# Patient Record
Sex: Male | Born: 1955
Health system: Southern US, Community
[De-identification: ages and names within clinical notes are randomized; demographics above are authoritative.]

## PROBLEM LIST (undated history)

## (undated) ENCOUNTER — Emergency Department (HOSPITAL_COMMUNITY): Admission: EM | Payer: No Typology Code available for payment source

## (undated) DIAGNOSIS — I509 Heart failure, unspecified: Secondary | ICD-10-CM

## (undated) DIAGNOSIS — E785 Hyperlipidemia, unspecified: Secondary | ICD-10-CM

## (undated) DIAGNOSIS — N183 Chronic kidney disease, stage 3 unspecified: Secondary | ICD-10-CM

## (undated) DIAGNOSIS — F1410 Cocaine abuse, uncomplicated: Secondary | ICD-10-CM

## (undated) DIAGNOSIS — Z89519 Acquired absence of unspecified leg below knee: Secondary | ICD-10-CM

## (undated) DIAGNOSIS — Z5900 Homelessness unspecified: Secondary | ICD-10-CM

## (undated) DIAGNOSIS — Z973 Presence of spectacles and contact lenses: Secondary | ICD-10-CM

## (undated) DIAGNOSIS — I639 Cerebral infarction, unspecified: Secondary | ICD-10-CM

## (undated) DIAGNOSIS — Z720 Tobacco use: Secondary | ICD-10-CM

## (undated) DIAGNOSIS — T8781 Dehiscence of amputation stump: Secondary | ICD-10-CM

## (undated) DIAGNOSIS — E119 Type 2 diabetes mellitus without complications: Secondary | ICD-10-CM

## (undated) DIAGNOSIS — H409 Unspecified glaucoma: Secondary | ICD-10-CM

## (undated) DIAGNOSIS — S78119A Complete traumatic amputation at level between unspecified hip and knee, initial encounter: Secondary | ICD-10-CM

## (undated) DIAGNOSIS — F1210 Cannabis abuse, uncomplicated: Secondary | ICD-10-CM

## (undated) DIAGNOSIS — I10 Essential (primary) hypertension: Secondary | ICD-10-CM

## (undated) DIAGNOSIS — I5030 Unspecified diastolic (congestive) heart failure: Secondary | ICD-10-CM

## (undated) DIAGNOSIS — M869 Osteomyelitis, unspecified: Secondary | ICD-10-CM

## (undated) HISTORY — DX: Hyperlipidemia, unspecified: E78.5

## (undated) HISTORY — DX: Unspecified glaucoma: H40.9

## (undated) HISTORY — PX: NO PAST SURGERIES: SHX2092

---

## 1997-11-05 DIAGNOSIS — E119 Type 2 diabetes mellitus without complications: Secondary | ICD-10-CM

## 1997-11-05 HISTORY — DX: Type 2 diabetes mellitus without complications: E11.9

## 2003-11-06 DIAGNOSIS — I10 Essential (primary) hypertension: Secondary | ICD-10-CM

## 2003-11-06 HISTORY — DX: Essential (primary) hypertension: I10

## 2013-11-05 DIAGNOSIS — H409 Unspecified glaucoma: Secondary | ICD-10-CM

## 2013-11-05 HISTORY — DX: Unspecified glaucoma: H40.9

## 2018-09-12 ENCOUNTER — Encounter (HOSPITAL_COMMUNITY): Payer: Self-pay | Admitting: Emergency Medicine

## 2018-09-12 ENCOUNTER — Ambulatory Visit (HOSPITAL_COMMUNITY)
Admission: EM | Admit: 2018-09-12 | Discharge: 2018-09-12 | Disposition: A | Discharge status: Home / Self Care | Payer: Self-pay | Attending: Family Medicine | Admitting: Family Medicine

## 2018-09-12 DIAGNOSIS — I10 Essential (primary) hypertension: Secondary | ICD-10-CM

## 2018-09-12 DIAGNOSIS — E1165 Type 2 diabetes mellitus with hyperglycemia: Secondary | ICD-10-CM

## 2018-09-12 DIAGNOSIS — L03119 Cellulitis of unspecified part of limb: Secondary | ICD-10-CM

## 2018-09-12 DIAGNOSIS — L02419 Cutaneous abscess of limb, unspecified: Secondary | ICD-10-CM

## 2018-09-12 HISTORY — DX: Type 2 diabetes mellitus without complications: E11.9

## 2018-09-12 HISTORY — DX: Essential (primary) hypertension: I10

## 2018-09-12 MED ORDER — ENALAPRIL MALEATE 10 MG PO TABS: 10.0000 mg | ORAL_TABLET | Freq: Every day | ORAL | 1 refills | Status: DC

## 2018-09-12 MED ORDER — DOXYCYCLINE HYCLATE 100 MG PO TABS: 100.0000 mg | ORAL_TABLET | Freq: Two times a day (BID) | ORAL | 0 refills | Status: DC

## 2018-09-12 MED ORDER — DOXYCYCLINE HYCLATE 100 MG PO TABS
ORAL_TABLET | ORAL | Status: AC
  Filled 2018-09-12: qty 1

## 2018-09-12 MED ORDER — METFORMIN HCL 500 MG PO TABS: 500.0000 mg | ORAL_TABLET | Freq: Every day | ORAL | 1 refills | Status: DC

## 2018-09-12 MED ORDER — DOXYCYCLINE HYCLATE 100 MG PO TABS
100.0000 mg | ORAL_TABLET | Freq: Once | ORAL | Status: AC
  Administered 2018-09-12: 100 mg via ORAL

## 2018-09-12 MED ORDER — GLIPIZIDE 5 MG PO TABS: 5.0000 mg | ORAL_TABLET | Freq: Every day | ORAL | 1 refills | Status: DC

## 2018-09-12 NOTE — Discharge Instructions (Addendum)
Wash the leg with soap and water twice a day.  [employer] is agency to help find a job  [employer] on [address] [place] helps people [employer])

## 2018-09-12 NOTE — ED Triage Notes (Signed)
Pt sts insect bite to right leg with increased swelling and pain; pt sts out of DM and htn meds and no PCP

## 2018-09-12 NOTE — ED Provider Notes (Signed)
[facility]    CSN: [id number] Arrival date & time: 09/12/18  1620     History   Chief Complaint Chief Complaint  Patient presents with  . Insect Bite    HPI Scott Greer is a 62 y.o. male.   Initial visit for this 62 yo man.  Pt sts insect bite to right leg with increased swelling and pain; pt sts out of DM and htn meds and no PCP  This 62 year old man was incarcerated and recently discharged from prison.  He only had $45 when he left the prison.  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  He needs medicine for his diabetes and hypertension.  About a week ago he developed an abscess on the lateral aspect of his right thigh and its gotten so that he cannot work because the pain is so great.     Past Medical History:  Diagnosis Date  . Diabetes mellitus without complication (HCC)   . Hypertension     There are no active problems to display for this patient.   History reviewed. No pertinent surgical history.     Home Medications    Prior to Admission medications   Not on File    Family History History reviewed. No pertinent family history.  Social History Social History   Tobacco Use  . Smoking status: Current Every Day Smoker    Types: Cigars  . Smokeless tobacco: Never Used  Substance Use Topics  . Alcohol use: Yes  . Drug use: Not Currently     Allergies   Bee venom   Review of Systems Review of Systems   Physical Exam Triage Vital Signs ED Triage Vitals [09/12/18 1636]  Enc Vitals Group     BP (!) 201/103     Pulse Rate 95     Resp 18     Temp 98.1 F (36.7 C)     Temp Source Oral     SpO2 95 %     Weight      Height      Head Circumference      Peak Flow      Pain Score 6     Pain Loc      Pain Edu?      Excl. in [place]?    No data found.  Updated Vital Signs BP (!) 201/103 (BP Location: Right Arm)   Pulse 95   Temp 98.1 F (36.7 C) (Oral)   Resp 18   SpO2  95%    Physical Exam  Constitutional: He is oriented to person, place, and time. He appears well-developed and well-nourished.  Eyes:  Injected conjunctiva  Neck: Normal range of motion. Neck supple.  Pulmonary/Chest: Effort normal.  Musculoskeletal: Normal range of motion.  Neurological: He is alert and oriented to person, place, and time.  Skin:  2 cm right lateral thigh tender and fluctuant mass  Nursing note and vitals reviewed.    UC Treatments / Results  Labs (all labs ordered are listed, but only abnormal results are displayed) Labs Reviewed - No data to display  EKG None  Radiology No results found.  Procedures Incision and Drainage Date/Time: 09/12/2018 5:34 PM Performed by: Elvina Sidle, MD Authorized by: Elvina Sidle, MD   Consent:    Consent obtained:  Verbal   Consent given by:  Patient   Risks discussed:  Infection   Alternatives discussed:  Delayed treatment Location:  Type:  Abscess   Location:  Lower extremity   Lower extremity location:  Leg   Leg location:  R upper leg Pre-procedure details:    Skin preparation:  Betadine Anesthesia (see MAR for exact dosages):    Anesthesia method:  Local infiltration   Local anesthetic:  Lidocaine 2% w/o epi Procedure type:    Complexity:  Simple Procedure details:    Needle aspiration: no     Incision types:  Stab incision   Incision depth:  Subcutaneous   Scalpel blade:  11   Wound management:  Probed and deloculated   Drainage:  Purulent   Drainage amount:  Moderate   Wound treatment:  Wound left open   Packing materials:  None Post-procedure details:    Patient tolerance of procedure:  Tolerated well, no immediate complications   (including critical care time)  Medications Ordered in UC Medications - No data to display  Initial Impression / Assessment and Plan / UC Course  I have reviewed the triage vital signs and the nursing notes.  Pertinent labs & imaging results that were  available during my care of the patient were reviewed by me and considered in my medical decision making (see chart for details).    Final Clinical Impressions(s) / UC Diagnoses   Final diagnoses:  None   Discharge Instructions   None    ED Prescriptions    None     Controlled Substance Prescriptions [place] Controlled Substance Registry consulted? Not Applicable   Elvina Sidle, MD 09/12/18 1736

## 2018-09-22 ENCOUNTER — Ambulatory Visit: Payer: Self-pay | Admitting: Internal Medicine

## 2018-09-23 ENCOUNTER — Ambulatory Visit: Payer: Self-pay | Admitting: Internal Medicine

## 2018-09-23 ENCOUNTER — Encounter: Payer: Self-pay | Admitting: Internal Medicine

## 2018-09-23 VITALS — BP 180/98 | HR 88 | Resp 12 | Ht 72.0 in | Wt 234.0 lb

## 2018-09-23 DIAGNOSIS — E785 Hyperlipidemia, unspecified: Secondary | ICD-10-CM | POA: Insufficient documentation

## 2018-09-23 DIAGNOSIS — Z9189 Other specified personal risk factors, not elsewhere classified: Secondary | ICD-10-CM

## 2018-09-23 DIAGNOSIS — I10 Essential (primary) hypertension: Secondary | ICD-10-CM

## 2018-09-23 DIAGNOSIS — E1169 Type 2 diabetes mellitus with other specified complication: Secondary | ICD-10-CM

## 2018-09-23 DIAGNOSIS — Z23 Encounter for immunization: Secondary | ICD-10-CM

## 2018-09-23 DIAGNOSIS — H409 Unspecified glaucoma: Secondary | ICD-10-CM | POA: Insufficient documentation

## 2018-09-23 DIAGNOSIS — E782 Mixed hyperlipidemia: Secondary | ICD-10-CM

## 2018-09-23 DIAGNOSIS — E119 Type 2 diabetes mellitus without complications: Secondary | ICD-10-CM

## 2018-09-23 LAB — GLUCOSE, POCT (MANUAL RESULT ENTRY): POC Glucose: 261 mg/dl — AB (ref 70–99)

## 2018-09-23 MED ORDER — GLUCOSE BLOOD VI STRP: ORAL_STRIP | 12 refills | Status: DC

## 2018-09-23 MED ORDER — AGAMATRIX ULTRA-THIN LANCETS MISC: 11 refills | Status: DC

## 2018-09-23 MED ORDER — AGAMATRIX PRESTO W/DEVICE KIT: PACK | 0 refills | Status: DC

## 2018-09-23 MED ORDER — GLIPIZIDE 5 MG PO TABS: ORAL_TABLET | ORAL | 11 refills | Status: DC

## 2018-09-23 MED ORDER — LISINOPRIL 10 MG PO TABS: 10.0000 mg | ORAL_TABLET | Freq: Every day | ORAL | 11 refills | Status: DC

## 2018-09-23 MED ORDER — METFORMIN HCL 500 MG PO TABS: ORAL_TABLET | ORAL | 11 refills | Status: DC

## 2018-09-23 NOTE — Patient Instructions (Addendum)
Drink a glass of water before every meal Drink 6-8 glasses of water daily Eat three meals daily Eat a protein and healthy fat with every meal (eggs,fish, chicken, [place] and limit red meats) Eat 5 servings of vegetables daily, mix the colors Eat 2 servings of fruit daily with skin, if skin is edible Use smaller plates Put food/utensils down as you chew and swallow each bite Eat at a table with friends/family at least once daily, no TV Do not eat in front of the TV  Recent studies show that people who consume all of their calories in a 12 hour period lose weight more efficiently.  For example, if you eat your first meal at 7:00 a.m., your last meal of the day should be completed by 7:00 p.m.  Call in Glaucoma medication tomorrow or Thursday

## 2018-09-23 NOTE — Progress Notes (Signed)
Subjective:    Patient ID: Scott Greer, male    DOB: 03-05-1956, 62 y.o.   MRN: [id number]  HPI   Here to establish  1.  Abscess on posterior right thigh I & Ded in Urgent Care on 09/12/2018. He was taking Doxycycline until about 2 days ago.  Thinks he was bit by a spider and became secondarily infected.  States he is healing well.    2. DM:  Diagnosed in 1999.  Took Metformin and Glipizide in prison.  He is not certain what his sugar control was when in prison.   Denies any complication from the diabetes, though may have developed peripheral neuropathy.  3.  Hypertension:  Diagnosed a few years after diagnosed with DM.  Was taking Enalapril 10 mg daily and feels his bp is under control.    4.  Hypercholesterolemia:  Was on medication in the past.  Thinks he was on Lipitor.  States they took him off as it caused him to urinate too frequently.  Has been off all meds for 2 months.   5.  ED:  Has been out of prison for 2 months.  He states he has had difficulty with erection.  Unable to get him to clarify how affected his has been.  He has been able to ejaculate..  Current Meds  Medication Sig  . glipiZIDE (GLUCOTROL) 5 MG tablet 1/2 tab by mouth twice daily with meals  . metFORMIN (GLUCOPHAGE) 500 MG tablet 1 tab by mouth twice daily with meals  . [DISCONTINUED] doxycycline (VIBRA-TABS) 100 MG tablet Take 1 tablet (100 mg total) by mouth 2 (two) times daily.  . [DISCONTINUED] glipiZIDE (GLUCOTROL) 5 MG tablet Take 1 tablet (5 mg total) by mouth daily before breakfast.  . [DISCONTINUED] metFORMIN (GLUCOPHAGE) 500 MG tablet Take 1 tablet (500 mg total) by mouth at bedtime for 14 days.   Allergies  Allergen Reactions  . Bee Venom     Past Medical History:  Diagnosis Date  . Diabetes mellitus without complication (HCC) 1999  . Glaucoma 2015  . Hyperlipidemia   . Hypertension 2005    History reviewed. No pertinent surgical history.   Family History  Problem Relation Age of  Onset  . Stroke Mother   . Diabetes Mother        Toward end of life    Social History   Socioeconomic History  . Marital status: Married    Spouse name: [name]  . Number of children: 2  . Years of education: Not on file  . Highest education level: Associate degree: occupational, Scientist, product/process development, or vocational program  Occupational History  . Not on file  Social Needs  . Financial resource strain: Not on file  . Food insecurity:    Worry: Not on file    Inability: Not on file  . Transportation needs:    Medical: Not on file    Non-medical: Not on file  Tobacco Use  . Smoking status: Current Every Day Smoker    Types: Cigars  . Smokeless tobacco: Current User    Types: Chew  . Tobacco comment: 2 Black and mild daily.  Quit cigarettes in 2007  Substance and Sexual Activity  . Alcohol use: Yes    Alcohol/week: 6.0 standard drinks    Types: 6 Cans of beer per week  . Drug use: Not Currently    Types: Marijuana  . Sexual activity: Yes  Lifestyle  . Physical activity:    Days per week: Not  on file    Minutes per session: Not on file  . Stress: Not on file  Relationships  . Social connections:    Talks on phone: Not on file    Gets together: Not on file    Attends religious service: Not on file    Active member of club or organization: Not on file    Attends meetings of clubs or organizations: Not on file    Relationship status: Not on file  . Intimate partner violence:    Fear of current or ex partner: Not on file    Emotionally abused: Not on file    Physically abused: Not on file    Forced sexual activity: Not on file  Other Topics Concern  . Not on file  Social History Narrative   Out of prison for 2 months.   Lives at home with his wife.    Review of Systems     Objective:   Physical Exam NAD HEENT:  PERRL, EOMI, Pingueculae bilaterally both nasal and temporal Discs difficult to see.  TMs pearly gray, throat without injection.  No swelling of gingivae  about teeth.   Neck:  Supple, No adenopathy, no thyromegaly. Chest:  CTA CV:  RRR with normal S1 and S2, No S3, S4 or murmur.  Radial and DP pulses normal and equal Abd:  S, NT, No HSM or mass, + BS. LE:  No edema.       Assessment & Plan:  1.  DM:  Rx for glucometer and monitoring equipment. Check sugars twice daily.   Metformin 500 mg and Glipizide 5 mg both twice daily with meals. Influenza vaccine today. Eye exam referral  2.  Need for Dental care:  Dental referral  3.  Glaucoma of left eye:  Eye referral as above.  4.  Hypertension:  Lisinopril 10 mg daily.  5.  Hyperlipidemia:  Fasting labs in 2 days:  FLP, A1C, CMP, CBC.    6.  ?ED:  Hold on treatment of possible ED until stable with DM

## 2018-09-24 ENCOUNTER — Ambulatory Visit (INDEPENDENT_AMBULATORY_CARE_PROVIDER_SITE_OTHER): Payer: Self-pay | Admitting: Physician Assistant

## 2018-09-24 ENCOUNTER — Ambulatory Visit: Payer: Self-pay | Admitting: Internal Medicine

## 2018-09-25 ENCOUNTER — Other Ambulatory Visit: Payer: Self-pay

## 2018-10-13 ENCOUNTER — Other Ambulatory Visit: Payer: Self-pay

## 2018-12-19 ENCOUNTER — Other Ambulatory Visit: Payer: Self-pay

## 2018-12-23 ENCOUNTER — Ambulatory Visit: Payer: Self-pay | Admitting: Internal Medicine

## 2019-01-11 ENCOUNTER — Encounter (HOSPITAL_COMMUNITY): Payer: Self-pay | Admitting: *Deleted

## 2019-01-11 ENCOUNTER — Telehealth (HOSPITAL_COMMUNITY): Payer: Self-pay

## 2019-01-11 ENCOUNTER — Ambulatory Visit (HOSPITAL_COMMUNITY)
Admission: EM | Admit: 2019-01-11 | Discharge: 2019-01-11 | Disposition: A | Discharge status: Home / Self Care | Payer: Self-pay | Attending: Family Medicine | Admitting: Family Medicine

## 2019-01-11 DIAGNOSIS — L02412 Cutaneous abscess of left axilla: Secondary | ICD-10-CM

## 2019-01-11 MED ORDER — LISINOPRIL 10 MG PO TABS: 10.0000 mg | ORAL_TABLET | Freq: Every day | ORAL | 11 refills | Status: DC

## 2019-01-11 MED ORDER — METFORMIN HCL 500 MG PO TABS: ORAL_TABLET | ORAL | 11 refills | Status: DC

## 2019-01-11 MED ORDER — DOXYCYCLINE HYCLATE 100 MG PO TABS: 100.0000 mg | ORAL_TABLET | Freq: Two times a day (BID) | ORAL | 0 refills | Status: DC

## 2019-01-11 MED ORDER — HYDROCODONE-ACETAMINOPHEN 5-325 MG PO TABS: 1.0000 | ORAL_TABLET | Freq: Four times a day (QID) | ORAL | 0 refills | Status: DC | PRN

## 2019-01-11 MED ORDER — GLIPIZIDE 5 MG PO TABS: ORAL_TABLET | ORAL | 11 refills | Status: DC

## 2019-01-11 NOTE — ED Provider Notes (Signed)
[facility]    CSN: [id number] Arrival date & time: 01/11/19  1236     History   Chief Complaint Chief Complaint  Patient presents with  . Abscess    HPI Scott Greer is a 63 y.o. male.   C/O abscess to bilat axilla x approx 2 wks after using a new deoderant that caused irritation.  Denies fevers.     Past Medical History:  Diagnosis Date  . Diabetes mellitus without complication ([place]) 8250  . Glaucoma 2015  . Hyperlipidemia   . Hypertension 2005    Patient Active Problem List   Diagnosis Date Noted  . Glaucoma   . Hyperlipidemia   . Hypertension 11/06/2003  . Diabetes mellitus without complication ([place]) 53/97/6734    History reviewed. No pertinent surgical history.     Home Medications    Prior to Admission medications   Medication Sig Start Date End Date Taking? Authorizing Provider  AGAMATRIX ULTRA-THIN LANCETS MISC Check blood glucose twice daily before meals. 09/23/18   Mack Hook, MD  Blood Glucose Monitoring Suppl (AGAMATRIX PRESTO) w/Device KIT Check blood glucose twice daily before meals. 09/23/18   Mack Hook, MD  doxycycline (VIBRA-TABS) 100 MG tablet Take 1 tablet (100 mg total) by mouth 2 (two) times daily. 01/11/19   Robyn Haber, MD  glipiZIDE (GLUCOTROL) 5 MG tablet 1/2 tab by mouth twice daily with meals 01/11/19   Robyn Haber, MD  glucose blood (AGAMATRIX PRESTO TEST) test strip Check blood glucose twice daily before meals 09/23/18   Mack Hook, MD  HYDROcodone-acetaminophen (NORCO) 5-325 MG tablet Take 1 tablet by mouth every 6 (six) hours as needed for moderate pain. 01/11/19   Robyn Haber, MD  lisinopril (PRINIVIL,ZESTRIL) 10 MG tablet Take 1 tablet (10 mg total) by mouth daily. 01/11/19   Robyn Haber, MD  metFORMIN (GLUCOPHAGE) 500 MG tablet 1 tab by mouth twice daily with meals 01/11/19   Robyn Haber, MD    Family History Family History  Problem Relation Age of Onset  . Stroke  Mother   . Diabetes Mother        Toward end of life    Social History Social History   Tobacco Use  . Smoking status: Former Smoker    Types: Cigars  . Smokeless tobacco: Current User    Types: Chew  . Tobacco comment: 2 Black and mild daily.  Quit cigarettes in 2007  Substance Use Topics  . Alcohol use: Yes    Comment: pint weekly  . Drug use: Not Currently    Types: Marijuana     Allergies   Bee venom   Review of Systems Review of Systems  Skin:       abscess     Physical Exam Triage Vital Signs ED Triage Vitals  Enc Vitals Group     BP 01/11/19 1355 (!) 152/87     Pulse Rate 01/11/19 1355 76     Resp 01/11/19 1355 16     Temp 01/11/19 1355 98.9 F (37.2 C)     Temp Source 01/11/19 1355 Oral     SpO2 01/11/19 1355 99 %     Weight --      Height --      Head Circumference --      Peak Flow --      Pain Score 01/11/19 1356 5     Pain Loc --      Pain Edu? --      Excl. in [place]? --  No data found.  Updated Vital Signs BP (!) 152/87   Pulse 76   Temp 98.9 F (37.2 C) (Oral)   Resp 16   SpO2 99%    Physical Exam Vitals signs and nursing note reviewed.  Constitutional:      Appearance: Normal appearance. He is not ill-appearing.  Neck:     Musculoskeletal: Neck supple.  Cardiovascular:     Rate and Rhythm: Normal rate and regular rhythm.  Pulmonary:     Effort: Pulmonary effort is normal.  Skin:    Findings: Abscess present.     Comments: Left axilla  Neurological:     Mental Status: He is alert and oriented to person, place, and time.      UC Treatments / Results  Labs (all labs ordered are listed, but only abnormal results are displayed) Labs Reviewed - No data to display  EKG None  Radiology No results found.  Procedures Incision and Drainage Date/Time: 01/11/2019 2:29 PM Performed by: Robyn Haber, MD Authorized by: Robyn Haber, MD   Consent:    Consent obtained:  Verbal   Consent given by:  Patient   Risks  discussed:  Bleeding, incomplete drainage and pain   Alternatives discussed:  No treatment Location:    Type:  Abscess   Location: left axilla. Pre-procedure details:    Skin preparation:  Antiseptic wash Anesthesia (see MAR for exact dosages):    Anesthesia method:  Local infiltration   Local anesthetic:  Lidocaine 2% WITH epi Procedure type:    Complexity:  Complex Procedure details:    Needle aspiration: no     Incision types:  Stab incision   Incision depth:  Submucosal   Scalpel blade:  11   Wound management:  Probed and deloculated   Drainage:  Bloody and purulent   Drainage amount:  Scant   Wound treatment:  Wound left open   Packing materials:  1/4 in gauze   Amount 1/4":  6 cm Post-procedure details:    Patient tolerance of procedure:  Tolerated well, no immediate complications   (including critical care time)  Medications Ordered in UC Medications - No data to display  Initial Impression / Assessment and Plan / UC Course  I have reviewed the triage vital signs and the nursing notes.  Pertinent labs & imaging results that were available during my care of the patient were reviewed by me and considered in my medical decision making (see chart for details).    Final Clinical Impressions(s) / UC Diagnoses   Final diagnoses:  Abscess of left axilla     Discharge Instructions     Return in 2 days to recheck the wound      ED Prescriptions    Medication Sig Dispense Auth. Provider   glipiZIDE (GLUCOTROL) 5 MG tablet 1/2 tab by mouth twice daily with meals 30 tablet Robyn Haber, MD   lisinopril (PRINIVIL,ZESTRIL) 10 MG tablet Take 1 tablet (10 mg total) by mouth daily. 30 tablet Robyn Haber, MD   metFORMIN (GLUCOPHAGE) 500 MG tablet 1 tab by mouth twice daily with meals 60 tablet Robyn Haber, MD   HYDROcodone-acetaminophen (NORCO) 5-325 MG tablet Take 1 tablet by mouth every 6 (six) hours as needed for moderate pain. 12 tablet Robyn Haber,  MD   doxycycline (VIBRA-TABS) 100 MG tablet Take 1 tablet (100 mg total) by mouth 2 (two) times daily. 20 tablet Robyn Haber, MD     Controlled Substance Prescriptions [place] Controlled Substance Registry consulted? Not Applicable  Robyn Haber, MD 01/11/19 1436

## 2019-01-11 NOTE — ED Triage Notes (Signed)
C/O abscess to bilat axilla x approx 2 wks after using a new deoderant that caused irritation.  Denies fevers.

## 2019-01-11 NOTE — Discharge Instructions (Addendum)
Return in 2 days to recheck the wound

## 2019-01-13 ENCOUNTER — Encounter (HOSPITAL_COMMUNITY): Payer: Self-pay | Admitting: Emergency Medicine

## 2019-01-13 ENCOUNTER — Ambulatory Visit (HOSPITAL_COMMUNITY)
Admission: EM | Admit: 2019-01-13 | Discharge: 2019-01-13 | Disposition: A | Discharge status: Home / Self Care | Payer: Self-pay | Attending: Family Medicine | Admitting: Family Medicine

## 2019-01-13 DIAGNOSIS — Z5189 Encounter for other specified aftercare: Secondary | ICD-10-CM

## 2019-01-13 DIAGNOSIS — L02412 Cutaneous abscess of left axilla: Secondary | ICD-10-CM

## 2019-01-13 NOTE — ED Triage Notes (Signed)
Pt states he had a cyst I/Ded two days ago and was told to come back for wound check and possible packing removal.

## 2019-01-13 NOTE — ED Provider Notes (Signed)
  [facility]   [id number] 01/13/19 Arrival Time: 1755  ASSESSMENT & PLAN:  1. Abscess re-check    Packing removed. Wound looks good. Expect healing without complication. Continue current wound care. May f/u as needed.  Reviewed expectations re: course of current medical issues. Questions answered. Outlined signs and symptoms indicating need for more acute intervention. Patient verbalized understanding. After Visit Summary given.   SUBJECTIVE:  Scott Greer is a 63 y.o. male who presents for a recheck s/p I&D of L axilla abscess. No significant pain. Afebrile.  ROS: As per HPI.  OBJECTIVE:  Vitals:   01/13/19 1817 01/13/19 1818  BP:  (!) 185/104  Pulse: 78   Resp: 16   Temp: 98.7 F (37.1 C)   SpO2: 99%      General appearance: alert; no distress Skin: wound of L axilla healing; no active drainage or bleeding Psychological: alert and cooperative; normal mood and affect  Allergies  Allergen Reactions  . Bee Venom     Past Medical History:  Diagnosis Date  . Diabetes mellitus without complication (HCC) 1999  . Glaucoma 2015  . Hyperlipidemia   . Hypertension 2005   Social History   Socioeconomic History  . Marital status: Married    Spouse name: [name]  . Number of children: 2  . Years of education: Not on file  . Highest education level: Associate degree: occupational, Scientist, product/process development, or vocational program  Occupational History  . Not on file  Social Needs  . Financial resource strain: Not on file  . Food insecurity:    Worry: Not on file    Inability: Not on file  . Transportation needs:    Medical: Not on file    Non-medical: Not on file  Tobacco Use  . Smoking status: Former Smoker    Types: Cigars  . Smokeless tobacco: Current User    Types: Chew  . Tobacco comment: 2 Black and mild daily.  Quit cigarettes in 2007  Substance and Sexual Activity  . Alcohol use: Yes    Comment: pint weekly  . Drug use: Not Currently    Types:  Marijuana  . Sexual activity: Not on file  Lifestyle  . Physical activity:    Days per week: Not on file    Minutes per session: Not on file  . Stress: Not on file  Relationships  . Social connections:    Talks on phone: Not on file    Gets together: Not on file    Attends religious service: Not on file    Active member of club or organization: Not on file    Attends meetings of clubs or organizations: Not on file    Relationship status: Not on file  Other Topics Concern  . Not on file  Social History Narrative   Out of prison for 2 months.   Lives at home with his wife.   Family History  Problem Relation Age of Onset  . Stroke Mother   . Diabetes Mother        Toward end of life   History reviewed. No pertinent surgical history.         Mardella Layman, MD 01/13/19 [id number]

## 2019-01-22 ENCOUNTER — Ambulatory Visit: Payer: Self-pay | Admitting: Internal Medicine

## 2019-03-06 ENCOUNTER — Ambulatory Visit: Payer: Self-pay | Admitting: Internal Medicine

## 2019-03-09 ENCOUNTER — Telehealth: Payer: Self-pay | Admitting: Internal Medicine

## 2019-03-09 ENCOUNTER — Ambulatory Visit: Payer: Self-pay | Admitting: Internal Medicine

## 2019-03-09 ENCOUNTER — Other Ambulatory Visit: Payer: Self-pay

## 2019-03-09 NOTE — Telephone Encounter (Signed)
Patient scheduled to be seen today and showed up without the co-pay was supposed to bring for this ov. Patient got angry when was told needed  to pay at least half of the co-pay and has previous balance and multiples NO SHOW appointments.  Patient declined to rescheduled this appointment a stated will not be coming to this facility again.

## 2019-05-14 ENCOUNTER — Other Ambulatory Visit: Payer: Self-pay

## 2019-05-14 ENCOUNTER — Encounter (HOSPITAL_COMMUNITY): Payer: Self-pay

## 2019-05-14 ENCOUNTER — Emergency Department (HOSPITAL_COMMUNITY): Payer: Self-pay

## 2019-05-14 ENCOUNTER — Ambulatory Visit (HOSPITAL_COMMUNITY)
Admission: EM | Admit: 2019-05-14 | Discharge: 2019-05-14 | Disposition: A | Discharge status: Home / Self Care | Payer: Self-pay

## 2019-05-14 ENCOUNTER — Encounter (HOSPITAL_COMMUNITY): Payer: Self-pay | Admitting: Emergency Medicine

## 2019-05-14 ENCOUNTER — Emergency Department (HOSPITAL_COMMUNITY)
Admission: EM | Admit: 2019-05-14 | Discharge: 2019-05-15 | Discharge status: Against Medical Advice | Payer: Self-pay | Attending: Emergency Medicine | Admitting: Emergency Medicine

## 2019-05-14 DIAGNOSIS — Y9289 Other specified places as the place of occurrence of the external cause: Secondary | ICD-10-CM | POA: Insufficient documentation

## 2019-05-14 DIAGNOSIS — E119 Type 2 diabetes mellitus without complications: Secondary | ICD-10-CM | POA: Insufficient documentation

## 2019-05-14 DIAGNOSIS — I10 Essential (primary) hypertension: Secondary | ICD-10-CM | POA: Insufficient documentation

## 2019-05-14 DIAGNOSIS — Z87891 Personal history of nicotine dependence: Secondary | ICD-10-CM | POA: Insufficient documentation

## 2019-05-14 DIAGNOSIS — Z5329 Procedure and treatment not carried out because of patient's decision for other reasons: Secondary | ICD-10-CM | POA: Insufficient documentation

## 2019-05-14 DIAGNOSIS — L97529 Non-pressure chronic ulcer of other part of left foot with unspecified severity: Secondary | ICD-10-CM | POA: Insufficient documentation

## 2019-05-14 DIAGNOSIS — Z7984 Long term (current) use of oral hypoglycemic drugs: Secondary | ICD-10-CM | POA: Insufficient documentation

## 2019-05-14 DIAGNOSIS — Y999 Unspecified external cause status: Secondary | ICD-10-CM | POA: Insufficient documentation

## 2019-05-14 DIAGNOSIS — S91302A Unspecified open wound, left foot, initial encounter: Secondary | ICD-10-CM

## 2019-05-14 DIAGNOSIS — Y9389 Activity, other specified: Secondary | ICD-10-CM | POA: Insufficient documentation

## 2019-05-14 DIAGNOSIS — Z79899 Other long term (current) drug therapy: Secondary | ICD-10-CM | POA: Insufficient documentation

## 2019-05-14 DIAGNOSIS — X58XXXA Exposure to other specified factors, initial encounter: Secondary | ICD-10-CM | POA: Insufficient documentation

## 2019-05-14 LAB — CBC WITH DIFFERENTIAL/PLATELET
Abs Immature Granulocytes: 0.01 10*3/uL (ref 0.00–0.07)
Basophils Absolute: 0 10*3/uL (ref 0.0–0.1)
Basophils Relative: 0 %
Eosinophils Absolute: 0.1 10*3/uL (ref 0.0–0.5)
Eosinophils Relative: 1 %
HCT: 39.3 % (ref 39.0–52.0)
Hemoglobin: 12.7 g/dL — ABNORMAL LOW (ref 13.0–17.0)
Immature Granulocytes: 0 %
Lymphocytes Relative: 19 %
Lymphs Abs: 1.1 10*3/uL (ref 0.7–4.0)
MCH: 29.3 pg (ref 26.0–34.0)
MCHC: 32.3 g/dL (ref 30.0–36.0)
MCV: 90.8 fL (ref 80.0–100.0)
Monocytes Absolute: 0.6 10*3/uL (ref 0.1–1.0)
Monocytes Relative: 10 %
Neutro Abs: 4.2 10*3/uL (ref 1.7–7.7)
Neutrophils Relative %: 70 %
Platelets: 236 10*3/uL (ref 150–400)
RBC: 4.33 MIL/uL (ref 4.22–5.81)
RDW: 14.1 % (ref 11.5–15.5)
WBC: 6 10*3/uL (ref 4.0–10.5)
nRBC: 0 % (ref 0.0–0.2)

## 2019-05-14 LAB — LACTIC ACID, PLASMA: Lactic Acid, Venous: 0.7 mmol/L (ref 0.5–1.9)

## 2019-05-14 LAB — COMPREHENSIVE METABOLIC PANEL
ALT: 16 U/L (ref 0–44)
AST: 13 U/L — ABNORMAL LOW (ref 15–41)
Albumin: 3.6 g/dL (ref 3.5–5.0)
Alkaline Phosphatase: 58 U/L (ref 38–126)
Anion gap: 10 (ref 5–15)
BUN: 15 mg/dL (ref 6–20)
CO2: 25 mmol/L (ref 22–32)
Calcium: 9.3 mg/dL (ref 8.9–10.3)
Chloride: 102 mmol/L (ref 98–111)
Creatinine, Ser: 1.21 mg/dL (ref 0.61–1.24)
GFR calc Af Amer: >60 mL/min (ref >60)
GFR calc non Af Amer: >60 mL/min (ref >60)
Glucose, Bld: 265 mg/dL — ABNORMAL HIGH (ref 70–99)
Potassium: 3.7 mmol/L (ref 3.5–5.1)
Sodium: 137 mmol/L (ref 135–145)
Total Bilirubin: 0.8 mg/dL (ref 0.3–1.2)
Total Protein: 6.6 g/dL (ref 6.5–8.1)

## 2019-05-14 MED ORDER — SODIUM CHLORIDE 0.9% FLUSH: 3.0000 mL | Freq: Once | INTRAVENOUS | Status: DC

## 2019-05-14 NOTE — ED Triage Notes (Signed)
Pt states he had a blister on his toe and pulled off the skin and he states that the skin is peeling off a lot. Pt has diabetes. Large ulcer noted to L big toe.

## 2019-05-14 NOTE — ED Notes (Signed)
Pt refusing assessment and vitals by this RN. States that he "just wants to leave because I've been waiting for 5 hours." Informed pt MD will be in see him shortly. Pt deciding to wait for now but does still not want any interventions done

## 2019-05-14 NOTE — Discharge Instructions (Signed)
63 year old male with history of diabetes presenting for left great toe wound.  Due to decreased sensation, extensiveness of wound, risk for comorbidities, and concern for lack of compliance for outpatient regimen, patient was referred to ER for further eval/management.

## 2019-05-14 NOTE — ED Provider Notes (Signed)
[place]    CSN: [id number] Arrival date & time: 05/14/19  1847     History   Chief Complaint Chief Complaint  Patient presents with  . Toe Pain    HPI Scott Greer is a 63 y.o. male with history of diabetes presenting for left great toe wound.  Patient states that he noticed on Saturday.  Has been using peroxide daily, and "picking at it ".  Patient states it started as a hangnail which he used some home device, seemingly pliers.  Patient denies bleeding, states "sometimes feels out of it"; denies malodor.  Patient denies pain, tingling.  Endorses difficulty ambulating.  Of note, patient's blood pressure is elevated: Denies chest pain, shortness of breath, change in vision abdominal pain.   Past Medical History:  Diagnosis Date  . Diabetes mellitus without complication ([place]) 2951  . Glaucoma 2015  . Hyperlipidemia   . Hypertension 2005    Patient Active Problem List   Diagnosis Date Noted  . Glaucoma   . Hyperlipidemia   . Hypertension 11/06/2003  . Diabetes mellitus without complication ([place]) 88/41/6606    History reviewed. No pertinent surgical history.     Home Medications    Prior to Admission medications   Medication Sig Start Date End Date Taking? Authorizing Provider  AGAMATRIX ULTRA-THIN LANCETS MISC Check blood glucose twice daily before meals. 09/23/18   Mack Hook, MD  Blood Glucose Monitoring Suppl (AGAMATRIX PRESTO) w/Device KIT Check blood glucose twice daily before meals. 09/23/18   Mack Hook, MD  glipiZIDE (GLUCOTROL) 5 MG tablet 1/2 tab by mouth twice daily with meals 01/11/19   Robyn Haber, MD  glucose blood (AGAMATRIX PRESTO TEST) test strip Check blood glucose twice daily before meals 09/23/18   Mack Hook, MD  lisinopril (PRINIVIL,ZESTRIL) 10 MG tablet Take 1 tablet (10 mg total) by mouth daily. 01/11/19   Robyn Haber, MD  metFORMIN (GLUCOPHAGE) 500 MG tablet 1 tab by mouth twice daily with meals  01/11/19   Robyn Haber, MD    Family History Family History  Problem Relation Age of Onset  . Stroke Mother   . Diabetes Mother        Toward end of life    Social History Social History   Tobacco Use  . Smoking status: Former Smoker    Types: Cigars  . Smokeless tobacco: Current User    Types: Chew  . Tobacco comment: 2 Black and mild daily.  Quit cigarettes in 2007  Substance Use Topics  . Alcohol use: Yes    Comment: pint weekly  . Drug use: Not Currently    Types: Marijuana     Allergies   Bee venom   Review of Systems As per HPI   Physical Exam Triage Vital Signs ED Triage Vitals  Enc Vitals Group     BP 05/14/19 1920 (!) 170/101     Pulse Rate 05/14/19 1920 75     Resp 05/14/19 1920 16     Temp 05/14/19 1920 99.1 F (37.3 C)     Temp src --      SpO2 05/14/19 1920 98 %     Weight --      Height --      Head Circumference --      Peak Flow --      Pain Score 05/14/19 1922 0     Pain Loc --      Pain Edu? --      Excl. in [place]? --  No data found.  Updated Vital Signs BP (!) 170/101   Pulse 75   Temp 99.1 F (37.3 C)   Resp 16   SpO2 98%   Visual Acuity Right Eye Distance:   Left Eye Distance:   Bilateral Distance:    Right Eye Near:   Left Eye Near:    Bilateral Near:     Physical Exam Constitutional:      General: He is not in acute distress. HENT:     Head: Normocephalic and atraumatic.  Eyes:     General: No scleral icterus.    Pupils: Pupils are equal, round, and reactive to light.  Cardiovascular:     Rate and Rhythm: Normal rate.  Pulmonary:     Effort: Pulmonary effort is normal.  Skin:    Coloration: Skin is not jaundiced or pale.  Neurological:     Mental Status: He is alert and oriented to person, place, and time.      UC Treatments / Results  Labs (all labs ordered are listed, but only abnormal results are displayed) Labs Reviewed - No data to display  EKG   Radiology No results found.   Procedures Procedures (including critical care time)  Medications Ordered in UC Medications - No data to display  Initial Impression / Assessment and Plan / UC Course  I have reviewed the triage vital signs and the nursing notes.  Pertinent labs & imaging results that were available during my care of the patient were reviewed by me and considered in my medical decision making (see chart for details).     63 year old male with history of diabetes presenting for left great toe wound.  Concerning for complicated diabetic foot ulcer.  Due to elevated temperature, hypertension, extent of wound, comorbidities patient referred to ER for further evaluation and management.  Patient is agreeable to plan. Final Clinical Impressions(s) / UC Diagnoses   Final diagnoses:  Open wound of left foot with complication, initial encounter     Discharge Instructions     63 year old male with history of diabetes presenting for left great toe wound.  Due to decreased sensation, extensiveness of wound, risk for comorbidities, and concern for lack of compliance for outpatient regimen, patient was referred to ER for further eval/management.    ED Prescriptions    None     Controlled Substance Prescriptions [place] Controlled Substance Registry consulted? Not Applicable   Quincy Sheehan, [place] 05/14/19 2023

## 2019-05-14 NOTE — ED Triage Notes (Signed)
Patient reports he had a hangnail on his right great toe and he pulled it. Large amount of skin missing on the toe. No purulent drainage noted. Hx of diabetes. Pt states he has been using bleach to clean the toe.

## 2019-05-15 LAB — LACTIC ACID, PLASMA: Lactic Acid, Venous: 0.6 mmol/L (ref 0.5–1.9)

## 2019-05-15 MED ORDER — SULFAMETHOXAZOLE-TRIMETHOPRIM 800-160 MG PO TABS
1.0000 | ORAL_TABLET | Freq: Once | ORAL | Status: AC
  Administered 2019-05-15: 1 via ORAL
  Filled 2019-05-15: qty 1

## 2019-05-15 MED ORDER — SULFAMETHOXAZOLE-TRIMETHOPRIM 800-160 MG PO TABS: 1.0000 | ORAL_TABLET | Freq: Two times a day (BID) | ORAL | 0 refills | Status: AC

## 2019-05-15 MED ORDER — AMOXICILLIN-POT CLAVULANATE 875-125 MG PO TABS: 1.0000 | ORAL_TABLET | Freq: Two times a day (BID) | ORAL | 0 refills | Status: DC

## 2019-05-15 MED ORDER — AMOXICILLIN-POT CLAVULANATE 875-125 MG PO TABS
1.0000 | ORAL_TABLET | Freq: Once | ORAL | Status: AC
  Administered 2019-05-15: 1 via ORAL
  Filled 2019-05-15: qty 1

## 2019-05-15 NOTE — ED Notes (Signed)
Pt leaving AMA, still refusing vitals and to sign AMA form. Informed MD and gave pt discharge paperwork.

## 2019-05-15 NOTE — ED Notes (Signed)
Pt refusing temp and BP vitals. Educated and requested vitals but pt stated he just wanted to go home. Accepted PO pills

## 2019-05-15 NOTE — ED Provider Notes (Signed)
[facility] Provider Note   CSN: [id number] Arrival date & time: 05/14/19  1950     History   Chief Complaint Chief Complaint  Patient presents with  . Toe Injury  . Wound Infection    HPI Scott Greer is a 63 y.o. male.     Patient with history of diabetes and hypertension presenting with left great toe infection and injury.  States he developed a "blister" to his left great toe 4 days ago and pulled this off himself along with a "hangnail".  Since then he has developed increasing pain, redness and swelling to his left great toe.  He has been using peroxide as well as Epsom salts at home and picking at it.  He denies any bleeding or drainage.  He does have some pain with ambulation.  Denies any fevers, chills, nausea or vomiting.  No chest pain or shortness of breath.  He was sent by urgent care for further evaluation.  The history is provided by the patient.    Past Medical History:  Diagnosis Date  . Diabetes mellitus without complication ([place]) 3299  . Glaucoma 2015  . Hyperlipidemia   . Hypertension 2005    Patient Active Problem List   Diagnosis Date Noted  . Glaucoma   . Hyperlipidemia   . Hypertension 11/06/2003  . Diabetes mellitus without complication ([place]) 24/26/8341    History reviewed. No pertinent surgical history.      Home Medications    Prior to Admission medications   Medication Sig Start Date End Date Taking? Authorizing Provider  AGAMATRIX ULTRA-THIN LANCETS MISC Check blood glucose twice daily before meals. 09/23/18   Mack Hook, MD  Blood Glucose Monitoring Suppl (AGAMATRIX PRESTO) w/Device KIT Check blood glucose twice daily before meals. 09/23/18   Mack Hook, MD  glipiZIDE (GLUCOTROL) 5 MG tablet 1/2 tab by mouth twice daily with meals 01/11/19   Robyn Haber, MD  glucose blood (AGAMATRIX PRESTO TEST) test strip Check blood glucose twice daily before meals 09/23/18   Mack Hook, MD  lisinopril (PRINIVIL,ZESTRIL) 10 MG tablet Take 1 tablet (10 mg total) by mouth daily. 01/11/19   Robyn Haber, MD  metFORMIN (GLUCOPHAGE) 500 MG tablet 1 tab by mouth twice daily with meals 01/11/19   Robyn Haber, MD    Family History Family History  Problem Relation Age of Onset  . Stroke Mother   . Diabetes Mother        Toward end of life    Social History Social History   Tobacco Use  . Smoking status: Former Smoker    Types: Cigars  . Smokeless tobacco: Current User    Types: Chew  . Tobacco comment: 2 Black and mild daily.  Quit cigarettes in 2007  Substance Use Topics  . Alcohol use: Yes    Comment: pint weekly  . Drug use: Not Currently    Types: Marijuana     Allergies   Bee venom   Review of Systems Review of Systems  Constitutional: Negative for activity change, appetite change and fever.  HENT: Negative for congestion and rhinorrhea.   Respiratory: Negative for cough, chest tightness and shortness of breath.   Cardiovascular: Negative for chest pain.  Gastrointestinal: Negative for abdominal pain, nausea and vomiting.  Genitourinary: Negative for dysuria, hematuria and testicular pain.  Musculoskeletal: Positive for arthralgias and myalgias.  Skin: Positive for wound.  Neurological: Negative for dizziness, weakness and headaches.   all other systems are negative  except as noted in the HPI and PMH.     Physical Exam Updated Vital Signs BP (!) 201/111 (BP Location: Right Arm)   Pulse 78   Temp 98.2 F (36.8 C) (Oral)   Resp 16   SpO2 100%   Physical Exam Vitals signs and nursing note reviewed.  Constitutional:      General: He is not in acute distress.    Appearance: He is well-developed.  HENT:     Head: Normocephalic and atraumatic.     Mouth/Throat:     Pharynx: No oropharyngeal exudate.  Eyes:     Conjunctiva/sclera: Conjunctivae normal.     Pupils: Pupils are equal, round, and reactive to light.  Neck:      Musculoskeletal: Normal range of motion and neck supple.     Comments: No meningismus. Cardiovascular:     Rate and Rhythm: Normal rate and regular rhythm.     Heart sounds: Normal heart sounds. No murmur.  Pulmonary:     Effort: Pulmonary effort is normal. No respiratory distress.     Breath sounds: Normal breath sounds.  Abdominal:     Palpations: Abdomen is soft.     Tenderness: There is no abdominal tenderness. There is no guarding or rebound.  Musculoskeletal: Normal range of motion.        General: Swelling, tenderness and signs of injury present.     Comments: Left great toe as depicted with soft tissue loss over the plantar surface.  There is no purulence or drainage.  There is mild erythema.  There is no visible bone.  Intact DP and PT pulse with palpation.  Skin:    General: Skin is warm.     Capillary Refill: Capillary refill takes less than 2 seconds.  Neurological:     General: No focal deficit present.     Mental Status: He is alert and oriented to person, place, and time. Mental status is at baseline.     Cranial Nerves: No cranial nerve deficit.     Motor: No abnormal muscle tone.     Coordination: Coordination normal.     Comments: No ataxia on finger to nose bilaterally. No pronator drift. 5/5 strength throughout. CN 2-12 intact.Equal grip strength. Sensation intact.   Psychiatric:        Behavior: Behavior normal.          ED Treatments / Results  Labs (all labs ordered are listed, but only abnormal results are displayed) Labs Reviewed  COMPREHENSIVE METABOLIC PANEL - Abnormal; Notable for the following components:      Result Value   Glucose, Bld 265 (*)    AST 13 (*)    All other components within normal limits  CBC WITH DIFFERENTIAL/PLATELET - Abnormal; Notable for the following components:   Hemoglobin 12.7 (*)    All other components within normal limits  LACTIC ACID, PLASMA  LACTIC ACID, PLASMA    EKG None  Radiology Dg Foot Complete  Left  Result Date: 05/14/2019 CLINICAL DATA:  Wound on first toe following removing hangnail, initial encounter EXAM: LEFT FOOT - COMPLETE 3+ VIEW COMPARISON:  None. FINDINGS: Soft tissue wound is noted consistent with the given clinical history. The underlying bony structures appear within normal limits without bony erosive change. Degenerative changes of first MTP joint and tarsal bones are noted. No soft tissue abnormality is seen. IMPRESSION: Soft tissue wound without definitive osteomyelitis. No radiopaque foreign body is noted. Electronically Signed   By: Linus Mako.D.  On: 05/14/2019 21:56    Procedures Procedures (including critical care time)  Medications Ordered in ED Medications  sodium chloride flush (NS) 0.9 % injection 3 mL (has no administration in time range)     Initial Impression / Assessment and Plan / ED Course  I have reviewed the triage vital signs and the nursing notes.  Pertinent labs & imaging results that were available during my care of the patient were reviewed by me and considered in my medical decision making (see chart for details).       Diabetic with foot wound.  He has intact pulses.  X-rays negative for obvious osteomyelitis.  Patient hypertensive.  Refusing recheck of blood pressure.  He is anxious and pacing the room.  States he does not have a doctor.  Recommend admission for IV antibiotics and further evaluation to assess for osteomyelitis in his foot.  He declines this and insists he is going home.  He understands that he could be at risk for having gangrene and osteomyelitis and worsening infection which could lead to loss of his toe and foot.  He appears to have capacity to make medical decisions and will be leaving against medical advice.   Patient was agreeable to receiving prescription for p.o. antibiotics but understands that admission is recommended given the extent of his foot infection and risk for possible worsening including gangrene,  loss of toe, loss of foot, sepsis, and death.  He appears to have capacity to make medical decisions and will leave [facility].  He refuses to have his blood pressure rechecked.  He understands he is free to return at any time.  Final Clinical Impressions(s) / ED Diagnoses   Final diagnoses:  Ulcer of left foot, unspecified ulcer stage [facility])    ED Discharge Orders    None       Ezequiel Essex, MD 05/15/19 [phone]

## 2019-05-15 NOTE — Discharge Instructions (Signed)
You are leaving [facility].  It was recommended that you be admitted to the hospital for your uncontrolled blood pressure as well as your wound on your foot.  As we discussed the wound on your foot could progress to involve severe infection including gangrene and the bone.  Return to the ED if you wish to be reevaluated.

## 2019-06-03 ENCOUNTER — Encounter (HOSPITAL_COMMUNITY): Payer: Self-pay

## 2019-06-03 ENCOUNTER — Ambulatory Visit (HOSPITAL_COMMUNITY)
Admission: EM | Admit: 2019-06-03 | Discharge: 2019-06-03 | Disposition: A | Discharge status: Home / Self Care | Payer: Self-pay

## 2019-06-03 ENCOUNTER — Encounter (HOSPITAL_COMMUNITY): Payer: Self-pay | Admitting: Urgent Care

## 2019-06-03 ENCOUNTER — Other Ambulatory Visit: Payer: Self-pay

## 2019-06-03 ENCOUNTER — Emergency Department (HOSPITAL_COMMUNITY): Payer: Self-pay

## 2019-06-03 ENCOUNTER — Inpatient Hospital Stay (HOSPITAL_COMMUNITY)
Admission: EM | Admit: 2019-06-03 | Discharge: 2019-06-08 | DRG: 854 | Disposition: A | Discharge status: Home Health | Payer: Self-pay | Attending: Family Medicine | Admitting: Family Medicine

## 2019-06-03 DIAGNOSIS — E1169 Type 2 diabetes mellitus with other specified complication: Secondary | ICD-10-CM | POA: Diagnosis present

## 2019-06-03 DIAGNOSIS — T148XXA Other injury of unspecified body region, initial encounter: Secondary | ICD-10-CM

## 2019-06-03 DIAGNOSIS — Z79899 Other long term (current) drug therapy: Secondary | ICD-10-CM

## 2019-06-03 DIAGNOSIS — Z20828 Contact with and (suspected) exposure to other viral communicable diseases: Secondary | ICD-10-CM | POA: Diagnosis present

## 2019-06-03 DIAGNOSIS — E11628 Type 2 diabetes mellitus with other skin complications: Secondary | ICD-10-CM | POA: Diagnosis present

## 2019-06-03 DIAGNOSIS — Z823 Family history of stroke: Secondary | ICD-10-CM

## 2019-06-03 DIAGNOSIS — E119 Type 2 diabetes mellitus without complications: Secondary | ICD-10-CM

## 2019-06-03 DIAGNOSIS — R509 Fever, unspecified: Secondary | ICD-10-CM

## 2019-06-03 DIAGNOSIS — Z7984 Long term (current) use of oral hypoglycemic drugs: Secondary | ICD-10-CM

## 2019-06-03 DIAGNOSIS — Z833 Family history of diabetes mellitus: Secondary | ICD-10-CM

## 2019-06-03 DIAGNOSIS — L02612 Cutaneous abscess of left foot: Secondary | ICD-10-CM | POA: Diagnosis present

## 2019-06-03 DIAGNOSIS — M869 Osteomyelitis, unspecified: Secondary | ICD-10-CM | POA: Diagnosis present

## 2019-06-03 DIAGNOSIS — I10 Essential (primary) hypertension: Secondary | ICD-10-CM | POA: Diagnosis present

## 2019-06-03 DIAGNOSIS — I160 Hypertensive urgency: Secondary | ICD-10-CM

## 2019-06-03 DIAGNOSIS — M86172 Other acute osteomyelitis, left ankle and foot: Secondary | ICD-10-CM | POA: Diagnosis present

## 2019-06-03 DIAGNOSIS — Z91030 Bee allergy status: Secondary | ICD-10-CM

## 2019-06-03 DIAGNOSIS — L03115 Cellulitis of right lower limb: Secondary | ICD-10-CM

## 2019-06-03 DIAGNOSIS — A419 Sepsis, unspecified organism: Principal | ICD-10-CM | POA: Diagnosis present

## 2019-06-03 DIAGNOSIS — Z9114 Patient's other noncompliance with medication regimen: Secondary | ICD-10-CM

## 2019-06-03 DIAGNOSIS — F17290 Nicotine dependence, other tobacco product, uncomplicated: Secondary | ICD-10-CM | POA: Diagnosis present

## 2019-06-03 DIAGNOSIS — E1165 Type 2 diabetes mellitus with hyperglycemia: Secondary | ICD-10-CM

## 2019-06-03 DIAGNOSIS — E785 Hyperlipidemia, unspecified: Secondary | ICD-10-CM | POA: Diagnosis present

## 2019-06-03 DIAGNOSIS — L089 Local infection of the skin and subcutaneous tissue, unspecified: Secondary | ICD-10-CM

## 2019-06-03 DIAGNOSIS — L97529 Non-pressure chronic ulcer of other part of left foot with unspecified severity: Secondary | ICD-10-CM | POA: Diagnosis present

## 2019-06-03 DIAGNOSIS — E11621 Type 2 diabetes mellitus with foot ulcer: Secondary | ICD-10-CM | POA: Diagnosis present

## 2019-06-03 DIAGNOSIS — E1140 Type 2 diabetes mellitus with diabetic neuropathy, unspecified: Secondary | ICD-10-CM | POA: Diagnosis present

## 2019-06-03 LAB — CBC WITH DIFFERENTIAL/PLATELET
Abs Immature Granulocytes: 0.02 10*3/uL (ref 0.00–0.07)
Basophils Absolute: 0 10*3/uL (ref 0.0–0.1)
Basophils Relative: 0 %
Eosinophils Absolute: 0.1 10*3/uL (ref 0.0–0.5)
Eosinophils Relative: 2 %
HCT: 39 % (ref 39.0–52.0)
Hemoglobin: 12.7 g/dL — ABNORMAL LOW (ref 13.0–17.0)
Immature Granulocytes: 0 %
Lymphocytes Relative: 15 %
Lymphs Abs: 1.2 10*3/uL (ref 0.7–4.0)
MCH: 28.7 pg (ref 26.0–34.0)
MCHC: 32.6 g/dL (ref 30.0–36.0)
MCV: 88.2 fL (ref 80.0–100.0)
Monocytes Absolute: 0.9 10*3/uL (ref 0.1–1.0)
Monocytes Relative: 11 %
Neutro Abs: 5.8 10*3/uL (ref 1.7–7.7)
Neutrophils Relative %: 72 %
Platelets: 379 10*3/uL (ref 150–400)
RBC: 4.42 MIL/uL (ref 4.22–5.81)
RDW: 13.2 % (ref 11.5–15.5)
WBC: 8 10*3/uL (ref 4.0–10.5)
nRBC: 0 % (ref 0.0–0.2)

## 2019-06-03 LAB — URINALYSIS, ROUTINE W REFLEX MICROSCOPIC
Bacteria, UA: NONE SEEN
Bilirubin Urine: NEGATIVE
Glucose, UA: 50 mg/dL — AB
Ketones, ur: NEGATIVE mg/dL
Leukocytes,Ua: NEGATIVE
Nitrite: NEGATIVE
Protein, ur: >=300 mg/dL — AB
Specific Gravity, Urine: 1.019 (ref 1.005–1.030)
pH: 5 (ref 5.0–8.0)

## 2019-06-03 LAB — COMPREHENSIVE METABOLIC PANEL
ALT: 15 U/L (ref 0–44)
AST: 15 U/L (ref 15–41)
Albumin: 3.4 g/dL — ABNORMAL LOW (ref 3.5–5.0)
Alkaline Phosphatase: 65 U/L (ref 38–126)
Anion gap: 10 (ref 5–15)
BUN: 14 mg/dL (ref 6–20)
CO2: 28 mmol/L (ref 22–32)
Calcium: 9.6 mg/dL (ref 8.9–10.3)
Chloride: 98 mmol/L (ref 98–111)
Creatinine, Ser: 1.22 mg/dL (ref 0.61–1.24)
GFR calc Af Amer: >60 mL/min (ref >60)
GFR calc non Af Amer: >60 mL/min (ref >60)
Glucose, Bld: 196 mg/dL — ABNORMAL HIGH (ref 70–99)
Potassium: 3.8 mmol/L (ref 3.5–5.1)
Sodium: 136 mmol/L (ref 135–145)
Total Bilirubin: 0.4 mg/dL (ref 0.3–1.2)
Total Protein: 7.7 g/dL (ref 6.5–8.1)

## 2019-06-03 MED ORDER — SODIUM CHLORIDE 0.9% FLUSH: 3.0000 mL | Freq: Once | INTRAVENOUS | Status: DC

## 2019-06-03 MED ORDER — OXYCODONE-ACETAMINOPHEN 5-325 MG PO TABS
1.0000 | ORAL_TABLET | ORAL | Status: DC | PRN
  Administered 2019-06-03: 1 via ORAL
  Filled 2019-06-03: qty 1

## 2019-06-03 NOTE — ED Triage Notes (Signed)
Patient has a wound on left great toe that has progressively gotten worse over the past 3 weeks. Per patient, toe has black discoloration. Entire left foot is swollen as well. Rates pain a 9.  Patient is also diabetic and does not take his medication regularly. has difficulty getting his medications since he has no insurance.

## 2019-06-03 NOTE — ED Triage Notes (Signed)
PT has a wound on right great toe. PT was seen for same in UC and ED 05/14/2019. PT reports wound has gotten worse and worse. PT is diabetic and has not been taking his medicine.

## 2019-06-03 NOTE — ED Provider Notes (Signed)
MRN: [id number] DOB: 1956/07/31  Subjective:   Scott Greer is a 63 y.o. male presenting for 1 month history of worsening right foot infection.  Patient was previously seen earlier in July and referred to the emergency room where it was recommended that he be admitted but patient refused.  Since then his foot infection has worsened and now has severe swelling of his right foot with pain which the patient states is new.  He admits medical noncompliance with respect to his diabetes and blood pressure.  No current facility-administered medications for this encounter.   Current Outpatient Medications:  .  AGAMATRIX ULTRA-THIN LANCETS MISC, Check blood glucose twice daily before meals., Disp: 100 each, Rfl: 11 .  amoxicillin-clavulanate (AUGMENTIN) 875-125 MG tablet, Take 1 tablet by mouth every 12 (twelve) hours., Disp: 20 tablet, Rfl: 0 .  Blood Glucose Monitoring Suppl (AGAMATRIX PRESTO) w/Device KIT, Check blood glucose twice daily before meals., Disp: 1 kit, Rfl: 0 .  glipiZIDE (GLUCOTROL) 5 MG tablet, 1/2 tab by mouth twice daily with meals (Patient not taking: Reported on 05/15/2019), Disp: 30 tablet, Rfl: 11 .  glucose blood (AGAMATRIX PRESTO TEST) test strip, Check blood glucose twice daily before meals, Disp: 100 each, Rfl: 12 .  lisinopril (PRINIVIL,ZESTRIL) 10 MG tablet, Take 1 tablet (10 mg total) by mouth daily. (Patient not taking: Reported on 05/15/2019), Disp: 30 tablet, Rfl: 11 .  metFORMIN (GLUCOPHAGE) 500 MG tablet, 1 tab by mouth twice daily with meals (Patient not taking: Reported on 05/15/2019), Disp: 60 tablet, Rfl: 11   Allergies  Allergen Reactions  . Bee Venom     Past Medical History:  Diagnosis Date  . Diabetes mellitus without complication ([place]) 9024  . Glaucoma 2015  . Hyperlipidemia   . Hypertension 2005     History reviewed. No pertinent surgical history.  ROS  Objective:   Vitals: BP (!) 197/119 (BP Location: Right Arm)   Pulse 80   Temp 98.5 F  (36.9 C) (Temporal)   Resp 16   SpO2 97%   Physical Exam Constitutional:      General: He is not in acute distress.    Appearance: Normal appearance. He is well-developed and normal weight. He is ill-appearing (chronically). He is not toxic-appearing or diaphoretic.  HENT:     Head: Normocephalic and atraumatic.     Right Ear: External ear normal.     Left Ear: External ear normal.     Nose: Nose normal.     Mouth/Throat:     Mouth: Mucous membranes are moist.     Pharynx: Oropharynx is clear.  Eyes:     General: No scleral icterus.    Extraocular Movements: Extraocular movements intact.     Pupils: Pupils are equal, round, and reactive to light.  Cardiovascular:     Rate and Rhythm: Normal rate and regular rhythm.     Heart sounds: Normal heart sounds. No murmur. No friction rub. No gallop.   Pulmonary:     Effort: Pulmonary effort is normal. No respiratory distress.     Breath sounds: Normal breath sounds. No stridor. No wheezing, rhonchi or rales.  Musculoskeletal:     Right foot: Decreased range of motion and decreased capillary refill. Tenderness and swelling present.       Feet:     Comments: Erythema, warmth and 2+ edema of right foot extending up to distal lower right extremity.  Neurological:     General: No focal deficit present.     Mental  Status: He is alert and oriented to person, place, and time.     Cranial Nerves: No cranial nerve deficit.  Psychiatric:        Mood and Affect: Mood normal.        Behavior: Behavior normal.        Thought Content: Thought content normal.             Assessment and Plan :   1. Wound infection   2. Cellulitis of right foot   3. Uncontrolled type 2 diabetes mellitus with hyperglycemia ([place])   4. Hypertensive urgency     Patient has severely infected wound.  Counseled that he will at the very least need IV antibiotics but my hope is that he will consider the admission that was suggested last time.  I also spoke  with his wife to make sure that she understood severity of his infection and our plan to refer him to the emergency room.   Jaynee Eagles, [place] 06/03/19 2049

## 2019-06-03 NOTE — Discharge Instructions (Signed)
Please report to the ER immediately. I suspect you will be admitted to the hospital for your severe infection or at the very least get IV antibiotics. Please do not leave the ER before being seen.

## 2019-06-04 ENCOUNTER — Encounter (HOSPITAL_COMMUNITY): Payer: Self-pay

## 2019-06-04 ENCOUNTER — Encounter (HOSPITAL_COMMUNITY): Payer: Self-pay | Admitting: Internal Medicine

## 2019-06-04 DIAGNOSIS — E1169 Type 2 diabetes mellitus with other specified complication: Secondary | ICD-10-CM

## 2019-06-04 DIAGNOSIS — L089 Local infection of the skin and subcutaneous tissue, unspecified: Secondary | ICD-10-CM

## 2019-06-04 DIAGNOSIS — I10 Essential (primary) hypertension: Secondary | ICD-10-CM

## 2019-06-04 DIAGNOSIS — M869 Osteomyelitis, unspecified: Secondary | ICD-10-CM | POA: Diagnosis present

## 2019-06-04 DIAGNOSIS — Z9119 Patient's noncompliance with other medical treatment and regimen: Secondary | ICD-10-CM | POA: Insufficient documentation

## 2019-06-04 DIAGNOSIS — Z91199 Patient's noncompliance with other medical treatment and regimen due to unspecified reason: Secondary | ICD-10-CM | POA: Insufficient documentation

## 2019-06-04 DIAGNOSIS — E11628 Type 2 diabetes mellitus with other skin complications: Secondary | ICD-10-CM

## 2019-06-04 DIAGNOSIS — Z9114 Patient's other noncompliance with medication regimen: Secondary | ICD-10-CM

## 2019-06-04 LAB — LACTIC ACID, PLASMA: Lactic Acid, Venous: 0.9 mmol/L (ref 0.5–1.9)

## 2019-06-04 LAB — PREALBUMIN: Prealbumin: 13.8 mg/dL — ABNORMAL LOW (ref 18–38)

## 2019-06-04 LAB — SEDIMENTATION RATE: Sed Rate: 78 mm/hr — ABNORMAL HIGH (ref 0–16)

## 2019-06-04 LAB — HEMOGLOBIN A1C
Hgb A1c MFr Bld: 10.3 % — ABNORMAL HIGH (ref 4.8–5.6)
Mean Plasma Glucose: 248.91 mg/dL

## 2019-06-04 LAB — GLUCOSE, CAPILLARY
Glucose-Capillary: 227 mg/dL — ABNORMAL HIGH (ref 70–99)
Glucose-Capillary: 236 mg/dL — ABNORMAL HIGH (ref 70–99)
Glucose-Capillary: 147 mg/dL — ABNORMAL HIGH (ref 70–99)

## 2019-06-04 LAB — C-REACTIVE PROTEIN: CRP: 4.5 mg/dL — ABNORMAL HIGH (ref <1.0)

## 2019-06-04 LAB — SARS CORONAVIRUS 2 BY RT PCR (HOSPITAL ORDER, PERFORMED IN ~~LOC~~ HOSPITAL LAB): SARS Coronavirus 2: NEGATIVE

## 2019-06-04 LAB — HIV ANTIBODY (ROUTINE TESTING W REFLEX): HIV Screen 4th Generation wRfx: NONREACTIVE

## 2019-06-04 MED ORDER — INSULIN ASPART 100 UNIT/ML ~~LOC~~ SOLN
0.0000 [IU] | Freq: Three times a day (TID) | SUBCUTANEOUS | Status: DC
  Administered 2019-06-04 (×2): 3 [IU] via SUBCUTANEOUS
  Administered 2019-06-05: 1 [IU] via SUBCUTANEOUS
  Administered 2019-06-06 (×2): 2 [IU] via SUBCUTANEOUS
  Administered 2019-06-06 – 2019-06-07 (×3): 3 [IU] via SUBCUTANEOUS
  Administered 2019-06-07 – 2019-06-08 (×3): 2 [IU] via SUBCUTANEOUS

## 2019-06-04 MED ORDER — PRO-STAT SUGAR FREE PO LIQD
30.0000 mL | Freq: Two times a day (BID) | ORAL | Status: DC
  Administered 2019-06-05 – 2019-06-08 (×6): 30 mL via ORAL
  Filled 2019-06-04 (×6): qty 30

## 2019-06-04 MED ORDER — SODIUM CHLORIDE 0.9 % IV SOLN: 2.0000 g | INTRAVENOUS | Status: DC

## 2019-06-04 MED ORDER — INSULIN ASPART 100 UNIT/ML ~~LOC~~ SOLN
0.0000 [IU] | Freq: Every day | SUBCUTANEOUS | Status: DC
  Administered 2019-06-05: 3 [IU] via SUBCUTANEOUS
  Administered 2019-06-07: 2 [IU] via SUBCUTANEOUS

## 2019-06-04 MED ORDER — LISINOPRIL 10 MG PO TABS
10.0000 mg | ORAL_TABLET | Freq: Every day | ORAL | Status: DC
  Administered 2019-06-04 – 2019-06-07 (×4): 10 mg via ORAL
  Filled 2019-06-04 (×4): qty 1

## 2019-06-04 MED ORDER — SODIUM CHLORIDE 0.9 % IV SOLN
2.0000 g | INTRAVENOUS | Status: DC
  Filled 2019-06-04: qty 20

## 2019-06-04 MED ORDER — JUVEN PO PACK
1.0000 | PACK | Freq: Two times a day (BID) | ORAL | Status: DC
  Administered 2019-06-06 – 2019-06-08 (×5): 1 via ORAL
  Filled 2019-06-04 (×9): qty 1

## 2019-06-04 MED ORDER — ACETAMINOPHEN 325 MG PO TABS
650.0000 mg | ORAL_TABLET | Freq: Four times a day (QID) | ORAL | Status: DC | PRN
  Administered 2019-06-04 – 2019-06-07 (×6): 650 mg via ORAL
  Filled 2019-06-04 (×5): qty 2

## 2019-06-04 MED ORDER — METRONIDAZOLE 500 MG PO TABS
500.0000 mg | ORAL_TABLET | Freq: Three times a day (TID) | ORAL | Status: DC
  Administered 2019-06-04 – 2019-06-08 (×12): 500 mg via ORAL
  Filled 2019-06-04 (×11): qty 1

## 2019-06-04 MED ORDER — OXYCODONE HCL 5 MG PO TABS
5.0000 mg | ORAL_TABLET | ORAL | Status: DC | PRN
  Administered 2019-06-04 – 2019-06-05 (×3): 5 mg via ORAL
  Filled 2019-06-04 (×2): qty 1

## 2019-06-04 MED ORDER — METRONIDAZOLE 500 MG PO TABS
500.0000 mg | ORAL_TABLET | Freq: Three times a day (TID) | ORAL | Status: DC
  Filled 2019-06-04: qty 1

## 2019-06-04 MED ORDER — HEPARIN SODIUM (PORCINE) 5000 UNIT/ML IJ SOLN
5000.0000 [IU] | Freq: Three times a day (TID) | INTRAMUSCULAR | Status: DC
  Administered 2019-06-04 – 2019-06-08 (×9): 5000 [IU] via SUBCUTANEOUS
  Filled 2019-06-04 (×8): qty 1

## 2019-06-04 MED ORDER — HEPARIN SODIUM (PORCINE) 5000 UNIT/ML IJ SOLN
5000.0000 [IU] | Freq: Three times a day (TID) | INTRAMUSCULAR | Status: DC
  Filled 2019-06-04: qty 1

## 2019-06-04 MED ORDER — SODIUM CHLORIDE 0.9 % IV SOLN
2.0000 g | INTRAVENOUS | Status: DC
  Administered 2019-06-04 – 2019-06-08 (×4): 2 g via INTRAVENOUS
  Filled 2019-06-04 (×5): qty 20

## 2019-06-04 MED ORDER — METRONIDAZOLE 500 MG PO TABS: 500.0000 mg | ORAL_TABLET | Freq: Three times a day (TID) | ORAL | Status: DC

## 2019-06-04 MED ORDER — ACETAMINOPHEN 650 MG RE SUPP: 650.0000 mg | Freq: Four times a day (QID) | RECTAL | Status: DC | PRN

## 2019-06-04 MED ORDER — SODIUM CHLORIDE 0.9 % IV SOLN
INTRAVENOUS | Status: DC
  Administered 2019-06-04 – 2019-06-06 (×5): via INTRAVENOUS

## 2019-06-04 NOTE — Progress Notes (Signed)
Initial Nutrition Assessment  DOCUMENTATION CODES:   Not applicable  INTERVENTION:   - 1 packet Juven BID, each packet provides 95 calories, 2.5 grams of protein, and 9.8 grams of carbohydrate; also contains L-arginine and L-glutamine,vitamin C, vitamin E, vitamin B-12, zinc, calcium, and calcium Beta-hydroxy-Beta-methylbutyrate to support wound healing  - Pro-stat 30 ml BID, each supplement provides 100 kcal and 15 grams of protein  NUTRITION DIAGNOSIS:   Increased nutrient needs related to wound healing as evidenced by estimated needs.  GOAL:   Patient will meet greater than or equal to 90% of their needs  MONITOR:   PO intake, Supplement acceptance, Labs, I & O's, Weight trends, Skin  REASON FOR ASSESSMENT:   Consult Wound healing  ASSESSMENT:   63 year old male who presented to the ED on 7/29 with a progressively worsening left great toe wound. PMH of DM, HLD, HTN. X-ray with findings of osteomyelitis.   Orthopedics recommending amputation but pt is unsure about that at this time.  Per chart review, pt has been without his DM medications for 3 months due to inability to pick them up from the clinic.  Spoke with pt at bedside. Pt reports that he has a good appetite currently. Pt states that he has noticed his appetite decreasing as he gets older. Pt reports that at one point, he was eating 3 meals daily and that now he just eats lunch and dinner. A meal may include a "snack" or takeout. Pt states that he does not cook much for himself anymore.  Pt shares that he drinks water and Gatorade. Pt drinks Gatorade when he is working outdoors. RD encouraged pt to drink G2 or Gatorade Zero to minimize added sugar intake from beverages.  Pt confirms that he has been able to get his medications for about 3 months PTA due to cost as well as difficulty with the clinic that prescribes them. Pt states he had to choose between antibiotics and his "sugar pills" so he chose to go without  his DM medications.  Pt shares that he has lost some weight and reports his UBW as 238-240 lbs. Pt reports that now he weighs "in the 220's." Weight on admission of 225 lbs appears to be stated rather than measured. Pt is unable to put a timeframe on his weight loss but believes it was related to him not eating as much as he used to. Pt reports that his MD would like him to lose even more weight.  RD discussed the link between poorly controlled blood sugars and difficulties with wound healing. Also discussed the importance of adequate kcal and protein intake in wound healing. Pt states that he may have to have an amputation and is worried that he will just run into the same issue with a different toe once this one is amputated. RD discussed the interplay of taking his DM medications, following a diabetic friendly diet, and consuming adequate nutrition for overall wound healing.  Pt is willing to try Pro-stat and Juven while admitted to aid in wound healing. RD to order. RD will monitor for PO intake and can provide an oral nutrition supplement if PO intake is poor. No meal completions recorded in pt's chart at this time.  Medications reviewed and include: SSI, IV abx IVF: NS @ 125 ml/hr  Labs reviewed: hemoglobin A1C 10.3 CBG's: 227   NUTRITION - FOCUSED PHYSICAL EXAM:    Most Recent Value  Orbital Region  No depletion  Upper Arm Region  No depletion  Thoracic and Lumbar Region  No depletion  Buccal Region  No depletion  Temple Region  No depletion  Clavicle Bone Region  No depletion  Clavicle and Acromion Bone Region  No depletion  Scapular Bone Region  No depletion  Dorsal Hand  No depletion  Patellar Region  No depletion  Anterior Thigh Region  No depletion  Edema (RD Assessment)  None  Hair  Reviewed  Eyes  Reviewed  Mouth  Reviewed  Skin  Reviewed  Nails  Reviewed       Diet Order:   Diet Order            Diet Carb Modified Fluid consistency: Thin; Room service  appropriate? Yes  Diet effective now              EDUCATION NEEDS:   Education needs have been addressed  Skin:  Skin Assessment: Skin Integrity Issues: Diabetic Ulcer: left great toe  Last BM:  no documented BM  Height:   Ht Readings from Last 1 Encounters:  06/03/19 6\' 3"  (1.905 m)    Weight:   Wt Readings from Last 1 Encounters:  06/03/19 102.1 kg    Ideal Body Weight:  89.1 kg  BMI:  Body mass index is 28.12 kg/m.  Estimated Nutritional Needs:   Kcal:  2300-2500  Protein:  110-125 grams  Fluid:  >/= 2.0 L    Earma Reading, MS, RD, LDN Inpatient Clinical Dietitian Pager: [phone] Weekend/After Hours: [phone]

## 2019-06-04 NOTE — Consult Note (Signed)
WOC Nurse has reviewed record and this patient has a positive xray or MRI for osteomyelitis, this is considered outside of the scope of practice for the WOC nurse, for that reason WOC Nurse will not consult.  ? ?Re-consult if only topical wound care needed after orthopedic or surgical evaluation ?Markia Kyer MSN, RN, CWOCN, CNS, CWON-AP ?[phone]   ?

## 2019-06-04 NOTE — Social Work (Signed)
CSW acknowledging consult for access to medications at discharge.  Will f/u with pt when stable for discharge.  Westley Hummer, MSW, [place] Work [phone]

## 2019-06-04 NOTE — H&P (View-Only) (Signed)
ORTHOPAEDIC CONSULTATION  REQUESTING PHYSICIAN: Little Ishikawa, MD  Chief Complaint: Ulceration purulent drainage pain and osteomyelitis left great toe.  HPI: Scott Greer is a 63 y.o. male who presents with osteomyelitis with purulent drainage ulceration and pain of the left great toe.  Patient is diabetic he states he has not used his diabetic medicines in a while since he could not afford them.  He states he did pick up a prescription [facility] and has not had any improvement after taking oral antibiotics.  Patient states [facility] has [facility] stating [facility] wanted a [facility].  Ulcer is been present for over a month.  Patient is also been treating the ulcer with alcohol irrigation.  Past Medical History:  Diagnosis Date  . Diabetes mellitus without complication ([place]) 6160  . Glaucoma 2015  . Hyperlipidemia   . Hypertension 2005   Past Surgical History:  Procedure Laterality Date  . NO PAST SURGERIES     Social History   Socioeconomic History  . Marital status: Legally Separated    Spouse name: Nira Conn  . Number of children: 2  . Years of education: Not on file  . Highest education level: Associate degree: occupational, Hotel manager, or vocational program  Occupational History  . Not on file  Social Needs  . Financial resource strain: Not on file  . Food insecurity    Worry: Not on file    Inability: Not on file  . Transportation needs    Medical: Not on file    Non-medical: Not on file  Tobacco Use  . Smoking status: Current Every Day Smoker    Types: Cigars  . Smokeless tobacco: Current User    Types: Chew  . Tobacco comment: 4 Black and mild daily.  Quit cigarettes in 2007  Substance and Sexual Activity  . Alcohol use: Yes    Comment: pint weekly  . Drug use: Not Currently    Types: Marijuana  . Sexual activity: Not on file  Lifestyle  . Physical activity    Days per week: Not on file    Minutes per  session: Not on file  . Stress: Not on file  Relationships  . Social Herbalist on phone: Not on file    Gets together: Not on file    Attends religious service: Not on file    Active member of club or organization: Not on file    Attends meetings of clubs or organizations: Not on file    Relationship status: Not on file  Other Topics Concern  . Not on file  Social History Narrative   Out of prison for 2 months.   Lives at home with his wife.   Family History  Problem Relation Age of Onset  . Stroke Mother   . Diabetes Mother        Toward end of life   - negative except otherwise stated in the family history section Allergies  Allergen Reactions  . Bee Venom Hives, Itching and Swelling   Prior to Admission medications   Medication Sig Start Date End Date Taking? Authorizing Provider  omega-3 acid ethyl esters (LOVAZA) 1 g capsule Take 2 g by mouth daily.   Yes [provider]   Dg Foot Complete Left  Result Date: 06/03/2019 CLINICAL DATA:  Wound on great toe, left foot swelling, diabetic EXAM: LEFT FOOT - COMPLETE 3+ VIEW COMPARISON:  05/14/2019 FINDINGS: Fragmentation/destruction  of the 1st distal phalanx, new from the recent prior, with overlying soft tissue wound/ulceration. This appearance is compatible with acute osteomyelitis. Mild degenerative changes of the 1st MTP joint. Mild soft tissue swelling. IMPRESSION: Destructive changes involving the 1st distal phalanx, new from the recent prior, reflecting acute osteomyelitis. Electronically Signed   By: Charline Bills M.D.   On: 06/03/2019 22:00   - pertinent xrays, CT, MRI studies were reviewed and independently interpreted  Positive ROS: All other systems have been reviewed and were otherwise negative with the exception of those mentioned in the HPI and as above.  Physical Exam: General: Alert, no acute distress Psychiatric: Patient is competent for consent with normal mood and affect Lymphatic:  No axillary or cervical lymphadenopathy Cardiovascular: No pedal edema Respiratory: No cyanosis, no use of accessory musculature GI: No organomegaly, abdomen is soft and non-tender    Images:  @ENCIMAGES @  Labs:  Lab Results  Component Value Date   HGBA1C 10.3 (H) 06/04/2019   ESRSEDRATE 78 (H) 06/04/2019   CRP 4.5 (H) 06/04/2019    Lab Results  Component Value Date   ALBUMIN 3.4 (L) 06/03/2019   ALBUMIN 3.6 05/14/2019   PREALBUMIN 13.8 (L) 06/04/2019    Neurologic: Patient does not have protective sensation bilateral lower extremities.   MUSCULOSKELETAL:   Skin: Examination patient has necrotic ulcer of the left great toe there is no ascending cellulitis he does have swelling in the left lower extremity.  Patient has a palpable dorsalis pedis and posterior tibial pulse tenderness to palpation over the tuft of the great toe.  Review of the radiographs shows destruction of the tuft of the great toe consistent with chronic osteomyelitis.  Patient has a hemoglobin A1c of 10.3 with a pre-albumin of 13.8 consistent with uncontrolled diabetes and protein caloric malnutrition.  Assessment: Assessment: Diabetic insensate neuropathy with osteomyelitis ulceration and abscess of the left great toe.  Plan: Plan: We will plan for an amputation of the left great toe through the MTP joint tomorrow Friday.  Risks and benefits were discussed including risk of the wound not healing.  Patient states he understands wished to proceed at this time patient states he will have to return to work as soon as possible to be able to pay his bills.  Discussed that we would need to keep his weight off the foot for at least a week or 2.  Told patient to be n.p.o. after midnight.  Thank you for the consult and the opportunity to see Mr. Scott Altier, MD [facility] [phone] 4:27 PM

## 2019-06-04 NOTE — TOC Initial Note (Addendum)
Transition of Care [facility]) - Initial/Assessment Note    Patient Details  Name: Scott Greer MRN: [id number] Date of Birth: 22-Aug-1956  Transition of Care [facility]) CM/SW Contact:    Marilu Favre, RN Phone Number: 06/04/2019, 1:11 PM  Clinical Narrative:                  Patient from home with girl friend, confirmed face sheet information with patient.   Patient had told diabetics coordinator that he could not get his medications from [facility] due to Covid. Called [facility] . Patient has had multiple no show appointments and was asked at his last appointment to bring co pay , he did not. See note May 2020.  Patient states he would like to go to [place] at discharge for follow up. Will call to schedule appointment and assist patient with medications at discharge.   Follow up appointment at [facility] is on June 26, 2019 at 1:30 pm .  Await plan of care.    Barriers to Discharge: Continued Medical Work up   Patient Goals and CMS Choice Patient states their goals for this hospitalization and ongoing recovery are:: to go home CMS Medicare.gov Compare Post Acute Care list provided to:: Patient    Expected Discharge Plan and Services   In-house Referral: Financial Counselor Discharge Planning Services: CM Consult, [facility], [place], Medication Assistance   Living arrangements for the past 2 months: [place]                                      Prior Living Arrangements/Services Living arrangements for the past 2 months: Single Family Home Lives with:: Significant Other Patient language and need for interpreter reviewed:: Yes Do you feel safe going back to the place where you live?: Yes      Need for Family Participation in Patient Care: Yes (Comment) Care giver support system in place?: Yes (comment)   Criminal Activity/Legal Involvement Pertinent to Current  Situation/Hospitalization: No - Comment as needed  Activities of Daily Living      Permission Sought/Granted   Permission granted to share information with : Yes, Verbal Permission Granted     Permission granted to share info w AGENCY: [facility]        Emotional Assessment Appearance:: Appears stated age Attitude/Demeanor/Rapport: Engaged Affect (typically observed): Accepting Orientation: : Oriented to Self, Oriented to Place, Oriented to  Time, Oriented to Situation Alcohol / Substance Use: Not Applicable Psych Involvement: No (comment)  Admission diagnosis:  Noncompliance with medication regimen [Z91.14] Diabetic foot infection ([place]) [N36.144, L08.9] Essential hypertension [I10] Type 2 diabetes mellitus with other specified complication, without long-term current use of insulin ([place]) [E11.69] Osteomyelitis of left foot, unspecified type [facility]) [M86.9] Patient Active Problem List   Diagnosis Date Noted  . Osteomyelitis ([place]) 06/04/2019  . Diabetic foot infection ([place])   . Noncompliance with medication regimen   . Glaucoma   . Hyperlipidemia   . Hypertension 11/06/2003  . Diabetes mellitus ([place]) 11/05/1997   PCP:  Patient, No Pcp Per Pharmacy:   [id number] [place] [place] - [address] [place] [address] Phone: [phone] Fax: [phone]     Social Determinants of Health (SDOH) Interventions    Readmission Risk Interventions No flowsheet data found.

## 2019-06-04 NOTE — Progress Notes (Signed)
Inpatient Diabetes Program Recommendations  AACE/ADA: New Consensus Statement on Inpatient Glycemic Control (2015)  Target Ranges:  Prepandial:   less than 140 mg/dL      Peak postprandial:   less than 180 mg/dL (1-2 hours)      Critically ill patients:  140 - 180 mg/dL   Lab Results  Component Value Date   HGBA1C 10.3 (H) 06/04/2019    Review of Glycemic Control  Diabetes history: DM2 Outpatient Diabetes medications: Metformin 500 mg bid + Glipizide 5 mg bid Current orders for Inpatient glycemic control: Novolog sensitive tid + hs 0-5 units  Inpatient Diabetes Program Recommendations:   Noted patient has been off of diabetes medications x 3 months (note states unable to pick up from [facility] due to [place] . Will speak with patient after admission regarding diabetes management. -Start Levemir 10 units bid -Novolog 3 units tid meal coverage if eats 50%   Thank you, Bethena Roys E. Bret Vanessen, RN, MSN, CDE  Diabetes Coordinator Inpatient Glycemic Control Team Team Pager [phone] (8am-5pm) 06/04/2019 10:26 AM

## 2019-06-04 NOTE — Progress Notes (Signed)
  PROGRESS NOTE    Scott Greer  CWC:[id number] DOB: 11-Mar-1956 DOA: 06/03/2019 PCP: Patient, No Pcp Per   Brief Narrative:  Please see H&P from earlier today for complete HPI and history.  Patient admitted for likely osteo-myelitis -previously seen here earlier in the month recommended for inpatient stay on IV antibiotics but deferred to outpatient treatment with p.o. antibiotics with worsening left great toe infection.  Orthopedics has been consulted for following for possible debridement versus amputation, continue IV antibiotics per H&P.   Assessment & Plan:   Active Problems:   Diabetes mellitus ([place])   Hypertension   Osteomyelitis ([place])    LOS: 0 days    Time spent: 22min    Little Ishikawa, DO [facility]  If 7PM-7AM, please contact night-coverage [email or web address] Password [email or web address] 06/04/2019, 3:42 PM

## 2019-06-04 NOTE — ED Provider Notes (Signed)
[facility] EMERGENCY DEPARTMENT Provider Note   CSN: [id number] Arrival date & time: 06/03/19  2057    History   Chief Complaint Chief Complaint  Patient presents with   Wound Infection    left great toe wound    HPI Scott Greer is a 63 y.o. male with a hx of NIDDM presents to the Emergency Department complaining of gradual, persistent, progressively worsening left great toe wound onset approx 1 mo ago.  Pt reports he had a blister that he cut open at that time. Since then, things have worsened.  He reports cleaning the wound with alcohol and washing/bandaging it regularly.  Pt reports 2 days ago the pain worsened and his foot/ankle/lower leg began to swell.  Pt reports this prompted his visit to [place] and he was subsequently referred here.  Pt reports he has not been taking his medications due to financial hardship and difficulty getting into the [facility] where he receives his care.  He reports blood sugars in the 150-190 range, but has not checked his sugar in more than a month.  Pt denies fever, chills, headache, neck pain, chest pain, abd pain, N/V/D, weakness, dizziness, syncope.  Pt reports associated color change of the left great toe.      The history is provided by the patient and medical records. No language interpreter was used.    Past Medical History:  Diagnosis Date   Diabetes mellitus without complication ([place]) 8891   Glaucoma 2015   Hyperlipidemia    Hypertension 2005    Patient Active Problem List   Diagnosis Date Noted   Glaucoma    Hyperlipidemia    Hypertension 11/06/2003   Diabetes mellitus without complication ([place]) 69/45/0388    History reviewed. No pertinent surgical history.      Home Medications    Prior to Admission medications   Medication Sig Start Date End Date Taking? Authorizing Provider  AGAMATRIX ULTRA-THIN LANCETS MISC Check blood glucose twice daily before meals. 09/23/18   Mack Hook, MD  amoxicillin-clavulanate (AUGMENTIN) 875-125 MG tablet Take 1 tablet by mouth every 12 (twelve) hours. 05/15/19   Rancour, Annie Main, MD  Blood Glucose Monitoring Suppl (AGAMATRIX PRESTO) w/Device KIT Check blood glucose twice daily before meals. 09/23/18   Mack Hook, MD  glipiZIDE (GLUCOTROL) 5 MG tablet 1/2 tab by mouth twice daily with meals Patient not taking: Reported on 05/15/2019 01/11/19   Robyn Haber, MD  glucose blood (AGAMATRIX PRESTO TEST) test strip Check blood glucose twice daily before meals 09/23/18   Mack Hook, MD  lisinopril (PRINIVIL,ZESTRIL) 10 MG tablet Take 1 tablet (10 mg total) by mouth daily. Patient not taking: Reported on 05/15/2019 01/11/19   Robyn Haber, MD  metFORMIN (GLUCOPHAGE) 500 MG tablet 1 tab by mouth twice daily with meals Patient not taking: Reported on 05/15/2019 01/11/19   Robyn Haber, MD    Family History Family History  Problem Relation Age of Onset   Stroke Mother    Diabetes Mother        Toward end of life    Social History Social History   Tobacco Use   Smoking status: Current Every Day Smoker    Types: Cigars   Smokeless tobacco: Current User    Types: Chew   Tobacco comment: 2 Black and mild daily.  Quit cigarettes in 2007  Substance Use Topics   Alcohol use: Yes    Comment: pint weekly   Drug use: Not Currently  Types: Marijuana     Allergies   Bee venom   Review of Systems Review of Systems  Constitutional: Negative for appetite change, diaphoresis, fatigue, fever and unexpected weight change.  HENT: Negative for mouth sores.   Eyes: Negative for visual disturbance.  Respiratory: Negative for cough, chest tightness, shortness of breath and wheezing.   Cardiovascular: Positive for leg swelling ( left). Negative for chest pain.  Gastrointestinal: Negative for abdominal pain, constipation, diarrhea, nausea and vomiting.  Endocrine: Positive for polydipsia and polyuria.  Negative for polyphagia.  Genitourinary: Negative for dysuria, frequency, hematuria and urgency.  Musculoskeletal: Negative for back pain and neck stiffness.  Skin: Positive for color change and wound. Negative for rash.  Allergic/Immunologic: Negative for immunocompromised state.  Neurological: Negative for syncope, light-headedness and headaches.  Hematological: Does not bruise/bleed easily.  Psychiatric/Behavioral: Negative for sleep disturbance. The patient is not nervous/anxious.      Physical Exam Updated Vital Signs BP (!) 160/105 (BP Location: Right Arm)    Pulse 86    Temp 98.7 F (37.1 C) (Oral)    Resp 18    Ht 6' 3"  (1.905 m)    Wt 102.1 kg    SpO2 99%    BMI 28.12 kg/m   Physical Exam Vitals signs and nursing note reviewed.  Constitutional:      General: He is not in acute distress.    Appearance: He is not diaphoretic.  HENT:     Head: Normocephalic.  Eyes:     General: No scleral icterus.    Conjunctiva/sclera: Conjunctivae normal.  Neck:     Musculoskeletal: Normal range of motion.  Cardiovascular:     Rate and Rhythm: Normal rate and regular rhythm.     Pulses: Normal pulses.          Radial pulses are 2+ on the right side and 2+ on the left side.       Dorsalis pedis pulses are 2+ on the right side and 2+ on the left side.  Pulmonary:     Effort: No tachypnea, accessory muscle usage, prolonged expiration, respiratory distress or retractions.     Breath sounds: No stridor.     Comments: Equal chest rise. No increased work of breathing. Abdominal:     General: There is no distension.     Palpations: Abdomen is soft.     Tenderness: There is no abdominal tenderness. There is no guarding or rebound.  Musculoskeletal:     Right lower leg: No edema.     Left lower leg: 2+ Edema present.     Comments: Moves all extremities equally and without difficulty. Left great toe with large, ulcerated wound and color changes.  Left foot, ankle and lower leg are swollen,  erythematous and warm.  TTP throughout the lower portion of the LLE.    Skin:    General: Skin is warm and dry.     Capillary Refill: Capillary refill takes less than 2 seconds.  Neurological:     Mental Status: He is alert.     GCS: GCS eye subscore is 4. GCS verbal subscore is 5. GCS motor subscore is 6.     Comments: Speech is clear and goal oriented.  Psychiatric:        Mood and Affect: Mood normal.           ED Treatments / Results  Labs (all labs ordered are listed, but only abnormal results are displayed) Labs Reviewed  COMPREHENSIVE METABOLIC PANEL - Abnormal;  Notable for the following components:      Result Value   Glucose, Bld 196 (*)    Albumin 3.4 (*)    All other components within normal limits  CBC WITH DIFFERENTIAL/PLATELET - Abnormal; Notable for the following components:   Hemoglobin 12.7 (*)    All other components within normal limits  URINALYSIS, ROUTINE W REFLEX MICROSCOPIC - Abnormal; Notable for the following components:   Glucose, UA 50 (*)    Hgb urine dipstick SMALL (*)    Protein, ur >=300 (*)    All other components within normal limits  CULTURE, BLOOD (ROUTINE X 2)  CULTURE, BLOOD (ROUTINE X 2)  SARS CORONAVIRUS 2 (HOSPITAL ORDER, [facility])  LACTIC ACID, PLASMA  HIV ANTIBODY (ROUTINE TESTING W REFLEX)  CBG MONITORING, ED    Radiology Dg Foot Complete Left  Result Date: 06/03/2019 CLINICAL DATA:  Wound on great toe, left foot swelling, diabetic EXAM: LEFT FOOT - COMPLETE 3+ VIEW COMPARISON:  05/14/2019 FINDINGS: Fragmentation/destruction of the 1st distal phalanx, new from the recent prior, with overlying soft tissue wound/ulceration. This appearance is compatible with acute osteomyelitis. Mild degenerative changes of the 1st MTP joint. Mild soft tissue swelling. IMPRESSION: Destructive changes involving the 1st distal phalanx, new from the recent prior, reflecting acute osteomyelitis. Electronically Signed   By:  Julian Hy M.D.   On: 06/03/2019 22:00    Procedures Procedures (including critical care time)  Medications Ordered in ED Medications  sodium chloride flush (NS) 0.9 % injection 3 mL (has no administration in time range)  oxyCODONE-acetaminophen (PERCOCET/ROXICET) 5-325 MG per tablet 1 tablet (1 tablet Oral Given 06/03/19 2213)  0.9 %  sodium chloride infusion (has no administration in time range)     Initial Impression / Assessment and Plan / ED Course  I have reviewed the triage vital signs and the nursing notes.  Pertinent labs & imaging results that were available during my care of the patient were reviewed by me and considered in my medical decision making (see chart for details).  Clinical Course as of Jun 04 519  Thu Jun 04, 2019  0456 afebrile  Temp: 98.7 F (37.1 C) [HM]  0457 No tachycardia  Pulse Rate: 86 [HM]  0457 WNL  WBC: 8.0 [HM]  5638 Hx of same; noncompliant with meds due to financial strain.   BP(!): 160/105 [HM]  7564 Discussed with Dr. Peyton Najjar who will admit   [HM]    Clinical Course User Index [HM] Rondel Episcopo, Jarrett Soho, PA-C       Pt presents with a diabetic food wound that appears acutely infected.  Pt has been noncompliant with his medications.  No evidence of DKA today.  X-ray with findings of osteomyelitis.  No evidence of sepsis at this time. Will hold abx.  Blood cultures pending. Pt will need hospitalization for IV antibiotics and ortho consult.     Final Clinical Impressions(s) / ED Diagnoses   Final diagnoses:  Diabetic foot infection ([place])  Osteomyelitis of left foot, unspecified type ([place])  Essential hypertension  Type 2 diabetes mellitus with other specified complication, without long-term current use of insulin ([place])  Noncompliance with medication regimen    ED Discharge Orders    None       Agapito Games 06/04/19 0523    Mesner, Corene Cornea, MD 06/05/19 [phone]

## 2019-06-04 NOTE — ED Notes (Signed)
Diet tray ordered for pt 

## 2019-06-04 NOTE — H&P (Signed)
History and Physical    Scott Greer RUE:[id number] DOB: 1956/01/11 DOA: 06/03/2019  PCP: Scott Greer  Patient coming from: Home  Chief Complaint: toe pain  HPI: Scott Greer is a 63 y.o. male with medical history significant of DM2, HTN who presents with a foot wound and leg swelling.  Scott Greer reports that about 1 month ago, he developed a blister on the lateral aspect of his left great toe due to rubbing from his work boots.  He made a slice into the blister and then peeled the skin off.  Since that time it has slowly become more infected.  He presented to the ED on 7/9 and was advised to be admitted for IV antibiotics, but he preferred to go home with PO antibiotics.  He did pick up that prescription and completed the antibiotics; however, the wound has gotten worse.  Over the last week he has had increased pain in the left leg and foot, increased swelling of the left ankle and lower leg and warmth of that leg.  He has had no fever, chills, chest pain, cough, sputum production.  He has not had any medications for DM or HTN in the last 3 months.  He was going to the [employer] [employer], but due to [place], he was not able to go in for appointments.  He also does not have insurance or ways to regularly afford his medications, as he does not get a regular paycheck.  He has not had surgery before and would prefer not to have any surgery for this issue.    ED Course: In the ED, he had an xray which showed likely osteomyelitis.  He was not started on antibiotics initially.  UA showed proteinuria and small glucose.  COVID testing was pending at the time of this note.   Review of Systems: As Greer HPI otherwise 10 point review of systems negative.   Past Medical History:  Diagnosis Date  . Diabetes mellitus without complication ([place]) 1478  . Glaucoma 2015  . Hyperlipidemia   . Hypertension 2005    Past Surgical History:  Procedure Laterality Date  . NO PAST SURGERIES       reports  that he has been smoking cigars. His smokeless tobacco use includes chew. He reports current alcohol use. He reports previous drug use. Drug: Marijuana.  Allergies  Allergen Reactions  . Bee Venom Hives, Itching and Swelling    Family History  Problem Relation Age of Onset  . Stroke Mother   . Diabetes Mother        Toward end of life     Prior to Admission medications   Medication Sig Start Date End Date Taking? Authorizing Provider  He is not taking any medications.    Physical Exam: Constitutional: NAD, calm, comfortable Vitals:   06/03/19 2123 06/03/19 2350 06/04/19 0200 06/04/19 0402  BP:  (!) 159/89 (!) 158/133 (!) 160/105  Pulse:  81 82 86  Resp:  20 18 18   Temp:  98.3 F (36.8 C) 97.9 F (36.6 C) 98.7 F (37.1 C)  TempSrc:  Oral Oral Oral  SpO2:  100% 100% 99%  Weight: 102.1 kg     Height: 6' 3"  (1.905 m)      Eyes: lids normal, he has darkened sclerae which are chronic.  He has a small fluid filled cyst at the lower right eyelid ENMT: Mucous membranes are moist.  Neck is supple Respiratory: CTAB, no wheezing or rales Cardiovascular: RR,  NR, no murmur, he has edema which is non pitting of the left lower extremity. Pulse palpable in the DP on the right and radial bilaterally.  Due to swelling, pulse not palpable on the DP on the left, however, foot was warm Abdomen: + BS, NT, ND Musculoskeletal: no clubbing / cyanosis. Normal muscle tone.  He has a wound on the left great toe Skin: no rashes on exposed skin.  He has a quarter sized wound on the medial left great toe associated with warmth, swelling and tenderness.  He does have dusky discoloration to the left great toe Neurologic: Sensation intact to touch on the feet.  He is able to move easily in bed.  Psychiatric: Normal judgment and insight. Alert and oriented x 3. Normal mood.    Labs on Admission: I have personally reviewed following labs and imaging studies  CBC: Recent Labs  Lab 06/03/19 2158  WBC  8.0  NEUTROABS 5.8  HGB 12.7*  HCT 39.0  MCV 88.2  PLT 882   Basic Metabolic Panel: Recent Labs  Lab 06/03/19 2158  NA 136  K 3.8  CL 98  CO2 28  GLUCOSE 196*  BUN 14  CREATININE 1.22  CALCIUM 9.6   GFR: Estimated Creatinine Clearance: 83.3 mL/min (by C-G formula based on SCr of 1.22 mg/dL). Liver Function Tests: Recent Labs  Lab 06/03/19 2158  AST 15  ALT 15  ALKPHOS 65  BILITOT 0.4  PROT 7.7  ALBUMIN 3.4*   No results for input(s): LIPASE, AMYLASE in the last 168 hours. No results for input(s): AMMONIA in the last 168 hours. Coagulation Profile: No results for input(s): INR, PROTIME in the last 168 hours. Cardiac Enzymes: No results for input(s): CKTOTAL, CKMB, CKMBINDEX, TROPONINI in the last 168 hours. BNP (last 3 results) No results for input(s): PROBNP in the last 8760 hours. HbA1C: No results for input(s): HGBA1C in the last 72 hours. CBG: No results for input(s): GLUCAP in the last 168 hours. Lipid Profile: No results for input(s): CHOL, HDL, LDLCALC, TRIG, CHOLHDL, LDLDIRECT in the last 72 hours. Thyroid Function Tests: No results for input(s): TSH, T4TOTAL, FREET4, T3FREE, THYROIDAB in the last 72 hours. Anemia Panel: No results for input(s): VITAMINB12, FOLATE, FERRITIN, TIBC, IRON, RETICCTPCT in the last 72 hours. Urine analysis:    Component Value Date/Time   COLORURINE YELLOW 06/03/2019 2156   APPEARANCEUR CLEAR 06/03/2019 2156   LABSPEC 1.019 06/03/2019 2156   PHURINE 5.0 06/03/2019 2156   GLUCOSEU 50 (A) 06/03/2019 2156   HGBUR SMALL (A) 06/03/2019 2156   BILIRUBINUR NEGATIVE 06/03/2019 2156   KETONESUR NEGATIVE 06/03/2019 2156   PROTEINUR >=300 (A) 06/03/2019 2156   NITRITE NEGATIVE 06/03/2019 2156   LEUKOCYTESUR NEGATIVE 06/03/2019 2156    Radiological Exams on Admission: Dg Foot Complete Left  Result Date: 06/03/2019 CLINICAL DATA:  Wound on great toe, left foot swelling, diabetic EXAM: LEFT FOOT - COMPLETE 3+ VIEW COMPARISON:   05/14/2019 FINDINGS: Fragmentation/destruction of the 1st distal phalanx, new from the recent prior, with overlying soft tissue wound/ulceration. This appearance is compatible with acute osteomyelitis. Mild degenerative changes of the 1st MTP joint. Mild soft tissue swelling. IMPRESSION: Destructive changes involving the 1st distal phalanx, new from the recent prior, reflecting acute osteomyelitis. Electronically Signed   By: Julian Hy M.D.   On: 06/03/2019 22:00    Assessment/Plan Osteomyelitis Diabetic foot wound - Started on rocephin and flagyl Greer IDSA recommendations, unknown MRSA colonization - Check MRSA swab - Consult Orthopedics in the  AM to discuss possible surgical debridement - wound care - Consult for ABIs - Tylenol and oxycodone for pain - BC X 2 pending - Trend WBC - Consult to case management, social work for assistance with medications - Check ESR  Diabetes mellitus without complication  - Started SSI - Check A1C - Carb modified diet  Hypertension - Restarted lisinopril at 53m daily given elevated BP and proteinuria  Would counsel about tobacco cessation prior to discharge.  He is not a daily ETOH user, never had withdrawal.       DVT prophylaxis: Heparin SQ in case of surgery  Code Status: Full  Disposition Plan: Admit for IV Abx  Consults called: Will need orthopedics vs. Vascular in the AM  Admission status: INP, med surg    EGilles ChiquitoMD [facility]   If 7PM-7AM, please contact night-coverage [email or web address] Password [email or web address]  06/04/2019, 6:22 AM

## 2019-06-04 NOTE — Consult Note (Addendum)
ORTHOPAEDIC CONSULTATION  REQUESTING PHYSICIAN: Little Ishikawa, MD  Chief Complaint: Ulceration purulent drainage pain and osteomyelitis left great toe.  HPI: Scott Greer is a 63 y.o. male who presents with osteomyelitis with purulent drainage ulceration and pain of the left great toe.  Patient is diabetic he states he has not used his diabetic medicines in a while since he could not afford them.  He states he did pick up a prescription for antibiotics at [facility] and has not had any improvement after taking oral antibiotics.  Patient states [facility] has [facility] stating [facility] wanted a [facility].  Ulcer is been present for over a month.  Patient is also been treating the ulcer with alcohol irrigation.  Past Medical History:  Diagnosis Date  . Diabetes mellitus without complication ([place]) 6160  . Glaucoma 2015  . Hyperlipidemia   . Hypertension 2005   Past Surgical History:  Procedure Laterality Date  . NO PAST SURGERIES     Social History   Socioeconomic History  . Marital status: Legally Separated    Spouse name: Nira Conn  . Number of children: 2  . Years of education: Not on file  . Highest education level: Associate degree: occupational, Hotel manager, or vocational program  Occupational History  . Not on file  Social Needs  . Financial resource strain: Not on file  . Food insecurity    Worry: Not on file    Inability: Not on file  . Transportation needs    Medical: Not on file    Non-medical: Not on file  Tobacco Use  . Smoking status: Current Every Day Smoker    Types: Cigars  . Smokeless tobacco: Current User    Types: Chew  . Tobacco comment: 4 Black and mild daily.  Quit cigarettes in 2007  Substance and Sexual Activity  . Alcohol use: Yes    Comment: pint weekly  . Drug use: Not Currently    Types: Marijuana  . Sexual activity: Not on file  Lifestyle  . Physical activity    Days per week: Not on file    Minutes per  session: Not on file  . Stress: Not on file  Relationships  . Social Herbalist on phone: Not on file    Gets together: Not on file    Attends religious service: Not on file    Active member of club or organization: Not on file    Attends meetings of clubs or organizations: Not on file    Relationship status: Not on file  Other Topics Concern  . Not on file  Social History Narrative   Out of prison for 2 months.   Lives at home with his wife.   Family History  Problem Relation Age of Onset  . Stroke Mother   . Diabetes Mother        Toward end of life   - negative except otherwise stated in the family history section Allergies  Allergen Reactions  . Bee Venom Hives, Itching and Swelling   Prior to Admission medications   Medication Sig Start Date End Date Taking? Authorizing Provider  omega-3 acid ethyl esters (LOVAZA) 1 g capsule Take 2 g by mouth daily.   Yes [provider]   Dg Foot Complete Left  Result Date: 06/03/2019 CLINICAL DATA:  Wound on great toe, left foot swelling, diabetic EXAM: LEFT FOOT - COMPLETE 3+ VIEW COMPARISON:  05/14/2019 FINDINGS: Fragmentation/destruction  of the 1st distal phalanx, new from the recent prior, with overlying soft tissue wound/ulceration. This appearance is compatible with acute osteomyelitis. Mild degenerative changes of the 1st MTP joint. Mild soft tissue swelling. IMPRESSION: Destructive changes involving the 1st distal phalanx, new from the recent prior, reflecting acute osteomyelitis. Electronically Signed   By: Sriyesh  Krishnan M.D.   On: 06/03/2019 22:00   - pertinent xrays, CT, MRI studies were reviewed and independently interpreted  Positive ROS: All other systems have been reviewed and were otherwise negative with the exception of those mentioned in the HPI and as above.  Physical Exam: General: Alert, no acute distress Psychiatric: Patient is competent for consent with normal mood and affect Lymphatic:  No axillary or cervical lymphadenopathy Cardiovascular: No pedal edema Respiratory: No cyanosis, no use of accessory musculature GI: No organomegaly, abdomen is soft and non-tender    Images:  @ENCIMAGES@  Labs:  Lab Results  Component Value Date   HGBA1C 10.3 (H) 06/04/2019   ESRSEDRATE 78 (H) 06/04/2019   CRP 4.5 (H) 06/04/2019    Lab Results  Component Value Date   ALBUMIN 3.4 (L) 06/03/2019   ALBUMIN 3.6 05/14/2019   PREALBUMIN 13.8 (L) 06/04/2019    Neurologic: Patient does not have protective sensation bilateral lower extremities.   MUSCULOSKELETAL:   Skin: Examination patient has necrotic ulcer of the left great toe there is no ascending cellulitis he does have swelling in the left lower extremity.  Patient has a palpable dorsalis pedis and posterior tibial pulse tenderness to palpation over the tuft of the great toe.  Review of the radiographs shows destruction of the tuft of the great toe consistent with chronic osteomyelitis.  Patient has a hemoglobin A1c of 10.3 with a pre-albumin of 13.8 consistent with uncontrolled diabetes and protein caloric malnutrition.  Assessment: Assessment: Diabetic insensate neuropathy with osteomyelitis ulceration and abscess of the left great toe.  Plan: Plan: We will plan for an amputation of the left great toe through the MTP joint tomorrow Friday.  Risks and benefits were discussed including risk of the wound not healing.  Patient states he understands wished to proceed at this time patient states he will have to return to work as soon as possible to be able to pay his bills.  Discussed that we would need to keep his weight off the foot for at least a week or 2.  Told patient to be n.p.o. after midnight.  Thank you for the consult and the opportunity to see Scott Greer  Nil Xiong, MD [facility] [phone] 4:27 PM     

## 2019-06-04 NOTE — ED Notes (Signed)
Carb Modified Diet was ordered for Lunch. 

## 2019-06-04 NOTE — ED Notes (Signed)
Pt making her own calls to family  Alert oriented.. swollen lt foot for weeks  Injured approx 5 weeks ago  Getting worse  Swollen redness  Good pedal pulse dopplered

## 2019-06-04 NOTE — Consult Note (Signed)
Reason for Consult:Left great toe ulcer Referring Physician: W Solon Pal[place]aster  [place] L Greer is an 63 y.o. male.  HPI: Scott CatchingsCraig comes in with a left great toe ulcer that's been there for a little over a month. He thinks it started from his toe rubbing in his boot. He doctored it at home but it never seemed to get any better. He was seen 7/10 and was recommended admission for IV abx but refused and left AMA, albeit with a rx for oral abx. He has had increased pain and swelling in that foot and returned for evaluation. X-rays showed osteo in the distal phalanx and orthopedic surgery was consulted. He has not been treating his DM for the past 3 months due to cost issues.  Past Medical History:  Diagnosis Date  . Diabetes mellitus without complication ([facility]) 1999  . Glaucoma 2015  . Hyperlipidemia   . Hypertension 2005    Past Surgical History:  Procedure Laterality Date  . NO PAST SURGERIES      Family History  Problem Relation Age of Onset  . Stroke Mother   . Diabetes Mother        Toward end of life    Social History:  reports that he has been smoking cigars. His smokeless tobacco use includes chew. He reports current alcohol use. He reports previous drug use. Drug: Marijuana.  Allergies:  Allergies  Allergen Reactions  . Bee Venom Hives, Itching and Swelling    Medications: I have reviewed the patient's current medications.  Results for orders placed or performed during the hospital encounter of 06/03/19 (from the past 48 hour(s))  Urinalysis, Routine w reflex microscopic     Status: Abnormal   Collection Time: 06/03/19  9:56 PM  Result Value Ref Range   Color, Urine YELLOW YELLOW   APPearance CLEAR CLEAR   Specific Gravity, Urine 1.019 1.005 - 1.030   pH 5.0 5.0 - 8.0   Glucose, UA 50 (A) NEGATIVE mg/dL   Hgb urine dipstick SMALL (A) NEGATIVE   Bilirubin Urine NEGATIVE NEGATIVE   Ketones, ur NEGATIVE NEGATIVE mg/dL   Protein, ur >=161>=300 (A) NEGATIVE mg/dL   Nitrite NEGATIVE  NEGATIVE   Leukocytes,Ua NEGATIVE NEGATIVE   RBC / HPF 0-5 0 - 5 RBC/hpf   WBC, UA 0-5 0 - 5 WBC/hpf   Bacteria, UA NONE SEEN NONE SEEN   Squamous Epithelial / LPF 0-5 0 - 5   Mucus PRESENT    Hyaline Casts, UA PRESENT     Comment: Performed at [facility], [address] [place], [place] [address]  Comprehensive metabolic panel     Status: Abnormal   Collection Time: 06/03/19  9:58 PM  Result Value Ref Range   Sodium 136 135 - 145 mmol/L   Potassium 3.8 3.5 - 5.1 mmol/L   Chloride 98 98 - 111 mmol/L   CO2 28 22 - 32 mmol/L   Glucose, Bld 196 (H) 70 - 99 mg/dL   BUN 14 6 - 20 mg/dL   Creatinine, Ser 5.401.22 0.61 - 1.24 mg/dL   Calcium 9.6 8.9 - 98.110.3 mg/dL   Total Protein 7.7 6.5 - 8.1 g/dL   Albumin 3.4 (L) 3.5 - 5.0 g/dL   AST 15 15 - 41 U/L   ALT 15 0 - 44 U/L   Alkaline Phosphatase 65 38 - 126 U/L   Total Bilirubin 0.4 0.3 - 1.2 mg/dL   GFR calc non Af Amer >60 >60 mL/min   GFR calc  Af Amer >60 >60 mL/min   Anion gap 10 5 - 15    Comment: Performed at [facility], [address] [place], [place] [address]  CBC with Differential     Status: Abnormal   Collection Time: 06/03/19  9:58 PM  Result Value Ref Range   WBC 8.0 4.0 - 10.5 K/uL   RBC 4.42 4.22 - 5.81 MIL/uL   Hemoglobin 12.7 (L) 13.0 - 17.0 g/dL   HCT 19.139.0 47.839.0 - 29.552.0 %   MCV 88.2 80.0 - 100.0 fL   MCH 28.7 26.0 - 34.0 pg   MCHC 32.6 30.0 - 36.0 g/dL   RDW 62.113.2 30.811.5 - 65.715.5 %   Platelets 379 150 - 400 K/uL   nRBC 0.0 0.0 - 0.2 %   Neutrophils Relative % 72 %   Neutro Abs 5.8 1.7 - 7.7 K/uL   Lymphocytes Relative 15 %   Lymphs Abs 1.2 0.7 - 4.0 K/uL   Monocytes Relative 11 %   Monocytes Absolute 0.9 0.1 - 1.0 K/uL   Eosinophils Relative 2 %   Eosinophils Absolute 0.1 0.0 - 0.5 K/uL   Basophils Relative 0 %   Basophils Absolute 0.0 0.0 - 0.1 K/uL   Immature Granulocytes 0 %   Abs Immature Granulocytes 0.02 0.00 - 0.07 K/uL    Comment: Performed at [facility], [address]  [place], [place] [address]  Lactic acid     Status: None   Collection Time: 06/04/19  5:15 AM  Result Value Ref Range   Lactic Acid, Venous 0.9 0.5 - 1.9 mmol/L    Comment: Performed at [facility], [address] [place], [place] [address]  SARS Coronavirus 2 (CEPHEID - Performed in [facility] hospital lab), Hosp Order     Status: None   Collection Time: 06/04/19  5:46 AM   Specimen: Nasopharyngeal Swab  Result Value Ref Range   SARS Coronavirus 2 NEGATIVE NEGATIVE    Comment: (NOTE) If result is NEGATIVE SARS-CoV-2 target nucleic acids are NOT DETECTED. The SARS-CoV-2 RNA is generally detectable in upper and lower  respiratory specimens during the acute phase of infection. The lowest  concentration of SARS-CoV-2 viral copies this assay can detect is 250  copies / mL. A negative result does not preclude SARS-CoV-2 infection  and should not be used as the sole basis for treatment or other  patient management decisions.  A negative result may occur with  improper specimen collection / handling, submission of specimen other  than nasopharyngeal swab, presence of viral mutation(s) within the  areas targeted by this assay, and inadequate number of viral copies  (<250 copies / mL). A negative result must be combined with clinical  observations, patient history, and epidemiological information. If result is POSITIVE SARS-CoV-2 target nucleic acids are DETECTED. The SARS-CoV-2 RNA is generally detectable in upper and lower  respiratory specimens dur ing the acute phase of infection.  Positive  results are indicative of active infection with SARS-CoV-2.  Clinical  correlation with patient history and other diagnostic information is  necessary to determine patient infection status.  Positive results do  not rule out bacterial infection or co-infection with other viruses. If result is PRESUMPTIVE POSTIVE SARS-CoV-2 nucleic acids MAY BE PRESENT.   A presumptive positive result was  obtained on the submitted specimen  and confirmed on repeat testing.  While 2019 novel coronavirus  (SARS-CoV-2) nucleic acids may be present in the submitted sample  additional confirmatory  testing may be necessary for epidemiological  and / or clinical management purposes  to differentiate between  SARS-CoV-2 and other Sarbecovirus currently known to infect humans.  If clinically indicated additional testing with an alternate test  methodology [phone]) is advised. The SARS-CoV-2 RNA is generally  detectable in upper and lower respiratory sp ecimens during the acute  phase of infection. The expected result is Negative. Fact Sheet for Patients:  StrictlyIdeas.no Fact Sheet for Healthcare Providers: BankingDealers.co.za This test is not yet approved or cleared by the [place] FDA and has been authorized for detection and/or diagnosis of SARS-CoV-2 by FDA under an Emergency Use Authorization (EUA).  This EUA will remain in effect (meaning this test can be used) for the duration of the COVID-19 declaration under Section 564(b)(1) of the Act, 21 U.S.C. section 360bbb-3(b)(1), unless the authorization is terminated or revoked sooner. Performed at [facility], [place] [address] [place], [place] [address]   Hemoglobin A1c     Status: Abnormal   Collection Time: 06/04/19  8:24 AM  Result Value Ref Range   Hgb A1c MFr Bld 10.3 (H) 4.8 - 5.6 %    Comment: (NOTE) Pre diabetes:          5.7%-6.4% Diabetes:              >6.4% Glycemic control for   <7.0% adults with diabetes    Mean Plasma Glucose 248.91 mg/dL    Comment: Performed at [address], [place], [place] [address]    Dg Foot Complete Left  Result Date: 06/03/2019 CLINICAL DATA:  Wound on great toe, left foot swelling, diabetic EXAM: LEFT FOOT - COMPLETE 3+ VIEW COMPARISON:  05/14/2019 FINDINGS: Fragmentation/destruction of the 1st distal phalanx, new  from the recent prior, with overlying soft tissue wound/ulceration. This appearance is compatible with acute osteomyelitis. Mild degenerative changes of the 1st MTP joint. Mild soft tissue swelling. IMPRESSION: Destructive changes involving the 1st distal phalanx, new from the recent prior, reflecting acute osteomyelitis. Electronically Signed   By: Julian Hy M.D.   On: 06/03/2019 22:00    Review of Systems  Constitutional: Negative for chills, fever and weight loss.  HENT: Negative for ear discharge, ear pain, hearing loss and tinnitus.   Eyes: Negative for blurred vision, double vision, photophobia and pain.  Respiratory: Negative for cough, sputum production and shortness of breath.   Cardiovascular: Negative for chest pain.  Gastrointestinal: Negative for abdominal pain, nausea and vomiting.  Genitourinary: Negative for dysuria, flank pain, frequency and urgency.  Musculoskeletal: Positive for joint pain (Left great toe). Negative for back pain, falls, myalgias and neck pain.  Neurological: Negative for dizziness, tingling, sensory change, focal weakness, loss of consciousness and headaches.  Endo/Heme/Allergies: Does not bruise/bleed easily.  Psychiatric/Behavioral: Negative for depression, memory loss and substance abuse. The patient is not nervous/anxious.    Blood pressure (!) 141/103, pulse 87, temperature 97.7 F (36.5 C), temperature source Oral, resp. rate 16, height 6\' 3"  (1.905 m), weight 102.1 kg, SpO2 100 %. Physical Exam  Constitutional: He appears well-developed and well-nourished. No distress.  HENT:  Head: Normocephalic and atraumatic.  Eyes: Conjunctivae are normal. Right eye exhibits no discharge. Left eye exhibits no discharge. No scleral icterus.  Neck: Normal range of motion.  Cardiovascular: Normal rate and regular rhythm.  Respiratory: Effort normal. No respiratory distress.  Musculoskeletal:     Comments: LLE No traumatic wounds, ecchymosis, or  rash  Ulceration tip of great toe,  mild foul odor  No knee or ankle effusion  Knee stable to varus/ valgus and anterior/posterior stress  Sens DPN, SPN, TN intact  Motor EHL, ext, flex, evers 5/5  DP 0, PT 0 (dopplerable), 2+ pitting edema  Neurological: He is alert.  Skin: Skin is warm and dry. He is not diaphoretic.  Psychiatric: He has a normal mood and affect. His behavior is normal.    Assessment/Plan: Left great toe ulcer with osteo -- Have recommended amputation but pt is not sure about that. Discussed risks/benefits. Will check ABI given pulse deficit. Dr. Lajoyce Corners to evaluate either later today or in AM. Untreated DM, HTN -- per primary service    Freeman Caldron, PA-C Orthopedic Surgery [phone] 06/04/2019, 10:08 AM

## 2019-06-05 ENCOUNTER — Encounter (HOSPITAL_COMMUNITY): Payer: Self-pay | Admitting: *Deleted

## 2019-06-05 ENCOUNTER — Encounter (HOSPITAL_COMMUNITY)
Admission: EM | Disposition: A | Discharge status: Home Health | Payer: Self-pay | Source: Home / Self Care | Attending: Family Medicine

## 2019-06-05 ENCOUNTER — Inpatient Hospital Stay (HOSPITAL_COMMUNITY): Payer: Self-pay | Admitting: Anesthesiology

## 2019-06-05 DIAGNOSIS — M86272 Subacute osteomyelitis, left ankle and foot: Secondary | ICD-10-CM

## 2019-06-05 HISTORY — PX: AMPUTATION: SHX166

## 2019-06-05 LAB — CBC
HCT: 33.7 % — ABNORMAL LOW (ref 39.0–52.0)
Hemoglobin: 11.3 g/dL — ABNORMAL LOW (ref 13.0–17.0)
MCH: 29.4 pg (ref 26.0–34.0)
MCHC: 33.5 g/dL (ref 30.0–36.0)
MCV: 87.5 fL (ref 80.0–100.0)
Platelets: 277 10*3/uL (ref 150–400)
RBC: 3.85 MIL/uL — ABNORMAL LOW (ref 4.22–5.81)
RDW: 13.1 % (ref 11.5–15.5)
WBC: 11.6 10*3/uL — ABNORMAL HIGH (ref 4.0–10.5)
nRBC: 0 % (ref 0.0–0.2)

## 2019-06-05 LAB — BASIC METABOLIC PANEL
Anion gap: 9 (ref 5–15)
BUN: 12 mg/dL (ref 6–20)
CO2: 24 mmol/L (ref 22–32)
Calcium: 8.9 mg/dL (ref 8.9–10.3)
Chloride: 100 mmol/L (ref 98–111)
Creatinine, Ser: 1.36 mg/dL — ABNORMAL HIGH (ref 0.61–1.24)
GFR calc Af Amer: >60 mL/min (ref >60)
GFR calc non Af Amer: 56 mL/min — ABNORMAL LOW (ref >60)
Glucose, Bld: 156 mg/dL — ABNORMAL HIGH (ref 70–99)
Potassium: 4.2 mmol/L (ref 3.5–5.1)
Sodium: 133 mmol/L — ABNORMAL LOW (ref 135–145)

## 2019-06-05 LAB — GLUCOSE, CAPILLARY
Glucose-Capillary: 148 mg/dL — ABNORMAL HIGH (ref 70–99)
Glucose-Capillary: 128 mg/dL — ABNORMAL HIGH (ref 70–99)
Glucose-Capillary: 146 mg/dL — ABNORMAL HIGH (ref 70–99)
Glucose-Capillary: 291 mg/dL — ABNORMAL HIGH (ref 70–99)

## 2019-06-05 LAB — HIV ANTIBODY (ROUTINE TESTING W REFLEX): HIV Screen 4th Generation wRfx: NONREACTIVE

## 2019-06-05 SURGERY — AMPUTATION DIGIT
Anesthesia: Regional | Laterality: Left

## 2019-06-05 MED ORDER — LACTATED RINGERS IV SOLN: INTRAVENOUS | Status: DC

## 2019-06-05 MED ORDER — OXYCODONE HCL 5 MG PO TABS
5.0000 mg | ORAL_TABLET | ORAL | Status: DC | PRN
  Administered 2019-06-05: 5 mg via ORAL
  Administered 2019-06-07 – 2019-06-08 (×2): 10 mg via ORAL
  Filled 2019-06-05: qty 2
  Filled 2019-06-05: qty 1
  Filled 2019-06-05: qty 2

## 2019-06-05 MED ORDER — ACETAMINOPHEN 325 MG PO TABS: 325.0000 mg | ORAL_TABLET | Freq: Four times a day (QID) | ORAL | Status: DC | PRN

## 2019-06-05 MED ORDER — BISACODYL 10 MG RE SUPP: 10.0000 mg | Freq: Every day | RECTAL | Status: DC | PRN

## 2019-06-05 MED ORDER — MIDAZOLAM HCL 2 MG/2ML IJ SOLN
2.0000 mg | Freq: Once | INTRAMUSCULAR | Status: AC
  Administered 2019-06-05: 2 mg via INTRAVENOUS

## 2019-06-05 MED ORDER — SODIUM CHLORIDE 0.9% FLUSH: 10.0000 mL | INTRAVENOUS | Status: DC | PRN

## 2019-06-05 MED ORDER — POVIDONE-IODINE 10 % EX SWAB: 2.0000 "application " | Freq: Once | CUTANEOUS | Status: DC

## 2019-06-05 MED ORDER — ENSURE PRE-SURGERY PO LIQD: 296.0000 mL | Freq: Once | ORAL | Status: DC

## 2019-06-05 MED ORDER — OXYCODONE HCL 5 MG PO TABS
10.0000 mg | ORAL_TABLET | ORAL | Status: DC | PRN
  Administered 2019-06-07: 15 mg via ORAL
  Filled 2019-06-05: qty 3

## 2019-06-05 MED ORDER — METHOCARBAMOL 500 MG PO TABS
ORAL_TABLET | ORAL | Status: AC
  Filled 2019-06-05: qty 1

## 2019-06-05 MED ORDER — MIDAZOLAM HCL 2 MG/2ML IJ SOLN
INTRAMUSCULAR | Status: AC
  Filled 2019-06-05: qty 2

## 2019-06-05 MED ORDER — ONDANSETRON HCL 4 MG/2ML IJ SOLN: 4.0000 mg | Freq: Once | INTRAMUSCULAR | Status: DC | PRN

## 2019-06-05 MED ORDER — LACTATED RINGERS IV SOLN
INTRAVENOUS | Status: DC | PRN
  Administered 2019-06-05: 12:00:00 via INTRAVENOUS

## 2019-06-05 MED ORDER — 0.9 % SODIUM CHLORIDE (POUR BTL) OPTIME
TOPICAL | Status: DC | PRN
  Administered 2019-06-05: 1000 mL

## 2019-06-05 MED ORDER — CHLORHEXIDINE GLUCONATE 4 % EX LIQD: 60.0000 mL | Freq: Once | CUTANEOUS | Status: DC

## 2019-06-05 MED ORDER — CEFAZOLIN SODIUM-DEXTROSE 2-4 GM/100ML-% IV SOLN
INTRAVENOUS | Status: AC
  Filled 2019-06-05: qty 100

## 2019-06-05 MED ORDER — ROPIVACAINE HCL 5 MG/ML IJ SOLN
INTRAMUSCULAR | Status: DC | PRN
  Administered 2019-06-05: 10 mL via PERINEURAL
  Administered 2019-06-05: 30 mL via PERINEURAL

## 2019-06-05 MED ORDER — STERILE WATER FOR IRRIGATION IR SOLN
Status: DC | PRN
  Administered 2019-06-05: 1000 mL

## 2019-06-05 MED ORDER — SODIUM CHLORIDE 0.9% FLUSH
10.0000 mL | Freq: Two times a day (BID) | INTRAVENOUS | Status: DC
  Administered 2019-06-05 – 2019-06-08 (×4): 10 mL

## 2019-06-05 MED ORDER — FENTANYL CITRATE (PF) 250 MCG/5ML IJ SOLN
INTRAMUSCULAR | Status: AC
  Filled 2019-06-05: qty 5

## 2019-06-05 MED ORDER — ONDANSETRON HCL 4 MG/2ML IJ SOLN
4.0000 mg | Freq: Four times a day (QID) | INTRAMUSCULAR | Status: DC | PRN
  Administered 2019-06-08: 4 mg via INTRAVENOUS
  Filled 2019-06-05: qty 2

## 2019-06-05 MED ORDER — CEFAZOLIN SODIUM-DEXTROSE 2-4 GM/100ML-% IV SOLN
2.0000 g | INTRAVENOUS | Status: AC
  Administered 2019-06-05: 12:00:00 2 g via INTRAVENOUS

## 2019-06-05 MED ORDER — ONDANSETRON HCL 4 MG/2ML IJ SOLN
INTRAMUSCULAR | Status: DC | PRN
  Administered 2019-06-05: 4 mg via INTRAVENOUS

## 2019-06-05 MED ORDER — ACETAMINOPHEN 325 MG PO TABS
ORAL_TABLET | ORAL | Status: AC
  Filled 2019-06-05: qty 2

## 2019-06-05 MED ORDER — POLYETHYLENE GLYCOL 3350 17 G PO PACK: 17.0000 g | PACK | Freq: Every day | ORAL | Status: DC | PRN

## 2019-06-05 MED ORDER — LABETALOL HCL 5 MG/ML IV SOLN
10.0000 mg | Freq: Once | INTRAVENOUS | Status: AC
  Administered 2019-06-05: 10 mg via INTRAVENOUS

## 2019-06-05 MED ORDER — OXYCODONE HCL 5 MG PO TABS
ORAL_TABLET | ORAL | Status: AC
  Filled 2019-06-05: qty 1

## 2019-06-05 MED ORDER — LABETALOL HCL 5 MG/ML IV SOLN
INTRAVENOUS | Status: AC
  Administered 2019-06-05: 10 mg via INTRAVENOUS
  Filled 2019-06-05: qty 4

## 2019-06-05 MED ORDER — ONDANSETRON HCL 4 MG PO TABS: 4.0000 mg | ORAL_TABLET | Freq: Four times a day (QID) | ORAL | Status: DC | PRN

## 2019-06-05 MED ORDER — DOCUSATE SODIUM 100 MG PO CAPS
100.0000 mg | ORAL_CAPSULE | Freq: Two times a day (BID) | ORAL | Status: DC
  Administered 2019-06-05 – 2019-06-08 (×5): 100 mg via ORAL
  Filled 2019-06-05 (×6): qty 1

## 2019-06-05 MED ORDER — MAGNESIUM CITRATE PO SOLN: 1.0000 | Freq: Once | ORAL | Status: DC | PRN

## 2019-06-05 MED ORDER — METHOCARBAMOL 500 MG PO TABS
500.0000 mg | ORAL_TABLET | Freq: Four times a day (QID) | ORAL | Status: DC | PRN
  Administered 2019-06-05 – 2019-06-08 (×4): 500 mg via ORAL
  Filled 2019-06-05 (×3): qty 1

## 2019-06-05 MED ORDER — MIDAZOLAM HCL 2 MG/2ML IJ SOLN
INTRAMUSCULAR | Status: AC
  Administered 2019-06-05: 2 mg via INTRAVENOUS
  Filled 2019-06-05: qty 2

## 2019-06-05 MED ORDER — SODIUM CHLORIDE 0.9 % IV SOLN: INTRAVENOUS | Status: DC

## 2019-06-05 MED ORDER — METHOCARBAMOL 1000 MG/10ML IJ SOLN
500.0000 mg | Freq: Four times a day (QID) | INTRAVENOUS | Status: DC | PRN
  Filled 2019-06-05: qty 5

## 2019-06-05 MED ORDER — FENTANYL CITRATE (PF) 100 MCG/2ML IJ SOLN
INTRAMUSCULAR | Status: AC
  Administered 2019-06-05: 50 ug via INTRAVENOUS
  Filled 2019-06-05: qty 2

## 2019-06-05 MED ORDER — METOCLOPRAMIDE HCL 5 MG/ML IJ SOLN: 5.0000 mg | Freq: Three times a day (TID) | INTRAMUSCULAR | Status: DC | PRN

## 2019-06-05 MED ORDER — FENTANYL CITRATE (PF) 100 MCG/2ML IJ SOLN: 25.0000 ug | INTRAMUSCULAR | Status: DC | PRN

## 2019-06-05 MED ORDER — FENTANYL CITRATE (PF) 100 MCG/2ML IJ SOLN
50.0000 ug | Freq: Once | INTRAMUSCULAR | Status: AC
  Administered 2019-06-05: 11:00:00 50 ug via INTRAVENOUS

## 2019-06-05 MED ORDER — PROPOFOL 500 MG/50ML IV EMUL
INTRAVENOUS | Status: DC | PRN
  Administered 2019-06-05: 60 ug/kg/min via INTRAVENOUS

## 2019-06-05 MED ORDER — METOCLOPRAMIDE HCL 5 MG PO TABS: 5.0000 mg | ORAL_TABLET | Freq: Three times a day (TID) | ORAL | Status: DC | PRN

## 2019-06-05 MED ORDER — HYDROMORPHONE HCL 1 MG/ML IJ SOLN: 0.5000 mg | INTRAMUSCULAR | Status: DC | PRN

## 2019-06-05 SURGICAL SUPPLY — 31 items
BLADE SURG 21 STRL SS (BLADE) ×3 IMPLANT
BNDG CMPR 9X4 STRL LF SNTH (GAUZE/BANDAGES/DRESSINGS)
BNDG COHESIVE 4X5 TAN STRL (GAUZE/BANDAGES/DRESSINGS) ×3 IMPLANT
BNDG ESMARK 4X9 LF (GAUZE/BANDAGES/DRESSINGS) IMPLANT
BNDG GAUZE ELAST 4 BULKY (GAUZE/BANDAGES/DRESSINGS) ×3 IMPLANT
COVER SURGICAL LIGHT HANDLE (MISCELLANEOUS) ×4 IMPLANT
COVER WAND RF STERILE (DRAPES) ×1 IMPLANT
DRAPE U-SHAPE 47X51 STRL (DRAPES) ×3 IMPLANT
DRSG ADAPTIC 3X8 NADH LF (GAUZE/BANDAGES/DRESSINGS) ×2 IMPLANT
DRSG PAD ABDOMINAL 8X10 ST (GAUZE/BANDAGES/DRESSINGS) ×3 IMPLANT
DURAPREP 26ML APPLICATOR (WOUND CARE) ×3 IMPLANT
ELECT REM PT RETURN 9FT ADLT (ELECTROSURGICAL) ×3
ELECTRODE REM PT RTRN 9FT ADLT (ELECTROSURGICAL) ×1 IMPLANT
GAUZE SPONGE 4X4 12PLY STRL (GAUZE/BANDAGES/DRESSINGS) IMPLANT
GAUZE SPONGE 4X4 12PLY STRL LF (GAUZE/BANDAGES/DRESSINGS) ×2 IMPLANT
GLOVE BIOGEL PI IND STRL 9 (GLOVE) ×1 IMPLANT
GLOVE BIOGEL PI INDICATOR 9 (GLOVE) ×2
GLOVE SURG ORTHO 9.0 STRL STRW (GLOVE) ×3 IMPLANT
GOWN STRL REUS W/ TWL XL LVL3 (GOWN DISPOSABLE) ×2 IMPLANT
GOWN STRL REUS W/TWL XL LVL3 (GOWN DISPOSABLE) ×6
KIT BASIN OR (CUSTOM PROCEDURE TRAY) ×3 IMPLANT
KIT TURNOVER KIT B (KITS) ×3 IMPLANT
MANIFOLD NEPTUNE II (INSTRUMENTS) ×1 IMPLANT
NEEDLE 22X1 1/2 (OR ONLY) (NEEDLE) IMPLANT
NS IRRIG 1000ML POUR BTL (IV SOLUTION) ×3 IMPLANT
PACK ORTHO EXTREMITY (CUSTOM PROCEDURE TRAY) ×3 IMPLANT
PAD ABD 8X10 STRL (GAUZE/BANDAGES/DRESSINGS) ×2 IMPLANT
PAD ARMBOARD 7.5X6 YLW CONV (MISCELLANEOUS) ×2 IMPLANT
SUT ETHILON 2 0 PSLX (SUTURE) ×3 IMPLANT
SYR CONTROL 10ML LL (SYRINGE) IMPLANT
TOWEL GREEN STERILE (TOWEL DISPOSABLE) ×3 IMPLANT

## 2019-06-05 NOTE — Anesthesia Postprocedure Evaluation (Signed)
Anesthesia Post Note  Patient: Scott Greer  Procedure(s) Performed: LEFT GREAT TOE AMPUTATION (Left )     Patient location during evaluation: PACU Anesthesia Type: Regional Level of consciousness: awake and alert Pain management: pain level controlled Vital Signs Assessment: post-procedure vital signs reviewed and stable Respiratory status: spontaneous breathing, nonlabored ventilation and respiratory function stable Cardiovascular status: stable and blood pressure returned to baseline Postop Assessment: no apparent nausea or vomiting Anesthetic complications: no    Last Vitals:  Vitals:   06/05/19 1330 06/05/19 1335  BP:  (!) 154/93  Pulse: 88 90  Resp: 18 15  Temp:    SpO2: 99% 100%    Last Pain:  Vitals:   06/05/19 1225  TempSrc:   PainSc: 0-No pain                 Catalina Gravel

## 2019-06-05 NOTE — Op Note (Signed)
06/03/2019 - 06/05/2019  12:10 PM  PATIENT:  Scott Greer    PRE-OPERATIVE DIAGNOSIS:  Osteomyelitis Left Great Toe  POST-OPERATIVE DIAGNOSIS:  Same  PROCEDURE:  LEFT GREAT TOE AMPUTATION  SURGEON:  Newt Minion, MD  PHYSICIAN ASSISTANT:None ANESTHESIA:   General  PREOPERATIVE INDICATIONS:  Scott Greer is a  63 y.o. male with a diagnosis of Osteomyelitis Left Great Toe who failed conservative measures and elected for surgical management.    The risks benefits and alternatives were discussed with the patient preoperatively including but not limited to the risks of infection, bleeding, nerve injury, cardiopulmonary complications, the need for revision surgery, among others, and the patient was willing to proceed.  OPERATIVE IMPLANTS: None  @ENCIMAGES @  OPERATIVE FINDINGS: no abscess at the MTP joint  OPERATIVE PROCEDURE: Patient was brought to the operating room after undergoing a regional block.  After adequate levels anesthesia were obtained patient's left lower extremity was prepped using DuraPrep draped into a sterile field a timeout was called.  A fishmouth incision was made just distal to the MTP joint.  The left great toe was amputated through the MTP joint electrocautery was used for hemostasis the wound was irrigated with normal saline there is no abscess or signs of infection at the amputation site.  The incision was closed using 2-0 nylon a sterile dressing was applied patient was taken the PACU in stable condition.   DISCHARGE PLANNING:  Antibiotic duration:23 hours post op  Weightbearing: TDWB left  Pain medication: opoid pathway  Dressing care/ Wound ZOX:WRUE dressing dry  Ambulatory devices:walker  Discharge to: home when safe with PT  Follow-up: In the office 1 week post operative.

## 2019-06-05 NOTE — Transfer of Care (Signed)
Immediate Anesthesia Transfer of Care Note  Patient: MAVRYK PINO  Procedure(s) Performed: LEFT GREAT TOE AMPUTATION (Left )  Patient Location: PACU  Anesthesia Type:MAC  Level of Consciousness: awake, alert , oriented and patient cooperative  Airway & Oxygen Therapy: Patient Spontanous Breathing and Patient connected to nasal cannula oxygen  Post-op Assessment: Report given to RN  Post vital signs: Reviewed and stable  Last Vitals:  Vitals Value Taken Time  BP 173/105 06/05/19 1221  Temp    Pulse 89 06/05/19 1223  Resp 16 06/05/19 1223  SpO2 100 % 06/05/19 1223  Vitals shown include unvalidated device data.  Last Pain:  Vitals:   06/05/19 0809  TempSrc:   PainSc: 10-Worst pain ever      Patients Stated Pain Goal: 2 (01/60/10 9323)  Complications: No apparent anesthesia complications

## 2019-06-05 NOTE — Progress Notes (Signed)
PROGRESS NOTE    DETRICH RAKESTRAW  DGU:[id number] DOB: [birth year] DOA: 06/03/2019 PCP: Patient, No Pcp Per   Brief Narrative:  Scott Greer is a 63 y.o. male with medical history significant of DM2, HTN who presents with a foot wound and leg swelling.  Scott Greer reports that about 1 month ago, he developed a blister on the lateral aspect of his left great toe due to rubbing from his work boots.  He made a slice into the blister and then peeled the skin off.  Since that time it has slowly become more infected.  He presented to the ED on 7/9 and was advised to be admitted for IV antibiotics, but he preferred to go home with PO antibiotics.  He did pick up that prescription and completed the antibiotics; however, the wound has gotten worse.  Over the last week he has had increased pain in the left leg and foot, increased swelling of the left ankle and lower leg and warmth of that leg.  He has had no fever, chills, chest pain, cough, sputum production.  He has not had any medications for DM or HTN in the last 3 months.  He was going to the [employer], but due to [place], he was not able to go in for appointments.  He also does not have insurance or ways to regularly afford his medications, as he does not get a regular paycheck.  He has not had surgery before and would prefer not to have any surgery for this issue. In the ED, he had an xray which showed likely osteomyelitis.  Assessment & Plan:   Active Problems:   Diabetes mellitus (HCC)   Hypertension   Osteomyelitis (HCC)  Sepsis 2/2 Osteomyelitis of L great toe, POA, improving Diabetic foot wound - Leukocytosis, tachycardic and L great toe infection meet criteria for sepsis - Started on rocephin and flagyl per IDSA recommendations, unknown MRSA colonization - Ortho following - possible amputation later today - wound care ongoing - Consult for ABIs - Tylenol and oxycodone for pain - BC X 2 pending - Trend WBC - Consult to case  management, social work for assistance with medications - Check ESR  Diabetes mellitus without complication  - Continue SSI, hypoglycemic protocol - A1C 10.3 - Carb modified diet  Hypertension - Continue lisinopril at 45m daily  DVT prophylaxis: Heparin SQ in case of surgery  Code Status: Full  Disposition Plan: Admit for IV Abx  Consults called: Will need orthopedics vs. Vascular in the AM  Admission status: INP, med surg  Consultants:   Orthopedics: Scott Greer Procedures:   L toe amputation (pending surgical clearance)  Antimicrobials:  Flagyl, Rocephin - 7/30 - ongoing  Subjective: No acute issues or events overnight, pain well controlled, denies fever, cough, shortness of breath, nausea, vomiting, diarrhea, constipation, headache. Somewhat anxious for surgery but otherwise without complaint.  Objective: Vitals:   06/04/19 1238 06/04/19 2015 06/05/19 0534 06/05/19 0546  BP: (!) 174/98 (!) 145/77 (!) 180/103 (!) 184/98  Pulse: 79 99 (!) 103 99  Resp:  18 17 17   Temp: 97.8 F (36.6 C) 98.7 F (37.1 C) (!) 102.6 F (39.2 C)   TempSrc: Oral  Oral   SpO2: 100% 97% 99% 100%  Weight:      Height:        Intake/Output Summary (Last 24 hours) at 06/05/2019 0848 Last data filed at 06/05/2019 0400 Gross per 24 hour  Intake 3040 ml  Output 400 ml  Net 2640 ml   Filed Weights   06/03/19 2123  Weight: 102.1 kg    Examination:  General:  Pleasantly resting in bed, No acute distress. HEENT:  Normocephalic atraumatic.  Sclerae nonicteric, noninjected.  Extraocular movements intact bilaterally. Neck:  Without mass or deformity.  Trachea is midline. Lungs:  Clear to auscultate bilaterally without rhonchi, wheeze, or rales. Heart:  Regular rate and rhythm.  Without murmurs, rubs, or gallops. Abdomen:  Soft, nontender, nondistended.  Without guarding or rebound. Extremities: Left great toe ulceration; mild LLE pitting edema to mid calf; otherwise without cyanosis,  clubbing, edema, or obvious deformity. Vascular:  Dorsalis pedis and posterior tibial pulses palpable bilaterally.   Data Reviewed: I have personally reviewed following labs and imaging studies  CBC: Recent Labs  Lab 06/03/19 2158 06/05/19 0737  WBC 8.0 11.6*  NEUTROABS 5.8  --   HGB 12.7* 11.3*  HCT 39.0 33.7*  MCV 88.2 87.5  PLT 379 893   Basic Metabolic Panel: Recent Labs  Lab 06/03/19 2158  NA 136  K 3.8  CL 98  CO2 28  GLUCOSE 196*  BUN 14  CREATININE 1.22  CALCIUM 9.6   GFR: Estimated Creatinine Clearance: 83.3 mL/min (by C-G formula based on SCr of 1.22 mg/dL). Liver Function Tests: Recent Labs  Lab 06/03/19 2158  AST 15  ALT 15  ALKPHOS 65  BILITOT 0.4  PROT 7.7  ALBUMIN 3.4*   No results for input(s): LIPASE, AMYLASE in the last 168 hours. No results for input(s): AMMONIA in the last 168 hours. Coagulation Profile: No results for input(s): INR, PROTIME in the last 168 hours. Cardiac Enzymes: No results for input(s): CKTOTAL, CKMB, CKMBINDEX, TROPONINI in the last 168 hours. BNP (last 3 results) No results for input(s): PROBNP in the last 8760 hours. HbA1C: Recent Labs    06/04/19 0824  HGBA1C 10.3*   CBG: Recent Labs  Lab 06/04/19 1244 06/04/19 1707 06/04/19 2058 06/05/19 0744  GLUCAP 227* 236* 147* 148*   Lipid Profile: No results for input(s): CHOL, HDL, LDLCALC, TRIG, CHOLHDL, LDLDIRECT in the last 72 hours. Thyroid Function Tests: No results for input(s): TSH, T4TOTAL, FREET4, T3FREE, THYROIDAB in the last 72 hours. Anemia Panel: No results for input(s): VITAMINB12, FOLATE, FERRITIN, TIBC, IRON, RETICCTPCT in the last 72 hours. Sepsis Labs: Recent Labs  Lab 06/04/19 0515  LATICACIDVEN 0.9    Recent Results (from the past 240 hour(s))  SARS Coronavirus 2 (CEPHEID - Performed in [facility] hospital lab), Hosp Order     Status: None   Collection Time: 06/04/19  5:46 AM   Specimen: Nasopharyngeal Swab  Result Value Ref  Range Status   SARS Coronavirus 2 NEGATIVE NEGATIVE Final    Comment: (NOTE) If result is NEGATIVE SARS-CoV-2 target nucleic acids are NOT DETECTED. The SARS-CoV-2 RNA is generally detectable in upper and lower  respiratory specimens during the acute phase of infection. The lowest  concentration of SARS-CoV-2 viral copies this assay can detect is 250  copies / mL. A negative result does not preclude SARS-CoV-2 infection  and should not be used as the sole basis for treatment or other  patient management decisions.  A negative result may occur with  improper specimen collection / handling, submission of specimen other  than nasopharyngeal swab, presence of viral mutation(s) within the  areas targeted by this assay, and inadequate number of viral copies  (<250 copies / mL). A negative result must be combined with clinical  observations, patient history, and epidemiological  information. If result is POSITIVE SARS-CoV-2 target nucleic acids are DETECTED. The SARS-CoV-2 RNA is generally detectable in upper and lower  respiratory specimens dur ing the acute phase of infection.  Positive  results are indicative of active infection with SARS-CoV-2.  Clinical  correlation with patient history and other diagnostic information is  necessary to determine patient infection status.  Positive results do  not rule out bacterial infection or co-infection with other viruses. If result is PRESUMPTIVE POSTIVE SARS-CoV-2 nucleic acids MAY BE PRESENT.   A presumptive positive result was obtained on the submitted specimen  and confirmed on repeat testing.  While 2019 novel coronavirus  (SARS-CoV-2) nucleic acids may be present in the submitted sample  additional confirmatory testing may be necessary for epidemiological  and / or clinical management purposes  to differentiate between  SARS-CoV-2 and other Sarbecovirus currently known to infect humans.  If clinically indicated additional testing with an  alternate test  methodology [phone]) is advised. The SARS-CoV-2 RNA is generally  detectable in upper and lower respiratory sp ecimens during the acute  phase of infection. The expected result is Negative. Fact Sheet for Patients:  StrictlyIdeas.no Fact Sheet for Healthcare Providers: BankingDealers.co.za This test is not yet approved or cleared by the [place] FDA and has been authorized for detection and/or diagnosis of SARS-CoV-2 by FDA under an Emergency Use Authorization (EUA).  This EUA will remain in effect (meaning this test can be used) for the duration of the COVID-19 declaration under Section 564(b)(1) of the Act, 21 U.S.C. section 360bbb-3(b)(1), unless the authorization is terminated or revoked sooner. Performed at [facility], [place] [address], [place], [place] [address]     Radiology Studies: Dg Foot Complete Left  Result Date: 06/03/2019 CLINICAL DATA:  Wound on great toe, left foot swelling, diabetic EXAM: LEFT FOOT - COMPLETE 3+ VIEW COMPARISON:  05/14/2019 FINDINGS: Fragmentation/destruction of the 1st distal phalanx, new from the recent prior, with overlying soft tissue wound/ulceration. This appearance is compatible with acute osteomyelitis. Mild degenerative changes of the 1st MTP joint. Mild soft tissue swelling. IMPRESSION: Destructive changes involving the 1st distal phalanx, new from the recent prior, reflecting acute osteomyelitis. Electronically Signed   By: Julian Hy M.D.   On: 06/03/2019 22:00   Scheduled Meds: . feeding supplement (PRO-STAT SUGAR FREE 64)  30 mL Oral BID  . heparin  5,000 Units Subcutaneous Q8H  . insulin aspart  0-5 Units Subcutaneous QHS  . insulin aspart  0-9 Units Subcutaneous TID WC  . lisinopril  10 mg Oral Daily  . metroNIDAZOLE  500 mg Oral Q8H  . nutrition supplement (JUVEN)  1 packet Oral BID BM   Continuous Infusions: . sodium chloride 125 mL/hr at 06/04/19  2340  . cefTRIAXone (ROCEPHIN)  IV 200 mL/hr at 06/04/19 1700    LOS: 1 day   Time spent: 32mn   Asyah Candler C Myrian Botello, DO [facility]  If 7PM-7AM, please contact night-coverage [email or web address] Password [email or web address]7/31/2020, 8:48 AM

## 2019-06-05 NOTE — Anesthesia Preprocedure Evaluation (Addendum)
Anesthesia Evaluation  Patient identified by MRN, date of birth, ID band Patient awake    Reviewed: Allergy & Precautions, NPO status , Patient's Chart, lab work & pertinent test results  Airway Mallampati: II  TM Distance: >3 FB Neck ROM: Full    Dental  (+) Teeth Intact, Dental Advisory Given   Pulmonary Current Smoker,    Pulmonary exam normal breath sounds clear to auscultation       Cardiovascular hypertension, Pt. on medications Normal cardiovascular exam Rhythm:Regular Rate:Normal     Neuro/Psych negative neurological ROS  negative psych ROS   GI/Hepatic negative GI ROS, Neg liver ROS,   Endo/Other  diabetes, Type 2, Oral Hypoglycemic Agents  Renal/GU negative Renal ROS     Musculoskeletal  Osteomyelitis Left Great Toe   Abdominal   Peds  Hematology  (+) Blood dyscrasia, anemia ,   Anesthesia Other Findings Day of surgery medications reviewed with the patient.  Reproductive/Obstetrics                            Anesthesia Physical Anesthesia Plan  ASA: III  Anesthesia Plan: Regional   Post-op Pain Management:    Induction: Intravenous  PONV Risk Score and Plan: 1 and Propofol infusion and Treatment may vary due to age or medical condition  Airway Management Planned: Nasal Cannula and Natural Airway  Additional Equipment:   Intra-op Plan:   Post-operative Plan:   Informed Consent: I have reviewed the patients History and Physical, chart, labs and discussed the procedure including the risks, benefits and alternatives for the proposed anesthesia with the patient or authorized representative who has indicated his/her understanding and acceptance.     Dental advisory given  Plan Discussed with: CRNA  Anesthesia Plan Comments:         Anesthesia Quick Evaluation

## 2019-06-05 NOTE — Progress Notes (Signed)
Paged Kennon Holter, NP about pt. B/P of 180/103 did not receive a new order.

## 2019-06-05 NOTE — Progress Notes (Signed)
Orthopedic Tech Progress Note Patient Details:  Scott Greer Feb 11, 1956 [id number]  Ortho Devices Type of Ortho Device: Postop shoe/boot Ortho Device/Splint Location: left       [name] 06/05/2019, 6:54 PM

## 2019-06-05 NOTE — Anesthesia Procedure Notes (Signed)
Anesthesia Regional Block: Adductor canal block   Pre-Anesthetic Checklist: ,, timeout performed, Correct Patient, Correct Site, Correct Laterality, Correct Procedure, Correct Position, site marked, Risks and benefits discussed,  Surgical consent,  Pre-op evaluation,  At surgeon's request and post-op pain management  Laterality: Left  Prep: chloraprep       Needles:  Injection technique: Single-shot  Needle Type: Echogenic Needle     Needle Length: 9cm  Needle Gauge: 21     Additional Needles:   Procedures:,,,, ultrasound used (permanent image in chart),,,,  Narrative:  Start time: 06/05/2019 10:35 AM End time: 06/05/2019 10:40 AM Injection made incrementally with aspirations every 5 mL.  Performed by: Personally  Anesthesiologist: Catalina Gravel, MD  Additional Notes: No pain on injection. No increased resistance to injection. Injection made in 5cc increments.  Good needle visualization.  Patient tolerated procedure well.

## 2019-06-05 NOTE — Plan of Care (Signed)
  Problem: Education: Goal: Knowledge of General Education information will improve Description: Including pain rating scale, medication(s)/side effects and non-pharmacologic comfort measures Outcome: Progressing   Problem: Health Behavior/Discharge Planning: Goal: Ability to manage health-related needs will improve Outcome: Progressing   Problem: Clinical Measurements: Goal: Ability to maintain clinical measurements within normal limits will improve Outcome: Progressing Goal: Respiratory complications will improve Outcome: Progressing Goal: Cardiovascular complication will be avoided Outcome: Progressing   Problem: Activity: Goal: Risk for activity intolerance will decrease Outcome: Progressing   Problem: Coping: Goal: Level of anxiety will decrease Outcome: Progressing   Problem: Elimination: Goal: Will not experience complications related to bowel motility Outcome: Progressing Goal: Will not experience complications related to urinary retention Outcome: Progressing   Problem: Pain Managment: Goal: General experience of comfort will improve Outcome: Progressing   Problem: Safety: Goal: Ability to remain free from injury will improve Outcome: Progressing   Problem: Skin Integrity: Goal: Risk for impaired skin integrity will decrease Outcome: Progressing

## 2019-06-05 NOTE — Progress Notes (Signed)
0935; Midline placed in the left upper arm with 18g x 8 cm catheter without difficulty.

## 2019-06-05 NOTE — Anesthesia Procedure Notes (Signed)
Anesthesia Regional Block: Popliteal block   Pre-Anesthetic Checklist: ,, timeout performed, Correct Patient, Correct Site, Correct Laterality, Correct Procedure, Correct Position, site marked, Risks and benefits discussed,  Surgical consent,  Pre-op evaluation,  At surgeon's request and post-op pain management  Laterality: Left  Prep: chloraprep       Needles:  Injection technique: Single-shot  Needle Type: Echogenic Needle     Needle Length: 9cm  Needle Gauge: 21     Additional Needles:   Procedures:,,,, ultrasound used (permanent image in chart),,,,  Narrative:  Start time: 06/05/2019 10:25 AM End time: 06/05/2019 10:35 AM Injection made incrementally with aspirations every 5 mL.  Performed by: Personally  Anesthesiologist: Catalina Gravel, MD  Additional Notes: No pain on injection. No increased resistance to injection. Injection made in 5cc increments.  Good needle visualization.  Patient tolerated procedure well.

## 2019-06-05 NOTE — Interval H&P Note (Signed)
History and Physical Interval Note:  06/05/2019 7:31 AM  Scott Greer  has presented today for surgery, with the diagnosis of Osteomyelitis Left Great Toe.  The various methods of treatment have been discussed with the patient and family. After consideration of risks, benefits and other options for treatment, the patient has consented to  Procedure(s): LEFT GREAT TOE AMPUTATION (Left) as a surgical intervention.  The patient's history has been reviewed, patient examined, no change in status, stable for surgery.  I have reviewed the patient's chart and labs.  Questions were answered to the patient's satisfaction.     Ulysees Barns [facility] [phone]

## 2019-06-06 ENCOUNTER — Inpatient Hospital Stay (HOSPITAL_COMMUNITY): Payer: Self-pay

## 2019-06-06 ENCOUNTER — Encounter (HOSPITAL_COMMUNITY): Payer: Self-pay | Admitting: Orthopedic Surgery

## 2019-06-06 DIAGNOSIS — Z794 Long term (current) use of insulin: Secondary | ICD-10-CM

## 2019-06-06 LAB — CBC
HCT: 31.6 % — ABNORMAL LOW (ref 39.0–52.0)
Hemoglobin: 10.6 g/dL — ABNORMAL LOW (ref 13.0–17.0)
MCH: 29 pg (ref 26.0–34.0)
MCHC: 33.5 g/dL (ref 30.0–36.0)
MCV: 86.3 fL (ref 80.0–100.0)
Platelets: 244 10*3/uL (ref 150–400)
RBC: 3.66 MIL/uL — ABNORMAL LOW (ref 4.22–5.81)
RDW: 13 % (ref 11.5–15.5)
WBC: 8.2 10*3/uL (ref 4.0–10.5)
nRBC: 0 % (ref 0.0–0.2)

## 2019-06-06 LAB — BASIC METABOLIC PANEL
Anion gap: 10 (ref 5–15)
BUN: 15 mg/dL (ref 6–20)
CO2: 23 mmol/L (ref 22–32)
Calcium: 8.3 mg/dL — ABNORMAL LOW (ref 8.9–10.3)
Chloride: 98 mmol/L (ref 98–111)
Creatinine, Ser: 1.45 mg/dL — ABNORMAL HIGH (ref 0.61–1.24)
GFR calc Af Amer: >60 mL/min (ref >60)
GFR calc non Af Amer: 52 mL/min — ABNORMAL LOW (ref >60)
Glucose, Bld: 199 mg/dL — ABNORMAL HIGH (ref 70–99)
Potassium: 4.1 mmol/L (ref 3.5–5.1)
Sodium: 131 mmol/L — ABNORMAL LOW (ref 135–145)

## 2019-06-06 LAB — GLUCOSE, CAPILLARY
Glucose-Capillary: 201 mg/dL — ABNORMAL HIGH (ref 70–99)
Glucose-Capillary: 164 mg/dL — ABNORMAL HIGH (ref 70–99)
Glucose-Capillary: 176 mg/dL — ABNORMAL HIGH (ref 70–99)
Glucose-Capillary: 197 mg/dL — ABNORMAL HIGH (ref 70–99)

## 2019-06-06 LAB — URINALYSIS, ROUTINE W REFLEX MICROSCOPIC
Bacteria, UA: NONE SEEN
Bilirubin Urine: NEGATIVE
Glucose, UA: 50 mg/dL — AB
Ketones, ur: NEGATIVE mg/dL
Leukocytes,Ua: NEGATIVE
Nitrite: NEGATIVE
Protein, ur: 100 mg/dL — AB
Specific Gravity, Urine: 1.011 (ref 1.005–1.030)
pH: 6 (ref 5.0–8.0)

## 2019-06-06 LAB — LACTIC ACID, PLASMA
Lactic Acid, Venous: 0.9 mmol/L (ref 0.5–1.9)
Lactic Acid, Venous: 0.9 mmol/L (ref 0.5–1.9)

## 2019-06-06 MED ORDER — SODIUM CHLORIDE 0.9 % IV BOLUS: 1000.0000 mL | Freq: Once | INTRAVENOUS | Status: DC

## 2019-06-06 MED ORDER — SODIUM CHLORIDE 0.9 % IV BOLUS
500.0000 mL | Freq: Once | INTRAVENOUS | Status: AC
  Administered 2019-06-06: 500 mL via INTRAVENOUS

## 2019-06-06 NOTE — Progress Notes (Signed)
Notified [facility] about pt temp of 102.0. Tylenol 650mg  PO given. Will recheck pt temp in an hour.

## 2019-06-06 NOTE — Progress Notes (Signed)
Patient ID: Scott Greer, male   DOB: December 27, 1955, 63 y.o.   MRN: [id number] Postoperative day 1 amputation left great toe.  Patient has no complaints this morning the dressing is clean and dry, anticipate patient can discharge to home when he is stable with therapy.  Minimal weightbearing on the left heel.  Postoperative shoe to protect his foot.

## 2019-06-06 NOTE — Progress Notes (Signed)
Notified [facility] that pt temp went down to 99.5.

## 2019-06-06 NOTE — Evaluation (Signed)
Physical Therapy Evaluation Patient Details Name: Scott Greer MRN: [id number] DOB: 03-04-1956 Today's Date: 06/06/2019   History of Present Illness  Pt is a 63 y.o. M with significant PMH of DM2, HTN who presents with sepsis secondary osteomyelitis of left great toe. 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  Clinical Impression  Pt admitted with above diagnosis. Pt currently with functional limitations due to the deficits listed below (see PT Problem List). Pt ambulating 25 feet with walker at a supervision level. SpO2 90% on RA, HR 115 bpm. Education provided re: weightbearing status/precautions, tobacco cessation, use of walker for all mobility, wound care.  Pt will benefit from skilled PT to increase their independence and safety with mobility to allow discharge to the venue listed below.   Needs social work consult as pt has unclear home discharge. He had been living in a motel prior to admission but states he cannot afford to continue to pay the daily rate.      Follow Up Recommendations No PT follow up    Equipment Recommendations  Rolling walker with 5" wheels    Recommendations for Other Services       Precautions / Restrictions Precautions Precautions: None Required Braces or Orthoses: Other Brace Other Brace: left postop shoe Restrictions Weight Bearing Restrictions: Yes LLE Weight Bearing: Partial weight bearing(minimal weightbearing through heel)      Mobility  Bed Mobility Overal bed mobility: Independent                Transfers Overall transfer level: Needs assistance Equipment used: Rolling walker (2 wheeled) Transfers: Sit to/from Stand Sit to Stand: Supervision         General transfer comment: Cues for hand and foot placement. Decreased eccentric control  Ambulation/Gait Ambulation/Gait assistance: Supervision Gait Distance (Feet): 25 Feet Assistive device: Rolling walker (2 wheeled) Gait Pattern/deviations: Step-through  pattern;Decreased stride length;Decreased weight shift to left;Trunk flexed     General Gait Details: Cues for sequencing, weightbearing status  Stairs            Wheelchair Mobility    Modified Rankin (Stroke Patients Only)       Balance Overall balance assessment: No apparent balance deficits (not formally assessed)                                           Pertinent Vitals/Pain Pain Assessment: Faces Faces Pain Scale: Hurts little more Pain Location: surgical site Pain Descriptors / Indicators: Operative site guarding Pain Intervention(s): Monitored during session    Home Living Family/patient expects to be discharged to:: Unsure                 Additional Comments: Pt recently split from his wife, has been living in a motel but states it was getting too much to pay daily    Prior Function Level of Independence: Independent         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[employer], counselling        Extremity/Trunk Assessment   Upper Extremity Assessment Upper Extremity Assessment: Overall WFL for tasks assessed    Lower Extremity Assessment Lower Extremity Assessment: LLE deficits/detail LLE Deficits / Details: s/p first toe amputation, able to flex extend other toes    Cervical / Trunk Assessment Cervical / Trunk Assessment: Normal  Communication   Communication: No difficulties  Cognition Arousal/Alertness:  Awake/alert Behavior During Therapy: WFL for tasks assessed/performed Overall Cognitive Status: Within Functional Limits for tasks assessed                                        General Comments      Exercises     Assessment/Plan    PT Assessment Patient needs continued PT services  PT Problem List Decreased activity tolerance;Decreased mobility;Pain       PT Treatment Interventions DME instruction;Gait training;Functional mobility training;Stair training;Therapeutic  activities;Therapeutic exercise;Balance training;Patient/family education    PT Goals (Current goals can be found in the Care Plan section)  Acute Rehab PT Goals Patient Stated Goal: "get back to work." PT Goal Formulation: With patient Time For Goal Achievement: 06/20/19 Potential to Achieve Goals: Good    Frequency Min 3X/week   Barriers to discharge        Co-evaluation               AM-PAC PT "6 Clicks" Mobility  Outcome Measure Help needed turning from your back to your side while in a flat bed without using bedrails?: None Help needed moving from lying on your back to sitting on the side of a flat bed without using bedrails?: None Help needed moving to and from a bed to a chair (including a wheelchair)?: None Help needed standing up from a chair using your arms (e.g., wheelchair or bedside chair)?: None Help needed to walk in hospital room?: None Help needed climbing 3-5 steps with a railing? : A Little 6 Click Score: 23    End of Session Equipment Utilized During Treatment: Other (comment)(postop shoe) Activity Tolerance: Patient tolerated treatment well Patient left: in bed;with call bell/phone within reach Nurse Communication: Mobility status PT Visit Diagnosis: Pain;Difficulty in walking, not elsewhere classified (R26.2) Pain - Right/Left: Left Pain - part of body: Ankle and joints of foot    Time: 1551-1615 PT Time Calculation (min) (ACUTE ONLY): 24 min   Charges:   PT Evaluation $PT Eval Moderate Complexity: 1 Mod PT Treatments $Gait Training: 8-22 mins        Ellamae Sia, PT, DPT Acute Rehabilitation Services Pager [phone] Office [phone]   Willy Eddy 06/06/2019, 5:23 PM

## 2019-06-06 NOTE — Progress Notes (Signed)
PROGRESS NOTE    Scott Greer  OKH:[id number] DOB: [birth year] DOA: 06/03/2019 PCP: Patient, No Pcp Per   Brief Narrative:  Scott Greer is a 63 y.o. male with medical history significant of DM2, HTN who presents with a foot wound and leg swelling.  Scott Greer reports that about 1 month ago, he developed a blister on the lateral aspect of his left great toe due to rubbing from his work boots.  He made a slice into the blister and then peeled the skin off.  Since that time it has slowly become more infected.  He presented to the ED on 7/9 and was advised to be admitted for IV antibiotics, but he preferred to go home with PO antibiotics.  He did pick up that prescription and completed the antibiotics; however, the wound has gotten worse.  Over the last week he has had increased pain in the left leg and foot, increased swelling of the left ankle and lower leg and warmth of that leg.  He has had no fever, chills, chest pain, cough, sputum production.  He has not had any medications for DM or HTN in the last 3 months.  He was going to the [facility] , but due to COVID, he was not able to go in for appointments.  He also does not have insurance or ways to regularly afford his medications, as he does not get a regular paycheck.  He has not had surgery before and would prefer not to have any surgery for this issue. In the ED, he had an xray which showed likely osteomyelitis.   Subjective:  Postop day #1 Status post left great toe amputation on 06/05/2019 Overnight patient was febrile with T-max of 102 this morning 98.3,  cultures were obtained, antibiotics were continued  Patient stable at this time, denies of have any fever chills nausea vomiting.      Assessment & Plan:   Principal Problem:   Osteomyelitis (HCC) Active Problems:   Diabetes mellitus (HCC)   Hypertension  Sepsis 2/2 Osteomyelitis of L great toe, POA, improving Diabetic foot wound -Postop day #1 -06/05/2019 status  post left great toe amputation  -T-max 102.0, currently 98.3 -Blood cultures obtained, wound cultures have been obtained >> will follow accordingly -We will continue current antibiotics: Rocephin/Flagyl per IDSA recommendation, unknown MRSA colonization  - Leukocytosis, tachycardic and L great toe infection meet criteria for sepsis - Ortho following  - wound care ongoing  - Tylenol and oxycodone for pain - Consult to case management, social work for assistance with medications   Diabetes mellitus without complication  -Stable continue diabetic diet - Continue SSI, hypoglycemic protocol - A1C 10.3 - Carb modified diet  Hypertension - Continue lisinopril at 10mg  daily  DVT prophylaxis: Heparin SQ in case of surgery  Code Status: Full  Disposition Plan: Admit for IV Abx  Rocephin/Flagyl 09/04/2019 >>  Consults called: Will need orthopedics vs. Vascular in the AM  Admission status: INP, med surg  Consultants:   Orthopedics: Scott Greer  Procedures:   L toe amputation (06/05/2019)  Antimicrobials:  Flagyl, Rocephin - 7/30 - ongoing -------------------------------------------------------------------------------------------------------------------------------------------------------   Objective:  BP (!) 163/95   Pulse 80   Temp 98.3 F (36.8 C) (Oral)   Resp 19   Ht 6\' 3"  (1.905 m)   Wt 102.1 kg   SpO2 97%   BMI 28.12 kg/m    Physical Exam  Constitution:  Alert, cooperative, no distress,  Psychiatric: Normal and stable mood and affect,  cognition intact,   HEENT: Normocephalic, PERRL, otherwise with in Normal limits  Chest:Chest symmetric Cardio vascular:  S1/S2, RRR, No murmure, No Rubs or Gallops  pulmonary: Clear to auscultation bilaterally, respirations unlabored, negative wheezes / crackles Abdomen: Soft, non-tender, non-distended, bowel sounds,no masses, no organomegaly Muscular skeletal: Limited exam - in bed, able to move all 4 extremities, Normal strength,   Neuro: CNII-XII intact. , normal motor and sensation, reflexes intact  Extremities: No pitting edema lower extremities, +2 strong pulses bilateral posterior. Skin: Dry, warm to touch, negative for any Rashes, surgical wound, amputated left great toe Wounds: per nursing documentation, surgical wound   Data Reviewed: I have personally reviewed following labs and imaging studies  CBC: Recent Labs  Lab 06/03/19 2158 06/05/19 0737 06/06/19 0424  WBC 8.0 11.6* 8.2  NEUTROABS 5.8  --   --   HGB 12.7* 11.3* 10.6*  HCT 39.0 33.7* 31.6*  MCV 88.2 87.5 86.3  PLT 379 277 509   Basic Metabolic Panel: Recent Labs  Lab 06/03/19 2158 06/05/19 0737 06/06/19 0424  NA 136 133* 131*  K 3.8 4.2 4.1  CL 98 100 98  CO2 28 24 23   GLUCOSE 196* 156* 199*  BUN 14 12 15   CREATININE 1.22 1.36* 1.45*  CALCIUM 9.6 8.9 8.3*   GFR: Estimated Creatinine Clearance: 70.1 mL/min (A) (by C-G formula based on SCr of 1.45 mg/dL (H)). Liver Function Tests: Recent Labs  Lab 06/03/19 2158  AST 15  ALT 15  ALKPHOS 65  BILITOT 0.4  PROT 7.7  ALBUMIN 3.4*   HbA1C: Recent Labs    06/04/19 0824  HGBA1C 10.3*   CBG: Recent Labs  Lab 06/05/19 1222 06/05/19 1726 06/05/19 2102 06/06/19 0758 06/06/19 1204  GLUCAP 128* 146* 291* 201* 164*    Recent Labs  Lab 06/04/19 0515 06/06/19 0914 06/06/19 1222  LATICACIDVEN 0.9 0.9 0.9      Radiology Studies: Dg Chest Bilateral Decubitus  Result Date: 06/06/2019 CLINICAL DATA:  Postop fever 102 EXAM: CHEST - BILATERAL DECUBITUS VIEW COMPARISON:  None. FINDINGS: LEFT and RIGHT lateral decubitus views demonstrates no pleural fluid. No atelectasis. No pneumothorax. IMPRESSION: No abnormality identified on lateral decubitus views. Electronically Signed   By: Suzy Bouchard M.D.   On: 06/06/2019 10:15   Scheduled Meds: . docusate sodium  100 mg Oral BID  . feeding supplement (PRO-STAT SUGAR FREE 64)  30 mL Oral BID  . heparin  5,000 Units Subcutaneous  Q8H  . insulin aspart  0-5 Units Subcutaneous QHS  . insulin aspart  0-9 Units Subcutaneous TID WC  . lisinopril  10 mg Oral Daily  . metroNIDAZOLE  500 mg Oral Q8H  . nutrition supplement (JUVEN)  1 packet Oral BID BM  . sodium chloride flush  10-40 mL Intracatheter Q12H   Continuous Infusions: . sodium chloride 125 mL/hr at 06/06/19 1249  . sodium chloride    . cefTRIAXone (ROCEPHIN)  IV 2 g (06/06/19 0842)  . lactated ringers    . methocarbamol (ROBAXIN) IV      LOS: 2 days   Time spent: 34min   Deatra James, MD [facility]  If 7PM-7AM, please contact night-coverage [email or web address] Password [email or web address] 06/06/2019, 1:59 PM

## 2019-06-07 ENCOUNTER — Encounter (HOSPITAL_COMMUNITY): Payer: Self-pay | Admitting: *Deleted

## 2019-06-07 LAB — BASIC METABOLIC PANEL
Anion gap: 12 (ref 5–15)
BUN: 13 mg/dL (ref 6–20)
CO2: 22 mmol/L (ref 22–32)
Calcium: 8.6 mg/dL — ABNORMAL LOW (ref 8.9–10.3)
Chloride: 98 mmol/L (ref 98–111)
Creatinine, Ser: 1.24 mg/dL (ref 0.61–1.24)
GFR calc Af Amer: >60 mL/min (ref >60)
GFR calc non Af Amer: >60 mL/min (ref >60)
Glucose, Bld: 147 mg/dL — ABNORMAL HIGH (ref 70–99)
Potassium: 3.7 mmol/L (ref 3.5–5.1)
Sodium: 132 mmol/L — ABNORMAL LOW (ref 135–145)

## 2019-06-07 LAB — CBC
HCT: 32.9 % — ABNORMAL LOW (ref 39.0–52.0)
Hemoglobin: 11.1 g/dL — ABNORMAL LOW (ref 13.0–17.0)
MCH: 28.8 pg (ref 26.0–34.0)
MCHC: 33.7 g/dL (ref 30.0–36.0)
MCV: 85.5 fL (ref 80.0–100.0)
Platelets: 261 10*3/uL (ref 150–400)
RBC: 3.85 MIL/uL — ABNORMAL LOW (ref 4.22–5.81)
RDW: 12.8 % (ref 11.5–15.5)
WBC: 8.7 10*3/uL (ref 4.0–10.5)
nRBC: 0 % (ref 0.0–0.2)

## 2019-06-07 LAB — GLUCOSE, CAPILLARY
Glucose-Capillary: 209 mg/dL — ABNORMAL HIGH (ref 70–99)
Glucose-Capillary: 223 mg/dL — ABNORMAL HIGH (ref 70–99)
Glucose-Capillary: 174 mg/dL — ABNORMAL HIGH (ref 70–99)
Glucose-Capillary: 247 mg/dL — ABNORMAL HIGH (ref 70–99)

## 2019-06-07 MED ORDER — LISINOPRIL 20 MG PO TABS
20.0000 mg | ORAL_TABLET | Freq: Every day | ORAL | Status: DC
  Administered 2019-06-08: 20 mg via ORAL
  Filled 2019-06-07: qty 1

## 2019-06-07 NOTE — Progress Notes (Signed)
PROGRESS NOTE    Cline CrockCraig L Vandemark  WUJ:[id number] DOB: 12-Feb-1956 DOA: 06/03/2019 PCP: Patient, No Pcp Per   Brief Narrative:  Cline CrockCraig L Demarest is a [age] y.o. male with medical history significant of DM2, HTN who presents with a foot wound and leg swelling.  [name] reports that about 1 month ago, he developed a blister on the lateral aspect of his left great toe due to rubbing from his work boots.  He made a slice into the blister and then peeled the skin off.  Since that time it has slowly become more infected.  He presented to the ED on 7/9 and was advised to be admitted for IV antibiotics, but he preferred to go home with PO antibiotics.  He did pick up that prescription and completed the antibiotics; however, the wound has gotten worse.  Over the last week he has had increased pain in the left leg and foot, increased swelling of the left ankle and lower leg and warmth of that leg.  He has had no fever, chills, chest pain, cough, sputum production.  He has not had any medications for DM or HTN in the last 3 months.  He was going to the [facility], but due to COVID, he was not able to go in for appointments.  He also does not have insurance or ways to regularly afford his medications, as he does not get a regular paycheck.  He has not had surgery before and would prefer not to have any surgery for this issue. In the ED, he had an xray which showed likely osteomyelitis.   Subjective:  Postop day #2 Status post left great toe amputation on 06/05/2019 The patient was seen and examined this morning, stable afebrile normotensive.  No complaints this a.m.  No issues overnight.  Patient was updated the culture still pending, and his antibiotics will be adjusted accordingly once we have the cultures.     Assessment & Plan:   Principal Problem:   Osteomyelitis (HCC) Active Problems:   Diabetes mellitus (HCC)   Hypertension  Sepsis 2/2 Osteomyelitis of L great toe, POA, improving  Diabetic foot wound -Postop day #2 -06/05/2019 status post left great toe amputation  -T-max 102.0,  >>> temp this morning 99.1, mildly elevated blood pressure 172/96. -Blood cultures / wound cultures have been obtained >> will follow accordingly -We will continue current antibiotics: Rocephin/Flagyl per IDSA recommendation, unknown MRSA colonization  - Leukocytosis, tachycardic and L great toe infection meet criteria for sepsis - Ortho following  - wound care ongoing  - Tylenol and oxycodone for pain - Consult to case management, social work for assistance with medications   Diabetes mellitus without complication  -Stable continue diabetic diet - Continue SSI, hypoglycemic protocol - A1C 10.3 - Carb modified diet  Hypertension - Continue lisinopril at 10mg  daily  DVT prophylaxis: Heparin SQ in case of surgery  Code Status: Full  Disposition Plan: Admit for IV Abx  Rocephin/Flagyl 09/04/2019 >>  Consults called: Will need orthopedics vs. Vascular in the AM  Admission status: INP, med surg --patient will remain as an inpatient for continued IV antibiotics post left great toe amputation for osteomyelitis. Pending cultures to tailor antibiotics accordingly.  [facility]  Consultants:   Orthopedics: Lajoyce Cornersuda  Procedures:   L toe amputation (06/05/2019)  Antimicrobials:  Flagyl, Rocephin - 7/30 - ongoing -------------------------------------------------------------------------------------------------------------------   Objective: BP (!) 172/96 (BP Location: Left Arm)   Pulse 98   Temp  99.1 F (37.3 C) (Oral)   Resp 18   Ht 6\' 3"  (1.905 m)   Wt 102.1 kg   SpO2 95%   BMI 28.12 kg/m    Physical Exam  Constitution:  Alert, cooperative, no distress,  Psychiatric: Normal and stable mood and affect, cognition intact,   HEENT: Normocephalic, PERRL, otherwise with in Normal limits  Chest:Chest symmetric Cardio vascular:  S1/S2,  RRR, No murmure, No Rubs or Gallops  pulmonary: Clear to auscultation bilaterally, respirations unlabored, negative wheezes / crackles Abdomen: Soft, non-tender, non-distended, bowel sounds,no masses, no organomegaly Muscular skeletal: Limited exam - in bed, able to move all 4 extremities, Normal strength,  Neuro: CNII-XII intact. , normal motor and sensation, reflexes intact  Extremities: No pitting edema lower extremities, +2 pulses  Skin: Dry, warm to touch, negative for any Rashes, left foot great to amputated, dressing in place, Wounds: per nursing documentation, dressing in place on surgical wound left foot     Data Reviewed: I have personally reviewed following labs and imaging studies  CBC: Recent Labs  Lab 06/03/19 2158 06/05/19 0737 06/06/19 0424 06/07/19 0507  WBC 8.0 11.6* 8.2 8.7  NEUTROABS 5.8  --   --   --   HGB 12.7* 11.3* 10.6* 11.1*  HCT 39.0 33.7* 31.6* 32.9*  MCV 88.2 87.5 86.3 85.5  PLT 379 277 244 532   Basic Metabolic Panel: Recent Labs  Lab 06/03/19 2158 06/05/19 0737 06/06/19 0424 06/07/19 0507  NA 136 133* 131* 132*  K 3.8 4.2 4.1 3.7  CL 98 100 98 98  CO2 28 24 23 22   GLUCOSE 196* 156* 199* 147*  BUN 14 12 15 13   CREATININE 1.22 1.36* 1.45* 1.24  CALCIUM 9.6 8.9 8.3* 8.6*   GFR: Estimated Creatinine Clearance: 82 mL/min (by C-G formula based on SCr of 1.24 mg/dL). Liver Function Tests: Recent Labs  Lab 06/03/19 2158  AST 15  ALT 15  ALKPHOS 65  BILITOT 0.4  PROT 7.7  ALBUMIN 3.4*   HbA1C: No results for input(s): HGBA1C in the last 72 hours. CBG: Recent Labs  Lab 06/06/19 1204 06/06/19 1624 06/06/19 2025 06/07/19 0813 06/07/19 1131  GLUCAP 164* 176* 197* 209* 223*    Recent Labs  Lab 06/04/19 0515 06/06/19 0914 06/06/19 1222  LATICACIDVEN 0.9 0.9 0.9      Radiology Studies: Dg Chest Bilateral Decubitus  Result Date: 06/06/2019 CLINICAL DATA:  Postop fever 102 EXAM: CHEST - BILATERAL DECUBITUS VIEW COMPARISON:   None. FINDINGS: LEFT and RIGHT lateral decubitus views demonstrates no pleural fluid. No atelectasis. No pneumothorax. IMPRESSION: No abnormality identified on lateral decubitus views. Electronically Signed   By: Suzy Bouchard M.D.   On: 06/06/2019 10:15   Scheduled Meds: . docusate sodium  100 mg Oral BID  . feeding supplement (PRO-STAT SUGAR FREE 64)  30 mL Oral BID  . heparin  5,000 Units Subcutaneous Q8H  . insulin aspart  0-5 Units Subcutaneous QHS  . insulin aspart  0-9 Units Subcutaneous TID WC  . lisinopril  10 mg Oral Daily  . metroNIDAZOLE  500 mg Oral Q8H  . nutrition supplement (JUVEN)  1 packet Oral BID BM  . sodium chloride flush  10-40 mL Intracatheter Q12H   Continuous Infusions: . sodium chloride 125 mL/hr at 06/06/19 1249  . sodium chloride    . cefTRIAXone (ROCEPHIN)  IV 2 g (06/07/19 1007)  . lactated ringers    . methocarbamol (ROBAXIN) IV      LOS: 3 days  Time spent:   Kendell Bane, MD [facility]  If 7PM-7AM, please contact night-coverage [email or web address] Password [email or web address] 06/07/2019, 11:55 AM

## 2019-06-07 NOTE — Progress Notes (Signed)
Received report from Shanon Brow, RN @0145 .

## 2019-06-08 ENCOUNTER — Inpatient Hospital Stay (HOSPITAL_COMMUNITY): Payer: Self-pay

## 2019-06-08 ENCOUNTER — Encounter (HOSPITAL_COMMUNITY): Payer: Self-pay | Admitting: Orthopedic Surgery

## 2019-06-08 DIAGNOSIS — Z9889 Other specified postprocedural states: Secondary | ICD-10-CM

## 2019-06-08 LAB — GLUCOSE, CAPILLARY
Glucose-Capillary: 152 mg/dL — ABNORMAL HIGH (ref 70–99)
Glucose-Capillary: 171 mg/dL — ABNORMAL HIGH (ref 70–99)

## 2019-06-08 LAB — CBC
HCT: 30.1 % — ABNORMAL LOW (ref 39.0–52.0)
Hemoglobin: 10.1 g/dL — ABNORMAL LOW (ref 13.0–17.0)
MCH: 28.9 pg (ref 26.0–34.0)
MCHC: 33.6 g/dL (ref 30.0–36.0)
MCV: 86.2 fL (ref 80.0–100.0)
Platelets: 242 10*3/uL (ref 150–400)
RBC: 3.49 MIL/uL — ABNORMAL LOW (ref 4.22–5.81)
RDW: 12.9 % (ref 11.5–15.5)
WBC: 6.1 10*3/uL (ref 4.0–10.5)
nRBC: 0 % (ref 0.0–0.2)

## 2019-06-08 LAB — BASIC METABOLIC PANEL
Anion gap: 9 (ref 5–15)
BUN: 19 mg/dL (ref 6–20)
CO2: 27 mmol/L (ref 22–32)
Calcium: 8.5 mg/dL — ABNORMAL LOW (ref 8.9–10.3)
Chloride: 97 mmol/L — ABNORMAL LOW (ref 98–111)
Creatinine, Ser: 1.13 mg/dL (ref 0.61–1.24)
GFR calc Af Amer: >60 mL/min (ref >60)
GFR calc non Af Amer: >60 mL/min (ref >60)
Glucose, Bld: 166 mg/dL — ABNORMAL HIGH (ref 70–99)
Potassium: 4 mmol/L (ref 3.5–5.1)
Sodium: 133 mmol/L — ABNORMAL LOW (ref 135–145)

## 2019-06-08 MED ORDER — CLINDAMYCIN HCL 300 MG PO CAPS: 300.0000 mg | ORAL_CAPSULE | Freq: Three times a day (TID) | ORAL | 0 refills | Status: AC

## 2019-06-08 MED ORDER — METFORMIN HCL 1000 MG PO TABS: 1000.0000 mg | ORAL_TABLET | Freq: Two times a day (BID) | ORAL | 3 refills | Status: DC

## 2019-06-08 MED ORDER — ACETAMINOPHEN 325 MG PO TABS: 650.0000 mg | ORAL_TABLET | Freq: Four times a day (QID) | ORAL | 0 refills | Status: DC | PRN

## 2019-06-08 MED ORDER — LACTINEX PO CHEW: 1.0000 | CHEWABLE_TABLET | Freq: Three times a day (TID) | ORAL | 0 refills | Status: AC

## 2019-06-08 MED ORDER — AMLODIPINE BESYLATE 10 MG PO TABS: 10.0000 mg | ORAL_TABLET | Freq: Every day | ORAL | 1 refills | Status: DC

## 2019-06-08 MED ORDER — INSULIN ASPART 100 UNIT/ML ~~LOC~~ SOLN: 0.0000 [IU] | Freq: Three times a day (TID) | SUBCUTANEOUS | 11 refills | Status: DC

## 2019-06-08 MED ORDER — LISINOPRIL 20 MG PO TABS: 20.0000 mg | ORAL_TABLET | Freq: Every day | ORAL | 1 refills | Status: DC

## 2019-06-08 MED ORDER — OXYCODONE HCL 5 MG PO TABS: 5.0000 mg | ORAL_TABLET | Freq: Four times a day (QID) | ORAL | 0 refills | Status: AC | PRN

## 2019-06-08 MED ORDER — AMLODIPINE BESYLATE 10 MG PO TABS
10.0000 mg | ORAL_TABLET | Freq: Every day | ORAL | Status: DC
  Administered 2019-06-08: 10 mg via ORAL
  Filled 2019-06-08: qty 1

## 2019-06-08 MED FILL — oxyCODONE HCL 5 MG TABS: 5 | 5 days supply | Qty: 20 | Fill #0

## 2019-06-08 MED FILL — CLINDAMYCIN HCL 300 MG CAP: 300 | 10 days supply | Qty: 30 | Fill #0

## 2019-06-08 MED FILL — LISINOPRIL 20 MG TABLET: 20 | 30 days supply | Qty: 30 | Fill #0

## 2019-06-08 MED FILL — ACETAMINOPHEN 325 MG TABS: 325 | 4 days supply | Qty: 30 | Fill #0

## 2019-06-08 MED FILL — AMLODIPINE BESYLATE 10 MG T: 10 | 30 days supply | Qty: 30 | Fill #0

## 2019-06-08 MED FILL — LACTINEX CHEWABLE TABLET: 16 days supply | Qty: 50 | Fill #0

## 2019-06-08 MED FILL — metFORMIN HCL 1000 MG TABS: 1000 | 30 days supply | Qty: 60 | Fill #0

## 2019-06-08 NOTE — Discharge Summary (Signed)
Physician Discharge Summary [facility]    Patient: Scott Greer                   Admit date: 06/03/2019   DOB: 1956-02-07             Discharge date:06/08/2019/10:22 AM ZOX:[id number]                          PCP: Patient, No Pcp Per  Disposition: Home with Home health   Recommendations for Outpatient Follow-up:    Follow up: in 1 week  Discharge Condition: Stable   Code Status:   Code Status: Full Code  Diet recommendation: Diabetic diet   Discharge Diagnoses:    Principal Problem:   Osteomyelitis (HCC) Active Problems:   Diabetes mellitus (HCC)   Hypertension   History of Present Illness/ Hospital Course Charline Bills/Brief Summary:    Brief Narrative:  Scott Greer Scott Oliveris a 62 y.o.malewith medical history significant ofDM2, HTN who presents with a foot wound and leg swelling. [name] reports that about 1 month ago, he developed a blister on the lateral aspect of his left great toe due to rubbing from his work boots. He made a slice into the blister and then peeled the skin off. Since that time it has slowly become more infected. He presented to the ED on 7/9 and was advised to be admitted for IV antibiotics, but he preferred to go home with PO antibiotics. He did pick up that prescription and completed the antibiotics; however, the wound has gotten worse. Over the last week he has had increased pain in the left leg and foot, increased swelling of the left ankle and lower leg and warmth of that leg. He has had no fever, chills, chest pain, cough, sputum production. He has not had any medications for DM or HTN in the last 3 months. He was going to the [facility], but due to COVID, he was not able to go in for appointments. He also does not have insurance or ways to regularly afford his medications, as he does not get a regular paycheck. He has not had surgery before and would prefer not to have any surgery for this issue. In the ED, he had an xray which  showed likely osteomyelitis.   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 Persistently hypertensive. No issues overnight.  Plan to discharge, including oral antibiotics, diabetic medication, diabetic diet, pressure medications were discussed with the patient in detail. Wound care per orthopedic team.   Discharge summary /  assessment & Plan:   Principal Problem:   Osteomyelitis (HCC) Active Problems:   Diabetes mellitus (HCC)   Hypertension  Sepsis 2/2 Osteomyelitis of Scott great toe, POA, improving Diabetic foot wound -Postop day #3 -06/05/2019 status post left great toe amputation  -On admission T-max 102.0,  >>> temp this morning 98.4,elevated blood pressure  -Blood cultures have been negative >>  follow accordingly -Was on  antibiotics: Rocephin/Flagyl per IDSA recommendation, unknown MRSA colonization -Switch to p.o. clindamycin for 10 more days - Leukocytosis, tachycardic and Scott great toe infection meet criteria for sepsis - Ortho team to follow as out patient   - wound care ongoing  - Tylenol and oxycodone for pain     Diabetes mellitus without complication -Stable continue diabetic diet - Continue SSI,  -Initiated metformin thousand milligrams p.o.  twice daily - A1C 10.3 - Carb modified diet  Hypertension - Continue changed lisinopril from 10 to 20 mg p.o. daily, started Norvasc at 10 mg p.o. daily   Code Status:Full Disposition Plan:Admit for IV Abx Rocephin/Flagyl 09/04/2019 >>06/08/19 Changed to PO ABx Clindamycin   Consults called: orthopedics Admission status:INP, med surg--patient will remained as an inpatient for continued IV antibiotics post left great toe amputation for osteomyelitis. Cultures - Blood cx no Growth to date   Discharge date 06/08/2019.. home with [facility]   Consultants:   Orthopedics: [place] [place]  Procedures:   Scott toe  amputation (06/05/2019)  Antimicrobials:  Flagyl, Rocephin - 7/30 - 06/08/2019 Clindamycin 300 po TID for 9 days- started 06/09/2019      Discharge Instructions:   Discharge Instructions    Activity as tolerated - No restrictions   Complete by: As directed    Call MD for:  redness, tenderness, or signs of infection (pain, swelling, redness, odor or green/yellow discharge around incision site)   Complete by: As directed    Call MD for:  temperature >100.4   Complete by: As directed    Diet - low sodium heart healthy   Complete by: As directed    Discharge instructions   Complete by: As directed    Please follow-up with your PCP in 1 week.  Please follow-up with orthopedic physician and a specific wound care instructions.  Continue current and recommended antibiotics.  You orthopedic physician will follow up with final wound cultures and may tailor antibiotics accordingly.   Elevate operative extremity   Complete by: As directed    Increase activity slowly   Complete by: As directed    Touch down weight bearing   Complete by: As directed    Laterality: left   Extremity: Lower       Medication List    TAKE these medications   acetaminophen 325 MG tablet Commonly known as: TYLENOL Take 2 tablets (650 mg total) by mouth every 6 (six) hours as needed for mild pain (or Fever >/= 101).   amLODipine 10 MG tablet Commonly known as: NORVASC Take 1 tablet (10 mg total) by mouth daily. Start taking on: June 09, 2019   clindamycin 300 MG capsule Commonly known as: CLEOCIN Take 1 capsule (300 mg total) by mouth 3 (three) times daily for 10 days.   insulin aspart 100 UNIT/ML injection Commonly known as: novoLOG Inject 0-9 Units into the skin 3 (three) times daily with meals.   lactobacillus acidophilus & bulgar chewable tablet Chew 1 tablet by mouth 3 (three) times daily with meals for 10 days.   lisinopril 20 MG tablet Commonly known as: ZESTRIL Take 1 tablet (20 mg total)  by mouth daily. Start taking on: June 09, 2019 What changed:   medication strength  how much to take   metFORMIN 1000 MG tablet Commonly known as: Glucophage Take 1 tablet (1,000 mg total) by mouth 2 (two) times daily with a meal.   omega-3 acid ethyl esters 1 g capsule Commonly known as: LOVAZA Take 2 g by mouth daily.   oxyCODONE 5 MG immediate release tablet Commonly known as: Oxy IR/ROXICODONE Take 1 tablet (5 mg total) by mouth every 6 (six) hours as needed for up to 5 days for moderate pain or severe pain.      Follow-up Information    [facility]. Go to.   Why: follow up appointment June 26, 2019 at 1:30 pm  Contact information: [address]  [place] [address] [phone]       Newt Minion, MD Follow up in 1 week(s).   Specialty: Orthopedic Surgery Contact information: [place] [address] [phone]          Allergies  Allergen Reactions   Bee Venom Hives, Itching and Swelling     Procedures /Studies:   Dg Chest Bilateral Decubitus  Result Date: 06/06/2019 CLINICAL DATA:  Postop fever 102 EXAM: CHEST - BILATERAL DECUBITUS VIEW COMPARISON:  None. FINDINGS: LEFT and RIGHT lateral decubitus views demonstrates no pleural fluid. No atelectasis. No pneumothorax. IMPRESSION: No abnormality identified on lateral decubitus views. Electronically Signed   By: Suzy Bouchard M.D.   On: 06/06/2019 10:15   Dg Foot Complete Left  Result Date: 06/03/2019 CLINICAL DATA:  Wound on great toe, left foot swelling, diabetic EXAM: LEFT FOOT - COMPLETE 3+ VIEW COMPARISON:  05/14/2019 FINDINGS: Fragmentation/destruction of the 1st distal phalanx, new from the recent prior, with overlying soft tissue wound/ulceration. This appearance is compatible with acute osteomyelitis. Mild degenerative changes of the 1st MTP joint. Mild soft tissue swelling. IMPRESSION: Destructive changes involving  the 1st distal phalanx, new from the recent prior, reflecting acute osteomyelitis. Electronically Signed   By: Julian Hy M.D.   On: 06/03/2019 22:00   Dg Foot Complete Left  Result Date: 05/14/2019 CLINICAL DATA:  Wound on first toe following removing hangnail, initial encounter EXAM: LEFT FOOT - COMPLETE 3+ VIEW COMPARISON:  None. FINDINGS: Soft tissue wound is noted consistent with the given clinical history. The underlying bony structures appear within normal limits without bony erosive change. Degenerative changes of first MTP joint and tarsal bones are noted. No soft tissue abnormality is seen. IMPRESSION: Soft tissue wound without definitive osteomyelitis. No radiopaque foreign body is noted. Electronically Signed   By: Inez Catalina M.D.   On: 05/14/2019 21:56     Subjective:   Patient was seen and examined 06/08/2019, 10:22 AM Patient stable today. No acute distress.  No issues overnight Stable for discharge.  Discharge Exam:    Vitals:   06/07/19 1940 06/08/19 0400 06/08/19 0831 06/08/19 0916  BP: (!) 166/117 (!) 161/102 (!) 153/88 127/67  Pulse: 87 81  72  Resp: 18 18  18   Temp: 99.3 F (37.4 C) 98.6 F (37 C)  98.4 F (36.9 C)  TempSrc: Oral Oral  Oral  SpO2: 95% 97%  95%  Weight:      Height:        General: Pt lying comfortably in bed & appears in no obvious distress. Cardiovascular: S1 & S2 heard, RRR, S1/S2 +. No murmurs, rubs, gallops or clicks. No JVD or pedal edema. Respiratory: Clear to auscultation without wheezing, rhonchi or crackles. No increased work of breathing. Abdominal:  Non-distended, non-tender & soft. No organomegaly or masses appreciated. Normal bowel sounds heard. CNS: Alert and oriented. No focal deficits. Extremities: no edema, no cyanosis    The results of significant diagnostics from this hospitalization (including imaging, microbiology, ancillary and laboratory) are listed below for reference.      Microbiology:   Recent  Results (from the past 240 hour(s))  Blood Cultures x 2 sites     Status: None (Preliminary result)   Collection Time: 06/04/19  5:00 AM   Specimen: BLOOD RIGHT ARM  Result Value Ref Range Status   Specimen Description BLOOD RIGHT ARM  Final   Special Requests   Final    BOTTLES DRAWN AEROBIC AND  ANAEROBIC Blood Culture adequate volume   Culture   Final    NO GROWTH 4 DAYS Performed at [facility], [address] [place], [place] [address]    Report Status PENDING  Incomplete  Blood Cultures x 2 sites     Status: None (Preliminary result)   Collection Time: 06/04/19  5:15 AM   Specimen: BLOOD RIGHT HAND  Result Value Ref Range Status   Specimen Description BLOOD RIGHT HAND  Final   Special Requests   Final    BOTTLES DRAWN AEROBIC ONLY Blood Culture adequate volume   Culture   Final    NO GROWTH 4 DAYS Performed at [facility], [address] [place], [place] [address]    Report Status PENDING  Incomplete  SARS Coronavirus 2 (CEPHEID - Performed in [facility] hospital lab), Hosp Order     Status: None   Collection Time: 06/04/19  5:46 AM   Specimen: Nasopharyngeal Swab  Result Value Ref Range Status   SARS Coronavirus 2 NEGATIVE NEGATIVE Final    Comment: (NOTE) If result is NEGATIVE SARS-CoV-2 target nucleic acids are NOT DETECTED. The SARS-CoV-2 RNA is generally detectable in upper and lower  respiratory specimens during the acute phase of infection. The lowest  concentration of SARS-CoV-2 viral copies this assay can detect is 250  copies / mL. A negative result does not preclude SARS-CoV-2 infection  and should not be used as the sole basis for treatment or other  patient management decisions.  A negative result may occur with  improper specimen collection / handling, submission of specimen other  than nasopharyngeal swab, presence of viral mutation(s) within the  areas targeted by this assay, and inadequate number of viral copies  (<250 copies / mL). A  negative result must be combined with clinical  observations, patient history, and epidemiological information. If result is POSITIVE SARS-CoV-2 target nucleic acids are DETECTED. The SARS-CoV-2 RNA is generally detectable in upper and lower  respiratory specimens dur ing the acute phase of infection.  Positive  results are indicative of active infection with SARS-CoV-2.  Clinical  correlation with patient history and other diagnostic information is  necessary to determine patient infection status.  Positive results do  not rule out bacterial infection or co-infection with other viruses. If result is PRESUMPTIVE POSTIVE SARS-CoV-2 nucleic acids MAY BE PRESENT.   A presumptive positive result was obtained on the submitted specimen  and confirmed on repeat testing.  While 2019 novel coronavirus  (SARS-CoV-2) nucleic acids may be present in the submitted sample  additional confirmatory testing may be necessary for epidemiological  and / or clinical management purposes  to differentiate between  SARS-CoV-2 and other Sarbecovirus currently known to infect humans.  If clinically indicated additional testing with an alternate test  methodology [phone]([id number]) is advised. The SARS-CoV-2 RNA is generally  detectable in upper and lower respiratory sp ecimens during the acute  phase of infection. The expected result is Negative. Fact Sheet for Patients:  BoilerBrush.com.cyhttps://www.fda.gov/media/136312/download Fact Sheet for Healthcare Providers: https://pope.com/https://www.fda.gov/media/136313/download This test is not yet approved or cleared by the [place] FDA and has been authorized for detection and/or diagnosis of SARS-CoV-2 by FDA under an Emergency Use Authorization (EUA).  This EUA will remain in effect (meaning this test can be used) for the duration of the COVID-19 declaration under Section 564(b)(1) of the Act, 21 U.S.C. section 360bbb-3(b)(1), unless the authorization is terminated or revoked sooner. Performed  at [facility], [address] [place], [place] [address]   Blood Cultures x 2 sites     Status: None (Preliminary result)   Collection Time: 06/05/19  7:40 AM   Specimen: BLOOD  Result Value Ref Range Status   Specimen Description BLOOD RIGHT ANTECUBITAL  Final   Special Requests   Final    BOTTLES DRAWN AEROBIC ONLY Blood Culture adequate volume   Culture   Final    NO GROWTH 3 DAYS Performed at [facility], [address], [place], [place] [address]    Report Status PENDING  Incomplete  Blood Cultures x 2 sites     Status: None (Preliminary result)   Collection Time: 06/05/19  7:44 AM   Specimen: BLOOD LEFT HAND  Result Value Ref Range Status   Specimen Description BLOOD LEFT HAND  Final   Special Requests   Final    BOTTLES DRAWN AEROBIC ONLY Blood Culture results may not be optimal due to an inadequate volume of blood received in culture bottles   Culture   Final    NO GROWTH 3 DAYS Performed at [facility], [address], [place], [place] [address]    Report Status PENDING  Incomplete     Labs:   CBC: Recent Labs  Lab 06/03/19 2158 06/05/19 0737 06/06/19 0424 06/07/19 0507 06/08/19 0522  WBC 8.0 11.6* 8.2 8.7 6.1  NEUTROABS 5.8  --   --   --   --   HGB 12.7* 11.3* 10.6* 11.1* 10.1*  HCT 39.0 33.7* 31.6* 32.9* 30.1*  MCV 88.2 87.5 86.3 85.5 86.2  PLT 379 277 244 261 242   Basic Metabolic Panel: Recent Labs  Lab 06/03/19 2158 06/05/19 0737 06/06/19 0424 06/07/19 0507 06/08/19 0522  NA 136 133* 131* 132* 133*  K 3.8 4.2 4.1 3.7 4.0  CL 98 100 98 98 97*  CO2 GLUCOSE 196* 156* 199* 147* 166*  BUN CREATININE 1.22 1.36* 1.45* 1.24 1.13  CALCIUM 9.6 8.9 8.3* 8.6* 8.5*   Liver Function Tests: Recent Labs  Lab 06/03/19 2158  AST 15  ALT 15  ALKPHOS 65  BILITOT 0.4  PROT 7.7  ALBUMIN 3.4*   BNP (last 3 results) No results for input(s): BNP in the last 8760 hours. Cardiac Enzymes: No results  for input(s): CKTOTAL, CKMB, CKMBINDEX, TROPONINI in the last 168 hours. CBG: Recent Labs  Lab 06/07/19 0813 06/07/19 1131 06/07/19 1705 06/07/19 2056 06/08/19 0733  GLUCAP 209* 223* 174* 247* 152*   Hgb Urinalysis    Component Value Date/Time   COLORURINE YELLOW 06/06/2019 1251   APPEARANCEUR CLEAR 06/06/2019 1251   LABSPEC 1.011 06/06/2019 1251   PHURINE 6.0 06/06/2019 1251   GLUCOSEU 50 (A) 06/06/2019 1251   HGBUR SMALL (A) 06/06/2019 1251   BILIRUBINUR NEGATIVE 06/06/2019 1251   KETONESUR NEGATIVE 06/06/2019 1251   PROTEINUR 100 (A) 06/06/2019 1251   NITRITE NEGATIVE 06/06/2019 1251   LEUKOCYTESUR NEGATIVE 06/06/2019 1251    Time coordinating discharge: Over 45 minutes  SIGNED: Kendell Bane, MD, FACP, FHM. [facility],  Pager [phone]  If 7PM-7AM, please contact night-coverage [email or web address], Password [facility] 06/08/2019, 10:22 AM

## 2019-06-08 NOTE — Progress Notes (Addendum)
Spoke with patient on the phone about his diabetes. States that he was diagnosed about 20 years ago. Has been on Metformin in the past. Was incarcerated for 20 years, but has been out for 10 months and is unemployed.   Asked patient if he would be able to afford insulin or be willing to take insulin 4 times per day. Patient states that he really would rather not take insulin and would have trouble being able to buy insulin and supplies. States that he does have a glucose meter, strips, and lancets at home. Will be following up at [facility] on August 21 per case manager note.   Recommend patient being discharged on Metformin and perhaps Amaryl until he can get to the clinic for followup. Patient states that he has been on Amaryl before, as well as the Metformin. Encouraged him to check his blood sugars X2 per day and record. Take the recordings to the clinic for his appointment.   ,Harvel Ricks RN BSN CDE Diabetes Coordinator Pager: [phone]  8am-5pm

## 2019-06-08 NOTE — TOC Transition Note (Addendum)
Transition of Care [facility]: [facility] Rhonda W) - CM/SW Discharge Note Marvetta Gibbons RN,BSN Transitions of Care Unit 5N - RN Case Manager [phone]   Patient Details  Name: Scott Greer MRN: [id number] Date of Birth: 06/19/1956  Transition of Care [facility]) CM/SW Contact:  Dawayne Patricia, RN Phone Number: 06/08/2019, 12:58 PM   Clinical Narrative:    Pt stable for transition home, referral for [facility] and DME needs- CM spoke with pt at bedside- pt reports that he has been staying at [facility] [address] ([place]) pt plans to return there to stay stating that he does not really have anywhere else to go. Pt reports he has a glucometer.  Info provided to pt on DSS so pt can f/u on resources. FC has spoken with pt. Orders placed for HHRN/SW- pt does not have insurance- will refer out to charity agency [facility])- pt agreeable to this. CM spoke with Dorian Pod with [facility] for [facility]- she will contact pt to see if he meets eligibility for services. Also discussed DME- recommendation for crutches- MD has placed order- call made to ortho tech- who will deliver crutches to bedside prior to discharge. Pt will also be assisted with medications via [place] with override for copays- TOC pharmacy to fill and deliver to bedside prior to discharge. Pt has f/u appointment with [facility]- Aug 21 at 1:30 with Cammie Fulp (per pt he used to go to [employer] [address] [facility] [facility] and had orange card prior to Covid and then was unable to afford visits)   Final next level of care: [place] Barriers to Discharge: No Barriers Identified   Patient Goals and CMS Choice Patient states their goals for this hospitalization and ongoing recovery are:: to go home CMS Medicare.gov Compare Post Acute Care list provided to:: Patient Choice offered to / list presented to : Patient  Discharge Placement          Home with [facility]             Discharge Plan and Services In-house Referral: Financial  Counselor Discharge Planning Services: Medication Assistance, [place] Program Post Acute Care Choice: Home Health, Durable Medical Equipment          DME Arranged: Crutches DME Agency: [facility] Date DME Agency Contacted: 06/08/19 Time DME Agency Contacted: 23 Representative spoke with at DME Agency: [place]: RN, Social Work [place]: Well [place] Date [place]: 06/08/19 Time [place]: 1257 Representative spoke with at [place]: [place] ([place]) Interventions     Readmission Risk Interventions Readmission Risk Prevention Plan 06/08/2019  Transportation Screening Complete  PCP or Specialist Appt within 5-7 Days Complete  Home Care Screening Complete  Medication Review (RN CM) Complete  Some recent data might be hidden

## 2019-06-08 NOTE — Progress Notes (Signed)
Orthopedic Tech Progress Note Patient Details:  Scott Greer September 02, 1956 [id number]  Ortho Devices Type of Ortho Device: Crutches Ortho Device/Splint Location: left Ortho Device/Splint Interventions: Adjustment   Post Interventions Patient Tolerated: Well Instructions Provided: Care of device   [name] 06/08/2019, 4:20 PM

## 2019-06-08 NOTE — Progress Notes (Signed)
ABI has been completed.   Preliminary results in CV Proc.   Abram Sander 06/08/2019 10:53 AM

## 2019-06-08 NOTE — Progress Notes (Signed)
Physical Therapy Treatment Patient Details Name: Scott Greer MRN: [id number] DOB: [birth year] Today's Date: 06/08/2019    History of Present Illness Pt is a 63 y.o. M with significant PMH of DM2, HTN who presents with sepsis secondary osteomyelitis of left great toe. 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    PT Comments    Patient received in bed, agrees to try using crutches. Patient educated on proper fit of crutches and how to use. Patient demonstrates safe mobility and ambulation of 25 feet with crutches, supervision. Cues needed for weight bearing status on L LE. Post op shoe donned for ambulation. Patient will benefit form continued acute PT to improve mobility prior to discharge.       Follow Up Recommendations  No PT follow up     Equipment Recommendations  Crutches(tall, patient is 6'3")    Recommendations for Other Services       Precautions / Restrictions Precautions Precautions: None Restrictions Weight Bearing Restrictions: Yes LLE Weight Bearing: Touchdown weight bearing Other Position/Activity Restrictions: heel weight bearing, post op shoe    Mobility  Bed Mobility Overal bed mobility: Independent                Transfers Overall transfer level: Independent Equipment used: Crutches Transfers: Sit to/from [employer] to Stand: Supervision            Ambulation/Gait Ambulation/Gait assistance: Scientist, forensic (Feet): 25 Feet Assistive device: Crutches Gait Pattern/deviations: Step-to pattern;Trunk flexed     General Gait Details: cues for weight bearing status, Sequencing, safety, use of DME   Stairs             Wheelchair Mobility    Modified Rankin (Stroke Patients Only)       Balance Overall balance assessment: Modified Independent                                          Cognition Arousal/Alertness: Awake/alert Behavior During Therapy: WFL for tasks assessed/performed Overall  Cognitive Status: Within Functional Limits for tasks assessed                                        Exercises      General Comments        Pertinent Vitals/Pain Pain Assessment: No/denies pain    Home Living                      Prior Function            PT Goals (current goals can now be found in the care plan section) Acute Rehab PT Goals Patient Stated Goal: "get back to work." PT Goal Formulation: With patient Time For Goal Achievement: 06/20/19 Potential to Achieve Goals: Good Progress towards PT goals: Progressing toward goals    Frequency    Min 3X/week      PT Plan Current plan remains appropriate    Co-evaluation              AM-PAC PT "6 Clicks" Mobility   Outcome Measure  Help needed turning from your back to your side while in a flat bed without using bedrails?: None Help needed moving from lying on your back to sitting on the side of a flat bed without using bedrails?:  None Help needed moving to and from a bed to a chair (including a wheelchair)?: None Help needed standing up from a chair using your arms (e.g., wheelchair or bedside chair)?: None Help needed to walk in hospital room?: None Help needed climbing 3-5 steps with a railing? : None 6 Click Score: 24    End of Session Equipment Utilized During Treatment: Gait belt Activity Tolerance: Patient tolerated treatment well Patient left: in bed;with call bell/phone within reach Nurse Communication: Mobility status PT Visit Diagnosis: Difficulty in walking, not elsewhere classified (R26.2)     Time: 1005-1025 PT Time Calculation (min) (ACUTE ONLY): 20 min  Charges:  $Gait Training: 8-22 mins                     Randi College, PT, GCS 06/08/19,11:52 AM

## 2019-06-08 NOTE — Plan of Care (Signed)

## 2019-06-08 NOTE — Progress Notes (Signed)
Patient discharging home. Discharge instructions explained to patient including f/u appointments, and he verbalized understanding. Dressing to Lt lower leg CDI. Took all personal belongings. No further questions or concerns voiced.

## 2019-06-09 ENCOUNTER — Encounter: Payer: Self-pay | Admitting: Family

## 2019-06-09 ENCOUNTER — Ambulatory Visit (INDEPENDENT_AMBULATORY_CARE_PROVIDER_SITE_OTHER): Payer: Self-pay | Admitting: Family

## 2019-06-09 DIAGNOSIS — S98112A Complete traumatic amputation of left great toe, initial encounter: Secondary | ICD-10-CM

## 2019-06-09 DIAGNOSIS — Z89412 Acquired absence of left great toe: Secondary | ICD-10-CM

## 2019-06-09 LAB — CULTURE, BLOOD (ROUTINE X 2)
Culture: NO GROWTH
Culture: NO GROWTH
Special Requests: ADEQUATE
Special Requests: ADEQUATE

## 2019-06-09 NOTE — Progress Notes (Signed)
   Post-Op Visit Note   Patient: Scott Greer           Date of Birth: [birth year]           MRN: [id number] Visit Date: 06/09/2019 PCP: Patient, No Pcp Per  Chief Complaint: No chief complaint on file.   HPI:  HPI The patient is a 63 year old gentleman seen today 5 days status post great toe amputation on the left.  He is weightbearing with 1 crutch in a postop shoe.  Complains of surgical dressing was tight.  Ortho Exam On examination incision is well approximated sutures there is slight maceration to the lateral aspect of the incision there is no drainage no erythema no sign of infection  Visit Diagnoses: No diagnosis found.  Plan: Begin daily Dial soap cleansing dry dressing changes.  Elevate for swelling.  He will continue minimizing his weightbearing and follow-up in 1 more week.  Complete your Cleocin course.  Follow-Up Instructions: Return in about 1 week (around 06/16/2019).   Imaging: No results found.  Orders:  No orders of the defined types were placed in this encounter.  No orders of the defined types were placed in this encounter.    PMFS History: Patient Active Problem List   Diagnosis Date Noted  . Osteomyelitis ([place]) 06/04/2019  . Diabetic foot infection ([place])   . Noncompliance with medication regimen   . Glaucoma   . Hyperlipidemia   . Hypertension 11/06/2003  . Diabetes mellitus ([place]) 11/05/1997   Past Medical History:  Diagnosis Date  . Diabetes mellitus without complication ([place]) 0017  . Glaucoma 2015  . Hyperlipidemia   . Hypertension 2005    Family History  Problem Relation Age of Onset  . Stroke Mother   . Diabetes Mother        Toward end of life    Past Surgical History:  Procedure Laterality Date  . AMPUTATION Left 06/05/2019   Procedure: LEFT GREAT TOE AMPUTATION;  Surgeon: Newt Minion, MD;  Location: [facility];  Service: Orthopedics;  Laterality: Left;  . NO PAST SURGERIES     Social History   Occupational History  . Not on  file  Tobacco Use  . Smoking status: Current Every Day Smoker    Types: Cigars  . Smokeless tobacco: Current User    Types: Chew  . Tobacco comment: 4 Black and mild daily.  Quit cigarettes in 2007  Substance and Sexual Activity  . Alcohol use: Yes    Comment: pint weekly  . Drug use: Not Currently    Types: Marijuana  . Sexual activity: Not on file

## 2019-06-10 LAB — CULTURE, BLOOD (ROUTINE X 2)
Culture: NO GROWTH
Culture: NO GROWTH
Special Requests: ADEQUATE

## 2019-06-18 ENCOUNTER — Ambulatory Visit (INDEPENDENT_AMBULATORY_CARE_PROVIDER_SITE_OTHER): Payer: Self-pay | Admitting: Orthopedic Surgery

## 2019-06-18 ENCOUNTER — Encounter: Payer: Self-pay | Admitting: Orthopedic Surgery

## 2019-06-18 VITALS — Ht 75.0 in | Wt 225.0 lb

## 2019-06-18 DIAGNOSIS — S98112A Complete traumatic amputation of left great toe, initial encounter: Secondary | ICD-10-CM

## 2019-06-18 DIAGNOSIS — Z89412 Acquired absence of left great toe: Secondary | ICD-10-CM

## 2019-06-22 ENCOUNTER — Telehealth: Payer: Self-pay | Admitting: Radiology

## 2019-06-22 NOTE — Telephone Encounter (Signed)
Patient came in to office requesting note that he has not been released to return to work. He is recovering post op and the [employer] is going to help pay his utility bill but needs doctor's note that he is not working. Spoke with Autumn and entered note for patient, recovering from surgery and not released to work. Out of work for at least four weeks. Note given to patient.

## 2019-06-25 ENCOUNTER — Encounter: Payer: Self-pay | Admitting: Orthopedic Surgery

## 2019-06-25 ENCOUNTER — Ambulatory Visit (INDEPENDENT_AMBULATORY_CARE_PROVIDER_SITE_OTHER): Payer: Self-pay | Admitting: Orthopedic Surgery

## 2019-06-25 VITALS — Ht 75.0 in | Wt 225.0 lb

## 2019-06-25 DIAGNOSIS — S98112A Complete traumatic amputation of left great toe, initial encounter: Secondary | ICD-10-CM

## 2019-06-25 DIAGNOSIS — Z89411 Acquired absence of right great toe: Secondary | ICD-10-CM

## 2019-06-26 ENCOUNTER — Inpatient Hospital Stay: Payer: Self-pay | Admitting: Family Medicine

## 2019-06-28 ENCOUNTER — Encounter: Payer: Self-pay | Admitting: Orthopedic Surgery

## 2019-06-28 NOTE — Progress Notes (Signed)
Office Visit Note   Patient: Scott Greer           Date of Birth: Sep 27, 1956           MRN: [id number] Visit Date: 06/25/2019              Requested by: No referring provider defined for this encounter. PCP: Patient, No Pcp Per  Chief Complaint  Patient presents with  . Left Foot - Routine Post Op    06/05/2019 left GT amp      HPI: Patient is a 63 year old gentleman who presents in follow-up for amputation left great toe he is on 1 crutch postoperative shoe dry dressing patient has progressive wound dehiscence and maceration secondary to weightbearing.  Assessment & Plan: Visit Diagnoses:  1. Amputation of left great toe (HCC)     Plan: Recommended minimal weightbearing on the right foot to prevent further wound dehiscence.  Discussed that with the patient's diabetic neuropathy and toe amputation he is at risk for returning to work on his feet.  Patient was given a note that he will be disabled for at least 6 months.  Follow-Up Instructions: Return in about 2 weeks (around 07/09/2019).   Ortho Exam  Patient is alert, oriented, no adenopathy, well-dressed, normal affect, normal respiratory effort. Examination there is dehiscence of the wound which is 2 x 1 cm.  The wound has 50% granulation tissue with no exposed bone or tendon.  And Iodosorb dressing was applied.  Imaging: No results found. No images are attached to the encounter.  Labs: Lab Results  Component Value Date   HGBA1C 10.3 (H) 06/04/2019   ESRSEDRATE 78 (H) 06/04/2019   CRP 4.5 (H) 06/04/2019   REPTSTATUS 06/10/2019 FINAL 06/05/2019   CULT  06/05/2019    NO GROWTH 5 DAYS Performed at [facility], [place] [address] [place], [place] [address]      Lab Results  Component Value Date   ALBUMIN 3.4 (L) 06/03/2019   ALBUMIN 3.6 05/14/2019   PREALBUMIN 13.8 (L) 06/04/2019    No results found for: MG No results found for: VD25OH  Lab Results  Component Value Date   PREALBUMIN 13.8 (L)  06/04/2019   CBC EXTENDED Latest Ref Rng & Units 06/08/2019 06/07/2019 06/06/2019  WBC 4.0 - 10.5 K/uL 6.1 8.7 8.2  RBC 4.22 - 5.81 MIL/uL 3.49(L) 3.85(L) 3.66(L)  HGB 13.0 - 17.0 g/dL 10.1(L) 11.1(L) 10.6(L)  HCT 39.0 - 52.0 % 30.1(L) 32.9(L) 31.6(L)  PLT 150 - 400 K/uL 242 261 244  NEUTROABS 1.7 - 7.7 K/uL - - -  LYMPHSABS 0.7 - 4.0 K/uL - - -     Body mass index is 28.12 kg/m.  Orders:  No orders of the defined types were placed in this encounter.  No orders of the defined types were placed in this encounter.    Procedures: No procedures performed  Clinical Data: No additional findings.  ROS:  All other systems negative, except as noted in the HPI. Review of Systems  Objective: Vital Signs: Ht 6\' 3"  (1.905 m)   Wt 225 lb (102.1 kg)   BMI 28.12 kg/m   Specialty Comments:  No specialty comments available.  PMFS History: Patient Active Problem List   Diagnosis Date Noted  . Osteomyelitis ([place]) 06/04/2019  . Diabetic foot infection ([place])   . Noncompliance with medication regimen   . Glaucoma   . Hyperlipidemia   . Hypertension 11/06/2003  . Diabetes mellitus ([place]) 11/05/1997  Past Medical History:  Diagnosis Date  . Diabetes mellitus without complication (HCC) 1999  . Glaucoma 2015  . Hyperlipidemia   . Hypertension 2005    Family History  Problem Relation Age of Onset  . Stroke Mother   . Diabetes Mother        Toward end of life    Past Surgical History:  Procedure Laterality Date  . AMPUTATION Left 06/05/2019   Procedure: LEFT GREAT TOE AMPUTATION;  Surgeon: [name], MD;  Location: [facility] OR;  Service: Orthopedics;  Laterality: Left;  . NO PAST SURGERIES     Social History   Occupational History  . Not on file  Tobacco Use  . Smoking status: Current Every Day Smoker    Types: Cigars  . Smokeless tobacco: Current User    Types: Chew  . Tobacco comment: 4 Black and mild daily.  Quit cigarettes in 2007  Substance and Sexual Activity  .  Alcohol use: Yes    Comment: pint weekly  . Drug use: Not Currently    Types: Marijuana  . Sexual activity: Not on file

## 2019-07-04 ENCOUNTER — Encounter: Payer: Self-pay | Admitting: Orthopedic Surgery

## 2019-07-04 NOTE — Progress Notes (Signed)
Office Visit Note   Patient: Scott Greer           Date of Birth: 08/18/1956           MRN: [id number] Visit Date: 06/18/2019              Requested by: No referring provider defined for this encounter. PCP: Patient, No Pcp Per  Chief Complaint  Patient presents with  . Left Foot - Routine Post Op    06/05/19 left GT amp      HPI: Patient is a 63 year old gentleman who presents 2 weeks status post left great toe amputation.  Patient has been full weightbearing using one crutch.  Patient states that he has been driving around.  Assessment & Plan: Visit Diagnoses:  1. Amputation of left great toe (HCC)     Plan: Recommended soap and water cleansing daily dry dressing change daily patient was instructed to stop using hot water.  The importance of minimizing weightbearing and elevation was discussed.  Follow-Up Instructions: Return in about 1 week (around 06/25/2019).   Ortho Exam  Patient is alert, oriented, no adenopathy, well-dressed, normal affect, normal respiratory effort. Examination patient has slight maceration small amount of clear drainage.  The skin is peeling from swelling.  Imaging: No results found. No images are attached to the encounter.  Labs: Lab Results  Component Value Date   HGBA1C 10.3 (H) 06/04/2019   ESRSEDRATE 78 (H) 06/04/2019   CRP 4.5 (H) 06/04/2019   REPTSTATUS 06/10/2019 FINAL 06/05/2019   CULT  06/05/2019    NO GROWTH 5 DAYS Performed at [facility], [place] [address] [place], [place] [address]      Lab Results  Component Value Date   ALBUMIN 3.4 (L) 06/03/2019   ALBUMIN 3.6 05/14/2019   PREALBUMIN 13.8 (L) 06/04/2019    No results found for: MG No results found for: VD25OH  Lab Results  Component Value Date   PREALBUMIN 13.8 (L) 06/04/2019   CBC EXTENDED Latest Ref Rng & Units 06/08/2019 06/07/2019 06/06/2019  WBC 4.0 - 10.5 K/uL 6.1 8.7 8.2  RBC 4.22 - 5.81 MIL/uL 3.49(L) 3.85(L) 3.66(L)  HGB 13.0 - 17.0 g/dL  10.1(L) 11.1(L) 10.6(L)  HCT 39.0 - 52.0 % 30.1(L) 32.9(L) 31.6(L)  PLT 150 - 400 K/uL 242 261 244  NEUTROABS 1.7 - 7.7 K/uL - - -  LYMPHSABS 0.7 - 4.0 K/uL - - -     Body mass index is 28.12 kg/m.  Orders:  No orders of the defined types were placed in this encounter.  No orders of the defined types were placed in this encounter.    Procedures: No procedures performed  Clinical Data: No additional findings.  ROS:  All other systems negative, except as noted in the HPI. Review of Systems  Objective: Vital Signs: Ht 6\' 3"  (1.905 m)   Wt 225 lb (102.1 kg)   BMI 28.12 kg/m   Specialty Comments:  No specialty comments available.  PMFS History: Patient Active Problem List   Diagnosis Date Noted  . Osteomyelitis ([place]) 06/04/2019  . Diabetic foot infection ([place])   . Noncompliance with medication regimen   . Glaucoma   . Hyperlipidemia   . Hypertension 11/06/2003  . Diabetes mellitus ([place]) 11/05/1997   Past Medical History:  Diagnosis Date  . Diabetes mellitus without complication ([place]) 2202  . Glaucoma 2015  . Hyperlipidemia   . Hypertension 2005    Family History  Problem Relation Age of Onset  .  Stroke Mother   . Diabetes Mother        Toward end of life    Past Surgical History:  Procedure Laterality Date  . AMPUTATION Left 06/05/2019   Procedure: LEFT GREAT TOE AMPUTATION;  Surgeon: [name], MD;  Location: [facility] OR;  Service: Orthopedics;  Laterality: Left;  . NO PAST SURGERIES     Social History   Occupational History  . Not on file  Tobacco Use  . Smoking status: Current Every Day Smoker    Types: Cigars  . Smokeless tobacco: Current User    Types: Chew  . Tobacco comment: 4 Black and mild daily.  Quit cigarettes in 2007  Substance and Sexual Activity  . Alcohol use: Yes    Comment: pint weekly  . Drug use: Not Currently    Types: Marijuana  . Sexual activity: Not on file

## 2019-07-09 ENCOUNTER — Inpatient Hospital Stay: Payer: Self-pay | Admitting: Family Medicine

## 2019-07-09 ENCOUNTER — Encounter (HOSPITAL_COMMUNITY): Payer: Self-pay | Admitting: *Deleted

## 2019-07-09 ENCOUNTER — Other Ambulatory Visit: Payer: Self-pay

## 2019-07-09 ENCOUNTER — Encounter: Payer: Self-pay | Admitting: Orthopedic Surgery

## 2019-07-09 ENCOUNTER — Ambulatory Visit (INDEPENDENT_AMBULATORY_CARE_PROVIDER_SITE_OTHER): Payer: Self-pay | Admitting: Orthopedic Surgery

## 2019-07-09 VITALS — Ht 75.0 in | Wt 225.0 lb

## 2019-07-09 DIAGNOSIS — Z89412 Acquired absence of left great toe: Secondary | ICD-10-CM

## 2019-07-09 DIAGNOSIS — S98112A Complete traumatic amputation of left great toe, initial encounter: Secondary | ICD-10-CM

## 2019-07-09 DIAGNOSIS — T8781 Dehiscence of amputation stump: Secondary | ICD-10-CM

## 2019-07-09 NOTE — Progress Notes (Signed)
Pt denies SOB, chest pain, and being under the care of a cardiologist. Pt denies having a stress test echo and cardiac cath. Pt denies recent labs. Pt made aware to stop taking vitamins, fish oil (Lovaza) and herbal medications. Do not take any NSAIDs ie: Ibuprofen, Advil, Naproxen (Aleve), Motrin, BC and Goody Powder. Pt made aware to not take Metformin (Glucophage) the DOS. Pt made aware to check CBG every 2 hours prior to arrival to hospital on DOS. Pt made aware to treat a CBG < 70 with 4 ounces of apple or cranberry juice, wait 15 minutes after intervention to recheck BG, if BG remains < 70, call Short Stay unit to speak with a nurse. Pt verbalized understanding of all pre-op instructions.

## 2019-07-09 NOTE — Progress Notes (Signed)
Office Visit Note   Patient: Scott Greer           Date of Birth: [birth year]           MRN: [id number] Visit Date: 07/09/2019              Requested by: No referring provider defined for this encounter. PCP: Patient, No Pcp Per  Chief Complaint  Patient presents with  . Left Foot - Routine Post Op    06/05/19 left great toe amputation       HPI: Patient is a 63 year old gentleman who presents 4 weeks status post left great toe amputation he has been doing dressing changes is partial weightbearing with crutches and a postoperative shoe.  Past medical history positive for diabetes and hypertension.  Assessment & Plan: Visit Diagnoses:  1. Amputation of left great toe (HCC)   2. Dehiscence of amputation stump (HCC)     Plan: With wound dehiscence patient will need to proceed with a first ray amputation.  Discussed that with patient's significant microcirculatory problems he is at increased risk once this incision heals of further wound breakdown with weightbearing with standing on his feet for work.  Patient is given a note that states that he would be permanently disabled from working on his feet once we complete the first ray amputation.  Follow-Up Instructions: Return in about 1 week (around 07/16/2019).   Ortho Exam  Patient is alert, oriented, no adenopathy, well-dressed, normal affect, normal respiratory effort. Examination patient has a palpable dorsalis pedis and posterior tibial pulse he has good macro circulation to the ankle.  Patient does have over 30-year history of tobacco use and is still smoking cigars.  Wound shows complete dehiscence of the residual stump with exposed metatarsal head.  Hemoglobin A1c 10.3 with poor diabetic control.  Imaging: No results found. No images are attached to the encounter.  Labs: Lab Results  Component Value Date   HGBA1C 10.3 (H) 06/04/2019   ESRSEDRATE 78 (H) 06/04/2019   CRP 4.5 (H) 06/04/2019   REPTSTATUS 06/10/2019  FINAL 06/05/2019   CULT  06/05/2019    NO GROWTH 5 DAYS Performed at [facility], [address] [place], [place] [address]      Lab Results  Component Value Date   ALBUMIN 3.4 (L) 06/03/2019   ALBUMIN 3.6 05/14/2019   PREALBUMIN 13.8 (L) 06/04/2019    No results found for: MG No results found for: VD25OH  Lab Results  Component Value Date   PREALBUMIN 13.8 (L) 06/04/2019   CBC EXTENDED Latest Ref Rng & Units 06/08/2019 06/07/2019 06/06/2019  WBC 4.0 - 10.5 K/uL 6.1 8.7 8.2  RBC 4.22 - 5.81 MIL/uL 3.49(L) 3.85(L) 3.66(L)  HGB 13.0 - 17.0 g/dL 10.1(L) 11.1(L) 10.6(L)  HCT 39.0 - 52.0 % 30.1(L) 32.9(L) 31.6(L)  PLT 150 - 400 K/uL 242 261 244  NEUTROABS 1.7 - 7.7 K/uL - - -  LYMPHSABS 0.7 - 4.0 K/uL - - -     Body mass index is 28.12 kg/m.  Orders:  No orders of the defined types were placed in this encounter.  No orders of the defined types were placed in this encounter.    Procedures: No procedures performed  Clinical Data: No additional findings.  ROS:  All other systems negative, except as noted in the HPI. Review of Systems  Objective: Vital Signs: Ht 6\' 3"  (1.905 m)   Wt 225 lb (102.1 kg)   BMI 28.12 kg/m  Specialty Comments:  No specialty comments available.  PMFS History: Patient Active Problem List   Diagnosis Date Noted  . Osteomyelitis ([place]) 06/04/2019  . Diabetic foot infection ([place])   . Noncompliance with medication regimen   . Glaucoma   . Hyperlipidemia   . Hypertension 11/06/2003  . Diabetes mellitus ([place]) 11/05/1997   Past Medical History:  Diagnosis Date  . Diabetes mellitus without complication ([place]) 5498  . Glaucoma 2015  . Hyperlipidemia   . Hypertension 2005    Family History  Problem Relation Age of Onset  . Stroke Mother   . Diabetes Mother        Toward end of life    Past Surgical History:  Procedure Laterality Date  . AMPUTATION Left 06/05/2019   Procedure: LEFT GREAT TOE AMPUTATION;  Surgeon: Newt Minion, MD;  Location: [facility];  Service: Orthopedics;  Laterality: Left;  . NO PAST SURGERIES     Social History   Occupational History  . Not on file  Tobacco Use  . Smoking status: Current Every Day Smoker    Types: Cigars  . Smokeless tobacco: Current User    Types: Chew  . Tobacco comment: 4 Black and mild daily.  Quit cigarettes in 2007  Substance and Sexual Activity  . Alcohol use: Yes    Comment: pint weekly  . Drug use: Not Currently    Types: Marijuana  . Sexual activity: Not on file

## 2019-07-10 ENCOUNTER — Ambulatory Visit (HOSPITAL_COMMUNITY): Payer: Self-pay | Admitting: Registered Nurse

## 2019-07-10 ENCOUNTER — Encounter (HOSPITAL_COMMUNITY): Payer: Self-pay | Admitting: Registered Nurse

## 2019-07-10 ENCOUNTER — Ambulatory Visit (HOSPITAL_COMMUNITY)
Admission: RE | Admit: 2019-07-10 | Discharge: 2019-07-10 | Disposition: A | Discharge status: Home / Self Care | Payer: Self-pay | Attending: Orthopedic Surgery | Admitting: Orthopedic Surgery

## 2019-07-10 ENCOUNTER — Encounter (HOSPITAL_COMMUNITY)
Admission: RE | Disposition: A | Discharge status: Home / Self Care | Payer: Self-pay | Source: Home / Self Care | Attending: Orthopedic Surgery

## 2019-07-10 DIAGNOSIS — Z823 Family history of stroke: Secondary | ICD-10-CM | POA: Insufficient documentation

## 2019-07-10 DIAGNOSIS — I10 Essential (primary) hypertension: Secondary | ICD-10-CM | POA: Insufficient documentation

## 2019-07-10 DIAGNOSIS — E785 Hyperlipidemia, unspecified: Secondary | ICD-10-CM | POA: Insufficient documentation

## 2019-07-10 DIAGNOSIS — E11628 Type 2 diabetes mellitus with other skin complications: Secondary | ICD-10-CM

## 2019-07-10 DIAGNOSIS — E119 Type 2 diabetes mellitus without complications: Secondary | ICD-10-CM | POA: Insufficient documentation

## 2019-07-10 DIAGNOSIS — T8781 Dehiscence of amputation stump: Secondary | ICD-10-CM | POA: Insufficient documentation

## 2019-07-10 DIAGNOSIS — F17290 Nicotine dependence, other tobacco product, uncomplicated: Secondary | ICD-10-CM | POA: Insufficient documentation

## 2019-07-10 DIAGNOSIS — X58XXXA Exposure to other specified factors, initial encounter: Secondary | ICD-10-CM | POA: Insufficient documentation

## 2019-07-10 DIAGNOSIS — L089 Local infection of the skin and subcutaneous tissue, unspecified: Secondary | ICD-10-CM

## 2019-07-10 DIAGNOSIS — Z833 Family history of diabetes mellitus: Secondary | ICD-10-CM | POA: Insufficient documentation

## 2019-07-10 DIAGNOSIS — Z91030 Bee allergy status: Secondary | ICD-10-CM | POA: Insufficient documentation

## 2019-07-10 HISTORY — PX: AMPUTATION: SHX166

## 2019-07-10 HISTORY — DX: Presence of spectacles and contact lenses: Z97.3

## 2019-07-10 HISTORY — DX: Dehiscence of amputation stump: T87.81

## 2019-07-10 LAB — GLUCOSE, CAPILLARY
Glucose-Capillary: 92 mg/dL (ref 70–99)
Glucose-Capillary: 73 mg/dL (ref 70–99)
Glucose-Capillary: 79 mg/dL (ref 70–99)

## 2019-07-10 LAB — CBC
HCT: 34.7 % — ABNORMAL LOW (ref 39.0–52.0)
Hemoglobin: 11.2 g/dL — ABNORMAL LOW (ref 13.0–17.0)
MCH: 28.7 pg (ref 26.0–34.0)
MCHC: 32.3 g/dL (ref 30.0–36.0)
MCV: 89 fL (ref 80.0–100.0)
Platelets: 273 10*3/uL (ref 150–400)
RBC: 3.9 MIL/uL — ABNORMAL LOW (ref 4.22–5.81)
RDW: 15.4 % (ref 11.5–15.5)
WBC: 6.8 10*3/uL (ref 4.0–10.5)
nRBC: 0 % (ref 0.0–0.2)

## 2019-07-10 LAB — BASIC METABOLIC PANEL
Anion gap: 10 (ref 5–15)
BUN: 13 mg/dL (ref 6–20)
CO2: 23 mmol/L (ref 22–32)
Calcium: 9.2 mg/dL (ref 8.9–10.3)
Chloride: 104 mmol/L (ref 98–111)
Creatinine, Ser: 1.28 mg/dL — ABNORMAL HIGH (ref 0.61–1.24)
GFR calc Af Amer: >60 mL/min (ref >60)
GFR calc non Af Amer: >60 mL/min (ref >60)
Glucose, Bld: 88 mg/dL (ref 70–99)
Potassium: 4.4 mmol/L (ref 3.5–5.1)
Sodium: 137 mmol/L (ref 135–145)

## 2019-07-10 LAB — SARS CORONAVIRUS 2 BY RT PCR (HOSPITAL ORDER, PERFORMED IN ~~LOC~~ HOSPITAL LAB): SARS Coronavirus 2: NEGATIVE

## 2019-07-10 SURGERY — AMPUTATION, FOOT, RAY
Anesthesia: General | Laterality: Left

## 2019-07-10 MED ORDER — MIDAZOLAM HCL 5 MG/5ML IJ SOLN
INTRAMUSCULAR | Status: DC | PRN
  Administered 2019-07-10: 2 mg via INTRAVENOUS

## 2019-07-10 MED ORDER — LIDOCAINE 2% (20 MG/ML) 5 ML SYRINGE
INTRAMUSCULAR | Status: DC | PRN
  Administered 2019-07-10: 100 mg via INTRAVENOUS

## 2019-07-10 MED ORDER — FENTANYL CITRATE (PF) 250 MCG/5ML IJ SOLN
INTRAMUSCULAR | Status: DC | PRN
  Administered 2019-07-10 (×2): 50 ug via INTRAVENOUS

## 2019-07-10 MED ORDER — HYDROMORPHONE HCL 1 MG/ML IJ SOLN
INTRAMUSCULAR | Status: AC
  Filled 2019-07-10: qty 1

## 2019-07-10 MED ORDER — FENTANYL CITRATE (PF) 100 MCG/2ML IJ SOLN
25.0000 ug | INTRAMUSCULAR | Status: DC | PRN
  Administered 2019-07-10 (×2): 50 ug via INTRAVENOUS

## 2019-07-10 MED ORDER — MIDAZOLAM HCL 2 MG/2ML IJ SOLN
INTRAMUSCULAR | Status: AC
  Filled 2019-07-10: qty 2

## 2019-07-10 MED ORDER — ACETAMINOPHEN 10 MG/ML IV SOLN
INTRAVENOUS | Status: AC
  Filled 2019-07-10: qty 100

## 2019-07-10 MED ORDER — DEXAMETHASONE SODIUM PHOSPHATE 10 MG/ML IJ SOLN
INTRAMUSCULAR | Status: DC | PRN
  Administered 2019-07-10: 10 mg via INTRAVENOUS

## 2019-07-10 MED ORDER — FENTANYL CITRATE (PF) 250 MCG/5ML IJ SOLN
INTRAMUSCULAR | Status: AC
  Filled 2019-07-10: qty 5

## 2019-07-10 MED ORDER — CEFAZOLIN SODIUM-DEXTROSE 2-4 GM/100ML-% IV SOLN
2.0000 g | INTRAVENOUS | Status: AC
  Administered 2019-07-10: 2 g via INTRAVENOUS

## 2019-07-10 MED ORDER — LACTATED RINGERS IV SOLN
INTRAVENOUS | Status: DC
  Administered 2019-07-10 (×2): via INTRAVENOUS

## 2019-07-10 MED ORDER — OXYCODONE HCL 5 MG PO TABS
ORAL_TABLET | ORAL | Status: AC
  Filled 2019-07-10: qty 1

## 2019-07-10 MED ORDER — ACETAMINOPHEN 10 MG/ML IV SOLN
1000.0000 mg | Freq: Four times a day (QID) | INTRAVENOUS | Status: DC
  Administered 2019-07-10: 1000 mg via INTRAVENOUS

## 2019-07-10 MED ORDER — OXYCODONE-ACETAMINOPHEN 5-325 MG PO TABS: 1.0000 | ORAL_TABLET | ORAL | 0 refills | Status: DC | PRN

## 2019-07-10 MED ORDER — PHENYLEPHRINE 40 MCG/ML (10ML) SYRINGE FOR IV PUSH (FOR BLOOD PRESSURE SUPPORT)
PREFILLED_SYRINGE | INTRAVENOUS | Status: DC | PRN
  Administered 2019-07-10 (×2): 80 ug via INTRAVENOUS
  Administered 2019-07-10: 120 ug via INTRAVENOUS

## 2019-07-10 MED ORDER — CHLORHEXIDINE GLUCONATE 4 % EX LIQD: 60.0000 mL | Freq: Once | CUTANEOUS | Status: DC

## 2019-07-10 MED ORDER — LIDOCAINE 2% (20 MG/ML) 5 ML SYRINGE
INTRAMUSCULAR | Status: AC
  Filled 2019-07-10: qty 5

## 2019-07-10 MED ORDER — 0.9 % SODIUM CHLORIDE (POUR BTL) OPTIME
TOPICAL | Status: DC | PRN
  Administered 2019-07-10: 1000 mL

## 2019-07-10 MED ORDER — PROPOFOL 10 MG/ML IV BOLUS
INTRAVENOUS | Status: DC | PRN
  Administered 2019-07-10: 150 mg via INTRAVENOUS

## 2019-07-10 MED ORDER — ONDANSETRON HCL 4 MG/2ML IJ SOLN
INTRAMUSCULAR | Status: DC | PRN
  Administered 2019-07-10: 4 mg via INTRAVENOUS

## 2019-07-10 MED ORDER — HYDROMORPHONE HCL 1 MG/ML IJ SOLN
0.2500 mg | INTRAMUSCULAR | Status: AC | PRN
  Administered 2019-07-10 (×4): 0.5 mg via INTRAVENOUS

## 2019-07-10 MED ORDER — ACETAMINOPHEN 500 MG PO TABS: 1000.0000 mg | ORAL_TABLET | Freq: Once | ORAL | Status: DC

## 2019-07-10 MED ORDER — OXYCODONE HCL 5 MG PO TABS
5.0000 mg | ORAL_TABLET | Freq: Once | ORAL | Status: AC
  Administered 2019-07-10: 5 mg via ORAL

## 2019-07-10 MED ORDER — CEFAZOLIN SODIUM-DEXTROSE 2-4 GM/100ML-% IV SOLN
INTRAVENOUS | Status: AC
  Filled 2019-07-10: qty 100

## 2019-07-10 MED ORDER — FENTANYL CITRATE (PF) 100 MCG/2ML IJ SOLN
INTRAMUSCULAR | Status: AC
  Filled 2019-07-10: qty 2

## 2019-07-10 SURGICAL SUPPLY — 30 items
BLADE SAW SGTL MED 73X18.5 STR (BLADE) IMPLANT
BLADE SURG 21 STRL SS (BLADE) ×2 IMPLANT
BNDG COHESIVE 4X5 TAN STRL (GAUZE/BANDAGES/DRESSINGS) ×2 IMPLANT
BNDG GAUZE ELAST 4 BULKY (GAUZE/BANDAGES/DRESSINGS) ×2 IMPLANT
COVER SURGICAL LIGHT HANDLE (MISCELLANEOUS) ×4 IMPLANT
COVER WAND RF STERILE (DRAPES) ×1 IMPLANT
DRAPE U-SHAPE 47X51 STRL (DRAPES) ×4 IMPLANT
DRSG ADAPTIC 3X8 NADH LF (GAUZE/BANDAGES/DRESSINGS) ×2 IMPLANT
DRSG PAD ABDOMINAL 8X10 ST (GAUZE/BANDAGES/DRESSINGS) ×4 IMPLANT
DURAPREP 26ML APPLICATOR (WOUND CARE) ×2 IMPLANT
ELECT REM PT RETURN 9FT ADLT (ELECTROSURGICAL) ×2
ELECTRODE REM PT RTRN 9FT ADLT (ELECTROSURGICAL) ×1 IMPLANT
GAUZE SPONGE 4X4 12PLY STRL (GAUZE/BANDAGES/DRESSINGS) ×2 IMPLANT
GLOVE BIOGEL PI IND STRL 9 (GLOVE) ×1 IMPLANT
GLOVE BIOGEL PI INDICATOR 9 (GLOVE) ×1
GLOVE SURG ORTHO 9.0 STRL STRW (GLOVE) ×2 IMPLANT
GOWN STRL REUS W/ TWL XL LVL3 (GOWN DISPOSABLE) ×2 IMPLANT
GOWN STRL REUS W/TWL XL LVL3 (GOWN DISPOSABLE) ×8
KIT BASIN OR (CUSTOM PROCEDURE TRAY) ×2 IMPLANT
KIT DRSG PREVENA PLUS 7DAY 125 (MISCELLANEOUS) ×1 IMPLANT
KIT PREVENA INCISION MGT 13 (CANNISTER) ×1 IMPLANT
KIT TURNOVER KIT B (KITS) ×2 IMPLANT
NS IRRIG 1000ML POUR BTL (IV SOLUTION) ×2 IMPLANT
PACK ORTHO EXTREMITY (CUSTOM PROCEDURE TRAY) ×2 IMPLANT
PAD ARMBOARD 7.5X6 YLW CONV (MISCELLANEOUS) ×2 IMPLANT
STOCKINETTE IMPERVIOUS LG (DRAPES) IMPLANT
SUT ETHILON 2 0 PSLX (SUTURE) ×3 IMPLANT
TOWEL GREEN STERILE (TOWEL DISPOSABLE) ×2 IMPLANT
TUBE CONNECTING 12X1/4 (SUCTIONS) ×2 IMPLANT
YANKAUER SUCT BULB TIP NO VENT (SUCTIONS) ×2 IMPLANT

## 2019-07-10 NOTE — Anesthesia Preprocedure Evaluation (Addendum)
Anesthesia Evaluation  Patient identified by MRN, date of birth, ID band Patient awake    Reviewed: Allergy & Precautions, NPO status , Patient's Chart, lab work & pertinent test results  Airway Mallampati: III  TM Distance: >3 FB Neck ROM: Full    Dental no notable dental hx. (+) Teeth Intact, Dental Advisory Given   Pulmonary neg pulmonary ROS, Current Smoker,    Pulmonary exam normal breath sounds clear to auscultation       Cardiovascular hypertension, negative cardio ROS Normal cardiovascular exam Rhythm:Regular Rate:Normal     Neuro/Psych negative neurological ROS  negative psych ROS   GI/Hepatic negative GI ROS, (+)     substance abuse  marijuana use,   Endo/Other  negative endocrine ROSdiabetes  Renal/GU negative Renal ROS  negative genitourinary   Musculoskeletal negative musculoskeletal ROS (+)   Abdominal   Peds  Hematology negative hematology ROS (+)   Anesthesia Other Findings   Reproductive/Obstetrics                            Anesthesia Physical Anesthesia Plan  ASA: III  Anesthesia Plan: General   Post-op Pain Management:    Induction: Intravenous  PONV Risk Score and Plan: Ondansetron, Dexamethasone and Midazolam  Airway Management Planned: LMA  Additional Equipment:   Intra-op Plan:   Post-operative Plan: Extubation in OR  Informed Consent: I have reviewed the patients History and Physical, chart, labs and discussed the procedure including the risks, benefits and alternatives for the proposed anesthesia with the patient or authorized representative who has indicated his/her understanding and acceptance.     Dental advisory given  Plan Discussed with: CRNA  Anesthesia Plan Comments:         Anesthesia Quick Evaluation

## 2019-07-10 NOTE — Anesthesia Procedure Notes (Signed)
Procedure Name: LMA Insertion Date/Time: 07/10/2019 12:58 PM Performed by: Jearld Pies, CRNA Pre-anesthesia Checklist: Patient identified, Emergency Drugs available, Suction available and Patient being monitored Patient Re-evaluated:Patient Re-evaluated prior to induction Oxygen Delivery Method: Circle System Utilized Preoxygenation: Pre-oxygenation with 100% oxygen Induction Type: IV induction Ventilation: Mask ventilation without difficulty LMA: LMA inserted LMA Size: 4.0 Number of attempts: 1 Airway Equipment and Method: Bite block Placement Confirmation: positive ETCO2 Tube secured with: Tape Dental Injury: Teeth and Oropharynx as per pre-operative assessment

## 2019-07-10 NOTE — Transfer of Care (Signed)
Immediate Anesthesia Transfer of Care Note  Patient: NATALIO SALOIS  Procedure(s) Performed: LEFT FOOT 1ST RAY AMPUTATION (Left )  Patient Location: PACU  Anesthesia Type:General  Level of Consciousness: drowsy  Airway & Oxygen Therapy: Patient Spontanous Breathing and Patient connected to face mask oxygen  Post-op Assessment: Report given to RN and Post -op Vital signs reviewed and stable  Post vital signs: Reviewed and stable  Last Vitals:  Vitals Value Taken Time  BP 120/77   Temp    Pulse 72   Resp 16   SpO2 100     Last Pain:  Vitals:   07/10/19 1044  TempSrc: Oral         Complications: No apparent anesthesia complications

## 2019-07-10 NOTE — Op Note (Signed)
07/10/2019  2:25 PM  PATIENT:  Scott Greer    PRE-OPERATIVE DIAGNOSIS:  Dehiscence Left Great Toe Amputation  POST-OPERATIVE DIAGNOSIS:  Same  PROCEDURE:  LEFT FOOT 1ST RAY AMPUTATION Application of 13 cm Praveena wound VAC.  SURGEON:  Newt Minion, MD  PHYSICIAN ASSISTANT:None ANESTHESIA:   General  PREOPERATIVE INDICATIONS:  Scott Greer is a  63 y.o. male with a diagnosis of Dehiscence Left Great Toe Amputation who failed conservative measures and elected for surgical management.    The risks benefits and alternatives were discussed with the patient preoperatively including but not limited to the risks of infection, bleeding, nerve injury, cardiopulmonary complications, the need for revision surgery, among others, and the patient was willing to proceed.  OPERATIVE IMPLANTS: Praveena wound VAC 13 cm  @ENCIMAGES @  OPERATIVE FINDINGS: Good bleeding tissue at the wound margins without necrosis.  No abscess.  OPERATIVE PROCEDURE: Patient was brought the operating room and underwent a regional anesthetic.  After adequate levels anesthesia were obtained patient's left lower extremity was prepped using DuraPrep draped into a sterile field a timeout was called.  A racquet incision was made around the ulcerative tissue and the first metatarsal.  The first metatarsal and the ulcerative tissue were amputated in one block of tissue.  Electrocautery was used for hemostasis the wound was irrigated with normal saline.  The incision was closed using 2-0 nylon.  A Praveena wound VAC was applied this had a good suction fit patient was taken the PACU in stable condition.   DISCHARGE PLANNING:  Antibiotic duration: Preoperative antibiotics continue oral antibiotics at home  Weightbearing: Touchdown weightbearing on the left  Pain medication: Prescription for Percocet  Dressing care/ Wound VAC: Continue wound VAC for 1 week  Ambulatory devices: Walker or crutches  Discharge to:  Home.  Follow-up: In the office 1 week post operative.

## 2019-07-10 NOTE — H&P (Signed)
Scott Greer is an 63 y.o. male.   Chief Complaint: Dehiscence left great toe amputation site HPI: The patient is a 63 year old gentleman who presents 4 weeks status post a left great toe amputation.  He has developed wound dehiscence and presents today for left first ray amputation.  His last hemoglobin A1c was 10.3.  History of tobacco abuse and continues to smoke cigars. Past Medical History:  Diagnosis Date  . Dehiscence of amputation stump (HCC)    left great toe  . Diabetes mellitus without complication ([place]) 5009  . Glaucoma 2015  . Hyperlipidemia   . Hypertension 2005  . Wears glasses     Past Surgical History:  Procedure Laterality Date  . AMPUTATION Left 06/05/2019   Procedure: LEFT GREAT TOE AMPUTATION;  Surgeon: Newt Minion, MD;  Location: [place];  Service: Orthopedics;  Laterality: Left;  . NO PAST SURGERIES      Family History  Problem Relation Age of Onset  . Stroke Mother   . Diabetes Mother        Toward end of life   Social History:  reports that he has been smoking cigars. He has quit using smokeless tobacco.  His smokeless tobacco use included chew. He reports current alcohol use. He reports current drug use. Drug: Marijuana.  Allergies:  Allergies  Allergen Reactions  . Bee Venom Hives, Itching and Swelling    No medications prior to admission.    No results found for this or any previous visit (from the past 48 hour(s)). No results found.  Review of Systems  All other systems reviewed and are negative.   There were no vitals taken for this visit. Physical Exam  Constitutional: He is oriented to person, place, and time. He appears well-developed and well-nourished. No distress.  HENT:  Head: Normocephalic and atraumatic.  Neck: No tracheal deviation present. No thyromegaly present.  Cardiovascular: Normal rate.  Respiratory: Effort normal. No stridor. No respiratory distress.  GI: Soft. He exhibits no distension.  Musculoskeletal:   Comments: Examination patient has a palpable dorsalis pedis and posterior tibial pulse he has good macro circulation to the ankle.  Patient does have over 30-year history of tobacco use and is still smoking cigars.  Wound shows complete dehiscence of the residual stump with exposed metatarsal head.   Neurological: He is alert and oriented to person, place, and time. No cranial nerve deficit. Coordination normal.  Skin: Skin is warm.  Psychiatric: He has a normal mood and affect. His behavior is normal. Judgment and thought content normal.     Assessment/Plan Left great toe amputation site dehiscence-plan left first ray amputation-the procedure and benefits and risk including the risk of bleeding, infection, neurovascular injury, and possible need for further surgery were discussed at length with the patient and his questions answered.  The patient wishes to proceed with surgery at this time.  Erlinda Hong, PA-C 07/10/2019, 7:52 AM [facility] [phone]

## 2019-07-10 NOTE — Anesthesia Postprocedure Evaluation (Signed)
Anesthesia Post Note  Patient: JEREME LOREN  Procedure(s) Performed: LEFT FOOT 1ST RAY AMPUTATION (Left )     Patient location during evaluation: PACU Anesthesia Type: General Level of consciousness: awake and alert Pain management: pain level controlled Vital Signs Assessment: post-procedure vital signs reviewed and stable Respiratory status: spontaneous breathing, nonlabored ventilation, respiratory function stable and patient connected to nasal cannula oxygen Cardiovascular status: blood pressure returned to baseline and stable Postop Assessment: no apparent nausea or vomiting Anesthetic complications: no    Last Vitals:  Vitals:   07/10/19 1345 07/10/19 1400  BP: (!) 157/88 (!) 154/90  Pulse: 75 78  Resp: 13 12  Temp:    SpO2: 100% 100%    Last Pain:  Vitals:   07/10/19 1400  TempSrc:   PainSc: Asleep                 Maelani Yarbro L Lavern Maslow

## 2019-07-11 ENCOUNTER — Encounter (HOSPITAL_COMMUNITY): Payer: Self-pay | Admitting: Orthopedic Surgery

## 2019-07-12 ENCOUNTER — Emergency Department (HOSPITAL_COMMUNITY)
Admission: EM | Admit: 2019-07-12 | Discharge: 2019-07-12 | Disposition: A | Discharge status: Home / Self Care | Payer: Self-pay | Attending: Emergency Medicine | Admitting: Emergency Medicine

## 2019-07-12 DIAGNOSIS — Z4801 Encounter for change or removal of surgical wound dressing: Secondary | ICD-10-CM | POA: Insufficient documentation

## 2019-07-12 DIAGNOSIS — Z5321 Procedure and treatment not carried out due to patient leaving prior to being seen by health care provider: Secondary | ICD-10-CM | POA: Insufficient documentation

## 2019-07-12 NOTE — ED Notes (Signed)
Pt figured out how to charge his wound drain and no longer wants to be seen. Pt not triaged.

## 2019-07-14 ENCOUNTER — Telehealth: Payer: Self-pay | Admitting: Orthopedic Surgery

## 2019-07-14 NOTE — Telephone Encounter (Signed)
Patient called advised he is suppose to have an earlier appointment instead of 07/27/2019. Patient advised he was told he should have an earlier due to the wound vac he is wearing. The number to contact patient is [phone]

## 2019-07-14 NOTE — Telephone Encounter (Signed)
You are calling this pt for appt tomorrow.

## 2019-07-20 ENCOUNTER — Ambulatory Visit: Payer: Self-pay | Admitting: Orthopedic Surgery

## 2019-07-20 ENCOUNTER — Ambulatory Visit: Payer: Self-pay

## 2019-07-21 ENCOUNTER — Ambulatory Visit (INDEPENDENT_AMBULATORY_CARE_PROVIDER_SITE_OTHER): Payer: Self-pay | Admitting: Orthopedic Surgery

## 2019-07-21 ENCOUNTER — Encounter: Payer: Self-pay | Admitting: Orthopedic Surgery

## 2019-07-21 VITALS — Ht 75.0 in | Wt 220.0 lb

## 2019-07-21 DIAGNOSIS — Z89412 Acquired absence of left great toe: Secondary | ICD-10-CM

## 2019-07-21 DIAGNOSIS — S98112A Complete traumatic amputation of left great toe, initial encounter: Secondary | ICD-10-CM

## 2019-07-21 MED ORDER — OXYCODONE-ACETAMINOPHEN 5-325 MG PO TABS: 1.0000 | ORAL_TABLET | ORAL | 0 refills | Status: DC | PRN

## 2019-07-21 NOTE — Progress Notes (Signed)
Office Visit Note   Patient: Scott Greer           Date of Birth: 1955/11/27           MRN: [id number] Visit Date: 07/21/2019              Requested by: No referring provider defined for this encounter. PCP: Patient, No Pcp Per  Chief Complaint  Patient presents with  . Left Foot - Routine Post Op    07/10/19 left foot 1st ray amputation       HPI: Patient is a 63 year old gentleman who presents 11 days status post left first ray amputation. Is weight bearing with crutches. Wound vac stopped draining and disconnected the house 5 days ago.  Has been having some drainage of the wound VAC dressing as well as a foul odor.    Assessment & Plan: Visit Diagnoses:  No diagnosis found.  Plan: Have provided a refill of his pain medication he will follow-up in the office in 1 week for reevaluation.  Begin daily Dial soap cleansing and dry dressing changes.  Given a new postop shoe today.  Follow-Up Instructions: Return in about 1 week (around 07/28/2019).   Ortho Exam  Patient is alert, oriented, no adenopathy, well-dressed, normal affect, normal respiratory effort. Examination patient has a palpable dorsalis pedis.  On examination of the first ray amputation on the left he has significant maceration to the incision and plantar aspect of his foot.  There is some moderate swelling to the foot and ankle.  There is no erythema no purulence no sign of infection Imaging: No results found. No images are attached to the encounter.  Labs: Lab Results  Component Value Date   HGBA1C 10.3 (H) 06/04/2019   ESRSEDRATE 78 (H) 06/04/2019   CRP 4.5 (H) 06/04/2019   REPTSTATUS 06/10/2019 FINAL 06/05/2019   CULT  06/05/2019    NO GROWTH 5 DAYS Performed at [facility], [place] [address] [place], [place] [address]      Lab Results  Component Value Date   ALBUMIN 3.4 (L) 06/03/2019   ALBUMIN 3.6 05/14/2019   PREALBUMIN 13.8 (L) 06/04/2019    No results found for: MG No results  found for: VD25OH  Lab Results  Component Value Date   PREALBUMIN 13.8 (L) 06/04/2019   CBC EXTENDED Latest Ref Rng & Units 07/10/2019 06/08/2019 06/07/2019  WBC 4.0 - 10.5 K/uL 6.8 6.1 8.7  RBC 4.22 - 5.81 MIL/uL 3.90(L) 3.49(L) 3.85(L)  HGB 13.0 - 17.0 g/dL 11.2(L) 10.1(L) 11.1(L)  HCT 39.0 - 52.0 % 34.7(L) 30.1(L) 32.9(L)  PLT 150 - 400 K/uL 273 242 261  NEUTROABS 1.7 - 7.7 K/uL - - -  LYMPHSABS 0.7 - 4.0 K/uL - - -     Body mass index is 27.5 kg/m.  Orders:  No orders of the defined types were placed in this encounter.  No orders of the defined types were placed in this encounter.    Procedures: No procedures performed  Clinical Data: No additional findings.  ROS:  All other systems negative, except as noted in the HPI. Review of Systems  Constitutional: Negative for chills and fever.  Cardiovascular: Positive for leg swelling.    Objective: Vital Signs: Ht 6\' 3"  (1.905 m)   Wt 220 lb (99.8 kg)   BMI 27.50 kg/m   Specialty Comments:  No specialty comments available.  PMFS History: Patient Active Problem List   Diagnosis Date Noted  . Osteomyelitis ([place]) 06/04/2019  .  Diabetic foot infection (HCC)   . Noncompliance with medication regimen   . Glaucoma   . Hyperlipidemia   . Hypertension 11/06/2003  . Diabetes mellitus (HCC) 11/05/1997   Past Medical History:  Diagnosis Date  . Dehiscence of amputation stump (HCC)    left great toe  . Diabetes mellitus without complication (HCC) 1999  . Glaucoma 2015  . Hyperlipidemia   . Hypertension 2005  . Wears glasses     Family History  Problem Relation Age of Onset  . Stroke Mother   . Diabetes Mother        Toward end of life    Past Surgical History:  Procedure Laterality Date  . AMPUTATION Left 06/05/2019   Procedure: LEFT GREAT TOE AMPUTATION;  Surgeon: [name], MD;  Location: [facility] OR;  Service: Orthopedics;  Laterality: Left;  . AMPUTATION Left 07/10/2019   Procedure: LEFT FOOT 1ST RAY  AMPUTATION;  Surgeon: [name], MD;  Location: [facility] OR;  Service: Orthopedics;  Laterality: Left;  . NO PAST SURGERIES     Social History   Occupational History  . Not on file  Tobacco Use  . Smoking status: Current Every Day Smoker    Types: Cigars  . Smokeless tobacco: Former Neurosurgeon    Types: Chew  . Tobacco comment: 2 daily  Substance and Sexual Activity  . Alcohol use: Yes    Comment: occasional  . Drug use: Yes    Types: Marijuana  . Sexual activity: Not on file

## 2019-07-27 ENCOUNTER — Ambulatory Visit: Payer: Self-pay | Admitting: Orthopedic Surgery

## 2019-07-30 ENCOUNTER — Encounter: Payer: Self-pay | Admitting: Orthopedic Surgery

## 2019-07-30 ENCOUNTER — Ambulatory Visit (INDEPENDENT_AMBULATORY_CARE_PROVIDER_SITE_OTHER): Payer: Self-pay | Admitting: Orthopedic Surgery

## 2019-07-30 VITALS — Ht 75.0 in | Wt 220.0 lb

## 2019-07-30 DIAGNOSIS — S98112A Complete traumatic amputation of left great toe, initial encounter: Secondary | ICD-10-CM

## 2019-08-04 ENCOUNTER — Encounter: Payer: Self-pay | Admitting: Orthopedic Surgery

## 2019-08-04 NOTE — Progress Notes (Signed)
Office Visit Note   Patient: Scott Greer           Date of Birth: 07-Nov-1955           MRN: [id number] Visit Date: 07/30/2019              Requested by: No referring provider defined for this encounter. PCP: Patient, No Pcp Per  Chief Complaint  Patient presents with  . Left Foot - Routine Post Op    07/10/2019 left foot 1st 06/05/2019 left foot GT      HPI: Patient is a 63 year old gentleman who is status post revision left great toe amputation with left first ray amputation.  Patient states that he could not afford pain pills.  He states his been out of work for 2-1/2 months now.  Patient is currently weightbearing in the postoperative shoe and crutches.  Assessment & Plan: Visit Diagnoses:  1. Amputation of left great toe (HCC)     Plan: Sutures harvested dry dressing applied reevaluate in 3 weeks.  Follow-Up Instructions: Return in about 2 weeks (around 08/13/2019).   Ortho Exam  Patient is alert, oriented, no adenopathy, well-dressed, normal affect, normal respiratory effort. Examination patient has slight wound dehiscence there is no redness no cellulitis no drainage no signs of infection.  Imaging: No results found. No images are attached to the encounter.  Labs: Lab Results  Component Value Date   HGBA1C 10.3 (H) 06/04/2019   ESRSEDRATE 78 (H) 06/04/2019   CRP 4.5 (H) 06/04/2019   REPTSTATUS 06/10/2019 FINAL 06/05/2019   CULT  06/05/2019    NO GROWTH 5 DAYS Performed at [facility], [place] [address], [place], [place] [address]      Lab Results  Component Value Date   ALBUMIN 3.4 (L) 06/03/2019   ALBUMIN 3.6 05/14/2019   PREALBUMIN 13.8 (L) 06/04/2019    No results found for: MG No results found for: VD25OH  Lab Results  Component Value Date   PREALBUMIN 13.8 (L) 06/04/2019   CBC EXTENDED Latest Ref Rng & Units 07/10/2019 06/08/2019 06/07/2019  WBC 4.0 - 10.5 K/uL 6.8 6.1 8.7  RBC 4.22 - 5.81 MIL/uL 3.90(L) 3.49(L) 3.85(L)  HGB 13.0 -  17.0 g/dL 11.2(L) 10.1(L) 11.1(L)  HCT 39.0 - 52.0 % 34.7(L) 30.1(L) 32.9(L)  PLT 150 - 400 K/uL 273 242 261  NEUTROABS 1.7 - 7.7 K/uL - - -  LYMPHSABS 0.7 - 4.0 K/uL - - -     Body mass index is 27.5 kg/m.  Orders:  No orders of the defined types were placed in this encounter.  No orders of the defined types were placed in this encounter.    Procedures: No procedures performed  Clinical Data: No additional findings.  ROS:  All other systems negative, except as noted in the HPI. Review of Systems  Objective: Vital Signs: Ht 6\' 3"  (1.905 m)   Wt 220 lb (99.8 kg)   BMI 27.50 kg/m   Specialty Comments:  No specialty comments available.  PMFS History: Patient Active Problem List   Diagnosis Date Noted  . Osteomyelitis ([place]) 06/04/2019  . Diabetic foot infection ([place])   . Noncompliance with medication regimen   . Glaucoma   . Hyperlipidemia   . Hypertension 11/06/2003  . Diabetes mellitus ([place]) 11/05/1997   Past Medical History:  Diagnosis Date  . Dehiscence of amputation stump (HCC)    left great toe  . Diabetes mellitus without complication ([place]) 8242  . Glaucoma 2015  .  Hyperlipidemia   . Hypertension 2005  . Wears glasses     Family History  Problem Relation Age of Onset  . Stroke Mother   . Diabetes Mother        Toward end of life    Past Surgical History:  Procedure Laterality Date  . AMPUTATION Left 06/05/2019   Procedure: LEFT GREAT TOE AMPUTATION;  Surgeon: [name], MD;  Location: [facility] OR;  Service: Orthopedics;  Laterality: Left;  . AMPUTATION Left 07/10/2019   Procedure: LEFT FOOT 1ST RAY AMPUTATION;  Surgeon: [name], MD;  Location: [facility] OR;  Service: Orthopedics;  Laterality: Left;  . NO PAST SURGERIES     Social History   Occupational History  . Not on file  Tobacco Use  . Smoking status: Current Every Day Smoker    Types: Cigars  . Smokeless tobacco: Former Neurosurgeon    Types: Chew  . Tobacco comment: 2 daily  Substance  and Sexual Activity  . Alcohol use: Yes    Comment: occasional  . Drug use: Yes    Types: Marijuana  . Sexual activity: Not on file

## 2019-08-05 ENCOUNTER — Inpatient Hospital Stay: Payer: Self-pay | Admitting: Family Medicine

## 2019-08-13 ENCOUNTER — Encounter: Payer: Self-pay | Admitting: Orthopedic Surgery

## 2019-08-13 ENCOUNTER — Ambulatory Visit (INDEPENDENT_AMBULATORY_CARE_PROVIDER_SITE_OTHER): Payer: Self-pay | Admitting: Orthopedic Surgery

## 2019-08-13 VITALS — Ht 75.0 in | Wt 220.0 lb

## 2019-08-13 DIAGNOSIS — Z89412 Acquired absence of left great toe: Secondary | ICD-10-CM

## 2019-08-13 DIAGNOSIS — S98112A Complete traumatic amputation of left great toe, initial encounter: Secondary | ICD-10-CM

## 2019-08-13 MED ORDER — OXYCODONE-ACETAMINOPHEN 5-325 MG PO TABS: 1.0000 | ORAL_TABLET | ORAL | 0 refills | Status: DC | PRN

## 2019-08-14 ENCOUNTER — Encounter: Payer: Self-pay | Admitting: Orthopedic Surgery

## 2019-08-14 NOTE — Progress Notes (Signed)
Office Visit Note   Patient: Scott Greer           Date of Birth: 12/24/1955           MRN: [id number] Visit Date: 08/13/2019              Requested by: No referring provider defined for this encounter. PCP: Patient, No Pcp Per  Chief Complaint  Patient presents with  . Left Foot - Routine Post Op    07/10/2019 LEFT FOOT 1ST RAY      HPI: Patient is a 63 year old gentleman who is 4 weeks status post left foot first ray amputation.  Patient states he cannot afford dressing supplies or pain medicine for antibiotics.  He is currently using crutches wearing a postoperative shoe patient feels like he still has drainage.  Assessment & Plan: Visit Diagnoses:  1. Amputation of left great toe ([place])     Plan: Recommend that he put weight only on the heel recommended elevation he does have pain with his leg dependent patient was given some medical supplies a prescription for pain medicine was called into the [facility] [place].  Follow-Up Instructions: No follow-ups on file.   Ortho Exam  Patient is alert, oriented, no adenopathy, well-dressed, normal affect, normal respiratory effort. Examination patient does have an open wound from the left ray amputation.  There is no exposed bone or tendon no cellulitis there is improved granulation tissue Iodosorb dressing was applied  Imaging: No results found. No images are attached to the encounter.  Labs: Lab Results  Component Value Date   HGBA1C 10.3 (H) 06/04/2019   ESRSEDRATE 78 (H) 06/04/2019   CRP 4.5 (H) 06/04/2019   REPTSTATUS 06/10/2019 FINAL 06/05/2019   CULT  06/05/2019    NO GROWTH 5 DAYS Performed at [facility], [place] [address], [place], [place] [address]      Lab Results  Component Value Date   ALBUMIN 3.4 (L) 06/03/2019   ALBUMIN 3.6 05/14/2019   PREALBUMIN 13.8 (L) 06/04/2019    No results found for: MG No results found for: VD25OH  Lab Results  Component Value Date   PREALBUMIN 13.8 (L)  06/04/2019   CBC EXTENDED Latest Ref Rng & Units 07/10/2019 06/08/2019 06/07/2019  WBC 4.0 - 10.5 K/uL 6.8 6.1 8.7  RBC 4.22 - 5.81 MIL/uL 3.90(L) 3.49(L) 3.85(L)  HGB 13.0 - 17.0 g/dL 11.2(L) 10.1(L) 11.1(L)  HCT 39.0 - 52.0 % 34.7(L) 30.1(L) 32.9(L)  PLT 150 - 400 K/uL 273 242 261  NEUTROABS 1.7 - 7.7 K/uL - - -  LYMPHSABS 0.7 - 4.0 K/uL - - -     Body mass index is 27.5 kg/m.  Orders:  No orders of the defined types were placed in this encounter.  Meds ordered this encounter  Medications  . oxyCODONE-acetaminophen (PERCOCET/ROXICET) 5-325 MG tablet    Sig: Take 1 tablet by mouth every 4 (four) hours as needed for severe pain.    Dispense:  20 tablet    Refill:  0     Procedures: No procedures performed  Clinical Data: No additional findings.  ROS:  All other systems negative, except as noted in the HPI. Review of Systems  Objective: Vital Signs: Ht 6\' 3"  (1.905 m)   Wt 220 lb (99.8 kg)   BMI 27.50 kg/m   Specialty Comments:  No specialty comments available.  PMFS History: Patient Active Problem List   Diagnosis Date Noted  . Osteomyelitis ([place]) 06/04/2019  . Diabetic  foot infection (HCC)   . Noncompliance with medication regimen   . Glaucoma   . Hyperlipidemia   . Hypertension 11/06/2003  . Diabetes mellitus (HCC) 11/05/1997   Past Medical History:  Diagnosis Date  . Dehiscence of amputation stump (HCC)    left great toe  . Diabetes mellitus without complication (HCC) 1999  . Glaucoma 2015  . Hyperlipidemia   . Hypertension 2005  . Wears glasses     Family History  Problem Relation Age of Onset  . Stroke Mother   . Diabetes Mother        Toward end of life    Past Surgical History:  Procedure Laterality Date  . AMPUTATION Left 06/05/2019   Procedure: LEFT GREAT TOE AMPUTATION;  Surgeon: [name], MD;  Location: [facility] OR;  Service: Orthopedics;  Laterality: Left;  . AMPUTATION Left 07/10/2019   Procedure: LEFT FOOT 1ST RAY AMPUTATION;   Surgeon: [name], MD;  Location: [facility] OR;  Service: Orthopedics;  Laterality: Left;  . NO PAST SURGERIES     Social History   Occupational History  . Not on file  Tobacco Use  . Smoking status: Current Every Day Smoker    Types: Cigars  . Smokeless tobacco: Former Neurosurgeon    Types: Chew  . Tobacco comment: 2 daily  Substance and Sexual Activity  . Alcohol use: Yes    Comment: occasional  . Drug use: Yes    Types: Marijuana  . Sexual activity: Not on file

## 2019-08-20 ENCOUNTER — Other Ambulatory Visit: Payer: Self-pay

## 2019-08-20 ENCOUNTER — Ambulatory Visit (INDEPENDENT_AMBULATORY_CARE_PROVIDER_SITE_OTHER): Payer: Self-pay | Admitting: Orthopedic Surgery

## 2019-08-20 DIAGNOSIS — Z89412 Acquired absence of left great toe: Secondary | ICD-10-CM

## 2019-08-20 DIAGNOSIS — S98112A Complete traumatic amputation of left great toe, initial encounter: Secondary | ICD-10-CM

## 2019-08-20 DIAGNOSIS — T8781 Dehiscence of amputation stump: Secondary | ICD-10-CM

## 2019-08-20 MED ORDER — OXYCODONE-ACETAMINOPHEN 5-325 MG PO TABS: 1.0000 | ORAL_TABLET | Freq: Four times a day (QID) | ORAL | 0 refills | Status: DC | PRN

## 2019-08-21 ENCOUNTER — Encounter: Payer: Self-pay | Admitting: Orthopedic Surgery

## 2019-08-21 NOTE — Progress Notes (Signed)
Office Visit Note   Patient: Scott Greer           Date of Birth: [birth year]           MRN: [id number] Visit Date: 08/20/2019              Requested by: No referring provider defined for this encounter. PCP: Patient, No Pcp Per  Chief Complaint  Patient presents with  . Left Foot - Routine Post Op      HPI: Patient is a 63 year old gentleman who presents status post left foot first ray amputation.  He is about 5 weeks out from surgery he is currently wearing a postoperative shoe using one crutch essentially full weightbearing.  He states that he is washing this daily using pain medicine.  Assessment & Plan: Visit Diagnoses:  1. Dehiscence of amputation stump ([place])   2. Amputation of left great toe [facility])     Plan: Discussed with the patient the importance again of smoking cessation.  Discussed that without wound healing he would require a transtibial amputation.  Recommended the importance of nonweightbearing.  Patient states he does not want to consider further surgery wants to continue with dressing changes.  We gave him some dressing supplies.  Follow-Up Instructions: Return in about 2 weeks (around 09/03/2019).   Ortho Exam  Patient is alert, oriented, no adenopathy, well-dressed, normal affect, normal respiratory effort. Examination there is dehiscence of the wound he has good dorsalis pedis and posterior tibial pulse which are biphasic by Doppler.  The wound shows some more ischemic changes most likely due to, poorly controlled diabetes, weightbearing, tobacco, and poor microcirculation.  The wound measures 1 cm in width 7 cm in length there is no cellulitis no exposed bone or tendon.  Imaging: No results found. No images are attached to the encounter.  Labs: Lab Results  Component Value Date   HGBA1C 10.3 (H) 06/04/2019   ESRSEDRATE 78 (H) 06/04/2019   CRP 4.5 (H) 06/04/2019   REPTSTATUS 06/10/2019 FINAL 06/05/2019   CULT  06/05/2019    NO GROWTH 5 DAYS  Performed at [facility], [place] [address] [place], [place] [address]      Lab Results  Component Value Date   ALBUMIN 3.4 (L) 06/03/2019   ALBUMIN 3.6 05/14/2019   PREALBUMIN 13.8 (L) 06/04/2019    No results found for: MG No results found for: VD25OH  Lab Results  Component Value Date   PREALBUMIN 13.8 (L) 06/04/2019   CBC EXTENDED Latest Ref Rng & Units 07/10/2019 06/08/2019 06/07/2019  WBC 4.0 - 10.5 K/uL 6.8 6.1 8.7  RBC 4.22 - 5.81 MIL/uL 3.90(L) 3.49(L) 3.85(L)  HGB 13.0 - 17.0 g/dL 11.2(L) 10.1(L) 11.1(L)  HCT 39.0 - 52.0 % 34.7(L) 30.1(L) 32.9(L)  PLT 150 - 400 K/uL 273 242 261  NEUTROABS 1.7 - 7.7 K/uL - - -  LYMPHSABS 0.7 - 4.0 K/uL - - -     There is no height or weight on file to calculate BMI.  Orders:  No orders of the defined types were placed in this encounter.  Meds ordered this encounter  Medications  . oxyCODONE-acetaminophen (PERCOCET) 5-325 MG tablet    Sig: Take 1 tablet by mouth every 6 (six) hours as needed for moderate pain or severe pain.    Dispense:  28 tablet    Refill:  0     Procedures: No procedures performed  Clinical Data: No additional findings.  ROS:  All other systems negative,  except as noted in the HPI. Review of Systems  Objective: Vital Signs: There were no vitals taken for this visit.  Specialty Comments:  No specialty comments available.  PMFS History: Patient Active Problem List   Diagnosis Date Noted  . Osteomyelitis (HCC) 06/04/2019  . Diabetic foot infection (HCC)   . Noncompliance with medication regimen   . Glaucoma   . Hyperlipidemia   . Hypertension 11/06/2003  . Diabetes mellitus (HCC) 11/05/1997   Past Medical History:  Diagnosis Date  . Dehiscence of amputation stump (HCC)    left great toe  . Diabetes mellitus without complication (HCC) 1999  . Glaucoma 2015  . Hyperlipidemia   . Hypertension 2005  . Wears glasses     Family History  Problem Relation Age of Onset  . Stroke  Mother   . Diabetes Mother        Toward end of life    Past Surgical History:  Procedure Laterality Date  . AMPUTATION Left 06/05/2019   Procedure: LEFT GREAT TOE AMPUTATION;  Surgeon: [name], MD;  Location: [facility] OR;  Service: Orthopedics;  Laterality: Left;  . AMPUTATION Left 07/10/2019   Procedure: LEFT FOOT 1ST RAY AMPUTATION;  Surgeon: [name], MD;  Location: [facility] OR;  Service: Orthopedics;  Laterality: Left;  . NO PAST SURGERIES     Social History   Occupational History  . Not on file  Tobacco Use  . Smoking status: Current Every Day Smoker    Types: Cigars  . Smokeless tobacco: Former Neurosurgeon    Types: Chew  . Tobacco comment: 2 daily  Substance and Sexual Activity  . Alcohol use: Yes    Comment: occasional  . Drug use: Yes    Types: Marijuana  . Sexual activity: Not on file

## 2019-09-04 ENCOUNTER — Telehealth: Payer: Self-pay | Admitting: Orthopedic Surgery

## 2019-09-04 MED ORDER — OXYCODONE-ACETAMINOPHEN 5-325 MG PO TABS: 1.0000 | ORAL_TABLET | Freq: Three times a day (TID) | ORAL | 0 refills | Status: DC | PRN

## 2019-09-04 NOTE — Telephone Encounter (Signed)
Patient called needing Rx refilled (Oxycodone) Patient uses the [place] [address]. Ph# number to [facility] [phone]   Patient said he is out of his medication. The number to contact patient is [phone]

## 2019-09-04 NOTE — Telephone Encounter (Signed)
Pt is s/p a 1st ray amputation on 07/08/19 he is asking for refill on pain medication please advise.

## 2019-09-07 ENCOUNTER — Ambulatory Visit: Payer: Self-pay | Admitting: Orthopedic Surgery

## 2019-09-17 ENCOUNTER — Other Ambulatory Visit: Payer: Self-pay

## 2019-09-17 ENCOUNTER — Encounter: Payer: Self-pay | Admitting: Orthopedic Surgery

## 2019-09-17 ENCOUNTER — Ambulatory Visit (INDEPENDENT_AMBULATORY_CARE_PROVIDER_SITE_OTHER): Payer: Self-pay | Admitting: Orthopedic Surgery

## 2019-09-17 VITALS — Ht 75.0 in | Wt 220.0 lb

## 2019-09-17 DIAGNOSIS — T8781 Dehiscence of amputation stump: Secondary | ICD-10-CM

## 2019-09-17 DIAGNOSIS — S98112A Complete traumatic amputation of left great toe, initial encounter: Secondary | ICD-10-CM

## 2019-09-17 MED ORDER — OXYCODONE-ACETAMINOPHEN 5-325 MG PO TABS: 1.0000 | ORAL_TABLET | Freq: Three times a day (TID) | ORAL | 0 refills | Status: DC | PRN

## 2019-09-17 MED FILL — metFORMIN HCL 1000 MG TABS: 1000 | 30 days supply | Qty: 60 | Fill #0

## 2019-09-17 NOTE — Progress Notes (Signed)
Office Visit Note   Patient: Scott Greer           Date of Birth: [birth year]           MRN: [id number] Visit Date: 09/17/2019              Requested by: No referring provider defined for this encounter. PCP: Patient, No Pcp Per  Chief Complaint  Patient presents with  . Left Foot - Routine Post Op    07/10/19 left foot 1st ray amputation       HPI: Patient is a 63 year old gentleman who was seen in follow-up for his left foot status post first ray amputation.  Patient has had interval healing he is still full weightbearing patient states he does not have funds for diabetic medicine and has not been taking any diabetic medicine.  Assessment & Plan: Visit Diagnoses:  1. Dehiscence of amputation stump (HCC)   2. Amputation of left great toe [facility])     Plan: Recommended patient go at this time to community health and wellness to establish care for treatment of his diabetes hypertension and general medical conditions.  Recommend continue antibiotic ointment dressing changes daily follow-up in 2 weeks.  He was given supplies for dressing changes.  Follow-Up Instructions: Return in about 2 weeks (around 10/01/2019).   Ortho Exam  Patient is alert, oriented, no adenopathy, well-dressed, normal affect, normal respiratory effort. Examination patient has had interval healing he has epiboly around the wound edges with rolled up soft tissue.  After informed consent a 10 blade knife was used to debride the skin and soft tissue back to bleeding viable granulation tissue there is no exposed bone or tendon.  The ulcer is 2 cm in diameter and 7 mm deep.  There is good petechial bleeding no signs of infection.  Imaging: No results found. No images are attached to the encounter.  Labs: Lab Results  Component Value Date   HGBA1C 10.3 (H) 06/04/2019   ESRSEDRATE 78 (H) 06/04/2019   CRP 4.5 (H) 06/04/2019   REPTSTATUS 06/10/2019 FINAL 06/05/2019   CULT  06/05/2019    NO GROWTH 5 DAYS  Performed at [facility], [address], [place], [place] [address]      Lab Results  Component Value Date   ALBUMIN 3.4 (L) 06/03/2019   ALBUMIN 3.6 05/14/2019   PREALBUMIN 13.8 (L) 06/04/2019    No results found for: MG No results found for: VD25OH  Lab Results  Component Value Date   PREALBUMIN 13.8 (L) 06/04/2019   CBC EXTENDED Latest Ref Rng & Units 07/10/2019 06/08/2019 06/07/2019  WBC 4.0 - 10.5 K/uL 6.8 6.1 8.7  RBC 4.22 - 5.81 MIL/uL 3.90(L) 3.49(L) 3.85(L)  HGB 13.0 - 17.0 g/dL 11.2(L) 10.1(L) 11.1(L)  HCT 39.0 - 52.0 % 34.7(L) 30.1(L) 32.9(L)  PLT 150 - 400 K/uL 273 242 261  NEUTROABS 1.7 - 7.7 K/uL - - -  LYMPHSABS 0.7 - 4.0 K/uL - - -     Body mass index is 27.5 kg/m.  Orders:  No orders of the defined types were placed in this encounter.  No orders of the defined types were placed in this encounter.    Procedures: No procedures performed  Clinical Data: No additional findings.  ROS:  All other systems negative, except as noted in the HPI. Review of Systems  Objective: Vital Signs: Ht 6\' 3"  (1.905 m)   Wt 220 lb (99.8 kg)   BMI 27.50 kg/m  Specialty Comments:  No specialty comments available.  PMFS History: Patient Active Problem List   Diagnosis Date Noted  . Osteomyelitis ([place]) 06/04/2019  . Diabetic foot infection ([place])   . Noncompliance with medication regimen   . Glaucoma   . Hyperlipidemia   . Hypertension 11/06/2003  . Diabetes mellitus ([place]) 11/05/1997   Past Medical History:  Diagnosis Date  . Dehiscence of amputation stump (HCC)    left great toe  . Diabetes mellitus without complication ([place]) 6063  . Glaucoma 2015  . Hyperlipidemia   . Hypertension 2005  . Wears glasses     Family History  Problem Relation Age of Onset  . Stroke Mother   . Diabetes Mother        Toward end of life    Past Surgical History:  Procedure Laterality Date  . AMPUTATION Left 06/05/2019   Procedure: LEFT GREAT TOE AMPUTATION;   Surgeon: Newt Minion, MD;  Location: [place];  Service: Orthopedics;  Laterality: Left;  . AMPUTATION Left 07/10/2019   Procedure: LEFT FOOT 1ST RAY AMPUTATION;  Surgeon: Newt Minion, MD;  Location: [place];  Service: Orthopedics;  Laterality: Left;  . NO PAST SURGERIES     Social History   Occupational History  . Not on file  Tobacco Use  . Smoking status: Current Every Day Smoker    Types: Cigars  . Smokeless tobacco: Former Systems developer    Types: Chew  . Tobacco comment: 2 daily  Substance and Sexual Activity  . Alcohol use: Yes    Comment: occasional  . Drug use: Yes    Types: Marijuana  . Sexual activity: Not on file

## 2019-10-05 ENCOUNTER — Ambulatory Visit: Payer: Self-pay | Admitting: Orthopedic Surgery

## 2019-10-08 ENCOUNTER — Ambulatory Visit (INDEPENDENT_AMBULATORY_CARE_PROVIDER_SITE_OTHER): Payer: Self-pay | Admitting: Orthopedic Surgery

## 2019-10-08 ENCOUNTER — Encounter: Payer: Self-pay | Admitting: Orthopedic Surgery

## 2019-10-08 ENCOUNTER — Other Ambulatory Visit: Payer: Self-pay

## 2019-10-08 ENCOUNTER — Telehealth: Payer: Self-pay | Admitting: Physician Assistant

## 2019-10-08 VITALS — Ht 75.0 in | Wt 220.0 lb

## 2019-10-08 DIAGNOSIS — T8781 Dehiscence of amputation stump: Secondary | ICD-10-CM

## 2019-10-08 MED ORDER — OXYCODONE-ACETAMINOPHEN 5-325 MG PO TABS: 1.0000 | ORAL_TABLET | Freq: Three times a day (TID) | ORAL | 0 refills | Status: DC | PRN

## 2019-10-08 NOTE — Telephone Encounter (Signed)
Patient called asked if the antibiotic was called into the pharmacy yet? The number to contact patient is [phone]

## 2019-10-08 NOTE — Telephone Encounter (Signed)
Mary Anne please advise, thank you. 

## 2019-10-08 NOTE — Addendum Note (Signed)
Addended by: Georgette Dover on: 10/08/2019 09:49 AM   Modules accepted: Orders

## 2019-10-08 NOTE — Progress Notes (Signed)
Office Visit Note   Patient: Scott Greer           Date of Birth: [birth year]           MRN: [id number] Visit Date: 10/08/2019              Requested by: No referring provider defined for this encounter. PCP: Patient, No Pcp Per  Chief Complaint  Patient presents with  . Left Foot - Follow-up    07/10/2019 left foot 1st ray      HPI: The patient presents today for follow-up on his left foot first ray amputation.  He is now 3 months since surgery.  He has had some difficulty with wound dehiscence especially distally.  Apparently he had a very small dehiscence approximately but he states that he wrapped his Ace wrap too tight which caused some swelling it was painful a few days ago with a small amount of drainage but it has resolved he is taking metformin.  He does not know what his blood sugars are he is hopeful to be able to see a primary care provider in the next month or so he denies any fever or chills  Assessment & Plan: Visit Diagnoses: No diagnosis found.  Plan: He will continue to do daily dressing changes.  We will follow up with him in 2 weeks or sooner if anything changes I will give him a refill for his pain medication but he understands that we need to wean down on this  Follow-Up Instructions: No follow-ups on file.   Ortho Exam  Patient is alert, oriented, no adenopathy, well-dressed, normal affect, normal respiratory effort. Left foot:  pulses by Doppler.  Distal wound is 1/2 cm in diameter and 7 cm deep but does not probe to tendon or bone no foul odor.  After informed consent a 10 blade was used to debride the fibrous callus tissue around the distal wound to bleeding edges.  Approximately 1 very small superficial wound dehiscence no surrounding erythema no drainage no foul odor  Imaging: No results found. No images are attached to the encounter.  Labs: Lab Results  Component Value Date   HGBA1C 10.3 (H) 06/04/2019   ESRSEDRATE 78 (H) 06/04/2019   CRP 4.5  (H) 06/04/2019   REPTSTATUS 06/10/2019 FINAL 06/05/2019   CULT  06/05/2019    NO GROWTH 5 DAYS Performed at [facility], [place] [address], [place], [place] [address]      Lab Results  Component Value Date   ALBUMIN 3.4 (L) 06/03/2019   ALBUMIN 3.6 05/14/2019   PREALBUMIN 13.8 (L) 06/04/2019    No results found for: MG No results found for: VD25OH  Lab Results  Component Value Date   PREALBUMIN 13.8 (L) 06/04/2019   CBC EXTENDED Latest Ref Rng & Units 07/10/2019 06/08/2019 06/07/2019  WBC 4.0 - 10.5 K/uL 6.8 6.1 8.7  RBC 4.22 - 5.81 MIL/uL 3.90(L) 3.49(L) 3.85(L)  HGB 13.0 - 17.0 g/dL 11.2(L) 10.1(L) 11.1(L)  HCT 39.0 - 52.0 % 34.7(L) 30.1(L) 32.9(L)  PLT 150 - 400 K/uL 273 242 261  NEUTROABS 1.7 - 7.7 K/uL - - -  LYMPHSABS 0.7 - 4.0 K/uL - - -     Body mass index is 27.5 kg/m.  Orders:  No orders of the defined types were placed in this encounter.  No orders of the defined types were placed in this encounter.    Procedures: No procedures performed  Clinical Data: No additional findings.  ROS:  All other systems negative, except as noted in the HPI. Review of Systems  Objective: Vital Signs: Ht 6\' 3"  (1.905 m)   Wt 220 lb (99.8 kg)   BMI 27.50 kg/m   Specialty Comments:  No specialty comments available.  PMFS History: Patient Active Problem List   Diagnosis Date Noted  . Osteomyelitis (HCC) 06/04/2019  . Diabetic foot infection (HCC)   . Noncompliance with medication regimen   . Glaucoma   . Hyperlipidemia   . Hypertension 11/06/2003  . Diabetes mellitus (HCC) 11/05/1997   Past Medical History:  Diagnosis Date  . Dehiscence of amputation stump (HCC)    left great toe  . Diabetes mellitus without complication (HCC) 1999  . Glaucoma 2015  . Hyperlipidemia   . Hypertension 2005  . Wears glasses     Family History  Problem Relation Age of Onset  . Stroke Mother   . Diabetes Mother        Toward end of life    Past Surgical History:   Procedure Laterality Date  . AMPUTATION Left 06/05/2019   Procedure: LEFT GREAT TOE AMPUTATION;  Surgeon: 06/07/2019, MD;  Location: [facility] OR;  Service: Orthopedics;  Laterality: Left;  . AMPUTATION Left 07/10/2019   Procedure: LEFT FOOT 1ST RAY AMPUTATION;  Surgeon: 09/09/2019, MD;  Location: [facility] OR;  Service: Orthopedics;  Laterality: Left;  . NO PAST SURGERIES     Social History   Occupational History  . Not on file  Tobacco Use  . Smoking status: Current Every Day Smoker    Types: Cigars  . Smokeless tobacco: Former [employer]    Types: Chew  . Tobacco comment: 2 daily  Substance and Sexual Activity  . Alcohol use: Yes    Comment: occasional  . Drug use: Yes    Types: Marijuana  . Sexual activity: Not on file

## 2019-10-09 NOTE — Telephone Encounter (Signed)
Only thing he needed was pain medication and it was called in

## 2019-10-22 ENCOUNTER — Other Ambulatory Visit: Payer: Self-pay

## 2019-10-22 ENCOUNTER — Encounter: Payer: Self-pay | Admitting: Orthopedic Surgery

## 2019-10-22 ENCOUNTER — Ambulatory Visit (INDEPENDENT_AMBULATORY_CARE_PROVIDER_SITE_OTHER): Payer: Self-pay | Admitting: Orthopedic Surgery

## 2019-10-22 VITALS — Ht 75.0 in | Wt 220.0 lb

## 2019-10-22 DIAGNOSIS — T8781 Dehiscence of amputation stump: Secondary | ICD-10-CM

## 2019-10-22 DIAGNOSIS — S98112A Complete traumatic amputation of left great toe, initial encounter: Secondary | ICD-10-CM

## 2019-10-22 DIAGNOSIS — Z89412 Acquired absence of left great toe: Secondary | ICD-10-CM

## 2019-10-22 MED ORDER — OXYCODONE HCL 5 MG PO TABS: 5.0000 mg | ORAL_TABLET | Freq: Four times a day (QID) | ORAL | 0 refills | Status: DC | PRN

## 2019-10-22 NOTE — Progress Notes (Signed)
Office Visit Note   Patient: Scott Greer           Date of Birth: [birth year]           MRN: [id number] Visit Date: 10/22/2019              Requested by: No referring provider defined for this encounter. PCP: Patient, No Pcp Per  Chief Complaint  Patient presents with  . Left Foot - Follow-up    07/10/2019 left foot 1st ray      HPI: Patient is a 63 year old gentleman who presents in follow-up status post left foot first ray amputation.  Patient is 2 months out from surgery.  Patient states he has a small open wound distally.  Patient states he wrapped an Ace wrap on his leg too tight and is been having pain from this.  Patient states he is unable to return to work due to his inability to weight-bear or stand on his foot.  Assessment & Plan: Visit Diagnoses:  1. Amputation of left great toe (HCC)   2. Dehiscence of amputation stump (HCC)     Plan: Patient will be permanently disabled from working due to his inability to stand on the left foot for work.  Prescription for Percocet called in today.  Ulcer debrided recommended discontinuing using the Ace wrap and use a Band-Aid.  Follow-Up Instructions: Return in about 4 weeks (around 11/19/2019).   Ortho Exam  Patient is alert, oriented, no adenopathy, well-dressed, normal affect, normal respiratory effort. Examination patient's foot is plantigrade he has no redness no cellulitis no odor no drainage no signs of infection he does have hypertrophic callus around the ulcer distally.  After informed consent a 10 blade knife was used to debride the skin and soft tissue back to healthy viable bleeding granulation tissue this was touched with silver nitrate.  The ulcer is 15 mm diameter 5 mm deep with good granulation tissue there is no exposed bone or tendon no signs of infection.  Imaging: No results found. No images are attached to the encounter.  Labs: Lab Results  Component Value Date   HGBA1C 10.3 (H) 06/04/2019   ESRSEDRATE  78 (H) 06/04/2019   CRP 4.5 (H) 06/04/2019   REPTSTATUS 06/10/2019 FINAL 06/05/2019   CULT  06/05/2019    NO GROWTH 5 DAYS Performed at [facility], [address], [place], [place] [address]      Lab Results  Component Value Date   ALBUMIN 3.4 (L) 06/03/2019   ALBUMIN 3.6 05/14/2019   PREALBUMIN 13.8 (L) 06/04/2019    No results found for: MG No results found for: VD25OH  Lab Results  Component Value Date   PREALBUMIN 13.8 (L) 06/04/2019   CBC EXTENDED Latest Ref Rng & Units 07/10/2019 06/08/2019 06/07/2019  WBC 4.0 - 10.5 K/uL 6.8 6.1 8.7  RBC 4.22 - 5.81 MIL/uL 3.90(L) 3.49(L) 3.85(L)  HGB 13.0 - 17.0 g/dL 11.2(L) 10.1(L) 11.1(L)  HCT 39.0 - 52.0 % 34.7(L) 30.1(L) 32.9(L)  PLT 150 - 400 K/uL 273 242 261  NEUTROABS 1.7 - 7.7 K/uL - - -  LYMPHSABS 0.7 - 4.0 K/uL - - -     Body mass index is 27.5 kg/m.  Orders:  No orders of the defined types were placed in this encounter.  No orders of the defined types were placed in this encounter.    Procedures: No procedures performed  Clinical Data: No additional findings.  ROS:  All other systems negative, except  as noted in the HPI. Review of Systems  Objective: Vital Signs: Ht 6\' 3"  (1.905 m)   Wt 220 lb (99.8 kg)   BMI 27.50 kg/m   Specialty Comments:  No specialty comments available.  PMFS History: Patient Active Problem List   Diagnosis Date Noted  . Osteomyelitis ([place]) 06/04/2019  . Diabetic foot infection ([place])   . Noncompliance with medication regimen   . Glaucoma   . Hyperlipidemia   . Hypertension 11/06/2003  . Diabetes mellitus ([place]) 11/05/1997   Past Medical History:  Diagnosis Date  . Dehiscence of amputation stump (HCC)    left great toe  . Diabetes mellitus without complication ([place]) 4132  . Glaucoma 2015  . Hyperlipidemia   . Hypertension 2005  . Wears glasses     Family History  Problem Relation Age of Onset  . Stroke Mother   . Diabetes Mother        Toward end of life      Past Surgical History:  Procedure Laterality Date  . AMPUTATION Left 06/05/2019   Procedure: LEFT GREAT TOE AMPUTATION;  Surgeon: Newt Minion, MD;  Location: [place];  Service: Orthopedics;  Laterality: Left;  . AMPUTATION Left 07/10/2019   Procedure: LEFT FOOT 1ST RAY AMPUTATION;  Surgeon: Newt Minion, MD;  Location: [place];  Service: Orthopedics;  Laterality: Left;  . NO PAST SURGERIES     Social History   Occupational History  . Not on file  Tobacco Use  . Smoking status: Current Every Day Smoker    Types: Cigars  . Smokeless tobacco: Former Systems developer    Types: Chew  . Tobacco comment: 2 daily  Substance and Sexual Activity  . Alcohol use: Yes    Comment: occasional  . Drug use: Yes    Types: Marijuana  . Sexual activity: Not on file

## 2019-10-23 ENCOUNTER — Encounter: Payer: Self-pay | Admitting: Internal Medicine

## 2019-10-23 ENCOUNTER — Ambulatory Visit (HOSPITAL_BASED_OUTPATIENT_CLINIC_OR_DEPARTMENT_OTHER): Payer: Self-pay | Admitting: Pharmacist

## 2019-10-23 ENCOUNTER — Ambulatory Visit: Payer: Self-pay | Attending: Internal Medicine | Admitting: Internal Medicine

## 2019-10-23 ENCOUNTER — Telehealth: Payer: Self-pay | Admitting: Orthopedic Surgery

## 2019-10-23 VITALS — BP 162/98 | HR 84 | Resp 16 | Ht 75.0 in | Wt 211.8 lb

## 2019-10-23 DIAGNOSIS — Z89412 Acquired absence of left great toe: Secondary | ICD-10-CM

## 2019-10-23 DIAGNOSIS — Z23 Encounter for immunization: Secondary | ICD-10-CM

## 2019-10-23 DIAGNOSIS — E119 Type 2 diabetes mellitus without complications: Secondary | ICD-10-CM

## 2019-10-23 DIAGNOSIS — I10 Essential (primary) hypertension: Secondary | ICD-10-CM

## 2019-10-23 DIAGNOSIS — S98112A Complete traumatic amputation of left great toe, initial encounter: Secondary | ICD-10-CM

## 2019-10-23 DIAGNOSIS — E1165 Type 2 diabetes mellitus with hyperglycemia: Secondary | ICD-10-CM

## 2019-10-23 DIAGNOSIS — E782 Mixed hyperlipidemia: Secondary | ICD-10-CM

## 2019-10-23 DIAGNOSIS — F17200 Nicotine dependence, unspecified, uncomplicated: Secondary | ICD-10-CM | POA: Insufficient documentation

## 2019-10-23 LAB — POCT GLYCOSYLATED HEMOGLOBIN (HGB A1C): HbA1c, POC (controlled diabetic range): 8.9 % — AB (ref 0.0–7.0)

## 2019-10-23 LAB — GLUCOSE, POCT (MANUAL RESULT ENTRY): POC Glucose: 277 mg/dl — AB (ref 70–99)

## 2019-10-23 MED ORDER — TRUE METRIX METER W/DEVICE KIT: PACK | 0 refills | Status: DC

## 2019-10-23 MED ORDER — ATORVASTATIN CALCIUM 10 MG PO TABS: 10.0000 mg | ORAL_TABLET | Freq: Every day | ORAL | 6 refills | Status: DC

## 2019-10-23 MED ORDER — TRUE METRIX BLOOD GLUCOSE TEST VI STRP: ORAL_STRIP | 12 refills | Status: DC

## 2019-10-23 MED ORDER — METFORMIN HCL 1000 MG PO TABS: 1000.0000 mg | ORAL_TABLET | Freq: Two times a day (BID) | ORAL | 6 refills | Status: DC

## 2019-10-23 MED ORDER — LISINOPRIL 10 MG PO TABS: 10.0000 mg | ORAL_TABLET | Freq: Every day | ORAL | 5 refills | Status: DC

## 2019-10-23 MED ORDER — LISINOPRIL 5 MG PO TABS: 5.0000 mg | ORAL_TABLET | Freq: Every day | ORAL | 4 refills | Status: DC

## 2019-10-23 MED ORDER — TRUEPLUS LANCETS 28G MISC: 4 refills | Status: DC

## 2019-10-23 MED ORDER — AMLODIPINE BESYLATE 5 MG PO TABS: 5.0000 mg | ORAL_TABLET | Freq: Every day | ORAL | 3 refills | Status: DC

## 2019-10-23 MED FILL — TRUEplus LANCETS 28G MISC: 25 days supply | Qty: 100 | Fill #0

## 2019-10-23 MED FILL — ATORVASTATIN 10 MG TABLET: 10 | 30 days supply | Qty: 30 | Fill #0

## 2019-10-23 MED FILL — LISINOPRIL 10 MG TABS: 10 | 30 days supply | Qty: 30 | Fill #0

## 2019-10-23 MED FILL — metFORMIN HCL 1000 MG TABS: 1000 | 30 days supply | Qty: 60 | Fill #0

## 2019-10-23 MED FILL — TRUE METRIX GLUCOSE TEST ST: 25 days supply | Qty: 100 | Fill #0

## 2019-10-23 MED FILL — !TRUE METRIX BLOOD GLUCOSE: 1 days supply | Qty: 1 | Fill #0

## 2019-10-23 NOTE — Patient Instructions (Addendum)
You should be taking Metformin 1000 mg twice a day.  Your blood pressure is not controlled.  We have added the medication lisinopril 10 mg daily.  Try to limit salt in the foods.  For your history of high cholesterol we have started you on a medication called atorvastatin 10 mg daily.  I have sent prescriptions to the pharmacy for diabetic testing supplies.  Pneumococcal Polysaccharide Vaccine (PPSV23): What You Need to Know 1. Why get vaccinated? Pneumococcal polysaccharide vaccine (PPSV23) can prevent pneumococcal disease. Pneumococcal disease refers to any illness caused by pneumococcal bacteria. These bacteria can cause many types of illnesses, including pneumonia, which is an infection of the lungs. Pneumococcal bacteria are one of the most common causes of pneumonia. Besides pneumonia, pneumococcal bacteria can also cause:  Ear infections  Sinus infections  Meningitis (infection of the tissue covering the brain and spinal cord)  Bacteremia (bloodstream infection) Anyone can get pneumococcal disease, but children under 31 years of age, people with certain medical conditions, adults 65 years or older, and cigarette smokers are at the highest risk. Most pneumococcal infections are mild. However, some can result in long-term problems, such as brain damage or hearing loss. Meningitis, bacteremia, and pneumonia caused by pneumococcal disease can be fatal. 2. PPSV23 PPSV23 protects against 23 types of bacteria that cause pneumococcal disease. PPSV23 is recommended for:  All adults 65 years or older,  Anyone 2 years or older with certain medical conditions that can lead to an increased risk for pneumococcal disease. Most people need only one dose of PPSV23. A second dose of PPSV23, and another type of pneumococcal vaccine called PCV13, are recommended for certain high-risk groups. Your health care provider can give you more information. People 65 years or older should get a dose of PPSV23  even if they have already gotten one or more doses of the vaccine before they turned 5. 3. Talk with your health care provider Tell your vaccine provider if the person getting the vaccine:  Has had an allergic reaction after a previous dose of PPSV23, or has any severe, life-threatening allergies. In some cases, your health care provider may decide to postpone PPSV23 vaccination to a future visit. People with minor illnesses, such as a cold, may be vaccinated. People who are moderately or severely ill should usually wait until they recover before getting PPSV23. Your health care provider can give you more information. 4. Risks of a vaccine reaction  Redness or pain where the shot is given, feeling tired, fever, or muscle aches can happen after PPSV23. People sometimes faint after medical procedures, including vaccination. Tell your provider if you feel dizzy or have vision changes or ringing in the ears. As with any medicine, there is a very remote chance of a vaccine causing a severe allergic reaction, other serious injury, or death. 5. What if there is a serious problem? An allergic reaction could occur after the vaccinated person leaves the clinic. If you see signs of a severe allergic reaction (hives, swelling of the face and throat, difficulty breathing, a fast heartbeat, dizziness, or weakness), call 9-1-1 and get the person to the nearest hospital. For other signs that concern you, call your health care provider. Adverse reactions should be reported to the Vaccine Adverse Event Reporting System (VAERS). Your health care provider will usually file this report, or you can do it yourself. Visit the VAERS website at www.vaers.LAgents.no or call 938 423 7268. VAERS is only for reporting reactions, and VAERS staff do not give medical advice.  6. How can I learn more?  Ask your health care provider.  Call your local or state health department.  Contact the Centers for Disease Control and  Prevention (CDC): ? Call 520-092-7741 (1-800-CDC-INFO) or ? Visit CDC's website at http://hunter.com/ CDC Vaccine Information Statement PPSV23 Vaccine (09/03/2018) This information is not intended to replace advice given to you by your health care provider. Make sure you discuss any questions you have with your health care provider. Document Released: 08/19/2006 Document Revised: 02/10/2019 Document Reviewed: 06/03/2018 Elsevier Patient Education  [place].   Influenza Virus Vaccine injection (Fluarix) What is this medicine? INFLUENZA VIRUS VACCINE (in floo EN zuh VAHY ruhs vak SEEN) helps to reduce the risk of getting influenza also known as the flu. This medicine may be used for other purposes; ask your health care provider or pharmacist if you have questions. COMMON BRAND NAME(S): Fluarix, Fluzone What should I tell my health care provider before I take this medicine? They need to know if you have any of these conditions:  bleeding disorder like hemophilia  fever or infection  Guillain-Barre syndrome or other neurological problems  immune system problems  infection with the human immunodeficiency virus (HIV) or AIDS  low blood platelet counts  multiple sclerosis  an unusual or allergic reaction to influenza virus vaccine, eggs, chicken proteins, latex, gentamicin, other medicines, foods, dyes or preservatives  pregnant or trying to get pregnant  breast-feeding How should I use this medicine? This vaccine is for injection into a muscle. It is given by a health care professional. A copy of Vaccine Information Statements will be given before each vaccination. Read this sheet carefully each time. The sheet may change frequently. Talk to your pediatrician regarding the use of this medicine in children. Special care may be needed. Overdosage: If you think you have taken too much of this medicine contact a poison control center or emergency room at once. NOTE: This  medicine is only for you. Do not share this medicine with others. What if I miss a dose? This does not apply. What may interact with this medicine?  chemotherapy or radiation therapy  medicines that lower your immune system like etanercept, anakinra, infliximab, and adalimumab  medicines that treat or prevent blood clots like warfarin  phenytoin  steroid medicines like prednisone or cortisone  theophylline  vaccines This list may not describe all possible interactions. Give your health care provider a list of all the medicines, herbs, non-prescription drugs, or dietary supplements you use. Also tell them if you smoke, drink alcohol, or use illegal drugs. Some items may interact with your medicine. What should I watch for while using this medicine? Report any side effects that do not go away within 3 days to your doctor or health care professional. Call your health care provider if any unusual symptoms occur within 6 weeks of receiving this vaccine. You may still catch the flu, but the illness is not usually as bad. You cannot get the flu from the vaccine. The vaccine will not protect against colds or other illnesses that may cause fever. The vaccine is needed every year. What side effects may I notice from receiving this medicine? Side effects that you should report to your doctor or health care professional as soon as possible:  allergic reactions like skin rash, itching or hives, swelling of the face, lips, or tongue Side effects that usually do not require medical attention (report to your doctor or health care professional if they continue or are bothersome):  fever  headache  muscle aches and pains  pain, tenderness, redness, or swelling at site where injected  weak or tired This list may not describe all possible side effects. Call your doctor for medical advice about side effects. You may report side effects to FDA at 1-800-FDA-1088. Where should I keep my medicine? This  vaccine is only given in a clinic, pharmacy, doctor's office, or other health care setting and will not be stored at home. NOTE: This sheet is a summary. It may not cover all possible information. If you have questions about this medicine, talk to your doctor, pharmacist, or health care provider.  2020 Elsevier/Gold Standard (2008-05-19 09:30:40)

## 2019-10-23 NOTE — Progress Notes (Signed)
[id number]

## 2019-10-23 NOTE — Telephone Encounter (Signed)
Patient came to [facility] stating Dr. Sharol Given would get patient's medical records for disability. Patient asking for Dr. Sharol Given to give him a call. Abigail Butts explained to patient that [employer] company sends off for medical records and a fee is asked of $25.00. Patient stated again that Dr. Sharol Given stated to give him medical record. Patient requesting call back from Dr. Sharol Given. Patient phone number is [phone].

## 2019-10-23 NOTE — Progress Notes (Signed)
Patient presents for vaccination against influenza and strep pneumo per orders of Dr. Johnson. Consent given. Counseling provided. No contraindications exists. Vaccine administered without incident.   

## 2019-10-23 NOTE — Progress Notes (Signed)
Patient ID: Scott Greer, male    DOB: 1956/01/23  MRN: [id number]  CC: New Patient (Initial Visit)   Subjective: Scott Greer is a 63 y.o. male who presents for new pt visit.   His concerns today include:    Pt with hx of HTN, DM type 2, HL, glaucoma LT eye, tob dep, amp LT big toe  Previous PCP was at [facility].  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  He apparently had some dehiscence and needed additional surgery.  He had follow-up with the orthopedics Dr. Sharol Given yesterday.  States he was told things are healing well and that he no longer has to do dressing changes just to put Band-Aid over the open area.   Out of work for 4 mths due to this.  He is to drive dump trucks and work Architect.  He is worried about pain his rent as he does not get unemployment   DM:  Not checking BS lately Just got Metformin 2 wks ago.  Prior to that the was out of it a yr. Taking Metformin once a day instead of BID as prescribed Eat about once a day.  He does not cook.  Some days he has denied [employer].  He does get food stamps.   No numbness in feet No blurred vision.  Wears reading glasses.  Last eye exam 2 yrs ago.  Has glaucoma LT eye.  He has signed up for insurance through the exchange.  He hopes to have insurance come the first of next year.    HTN: Tries to limit salt but he has to eat what he can get.  Lisinopril and amlodipine are on his med list but patient states he has been out of those for months. No CP/SOB/LE  HL: history of HL.  Thinks he was on Liptor  Tob dep:  Smokes 4 cigar a day. Use to smoke cigarettes 1.5 pk/day for 30+ yr, quit 2007. Started smoking cigars 1 yr ago.  Not ready to quit  Past medical, social, family history and surgical history is reviewed Patient Active Problem List   Diagnosis Date Noted  . Amputation of left great toe ([place]) 10/23/2019  . Tobacco dependence 10/23/2019  . Osteomyelitis  ([place]) 06/04/2019  . Diabetic foot infection ([place])   . Noncompliance with medication regimen   . Glaucoma   . Hyperlipidemia   . Hypertension 11/06/2003  . Diabetes mellitus ([place]) 11/05/1997     Current Outpatient Medications on File Prior to Visit  Medication Sig Dispense Refill  . acetaminophen (TYLENOL) 325 MG tablet Take 2 tablets (650 mg total) by mouth every 6 (six) hours as needed for mild pain (or Fever >/= 101). 30 tablet 0  . omega-3 acid ethyl esters (LOVAZA) 1 g capsule Take 2 g by mouth daily.    Marland Kitchen oxyCODONE (OXY IR/ROXICODONE) 5 MG immediate release tablet Take 1 tablet (5 mg total) by mouth every 6 (six) hours as needed for severe pain. 20 tablet 0  . oxyCODONE-acetaminophen (PERCOCET) 5-325 MG tablet Take 1 tablet by mouth every 8 (eight) hours as needed for moderate pain or severe pain. 21 tablet 0   No current facility-administered medications on file prior to visit.    Allergies  Allergen Reactions  . Bee Venom Hives, Itching and Swelling    Social History   Socioeconomic History  . Marital status: Legally Separated    Spouse  name: Nira Conn  . Number of children: 2  . Years of education: Not on file  . Highest education level: Associate degree: occupational, Hotel manager, or vocational program  Occupational History  . Not on file  Tobacco Use  . Smoking status: Current Every Day Smoker    Types: Cigars  . Smokeless tobacco: Former Systems developer    Types: Chew  . Tobacco comment: 2 daily  Substance and Sexual Activity  . Alcohol use: Yes    Comment: occasional  . Drug use: Yes    Types: Marijuana  . Sexual activity: Not on file  Other Topics Concern  . Not on file  Social History Narrative   Out of prison for 2 months.   Lives at home with his wife.   Social Determinants of Health   Financial Resource Strain:   . Difficulty of Paying Living Expenses: Not on file  Food Insecurity:   . Worried About Charity fundraiser in the Last Year: Not on file  . Ran  Out of Food in the Last Year: Not on file  Transportation Needs:   . Lack of Transportation (Medical): Not on file  . Lack of Transportation (Non-Medical): Not on file  Physical Activity:   . Days of Exercise per Week: Not on file  . Minutes of Exercise per Session: Not on file  Stress:   . Feeling of Stress : Not on file  Social Connections:   . Frequency of Communication with Friends and Family: Not on file  . Frequency of Social Gatherings with Friends and Family: Not on file  . Attends Religious Services: Not on file  . Active Member of Clubs or Organizations: Not on file  . Attends Archivist Meetings: Not on file  . Marital Status: Not on file  Intimate Partner Violence:   . Fear of Current or Ex-Partner: Not on file  . Emotionally Abused: Not on file  . Physically Abused: Not on file  . Sexually Abused: Not on file    Family History  Problem Relation Age of Onset  . Stroke Mother   . Diabetes Mother        Toward end of life    Past Surgical History:  Procedure Laterality Date  . AMPUTATION Left 06/05/2019   Procedure: LEFT GREAT TOE AMPUTATION;  Surgeon: Newt Minion, MD;  Location: [place];  Service: Orthopedics;  Laterality: Left;  . AMPUTATION Left 07/10/2019   Procedure: LEFT FOOT 1ST RAY AMPUTATION;  Surgeon: Newt Minion, MD;  Location: [place];  Service: Orthopedics;  Laterality: Left;  . NO PAST SURGERIES      ROS: Review of Systems Negative except as stated above  PHYSICAL EXAM: BP (!) 162/98   Pulse 84   Resp 16   Ht 6' 3"  (1.905 m)   Wt 211 lb 12.8 oz (96.1 kg)   SpO2 99%   BMI 26.47 kg/m   Wt Readings from Last 3 Encounters:  10/23/19 211 lb 12.8 oz (96.1 kg)  10/22/19 220 lb (99.8 kg)  10/08/19 220 lb (99.8 kg)   Physical Exam  General appearance - alert, well appearing, older African-American male and in no distress Mental status - normal mood, behavior, speech, dress, motor activity, and thought processes Eyes -muddy sclera.   Pink conjunctiva Nose -moderate enlargement of nasal turbinates on the left Mouth - mucous membranes moist, pharynx normal without lesions Neck - supple, no significant adenopathy Chest - clear to auscultation, no wheezes, rales or rhonchi,  symmetric air entry Heart - normal rate, regular rhythm, normal S1, S2, no murmurs, rubs, clicks or gallops Extremities -no lower extremity edema Diabetic Foot Exam - Simple   Simple Foot Form Visual Inspection See comments: Yes Sensation Testing Intact to touch and monofilament testing bilaterally: Yes Pulse Check See comments: Yes Comments Dorsalis pedis pulses diminished on the left.  He has had amputation of the left big toe and still has an open area to the surgical wound.  Please see picture below.     Results for orders placed or performed in visit on 10/23/19  Glucose (CBG)  Result Value Ref Range   POC Glucose 277 (A) 70 - 99 mg/dl  HgB A1c  Result Value Ref Range   Hemoglobin A1C     HbA1c POC (<> result, manual entry)     HbA1c, POC (prediabetic range)     HbA1c, POC (controlled diabetic range) 8.9 (A) 0.0 - 7.0 %     CMP Latest Ref Rng & Units 07/10/2019 06/08/2019 06/07/2019  Glucose 70 - 99 mg/dL 88 166(H) 147(H)  BUN 6 - 20 mg/dL 13 19 13   Creatinine 0.61 - 1.24 mg/dL 1.28(H) 1.13 1.24  Sodium 135 - 145 mmol/L 137 133(L) 132(L)  Potassium 3.5 - 5.1 mmol/L 4.4 4.0 3.7  Chloride 98 - 111 mmol/L 104 97(L) 98  CO2 22 - 32 mmol/L 23 27 22   Calcium 8.9 - 10.3 mg/dL 9.2 8.5(L) 8.6(L)  Total Protein 6.5 - 8.1 g/dL - - -  Total Bilirubin 0.3 - 1.2 mg/dL - - -  Alkaline Phos 38 - 126 U/L - - -  AST 15 - 41 U/L - - -  ALT 0 - 44 U/L - - -   Lipid Panel  No results found for: CHOL, TRIG, HDL, CHOLHDL, VLDL, LDLCALC, LDLDIRECT  CBC    Component Value Date/Time   WBC 6.8 07/10/2019 1121   RBC 3.90 (L) 07/10/2019 1121   HGB 11.2 (L) 07/10/2019 1121   HCT 34.7 (L) 07/10/2019 1121   PLT 273 07/10/2019 1121   MCV 89.0 07/10/2019  1121   MCH 28.7 07/10/2019 1121   MCHC 32.3 07/10/2019 1121   RDW 15.4 07/10/2019 1121   LYMPHSABS 1.2 06/03/2019 2158   MONOABS 0.9 06/03/2019 2158   EOSABS 0.1 06/03/2019 2158   BASOSABS 0.0 06/03/2019 2158    ASSESSMENT AND PLAN: 1. Type 2 diabetes mellitus without complication, without long-term current use of insulin (HCC) Not at goal.  Discussed the importance of healthy eating habits, regular exercise and compliance with medication and helping to control diabetes and prevent complications.  His eating habits are not what he would like them to been due to limited finances. Advised him to take the Metformin twice a day as prescribed instead of once a day.  He denies any side effects from the medication.  Prescription given for diabetic testing supplies - Glucose (CBG) - HgB A1c - Microalbumin/Creatinine Ratio, Urine - metFORMIN (GLUCOPHAGE) 1000 MG tablet; Take 1 tablet (1,000 mg total) by mouth 2 (two) times daily with a meal.  Dispense: 60 tablet; Refill: 6 - glucose blood (TRUE METRIX BLOOD GLUCOSE TEST) test strip; Use as instructed  Dispense: 100 each; Refill: 12 - Blood Glucose Monitoring Suppl (TRUE METRIX METER) w/Device KIT; Use as directed  Dispense: 1 kit; Refill: 0 - TRUEplus Lancets 28G MISC; Use as directed  Dispense: 100 each; Refill: 4  2. Essential hypertension Not at goal.  I will start him on lisinopril 10  mg only.  Advised to try to limit salt in the foods as much as possible - lisinopril (ZESTRIL) 10 MG tablet; Take 1 tablet (10 mg total) by mouth daily.  Dispense: 30 tablet; Refill: 5 - Comprehensive metabolic panel  3. Tobacco dependence Advised to quit.  Discussed health risks associated with smoking.  Patient not ready to give a trial of quitting.  4. Mixed hyperlipidemia - atorvastatin (LIPITOR) 10 MG tablet; Take 1 tablet (10 mg total) by mouth daily.  Dispense: 30 tablet; Refill: 6 - Lipid panel  5. Need for influenza vaccination Given  6. Need  for vaccination against Streptococcus pneumoniae Given  7.  Amputation of left big toe Patient given some 4 x 4 gauze and tape.  Advised him to do dressing changes at least once a day.  Continue to follow-up with Ortho  Patient was given the opportunity to ask questions.  Patient verbalized understanding of the plan and was able to repeat key elements of the plan.   Orders Placed This Encounter  Procedures  . Microalbumin/Creatinine Ratio, Urine  . Lipid panel  . Comprehensive metabolic panel  . Glucose (CBG)  . HgB A1c     Requested Prescriptions   Signed Prescriptions Disp Refills  . metFORMIN (GLUCOPHAGE) 1000 MG tablet 60 tablet 6    Sig: Take 1 tablet (1,000 mg total) by mouth 2 (two) times daily with a meal.  . lisinopril (ZESTRIL) 10 MG tablet 30 tablet 5    Sig: Take 1 tablet (10 mg total) by mouth daily.  Marland Kitchen atorvastatin (LIPITOR) 10 MG tablet 30 tablet 6    Sig: Take 1 tablet (10 mg total) by mouth daily.  Marland Kitchen glucose blood (TRUE METRIX BLOOD GLUCOSE TEST) test strip 100 each 12    Sig: Use as instructed  . Blood Glucose Monitoring Suppl (TRUE METRIX METER) w/Device KIT 1 kit 0    Sig: Use as directed  . TRUEplus Lancets 28G MISC 100 each 4    Sig: Use as directed    Return in about 7 years (around 10/22/2026).  Karle Plumber, MD, FACP

## 2019-10-24 ENCOUNTER — Encounter: Payer: Self-pay | Admitting: Internal Medicine

## 2019-10-24 DIAGNOSIS — R809 Proteinuria, unspecified: Secondary | ICD-10-CM | POA: Insufficient documentation

## 2019-10-24 LAB — LIPID PANEL
Chol/HDL Ratio: 2.7 ratio (ref 0.0–5.0)
Cholesterol, Total: 177 mg/dL (ref 100–199)
HDL: 65 mg/dL (ref >39)
LDL Chol Calc (NIH): 85 mg/dL (ref 0–99)
Triglycerides: 157 mg/dL — ABNORMAL HIGH (ref 0–149)
VLDL Cholesterol Cal: 27 mg/dL (ref 5–40)

## 2019-10-24 LAB — COMPREHENSIVE METABOLIC PANEL
ALT: 10 IU/L (ref 0–44)
AST: 8 IU/L (ref 0–40)
Albumin/Globulin Ratio: 1.5 (ref 1.2–2.2)
Albumin: 3.8 g/dL (ref 3.8–4.8)
Alkaline Phosphatase: 77 IU/L (ref 39–117)
BUN/Creatinine Ratio: 17 (ref 10–24)
BUN: 21 mg/dL (ref 8–27)
Bilirubin Total: 0.3 mg/dL (ref 0.0–1.2)
CO2: 24 mmol/L (ref 20–29)
Calcium: 9.9 mg/dL (ref 8.6–10.2)
Chloride: 98 mmol/L (ref 96–106)
Creatinine, Ser: 1.26 mg/dL (ref 0.76–1.27)
GFR calc Af Amer: 71 mL/min/{1.73_m2} (ref >59)
GFR calc non Af Amer: 61 mL/min/{1.73_m2} (ref >59)
Globulin, Total: 2.5 g/dL (ref 1.5–4.5)
Glucose: 298 mg/dL — ABNORMAL HIGH (ref 65–99)
Potassium: 4.9 mmol/L (ref 3.5–5.2)
Sodium: 137 mmol/L (ref 134–144)
Total Protein: 6.3 g/dL (ref 6.0–8.5)

## 2019-10-24 LAB — MICROALBUMIN / CREATININE URINE RATIO
Creatinine, Urine: 145.2 mg/dL
Microalb/Creat Ratio: 2997 mg/g creat — ABNORMAL HIGH (ref 0–29)
Microalbumin, Urine: 4351.9 ug/mL

## 2019-10-24 NOTE — Progress Notes (Signed)
Let patient know that his kidney function is not 100% but improved from 3 months ago.  He has a large amount of protein in the urine which likely indicates that diabetes is affecting the kidneys.  Good blood pressure and diabetes control is important.  Also the blood pressure medication that I prescribed called Lisinopril will help to protect the kidneys from the effects of diabetes.  His LDL cholesterol is 85 with the goal being less than 70.  Please take the cholesterol lowering medication called Atorvastatin as prescribed.

## 2019-10-26 ENCOUNTER — Telehealth: Payer: Self-pay

## 2019-10-26 NOTE — Telephone Encounter (Signed)
I called and spoke to patient and I am not understanding what he is needing. Will hold message for further clarification with Dr Sharol Given.

## 2019-10-26 NOTE — Telephone Encounter (Signed)
Contacted pt to go over lab results pt didn't answer and was unable to lvm due to vm being full  

## 2019-10-26 NOTE — Telephone Encounter (Signed)
I have stated in patient's last office visit note that he is disabled.  Please give patient a copy of his office note that he can use for his disability.

## 2019-10-26 NOTE — Telephone Encounter (Signed)
Dr Sharol Given, please advise. I spoke to patient but I am confused on what you advised him. He says you are going to get his paperwork for disability and medical records. I told him that he is responsible for getting all his paperwork and we will fill out and fax in. Can you please clarify for me? Thanks.

## 2019-10-28 NOTE — Telephone Encounter (Signed)
Called pt to advise that copy of last office visit is at the front desk for pick up. LM ON VM to call with any questions.

## 2019-11-19 ENCOUNTER — Other Ambulatory Visit: Payer: Self-pay

## 2019-11-19 ENCOUNTER — Ambulatory Visit (INDEPENDENT_AMBULATORY_CARE_PROVIDER_SITE_OTHER): Payer: Self-pay | Admitting: Orthopedic Surgery

## 2019-11-19 ENCOUNTER — Encounter: Payer: Self-pay | Admitting: Orthopedic Surgery

## 2019-11-19 VITALS — Ht 75.0 in | Wt 211.8 lb

## 2019-11-19 DIAGNOSIS — Z89412 Acquired absence of left great toe: Secondary | ICD-10-CM

## 2019-11-19 DIAGNOSIS — S98112A Complete traumatic amputation of left great toe, initial encounter: Secondary | ICD-10-CM

## 2019-11-19 NOTE — Progress Notes (Signed)
Office Visit Note   Patient: Scott Greer           Date of Birth: 1956-05-24           MRN: [id number] Visit Date: 11/19/2019              Requested by: No referring provider defined for this encounter. PCP: [name], MD  Chief Complaint  Patient presents with  . Left Foot - Follow-up      HPI: This is a pleasant gentleman who is status post first ray amputation 4 months ago he is doing better.  He is not really having any drainage he is wearing a regular shoe today  Assessment & Plan: Visit Diagnoses: No diagnosis found.  Plan: He will continue to try to keep his diabetes under good control.  He is out of pain medication and has not taken it but is asking for more I discussed with him that I would prefer that he use Tylenol or ibuprofen at this point he will follow-up in 1 month  Follow-Up Instructions: No follow-ups on file.   Ortho Exam  Patient is alert, oriented, no adenopathy, well-dressed, normal affect, normal respiratory effort. Focused examination the wound is healed he does have at the proximal and distal end of the wound some thickened eschar scab.  There is no surrounding erythema swelling or foul odor.  Imaging: No results found. No images are attached to the encounter.  Labs: Lab Results  Component Value Date   HGBA1C 8.9 (A) 10/23/2019   HGBA1C 10.3 (H) 06/04/2019   ESRSEDRATE 78 (H) 06/04/2019   CRP 4.5 (H) 06/04/2019   REPTSTATUS 06/10/2019 FINAL 06/05/2019   CULT  06/05/2019    NO GROWTH 5 DAYS Performed at [facility], [place] [name], [place], [place] [address]      Lab Results  Component Value Date   ALBUMIN 3.8 10/23/2019   ALBUMIN 3.4 (L) 06/03/2019   ALBUMIN 3.6 05/14/2019   PREALBUMIN 13.8 (L) 06/04/2019    No results found for: MG No results found for: VD25OH  Lab Results  Component Value Date   PREALBUMIN 13.8 (L) 06/04/2019   CBC EXTENDED Latest Ref Rng & Units 07/10/2019 06/08/2019 06/07/2019  WBC 4.0 -  10.5 K/uL 6.8 6.1 8.7  RBC 4.22 - 5.81 MIL/uL 3.90(L) 3.49(L) 3.85(L)  HGB 13.0 - 17.0 g/dL 11.2(L) 10.1(L) 11.1(L)  HCT 39.0 - 52.0 % 34.7(L) 30.1(L) 32.9(L)  PLT 150 - 400 K/uL 273 242 261  NEUTROABS 1.7 - 7.7 K/uL - - -  LYMPHSABS 0.7 - 4.0 K/uL - - -     Body mass index is 26.47 kg/m.  Orders:  No orders of the defined types were placed in this encounter.  No orders of the defined types were placed in this encounter.    Procedures: No procedures performed  Clinical Data: No additional findings.  ROS:  All other systems negative, except as noted in the HPI. Review of Systems  Objective: Vital Signs: Ht 6\' 3"  (1.905 m)   Wt 211 lb 12.8 oz (96.1 kg)   BMI 26.47 kg/m   Specialty Comments:  No specialty comments available.  PMFS History: Patient Active Problem List   Diagnosis Date Noted  . Positive for macroalbuminuria 10/24/2019  . Amputation of left great toe ([place]) 10/23/2019  . Tobacco dependence 10/23/2019  . Osteomyelitis ([place]) 06/04/2019  . Diabetic foot infection ([place])   . Noncompliance with medication regimen   . Glaucoma   .  Hyperlipidemia   . Hypertension 11/06/2003  . Diabetes mellitus (HCC) 11/05/1997   Past Medical History:  Diagnosis Date  . Dehiscence of amputation stump (HCC)    left great toe  . Diabetes mellitus without complication (HCC) 1999  . Glaucoma 2015  . Hyperlipidemia   . Hypertension 2005  . Wears glasses     Family History  Problem Relation Age of Onset  . Stroke Mother   . Diabetes Mother        Toward end of life    Past Surgical History:  Procedure Laterality Date  . AMPUTATION Left 06/05/2019   Procedure: LEFT GREAT TOE AMPUTATION;  Surgeon: [name], MD;  [facility];  Service: Orthopedics;  Laterality: Left;  . AMPUTATION Left 07/10/2019   Procedure: LEFT FOOT 1ST RAY AMPUTATION;  Surgeon: [name], MD;  Location: [facility] OR;  Service: Orthopedics;  Laterality: Left;  . NO PAST SURGERIES      Social History   Occupational History  . Not on file  Tobacco Use  . Smoking status: Current Every Day Smoker    Types: Cigars  . Smokeless tobacco: Former Neurosurgeon    Types: Chew  . Tobacco comment: 2 daily  Substance and Sexual Activity  . Alcohol use: Yes    Comment: occasional  . Drug use: Yes    Types: Marijuana  . Sexual activity: Not on file

## 2019-12-17 ENCOUNTER — Ambulatory Visit (INDEPENDENT_AMBULATORY_CARE_PROVIDER_SITE_OTHER): Payer: Self-pay | Admitting: Physician Assistant

## 2019-12-17 ENCOUNTER — Encounter: Payer: Self-pay | Admitting: Physician Assistant

## 2019-12-17 VITALS — Ht 75.0 in | Wt 211.0 lb

## 2019-12-17 DIAGNOSIS — M86272 Subacute osteomyelitis, left ankle and foot: Secondary | ICD-10-CM

## 2019-12-17 NOTE — Progress Notes (Signed)
Office Visit Note   Patient: Scott Greer           Date of Birth: 11/17/1955           MRN: [id number] Visit Date: 12/17/2019              Requested by: [name], MD [address] [place],  [place] [address] PCP: [name], MD  Chief Complaint  Patient presents with  . Left Foot - Follow-up    07/2019 left foot 1st ray amputation       HPI: Patient is 5 months s/p Left foot Ray Amputation. He is doing well.  He does have some difficulty with balance secondary to the amputation.  He is trying to adjust to this.  He does not feel he can afford a custom orthotic right now  Assessment & Plan: Visit Diagnoses: No diagnosis found.  Plan: He may follow-up as needed.  I stressed the importance of wearing appropriate footwear and certainly he would do better with an orthotic.  He is to make sure that his skin taste in good condition and that he tries to control his diabetes  Follow-Up Instructions: No follow-ups on file.   Ortho Exam  Patient is alert, oriented, no adenopathy, well-dressed, normal affect, normal respiratory effort. Focused examination of the left foot demonstrates well-healed surgical incision.  No swelling no surrounding cellulitis no drainage.  No fluctuance no callusing or ulcers  Imaging: No results found. No images are attached to the encounter.  Labs: Lab Results  Component Value Date   HGBA1C 8.9 (A) 10/23/2019   HGBA1C 10.3 (H) 06/04/2019   ESRSEDRATE 78 (H) 06/04/2019   CRP 4.5 (H) 06/04/2019   REPTSTATUS 06/10/2019 FINAL 06/05/2019   CULT  06/05/2019    NO GROWTH 5 DAYS Performed at [facility], [address], [place], [place] [address]      Lab Results  Component Value Date   ALBUMIN 3.8 10/23/2019   ALBUMIN 3.4 (L) 06/03/2019   ALBUMIN 3.6 05/14/2019   PREALBUMIN 13.8 (L) 06/04/2019    No results found for: MG No results found for: VD25OH  Lab Results  Component Value Date   PREALBUMIN 13.8 (L)  06/04/2019   CBC EXTENDED Latest Ref Rng & Units 07/10/2019 06/08/2019 06/07/2019  WBC 4.0 - 10.5 K/uL 6.8 6.1 8.7  RBC 4.22 - 5.81 MIL/uL 3.90(L) 3.49(L) 3.85(L)  HGB 13.0 - 17.0 g/dL 11.2(L) 10.1(L) 11.1(L)  HCT 39.0 - 52.0 % 34.7(L) 30.1(L) 32.9(L)  PLT 150 - 400 K/uL 273 242 261  NEUTROABS 1.7 - 7.7 K/uL - - -  LYMPHSABS 0.7 - 4.0 K/uL - - -     Body mass index is 26.37 kg/m.  Orders:  No orders of the defined types were placed in this encounter.  No orders of the defined types were placed in this encounter.    Procedures: No procedures performed  Clinical Data: No additional findings.  ROS:  All other systems negative, except as noted in the HPI. Review of Systems  Objective: Vital Signs: Ht 6\' 3"  (1.905 m)   Wt 211 lb (95.7 kg)   BMI 26.37 kg/m   Specialty Comments:  No specialty comments available.  PMFS History: Patient Active Problem List   Diagnosis Date Noted  . Positive for macroalbuminuria 10/24/2019  . Amputation of left great toe (HCC) 10/23/2019  . Tobacco dependence 10/23/2019  . Osteomyelitis (HCC) 06/04/2019  . Diabetic foot infection (HCC)   .  Noncompliance with medication regimen   . Glaucoma   . Hyperlipidemia   . Hypertension 11/06/2003  . Diabetes mellitus ([place]) 11/05/1997   Past Medical History:  Diagnosis Date  . Dehiscence of amputation stump (HCC)    left great toe  . Diabetes mellitus without complication ([place]) 8416  . Glaucoma 2015  . Hyperlipidemia   . Hypertension 2005  . Wears glasses     Family History  Problem Relation Age of Onset  . Stroke Mother   . Diabetes Mother        Toward end of life    Past Surgical History:  Procedure Laterality Date  . AMPUTATION Left 06/05/2019   Procedure: LEFT GREAT TOE AMPUTATION;  Surgeon: Newt Minion, MD;  Location: [place];  Service: Orthopedics;  Laterality: Left;  . AMPUTATION Left 07/10/2019   Procedure: LEFT FOOT 1ST RAY AMPUTATION;  Surgeon: Newt Minion, MD;  Location:  [place];  Service: Orthopedics;  Laterality: Left;  . NO PAST SURGERIES     Social History   Occupational History  . Not on file  Tobacco Use  . Smoking status: Current Every Day Smoker    Types: Cigars  . Smokeless tobacco: Former Systems developer    Types: Chew  . Tobacco comment: 2 daily  Substance and Sexual Activity  . Alcohol use: Yes    Comment: occasional  . Drug use: Yes    Types: Marijuana  . Sexual activity: Not on file

## 2020-05-02 ENCOUNTER — Telehealth: Payer: Self-pay | Admitting: Orthopedic Surgery

## 2020-05-02 NOTE — Telephone Encounter (Signed)
Patient called.   Requesting a call back to discuss some of the aftermath of his surgery and establish what is and is not normal.  Call back: [phone]

## 2020-05-03 ENCOUNTER — Telehealth: Payer: Self-pay | Admitting: Orthopedic Surgery

## 2020-05-03 NOTE — Telephone Encounter (Signed)
Can you please call and make an appt? Surgery was 07/2019 and last office visit was 12/17/19 would need to come in for eval if having problems. Thanks!

## 2020-05-03 NOTE — Telephone Encounter (Signed)
Per Dr. Lajoyce Corners can not write a note to excuse pt out of work for all this time but can write a note that states that pt should not work standing on his feet all day. Will notify patient.

## 2020-05-03 NOTE — Telephone Encounter (Signed)
Pt needs a note for work stating that Dr.Duda took him out of work after his surgery on 06/05/19 due to Covid and never sent him back.  [phone]

## 2020-05-03 NOTE — Telephone Encounter (Signed)
Called patient left message to return call to schedule an appointment to be seen in the office.

## 2020-05-04 ENCOUNTER — Telehealth: Payer: Self-pay | Admitting: Orthopedic Surgery

## 2020-05-04 NOTE — Telephone Encounter (Signed)
Scott Greer sw pt to advise of below and if he is having any other problems he can make an appt to discuss with Dr. Lajoyce Corners.

## 2020-05-04 NOTE — Telephone Encounter (Signed)
Patient called concerning needing note from Dr Lajoyce Corners. I read message to patient from Dr Lajoyce Corners. Patient is scheduled for an appointment 05/05/2020 at 2:00pm. The number to contact patient if needed is [phone]

## 2020-05-05 ENCOUNTER — Ambulatory Visit: Payer: Self-pay | Admitting: Physician Assistant

## 2020-05-05 ENCOUNTER — Telehealth: Payer: Self-pay | Admitting: Orthopedic Surgery

## 2020-05-05 NOTE — Telephone Encounter (Signed)
Patient called asking for doctor's note to say under Dr. Lajoyce Corners instructions for him to stay out of work due to [employer]. Patient states he has appt today at 2pm but his car broke down. Patient states letter is for agency to help him with his rent and why he is unable to work due to his feet problems. Please give patient a call back concerning this matter. Patient phone number is [phone].

## 2020-05-05 NOTE — Telephone Encounter (Signed)
I called pt and tried to explain multiple times that we can write a note stating that he was out of work from the time of surgery until his last office visit 12/17/19 but that we can not say that he has not been able to work from 12/17/19 to date. Pt states that it was because of covid and that he was not allowed to be around people because of that. I advised he can try his PCP but from an orthopedic stand point we can not say that we advised him to remain out of work. We can say that he would be limited weight bearing due to orthopedic issues but not that he was unable to work. Pt not satisfied with this answer. I advised that we are always happy to see him in the office if he is having any problems at all. He does not want to come in the office.

## 2020-05-20 ENCOUNTER — Other Ambulatory Visit: Payer: Self-pay | Admitting: Orthopedic Surgery

## 2020-05-20 ENCOUNTER — Telehealth: Payer: Self-pay | Admitting: Orthopedic Surgery

## 2020-05-20 MED ORDER — DOXYCYCLINE HYCLATE 100 MG PO TABS: 100.0000 mg | ORAL_TABLET | Freq: Two times a day (BID) | ORAL | 0 refills | Status: DC

## 2020-05-20 MED FILL — ?DOXYCYCLINE HYCLATE 100MG: 100 | 10 days supply | Qty: 20 | Fill #0

## 2020-05-20 NOTE — Telephone Encounter (Signed)
Pls advise. Thanks.  

## 2020-05-20 NOTE — Telephone Encounter (Signed)
Patient called stating that he has an infection in his right foot. Patient asked if he can get a antibiotic sent to [facility]. The number to contact patient is [phone]

## 2020-05-20 NOTE — Telephone Encounter (Signed)
Rx sent, he needs for [place] to see him next week

## 2020-05-20 NOTE — Telephone Encounter (Signed)
Scheduled for Monday at 10am

## 2020-05-23 ENCOUNTER — Other Ambulatory Visit: Payer: Self-pay

## 2020-05-23 ENCOUNTER — Other Ambulatory Visit (HOSPITAL_COMMUNITY): Payer: Self-pay

## 2020-05-23 ENCOUNTER — Ambulatory Visit (INDEPENDENT_AMBULATORY_CARE_PROVIDER_SITE_OTHER): Payer: 59

## 2020-05-23 ENCOUNTER — Other Ambulatory Visit: Payer: Self-pay | Admitting: Physician Assistant

## 2020-05-23 ENCOUNTER — Ambulatory Visit: Payer: Self-pay

## 2020-05-23 ENCOUNTER — Ambulatory Visit (INDEPENDENT_AMBULATORY_CARE_PROVIDER_SITE_OTHER): Payer: 59 | Admitting: Orthopedic Surgery

## 2020-05-23 ENCOUNTER — Encounter: Payer: Self-pay | Admitting: Orthopedic Surgery

## 2020-05-23 VITALS — Ht 75.0 in

## 2020-05-23 DIAGNOSIS — M79671 Pain in right foot: Secondary | ICD-10-CM

## 2020-05-23 DIAGNOSIS — M869 Osteomyelitis, unspecified: Secondary | ICD-10-CM | POA: Diagnosis not present

## 2020-05-23 DIAGNOSIS — M79672 Pain in left foot: Secondary | ICD-10-CM | POA: Diagnosis not present

## 2020-05-23 MED ORDER — OXYCODONE-ACETAMINOPHEN 5-325 MG PO TABS: 1.0000 | ORAL_TABLET | ORAL | 0 refills | Status: DC | PRN

## 2020-05-23 NOTE — Progress Notes (Signed)
Office Visit Note   Patient: Scott Greer           Date of Birth: 09/04/1956           MRN: [id number] Visit Date: 05/23/2020              Requested by: [name], MD [address] [place],  [place] [address] PCP: [name], MD  Chief Complaint  Patient presents with  . Right Foot - Pain  . Left Foot - Pain    1st ray amputation 07/10/2019      HPI: Patient is a 64 year old gentleman who presents with bilateral foot pain.  He is status post a left first ray amputation in September 2020.  Patient states his car recently has broken down and he has had to walk a lot for the past 2 weeks.  Patient states he has had progressive ulceration of pain of the right great toe.  Assessment & Plan: Visit Diagnoses:  1. Bilateral foot pain   2. Osteomyelitis of great toe of right foot (HCC)     Plan: With the necrotic ulcer of the right great toe exposed bone with destructive bony changes discussed that at a minimum we will need to proceed with an amputation of the right great toe at the MTP joint.  Risks and benefits were discussed including risk of the wound not healing need for higher level amputation.  Patient states he understands wished to proceed.  Prescription for Percocet is called and he will continue with his oral antibiotics.  Follow-Up Instructions: Return in about 1 week (around 05/30/2020).   Ortho Exam  Patient is alert, oriented, no adenopathy, well-dressed, normal affect, normal respiratory effort. Examination of the left foot there is no swelling no cellulitis the first ray amputation is well-healed.  Examination of the right foot he has a full-thickness gangrenous ulcer of the tip of the right great toe there is exposed bone of the tuft of the toe with destructive bony changes consistent with osteomyelitis.  A Doppler was used and he has a biphasic dorsalis pedis and posterior tibial pulse on the right.  Imaging: XR Foot Complete Left  Result Date:  05/23/2020 Three-view radiographs of the left foot shows previous first ray amputation with calcification of the arteries out to the toes.  No destructive bony changes.  XR Foot Complete Right  Result Date: 05/23/2020 Three-view radiographs of the right foot shows destructive bony changes of the tuft of the great toe with significant peripheral vascular disease with calcification of the arteries in the foot out to the toes.  No images are attached to the encounter.  Labs: Lab Results  Component Value Date   HGBA1C 8.9 (A) 10/23/2019   HGBA1C 10.3 (H) 06/04/2019   ESRSEDRATE 78 (H) 06/04/2019   CRP 4.5 (H) 06/04/2019   REPTSTATUS 06/10/2019 FINAL 06/05/2019   CULT  06/05/2019    NO GROWTH 5 DAYS Performed at [facility], [address] [place], [place] [address]      Lab Results  Component Value Date   ALBUMIN 3.8 10/23/2019   ALBUMIN 3.4 (L) 06/03/2019   ALBUMIN 3.6 05/14/2019   PREALBUMIN 13.8 (L) 06/04/2019    No results found for: MG No results found for: VD25OH  Lab Results  Component Value Date   PREALBUMIN 13.8 (L) 06/04/2019   CBC EXTENDED Latest Ref Rng & Units 07/10/2019 06/08/2019 06/07/2019  WBC 4.0 - 10.5 K/uL 6.8 6.1 8.7  RBC  4.22 - 5.81 MIL/uL 3.90(L) 3.49(L) 3.85(L)  HGB 13.0 - 17.0 g/dL 11.2(L) 10.1(L) 11.1(L)  HCT 39 - 52 % 34.7(L) 30.1(L) 32.9(L)  PLT 150 - 400 K/uL 273 242 261  NEUTROABS 1.7 - 7.7 K/uL - - -  LYMPHSABS 0.7 - 4.0 K/uL - - -     Body mass index is 26.37 kg/m.  Orders:  Orders Placed This Encounter  Procedures  . XR Foot Complete Left  . XR Foot Complete Right   Meds ordered this encounter  Medications  . oxyCODONE-acetaminophen (PERCOCET/ROXICET) 5-325 MG tablet    Sig: Take 1 tablet by mouth every 4 (four) hours as needed for severe pain.    Dispense:  30 tablet    Refill:  0     Procedures: No procedures performed  Clinical Data: No additional findings.  ROS:  All other systems negative, except as noted in  the HPI. Review of Systems  Objective: Vital Signs: Ht 6\' 3"  (1.905 m)   BMI 26.37 kg/m   Specialty Comments:  No specialty comments available.  PMFS History: Patient Active Problem List   Diagnosis Date Noted  . Positive for macroalbuminuria 10/24/2019  . Amputation of left great toe (HCC) 10/23/2019  . Tobacco dependence 10/23/2019  . Osteomyelitis (HCC) 06/04/2019  . Diabetic foot infection (HCC)   . Noncompliance with medication regimen   . Glaucoma   . Hyperlipidemia   . Hypertension 11/06/2003  . Diabetes mellitus (HCC) 11/05/1997   Past Medical History:  Diagnosis Date  . Dehiscence of amputation stump (HCC)    left great toe  . Diabetes mellitus without complication (HCC) 1999  . Glaucoma 2015  . Hyperlipidemia   . Hypertension 2005  . Wears glasses     Family History  Problem Relation Age of Onset  . Stroke Mother   . Diabetes Mother        Toward end of life    Past Surgical History:  Procedure Laterality Date  . AMPUTATION Left 06/05/2019   Procedure: LEFT GREAT TOE AMPUTATION;  Surgeon: 06/07/2019, MD;  Location: [facility] OR;  Service: Orthopedics;  Laterality: Left;  . AMPUTATION Left 07/10/2019   Procedure: LEFT FOOT 1ST RAY AMPUTATION;  Surgeon: 09/09/2019, MD;  Location: [facility] OR;  Service: Orthopedics;  Laterality: Left;  . NO PAST SURGERIES     Social History   Occupational History  . Not on file  Tobacco Use  . Smoking status: Current Every Day Smoker    Types: Cigars  . Smokeless tobacco: Former [facility]    Types: Chew  . Tobacco comment: 2 daily  Vaping Use  . Vaping Use: Never used  Substance and Sexual Activity  . Alcohol use: Yes    Comment: occasional  . Drug use: Yes    Types: Marijuana  . Sexual activity: Not on file

## 2020-05-24 ENCOUNTER — Other Ambulatory Visit: Payer: Self-pay

## 2020-05-24 ENCOUNTER — Encounter (HOSPITAL_COMMUNITY): Payer: Self-pay | Admitting: Orthopedic Surgery

## 2020-05-24 NOTE — Progress Notes (Signed)
Anesthesia Chart Review: SAME DAY WORK-UP   Case: [id number] Date/Time: 05/25/20 1458   Procedure: RIGHT GREAT TOE AMPUTATION (Right )   Anesthesia type: Choice   Pre-op diagnosis: Gangrene, Osteomyelitis Right Great Toe   Location: [facility] OR ROOM 03 / [place] OR   Surgeons: Newt Minion, MD      DISCUSSION: Patient is a 64 year old male scheduled for the above procedure. Patient seen by Dr. Sharol Given on 05/23/20 for bilateral foot pain. His car had broken down, so he has had to walk quite a bit and developed a painful ulcer on his right great toe. By notes, he has osteomyelitis with a necrotic ulcer and exposed bone, and above surgery recommended. The left foot ray amputation site was well-healed.  History includes smoking, HTN, HLD, DM2, glaucoma, osteomyelitis (s/p left great toe amputation 06/05/19, s/p left foot 1st ray amputation 07/10/19).    He denied chest pain or shortness of breath per PAT RN phone interview.  He has been unemployed since August 2020 and just started getting unemployment 7 months ago.  He is not refilled his diabetes or hypertension medications, reportedly due to finances.  His home CBG was 170 on 05/24/2020.  He was encouraged to follow-up with primary care and get medications refilled.  Reportedly says he plans to get reestablished with [facility].  It appears that he did not go to his COVID-19 testing appointment on 05/23/20. He was instructed to arrive 3 hours early for day of surgery testing. He is a same day work-up, so he will get vitals and labs on arrival. Anesthesia team to evaluate and determine definitive anesthesia plan.    VS: Ht _0  (1.905 m)   Wt 97.5 kg   BMI 26.87 kg/m   BP Readings from Last 3 Encounters:  10/23/19 (!) 162/98  07/12/19 135/79  07/10/19 (!) 160/95   Pulse Readings from Last 3 Encounters:  10/23/19 84  07/12/19 94  07/10/19 74    PROVIDERS: [name], MD is listed as PCP   LABS: He is for  labs on the day of surgery. On 10/23/19 A1c was 8.9%, Cr 1.26. H/H 11.2/34.7 on 07/10/19.    IMAGES: XR Foot Complete Left Result Date: 05/23/2020 Three-view radiographs of the left foot shows previous first ray amputation with calcification of the arteries out to the toes.  No destructive bony changes.  XR Foot Complete Right Result Date: 05/23/2020 Three-view radiographs of the right foot shows destructive bony changes of the tuft of the great toe with significant peripheral vascular disease with calcification of the arteries in the foot out to the toes.   EKG: 07/10/19: Normal sinus rhythm consider Septal infarct , age undetermined Abnormal ECG No previous tracing Confirmed by Camnitz, Will [phone]) on 07/10/2019 2:38:31 PM   CV: N/A   Past Medical History:  Diagnosis Date  . Dehiscence of amputation stump (HCC)    left great toe  . Diabetes mellitus without complication ([place]) 4174   Type II  . Glaucoma 2015  . Hyperlipidemia   . Hypertension 2005  . Osteomyelitis ([place])   . Wears glasses     Past Surgical History:  Procedure Laterality Date  . AMPUTATION Left 06/05/2019   Procedure: LEFT GREAT TOE AMPUTATION;  Surgeon: Newt Minion, MD;  Location: [place];  Service: Orthopedics;  Laterality: Left;  . AMPUTATION Left 07/10/2019   Procedure: LEFT FOOT 1ST RAY AMPUTATION;  Surgeon: Newt Minion, MD;  Location: [facility]  OR;  Service: Orthopedics;  Laterality: Left;  . NO PAST SURGERIES      MEDICATIONS: No current facility-administered medications for this encounter.   . doxycycline (VIBRA-TABS) 100 MG tablet  . atorvastatin (LIPITOR) 10 MG tablet  . Blood Glucose Monitoring Suppl (TRUE METRIX METER) w/Device KIT  . glucose blood (TRUE METRIX BLOOD GLUCOSE TEST) test strip  . lisinopril (ZESTRIL) 10 MG tablet  . metFORMIN (GLUCOPHAGE) 1000 MG tablet  . oxyCODONE-acetaminophen (PERCOCET/ROXICET) 5-325 MG tablet  . TRUEplus Lancets 28G MISC   He is not currently taking  Metformin, lisinopril, atorvastatin.   Myra Gianotti, PA-C Surgical Short Stay/Anesthesiology [facility] Phone [phone] [facility] Phone [phone] 05/24/2020 1:15 PM

## 2020-05-24 NOTE — Anesthesia Preprocedure Evaluation (Addendum)
Anesthesia Evaluation  Patient identified by MRN, date of birth, ID band Patient awake    Reviewed: Allergy & Precautions, NPO status , Patient's Chart, lab work & pertinent test results  Airway Mallampati: II  TM Distance: >3 FB Neck ROM: Full    Dental no notable dental hx. (+) Teeth Intact, Dental Advisory Given   Pulmonary Current Smoker and Patient abstained from smoking.,    Pulmonary exam normal breath sounds clear to auscultation       Cardiovascular Exercise Tolerance: Good hypertension, Pt. on medications Normal cardiovascular exam Rhythm:Regular Rate:Normal     Neuro/Psych negative neurological ROS  negative psych ROS   GI/Hepatic negative GI ROS, Neg liver ROS,   Endo/Other  diabetes, Type 2, Oral Hypoglycemic Agents  Renal/GU negative Renal ROS     Musculoskeletal negative musculoskeletal ROS (+)   Abdominal   Peds  Hematology negative hematology ROS (+)   Anesthesia Other Findings   Reproductive/Obstetrics                           Anesthesia Physical Anesthesia Plan  ASA: III  Anesthesia Plan: Regional   Post-op Pain Management:    Induction:   PONV Risk Score and Plan: 3 and Treatment may vary due to age or medical condition, Midazolam and Ondansetron  Airway Management Planned: Natural Airway and Nasal Cannula  Additional Equipment: None  Intra-op Plan:   Post-operative Plan:   Informed Consent:     Dental advisory given  Plan Discussed with: CRNA and Surgeon  Anesthesia Plan Comments: (PAT note written 05/24/2020 by Shonna Chock, PA-C. Has right great toe osteomyelitis with ulcer/exposed bone. He is not currently taken DM, HTN medications.   R popliteal block )       Anesthesia Quick Evaluation

## 2020-05-24 NOTE — Progress Notes (Addendum)
[name] denies chest pain or shortness of breath. [name] has been out of work since 06/2019 , he just started getting unemployment in January, "I haven't got the back pay yet." [name] has DM and HTN, he doesn't have medications for either. [name] saw a Dr. At [facility] 10/2019, he went back without an appointment and was not seen, he has not been out yet.  [name] had Percocet prescription filled today using an Insurance card that he did not know would work. I asked patient if he thinks if the card would pay for DM and Blood Pressure medications, patient responded "maybe"  [name] will be seeing a Dr. at [facility] , but he does not have an appointment at his time.  I asked [name] to check his CBG, he had to find the machine. CBG was 170. I instructed patient to check CBG after awaking and every 2 hours until arrival  to the hospital.  I Instructed patient if CBG is less than 70 to drink 1/2 cup of a clear juice. Recheck CBG in 15 minutes then call pre- op desk at [phone] for further instructions.  [name] does not have anyone to stay with him tomorrow night after surgery. I left a voice message for Elnita Maxwell at Dr. Audrie Lia office with this information.

## 2020-05-25 ENCOUNTER — Encounter (HOSPITAL_COMMUNITY): Payer: Self-pay | Admitting: Orthopedic Surgery

## 2020-05-25 ENCOUNTER — Ambulatory Visit (HOSPITAL_COMMUNITY): Payer: 59 | Admitting: Vascular Surgery

## 2020-05-25 ENCOUNTER — Encounter (HOSPITAL_COMMUNITY)
Admission: RE | Disposition: A | Discharge status: Home / Self Care | Payer: Self-pay | Source: Ambulatory Visit | Attending: Orthopedic Surgery

## 2020-05-25 ENCOUNTER — Other Ambulatory Visit: Payer: Self-pay | Admitting: Physician Assistant

## 2020-05-25 ENCOUNTER — Other Ambulatory Visit: Payer: Self-pay

## 2020-05-25 ENCOUNTER — Ambulatory Visit (HOSPITAL_COMMUNITY)
Admission: RE | Admit: 2020-05-25 | Discharge: 2020-05-25 | Disposition: A | Discharge status: Home / Self Care | Payer: 59 | Source: Ambulatory Visit | Attending: Orthopedic Surgery | Admitting: Orthopedic Surgery

## 2020-05-25 DIAGNOSIS — F17290 Nicotine dependence, other tobacco product, uncomplicated: Secondary | ICD-10-CM | POA: Insufficient documentation

## 2020-05-25 DIAGNOSIS — M869 Osteomyelitis, unspecified: Secondary | ICD-10-CM | POA: Diagnosis not present

## 2020-05-25 DIAGNOSIS — S98112A Complete traumatic amputation of left great toe, initial encounter: Secondary | ICD-10-CM

## 2020-05-25 DIAGNOSIS — Z20822 Contact with and (suspected) exposure to covid-19: Secondary | ICD-10-CM | POA: Diagnosis not present

## 2020-05-25 DIAGNOSIS — E1169 Type 2 diabetes mellitus with other specified complication: Secondary | ICD-10-CM | POA: Diagnosis not present

## 2020-05-25 DIAGNOSIS — E1152 Type 2 diabetes mellitus with diabetic peripheral angiopathy with gangrene: Secondary | ICD-10-CM | POA: Insufficient documentation

## 2020-05-25 DIAGNOSIS — I96 Gangrene, not elsewhere classified: Secondary | ICD-10-CM | POA: Insufficient documentation

## 2020-05-25 HISTORY — DX: Osteomyelitis, unspecified: M86.9

## 2020-05-25 HISTORY — PX: AMPUTATION: SHX166

## 2020-05-25 LAB — CBC
HCT: 34.5 % — ABNORMAL LOW (ref 39.0–52.0)
Hemoglobin: 11.4 g/dL — ABNORMAL LOW (ref 13.0–17.0)
MCH: 30.6 pg (ref 26.0–34.0)
MCHC: 33 g/dL (ref 30.0–36.0)
MCV: 92.5 fL (ref 80.0–100.0)
Platelets: 366 10*3/uL (ref 150–400)
RBC: 3.73 MIL/uL — ABNORMAL LOW (ref 4.22–5.81)
RDW: 12.8 % (ref 11.5–15.5)
WBC: 10.1 10*3/uL (ref 4.0–10.5)
nRBC: 0 % (ref 0.0–0.2)

## 2020-05-25 LAB — BASIC METABOLIC PANEL
Anion gap: 9 (ref 5–15)
BUN: 14 mg/dL (ref 8–23)
CO2: 28 mmol/L (ref 22–32)
Calcium: 9.4 mg/dL (ref 8.9–10.3)
Chloride: 97 mmol/L — ABNORMAL LOW (ref 98–111)
Creatinine, Ser: 1.28 mg/dL — ABNORMAL HIGH (ref 0.61–1.24)
GFR calc Af Amer: >60 mL/min (ref >60)
GFR calc non Af Amer: >60 mL/min (ref >60)
Glucose, Bld: 146 mg/dL — ABNORMAL HIGH (ref 70–99)
Potassium: 3.9 mmol/L (ref 3.5–5.1)
Sodium: 134 mmol/L — ABNORMAL LOW (ref 135–145)

## 2020-05-25 LAB — GLUCOSE, CAPILLARY
Glucose-Capillary: 156 mg/dL — ABNORMAL HIGH (ref 70–99)
Glucose-Capillary: 157 mg/dL — ABNORMAL HIGH (ref 70–99)

## 2020-05-25 LAB — SARS CORONAVIRUS 2 BY RT PCR (HOSPITAL ORDER, PERFORMED IN ~~LOC~~ HOSPITAL LAB): SARS Coronavirus 2: NEGATIVE

## 2020-05-25 SURGERY — AMPUTATION DIGIT
Anesthesia: Regional | Site: Toe | Laterality: Right

## 2020-05-25 MED ORDER — FENTANYL CITRATE (PF) 100 MCG/2ML IJ SOLN: 50.0000 ug | Freq: Once | INTRAMUSCULAR | Status: AC

## 2020-05-25 MED ORDER — FENTANYL CITRATE (PF) 100 MCG/2ML IJ SOLN: 25.0000 ug | INTRAMUSCULAR | Status: DC | PRN

## 2020-05-25 MED ORDER — PROPOFOL 500 MG/50ML IV EMUL
INTRAVENOUS | Status: DC | PRN
  Administered 2020-05-25: 100 ug/kg/min via INTRAVENOUS

## 2020-05-25 MED ORDER — CEFAZOLIN SODIUM-DEXTROSE 2-4 GM/100ML-% IV SOLN
2.0000 g | INTRAVENOUS | Status: AC
  Administered 2020-05-25: 2 g via INTRAVENOUS

## 2020-05-25 MED ORDER — CHLORHEXIDINE GLUCONATE 0.12 % MT SOLN: 15.0000 mL | Freq: Once | OROMUCOSAL | Status: AC

## 2020-05-25 MED ORDER — MIDAZOLAM HCL 2 MG/2ML IJ SOLN
INTRAMUSCULAR | Status: AC
  Administered 2020-05-25: 2 mg via INTRAVENOUS
  Filled 2020-05-25: qty 2

## 2020-05-25 MED ORDER — ONDANSETRON HCL 4 MG/2ML IJ SOLN: 4.0000 mg | Freq: Once | INTRAMUSCULAR | Status: DC | PRN

## 2020-05-25 MED ORDER — MIDAZOLAM HCL 2 MG/2ML IJ SOLN: 2.0000 mg | Freq: Once | INTRAMUSCULAR | Status: AC

## 2020-05-25 MED ORDER — CEFAZOLIN SODIUM-DEXTROSE 2-4 GM/100ML-% IV SOLN
INTRAVENOUS | Status: AC
  Filled 2020-05-25: qty 100

## 2020-05-25 MED ORDER — ROPIVACAINE HCL 7.5 MG/ML IJ SOLN
INTRAMUSCULAR | Status: DC | PRN
  Administered 2020-05-25: 20 mL via PERINEURAL

## 2020-05-25 MED ORDER — CLONIDINE HCL (ANALGESIA) 100 MCG/ML EP SOLN
EPIDURAL | Status: DC | PRN
  Administered 2020-05-25: 100 ug

## 2020-05-25 MED ORDER — FENTANYL CITRATE (PF) 100 MCG/2ML IJ SOLN
INTRAMUSCULAR | Status: AC
  Administered 2020-05-25: 50 ug via INTRAVENOUS
  Filled 2020-05-25: qty 2

## 2020-05-25 MED ORDER — CHLORHEXIDINE GLUCONATE 0.12 % MT SOLN
OROMUCOSAL | Status: AC
  Administered 2020-05-25: 15 mL via OROMUCOSAL
  Filled 2020-05-25: qty 15

## 2020-05-25 MED ORDER — ACETAMINOPHEN 10 MG/ML IV SOLN: 1000.0000 mg | Freq: Once | INTRAVENOUS | Status: DC | PRN

## 2020-05-25 MED ORDER — ORAL CARE MOUTH RINSE: 15.0000 mL | Freq: Once | OROMUCOSAL | Status: AC

## 2020-05-25 MED ORDER — LACTATED RINGERS IV SOLN: INTRAVENOUS | Status: DC

## 2020-05-25 SURGICAL SUPPLY — 30 items
BLADE SURG 21 STRL SS (BLADE) ×2 IMPLANT
BNDG CMPR 9X4 STRL LF SNTH (GAUZE/BANDAGES/DRESSINGS)
BNDG COHESIVE 4X5 TAN STRL (GAUZE/BANDAGES/DRESSINGS) ×2 IMPLANT
BNDG ESMARK 4X9 LF (GAUZE/BANDAGES/DRESSINGS) IMPLANT
BNDG GAUZE ELAST 4 BULKY (GAUZE/BANDAGES/DRESSINGS) ×2 IMPLANT
COVER SURGICAL LIGHT HANDLE (MISCELLANEOUS) ×4 IMPLANT
COVER WAND RF STERILE (DRAPES) ×2 IMPLANT
DRAPE U-SHAPE 47X51 STRL (DRAPES) ×2 IMPLANT
DRSG ADAPTIC 3X8 NADH LF (GAUZE/BANDAGES/DRESSINGS) IMPLANT
DRSG EMULSION OIL 3X3 NADH (GAUZE/BANDAGES/DRESSINGS) ×2 IMPLANT
DRSG PAD ABDOMINAL 8X10 ST (GAUZE/BANDAGES/DRESSINGS) ×2 IMPLANT
DURAPREP 26ML APPLICATOR (WOUND CARE) ×2 IMPLANT
ELECT REM PT RETURN 9FT ADLT (ELECTROSURGICAL) ×2
ELECTRODE REM PT RTRN 9FT ADLT (ELECTROSURGICAL) ×1 IMPLANT
GAUZE SPONGE 4X4 12PLY STRL (GAUZE/BANDAGES/DRESSINGS) ×2 IMPLANT
GLOVE BIOGEL PI IND STRL 9 (GLOVE) ×1 IMPLANT
GLOVE BIOGEL PI INDICATOR 9 (GLOVE) ×1
GLOVE SURG ORTHO 9.0 STRL STRW (GLOVE) ×2 IMPLANT
GOWN STRL REUS W/ TWL XL LVL3 (GOWN DISPOSABLE) ×2 IMPLANT
GOWN STRL REUS W/TWL XL LVL3 (GOWN DISPOSABLE) ×4
KIT BASIN OR (CUSTOM PROCEDURE TRAY) ×2 IMPLANT
KIT TURNOVER KIT B (KITS) ×2 IMPLANT
MANIFOLD NEPTUNE II (INSTRUMENTS) IMPLANT
NEEDLE 22X1 1/2 (OR ONLY) (NEEDLE) IMPLANT
NS IRRIG 1000ML POUR BTL (IV SOLUTION) ×2 IMPLANT
PACK ORTHO EXTREMITY (CUSTOM PROCEDURE TRAY) ×2 IMPLANT
PAD ARMBOARD 7.5X6 YLW CONV (MISCELLANEOUS) ×4 IMPLANT
SUT ETHILON 2 0 PSLX (SUTURE) ×2 IMPLANT
SYR CONTROL 10ML LL (SYRINGE) IMPLANT
TOWEL GREEN STERILE (TOWEL DISPOSABLE) ×2 IMPLANT

## 2020-05-25 NOTE — Progress Notes (Signed)
Orthopedic Tech Progress Note Patient Details:  Scott Greer 01-25-1956 [id number]  Ortho Devices Type of Ortho Device: Postop shoe/boot Ortho Device/Splint Location: RLE Ortho Device/Splint Interventions: Ordered, Application   Post Interventions Patient Tolerated: Well Instructions Provided: Care of device   [name] A Nicholaus Steinke 05/25/2020, 6:32 PM

## 2020-05-25 NOTE — Anesthesia Procedure Notes (Signed)
Procedure Name: MAC Date/Time: 05/25/2020 3:05 PM Performed by: Oletta Lamas, CRNA Pre-anesthesia Checklist: Patient identified, Emergency Drugs available, Suction available and Patient being monitored Patient Re-evaluated:Patient Re-evaluated prior to induction Oxygen Delivery Method: Simple face mask

## 2020-05-25 NOTE — Anesthesia Procedure Notes (Signed)
Anesthesia Regional Block: Popliteal block   Pre-Anesthetic Checklist: ,, timeout performed, Correct Patient, Correct Site, Correct Laterality, Correct Procedure, Correct Position, site marked, Risks and benefits discussed, pre-op evaluation,  At surgeon's request and post-op pain management  Laterality: Right  Prep: Maximum Sterile Barrier Precautions used, chloraprep       Needles:  Injection technique: Single-shot  Needle Type: Echogenic Needle     Needle Length: 9cm  Needle Gauge: 21     Additional Needles:   Procedures:,,,, ultrasound used (permanent image in chart),,,,  Narrative:  Start time: 05/25/2020 1:55 PM End time: 05/25/2020 2:05 PM Injection made incrementally with aspirations every 5 mL.  Performed by: Personally  Anesthesiologist: Trevor Iha, MD  Additional Notes: Block assessed. Patient tolerated procedure well.

## 2020-05-25 NOTE — Discharge Instructions (Signed)
Traumatic Toe Amputation A traumatic toe amputation is when a person loses part or all of a toe because of an accident or injury. This condition is a medical emergency. It needs to be treated right away to prevent more damage to the toe and to save the lost part of the toe, if possible. What are the causes? This condition usually results from an accident that involves:  A car.  Power tools.  Factory work.  Farm or Risk manager. What increases the risk? The following factors may make you more likely to develop this condition:  Not wearing protective footwear while using power tools or lawn equipment.  Using power tools or lawn equipment while impaired from drugs or alcohol.  Using power tools or lawn equipment without proper instructions or training. What are the signs or symptoms? Symptoms of this condition include:  Bleeding.  Pain.  Damage to surrounding tissues, such as bones, muscles, tendons, and skin. How is this diagnosed? This condition is diagnosed with a physical exam. During the exam, your health care provider will determine how severe the injury is and the best way to treat it. X-rays may be done to check for damage to the surrounding bones and tissues. How is this treated? This condition is treated by cleaning the wound thoroughly and using medicines to treat pain. Additional treatment depends on the type of injury that you have and how severe it is.  If just the tip of your toe was removed, it may be treated by applying a protective bandage (dressing) to the toe and doing regular cleaning.  If the injury is more severe, a portion of skin may need to be taken from another part of the body (graft) and attached to the wound to help it heal.  If a large portion of the toe was removed, treatment may involve surgery to reattach the toe (replantation). Follow these instructions at home: If you have a postoperative shoe:   Wear the shoe as told by your health care  provider. Remove it only as told by your health care provider.  Loosen the shoe if your toes tingle, become numb, or turn cold and blue.  Keep the shoe clean and dry. Medicines  Take over-the-counter and prescription medicines only as told by your health care provider.  If you were prescribed an antibiotic medicine, use it as told by your health care provider. Do not stop using the antibiotic even if you start to feel better. Wound care   Follow instructions from your health care provider about how to take care of your wound. Make sure you: ? Wash your hands with soap and water before and after you change your dressing. If soap and water are not available, use hand sanitizer. ? Change your dressing as told by your health care provider. ? Leave stitches (sutures), skin glue, or adhesive strips in place. These skin closures may need to stay in place for 2 weeks or longer. If adhesive strip edges start to loosen and curl up, you may trim the loose edges. Do not remove adhesive strips completely unless your health care provider tells you to do that.  Check your wound every day for signs of infection. Check for: ? Redness, swelling, or pain. ? Fluid or blood. ? Warmth. ? Pus or a bad smell.  Do not take baths, swim, or use a hot tub until your health care provider approves. Ask your health care provider if you may take showers. You may only be allowed to  take sponge baths. General instructions  Do exercises to strengthen your toe and foot as told by your health care provider.  Do not use any products that contain nicotine or tobacco, such as cigarettes, e-cigarettes, and chewing tobacco. These can delay bone healing after injury. If you need help quitting, ask your health care provider.  Return to your normal activities as told by your health care provider. Ask your health care provider what activities are safe for you.  Keep your affected foot raised (elevated) above the level of your  heart while resting.  Keep all follow-up visits as told by your health care provider. This is important. Contact a health care provider if:  Your wound does not seem to be healing well.  You have redness, swelling, or pain around your wound.  You have fluid or blood coming from your wound.  Your wound feels warm to the touch.  You have pus or a bad smell coming from your wound.  You have a fever. Get help right away if:  You have redness extending away from your wound. Summary  A traumatic toe amputation is when a person loses part or all of a toe because of an accident or injury.  This condition needs to be treated right away in order to prevent more damage to the toe and, if possible, to save the lost part of the toe.  Follow instructions from your health care provider about how to take care of your wound.  Contact a health care provider if your wound is not healing well, or you have redness, swelling, pain, fluid, or warmth around your wound.  Get help right away if you have redness extending away from your wound. This information is not intended to replace advice given to you by your health care provider. Make sure you discuss any questions you have with your health care provider. Document Revised: 04/29/2018 Document Reviewed: 04/29/2018 Elsevier Patient Education  2020 [employer]   Traumatic Toe Amputation A traumatic toe amputation is when a person loses part or all of a toe because of an accident or injury. This condition is a medical emergency. It needs to be treated right away to prevent more damage to the toe and to save the lost part of the toe, if possible. What are the causes? This condition usually results from an accident that involves:  A car.  Power tools.  Factory work.  Farm or Risk manager. What increases the risk? The following factors may make you more likely to develop this condition:  Not wearing protective footwear while using power  tools or lawn equipment.  Using power tools or lawn equipment while impaired from drugs or alcohol.  Using power tools or lawn equipment without proper instructions or training. What are the signs or symptoms? Symptoms of this condition include:  Bleeding.  Pain.  Damage to surrounding tissues, such as bones, muscles, tendons, and skin. How is this diagnosed? This condition is diagnosed with a physical exam. During the exam, your health care provider will determine how severe the injury is and the best way to treat it. X-rays may be done to check for damage to the surrounding bones and tissues. How is this treated? This condition is treated by cleaning the wound thoroughly and using medicines to treat pain. Additional treatment depends on the type of injury that you have and how severe it is.  If just the tip of your toe was removed, it may be treated by applying a protective  bandage (dressing) to the toe and doing regular cleaning.  If the injury is more severe, a portion of skin may need to be taken from another part of the body (graft) and attached to the wound to help it heal.  If a large portion of the toe was removed, treatment may involve surgery to reattach the toe (replantation). Follow these instructions at home: If you have a postoperative shoe:   Wear the shoe as told by your health care provider. Remove it only as told by your health care provider.  Loosen the shoe if your toes tingle, become numb, or turn cold and blue.  Keep the shoe clean and dry. Medicines  Take over-the-counter and prescription medicines only as told by your health care provider.  If you were prescribed an antibiotic medicine, use it as told by your health care provider. Do not stop using the antibiotic even if you start to feel better. Wound care   Follow instructions from your health care provider about how to take care of your wound. Make sure you: ? Wash your hands with soap and water  before and after you change your dressing. If soap and water are not available, use hand sanitizer. ? Change your dressing as told by your health care provider. ? Leave stitches (sutures), skin glue, or adhesive strips in place. These skin closures may need to stay in place for 2 weeks or longer. If adhesive strip edges start to loosen and curl up, you may trim the loose edges. Do not remove adhesive strips completely unless your health care provider tells you to do that.  Check your wound every day for signs of infection. Check for: ? Redness, swelling, or pain. ? Fluid or blood. ? Warmth. ? Pus or a bad smell.  Do not take baths, swim, or use a hot tub until your health care provider approves. Ask your health care provider if you may take showers. You may only be allowed to take sponge baths. General instructions  Do exercises to strengthen your toe and foot as told by your health care provider.  Do not use any products that contain nicotine or tobacco, such as cigarettes, e-cigarettes, and chewing tobacco. These can delay bone healing after injury. If you need help quitting, ask your health care provider.  Return to your normal activities as told by your health care provider. Ask your health care provider what activities are safe for you.  Keep your affected foot raised (elevated) above the level of your heart while resting.  Keep all follow-up visits as told by your health care provider. This is important. Contact a health care provider if:  Your wound does not seem to be healing well.  You have redness, swelling, or pain around your wound.  You have fluid or blood coming from your wound.  Your wound feels warm to the touch.  You have pus or a bad smell coming from your wound.  You have a fever. Get help right away if:  You have redness extending away from your wound. Summary  A traumatic toe amputation is when a person loses part or all of a toe because of an accident or  injury.  This condition needs to be treated right away in order to prevent more damage to the toe and, if possible, to save the lost part of the toe.  Follow instructions from your health care provider about how to take care of your wound.  Contact a health care provider if your  wound is not healing well, or you have redness, swelling, pain, fluid, or warmth around your wound.  Get help right away if you have redness extending away from your wound. This information is not intended to replace advice given to you by your health care provider. Make sure you discuss any questions you have with your health care provider. Document Revised: 04/29/2018 Document Reviewed: 04/29/2018 Elsevier Patient Education  2020 [employer]   Traumatic Toe Amputation A traumatic toe amputation is when a person loses part or all of a toe because of an accident or injury. This condition is a medical emergency. It needs to be treated right away to prevent more damage to the toe and to save the lost part of the toe, if possible. What are the causes? This condition usually results from an accident that involves:  A car.  Power tools.  Factory work.  Farm or Risk manager. What increases the risk? The following factors may make you more likely to develop this condition:  Not wearing protective footwear while using power tools or lawn equipment.  Using power tools or lawn equipment while impaired from drugs or alcohol.  Using power tools or lawn equipment without proper instructions or training. What are the signs or symptoms? Symptoms of this condition include:  Bleeding.  Pain.  Damage to surrounding tissues, such as bones, muscles, tendons, and skin. How is this diagnosed? This condition is diagnosed with a physical exam. During the exam, your health care provider will determine how severe the injury is and the best way to treat it. X-rays may be done to check for damage to the surrounding bones  and tissues. How is this treated? This condition is treated by cleaning the wound thoroughly and using medicines to treat pain. Additional treatment depends on the type of injury that you have and how severe it is.  If just the tip of your toe was removed, it may be treated by applying a protective bandage (dressing) to the toe and doing regular cleaning.  If the injury is more severe, a portion of skin may need to be taken from another part of the body (graft) and attached to the wound to help it heal.  If a large portion of the toe was removed, treatment may involve surgery to reattach the toe (replantation). Follow these instructions at home: If you have a postoperative shoe:   Wear the shoe as told by your health care provider. Remove it only as told by your health care provider.  Loosen the shoe if your toes tingle, become numb, or turn cold and blue.  Keep the shoe clean and dry. Medicines  Take over-the-counter and prescription medicines only as told by your health care provider.  If you were prescribed an antibiotic medicine, use it as told by your health care provider. Do not stop using the antibiotic even if you start to feel better. Wound care   Follow instructions from your health care provider about how to take care of your wound. Make sure you: ? Wash your hands with soap and water before and after you change your dressing. If soap and water are not available, use hand sanitizer. ? Change your dressing as told by your health care provider. ? Leave stitches (sutures), skin glue, or adhesive strips in place. These skin closures may need to stay in place for 2 weeks or longer. If adhesive strip edges start to loosen and curl up, you may trim the loose edges. Do not remove  adhesive strips completely unless your health care provider tells you to do that.  Check your wound every day for signs of infection. Check for: ? Redness, swelling, or pain. ? Fluid or  blood. ? Warmth. ? Pus or a bad smell.  Do not take baths, swim, or use a hot tub until your health care provider approves. Ask your health care provider if you may take showers. You may only be allowed to take sponge baths. General instructions  Do exercises to strengthen your toe and foot as told by your health care provider.  Do not use any products that contain nicotine or tobacco, such as cigarettes, e-cigarettes, and chewing tobacco. These can delay bone healing after injury. If you need help quitting, ask your health care provider.  Return to your normal activities as told by your health care provider. Ask your health care provider what activities are safe for you.  Keep your affected foot raised (elevated) above the level of your heart while resting.  Keep all follow-up visits as told by your health care provider. This is important. Contact a health care provider if:  Your wound does not seem to be healing well.  You have redness, swelling, or pain around your wound.  You have fluid or blood coming from your wound.  Your wound feels warm to the touch.  You have pus or a bad smell coming from your wound.  You have a fever. Get help right away if:  You have redness extending away from your wound. Summary  A traumatic toe amputation is when a person loses part or all of a toe because of an accident or injury.  This condition needs to be treated right away in order to prevent more damage to the toe and, if possible, to save the lost part of the toe.  Follow instructions from your health care provider about how to take care of your wound.  Contact a health care provider if your wound is not healing well, or you have redness, swelling, pain, fluid, or warmth around your wound.  Get help right away if you have redness extending away from your wound. This information is not intended to replace advice given to you by your health care provider. Make sure you discuss any  questions you have with your health care provider. Document Revised: 04/29/2018 Document Reviewed: 04/29/2018 Elsevier Patient Education  2020 [employer]   Traumatic Toe Amputation A traumatic toe amputation is when a person loses part or all of a toe because of an accident or injury. This condition is a medical emergency. It needs to be treated right away to prevent more damage to the toe and to save the lost part of the toe, if possible. What are the causes? This condition usually results from an accident that involves:  A car.  Power tools.  Factory work.  Farm or Risk manager. What increases the risk? The following factors may make you more likely to develop this condition:  Not wearing protective footwear while using power tools or lawn equipment.  Using power tools or lawn equipment while impaired from drugs or alcohol.  Using power tools or lawn equipment without proper instructions or training. What are the signs or symptoms? Symptoms of this condition include:  Bleeding.  Pain.  Damage to surrounding tissues, such as bones, muscles, tendons, and skin. How is this diagnosed? This condition is diagnosed with a physical exam. During the exam, your health care provider will determine how severe the injury  is and the best way to treat it. X-rays may be done to check for damage to the surrounding bones and tissues. How is this treated? This condition is treated by cleaning the wound thoroughly and using medicines to treat pain. Additional treatment depends on the type of injury that you have and how severe it is.  If just the tip of your toe was removed, it may be treated by applying a protective bandage (dressing) to the toe and doing regular cleaning.  If the injury is more severe, a portion of skin may need to be taken from another part of the body (graft) and attached to the wound to help it heal.  If a large portion of the toe was removed, treatment may involve  surgery to reattach the toe (replantation). Follow these instructions at home: If you have a postoperative shoe:   Wear the shoe as told by your health care provider. Remove it only as told by your health care provider.  Loosen the shoe if your toes tingle, become numb, or turn cold and blue.  Keep the shoe clean and dry. Medicines  Take over-the-counter and prescription medicines only as told by your health care provider.  If you were prescribed an antibiotic medicine, use it as told by your health care provider. Do not stop using the antibiotic even if you start to feel better. Wound care   Follow instructions from your health care provider about how to take care of your wound. Make sure you: ? Wash your hands with soap and water before and after you change your dressing. If soap and water are not available, use hand sanitizer. ? Change your dressing as told by your health care provider. ? Leave stitches (sutures), skin glue, or adhesive strips in place. These skin closures may need to stay in place for 2 weeks or longer. If adhesive strip edges start to loosen and curl up, you may trim the loose edges. Do not remove adhesive strips completely unless your health care provider tells you to do that.  Check your wound every day for signs of infection. Check for: ? Redness, swelling, or pain. ? Fluid or blood. ? Warmth. ? Pus or a bad smell.  Do not take baths, swim, or use a hot tub until your health care provider approves. Ask your health care provider if you may take showers. You may only be allowed to take sponge baths. General instructions  Do exercises to strengthen your toe and foot as told by your health care provider.  Do not use any products that contain nicotine or tobacco, such as cigarettes, e-cigarettes, and chewing tobacco. These can delay bone healing after injury. If you need help quitting, ask your health care provider.  Return to your normal activities as told by  your health care provider. Ask your health care provider what activities are safe for you.  Keep your affected foot raised (elevated) above the level of your heart while resting.  Keep all follow-up visits as told by your health care provider. This is important. Contact a health care provider if:  Your wound does not seem to be healing well.  You have redness, swelling, or pain around your wound.  You have fluid or blood coming from your wound.  Your wound feels warm to the touch.  You have pus or a bad smell coming from your wound.  You have a fever. Get help right away if:  You have redness extending away from your wound. Summary  A traumatic toe amputation is when a person loses part or all of a toe because of an accident or injury.  This condition needs to be treated right away in order to prevent more damage to the toe and, if possible, to save the lost part of the toe.  Follow instructions from your health care provider about how to take care of your wound.  Contact a health care provider if your wound is not healing well, or you have redness, swelling, pain, fluid, or warmth around your wound.  Get help right away if you have redness extending away from your wound. This information is not intended to replace advice given to you by your health care provider. Make sure you discuss any questions you have with your health care provider. Document Revised: 04/29/2018 Document Reviewed: 04/29/2018 Elsevier Patient Education  2020 [employer]   Traumatic Toe Amputation A traumatic toe amputation is when a person loses part or all of a toe because of an accident or injury. This condition is a medical emergency. It needs to be treated right away to prevent more damage to the toe and to save the lost part of the toe, if possible. What are the causes? This condition usually results from an accident that involves:  A car.  Power tools.  Factory work.  Farm or Personnel officer. What increases the risk? The following factors may make you more likely to develop this condition:  Not wearing protective footwear while using power tools or lawn equipment.  Using power tools or lawn equipment while impaired from drugs or alcohol.  Using power tools or lawn equipment without proper instructions or training. What are the signs or symptoms? Symptoms of this condition include:  Bleeding.  Pain.  Damage to surrounding tissues, such as bones, muscles, tendons, and skin. How is this diagnosed? This condition is diagnosed with a physical exam. During the exam, your health care provider will determine how severe the injury is and the best way to treat it. X-rays may be done to check for damage to the surrounding bones and tissues. How is this treated? This condition is treated by cleaning the wound thoroughly and using medicines to treat pain. Additional treatment depends on the type of injury that you have and how severe it is.  If just the tip of your toe was removed, it may be treated by applying a protective bandage (dressing) to the toe and doing regular cleaning.  If the injury is more severe, a portion of skin may need to be taken from another part of the body (graft) and attached to the wound to help it heal.  If a large portion of the toe was removed, treatment may involve surgery to reattach the toe (replantation). Follow these instructions at home: If you have a postoperative shoe:   Wear the shoe as told by your health care provider. Remove it only as told by your health care provider.  Loosen the shoe if your toes tingle, become numb, or turn cold and blue.  Keep the shoe clean and dry. Medicines  Take over-the-counter and prescription medicines only as told by your health care provider.  If you were prescribed an antibiotic medicine, use it as told by your health care provider. Do not stop using the antibiotic even if you start to feel  better. Wound care   Follow instructions from your health care provider about how to take care of your wound. Make sure you: ? Wash your hands with  soap and water before and after you change your dressing. If soap and water are not available, use hand sanitizer. ? Change your dressing as told by your health care provider. ? Leave stitches (sutures), skin glue, or adhesive strips in place. These skin closures may need to stay in place for 2 weeks or longer. If adhesive strip edges start to loosen and curl up, you may trim the loose edges. Do not remove adhesive strips completely unless your health care provider tells you to do that.  Check your wound every day for signs of infection. Check for: ? Redness, swelling, or pain. ? Fluid or blood. ? Warmth. ? Pus or a bad smell.  Do not take baths, swim, or use a hot tub until your health care provider approves. Ask your health care provider if you may take showers. You may only be allowed to take sponge baths. General instructions  Do exercises to strengthen your toe and foot as told by your health care provider.  Do not use any products that contain nicotine or tobacco, such as cigarettes, e-cigarettes, and chewing tobacco. These can delay bone healing after injury. If you need help quitting, ask your health care provider.  Return to your normal activities as told by your health care provider. Ask your health care provider what activities are safe for you.  Keep your affected foot raised (elevated) above the level of your heart while resting.  Keep all follow-up visits as told by your health care provider. This is important. Contact a health care provider if:  Your wound does not seem to be healing well.  You have redness, swelling, or pain around your wound.  You have fluid or blood coming from your wound.  Your wound feels warm to the touch.  You have pus or a bad smell coming from your wound.  You have a fever. Get help right  away if:  You have redness extending away from your wound. Summary  A traumatic toe amputation is when a person loses part or all of a toe because of an accident or injury.  This condition needs to be treated right away in order to prevent more damage to the toe and, if possible, to save the lost part of the toe.  Follow instructions from your health care provider about how to take care of your wound.  Contact a health care provider if your wound is not healing well, or you have redness, swelling, pain, fluid, or warmth around your wound.  Get help right away if you have redness extending away from your wound. This information is not intended to replace advice given to you by your health care provider. Make sure you discuss any questions you have with your health care provider. Document Revised: 04/29/2018 Document Reviewed: 04/29/2018 Elsevier Patient Education  2020 [employer]   Traumatic Toe Amputation A traumatic toe amputation is when a person loses part or all of a toe because of an accident or injury. This condition is a medical emergency. It needs to be treated right away to prevent more damage to the toe and to save the lost part of the toe, if possible. What are the causes? This condition usually results from an accident that involves:  A car.  Power tools.  Factory work.  Farm or Risk manager. What increases the risk? The following factors may make you more likely to develop this condition:  Not wearing protective footwear while using power tools or lawn equipment.  Using power  tools or lawn equipment while impaired from drugs or alcohol.  Using power tools or lawn equipment without proper instructions or training. What are the signs or symptoms? Symptoms of this condition include:  Bleeding.  Pain.  Damage to surrounding tissues, such as bones, muscles, tendons, and skin. How is this diagnosed? This condition is diagnosed with a physical exam. During  the exam, your health care provider will determine how severe the injury is and the best way to treat it. X-rays may be done to check for damage to the surrounding bones and tissues. How is this treated? This condition is treated by cleaning the wound thoroughly and using medicines to treat pain. Additional treatment depends on the type of injury that you have and how severe it is.  If just the tip of your toe was removed, it may be treated by applying a protective bandage (dressing) to the toe and doing regular cleaning.  If the injury is more severe, a portion of skin may need to be taken from another part of the body (graft) and attached to the wound to help it heal.  If a large portion of the toe was removed, treatment may involve surgery to reattach the toe (replantation). Follow these instructions at home: If you have a postoperative shoe:   Wear the shoe as told by your health care provider. Remove it only as told by your health care provider.  Loosen the shoe if your toes tingle, become numb, or turn cold and blue.  Keep the shoe clean and dry. Medicines  Take over-the-counter and prescription medicines only as told by your health care provider.  If you were prescribed an antibiotic medicine, use it as told by your health care provider. Do not stop using the antibiotic even if you start to feel better. Wound care   Follow instructions from your health care provider about how to take care of your wound. Make sure you: ? Wash your hands with soap and water before and after you change your dressing. If soap and water are not available, use hand sanitizer. ? Change your dressing as told by your health care provider. ? Leave stitches (sutures), skin glue, or adhesive strips in place. These skin closures may need to stay in place for 2 weeks or longer. If adhesive strip edges start to loosen and curl up, you may trim the loose edges. Do not remove adhesive strips completely unless your  health care provider tells you to do that.  Check your wound every day for signs of infection. Check for: ? Redness, swelling, or pain. ? Fluid or blood. ? Warmth. ? Pus or a bad smell.  Do not take baths, swim, or use a hot tub until your health care provider approves. Ask your health care provider if you may take showers. You may only be allowed to take sponge baths. General instructions  Do exercises to strengthen your toe and foot as told by your health care provider.  Do not use any products that contain nicotine or tobacco, such as cigarettes, e-cigarettes, and chewing tobacco. These can delay bone healing after injury. If you need help quitting, ask your health care provider.  Return to your normal activities as told by your health care provider. Ask your health care provider what activities are safe for you.  Keep your affected foot raised (elevated) above the level of your heart while resting.  Keep all follow-up visits as told by your health care provider. This is important. Contact a  health care provider if:  Your wound does not seem to be healing well.  You have redness, swelling, or pain around your wound.  You have fluid or blood coming from your wound.  Your wound feels warm to the touch.  You have pus or a bad smell coming from your wound.  You have a fever. Get help right away if:  You have redness extending away from your wound. Summary  A traumatic toe amputation is when a person loses part or all of a toe because of an accident or injury.  This condition needs to be treated right away in order to prevent more damage to the toe and, if possible, to save the lost part of the toe.  Follow instructions from your health care provider about how to take care of your wound.  Contact a health care provider if your wound is not healing well, or you have redness, swelling, pain, fluid, or warmth around your wound.  Get help right away if you have redness  extending away from your wound. This information is not intended to replace advice given to you by your health care provider. Make sure you discuss any questions you have with your health care provider. Document Revised: 04/29/2018 Document Reviewed: 04/29/2018 Elsevier Patient Education  2020 [employer].

## 2020-05-25 NOTE — Anesthesia Postprocedure Evaluation (Signed)
Anesthesia Post Note  Patient: Scott Greer  Procedure(s) Performed: RIGHT GREAT TOE AMPUTATION (Right Toe)     Patient location during evaluation: PACU Anesthesia Type: Regional Level of consciousness: awake and alert Pain management: pain level controlled Vital Signs Assessment: post-procedure vital signs reviewed and stable Respiratory status: spontaneous breathing, nonlabored ventilation, respiratory function stable and patient connected to nasal cannula oxygen Cardiovascular status: stable and blood pressure returned to baseline Postop Assessment: no apparent nausea or vomiting Anesthetic complications: no   No complications documented.  Last Vitals:  Vitals:   05/25/20 1545 05/25/20 1600  BP: (!) 161/89   Pulse: 80 84  Resp: 13 18  Temp:    SpO2: 100% 100%    Last Pain:  Vitals:   05/25/20 1600  TempSrc:   PainSc: (P) 0-No pain                 Trevor Iha

## 2020-05-25 NOTE — Op Note (Signed)
05/25/2020  3:19 PM  PATIENT:  Scott Greer    PRE-OPERATIVE DIAGNOSIS:  Gangrene, Osteomyelitis Right Great Toe  POST-OPERATIVE DIAGNOSIS:  Same  PROCEDURE:  RIGHT GREAT TOE AMPUTATION  SURGEON:  [name], MD  PHYSICIAN ASSISTANT:None ANESTHESIA:   General  PREOPERATIVE INDICATIONS:  Scott Greer is a  64 y.o. male with a diagnosis of Gangrene, Osteomyelitis Right Great Toe who failed conservative measures and elected for surgical management.    The risks benefits and alternatives were discussed with the patient preoperatively including but not limited to the risks of infection, bleeding, nerve injury, cardiopulmonary complications, the need for revision surgery, among others, and the patient was willing to proceed.  OPERATIVE IMPLANTS: None  @ENCIMAGES @  OPERATIVE FINDINGS: Petechial bleeding at the amputation site  OPERATIVE PROCEDURE: Patient was brought the operating room after undergoing a regional anesthetic the right lower extremity was prepped using DuraPrep draped into a sterile field a timeout was called.  A fishmouth incision was made just distal to the MTP joint.  The great toe was amputated through the MTP joint.  There is no signs of infection no abscess at the amputation site.  Electrocautery was used hemostasis wound was irrigated with normal saline incision was closed using 2-0 nylon a sterile dressing was applied patient was taken the PACU in stable condition.   DISCHARGE PLANNING:  Antibiotic duration: Preoperative antibiotics patient was unable to afford oral antibiotics  Weightbearing: Touchdown weightbearing on the right  Pain medication: Prescription for Percocet  Dressing care/ Wound VAC: Follow-up in the office in 1 week to change the dressing  Ambulatory devices:crutches  Discharge to: home  Follow-up: In the office 1 week post operative.

## 2020-05-25 NOTE — H&P (Signed)
Scott Greer is an 64 y.o. male.   Chief Complaint: Right great Toe Osteomyelitis HPI:  Patient is a 64 year old gentleman who presents with bilateral foot pain.  He is status post a left first ray amputation in September 2020.  Patient states his car recently has broken down and he has had to walk a lot for the past 2 weeks.  Patient states he has had progressive ulceration of pain of the right great toe Past Medical History:  Diagnosis Date  . Dehiscence of amputation stump (HCC)    left great toe  . Diabetes mellitus without complication (HCC) 1999   Type II  . Glaucoma 2015  . Hyperlipidemia   . Hypertension 2005  . Osteomyelitis (HCC)   . Wears glasses     Past Surgical History:  Procedure Laterality Date  . AMPUTATION Left 06/05/2019   Procedure: LEFT GREAT TOE AMPUTATION;  Surgeon: [name], MD;  Location: [facility] OR;  Service: Orthopedics;  Laterality: Left;  . AMPUTATION Left 07/10/2019   Procedure: LEFT FOOT 1ST RAY AMPUTATION;  Surgeon: [name], MD;  Location: [facility] OR;  Service: Orthopedics;  Laterality: Left;  . NO PAST SURGERIES      Family History  Problem Relation Age of Onset  . Stroke Mother   . Diabetes Mother        Toward end of life   Social History:  reports that he has been smoking cigars. He has quit using smokeless tobacco.  His smokeless tobacco use included chew. He reports current alcohol use. He reports current drug use. Drug: Marijuana.  Allergies:  Allergies  Allergen Reactions  . Bee Venom Hives, Itching and Swelling    No medications prior to admission.    No results found for this or any previous visit (from the past 48 hour(s)). XR Foot Complete Left  Result Date: 05/23/2020 Three-view radiographs of the left foot shows previous first ray amputation with calcification of the arteries out to the toes.  No destructive bony changes.  XR Foot Complete Right  Result Date: 05/23/2020 Three-view radiographs of the right foot shows  destructive bony changes of the tuft of the great toe with significant peripheral vascular disease with calcification of the arteries in the foot out to the toes.   Review of Systems  All other systems reviewed and are negative.   Height 6\' 3"  (1.905 m), weight 97.5 kg. Physical Exam  Patient is alert, oriented, no adenopathy, well-dressed, normal affect, normal respiratory effort. Examination of the left foot there is no swelling no cellulitis the first ray amputation is well-healed.  Examination of the right foot he has a full-thickness gangrenous ulcer of the tip of the right great toe there is exposed bone of the tuft of the toe with destructive bony changes consistent with osteomyelitis.  A Doppler was used and he has a biphasic dorsalis pedis and posterior tibial pulse on the right.Heart RRR Lungs clr Assessment/Plan 1. Bilateral foot pain   2. Osteomyelitis of great toe of right foot (HCC)     Plan: With the necrotic ulcer of the right great toe exposed bone with destructive bony changes discussed that at a minimum we will need to proceed with an amputation of the right great toe at the MTP joint.  Risks and benefits were discussed including risk of the wound not healing need for higher level amputation.  Patient states he understands wished to proceed.  Prescription for Percocet is called and he will continue with  his oral antibiotics.   Scott Bali Scott Kjos, PA 05/25/2020, 6:52 AM

## 2020-05-25 NOTE — Transfer of Care (Signed)
Immediate Anesthesia Transfer of Care Note  Patient: Scott Greer  Procedure(s) Performed: RIGHT GREAT TOE AMPUTATION (Right Toe)  Patient Location: PACU  Anesthesia Type:MAC combined with regional for post-op pain  Level of Consciousness: drowsy and patient cooperative  Airway & Oxygen Therapy: Patient Spontanous Breathing  Post-op Assessment: Report given to RN and Post -op Vital signs reviewed and stable  Post vital signs: Reviewed and stable  Last Vitals:  Vitals Value Taken Time  BP 175/95 05/25/20 1543  Temp 36.9 C 05/25/20 1528  Pulse 79 05/25/20 1545  Resp 14 05/25/20 1545  SpO2 100 % 05/25/20 1545  Vitals shown include unvalidated device data.  Last Pain:  Vitals:   05/25/20 1528  TempSrc:   PainSc: Asleep      Patients Stated Pain Goal: 3 (74/71/59 5396)  Complications: No complications documented.

## 2020-05-26 ENCOUNTER — Encounter (HOSPITAL_COMMUNITY): Payer: Self-pay | Admitting: Orthopedic Surgery

## 2020-05-27 ENCOUNTER — Inpatient Hospital Stay: Payer: 59 | Admitting: Physician Assistant

## 2020-05-30 ENCOUNTER — Ambulatory Visit (INDEPENDENT_AMBULATORY_CARE_PROVIDER_SITE_OTHER): Payer: 59 | Admitting: Physician Assistant

## 2020-05-30 ENCOUNTER — Encounter: Payer: Self-pay | Admitting: Orthopedic Surgery

## 2020-05-30 VITALS — Ht 75.0 in | Wt 218.0 lb

## 2020-05-30 DIAGNOSIS — M869 Osteomyelitis, unspecified: Secondary | ICD-10-CM

## 2020-05-30 MED FILL — metFORMIN HCL 1000 MG TABS: 1000 | 30 days supply | Qty: 60 | Fill #0

## 2020-05-30 MED FILL — LISINOPRIL 10 MG TABS: 10 | 30 days supply | Qty: 30 | Fill #0

## 2020-05-30 MED FILL — ATORVASTATIN 10 MG TABLET: 10 | 30 days supply | Qty: 30 | Fill #0

## 2020-05-30 NOTE — Progress Notes (Signed)
Office Visit Note   Patient: Scott Greer           Date of Birth: 21-May-1956           MRN: [id number] Visit Date: 05/30/2020              Requested by: [name], MD [address] [place],  [place] [address] PCP: [name], MD  Chief Complaint  Patient presents with   Right Foot - Routine Post Op    05/25/20 right GT amputation       HPI: Is a gentleman who is 1 week status post right great toe amputation.  He is is also status post left great toe amputation.  He is having difficulty with balance because of his lack of medial support.  He is wearing a tennis shoe on the left and is not very supportive.  I also discussed with the patient that he needs to follow-up on glycemic control.  [facility] diabetic [facility] as he is not taking them currently I told him this is should be a priority and he is going to do this today  Assessment & Plan: Visit Diagnoses: No diagnosis found.  Plan: He needs to continue to off weight on the right side while its healing.  We did discuss that he will need bilateral carbon fiber inserts to give him support and balance as well as stiffer shoes.  Should continue to use crutches or something for balance such as a walker.  We will follow up in 1 week  Follow-Up Instructions: No follow-ups on file.   Ortho Exam  Patient is alert, oriented, no adenopathy, well-dressed, normal affect, normal respiratory effort. Focused examination of his right great toe amputation well apposed wound edges no necrosis no cellulitis mild to moderate soft tissue swelling mild foul odor no ascending cellulitis  Imaging: No results found. No images are attached to the encounter.  Labs: Lab Results  Component Value Date   HGBA1C 8.9 (A) 10/23/2019   HGBA1C 10.3 (H) 06/04/2019   ESRSEDRATE 78 (H) 06/04/2019   CRP 4.5 (H) 06/04/2019   REPTSTATUS 06/10/2019 FINAL 06/05/2019   CULT  06/05/2019      NO GROWTH 5 DAYS Performed at [facility], [address] [place], [place] [address]      Lab Results  Component Value Date   ALBUMIN 3.8 10/23/2019   ALBUMIN 3.4 (L) 06/03/2019   ALBUMIN 3.6 05/14/2019   PREALBUMIN 13.8 (L) 06/04/2019    No results found for: MG No results found for: VD25OH  Lab Results  Component Value Date   PREALBUMIN 13.8 (L) 06/04/2019   CBC EXTENDED Latest Ref Rng & Units 05/25/2020 07/10/2019 06/08/2019  WBC 4.0 - 10.5 K/uL 10.1 6.8 6.1  RBC 4.22 - 5.81 MIL/uL 3.73(L) 3.90(L) 3.49(L)  HGB 13.0 - 17.0 g/dL 11.4(L) 11.2(L) 10.1(L)  HCT 39 - 52 % 34.5(L) 34.7(L) 30.1(L)  PLT 150 - 400 K/uL 366 273 242  NEUTROABS 1.7 - 7.7 K/uL - - -  LYMPHSABS 0.7 - 4.0 K/uL - - -     Body mass index is 27.25 kg/m.  Orders:  No orders of the defined types were placed in this encounter.  No orders of the defined types were placed in this encounter.    Procedures: No procedures performed  Clinical Data: No additional findings.  ROS:  All other systems negative, except as noted in  the HPI. Review of Systems  Objective: Vital Signs: Ht 6\' 3"  (1.905 m)    Wt (!) 218 lb (98.9 kg)    BMI 27.25 kg/m   Specialty Comments:  No specialty comments available.  PMFS History: Patient Active Problem List   Diagnosis Date Noted   Positive for macroalbuminuria 10/24/2019   Amputation of left great toe (HCC) 10/23/2019   Tobacco dependence 10/23/2019   Osteomyelitis of great toe of right foot (HCC) 06/04/2019   Diabetic foot infection (HCC)    Noncompliance with medication regimen    Glaucoma    Hyperlipidemia    Hypertension 11/06/2003   Diabetes mellitus (HCC) 11/05/1997   Past Medical History:  Diagnosis Date   Dehiscence of amputation stump (HCC)    left great toe   Diabetes mellitus without complication (HCC) 1999   Type II   Glaucoma 2015   Hyperlipidemia    Hypertension 2005   Osteomyelitis (HCC)    Wears glasses      Family History  Problem Relation Age of Onset   Stroke Mother    Diabetes Mother        Toward end of life    Past Surgical History:  Procedure Laterality Date   AMPUTATION Left 06/05/2019   Procedure: LEFT GREAT TOE AMPUTATION;  Surgeon: 06/07/2019, MD;  Location: [facility] OR;  Service: Orthopedics;  Laterality: Left;   AMPUTATION Left 07/10/2019   Procedure: LEFT FOOT 1ST RAY AMPUTATION;  Surgeon: 09/09/2019, MD;  Location: [facility] OR;  Service: Orthopedics;  Laterality: Left;   AMPUTATION Right 05/25/2020   Procedure: RIGHT GREAT TOE AMPUTATION;  Surgeon: 05/27/2020, MD;  Location: [facility] OR;  Service: Orthopedics;  Laterality: Right;   NO PAST SURGERIES     Social History   Occupational History   Not on file  Tobacco Use   Smoking status: Current Every Day Smoker    Types: Cigars   Smokeless tobacco: Former [facility]    Types: Chew   Tobacco comment: 6 daily  Vaping Use   Vaping Use: Never used  Substance and Sexual Activity   Alcohol use: Yes    Comment: occasional   Drug use: Yes    Types: Marijuana    Comment: ocassional - last time 05/08/2020   Sexual activity: Not on file

## 2020-06-02 ENCOUNTER — Telehealth: Payer: Self-pay | Admitting: Orthopedic Surgery

## 2020-06-02 ENCOUNTER — Other Ambulatory Visit: Payer: Self-pay | Admitting: Physician Assistant

## 2020-06-02 MED ORDER — OXYCODONE-ACETAMINOPHEN 5-325 MG PO TABS: 1.0000 | ORAL_TABLET | ORAL | 0 refills | Status: DC | PRN

## 2020-06-02 NOTE — Telephone Encounter (Signed)
Patient called.   He is requesting a refill on his oxycodone  Call back: [phone]

## 2020-06-02 NOTE — Telephone Encounter (Signed)
Please advise [name]. Patient is s/p right foot great toe amp on 05/25/20. Thank you.

## 2020-06-02 NOTE — Telephone Encounter (Signed)
Medication refilled

## 2020-06-06 ENCOUNTER — Encounter: Payer: Self-pay | Admitting: Physician Assistant

## 2020-06-06 ENCOUNTER — Ambulatory Visit (INDEPENDENT_AMBULATORY_CARE_PROVIDER_SITE_OTHER): Payer: 59 | Admitting: Physician Assistant

## 2020-06-06 ENCOUNTER — Ambulatory Visit: Payer: 59 | Admitting: Orthopedic Surgery

## 2020-06-06 DIAGNOSIS — M869 Osteomyelitis, unspecified: Secondary | ICD-10-CM

## 2020-06-06 MED ORDER — OXYCODONE-ACETAMINOPHEN 5-325 MG PO TABS: 1.0000 | ORAL_TABLET | Freq: Four times a day (QID) | ORAL | 0 refills | Status: DC | PRN

## 2020-06-06 NOTE — Progress Notes (Signed)
Office Visit Note   Patient: Scott Greer           Date of Birth: [birth year]           MRN: [id number] Visit Date: 06/06/2020              Requested by: [name], MD [address] [place],  [place] [address] PCP: [name], MD  Chief Complaint  Patient presents with   Right Foot - Pain, Follow-up      HPI: Patient presents today for follow-up on his right foot.  He is 2 weeks status post right great toe amputation.  He is also status post left first ray amputation.  He feels off balance.  He is wearing regular shoes.  He denies any fever chills  Assessment & Plan: Visit Diagnoses: No diagnosis found.  Plan: Continue daily dressing changes.  When healed we will furnish a prescription for orthotics with a carbon fiber plate.  We will continue with dry dressing changes  Follow-Up Instructions: No follow-ups on file.   Ortho Exam  Patient is alert, oriented, no adenopathy, well-dressed, normal affect, normal respiratory effort. Very small amount of wound dehiscence with small amount of serous drainage no foul odor no fluctuance swelling is overall well controlled no cellulitis no evidence of any acute infection  Imaging: No results found. No images are attached to the encounter.  Labs: Lab Results  Component Value Date   HGBA1C 8.9 (A) 10/23/2019   HGBA1C 10.3 (H) 06/04/2019   ESRSEDRATE 78 (H) 06/04/2019   CRP 4.5 (H) 06/04/2019   REPTSTATUS 06/10/2019 FINAL 06/05/2019   CULT  06/05/2019    NO GROWTH 5 DAYS Performed at [facility], [address] [place], [place] [address]      Lab Results  Component Value Date   ALBUMIN 3.8 10/23/2019   ALBUMIN 3.4 (L) 06/03/2019   ALBUMIN 3.6 05/14/2019   PREALBUMIN 13.8 (L) 06/04/2019    No results found for: MG No results found for: VD25OH  Lab Results  Component Value Date   PREALBUMIN 13.8 (L) 06/04/2019   CBC EXTENDED Latest Ref Rng & Units 05/25/2020 07/10/2019 06/08/2019  WBC 4.0  - 10.5 K/uL 10.1 6.8 6.1  RBC 4.22 - 5.81 MIL/uL 3.73(L) 3.90(L) 3.49(L)  HGB 13.0 - 17.0 g/dL 11.4(L) 11.2(L) 10.1(L)  HCT 39 - 52 % 34.5(L) 34.7(L) 30.1(L)  PLT 150 - 400 K/uL 366 273 242  NEUTROABS 1.7 - 7.7 K/uL - - -  LYMPHSABS 0.7 - 4.0 K/uL - - -     There is no height or weight on file to calculate BMI.  Orders:  No orders of the defined types were placed in this encounter.  No orders of the defined types were placed in this encounter.    Procedures: No procedures performed  Clinical Data: No additional findings.  ROS:  All other systems negative, except as noted in the HPI. Review of Systems  Objective: Vital Signs: There were no vitals taken for this visit.  Specialty Comments:  No specialty comments available.  PMFS History: Patient Active Problem List   Diagnosis Date Noted   Positive for macroalbuminuria 10/24/2019   Amputation of left great toe (HCC) 10/23/2019   Tobacco dependence 10/23/2019   Osteomyelitis of great toe of right foot (HCC) 06/04/2019   Diabetic foot infection (HCC)    Noncompliance with medication regimen    Glaucoma    Hyperlipidemia    Hypertension 11/06/2003  Diabetes mellitus (HCC) 11/05/1997   Past Medical History:  Diagnosis Date   Dehiscence of amputation stump (HCC)    left great toe   Diabetes mellitus without complication (HCC) 1999   Type II   Glaucoma 2015   Hyperlipidemia    Hypertension 2005   Osteomyelitis (HCC)    Wears glasses     Family History  Problem Relation Age of Onset   Stroke Mother    Diabetes Mother        Toward end of life    Past Surgical History:  Procedure Laterality Date   AMPUTATION Left 06/05/2019   Procedure: LEFT GREAT TOE AMPUTATION;  Surgeon: [name], MD;  Location: [facility] OR;  Service: Orthopedics;  Laterality: Left;   AMPUTATION Left 07/10/2019   Procedure: LEFT FOOT 1ST RAY AMPUTATION;  Surgeon: [name], MD;  Location: [facility] OR;  Service:  Orthopedics;  Laterality: Left;   AMPUTATION Right 05/25/2020   Procedure: RIGHT GREAT TOE AMPUTATION;  Surgeon: [name], MD;  Location: [facility] OR;  Service: Orthopedics;  Laterality: Right;   NO PAST SURGERIES     Social History   Occupational History   Not on file  Tobacco Use   Smoking status: Current Every Day Smoker    Types: Cigars   Smokeless tobacco: Former Neurosurgeon    Types: Chew   Tobacco comment: 6 daily  Vaping Use   Vaping Use: Never used  Substance and Sexual Activity   Alcohol use: Yes    Comment: occasional   Drug use: Yes    Types: Marijuana    Comment: ocassional - last time 05/08/2020   Sexual activity: Not on file

## 2020-06-13 ENCOUNTER — Ambulatory Visit (INDEPENDENT_AMBULATORY_CARE_PROVIDER_SITE_OTHER): Payer: 59 | Admitting: Physician Assistant

## 2020-06-13 ENCOUNTER — Encounter: Payer: Self-pay | Admitting: Physician Assistant

## 2020-06-13 VITALS — Ht 75.0 in | Wt 218.0 lb

## 2020-06-13 DIAGNOSIS — M869 Osteomyelitis, unspecified: Secondary | ICD-10-CM

## 2020-06-13 MED ORDER — OXYCODONE-ACETAMINOPHEN 5-325 MG PO TABS: 1.0000 | ORAL_TABLET | Freq: Three times a day (TID) | ORAL | 0 refills | Status: DC | PRN

## 2020-06-13 NOTE — Progress Notes (Signed)
Office Visit Note   Patient: Scott Greer           Date of Birth: 08-Jun-1956           MRN: [id number] Visit Date: 06/13/2020              Requested by: [name], MD [address] [place],  [place] [address] PCP: [name], MD  Chief Complaint  Patient presents with  . Right Foot - Follow-up      HPI: This is a pleasant gentleman who is status post great toe amputation approximately 3 weeks ago.  He is also status post ray amputation on the other foot.  His biggest complaint is of difficulty with balance and walking.  Assessment & Plan: Visit Diagnoses: No diagnosis found.  Plan: We did remove to the surgical sutures today.  Follow-up in 1 week hopefully remove the final suture.  We did give him a prescription for extra-depth shoes with fillers and carbon fiber plate.  Follow-up in 1 week  Follow-Up Instructions: No follow-ups on file.   Ortho Exam  Patient is alert, oriented, no adenopathy, well-dressed, normal affect, normal respiratory effort. Focused examination demonstrates overall well-healed incision.  On the lateral side of incision just a small amount of drainage and dehiscence.  No surrounding cellulitis pain or foul odor no evidence of infection  Imaging: No results found. No images are attached to the encounter.  Labs: Lab Results  Component Value Date   HGBA1C 8.9 (A) 10/23/2019   HGBA1C 10.3 (H) 06/04/2019   ESRSEDRATE 78 (H) 06/04/2019   CRP 4.5 (H) 06/04/2019   REPTSTATUS 06/10/2019 FINAL 06/05/2019   CULT  06/05/2019    NO GROWTH 5 DAYS Performed at [facility], [address] [place], [place] [address]      Lab Results  Component Value Date   ALBUMIN 3.8 10/23/2019   ALBUMIN 3.4 (L) 06/03/2019   ALBUMIN 3.6 05/14/2019   PREALBUMIN 13.8 (L) 06/04/2019    No results found for: MG No results found for: VD25OH  Lab Results  Component Value Date   PREALBUMIN 13.8 (L) 06/04/2019   CBC EXTENDED Latest Ref  Rng & Units 05/25/2020 07/10/2019 06/08/2019  WBC 4.0 - 10.5 K/uL 10.1 6.8 6.1  RBC 4.22 - 5.81 MIL/uL 3.73(L) 3.90(L) 3.49(L)  HGB 13.0 - 17.0 g/dL 11.4(L) 11.2(L) 10.1(L)  HCT 39 - 52 % 34.5(L) 34.7(L) 30.1(L)  PLT 150 - 400 K/uL 366 273 242  NEUTROABS 1.7 - 7.7 K/uL - - -  LYMPHSABS 0.7 - 4.0 K/uL - - -     Body mass index is 27.25 kg/m.  Orders:  No orders of the defined types were placed in this encounter.  Meds ordered this encounter  Medications  . oxyCODONE-acetaminophen (PERCOCET/ROXICET) 5-325 MG tablet    Sig: Take 1 tablet by mouth every 8 (eight) hours as needed for severe pain.    Dispense:  30 tablet    Refill:  0     Procedures: No procedures performed  Clinical Data: No additional findings.  ROS:  All other systems negative, except as noted in the HPI. Review of Systems  Objective: Vital Signs: Ht 6\' 3"  (1.905 m)   Wt 218 lb (98.9 kg)   BMI 27.25 kg/m   Specialty Comments:  No specialty comments available.  PMFS History: Patient Active Problem List   Diagnosis Date Noted  . Positive for macroalbuminuria 10/24/2019  . Amputation of left great toe (  HCC) 10/23/2019  . Tobacco dependence 10/23/2019  . Osteomyelitis of great toe of right foot (HCC) 06/04/2019  . Diabetic foot infection (HCC)   . Noncompliance with medication regimen   . Glaucoma   . Hyperlipidemia   . Hypertension 11/06/2003  . Diabetes mellitus (HCC) 11/05/1997   Past Medical History:  Diagnosis Date  . Dehiscence of amputation stump (HCC)    left great toe  . Diabetes mellitus without complication (HCC) 1999   Type II  . Glaucoma 2015  . Hyperlipidemia   . Hypertension 2005  . Osteomyelitis (HCC)   . Wears glasses     Family History  Problem Relation Age of Onset  . Stroke Mother   . Diabetes Mother        Toward end of life    Past Surgical History:  Procedure Laterality Date  . AMPUTATION Left 06/05/2019   Procedure: LEFT GREAT TOE AMPUTATION;  Surgeon: [name], MD;  Location: [facility] OR;  Service: Orthopedics;  Laterality: Left;  . AMPUTATION Left 07/10/2019   Procedure: LEFT FOOT 1ST RAY AMPUTATION;  Surgeon: [name], MD;  Location: [facility] OR;  Service: Orthopedics;  Laterality: Left;  . AMPUTATION Right 05/25/2020   Procedure: RIGHT GREAT TOE AMPUTATION;  Surgeon: [name], MD;  Location: [facility] OR;  Service: Orthopedics;  Laterality: Right;  . NO PAST SURGERIES     Social History   Occupational History  . Not on file  Tobacco Use  . Smoking status: Current Every Day Smoker    Types: Cigars  . Smokeless tobacco: Former Neurosurgeon    Types: Chew  . Tobacco comment: 6 daily  Vaping Use  . Vaping Use: Never used  Substance and Sexual Activity  . Alcohol use: Yes    Comment: occasional  . Drug use: Yes    Types: Marijuana    Comment: ocassional - last time 05/08/2020  . Sexual activity: Not on file

## 2020-06-17 ENCOUNTER — Telehealth: Payer: Self-pay | Admitting: Orthopedic Surgery

## 2020-06-17 NOTE — Telephone Encounter (Signed)
Patient called.   He was given a rx for the [facility] and lost it. He is requesting a call back to see what he needs to do.   Call back: [phone]

## 2020-06-17 NOTE — Telephone Encounter (Signed)
I called and advised pt that we can write another one at his appt on Monday. Voiced understanding will call with any other questions.

## 2020-06-20 ENCOUNTER — Encounter: Payer: Self-pay | Admitting: Internal Medicine

## 2020-06-20 ENCOUNTER — Other Ambulatory Visit: Payer: Self-pay

## 2020-06-20 ENCOUNTER — Ambulatory Visit: Payer: 59 | Attending: Internal Medicine | Admitting: Internal Medicine

## 2020-06-20 ENCOUNTER — Encounter: Payer: Self-pay | Admitting: Physician Assistant

## 2020-06-20 ENCOUNTER — Ambulatory Visit (INDEPENDENT_AMBULATORY_CARE_PROVIDER_SITE_OTHER): Payer: 59 | Admitting: Physician Assistant

## 2020-06-20 VITALS — Ht 75.0 in | Wt 193.0 lb

## 2020-06-20 VITALS — BP 171/92 | HR 91 | Temp 98.3°F | Resp 16 | Wt 193.8 lb

## 2020-06-20 DIAGNOSIS — E782 Mixed hyperlipidemia: Secondary | ICD-10-CM

## 2020-06-20 DIAGNOSIS — M869 Osteomyelitis, unspecified: Secondary | ICD-10-CM

## 2020-06-20 DIAGNOSIS — I10 Essential (primary) hypertension: Secondary | ICD-10-CM

## 2020-06-20 DIAGNOSIS — E1169 Type 2 diabetes mellitus with other specified complication: Secondary | ICD-10-CM

## 2020-06-20 DIAGNOSIS — N521 Erectile dysfunction due to diseases classified elsewhere: Secondary | ICD-10-CM

## 2020-06-20 DIAGNOSIS — E119 Type 2 diabetes mellitus without complications: Secondary | ICD-10-CM

## 2020-06-20 DIAGNOSIS — E1121 Type 2 diabetes mellitus with diabetic nephropathy: Secondary | ICD-10-CM | POA: Diagnosis not present

## 2020-06-20 DIAGNOSIS — D649 Anemia, unspecified: Secondary | ICD-10-CM

## 2020-06-20 DIAGNOSIS — Z89411 Acquired absence of right great toe: Secondary | ICD-10-CM | POA: Insufficient documentation

## 2020-06-20 LAB — POCT GLYCOSYLATED HEMOGLOBIN (HGB A1C): HbA1c, POC (controlled diabetic range): 7.1 % — AB (ref 0.0–7.0)

## 2020-06-20 LAB — GLUCOSE, POCT (MANUAL RESULT ENTRY): POC Glucose: 237 mg/dl — AB (ref 70–99)

## 2020-06-20 MED ORDER — LISINOPRIL 10 MG PO TABS: 10.0000 mg | ORAL_TABLET | Freq: Every day | ORAL | 5 refills | Status: DC

## 2020-06-20 MED ORDER — ATORVASTATIN CALCIUM 10 MG PO TABS: 10.0000 mg | ORAL_TABLET | Freq: Every day | ORAL | 6 refills | Status: DC

## 2020-06-20 MED ORDER — METFORMIN HCL 1000 MG PO TABS: ORAL_TABLET | ORAL | 6 refills | Status: DC

## 2020-06-20 MED ORDER — TADALAFIL 5 MG PO TABS: ORAL_TABLET | ORAL | 3 refills | Status: DC

## 2020-06-20 MED ORDER — OXYCODONE-ACETAMINOPHEN 5-325 MG PO TABS: 1.0000 | ORAL_TABLET | Freq: Three times a day (TID) | ORAL | 0 refills | Status: DC | PRN

## 2020-06-20 NOTE — Progress Notes (Signed)
  Patient ID: Scott Greer, male    DOB: 08/01/1956  MRN: [id number]  CC: Diabetes and Hypertension   Subjective: Scott Greer is a 63 y.o. male who presents for chronic ds management.  Last seen 10/2019. His concerns today include:   Pt with hx of HTN, DM type 2, HL, glaucoma LT eye, tob dep, amp LT big toe  Had amputation RT big toe 3 wks ago due to osteomyelitis.  Patient states that it became infected after he accidentally bumped his toe against concrete several weeks prior. Recently had all except 2 stiches removed because he was still having a little drainage.  Has follow-up appointment with orthopedics this afternoon at 4 PM. On crutches today.   DIABETES TYPE 2 Last A1C:   Results for orders placed or performed in visit on 06/20/20  POCT glucose (manual entry)  Result Value Ref Range   POC Glucose 237 (A) 70 - 99 mg/dl  POCT glycosylated hemoglobin (Hb A1C)  Result Value Ref Range   Hemoglobin A1C     HbA1c POC (<> result, manual entry)     HbA1c, POC (prediabetic range)     HbA1c, POC (controlled diabetic range) 7.1 (A) 0.0 - 7.0 %    Med Adherence:  [x] Yes   But only taking Metformin once a day instead of twice daily as prescribed. Medication side effects:  [] Yes    [x] No Home Monitoring?  [x] Yes  -but not often Home glucose results range: Diet Adherence: [x] Yes     Exercise: [] Yes    [x] No due to the fact that he is partially nonweightbearing on the right foot due to recent amputation of the big toe. Hypoglycemic episodes?: [] Yes    [x] No Numbness of the feet? [] Yes    [] No Retinopathy hx? [] Yes    [x] No Last eye exam: Overdue for eye exam.  He now has insurance and requests referral. Comments:  HTN:  Started oin Lisinopril last visit.  However today he tells me that he has been out of the medication for a while.  He had almost 3 g of protein in the urine when checked 10/2019.    Complains of problems getting and maintaining erection.  He  attributes it to his medications.  Tells me that he had seen a doctor in the past and was placed on Viagra but did not find that helpful.  HM:  Had 1st dose of COVID vaccine 06/02/20 Patient Active Problem List   Diagnosis Date Noted  . Positive for macroalbuminuria 10/24/2019  . Amputation of left great toe (HCC) 10/23/2019  . Tobacco dependence 10/23/2019  . Osteomyelitis of great toe of right foot (HCC) 06/04/2019  . Diabetic foot infection (HCC)   . Noncompliance with medication regimen   . Glaucoma   . Hyperlipidemia   . Hypertension 11/06/2003  . Diabetes mellitus (HCC) 11/05/1997     Current Outpatient Medications on File Prior to Visit  Medication Sig Dispense Refill  . Blood Glucose Monitoring Suppl (TRUE METRIX METER) w/Device KIT Use as directed 1 kit 0  . doxycycline (VIBRA-TABS) 100 MG tablet Take 1 tablet (100 mg total) by mouth 2 (two) times daily. 20 tablet 0  . glucose blood (TRUE METRIX BLOOD GLUCOSE TEST) test strip Use as instructed 100 each 12  . TRUEplus Lancets 28G MISC Use as directed 100 each 4   No current facility-administered medications on file prior to visit.      Allergies  Allergen Reactions  . Bee Venom Hives, Itching and Swelling    Social History   Socioeconomic History  . Marital status: Legally Separated    Spouse name: Tawana  . Number of children: 2  . Years of education: Not on file  . Highest education level: Associate degree: occupational, technical, or vocational program  Occupational History  . Not on file  Tobacco Use  . Smoking status: Current Every Day Smoker    Types: Cigars  . Smokeless tobacco: Former User    Types: Chew  . Tobacco comment: 6 daily  Vaping Use  . Vaping Use: Never used  Substance and Sexual Activity  . Alcohol use: Yes    Comment: occasional  . Drug use: Yes    Types: Marijuana    Comment: ocassional - last time 05/08/2020  . Sexual activity: Not on file  Other Topics Concern  . Not on file    Social History Narrative   Out of prison for 2 months.   Lives at home with his wife.   Social Determinants of Health   Financial Resource Strain:   . Difficulty of Paying Living Expenses:   Food Insecurity:   . Worried About Running Out of Food in the Last Year:   . Ran Out of Food in the Last Year:   Transportation Needs:   . Lack of Transportation (Medical):   . Lack of Transportation (Non-Medical):   Physical Activity:   . Days of Exercise per Week:   . Minutes of Exercise per Session:   Stress:   . Feeling of Stress :   Social Connections:   . Frequency of Communication with Friends and Family:   . Frequency of Social Gatherings with Friends and Family:   . Attends Religious Services:   . Active Member of Clubs or Organizations:   . Attends Club or Organization Meetings:   . Marital Status:   Intimate Partner Violence:   . Fear of Current or Ex-Partner:   . Emotionally Abused:   . Physically Abused:   . Sexually Abused:     Family History  Problem Relation Age of Onset  . Stroke Mother   . Diabetes Mother        Toward end of Greer    Past Surgical History:  Procedure Laterality Date  . AMPUTATION Left 06/05/2019   Procedure: LEFT GREAT TOE AMPUTATION;  Surgeon: Duda, Marcus V, MD;  Location: [facility] OR;  Service: Orthopedics;  Laterality: Left;  . AMPUTATION Left 07/10/2019   Procedure: LEFT FOOT 1ST RAY AMPUTATION;  Surgeon: Duda, Marcus V, MD;  Location: [facility] OR;  Service: Orthopedics;  Laterality: Left;  . AMPUTATION Right 05/25/2020   Procedure: RIGHT GREAT TOE AMPUTATION;  Surgeon: Duda, Marcus V, MD;  Location: [facility] OR;  Service: Orthopedics;  Laterality: Right;  . NO PAST SURGERIES      ROS: Review of Systems Negative except as stated above  PHYSICAL EXAM: BP (!) 171/92   Pulse 91   Temp 98.3 F (36.8 C)   Resp 16   Wt 193 lb 12.8 oz (87.9 kg)   SpO2 99%   BMI 24.22 kg/m   Physical Exam  General appearance - alert, well appearing, and in no  distress Mental status - normal mood, behavior, speech, dress, motor activity, and thought processes Neck - supple, no significant adenopathy Chest - clear to auscultation, no wheezes, rales or rhonchi, symmetric air entry Heart - normal rate, regular rhythm, normal S1, S2, no   murmurs, rubs, clicks or gallops Extremities -no lower extremity edema Diabetic Foot Exam - Simple   Simple Foot Form Visual Inspection See comments: Yes Sensation Testing See comments: Yes Pulse Check See comments: Yes Comments Right foot: He has had amputation of the big toe.  He has one visible suture noted but otherwise the wound appears to be healed.  He has decreased dorsalis pedis pulses bilaterally.  He has decreased sensation on palpation on the soles of both feet.  He has amputation of the left big toe.     CMP Latest Ref Rng & Units 05/25/2020 10/23/2019 07/10/2019  Glucose 70 - 99 mg/dL 146(H) 298(H) 88  BUN 8 - 23 mg/dL _0 Creatinine 0.61 - 1.24 mg/dL 1.28(H) 1.26 1.28(H)  Sodium 135 - 145 mmol/L 134(L) 137 137  Potassium 3.5 - 5.1 mmol/L 3.9 4.9 4.4  Chloride 98 - 111 mmol/L 97(L) 98 104  CO2 22 - 32 mmol/L _1 Calcium 8.9 - 10.3 mg/dL 9.4 9.9 9.2  Total Protein 6.0 - 8.5 g/dL - 6.3 -  Total Bilirubin 0.0 - 1.2 mg/dL - 0.3 -  Alkaline Phos 39 - 117 IU/L - 77 -  AST 0 - 40 IU/L - 8 -  ALT 0 - 44 IU/L - 10 -   Lipid Panel     Component Value Date/Time   CHOL 177 10/23/2019 1526   TRIG 157 (H) 10/23/2019 1526   HDL 65 10/23/2019 1526   CHOLHDL 2.7 10/23/2019 1526   LDLCALC 85 10/23/2019 1526    CBC    Component Value Date/Time   WBC 10.1 05/25/2020 1336   RBC 3.73 (L) 05/25/2020 1336   HGB 11.4 (L) 05/25/2020 1336   HCT 34.5 (L) 05/25/2020 1336   PLT 366 05/25/2020 1336   MCV 92.5 05/25/2020 1336   MCH 30.6 05/25/2020 1336   MCHC 33.0 05/25/2020 1336   RDW 12.8 05/25/2020 1336   LYMPHSABS 1.2 06/03/2019 2158   MONOABS 0.9 06/03/2019 2158   EOSABS 0.1 06/03/2019 2158    BASOSABS 0.0 06/03/2019 2158    ASSESSMENT AND PLAN: 1. Type 2 diabetes mellitus with diabetic nephropathy, without long-term current use of insulin ([employer]) Close to goal.  He is supposed to be on Metformin 1 g twice a day but he has only been taking it once a day.  Advised him to take 1 g in the morning and half a tablet in the evening.  Healthy eating habits discussed and encouraged. -Encouraged him to take lisinopril every day.  Informed him of the significant amount of protein in the urine which is an indication of diabetes affecting the kidneys. - POCT glucose (manual entry) - POCT glycosylated hemoglobin (Hb A1C) - Microalbumin / creatinine urine ratio - Ambulatory referral to Ophthalmology - metFORMIN (GLUCOPHAGE) 1000 MG tablet; 1 tab PO Q a.m and 1/2 tab PO Q p.m  Dispense: 45 tablet; Refill: 6  2. Essential hypertension Not at goal.  Restart lisinopril. - lisinopril (ZESTRIL) 10 MG tablet; Take 1 tablet (10 mg total) by mouth daily.  Dispense: 30 tablet; Refill: 5  3. Mixed hyperlipidemia - atorvastatin (LIPITOR) 10 MG tablet; Take 1 tablet (10 mg total) by mouth daily.  Dispense: 30 tablet; Refill: 6  4. Erectile dysfunction associated with type 2 diabetes mellitus ([place]) He is willing to try Cialis.  I went over how the medication work.  Advised patient to be seen in the emergency room if he has an erection lasting longer  than 2 hours or has sudden vision or hearing changes. - tadalafil (CIALIS) 5 MG tablet; 2 tabs 1/2 hr before sex as needed.  Limit use to 2 tabs/24 hrs  Dispense: 20 tablet; Refill: 3  5. Normocytic anemia - CBC - Iron, TIBC and Ferritin Panel  6. Amputee, great toe, right (HCC) Followed by orthopedics.     Patient was given the opportunity to ask questions.  Patient verbalized understanding of the plan and was able to repeat key elements of the plan.   Orders Placed This Encounter  Procedures  . Microalbumin / creatinine urine ratio  . CBC  .  Iron, TIBC and Ferritin Panel  . Ambulatory referral to Ophthalmology  . POCT glucose (manual entry)  . POCT glycosylated hemoglobin (Hb A1C)     Requested Prescriptions   Signed Prescriptions Disp Refills  . metFORMIN (GLUCOPHAGE) 1000 MG tablet 45 tablet 6    Sig: 1 tab PO Q a.m and 1/2 tab PO Q p.m  . atorvastatin (LIPITOR) 10 MG tablet 30 tablet 6    Sig: Take 1 tablet (10 mg total) by mouth daily.  . lisinopril (ZESTRIL) 10 MG tablet 30 tablet 5    Sig: Take 1 tablet (10 mg total) by mouth daily.  . tadalafil (CIALIS) 5 MG tablet 20 tablet 3    Sig: 2 tabs 1/2 hr before sex as needed.  Limit use to 2 tabs/24 hrs    Return in about 3 months (around 09/20/2020).  Deborah Johnson, MD, FACP 

## 2020-06-20 NOTE — Progress Notes (Signed)
Office Visit Note   Patient: Scott Greer           Date of Birth: [birth year]           MRN: [id number] Visit Date: 06/20/2020              Requested by: [name], MD [address] [place],  [place] [address] PCP: [name], MD  Chief Complaint  Patient presents with  . Right Foot - Routine Post Op    05/25/20 right GT amputation       HPI: This is a pleasant gentleman who is 1 month status post right great toe amputation.  He is also status post ray amputation on the left foot.  He is doing okay.  He is requesting some more pain medication.  He complains of pain in his left foot because of off weighting to the lateral side of his foot from his ray amputation.  He did receive a prescription for orthotics for [facility] but unfortunately has lost this  Assessment & Plan: Visit Diagnoses: No diagnosis found.  Plan: New prescription for extra-depth shoes with fillers and carbon fiber plates were written today.  Patient will follow up in 2 weeks.  Remaining suture was removed  Follow-Up Instructions: No follow-ups on file.   Ortho Exam  Patient is alert, oriented, no adenopathy, well-dressed, normal affect, normal respiratory effort. Focused examination demonstrates well-healed great toe amputation.  No cellulitis no swelling no fluctuance.  No evident dense of any current infection.  Imaging: No results found. No images are attached to the encounter.  Labs: Lab Results  Component Value Date   HGBA1C 7.1 (A) 06/20/2020   HGBA1C 8.9 (A) 10/23/2019   HGBA1C 10.3 (H) 06/04/2019   ESRSEDRATE 78 (H) 06/04/2019   CRP 4.5 (H) 06/04/2019   REPTSTATUS 06/10/2019 FINAL 06/05/2019   CULT  06/05/2019    NO GROWTH 5 DAYS Performed at [facility], [address] [place], [place] [address]      Lab Results  Component Value Date   ALBUMIN 3.8 10/23/2019   ALBUMIN 3.4 (L) 06/03/2019   ALBUMIN 3.6 05/14/2019   PREALBUMIN 13.8 (L) 06/04/2019    No  results found for: MG No results found for: VD25OH  Lab Results  Component Value Date   PREALBUMIN 13.8 (L) 06/04/2019   CBC EXTENDED Latest Ref Rng & Units 05/25/2020 07/10/2019 06/08/2019  WBC 4.0 - 10.5 K/uL 10.1 6.8 6.1  RBC 4.22 - 5.81 MIL/uL 3.73(L) 3.90(L) 3.49(L)  HGB 13.0 - 17.0 g/dL 11.4(L) 11.2(L) 10.1(L)  HCT 39 - 52 % 34.5(L) 34.7(L) 30.1(L)  PLT 150 - 400 K/uL 366 273 242  NEUTROABS 1.7 - 7.7 K/uL - - -  LYMPHSABS 0.7 - 4.0 K/uL - - -     Body mass index is 24.12 kg/m.  Orders:  No orders of the defined types were placed in this encounter.  No orders of the defined types were placed in this encounter.    Procedures: No procedures performed  Clinical Data: No additional findings.  ROS:  All other systems negative, except as noted in the HPI. Review of Systems  Objective: Vital Signs: Ht 6\' 3"  (1.905 m)   Wt 193 lb (87.5 kg)   BMI 24.12 kg/m   Specialty Comments:  No specialty comments available.  PMFS History: Patient Active Problem List   Diagnosis Date Noted  . Positive for macroalbuminuria 10/24/2019  . Amputation of left great toe (HCC)  10/23/2019  . Tobacco dependence 10/23/2019  . Osteomyelitis of great toe of right foot (HCC) 06/04/2019  . Diabetic foot infection (HCC)   . Noncompliance with medication regimen   . Glaucoma   . Hyperlipidemia   . Hypertension 11/06/2003  . Diabetes mellitus (HCC) 11/05/1997   Past Medical History:  Diagnosis Date  . Dehiscence of amputation stump (HCC)    left great toe  . Diabetes mellitus without complication (HCC) 1999   Type II  . Glaucoma 2015  . Hyperlipidemia   . Hypertension 2005  . Osteomyelitis (HCC)   . Wears glasses     Family History  Problem Relation Age of Onset  . Stroke Mother   . Diabetes Mother        Toward end of life    Past Surgical History:  Procedure Laterality Date  . AMPUTATION Left 06/05/2019   Procedure: LEFT GREAT TOE AMPUTATION;  Surgeon: [name], MD;   Location: [facility] OR;  Service: Orthopedics;  Laterality: Left;  . AMPUTATION Left 07/10/2019   Procedure: LEFT FOOT 1ST RAY AMPUTATION;  Surgeon: [name], MD;  Location: [facility] OR;  Service: Orthopedics;  Laterality: Left;  . AMPUTATION Right 05/25/2020   Procedure: RIGHT GREAT TOE AMPUTATION;  Surgeon: [name], MD;  Location: [facility] OR;  Service: Orthopedics;  Laterality: Right;  . NO PAST SURGERIES     Social History   Occupational History  . Not on file  Tobacco Use  . Smoking status: Current Every Day Smoker    Types: Cigars  . Smokeless tobacco: Former Neurosurgeon    Types: Chew  . Tobacco comment: 6 daily  Vaping Use  . Vaping Use: Never used  Substance and Sexual Activity  . Alcohol use: Yes    Comment: occasional  . Drug use: Yes    Types: Marijuana    Comment: ocassional - last time 05/08/2020  . Sexual activity: Not on file

## 2020-06-21 ENCOUNTER — Telehealth: Payer: Self-pay | Admitting: Orthopedic Surgery

## 2020-06-21 LAB — CBC
Hematocrit: 35.2 % — ABNORMAL LOW (ref 37.5–51.0)
Hemoglobin: 11.4 g/dL — ABNORMAL LOW (ref 13.0–17.7)
MCH: 29.1 pg (ref 26.6–33.0)
MCHC: 32.4 g/dL (ref 31.5–35.7)
MCV: 90 fL (ref 79–97)
Platelets: 327 10*3/uL (ref 150–450)
RBC: 3.92 x10E6/uL — ABNORMAL LOW (ref 4.14–5.80)
RDW: 13.8 % (ref 11.6–15.4)
WBC: 7.6 10*3/uL (ref 3.4–10.8)

## 2020-06-21 LAB — IRON,TIBC AND FERRITIN PANEL
Ferritin: 160 ng/mL (ref 30–400)
Iron Saturation: 25 % (ref 15–55)
Iron: 65 ug/dL (ref 38–169)
Total Iron Binding Capacity: 262 ug/dL (ref 250–450)
UIBC: 197 ug/dL (ref 111–343)

## 2020-06-21 MED FILL — TADALAFIL 5 MG TABS: 5 | 30 days supply | Qty: 30 | Fill #0

## 2020-06-21 NOTE — Telephone Encounter (Signed)
Received vm from [place] w/ [facility] checking status of a request for records. IC,lmvm advised we have not received a request. Left our fax number and address. Ph [phone]

## 2020-06-22 ENCOUNTER — Telehealth: Payer: Self-pay

## 2020-06-22 ENCOUNTER — Encounter: Payer: Self-pay | Admitting: Internal Medicine

## 2020-06-22 DIAGNOSIS — D638 Anemia in other chronic diseases classified elsewhere: Secondary | ICD-10-CM | POA: Insufficient documentation

## 2020-06-22 NOTE — Progress Notes (Signed)
Let patient know that he has a mild but stable anemia most likely due to his underlying chronic medical conditions.

## 2020-06-22 NOTE — Telephone Encounter (Signed)
Contacted pt to go over lab results pt didn't answer and was unable to lvm due to vm being full  

## 2020-06-23 ENCOUNTER — Telehealth: Payer: Self-pay | Admitting: Physician Assistant

## 2020-06-23 NOTE — Telephone Encounter (Signed)
Patient called advised he was told by the pharmacy that he's Rx couldn't be refilled until today. Patient said he want to be sure that he can pick up his Rx today. Patient asked if the pharmacy could be called before he go all the way down there to pick up the Rx? The number to contact patient is [phone]

## 2020-06-23 NOTE — Telephone Encounter (Signed)
It was too early to pick up pain medication. Can pick up today.

## 2020-06-27 ENCOUNTER — Telehealth: Payer: Self-pay | Admitting: *Deleted

## 2020-06-27 MED FILL — ATORVASTATIN 10 MG TABLET: 10 | 30 days supply | Qty: 30 | Fill #1

## 2020-06-27 MED FILL — metFORMIN HCL 1000 MG TABS: 1000 | 30 days supply | Qty: 60 | Fill #1

## 2020-06-27 MED FILL — LISINOPRIL 10 MG TABS: 10 | 30 days supply | Qty: 30 | Fill #1

## 2020-06-27 NOTE — Telephone Encounter (Signed)
Patient dropped off [facility] forms to be completed. I placed form in Dr Henriette Combs box

## 2020-06-29 NOTE — Telephone Encounter (Signed)
Forms has been placed in pcp faxed folder

## 2020-07-04 ENCOUNTER — Encounter: Payer: Self-pay | Admitting: Physician Assistant

## 2020-07-04 ENCOUNTER — Telehealth: Payer: Self-pay | Admitting: Internal Medicine

## 2020-07-04 ENCOUNTER — Ambulatory Visit (INDEPENDENT_AMBULATORY_CARE_PROVIDER_SITE_OTHER): Payer: 59 | Admitting: Physician Assistant

## 2020-07-04 DIAGNOSIS — S98112A Complete traumatic amputation of left great toe, initial encounter: Secondary | ICD-10-CM

## 2020-07-04 DIAGNOSIS — Z89412 Acquired absence of left great toe: Secondary | ICD-10-CM

## 2020-07-04 MED ORDER — OXYCODONE-ACETAMINOPHEN 5-325 MG PO TABS: 1.0000 | ORAL_TABLET | Freq: Three times a day (TID) | ORAL | 0 refills | Status: DC | PRN

## 2020-07-04 NOTE — Telephone Encounter (Signed)
Patient requested forms to be faxed to [phone].

## 2020-07-04 NOTE — Telephone Encounter (Signed)
Patient called and was read lab result note by dr Laural Benes written 06/22/20. He verbalized understanding. He has additional request. Needs to know if his paperwork has gone to [employer]. Will rout request to office to inform patient.

## 2020-07-04 NOTE — Telephone Encounter (Signed)
Provider is still working on paperwork. Once completed it will be faxed

## 2020-07-04 NOTE — Progress Notes (Signed)
Office Visit Note   Patient: Scott Greer           Date of Birth: 04/26/1956           MRN: [id number] Visit Date: 07/04/2020              Requested by: [name], MD [address] [place],  [place] [address] PCP: [name], MD  Chief Complaint  Patient presents with  . Right Foot - Pain  . Left Foot - Pain      HPI: This is a pleasant gentleman who is on the right status post great toe amputation on the left first ray amputation.  He continues to wait for his shoes.  These have to be prescribed to [facility] by his primary care provider.  He thinks the order has been faxed to [facility].  He states is very difficult for him to walk especially on the left side because he feels his foot is not supported.  It is causing his lesser toes to be more painful  Assessment & Plan: Visit Diagnoses: No diagnosis found.  Plan: Patient will follow up in 2 weeks.  It is medically necessary imperative that he get his shoes.  He is going to call [facility] today to be sure that they have received the order.  Follow-Up Instructions: No follow-ups on file.   Ortho Exam  Patient is alert, oriented, no adenopathy, well-dressed, normal affect, normal respiratory effort. Right foot overall well healed surgical incision nontender to palpation no dehiscence no foul odor no cellulitis there is some callusing over the incision on the left side left first ray amputation is completely healed without any swelling pain to palpation he does have clawing of the lesser toes.  No necrotic necrosis noted of the lesser toes no tenderness with manipulation no foul odor Imaging: No results found. No images are attached to the encounter.  Labs: Lab Results  Component Value Date   HGBA1C 7.1 (A) 06/20/2020   HGBA1C 8.9 (A) 10/23/2019   HGBA1C 10.3 (H) 06/04/2019   ESRSEDRATE 78 (H) 06/04/2019   CRP 4.5 (H) 06/04/2019   REPTSTATUS 06/10/2019 FINAL 06/05/2019   CULT  06/05/2019    NO GROWTH 5  DAYS Performed at [facility], [address], [place], [place] [address]      Lab Results  Component Value Date   ALBUMIN 3.8 10/23/2019   ALBUMIN 3.4 (L) 06/03/2019   ALBUMIN 3.6 05/14/2019   PREALBUMIN 13.8 (L) 06/04/2019    No results found for: MG No results found for: VD25OH  Lab Results  Component Value Date   PREALBUMIN 13.8 (L) 06/04/2019   CBC EXTENDED Latest Ref Rng & Units 06/20/2020 05/25/2020 07/10/2019  WBC 3.4 - 10.8 x10E3/uL 7.6 10.1 6.8  RBC 4.14 - 5.80 x10E6/uL 3.92(L) 3.73(L) 3.90(L)  HGB 13.0 - 17.7 g/dL 11.4(L) 11.4(L) 11.2(L)  HCT 37.5 - 51.0 % 35.2(L) 34.5(L) 34.7(L)  PLT 150 - 450 x10E3/uL 327 366 273  NEUTROABS 1.7 - 7.7 K/uL - - -  LYMPHSABS 0.7 - 4.0 K/uL - - -     There is no height or weight on file to calculate BMI.  Orders:  No orders of the defined types were placed in this encounter.  Meds ordered this encounter  Medications  . oxyCODONE-acetaminophen (PERCOCET/ROXICET) 5-325 MG tablet    Sig: Take 1 tablet by mouth every 8 (eight) hours as needed for severe pain.    Dispense:  30  tablet    Refill:  0     Procedures: No procedures performed  Clinical Data: No additional findings.  ROS:  All other systems negative, except as noted in the HPI. Review of Systems  Objective: Vital Signs: There were no vitals taken for this visit.  Specialty Comments:  No specialty comments available.  PMFS History: Patient Active Problem List   Diagnosis Date Noted  . Anemia, chronic disease 06/22/2020  . Amputee, great toe, right (HCC) 06/20/2020  . Normocytic anemia 06/20/2020  . Erectile dysfunction associated with type 2 diabetes mellitus (HCC) 06/20/2020  . Positive for macroalbuminuria 10/24/2019  . Amputation of left great toe (HCC) 10/23/2019  . Tobacco dependence 10/23/2019  . Osteomyelitis of great toe of right foot (HCC) 06/04/2019  . Diabetic foot infection (HCC)   . Noncompliance with medication regimen   .  Glaucoma   . Hyperlipidemia   . Hypertension 11/06/2003  . Diabetes mellitus (HCC) 11/05/1997   Past Medical History:  Diagnosis Date  . Dehiscence of amputation stump (HCC)    left great toe  . Diabetes mellitus without complication (HCC) 1999   Type II  . Glaucoma 2015  . Hyperlipidemia   . Hypertension 2005  . Osteomyelitis (HCC)   . Wears glasses     Family History  Problem Relation Age of Onset  . Stroke Mother   . Diabetes Mother        Toward end of life    Past Surgical History:  Procedure Laterality Date  . AMPUTATION Left 06/05/2019   Procedure: LEFT GREAT TOE AMPUTATION;  Surgeon: [name], MD;  Location: [facility] OR;  Service: Orthopedics;  Laterality: Left;  . AMPUTATION Left 07/10/2019   Procedure: LEFT FOOT 1ST RAY AMPUTATION;  Surgeon: [name], MD;  Location: [facility] OR;  Service: Orthopedics;  Laterality: Left;  . AMPUTATION Right 05/25/2020   Procedure: RIGHT GREAT TOE AMPUTATION;  Surgeon: [name], MD;  Location: [facility] OR;  Service: Orthopedics;  Laterality: Right;  . NO PAST SURGERIES     Social History   Occupational History  . Not on file  Tobacco Use  . Smoking status: Current Every Day Smoker    Types: Cigars  . Smokeless tobacco: Former Neurosurgeon    Types: Chew  . Tobacco comment: 6 daily  Vaping Use  . Vaping Use: Never used  Substance and Sexual Activity  . Alcohol use: Yes    Comment: occasional  . Drug use: Yes    Types: Marijuana    Comment: ocassional - last time 05/08/2020  . Sexual activity: Not on file

## 2020-07-07 NOTE — Telephone Encounter (Signed)
Form has been faxed.

## 2020-07-08 NOTE — Telephone Encounter (Addendum)
Pt calling again to follow up on paperwork.  Pt states he needs his shoes.  Pt has had 2 toes amputated and he really needs to be fitted for the shoes. Please let pt know if this has been done.  [phone]

## 2020-07-12 NOTE — Telephone Encounter (Signed)
Contacted pt and left detailed vm informing pt that forms were faxed September 2.

## 2020-07-18 ENCOUNTER — Ambulatory Visit (INDEPENDENT_AMBULATORY_CARE_PROVIDER_SITE_OTHER): Payer: 59 | Admitting: Physician Assistant

## 2020-07-18 ENCOUNTER — Encounter: Payer: Self-pay | Admitting: Physician Assistant

## 2020-07-18 VITALS — Ht 75.0 in | Wt 193.0 lb

## 2020-07-18 DIAGNOSIS — S98112A Complete traumatic amputation of left great toe, initial encounter: Secondary | ICD-10-CM

## 2020-07-18 DIAGNOSIS — Z89412 Acquired absence of left great toe: Secondary | ICD-10-CM

## 2020-07-18 MED ORDER — OXYCODONE-ACETAMINOPHEN 5-325 MG PO TABS: 1.0000 | ORAL_TABLET | Freq: Three times a day (TID) | ORAL | 0 refills | Status: DC | PRN

## 2020-07-18 NOTE — Progress Notes (Signed)
Office Visit Note   Patient: Scott Greer           Date of Birth: Apr 07, 1956           MRN: [id number] Visit Date: 07/18/2020              Requested by: [name], MD [address] [place],  [place] [address] PCP: [name], MD  Chief Complaint  Patient presents with  . Right Foot - Routine Post Op    05/25/20 right GT amputation       HPI: This is a pleasant gentleman who is 7 weeks status post right great toe amputation.  He is also status post left first ray amputation.  He is gotten his shoes ordered and measured.  He overall is doing well.  He states he has more pain over the left side where the ray amputation was done earlier this year because he feels he does not have good support  Assessment & Plan: Visit Diagnoses: No diagnosis found.  Plan: Follow-up in 1 month sooner if he needs to.  Follow-Up Instructions: No follow-ups on file.   Ortho Exam  Patient is alert, oriented, no adenopathy, well-dressed, normal affect, normal respiratory effort. Right foot no swelling no foul odor no drainage incision is callused but healed well.  No dehiscence.  No cellulitis.  Left foot well-healed first ray amputation.  No cellulitis no drainage no necrosis  Imaging: No results found. No images are attached to the encounter.  Labs: Lab Results  Component Value Date   HGBA1C 7.1 (A) 06/20/2020   HGBA1C 8.9 (A) 10/23/2019   HGBA1C 10.3 (H) 06/04/2019   ESRSEDRATE 78 (H) 06/04/2019   CRP 4.5 (H) 06/04/2019   REPTSTATUS 06/10/2019 FINAL 06/05/2019   CULT  06/05/2019    NO GROWTH 5 DAYS Performed at [facility], [address], [place], [place] [address]      Lab Results  Component Value Date   ALBUMIN 3.8 10/23/2019   ALBUMIN 3.4 (L) 06/03/2019   ALBUMIN 3.6 05/14/2019   PREALBUMIN 13.8 (L) 06/04/2019    No results found for: MG No results found for: VD25OH  Lab Results  Component Value Date   PREALBUMIN 13.8 (L) 06/04/2019    CBC EXTENDED Latest Ref Rng & Units 06/20/2020 05/25/2020 07/10/2019  WBC 3.4 - 10.8 x10E3/uL 7.6 10.1 6.8  RBC 4.14 - 5.80 x10E6/uL 3.92(L) 3.73(L) 3.90(L)  HGB 13.0 - 17.7 g/dL 11.4(L) 11.4(L) 11.2(L)  HCT 37.5 - 51.0 % 35.2(L) 34.5(L) 34.7(L)  PLT 150 - 450 x10E3/uL 327 366 273  NEUTROABS 1.7 - 7.7 K/uL - - -  LYMPHSABS 0.7 - 4.0 K/uL - - -     Body mass index is 24.12 kg/m.  Orders:  No orders of the defined types were placed in this encounter.  Meds ordered this encounter  Medications  . oxyCODONE-acetaminophen (PERCOCET/ROXICET) 5-325 MG tablet    Sig: Take 1 tablet by mouth every 8 (eight) hours as needed for severe pain.    Dispense:  30 tablet    Refill:  0     Procedures: No procedures performed  Clinical Data: No additional findings.  ROS:  All other systems negative, except as noted in the HPI. Review of Systems  Objective: Vital Signs: Ht 6\' 3"  (1.905 m)   Wt 193 lb (87.5 kg)   BMI 24.12 kg/m   Specialty Comments:  No specialty comments available.  PMFS History: Patient Active Problem List  Diagnosis Date Noted  . Anemia, chronic disease 06/22/2020  . Amputee, great toe, right (HCC) 06/20/2020  . Normocytic anemia 06/20/2020  . Erectile dysfunction associated with type 2 diabetes mellitus (HCC) 06/20/2020  . Positive for macroalbuminuria 10/24/2019  . Amputation of left great toe (HCC) 10/23/2019  . Tobacco dependence 10/23/2019  . Osteomyelitis of great toe of right foot (HCC) 06/04/2019  . Diabetic foot infection (HCC)   . Noncompliance with medication regimen   . Glaucoma   . Hyperlipidemia   . Hypertension 11/06/2003  . Diabetes mellitus (HCC) 11/05/1997   Past Medical History:  Diagnosis Date  . Dehiscence of amputation stump (HCC)    left great toe  . Diabetes mellitus without complication (HCC) 1999   Type II  . Glaucoma 2015  . Hyperlipidemia   . Hypertension 2005  . Osteomyelitis (HCC)   . Wears glasses     Family  History  Problem Relation Age of Onset  . Stroke Mother   . Diabetes Mother        Toward end of life    Past Surgical History:  Procedure Laterality Date  . AMPUTATION Left 06/05/2019   Procedure: LEFT GREAT TOE AMPUTATION;  Surgeon: [name], MD;  Location: [facility] OR;  Service: Orthopedics;  Laterality: Left;  . AMPUTATION Left 07/10/2019   Procedure: LEFT FOOT 1ST RAY AMPUTATION;  Surgeon: [name], MD;  Location: [facility] OR;  Service: Orthopedics;  Laterality: Left;  . AMPUTATION Right 05/25/2020   Procedure: RIGHT GREAT TOE AMPUTATION;  Surgeon: [name], MD;  Location: [facility] OR;  Service: Orthopedics;  Laterality: Right;  . NO PAST SURGERIES     Social History   Occupational History  . Not on file  Tobacco Use  . Smoking status: Current Every Day Smoker    Types: Cigars  . Smokeless tobacco: Former Neurosurgeon    Types: Chew  . Tobacco comment: 6 daily  Vaping Use  . Vaping Use: Never used  Substance and Sexual Activity  . Alcohol use: Yes    Comment: occasional  . Drug use: Yes    Types: Marijuana    Comment: ocassional - last time 05/08/2020  . Sexual activity: Not on file

## 2020-08-15 ENCOUNTER — Ambulatory Visit: Payer: 59 | Admitting: Physician Assistant

## 2020-09-05 DIAGNOSIS — Z419 Encounter for procedure for purposes other than remedying health state, unspecified: Secondary | ICD-10-CM | POA: Diagnosis not present

## 2020-09-20 ENCOUNTER — Other Ambulatory Visit: Payer: Self-pay

## 2020-09-20 ENCOUNTER — Ambulatory Visit: Payer: 59 | Attending: Internal Medicine | Admitting: Internal Medicine

## 2020-10-05 DIAGNOSIS — Z419 Encounter for procedure for purposes other than remedying health state, unspecified: Secondary | ICD-10-CM | POA: Diagnosis not present

## 2020-11-05 DIAGNOSIS — Z419 Encounter for procedure for purposes other than remedying health state, unspecified: Secondary | ICD-10-CM | POA: Diagnosis not present

## 2020-12-06 DIAGNOSIS — Z419 Encounter for procedure for purposes other than remedying health state, unspecified: Secondary | ICD-10-CM | POA: Diagnosis not present

## 2020-12-13 ENCOUNTER — Ambulatory Visit (INDEPENDENT_AMBULATORY_CARE_PROVIDER_SITE_OTHER): Payer: Self-pay | Admitting: Physician Assistant

## 2020-12-13 ENCOUNTER — Encounter: Payer: Self-pay | Admitting: Physician Assistant

## 2020-12-13 DIAGNOSIS — M79671 Pain in right foot: Secondary | ICD-10-CM

## 2020-12-13 DIAGNOSIS — M79672 Pain in left foot: Secondary | ICD-10-CM

## 2020-12-13 NOTE — Progress Notes (Signed)
Office Visit Note   Patient: Scott Greer           Date of Birth: 07-23-1956           MRN: [id number] Visit Date: 12/13/2020              Requested by: [name], MD [address] [place],  [place] [address] PCP: [name], MD  Chief Complaint  Patient presents with  . Right Foot - Pain  . Left Foot - Pain      HPI: Patient presents today with a chief complaint of bilateral lower extremity swelling.  He does have a history of on the left foot first ray amputation and on the right foot a great toe amputation.  He is here today because he said over the last couple weeks he has developed swelling from his toes all the way up into the thigh.  He also has some swelling in his abdomen.  He admits he has not been his primary provider in several months.  He is also stopped taking his blood pressure medication.  He denies any headache any chest pain he occasionally has exertional shortness of breath.  This is not changed in quality in the last 2 weeks.  Assessment & Plan: Visit Diagnoses: No diagnosis found.  Plan: I had a long conversation with patient.  I think it is imperative that he see a physician in the next 24 hours.  We have called and arranged for him to see a mobile clinic practitioner in person at 930 tomorrow morning.  He understands that he is at high risk for complications if he does not keep this appointment.  If he had any increasing shortness of breath headache or increased swelling he is to go to his nearest emergency room or call 911  Follow-Up Instructions: No follow-ups on file.   Ortho Exam  Patient is alert, oriented, no adenopathy, well-dressed, normal affect, normal respiratory effort. Massive swelling bilateral lower extremities.  His surgical incisions are well closed he does not have any skin breakdown in his calf or his feet.  No gangrenous changes.  No swelling going up into his thighs and in his abdomen.  He does have an elevated blood  pressure to 157/104 is appropriate to conversation and answers questions appropriately appears comfortable  Imaging: No results found. No images are attached to the encounter.  Labs: Lab Results  Component Value Date   HGBA1C 7.1 (A) 06/20/2020   HGBA1C 8.9 (A) 10/23/2019   HGBA1C 10.3 (H) 06/04/2019   ESRSEDRATE 78 (H) 06/04/2019   CRP 4.5 (H) 06/04/2019   REPTSTATUS 06/10/2019 FINAL 06/05/2019   CULT  06/05/2019    NO GROWTH 5 DAYS Performed at [facility], [address], [place], [place] [address]      Lab Results  Component Value Date   ALBUMIN 3.8 10/23/2019   ALBUMIN 3.4 (L) 06/03/2019   ALBUMIN 3.6 05/14/2019   PREALBUMIN 13.8 (L) 06/04/2019    No results found for: MG No results found for: VD25OH  Lab Results  Component Value Date   PREALBUMIN 13.8 (L) 06/04/2019   CBC EXTENDED Latest Ref Rng & Units 06/20/2020 05/25/2020 07/10/2019  WBC 3.4 - 10.8 x10E3/uL 7.6 10.1 6.8  RBC 4.14 - 5.80 x10E6/uL 3.92(L) 3.73(L) 3.90(L)  HGB 13.0 - 17.7 g/dL 11.4(L) 11.4(L) 11.2(L)  HCT 37.5 - 51.0 % 35.2(L) 34.5(L) 34.7(L)  PLT 150 - 450 x10E3/uL 327 366 273  NEUTROABS 1.7 -  7.7 K/uL - - -  LYMPHSABS 0.7 - 4.0 K/uL - - -     There is no height or weight on file to calculate BMI.  Orders:  No orders of the defined types were placed in this encounter.  No orders of the defined types were placed in this encounter.    Procedures: No procedures performed  Clinical Data: No additional findings.  ROS:  All other systems negative, except as noted in the HPI. Review of Systems  Objective: Vital Signs: There were no vitals taken for this visit.  Specialty Comments:  No specialty comments available.  PMFS History: Patient Active Problem List   Diagnosis Date Noted  . Anemia, chronic disease 06/22/2020  . Amputee, great toe, right (HCC) 06/20/2020  . Normocytic anemia 06/20/2020  . Erectile dysfunction associated with type 2 diabetes mellitus (HCC)  06/20/2020  . Positive for macroalbuminuria 10/24/2019  . Amputation of left great toe (HCC) 10/23/2019  . Tobacco dependence 10/23/2019  . Osteomyelitis of great toe of right foot (HCC) 06/04/2019  . Diabetic foot infection (HCC)   . Noncompliance with medication regimen   . Glaucoma   . Hyperlipidemia   . Hypertension 11/06/2003  . Diabetes mellitus (HCC) 11/05/1997   Past Medical History:  Diagnosis Date  . Dehiscence of amputation stump (HCC)    left great toe  . Diabetes mellitus without complication (HCC) 1999   Type II  . Glaucoma 2015  . Hyperlipidemia   . Hypertension 2005  . Osteomyelitis (HCC)   . Wears glasses     Family History  Problem Relation Age of Onset  . Stroke Mother   . Diabetes Mother        Toward end of life    Past Surgical History:  Procedure Laterality Date  . AMPUTATION Left 06/05/2019   Procedure: LEFT GREAT TOE AMPUTATION;  Surgeon: [name], MD;  Location: [facility] OR;  Service: Orthopedics;  Laterality: Left;  . AMPUTATION Left 07/10/2019   Procedure: LEFT FOOT 1ST RAY AMPUTATION;  Surgeon: [name], MD;  Location: [facility] OR;  Service: Orthopedics;  Laterality: Left;  . AMPUTATION Right 05/25/2020   Procedure: RIGHT GREAT TOE AMPUTATION;  Surgeon: [name], MD;  Location: [facility] OR;  Service: Orthopedics;  Laterality: Right;  . NO PAST SURGERIES     Social History   Occupational History  . Not on file  Tobacco Use  . Smoking status: Current Every Day Smoker    Types: Cigars  . Smokeless tobacco: Former Neurosurgeon    Types: Chew  . Tobacco comment: 6 daily  Vaping Use  . Vaping Use: Never used  Substance and Sexual Activity  . Alcohol use: Yes    Comment: occasional  . Drug use: Yes    Types: Marijuana    Comment: ocassional - last time 05/08/2020  . Sexual activity: Not on file

## 2020-12-14 ENCOUNTER — Encounter: Payer: Self-pay | Admitting: Physician Assistant

## 2020-12-14 ENCOUNTER — Other Ambulatory Visit: Payer: Self-pay

## 2020-12-14 ENCOUNTER — Ambulatory Visit (INDEPENDENT_AMBULATORY_CARE_PROVIDER_SITE_OTHER): Payer: Medicaid Other | Admitting: Physician Assistant

## 2020-12-14 VITALS — BP 146/99 | HR 100 | Temp 97.4°F | Resp 18 | Ht 75.0 in | Wt 251.0 lb

## 2020-12-14 DIAGNOSIS — Z9114 Patient's other noncompliance with medication regimen: Secondary | ICD-10-CM

## 2020-12-14 DIAGNOSIS — Z91148 Patient's other noncompliance with medication regimen for other reason: Secondary | ICD-10-CM

## 2020-12-14 DIAGNOSIS — Z1159 Encounter for screening for other viral diseases: Secondary | ICD-10-CM | POA: Diagnosis not present

## 2020-12-14 DIAGNOSIS — D638 Anemia in other chronic diseases classified elsewhere: Secondary | ICD-10-CM

## 2020-12-14 DIAGNOSIS — E1121 Type 2 diabetes mellitus with diabetic nephropathy: Secondary | ICD-10-CM

## 2020-12-14 DIAGNOSIS — S98112A Complete traumatic amputation of left great toe, initial encounter: Secondary | ICD-10-CM | POA: Diagnosis not present

## 2020-12-14 DIAGNOSIS — Z125 Encounter for screening for malignant neoplasm of prostate: Secondary | ICD-10-CM | POA: Diagnosis not present

## 2020-12-14 DIAGNOSIS — E559 Vitamin D deficiency, unspecified: Secondary | ICD-10-CM | POA: Diagnosis not present

## 2020-12-14 DIAGNOSIS — I10 Essential (primary) hypertension: Secondary | ICD-10-CM

## 2020-12-14 DIAGNOSIS — E782 Mixed hyperlipidemia: Secondary | ICD-10-CM | POA: Diagnosis not present

## 2020-12-14 LAB — POCT GLYCOSYLATED HEMOGLOBIN (HGB A1C): Hemoglobin A1C: 8.4 % — AB (ref 4.0–5.6)

## 2020-12-14 MED ORDER — METFORMIN HCL 500 MG PO TABS: 500.0000 mg | ORAL_TABLET | Freq: Two times a day (BID) | ORAL | 1 refills | Status: DC

## 2020-12-14 MED ORDER — LISINOPRIL 10 MG PO TABS: 10.0000 mg | ORAL_TABLET | Freq: Every day | ORAL | 1 refills | Status: DC

## 2020-12-14 MED ORDER — ATORVASTATIN CALCIUM 10 MG PO TABS: 10.0000 mg | ORAL_TABLET | Freq: Every day | ORAL | 1 refills | Status: DC

## 2020-12-14 MED FILL — ATORVASTATIN 10 MG TABLET: 10 | 30 days supply | Qty: 30 | Fill #0

## 2020-12-14 MED FILL — LISINOPRIL 10 MG TABS: 10 | 30 days supply | Qty: 30 | Fill #0

## 2020-12-14 MED FILL — METFORMIN HCL 500 MG TABS: 500 | 30 days supply | Qty: 60 | Fill #0

## 2020-12-14 NOTE — Progress Notes (Signed)
Patient has not taken medication in months. Patient has only had milk today. Patient complains of bilateral leg swelling being present everyday for weeks.  Patient reports no HA's, dizziness, or nausea associated with elevated BP.

## 2020-12-14 NOTE — Patient Instructions (Signed)
I reduced your dosing of Metformin to 500 twice a day.  I encourage you to start by taking 1 in the morning for 1 week, then increase to twice a day.  I encourage you to check your blood glucose levels on a daily basis, keep a written log and bring that with you to your next office visit for further review.  I also want you to restart your cholesterol medication and your blood pressure medication.  We will call you with your lab results  You will follow back up with me in 2 weeks for further review and we are making you an appointment with Dr. Laural Benes.  Please let me know if there is anything else we can do for you  Roney Jaffe, PA-C Physician Assistant [facility] [email or web address]   Blood Glucose Monitoring, Adult Monitoring your blood sugar (glucose) is an important part of managing your diabetes. Blood glucose monitoring involves checking your blood glucose as often as directed and keeping a log or record of your results over time. Checking your blood glucose regularly and keeping a blood glucose log can:  Help you and your health care provider adjust your diabetes management plan as needed, including your medicines or insulin.  Help you understand how food, exercise, illnesses, and medicines affect your blood glucose.  Let you know what your blood glucose is at any time. You can quickly find out if you have low blood glucose (hypoglycemia) or high blood glucose (hyperglycemia). Your health care provider will set individualized treatment goals for you. Your goals will be based on your age, other medical conditions you have, and how you respond to diabetes treatment. Generally, the goal of treatment is to maintain the following blood glucose levels:  Before meals (preprandial): 80-130 mg/dL (6.6-2.9 mmol/L).  After meals (postprandial): below 180 mg/dL (10 mmol/L).  A1C level: less than 7%. Supplies needed:  Blood  glucose meter.  Test strips for your meter. Each meter has its own strips. You must use the strips that came with your meter.  A needle to prick your finger (lancet). Do not use a lancet more than one time.  A device that holds the lancet (lancing device).  A journal or log book to write down your results. How to check your blood glucose Checking your blood glucose 1. Wash your hands for at least 20 seconds with soap and water. 2. Prick the side of your finger (not the tip) with the lancet. Do not use the same finger consecutively. 3. Gently rub the finger until a small drop of blood appears. 4. Follow instructions that come with your meter for inserting the test strip, applying blood to the strip, and using your blood glucose meter. 5. Write down your result and any notes in your log.   Using alternative sites Some meters allow you to use areas of your body other than your finger (alternative sites) to test your blood. The most common alternative sites are the forearm, the thigh, and the palm of your hand. Alternative sites may not be as accurate as the fingers because blood flow is slower in those areas. This means that the result you get may be delayed, and it may be different from the result that you would get from your finger. Use the finger only, and do not use alternative sites, if:  You think you have hypoglycemia.  You sometimes do not know that your blood glucose is getting low (hypoglycemia unawareness). General tips and recommendations Blood glucose  log  Every time you check your blood glucose, write down your result. Also write down any notes about things that may be affecting your blood glucose, such as your diet and exercise for the day. This information can help you and your health care provider: ? Look for patterns in your blood glucose over time. ? Adjust your diabetes management plan as needed.  Check if your meter allows you to download your records to a computer or  if there is an app for the meter. Most glucose meters store a record of glucose readings in the meter.   If you have type 1 diabetes:  Check your blood glucose 4 or more times a day if you are on intensive insulin therapy with multiple daily injections (MDI) or if you are using an insulin pump. Check your blood glucose: ? Before every meal and snack. ? Before bedtime.  Also check your blood glucose: ? If you have symptoms of hypoglycemia. ? After treating low blood glucose. ? Before doing activities that create a risk for injury, like driving or using machinery. ? Before and after exercise. ? Two hours after a meal. ? Occasionally between 2:00 a.m. and 3:00 a.m., as directed.  You may need to check your blood glucose more often, 6-10 times per day, if: ? You have diabetes that is not well controlled. ? You are ill. ? You have a history of severe hypoglycemia. ? You have hypoglycemia unawareness. If you have type 2 diabetes:  Check your blood glucose 2 or more times a day if you take insulin or other diabetes medicines.  Check your blood glucose 4 or more times a day if you are on intensive insulin therapy. Occasionally, you may also need to check your glucose between 2:00 a.m. and 3:00 a.m., as directed.  Also check your blood glucose: ? Before and after exercise. ? Before doing activities that create a risk for injury, like driving or using machinery.  You may need to check your blood glucose more often if: ? Your medicine is being adjusted. ? Your diabetes is not well controlled. ? You are ill. General tips  Make sure you always have your supplies with you.  After you use a few boxes of test strips, adjust (calibrate) your blood glucose meter by following instructions that came with your meter.  If you have questions or need help, all blood glucose meters have a 24-hour hotline phone number available that you can call. Also contact your health care provider with questions or  concerns you may have. Where to find more information  The American Diabetes Association: www.diabetes.org  The Association of Diabetes Care & Education Specialists: www.diabeteseducator.org Contact a health care provider if:  Your blood glucose is at or above 240 mg/dL (56.3 mmol/L) for 2 days in a row.  You have been sick or have had a fever for 2 days or longer, and you are not getting better.  You have any of the following problems for more than 6 hours: ? You cannot eat or drink. ? You have nausea or vomiting. ? You have diarrhea. Get help right away if:  Your blood glucose is lower than 54 mg/dL (3 mmol/L).  You become confused, or you have trouble thinking clearly.  You have difficulty breathing.  You have moderate or large ketone levels in your urine. These symptoms may represent a serious problem that is an emergency. Do not wait to see if the symptoms will go away. Get medical help right  away. Call your local emergency services (911 in the [place] Do not drive yourself to the hospital. Summary  Monitoring your blood glucose is an important part of managing your diabetes.  Blood glucose monitoring involves checking your blood glucose as often as directed and keeping a log or record of your results over time.  Your health care provider will set individualized treatment goals for you. Your goals will be based on your age, other medical conditions you have, and how you respond to diabetes treatment.  Every time you check your blood glucose, write down your result. Also, write down any notes about things that may be affecting your blood glucose, such as your diet and exercise for the day. This information is not intended to replace advice given to you by your health care provider. Make sure you discuss any questions you have with your health care provider. Document Revised: 07/20/2020 Document Reviewed: 07/20/2020 Elsevier Patient Education  2021 [employer].

## 2020-12-14 NOTE — Progress Notes (Signed)
Established Patient Office Visit  Subjective:  Patient ID: Scott Greer, male    DOB: [birth year]  Age: 65 y.o. MRN: [id number]  CC:  Chief Complaint  Patient presents with  . Hypertension  . Diabetes    HPI Scott Greer reports that he was seen at orthopedics yesterday with a chief complaint of bilateral lower extremity swelling.  He does have a history of left foot first ray amputation and on the right foot a great toe amputation.  While at orthopedics, his blood pressure was elevated, and he did admit that he had not been taking his medications as prescribed.  Reports today that he has been frustrated, does not feel that he should have had his toes amputated.  Does state today that he does want to be more compliant to his medications.  Reports that he did restart his Metformin a few weeks ago, states that the 1000 mg dosing "rips his stomach up"   Has not been checking blood glucose levels at home, states he does not like to do that.  Does not have access to a blood pressure cuff.  Denies any chest pain, shortness of breath   Past Medical History:  Diagnosis Date  . Dehiscence of amputation stump (HCC)    left great toe  . Diabetes mellitus without complication (HCC) 1999   Type II  . Glaucoma 2015  . Hyperlipidemia   . Hypertension 2005  . Osteomyelitis (HCC)   . Wears glasses     Past Surgical History:  Procedure Laterality Date  . AMPUTATION Left 06/05/2019   Procedure: LEFT GREAT TOE AMPUTATION;  Surgeon: [name], MD;  Location: [facility] OR;  Service: Orthopedics;  Laterality: Left;  . AMPUTATION Left 07/10/2019   Procedure: LEFT FOOT 1ST RAY AMPUTATION;  Surgeon: [name], MD;  Location: [facility] OR;  Service: Orthopedics;  Laterality: Left;  . AMPUTATION Right 05/25/2020   Procedure: RIGHT GREAT TOE AMPUTATION;  Surgeon: [name], MD;  Location: [facility] OR;  Service: Orthopedics;  Laterality: Right;  . NO PAST SURGERIES      Family History  Problem Relation  Age of Onset  . Stroke Mother   . Diabetes Mother        Toward end of life    Social History   Socioeconomic History  . Marital status: Legally Separated    Spouse name: [name]  . Number of children: 2  . Years of education: Not on file  . Highest education level: Associate degree: occupational, Scientist, product/process development, or vocational program  Occupational History  . Not on file  Tobacco Use  . Smoking status: Current Every Day Smoker    Types: Cigars  . Smokeless tobacco: Former Neurosurgeon    Types: Chew  . Tobacco comment: 6 daily  Vaping Use  . Vaping Use: Never used  Substance and Sexual Activity  . Alcohol use: Yes    Comment: occasional  . Drug use: Yes    Types: Marijuana    Comment: ocassional - last time 05/08/2020  . Sexual activity: Not Currently  Other Topics Concern  . Not on file  Social History Narrative   Out of prison for 2 months.   Lives at home with his wife.   Social Determinants of Health   Financial Resource Strain: Not on file  Food Insecurity: Not on file  Transportation Needs: Not on file  Physical Activity: Not on file  Stress: Not on file  Social Connections: Not on file  Intimate Partner Violence: Not on file    Outpatient Medications Prior to Visit  Medication Sig Dispense Refill  . Blood Glucose Monitoring Suppl (TRUE METRIX METER) w/Device KIT Use as directed 1 kit 0  . glucose blood (TRUE METRIX BLOOD GLUCOSE TEST) test strip Use as instructed 100 each 12  . TRUEplus Lancets 28G MISC Use as directed 100 each 4  . atorvastatin (LIPITOR) 10 MG tablet Take 1 tablet (10 mg total) by mouth daily. 30 tablet 6  . lisinopril (ZESTRIL) 10 MG tablet Take 1 tablet (10 mg total) by mouth daily. 30 tablet 5  . metFORMIN (GLUCOPHAGE) 1000 MG tablet 1 tab PO Q a.m and 1/2 tab PO Q p.m 45 tablet 6  . doxycycline (VIBRA-TABS) 100 MG tablet Take 1 tablet (100 mg total) by mouth 2 (two) times daily. 20 tablet 0  . tadalafil (CIALIS) 5 MG tablet 2 tabs 1/2 hr before  sex as needed.  Limit use to 2 tabs/24 hrs (Patient not taking: Reported on 12/14/2020) 20 tablet 3  . oxyCODONE-acetaminophen (PERCOCET/ROXICET) 5-325 MG tablet Take 1 tablet by mouth every 8 (eight) hours as needed for severe pain. 30 tablet 0   No facility-administered medications prior to visit.    Allergies  Allergen Reactions  . Bee Venom Hives, Itching and Swelling    ROS Review of Systems  Constitutional: Negative.   HENT: Negative.   Eyes: Negative.   Respiratory: Negative for shortness of breath and wheezing.   Cardiovascular: Positive for leg swelling. Negative for chest pain.  Gastrointestinal: Negative.   Endocrine: Negative.   Genitourinary: Negative.   Musculoskeletal: Positive for gait problem.  Skin: Negative.   Allergic/Immunologic: Negative.   Neurological: Negative for dizziness, weakness and headaches.  Hematological: Negative.   Psychiatric/Behavioral: Negative.       Objective:    Physical Exam Vitals and nursing note reviewed.  Constitutional:      General: He is not in acute distress.    Appearance: Normal appearance. He is not ill-appearing.  HENT:     Head: Normocephalic and atraumatic.     Right Ear: External ear normal.     Left Ear: External ear normal.     Nose: Nose normal.     Mouth/Throat:     Mouth: Mucous membranes are moist.     Pharynx: Oropharynx is clear.  Eyes:     Extraocular Movements: Extraocular movements intact.     Conjunctiva/sclera: Conjunctivae normal.     Pupils: Pupils are equal, round, and reactive to light.  Cardiovascular:     Rate and Rhythm: Normal rate and regular rhythm.     Pulses: Normal pulses.     Heart sounds: Normal heart sounds.  Pulmonary:     Effort: Pulmonary effort is normal.     Breath sounds: Normal breath sounds.  Musculoskeletal:     Cervical back: Normal range of motion and neck supple.     Right lower leg: 1+ Edema present.     Left lower leg: 1+ Edema present.  Skin:    General:  Skin is warm and dry.  Neurological:     General: No focal deficit present.     Mental Status: He is alert and oriented to person, place, and time.  Psychiatric:        Mood and Affect: Mood normal.        Thought Content: Thought content normal.        Judgment: Judgment normal.     Comments: Agitated  affect      BP (!) 146/99 (BP Location: Left Arm, Patient Position: Sitting, Cuff Size: Normal)   Pulse 100   Temp (!) 97.4 F (36.3 C) (Oral)   Resp 18   Ht 6\' 3"  (1.905 m)   Wt 251 lb (113.9 kg)   SpO2 97%   BMI 31.37 kg/m  Wt Readings from Last 3 Encounters:  12/14/20 251 lb (113.9 kg)  07/18/20 193 lb (87.5 kg)  06/20/20 193 lb (87.5 kg)     Health Maintenance Due  Topic Date Due  . FOOT EXAM  Never done  . OPHTHALMOLOGY EXAM  Never done  . COLONOSCOPY (Pts 45-41yrs Insurance coverage will need to be confirmed)  Never done  . INFLUENZA VACCINE  06/05/2020    There are no preventive care reminders to display for this patient.  No results found for: TSH Lab Results  Component Value Date   WBC 5.3 12/14/2020   HGB 13.2 12/14/2020   HCT 41.7 12/14/2020   MCV 87 12/14/2020   PLT 278 12/14/2020   Lab Results  Component Value Date   NA 138 12/14/2020   K 5.1 12/14/2020   CO2 28 05/25/2020   GLUCOSE 190 (H) 12/14/2020   BUN 32 (H) 12/14/2020   CREATININE 1.50 (H) 12/14/2020   BILITOT 1.2 12/14/2020   ALKPHOS 257 (H) 12/14/2020   AST 29 12/14/2020   ALT 10 10/23/2019   PROT 6.0 12/14/2020   ALBUMIN 3.7 (L) 12/14/2020   CALCIUM 9.2 12/14/2020   ANIONGAP 9 05/25/2020   Lab Results  Component Value Date   CHOL 125 12/14/2020   Lab Results  Component Value Date   HDL 61 12/14/2020   Lab Results  Component Value Date   LDLCALC 47 12/14/2020   Lab Results  Component Value Date   TRIG 90 12/14/2020   Lab Results  Component Value Date   CHOLHDL 2.0 12/14/2020   Lab Results  Component Value Date   HGBA1C 8.4 (A) 12/14/2020      Assessment &  Plan:   Problem List Items Addressed This Visit      Cardiovascular and Mediastinum   Hypertension   Relevant Medications   atorvastatin (LIPITOR) 10 MG tablet   lisinopril (ZESTRIL) 10 MG tablet     Endocrine   Diabetes mellitus (HCC) - Primary   Relevant Medications   atorvastatin (LIPITOR) 10 MG tablet   lisinopril (ZESTRIL) 10 MG tablet   metFORMIN (GLUCOPHAGE) 500 MG tablet   Other Relevant Orders   HgB A1c (Completed)   Comp. Metabolic Panel (12) (Completed)   CBC with Differential/Platelet (Completed)   Microalbumin / creatinine urine ratio (Completed)   Vitamin D, 25-hydroxy (Completed)     Other   Hyperlipidemia   Relevant Medications   atorvastatin (LIPITOR) 10 MG tablet   lisinopril (ZESTRIL) 10 MG tablet   Other Relevant Orders   Lipid panel (Completed)   Noncompliance with medication regimen   Amputation of left great toe (HCC)   Anemia, chronic disease    Other Visit Diagnoses    Screening PSA (prostate specific antigen)       Relevant Orders   PSA (Completed)   Encounter for HCV screening test for low risk patient       Relevant Orders   HCV Ab w/Rflx to Verification (Completed)   Essential hypertension       Relevant Medications   atorvastatin (LIPITOR) 10 MG tablet   lisinopril (ZESTRIL) 10 MG tablet  1. Type 2 diabetes mellitus with diabetic nephropathy, without long-term current use of insulin (HCC) Patient has been noncompliant to his medications for the past few months, A1c 8.4.  To encourage compliance, will restart Metformin at 500 mg instead of 1000 mg to make it more tolerable.  Patient to follow-up with mobile medicine unit in 2 weeks, appointment also created with Dr. Laural Benes. - HgB A1c - Comp. Metabolic Panel (12) - CBC with Differential/Platelet - Microalbumin / creatinine urine ratio - Vitamin D, 25-hydroxy - metFORMIN (GLUCOPHAGE) 500 MG tablet; Take 1 tablet (500 mg total) by mouth 2 (two) times daily with a meal.  Dispense: 60  tablet; Refill: 1  2. Primary hypertension Resume - lisinopril (ZESTRIL) 10 MG tablet; Take 1 tablet (10 mg total) by mouth daily.  Dispense: 30 tablet; Refill: 1  3. Mixed hyperlipidemia Resume - Lipid panel - atorvastatin (LIPITOR) 10 MG tablet; Take 1 tablet (10 mg total) by mouth daily.  Dispense: 30 tablet; Refill: 1  4. Anemia, chronic disease   5. Noncompliance with medication regimen   6. Amputation of left great toe (HCC)   7. Screening PSA (prostate specific antigen)  - PSA  8. Encounter for HCV screening test for low risk patient  - HCV Ab w/Rflx to Verification   I have reviewed the patient's medical history (PMH, PSH, Social History, Family History, Medications, and allergies) , and have been updated if relevant. I spent 30 minutes reviewing chart and  face to face time with patient.     Meds ordered this encounter  Medications  . atorvastatin (LIPITOR) 10 MG tablet    Sig: Take 1 tablet (10 mg total) by mouth daily.    Dispense:  30 tablet    Refill:  1    Order Specific Question:   Supervising Provider    Answer:   Delford Field, PATRICK E [[id number]]  . lisinopril (ZESTRIL) 10 MG tablet    Sig: Take 1 tablet (10 mg total) by mouth daily.    Dispense:  30 tablet    Refill:  1    Order Specific Question:   Supervising Provider    Answer:   Delford Field, PATRICK E [[id number]]  . metFORMIN (GLUCOPHAGE) 500 MG tablet    Sig: Take 1 tablet (500 mg total) by mouth 2 (two) times daily with a meal.    Dispense:  60 tablet    Refill:  1    Dosing change    Order Specific Question:   Supervising Provider    Answer:   Storm Frisk [[id number]]    Follow-up: Return in about 2 weeks (around 12/28/2020).    Kasandra Knudsen [name], PA-C

## 2020-12-15 LAB — CBC WITH DIFFERENTIAL/PLATELET
Basophils Absolute: 0 10*3/uL (ref 0.0–0.2)
Basos: 0 %
EOS (ABSOLUTE): 0 10*3/uL (ref 0.0–0.4)
Eos: 0 %
Hematocrit: 41.7 % (ref 37.5–51.0)
Hemoglobin: 13.2 g/dL (ref 13.0–17.7)
Immature Grans (Abs): 0 10*3/uL (ref 0.0–0.1)
Immature Granulocytes: 0 %
Lymphocytes Absolute: 0.7 10*3/uL (ref 0.7–3.1)
Lymphs: 13 %
MCH: 27.5 pg (ref 26.6–33.0)
MCHC: 31.7 g/dL (ref 31.5–35.7)
MCV: 87 fL (ref 79–97)
Monocytes Absolute: 0.6 10*3/uL (ref 0.1–0.9)
Monocytes: 11 %
Neutrophils Absolute: 4 10*3/uL (ref 1.4–7.0)
Neutrophils: 76 %
Platelets: 278 10*3/uL (ref 150–450)
RBC: 4.8 x10E6/uL (ref 4.14–5.80)
RDW: 14.1 % (ref 11.6–15.4)
WBC: 5.3 10*3/uL (ref 3.4–10.8)

## 2020-12-15 LAB — COMP. METABOLIC PANEL (12)
AST: 29 IU/L (ref 0–40)
Albumin/Globulin Ratio: 1.6 (ref 1.2–2.2)
Albumin: 3.7 g/dL — ABNORMAL LOW (ref 3.8–4.8)
Alkaline Phosphatase: 257 IU/L — ABNORMAL HIGH (ref 44–121)
BUN/Creatinine Ratio: 21 (ref 10–24)
BUN: 32 mg/dL — ABNORMAL HIGH (ref 8–27)
Bilirubin Total: 1.2 mg/dL (ref 0.0–1.2)
Calcium: 9.2 mg/dL (ref 8.6–10.2)
Chloride: 102 mmol/L (ref 96–106)
Creatinine, Ser: 1.5 mg/dL — ABNORMAL HIGH (ref 0.76–1.27)
GFR calc Af Amer: 57 mL/min/{1.73_m2} — ABNORMAL LOW (ref >59)
GFR calc non Af Amer: 49 mL/min/{1.73_m2} — ABNORMAL LOW (ref >59)
Globulin, Total: 2.3 g/dL (ref 1.5–4.5)
Glucose: 190 mg/dL — ABNORMAL HIGH (ref 65–99)
Potassium: 5.1 mmol/L (ref 3.5–5.2)
Sodium: 138 mmol/L (ref 134–144)
Total Protein: 6 g/dL (ref 6.0–8.5)

## 2020-12-15 LAB — HCV AB W/RFLX TO VERIFICATION: HCV Ab: <0.1 s/co ratio (ref 0.0–0.9)

## 2020-12-15 LAB — LIPID PANEL
Chol/HDL Ratio: 2 ratio (ref 0.0–5.0)
Cholesterol, Total: 125 mg/dL (ref 100–199)
HDL: 61 mg/dL (ref >39)
LDL Chol Calc (NIH): 47 mg/dL (ref 0–99)
Triglycerides: 90 mg/dL (ref 0–149)
VLDL Cholesterol Cal: 17 mg/dL (ref 5–40)

## 2020-12-15 LAB — MICROALBUMIN / CREATININE URINE RATIO
Creatinine, Urine: 192.2 mg/dL
Microalb/Creat Ratio: 2877 mg/g creat — ABNORMAL HIGH (ref 0–29)
Microalbumin, Urine: 5529.6 ug/mL

## 2020-12-15 LAB — HCV INTERPRETATION

## 2020-12-15 LAB — PSA: Prostate Specific Ag, Serum: 1.7 ng/mL (ref 0.0–4.0)

## 2020-12-15 LAB — VITAMIN D 25 HYDROXY (VIT D DEFICIENCY, FRACTURES): Vit D, 25-Hydroxy: 9.2 ng/mL — ABNORMAL LOW (ref 30.0–100.0)

## 2020-12-16 MED ORDER — VITAMIN D (ERGOCALCIFEROL) 1.25 MG (50000 UNIT) PO CAPS: 50000.0000 [IU] | ORAL_CAPSULE | ORAL | 2 refills | Status: DC

## 2020-12-16 MED FILL — VIT D2 1.25 MG (50,000 UNIT: 1.25 MG | 28 days supply | Qty: 4 | Fill #0

## 2020-12-16 NOTE — Addendum Note (Signed)
Addended by: Roney Jaffe on: 12/16/2020 08:17 AM   Modules accepted: Orders

## 2020-12-21 ENCOUNTER — Telehealth: Payer: Self-pay | Admitting: *Deleted

## 2020-12-21 NOTE — Telephone Encounter (Signed)
-----   Message from Roney Jaffe, [place] sent at 12/16/2020  8:17 AM EST ----- Please call patient and let him know that he continues to have elevated microalbumin it is imperative that he began compliance to his medications including the lisinopril . his kidney function has worsened, hopefully this should improve with better blood glucose control.  He does have elevated liver enzyme, will repeat this lab at his office visit in 2 weeks, he does not need to be fasting.  His anemia of chronic disease is stable.  His screenings for hepatitis C and prostate cancer are negative.  His vitamin D is very low, he needs to take 50,000 units once a week for at least the next 12 weeks.  Prescription sent to his pharmacy

## 2020-12-21 NOTE — Telephone Encounter (Signed)
Medical Assistant left message on patient's home and cell voicemail. Voicemail states to give a call back to Olliver Boyadjian with [facility] at [phone]. 

## 2020-12-22 ENCOUNTER — Telehealth: Payer: Self-pay

## 2020-12-22 NOTE — Telephone Encounter (Signed)
Contacted patient by phone to provide updated location and date of appt. LVM for patient to contact [facility] for questions

## 2020-12-25 ENCOUNTER — Inpatient Hospital Stay (HOSPITAL_COMMUNITY)
Admission: EM | Admit: 2020-12-25 | Discharge: 2021-01-05 | DRG: 286 | Disposition: A | Discharge status: Home / Self Care | Payer: Medicaid Other | Attending: Internal Medicine | Admitting: Internal Medicine

## 2020-12-25 ENCOUNTER — Emergency Department (HOSPITAL_COMMUNITY): Payer: Medicaid Other

## 2020-12-25 ENCOUNTER — Encounter (HOSPITAL_COMMUNITY): Payer: Self-pay | Admitting: Emergency Medicine

## 2020-12-25 ENCOUNTER — Other Ambulatory Visit: Payer: Self-pay

## 2020-12-25 DIAGNOSIS — E1169 Type 2 diabetes mellitus with other specified complication: Secondary | ICD-10-CM | POA: Diagnosis present

## 2020-12-25 DIAGNOSIS — R609 Edema, unspecified: Secondary | ICD-10-CM

## 2020-12-25 DIAGNOSIS — Z9119 Patient's noncompliance with other medical treatment and regimen: Secondary | ICD-10-CM

## 2020-12-25 DIAGNOSIS — Z91199 Patient's noncompliance with other medical treatment and regimen due to unspecified reason: Secondary | ICD-10-CM

## 2020-12-25 DIAGNOSIS — E1121 Type 2 diabetes mellitus with diabetic nephropathy: Secondary | ICD-10-CM

## 2020-12-25 DIAGNOSIS — F17290 Nicotine dependence, other tobacco product, uncomplicated: Secondary | ICD-10-CM | POA: Diagnosis present

## 2020-12-25 DIAGNOSIS — I5082 Biventricular heart failure: Secondary | ICD-10-CM | POA: Diagnosis present

## 2020-12-25 DIAGNOSIS — N049 Nephrotic syndrome with unspecified morphologic changes: Secondary | ICD-10-CM | POA: Diagnosis present

## 2020-12-25 DIAGNOSIS — I509 Heart failure, unspecified: Secondary | ICD-10-CM

## 2020-12-25 DIAGNOSIS — Z9114 Patient's other noncompliance with medication regimen: Secondary | ICD-10-CM

## 2020-12-25 DIAGNOSIS — H409 Unspecified glaucoma: Secondary | ICD-10-CM | POA: Diagnosis present

## 2020-12-25 DIAGNOSIS — I110 Hypertensive heart disease with heart failure: Secondary | ICD-10-CM | POA: Diagnosis not present

## 2020-12-25 DIAGNOSIS — I2510 Atherosclerotic heart disease of native coronary artery without angina pectoris: Secondary | ICD-10-CM | POA: Diagnosis present

## 2020-12-25 DIAGNOSIS — I10 Essential (primary) hypertension: Secondary | ICD-10-CM | POA: Diagnosis present

## 2020-12-25 DIAGNOSIS — E1151 Type 2 diabetes mellitus with diabetic peripheral angiopathy without gangrene: Secondary | ICD-10-CM | POA: Diagnosis present

## 2020-12-25 DIAGNOSIS — R1909 Other intra-abdominal and pelvic swelling, mass and lump: Secondary | ICD-10-CM | POA: Diagnosis present

## 2020-12-25 DIAGNOSIS — I130 Hypertensive heart and chronic kidney disease with heart failure and stage 1 through stage 4 chronic kidney disease, or unspecified chronic kidney disease: Principal | ICD-10-CM | POA: Diagnosis present

## 2020-12-25 DIAGNOSIS — I214 Non-ST elevation (NSTEMI) myocardial infarction: Secondary | ICD-10-CM | POA: Diagnosis not present

## 2020-12-25 DIAGNOSIS — R0602 Shortness of breath: Secondary | ICD-10-CM | POA: Diagnosis not present

## 2020-12-25 DIAGNOSIS — I428 Other cardiomyopathies: Secondary | ICD-10-CM | POA: Diagnosis present

## 2020-12-25 DIAGNOSIS — Z89412 Acquired absence of left great toe: Secondary | ICD-10-CM

## 2020-12-25 DIAGNOSIS — Z833 Family history of diabetes mellitus: Secondary | ICD-10-CM

## 2020-12-25 DIAGNOSIS — J9811 Atelectasis: Secondary | ICD-10-CM | POA: Diagnosis not present

## 2020-12-25 DIAGNOSIS — Z638 Other specified problems related to primary support group: Secondary | ICD-10-CM

## 2020-12-25 DIAGNOSIS — I493 Ventricular premature depolarization: Secondary | ICD-10-CM | POA: Diagnosis not present

## 2020-12-25 DIAGNOSIS — N183 Chronic kidney disease, stage 3 unspecified: Secondary | ICD-10-CM | POA: Diagnosis present

## 2020-12-25 DIAGNOSIS — E1122 Type 2 diabetes mellitus with diabetic chronic kidney disease: Secondary | ICD-10-CM | POA: Diagnosis present

## 2020-12-25 DIAGNOSIS — I160 Hypertensive urgency: Secondary | ICD-10-CM | POA: Diagnosis present

## 2020-12-25 DIAGNOSIS — R809 Proteinuria, unspecified: Secondary | ICD-10-CM | POA: Diagnosis present

## 2020-12-25 DIAGNOSIS — I5043 Acute on chronic combined systolic (congestive) and diastolic (congestive) heart failure: Secondary | ICD-10-CM | POA: Diagnosis present

## 2020-12-25 DIAGNOSIS — M7989 Other specified soft tissue disorders: Secondary | ICD-10-CM | POA: Diagnosis present

## 2020-12-25 DIAGNOSIS — N5089 Other specified disorders of the male genital organs: Secondary | ICD-10-CM | POA: Diagnosis present

## 2020-12-25 DIAGNOSIS — E785 Hyperlipidemia, unspecified: Secondary | ICD-10-CM | POA: Diagnosis present

## 2020-12-25 DIAGNOSIS — Z89411 Acquired absence of right great toe: Secondary | ICD-10-CM

## 2020-12-25 DIAGNOSIS — I5021 Acute systolic (congestive) heart failure: Secondary | ICD-10-CM | POA: Diagnosis present

## 2020-12-25 DIAGNOSIS — Z91030 Bee allergy status: Secondary | ICD-10-CM

## 2020-12-25 DIAGNOSIS — N1831 Chronic kidney disease, stage 3a: Secondary | ICD-10-CM | POA: Diagnosis present

## 2020-12-25 DIAGNOSIS — R570 Cardiogenic shock: Secondary | ICD-10-CM | POA: Diagnosis not present

## 2020-12-25 DIAGNOSIS — Z7984 Long term (current) use of oral hypoglycemic drugs: Secondary | ICD-10-CM

## 2020-12-25 DIAGNOSIS — Z20822 Contact with and (suspected) exposure to covid-19: Secondary | ICD-10-CM | POA: Diagnosis present

## 2020-12-25 DIAGNOSIS — E119 Type 2 diabetes mellitus without complications: Secondary | ICD-10-CM

## 2020-12-25 DIAGNOSIS — N179 Acute kidney failure, unspecified: Secondary | ICD-10-CM | POA: Diagnosis not present

## 2020-12-25 DIAGNOSIS — Z79899 Other long term (current) drug therapy: Secondary | ICD-10-CM

## 2020-12-25 DIAGNOSIS — E1165 Type 2 diabetes mellitus with hyperglycemia: Secondary | ICD-10-CM | POA: Diagnosis present

## 2020-12-25 LAB — BASIC METABOLIC PANEL
Anion gap: 8 (ref 5–15)
BUN: 27 mg/dL — ABNORMAL HIGH (ref 8–23)
CO2: 25 mmol/L (ref 22–32)
Calcium: 8.8 mg/dL — ABNORMAL LOW (ref 8.9–10.3)
Chloride: 102 mmol/L (ref 98–111)
Creatinine, Ser: 1.71 mg/dL — ABNORMAL HIGH (ref 0.61–1.24)
GFR, Estimated: 45 mL/min — ABNORMAL LOW (ref >60)
Glucose, Bld: 238 mg/dL — ABNORMAL HIGH (ref 70–99)
Potassium: 4.8 mmol/L (ref 3.5–5.1)
Sodium: 135 mmol/L (ref 135–145)

## 2020-12-25 LAB — TROPONIN I (HIGH SENSITIVITY)
Troponin I (High Sensitivity): 25 ng/L — ABNORMAL HIGH (ref <18)
Troponin I (High Sensitivity): 28 ng/L — ABNORMAL HIGH (ref <18)

## 2020-12-25 LAB — CBC
HCT: 38.3 % — ABNORMAL LOW (ref 39.0–52.0)
Hemoglobin: 12 g/dL — ABNORMAL LOW (ref 13.0–17.0)
MCH: 26.7 pg (ref 26.0–34.0)
MCHC: 31.3 g/dL (ref 30.0–36.0)
MCV: 85.3 fL (ref 80.0–100.0)
Platelets: 302 10*3/uL (ref 150–400)
RBC: 4.49 MIL/uL (ref 4.22–5.81)
RDW: 16.1 % — ABNORMAL HIGH (ref 11.5–15.5)
WBC: 5.5 10*3/uL (ref 4.0–10.5)
nRBC: 0 % (ref 0.0–0.2)

## 2020-12-25 NOTE — ED Provider Notes (Signed)
[facility] EMERGENCY DEPARTMENT Provider Note   CSN: [id number] Arrival date & time: 12/25/20  1821     History Chief Complaint  Patient presents with  . Edema  . Groin Swelling    Scott Greer is a 65 y.o. male.  65 year old male with history of hypertension, dyslipidemia, diabetes presents to the emergency department for evaluation of lower extremity swelling.  States that he has had a degree of mild lower extremity edema at baseline, but symptoms have been worse over the past 2 to 3 weeks.  Swelling has extended up his legs into his scrotum as well as his lower abdomen.  Triage note references complaints of shortness of breath over the past 3 days.  He notices this mostly when he is active, like when he tries to bend over to put on his pants or shoes.  He alludes to the fact that this puts increased pressure on his abdomen where he feels like he is retaining additional fluid.  He has no increased shortness of breath when supine.  Denies chest pain, fevers, nausea, vomiting.  He continues to void without difficulty.  Reports noncompliance with his prescribed medications for at least the past 8 months.  He was seen by primary care 1.5 weeks ago and received a refill of his prescriptions, but never picked these up.  Has difficulty affording his prescriptions due to a fixed income.        Past Medical History:  Diagnosis Date  . Dehiscence of amputation stump (HCC)    left great toe  . Diabetes mellitus without complication ([place]) 4742   Type II  . Glaucoma 2015  . Hyperlipidemia   . Hypertension 2005  . Osteomyelitis ([place])   . Wears glasses     Patient Active Problem List   Diagnosis Date Noted  . Anemia, chronic disease 06/22/2020  . Amputee, great toe, right ([place]) 06/20/2020  . Normocytic anemia 06/20/2020  . Erectile dysfunction associated with type 2 diabetes mellitus ([place]) 06/20/2020  . Positive for macroalbuminuria 10/24/2019  . Amputation of left  great toe ([place]) 10/23/2019  . Tobacco dependence 10/23/2019  . Osteomyelitis of great toe of right foot ([place]) 06/04/2019  . Diabetic foot infection ([place])   . Noncompliance with medication regimen   . Glaucoma   . Hyperlipidemia   . Essential hypertension 11/06/2003  . Diabetes mellitus ([place]) 11/05/1997    Past Surgical History:  Procedure Laterality Date  . AMPUTATION Left 06/05/2019   Procedure: LEFT GREAT TOE AMPUTATION;  Surgeon: Newt Minion, MD;  Location: [facility];  Service: Orthopedics;  Laterality: Left;  . AMPUTATION Left 07/10/2019   Procedure: LEFT FOOT 1ST RAY AMPUTATION;  Surgeon: Newt Minion, MD;  Location: [facility];  Service: Orthopedics;  Laterality: Left;  . AMPUTATION Right 05/25/2020   Procedure: RIGHT GREAT TOE AMPUTATION;  Surgeon: Newt Minion, MD;  Location: [facility];  Service: Orthopedics;  Laterality: Right;  . NO PAST SURGERIES         Family History  Problem Relation Age of Onset  . Stroke Mother   . Diabetes Mother        Toward end of life    Social History   Tobacco Use  . Smoking status: Current Every Day Smoker    Types: Cigars  . Smokeless tobacco: Former Systems developer    Types: Chew  . Tobacco comment: 6 daily  Vaping Use  . Vaping Use: Never used  Substance Use Topics  .  Alcohol use: Yes    Comment: occasional  . Drug use: Yes    Types: Marijuana    Comment: ocassional - last time 05/08/2020    Home Medications Prior to Admission medications   Medication Sig Start Date End Date Taking? Authorizing Provider  atorvastatin (LIPITOR) 10 MG tablet Take 1 tablet (10 mg total) by mouth daily. 12/14/20  Yes [name], Cari S, PA-C  Blood Glucose Monitoring Suppl (TRUE METRIX METER) w/Device KIT Use as directed 10/23/19  Yes [name], MD  glucose blood (TRUE METRIX BLOOD GLUCOSE TEST) test strip Use as instructed 10/23/19  Yes [name], MD  lisinopril (ZESTRIL) 10 MG tablet Take 1 tablet (10 mg total) by mouth daily. 12/14/20  Yes [name],  Cari S, PA-C  metFORMIN (GLUCOPHAGE) 500 MG tablet Take 1 tablet (500 mg total) by mouth 2 (two) times daily with a meal. 12/14/20  Yes [name], Cari S, PA-C  TRUEplus Lancets 28G MISC Use as directed 10/23/19  Yes [name], MD  Vitamin D, Ergocalciferol, (DRISDOL) 1.25 MG (50000 UNIT) CAPS capsule Take 1 capsule (50,000 Units total) by mouth every 7 (seven) days. 12/16/20  Yes [name], Cari S, PA-C  tadalafil (CIALIS) 5 MG tablet 2 tabs 1/2 hr before sex as needed.  Limit use to 2 tabs/24 hrs Patient not taking: No sig reported 06/20/20   [name], MD    Allergies    Bee venom  Review of Systems   Review of Systems  Ten systems reviewed and are negative for acute change, except as noted in the HPI.    Physical Exam Updated Vital Signs BP (!) 143/111 (BP Location: Right Arm)   Pulse 94   Temp 98.4 F (36.9 C) (Oral)   Resp (!) 21   SpO2 96%   Physical Exam Vitals and nursing note reviewed.  Constitutional:      General: He is not in acute distress.    Appearance: He is well-developed and well-nourished. He is not diaphoretic.     Comments: Nontoxic appearing, pleasant AA male.  HENT:     Head: Normocephalic and atraumatic.  Eyes:     General: No scleral icterus.    Extraocular Movements: EOM normal.     Conjunctiva/sclera: Conjunctivae normal.  Cardiovascular:     Rate and Rhythm: Regular rhythm.     Pulses: Normal pulses.     Comments: Borderline tachycardia.  Pulmonary:     Effort: Pulmonary effort is normal. No respiratory distress.     Breath sounds: No stridor. No wheezing or rales.     Comments: Lungs grossly CTAB. Abdominal:     Comments: Soft, nontender abdomen.  Musculoskeletal:        General: Normal range of motion.     Cervical back: Normal range of motion.     Comments: 2+ pitting edema BLE with anasarca up to the lower abdominal wall, including scrotal sac.  Skin:    General: Skin is warm and dry.     Coloration: Skin is not pale.      Findings: No erythema or rash.  Neurological:     Mental Status: He is alert and oriented to person, place, and time.     Coordination: Coordination normal.  Psychiatric:        Mood and Affect: Mood and affect normal.        Behavior: Behavior normal.     ED Results / Procedures / Treatments   Labs (all labs ordered are listed,  but only abnormal results are displayed) Labs Reviewed  BASIC METABOLIC PANEL - Abnormal; Notable for the following components:      Result Value   Glucose, Bld 238 (*)    BUN 27 (*)    Creatinine, Ser 1.71 (*)    Calcium 8.8 (*)    GFR, Estimated 45 (*)    All other components within normal limits  CBC - Abnormal; Notable for the following components:   Hemoglobin 12.0 (*)    HCT 38.3 (*)    RDW 16.1 (*)    All other components within normal limits  HEPATIC FUNCTION PANEL - Abnormal; Notable for the following components:   Total Protein 6.2 (*)    Albumin 3.1 (*)    Alkaline Phosphatase 179 (*)    Bilirubin, Direct 0.4 (*)    All other components within normal limits  BRAIN NATRIURETIC PEPTIDE - Abnormal; Notable for the following components:   B Natriuretic Peptide 2,579.0 (*)    All other components within normal limits  TROPONIN I (HIGH SENSITIVITY) - Abnormal; Notable for the following components:   Troponin I (High Sensitivity) 25 (*)    All other components within normal limits  TROPONIN I (HIGH SENSITIVITY) - Abnormal; Notable for the following components:   Troponin I (High Sensitivity) 28 (*)    All other components within normal limits  SARS CORONAVIRUS 2 (TAT 6-24 HRS)  URINALYSIS, ROUTINE W REFLEX MICROSCOPIC    EKG EKG Interpretation  Date/Time:  Sunday December 25 2020 18:53:05 EST Ventricular Rate:  104 PR Interval:  120 QRS Duration: 96 QT Interval:  366 QTC Calculation: 481 R Axis:   -14 Text Interpretation: Sinus tachycardia with frequent Premature ventricular complexes Nonspecific T wave abnormality Abnormal ECG No  STEMI Confirmed by Octaviano Glow [id number]) on 12/26/2020 12:44:37 AM   Radiology DG Chest 2 View  Result Date: 12/25/2020 CLINICAL DATA:  Shortness of breath. EXAM: CHEST - 2 VIEW COMPARISON:  June 06, 2019 FINDINGS: Decreased lung volumes are seen which is likely, in part, secondary to the degree of patient inspiration. Mild atelectasis is seen within the medial aspect of the right lung base. There is no evidence of a pleural effusion or pneumothorax. Stable elevation of the right hemidiaphragm is seen. The cardiac silhouette is mildly enlarged. The visualized skeletal structures are unremarkable. IMPRESSION: Mild right basilar atelectasis. Electronically Signed   By: Virgina Norfolk M.D.   On: 12/25/2020 19:39    Procedures Procedures   Medications Ordered in ED Medications  furosemide (LASIX) injection 40 mg (40 mg Intravenous Given 12/26/20 0218)    ED Course  I have reviewed the triage vital signs and the nursing notes.  Pertinent labs & imaging results that were available during my care of the patient were reviewed by me and considered in my medical decision making (see chart for details).  Clinical Course as of 12/26/20 0258  Nancy Fetter Dec 25, 2020  2346 Patient with findings of anasarca ascending to his lower abdomen.  Given his history, there is concern for nephrotic syndrome, possibly precipitated by his untreated diabetes.  His creatinine has been worsening over the past several months, likely due to medication noncompliance, but is fairly stable compared to 10 days ago when he was assessed at primary care.  CMP, UA added to assess albumin and proteinuria.   His chest x-ray is not suggestive of vascular congestion or other acute cardiopulmonary abnormality, though he does have a mild troponin elevation of 25.  Delta pending.  This is favored to reflect demand rather than acute ischemia.  BNP added for evaluation of heart failure. [KH]    Clinical Course User Index [KH] Antonietta Breach, PA-C   MDM Rules/Calculators/A&P                          65 year old male to be admitted for management of anasarca up to his lower abdomen.  This is suspected to be secondary to a degree of nephrotic syndrome from untreated diabetes.  Noncompliant with his prescribed medications for at least the last 8 months.  Started on IV Lasix in the emergency department.  Does also have findings suggestive of new onset CHF.  BNP 2579.  He has mild troponin elevation which is favored to reflect demand.  Denies chest pain, fevers, syncope.  Case discussed with Dr. Alcario Drought who will assess in the ED for admission.   Final Clinical Impression(s) / ED Diagnoses Final diagnoses:  Peripheral edema  NSTEMI (non-ST elevated myocardial infarction) [facility])  New onset of congestive heart failure [facility])    Rx / DC Orders ED Discharge Orders    None       Antonietta Breach, PA-C 12/26/20 0239    Wyvonnia Dusky, MD 12/26/20 [phone]

## 2020-12-25 NOTE — ED Triage Notes (Signed)
Pt reports bilateral lower extremity edema, abd swelling, testicle swelling and SOB x 3 days.  States he doesn't have his medications because he is on a fixed income.

## 2020-12-26 ENCOUNTER — Observation Stay (HOSPITAL_BASED_OUTPATIENT_CLINIC_OR_DEPARTMENT_OTHER): Payer: Medicaid Other

## 2020-12-26 DIAGNOSIS — E1121 Type 2 diabetes mellitus with diabetic nephropathy: Secondary | ICD-10-CM | POA: Diagnosis not present

## 2020-12-26 DIAGNOSIS — N183 Chronic kidney disease, stage 3 unspecified: Secondary | ICD-10-CM | POA: Diagnosis present

## 2020-12-26 DIAGNOSIS — I110 Hypertensive heart disease with heart failure: Secondary | ICD-10-CM | POA: Diagnosis not present

## 2020-12-26 DIAGNOSIS — J9811 Atelectasis: Secondary | ICD-10-CM | POA: Diagnosis not present

## 2020-12-26 DIAGNOSIS — Z9114 Patient's other noncompliance with medication regimen: Secondary | ICD-10-CM

## 2020-12-26 DIAGNOSIS — I493 Ventricular premature depolarization: Secondary | ICD-10-CM | POA: Diagnosis not present

## 2020-12-26 DIAGNOSIS — I5043 Acute on chronic combined systolic (congestive) and diastolic (congestive) heart failure: Secondary | ICD-10-CM | POA: Diagnosis not present

## 2020-12-26 DIAGNOSIS — N1831 Chronic kidney disease, stage 3a: Secondary | ICD-10-CM

## 2020-12-26 DIAGNOSIS — Z515 Encounter for palliative care: Secondary | ICD-10-CM | POA: Diagnosis not present

## 2020-12-26 DIAGNOSIS — I214 Non-ST elevation (NSTEMI) myocardial infarction: Secondary | ICD-10-CM

## 2020-12-26 DIAGNOSIS — Z419 Encounter for procedure for purposes other than remedying health state, unspecified: Secondary | ICD-10-CM | POA: Diagnosis not present

## 2020-12-26 DIAGNOSIS — H409 Unspecified glaucoma: Secondary | ICD-10-CM | POA: Diagnosis not present

## 2020-12-26 DIAGNOSIS — R609 Edema, unspecified: Secondary | ICD-10-CM | POA: Diagnosis not present

## 2020-12-26 DIAGNOSIS — I130 Hypertensive heart and chronic kidney disease with heart failure and stage 1 through stage 4 chronic kidney disease, or unspecified chronic kidney disease: Secondary | ICD-10-CM | POA: Diagnosis not present

## 2020-12-26 DIAGNOSIS — Z89412 Acquired absence of left great toe: Secondary | ICD-10-CM | POA: Diagnosis not present

## 2020-12-26 DIAGNOSIS — N049 Nephrotic syndrome with unspecified morphologic changes: Secondary | ICD-10-CM | POA: Diagnosis not present

## 2020-12-26 DIAGNOSIS — R0602 Shortness of breath: Secondary | ICD-10-CM | POA: Diagnosis not present

## 2020-12-26 DIAGNOSIS — E1169 Type 2 diabetes mellitus with other specified complication: Secondary | ICD-10-CM | POA: Diagnosis not present

## 2020-12-26 DIAGNOSIS — I10 Essential (primary) hypertension: Secondary | ICD-10-CM | POA: Diagnosis not present

## 2020-12-26 DIAGNOSIS — F17290 Nicotine dependence, other tobacco product, uncomplicated: Secondary | ICD-10-CM | POA: Diagnosis present

## 2020-12-26 DIAGNOSIS — E1122 Type 2 diabetes mellitus with diabetic chronic kidney disease: Secondary | ICD-10-CM | POA: Diagnosis not present

## 2020-12-26 DIAGNOSIS — E785 Hyperlipidemia, unspecified: Secondary | ICD-10-CM | POA: Diagnosis not present

## 2020-12-26 DIAGNOSIS — E1151 Type 2 diabetes mellitus with diabetic peripheral angiopathy without gangrene: Secondary | ICD-10-CM | POA: Diagnosis not present

## 2020-12-26 DIAGNOSIS — R1909 Other intra-abdominal and pelvic swelling, mass and lump: Secondary | ICD-10-CM | POA: Diagnosis not present

## 2020-12-26 DIAGNOSIS — I509 Heart failure, unspecified: Secondary | ICD-10-CM

## 2020-12-26 DIAGNOSIS — I2510 Atherosclerotic heart disease of native coronary artery without angina pectoris: Secondary | ICD-10-CM | POA: Diagnosis present

## 2020-12-26 DIAGNOSIS — Z7189 Other specified counseling: Secondary | ICD-10-CM | POA: Diagnosis not present

## 2020-12-26 DIAGNOSIS — I5031 Acute diastolic (congestive) heart failure: Secondary | ICD-10-CM | POA: Diagnosis not present

## 2020-12-26 DIAGNOSIS — I5021 Acute systolic (congestive) heart failure: Secondary | ICD-10-CM | POA: Diagnosis not present

## 2020-12-26 DIAGNOSIS — R570 Cardiogenic shock: Secondary | ICD-10-CM | POA: Diagnosis not present

## 2020-12-26 DIAGNOSIS — R809 Proteinuria, unspecified: Secondary | ICD-10-CM

## 2020-12-26 DIAGNOSIS — Z20822 Contact with and (suspected) exposure to covid-19: Secondary | ICD-10-CM | POA: Diagnosis not present

## 2020-12-26 DIAGNOSIS — N179 Acute kidney failure, unspecified: Secondary | ICD-10-CM | POA: Diagnosis not present

## 2020-12-26 DIAGNOSIS — I5082 Biventricular heart failure: Secondary | ICD-10-CM | POA: Diagnosis present

## 2020-12-26 DIAGNOSIS — I160 Hypertensive urgency: Secondary | ICD-10-CM | POA: Diagnosis present

## 2020-12-26 DIAGNOSIS — E1165 Type 2 diabetes mellitus with hyperglycemia: Secondary | ICD-10-CM | POA: Diagnosis present

## 2020-12-26 DIAGNOSIS — N5089 Other specified disorders of the male genital organs: Secondary | ICD-10-CM | POA: Diagnosis present

## 2020-12-26 DIAGNOSIS — M7989 Other specified soft tissue disorders: Secondary | ICD-10-CM | POA: Diagnosis not present

## 2020-12-26 DIAGNOSIS — I428 Other cardiomyopathies: Secondary | ICD-10-CM | POA: Diagnosis present

## 2020-12-26 LAB — URINALYSIS, ROUTINE W REFLEX MICROSCOPIC
Bacteria, UA: NONE SEEN
Bilirubin Urine: NEGATIVE
Glucose, UA: NEGATIVE mg/dL
Hgb urine dipstick: NEGATIVE
Ketones, ur: NEGATIVE mg/dL
Leukocytes,Ua: NEGATIVE
Nitrite: NEGATIVE
Protein, ur: 30 mg/dL — AB
Specific Gravity, Urine: 1.005 (ref 1.005–1.030)
pH: 5 (ref 5.0–8.0)

## 2020-12-26 LAB — BASIC METABOLIC PANEL
Anion gap: 11 (ref 5–15)
BUN: 26 mg/dL — ABNORMAL HIGH (ref 8–23)
CO2: 24 mmol/L (ref 22–32)
Calcium: 9.2 mg/dL (ref 8.9–10.3)
Chloride: 104 mmol/L (ref 98–111)
Creatinine, Ser: 1.62 mg/dL — ABNORMAL HIGH (ref 0.61–1.24)
GFR, Estimated: 48 mL/min — ABNORMAL LOW (ref >60)
Glucose, Bld: 147 mg/dL — ABNORMAL HIGH (ref 70–99)
Potassium: 4.5 mmol/L (ref 3.5–5.1)
Sodium: 139 mmol/L (ref 135–145)

## 2020-12-26 LAB — CBG MONITORING, ED
Glucose-Capillary: 126 mg/dL — ABNORMAL HIGH (ref 70–99)
Glucose-Capillary: 173 mg/dL — ABNORMAL HIGH (ref 70–99)
Glucose-Capillary: 114 mg/dL — ABNORMAL HIGH (ref 70–99)
Glucose-Capillary: 163 mg/dL — ABNORMAL HIGH (ref 70–99)

## 2020-12-26 LAB — ECHOCARDIOGRAM COMPLETE
Area-P 1/2: 3.08 cm2
S' Lateral: 4.7 cm
Single Plane A4C EF: 39.5 %

## 2020-12-26 LAB — HEPATIC FUNCTION PANEL
ALT: 30 U/L (ref 0–44)
AST: 25 U/L (ref 15–41)
Albumin: 3.1 g/dL — ABNORMAL LOW (ref 3.5–5.0)
Alkaline Phosphatase: 179 U/L — ABNORMAL HIGH (ref 38–126)
Bilirubin, Direct: 0.4 mg/dL — ABNORMAL HIGH (ref 0.0–0.2)
Indirect Bilirubin: 0.7 mg/dL (ref 0.3–0.9)
Total Bilirubin: 1.1 mg/dL (ref 0.3–1.2)
Total Protein: 6.2 g/dL — ABNORMAL LOW (ref 6.5–8.1)

## 2020-12-26 LAB — HIV ANTIBODY (ROUTINE TESTING W REFLEX): HIV Screen 4th Generation wRfx: NONREACTIVE

## 2020-12-26 LAB — SARS CORONAVIRUS 2 (TAT 6-24 HRS): SARS Coronavirus 2: NEGATIVE

## 2020-12-26 LAB — BRAIN NATRIURETIC PEPTIDE: B Natriuretic Peptide: 2579 pg/mL — ABNORMAL HIGH (ref 0.0–100.0)

## 2020-12-26 MED ORDER — ONDANSETRON HCL 4 MG/2ML IJ SOLN: 4.0000 mg | Freq: Four times a day (QID) | INTRAMUSCULAR | Status: DC | PRN

## 2020-12-26 MED ORDER — FUROSEMIDE 10 MG/ML IJ SOLN
40.0000 mg | INTRAMUSCULAR | Status: AC
  Administered 2020-12-26: 40 mg via INTRAVENOUS
  Filled 2020-12-26: qty 4

## 2020-12-26 MED ORDER — INSULIN ASPART 100 UNIT/ML ~~LOC~~ SOLN
0.0000 [IU] | Freq: Every day | SUBCUTANEOUS | Status: DC
  Administered 2020-12-27 – 2021-01-01 (×3): 2 [IU] via SUBCUTANEOUS

## 2020-12-26 MED ORDER — FUROSEMIDE 10 MG/ML IJ SOLN
40.0000 mg | Freq: Once | INTRAMUSCULAR | Status: AC
  Administered 2020-12-26: 40 mg via INTRAVENOUS
  Filled 2020-12-26: qty 4

## 2020-12-26 MED ORDER — INSULIN ASPART 100 UNIT/ML ~~LOC~~ SOLN
0.0000 [IU] | Freq: Three times a day (TID) | SUBCUTANEOUS | Status: DC
  Administered 2020-12-26: 3 [IU] via SUBCUTANEOUS
  Administered 2020-12-26: 2 [IU] via SUBCUTANEOUS
  Administered 2020-12-27 (×3): 3 [IU] via SUBCUTANEOUS
  Administered 2020-12-28: 2 [IU] via SUBCUTANEOUS
  Administered 2020-12-28: 5 [IU] via SUBCUTANEOUS
  Administered 2020-12-29 – 2020-12-30 (×4): 3 [IU] via SUBCUTANEOUS
  Administered 2020-12-30: 2 [IU] via SUBCUTANEOUS
  Administered 2020-12-30 – 2020-12-31 (×2): 3 [IU] via SUBCUTANEOUS
  Administered 2020-12-31: 2 [IU] via SUBCUTANEOUS
  Administered 2020-12-31 – 2021-01-01 (×2): 3 [IU] via SUBCUTANEOUS
  Administered 2021-01-01: 5 [IU] via SUBCUTANEOUS
  Administered 2021-01-01: 2 [IU] via SUBCUTANEOUS
  Administered 2021-01-02: 3 [IU] via SUBCUTANEOUS
  Administered 2021-01-02: 2 [IU] via SUBCUTANEOUS
  Administered 2021-01-03: 5 [IU] via SUBCUTANEOUS
  Administered 2021-01-03: 2 [IU] via SUBCUTANEOUS
  Administered 2021-01-03: 5 [IU] via SUBCUTANEOUS
  Administered 2021-01-04 – 2021-01-05 (×3): 3 [IU] via SUBCUTANEOUS

## 2020-12-26 MED ORDER — ACETAMINOPHEN 325 MG PO TABS
650.0000 mg | ORAL_TABLET | ORAL | Status: DC | PRN
  Administered 2021-01-01: 650 mg via ORAL
  Filled 2020-12-26: qty 2

## 2020-12-26 MED ORDER — SODIUM CHLORIDE 0.9 % IV SOLN: 250.0000 mL | INTRAVENOUS | Status: DC | PRN

## 2020-12-26 MED ORDER — LISINOPRIL 10 MG PO TABS
10.0000 mg | ORAL_TABLET | Freq: Every day | ORAL | Status: DC
Start: 2020-12-26 — End: 2020-12-26
  Administered 2020-12-26: 10 mg via ORAL
  Filled 2020-12-26: qty 1

## 2020-12-26 MED ORDER — ENOXAPARIN SODIUM 40 MG/0.4ML ~~LOC~~ SOLN
40.0000 mg | SUBCUTANEOUS | Status: DC
  Administered 2020-12-26 – 2020-12-31 (×6): 40 mg via SUBCUTANEOUS
  Filled 2020-12-26 (×6): qty 0.4

## 2020-12-26 MED ORDER — SODIUM CHLORIDE 0.9% FLUSH: 3.0000 mL | INTRAVENOUS | Status: DC | PRN

## 2020-12-26 MED ORDER — IRBESARTAN 150 MG PO TABS
150.0000 mg | ORAL_TABLET | Freq: Every day | ORAL | Status: AC
  Administered 2020-12-26 – 2020-12-27 (×2): 150 mg via ORAL
  Filled 2020-12-26 (×3): qty 1

## 2020-12-26 MED ORDER — ATORVASTATIN CALCIUM 10 MG PO TABS
10.0000 mg | ORAL_TABLET | Freq: Every day | ORAL | Status: DC
  Administered 2020-12-26 – 2021-01-05 (×11): 10 mg via ORAL
  Filled 2020-12-26 (×11): qty 1

## 2020-12-26 MED ORDER — FUROSEMIDE 10 MG/ML IJ SOLN
40.0000 mg | Freq: Two times a day (BID) | INTRAMUSCULAR | Status: DC
  Administered 2020-12-26 – 2020-12-31 (×10): 40 mg via INTRAVENOUS
  Filled 2020-12-26 (×10): qty 4

## 2020-12-26 MED ORDER — SODIUM CHLORIDE 0.9% FLUSH
3.0000 mL | Freq: Two times a day (BID) | INTRAVENOUS | Status: DC
  Administered 2020-12-26 – 2021-01-03 (×13): 3 mL via INTRAVENOUS

## 2020-12-26 NOTE — ED Notes (Signed)
Pt given a urinal for a 24-hour urinal collected, to be collected 2/21-2/22. Pt educated. Urinal marked.

## 2020-12-26 NOTE — Progress Notes (Signed)
  Echocardiogram 2D Echocardiogram has been performed.  12/26/2020, 10:31 AM

## 2020-12-26 NOTE — Progress Notes (Addendum)
Progress Note    Scott Greer  ZOX:[id number] DOB: 01-07-1956  DOA: 12/25/2020 PCP: [name], MD    Brief Narrative:   Chief complaint: Edema  Medical records reviewed and are as summarized below:  Scott Greer is an 65 y.o. male with a PMH of DM2 (non-adherent to medical therapy), HTN, left great toe amputation due to osteomyelitis who presented to the ED for evaluation of BLE swelling/edema to scrotum associated with DOE. Has not taken any medications x 8 months. BNP 2579 and CXR showed mild right basilar atelectasis.  Assessment/Plan:   Principal Problem:   Acute systolic congestive heart failure (HCC), POA Aggressively diuresed with lasix (total of 80 mg given in the ED) and started on ACE-I. I/O balance -1.7 L this a.m. Monitor on tele.  R/O nephrotic syndrome given proteinuria. 2 D echo showed low EF (formal read pending). Will ask CHF team to consult. Continue Lasix 40 mg IV BID. Remains volume overloaded with 3+ pitting edema, JVD, conversational dyspnea.  Active Problems:   Diabetes mellitus, uncontrolled, with renal complications (HCC), POA Has not been taking medications to control. Holding Metformin. Continue moderate scale SSI. Hgb A1c 8.4% on 12/14/20.     Essential hypertension, chronic, POA Non-adherent with medications. Lisinopril started. BP still elevated.  Will need titration of meds.    Noncompliance with medication regimen  Counseled.   Positive for macroalbuminuria 24 hour urine collection in progress.    CKD (chronic kidney disease) stage 3, GFR 30-59 ml/min (HCC) Monitor closely with diuresis and initiation of Lisinopril.  Nutritional status        There is no height or weight on file to calculate BMI.   Family Communication/Anticipated D/C date and plan/Code Status   DVT prophylaxis: enoxaparin (LOVENOX) injection 40 mg Start: 12/26/20 1000   Current Level of Care:: Level of care: Telemetry Cardiac Code Status: Full Code.   Family Communication: No family at bedside. Disposition Plan:  Observation  The patient will require care spanning > 2 midnights and should be moved to inpatient because: Volume overloaded, needs further IV diuresis in the setting of newly diagnosed acute systolic CHF  Dispo: The patient is from: Home              Anticipated d/c is to: Home              Anticipated d/c date is: 2 days              Patient currently is not medically stable to d/c.   Difficult to place patient No         Medical Consultants:    Cardiology   Anti-Infectives:    None  Subjective:   Still short of breath, unable to speak in complete sentences.  Denies chest pain.  No nausea or vomiting.   Objective:    Vitals:   12/26/20 0500 12/26/20 0600 12/26/20 0700 12/26/20 1100  BP: (!) 141/123 (!) 159/110 131/85 (!) 131/99  Pulse:   95 95  Resp: (!) 26 18 13 18   Temp:      TempSrc:      SpO2:   98% 98%    Intake/Output Summary (Last 24 hours) at 12/26/2020 1349 Last data filed at 12/26/2020 0615 Gross per 24 hour  Intake -  Output 1725 ml  Net -1725 ml   There were no vitals filed for this visit.  Exam: General: No acute distress. Obese. Cardiovascular: Heart sounds show a regular  rate, and rhythm. No gallops or rubs. No murmurs. Mild JVD. Lungs: Tachypneic, unable to speak in complete sentences. Decreased breath sounds. Abdomen: Soft, nontender, nondistended with normal active bowel sounds. No masses. No hepatosplenomegaly. Neurological: Alert and oriented 3. Moves all extremities 4 with equal strength. Cranial nerves II through XII grossly intact. Skin: Warm and dry. No rashes or lesions. Extremities: No clubbing or cyanosis. 3+ pitting edema. Pedal pulses 2+. Psychiatric: Mood and affect are flat. Insight and judgment are impaired.    Data Reviewed:   I have personally reviewed following labs and imaging studies:  Labs: Labs show the following:   Basic Metabolic  Panel: Recent Labs  Lab 12/25/20 1906 12/26/20 0730  NA 135 139  K 4.8 4.5  CL 102 104  CO2 25 24  GLUCOSE 238* 147*  BUN 27* 26*  CREATININE 1.71* 1.62*  CALCIUM 8.8* 9.2   GFR Estimated Creatinine Clearance: 64.4 mL/min (A) (by C-G formula based on SCr of 1.62 mg/dL (H)). Liver Function Tests: Recent Labs  Lab 12/25/20 0018  AST 25  ALT 30  ALKPHOS 179*  BILITOT 1.1  PROT 6.2*  ALBUMIN 3.1*   CBC: Recent Labs  Lab 12/25/20 1906  WBC 5.5  HGB 12.0*  HCT 38.3*  MCV 85.3  PLT 302   CBG: Recent Labs  Lab 12/26/20 0751 12/26/20 1306  GLUCAP 126* 173*    Microbiology Recent Results (from the past 240 hour(s))  SARS CORONAVIRUS 2 (TAT 6-24 HRS) Nasopharyngeal Nasopharyngeal Swab     Status: None   Collection Time: 12/26/20  2:09 AM   Specimen: Nasopharyngeal Swab  Result Value Ref Range Status   SARS Coronavirus 2 NEGATIVE NEGATIVE Final    Comment: (NOTE) SARS-CoV-2 target nucleic acids are NOT DETECTED.  The SARS-CoV-2 RNA is generally detectable in upper and lower respiratory specimens during the acute phase of infection. Negative results do not preclude SARS-CoV-2 infection, do not rule out co-infections with other pathogens, and should not be used as the sole basis for treatment or other patient management decisions. Negative results must be combined with clinical observations, patient history, and epidemiological information. The expected result is Negative.  Fact Sheet for Patients: HairSlick.no  Fact Sheet for Healthcare Providers: quierodirigir.com  This test is not yet approved or cleared by the [place] FDA and  has been authorized for detection and/or diagnosis of SARS-CoV-2 by FDA under an Emergency Use Authorization (EUA). This EUA will remain  in effect (meaning this test can be used) for the duration of the COVID-19 declaration under Se ction 564(b)(1) of the Act, 21  U.S.C. section 360bbb-3(b)(1), unless the authorization is terminated or revoked sooner.  Performed at [facility], [address] [place], [place] [address]     Procedures and diagnostic studies:  DG Chest 2 View  Result Date: 12/25/2020 CLINICAL DATA:  Shortness of breath. EXAM: CHEST - 2 VIEW COMPARISON:  June 06, 2019 FINDINGS: Decreased lung volumes are seen which is likely, in part, secondary to the degree of patient inspiration. Mild atelectasis is seen within the medial aspect of the right lung base. There is no evidence of a pleural effusion or pneumothorax. Stable elevation of the right hemidiaphragm is seen. The cardiac silhouette is mildly enlarged. The visualized skeletal structures are unremarkable. IMPRESSION: Mild right basilar atelectasis. Electronically Signed   By: Aram Candela M.D.   On: 12/25/2020 19:39    Medications:   . atorvastatin  10 mg Oral Daily  . enoxaparin (  LOVENOX) injection  40 mg Subcutaneous Q24H  . insulin aspart  0-15 Units Subcutaneous TID WC  . insulin aspart  0-5 Units Subcutaneous QHS  . lisinopril  10 mg Oral Daily  . sodium chloride flush  3 mL Intravenous Q12H   Continuous Infusions: . sodium chloride       LOS: 0 days   Hillery Aldo, MD  [facility]   [facility] How to contact the [facility] Attending or Consulting provider 7A - 7P or covering provider during after hours 7P -7A, for this patient?  1. Check the care team in [facility] and look for a) attending/consulting TR[facility] provider listed and b) the [facility] team listed 2. Log into [email or web address] and use [place]'s universal password to access. If you do not have the password, please contact the hospital operator. 3. Locate the [facility] provider you are looking for under [facility] and page to a number that you can be directly reached. 4. If you still have difficulty reaching the provider, please page the [facility] (Director on Call) for the Hospitalists listed on amion  for assistance.  12/26/2020, 1:49 PM

## 2020-12-26 NOTE — ED Notes (Signed)
Dr. Julian Reil notified patient with multiple cardiac alarms for PVCs. No new orders at this time.

## 2020-12-26 NOTE — H&P (Signed)
History and Physical    Scott Greer BJY:[id number] DOB: 07/20/1956 DOA: 12/25/2020  PCP: [name], MD  Patient coming from: Home  I have personally briefly reviewed patient's old medical records in [place]  Chief Complaint: Edema  HPI: Scott Greer is a 65 y.o. male with medical history significant of DM2, HTN, L great toe amputation due to osteomyelitis.  Pt not able to afford / complaint with DM or HTN meds for many months.  Recently seen in clinic earlier this month, noted to have Creat of 1.5, also noted to have macro albuminuria.  Pt presents to the ED with c/o BLE and scrotum swelling.  These symptoms onset and worsened over the past 2-3 weeks.  Now severe.  Also has DOE over the past 3 days.  Symptoms constant.  He went to PCP 1.5 weeks ago, got refills on meds but never ended up picking these up, so he has been off of his meds for the past 8 months or so.  No CP.  No abd pain.  ED Course: Creat 1.7 up from 1.5 earlier this month.  CXR neg  BNP 2579.  Given 24m IV lasix.   Review of Systems: As per HPI, otherwise all review of systems negative.  Past Medical History:  Diagnosis Date  . Dehiscence of amputation stump (HCC)    left great toe  . Diabetes mellitus without complication ([place] [address]  Type II  . Glaucoma 2015  . Hyperlipidemia   . Hypertension 2005  . [place]omyelitis ([place]   . Wears glasses     Past Surgical History:  Procedure Laterality Date  . AMPUTATION Left 06/05/2019   Procedure: LEFT GREAT TOE AMPUTATION;  Surgeon: DNewt Minion MD;  [place] [facility]  Service: Orthopedics;  Laterality: Left;  . AMPUTATION Left 07/10/2019   Procedure: LEFT FOOT 1ST RAY AMPUTATION;  Surgeon: DNewt Minion MD;  [facility] [facility]  Service: Orthopedics;  Laterality: Left;  . AMPUTATION Right 05/25/2020   Procedure: RIGHT GREAT TOE AMPUTATION;  Surgeon: DNewt Minion MD;  [place] [facility]  Service: Orthopedics;  Laterality: Right;  .  NO PAST SURGERIES       reports that he has been smoking cigars. He has quit using smokeless tobacco.  His smokeless tobacco use included chew. He reports current alcohol use. He reports current drug use. Drug: Marijuana.  Allergies  Allergen Reactions  . Bee Venom Hives, Itching and Swelling    Family History  Problem Relation Age of Onset  . Stroke Mother   . Diabetes Mother        Toward end of life     Prior to Admission medications   Medication Sig Start Date End Date Taking? Authorizing Provider  atorvastatin (LIPITOR) 10 MG tablet Take 1 tablet (10 mg total) by mouth daily. 12/14/20  Yes [name], Cari S, PA-C  Blood Glucose Monitoring Suppl (TRUE METRIX METER) w/Device KIT Use as directed 10/23/19  Yes JLadell Pier MD  glucose blood (TRUE METRIX BLOOD GLUCOSE TEST) test strip Use as instructed 10/23/19  Yes JLadell Pier MD  lisinopril (ZESTRIL) 10 MG tablet Take 1 tablet (10 mg total) by mouth daily. 12/14/20  Yes [name], Cari S, PA-C  metFORMIN (GLUCOPHAGE) 500 MG tablet Take 1 tablet (500 mg total) by mouth 2 (two) times daily with a meal. 12/14/20  Yes [name], Cari S, PA-C  TRUEplus Lancets 28G MISC Use as directed 10/23/19  Yes JKarle Plumber  B, MD  Vitamin D, Ergocalciferol, (DRISDOL) 1.25 MG (50000 UNIT) CAPS capsule Take 1 capsule (50,000 Units total) by mouth every 7 (seven) days. 12/16/20  Yes [name], [name], PA-C    Physical Exam: Vitals:   12/25/20 1834 12/25/20 2030 12/25/20 2300 12/26/20 0229  BP:  (!) 134/99 117/90 (!) 143/111  Pulse:  99 100 94  Resp:  18 (!) 23 (!) 21  Temp: 97.7 F (36.5 C) 98.4 F (36.9 C)    TempSrc: Oral Oral    SpO2:  100% 98% 96%    Constitutional: NAD, calm, comfortable Eyes: PERRL, lids and conjunctivae normal ENMT: Mucous membranes are moist. Posterior pharynx clear of any exudate or lesions.Normal dentition.  Neck: normal, supple, no masses, no thyromegaly Respiratory: clear to auscultation bilaterally, no  wheezing, no crackles. Normal respiratory effort. No accessory muscle use.  Cardiovascular: Regular rate and rhythm, no murmurs / rubs / gallops. Anasarca with 4+ pitting edema. 2+ pedal pulses. No carotid bruits.  Abdomen: no tenderness, no masses palpated. No hepatosplenomegaly. Bowel sounds positive.  Musculoskeletal: no clubbing / cyanosis. No joint deformity upper and lower extremities. Good ROM, no contractures. Normal muscle tone.  Skin: no rashes, lesions, ulcers. No induration Neurologic: CN 2-12 grossly intact. Sensation intact, DTR normal. Strength 5/5 in all 4.  Psychiatric: Normal judgment and insight. Alert and oriented x 3. Normal mood.    Labs on Admission: I have personally reviewed following labs and imaging studies  CBC: Recent Labs  Lab 12/25/20 1906  WBC 5.5  HGB 12.0*  HCT 38.3*  MCV 85.3  PLT 856   Basic Metabolic Panel: Recent Labs  Lab 12/25/20 1906  NA 135  K 4.8  CL 102  CO2 25  GLUCOSE 238*  BUN 27*  CREATININE 1.71*  CALCIUM 8.8*   GFR: Estimated Creatinine Clearance: 61 mL/min (A) (by C-G formula based on SCr of 1.71 mg/dL (H)). Liver Function Tests: Recent Labs  Lab 12/25/20 0018  AST 25  ALT 30  ALKPHOS 179*  BILITOT 1.1  PROT 6.2*  ALBUMIN 3.1*   No results for input(s): LIPASE, AMYLASE in the last 168 hours. No results for input(s): AMMONIA in the last 168 hours. Coagulation Profile: No results for input(s): INR, PROTIME in the last 168 hours. Cardiac Enzymes: No results for input(s): CKTOTAL, CKMB, CKMBINDEX, TROPONINI in the last 168 hours. BNP (last 3 results) No results for input(s): PROBNP in the last 8760 hours. HbA1C: No results for input(s): HGBA1C in the last 72 hours. CBG: No results for input(s): GLUCAP in the last 168 hours. Lipid Profile: No results for input(s): CHOL, HDL, LDLCALC, TRIG, CHOLHDL, LDLDIRECT in the last 72 hours. Thyroid Function Tests: No results for input(s): TSH, T4TOTAL, FREET4, T3FREE,  THYROIDAB in the last 72 hours. Anemia Panel: No results for input(s): VITAMINB12, FOLATE, FERRITIN, TIBC, IRON, RETICCTPCT in the last 72 hours. Urine analysis:    Component Value Date/Time   COLORURINE YELLOW 06/06/2019 1251   APPEARANCEUR CLEAR 06/06/2019 1251   LABSPEC 1.011 06/06/2019 1251   PHURINE 6.0 06/06/2019 1251   GLUCOSEU 50 (A) 06/06/2019 1251   HGBUR SMALL (A) 06/06/2019 1251   BILIRUBINUR NEGATIVE 06/06/2019 1251   KETONESUR NEGATIVE 06/06/2019 1251   PROTEINUR 100 (A) 06/06/2019 1251   NITRITE NEGATIVE 06/06/2019 1251   LEUKOCYTESUR NEGATIVE 06/06/2019 1251    Radiological Exams on Admission: DG Chest 2 View  Result Date: 12/25/2020 CLINICAL DATA:  Shortness of breath. EXAM: CHEST - 2 VIEW COMPARISON:  June 06, 2019 FINDINGS: Decreased lung volumes are seen which is likely, in part, secondary to the degree of patient inspiration. Mild atelectasis is seen within the medial aspect of the right lung base. There is no evidence of a pleural effusion or pneumothorax. Stable elevation of the right hemidiaphragm is seen. The cardiac silhouette is mildly enlarged. The visualized skeletal structures are unremarkable. IMPRESSION: Mild right basilar atelectasis. Electronically Signed   By: Virgina Norfolk M.D.   On: 12/25/2020 19:39    EKG: Independently reviewed.  Assessment/Plan Principal Problem:   New onset of congestive heart failure (HCC) Active Problems:   Diabetes mellitus ([place])   Essential hypertension   Noncompliance with medication regimen   Positive for macroalbuminuria   CKD (chronic kidney disease) stage 3, GFR 30-59 ml/min (HCC)    1. Edema - 1. Suspect a combination of new onset CHF as well as possibly the nephrotic syndrome. 2. BNP elevation of 2500 specifically is worrisome for new onset CHF 3. CHF pathway 4. Only 600cc initial UOP after Lasix 17m so gave another 441m5. Strict intake and output 6. Repeat BMP in AM 7. Re-evaluate lasix dosing in  AM depending on UOP and BMP 8. 2d echo 9. Tele monitor 2. Proteinuria - 1. ? Nephrotic syndrome 2. Will start 24h urine collection for protein to confirm 3. Start lisinopril 4. Daily BMP while on lasix 5. May need nephrology consult vs follow up, though most likely suspect that his renal disease is probably due to HTN / DM being poorly controlled 3. CKD - 1. Creat earlier this month was 1.5, now 1.7 today 2. Monitor with daily BMPs 3. Starting lisinopril in this patient due to concern for proteinuria.  But needs close monitoring of creatinine when starting this med. 4. DM2 - 1. Hold home metformin 2. SSI mod scale AC/HS 5. HTN - 1. Start lisinopril  DVT prophylaxis: Lovenox Code Status: Full Family Communication: No family in room Disposition Plan: Home after CHF work up and diuresis Consults called: None Admission status: Place in obs, if new onset CHF confirmed on Echo, should meet IP criteria for new onset of CHF.  GARDNER, JARED M.Jerilynn MagesO [facility]  How to contact the TRSt. [facility] Hospital - Eurekattending or Consulting provider 7AParksr covering provider during after hours 7PFentonfor this patient?  1. Check the care team in [facility]d look for a) attending/consulting TRH provider listed and b) the TRManatee Surgical Center LLCeam listed 2. Log into [email or web address]  Amion Physician Scheduling and messaging for groups and whole hospitals  On call and physician scheduling software for group practices, residents, hospitalists and other medical providers for call, clinic, rotation and shift schedules. OnCall Enterprise is a hospital-wide system for scheduling doctors and paging doctors on call. EasyPlot is for scientific plotting and data analysis.  [email or web address]  and use [facility]'s universal password to access. If you do not have the password, please contact the hospital operator.  3. Locate the TRBaptist Health Medical Center Van Burenrovider you are looking for under [facility] and page to a number that you can be directly reached. 4. If you  still have difficulty reaching the provider, please page the DOOur Lady Of The Lake Regional Medical CenterDirector on Call) for the Hospitalists listed on amion for assistance.  12/26/2020, 3:32 AM

## 2020-12-26 NOTE — ED Notes (Signed)
Breakfast Orders Placed °

## 2020-12-26 NOTE — Consult Note (Signed)
Cardiology Consultation:   Patient ID: Scott Greer MRN: [id number]; DOB: December 01, 1955  Admit date: 12/25/2020 Date of Consult: 12/26/2020  PCP:  [name], MD   [place]  Cardiologist:  New to [place]  Advanced Practice Provider:  No care team member to display Electrophysiologist:  None  Advanced Heart Failure Clinic:  None        Patient Profile:   Scott Greer is a 65 y.o. male with a hx of type 2 diabetes mellitus, hypertension, osteomyelitis who is being seen today for the evaluation of congestive heart failure at the request of Dr. Rockne Menghini.  History of Present Illness:   Scott Greer is a 65 year old gentleman with a history of hypertension and diabetes mellitus.  He has a history of medical noncompliance.  Echocardiogram performed today reveals severely reduced left ventricular systolic function with an ejection fraction of 25 to 30%.  We were unable to determine diastolic parameters.  There is mild left ventricular hypertrophy.  The right ventricular systolic function is also severely reduced.  There is mildly elevated pulmonary artery pressures. Estimated PA pressure of 42 mmHg.  He has a history of heavy alcohol use at times but states that he does not drink every night.  He smokes "all kinds of things".  He does not get any regular exercise.  He is not worked in 2 years.  He has had poorly controlled diabetes.  He has a toe amputation secondary to osteomyelitis.  His biggest complaint today is of scrotal swelling.   Past Medical History:  Diagnosis Date  . Dehiscence of amputation stump (HCC)    left great toe  . Diabetes mellitus without complication ([place]) 6270   Type II  . Glaucoma 2015  . Hyperlipidemia   . Hypertension 2005  . Osteomyelitis ([place])   . Wears glasses     Past Surgical History:  Procedure Laterality Date  . AMPUTATION Left 06/05/2019   Procedure: LEFT GREAT TOE AMPUTATION;  Surgeon: Newt Minion, MD;   Location: [place];  Service: Orthopedics;  Laterality: Left;  . AMPUTATION Left 07/10/2019   Procedure: LEFT FOOT 1ST RAY AMPUTATION;  Surgeon: Newt Minion, MD;  Location: [place];  Service: Orthopedics;  Laterality: Left;  . AMPUTATION Right 05/25/2020   Procedure: RIGHT GREAT TOE AMPUTATION;  Surgeon: Newt Minion, MD;  Location: [facility];  Service: Orthopedics;  Laterality: Right;  . NO PAST SURGERIES       Home Medications:  Prior to Admission medications   Medication Sig Start Date End Date Taking? Authorizing Provider  atorvastatin (LIPITOR) 10 MG tablet Take 1 tablet (10 mg total) by mouth daily. 12/14/20  Yes [name], Cari S, PA-C  Blood Glucose Monitoring Suppl (TRUE METRIX METER) w/Device KIT Use as directed 10/23/19  Yes [name], MD  glucose blood (TRUE METRIX BLOOD GLUCOSE TEST) test strip Use as instructed 10/23/19  Yes [name], MD  lisinopril (ZESTRIL) 10 MG tablet Take 1 tablet (10 mg total) by mouth daily. 12/14/20  Yes [name], Cari S, PA-C  metFORMIN (GLUCOPHAGE) 500 MG tablet Take 1 tablet (500 mg total) by mouth 2 (two) times daily with a meal. 12/14/20  Yes [name], Cari S, PA-C  TRUEplus Lancets 28G MISC Use as directed 10/23/19  Yes [name], MD  Vitamin D, Ergocalciferol, (DRISDOL) 1.25 MG (50000 UNIT) CAPS capsule Take 1 capsule (50,000 Units total) by mouth every 7 (seven) days. 12/16/20  Yes [name], [name],  PA-C    Inpatient Medications: Scheduled Meds: . atorvastatin  10 mg Oral Daily  . enoxaparin (LOVENOX) injection  40 mg Subcutaneous Q24H  . furosemide  40 mg Intravenous BID  . insulin aspart  0-15 Units Subcutaneous TID WC  . insulin aspart  0-5 Units Subcutaneous QHS  . lisinopril  10 mg Oral Daily  . sodium chloride flush  3 mL Intravenous Q12H   Continuous Infusions: . sodium chloride     PRN Meds: sodium chloride, acetaminophen, ondansetron (ZOFRAN) IV, sodium chloride flush  Allergies:    Allergies  Allergen Reactions  .  Bee Venom Hives, Itching and Swelling    Social History:   Social History   Socioeconomic History  . Marital status: Legally Separated    Spouse name: Nira Conn  . Number of children: 2  . Years of education: Not on file  . Highest education level: Associate degree: occupational, Hotel manager, or vocational program  Occupational History  . Not on file  Tobacco Use  . Smoking status: Current Every Day Smoker    Types: Cigars  . Smokeless tobacco: Former Systems developer    Types: Chew  . Tobacco comment: 6 daily  Vaping Use  . Vaping Use: Never used  Substance and Sexual Activity  . Alcohol use: Yes    Comment: occasional  . Drug use: Yes    Types: Marijuana    Comment: ocassional - last time 05/08/2020  . Sexual activity: Not Currently  Other Topics Concern  . Not on file  Social History Narrative   Out of prison for 2 months.   Lives at home with his wife.   Social Determinants of Health   Financial Resource Strain: Not on file  Food Insecurity: Not on file  Transportation Needs: Not on file  Physical Activity: Not on file  Stress: Not on file  Social Connections: Not on file  Intimate Partner Violence: Not on file    Family History:    Family History  Problem Relation Age of Onset  . Stroke Mother   . Diabetes Mother        Toward end of life     ROS:  Please see the history of present illness.   All other ROS reviewed and negative.     Physical Exam/Data:   Vitals:   12/26/20 0600 12/26/20 0700 12/26/20 1100 12/26/20 1358  BP: (!) 159/110 131/85 (!) 131/99   Pulse:  95 95   Resp: 18 13 18    Temp:      TempSrc:      SpO2:  98% 98%   Weight:    114.3 kg  Height:    6' 3"  (1.905 m)    Intake/Output Summary (Last 24 hours) at 12/26/2020 1613 Last data filed at 12/26/2020 0615 Gross per 24 hour  Intake -  Output 1725 ml  Net -1725 ml   Last 3 Weights 12/26/2020 12/14/2020 07/18/2020  Weight (lbs) 252 lb 251 lb 193 lb  Weight (kg) 114.306 kg 113.853 kg 87.544 kg      Body mass index is 31.5 kg/m.  General:  Middle age man,  Appears older than stated age  65: normal Lymph: no adenopathy Neck: no JVD Endocrine:  No thryomegaly Vascular: No carotid bruits; FA pulses 2+ bilaterally without bruits  Cardiac:  normal S1, S2; soft systolic murmur  Lungs:  clear to auscultation bilaterally, no wheezing, rhonchi or rales  Abd: soft, nontender, no hepatomegaly  Ext: 3+ tense edema in legs , abdomen  ,  Edema in scrotum  Musculoskeletal:  No deformities, BUE and BLE strength normal and equal Skin: warm and dry  Neuro:  CNs 2-12 intact, no focal abnormalities noted Psych:  Normal affect   EKG:  The EKG was personally reviewed and demonstrates:    NSR ,  No ST or T wave changes.  Occasional PVCs   Telemetry:  Telemetry was personally reviewed and demonstrates:    Relevant CV Studies:   Laboratory Data:  High Sensitivity Troponin:   Recent Labs  Lab 12/25/20 1906 12/25/20 2251  TROPONINIHS 25* 28*     Chemistry Recent Labs  Lab 12/25/20 1906 12/26/20 0730  NA 135 139  K 4.8 4.5  CL 102 104  CO2 25 24  GLUCOSE 238* 147*  BUN 27* 26*  CREATININE 1.71* 1.62*  CALCIUM 8.8* 9.2  GFRNONAA 45* 48*  ANIONGAP 8 11    Recent Labs  Lab 12/25/20 0018  PROT 6.2*  ALBUMIN 3.1*  AST 25  ALT 30  ALKPHOS 179*  BILITOT 1.1   Hematology Recent Labs  Lab 12/25/20 1906  WBC 5.5  RBC 4.49  HGB 12.0*  HCT 38.3*  MCV 85.3  MCH 26.7  MCHC 31.3  RDW 16.1*  PLT 302   BNP Recent Labs  Lab 12/25/20 0019  BNP 2,579.0*    DDimer No results for input(s): DDIMER in the last 168 hours.   Radiology/Studies:  DG Chest 2 View  Result Date: 12/25/2020 CLINICAL DATA:  Shortness of breath. EXAM: CHEST - 2 VIEW COMPARISON:  June 06, 2019 FINDINGS: Decreased lung volumes are seen which is likely, in part, secondary to the degree of patient inspiration. Mild atelectasis is seen within the medial aspect of the right lung base. There is no  evidence of a pleural effusion or pneumothorax. Stable elevation of the right hemidiaphragm is seen. The cardiac silhouette is mildly enlarged. The visualized skeletal structures are unremarkable. IMPRESSION: Mild right basilar atelectasis. Electronically Signed   By: Virgina Norfolk M.D.   On: 12/25/2020 19:39   ECHOCARDIOGRAM COMPLETE  Result Date: 12/26/2020    ECHOCARDIOGRAM REPORT   Patient Name:   Scott Greer Date of Exam: 12/26/2020 Medical Rec #:  [id number]      Height:       75.0 in Accession #:    [id number]     Weight:       251.0 lb Date of Birth:  09-Oct-1956     BSA:          2.417 m Patient Age:    42 years       BP:           131/85 mmHg Patient Gender: M              HR:           95 bpm. Exam Location:  Inpatient Procedure: 2D Echo, Cardiac Doppler and Color Doppler Indications:    CHF-Acute Diastolic I96.78  History:        Patient has no prior history of Echocardiogram examinations.                 Risk Factors:Current Smoker, Hypertension, Diabetes and                 Dyslipidemia.  Sonographer:    Dustin Flock Referring Phys: [place]  1. Left ventricular ejection fraction, by estimation, is 25 to 30%. The left ventricle has severely decreased function. The left ventricle demonstrates  global hypokinesis. The left ventricular internal cavity size was mildly dilated. There is mild left ventricular hypertrophy. Left ventricular diastolic parameters are indeterminate.  2. Right ventricular systolic function is severely reduced. The right ventricular size is mildly enlarged. There is mildly elevated pulmonary artery systolic pressure.  3. Right atrial size was mildly dilated.  4. The mitral valve is abnormal. Mild mitral valve regurgitation.  5. The aortic valve is grossly normal. Aortic valve regurgitation is not visualized. Mild to moderate aortic valve sclerosis/calcification is present, without any evidence of aortic stenosis.  6. The inferior vena cava is  dilated in size with <50% respiratory variability, suggesting right atrial pressure of 15 mmHg. FINDINGS  Left Ventricle: Left ventricular ejection fraction, by estimation, is 25 to 30%. The left ventricle has severely decreased function. The left ventricle demonstrates global hypokinesis. The left ventricular internal cavity size was mildly dilated. There is mild left ventricular hypertrophy. Left ventricular diastolic parameters are indeterminate. Right Ventricle: The right ventricular size is mildly enlarged. Right vetricular wall thickness was not assessed. Right ventricular systolic function is severely reduced. There is mildly elevated pulmonary artery systolic pressure. The tricuspid regurgitant velocity is 2.59 m/s, and with an assumed right atrial pressure of 15 mmHg, the estimated right ventricular systolic pressure is 78.6 mmHg. Left Atrium: Left atrial size was normal in size. Right Atrium: Right atrial size was mildly dilated. Pericardium: There is no evidence of pericardial effusion. Mitral Valve: The mitral valve is abnormal. There is mild thickening of the mitral valve leaflet(s). Mild mitral valve regurgitation. Tricuspid Valve: The tricuspid valve is normal in structure. Tricuspid valve regurgitation is trivial. Aortic Valve: The aortic valve is grossly normal. Aortic valve regurgitation is not visualized. Mild to moderate aortic valve sclerosis/calcification is present, without any evidence of aortic stenosis. Pulmonic Valve: The pulmonic valve was not well visualized. Pulmonic valve regurgitation is not visualized. Aorta: The aortic root is normal in size and structure. Venous: The inferior vena cava is dilated in size with less than 50% respiratory variability, suggesting right atrial pressure of 15 mmHg. IAS/Shunts: The interatrial septum was not assessed.  LEFT VENTRICLE PLAX 2D LVIDd:         5.40 cm      Diastology LVIDs:         4.70 cm      LV e' medial:    3.81 cm/s LV PW:         1.40 cm       LV E/e' medial:  18.3 LV IVS:        1.30 cm      LV e' lateral:   7.40 cm/s LVOT diam:     2.50 cm      LV E/e' lateral: 9.4 LV SV:         56 LV SV Index:   23 LVOT Area:     4.91 cm  LV Volumes (MOD) LV vol d, MOD A4C: 190.0 ml LV vol s, MOD A4C: 115.0 ml LV SV MOD A4C:     190.0 ml RIGHT VENTRICLE RV Basal diam:  4.50 cm RV S prime:     5.66 cm/s TAPSE (M-mode): 2.2 cm LEFT ATRIUM             Index       RIGHT ATRIUM           Index LA diam:        4.60 cm 1.90 cm/m  RA Area:     19.80  cm LA Vol (A2C):   49.0 ml 20.27 ml/m RA Volume:   68.00 ml  28.14 ml/m LA Vol (A4C):   41.0 ml 16.96 ml/m LA Biplane Vol: 44.5 ml 18.41 ml/m  AORTIC VALVE LVOT Vmax:   58.10 cm/s LVOT Vmean:  38.600 cm/s LVOT VTI:    0.115 m  AORTA Ao Root diam: 3.00 cm MITRAL VALVE               TRICUSPID VALVE MV Area (PHT): 3.08 cm    TR Peak grad:   26.8 mmHg MV Decel Time: 246 msec    TR Vmax:        259.00 cm/s MV E velocity: 69.80 cm/s MV A velocity: 30.00 cm/s  SHUNTS MV E/A ratio:  2.33        Systemic VTI:  0.12 m                            Systemic Diam: 2.50 cm Dorris Carnes MD Electronically signed by Dorris Carnes MD Signature Date/Time: 12/26/2020/3:14:39 PM    Final      Assessment and Plan:   1. Acute on chronic combined systolic and diastolic congestive heart failure: Scott Greer presents to the emergency room with progressive weakness and swelling.  He has had hypertension for quite some time but he has not been taking medications for over 2 years.  In addition he eats an unrestricted diet and drinks a fair amount of alcohol.  In addition he continues to smoke.  I have reviewed the echocardiogram and his ejection fraction appears to be around 20%.  We will continue Lasix.  He has at least moderate chronic kidney disease with a presenting creatinine of 1.71.  He also has poorly controlled diabetes.  We will continue to watch his renal function closely. His creatinine remains fairly stable we will add Entresto He  received a dose of Lisinopril this am .  Will need to wait 36 hour washout prior to starting   Consider aldactone , coreg ( after diuresis) .    Risk Assessment/Risk Scores:        New York Heart Association (NYHA) Functional Class NYHA Class IV        For questions or updates, please contact [facility] Please consult [email or web address] for contact info under    Signed, Mertie Moores, MD  12/26/2020 4:13 PM

## 2020-12-26 NOTE — ED Notes (Signed)
Pt switched to a hospital bed. 

## 2020-12-27 DIAGNOSIS — N1831 Chronic kidney disease, stage 3a: Secondary | ICD-10-CM | POA: Diagnosis not present

## 2020-12-27 DIAGNOSIS — N179 Acute kidney failure, unspecified: Secondary | ICD-10-CM | POA: Diagnosis not present

## 2020-12-27 DIAGNOSIS — I5021 Acute systolic (congestive) heart failure: Secondary | ICD-10-CM | POA: Diagnosis not present

## 2020-12-27 DIAGNOSIS — I5043 Acute on chronic combined systolic (congestive) and diastolic (congestive) heart failure: Secondary | ICD-10-CM | POA: Diagnosis not present

## 2020-12-27 DIAGNOSIS — I10 Essential (primary) hypertension: Secondary | ICD-10-CM | POA: Diagnosis not present

## 2020-12-27 DIAGNOSIS — I509 Heart failure, unspecified: Secondary | ICD-10-CM | POA: Diagnosis not present

## 2020-12-27 DIAGNOSIS — R809 Proteinuria, unspecified: Secondary | ICD-10-CM | POA: Diagnosis not present

## 2020-12-27 DIAGNOSIS — Z9114 Patient's other noncompliance with medication regimen: Secondary | ICD-10-CM | POA: Diagnosis not present

## 2020-12-27 DIAGNOSIS — E1121 Type 2 diabetes mellitus with diabetic nephropathy: Secondary | ICD-10-CM | POA: Diagnosis not present

## 2020-12-27 LAB — BASIC METABOLIC PANEL
Anion gap: 8 (ref 5–15)
BUN: 25 mg/dL — ABNORMAL HIGH (ref 8–23)
CO2: 19 mmol/L — ABNORMAL LOW (ref 22–32)
Calcium: 7.4 mg/dL — ABNORMAL LOW (ref 8.9–10.3)
Chloride: 111 mmol/L (ref 98–111)
Creatinine, Ser: 1.22 mg/dL (ref 0.61–1.24)
GFR, Estimated: >60 mL/min (ref >60)
Glucose, Bld: 137 mg/dL — ABNORMAL HIGH (ref 70–99)
Potassium: 4.1 mmol/L (ref 3.5–5.1)
Sodium: 138 mmol/L (ref 135–145)

## 2020-12-27 LAB — GLUCOSE, CAPILLARY
Glucose-Capillary: 172 mg/dL — ABNORMAL HIGH (ref 70–99)
Glucose-Capillary: 208 mg/dL — ABNORMAL HIGH (ref 70–99)

## 2020-12-27 LAB — CBG MONITORING, ED
Glucose-Capillary: 152 mg/dL — ABNORMAL HIGH (ref 70–99)
Glucose-Capillary: 171 mg/dL — ABNORMAL HIGH (ref 70–99)

## 2020-12-27 MED ORDER — SACUBITRIL-VALSARTAN 49-51 MG PO TABS
1.0000 | ORAL_TABLET | Freq: Two times a day (BID) | ORAL | Status: DC
  Administered 2020-12-27 – 2021-01-02 (×12): 1 via ORAL
  Filled 2020-12-27 (×14): qty 1

## 2020-12-27 NOTE — ED Notes (Signed)
Lunch Tray Ordered @ 1006.  

## 2020-12-27 NOTE — Progress Notes (Addendum)
Off going RN endorsed to incoming RN that 24 hour urine will conclude at 2130.

## 2020-12-27 NOTE — Progress Notes (Signed)
Patient ID: Scott Greer, male   DOB: [birth year], 65 y.o.   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  On presentation, BNP was 2579 with chest x-ray showing mild right basilar atelectasis.  He was started on IV Lasix.  Cardiology was consulted  Assessment & Plan:   Acute systolic heart failure -Echo showed EF of 25 to 30% with left ventricular global hypokinesis and severely decreased function -Strict input and output.  Daily weights.  Fluid restriction. -Cardiology following and managing diuretics.  Currently on IV Lasix and Sherryll Burger is being started. -Still has significant edema and require inpatient IV diuresis  Diabetes mellitus type 2 uncontrolled with hyperglycemia -A1c 8.4 on 12/14/2020.  Continue CBGs with SSI.  Metformin on hold  Essential hypertension Noncompliance -Nonadherent to medications.  Monitor blood pressure.  Intermittently on the higher side.  Continue Lasix and Entresto  Positive for microalbuminuria -24-hour urine collection will be started this morning as per nursing staff   DVT prophylaxis: Lovenox Code Status: Full Family Communication: None Disposition Plan: Status is: Inpatient  Remains inpatient appropriate because:Inpatient level of care appropriate due to severity of illness   Dispo: The patient is from: Home              Anticipated d/c is to: Home              Anticipated d/c date is: > 3 days              Patient currently is not medically stable to d/c.   Difficult to place patient No  Consultants: Cardiology  Procedures: Echo  Antimicrobials: None   Subjective: Patient  seen and examined at bedside.  Still complains of shortness of breath with exertion.  No overnight fever, vomiting or chest pain reported.  Objective: Vitals:   12/27/20 0200 12/27/20 0642 12/27/20 0726 12/27/20 0956  BP: (!) 168/117 (!) 149/133  127/88  Pulse: (!) 105 (!) 102  95  Resp: (!) 27 (!) 22  (!) 23  Temp:      TempSrc:      SpO2: 99% 100%  100%  Weight:   116.1 kg   Height:   6\' 3"  (1.905 m)    No intake or output data in the 24 hours ending 12/27/20 1058 Filed Weights   12/26/20 1358 12/27/20 0726  Weight: 114.3 kg 116.1 kg    Examination:  General exam: Appears calm and comfortable.  Looks chronically ill. Respiratory system: Bilateral decreased breath sounds at bases with basilar crackles and tachypnea Cardiovascular system: S1 & S2 heard, intermittently tachycardic Gastrointestinal system: Abdomen is nondistended, soft and nontender. Normal bowel sounds heard. Extremities: No cyanosis, clubbing; 2-3+ pitting edema present Central nervous system: Alert and oriented. No focal neurological deficits. Moving extremities Skin: No rashes, lesions or ulcers Psychiatry: Flat affect    Data Reviewed: I have personally reviewed following labs and imaging studies  CBC: Recent Labs  Lab 12/25/20 1906  WBC 5.5  HGB 12.0*  HCT 38.3*  MCV 85.3  PLT 302   Basic Metabolic Panel: Recent Labs  Lab 12/25/20 1906 12/26/20 0730 12/27/20 0500  NA 135 139 138  K 4.8 4.5  4.1  CL 102 104 111  CO2 25 24 19*  GLUCOSE 238* 147* 137*  BUN 27* 26* 25*  CREATININE 1.71* 1.62* 1.22  CALCIUM 8.8* 9.2 7.4*   GFR: Estimated Creatinine Clearance: 86.2 mL/min (by C-G formula based on SCr of 1.22 mg/dL). Liver Function Tests: Recent Labs  Lab 12/25/20 0018  AST 25  ALT 30  ALKPHOS 179*  BILITOT 1.1  PROT 6.2*  ALBUMIN 3.1*   No results for input(s): LIPASE, AMYLASE in the last 168 hours. No results for input(s): AMMONIA in the last 168 hours. Coagulation Profile: No  results for input(s): INR, PROTIME in the last 168 hours. Cardiac Enzymes: No results for input(s): CKTOTAL, CKMB, CKMBINDEX, TROPONINI in the last 168 hours. BNP (last 3 results) No results for input(s): PROBNP in the last 8760 hours. HbA1C: No results for input(s): HGBA1C in the last 72 hours. CBG: Recent Labs  Lab 12/26/20 0751 12/26/20 1306 12/26/20 1749 12/26/20 2129 12/27/20 0730  GLUCAP 126* 173* 114* 163* 152*   Lipid Profile: No results for input(s): CHOL, HDL, LDLCALC, TRIG, CHOLHDL, LDLDIRECT in the last 72 hours. Thyroid Function Tests: No results for input(s): TSH, T4TOTAL, FREET4, T3FREE, THYROIDAB in the last 72 hours. Anemia Panel: No results for input(s): VITAMINB12, FOLATE, FERRITIN, TIBC, IRON, RETICCTPCT in the last 72 hours. Sepsis Labs: No results for input(s): PROCALCITON, LATICACIDVEN in the last 168 hours.  Recent Results (from the past 240 hour(s))  SARS CORONAVIRUS 2 (TAT 6-24 HRS) Nasopharyngeal Nasopharyngeal Swab     Status: None   Collection Time: 12/26/20  2:09 AM   Specimen: Nasopharyngeal Swab  Result Value Ref Range Status   SARS Coronavirus 2 NEGATIVE NEGATIVE Final    Comment: (NOTE) SARS-CoV-2 target nucleic acids are NOT DETECTED.  The SARS-CoV-2 RNA is generally detectable in upper and lower respiratory specimens during the acute phase of infection. Negative results do not preclude SARS-CoV-2 infection, do not rule out co-infections with other pathogens, and should not be used as the sole basis for treatment or other patient management decisions. Negative results must be combined with clinical observations, patient history, and epidemiological information. The expected result is Negative.  Fact Sheet for Patients: HairSlick.no  Fact Sheet for Healthcare Providers: quierodirigir.com  This test is not yet approved or cleared by the [place] FDA and  has been authorized  for detection and/or diagnosis of SARS-CoV-2 by FDA under an Emergency Use Authorization (EUA). This EUA will remain  in effect (meaning this test can be used) for the duration of the COVID-19 declaration under Se ction 564(b)(1) of the Act, 21 U.S.C. section 360bbb-3(b)(1), unless the authorization is terminated or revoked sooner.  Performed at [facility], [address] [place], [place] [address]          Radiology Studies: DG Chest 2 View  Result Date: 12/25/2020 CLINICAL DATA:  Shortness of breath. EXAM: CHEST - 2 VIEW COMPARISON:  June 06, 2019 FINDINGS: Decreased lung volumes are seen which is likely, in part, secondary to the degree of patient inspiration. Mild atelectasis is seen within the medial aspect of the right lung base. There is no evidence of a pleural effusion or pneumothorax. Stable elevation of the right hemidiaphragm is seen. The cardiac silhouette is mildly enlarged. The visualized skeletal structures are unremarkable. IMPRESSION: Mild right basilar atelectasis. Electronically Signed   By: Aram Candela M.D.   On: 12/25/2020 19:39   ECHOCARDIOGRAM COMPLETE  Result Date: 12/26/2020    ECHOCARDIOGRAM REPORT  Patient Name:   KENO CARAWAY Date of Exam: 12/26/2020 Medical Rec #:  [id number]      Height:       75.0 in Accession #:    [id number]     Weight:       251.0 lb Date of Birth:  08/14/1956     BSA:          2.417 m Patient Age:    64 years       BP:           131/85 mmHg Patient Gender: M              HR:           95 bpm. Exam Location:  Inpatient Procedure: 2D Echo, Cardiac Doppler and Color Doppler Indications:    CHF-Acute Diastolic I50.31  History:        Patient has no prior history of Echocardiogram examinations.                 Risk Factors:Current Smoker, Hypertension, Diabetes and                 Dyslipidemia.  Sonographer:    Lavenia Atlas Referring Phys: [phone] JARED M GARDNER IMPRESSIONS  1. Left ventricular ejection fraction, by  estimation, is 25 to 30%. The left ventricle has severely decreased function. The left ventricle demonstrates global hypokinesis. The left ventricular internal cavity size was mildly dilated. There is mild left ventricular hypertrophy. Left ventricular diastolic parameters are indeterminate.  2. Right ventricular systolic function is severely reduced. The right ventricular size is mildly enlarged. There is mildly elevated pulmonary artery systolic pressure.  3. Right atrial size was mildly dilated.  4. The mitral valve is abnormal. Mild mitral valve regurgitation.  5. The aortic valve is grossly normal. Aortic valve regurgitation is not visualized. Mild to moderate aortic valve sclerosis/calcification is present, without any evidence of aortic stenosis.  6. The inferior vena cava is dilated in size with <50% respiratory variability, suggesting right atrial pressure of 15 mmHg. FINDINGS  Left Ventricle: Left ventricular ejection fraction, by estimation, is 25 to 30%. The left ventricle has severely decreased function. The left ventricle demonstrates global hypokinesis. The left ventricular internal cavity size was mildly dilated. There is mild left ventricular hypertrophy. Left ventricular diastolic parameters are indeterminate. Right Ventricle: The right ventricular size is mildly enlarged. Right vetricular wall thickness was not assessed. Right ventricular systolic function is severely reduced. There is mildly elevated pulmonary artery systolic pressure. The tricuspid regurgitant velocity is 2.59 m/s, and with an assumed right atrial pressure of 15 mmHg, the estimated right ventricular systolic pressure is 41.8 mmHg. Left Atrium: Left atrial size was normal in size. Right Atrium: Right atrial size was mildly dilated. Pericardium: There is no evidence of pericardial effusion. Mitral Valve: The mitral valve is abnormal. There is mild thickening of the mitral valve leaflet(s). Mild mitral valve regurgitation.  Tricuspid Valve: The tricuspid valve is normal in structure. Tricuspid valve regurgitation is trivial. Aortic Valve: The aortic valve is grossly normal. Aortic valve regurgitation is not visualized. Mild to moderate aortic valve sclerosis/calcification is present, without any evidence of aortic stenosis. Pulmonic Valve: The pulmonic valve was not well visualized. Pulmonic valve regurgitation is not visualized. Aorta: The aortic root is normal in size and structure. Venous: The inferior vena cava is dilated in size with less than 50% respiratory variability, suggesting right atrial pressure of 15 mmHg. IAS/Shunts: The interatrial septum was not assessed.  LEFT VENTRICLE PLAX 2D LVIDd:         5.40 cm      Diastology LVIDs:         4.70 cm      LV e' medial:    3.81 cm/s LV PW:         1.40 cm      LV E/e' medial:  18.3 LV IVS:        1.30 cm      LV e' lateral:   7.40 cm/s LVOT diam:     2.50 cm      LV E/e' lateral: 9.4 LV SV:         56 LV SV Index:   23 LVOT Area:     4.91 cm  LV Volumes (MOD) LV vol d, MOD A4C: 190.0 ml LV vol s, MOD A4C: 115.0 ml LV SV MOD A4C:     190.0 ml RIGHT VENTRICLE RV Basal diam:  4.50 cm RV S prime:     5.66 cm/s TAPSE (M-mode): 2.2 cm LEFT ATRIUM             Index       RIGHT ATRIUM           Index LA diam:        4.60 cm 1.90 cm/m  RA Area:     19.80 cm LA Vol (A2C):   49.0 ml 20.27 ml/m RA Volume:   68.00 ml  28.14 ml/m LA Vol (A4C):   41.0 ml 16.96 ml/m LA Biplane Vol: 44.5 ml 18.41 ml/m  AORTIC VALVE LVOT Vmax:   58.10 cm/s LVOT Vmean:  38.600 cm/s LVOT VTI:    0.115 m  AORTA Ao Root diam: 3.00 cm MITRAL VALVE               TRICUSPID VALVE MV Area (PHT): 3.08 cm    TR Peak grad:   26.8 mmHg MV Decel Time: 246 msec    TR Vmax:        259.00 cm/s MV E velocity: 69.80 cm/s MV A velocity: 30.00 cm/s  SHUNTS MV E/A ratio:  2.33        Systemic VTI:  0.12 m                            Systemic Diam: 2.50 cm Dietrich Pates MD Electronically signed by Dietrich Pates MD Signature Date/Time:  12/26/2020/3:14:39 PM    Final         Scheduled Meds: . atorvastatin  10 mg Oral Daily  . enoxaparin (LOVENOX) injection  40 mg Subcutaneous Q24H  . furosemide  40 mg Intravenous BID  . insulin aspart  0-15 Units Subcutaneous TID WC  . insulin aspart  0-5 Units Subcutaneous QHS  . sacubitril-valsartan  1 tablet Oral BID  . sodium chloride flush  3 mL Intravenous Q12H   Continuous Infusions: . sodium chloride            Glade Lloyd, MD [facility] 12/27/2020, 10:58 AM

## 2020-12-27 NOTE — Progress Notes (Signed)
Progress Note  Patient Name: ZAIDYN CLAIRE Date of Encounter: 12/27/2020  [facility] Cardiologist: Kristeen Miss, MD   Subjective   Pt was admitted with CHF Has diuresed 1.7 liters so far BP is slightly better  He received a dose of Lisinopril yesterday ,  The plan is to start entresto tonight ( after 36 hour washout)   He is breathing better.  Still has lots of abdomina edema , scrotal edema and 2-3+ edema up to his thighs.    Inpatient Medications    Scheduled Meds: . atorvastatin  10 mg Oral Daily  . enoxaparin (LOVENOX) injection  40 mg Subcutaneous Q24H  . furosemide  40 mg Intravenous BID  . insulin aspart  0-15 Units Subcutaneous TID WC  . insulin aspart  0-5 Units Subcutaneous QHS  . irbesartan  150 mg Oral Daily  . sodium chloride flush  3 mL Intravenous Q12H   Continuous Infusions: . sodium chloride     PRN Meds: sodium chloride, acetaminophen, ondansetron (ZOFRAN) IV, sodium chloride flush   Vital Signs    Vitals:   12/27/20 0100 12/27/20 0200 12/27/20 0642 12/27/20 0726  BP: (!) 153/101 (!) 168/117 (!) 149/133   Pulse: 98 (!) 105 (!) 102   Resp: (!) 28 (!) 27 (!) 22   Temp:      TempSrc:      SpO2: 100% 99% 100%   Weight:    116.1 kg  Height:    6\' 3"  (1.905 m)   No intake or output data in the 24 hours ending 12/27/20 0838 Last 3 Weights 12/27/2020 12/26/2020 12/14/2020  Weight (lbs) 256 lb 252 lb 251 lb  Weight (kg) 116.121 kg 114.306 kg 113.853 kg      Telemetry     NSR  - Personally Reviewed  ECG     - Personally Reviewed  Physical Exam   GEN:  middle age male,  nad Neck: No JVD Cardiac: RRR, soft systolic murmur  Respiratory: Clear to auscultation bilaterally. GI: Soft, nontender, non-distended  MS: No edema; No deformity. Neuro:  Nonfocal  Psych: Normal affect   Labs    High Sensitivity Troponin:   Recent Labs  Lab 12/25/20 1906 12/25/20 2251  TROPONINIHS 25* 28*      Chemistry Recent Labs  Lab 12/25/20 0018  12/25/20 1906 12/26/20 0730  NA  --  135 139  K  --  4.8 4.5  CL  --  102 104  CO2  --  25 24  GLUCOSE  --  238* 147*  BUN  --  27* 26*  CREATININE  --  1.71* 1.62*  CALCIUM  --  8.8* 9.2  PROT 6.2*  --   --   ALBUMIN 3.1*  --   --   AST 25  --   --   ALT 30  --   --   ALKPHOS 179*  --   --   BILITOT 1.1  --   --   GFRNONAA  --  45* 48*  ANIONGAP  --  8 11     Hematology Recent Labs  Lab 12/25/20 1906  WBC 5.5  RBC 4.49  HGB 12.0*  HCT 38.3*  MCV 85.3  MCH 26.7  MCHC 31.3  RDW 16.1*  PLT 302    BNP Recent Labs  Lab 12/25/20 0019  BNP 2,579.0*     DDimer No results for input(s): DDIMER in the last 168 hours.   Radiology    DG Chest  2 View  Result Date: 12/25/2020 CLINICAL DATA:  Shortness of breath. EXAM: CHEST - 2 VIEW COMPARISON:  June 06, 2019 FINDINGS: Decreased lung volumes are seen which is likely, in part, secondary to the degree of patient inspiration. Mild atelectasis is seen within the medial aspect of the right lung base. There is no evidence of a pleural effusion or pneumothorax. Stable elevation of the right hemidiaphragm is seen. The cardiac silhouette is mildly enlarged. The visualized skeletal structures are unremarkable. IMPRESSION: Mild right basilar atelectasis. Electronically Signed   By: Aram Candela M.D.   On: 12/25/2020 19:39   ECHOCARDIOGRAM COMPLETE  Result Date: 12/26/2020    ECHOCARDIOGRAM REPORT   Patient Name:   DEZMIN KITTELSON Date of Exam: 12/26/2020 Medical Rec #:  [id number]      Height:       75.0 in Accession #:    [id number]     Weight:       251.0 lb Date of Birth:  04/18/1956     BSA:          2.417 m Patient Age:    64 years       BP:           131/85 mmHg Patient Gender: M              HR:           95 bpm. Exam Location:  Inpatient Procedure: 2D Echo, Cardiac Doppler and Color Doppler Indications:    CHF-Acute Diastolic I50.31  History:        Patient has no prior history of Echocardiogram examinations.                  Risk Factors:Current Smoker, Hypertension, Diabetes and                 Dyslipidemia.  Sonographer:    Lavenia Atlas Referring Phys: [phone] JARED M GARDNER IMPRESSIONS  1. Left ventricular ejection fraction, by estimation, is 25 to 30%. The left ventricle has severely decreased function. The left ventricle demonstrates global hypokinesis. The left ventricular internal cavity size was mildly dilated. There is mild left ventricular hypertrophy. Left ventricular diastolic parameters are indeterminate.  2. Right ventricular systolic function is severely reduced. The right ventricular size is mildly enlarged. There is mildly elevated pulmonary artery systolic pressure.  3. Right atrial size was mildly dilated.  4. The mitral valve is abnormal. Mild mitral valve regurgitation.  5. The aortic valve is grossly normal. Aortic valve regurgitation is not visualized. Mild to moderate aortic valve sclerosis/calcification is present, without any evidence of aortic stenosis.  6. The inferior vena cava is dilated in size with <50% respiratory variability, suggesting right atrial pressure of 15 mmHg. FINDINGS  Left Ventricle: Left ventricular ejection fraction, by estimation, is 25 to 30%. The left ventricle has severely decreased function. The left ventricle demonstrates global hypokinesis. The left ventricular internal cavity size was mildly dilated. There is mild left ventricular hypertrophy. Left ventricular diastolic parameters are indeterminate. Right Ventricle: The right ventricular size is mildly enlarged. Right vetricular wall thickness was not assessed. Right ventricular systolic function is severely reduced. There is mildly elevated pulmonary artery systolic pressure. The tricuspid regurgitant velocity is 2.59 m/s, and with an assumed right atrial pressure of 15 mmHg, the estimated right ventricular systolic pressure is 41.8 mmHg. Left Atrium: Left atrial size was normal in size. Right Atrium: Right atrial size was  mildly dilated. Pericardium: There is no evidence of pericardial  effusion. Mitral Valve: The mitral valve is abnormal. There is mild thickening of the mitral valve leaflet(s). Mild mitral valve regurgitation. Tricuspid Valve: The tricuspid valve is normal in structure. Tricuspid valve regurgitation is trivial. Aortic Valve: The aortic valve is grossly normal. Aortic valve regurgitation is not visualized. Mild to moderate aortic valve sclerosis/calcification is present, without any evidence of aortic stenosis. Pulmonic Valve: The pulmonic valve was not well visualized. Pulmonic valve regurgitation is not visualized. Aorta: The aortic root is normal in size and structure. Venous: The inferior vena cava is dilated in size with less than 50% respiratory variability, suggesting right atrial pressure of 15 mmHg. IAS/Shunts: The interatrial septum was not assessed.  LEFT VENTRICLE PLAX 2D LVIDd:         5.40 cm      Diastology LVIDs:         4.70 cm      LV e' medial:    3.81 cm/s LV PW:         1.40 cm      LV E/e' medial:  18.3 LV IVS:        1.30 cm      LV e' lateral:   7.40 cm/s LVOT diam:     2.50 cm      LV E/e' lateral: 9.4 LV SV:         56 LV SV Index:   23 LVOT Area:     4.91 cm  LV Volumes (MOD) LV vol d, MOD A4C: 190.0 ml LV vol s, MOD A4C: 115.0 ml LV SV MOD A4C:     190.0 ml RIGHT VENTRICLE RV Basal diam:  4.50 cm RV S prime:     5.66 cm/s TAPSE (M-mode): 2.2 cm LEFT ATRIUM             Index       RIGHT ATRIUM           Index LA diam:        4.60 cm 1.90 cm/m  RA Area:     19.80 cm LA Vol (A2C):   49.0 ml 20.27 ml/m RA Volume:   68.00 ml  28.14 ml/m LA Vol (A4C):   41.0 ml 16.96 ml/m LA Biplane Vol: 44.5 ml 18.41 ml/m  AORTIC VALVE LVOT Vmax:   58.10 cm/s LVOT Vmean:  38.600 cm/s LVOT VTI:    0.115 m  AORTA Ao Root diam: 3.00 cm MITRAL VALVE               TRICUSPID VALVE MV Area (PHT): 3.08 cm    TR Peak grad:   26.8 mmHg MV Decel Time: 246 msec    TR Vmax:        259.00 cm/s MV E velocity: 69.80  cm/s MV A velocity: 30.00 cm/s  SHUNTS MV E/A ratio:  2.33        Systemic VTI:  0.12 m                            Systemic Diam: 2.50 cm Dietrich Pates MD Electronically signed by Dietrich Pates MD Signature Date/Time: 12/26/2020/3:14:39 PM    Final     Cardiac Studies     Patient Profile     65 y.o. male  With hx of poorly controled HTN, DM, with new dx of CHF    Assessment & Plan    1.  Acute on chronic CHF:    He received 1  dose of lisinopril yesterday morning.  I would like to start [employer].  His renal function remained stable. Anticipate starting carvedilol once he is better diuresed.  He still has significant anasarca including abdominal edema, scrotal edema, thigh and lower leg edema.  He will need several days of IV Lasix. .  2.  Diabetes mellitus: His glucose levels seem to be improving.  3.  Hypertension: His blood pressure slowly improving.      For questions or updates, please contact [facility] Please consult [email or web address] for contact info under        Signed, Kristeen Miss, MD  12/27/2020, 8:38 AM

## 2020-12-27 NOTE — Plan of Care (Signed)

## 2020-12-28 ENCOUNTER — Ambulatory Visit: Payer: Medicaid Other

## 2020-12-28 DIAGNOSIS — I10 Essential (primary) hypertension: Secondary | ICD-10-CM | POA: Diagnosis not present

## 2020-12-28 DIAGNOSIS — E1121 Type 2 diabetes mellitus with diabetic nephropathy: Secondary | ICD-10-CM | POA: Diagnosis not present

## 2020-12-28 DIAGNOSIS — N1831 Chronic kidney disease, stage 3a: Secondary | ICD-10-CM | POA: Diagnosis not present

## 2020-12-28 DIAGNOSIS — R809 Proteinuria, unspecified: Secondary | ICD-10-CM | POA: Diagnosis not present

## 2020-12-28 DIAGNOSIS — Z9114 Patient's other noncompliance with medication regimen: Secondary | ICD-10-CM | POA: Diagnosis not present

## 2020-12-28 DIAGNOSIS — I5043 Acute on chronic combined systolic (congestive) and diastolic (congestive) heart failure: Secondary | ICD-10-CM | POA: Diagnosis not present

## 2020-12-28 DIAGNOSIS — N179 Acute kidney failure, unspecified: Secondary | ICD-10-CM | POA: Diagnosis not present

## 2020-12-28 DIAGNOSIS — I509 Heart failure, unspecified: Secondary | ICD-10-CM | POA: Diagnosis not present

## 2020-12-28 DIAGNOSIS — I5021 Acute systolic (congestive) heart failure: Secondary | ICD-10-CM | POA: Diagnosis not present

## 2020-12-28 LAB — GLUCOSE, CAPILLARY
Glucose-Capillary: 115 mg/dL — ABNORMAL HIGH (ref 70–99)
Glucose-Capillary: 127 mg/dL — ABNORMAL HIGH (ref 70–99)
Glucose-Capillary: 207 mg/dL — ABNORMAL HIGH (ref 70–99)
Glucose-Capillary: 102 mg/dL — ABNORMAL HIGH (ref 70–99)

## 2020-12-28 LAB — BASIC METABOLIC PANEL
Anion gap: 11 (ref 5–15)
BUN: 30 mg/dL — ABNORMAL HIGH (ref 8–23)
CO2: 27 mmol/L (ref 22–32)
Calcium: 9 mg/dL (ref 8.9–10.3)
Chloride: 103 mmol/L (ref 98–111)
Creatinine, Ser: 1.48 mg/dL — ABNORMAL HIGH (ref 0.61–1.24)
GFR, Estimated: 53 mL/min — ABNORMAL LOW (ref >60)
Glucose, Bld: 101 mg/dL — ABNORMAL HIGH (ref 70–99)
Potassium: 4.1 mmol/L (ref 3.5–5.1)
Sodium: 141 mmol/L (ref 135–145)

## 2020-12-28 LAB — CBC WITH DIFFERENTIAL/PLATELET
Abs Immature Granulocytes: 0.02 10*3/uL (ref 0.00–0.07)
Basophils Absolute: 0 10*3/uL (ref 0.0–0.1)
Basophils Relative: 0 %
Eosinophils Absolute: 0.1 10*3/uL (ref 0.0–0.5)
Eosinophils Relative: 1 %
HCT: 39 % (ref 39.0–52.0)
Hemoglobin: 12.7 g/dL — ABNORMAL LOW (ref 13.0–17.0)
Immature Granulocytes: 0 %
Lymphocytes Relative: 17 %
Lymphs Abs: 1 10*3/uL (ref 0.7–4.0)
MCH: 27 pg (ref 26.0–34.0)
MCHC: 32.6 g/dL (ref 30.0–36.0)
MCV: 82.8 fL (ref 80.0–100.0)
Monocytes Absolute: 0.7 10*3/uL (ref 0.1–1.0)
Monocytes Relative: 12 %
Neutro Abs: 4 10*3/uL (ref 1.7–7.7)
Neutrophils Relative %: 70 %
Platelets: 266 10*3/uL (ref 150–400)
RBC: 4.71 MIL/uL (ref 4.22–5.81)
RDW: 16.3 % — ABNORMAL HIGH (ref 11.5–15.5)
WBC: 5.7 10*3/uL (ref 4.0–10.5)
nRBC: 0 % (ref 0.0–0.2)

## 2020-12-28 LAB — MAGNESIUM: Magnesium: 1.7 mg/dL (ref 1.7–2.4)

## 2020-12-28 NOTE — Progress Notes (Signed)
24 hour urine collection started.

## 2020-12-28 NOTE — Progress Notes (Signed)
Patient ID: Scott Greer, male   DOB: 1955-12-12, 65 y.o.   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  On presentation, BNP was 2579 with chest x-ray showing mild right basilar atelectasis.  He was started on IV Lasix.  Cardiology was consulted  Assessment & Plan:   Acute systolic heart failure -Echo showed EF of 25 to 30% with left ventricular global hypokinesis and severely decreased function -Strict input and output.  Daily weights.  Fluid restriction.  Negative balance of 2902 cc since admission. -Cardiology following and managing diuretics.  Currently on IV Lasix and Entresto.  Monitor creatinine -Still has significant edema and will require inpatient IV diuresis  Diabetes mellitus type 2 uncontrolled with hyperglycemia -A1c 8.4 on 12/14/2020.  Continue CBGs with SSI.  Metformin on hold  Essential hypertension Noncompliance -Nonadherent to medications.  Monitor blood pressure.  Intermittently on the higher side.  Continue Lasix and Entresto  Positive for microalbuminuria -24-hour urine collection ongoing  DVT prophylaxis: Lovenox Code Status: Full Family Communication: None Disposition Plan: Status is: Inpatient  Remains inpatient appropriate because:Inpatient level of care appropriate due to severity of illness   Dispo: The patient is from: Home              Anticipated d/c is to: Home              Anticipated d/c date is: > 3 days              Patient currently is not medically stable to d/c.   Difficult to place patient No  Consultants: Cardiology  Procedures: Echo  Antimicrobials:  None   Subjective: Patient seen and examined at bedside.  Denies any chest pain, fever or vomiting.  Still short of breath with exertion.  Objective: Vitals:   12/27/20 2018 12/27/20 2056 12/28/20 0236 12/28/20 0337  BP: (!) 152/106 (!) 142/106  (!) 133/110  Pulse: 100 98  93  Resp:  18  16  Temp: 98.4 F (36.9 C)   98 F (36.7 C)  TempSrc: Oral   Oral  SpO2: 100% 100%  100%  Weight:   115.3 kg   Height:        Intake/Output Summary (Last 24 hours) at 12/28/2020 0724 Last data filed at 12/28/2020 8546 Gross per 24 hour  Intake 123 ml  Output 1300 ml  Net -1177 ml   Filed Weights   12/26/20 1358 12/27/20 0726 12/28/20 0236  Weight: 114.3 kg 116.1 kg 115.3 kg    Examination:  General exam: No acute distress.  Currently on room air.  Looks chronically ill. Respiratory system: Decreased breath sounds at bases bilaterally with bibasilar crackles cardiovascular system: Currently rate controlled, S1-S2 heard  gastrointestinal system: Abdomen is nondistended, soft and nontender.  Bowel sounds are heard  extremities: Bilateral lower extremity 2-3+ pitting edema present; no clubbing Central nervous system: Awake and alert.  No focal neurological deficits.  Moves extremities Skin: No obvious ecchymosis/lesions Psychiatry: Affect is flat    Data Reviewed: I have personally reviewed following labs and imaging studies  CBC: Recent Labs  Lab 12/25/20 1906 12/28/20 0349  WBC 5.5 5.7  NEUTROABS  --  4.0  HGB 12.0* 12.7*  HCT 38.3* 39.0  MCV 85.3 82.8  PLT 302 266   Basic Metabolic Panel: Recent Labs  Lab 12/25/20 1906 12/26/20 0730 12/27/20 0500 12/28/20 0349  NA 135 139 138 141  K 4.8 4.5 4.1 4.1  CL 102 104 111 103  CO2 25 24 19* 27  GLUCOSE 238* 147* 137* 101*  BUN 27* 26* 25* 30*  CREATININE 1.71* 1.62* 1.22 1.48*  CALCIUM 8.8* 9.2 7.4* 9.0  MG  --   --   --  1.7   GFR: Estimated Creatinine Clearance: 70.9 mL/min (A) (by C-G formula based on SCr of 1.48  mg/dL (H)). Liver Function Tests: Recent Labs  Lab 12/25/20 0018  AST 25  ALT 30  ALKPHOS 179*  BILITOT 1.1  PROT 6.2*  ALBUMIN 3.1*   No results for input(s): LIPASE, AMYLASE in the last 168 hours. No results for input(s): AMMONIA in the last 168 hours. Coagulation Profile: No results for input(s): INR, PROTIME in the last 168 hours. Cardiac Enzymes: No results for input(s): CKTOTAL, CKMB, CKMBINDEX, TROPONINI in the last 168 hours. BNP (last 3 results) No results for input(s): PROBNP in the last 8760 hours. HbA1C: No results for input(s): HGBA1C in the last 72 hours. CBG: Recent Labs  Lab 12/27/20 0730 12/27/20 1205 12/27/20 1714 12/27/20 2119 12/28/20 0608  GLUCAP 152* 171* 172* 208* 115*   Lipid Profile: No results for input(s): CHOL, HDL, LDLCALC, TRIG, CHOLHDL, LDLDIRECT in the last 72 hours. Thyroid Function Tests: No results for input(s): TSH, T4TOTAL, FREET4, T3FREE, THYROIDAB in the last 72 hours. Anemia Panel: No results for input(s): VITAMINB12, FOLATE, FERRITIN, TIBC, IRON, RETICCTPCT in the last 72 hours. Sepsis Labs: No results for input(s): PROCALCITON, LATICACIDVEN in the last 168 hours.  Recent Results (from the past 240 hour(s))  SARS CORONAVIRUS 2 (TAT 6-24 HRS) Nasopharyngeal Nasopharyngeal Swab     Status: None   Collection Time: 12/26/20  2:09 AM   Specimen: Nasopharyngeal Swab  Result Value Ref Range Status   SARS Coronavirus 2 NEGATIVE NEGATIVE Final    Comment: (NOTE) SARS-CoV-2 target nucleic acids are NOT DETECTED.  The SARS-CoV-2 RNA is generally detectable in upper and lower respiratory specimens during the acute phase of infection. Negative results do not preclude SARS-CoV-2 infection, do not rule out co-infections with other pathogens, and should not be used as the sole basis for treatment or other patient management decisions. Negative results must be combined with clinical observations, patient history, and epidemiological  information. The expected result is Negative.  Fact Sheet for Patients: HairSlick.no  Fact Sheet for Healthcare Providers: quierodirigir.com  This test is not yet approved or cleared by the [place] FDA and  has been authorized for detection and/or diagnosis of SARS-CoV-2 by FDA under an Emergency Use Authorization (EUA). This EUA will remain  in effect (meaning this test can be used) for the duration of the COVID-19 declaration under Se ction 564(b)(1) of the Act, 21 U.S.C. section 360bbb-3(b)(1), unless the authorization is terminated or revoked sooner.  Performed at [facility], [address] [place], [place] [address]          Radiology Studies: ECHOCARDIOGRAM COMPLETE  Result Date: 12/26/2020    ECHOCARDIOGRAM REPORT   Patient Name:   KEADEN WITTER Date of Exam: 12/26/2020 Medical Rec #:  [id number]      Height:       75.0 in Accession #:    [id number]  Weight:       251.0 lb Date of Birth:  11-29-1955     BSA:          2.417 m Patient Age:    64 years       BP:           131/85 mmHg Patient Gender: M              HR:           95 bpm. Exam Location:  Inpatient Procedure: 2D Echo, Cardiac Doppler and Color Doppler Indications:    CHF-Acute Diastolic I50.31  History:        Patient has no prior history of Echocardiogram examinations.                 Risk Factors:Current Smoker, Hypertension, Diabetes and                 Dyslipidemia.  Sonographer:    Lavenia Atlas Referring Phys: [id number] JARED M GARDNER IMPRESSIONS  1. Left ventricular ejection fraction, by estimation, is 25 to 30%. The left ventricle has severely decreased function. The left ventricle demonstrates global hypokinesis. The left ventricular internal cavity size was mildly dilated. There is mild left ventricular hypertrophy. Left ventricular diastolic parameters are indeterminate.  2. Right ventricular systolic function is severely reduced. The right  ventricular size is mildly enlarged. There is mildly elevated pulmonary artery systolic pressure.  3. Right atrial size was mildly dilated.  4. The mitral valve is abnormal. Mild mitral valve regurgitation.  5. The aortic valve is grossly normal. Aortic valve regurgitation is not visualized. Mild to moderate aortic valve sclerosis/calcification is present, without any evidence of aortic stenosis.  6. The inferior vena cava is dilated in size with <50% respiratory variability, suggesting right atrial pressure of 15 mmHg. FINDINGS  Left Ventricle: Left ventricular ejection fraction, by estimation, is 25 to 30%. The left ventricle has severely decreased function. The left ventricle demonstrates global hypokinesis. The left ventricular internal cavity size was mildly dilated. There is mild left ventricular hypertrophy. Left ventricular diastolic parameters are indeterminate. Right Ventricle: The right ventricular size is mildly enlarged. Right vetricular wall thickness was not assessed. Right ventricular systolic function is severely reduced. There is mildly elevated pulmonary artery systolic pressure. The tricuspid regurgitant velocity is 2.59 m/s, and with an assumed right atrial pressure of 15 mmHg, the estimated right ventricular systolic pressure is 41.8 mmHg. Left Atrium: Left atrial size was normal in size. Right Atrium: Right atrial size was mildly dilated. Pericardium: There is no evidence of pericardial effusion. Mitral Valve: The mitral valve is abnormal. There is mild thickening of the mitral valve leaflet(s). Mild mitral valve regurgitation. Tricuspid Valve: The tricuspid valve is normal in structure. Tricuspid valve regurgitation is trivial. Aortic Valve: The aortic valve is grossly normal. Aortic valve regurgitation is not visualized. Mild to moderate aortic valve sclerosis/calcification is present, without any evidence of aortic stenosis. Pulmonic Valve: The pulmonic valve was not well visualized.  Pulmonic valve regurgitation is not visualized. Aorta: The aortic root is normal in size and structure. Venous: The inferior vena cava is dilated in size with less than 50% respiratory variability, suggesting right atrial pressure of 15 mmHg. IAS/Shunts: The interatrial septum was not assessed.  LEFT VENTRICLE PLAX 2D LVIDd:         5.40 cm      Diastology LVIDs:         4.70 cm      LV e'  medial:    3.81 cm/s LV PW:         1.40 cm      LV E/e' medial:  18.3 LV IVS:        1.30 cm      LV e' lateral:   7.40 cm/s LVOT diam:     2.50 cm      LV E/e' lateral: 9.4 LV SV:         56 LV SV Index:   23 LVOT Area:     4.91 cm  LV Volumes (MOD) LV vol d, MOD A4C: 190.0 ml LV vol s, MOD A4C: 115.0 ml LV SV MOD A4C:     190.0 ml RIGHT VENTRICLE RV Basal diam:  4.50 cm RV S prime:     5.66 cm/s TAPSE (M-mode): 2.2 cm LEFT ATRIUM             Index       RIGHT ATRIUM           Index LA diam:        4.60 cm 1.90 cm/m  RA Area:     19.80 cm LA Vol (A2C):   49.0 ml 20.27 ml/m RA Volume:   68.00 ml  28.14 ml/m LA Vol (A4C):   41.0 ml 16.96 ml/m LA Biplane Vol: 44.5 ml 18.41 ml/m  AORTIC VALVE LVOT Vmax:   58.10 cm/s LVOT Vmean:  38.600 cm/s LVOT VTI:    0.115 m  AORTA Ao Root diam: 3.00 cm MITRAL VALVE               TRICUSPID VALVE MV Area (PHT): 3.08 cm    TR Peak grad:   26.8 mmHg MV Decel Time: 246 msec    TR Vmax:        259.00 cm/s MV E velocity: 69.80 cm/s MV A velocity: 30.00 cm/s  SHUNTS MV E/A ratio:  2.33        Systemic VTI:  0.12 m                            Systemic Diam: 2.50 cm Dietrich Pates MD Electronically signed by Dietrich Pates MD Signature Date/Time: 12/26/2020/3:14:39 PM    Final         Scheduled Meds: . atorvastatin  10 mg Oral Daily  . enoxaparin (LOVENOX) injection  40 mg Subcutaneous Q24H  . furosemide  40 mg Intravenous BID  . insulin aspart  0-15 Units Subcutaneous TID WC  . insulin aspart  0-5 Units Subcutaneous QHS  . sacubitril-valsartan  1 tablet Oral BID  . sodium chloride flush  3 mL  Intravenous Q12H   Continuous Infusions: . sodium chloride            Glade Lloyd, MD [facility] 12/28/2020, 7:24 AM

## 2020-12-28 NOTE — Progress Notes (Signed)
Upon investigation, urine collected for longer than 24 hours, per notes the urine collection was started 2/21 at 1124 am.  Discussed with Dr. Toniann Fail. New order received for 24 urine collection. Will restart the urine collection.

## 2020-12-28 NOTE — Progress Notes (Addendum)
Progress Note  Patient Name: Scott Greer Date of Encounter: 12/28/2020  [facility] Cardiologist: Kristeen Miss, MD   Subjective   Breathing improving. No chest pain.   Inpatient Medications    Scheduled Meds: . atorvastatin  10 mg Oral Daily  . enoxaparin (LOVENOX) injection  40 mg Subcutaneous Q24H  . furosemide  40 mg Intravenous BID  . insulin aspart  0-15 Units Subcutaneous TID WC  . insulin aspart  0-5 Units Subcutaneous QHS  . sacubitril-valsartan  1 tablet Oral BID  . sodium chloride flush  3 mL Intravenous Q12H   Continuous Infusions: . sodium chloride     PRN Meds: sodium chloride, acetaminophen, ondansetron (ZOFRAN) IV, sodium chloride flush   Vital Signs    Vitals:   12/27/20 2056 12/28/20 0236 12/28/20 0337 12/28/20 0734  BP: (!) 142/106  (!) 133/110 (!) 138/98  Pulse: 98  93 90  Resp: 18  16 19   Temp:   98 F (36.7 C) 97.7 F (36.5 C)  TempSrc:   Oral Oral  SpO2: 100%  100% 100%  Weight:  115.3 kg    Height:        Intake/Output Summary (Last 24 hours) at 12/28/2020 0956 Last data filed at 12/28/2020 0905 Gross per 24 hour  Intake 361 ml  Output 1620 ml  Net -1259 ml   Last 3 Weights 12/28/2020 12/27/2020 12/26/2020  Weight (lbs) 254 lb 3.2 oz 256 lb 252 lb  Weight (kg) 115.304 kg 116.121 kg 114.306 kg      Telemetry    SR, PVCS rare- Personally Reviewed  ECG    N/A  Physical Exam   GEN: No acute distress.   Neck: No JVD Cardiac: RRR, no murmurs, rubs, or gallops.  Respiratory: Clear to auscultation bilaterally. GI: Soft, nontender, + distended  MS: 2+ edema; No deformity. , edema is improving  Neuro:  Nonfocal  Psych: Normal affect   Labs    High Sensitivity Troponin:   Recent Labs  Lab 12/25/20 1906 12/25/20 2251  TROPONINIHS 25* 28*      Chemistry Recent Labs  Lab 12/25/20 0018 12/25/20 1906 12/26/20 0730 12/27/20 0500 12/28/20 0349  NA  --    < > 139 138 141  K  --    < > 4.5 4.1 4.1  CL  --    < >  104 111 103  CO2  --    < > 24 19* 27  GLUCOSE  --    < > 147* 137* 101*  BUN  --    < > 26* 25* 30*  CREATININE  --    < > 1.62* 1.22 1.48*  CALCIUM  --    < > 9.2 7.4* 9.0  PROT 6.2*  --   --   --   --   ALBUMIN 3.1*  --   --   --   --   AST 25  --   --   --   --   ALT 30  --   --   --   --   ALKPHOS 179*  --   --   --   --   BILITOT 1.1  --   --   --   --   GFRNONAA  --    < > 48* >60 53*  ANIONGAP  --    < > 11 8 11    < > = values in this interval not displayed.     Hematology  Recent Labs  Lab 12/25/20 1906 12/28/20 0349  WBC 5.5 5.7  RBC 4.49 4.71  HGB 12.0* 12.7*  HCT 38.3* 39.0  MCV 85.3 82.8  MCH 26.7 27.0  MCHC 31.3 32.6  RDW 16.1* 16.3*  PLT 302 266    BNP Recent Labs  Lab 12/25/20 0019  BNP 2,579.0*     DDimer No results for input(s): DDIMER in the last 168 hours.   Radiology    ECHOCARDIOGRAM COMPLETE  Result Date: 12/26/2020    ECHOCARDIOGRAM REPORT   Patient Name:   [name] Date of Exam: 12/26/2020 Medical Rec #:  [id number]      Height:       75.0 in Accession #:    [id number]     Weight:       251.0 lb Date of Birth:  December 17, 1955     BSA:          2.417 m Patient Age:    64 years       BP:           131/85 mmHg Patient Gender: M              HR:           95 bpm. Exam Location:  Inpatient Procedure: 2D Echo, Cardiac Doppler and Color Doppler Indications:    CHF-Acute Diastolic I50.31  History:        Patient has no prior history of Echocardiogram examinations.                 Risk Factors:Current Smoker, Hypertension, Diabetes and                 Dyslipidemia.  Sonographer:    Lavenia Atlas Referring Phys: [phone] JARED M GARDNER IMPRESSIONS  1. Left ventricular ejection fraction, by estimation, is 25 to 30%. The left ventricle has severely decreased function. The left ventricle demonstrates global hypokinesis. The left ventricular internal cavity size was mildly dilated. There is mild left ventricular hypertrophy. Left ventricular diastolic  parameters are indeterminate.  2. Right ventricular systolic function is severely reduced. The right ventricular size is mildly enlarged. There is mildly elevated pulmonary artery systolic pressure.  3. Right atrial size was mildly dilated.  4. The mitral valve is abnormal. Mild mitral valve regurgitation.  5. The aortic valve is grossly normal. Aortic valve regurgitation is not visualized. Mild to moderate aortic valve sclerosis/calcification is present, without any evidence of aortic stenosis.  6. The inferior vena cava is dilated in size with <50% respiratory variability, suggesting right atrial pressure of 15 mmHg. FINDINGS  Left Ventricle: Left ventricular ejection fraction, by estimation, is 25 to 30%. The left ventricle has severely decreased function. The left ventricle demonstrates global hypokinesis. The left ventricular internal cavity size was mildly dilated. There is mild left ventricular hypertrophy. Left ventricular diastolic parameters are indeterminate. Right Ventricle: The right ventricular size is mildly enlarged. Right vetricular wall thickness was not assessed. Right ventricular systolic function is severely reduced. There is mildly elevated pulmonary artery systolic pressure. The tricuspid regurgitant velocity is 2.59 m/s, and with an assumed right atrial pressure of 15 mmHg, the estimated right ventricular systolic pressure is 41.8 mmHg. Left Atrium: Left atrial size was normal in size. Right Atrium: Right atrial size was mildly dilated. Pericardium: There is no evidence of pericardial effusion. Mitral Valve: The mitral valve is abnormal. There is mild thickening of the mitral valve leaflet(s). Mild mitral valve regurgitation. Tricuspid Valve: The tricuspid valve is  normal in structure. Tricuspid valve regurgitation is trivial. Aortic Valve: The aortic valve is grossly normal. Aortic valve regurgitation is not visualized. Mild to moderate aortic valve sclerosis/calcification is present,  without any evidence of aortic stenosis. Pulmonic Valve: The pulmonic valve was not well visualized. Pulmonic valve regurgitation is not visualized. Aorta: The aortic root is normal in size and structure. Venous: The inferior vena cava is dilated in size with less than 50% respiratory variability, suggesting right atrial pressure of 15 mmHg. IAS/Shunts: The interatrial septum was not assessed.  LEFT VENTRICLE PLAX 2D LVIDd:         5.40 cm      Diastology LVIDs:         4.70 cm      LV e' medial:    3.81 cm/s LV PW:         1.40 cm      LV E/e' medial:  18.3 LV IVS:        1.30 cm      LV e' lateral:   7.40 cm/s LVOT diam:     2.50 cm      LV E/e' lateral: 9.4 LV SV:         56 LV SV Index:   23 LVOT Area:     4.91 cm  LV Volumes (MOD) LV vol d, MOD A4C: 190.0 ml LV vol s, MOD A4C: 115.0 ml LV SV MOD A4C:     190.0 ml RIGHT VENTRICLE RV Basal diam:  4.50 cm RV S prime:     5.66 cm/s TAPSE (M-mode): 2.2 cm LEFT ATRIUM             Index       RIGHT ATRIUM           Index LA diam:        4.60 cm 1.90 cm/m  RA Area:     19.80 cm LA Vol (A2C):   49.0 ml 20.27 ml/m RA Volume:   68.00 ml  28.14 ml/m LA Vol (A4C):   41.0 ml 16.96 ml/m LA Biplane Vol: 44.5 ml 18.41 ml/m  AORTIC VALVE LVOT Vmax:   58.10 cm/s LVOT Vmean:  38.600 cm/s LVOT VTI:    0.115 m  AORTA Ao Root diam: 3.00 cm MITRAL VALVE               TRICUSPID VALVE MV Area (PHT): 3.08 cm    TR Peak grad:   26.8 mmHg MV Decel Time: 246 msec    TR Vmax:        259.00 cm/s MV E velocity: 69.80 cm/s MV A velocity: 30.00 cm/s  SHUNTS MV E/A ratio:  2.33        Systemic VTI:  0.12 m                            Systemic Diam: 2.50 cm Dietrich Pates MD Electronically signed by Dietrich Pates MD Signature Date/Time: 12/26/2020/3:14:39 PM    Final     Cardiac Studies   Echo 12/26/20 1. Left ventricular ejection fraction, by estimation, is 25 to 30%. The  left ventricle has severely decreased function. The left ventricle  demonstrates global hypokinesis. The left  ventricular internal cavity size  was mildly dilated. There is mild left  ventricular hypertrophy. Left ventricular diastolic parameters are  indeterminate.  2. Right ventricular systolic function is severely reduced. The right  ventricular size is mildly enlarged. There is  mildly elevated pulmonary  artery systolic pressure.  3. Right atrial size was mildly dilated.  4. The mitral valve is abnormal. Mild mitral valve regurgitation.  5. The aortic valve is grossly normal. Aortic valve regurgitation is not  visualized. Mild to moderate aortic valve sclerosis/calcification is  present, without any evidence of aortic stenosis.  6. The inferior vena cava is dilated in size with <50% respiratory  variability, suggesting right atrial pressure of 15 mmHg.   Patient Profile     65 y.o. male with hx of with a hx of type 2 diabetes mellitus, hypertension, osteomyelitis and tobacco abuse seen for CHF in setting of non compliance.   Assessment & Plan    1. Acute and chronic combined CHF - Echocardiogram with LVEF of 25 to 30%.  Unable to determine diastolic parameters.  There is mild left ventricular hypertrophy.  The right ventricular systolic function is also severely reduced.  There is mildly elevated pulmonary artery pressures. Estimated PA pressure of 42 mmHg. -  Likely NICM due to untreated HTN - Started Entresto last night after lisinopril washout. - Significant volume overload on exam - Diuresed 2.9L. weight down 256>>254lb.  - Continue Lasix 40mg  BID and Entresto 49/51mg  BID - Add BB once better volume status  2. Uncontrolled HTN - BP improving on current medications  3. DM - Per primary team   4. Tobacco abuse - recommended cessation    5. AKI - Follow closely with diuresis  For questions or updates, please contact [facility] Please consult [email or web address] for contact info under        Signed , PA  12/28/2020, 9:56 AM    Attending Note:   The  patient was seen and examined.  Agree with assessment and plan as noted above.  Changes made to the above note as needed.  Patient seen and independently examined with  12/30/2020, PA .   We discussed all aspects of the encounter. I agree with the assessment and plan as stated above.  1.   Acute on chronic combined CHF:    Has diuresed 3.0 liters so far this admission. Was started on entresto last night   2.  Hypertensive urgency:   BP is improving      I have spent a total of 40 minutes with patient reviewing hospital  notes , telemetry, EKGs, labs and examining patient as well as establishing an assessment and plan that was discussed with the patient. > 50% of time was spent in direct patient care.    Chelsea Aus, Vesta Mixer., MD, [facility] 12/28/2020, 11:30 AM [address],  [address] Office [phone] Pager [phone]

## 2020-12-28 NOTE — Progress Notes (Signed)
Heart Failure Stewardship Pharmacist Progress Note   PCP: [name], MD PCP-Cardiologist: Kristeen Miss, MD    HPI:  65 yo M with PMH of T2DM, HTN, osteomyelitis, and tobacco abuse. He presented to the ED on 12/25/20 with peripheral edema and DOE. He was admitted for new onset CHF. An ECHO was done on 12/26/20 and LVEF was 25-30% and RV was severely reduced.    Current HF Medications: Furosemide 40 mg IV IBD Entresto 49/51 mg BID  Prior to admission HF Medications: None - medication noncompliance  Pertinent Lab Values:  . Serum creatinine 1.48, BUN 30, Potassium 4.1, Sodium 141, BNP 2579.0, Magnesium 1.7   Vital Signs: . Weight: 254 lbs (admission weight: 252 lbs) . Blood pressure: 130/90s  . Heart rate: 90s   Medication Assistance / Insurance Benefits Check: Does the patient have prescription insurance?  Yes Type of insurance plan: [place] Managed Medicaid  Outpatient Pharmacy:  [facility] Is the patient willing to use [facility] TOC pharmacy at discharge? Yes Is the patient willing to transition their outpatient pharmacy to utilize a [facility] outpatient pharmacy?   Pending    Assessment: 1. Acute on chronic systolic CHF (EF 32-99%), likely NICM due to medication noncompliance. NYHA class II symptoms. - Continue furosemide 40 mg IV BID - Start beta blocker once euvolemic  - Continue Entresto 49/51 mg BID - Consider starting spironolactone and Farxiga following IV diuresis   Plan: 1) Medication changes recommended at this time: - Continue IV diuresis - GDMT optimization once more euvolemic  2) Patient assistance application(s): - None pending - Patient has Medicaid  3)  Education  - To be completed prior to discharge  Sharen Hones, PharmD, BCPS Heart Failure Engineer, building services Phone [phone]

## 2020-12-28 NOTE — Consult Note (Signed)
   [facility] Inpatient Consult   12/28/2020  Scott Greer 03-26-1956 [id number]   Health Plan: Managed Medicaid  Patient was discussed in via conference call for unit progression meeting. Inpatient TOC team made aware patient is Managed Medicaid. Chart reviewed.  Plan:  Will follow and make appropriate referrals for disposition needs with the Managed Medicaid team as appropriate.  For questions, please contact:  Charlesetta Shanks, RN BSN CCM [facility]  [phone] business mobile phone Toll free office [phone]  Fax number: [phone] [email or web address]

## 2020-12-28 NOTE — Progress Notes (Signed)
24 hour urine transported to the lab.

## 2020-12-28 NOTE — TOC Benefit Eligibility Note (Signed)
Transition of Care [facility]) Benefit Eligibility Note    Patient Details  Name: Scott Greer MRN: [id number] Date of Birth: [birth year]   Medication/Dose: Sherryll Burger 49-mg. - 51mg  big 30 day supply  Covered?: Yes  Tier:  (NoTier)  Prescription Coverage [facility]  Spoke with Person/Company/Phone Number:: Josherice B. W/[facility] [employer] PH# [phone]  Co-Pay: Zero Co-pay  Prior Approval: Yes (PH# [phone])  Deductible:  (no deductible)       [phone] Phone Number: 12/28/2020, 3:38 PM

## 2020-12-29 ENCOUNTER — Encounter (HOSPITAL_COMMUNITY): Payer: Self-pay | Admitting: Internal Medicine

## 2020-12-29 DIAGNOSIS — I5021 Acute systolic (congestive) heart failure: Secondary | ICD-10-CM

## 2020-12-29 DIAGNOSIS — E1121 Type 2 diabetes mellitus with diabetic nephropathy: Secondary | ICD-10-CM | POA: Diagnosis not present

## 2020-12-29 DIAGNOSIS — Z9114 Patient's other noncompliance with medication regimen: Secondary | ICD-10-CM | POA: Diagnosis not present

## 2020-12-29 DIAGNOSIS — I5043 Acute on chronic combined systolic (congestive) and diastolic (congestive) heart failure: Secondary | ICD-10-CM | POA: Diagnosis not present

## 2020-12-29 DIAGNOSIS — I10 Essential (primary) hypertension: Secondary | ICD-10-CM | POA: Diagnosis not present

## 2020-12-29 DIAGNOSIS — I509 Heart failure, unspecified: Secondary | ICD-10-CM | POA: Diagnosis not present

## 2020-12-29 DIAGNOSIS — R809 Proteinuria, unspecified: Secondary | ICD-10-CM | POA: Diagnosis not present

## 2020-12-29 DIAGNOSIS — N1831 Chronic kidney disease, stage 3a: Secondary | ICD-10-CM | POA: Diagnosis not present

## 2020-12-29 DIAGNOSIS — N179 Acute kidney failure, unspecified: Secondary | ICD-10-CM | POA: Diagnosis not present

## 2020-12-29 LAB — PROTEIN, URINE, 24 HOUR
Collection Interval-UPROT: 24 hours
Protein, 24H Urine: 2747 mg/d — ABNORMAL HIGH (ref 50–100)
Protein, Urine: 67 mg/dL
Urine Total Volume-UPROT: 4100 mL

## 2020-12-29 LAB — BASIC METABOLIC PANEL
Anion gap: 13 (ref 5–15)
BUN: 33 mg/dL — ABNORMAL HIGH (ref 8–23)
CO2: 24 mmol/L (ref 22–32)
Calcium: 8.5 mg/dL — ABNORMAL LOW (ref 8.9–10.3)
Chloride: 103 mmol/L (ref 98–111)
Creatinine, Ser: 1.5 mg/dL — ABNORMAL HIGH (ref 0.61–1.24)
GFR, Estimated: 52 mL/min — ABNORMAL LOW (ref >60)
Glucose, Bld: 215 mg/dL — ABNORMAL HIGH (ref 70–99)
Potassium: 4.3 mmol/L (ref 3.5–5.1)
Sodium: 140 mmol/L (ref 135–145)

## 2020-12-29 LAB — GLUCOSE, CAPILLARY
Glucose-Capillary: 188 mg/dL — ABNORMAL HIGH (ref 70–99)
Glucose-Capillary: 190 mg/dL — ABNORMAL HIGH (ref 70–99)
Glucose-Capillary: 164 mg/dL — ABNORMAL HIGH (ref 70–99)
Glucose-Capillary: 140 mg/dL — ABNORMAL HIGH (ref 70–99)

## 2020-12-29 LAB — MAGNESIUM: Magnesium: 1.7 mg/dL (ref 1.7–2.4)

## 2020-12-29 MED ORDER — MAGNESIUM SULFATE 2 GM/50ML IV SOLN
2.0000 g | Freq: Once | INTRAVENOUS | Status: AC
  Administered 2020-12-29: 2 g via INTRAVENOUS
  Filled 2020-12-29: qty 50

## 2020-12-29 NOTE — Progress Notes (Signed)
Heart Failure Stewardship Pharmacist Progress Note   PCP: [name], MD PCP-Cardiologist: Kristeen Miss, MD    HPI:  65 yo M with PMH of T2DM, HTN, osteomyelitis, and tobacco abuse. He presented to the ED on 12/25/20 with peripheral edema and DOE. He was admitted for new onset CHF. An ECHO was done on 12/26/20 and LVEF was 25-30% and RV was severely reduced.    Current HF Medications: Furosemide 40 mg IV IBD Entresto 49/51 mg BID  Prior to admission HF Medications: None - medication noncompliance  Pertinent Lab Values:  . Serum creatinine 1.50, BUN 33, Potassium 4.3, Sodium 140, BNP 2579.0, Magnesium 1.7  Vital Signs: . Weight: 248 lbs (admission weight: 252 lbs) . Blood pressure: 120/90s  . Heart rate: 90-100s   Medication Assistance / Insurance Benefits Check: Does the patient have prescription insurance?  Yes Type of insurance plan:  Managed Medicaid  Outpatient Pharmacy:  [facility] Is the patient willing to use [facility] TOC pharmacy at discharge? Yes Is the patient willing to transition their outpatient pharmacy to utilize a [facility] outpatient pharmacy?   Pending    Assessment: 1. Acute on chronic systolic CHF (EF 40-97%), likely NICM due to medication noncompliance. NYHA class II symptoms. Still with 2-3+ pitting edema. No JVD. - Continue furosemide 40 mg IV BID - Start beta blocker once euvolemic  - Continue Entresto 49/51 mg BID - Consider starting spironolactone and Farxiga following IV diuresis   Plan: 1) Medication changes recommended at this time: - Continue IV diuresis - GDMT optimization once more euvolemic  2) Patient assistance application(s): - None pending - Patient has Medicaid  3)  Education  - To be completed prior to discharge  Sharen Hones, PharmD, BCPS Heart Failure Engineer, building services Phone [phone]

## 2020-12-29 NOTE — Progress Notes (Signed)
  Mobility Specialist Criteria Algorithm Info.   Mobility Team HOB elevated: Activity: Ambulated in hall; Ambulated to bathroom; Dangled on edge of bed Range of motion: Active; All extremities Level of assistance: Independent Assistive device:  Minutes sitting in chair:  Minutes stood: 5 minutes Minutes ambulated: 5 minutes Distance ambulated (ft): 200 ft Mobility response: Tolerated well Bed Position: Semi-fowlers  Patient agreed to participate in mobility this morning. He's independent but reported some instability due to bilateral amputations on feet. Ambulated to bathroom and then in hallway 180 feet independently with steady gait and WBOS. C/o SOB but was saturating 99% on RA. Tolerated ambulation well without incident and is now sitting EOB with all needs met.   12/29/2020 4:22 PM

## 2020-12-29 NOTE — Progress Notes (Addendum)
Progress Note  Patient Name: Scott Greer Date of Encounter: 12/29/2020  [facility]Care Cardiologist: Kristeen Miss, MD   Subjective   Did notice some DOE when ambulating today. Notes swelling is improving and he has been urinating a lot. No complaints of chest pain or palpitations.  Inpatient Medications    Scheduled Meds: . atorvastatin  10 mg Oral Daily  . enoxaparin (LOVENOX) injection  40 mg Subcutaneous Q24H  . furosemide  40 mg Intravenous BID  . insulin aspart  0-15 Units Subcutaneous TID WC  . insulin aspart  0-5 Units Subcutaneous QHS  . sacubitril-valsartan  1 tablet Oral BID  . sodium chloride flush  3 mL Intravenous Q12H   Continuous Infusions: . sodium chloride     PRN Meds: sodium chloride, acetaminophen, ondansetron (ZOFRAN) IV, sodium chloride flush   Vital Signs    Vitals:   12/28/20 2054 12/29/20 0106 12/29/20 0500 12/29/20 1010  BP: (!) 130/99  103/71 121/87  Pulse: (!) 105  91 90  Resp: 16  18   Temp: (!) 97.4 F (36.3 C)  97.9 F (36.6 C) 98 F (36.7 C)  TempSrc: Oral  Oral Oral  SpO2: 100%  99% 100%  Weight:  112.6 kg    Height:        Intake/Output Summary (Last 24 hours) at 12/29/2020 1102 Last data filed at 12/29/2020 3220 Gross per 24 hour  Intake 1012 ml  Output 3690 ml  Net -2678 ml   Last 3 Weights 12/29/2020 12/28/2020 12/27/2020  Weight (lbs) 248 lb 3.2 oz 254 lb 3.2 oz 256 lb  Weight (kg) 112.583 kg 115.304 kg 116.121 kg      Telemetry    Sinus rhythm, rare PVC's - Personally Reviewed  ECG    No new tracings - Personally Reviewed  Physical Exam   GEN: No acute distress.   Neck: No JVD Cardiac: RRR, no murmurs, rubs, or gallops.  Respiratory: Clear to auscultation bilaterally. GI: Soft, nontender, mildly distended  MS: 2-3+ LE edema; No deformity. Neuro:  Nonfocal  Psych: Normal affect   Labs    High Sensitivity Troponin:   Recent Labs  Lab 12/25/20 1906 12/25/20 2251  TROPONINIHS 25* 28*       Chemistry Recent Labs  Lab 12/25/20 0018 12/25/20 1906 12/27/20 0500 12/28/20 0349 12/29/20 0315  NA  --    < > 138 141 140  K  --    < > 4.1 4.1 4.3  CL  --    < > 111 103 103  CO2  --    < > 19* 27 24  GLUCOSE  --    < > 137* 101* 215*  BUN  --    < > 25* 30* 33*  CREATININE  --    < > 1.22 1.48* 1.50*  CALCIUM  --    < > 7.4* 9.0 8.5*  PROT 6.2*  --   --   --   --   ALBUMIN 3.1*  --   --   --   --   AST 25  --   --   --   --   ALT 30  --   --   --   --   ALKPHOS 179*  --   --   --   --   BILITOT 1.1  --   --   --   --   GFRNONAA  --    < > >60 53* 52*  ANIONGAP  --    < > 8 11 13    < > = values in this interval not displayed.     Hematology Recent Labs  Lab 12/25/20 1906 12/28/20 0349  WBC 5.5 5.7  RBC 4.49 4.71  HGB 12.0* 12.7*  HCT 38.3* 39.0  MCV 85.3 82.8  MCH 26.7 27.0  MCHC 31.3 32.6  RDW 16.1* 16.3*  PLT 302 266    BNP Recent Labs  Lab 12/25/20 0019  BNP 2,579.0*     DDimer No results for input(s): DDIMER in the last 168 hours.   Radiology    No results found.  Cardiac Studies   Echo 12/26/20 1. Left ventricular ejection fraction, by estimation, is 25 to 30%. The  left ventricle has severely decreased function. The left ventricle  demonstrates global hypokinesis. The left ventricular internal cavity size  was mildly dilated. There is mild left  ventricular hypertrophy. Left ventricular diastolic parameters are  indeterminate.  2. Right ventricular systolic function is severely reduced. The right  ventricular size is mildly enlarged. There is mildly elevated pulmonary  artery systolic pressure.  3. Right atrial size was mildly dilated.  4. The mitral valve is abnormal. Mild mitral valve regurgitation.  5. The aortic valve is grossly normal. Aortic valve regurgitation is not  visualized. Mild to moderate aortic valve sclerosis/calcification is  present, without any evidence of aortic stenosis.  6. The inferior vena cava is  dilated in size with <50% respiratory  variability, suggesting right atrial pressure of 15 mmHg.   Patient Profile     65 y.o. male with a PMH of HTN, HLD, DM type 2, osteomyelitis s/p left great toe amputation 12/25/20, and tobacco abuse, who presented with LE edema/scrotal swelling, found to have acute systolic CHF for which cardiology is following  Assessment & Plan    1. Acute combined CHF/biventricular failure: patient presented with LE edema and scrotal swelling following recent amputation of left great toe. BNP elevated to 2579. CXR with basilar atelectasis. Echo showed EF 25-30%, indeterminate LV diastolic function, global hypokinesis, mild LVH, severely reduced RV function, mildly elevated PA pressures, mild RAE, and mild MR. He has been diuresing with IV lasix 40mg  BID with UOP net -3L in the past 24 hours and -5.7L this admission. Weight down to 248lbs today from peak 256lbs this admission. His lisinopril was discontinued and he was transitioned to entresto. Suspicions high for HTN mediated cardiomyopathy given issues with ,medication non-compliance.  - Continue IV lasix - anticipate another 2-3 days of IV diuresis. - Continue entresto - Anticipate addition of BBlocker in the coming days as volume status improves  - Continue to monitor strict I&Os and daily weights - Continue to monitor electrolytes closely and replete as needed to maintain K >4, Mg >2 - Anticipate repeat echo in 3 months - if LV function not improved despite management of his HTN, would consider an ischemic evaluation at that time.   2. HTN urgency: BP significantly improved with starting entresto. Anticipate starting BBlocker in the next 24-48 hours - Managed in the context of #1  3. DM type 2: poorly controlled with A1C 8.4 12/14/20 - Continue management per primary team - Would consider addition of SGLT2-inhibitor if Cr remains stable  4. HLD: LDL 47 12/14/20 - Continue atorvastatin  5. Tobacco abuse:  -  Continue to encourage cessation      For questions or updates, please contact [facility] Please consult [email or web address] for contact info under  Signed, Beatriz Stallion, PA-C  12/29/2020, 11:02 AM     Attending Note:   The patient was seen and examined.  Agree with assessment and plan as noted above.  Changes made to the above note as needed.  Patient seen and independently examined with  Judy Pimple, PA .   We discussed all aspects of the encounter. I agree with the assessment and plan as stated above.  1.  CHF   :  Cont meds.   Tolerating the entresto well On lasix, entresto. Anticipate starting coreg once he is better diuresed.  2.  HTN:  Improving     I have spent a total of 40 minutes with patient reviewing hospital  notes , telemetry, EKGs, labs and examining patient as well as establishing an assessment and plan that was discussed with the patient. > 50% of time was spent in direct patient care.    Vesta Mixer, Montez Hageman., MD, [facility] 12/29/2020, 12:08 PM [address],  [address] Office [phone] Pager [phone]

## 2020-12-29 NOTE — Progress Notes (Addendum)
Patient ID: Scott Greer, male   DOB: 1956/03/14, 65 y.o.   MRN: [id number]  PROGRESS NOTE    Scott Greer  JJO:[id number] DOB: [birth year] DOA: 12/25/2020 PCP: [name], MD   Brief Narrative:  65 year old male with history of diabetes mellitus type 2, nonadherent to medical therapy, hypertension, left great toe amputation due to osteomyelitis presented on 12/25/2020 with bilateral lower extremity swelling/edema to scrotum along with dyspnea on exertion.  On presentation, BNP was 2579 with chest x-ray showing mild right basilar atelectasis.  He was started on IV Lasix.  Cardiology was consulted  Assessment & Plan:   Acute on chronic combined systolic and diastolic heart failure -Echo showed EF of 25 to 30% with left ventricular global hypokinesis and severely decreased function -Strict input and output.  Daily weights.  Fluid restriction.  Negative balance of 5702 cc since admission. -Cardiology following and managing diuretics.  Currently on IV Lasix and Entresto.  Monitor creatinine -Still has significant edema and will require inpatient IV diuresis  Diabetes mellitus type 2 uncontrolled with hyperglycemia -A1c 8.4 on 12/14/2020.  Continue CBGs with SSI.  Metformin on hold.  Carb modified diet  AKI on CKD Stage IIIa -monitor kidney function  Essential hypertension Noncompliance -Nonadherent to medications.  Monitor blood pressure.   -Continue Lasix and Entresto  Positive for microalbuminuria -24-hour urine collection ongoing shows urine protein of 2747.  Spoke to Dr. Minerva Areola who will arrange for outpatient nephrology evaluation and follow-up.  DVT prophylaxis: Lovenox Code Status: Full Family Communication: None Disposition Plan: Status is: Inpatient  Remains inpatient appropriate because:Inpatient level of care appropriate due to severity of illness   Dispo: The patient is from: Home              Anticipated d/c is to: Home              Anticipated d/c  date is: 2 days              Patient currently is not medically stable to d/c.   Difficult to place patient No  Consultants: Cardiology/Spoke to Dr. Minerva Areola who will arrange for outpatient nephrology evaluation and follow-up.  Procedures: Echo  Antimicrobials: None   Subjective: Patient seen and examined at bedside.  Still feels swollen.  No overnight fever, abdominal pain, chest pain or vomiting reported.  Still complains of shortness of breath with exertion  Objective: Vitals:   12/28/20 1610 12/28/20 2054 12/29/20 0106 12/29/20 0500  BP: 134/85 (!) 130/99  103/71  Pulse: 94 (!) 105  91  Resp: 20 16  18   Temp: 98.3 F (36.8 C) (!) 97.4 F (36.3 C)  97.9 F (36.6 C)  TempSrc: Oral Oral  Oral  SpO2: 98% 100%  99%  Weight:   112.6 kg   Height:        Intake/Output Summary (Last 24 hours) at 12/29/2020 0738 Last data filed at 12/29/2020 0500 Gross per 24 hour  Intake 1010 ml  Output 3810 ml  Net -2800 ml   Filed Weights   12/27/20 0726 12/28/20 0236 12/29/20 0106  Weight: 116.1 kg 115.3 kg 112.6 kg    Examination:  General exam: No distress.  Chronically ill looking.   Respiratory system: Bilateral decreased breath sounds at bases with basilar crackles cardiovascular system: S1-S2 heard, intermittently tachycardic gastrointestinal system: Abdomen is slightly distended, soft and nontender.  Normal bowel sounds heard  extremities: Lower extremity edema 2-3+ present; no clubbing Central nervous system: Alert and oriented, no  focal neurological deficits.  Moving extremities  skin: No obvious lesions/petechiae  psychiatry: Flat affect   Data Reviewed: I have personally reviewed following labs and imaging studies  CBC: Recent Labs  Lab 12/25/20 1906 12/28/20 0349  WBC 5.5 5.7  NEUTROABS  --  4.0  HGB 12.0* 12.7*  HCT 38.3* 39.0  MCV 85.3 82.8  PLT 302 266   Basic Metabolic Panel: Recent Labs  Lab 12/25/20 1906 12/26/20 0730 12/27/20 0500  12/28/20 0349 12/29/20 0315  NA 135 139 138 141 140  K 4.8 4.5 4.1 4.1 4.3  CL 102 104 111 103 103  CO2 25 24 19* 27 24  GLUCOSE 238* 147* 137* 101* 215*  BUN 27* 26* 25* 30* 33*  CREATININE 1.71* 1.62* 1.22 1.48* 1.50*  CALCIUM 8.8* 9.2 7.4* 9.0 8.5*  MG  --   --   --  1.7 1.7   GFR: Estimated Creatinine Clearance: 69.1 mL/min (A) (by C-G formula based on SCr of 1.5 mg/dL (H)). Liver Function Tests: Recent Labs  Lab 12/25/20 0018  AST 25  ALT 30  ALKPHOS 179*  BILITOT 1.1  PROT 6.2*  ALBUMIN 3.1*   No results for input(s): LIPASE, AMYLASE in the last 168 hours. No results for input(s): AMMONIA in the last 168 hours. Coagulation Profile: No results for input(s): INR, PROTIME in the last 168 hours. Cardiac Enzymes: No results for input(s): CKTOTAL, CKMB, CKMBINDEX, TROPONINI in the last 168 hours. BNP (last 3 results) No results for input(s): PROBNP in the last 8760 hours. HbA1C: No results for input(s): HGBA1C in the last 72 hours. CBG: Recent Labs  Lab 12/28/20 0608 12/28/20 1144 12/28/20 1611 12/28/20 2121 12/29/20 0631  GLUCAP 115* 127* 207* 102* 188*   Lipid Profile: No results for input(s): CHOL, HDL, LDLCALC, TRIG, CHOLHDL, LDLDIRECT in the last 72 hours. Thyroid Function Tests: No results for input(s): TSH, T4TOTAL, FREET4, T3FREE, THYROIDAB in the last 72 hours. Anemia Panel: No results for input(s): VITAMINB12, FOLATE, FERRITIN, TIBC, IRON, RETICCTPCT in the last 72 hours. Sepsis Labs: No results for input(s): PROCALCITON, LATICACIDVEN in the last 168 hours.  Recent Results (from the past 240 hour(s))  SARS CORONAVIRUS 2 (TAT 6-24 HRS) Nasopharyngeal Nasopharyngeal Swab     Status: None   Collection Time: 12/26/20  2:09 AM   Specimen: Nasopharyngeal Swab  Result Value Ref Range Status   SARS Coronavirus 2 NEGATIVE NEGATIVE Final    Comment: (NOTE) SARS-CoV-2 target nucleic acids are NOT DETECTED.  The SARS-CoV-2 RNA is generally detectable in  upper and lower respiratory specimens during the acute phase of infection. Negative results do not preclude SARS-CoV-2 infection, do not rule out co-infections with other pathogens, and should not be used as the sole basis for treatment or other patient management decisions. Negative results must be combined with clinical observations, patient history, and epidemiological information. The expected result is Negative.  Fact Sheet for Patients: HairSlick.no  Fact Sheet for Healthcare Providers: quierodirigir.com  This test is not yet approved or cleared by the [place] FDA and  has been authorized for detection and/or diagnosis of SARS-CoV-2 by FDA under an Emergency Use Authorization (EUA). This EUA will remain  in effect (meaning this test can be used) for the duration of the COVID-19 declaration under Se ction 564(b)(1) of the Act, 21 U.S.C. section 360bbb-3(b)(1), unless the authorization is terminated or revoked sooner.  Performed at [facility], [address] [place], [place] [address]  Radiology Studies: No results found.      Scheduled Meds: . atorvastatin  10 mg Oral Daily  . enoxaparin (LOVENOX) injection  40 mg Subcutaneous Q24H  . furosemide  40 mg Intravenous BID  . insulin aspart  0-15 Units Subcutaneous TID WC  . insulin aspart  0-5 Units Subcutaneous QHS  . sacubitril-valsartan  1 tablet Oral BID  . sodium chloride flush  3 mL Intravenous Q12H   Continuous Infusions: . sodium chloride            Scott Lloyd, MD [facility] 12/29/2020, 7:38 AM

## 2020-12-29 NOTE — Progress Notes (Addendum)
Heart Failure Nurse Navigator Progress Note  PCP: [name], MD PCP-Cardiologist: Katherina Right MD Admission Diagnosis: new onset CHF Admitted from: home alone  Presentation:   Scott Greer presented with BLE to abd swelling.   ECHO/ LVEF: 25-30%, RV severely reduced.  Clinical Course:  Past Medical History:  Diagnosis Date  . Dehiscence of amputation stump (HCC)    left great toe  . Diabetes mellitus without complication (HCC) 1999   Type II  . Glaucoma 2015  . Hyperlipidemia   . Hypertension 2005  . Osteomyelitis (HCC)   . Wears glasses      Social History   Socioeconomic History  . Marital status: Legally Separated    Spouse name: [name]  . Number of children: 2  . Years of education: Not on file  . Highest education level: Associate degree: occupational, Scientist, product/process development, or vocational program  Occupational History  . Not on file  Tobacco Use  . Smoking status: Current Every Day Smoker    Types: Cigars, Cigarettes  . Smokeless tobacco: Former Neurosurgeon    Types: Chew  . Tobacco comment: 6 daily  Vaping Use  . Vaping Use: Never used  Substance and Sexual Activity  . Alcohol use: Yes    Alcohol/week: 11.0 standard drinks    Types: 3 Cans of beer, 8 Shots of liquor per week    Comment: occasional  . Drug use: Yes    Types: Marijuana    Comment: ocassional - last time 05/08/2020  . Sexual activity: Not Currently  Other Topics Concern  . Not on file  Social History Narrative   Out of prison for 2 months.   Lives at home with his wife.   Social Determinants of Health   Financial Resource Strain: High Risk  . Difficulty of Paying Living Expenses: Very hard  Food Insecurity: Food Insecurity Present  . Worried About Programme researcher, broadcasting/film/video in the Last Year: Sometimes true  . Ran Out of Food in the Last Year: Sometimes true  Transportation Needs: Unmet Transportation Needs  . Lack of Transportation (Medical): Yes  . Lack of Transportation (Non-Medical): Yes   Physical Activity: Inactive  . Days of Exercise per Week: 0 days  . Minutes of Exercise per Session: 0 min  Stress: Not on file  Social Connections: Not on file    High Risk Criteria for Readmission and/or Poor Patient Outcomes:  Heart failure hospital admissions (last 6 months): 1   No Show rate: 23%  Difficult social situation: yes  Demonstrates medication adherence: no  Primary Language: English  Literacy level: able to read/write. Concern for safe self-care.  Education Assessment and Provision:  Detailed education and instructions provided on heart failure disease management including the following:  Signs and symptoms of Heart Failure When to call the physician Importance of daily weights Low sodium diet Fluid restriction Medication management Anticipated future follow-up appointments  Patient education given on each of the above topics.  Patient acknowledges understanding via teach back method and acceptance of all instructions.  Education Materials:  "Living Better With Heart Failure" Booklet, HF zone tool, & Daily Weight Tracker Tool.  Patient has scale at home: no, given from AHF clinic. Patient has pill box at home: no, given from AHF clinic.   Barriers of Care:   -knowledge -financial -compliance  Considerations/Referrals:   Referral made to Heart Failure Pharmacist Stewardship: yes, appreciated Referral made to Heart & Vascular TOC clinic: yes, 3/1 @ 1pm  Items for Follow-up on  DC/TOC: -medication optimization -check/fill pill box -medication compliance/cost -SW: unable to pay rent and bills. Has vehicle, sometimes unable to afford gas. Has phone. On SSI ~ $840/mo.  Ozella Rocks, RN, BSN Heart Failure Nurse Navigator [phone]

## 2020-12-30 DIAGNOSIS — N1831 Chronic kidney disease, stage 3a: Secondary | ICD-10-CM | POA: Diagnosis not present

## 2020-12-30 DIAGNOSIS — I10 Essential (primary) hypertension: Secondary | ICD-10-CM | POA: Diagnosis not present

## 2020-12-30 DIAGNOSIS — I5043 Acute on chronic combined systolic (congestive) and diastolic (congestive) heart failure: Secondary | ICD-10-CM | POA: Diagnosis not present

## 2020-12-30 DIAGNOSIS — I5021 Acute systolic (congestive) heart failure: Secondary | ICD-10-CM | POA: Diagnosis not present

## 2020-12-30 DIAGNOSIS — N179 Acute kidney failure, unspecified: Secondary | ICD-10-CM | POA: Diagnosis not present

## 2020-12-30 DIAGNOSIS — Z9114 Patient's other noncompliance with medication regimen: Secondary | ICD-10-CM | POA: Diagnosis not present

## 2020-12-30 DIAGNOSIS — R809 Proteinuria, unspecified: Secondary | ICD-10-CM | POA: Diagnosis not present

## 2020-12-30 DIAGNOSIS — E1121 Type 2 diabetes mellitus with diabetic nephropathy: Secondary | ICD-10-CM | POA: Diagnosis not present

## 2020-12-30 DIAGNOSIS — I509 Heart failure, unspecified: Secondary | ICD-10-CM | POA: Diagnosis not present

## 2020-12-30 LAB — MAGNESIUM: Magnesium: 1.8 mg/dL (ref 1.7–2.4)

## 2020-12-30 LAB — BASIC METABOLIC PANEL
Anion gap: 9 (ref 5–15)
BUN: 30 mg/dL — ABNORMAL HIGH (ref 8–23)
CO2: 28 mmol/L (ref 22–32)
Calcium: 8.6 mg/dL — ABNORMAL LOW (ref 8.9–10.3)
Chloride: 104 mmol/L (ref 98–111)
Creatinine, Ser: 1.44 mg/dL — ABNORMAL HIGH (ref 0.61–1.24)
GFR, Estimated: 55 mL/min — ABNORMAL LOW (ref >60)
Glucose, Bld: 159 mg/dL — ABNORMAL HIGH (ref 70–99)
Potassium: 3.9 mmol/L (ref 3.5–5.1)
Sodium: 141 mmol/L (ref 135–145)

## 2020-12-30 LAB — GLUCOSE, CAPILLARY
Glucose-Capillary: 143 mg/dL — ABNORMAL HIGH (ref 70–99)
Glucose-Capillary: 168 mg/dL — ABNORMAL HIGH (ref 70–99)
Glucose-Capillary: 163 mg/dL — ABNORMAL HIGH (ref 70–99)
Glucose-Capillary: 183 mg/dL — ABNORMAL HIGH (ref 70–99)

## 2020-12-30 MED ORDER — POTASSIUM CHLORIDE CRYS ER 20 MEQ PO TBCR
20.0000 meq | EXTENDED_RELEASE_TABLET | Freq: Once | ORAL | Status: AC
  Administered 2020-12-30: 20 meq via ORAL
  Filled 2020-12-30: qty 1

## 2020-12-30 MED ORDER — MAGNESIUM SULFATE 2 GM/50ML IV SOLN
2.0000 g | Freq: Once | INTRAVENOUS | Status: AC
  Administered 2020-12-30: 2 g via INTRAVENOUS
  Filled 2020-12-30: qty 50

## 2020-12-30 MED ORDER — METOPROLOL SUCCINATE ER 25 MG PO TB24
25.0000 mg | ORAL_TABLET | Freq: Every day | ORAL | Status: DC
  Administered 2020-12-30 – 2021-01-01 (×3): 25 mg via ORAL
  Filled 2020-12-30 (×3): qty 1

## 2020-12-30 NOTE — TOC Progression Note (Signed)
Transition of Care (TOC) - Progression Note Heart Failure   Patient Details  Name: Scott Greer MRN: [id number] Date of Birth: 10-Jan-1956  Transition of Care [facility]) CM/SW Contact  Luisa Louk, LCSWA Phone Number: 12/30/2020, 2:44 PM  Clinical Narrative:    CSW and RN Navigator spoke with patient about the Heart Failure Clinic and bridging the gap between inpatient and outpatient care. CSW and RN Navigator reviewed patients SDOH and immediate needs and Mr. Vaeth reported he was concerned about his bills and if his utilities will be on when he is able to discharge from the hospital. Mr. Snelgrove reported that he has been working with a IT trainer at [employer], Ms. Mickle Mallory [phone]) for his rent and utilities but has been unable to get in touch with her. Mr. Beals had his water bill with him at the hospital which stated his account is delinquent. CSW tried to reach out to Ms. Donnell and DSS administration wouldn't allow CSW to speak with her and they reported they will reach out to Mr. Kimura within 24/48 hours. CSW was able to assist Mr. Deringer with his water bill through patient care funds.         Expected Discharge Plan and Services                                                 Social Determinants of Health (SDOH) Interventions Food Insecurity Interventions: Other (Comment) (Pt has food stamps) Financial Strain Interventions: Other (Comment) (SW will see at [facility] TOC appt) Housing Interventions: Other (Comment) (SW to see at [facility]) Transportation Interventions: Patient Refused,Other (Comment) (pt delcined [facility] transport at this time.) Alcohol Brief Interventions/Follow-up: Alcohol Education,Brief Advice  Readmission Risk Interventions Readmission Risk Prevention Plan 06/08/2019  Transportation Screening Complete  PCP or Specialist Appt within 5-7 Days Complete  Home Care Screening Complete  Medication Review (RN CM) Complete  Some recent data might be hidden    Dannell Gortney, MSW, LCSWA [phone] Heart Failure Social Worker

## 2020-12-30 NOTE — Progress Notes (Signed)
Heart Failure Stewardship Pharmacist Progress Note   PCP: [name], MD PCP-Cardiologist: Kristeen Miss, MD    HPI:  65 yo M with PMH of T2DM, HTN, osteomyelitis, and tobacco abuse. He presented to the ED on 12/25/20 with peripheral edema and DOE. He was admitted for new onset CHF. An ECHO was done on 12/26/20 and LVEF was 25-30% and RV was severely reduced.    Current HF Medications: Furosemide 40 mg IV BID Metoprolol XL 25 mg daily Entresto 49/51 mg BID  Prior to admission HF Medications: None - medication noncompliance  Pertinent Lab Values:  . Serum creatinine 1.44, BUN 30, Potassium 3.9, Sodium 141, BNP 2579.0, Magnesium 1.8  Vital Signs: . Weight: 244 lbs (admission weight: 252 lbs) . Blood pressure: 140/100s  . Heart rate: 90s   Medication Assistance / Insurance Benefits Check: Does the patient have prescription insurance?  Yes Type of insurance plan: [place] Managed Medicaid  Outpatient Pharmacy:  Prior to admission outpatient [facility] Is the patient willing to use [facility] TOC pharmacy at discharge? Yes Is the patient willing to transition their outpatient pharmacy to utilize a [facility] outpatient pharmacy?   Pending    Assessment: 1. Acute on chronic systolic CHF (EF 62-26%), likely NICM due to medication noncompliance. NYHA class II symptoms. Still with 2+ edema to thighs. No JVD. - Continue furosemide 40 mg IV BID - Agree with starting metoprolol XL 25 mg daily - Continue Entresto 49/51 mg BID - Consider starting spironolactone and Farxiga once renal function stabilizes   Plan: 1) Medication changes recommended at this time: - Continue IV diuresis - GDMT optimization once closer to euvolemia and renal function stabilizes   2) Patient assistance application(s): - None pending - Patient has Medicaid  3)  Education  - To be completed prior to discharge  Sharen Hones, PharmD, BCPS Heart Failure Stewardship Pharmacist Phone [phone]

## 2020-12-30 NOTE — Progress Notes (Signed)
Patient ID: Scott Greer, male   DOB: [birth year], 65 y.o.   MRN: [id number]  PROGRESS NOTE    Scott Greer  MBT:[id number] DOB: August 24, 1956 DOA: 12/25/2020 PCP: [name], MD   Brief Narrative:  65 year old male with history of diabetes mellitus type 2, nonadherent to medical therapy, hypertension, left great toe amputation due to osteomyelitis presented on 12/25/2020 with bilateral lower extremity swelling/edema to scrotum along with dyspnea on exertion.  On presentation, BNP was 2579 with chest x-ray showing mild right basilar atelectasis.  He was started on IV Lasix.  Cardiology was consulted  Assessment & Plan:   Acute on chronic combined systolic and diastolic heart failure -Echo showed EF of 25 to 30% with left ventricular global hypokinesis and severely decreased function -Strict input and output.  Daily weights.  Fluid restriction.  Negative balance of 7349 cc since admission. -Cardiology following and managing diuretics.  Currently on IV Lasix and Entresto.  Monitor creatinine -Still has significant edema and will require inpatient IV diuresis  Diabetes mellitus type 2 uncontrolled with hyperglycemia -A1c 8.4 on 12/14/2020.  Continue CBGs with SSI.  Metformin on hold.  Carb modified diet  AKI on CKD Stage IIIa -monitor kidney function.  Currently stable  Essential hypertension Noncompliance -Nonadherent to medications.  Monitor blood pressure.   -Continue Lasix and Entresto  Positive for microalbuminuria -24-hour urine collection ongoing shows urine protein of 2747.  Spoke to Dr. Minerva Areola on 12/29/2020 who will arrange for outpatient nephrology evaluation and follow-up.  DVT prophylaxis: Lovenox Code Status: Full Family Communication: None Disposition Plan: Status is: Inpatient  Remains inpatient appropriate because:Inpatient level of care appropriate due to severity of illness   Dispo: The patient is from: Home              Anticipated d/c is to:  Home              Anticipated d/c date is: 2 days              Patient currently is not medically stable to d/c.   Difficult to place patient No  Consultants: Cardiology/Spoke to Dr. Minerva Areola on 12/29/2020 who will arrange for outpatient nephrology evaluation and follow-up.  Procedures: Echo  Antimicrobials: None   Subjective: Patient seen and examined at bedside.  Feels slightly better but still short of breath with minimal exertion.  No overnight fever, vomiting, worsening chest pain or abdominal pain reported. Objective: Vitals:   12/29/20 1131 12/29/20 2000 12/30/20 0000 12/30/20 0350  BP: (!) 122/95 (!) 137/94 134/75 (!) 135/104  Pulse: (!) 44 97 85 95  Resp: 20 18 18 18   Temp: 97.9 F (36.6 C) 98.7 F (37.1 C) 98.2 F (36.8 C) 98.4 F (36.9 C)  TempSrc: Oral  Oral Oral  SpO2: 99% 100% 100% 100%  Weight:    110.7 kg  Height:        Intake/Output Summary (Last 24 hours) at 12/30/2020 0733 Last data filed at 12/30/2020 0350 Gross per 24 hour  Intake 1586 ml  Output 3233 ml  Net -1647 ml   Filed Weights   12/28/20 0236 12/29/20 0106 12/30/20 0350  Weight: 115.3 kg 112.6 kg 110.7 kg    Examination:  General exam: Looks chronically ill.  No acute distress.  Currently on room air. Respiratory system: Decreased breath sounds at bases bilaterally with bibasilar crackles cardiovascular system: Currently rate controlled, S1-S2 heard  gastrointestinal system: Abdomen is distended, soft and nontender.  Bowel sounds are heard  extremities: No cyanosis; bilateral lower extremity 2+ pitting edema present   Data Reviewed: I have personally reviewed following labs and imaging studies  CBC: Recent Labs  Lab 12/25/20 1906 12/28/20 0349  WBC 5.5 5.7  NEUTROABS  --  4.0  HGB 12.0* 12.7*  HCT 38.3* 39.0  MCV 85.3 82.8  PLT 302 266   Basic Metabolic Panel: Recent Labs  Lab 12/26/20 0730 12/27/20 0500 12/28/20 0349 12/29/20 0315 12/30/20 0306  NA 139 138 141  140 141  K 4.5 4.1 4.1 4.3 3.9  CL 104 111 103 103 104  CO2 24 19* 27 24 28   GLUCOSE 147* 137* 101* 215* 159*  BUN 26* 25* 30* 33* 30*  CREATININE 1.62* 1.22 1.48* 1.50* 1.44*  CALCIUM 9.2 7.4* 9.0 8.5* 8.6*  MG  --   --  1.7 1.7 1.8   GFR: Estimated Creatinine Clearance: 71.5 mL/min (A) (by C-G formula based on SCr of 1.44 mg/dL (H)). Liver Function Tests: Recent Labs  Lab 12/25/20 0018  AST 25  ALT 30  ALKPHOS 179*  BILITOT 1.1  PROT 6.2*  ALBUMIN 3.1*   No results for input(s): LIPASE, AMYLASE in the last 168 hours. No results for input(s): AMMONIA in the last 168 hours. Coagulation Profile: No results for input(s): INR, PROTIME in the last 168 hours. Cardiac Enzymes: No results for input(s): CKTOTAL, CKMB, CKMBINDEX, TROPONINI in the last 168 hours. BNP (last 3 results) No results for input(s): PROBNP in the last 8760 hours. HbA1C: No results for input(s): HGBA1C in the last 72 hours. CBG: Recent Labs  Lab 12/29/20 0631 12/29/20 1133 12/29/20 1538 12/29/20 2151 12/30/20 0609  GLUCAP 188* 190* 164* 140* 143*   Lipid Profile: No results for input(s): CHOL, HDL, LDLCALC, TRIG, CHOLHDL, LDLDIRECT in the last 72 hours. Thyroid Function Tests: No results for input(s): TSH, T4TOTAL, FREET4, T3FREE, THYROIDAB in the last 72 hours. Anemia Panel: No results for input(s): VITAMINB12, FOLATE, FERRITIN, TIBC, IRON, RETICCTPCT in the last 72 hours. Sepsis Labs: No results for input(s): PROCALCITON, LATICACIDVEN in the last 168 hours.  Recent Results (from the past 240 hour(s))  SARS CORONAVIRUS 2 (TAT 6-24 HRS) Nasopharyngeal Nasopharyngeal Swab     Status: None   Collection Time: 12/26/20  2:09 AM   Specimen: Nasopharyngeal Swab  Result Value Ref Range Status   SARS Coronavirus 2 NEGATIVE NEGATIVE Final    Comment: (NOTE) SARS-CoV-2 target nucleic acids are NOT DETECTED.  The SARS-CoV-2 RNA is generally detectable in upper and lower respiratory specimens during  the acute phase of infection. Negative results do not preclude SARS-CoV-2 infection, do not rule out co-infections with other pathogens, and should not be used as the sole basis for treatment or other patient management decisions. Negative results must be combined with clinical observations, patient history, and epidemiological information. The expected result is Negative.  Fact Sheet for Patients: 12/28/20  Fact Sheet for Healthcare Providers: HairSlick.no  This test is not yet approved or cleared by the quierodirigir.com FDA and  has been authorized for detection and/or diagnosis of SARS-CoV-2 by FDA under an Emergency Use Authorization (EUA). This EUA will remain  in effect (meaning this test can be used) for the duration of the COVID-19 declaration under Se ction 564(b)(1) of the Act, 21 U.S.C. section 360bbb-3(b)(1), unless the authorization is terminated or revoked sooner.  Performed at [facility], [address] [place], [place]          Radiology Studies: No results  found.      Scheduled Meds: . atorvastatin  10 mg Oral Daily  . enoxaparin (LOVENOX) injection  40 mg Subcutaneous Q24H  . furosemide  40 mg Intravenous BID  . insulin aspart  0-15 Units Subcutaneous TID WC  . insulin aspart  0-5 Units Subcutaneous QHS  . sacubitril-valsartan  1 tablet Oral BID  . sodium chloride flush  3 mL Intravenous Q12H   Continuous Infusions: . sodium chloride            Glade Lloyd, MD [facility] 12/30/2020, 7:33 AM

## 2020-12-30 NOTE — Progress Notes (Addendum)
Progress Note  Patient Name: Scott Greer Date of Encounter: 12/30/2020  [facility] Cardiologist: Kristeen Miss, MD   Subjective   Doing fine this morning. Had some mild SOB overnight. Has not ambulated today to assess DOE. LE edema continues to improve. He continues to be anxious to go home as he does not like to leave his house unattended for this long. No complaints of chest pain or palpitations.  Inpatient Medications    Scheduled Meds: . atorvastatin  10 mg Oral Daily  . enoxaparin (LOVENOX) injection  40 mg Subcutaneous Q24H  . furosemide  40 mg Intravenous BID  . insulin aspart  0-15 Units Subcutaneous TID WC  . insulin aspart  0-5 Units Subcutaneous QHS  . sacubitril-valsartan  1 tablet Oral BID  . sodium chloride flush  3 mL Intravenous Q12H   Continuous Infusions: . sodium chloride     PRN Meds: sodium chloride, acetaminophen, ondansetron (ZOFRAN) IV, sodium chloride flush   Vital Signs    Vitals:   12/29/20 1131 12/29/20 2000 12/30/20 0000 12/30/20 0350  BP: (!) 122/95 (!) 137/94 134/75 (!) 135/104  Pulse: (!) 44 97 85 95  Resp: 20 18 18 18   Temp: 97.9 F (36.6 C) 98.7 F (37.1 C) 98.2 F (36.8 C) 98.4 F (36.9 C)  TempSrc: Oral  Oral Oral  SpO2: 99% 100% 100% 100%  Weight:    110.7 kg  Height:        Intake/Output Summary (Last 24 hours) at 12/30/2020 0920 Last data filed at 12/30/2020 01/01/2021 Gross per 24 hour  Intake 1227 ml  Output 3233 ml  Net -2006 ml   Last 3 Weights 12/30/2020 12/29/2020 12/28/2020  Weight (lbs) 244 lb 1.6 oz 248 lb 3.2 oz 254 lb 3.2 oz  Weight (kg) 110.723 kg 112.583 kg 115.304 kg      Telemetry    Sinus rhythm with occasional PVCs and runs of bigeminy - Personally Reviewed  ECG    No new tracings - Personally Reviewed  Physical Exam   GEN: No acute distress.   Neck: No JVD Cardiac: RRR, no murmurs, rubs, or gallops.  Respiratory: Clear to auscultation bilaterally. GI: Soft, nontender, non-distended  MS: 2+  edema to thighs; No deformity. Neuro:  Nonfocal  Psych: Normal affect   Labs    High Sensitivity Troponin:   Recent Labs  Lab 12/25/20 1906 12/25/20 2251  TROPONINIHS 25* 28*      Chemistry Recent Labs  Lab 12/25/20 0018 12/25/20 1906 12/28/20 0349 12/29/20 0315 12/30/20 0306  NA  --    < > 141 140 141  K  --    < > 4.1 4.3 3.9  CL  --    < > 103 103 104  CO2  --    < > 27 24 28   GLUCOSE  --    < > 101* 215* 159*  BUN  --    < > 30* 33* 30*  CREATININE  --    < > 1.48* 1.50* 1.44*  CALCIUM  --    < > 9.0 8.5* 8.6*  PROT 6.2*  --   --   --   --   ALBUMIN 3.1*  --   --   --   --   AST 25  --   --   --   --   ALT 30  --   --   --   --   ALKPHOS 179*  --   --   --   --  BILITOT 1.1  --   --   --   --   GFRNONAA  --    < > 53* 52* 55*  ANIONGAP  --    < > 11 13 9    < > = values in this interval not displayed.     Hematology Recent Labs  Lab 12/25/20 1906 12/28/20 0349  WBC 5.5 5.7  RBC 4.49 4.71  HGB 12.0* 12.7*  HCT 38.3* 39.0  MCV 85.3 82.8  MCH 26.7 27.0  MCHC 31.3 32.6  RDW 16.1* 16.3*  PLT 302 266    BNP Recent Labs  Lab 12/25/20 0019  BNP 2,579.0*     DDimer No results for input(s): DDIMER in the last 168 hours.   Radiology    No results found.  Cardiac Studies   Echo 12/26/20 1. Left ventricular ejection fraction, by estimation, is 25 to 30%. The  left ventricle has severely decreased function. The left ventricle  demonstrates global hypokinesis. The left ventricular internal cavity size  was mildly dilated. There is mild left  ventricular hypertrophy. Left ventricular diastolic parameters are  indeterminate.  2. Right ventricular systolic function is severely reduced. The right  ventricular size is mildly enlarged. There is mildly elevated pulmonary  artery systolic pressure.  3. Right atrial size was mildly dilated.  4. The mitral valve is abnormal. Mild mitral valve regurgitation.  5. The aortic valve is grossly normal.  Aortic valve regurgitation is not  visualized. Mild to moderate aortic valve sclerosis/calcification is  present, without any evidence of aortic stenosis.  6. The inferior vena cava is dilated in size with <50% respiratory  variability, suggesting right atrial pressure of 15 mmHg.    Patient Profile     65 y.o. male with a PMH of HTN, HLD, DM type 2, osteomyelitis s/p left great toe amputation 12/25/20, and tobacco abuse, who presented with LE edema/scrotal swelling, found to have acute systolic CHF for which cardiology is following  Assessment & Plan    1. Acute combined CHF/biventricular failure: patient presented with LE edema and scrotal swelling following recent amputation of left great toe. BNP elevated to 2579. CXR with basilar atelectasis. Echo showed EF 25-30%, indeterminate LV diastolic function, global hypokinesis, mild LVH, severely reduced RV function, mildly elevated PA pressures, mild RAE, and mild MR. He has been diuresing with IV lasix 40mg  BID with UOP net -1.6L in the past 24 hours and -7.3L this admission. Weight down to 244lbs today from peak 256lbs this admission. Cr stable today. His lisinopril was discontinued and he was transitioned to entresto. Suspicions high for HTN mediated cardiomyopathy given issues with medication non-compliance.  - Continue IV lasix - Will attempt to place compression stockings - Continue entresto - Will start metoprolol succinate 25mg  daily today  - Continue to monitor strict I&Os and daily weights - Continue to monitor electrolytes closely and replete as needed to maintain K >4, Mg >2 - Anticipate repeat echo in 3 months - if LV function not improved despite management of his HTN, would consider an ischemic evaluation at that time.   2. HTN urgency: BP significantly improved with starting entresto. - Managed in the context of #1  3. DM type 2: poorly controlled with A1C 8.4 12/14/20 - Continue management per primary team - Would consider  addition of SGLT2-inhibitor if Cr remains stable  4. HLD: LDL 47 12/14/20 - Continue atorvastatin  5. CKD stage 3: Cr stable at 1.4 today.  - Continue to monitor closely  6. Tobacco abuse:  - Continue to encourage cessation     For questions or updates, please contact [facility] Please consult [email or web address] for contact info under        Signed, Beatriz Stallion, PA-C  12/30/2020, 9:20 AM     Attending Note:   The patient was seen and examined.  Agree with assessment and plan as noted above.  Changes made to the above note as needed.  Patient seen and independently examined with  Judy Pimple, PA .   We discussed all aspects of the encounter. I agree with the assessment and plan as stated above.  1.  Acute combined CHF:  Continue to diurese.   His leg edema is improving but he still has a significant amount of edema present . He has going to be see by a research nurse to enroll in the DAPA trial. This is a randomized, double-blind, placebo-controlled trial for hospitalized CHF patients. It's evaluating the use of dapagliflozin 10mg  vs controls. He has not had a cath or myoview yet Would prefer that we diurese him well prior to cath He has not had any chest pain  We may be able to continue to tune him up at home and cath him as OP   2. HTN:  Improving   3.   HLD cont atrova   DM :  Continue care per primary team     I have spent a total of 40 minutes with patient reviewing hospital  notes , telemetry, EKGs, labs and examining patient as well as establishing an assessment and plan that was discussed with the patient. > 50% of time was spent in direct patient care.    , Vesta Mixer., MD, [facility] 12/30/2020, 12:11 PM [address],  [address] Office [phone] Pager [phone]

## 2020-12-31 DIAGNOSIS — I5021 Acute systolic (congestive) heart failure: Secondary | ICD-10-CM | POA: Diagnosis not present

## 2020-12-31 DIAGNOSIS — N179 Acute kidney failure, unspecified: Secondary | ICD-10-CM

## 2020-12-31 DIAGNOSIS — I10 Essential (primary) hypertension: Secondary | ICD-10-CM | POA: Diagnosis not present

## 2020-12-31 DIAGNOSIS — Z9114 Patient's other noncompliance with medication regimen: Secondary | ICD-10-CM | POA: Diagnosis not present

## 2020-12-31 DIAGNOSIS — R809 Proteinuria, unspecified: Secondary | ICD-10-CM | POA: Diagnosis not present

## 2020-12-31 DIAGNOSIS — I509 Heart failure, unspecified: Secondary | ICD-10-CM | POA: Diagnosis not present

## 2020-12-31 DIAGNOSIS — E1121 Type 2 diabetes mellitus with diabetic nephropathy: Secondary | ICD-10-CM | POA: Diagnosis not present

## 2020-12-31 DIAGNOSIS — I5043 Acute on chronic combined systolic (congestive) and diastolic (congestive) heart failure: Secondary | ICD-10-CM | POA: Diagnosis not present

## 2020-12-31 DIAGNOSIS — N1831 Chronic kidney disease, stage 3a: Secondary | ICD-10-CM | POA: Diagnosis not present

## 2020-12-31 LAB — BASIC METABOLIC PANEL
Anion gap: 9 (ref 5–15)
BUN: 29 mg/dL — ABNORMAL HIGH (ref 8–23)
CO2: 27 mmol/L (ref 22–32)
Calcium: 8.7 mg/dL — ABNORMAL LOW (ref 8.9–10.3)
Chloride: 103 mmol/L (ref 98–111)
Creatinine, Ser: 1.45 mg/dL — ABNORMAL HIGH (ref 0.61–1.24)
GFR, Estimated: 54 mL/min — ABNORMAL LOW (ref >60)
Glucose, Bld: 161 mg/dL — ABNORMAL HIGH (ref 70–99)
Potassium: 4.1 mmol/L (ref 3.5–5.1)
Sodium: 139 mmol/L (ref 135–145)

## 2020-12-31 LAB — GLUCOSE, CAPILLARY
Glucose-Capillary: 194 mg/dL — ABNORMAL HIGH (ref 70–99)
Glucose-Capillary: 133 mg/dL — ABNORMAL HIGH (ref 70–99)
Glucose-Capillary: 230 mg/dL — ABNORMAL HIGH (ref 70–99)

## 2020-12-31 LAB — MAGNESIUM: Magnesium: 2 mg/dL (ref 1.7–2.4)

## 2020-12-31 MED ORDER — FUROSEMIDE 10 MG/ML IJ SOLN
100.0000 mg | Freq: Two times a day (BID) | INTRAVENOUS | Status: DC
  Administered 2020-12-31 (×2): 100 mg via INTRAVENOUS
  Filled 2020-12-31 (×4): qty 10

## 2020-12-31 MED ORDER — SPIRONOLACTONE 12.5 MG HALF TABLET
12.5000 mg | ORAL_TABLET | Freq: Every day | ORAL | Status: DC
  Administered 2020-12-31 – 2021-01-04 (×5): 12.5 mg via ORAL
  Filled 2020-12-31 (×5): qty 1

## 2020-12-31 MED ORDER — HEPARIN SODIUM (PORCINE) 5000 UNIT/ML IJ SOLN
5000.0000 [IU] | Freq: Three times a day (TID) | INTRAMUSCULAR | Status: DC
  Administered 2020-12-31 – 2021-01-02 (×7): 5000 [IU] via SUBCUTANEOUS
  Filled 2020-12-31 (×7): qty 1

## 2020-12-31 NOTE — Progress Notes (Signed)
Cardiology Progress Note  Patient ID: Scott Greer MRN: [id number] DOB: 1956-08-07 Date of Encounter: 12/31/2020  Primary Cardiologist: Kristeen Miss, MD  Subjective   Chief Complaint: Shortness of breath.  HPI: Remain shortness of breath.  Still grossly volume overloaded.  ROS:  All other ROS reviewed and negative. Pertinent positives noted in the HPI.     Inpatient Medications  Scheduled Meds: . atorvastatin  10 mg Oral Daily  . heparin  5,000 Units Subcutaneous Q8H  . insulin aspart  0-15 Units Subcutaneous TID WC  . insulin aspart  0-5 Units Subcutaneous QHS  . metoprolol succinate  25 mg Oral Daily  . sacubitril-valsartan  1 tablet Oral BID  . sodium chloride flush  3 mL Intravenous Q12H  . spironolactone  12.5 mg Oral Daily   Continuous Infusions: . sodium chloride    . furosemide     PRN Meds: sodium chloride, acetaminophen, ondansetron (ZOFRAN) IV, sodium chloride flush   Vital Signs   Vitals:   12/30/20 1215 12/30/20 1935 12/31/20 0353 12/31/20 0727  BP: (!) 142/90 (!) 147/106 140/89 134/90  Pulse:  (!) 101 (!) 47 88  Resp:  18 18 16   Temp:  98.6 F (37 C) 98.4 F (36.9 C) 97.6 F (36.4 C)  TempSrc:  Oral Oral Oral  SpO2:  100% 98% 100%  Weight:   109.4 kg   Height:        Intake/Output Summary (Last 24 hours) at 12/31/2020 0913 Last data filed at 12/31/2020 0827 Gross per 24 hour  Intake 1491 ml  Output 2250 ml  Net -759 ml   Last 3 Weights 12/31/2020 12/30/2020 12/29/2020  Weight (lbs) 241 lb 2.9 oz 244 lb 1.6 oz 248 lb 3.2 oz  Weight (kg) 109.4 kg 110.723 kg 112.583 kg      Telemetry  Overnight telemetry shows sinus rhythm in the 90s, which I personally reviewed.   ECG  The most recent ECG shows sinus rhythm, PVCs, nonspecific ST-T changes, which I personally reviewed.   Physical Exam   Vitals:   12/30/20 1215 12/30/20 1935 12/31/20 0353 12/31/20 0727  BP: (!) 142/90 (!) 147/106 140/89 134/90  Pulse:  (!) 101 (!) 47 88  Resp:  18 18  16   Temp:  98.6 F (37 C) 98.4 F (36.9 C) 97.6 F (36.4 C)  TempSrc:  Oral Oral Oral  SpO2:  100% 98% 100%  Weight:   109.4 kg   Height:         Intake/Output Summary (Last 24 hours) at 12/31/2020 0913 Last data filed at 12/31/2020 0827 Gross per 24 hour  Intake 1491 ml  Output 2250 ml  Net -759 ml    Last 3 Weights 12/31/2020 12/30/2020 12/29/2020  Weight (lbs) 241 lb 2.9 oz 244 lb 1.6 oz 248 lb 3.2 oz  Weight (kg) 109.4 kg 110.723 kg 112.583 kg    Body mass index is 30.15 kg/m.  General: Well nourished, well developed, in no acute distress Head: Atraumatic, normal size  Eyes: PEERLA, EOMI  Neck: Supple, JVD 15 to 17 cm of water Endocrine: No thryomegaly Cardiac: Normal S1, S2; RRR; no murmurs, rubs, or gallops Lungs: Crackles at the lung bases Abd: Soft, nontender, no hepatomegaly  Ext: 2+ pitting edema is up to the thighs Musculoskeletal: Status post great toe amputation l Skin: Warm and dry, no rashes   Neuro: Alert and oriented to person, place, time, and situation, CNII-XII grossly intact, no focal deficits  Psych: Normal mood and affect  Labs  High Sensitivity Troponin:   Recent Labs  Lab 12/25/20 1906 12/25/20 2251  TROPONINIHS 25* 28*     Cardiac EnzymesNo results for input(s): TROPONINI in the last 168 hours. No results for input(s): TROPIPOC in the last 168 hours.  Chemistry Recent Labs  Lab 12/25/20 0018 12/25/20 1906 12/29/20 0315 12/30/20 0306 12/31/20 0311  NA  --    < > 140 141 139  K  --    < > 4.3 3.9 4.1  CL  --    < > 103 104 103  CO2  --    < > 24 28 27   GLUCOSE  --    < > 215* 159* 161*  BUN  --    < > 33* 30* 29*  CREATININE  --    < > 1.50* 1.44* 1.45*  CALCIUM  --    < > 8.5* 8.6* 8.7*  PROT 6.2*  --   --   --   --   ALBUMIN 3.1*  --   --   --   --   AST 25  --   --   --   --   ALT 30  --   --   --   --   ALKPHOS 179*  --   --   --   --   BILITOT 1.1  --   --   --   --   GFRNONAA  --    < > 52* 55* 54*  ANIONGAP  --    < > 13  9 9    < > = values in this interval not displayed.    Hematology Recent Labs  Lab 12/25/20 1906 12/28/20 0349  WBC 5.5 5.7  RBC 4.49 4.71  HGB 12.0* 12.7*  HCT 38.3* 39.0  MCV 85.3 82.8  MCH 26.7 27.0  MCHC 31.3 32.6  RDW 16.1* 16.3*  PLT 302 266   BNP Recent Labs  Lab 12/25/20 0019  BNP 2,579.0*    DDimer No results for input(s): DDIMER in the last 168 hours.   Radiology  No results found.  Cardiac Studies  TTE 12/26/2020 1. Left ventricular ejection fraction, by estimation, is 25 to 30%. The  left ventricle has severely decreased function. The left ventricle  demonstrates global hypokinesis. The left ventricular internal cavity size  was mildly dilated. There is mild left  ventricular hypertrophy. Left ventricular diastolic parameters are  indeterminate.  2. Right ventricular systolic function is severely reduced. The right  ventricular size is mildly enlarged. There is mildly elevated pulmonary  artery systolic pressure.  3. Right atrial size was mildly dilated.  4. The mitral valve is abnormal. Mild mitral valve regurgitation.  5. The aortic valve is grossly normal. Aortic valve regurgitation is not  visualized. Mild to moderate aortic valve sclerosis/calcification is  present, without any evidence of aortic stenosis.  6. The inferior vena cava is dilated in size with <50% respiratory  variability, suggesting right atrial pressure of 15 mmHg.   Patient Profile  Scott Greer is a 65 y.o. male with type 2 diabetes, hypertension, hyperlipidemia, peripheral osteomyelitis with great toe amputation, tobacco abuse who was admitted on 12/25/2020 for new onset systolic heart failure.  Assessment & Plan   1.  New onset systolic heart failure, EF 25-30% -Longstanding history of diabetes.  This could be an ischemic cardiomyopathy.  No chest pain symptoms.  Also could be to uncontrolled hypertension. -Still grossly volume overloaded.  Increase Lasix to  100 mg IV  twice daily. -On metoprolol succinate 25 mg daily. -Started on Entresto 49-51 mg twice daily. -Add Aldactone 12.5 mg daily. -We will add SGLT2 inhibitor at some point. -Still has aggressive need for diuresis. -We will closely monitor his kidney function.  Maintain potassium and magnesium above 4 and 2 respectively. -I think he merits left heart catheterization this admission.  We will tentatively plan for this on Monday depending how he responds to aggressive diuresis.  For questions or updates, please contact [facility] Please consult [email or web address] for contact info under   Time Spent with Patient: I have spent a total of 25 minutes with patient reviewing hospital notes, telemetry, EKGs, labs and examining the patient as well as establishing an assessment and plan that was discussed with the patient.  > 50% of time was spent in direct patient care.    Signed, Lenna Gilford. Flora Lipps, MD [facility]  [facility]  12/31/2020 9:13 AM

## 2020-12-31 NOTE — Progress Notes (Signed)
Patient ID: Scott Greer, male   DOB: 1956/01/04, 65 y.o.   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  On presentation, BNP was 2579 with chest x-ray showing mild right basilar atelectasis.  He was started on IV Lasix.  Cardiology was consulted  Assessment & Plan:   Acute on chronic combined systolic and diastolic heart failure -Echo showed EF of 25 to 30% with left ventricular global hypokinesis and severely decreased function -Strict input and output.  Daily weights.  Fluid restriction.  Negative balance of 8473 cc since admission. -Cardiology following and managing diuretics.  Currently on IV Lasix, oral metoprolol and Entresto.  Monitor creatinine -Still has significant edema and will require inpatient IV diuresis  Diabetes mellitus type 2 uncontrolled with hyperglycemia -A1c 8.4 on 12/14/2020.  Continue CBGs with SSI.  Metformin on hold.  Carb modified diet  AKI on CKD Stage IIIa -monitor kidney function.  Currently stable  Essential hypertension Noncompliance -Nonadherent to medications.  Monitor blood pressure.   -Continue Lasix and Entresto  Positive for microalbuminuria -24-hour urine collection ongoing shows urine protein of 2747.  Spoke to Dr. Minerva Areola on 12/29/2020 who will arrange for outpatient nephrology evaluation and follow-up.  DVT prophylaxis: Lovenox Code Status: Full Family Communication: None Disposition Plan: Status is: Inpatient  Remains inpatient appropriate because:Inpatient level of care appropriate due to severity of illness   Dispo: The patient is from: Home               Anticipated d/c is to: Home              Anticipated d/c date is: 2 days              Patient currently is not medically stable to d/c.   Difficult to place patient No  Consultants: Cardiology/Spoke to Dr. Minerva Areola on 12/29/2020 who will arrange for outpatient nephrology evaluation and follow-up.  Procedures: Echo  Antimicrobials: None   Subjective: Patient seen and examined at bedside.  Denies chest pain, vomiting, fever or abdominal pain.  Still short of breath with exertion.   Objective: Vitals:   12/30/20 1215 12/30/20 1935 12/31/20 0353 12/31/20 0727  BP: (!) 142/90 (!) 147/106 140/89 134/90  Pulse:  (!) 101 (!) 47 88  Resp:  18 18 16   Temp:  98.6 F (37 C) 98.4 F (36.9 C) 97.6 F (36.4 C)  TempSrc:  Oral Oral Oral  SpO2:  100% 98% 100%  Weight:   109.4 kg   Height:        Intake/Output Summary (Last 24 hours) at 12/31/2020 0746 Last data filed at 12/31/2020 0332 Gross per 24 hour  Intake 1126 ml  Output 2250 ml  Net -1124 ml   Filed Weights   12/29/20 0106 12/30/20 0350 12/31/20 0353  Weight: 112.6 kg 110.7 kg 109.4 kg    Examination:  General exam: Looks chronically ill.  On room air currently.  No distress. Respiratory system: Bilateral decreased breath sounds at bases with scattered crackles cardiovascular system: Intermittently tachycardic and bradycardic; S1-S2 heard gastrointestinal system: Abdomen is still distended, soft and nontender.  Normal bowel sounds heard  extremities: Lower extremity edema present bilaterally; no  clubbing  Data Reviewed: I have personally reviewed following labs and imaging studies  CBC: Recent Labs  Lab 12/25/20 1906 12/28/20 0349  WBC 5.5 5.7  NEUTROABS  --  4.0  HGB 12.0* 12.7*  HCT 38.3* 39.0  MCV 85.3 82.8  PLT 302 266   Basic Metabolic Panel: Recent Labs  Lab 12/27/20 0500 12/28/20 0349 12/29/20 0315 12/30/20 0306 12/31/20 0311  NA 138 141 140 141 139  K 4.1 4.1 4.3 3.9 4.1  CL 111 103 103 104  103  CO2 19* 27 24 28 27   GLUCOSE 137* 101* 215* 159* 161*  BUN 25* 30* 33* 30* 29*  CREATININE 1.22 1.48* 1.50* 1.44* 1.45*  CALCIUM 7.4* 9.0 8.5* 8.6* 8.7*  MG  --  1.7 1.7 1.8 2.0   GFR: Estimated Creatinine Clearance: 70.6 mL/min (A) (by C-G formula based on SCr of 1.45 mg/dL (H)). Liver Function Tests: Recent Labs  Lab 12/25/20 0018  AST 25  ALT 30  ALKPHOS 179*  BILITOT 1.1  PROT 6.2*  ALBUMIN 3.1*   No results for input(s): LIPASE, AMYLASE in the last 168 hours. No results for input(s): AMMONIA in the last 168 hours. Coagulation Profile: No results for input(s): INR, PROTIME in the last 168 hours. Cardiac Enzymes: No results for input(s): CKTOTAL, CKMB, CKMBINDEX, TROPONINI in the last 168 hours. BNP (last 3 results) No results for input(s): PROBNP in the last 8760 hours. HbA1C: No results for input(s): HGBA1C in the last 72 hours. CBG: Recent Labs  Lab 12/29/20 2151 12/30/20 0609 12/30/20 1144 12/30/20 1553 12/30/20 2105  GLUCAP 140* 143* 168* 163* 183*   Lipid Profile: No results for input(s): CHOL, HDL, LDLCALC, TRIG, CHOLHDL, LDLDIRECT in the last 72 hours. Thyroid Function Tests: No results for input(s): TSH, T4TOTAL, FREET4, T3FREE, THYROIDAB in the last 72 hours. Anemia Panel: No results for input(s): VITAMINB12, FOLATE, FERRITIN, TIBC, IRON, RETICCTPCT in the last 72 hours. Sepsis Labs: No results for input(s): PROCALCITON, LATICACIDVEN in the last 168 hours.  Recent Results (from the past 240 hour(s))  SARS CORONAVIRUS 2 (TAT 6-24 HRS) Nasopharyngeal Nasopharyngeal Swab     Status: None   Collection Time: 12/26/20  2:09 AM   Specimen: Nasopharyngeal Swab  Result Value Ref Range Status   SARS Coronavirus 2 NEGATIVE NEGATIVE Final    Comment: (NOTE) SARS-CoV-2 target nucleic acids are NOT DETECTED.  The SARS-CoV-2 RNA is generally detectable in upper and lower respiratory specimens during the acute phase of infection. Negative results do not  preclude SARS-CoV-2 infection, do not rule out co-infections with other pathogens, and should not be used as the sole basis for treatment or other patient management decisions. Negative results must be combined with clinical observations, patient history, and epidemiological information. The expected result is Negative.  Fact Sheet for Patients: 12/28/20  Fact Sheet for Healthcare Providers: HairSlick.no  This test is not yet approved or cleared by the quierodirigir.com FDA and  has been authorized for detection and/or diagnosis of SARS-CoV-2 by FDA under an Emergency Use Authorization (EUA). This EUA will remain  in effect (meaning this test can be used) for the duration of the COVID-19 declaration under Se ction 564(b)(1) of the Act, 21 U.S.C. section 360bbb-3(b)(1), unless the authorization is terminated or revoked sooner.  Performed at [facility], [address] [place], [place]          Radiology Studies: No results found.      Scheduled Meds: . atorvastatin  10  mg Oral Daily  . enoxaparin (LOVENOX) injection  40 mg Subcutaneous Q24H  . furosemide  40 mg Intravenous BID  . insulin aspart  0-15 Units Subcutaneous TID WC  . insulin aspart  0-5 Units Subcutaneous QHS  . metoprolol succinate  25 mg Oral Daily  . sacubitril-valsartan  1 tablet Oral BID  . sodium chloride flush  3 mL Intravenous Q12H   Continuous Infusions: . sodium chloride            Scott Lloyd, MD [facility] 12/31/2020, 7:46 AM

## 2020-12-31 NOTE — Plan of Care (Signed)
?  Problem: Clinical Measurements: ?Goal: Respiratory complications will improve ?Outcome: Progressing ?  ?Problem: Coping: ?Goal: Level of anxiety will decrease ?Outcome: Progressing ?  ?Problem: Elimination: ?Goal: Will not experience complications related to bowel motility ?Outcome: Progressing ?  ?

## 2021-01-01 DIAGNOSIS — I10 Essential (primary) hypertension: Secondary | ICD-10-CM | POA: Diagnosis not present

## 2021-01-01 DIAGNOSIS — E1121 Type 2 diabetes mellitus with diabetic nephropathy: Secondary | ICD-10-CM | POA: Diagnosis not present

## 2021-01-01 DIAGNOSIS — I5021 Acute systolic (congestive) heart failure: Secondary | ICD-10-CM | POA: Diagnosis not present

## 2021-01-01 DIAGNOSIS — N1831 Chronic kidney disease, stage 3a: Secondary | ICD-10-CM | POA: Diagnosis not present

## 2021-01-01 DIAGNOSIS — I5043 Acute on chronic combined systolic (congestive) and diastolic (congestive) heart failure: Secondary | ICD-10-CM | POA: Diagnosis not present

## 2021-01-01 DIAGNOSIS — N179 Acute kidney failure, unspecified: Secondary | ICD-10-CM | POA: Diagnosis not present

## 2021-01-01 DIAGNOSIS — Z9114 Patient's other noncompliance with medication regimen: Secondary | ICD-10-CM | POA: Diagnosis not present

## 2021-01-01 DIAGNOSIS — I509 Heart failure, unspecified: Secondary | ICD-10-CM | POA: Diagnosis not present

## 2021-01-01 DIAGNOSIS — R809 Proteinuria, unspecified: Secondary | ICD-10-CM | POA: Diagnosis not present

## 2021-01-01 LAB — GLUCOSE, CAPILLARY
Glucose-Capillary: 134 mg/dL — ABNORMAL HIGH (ref 70–99)
Glucose-Capillary: 218 mg/dL — ABNORMAL HIGH (ref 70–99)
Glucose-Capillary: 189 mg/dL — ABNORMAL HIGH (ref 70–99)
Glucose-Capillary: 230 mg/dL — ABNORMAL HIGH (ref 70–99)

## 2021-01-01 LAB — BASIC METABOLIC PANEL
Anion gap: 9 (ref 5–15)
BUN: 28 mg/dL — ABNORMAL HIGH (ref 8–23)
CO2: 31 mmol/L (ref 22–32)
Calcium: 8.9 mg/dL (ref 8.9–10.3)
Chloride: 101 mmol/L (ref 98–111)
Creatinine, Ser: 1.51 mg/dL — ABNORMAL HIGH (ref 0.61–1.24)
GFR, Estimated: 52 mL/min — ABNORMAL LOW (ref >60)
Glucose, Bld: 160 mg/dL — ABNORMAL HIGH (ref 70–99)
Potassium: 3.8 mmol/L (ref 3.5–5.1)
Sodium: 141 mmol/L (ref 135–145)

## 2021-01-01 LAB — MAGNESIUM: Magnesium: 1.9 mg/dL (ref 1.7–2.4)

## 2021-01-01 MED ORDER — METOPROLOL SUCCINATE ER 50 MG PO TB24
75.0000 mg | ORAL_TABLET | Freq: Once | ORAL | Status: AC
  Administered 2021-01-01: 75 mg via ORAL
  Filled 2021-01-01: qty 1

## 2021-01-01 MED ORDER — FUROSEMIDE 10 MG/ML IJ SOLN
120.0000 mg | Freq: Once | INTRAVENOUS | Status: AC
  Administered 2021-01-01: 120 mg via INTRAVENOUS
  Filled 2021-01-01: qty 10

## 2021-01-01 MED ORDER — SODIUM CHLORIDE 0.9% FLUSH
3.0000 mL | Freq: Two times a day (BID) | INTRAVENOUS | Status: DC
  Administered 2021-01-01: 3 mL via INTRAVENOUS

## 2021-01-01 MED ORDER — METOPROLOL SUCCINATE ER 100 MG PO TB24
100.0000 mg | ORAL_TABLET | Freq: Every day | ORAL | Status: DC
  Administered 2021-01-02: 100 mg via ORAL
  Filled 2021-01-01: qty 1

## 2021-01-01 MED ORDER — SODIUM CHLORIDE 0.9% FLUSH: 3.0000 mL | INTRAVENOUS | Status: DC | PRN

## 2021-01-01 MED ORDER — SODIUM CHLORIDE 0.9 % IV SOLN: INTRAVENOUS | Status: DC

## 2021-01-01 MED ORDER — SODIUM CHLORIDE 0.9 % IV SOLN: 250.0000 mL | INTRAVENOUS | Status: DC | PRN

## 2021-01-01 MED ORDER — EMPAGLIFLOZIN 10 MG PO TABS
10.0000 mg | ORAL_TABLET | Freq: Every day | ORAL | Status: DC
  Administered 2021-01-01 – 2021-01-05 (×5): 10 mg via ORAL
  Filled 2021-01-01 (×5): qty 1

## 2021-01-01 MED ORDER — POTASSIUM CHLORIDE CRYS ER 20 MEQ PO TBCR
40.0000 meq | EXTENDED_RELEASE_TABLET | Freq: Once | ORAL | Status: AC
  Administered 2021-01-01: 40 meq via ORAL
  Filled 2021-01-01: qty 2

## 2021-01-01 MED ORDER — ASPIRIN 81 MG PO CHEW
81.0000 mg | CHEWABLE_TABLET | ORAL | Status: AC
Start: 2021-01-02 — End: 2021-01-02
  Administered 2021-01-02: 81 mg via ORAL
  Filled 2021-01-01: qty 1

## 2021-01-01 MED ORDER — SODIUM CHLORIDE 0.9% FLUSH: 10.0000 mL | INTRAVENOUS | Status: DC | PRN

## 2021-01-01 NOTE — Plan of Care (Signed)
  Problem: Activity: Goal: Risk for activity intolerance will decrease Outcome: Progressing   Problem: Elimination: Goal: Will not experience complications related to urinary retention Outcome: Progressing   Problem: Safety: Goal: Ability to remain free from injury will improve Outcome: Progressing   

## 2021-01-01 NOTE — Progress Notes (Signed)
Cardiology Progress Note  Patient ID: Scott Greer MRN: [id number] DOB: 1956-04-20 Date of Encounter: 01/01/2021  Primary Cardiologist: Kristeen Miss, MD  Subjective   Chief Complaint: Shortness of breath  HPI: Still with significant volume overload.  Good urine output.  Creatinine stable.  ROS:  All other ROS reviewed and negative. Pertinent positives noted in the HPI.     Inpatient Medications  Scheduled Meds: . atorvastatin  10 mg Oral Daily  . heparin  5,000 Units Subcutaneous Q8H  . insulin aspart  0-15 Units Subcutaneous TID WC  . insulin aspart  0-5 Units Subcutaneous QHS  . metoprolol succinate  25 mg Oral Daily  . sacubitril-valsartan  1 tablet Oral BID  . sodium chloride flush  3 mL Intravenous Q12H  . spironolactone  12.5 mg Oral Daily   Continuous Infusions: . sodium chloride    . furosemide     PRN Meds: sodium chloride, acetaminophen, ondansetron (ZOFRAN) IV, sodium chloride flush   Vital Signs   Vitals:   12/31/20 0353 12/31/20 0727 12/31/20 1956 01/01/21 0506  BP: 140/89 134/90 (!) 141/89 120/86  Pulse: (!) 47 88 64 91  Resp: 18 16 19 19   Temp: 98.4 F (36.9 C) 97.6 F (36.4 C) 97.9 F (36.6 C) 97.7 F (36.5 C)  TempSrc: Oral Oral  Oral  SpO2: 98% 100% 99% 100%  Weight: 109.4 kg   107.2 kg  Height:        Intake/Output Summary (Last 24 hours) at 01/01/2021 01/03/2021 Last data filed at 01/01/2021 0900 Gross per 24 hour  Intake 1200 ml  Output 2725 ml  Net -1525 ml   Last 3 Weights 01/01/2021 12/31/2020 12/30/2020  Weight (lbs) 236 lb 6.4 oz 241 lb 2.9 oz 244 lb 1.6 oz  Weight (kg) 107.23 kg 109.4 kg 110.723 kg      Telemetry  Overnight telemetry shows sinus rhythm with frequent PVCs and bigeminy, which I personally reviewed.   ECG  The most recent ECG shows sinus tachycardia heart rate 104, poor R wave progression, PVC, which I personally reviewed.   Physical Exam   Vitals:   12/31/20 0353 12/31/20 0727 12/31/20 1956 01/01/21 0506  BP:  140/89 134/90 (!) 141/89 120/86  Pulse: (!) 47 88 64 91  Resp: 18 16 19 19   Temp: 98.4 F (36.9 C) 97.6 F (36.4 C) 97.9 F (36.6 C) 97.7 F (36.5 C)  TempSrc: Oral Oral  Oral  SpO2: 98% 100% 99% 100%  Weight: 109.4 kg   107.2 kg  Height:         Intake/Output Summary (Last 24 hours) at 01/01/2021 Last data filed at 01/01/2021 0900 Gross per 24 hour  Intake 1200 ml  Output 2725 ml  Net -1525 ml    Last 3 Weights 01/01/2021 12/31/2020 12/30/2020  Weight (lbs) 236 lb 6.4 oz 241 lb 2.9 oz 244 lb 1.6 oz  Weight (kg) 107.23 kg 109.4 kg 110.723 kg    Body mass index is 29.55 kg/m.   General: Well nourished, well developed, in no acute distress Head: Atraumatic, normal size  Eyes: PEERLA, EOMI  Neck: Supple, JVD 15 to 17 cm of water Endocrine: No thryomegaly Cardiac: Normal S1, S2; RRR; no murmurs, rubs, or gallops Lungs: Crackles at the lung bases Abd: Soft, nontender, no hepatomegaly  Ext: 2+ pitting edema Musculoskeletal: Status post great toe amputations Skin: Warm and dry, no rashes   Neuro: Alert and oriented to person, place, time, and situation, CNII-XII grossly intact, no  focal deficits  Psych: Normal mood and affect   Labs  High Sensitivity Troponin:   Recent Labs  Lab 12/25/20 1906 12/25/20 2251  TROPONINIHS 25* 28*     Cardiac EnzymesNo results for input(s): TROPONINI in the last 168 hours. No results for input(s): TROPIPOC in the last 168 hours.  Chemistry Recent Labs  Lab 12/30/20 0306 12/31/20 0311 01/01/21 0419  NA 141 139 141  K 3.9 4.1 3.8  CL 104 103 101  CO2 28 27 31   GLUCOSE 159* 161* 160*  BUN 30* 29* 28*  CREATININE 1.44* 1.45* 1.51*  CALCIUM 8.6* 8.7* 8.9  GFRNONAA 55* 54* 52*  ANIONGAP 9 9 9     Hematology Recent Labs  Lab 12/25/20 1906 12/28/20 0349  WBC 5.5 5.7  RBC 4.49 4.71  HGB 12.0* 12.7*  HCT 38.3* 39.0  MCV 85.3 82.8  MCH 26.7 27.0  MCHC 31.3 32.6  RDW 16.1* 16.3*  PLT 302 266   BNPNo results for input(s): BNP,  PROBNP in the last 168 hours.  DDimer No results for input(s): DDIMER in the last 168 hours.   Radiology  No results found.  Cardiac Studies  TTE 12/26/2020 1. Left ventricular ejection fraction, by estimation, is 25 to 30%. The  left ventricle has severely decreased function. The left ventricle  demonstrates global hypokinesis. The left ventricular internal cavity size  was mildly dilated. There is mild left  ventricular hypertrophy. Left ventricular diastolic parameters are  indeterminate.  2. Right ventricular systolic function is severely reduced. The right  ventricular size is mildly enlarged. There is mildly elevated pulmonary  artery systolic pressure.  3. Right atrial size was mildly dilated.  4. The mitral valve is abnormal. Mild mitral valve regurgitation.  5. The aortic valve is grossly normal. Aortic valve regurgitation is not  visualized. Mild to moderate aortic valve sclerosis/calcification is  present, without any evidence of aortic stenosis.  6. The inferior vena cava is dilated in size with <50% respiratory  variability, suggesting right atrial pressure of 15 mmHg.   Patient Profile  Scott Greer is a 65 y.o. male with type 2 diabetes, hypertension, hyperlipidemia, peripheral osteomyelitis with great toe amputation, tobacco abuse who was admitted on 12/25/2020 for new onset systolic heart failure.  Assessment & Plan   1.  New onset systolic heart failure, EF 25-30% -Concerns for possible ischemic cardiomyopathy.  No chest pain.  Could also be related to uncontrolled hypertension. -Still grossly volume overloaded. -Increase Lasix 120 mg IV twice daily. -Increase metoprolol succinate 100 mg daily. -Continue Entresto 49-51 mg twice daily. -Continue Aldactone 12.5 mg daily. -Add Jardiance 10 mg daily -Tentatively plan for left and right heart cath tomorrow pending kidney function. -I suspect he will need 2-3 more days of aggressive IV diuresis.  2.   PVCs -Increase metoprolol succinate as above.  Shared Decision Making/Informed Consent The risks [stroke (1 in 1000), death (1 in 1000), kidney failure [usually temporary] (1 in 500), bleeding (1 in 200), allergic reaction [possibly serious] (1 in 200)], benefits (diagnostic support and management of coronary artery disease) and alternatives of a cardiac catheterization were discussed in detail with Scott Greer and he is willing to proceed.  For questions or updates, please contact [facility] Please consult [email or web address] for contact info under    Time Spent with Patient: I have spent a total of 25 minutes with patient reviewing hospital notes, telemetry, EKGs, labs and examining the patient as well as establishing an assessment and plan that  was discussed with the patient.  > 50% of time was spent in direct patient care.    Signed, Lenna Gilford. Flora Lipps, MD [facility]  [facility]  01/01/2021 9:37 AM

## 2021-01-01 NOTE — Progress Notes (Signed)
Patient ID: Scott Greer, male   DOB: 26-Oct-1956, 65 y.o.   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  On presentation, BNP was 2579 with chest x-ray showing mild right basilar atelectasis.  He was started on IV Lasix.  Cardiology was consulted  Assessment & Plan:   Acute on chronic combined systolic and diastolic heart failure -Echo showed EF of 25 to 30% with left ventricular global hypokinesis and severely decreased function -Strict input and output.  Daily weights.  Fluid restriction.  Negative balance of 8473 cc since admission. -Cardiology following and managing diuretics. Dose of IV Lasix was increased 200 mg twice a day on 12/31/2020. Spironolactone has also been added. Continue oral metoprolol and Entresto.  Monitor creatinine -Cardiology planning for cath probably tomorrow. Still has significant edema and will require inpatient IV diuresis  Diabetes mellitus type 2 uncontrolled with hyperglycemia -A1c 8.4 on 12/14/2020.  Continue CBGs with SSI.  Metformin on hold.  Carb modified diet  AKI on CKD Stage IIIa -monitor kidney function.  Currently stable  Essential hypertension Noncompliance -Nonadherent to medications.  Monitor blood pressure.   -Continue Lasix and Entresto  Positive for microalbuminuria -24-hour urine collection ongoing shows urine protein of 2747.  Spoke to Dr. Minerva Areola on 12/29/2020 who will arrange for outpatient nephrology evaluation and follow-up.  DVT prophylaxis: Lovenox Code Status: Full Family Communication: None Disposition Plan: Status is: Inpatient  Remains inpatient  appropriate because:Inpatient level of care appropriate due to severity of illness   Dispo: The patient is from: Home              Anticipated d/c is to: Home              Anticipated d/c date is: 2 days              Patient currently is not medically stable to d/c.   Difficult to place patient No  Consultants: Cardiology/Spoke to Dr. Minerva Areola on 12/29/2020 who will arrange for outpatient nephrology evaluation and follow-up.  Procedures: Echo  Antimicrobials: None   Subjective: Patient seen and examined at bedside. No overnight fever, vomiting, worsening shortness of breath or chest pain reported.  Objective: Vitals:   12/31/20 0353 12/31/20 0727 12/31/20 1956 01/01/21 0506  BP: 140/89 134/90 (!) 141/89 120/86  Pulse: (!) 47 88 64 91  Resp: 18 16 19 19   Temp: 98.4 F (36.9 C) 97.6 F (36.4 C) 97.9 F (36.6 C) 97.7 F (36.5 C)  TempSrc: Oral Oral  Oral  SpO2: 98% 100% 99% 100%  Weight: 109.4 kg   107.2 kg  Height:        Intake/Output Summary (Last 24 hours) at 01/01/2021 0747 Last data filed at 01/01/2021 0500 Gross per 24 hour  Intake 1443 ml  Output 2925 ml  Net -1482 ml   Filed Weights   12/30/20 0350 12/31/20 0353 01/01/21 0506  Weight: 110.7 kg 109.4 kg 107.2 kg    Examination:  General exam: On room air. Looks chronically ill. No acute distress.  Respiratory system: Decreased breath sounds at bases bilaterally with some crackles cardiovascular system: S1-S2 heard, rate controlled  gastrointestinal system: Abdomen is still distended, soft  and nontender. Bowel sounds are heard  extremities: No cyanosis; bilateral lower extremity edema present  data Reviewed: I have personally reviewed following labs and imaging studies  CBC: Recent Labs  Lab 12/25/20 1906 12/28/20 0349  WBC 5.5 5.7  NEUTROABS  --  4.0  HGB 12.0* 12.7*  HCT 38.3* 39.0  MCV 85.3 82.8  PLT 302 266   Basic Metabolic Panel: Recent Labs  Lab 12/28/20 0349 12/29/20 0315  12/30/20 0306 12/31/20 0311 01/01/21 0419  NA 141 140 141 139 141  K 4.1 4.3 3.9 4.1 3.8  CL 103 103 104 103 101  CO2 27 24 28 27 31   GLUCOSE 101* 215* 159* 161* 160*  BUN 30* 33* 30* 29* 28*  CREATININE 1.48* 1.50* 1.44* 1.45* 1.51*  CALCIUM 9.0 8.5* 8.6* 8.7* 8.9  MG 1.7 1.7 1.8 2.0 1.9   GFR: Estimated Creatinine Clearance: 67.2 mL/min (A) (by C-G formula based on SCr of 1.51 mg/dL (H)). Liver Function Tests: No results for input(s): AST, ALT, ALKPHOS, BILITOT, PROT, ALBUMIN in the last 168 hours. No results for input(s): LIPASE, AMYLASE in the last 168 hours. No results for input(s): AMMONIA in the last 168 hours. Coagulation Profile: No results for input(s): INR, PROTIME in the last 168 hours. Cardiac Enzymes: No results for input(s): CKTOTAL, CKMB, CKMBINDEX, TROPONINI in the last 168 hours. BNP (last 3 results) No results for input(s): PROBNP in the last 8760 hours. HbA1C: No results for input(s): HGBA1C in the last 72 hours. CBG: Recent Labs  Lab 12/30/20 2105 12/31/20 1146 12/31/20 1609 12/31/20 2116 01/01/21 0614  GLUCAP 183* 194* 133* 230* 134*   Lipid Profile: No results for input(s): CHOL, HDL, LDLCALC, TRIG, CHOLHDL, LDLDIRECT in the last 72 hours. Thyroid Function Tests: No results for input(s): TSH, T4TOTAL, FREET4, T3FREE, THYROIDAB in the last 72 hours. Anemia Panel: No results for input(s): VITAMINB12, FOLATE, FERRITIN, TIBC, IRON, RETICCTPCT in the last 72 hours. Sepsis Labs: No results for input(s): PROCALCITON, LATICACIDVEN in the last 168 hours.  Recent Results (from the past 240 hour(s))  SARS CORONAVIRUS 2 (TAT 6-24 HRS) Nasopharyngeal Nasopharyngeal Swab     Status: None   Collection Time: 12/26/20  2:09 AM   Specimen: Nasopharyngeal Swab  Result Value Ref Range Status   SARS Coronavirus 2 NEGATIVE NEGATIVE Final    Comment: (NOTE) SARS-CoV-2 target nucleic acids are NOT DETECTED.  The SARS-CoV-2 RNA is generally detectable in upper  and lower respiratory specimens during the acute phase of infection. Negative results do not preclude SARS-CoV-2 infection, do not rule out co-infections with other pathogens, and should not be used as the sole basis for treatment or other patient management decisions. Negative results must be combined with clinical observations, patient history, and epidemiological information. The expected result is Negative.  Fact Sheet for Patients: 12/28/20  Fact Sheet for Healthcare Providers: HairSlick.no  This test is not yet approved or cleared by the quierodirigir.com FDA and  has been authorized for detection and/or diagnosis of SARS-CoV-2 by FDA under an Emergency Use Authorization (EUA). This EUA will remain  in effect (meaning this test can be used) for the duration of the COVID-19 declaration under Se ction 564(b)(1) of the Act, 21 U.S.C. section 360bbb-3(b)(1), unless the authorization is terminated or revoked sooner.  Performed at [facility], [address], [place], [place]          Radiology Studies: No results found.      Scheduled Meds: . atorvastatin  10 mg Oral Daily  . heparin  5,000 Units Subcutaneous Q8H  . insulin aspart  0-15 Units Subcutaneous TID WC  . insulin aspart  0-5 Units Subcutaneous QHS  . metoprolol succinate  25 mg Oral Daily  . sacubitril-valsartan  1 tablet Oral BID  . sodium chloride flush  3 mL Intravenous Q12H  . spironolactone  12.5 mg Oral Daily   Continuous Infusions: . sodium chloride    . furosemide Stopped (12/31/20 1842)          Glade Lloyd, MD [facility] 01/01/2021, 7:47 AM

## 2021-01-02 ENCOUNTER — Inpatient Hospital Stay: Payer: Self-pay

## 2021-01-02 ENCOUNTER — Inpatient Hospital Stay (HOSPITAL_COMMUNITY)
Admission: EM | Disposition: A | Discharge status: Home / Self Care | Payer: Self-pay | Source: Home / Self Care | Attending: Internal Medicine

## 2021-01-02 ENCOUNTER — Encounter (HOSPITAL_COMMUNITY): Payer: Self-pay | Admitting: Cardiovascular Disease

## 2021-01-02 DIAGNOSIS — R809 Proteinuria, unspecified: Secondary | ICD-10-CM | POA: Diagnosis not present

## 2021-01-02 DIAGNOSIS — I2510 Atherosclerotic heart disease of native coronary artery without angina pectoris: Secondary | ICD-10-CM

## 2021-01-02 DIAGNOSIS — N179 Acute kidney failure, unspecified: Secondary | ICD-10-CM | POA: Diagnosis not present

## 2021-01-02 DIAGNOSIS — N1831 Chronic kidney disease, stage 3a: Secondary | ICD-10-CM | POA: Diagnosis not present

## 2021-01-02 DIAGNOSIS — Z9114 Patient's other noncompliance with medication regimen: Secondary | ICD-10-CM | POA: Diagnosis not present

## 2021-01-02 DIAGNOSIS — I10 Essential (primary) hypertension: Secondary | ICD-10-CM | POA: Diagnosis not present

## 2021-01-02 DIAGNOSIS — I509 Heart failure, unspecified: Secondary | ICD-10-CM | POA: Diagnosis not present

## 2021-01-02 DIAGNOSIS — I5043 Acute on chronic combined systolic (congestive) and diastolic (congestive) heart failure: Secondary | ICD-10-CM | POA: Diagnosis not present

## 2021-01-02 DIAGNOSIS — E1121 Type 2 diabetes mellitus with diabetic nephropathy: Secondary | ICD-10-CM | POA: Diagnosis not present

## 2021-01-02 DIAGNOSIS — I5021 Acute systolic (congestive) heart failure: Secondary | ICD-10-CM | POA: Diagnosis not present

## 2021-01-02 HISTORY — PX: RIGHT/LEFT HEART CATH AND CORONARY ANGIOGRAPHY: CATH118266

## 2021-01-02 LAB — POCT I-STAT EG7
Acid-Base Excess: 5 mmol/L — ABNORMAL HIGH (ref 0.0–2.0)
Acid-Base Excess: 6 mmol/L — ABNORMAL HIGH (ref 0.0–2.0)
Bicarbonate: 31.5 mmol/L — ABNORMAL HIGH (ref 20.0–28.0)
Bicarbonate: 32.3 mmol/L — ABNORMAL HIGH (ref 20.0–28.0)
Calcium, Ion: 1.23 mmol/L (ref 1.15–1.40)
Calcium, Ion: 1.24 mmol/L (ref 1.15–1.40)
HCT: 37 % — ABNORMAL LOW (ref 39.0–52.0)
HCT: 38 % — ABNORMAL LOW (ref 39.0–52.0)
Hemoglobin: 12.6 g/dL — ABNORMAL LOW (ref 13.0–17.0)
Hemoglobin: 12.9 g/dL — ABNORMAL LOW (ref 13.0–17.0)
O2 Saturation: 36 %
O2 Saturation: 40 %
Potassium: 4.3 mmol/L (ref 3.5–5.1)
Potassium: 4.3 mmol/L (ref 3.5–5.1)
Sodium: 140 mmol/L (ref 135–145)
Sodium: 141 mmol/L (ref 135–145)
TCO2: 33 mmol/L — ABNORMAL HIGH (ref 22–32)
TCO2: 34 mmol/L — ABNORMAL HIGH (ref 22–32)
pCO2, Ven: 52.3 mmHg (ref 44.0–60.0)
pCO2, Ven: 53.7 mmHg (ref 44.0–60.0)
pH, Ven: 7.388 (ref 7.250–7.430)
pH, Ven: 7.388 (ref 7.250–7.430)
pO2, Ven: 22 mmHg — CL (ref 32.0–45.0)
pO2, Ven: 24 mmHg — CL (ref 32.0–45.0)

## 2021-01-02 LAB — POCT I-STAT 7, (LYTES, BLD GAS, ICA,H+H)
Acid-Base Excess: 4 mmol/L — ABNORMAL HIGH (ref 0.0–2.0)
Bicarbonate: 26.3 mmol/L (ref 20.0–28.0)
Calcium, Ion: 1.15 mmol/L (ref 1.15–1.40)
HCT: 37 % — ABNORMAL LOW (ref 39.0–52.0)
Hemoglobin: 12.6 g/dL — ABNORMAL LOW (ref 13.0–17.0)
O2 Saturation: 98 %
Potassium: 4.2 mmol/L (ref 3.5–5.1)
Sodium: 140 mmol/L (ref 135–145)
TCO2: 27 mmol/L (ref 22–32)
pCO2 arterial: 30.6 mmHg — ABNORMAL LOW (ref 32.0–48.0)
pH, Arterial: 7.543 — ABNORMAL HIGH (ref 7.350–7.450)
pO2, Arterial: 91 mmHg (ref 83.0–108.0)

## 2021-01-02 LAB — BASIC METABOLIC PANEL
Anion gap: 11 (ref 5–15)
Anion gap: 11 (ref 5–15)
BUN: 32 mg/dL — ABNORMAL HIGH (ref 8–23)
BUN: 36 mg/dL — ABNORMAL HIGH (ref 8–23)
CO2: 27 mmol/L (ref 22–32)
CO2: 28 mmol/L (ref 22–32)
Calcium: 9 mg/dL (ref 8.9–10.3)
Calcium: 9 mg/dL (ref 8.9–10.3)
Chloride: 103 mmol/L (ref 98–111)
Chloride: 100 mmol/L (ref 98–111)
Creatinine, Ser: 1.65 mg/dL — ABNORMAL HIGH (ref 0.61–1.24)
Creatinine, Ser: 1.65 mg/dL — ABNORMAL HIGH (ref 0.61–1.24)
GFR, Estimated: 47 mL/min — ABNORMAL LOW (ref >60)
GFR, Estimated: 47 mL/min — ABNORMAL LOW (ref >60)
Glucose, Bld: 127 mg/dL — ABNORMAL HIGH (ref 70–99)
Glucose, Bld: 187 mg/dL — ABNORMAL HIGH (ref 70–99)
Potassium: 4.1 mmol/L (ref 3.5–5.1)
Potassium: 4.3 mmol/L (ref 3.5–5.1)
Sodium: 141 mmol/L (ref 135–145)
Sodium: 139 mmol/L (ref 135–145)

## 2021-01-02 LAB — GLUCOSE, CAPILLARY
Glucose-Capillary: 192 mg/dL — ABNORMAL HIGH (ref 70–99)
Glucose-Capillary: 126 mg/dL — ABNORMAL HIGH (ref 70–99)
Glucose-Capillary: 87 mg/dL (ref 70–99)
Glucose-Capillary: 155 mg/dL — ABNORMAL HIGH (ref 70–99)
Glucose-Capillary: 120 mg/dL — ABNORMAL HIGH (ref 70–99)

## 2021-01-02 LAB — MAGNESIUM: Magnesium: 1.9 mg/dL (ref 1.7–2.4)

## 2021-01-02 LAB — COOXEMETRY PANEL
Carboxyhemoglobin: 0.8 % (ref 0.5–1.5)
Carboxyhemoglobin: 0.9 % (ref 0.5–1.5)
Methemoglobin: 0.8 % (ref 0.0–1.5)
Methemoglobin: 0.8 % (ref 0.0–1.5)
O2 Saturation: 25.2 %
O2 Saturation: 38.5 %
Total hemoglobin: 12.2 g/dL (ref 12.0–16.0)
Total hemoglobin: 11.7 g/dL — ABNORMAL LOW (ref 12.0–16.0)

## 2021-01-02 SURGERY — RIGHT/LEFT HEART CATH AND CORONARY ANGIOGRAPHY
Anesthesia: LOCAL

## 2021-01-02 MED ORDER — LORAZEPAM 2 MG/ML IJ SOLN
1.0000 mg | Freq: Once | INTRAMUSCULAR | Status: AC
  Administered 2021-01-02: 1 mg via INTRAVENOUS
  Filled 2021-01-02: qty 1

## 2021-01-02 MED ORDER — MIDAZOLAM HCL 2 MG/2ML IJ SOLN
INTRAMUSCULAR | Status: DC | PRN
  Administered 2021-01-02: 1 mg via INTRAVENOUS

## 2021-01-02 MED ORDER — VERAPAMIL HCL 2.5 MG/ML IV SOLN
INTRAVENOUS | Status: AC
  Filled 2021-01-02: qty 2

## 2021-01-02 MED ORDER — SODIUM CHLORIDE 0.9% FLUSH
10.0000 mL | Freq: Two times a day (BID) | INTRAVENOUS | Status: DC
  Administered 2021-01-02 – 2021-01-04 (×5): 10 mL

## 2021-01-02 MED ORDER — VERAPAMIL HCL 2.5 MG/ML IV SOLN
INTRAVENOUS | Status: DC | PRN
  Administered 2021-01-02: 10 mL via INTRA_ARTERIAL

## 2021-01-02 MED ORDER — CHLORHEXIDINE GLUCONATE CLOTH 2 % EX PADS
6.0000 | MEDICATED_PAD | Freq: Every day | CUTANEOUS | Status: DC
  Administered 2021-01-02: 6 via TOPICAL

## 2021-01-02 MED ORDER — HEPARIN SODIUM (PORCINE) 1000 UNIT/ML IJ SOLN
INTRAMUSCULAR | Status: AC
  Filled 2021-01-02: qty 1

## 2021-01-02 MED ORDER — ASPIRIN 81 MG PO CHEW
81.0000 mg | CHEWABLE_TABLET | Freq: Every day | ORAL | Status: DC
  Administered 2021-01-03 – 2021-01-05 (×3): 81 mg via ORAL
  Filled 2021-01-02 (×3): qty 1

## 2021-01-02 MED ORDER — HEPARIN (PORCINE) IN NACL 1000-0.9 UT/500ML-% IV SOLN
INTRAVENOUS | Status: DC | PRN
  Administered 2021-01-02: 500 mL

## 2021-01-02 MED ORDER — MORPHINE SULFATE (PF) 2 MG/ML IV SOLN: 2.0000 mg | INTRAVENOUS | Status: DC | PRN

## 2021-01-02 MED ORDER — SACUBITRIL-VALSARTAN 24-26 MG PO TABS
1.0000 | ORAL_TABLET | Freq: Two times a day (BID) | ORAL | Status: DC
  Administered 2021-01-02 – 2021-01-04 (×6): 1 via ORAL
  Filled 2021-01-02 (×6): qty 1

## 2021-01-02 MED ORDER — LIDOCAINE HCL (PF) 1 % IJ SOLN
INTRAMUSCULAR | Status: AC
  Filled 2021-01-02: qty 30

## 2021-01-02 MED ORDER — MIDAZOLAM HCL 2 MG/2ML IJ SOLN
INTRAMUSCULAR | Status: AC
  Filled 2021-01-02: qty 2

## 2021-01-02 MED ORDER — HYDRALAZINE HCL 20 MG/ML IJ SOLN: 10.0000 mg | INTRAMUSCULAR | Status: AC | PRN

## 2021-01-02 MED ORDER — SODIUM CHLORIDE 0.9 % IV SOLN
250.0000 mL | INTRAVENOUS | Status: DC | PRN
Start: 2021-01-02 — End: 2021-01-05

## 2021-01-02 MED ORDER — FENTANYL CITRATE (PF) 100 MCG/2ML IJ SOLN
INTRAMUSCULAR | Status: DC | PRN
  Administered 2021-01-02: 25 ug via INTRAVENOUS

## 2021-01-02 MED ORDER — SODIUM CHLORIDE 0.9% FLUSH
3.0000 mL | INTRAVENOUS | Status: DC | PRN
  Administered 2021-01-02: 3 mL via INTRAVENOUS

## 2021-01-02 MED ORDER — SODIUM CHLORIDE 0.9% FLUSH
3.0000 mL | Freq: Two times a day (BID) | INTRAVENOUS | Status: DC
  Administered 2021-01-03 – 2021-01-05 (×3): 3 mL via INTRAVENOUS

## 2021-01-02 MED ORDER — NITROGLYCERIN 1 MG/10 ML FOR IR/CATH LAB
INTRA_ARTERIAL | Status: AC
  Filled 2021-01-02: qty 10

## 2021-01-02 MED ORDER — SODIUM CHLORIDE 0.9% FLUSH: 10.0000 mL | INTRAVENOUS | Status: DC | PRN

## 2021-01-02 MED ORDER — DIGOXIN 125 MCG PO TABS
0.1250 mg | ORAL_TABLET | Freq: Every day | ORAL | Status: DC
  Administered 2021-01-02 – 2021-01-05 (×4): 0.125 mg via ORAL
  Filled 2021-01-02 (×4): qty 1

## 2021-01-02 MED ORDER — FENTANYL CITRATE (PF) 100 MCG/2ML IJ SOLN
INTRAMUSCULAR | Status: AC
  Filled 2021-01-02: qty 2

## 2021-01-02 MED ORDER — MILRINONE LACTATE IN DEXTROSE 20-5 MG/100ML-% IV SOLN
0.2500 ug/kg/min | INTRAVENOUS | Status: DC
  Administered 2021-01-02 – 2021-01-04 (×4): 0.25 ug/kg/min via INTRAVENOUS
  Filled 2021-01-02 (×6): qty 100

## 2021-01-02 MED ORDER — SODIUM CHLORIDE 0.9 % IV SOLN: INTRAVENOUS | Status: AC

## 2021-01-02 MED ORDER — AMIODARONE HCL 200 MG PO TABS
200.0000 mg | ORAL_TABLET | Freq: Two times a day (BID) | ORAL | Status: DC
  Administered 2021-01-02 – 2021-01-04 (×6): 200 mg via ORAL
  Filled 2021-01-02 (×6): qty 1

## 2021-01-02 MED ORDER — ACETAMINOPHEN 325 MG PO TABS: 650.0000 mg | ORAL_TABLET | ORAL | Status: DC | PRN

## 2021-01-02 MED ORDER — LIDOCAINE HCL (PF) 1 % IJ SOLN
INTRAMUSCULAR | Status: DC | PRN
  Administered 2021-01-02: 1 mL
  Administered 2021-01-02: 2 mL

## 2021-01-02 MED ORDER — IOHEXOL 350 MG/ML SOLN
INTRAVENOUS | Status: DC | PRN
  Administered 2021-01-02: 40 mL

## 2021-01-02 MED ORDER — HEPARIN (PORCINE) IN NACL 1000-0.9 UT/500ML-% IV SOLN
INTRAVENOUS | Status: AC
  Filled 2021-01-02: qty 1000

## 2021-01-02 MED ORDER — LABETALOL HCL 5 MG/ML IV SOLN: 10.0000 mg | INTRAVENOUS | Status: AC | PRN

## 2021-01-02 MED ORDER — HEPARIN SODIUM (PORCINE) 1000 UNIT/ML IJ SOLN
INTRAMUSCULAR | Status: DC | PRN
  Administered 2021-01-02: 5000 [IU] via INTRAVENOUS

## 2021-01-02 MED ORDER — FUROSEMIDE 10 MG/ML IJ SOLN
12.0000 mg/h | INTRAVENOUS | Status: DC
  Administered 2021-01-02: 10 mg/h via INTRAVENOUS
  Administered 2021-01-03 – 2021-01-04 (×3): 12 mg/h via INTRAVENOUS
  Filled 2021-01-02 (×5): qty 20

## 2021-01-02 MED ORDER — ONDANSETRON HCL 4 MG/2ML IJ SOLN: 4.0000 mg | Freq: Four times a day (QID) | INTRAMUSCULAR | Status: DC | PRN

## 2021-01-02 SURGICAL SUPPLY — 16 items
BAG SNAP BAND KOVER 36X36 (MISCELLANEOUS) ×2 IMPLANT
CATH BALLN WEDGE 5F 110CM (CATHETERS) ×2 IMPLANT
CATH INFINITI 5FR ANG PIGTAIL (CATHETERS) ×2 IMPLANT
CATH OPTITORQUE TIG 4.0 5F (CATHETERS) ×2 IMPLANT
COVER DOME SNAP 22 D (MISCELLANEOUS) ×2 IMPLANT
DEVICE RAD COMP TR BAND LRG (VASCULAR PRODUCTS) ×2 IMPLANT
GLIDESHEATH SLEND A-KIT 6F 22G (SHEATH) ×2 IMPLANT
GUIDEWIRE .025 260CM (WIRE) ×2 IMPLANT
GUIDEWIRE INQWIRE 1.5J.035X260 (WIRE) ×1 IMPLANT
INQWIRE 1.5J .035X260CM (WIRE) ×2
KIT HEART LEFT (KITS) ×2 IMPLANT
PACK CARDIAC CATHETERIZATION (CUSTOM PROCEDURE TRAY) ×2 IMPLANT
SHEATH GLIDE SLENDER 4/5FR (SHEATH) ×2 IMPLANT
TRANSDUCER W/STOPCOCK (MISCELLANEOUS) ×2 IMPLANT
TUBING CIL FLEX 10 FLL-RA (TUBING) ×2 IMPLANT
WIRE HI TORQ VERSACORE-J 145CM (WIRE) ×2 IMPLANT

## 2021-01-02 NOTE — Progress Notes (Signed)
Heart Failure Navigator Progress Note  Reassessed for Heart & Vascular TOC clinic readiness. AHF consulted for eval of heart failure. Pt to start on milrinone IV.   Cancelled future HV TOC clinic visit.   Navigator available for reassessment of patient.   Ozella Rocks, RN, BSN Heart Failure Nurse Navigator [phone]

## 2021-01-02 NOTE — Progress Notes (Signed)
PT. Instructed to call staff for bathroom assistance while connected to IV infusion via PICC, with CVP monitoring. Marland Kitchen Pt. Did not call. Found in room with PICC disconnected and bleeding. IV RN on the floor to assess central line. Pt. Reconnected IV lines himself and reconnected. Pt. Informed of risk of infection with improper connection of PICC lines.

## 2021-01-02 NOTE — Progress Notes (Signed)
Cardiology paged to make aware of pt. Situation. Will notify [facility].

## 2021-01-02 NOTE — Progress Notes (Signed)
   PICC line placed.   CO-OX 25%. Started milrinone 0.25 mcg.   Repeat CO-OX now.   Demont Linford NP-C    3:17 PM .

## 2021-01-02 NOTE — Plan of Care (Signed)
  Problem: Elimination: Goal: Will not experience complications related to urinary retention Outcome: Progressing   Problem: Safety: Goal: Ability to remain free from injury will improve Outcome: Progressing   

## 2021-01-02 NOTE — Progress Notes (Signed)
Peripherally Inserted Central Catheter Placement  The IV Nurse has discussed with the patient and/or persons authorized to consent for the patient, the purpose of this procedure and the potential benefits and risks involved with this procedure.  The benefits include less needle sticks, lab draws from the catheter, and the patient may be discharged home with the catheter. Risks include, but not limited to, infection, bleeding, blood clot (thrombus formation), and puncture of an artery; nerve damage and irregular heartbeat and possibility to perform a PICC exchange if needed/ordered by physician.  Alternatives to this procedure were also discussed.  Bard Power PICC patient education guide, fact sheet on infection prevention and patient information card has been provided to patient /or left at bedside.    PICC Placement Documentation  PICC Double Lumen 01/02/21 PICC Left Brachial 46 cm 1 cm (Active)  Indication for Insertion or Continuance of Line Prolonged intravenous therapies 01/02/21 1259  Exposed Catheter (cm) 1 cm 01/02/21 1259  Site Assessment Clean;Dry;Intact 01/02/21 1259  Lumen #1 Status Blood return noted;Flushed;Saline locked 01/02/21 1259  Lumen #2 Status Blood return noted;Flushed;Saline locked 01/02/21 1259  Dressing Type Transparent;Securing device 01/02/21 1259  Dressing Status Clean;Dry;Intact 01/02/21 1259  Antimicrobial disc in place? Yes 01/02/21 1259  Safety Lock Not Applicable 01/02/21 1259  Dressing Change Due 01/09/21 01/02/21 1259       Romie Jumper 01/02/2021, 1:03 PM

## 2021-01-02 NOTE — Interval H&P Note (Signed)
Cath Lab Visit (complete for each Cath Lab visit)  Clinical Evaluation Leading to the Procedure:   ACS: No.  Non-ACS:    Anginal Classification: CCS I  Anti-ischemic medical therapy: No Therapy  Non-Invasive Test Results: No non-invasive testing performed  Prior CABG: No previous CABG      History and Physical Interval Note:  01/02/2021 10:16 AM  Scott Greer  has presented today for surgery, with the diagnosis of unstable angina.  The various methods of treatment have been discussed with the patient and family. After consideration of risks, benefits and other options for treatment, the patient has consented to  Procedure(s): RIGHT/LEFT HEART CATH AND CORONARY ANGIOGRAPHY (N/A) as a surgical intervention.  The patient's history has been reviewed, patient examined, no change in status, stable for surgery.  I have reviewed the patient's chart and labs.  Questions were answered to the patient's satisfaction.     [name]

## 2021-01-02 NOTE — Progress Notes (Signed)
Cardiology Progress Note  Patient ID: Scott Greer MRN: [id number] DOB: 05/24/1956 Date of Encounter: 01/02/2021  Primary Cardiologist: Philip [name], MD  Subjective   Chief Complaint: Shortness of breath  HPI: Good urine output.  Net -1.8 L overnight.  Net -11.8 L since admission.  Left and right heart catheterization today.  ROS:  All other ROS reviewed and negative. Pertinent positives noted in the HPI.     Inpatient Medications  Scheduled Meds: . atorvastatin  10 mg Oral Daily  . empagliflozin  10 mg Oral Daily  . heparin  5,000 Units Subcutaneous Q8H  . insulin aspart  0-15 Units Subcutaneous TID WC  . insulin aspart  0-5 Units Subcutaneous QHS  . metoprolol succinate  100 mg Oral Daily  . sacubitril-valsartan  1 tablet Oral BID  . sodium chloride flush  3 mL Intravenous Q12H  . sodium chloride flush  3 mL Intravenous Q12H  . spironolactone  12.5 mg Oral Daily   Continuous Infusions: . sodium chloride    . sodium chloride    . sodium chloride 10 mL/hr at 01/02/21 0602   PRN Meds: sodium chloride, sodium chloride, acetaminophen, ondansetron (ZOFRAN) IV, sodium chloride flush, sodium chloride flush, sodium chloride flush   Vital Signs   Vitals:   01/01/21 1559 01/01/21 2049 01/02/21 0326 01/02/21 0924  BP: 104/78 129/90 (!) 121/96 113/78  Pulse: 89 87 84 84  Resp: 18 16 20 18  Temp: 98.2 F (36.8 C) 99.2 F (37.3 C) 98.7 F (37.1 C) 98.4 F (36.9 C)  TempSrc: Oral Oral Oral Oral  SpO2: 98% 100% 100% 100%  Weight:   105.6 kg   Height:        Intake/Output Summary (Last 24 hours) at 01/02/2021 0934 Last data filed at 01/02/2021 0854 Gross per 24 hour  Intake 893 ml  Output 2450 ml  Net -1557 ml   Last 3 Weights 01/02/2021 01/01/2021 12/31/2020  Weight (lbs) 232 lb 14.4 oz 236 lb 6.4 oz 241 lb 2.9 oz  Weight (kg) 105.643 kg 107.23 kg 109.4 kg      Telemetry  Overnight telemetry shows sinus rhythm in the 70s with PVCs, which I personally reviewed.     Physical Exam   Vitals:   01/01/21 1559 01/01/21 2049 01/02/21 0326 01/02/21 0924  BP: 104/78 129/90 (!) 121/96 113/78  Pulse: 89 87 84 84  Resp: 18 16 20 18  Temp: 98.2 F (36.8 C) 99.2 F (37.3 C) 98.7 F (37.1 C) 98.4 F (36.9 C)  TempSrc: Oral Oral Oral Oral  SpO2: 98% 100% 100% 100%  Weight:   105.6 kg   Height:         Intake/Output Summary (Last 24 hours) at 01/02/2021 0934 Last data filed at 01/02/2021 0854 Gross per 24 hour  Intake 893 ml  Output 2450 ml  Net -1557 ml    Last 3 Weights 01/02/2021 01/01/2021 12/31/2020  Weight (lbs) 232 lb 14.4 oz 236 lb 6.4 oz 241 lb 2.9 oz  Weight (kg) 105.643 kg 107.23 kg 109.4 kg    Body mass index is 29.11 kg/m.  General: Well nourished, well developed, in no acute distress Head: Atraumatic, normal size  Eyes: PEERLA, EOMI  Neck: Supple, JVD 15 to 17 cm of water Endocrine: No thryomegaly Cardiac: Normal S1, S2; RRR; no murmurs, rubs, or gallops Lungs: Crackles at the lung bases Abd: Soft, nontender, no hepatomegaly  Ext: 2+ pitting edema up to the knees Musculoskeletal: No deformities, BUE and BLE   strength normal and equal Skin: Warm and dry, no rashes   Neuro: Alert and oriented to person, place, time, and situation, CNII-XII grossly intact, no focal deficits  Psych: Normal mood and affect   Labs  High Sensitivity Troponin:   Recent Labs  Lab 12/25/20 1906 12/25/20 2251  TROPONINIHS 25* 28*     Cardiac EnzymesNo results for input(s): TROPONINI in the last 168 hours. No results for input(s): TROPIPOC in the last 168 hours.  Chemistry Recent Labs  Lab 12/31/20 0311 01/01/21 0419 01/02/21 0222  NA 139 141 141  K 4.1 3.8 4.1  CL 103 101 103  CO2 27 31 27   GLUCOSE 161* 160* 127*  BUN 29* 28* 32*  CREATININE 1.45* 1.51* 1.65*  CALCIUM 8.7* 8.9 9.0  GFRNONAA 54* 52* 47*  ANIONGAP 9 9 11     Hematology Recent Labs  Lab 12/28/20 0349  WBC 5.7  RBC 4.71  HGB 12.7*  HCT 39.0  MCV 82.8  MCH 27.0  MCHC  32.6  RDW 16.3*  PLT 266   BNPNo results for input(s): BNP, PROBNP in the last 168 hours.  DDimer No results for input(s): DDIMER in the last 168 hours.   Radiology  No results found.  Cardiac Studies  TTE 12/26/2020 1. Left ventricular ejection fraction, by estimation, is 25 to 30%. The  left ventricle has severely decreased function. The left ventricle  demonstrates global hypokinesis. The left ventricular internal cavity size  was mildly dilated. There is mild left  ventricular hypertrophy. Left ventricular diastolic parameters are  indeterminate.  2. Right ventricular systolic function is severely reduced. The right  ventricular size is mildly enlarged. There is mildly elevated pulmonary  artery systolic pressure.  3. Right atrial size was mildly dilated.  4. The mitral valve is abnormal. Mild mitral valve regurgitation.  5. The aortic valve is grossly normal. Aortic valve regurgitation is not  visualized. Mild to moderate aortic valve sclerosis/calcification is  present, without any evidence of aortic stenosis.  6. The inferior vena cava is dilated in size with <50% respiratory  variability, suggesting right atrial pressure of 15 mmHg.   Patient Profile  Scott Greer a [birth year]63 y.o.malewith type 2 diabetes, hypertension, hyperlipidemia, peripheral osteomyelitis with great toe amputation, tobacco abuse who was admitted on 12/25/2020 for new onset systolic heart failure.  Assessment & Plan   1.  New onset systolic heart failure, EF 25 to 30% -Still volume overloaded.  He only received 1 dose of Lasix yesterday.  We will resume Lasix after his heart cath. -He has several more days of IV diuresis. -Continue metoprolol succinate 100 mg daily.  On Entresto 49-51 twice daily.  He is on Aldactone 12.5 mg daily.  We started Jardiance 10 mg daily as well. -Heart cath today to determine etiology.  Suspect this could be ischemic.  2.  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  On  beta-blocker.  Shared Decision Making/Informed Consent The risks [stroke (1 in 1000), death (1 in 1000), kidney failure [usually temporary] (1 in 500), bleeding (1 in 200), allergic reaction [possibly serious] (1 in 200)], benefits (diagnostic support and management of coronary artery disease) and alternatives of a cardiac catheterization were discussed in detail with Mr. Langham and he is willing to proceed.  For questions or updates, please contact [facility] Please consult [email or web address] for contact info under   Time Spent with Patient: I have spent a total of 25 minutes with patient reviewing hospital notes, telemetry, EKGs, labs and examining the  patient as well as establishing an assessment and plan that was discussed with the patient.  > 50% of time was spent in direct patient care.    Signed, Lenna Gilford. Flora Lipps, MD [facility]  [facility]  01/02/2021 9:34 AM

## 2021-01-02 NOTE — Consult Note (Addendum)
Advanced Heart Failure Team Consult Note   Primary Physician: [name], MD PCP-Cardiologist:  Mertie Moores, MD  Reason for Consultation: Heart Failure   HPI:    Scott Greer is seen today for evaluation of heart failure at the request of Dr Audie Box.   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 He has difficulty paying for medications. He stopped getting meds about 9 months ago. He has difficulty paying for housing.   Presented to [facility] ED on 2/21 with lower extremity edema and weakness. Pertinent labs included: BNP 2579, creatinine 1.7, HIV NR, SARS 2 negative.  ECHO completed and showed biventricular HF (LVEF 25-30% + severely reduced RV). For the last several days he has been diuresing with IV lasix and started on entresto, jardiance, and spiro. Yesterday having high PVC burden so Toprol XL increased to 100 mg daily.   Had cath today with elevated filling pressures, low output HF, and min CAD.   RHC/LHC Ost LAD -Prox LAD 30% stenosed RA 17 PA 47/27 (36)  PCWP 22 CO 3 CI 1.3  Review of Systems: [y] = yes, _0  = no   . General: Weight gain [Y; Weight loss _1 ; Anorexia _2 ; Fatigue [ Y]; Fever _3 ; Chills _4 ; Weakness _5   . Cardiac: Chest pain/pressure _6 ; Resting SOB _7 ; Exertional SOB [ Y]; Orthopnea [Y ]; Pedal Edema _8 ; Palpitations _9 ; Syncope _10 ; Presyncope _11 ; Paroxysmal nocturnal dyspnea_12   . Pulmonary: Cough _13 ; Wheezing_14 ; Hemoptysis_15 ; Sputum _16 ; Snoring _17   . GI: Vomiting_18 ; Dysphagia_19 ; Melena_20 ; Hematochezia _21 ; Heartburn_22 ; Abdominal pain _23 ; Constipation _24 ; Diarrhea _25 ; BRBPR _26   . GU: Hematuria_27 ; Dysuria _28 ; Nocturia_29   . Vascular: Pain in legs with walking _30 ; Pain in feet with lying flat _31 ; Non-healing sores _32 ; Stroke _33 ; TIA _34 ; Slurred speech _35 ;   . Neuro: Headaches_36 ; Vertigo_37 ; Seizures_38 ; Paresthesias_39 ;Blurred vision _40 ; Diplopia _41 ; Vision changes _42   . Ortho/Skin: Arthritis _43 ; Joint pain [Y]; Muscle pain _44 ; Joint swelling _45 ; Back Pain _46 ; Rash _47   . Psych: Depression_48 ; Anxiety_49   . Heme: Bleeding problems _50 ; Clotting disorders _51 ; Anemia _52   . Endocrine: Diabetes [ Y]; Thyroid dysfunction_53   Home Medications Prior to Admission medications   Medication Sig Start Date End Date Taking? Authorizing Provider  atorvastatin (LIPITOR) 10 MG tablet Take 1 tablet (10 mg total) by mouth daily. 12/14/20  Yes [name], Cari S, PA-C  Blood Glucose Monitoring Suppl (TRUE METRIX METER) w/Device KIT Use as directed 10/23/19  Yes [name], MD  glucose blood (TRUE METRIX BLOOD GLUCOSE TEST) test strip Use as instructed 10/23/19  Yes [name], MD  lisinopril (ZESTRIL) 10 MG tablet Take 1 tablet (10 mg total) by mouth daily. 12/14/20  Yes [name], Cari S, PA-C  metFORMIN (GLUCOPHAGE) 500 MG tablet Take 1 tablet (500 mg total) by mouth 2 (two) times daily with a meal. 12/14/20  Yes [name], Cari S, PA-C  TRUEplus Lancets 28G MISC Use as directed 10/23/19  Yes [name], MD  Vitamin D,  Ergocalciferol, (DRISDOL) 1.25 MG (50000 UNIT) CAPS capsule Take 1 capsule (50,000 Units total) by mouth every 7 (seven) days. 12/16/20  Yes [name], [name], PA-C    Past Medical History: Past Medical History:  Diagnosis Date  . Dehiscence of amputation stump (HCC)    left great toe  . Diabetes mellitus without complication ([place]) 9417   Type II  . Glaucoma 2015  . Hyperlipidemia   . Hypertension 2005  . Osteomyelitis ([place])   . Wears glasses     Past Surgical History: Past Surgical History:  Procedure Laterality Date  . AMPUTATION Left 06/05/2019   Procedure: LEFT GREAT TOE AMPUTATION;  Surgeon: Newt Minion, MD;  Location: [place];  Service: Orthopedics;  Laterality: Left;  . AMPUTATION Left 07/10/2019   Procedure:  LEFT FOOT 1ST RAY AMPUTATION;  Surgeon: Newt Minion, MD;  Location: [place];  Service: Orthopedics;  Laterality: Left;  . AMPUTATION Right 05/25/2020   Procedure: RIGHT GREAT TOE AMPUTATION;  Surgeon: Newt Minion, MD;  Location: [place];  Service: Orthopedics;  Laterality: Right;  . NO PAST SURGERIES      Family History: Family History  Problem Relation Age of Onset  . Stroke Mother   . Diabetes Mother        Toward end of life    Social History: Social History   Socioeconomic History  . Marital status: Legally Separated    Spouse name: Nira Conn  . Number of children: 2  . Years of education: Not on file  . Highest education level: Associate degree: occupational, Hotel manager, or vocational program  Occupational History  . Not on file  Tobacco Use  . Smoking status: Current Every Day Smoker    Types: Cigars, Cigarettes  . Smokeless tobacco: Former Systems developer    Types: Chew  . Tobacco comment: 6 daily  Vaping Use  . Vaping Use: Never used  Substance and Sexual Activity  . Alcohol use: Yes    Alcohol/week: 11.0 standard drinks    Types: 3 Cans of beer, 8 Shots of liquor per week    Comment: occasional  . Drug use: Yes    Types: Marijuana    Comment: ocassional - last time 05/08/2020  . Sexual activity: Not Currently  Other Topics Concern  . Not on file  Social History Narrative   Out of prison for 2 months.   Lives at home with his wife.   Social Determinants of Health   Financial Resource Strain: High Risk  . Difficulty of Paying Living Expenses: Very hard  Food Insecurity: Food Insecurity Present  . Worried About Charity fundraiser in the Last Year: Sometimes true  . Ran Out of Food in the Last Year: Sometimes true  Transportation Needs: Unmet Transportation Needs  . Lack of Transportation (Medical): Yes  . Lack of Transportation (Non-Medical): Yes  Physical Activity: Inactive  . Days of Exercise per Week: 0 days  . Minutes of Exercise per Session: 0 min  Stress: Not  on file  Social Connections: Not on file    Allergies:  Allergies  Allergen Reactions  . Bee Venom Hives, Itching and Swelling    Objective:    Vital Signs:   Temp:  [98.2 F (36.8 C)-99.2 F (37.3 C)] 98.4 F (36.9 C) (02/28 0924) Pulse Rate:  [80-89] 81 (02/28 1101) Resp:  [12-26] 19 (02/28 1101) BP: (104-134)/(78-102) 131/100 (02/28 1101) SpO2:  [90 %-100 %] 100 % (02/28 1101) Weight:  [105.6 kg] 105.6 kg (02/28  0326) Last BM Date: 01/01/21  Weight change: Filed Weights   12/31/20 0353 01/01/21 0506 01/02/21 0326  Weight: 109.4 kg 107.2 kg 105.6 kg    Intake/Output:   Intake/Output Summary (Last 24 hours) at 01/02/2021 1115 Last data filed at 01/02/2021 0854 Gross per 24 hour  Intake 893 ml  Output 2450 ml  Net -1557 ml      Physical Exam    General: No resp difficulty HEENT: normal Neck: supple. JVP to jaw  Carotids 2+ bilat; no bruits. No lymphadenopathy or thyromegaly appreciated. Cor: PMI nondisplaced. Regular rate & rhythm. No rubs, gallops or murmurs. Lungs: clear Abdomen: soft, nontender, nondistended. No hepatosplenomegaly. No bruits or masses. Good bowel sounds. Extremities: Extremities cool, no cyanosis, clubbing, rash, R and LLE 2-3+ edema. Amputation bilateral great toe.  Neuro: alert & orientedx3, cranial nerves grossly intact. moves all 4 extremities w/o difficulty. Affect pleasant   Telemetry   SR 80s with PVCs   EKG    N/A  Labs   Basic Metabolic Panel: Recent Labs  Lab 12/29/20 0315 12/30/20 0306 12/31/20 0311 01/01/21 0419 01/02/21 0222  NA 140 141 139 141 141  K 4.3 3.9 4.1 3.8 4.1  CL 103 104 103 101 103  CO2 _0 GLUCOSE 215* 159* 161* 160* 127*  BUN 33* 30* 29* 28* 32*  CREATININE 1.50* 1.44* 1.45* 1.51* 1.65*  CALCIUM 8.5* 8.6* 8.7* 8.9 9.0  MG 1.7 1.8 2.0 1.9 1.9    Liver Function Tests: No results for input(s): AST, ALT, ALKPHOS, BILITOT, PROT, ALBUMIN in the last 168 hours. No results for  input(s): LIPASE, AMYLASE in the last 168 hours. No results for input(s): AMMONIA in the last 168 hours.  CBC: Recent Labs  Lab 12/28/20 0349  WBC 5.7  NEUTROABS 4.0  HGB 12.7*  HCT 39.0  MCV 82.8  PLT 266    Cardiac Enzymes: No results for input(s): CKTOTAL, CKMB, CKMBINDEX, TROPONINI in the last 168 hours.  BNP: BNP (last 3 results) Recent Labs    12/25/20 0019  BNP 2,579.0*    ProBNP (last 3 results) No results for input(s): PROBNP in the last 8760 hours.   CBG: Recent Labs  Lab 01/01/21 0614 01/01/21 1218 01/01/21 1555 01/01/21 2133 01/02/21 0624  GLUCAP 134* 218* 189* 230* 126*    Coagulation Studies: No results for input(s): LABPROT, INR in the last 72 hours.   Imaging   [place] EKG SITE RITE  Result Date: 01/02/2021 If [facility] image not attached, placement could not be confirmed due to current cardiac rhythm.     Medications:     Current Medications: . [MAR Hold] atorvastatin  10 mg Oral Daily  . [MAR Hold] empagliflozin  10 mg Oral Daily  . [MAR Hold] heparin  5,000 Units Subcutaneous Q8H  . [MAR Hold] insulin aspart  0-15 Units Subcutaneous TID WC  . [MAR Hold] insulin aspart  0-5 Units Subcutaneous QHS  . [MAR Hold] metoprolol succinate  100 mg Oral Daily  . [MAR Hold] sacubitril-valsartan  1 tablet Oral BID  . [MAR Hold] sodium chloride flush  3 mL Intravenous Q12H  . sodium chloride flush  3 mL Intravenous Q12H  . [MAR Hold] spironolactone  12.5 mg Oral Daily     Infusions: . [MAR Hold] sodium chloride    . sodium chloride    . sodium chloride 10 mL/hr at 01/02/21 0602        Assessment/Plan   1. Acute Biventricular Heart  Failure -->Cardiogenic Shock  - ECHO LVEF 25-30% RV severely reduced  - Cath today with elevated filling pressures and low output heart failure. NICM . HIV NR. Check TSH in am. ? HTN/ETOH/PVCs.  -Start milrinone 0.25 mcg. Place PICC line. Follow CVP/CO-OX - Start lasix drip 10 mg per hour.  - Hold bb  with low output - Cut back entresto 24-26 mg twice a day  - Continue jardiance 10 mg daily  - Continue spironolactone 12.5 mg daily   2. CKD Stage IIIb -Creatinine on admit 1.6  -Creatinine today 1.6 -Follow bmet daily.  - Watch for contrast nephropathy  3. PVCs -High burden -Start amio 200 mg twice a day . May need to place on amio drip.   4. HTN -Not elevated.   5. DMII -Hgb A1C 8.4 2 weeks ago -Continue SSI  6. Tobacco Abuse  7. Social  Consult HFSW Team. He has hard time paying for medications/housing.      Length of Stay: 7  Amy Clegg, NP  01/02/2021, 11:15 AM  Advanced Heart Failure Team Pager [phone] (M-F; 7a - 4p)  Please contact [facility] for night-coverage after hours (4p -7a ) and weekends on [email or web address]  Patient seen with NP, agree with the above note.   Patient presented with new onset CHF, nonischemic cardiomyopathy.  No echo prior to this admission. I reviewed his echo, severe LV systolic dysfunction with EF 20-25% range on my read, but also severe RV dysfunction.    He has been diuresed and started on cardiac meds, but cath today showed ongoing volume overload with markedly low cardiac index (1.3).  Nonobstructive mild CAD.   Creatinine trending up, 1.45 => 1.65  General: NAD Neck: JVP 16 cm, no thyromegaly or thyroid nodule.  Lungs: Clear to auscultation bilaterally with normal respiratory effort. CV: Lateral PMI.  Heart regular S1/S2, no S3/S4, no murmur.  1+ edema to knees.  No carotid bruit.  Normal pedal pulses.  Abdomen: Soft, nontender, no hepatosplenomegaly, no distention.  Skin: Intact without lesions or rashes.  Neurologic: Alert and oriented x 3.  Psych: Normal affect. Extremities: No clubbing or cyanosis.  Cool extremities.  HEENT: Normal.   1. Acute systolic CHF: Echo this admission reviewed, EF 20-25% range with severe RV dysfunction.  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  HIV negative.  He drinks  but not markedly heavily (pint/week liquor).  He apparently had poorly controlled HTN prior to admission (not taking meds for about 9 months). 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  RHC today showed elevated filling pressures especially on the right side, also low CI at 1.3. Needs further diuresis.  Creatinine up to 1.65, need to be careful as had contrast today with cath.  - Place PICC to follow CVP and co-ox.  - Start milrinone 0.25 mcg/kg/min.  - Hold Toprol XL with markedly low output and volume overload.  - He had Lasix 120 mg IV bolus today, start Lasix gtt 12 mg/hr and follow response.  - Decrease Entresto to 24/26 bid for now (rise in creatinine, SBP 100s-110s).   - Continue spironolactone and Jardiance.  - Add digoxin 0.125 daily.  - Needs full diuresis then try to wean off milrinone.  Given social situation and ?ETOH abuse, he would not be a good candidate for advanced therapies at this time.  2. CKD: Stage 3a.  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  Follow closely with diuresis.  3. HTN: BP currently lower, SBP 100s-110s.  4. PVCs: There appears to be a high burden by telemetry, may be worse with addition of milrinone.  Would avoid high dose Toprol XL for now with low output/volume overload.  - Add amiodarone 200 mg bid to try to control PVCs.    Loralie Champagne 01/02/2021 12:26 PM

## 2021-01-02 NOTE — H&P (View-Only) (Signed)
Cardiology Progress Note  Patient ID: Scott Greer MRN: [id number] DOB: Jun 08, 1956 Date of Encounter: 01/02/2021  Primary Cardiologist: Kristeen Miss, MD  Subjective   Chief Complaint: Shortness of breath  HPI: Good urine output.  Net -1.8 L overnight.  Net -11.8 L since admission.  Left and right heart catheterization today.  ROS:  All other ROS reviewed and negative. Pertinent positives noted in the HPI.     Inpatient Medications  Scheduled Meds: . atorvastatin  10 mg Oral Daily  . empagliflozin  10 mg Oral Daily  . heparin  5,000 Units Subcutaneous Q8H  . insulin aspart  0-15 Units Subcutaneous TID WC  . insulin aspart  0-5 Units Subcutaneous QHS  . metoprolol succinate  100 mg Oral Daily  . sacubitril-valsartan  1 tablet Oral BID  . sodium chloride flush  3 mL Intravenous Q12H  . sodium chloride flush  3 mL Intravenous Q12H  . spironolactone  12.5 mg Oral Daily   Continuous Infusions: . sodium chloride    . sodium chloride    . sodium chloride 10 mL/hr at 01/02/21 0602   PRN Meds: sodium chloride, sodium chloride, acetaminophen, ondansetron (ZOFRAN) IV, sodium chloride flush, sodium chloride flush, sodium chloride flush   Vital Signs   Vitals:   01/01/21 1559 01/01/21 2049 01/02/21 0326 01/02/21 0924  BP: 104/78 129/90 (!) 121/96 113/78  Pulse: 89 87 84 84  Resp: 18 16 20 18   Temp: 98.2 F (36.8 C) 99.2 F (37.3 C) 98.7 F (37.1 C) 98.4 F (36.9 C)  TempSrc: Oral Oral Oral Oral  SpO2: 98% 100% 100% 100%  Weight:   105.6 kg   Height:        Intake/Output Summary (Last 24 hours) at 01/02/2021 0934 Last data filed at 01/02/2021 0854 Gross per 24 hour  Intake 893 ml  Output 2450 ml  Net -1557 ml   Last 3 Weights 01/02/2021 01/01/2021 12/31/2020  Weight (lbs) 232 lb 14.4 oz 236 lb 6.4 oz 241 lb 2.9 oz  Weight (kg) 105.643 kg 107.23 kg 109.4 kg      Telemetry  Overnight telemetry shows sinus rhythm in the 70s with PVCs, which I personally reviewed.     Physical Exam   Vitals:   01/01/21 1559 01/01/21 2049 01/02/21 0326 01/02/21 0924  BP: 104/78 129/90 (!) 121/96 113/78  Pulse: 89 87 84 84  Resp: 18 16 20 18   Temp: 98.2 F (36.8 C) 99.2 F (37.3 C) 98.7 F (37.1 C) 98.4 F (36.9 C)  TempSrc: Oral Oral Oral Oral  SpO2: 98% 100% 100% 100%  Weight:   105.6 kg   Height:         Intake/Output Summary (Last 24 hours) at 01/02/2021 0934 Last data filed at 01/02/2021 0854 Gross per 24 hour  Intake 893 ml  Output 2450 ml  Net -1557 ml    Last 3 Weights 01/02/2021 01/01/2021 12/31/2020  Weight (lbs) 232 lb 14.4 oz 236 lb 6.4 oz 241 lb 2.9 oz  Weight (kg) 105.643 kg 107.23 kg 109.4 kg    Body mass index is 29.11 kg/m.  General: Well nourished, well developed, in no acute distress Head: Atraumatic, normal size  Eyes: PEERLA, EOMI  Neck: Supple, JVD 15 to 17 cm of water Endocrine: No thryomegaly Cardiac: Normal S1, S2; RRR; no murmurs, rubs, or gallops Lungs: Crackles at the lung bases Abd: Soft, nontender, no hepatomegaly  Ext: 2+ pitting edema up to the knees Musculoskeletal: No deformities, BUE and BLE  strength normal and equal Skin: Warm and dry, no rashes   Neuro: Alert and oriented to person, place, time, and situation, CNII-XII grossly intact, no focal deficits  Psych: Normal mood and affect   Labs  High Sensitivity Troponin:   Recent Labs  Lab 12/25/20 1906 12/25/20 2251  TROPONINIHS 25* 28*     Cardiac EnzymesNo results for input(s): TROPONINI in the last 168 hours. No results for input(s): TROPIPOC in the last 168 hours.  Chemistry Recent Labs  Lab 12/31/20 0311 01/01/21 0419 01/02/21 0222  NA 139 141 141  K 4.1 3.8 4.1  CL 103 101 103  CO2 27 31 27   GLUCOSE 161* 160* 127*  BUN 29* 28* 32*  CREATININE 1.45* 1.51* 1.65*  CALCIUM 8.7* 8.9 9.0  GFRNONAA 54* 52* 47*  ANIONGAP 9 9 11     Hematology Recent Labs  Lab 12/28/20 0349  WBC 5.7  RBC 4.71  HGB 12.7*  HCT 39.0  MCV 82.8  MCH 27.0  MCHC  32.6  RDW 16.3*  PLT 266   BNPNo results for input(s): BNP, PROBNP in the last 168 hours.  DDimer No results for input(s): DDIMER in the last 168 hours.   Radiology  No results found.  Cardiac Studies  TTE 12/26/2020 1. Left ventricular ejection fraction, by estimation, is 25 to 30%. The  left ventricle has severely decreased function. The left ventricle  demonstrates global hypokinesis. The left ventricular internal cavity size  was mildly dilated. There is mild left  ventricular hypertrophy. Left ventricular diastolic parameters are  indeterminate.  2. Right ventricular systolic function is severely reduced. The right  ventricular size is mildly enlarged. There is mildly elevated pulmonary  artery systolic pressure.  3. Right atrial size was mildly dilated.  4. The mitral valve is abnormal. Mild mitral valve regurgitation.  5. The aortic valve is grossly normal. Aortic valve regurgitation is not  visualized. Mild to moderate aortic valve sclerosis/calcification is  present, without any evidence of aortic stenosis.  6. The inferior vena cava is dilated in size with <50% respiratory  variability, suggesting right atrial pressure of 15 mmHg.   Patient Profile  Scott Greer a [birth year]63 y.o.malewith type 2 diabetes, hypertension, hyperlipidemia, peripheral osteomyelitis with great toe amputation, tobacco abuse who was admitted on 12/25/2020 for new onset systolic heart failure.  Assessment & Plan   1.  New onset systolic heart failure, EF 25 to 30% -Still volume overloaded.  He only received 1 dose of Lasix yesterday.  We will resume Lasix after his heart cath. -He has several more days of IV diuresis. -Continue metoprolol succinate 100 mg daily.  On Entresto 49-51 twice daily.  He is on Aldactone 12.5 mg daily.  We started Jardiance 10 mg daily as well. -Heart cath today to determine etiology.  Suspect this could be ischemic.  2.  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  On  beta-blocker.  Shared Decision Making/Informed Consent The risks [stroke (1 in 1000), death (1 in 1000), kidney failure [usually temporary] (1 in 500), bleeding (1 in 200), allergic reaction [possibly serious] (1 in 200)], benefits (diagnostic support and management of coronary artery disease) and alternatives of a cardiac catheterization were discussed in detail with Mr. Langham and he is willing to proceed.  For questions or updates, please contact [facility] Please consult [email or web address] for contact info under   Time Spent with Patient: I have spent a total of 25 minutes with patient reviewing hospital notes, telemetry, EKGs, labs and examining the  patient as well as establishing an assessment and plan that was discussed with the patient.  > 50% of time was spent in direct patient care.    Signed, Lenna Gilford. Flora Lipps, MD [facility]  [facility]  01/02/2021 9:34 AM

## 2021-01-02 NOTE — Progress Notes (Signed)
Patient ID: Scott Greer, male   DOB: [birth year], 65 y.o.   MRN: [id number]  PROGRESS NOTE    CORNEL WERBER  GHW:[id number] DOB: Jul 26, 1956 DOA: 12/25/2020 PCP: [name], MD   Brief Narrative:  65 year old male with history of diabetes mellitus type 2, nonadherent to medical therapy, hypertension, left great toe amputation due to osteomyelitis presented on 12/25/2020 with bilateral lower extremity swelling/edema to scrotum along with dyspnea on exertion.  On presentation, BNP was 2579 with chest x-ray showing mild right basilar atelectasis.  He was started on IV Lasix.  Cardiology was consulted  Assessment & Plan:   Acute on chronic combined systolic and diastolic heart failure -Echo showed EF of 25 to 30% with left ventricular global hypokinesis and severely decreased function -Strict input and output.  Daily weights.  Fluid restriction.  Negative balance of 11,822 cc since admission. -Cardiology following and managing diuretics.  Currently on IV Lasix and oral spironolactone along with oral metoprolol and Entresto.  Monitor creatinine -Cardiology planning for cath probably today.  Diabetes mellitus type 2 uncontrolled with hyperglycemia -A1c 8.4 on 12/14/2020.  Continue CBGs with SSI.  Metformin on hold.  Carb modified diet  AKI on CKD Stage IIIa -monitor kidney function.  Currently stable  Essential hypertension Noncompliance -Nonadherent to medications.  Monitor blood pressure.   -Continue Lasix and Entresto  Positive for microalbuminuria -24-hour urine collection ongoing shows urine protein of 2747.  Spoke to Dr. Minerva Areola on 12/29/2020 who will arrange for outpatient nephrology evaluation and follow-up.  DVT prophylaxis: Lovenox Code Status: Full Family Communication: None Disposition Plan: Status is: Inpatient  Remains inpatient appropriate because:Inpatient level of care appropriate due to severity of illness   Dispo: The patient is from: Home               Anticipated d/c is to: Home              Anticipated d/c date is: 2 days              Patient currently is not medically stable to d/c.   Difficult to place patient No  Consultants: Cardiology/Spoke to Dr. Minerva Areola on 12/29/2020 who will arrange for outpatient nephrology evaluation and follow-up.  Procedures: Echo  Antimicrobials: None   Subjective: Patient seen and examined at bedside.  Still feels some shortness of breath with minimal exertion.  No overnight fever, vomiting, chest pain reported.   Objective: Vitals:   01/01/21 1151 01/01/21 1559 01/01/21 2049 01/02/21 0326  BP: 116/89 104/78 129/90 (!) 121/96  Pulse: 85 89 87 84  Resp: 20 18 16 20   Temp: 98.3 F (36.8 C) 98.2 F (36.8 C) 99.2 F (37.3 C) 98.7 F (37.1 C)  TempSrc: Oral Oral Oral Oral  SpO2: 97% 98% 100% 100%  Weight:    105.6 kg  Height:        Intake/Output Summary (Last 24 hours) at 01/02/2021 0740 Last data filed at 01/02/2021 01/04/2021 Gross per 24 hour  Intake 1133 ml  Output 3000 ml  Net -1867 ml   Filed Weights   12/31/20 0353 01/01/21 0506 01/02/21 0326  Weight: 109.4 kg 107.2 kg 105.6 kg    Examination:  General exam: No distress.  Chronically ill-appearing.  Currently on room air.  Poor historian. Respiratory system: Bilateral decreased breath sounds at bases with bibasilar crackles cardiovascular system: Rate controlled, S1-S2 heard gastrointestinal system: Abdomen is slightly distended, soft and nontender.  Normal bowel sounds heard  extremities: Mild lower extremity  edema present; no clubbing  data Reviewed: I have personally reviewed following labs and imaging studies  CBC: Recent Labs  Lab 12/28/20 0349  WBC 5.7  NEUTROABS 4.0  HGB 12.7*  HCT 39.0  MCV 82.8  PLT 266   Basic Metabolic Panel: Recent Labs  Lab 12/29/20 0315 12/30/20 0306 12/31/20 0311 01/01/21 0419 01/02/21 0222  NA 140 141 139 141 141  K 4.3 3.9 4.1 3.8 4.1  CL 103 104 103 101 103  CO2 24  28 27 31 27   GLUCOSE 215* 159* 161* 160* 127*  BUN 33* 30* 29* 28* 32*  CREATININE 1.50* 1.44* 1.45* 1.51* 1.65*  CALCIUM 8.5* 8.6* 8.7* 8.9 9.0  MG 1.7 1.8 2.0 1.9 1.9   GFR: Estimated Creatinine Clearance: 61 mL/min (A) (by C-G formula based on SCr of 1.65 mg/dL (H)). Liver Function Tests: No results for input(s): AST, ALT, ALKPHOS, BILITOT, PROT, ALBUMIN in the last 168 hours. No results for input(s): LIPASE, AMYLASE in the last 168 hours. No results for input(s): AMMONIA in the last 168 hours. Coagulation Profile: No results for input(s): INR, PROTIME in the last 168 hours. Cardiac Enzymes: No results for input(s): CKTOTAL, CKMB, CKMBINDEX, TROPONINI in the last 168 hours. BNP (last 3 results) No results for input(s): PROBNP in the last 8760 hours. HbA1C: No results for input(s): HGBA1C in the last 72 hours. CBG: Recent Labs  Lab 01/01/21 0614 01/01/21 1218 01/01/21 1555 01/01/21 2133 01/02/21 0624  GLUCAP 134* 218* 189* 230* 126*   Lipid Profile: No results for input(s): CHOL, HDL, LDLCALC, TRIG, CHOLHDL, LDLDIRECT in the last 72 hours. Thyroid Function Tests: No results for input(s): TSH, T4TOTAL, FREET4, T3FREE, THYROIDAB in the last 72 hours. Anemia Panel: No results for input(s): VITAMINB12, FOLATE, FERRITIN, TIBC, IRON, RETICCTPCT in the last 72 hours. Sepsis Labs: No results for input(s): PROCALCITON, LATICACIDVEN in the last 168 hours.  Recent Results (from the past 240 hour(s))  SARS CORONAVIRUS 2 (TAT 6-24 HRS) Nasopharyngeal Nasopharyngeal Swab     Status: None   Collection Time: 12/26/20  2:09 AM   Specimen: Nasopharyngeal Swab  Result Value Ref Range Status   SARS Coronavirus 2 NEGATIVE NEGATIVE Final    Comment: (NOTE) SARS-CoV-2 target nucleic acids are NOT DETECTED.  The SARS-CoV-2 RNA is generally detectable in upper and lower respiratory specimens during the acute phase of infection. Negative results do not preclude SARS-CoV-2 infection, do not  rule out co-infections with other pathogens, and should not be used as the sole basis for treatment or other patient management decisions. Negative results must be combined with clinical observations, patient history, and epidemiological information. The expected result is Negative.  Fact Sheet for Patients: 12/28/20  Fact Sheet for Healthcare Providers: HairSlick.no  This test is not yet approved or cleared by the [place] FDA and  has been authorized for detection and/or diagnosis of SARS-CoV-2 by FDA under an Emergency Use Authorization (EUA). This EUA will remain  in effect (meaning this test can be used) for the duration of the COVID-19 declaration under Se ction 564(b)(1) of the Act, 21 U.S.C. section 360bbb-3(b)(1), unless the authorization is terminated or revoked sooner.  Performed at [facility], [address], [place], [place]          Radiology Studies: No results found.      Scheduled Meds: . atorvastatin  10 mg Oral Daily  . empagliflozin  10 mg Oral Daily  . heparin  5,000 Units Subcutaneous Q8H  .  insulin aspart  0-15 Units Subcutaneous TID WC  . insulin aspart  0-5 Units Subcutaneous QHS  . metoprolol succinate  100 mg Oral Daily  . sacubitril-valsartan  1 tablet Oral BID  . sodium chloride flush  3 mL Intravenous Q12H  . sodium chloride flush  3 mL Intravenous Q12H  . spironolactone  12.5 mg Oral Daily   Continuous Infusions: . sodium chloride    . sodium chloride    . sodium chloride 10 mL/hr at 01/02/21 0602          Glade Lloyd, MD [facility] 01/02/2021, 7:40 AM

## 2021-01-03 ENCOUNTER — Encounter (HOSPITAL_COMMUNITY): Payer: Medicaid Other

## 2021-01-03 ENCOUNTER — Telehealth (HOSPITAL_COMMUNITY): Payer: Self-pay | Admitting: Pharmacy Technician

## 2021-01-03 DIAGNOSIS — I5043 Acute on chronic combined systolic (congestive) and diastolic (congestive) heart failure: Secondary | ICD-10-CM | POA: Diagnosis not present

## 2021-01-03 DIAGNOSIS — I5021 Acute systolic (congestive) heart failure: Secondary | ICD-10-CM | POA: Diagnosis not present

## 2021-01-03 DIAGNOSIS — Z7189 Other specified counseling: Secondary | ICD-10-CM | POA: Diagnosis not present

## 2021-01-03 DIAGNOSIS — E1121 Type 2 diabetes mellitus with diabetic nephropathy: Secondary | ICD-10-CM | POA: Diagnosis not present

## 2021-01-03 DIAGNOSIS — N1831 Chronic kidney disease, stage 3a: Secondary | ICD-10-CM | POA: Diagnosis not present

## 2021-01-03 DIAGNOSIS — I509 Heart failure, unspecified: Secondary | ICD-10-CM | POA: Diagnosis not present

## 2021-01-03 DIAGNOSIS — Z419 Encounter for procedure for purposes other than remedying health state, unspecified: Secondary | ICD-10-CM | POA: Diagnosis not present

## 2021-01-03 DIAGNOSIS — Z515 Encounter for palliative care: Secondary | ICD-10-CM

## 2021-01-03 DIAGNOSIS — I214 Non-ST elevation (NSTEMI) myocardial infarction: Secondary | ICD-10-CM | POA: Diagnosis not present

## 2021-01-03 DIAGNOSIS — R809 Proteinuria, unspecified: Secondary | ICD-10-CM | POA: Diagnosis not present

## 2021-01-03 DIAGNOSIS — I10 Essential (primary) hypertension: Secondary | ICD-10-CM | POA: Diagnosis not present

## 2021-01-03 DIAGNOSIS — Z9114 Patient's other noncompliance with medication regimen: Secondary | ICD-10-CM | POA: Diagnosis not present

## 2021-01-03 DIAGNOSIS — N179 Acute kidney failure, unspecified: Secondary | ICD-10-CM | POA: Diagnosis not present

## 2021-01-03 LAB — CBC
HCT: 36.3 % — ABNORMAL LOW (ref 39.0–52.0)
Hemoglobin: 12.1 g/dL — ABNORMAL LOW (ref 13.0–17.0)
MCH: 27.4 pg (ref 26.0–34.0)
MCHC: 33.3 g/dL (ref 30.0–36.0)
MCV: 82.1 fL (ref 80.0–100.0)
Platelets: 254 10*3/uL (ref 150–400)
RBC: 4.42 MIL/uL (ref 4.22–5.81)
RDW: 16.7 % — ABNORMAL HIGH (ref 11.5–15.5)
WBC: 6.3 10*3/uL (ref 4.0–10.5)
nRBC: 0 % (ref 0.0–0.2)

## 2021-01-03 LAB — BASIC METABOLIC PANEL
Anion gap: 14 (ref 5–15)
BUN: 38 mg/dL — ABNORMAL HIGH (ref 8–23)
CO2: 27 mmol/L (ref 22–32)
Calcium: 9.4 mg/dL (ref 8.9–10.3)
Chloride: 99 mmol/L (ref 98–111)
Creatinine, Ser: 1.8 mg/dL — ABNORMAL HIGH (ref 0.61–1.24)
GFR, Estimated: 42 mL/min — ABNORMAL LOW (ref >60)
Glucose, Bld: 169 mg/dL — ABNORMAL HIGH (ref 70–99)
Potassium: 4.3 mmol/L (ref 3.5–5.1)
Sodium: 140 mmol/L (ref 135–145)

## 2021-01-03 LAB — GLUCOSE, CAPILLARY
Glucose-Capillary: 233 mg/dL — ABNORMAL HIGH (ref 70–99)
Glucose-Capillary: 126 mg/dL — ABNORMAL HIGH (ref 70–99)
Glucose-Capillary: 214 mg/dL — ABNORMAL HIGH (ref 70–99)
Glucose-Capillary: 150 mg/dL — ABNORMAL HIGH (ref 70–99)

## 2021-01-03 LAB — MAGNESIUM: Magnesium: 2 mg/dL (ref 1.7–2.4)

## 2021-01-03 NOTE — Progress Notes (Signed)
Patient has refused tele monitor vitals labs and has no IV access,

## 2021-01-03 NOTE — Telephone Encounter (Signed)
Patient Advocate Encounter   Received notification from [facility] that prior authorization for Sherryll Burger is required.   PA submitted on CoverMyMeds Key [id number] Status is pending   Will continue to follow.  Patient Advocate Encounter   Received notification from [facility] that prior authorization for London Pepper is required.   PA submitted on CoverMyMeds Key [id number] Status is pending   Will continue to follow.

## 2021-01-03 NOTE — Progress Notes (Signed)
Pt. Discontinued PICC site. IV infusions stopped. On call for TRH/Cardiology paged to make aware. Pt. Will not keep telemetry leads on, replaced multiple times. Pt. Currently on standby. CCMD aware. Pt. Non-compliant. Inaccurate output obtained as urine noted in floor.

## 2021-01-03 NOTE — Progress Notes (Signed)
VAST consulted to obtain IV access and labs with IV start.  Pt eating breakfast and speaking with hospital employee. VAST RN asked to return after 40 mins.

## 2021-01-03 NOTE — Plan of Care (Signed)
  Problem: Education: Goal: Knowledge of General Education information will improve Description: Including pain rating scale, medication(s)/side effects and non-pharmacologic comfort measures Outcome: Progressing   Problem: Clinical Measurements: Goal: Respiratory complications will improve Outcome: Progressing   Problem: Clinical Measurements: Goal: Cardiovascular complication will be avoided Outcome: Progressing   

## 2021-01-03 NOTE — Progress Notes (Signed)
Scott Greer ID: Scott Greer, male   DOB: Oct 07, 1956, 65 y.o.   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  On presentation, BNP was 2579 with chest x-ray showing mild right basilar atelectasis.  He was started on IV Lasix.  Cardiology was consulted. Subsequently, echo showed EF of 25 to 30%. He underwent cardiac catheterization on 01/02/2021 which showed nonobstructive CAD  Assessment & Plan:   Acute on chronic combined systolic and diastolic heart failure -Echo showed EF of 25 to 30% with left ventricular global hypokinesis and severely decreased function -Strict input and output.  Daily weights.  Fluid restriction.  Negative balance of 95621 cc since admission. -Cardiology following and managing diuretics.  Heart failure team was consulted by cardiology on 01/02/2021.  PICC line placed on 01/02/2021.  Started on Lasix and milrinone drips by heart failure team on 01/02/2021.  Continue spironolactone, metoprolol and Entresto.  Monitor creatinine -Scott Greer subsequently discontinued PICC line overnight. Will follow further heart failure team's recommendations. -Status post cardiac catheterization on 01/02/2021 which showed nonobstructive CAD  Diabetes mellitus type 2 uncontrolled with hyperglycemia -A1c 8.4 on 12/14/2020.  Continue CBGs with SSI.  Metformin on hold.  Carb modified diet  AKI on CKD Stage IIIa -monitor kidney function.  Creatinine 1.8 today.  Essential hypertension Noncompliance -Nonadherent to medications.  Monitor blood pressure.   -Continue Lasix and Entresto  Positive for microalbuminuria -24-hour urine collection  ongoing shows urine protein of 2747.  Spoke to Dr. Minerva Areola on 12/29/2020 who will arrange for outpatient nephrology evaluation and follow-up.  DVT prophylaxis: Lovenox Code Status: Full Family Communication: None Disposition Plan: Status is: Inpatient  Remains inpatient appropriate because:Inpatient level of Scott Greer appropriate due to severity of illness   Dispo: The Scott Greer is from: Home              Anticipated d/c is to: Home              Anticipated d/c date is: 2 days              Scott Greer currently is not medically stable to d/c.   Difficult to place Scott Greer No  Consultants: Cardiology/Spoke to Dr. Minerva Areola on 12/29/2020 who will arrange for outpatient nephrology evaluation and follow-up/heart failure team.  Procedures: Echo Cardiac catheterization on 01/02/2021  Antimicrobials: None   Subjective: Scott Greer seen and examined at bedside.  Poor historian.  Denies worsening chest pain, vomiting, fever.  Nursing staff reports that he discontinued PICC line overnight. Objective: Vitals:   01/02/21 2055 01/02/21 2056 01/03/21 0308 01/03/21 0319  BP: (!) 179/129 (!) 125/101 (!) 116/98   Pulse:  88    Resp: 11  12   Temp:   98.3 F (36.8 C)   TempSrc:   Oral   SpO2:  100% 100%   Weight:    106.3 kg  Height:        Intake/Output Summary (Last 24 hours) at 01/03/2021 0746 Last data filed at 01/03/2021 0340 Gross per 24 hour  Intake 739.01 ml  Output 1000 ml  Net -260.99 ml   Filed Weights   01/01/21 0506 01/02/21 0326 01/03/21 0319  Weight: 107.2 kg 105.6 kg 106.3 kg  Examination:  General exam: On 2 L oxygen via nasal cannula currently.  Chronically ill-appearing.  Poor historian. Respiratory system: Decreased breath sounds at bases bilaterally with scattered crackles  cardiovascular system: S1-S2 heard, rate controlled gastrointestinal system: Abdomen is slightly distended, soft and nontender.  Bowel sounds are heard  extremities: No cyanosis.   Bilateral lower extremity edema present  data Reviewed: I have personally reviewed following labs and imaging studies  CBC: Recent Labs  Lab 12/28/20 0349 01/02/21 1046 01/02/21 1050 01/03/21 0328  WBC 5.7  --   --  6.3  NEUTROABS 4.0  --   --   --   HGB 12.7* 12.9*  12.6* 12.6* 12.1*  HCT 39.0 38.0*  37.0* 37.0* 36.3*  MCV 82.8  --   --  82.1  PLT 266  --   --  254   Basic Metabolic Panel: Recent Labs  Lab 12/30/20 0306 12/31/20 0311 01/01/21 0419 01/02/21 0222 01/02/21 1046 01/02/21 1050 01/02/21 1709 01/03/21 0328  NA 141 139 141 141 141  140 140 139 140  K 3.9 4.1 3.8 4.1 4.3  4.3 4.2 4.3 4.3  CL 104 103 101 103  --   --  100 99  CO2 28 27 31 27   --   --  28 27  GLUCOSE 159* 161* 160* 127*  --   --  187* 169*  BUN 30* 29* 28* 32*  --   --  36* 38*  CREATININE 1.44* 1.45* 1.51* 1.65*  --   --  1.65* 1.80*  CALCIUM 8.6* 8.7* 8.9 9.0  --   --  9.0 9.4  MG 1.8 2.0 1.9 1.9  --   --   --  2.0   GFR: Estimated Creatinine Clearance: 56.1 mL/min (A) (by C-G formula based on SCr of 1.8 mg/dL (H)). Liver Function Tests: No results for input(s): AST, ALT, ALKPHOS, BILITOT, PROT, ALBUMIN in the last 168 hours. No results for input(s): LIPASE, AMYLASE in the last 168 hours. No results for input(s): AMMONIA in the last 168 hours. Coagulation Profile: No results for input(s): INR, PROTIME in the last 168 hours. Cardiac Enzymes: No results for input(s): CKTOTAL, CKMB, CKMBINDEX, TROPONINI in the last 168 hours. BNP (last 3 results) No results for input(s): PROBNP in the last 8760 hours. HbA1C: No results for input(s): HGBA1C in the last 72 hours. CBG: Recent Labs  Lab 01/02/21 0624 01/02/21 1203 01/02/21 1649 01/02/21 2114 01/03/21 0608  GLUCAP 126* 87 155* 120* 233*   Lipid Profile: No results for input(s): CHOL, HDL, LDLCALC, TRIG, CHOLHDL, LDLDIRECT in the last 72 hours. Thyroid Function Tests: No results for input(s): TSH, T4TOTAL, FREET4, T3FREE,  THYROIDAB in the last 72 hours. Anemia Panel: No results for input(s): VITAMINB12, FOLATE, FERRITIN, TIBC, IRON, RETICCTPCT in the last 72 hours. Sepsis Labs: No results for input(s): PROCALCITON, LATICACIDVEN in the last 168 hours.  Recent Results (from the past 240 hour(s))  SARS CORONAVIRUS 2 (TAT 6-24 HRS) Nasopharyngeal Nasopharyngeal Swab     Status: None   Collection Time: 12/26/20  2:09 AM   Specimen: Nasopharyngeal Swab  Result Value Ref Range Status   SARS Coronavirus 2 NEGATIVE NEGATIVE Final    Comment: (NOTE) SARS-CoV-2 target nucleic acids are NOT DETECTED.  The SARS-CoV-2 RNA is generally detectable in upper and lower respiratory specimens during the acute phase of infection. Negative results do not preclude SARS-CoV-2 infection, do not rule out co-infections with other pathogens, and should not be used as the sole basis  for treatment or other Scott Greer management decisions. Negative results must be combined with clinical observations, Scott Greer history, and epidemiological information. The expected result is Negative.  Fact Sheet for Patients: HairSlick.no  Fact Sheet for Healthcare Providers: quierodirigir.com  This test is not yet approved or cleared by the [place] FDA and  has been authorized for detection and/or diagnosis of SARS-CoV-2 by FDA under an Emergency Use Authorization (EUA). This EUA will remain  in effect (meaning this test can be used) for the duration of the COVID-19 declaration under Se ction 564(b)(1) of the Act, 21 U.S.C. section 360bbb-3(b)(1), unless the authorization is terminated or revoked sooner.  Performed at [facility], [address], [place], [place] [address]          Radiology Studies: CARDIAC CATHETERIZATION  Result Date: 01/02/2021  Ost LAD to Prox LAD lesion is 30% stenosed.  FLORENCIO HOLLIBAUGH is a 65 y.o. male  [id number] LOCATION:  FACILITY: [facility]  PHYSICIAN: [name], M.D. [birth year] DATE OF PROCEDURE:  01/02/2021 DATE OF DISCHARGE: CARDIAC CATHETERIZATION History obtained from chart review.Scott Greer THOMASON a 64 y.o.malewith type 2 diabetes, hypertension, hyperlipidemia, peripheral osteomyelitis with great toe amputation, tobacco abuse who was admitted on Greer for new onset systolic heart failure.  A 2D echo revealed an EF in the 20% range with global hypokinesia.  His troponins were low.  He was referred for right left heart cath to define his anatomy and physiology.   Mr. Eisemann has nonobstructive CAD principally in his proximal LAD of about 30%.  He has severe decrease in cardiac output with a cardiac index of 1.3 L/min/m elevated filling pressures.  He has a nonischemic cardiomyopathy that will need guideline directed optimal medical therapy.  He may need inotropic therapy in the short-term to optimize his hemodynamics.  The radial sheath was removed and a TR band was placed on the right wrist to achieve patent hemostasis.  The Scott Greer left lab in stable condition. [name]. Scott Greer, [facility] 01/02/2021 11:13 AM   [place] EKG SITE RITE  Result Date: 01/02/2021 If [facility] image not attached, placement could not be confirmed due to current cardiac rhythm.       Scheduled Meds: . amiodarone  200 mg Oral BID  . aspirin  81 mg Oral Daily  . atorvastatin  10 mg Oral Daily  . Chlorhexidine Gluconate Cloth  6 each Topical Daily  . digoxin  0.125 mg Oral Daily  . empagliflozin  10 mg Oral Daily  . insulin aspart  0-15 Units Subcutaneous TID WC  . insulin aspart  0-5 Units Subcutaneous QHS  . sacubitril-valsartan  1 tablet Oral BID  . sodium chloride flush  10-40 mL Intracatheter Q12H  . sodium chloride flush  3 mL Intravenous Q12H  . sodium chloride flush  3 mL Intravenous Q12H  . spironolactone  12.5 mg Oral Daily   Continuous Infusions: . sodium chloride Stopped (01/03/21 0340)  . sodium chloride Stopped (01/03/21 0340)  .  furosemide (LASIX) 200 mg in dextrose 5% 100 mL (2mg /mL) infusion Stopped (01/03/21 0340)  . milrinone Stopped (01/03/21 03/05/21)          [id number], Scott Greer [facility] 01/03/2021, 7:46 AM

## 2021-01-03 NOTE — TOC Progression Note (Signed)
Transition of Care (TOC) - Progression Note Heart Failure   Patient Details  Name: Scott Greer MRN: [id number] Date of Birth: 09/30/1956  Transition [facility]) CM/SW Contact  Quade Ramirez, LCSWA Phone Number: [phone] 01/03/2021, 12:13 PM  Clinical Narrative:    CSW followed up with patient regarding his bills including his rent and utilities. CSW confirmed patient has Medicaid [employer] ([id number] N). CSW and patient called DSS together and put them on speaker phone so that [name] and CSW could get in touch with his DSS case worker together to find out about his application for rent and utility assistance however as like last week, DSS administration wouldn't allow CSW to speak with his caseworker Ms. Mickle Mallory [phone]) and they reported they will reach out to [name] within 24/48 hours. CSW explained to DSS administration that they just did this last week and CSW was given the same answer and administration reported that she has no way of accessing [name] caseworker and because [name] is in the hospital protocol is to put a request in for a return call since he cannot come into DSS. CSW spoke with patient about resources and connecting him with the advanced HF outpatient clinic.  CSW will continue to follow for support.        Expected Discharge Plan and Services                                                 Social Determinants of Health (SDOH) Interventions Food Insecurity Interventions: Other (Comment) (Pt has food stamps) Financial Strain Interventions: Other (Comment) (SW will see at [facility] TOC appt) Housing Interventions: Other (Comment) (SW to see at [facility]) Transportation Interventions: Patient Refused,Other (Comment) (pt delcined [facility] at this time.) Alcohol Brief Interventions/Follow-up: Alcohol Education,Brief Advice  Readmission Risk Interventions Readmission Risk Prevention Plan 06/08/2019  Transportation Screening  Complete  PCP or Specialist Appt within 5-7 Days Complete  Home Care Screening Complete  Medication Review (RN CM) Complete  Some recent data might be hidden

## 2021-01-03 NOTE — Progress Notes (Addendum)
Advanced Heart Failure Rounding Note  PCP-Cardiologist: Kristeen Miss, MD   Subjective:    PICC line out over night   Frustrated about being in the hospital. Has a hard time going to the bathroom with IVs. Denies SOB. Worried about his bills.    Objective:   Weight Range: 106.3 kg Body mass index is 29.3 kg/m.   Vital Signs:   Temp:  [97.6 F (36.4 C)-98.4 F (36.9 C)] 98.3 F (36.8 C) (03/01 0308) Pulse Rate:  [80-92] 88 (02/28 2056) Resp:  [11-26] 12 (03/01 0308) BP: (107-179)/(78-129) 116/98 (03/01 0308) SpO2:  [90 %-100 %] 100 % (03/01 0308) Weight:  [106.3 kg] 106.3 kg (03/01 0319) Last BM Date: 01/01/21  Weight change: Filed Weights   01/01/21 0506 01/02/21 0326 01/03/21 0319  Weight: 107.2 kg 105.6 kg 106.3 kg    Intake/Output:   Intake/Output Summary (Last 24 hours) at 01/03/2021 0905 Last data filed at 01/03/2021 0340 Gross per 24 hour  Intake 739.01 ml  Output 1000 ml  Net -260.99 ml      Physical Exam    General:  No resp difficulty HEENT: Normal Neck: Supple. JVP to jaw  . Carotids 2+ bilat; no bruits. No lymphadenopathy or thyromegaly appreciated. Cor: PMI nondisplaced. Regular rate & rhythm. No rubs or murmurs. + S3  Lungs: Clear Abdomen: Soft, nontender, nondistended. No hepatosplenomegaly. No bruits or masses. Good bowel sounds. Extremities: No cyanosis, clubbing, rash, edema Neuro: Alert & orientedx3, cranial nerves grossly intact. moves all 4 extremities w/o difficulty. Affect pleasant   Telemetry   SR and PVCs 90s   EKG    n/a  Labs    CBC Recent Labs    01/02/21 1050 01/03/21 0328  WBC  --  6.3  HGB 12.6* 12.1*  HCT 37.0* 36.3*  MCV  --  82.1  PLT  --  254   Basic Metabolic Panel Recent Labs    37/90/24 0222 01/02/21 1046 01/02/21 1709 01/03/21 0328  NA 141   < > 139 140  K 4.1   < > 4.3 4.3  CL 103  --  100 99  CO2 27  --  28 27  GLUCOSE 127*  --  187* 169*  BUN 32*  --  36* 38*  CREATININE 1.65*  --   1.65* 1.80*  CALCIUM 9.0  --  9.0 9.4  MG 1.9  --   --  2.0   < > = values in this interval not displayed.   Liver Function Tests No results for input(s): AST, ALT, ALKPHOS, BILITOT, PROT, ALBUMIN in the last 72 hours. No results for input(s): LIPASE, AMYLASE in the last 72 hours. Cardiac Enzymes No results for input(s): CKTOTAL, CKMB, CKMBINDEX, TROPONINI in the last 72 hours.  BNP: BNP (last 3 results) Recent Labs    12/25/20 0019  BNP 2,579.0*    ProBNP (last 3 results) No results for input(s): PROBNP in the last 8760 hours.   D-Dimer No results for input(s): DDIMER in the last 72 hours. Hemoglobin A1C No results for input(s): HGBA1C in the last 72 hours. Fasting Lipid Panel No results for input(s): CHOL, HDL, LDLCALC, TRIG, CHOLHDL, LDLDIRECT in the last 72 hours. Thyroid Function Tests No results for input(s): TSH, T4TOTAL, T3FREE, THYROIDAB in the last 72 hours.  Invalid input(s): FREET3  Other results:   Imaging    CARDIAC CATHETERIZATION  Result Date: 01/02/2021  Ost LAD to Prox LAD lesion is 30% stenosed.  STEN DEMATTEO is  a 65 y.o. male  [id number] LOCATION:  FACILITY: [facility] PHYSICIAN: [name], M.D. 04-29-1956 DATE OF PROCEDURE:  01/02/2021 DATE OF DISCHARGE: CARDIAC CATHETERIZATION History obtained from chart review.DIN BOOKWALTER a 64 y.o.malewith type 2 diabetes, hypertension, hyperlipidemia, peripheral osteomyelitis with great toe amputation, tobacco abuse who was admitted on 12/25/2020 for new onset systolic heart failure.  A 2D echo revealed an EF in the 20% range with global hypokinesia.  His troponins were low.  He was referred for right left heart cath to define his anatomy and physiology.   Mr. Dooling has nonobstructive CAD principally in his proximal LAD of about 30%.  He has severe decrease in cardiac output with a cardiac index of 1.3 L/min/m elevated filling pressures.  He has a nonischemic cardiomyopathy that will need guideline directed  optimal medical therapy.  He may need inotropic therapy in the short-term to optimize his hemodynamics.  The radial sheath was removed and a TR band was placed on the right wrist to achieve patent hemostasis.  The patient left lab in stable condition. [name]. MD, [facility] 01/02/2021 11:13 AM   [place] EKG SITE RITE  Result Date: 01/02/2021 If [facility] image not attached, placement could not be confirmed due to current cardiac rhythm.     Medications:     Scheduled Medications: . amiodarone  200 mg Oral BID  . aspirin  81 mg Oral Daily  . atorvastatin  10 mg Oral Daily  . Chlorhexidine Gluconate Cloth  6 each Topical Daily  . digoxin  0.125 mg Oral Daily  . empagliflozin  10 mg Oral Daily  . insulin aspart  0-15 Units Subcutaneous TID WC  . insulin aspart  0-5 Units Subcutaneous QHS  . sacubitril-valsartan  1 tablet Oral BID  . sodium chloride flush  10-40 mL Intracatheter Q12H  . sodium chloride flush  3 mL Intravenous Q12H  . sodium chloride flush  3 mL Intravenous Q12H  . spironolactone  12.5 mg Oral Daily     Infusions: . sodium chloride Stopped (01/03/21 0340)  . sodium chloride Stopped (01/03/21 0340)  . furosemide (LASIX) 200 mg in dextrose 5% 100 mL (2mg /mL) infusion Stopped (01/03/21 0340)  . milrinone Stopped (01/03/21 0339)     PRN Medications:  sodium chloride, sodium chloride, acetaminophen, morphine injection, ondansetron (ZOFRAN) IV, sodium chloride flush, sodium chloride flush, sodium chloride flush, sodium chloride flush     Assessment/Plan   . Acute Biventricular Heart Failure -->Cardiogenic Shock  - ECHO LVEF 25-30% RV severely reduced  - Cath today with elevated filling pressures and low output heart failure. NICM . HIV NR. Check TSH in am. ? HTN/ETOH/PVCs.  - He pulled out PICC line last night. For now we will leave PiCC out and try to use PIV for milrinone and lasix drip.  -Restart milrinone and lasix drip and PIV - Hold bb with low output - Cut  back entresto 24-26 mg twice a day  - Continue jardiance 10 mg daily  - Continue spironolactone 12.5 mg daily  2. CKD Stage IIIb -Creatinine on admit 1.6  -Creatinine 1.6>1.8  -Follow bmet daily.  - Watch for contrast nephropathy  3. PVCs -High burden -Continue  amio 200 mg twice a day .   4. HTN -Not elevated.   5. DMII -Hgb A1C 8.4 2 weeks ago -Continue SSI  6. Tobacco Abuse  7. Social  Consult HFSW Team. He has hard time paying for medications/housing.    Discussed purpose of IV milrinone and lasix.  We discussed that his heart is weak we are trying to help. Given severity of HF life expectancy likely <1 year. Not a candidate for advanced therapies with RV failure and social barriers.   Consult palliative care.   Length of Stay: 8  Amy Clegg, NP  01/03/2021, 9:05 AM  Advanced Heart Failure Team Pager [phone] (M-F; 7a - 4p)  Please contact [facility] for night-coverage after hours (4p -7a ) and weekends on [email or web address]  Patient seen with NP, agree with the above note.   He is frustrated with being hooked up to lines and telemetry.  He pulled out his PICC last night so that he could go to the bathroom. Says nurses are not responding to his calls fast enough.  Milrinone and Lasix no longer running. Creatinine higher at 1.8.   Still short of breath with orthopnea.    General: NAD Neck: JVP 14-16 cm, no thyromegaly or thyroid nodule.  Lungs: Clear to auscultation bilaterally with normal respiratory effort. CV: Lateral PMI.  Heart regular S1/S2, no S3/S4, no murmur.  1+ edema to knees bilaterally.  Abdomen: Soft, nontender, no hepatosplenomegaly, no distention.  Skin: Intact without lesions or rashes.  Neurologic: Alert and oriented x 3.  Psych: Normal affect. Extremities: No clubbing or cyanosis.  HEENT: Normal.   1. Acute systolic CHF: Echo this admission reviewed, EF 20-25% range with severe RV dysfunction.  Nonischemic cardiomyopathy, minimal CAD on  coronary angiography.  HIV negative.  He drinks but not markedly heavily (pint/week liquor).  He apparently had poorly controlled HTN prior to admission (not taking meds for about 9 months). 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  RHC on 2/28 showed elevated filling pressures especially on the right side, also low CI at 1.3. PICC placed to follow CVP and co-ox, co-ox was 38.5% and milrinone 0.25 started.  However, patient pulled out his PICC last night and has no IV access.  He is frustrated about being tethered by lines. Still significantly volume overloaded on exam, creatinine up to 1.8.  Suspect ongoing low output HF.  - For now, will place PIV and run milrinone through this.   - Restart milrinone 0.25 mcg/kg/min.  - Restart Lasix 12 mg/hr, will give him a bedside commode so he does not have to call nurse to go to bathroom.  - Hold Toprol XL with markedly low output and volume overload.  - Decrease Entresto to 24/26 bid for now (rise in creatinine, SBP 100s-110s).   - Continue spironolactone and Jardiance.  - Continue digoxin 0.125 daily.  - Needs full diuresis then try to wean off milrinone.  Given social situation and ?ETOH abuse, he would not be a good candidate for advanced therapies at this time.  2. CKD: Stage 3a.  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  Follow closely with diuresis => creatinine up to 1.8 today but off milrinone.  3. HTN: BP currently lower, SBP 100s-110s.  4. PVCs: There appears to be a high burden by telemetry, may be worse with addition of milrinone.  Would avoid high dose Toprol XL for now with low output/volume overload.  - Added amiodarone 200 mg bid to try to control PVCs.   Marca Ancona 01/03/2021 12:39 PM

## 2021-01-03 NOTE — Consult Note (Signed)
Consultation Note Date: 01/03/2021   Patient Name: Scott Greer  DOB: Dec 04, 1955  MRN: [id number]  Age / Sex: 65 y.o., male  PCP: [name], MD Referring Physician: Aline August, MD  Reason for Consultation: Establishing goals of care and Psychosocial/spiritual support  HPI/Patient Profile: 65 y.o. 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  Pt not able to afford / complaint with DM or HTN meds for many months.  Recently seen in clinic earlier this month, noted to have Creat of 1.5, also noted to have macro albuminuria.     Pt presents to the ED with c/o BLE and scrotum swelling.  Symptoms worsened over the past 2-3 weeks.  He went to PCP 1.5 weeks ago, got refills on meds but never ended up picking these up, so he has been off of his meds for the past 8 months or so.      Very poor social support system. Dense psycho-social issues  Patient faces treatment option decisions , advanced directive decisions and anticipatory care needs.    Clinical Assessment and Goals of Care:   This NP Wadie Lessen reviewed medical records, received report from team, assessed the patient and then meet at the patient's bedside  to discuss diagnosis, prognosis, GOC, EOL wishes disposition and options.   Concept of Palliative Care was introduced as specialized medical care for people and their families living with serious illness.  If focuses on providing relief from the symptoms and stress of a serious illness.  The goal is to improve quality of life for both the patient and the family.  Values and goals of care important to patient and family were attempted to be elicited.  Created space and opportunity for patient  to explore thoughts and feelings regarding current medical situation. Patient  speaks to his difficult social situation. He tells me he was incarcerated for 20 years and only released 3 years ago. It has been hard for him to enter back into society, however he currently has an apartment and is caring for himself. Today his main worry and concern is making sure his rent and utilities are paid.    A  discussion was had today regarding advanced directives.  Concepts specific to code status, artifical feeding and hydration, continued IV antibiotics and rehospitalization was had.     Patient verbalizes desire for all offered and available medical interventions to prolong life.    Questions and concerns addressed.  Patient  encouraged to call with questions or concerns.     PMT will continue to support holistically.      Spoke to [employer], she has not spoke to her bother "for many months, they had a falling out"     Unwilling to get involved       No documented HPOA or ACP documents.  Poor social support      SUMMARY OF RECOMMENDATIONS  Code Status/Advance Care Planning:  Full code   Palliative Prophylaxis:   Bowel Regimen, Delirium Protocol, Frequent Pain Assessment and Oral Care  Additional Recommendations (Limitations, Scope, Preferences):  Full Scope Treatment  Psycho-social/Spiritual:   Desire for further Chaplaincy support:no-declined  Additional Recommendations: Education on Hospice  Prognosis:   Unable to determine  Discharge Planning: To Be Determined      Primary Diagnoses: Present on Admission:  Positive for macroalbuminuria  Essential hypertension  CKD (chronic kidney disease) stage 3, GFR 30-59 ml/min (HCC)  Acute systolic CHF (congestive heart failure) ([place])   I have reviewed the medical record, interviewed the patient and family, and examined the patient. The following aspects are pertinent.  Past Medical History:  Diagnosis Date   Dehiscence of amputation stump ([place])    left great toe   Diabetes  mellitus without complication ([place]) 6195   Type II   Glaucoma 2015   Hyperlipidemia    Hypertension 2005   Osteomyelitis ([place])    Wears glasses    Social History   Socioeconomic History   Marital status: Legally Separated    Spouse name: Tawana   Number of children: 2   Years of education: Not on file   Highest education level: Associate degree: occupational, Hotel manager, or vocational program  Occupational History   Not on file  Tobacco Use   Smoking status: Current Every Day Smoker    Types: Cigars, Cigarettes   Smokeless tobacco: Former Systems developer    Types: Chew   Tobacco comment: 6 daily  Vaping Use   Vaping Use: Never used  Substance and Sexual Activity   Alcohol use: Yes    Alcohol/week: 11.0 standard drinks    Types: 3 Cans of beer, 8 Shots of liquor per week    Comment: occasional   Drug use: Yes    Types: Marijuana    Comment: ocassional - last time 05/08/2020   Sexual activity: Not Currently  Other Topics Concern   Not on file  Social History Narrative   Out of prison for 2 months.   Lives at home with his wife.   Social Determinants of Health   Financial Resource Strain: High Risk   Difficulty of Paying Living Expenses: Very hard  Food Insecurity: Landscape architect Present   Worried About Charity fundraiser in the Last Year: Sometimes true   Arboriculturist in the Last Year: Sometimes true  Transportation Needs: Public librarian (Medical): Yes   Lack of Transportation (Non-Medical): Yes  Physical Activity: Inactive   Days of Exercise per Week: 0 days   Minutes of Exercise per Session: 0 min  Stress: Not on file  Social Connections: Not on file   Family History  Problem Relation Age of Onset   Stroke Mother    Diabetes Mother        Toward end of life   Scheduled Meds:  amiodarone  200 mg Oral BID   aspirin  81 mg Oral Daily   atorvastatin  10 mg Oral Daily   Chlorhexidine Gluconate  Cloth  6 each Topical Daily   digoxin  0.125 mg Oral Daily   empagliflozin  10 mg Oral Daily   insulin aspart  0-15 Units Subcutaneous TID WC   insulin aspart  0-5 Units Subcutaneous QHS   sacubitril-valsartan  1 tablet Oral BID   sodium chloride flush  10-40 mL Intracatheter Q12H   sodium chloride flush  3 mL Intravenous Q12H  sodium chloride flush  3 mL Intravenous Q12H   spironolactone  12.5 mg Oral Daily   Continuous Infusions:  sodium chloride Stopped (01/03/21 0340)   sodium chloride Stopped (01/03/21 0340)   furosemide (LASIX) 200 mg in dextrose 5% 100 mL (10m/mL) infusion 12 mg/hr (01/03/21 1537)   milrinone 0.25 mcg/kg/min (01/03/21 1205)   PRN Meds:.sodium chloride, sodium chloride, acetaminophen, morphine injection, ondansetron (ZOFRAN) IV, sodium chloride flush, sodium chloride flush, sodium chloride flush, sodium chloride flush Medications Prior to Admission:  Prior to Admission medications   Medication Sig Start Date End Date Taking? Authorizing Provider  atorvastatin (LIPITOR) 10 MG tablet Take 1 tablet (10 mg total) by mouth daily. 12/14/20  Yes [name], Cari S, PA-C  Blood Glucose Monitoring Suppl (TRUE METRIX METER) w/Device KIT Use as directed 10/23/19  Yes JLadell Pier MD  glucose blood (TRUE METRIX BLOOD GLUCOSE TEST) test strip Use as instructed 10/23/19  Yes JLadell Pier MD  lisinopril (ZESTRIL) 10 MG tablet Take 1 tablet (10 mg total) by mouth daily. 12/14/20  Yes [name], Cari S, PA-C  metFORMIN (GLUCOPHAGE) 500 MG tablet Take 1 tablet (500 mg total) by mouth 2 (two) times daily with a meal. 12/14/20  Yes [name], Cari S, PA-C  TRUEplus Lancets 28G MISC Use as directed 10/23/19  Yes JLadell Pier MD  Vitamin D, Ergocalciferol, (DRISDOL) 1.25 MG (50000 UNIT) CAPS capsule Take 1 capsule (50,000 Units total) by mouth every 7 (seven) days. 12/16/20  Yes [name], Cari S, PA-C   Allergies  Allergen Reactions   Bee Venom Hives, Itching and  Swelling   Review of Systems  Cardiovascular: Positive for leg swelling.  Neurological: Positive for weakness.    Physical Exam Cardiovascular:     Rate and Rhythm: Normal rate.  Musculoskeletal:     Comments: - noted amputations toes from  both feet  Skin:    General: Skin is warm and dry.  Neurological:     Mental Status: He is alert.     Vital Signs: BP (!) 115/52 (BP Location: Right Leg)    Pulse 77    Temp (!) 97.4 F (36.3 C) (Oral)    Resp 18    Ht 6' 3"  (1.905 m)    Wt 106.3 kg    SpO2 100%    BMI 29.30 kg/m  Pain Scale: 0-10 POSS *See Group Information*: 1-Acceptable,Awake and alert Pain Score: 0-No pain   SpO2: SpO2: 100 % O2 Device:SpO2: 100 % O2 Flow Rate: .O2 Flow Rate (L/min): 2 L/min  IO: Intake/output summary:   Intake/Output Summary (Last 24 hours) at 01/03/2021 1631 Last data filed at 01/03/2021 1600 Gross per 24 hour  Intake 1589.01 ml  Output 1175 ml  Net 414.01 ml    LBM: Last BM Date: 01/01/21 Baseline Weight: Weight: 114.3 kg Most recent weight: Weight: 106.3 kg     Palliative Assessment/Data:  60%   Discussed with Cyrus/  LCSW  Time In: 1500 Time Out: 1610 Time Total: 70  Greater than 50%  of this time was spent counseling and coordinating care related to the above assessment and plan.  Signed by: MWadie Lessen NP   Please contact Palliative Medicine Team phone at [phone] questions and concerns.  For individual provider: See AShea Evans

## 2021-01-04 ENCOUNTER — Inpatient Hospital Stay (HOSPITAL_COMMUNITY): Payer: Medicaid Other

## 2021-01-04 DIAGNOSIS — R809 Proteinuria, unspecified: Secondary | ICD-10-CM | POA: Diagnosis not present

## 2021-01-04 DIAGNOSIS — I10 Essential (primary) hypertension: Secondary | ICD-10-CM | POA: Diagnosis not present

## 2021-01-04 DIAGNOSIS — Z9114 Patient's other noncompliance with medication regimen: Secondary | ICD-10-CM | POA: Diagnosis not present

## 2021-01-04 DIAGNOSIS — I509 Heart failure, unspecified: Secondary | ICD-10-CM | POA: Diagnosis not present

## 2021-01-04 DIAGNOSIS — E1121 Type 2 diabetes mellitus with diabetic nephropathy: Secondary | ICD-10-CM | POA: Diagnosis not present

## 2021-01-04 DIAGNOSIS — I214 Non-ST elevation (NSTEMI) myocardial infarction: Secondary | ICD-10-CM | POA: Diagnosis not present

## 2021-01-04 DIAGNOSIS — I5021 Acute systolic (congestive) heart failure: Secondary | ICD-10-CM | POA: Diagnosis not present

## 2021-01-04 DIAGNOSIS — Z515 Encounter for palliative care: Secondary | ICD-10-CM | POA: Diagnosis not present

## 2021-01-04 DIAGNOSIS — N1831 Chronic kidney disease, stage 3a: Secondary | ICD-10-CM | POA: Diagnosis not present

## 2021-01-04 DIAGNOSIS — R609 Edema, unspecified: Secondary | ICD-10-CM | POA: Diagnosis not present

## 2021-01-04 LAB — BASIC METABOLIC PANEL
Anion gap: 10 (ref 5–15)
Anion gap: 14 (ref 5–15)
BUN: 37 mg/dL — ABNORMAL HIGH (ref 8–23)
BUN: 33 mg/dL — ABNORMAL HIGH (ref 8–23)
CO2: 29 mmol/L (ref 22–32)
CO2: 29 mmol/L (ref 22–32)
Calcium: 8.9 mg/dL (ref 8.9–10.3)
Calcium: 9.4 mg/dL (ref 8.9–10.3)
Chloride: 99 mmol/L (ref 98–111)
Chloride: 97 mmol/L — ABNORMAL LOW (ref 98–111)
Creatinine, Ser: 1.71 mg/dL — ABNORMAL HIGH (ref 0.61–1.24)
Creatinine, Ser: 1.64 mg/dL — ABNORMAL HIGH (ref 0.61–1.24)
GFR, Estimated: 45 mL/min — ABNORMAL LOW (ref >60)
GFR, Estimated: 47 mL/min — ABNORMAL LOW (ref >60)
Glucose, Bld: 197 mg/dL — ABNORMAL HIGH (ref 70–99)
Glucose, Bld: 107 mg/dL — ABNORMAL HIGH (ref 70–99)
Potassium: 3.8 mmol/L (ref 3.5–5.1)
Potassium: 3.8 mmol/L (ref 3.5–5.1)
Sodium: 138 mmol/L (ref 135–145)
Sodium: 140 mmol/L (ref 135–145)

## 2021-01-04 LAB — CBC WITH DIFFERENTIAL/PLATELET
Abs Immature Granulocytes: 0.01 10*3/uL (ref 0.00–0.07)
Basophils Absolute: 0 10*3/uL (ref 0.0–0.1)
Basophils Relative: 0 %
Eosinophils Absolute: 0.1 10*3/uL (ref 0.0–0.5)
Eosinophils Relative: 2 %
HCT: 32.8 % — ABNORMAL LOW (ref 39.0–52.0)
Hemoglobin: 11.3 g/dL — ABNORMAL LOW (ref 13.0–17.0)
Immature Granulocytes: 0 %
Lymphocytes Relative: 15 %
Lymphs Abs: 0.9 10*3/uL (ref 0.7–4.0)
MCH: 28 pg (ref 26.0–34.0)
MCHC: 34.5 g/dL (ref 30.0–36.0)
MCV: 81.2 fL (ref 80.0–100.0)
Monocytes Absolute: 0.8 10*3/uL (ref 0.1–1.0)
Monocytes Relative: 14 %
Neutro Abs: 3.9 10*3/uL (ref 1.7–7.7)
Neutrophils Relative %: 69 %
Platelets: 216 10*3/uL (ref 150–400)
RBC: 4.04 MIL/uL — ABNORMAL LOW (ref 4.22–5.81)
RDW: 16.5 % — ABNORMAL HIGH (ref 11.5–15.5)
WBC: 5.7 10*3/uL (ref 4.0–10.5)
nRBC: 0 % (ref 0.0–0.2)

## 2021-01-04 LAB — MAGNESIUM: Magnesium: 2.1 mg/dL (ref 1.7–2.4)

## 2021-01-04 LAB — GLUCOSE, CAPILLARY
Glucose-Capillary: 163 mg/dL — ABNORMAL HIGH (ref 70–99)
Glucose-Capillary: 154 mg/dL — ABNORMAL HIGH (ref 70–99)
Glucose-Capillary: 113 mg/dL — ABNORMAL HIGH (ref 70–99)
Glucose-Capillary: 196 mg/dL — ABNORMAL HIGH (ref 70–99)

## 2021-01-04 MED ORDER — SPIRONOLACTONE 12.5 MG HALF TABLET
12.5000 mg | ORAL_TABLET | Freq: Once | ORAL | Status: AC
  Administered 2021-01-04: 12.5 mg via ORAL
  Filled 2021-01-04: qty 1

## 2021-01-04 MED ORDER — GADOBUTROL 1 MMOL/ML IV SOLN
12.0000 mL | Freq: Once | INTRAVENOUS | Status: AC | PRN
  Administered 2021-01-04: 12 mL via INTRAVENOUS

## 2021-01-04 MED ORDER — POTASSIUM CHLORIDE CRYS ER 20 MEQ PO TBCR
40.0000 meq | EXTENDED_RELEASE_TABLET | Freq: Once | ORAL | Status: AC
  Administered 2021-01-04: 40 meq via ORAL
  Filled 2021-01-04: qty 2

## 2021-01-04 MED ORDER — SPIRONOLACTONE 25 MG PO TABS
25.0000 mg | ORAL_TABLET | Freq: Every day | ORAL | Status: DC
Start: 2021-01-05 — End: 2021-01-05
  Administered 2021-01-05: 25 mg via ORAL
  Filled 2021-01-04: qty 1

## 2021-01-04 MED ORDER — POTASSIUM CHLORIDE CRYS ER 20 MEQ PO TBCR
30.0000 meq | EXTENDED_RELEASE_TABLET | ORAL | Status: AC
  Administered 2021-01-04 – 2021-01-05 (×2): 30 meq via ORAL
  Filled 2021-01-04 (×2): qty 1

## 2021-01-04 NOTE — Progress Notes (Signed)
PROGRESS NOTE    Scott Greer  CBJ:[id number] DOB: 1956-04-10 DOA: 12/25/2020 PCP: [name], MD   Brief Narrative:  HPI on 12/26/2020 by Dr. Lyda Perone Scott Greer is a 65 y.o. male with medical history significant of DM2, HTN, L great toe amputation due to osteomyelitis.  Pt not able to afford / complaint with DM or HTN meds for many months.  Recently seen in clinic earlier this month, noted to have Creat of 1.5, also noted to have macro albuminuria.  Pt presents to the ED with c/o BLE and scrotum swelling.  These symptoms onset and worsened over the past 2-3 weeks.  Now severe.  Also has DOE over the past 3 days.  Symptoms constant.  He went to PCP 1.5 weeks ago, got refills on meds but never ended up picking these up, so he has been off of his meds for the past 8 months or so.  No CP.  No abd pain.  Interim history Patient admitted with bilateral lower extremity edema along with scrotal edema and dyspnea on exertion.  Admitted with CHF exacerbation and found to have a BNP of 2579 on admission.  Was started on IV Lasix.  Patient with an EF of 25 to 30% on echocardiogram, and underwent cardiac catheterization on 12/2820.  Currently on milrinone and Lasix drips.  CHF team following. Assessment & Plan   Acute on chronic combined systolic and diastolic heart failure -Patient admitted with dyspnea on exertion along with lower extremity swelling and edema of the scrotum.  BNP was 2579 on admission.  Chest x-ray showed mild right basilar atelectasis. -Cardiology consulted and appreciated -Echocardiogram showed an EF of 25 to 30% with left ventricular global hypokinesis and severely decreased function. -Placed on IV Lasix and then transition to Lasix and milrinone drips -Heart failure team currently following -Continue spironolactone, Toprol, Entresto -Status post cardiac catheterization on 2/28 which showed nonobstructive CAD -Monitor intake and output, daily  weights  Diabetes mellitus, type II with hyperglycemia -Hemoglobin A1c 8.4 on 12/14/20 -Metformin currently held -Continue insulin sliding scale and CBG monitoring  Chronic kidney disease, stage IIIa -Creatinine currently 1.71 (GFR reviewed since admission, has been greater than 45, therefore falls into stage IIIa CKD) -Continue to monitor BMP  Essential hypertension -Patient has been nonadherent to medications -Continue Entresto, spironolactone, metoprolol, Lasix drip -BP stable  Microalbuminuria -Previous hospitalist discussed with Dr. Malen Gauze, nephrology on 12/29/20.  24-hour urine collection showing urine protein of 2747 -Patient to follow-up with nephrology as an outpatient   PVC -Cardiology added amiodarone to control PVCs  DVT Prophylaxis Lovenox  Code Status: Full  Family Communication: None at bedside  Disposition Plan:  Status is: Inpatient  Remains inpatient appropriate because:IV treatments appropriate due to intensity of illness or inability to take PO   Dispo: The patient is from: Home              Anticipated d/c is to: Home              Patient currently is not medically stable to d/c.   Difficult to place patient No   Consultants Cardiology/CHF team Nephrology, via phone  Procedures  Echocardiogram Cardiac catheterization  Antibiotics   Anti-infectives (From admission, onward)   None      Subjective:   [name] seen and examined today.  Patient would like to go home.  Feels that his breathing has improved.  Feels that his swelling has also improved.  Denies current chest pain  or shortness of breath, abdominal pain, nausea or vomiting, diarrhea or constipation, dizziness or headache.  Objective:   Vitals:   01/03/21 2300 01/04/21 0000 01/04/21 0347 01/04/21 0720  BP: (!) 144/79 116/60 126/90   Pulse:   90   Resp: 16 15 17    Temp:  97.9 F (36.6 C) 98.1 F (36.7 C) 98.1 F (36.7 C)  TempSrc:  Oral Oral Oral  SpO2:  100% 100%    Weight:   101.3 kg   Height:        Intake/Output Summary (Last 24 hours) at 01/04/2021 1214 Last data filed at 01/04/2021 1100 Gross per 24 hour  Intake 1475.47 ml  Output 8800 ml  Net -7324.53 ml   Filed Weights   01/02/21 0326 01/03/21 0319 01/04/21 0347  Weight: 105.6 kg 106.3 kg 101.3 kg    Exam  General: Well developed, chronically ill-appearing, NAD  HEENT: NCAT, mucous membranes moist.   Cardiovascular: S1 S2 auscultated, no murmurs, RRR  Respiratory: Diminished breath sounds  Abdomen: Soft, nontender, nondistended, + bowel sounds  Extremities: 3+ LE edema bilaterally  Neuro: AAOx3, nonfocal  Psych: appropriate mood and affect, pleasant   Data Reviewed: I have personally reviewed following labs and imaging studies  CBC: Recent Labs  Lab 01/02/21 1046 01/02/21 1050 01/03/21 0328 01/04/21 0336  WBC  --   --  6.3 5.7  NEUTROABS  --   --   --  3.9  HGB 12.9*  12.6* 12.6* 12.1* 11.3*  HCT 38.0*  37.0* 37.0* 36.3* 32.8*  MCV  --   --  82.1 81.2  PLT  --   --  254 216   Basic Metabolic Panel: Recent Labs  Lab 12/31/20 0311 01/01/21 0419 01/02/21 0222 01/02/21 1046 01/02/21 1050 01/02/21 1709 01/03/21 0328 01/04/21 0336  NA 139 141 141 141  140 140 139 140 138  K 4.1 3.8 4.1 4.3  4.3 4.2 4.3 4.3 3.8  CL 103 101 103  --   --  100 99 99  CO2 27 31 27   --   --  28 27 29   GLUCOSE 161* 160* 127*  --   --  187* 169* 197*  BUN 29* 28* 32*  --   --  36* 38* 37*  CREATININE 1.45* 1.51* 1.65*  --   --  1.65* 1.80* 1.71*  CALCIUM 8.7* 8.9 9.0  --   --  9.0 9.4 8.9  MG 2.0 1.9 1.9  --   --   --  2.0 2.1   GFR: Estimated Creatinine Clearance: 53.5 mL/min (A) (by C-G formula based on SCr of 1.71 mg/dL (H)). Liver Function Tests: No results for input(s): AST, ALT, ALKPHOS, BILITOT, PROT, ALBUMIN in the last 168 hours. No results for input(s): LIPASE, AMYLASE in the last 168 hours. No results for input(s): AMMONIA in the last 168 hours. Coagulation  Profile: No results for input(s): INR, PROTIME in the last 168 hours. Cardiac Enzymes: No results for input(s): CKTOTAL, CKMB, CKMBINDEX, TROPONINI in the last 168 hours. BNP (last 3 results) No results for input(s): PROBNP in the last 8760 hours. HbA1C: No results for input(s): HGBA1C in the last 72 hours. CBG: Recent Labs  Lab 01/03/21 1140 01/03/21 1616 01/03/21 2131 01/04/21 0600 01/04/21 1145  GLUCAP 126* 214* 150* 163* 154*   Lipid Profile: No results for input(s): CHOL, HDL, LDLCALC, TRIG, CHOLHDL, LDLDIRECT in the last 72 hours. Thyroid Function Tests: No results for input(s): TSH, T4TOTAL, FREET4, T3FREE, THYROIDAB in the  last 72 hours. Anemia Panel: No results for input(s): VITAMINB12, FOLATE, FERRITIN, TIBC, IRON, RETICCTPCT in the last 72 hours. Urine analysis:    Component Value Date/Time   COLORURINE STRAW (A) 12/26/2020 1000   APPEARANCEUR CLEAR 12/26/2020 1000   LABSPEC 1.005 12/26/2020 1000   PHURINE 5.0 12/26/2020 1000   GLUCOSEU NEGATIVE 12/26/2020 1000   HGBUR NEGATIVE 12/26/2020 1000   BILIRUBINUR NEGATIVE 12/26/2020 1000   KETONESUR NEGATIVE 12/26/2020 1000   PROTEINUR 30 (A) 12/26/2020 1000   NITRITE NEGATIVE 12/26/2020 1000   LEUKOCYTESUR NEGATIVE 12/26/2020 1000   Sepsis Labs: @LABRCNTIP (procalcitonin:4,lacticidven:4)  ) Recent Results (from the past 240 hour(s))  SARS CORONAVIRUS 2 (TAT 6-24 HRS) Nasopharyngeal Nasopharyngeal Swab     Status: None   Collection Time: 12/26/20  2:09 AM   Specimen: Nasopharyngeal Swab  Result Value Ref Range Status   SARS Coronavirus 2 NEGATIVE NEGATIVE Final    Comment: (NOTE) SARS-CoV-2 target nucleic acids are NOT DETECTED.  The SARS-CoV-2 RNA is generally detectable in upper and lower respiratory specimens during the acute phase of infection. Negative results do not preclude SARS-CoV-2 infection, do not rule out co-infections with other pathogens, and should not be used as the sole basis for treatment  or other patient management decisions. Negative results must be combined with clinical observations, patient history, and epidemiological information. The expected result is Negative.  Fact Sheet for Patients: 12/28/20  Fact Sheet for Healthcare Providers: HairSlick.no  This test is not yet approved or cleared by the quierodirigir.com FDA and  has been authorized for detection and/or diagnosis of SARS-CoV-2 by FDA under an Emergency Use Authorization (EUA). This EUA will remain  in effect (meaning this test can be used) for the duration of the COVID-19 declaration under Se ction 564(b)(1) of the Act, 21 U.S.C. section 360bbb-3(b)(1), unless the authorization is terminated or revoked sooner.  Performed at [facility], [address] [place], [place]       Radiology Studies: No results found.   Scheduled Meds: . amiodarone  200 mg Oral BID  . aspirin  81 mg Oral Daily  . atorvastatin  10 mg Oral Daily  . Chlorhexidine Gluconate Cloth  6 each Topical Daily  . digoxin  0.125 mg Oral Daily  . empagliflozin  10 mg Oral Daily  . insulin aspart  0-15 Units Subcutaneous TID WC  . insulin aspart  0-5 Units Subcutaneous QHS  . sacubitril-valsartan  1 tablet Oral BID  . sodium chloride flush  3 mL Intravenous Q12H  . [START ON 01/05/2021] spironolactone  25 mg Oral Daily   Continuous Infusions: . sodium chloride Stopped (01/03/21 0340)  . furosemide (LASIX) 200 mg in dextrose 5% 100 mL (2mg /mL) infusion 12 mg/hr (01/04/21 0659)  . milrinone 0.25 mcg/kg/min (01/04/21 0659)     LOS: 9 days   Time Spent in minutes   45 minutes  Ramil Edgington D.O. on 01/04/2021 at 12:14 PM  Between 7am to 7pm - Please see pager noted on [email or web address]  After 7pm go to [email or web address]  And look for the night coverage person covering for me after hours  [facility] Office  [phone]

## 2021-01-04 NOTE — Progress Notes (Signed)
patient is currently eating and Lab will come back per patient request.

## 2021-01-04 NOTE — Progress Notes (Signed)
Patient ID: DIVANTE KOTCH, male   DOB: [birth year], 65 y.o.   MRN: [id number]   This NP visited patient at the bedside as a follow up to  yesterday's GOCs meeting.  Patient main concern continues to to be social issues related to housing.  I assisted patient to call apartment manager/Wrenn Zealy.  He is told that his rent is pain through April 1st.  This gives him some sense of relief.  Information given to social work.  Continued education regarding his current medical situation specific to CHF, CKD, diabetes mellitus, peripheral vascular disease and hypertension.  Education offered on the importance of compliance with his medications, diet and exercise as it relates to positive outcomes in his healthcare.  Recommendation for outpatient community-based palliative services on discharge.  MOST form introduced, hopefully it can be completed with the assistance of community-based palliative.  Discussed with patient the importance of continued conversation with his  medical providers regarding overall plan of care and treatment options,  ensuring decisions are within the context of the patients values and GOCs.  Questions and concerns addressed   Discussed with Dr Catha Gosselin.  Will likely discharge home today.  Total time spent on the unit was 35 minutes  Greater than 50% of the time was spent in counseling and coordination of care  Lorinda Creed NP  Palliative Medicine Team Team Phone # [phone] Pager [phone]

## 2021-01-04 NOTE — Progress Notes (Signed)
Patient in cardiac MRI With [facility] Nurse [place] Rn

## 2021-01-04 NOTE — Telephone Encounter (Signed)
Advanced Heart Failure Patient Advocate Encounter  Prior Authorization for London Pepper has been approved.    PA# [id number] Effective dates: 01/02/21 through until further notice  Advanced Heart Failure Patient Advocate Encounter  Prior Authorization for Sherryll Burger has been approved.    PA# [id number] Effective dates: 01/03/21 through until further notice.  Archer Asa, CPhT

## 2021-01-04 NOTE — Progress Notes (Signed)
Orthopedic Tech Progress Note Patient Details:  Scott Greer [birth year] [id number]  Ortho Devices Type of Ortho Device: Roland Rack boot Ortho Device/Splint Location: BLE Ortho Device/Splint Interventions: Ordered,Application   Post Interventions Patient Tolerated: Well Instructions Provided: Care of device   [name] 01/04/2021, 4:22 PM

## 2021-01-04 NOTE — Progress Notes (Addendum)
Advanced Heart Failure Rounding Note  PCP-Cardiologist: Kristeen Miss, MD   Subjective:    -2/28 night patient removed his PICC  Brisk diuresis overnight on peripheral milrinone 0.25 and IV lasix gtt 12mg .  He had 6.1L output yesterday, renal function stable bp excellent.  Still very insistent on wanting to get out of here, he may stay for one more day.     Objective:   Weight Range: 101.3 kg Body mass index is 27.92 kg/m.   Vital Signs:   Temp:  [97.4 F (36.3 C)-98.1 F (36.7 C)] 98.1 F (36.7 C) (03/02 0720) Pulse Rate:  [77-90] 90 (03/02 0347) Resp:  [15-23] 17 (03/02 0347) BP: (105-144)/(52-90) 126/90 (03/02 0347) SpO2:  [100 %] 100 % (03/02 0347) Weight:  [101.3 kg] 101.3 kg (03/02 0347) Last BM Date: 01/03/21  Weight change: Filed Weights   01/02/21 0326 01/03/21 0319 01/04/21 0347  Weight: 105.6 kg 106.3 kg 101.3 kg    Intake/Output:   Intake/Output Summary (Last 24 hours) at 01/04/2021 0744 Last data filed at 01/04/2021 0718 Gross per 24 hour  Intake 1605.47 ml  Output 7100 ml  Net -5494.53 ml      Physical Exam    Cardiac: JVD 12, normal rate and rhythm, clear s1 and s2, no murmurs, rubs or gallops, 3+ LE edema bilaterally Pulmonary: decreased breath sounds right base, mild rales left base, not in distress Abdominal: non distended abdomen, soft and nontender Psych: Alert, conversant, in good spirits    Telemetry   SR and PVCs 90s   EKG    n/a  Labs    CBC Recent Labs    01/03/21 0328 01/04/21 0336  WBC 6.3 5.7  NEUTROABS  --  3.9  HGB 12.1* 11.3*  HCT 36.3* 32.8*  MCV 82.1 81.2  PLT 254 216   Basic Metabolic Panel Recent Labs    03/06/21 0328 01/04/21 0336  NA 140 138  K 4.3 3.8  CL 99 99  CO2 27 29  GLUCOSE 169* 197*  BUN 38* 37*  CREATININE 1.80* 1.71*  CALCIUM 9.4 8.9  MG 2.0 2.1   Liver Function Tests No results for input(s): AST, ALT, ALKPHOS, BILITOT, PROT, ALBUMIN in the last 72 hours. No results for  input(s): LIPASE, AMYLASE in the last 72 hours. Cardiac Enzymes No results for input(s): CKTOTAL, CKMB, CKMBINDEX, TROPONINI in the last 72 hours.  BNP: BNP (last 3 results) Recent Labs    12/25/20 0019  BNP 2,579.0*    ProBNP (last 3 results) No results for input(s): PROBNP in the last 8760 hours.   D-Dimer No results for input(s): DDIMER in the last 72 hours. Hemoglobin A1C No results for input(s): HGBA1C in the last 72 hours. Fasting Lipid Panel No results for input(s): CHOL, HDL, LDLCALC, TRIG, CHOLHDL, LDLDIRECT in the last 72 hours. Thyroid Function Tests No results for input(s): TSH, T4TOTAL, T3FREE, THYROIDAB in the last 72 hours.  Invalid input(s): FREET3  Other results:   Imaging    No results found.   Medications:     Scheduled Medications: . amiodarone  200 mg Oral BID  . aspirin  81 mg Oral Daily  . atorvastatin  10 mg Oral Daily  . Chlorhexidine Gluconate Cloth  6 each Topical Daily  . digoxin  0.125 mg Oral Daily  . empagliflozin  10 mg Oral Daily  . insulin aspart  0-15 Units Subcutaneous TID WC  . insulin aspart  0-5 Units Subcutaneous QHS  . sacubitril-valsartan  1  tablet Oral BID  . sodium chloride flush  10-40 mL Intracatheter Q12H  . sodium chloride flush  3 mL Intravenous Q12H  . sodium chloride flush  3 mL Intravenous Q12H  . spironolactone  12.5 mg Oral Daily    Infusions: . sodium chloride Stopped (01/03/21 0340)  . sodium chloride Stopped (01/03/21 0340)  . furosemide (LASIX) 200 mg in dextrose 5% 100 mL (2mg /mL) infusion 12 mg/hr (01/04/21 0659)  . milrinone 0.25 mcg/kg/min (01/04/21 0659)    PRN Medications: sodium chloride, sodium chloride, acetaminophen, morphine injection, ondansetron (ZOFRAN) IV, sodium chloride flush, sodium chloride flush, sodium chloride flush, sodium chloride flush     Assessment/Plan   . 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 NICM . HIV NR.  ? HTN/ETOH/PVCs.  - He pulled out PICC line 2/28. For now we will leave PiCC out and try to use PIV for milrinone and lasix drip.  -Still markedly overloaded wanting to leave soon, renal function improved so will continue with aggressive diuresis using milrinone and lasix drip using PIV  - Hold bb with low output - continue entresto 24-26 mg twice a day may be able to go back up tomorrow, renal function improving - Continue jardiance 10 mg daily  - Continue spironolactone 12.5 mg daily  2. CKD Stage IIIb -Creatinine on admit 1.6  -Creatinine 1.6>1.8>1.7 -Follow bmet daily.  - Watch for contrast nephropathy  3. PVCs -High burden -Continue amio 200 mg twice a day .   4. HTN -may be able to go back up on entresto tomorrow, for now continue at 24/26, spiro 12.5, jardiance  5. DMII -Hgb A1C 8.4 2 weeks ago -Continue SSI  6. Tobacco Abuse  7. Social  Consult HFSW Team. He has hard time paying for medications/housing.   Palliative care saw him yesterday note pending  Length of Stay: 9  3/28, MD  01/04/2021, 7:44 AM   Advanced Heart Failure Team Pager [phone] (M-F; 7a - 4p)  Please contact [facility] for night-coverage after hours (4p -7a ) and weekends on [email or web address]  Patient seen with Dr. [phone], agree with the above note.   He diuresed very well yesterday, weight down 11 lbs.  Creatinine stable at 1.7.  Breathing better but still very swollen.   General: NAD Neck: JVP 14 cm, no thyromegaly or thyroid nodule.  Lungs: Clear to auscultation bilaterally with normal respiratory effort. CV: Lateral PMI.  Heart regular S1/S2, no S3/S4, no murmur.  2+ edema to the knees.   Abdomen: Soft, nontender, no hepatosplenomegaly, no distention.  Skin: Intact without lesions or rashes.  Neurologic: Alert and oriented x 3.  Psych: Normal affect. Extremities: No clubbing or cyanosis.  HEENT:  Normal.   1. Acute systolic CHF: Echo this admission reviewed, EF 20-25% range with severe RV dysfunction. 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 HIV negative. He drinks but not markedly heavily (pint/week liquor). He apparently had poorly controlled HTN prior to admission (not taking meds for about 9 months). 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 RHC on 2/28 showed elevated filling pressures especially on the right side, also low CI at 1.3. PICC placed to follow CVP and co-ox, co-ox was 38.5% and milrinone 0.25 started.  However, patient pulled out his PICC and now has PIV. Still  significantly volume overloaded on exam, creatinine stable at 1.7.  Good diuresis yesterday, weight coming down.  - He has refused replacement of PICC, continue milrinone 0.25 via PIV.   - Continue Lasix 12 mg/hr with good diuresis.  - Hold Toprol XL with markedly low output and volume overload.  - Continue Entresto 24/26 bid.  - Continue Jardiance.  - Increase spironolactone 25 daily.  - Suspect he would not be compliant with Bidil at home.  - Continue digoxin 0.125 daily.  - Cardiac MRI to assess for infiltrative disease.  - Needs full diuresis then try to wean off milrinone. Given social situation and ?ETOH abuse, he would not be a good candidate for advanced therapies at this time.  - Unna boots 2. CKD: Stage 3a. 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 Creatinine stable 1.7.  3. HTN: BP mildly elevated, increasing spironolactone.  4. PVCs: There appears to be a high burden by telemetry, may be worse with addition of milrinone. Would avoid high dose Toprol XL for now with low output/volume overload.  - Added amiodarone 200 mg bid to try to control PVCs.  Marca Ancona 01/04/2021 10:51 AM

## 2021-01-05 ENCOUNTER — Telehealth: Payer: Self-pay | Admitting: Internal Medicine

## 2021-01-05 ENCOUNTER — Other Ambulatory Visit: Payer: Self-pay | Admitting: Internal Medicine

## 2021-01-05 DIAGNOSIS — E1121 Type 2 diabetes mellitus with diabetic nephropathy: Secondary | ICD-10-CM | POA: Diagnosis not present

## 2021-01-05 DIAGNOSIS — R809 Proteinuria, unspecified: Secondary | ICD-10-CM | POA: Diagnosis not present

## 2021-01-05 DIAGNOSIS — Z9114 Patient's other noncompliance with medication regimen: Secondary | ICD-10-CM | POA: Diagnosis not present

## 2021-01-05 DIAGNOSIS — I10 Essential (primary) hypertension: Secondary | ICD-10-CM | POA: Diagnosis not present

## 2021-01-05 DIAGNOSIS — I5021 Acute systolic (congestive) heart failure: Secondary | ICD-10-CM | POA: Diagnosis not present

## 2021-01-05 DIAGNOSIS — N1831 Chronic kidney disease, stage 3a: Secondary | ICD-10-CM | POA: Diagnosis not present

## 2021-01-05 DIAGNOSIS — I509 Heart failure, unspecified: Secondary | ICD-10-CM | POA: Diagnosis not present

## 2021-01-05 LAB — CBC
HCT: 35.6 % — ABNORMAL LOW (ref 39.0–52.0)
Hemoglobin: 12.4 g/dL — ABNORMAL LOW (ref 13.0–17.0)
MCH: 27.9 pg (ref 26.0–34.0)
MCHC: 34.8 g/dL (ref 30.0–36.0)
MCV: 80.2 fL (ref 80.0–100.0)
Platelets: 260 10*3/uL (ref 150–400)
RBC: 4.44 MIL/uL (ref 4.22–5.81)
RDW: 16.2 % — ABNORMAL HIGH (ref 11.5–15.5)
WBC: 5.5 10*3/uL (ref 4.0–10.5)
nRBC: 0 % (ref 0.0–0.2)

## 2021-01-05 LAB — BASIC METABOLIC PANEL
Anion gap: 12 (ref 5–15)
BUN: 32 mg/dL — ABNORMAL HIGH (ref 8–23)
CO2: 30 mmol/L (ref 22–32)
Calcium: 9.3 mg/dL (ref 8.9–10.3)
Chloride: 96 mmol/L — ABNORMAL LOW (ref 98–111)
Creatinine, Ser: 1.76 mg/dL — ABNORMAL HIGH (ref 0.61–1.24)
GFR, Estimated: 43 mL/min — ABNORMAL LOW (ref >60)
Glucose, Bld: 145 mg/dL — ABNORMAL HIGH (ref 70–99)
Potassium: 3.8 mmol/L (ref 3.5–5.1)
Sodium: 138 mmol/L (ref 135–145)

## 2021-01-05 LAB — GLUCOSE, CAPILLARY: Glucose-Capillary: 183 mg/dL — ABNORMAL HIGH (ref 70–99)

## 2021-01-05 LAB — MAGNESIUM: Magnesium: 2 mg/dL (ref 1.7–2.4)

## 2021-01-05 MED ORDER — TORSEMIDE 40 MG PO TABS: 40.0000 mg | ORAL_TABLET | Freq: Every day | ORAL | 1 refills | Status: DC

## 2021-01-05 MED ORDER — DIGOXIN 125 MCG PO TABS: 0.1250 mg | ORAL_TABLET | Freq: Every day | ORAL | 1 refills | Status: DC

## 2021-01-05 MED ORDER — AMIODARONE HCL 200 MG PO TABS
200.0000 mg | ORAL_TABLET | Freq: Every day | ORAL | Status: DC
  Administered 2021-01-05: 200 mg via ORAL
  Filled 2021-01-05: qty 1

## 2021-01-05 MED ORDER — SPIRONOLACTONE 25 MG PO TABS: 25.0000 mg | ORAL_TABLET | Freq: Every day | ORAL | 1 refills | Status: DC

## 2021-01-05 MED ORDER — MILRINONE LACTATE IN DEXTROSE 20-5 MG/100ML-% IV SOLN
0.1250 ug/kg/min | INTRAVENOUS | Status: DC
  Administered 2021-01-05: 0.125 ug/kg/min via INTRAVENOUS

## 2021-01-05 MED ORDER — ASPIRIN 81 MG PO CHEW: 81.0000 mg | CHEWABLE_TABLET | Freq: Every day | ORAL | 2 refills | Status: DC

## 2021-01-05 MED ORDER — SACUBITRIL-VALSARTAN 49-51 MG PO TABS: 1.0000 | ORAL_TABLET | Freq: Two times a day (BID) | ORAL | 1 refills | Status: DC

## 2021-01-05 MED ORDER — SACUBITRIL-VALSARTAN 24-26 MG PO TABS: 2.0000 | ORAL_TABLET | Freq: Two times a day (BID) | ORAL | Status: DC

## 2021-01-05 MED ORDER — AMIODARONE HCL 200 MG PO TABS: 200.0000 mg | ORAL_TABLET | Freq: Every day | ORAL | 1 refills | Status: DC

## 2021-01-05 MED ORDER — EMPAGLIFLOZIN 10 MG PO TABS: 10.0000 mg | ORAL_TABLET | Freq: Every day | ORAL | 1 refills | Status: DC

## 2021-01-05 MED ORDER — SACUBITRIL-VALSARTAN 49-51 MG PO TABS
1.0000 | ORAL_TABLET | Freq: Two times a day (BID) | ORAL | Status: DC
  Administered 2021-01-05: 1 via ORAL
  Filled 2021-01-05: qty 1

## 2021-01-05 MED ORDER — POTASSIUM CHLORIDE ER 20 MEQ PO TBCR: 20.0000 meq | EXTENDED_RELEASE_TABLET | Freq: Every day | ORAL | 1 refills | Status: DC

## 2021-01-05 MED FILL — SPIRONOLACTONE 25 MG TABLET: 25 | 30 days supply | Qty: 30 | Fill #0

## 2021-01-05 MED FILL — AMIODARONE HCL 200 MG TAB: 200 | 30 days supply | Qty: 30 | Fill #0

## 2021-01-05 MED FILL — POTASSIUM CHLORIDE 20meqER: 20 | 30 days supply | Qty: 30 | Fill #0

## 2021-01-05 MED FILL — ASPIRIN LOW DOSE 81 MG CHEW: 81 | 30 days supply | Qty: 30 | Fill #0

## 2021-01-05 MED FILL — DIGOXIN 0.125 MG TABLET: 125 | 30 days supply | Qty: 30 | Fill #0

## 2021-01-05 MED FILL — TORSEMIDE 20 MG TABLET: 20 | 30 days supply | Qty: 60 | Fill #0

## 2021-01-05 MED FILL — ENTRESTO 49 MG-51 MG TABLET: 49-51 | 30 days supply | Qty: 60 | Fill #0

## 2021-01-05 MED FILL — JARDIANCE 10 MG TABLET: 10 | 30 days supply | Qty: 30 | Fill #0

## 2021-01-05 MED FILL — Nitroglycerin IV Soln 100 MCG/ML in D5W: INTRA_ARTERIAL | Qty: 10 | Status: AC

## 2021-01-05 NOTE — Progress Notes (Signed)
Pt. Weighed 2x on standing scale to obtain accurate weight. Significant change from standing weight on 01/04/21. See flowsheets.

## 2021-01-05 NOTE — TOC Transition Note (Signed)
Transition of Care [facility]) - CM/SW Discharge Note   Patient Details  Name: Scott Greer MRN: [id number] Date of Birth: November 27, 1955  [facility]) CM/SW Contact:  Leone Haven, RN Phone Number: 01/05/2021, 12:04 PM   Clinical Narrative:    NCM spoke with patient at bedside , he has scale at home, he has pill box which was given to him on this admit.  He would like to have a PCS application to take to his PCP.  NCM will give him a copy of the PCS app. He has no other needs.    Final next level of care: Home/Self Care Barriers to Discharge: No Barriers Identified   Patient Goals and CMS Choice Patient states their goals for this hospitalization and ongoing recovery are:: get better   Choice offered to / list presented to : NA  Discharge Placement                       Discharge Plan and Services                  DME Agency: NA       HH Arranged: NA          Social Determinants of Health (SDOH) Interventions Food Insecurity Interventions: Other (Comment) (Pt has food stamps) Financial Strain Interventions: Other (Comment) (SW will see at [facility] TOC appt) Housing Interventions: Other (Comment) (SW to see at [facility]) Transportation Interventions: Patient Refused,Other (Comment) (pt delcined [facility] transport at this time.) Alcohol Brief Interventions/Follow-up: Alcohol Education,Brief Advice   Readmission Risk Interventions Readmission Risk Prevention Plan 06/08/2019  Transportation Screening Complete  PCP or Specialist Appt within 5-7 Days Complete  Home Care Screening Complete  Medication Review (RN CM) Complete  Some recent data might be hidden

## 2021-01-05 NOTE — Consult Note (Signed)
   [facility] Inpatient Consult   01/05/2021  Scott Greer 26-Dec-1955 [id number]  Community Referral:  Managed Medicaid [[employer]]  Patient evaluated for community based chronic disease management services with Managed Medicaid Program as a benefit of patient's [employer]. Spoke with patient at bedside phone to explain follow up services and support.  Patient will receive post hospital transition of care call and for assessments fo community needs for care/disease management.  Patient states he will be returning to [facility] with Dr. Jonah Blue for follow up and get his meds. He has a PCS application as well.  Plan: Referral made for post hospital Managed Medicaid disease management.  Of note, services does not replace or interfere with any services that are arranged by inpatient Transition of Care [TOC] team     For additional questions or referrals please contact:    Charlesetta Shanks, RN BSN CCM [facility]  [phone] business mobile phone Toll free office [phone]  Fax number: [phone] [email or web address]  [email or web address]

## 2021-01-05 NOTE — Progress Notes (Incomplete)
Advanced Heart Failure Rounding Note  PCP-Cardiologist: Kristeen Miss, MD   Subjective:    -2/28 night patient removed his PICC  Brisk diuresis overnight on peripheral milrinone 0.25 and IV lasix gtt 12mg .  He had 14L output yesterday, renal function stable bp .  Still very insistent on wanting to get out of here, he may stay for one more day.     Objective:   Weight Range: 90.6 kg Body mass index is 24.96 kg/m.   Vital Signs:   Temp:  [97.9 F (36.6 C)-98.3 F (36.8 C)] 98.3 F (36.8 C) (03/03 0401) Pulse Rate:  [70-89] 89 (03/03 0401) Resp:  [18-22] 18 (03/03 0401) BP: (125-151)/(80-113) 151/80 (03/03 0401) SpO2:  [97 %-100 %] 100 % (03/03 0401) Weight:  [90.6 kg] 90.6 kg (03/03 0401) Last BM Date: 01/04/21  Weight change: Filed Weights   01/03/21 0319 01/04/21 0347 01/05/21 0401  Weight: 106.3 kg 101.3 kg 90.6 kg    Intake/Output:   Intake/Output Summary (Last 24 hours) at 01/05/2021 0745 Last data filed at 01/05/2021 03/07/2021 Gross per 24 hour  Intake 1277.27 ml  Output 5638 ml  Net -11897.73 ml      Physical Exam    Cardiac: JVD 12, normal rate and rhythm, clear s1 and s2, no murmurs, rubs or gallops, 3+ LE edema bilaterally Pulmonary: decreased breath sounds right base, mild rales left base, not in distress Abdominal: non distended abdomen, soft and nontender Psych: Alert, conversant, in good spirits    Telemetry   SR and PVCs 90s   EKG    n/a  Labs    CBC Recent Labs    01/04/21 0336 01/05/21 0358  WBC 5.7 5.5  NEUTROABS 3.9  --   HGB 11.3* 12.4*  HCT 32.8* 35.6*  MCV 81.2 80.2  PLT 216 260   Basic Metabolic Panel Recent Labs    03/07/21 0336 01/04/21 1625 01/05/21 0358  NA 138 140 138  K 3.8 3.8 3.8  CL 99 97* 96*  CO2 29 29 30   GLUCOSE 197* 107* 145*  BUN 37* 33* 32*  CREATININE 1.71* 1.64* 1.76*  CALCIUM 8.9 9.4 9.3  MG 2.1  --  2.0   Liver Function Tests No results for input(s): AST, ALT, ALKPHOS, BILITOT, PROT,  ALBUMIN in the last 72 hours. No results for input(s): LIPASE, AMYLASE in the last 72 hours. Cardiac Enzymes No results for input(s): CKTOTAL, CKMB, CKMBINDEX, TROPONINI in the last 72 hours.  BNP: BNP (last 3 results) Recent Labs    12/25/20 0019  BNP 2,579.0*    ProBNP (last 3 results) No results for input(s): PROBNP in the last 8760 hours.   D-Dimer No results for input(s): DDIMER in the last 72 hours. Hemoglobin A1C No results for input(s): HGBA1C in the last 72 hours. Fasting Lipid Panel No results for input(s): CHOL, HDL, LDLCALC, TRIG, CHOLHDL, LDLDIRECT in the last 72 hours. Thyroid Function Tests No results for input(s): TSH, T4TOTAL, T3FREE, THYROIDAB in the last 72 hours.  Invalid input(s): FREET3  Other results:   Imaging    MR CARDIAC MORPHOLOGY W WO CONTRAST  Result Date: 01/04/2021 CLINICAL DATA:  Cardiomyopathy of uncertain etiology EXAM: CARDIAC MRI TECHNIQUE: The patient was scanned on a 1.5 Tesla GE magnet. A dedicated cardiac coil was used. Functional imaging was done using Fiesta sequences. 2,3, and 4 chamber views were done to assess for RWMA's. Modified Simpson's rule using a short axis stack was used to calculate an ejection fraction on  a dedicated work [employer]. The patient received 8 cc of Gadavist. After 10 minutes inversion recovery sequences were used to assess for infiltration and scar tissue. FINDINGS: Moderate right and small left pleural effusions. Increased interstitial markings suggestive of pulmonary edema. Moderately dilated left ventricle with mild LV hypertrophy. Diffuse hypokinesis with EF 23%. No LV thrombus visualized. Mildly dilated right ventricle with severe systolic dysfunction, EF 22%. Mild right and left atrial enlargement. No significant aortic or mitral regurgitation noted but valves not well-visualized. On delayed enhancement imaging, there was subepicardial late gadolinium enhancement (LGE) in the mid  inferoseptal wall at the RV insertion site. Measurements: LVEDV 320 mL LVSV 72 mL LVEF 23% RVEDV 283 mL RVSV 61 mL RVEF 22% IMPRESSION: 1.  Technically difficult study due to poor breath-holding. 2.  Moderate right, small left pleural effusions. 3. Moderately dilated left ventricle with mild LV hypertrophy. EF 23%, diffuse hypokinesis. 4.  Mildly dilated right ventricle with EF 22%. 5. LGE at RV insertion site, this is a nonspecific pattern suggesting LV/RV volume overload. Dalton Mclean Electronically Signed   By: Marca Ancona M.D.   On: 01/04/2021 19:08     Medications:     Scheduled Medications: . amiodarone  200 mg Oral BID  . aspirin  81 mg Oral Daily  . atorvastatin  10 mg Oral Daily  . Chlorhexidine Gluconate Cloth  6 each Topical Daily  . digoxin  0.125 mg Oral Daily  . empagliflozin  10 mg Oral Daily  . insulin aspart  0-15 Units Subcutaneous TID WC  . insulin aspart  0-5 Units Subcutaneous QHS  . sacubitril-valsartan  1 tablet Oral BID  . sodium chloride flush  3 mL Intravenous Q12H  . spironolactone  25 mg Oral Daily    Infusions: . sodium chloride Stopped (01/03/21 0340)  . furosemide (LASIX) 200 mg in dextrose 5% 100 mL (2mg /mL) infusion 12 mg/hr (01/04/21 1504)  . milrinone 0.25 mcg/kg/min (01/04/21 1509)    PRN Medications: sodium chloride, acetaminophen, morphine injection, ondansetron (ZOFRAN) IV, sodium chloride flush     Assessment/Plan   . Acute Biventricular Heart Failure -->Cardiogenic Shock  - ECHO LVEF 25-30% RV severely reduced  - RHC with elevated filling pressures and low output heart failure. NICM . HIV NR.  ? HTN/ETOH/PVCs.  - He pulled out PICC line 2/28. For now we will leave PiCC out and try to use PIV for milrinone and lasix drip.  -Still markedly overloaded wanting to leave soon, renal function improved so will continue with aggressive diuresis using milrinone and lasix drip using PIV  - Hold bb with low output - continue entresto 24-26 mg  twice a day may be able to go back up tomorrow, renal function improving - Continue jardiance 10 mg daily  - Continue spironolactone 12.5 mg daily  2. CKD Stage IIIb -Creatinine on admit 1.6  -Creatinine 1.6>1.8>1.7 -Follow bmet daily.  - Watch for contrast nephropathy  3. PVCs -High burden -Continue amio 200 mg twice a day .   4. HTN -may be able to go back up on entresto tomorrow, for now continue at 24/26, spiro 12.5, jardiance  5. DMII -Hgb A1C 8.4 2 weeks ago -Continue SSI  6. Tobacco Abuse  7. Social  Consult HFSW Team. He has hard time paying for medications/housing.   Palliative care saw him yesterday note pending  Length of Stay: 10  3/28, MD  01/05/2021, 7:45 AM   Advanced Heart Failure Team Pager [phone] (M-F;  7a - 4p)  Please contact [facility] for night-coverage after hours (4p -7a ) and weekends on [email or web address]

## 2021-01-05 NOTE — Progress Notes (Signed)
Patient ID: Scott Greer, male   DOB: 06-30-1956, 65 y.o.   MRN: [id number]     Advanced Heart Failure Rounding Note  PCP-Cardiologist: Kristeen Miss, MD   Subjective:    -2/28 night patient removed his PICC  Milrinone down to 0.125 this morning. He diuresed markedly well, weight down about 20 lbs (52 lbs total).  Creatinine stable 1.76.   Feels great, insists that he is going home.   Cardiac MRI: 1.  Technically difficult study due to poor breath-holding. 2.  Moderate right, small left pleural effusions. 3.  Moderately dilated left ventricle with mild LV hypertrophy. EF 23%, diffuse hypokinesis. 4.  Mildly dilated right ventricle with EF 22%. 5.  LGE at RV insertion site, this is a nonspecific pattern suggesting LV/RV volume overload.   Objective:   Weight Range: 90.6 kg Body mass index is 24.96 kg/m.   Vital Signs:   Temp:  [97.9 F (36.6 C)-98.3 F (36.8 C)] 98.3 F (36.8 C) (03/03 0401) Pulse Rate:  [70-89] 89 (03/03 0401) Resp:  [18-22] 18 (03/03 0401) BP: (125-151)/(80-113) 151/80 (03/03 0401) SpO2:  [97 %-100 %] 100 % (03/03 0401) Weight:  [90.6 kg] 90.6 kg (03/03 0401) Last BM Date: 01/04/21  Weight change: Filed Weights   01/03/21 0319 01/04/21 0347 01/05/21 0401  Weight: 106.3 kg 101.3 kg 90.6 kg    Intake/Output:   Intake/Output Summary (Last 24 hours) at 01/05/2021 0848 Last data filed at 01/05/2021 0815 Gross per 24 hour  Intake 1637.27 ml  Output 69794 ml  Net -12287.73 ml      Physical Exam    General: NAD Neck: No JVD, no thyromegaly or thyroid nodule.  Lungs: Decreased BS right base.  CV: Lateral PMI.  Heart regular S1/S2, no S3/S4, no murmur.  No peripheral edema.  Abdomen: Soft, nontender, no hepatosplenomegaly, no distention.  Skin: Intact without lesions or rashes.  Neurologic: Alert and oriented x 3.  Psych: Normal affect. Extremities: No clubbing or cyanosis.  HEENT: Normal.    Telemetry   SR and PVCs 90s (personally  reviewed).   EKG    n/a  Labs    CBC Recent Labs    01/04/21 0336 01/05/21 0358  WBC 5.7 5.5  NEUTROABS 3.9  --   HGB 11.3* 12.4*  HCT 32.8* 35.6*  MCV 81.2 80.2  PLT 216 260   Basic Metabolic Panel Recent Labs    80/16/55 0336 01/04/21 1625 01/05/21 0358  NA 138 140 138  K 3.8 3.8 3.8  CL 99 97* 96*  CO2 29 29 30   GLUCOSE 197* 107* 145*  BUN 37* 33* 32*  CREATININE 1.71* 1.64* 1.76*  CALCIUM 8.9 9.4 9.3  MG 2.1  --  2.0   Liver Function Tests No results for input(s): AST, ALT, ALKPHOS, BILITOT, PROT, ALBUMIN in the last 72 hours. No results for input(s): LIPASE, AMYLASE in the last 72 hours. Cardiac Enzymes No results for input(s): CKTOTAL, CKMB, CKMBINDEX, TROPONINI in the last 72 hours.  BNP: BNP (last 3 results) Recent Labs    12/25/20 0019  BNP 2,579.0*    ProBNP (last 3 results) No results for input(s): PROBNP in the last 8760 hours.   D-Dimer No results for input(s): DDIMER in the last 72 hours. Hemoglobin A1C No results for input(s): HGBA1C in the last 72 hours. Fasting Lipid Panel No results for input(s): CHOL, HDL, LDLCALC, TRIG, CHOLHDL, LDLDIRECT in the last 72 hours. Thyroid Function Tests No results for input(s): TSH, T4TOTAL, T3FREE, THYROIDAB  in the last 72 hours.  Invalid input(s): FREET3  Other results:   Imaging    MR CARDIAC MORPHOLOGY W WO CONTRAST  Result Date: 01/04/2021 CLINICAL DATA:  Cardiomyopathy of uncertain etiology EXAM: CARDIAC MRI TECHNIQUE: The patient was scanned on a 1.5 Tesla GE magnet. A dedicated cardiac coil was used. Functional imaging was done using Fiesta sequences. 2,3, and 4 chamber views were done to assess for RWMA's. Modified Simpson's rule using a short axis stack was used to calculate an ejection fraction on a dedicated work [employer], [employer]. The patient received 8 cc of Gadavist. After 10 minutes inversion recovery sequences were used to assess for infiltration and scar tissue.  FINDINGS: Moderate right and small left pleural effusions. Increased interstitial markings suggestive of pulmonary edema. Moderately dilated left ventricle with mild LV hypertrophy. Diffuse hypokinesis with EF 23%. No LV thrombus visualized. Mildly dilated right ventricle with severe systolic dysfunction, EF 22%. Mild right and left atrial enlargement. No significant aortic or mitral regurgitation noted but valves not well-visualized. On delayed enhancement imaging, there was subepicardial late gadolinium enhancement (LGE) in the mid inferoseptal wall at the RV insertion site. Measurements: LVEDV 320 mL LVSV 72 mL LVEF 23% RVEDV 283 mL RVSV 61 mL RVEF 22% IMPRESSION: 1.  Technically difficult study due to poor breath-holding. 2.  Moderate right, small left pleural effusions. 3. Moderately dilated left ventricle with mild LV hypertrophy. EF 23%, diffuse hypokinesis. 4.  Mildly dilated right ventricle with EF 22%. 5. LGE at RV insertion site, this is a nonspecific pattern suggesting LV/RV volume overload. Genny Caulder Electronically Signed   By: Marca Ancona M.D.   On: 01/04/2021 19:08     Medications:     Scheduled Medications: . amiodarone  200 mg Oral BID  . aspirin  81 mg Oral Daily  . atorvastatin  10 mg Oral Daily  . Chlorhexidine Gluconate Cloth  6 each Topical Daily  . digoxin  0.125 mg Oral Daily  . empagliflozin  10 mg Oral Daily  . insulin aspart  0-15 Units Subcutaneous TID WC  . insulin aspart  0-5 Units Subcutaneous QHS  . sacubitril-valsartan  1 tablet Oral BID  . sodium chloride flush  3 mL Intravenous Q12H  . spironolactone  25 mg Oral Daily    Infusions: . sodium chloride Stopped (01/03/21 0340)    PRN Medications: sodium chloride, acetaminophen, morphine injection, ondansetron (ZOFRAN) IV, sodium chloride flush     Assessment/Plan    1. Acute systolic CHF: Echo this admission reviewed, EF 20-25% range with severe RV dysfunction. Nonischemic cardiomyopathy,  minimal CAD on coronary angiography. HIV negative. He drinks but not markedly heavily (pint/week liquor). He apparently had poorly controlled HTN prior to admission (not taking meds for about 9 months). 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 RHC on 2/28 showed elevated filling pressures especially on the right side, also low CI at 1.3. PICC placed to follow CVP and co-ox, co-ox was 38.5% and milrinone 0.25 started.  However, patient pulled out his PICC and now has PIV. Cardiac MRI with LV EF 23%, RVEF 22%, nonspecific RV insertion LGE.  Excellent diuresis yesterday, weight down 20 lbs (50 lbs total).  He does not look volume overloaded on exam.  He remains on milrinone 0.25 and Lasix gtt.  - He says that he has to go home today.  We discussed a slow wean of milrinone but he is insistent.  Will decrease  milrinone to 0.125 now and stop later this morning.    - Stop IV Lasix, start torsemide 40 mg daily tomorrow.   - Hold Toprol XL with markedly low output and volume overload.  - Increase Entresto to 49/51 bid.   - Continue Jardiance.  - Increase spironolactone 25 daily.  - Suspect he would not be compliant with Bidil at home.  - Continue digoxin 0.125 daily.  - Given social situation and ETOH abuse, he would not be a good candidate for advanced therapies at this time.  - I suspect ETOH played a major role in CMP.  We discussed cessation of ETOH today.  2. CKD: Stage 3a. 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 Creatinine stable 1.76.  3. HTN: BP mildly elevated, increasing Entresto.   4. PVCs: There appears to be a high burden by telemetry, may be worse with addition of milrinone. Would avoid high dose Toprol XL for now with low output/volume overload.  - Added amiodarone 200 mg bid to try to control PVCs.  Patient insists on discharge today.  Will set up HF clinic followup and paramedicine followup.  Meds for  home: Entresto 49/51 bid, spironolactone 25 daily, torsemide 40 mg daily starting tomorrow, Jardiance 10 mg daily, digoxin 0.125 mg daily, atorvastatin 10 mg daily, KCl 20 daily, amiodarone 200 mg daily, ASA 81 daily.   Marca Ancona 01/05/2021 8:48 AM

## 2021-01-05 NOTE — Telephone Encounter (Signed)
I attempted to reach Mr.Ceasar today to get him scheduled for a phone visit with the Managed Medicaid Team.I left my name and number for him to return my call. I will reach out again in the next 7-14 days if I have not heard back from him.

## 2021-01-05 NOTE — Progress Notes (Signed)
CARDIAC REHAB PHASE I   PRE:  Rate/Rhythm: 92 SR    BP: sitting 127/90    SaO2: 94 RA  MODE:  Ambulation: 470 ft   POST:  Rate/Rhythm: off monitor    BP: sitting didn't register     SaO2: 97 RA  Pt able to ambulate hall without c/o. Very eager to d/c. Discussed HF booklet including daily wts, low sodium diets, sx of fluid, ETOH, and activity. Pt fairly receptive.  [id number]   Harriet Masson CES, ACSM 01/05/2021 12:04 PM

## 2021-01-05 NOTE — Discharge Summary (Signed)
Physician Discharge Summary  Scott Greer BSW:[id number] DOB: 1956-01-09 DOA: 12/25/2020  PCP: Scott Pier, MD  Admit date: 12/25/2020 Discharge date: 01/05/2021  Time spent: 45 minutes  Recommendations for Outpatient Follow-up:  Patient will be discharged to home.  Patient will need to follow up with primary care provider within one week of discharge.  Follow-up with cardiology.  Repeat BMP in 1 week.  Patient should continue medications as prescribed.  Patient should follow a healthy/carb modified diet.   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  Pt not able to afford / complaint with DM or HTN meds for many months. Recently seen in clinic earlier this month, noted to have Creat of 1.5, also noted to have macro albuminuria.  Pt presents to the ED with c/o BLE and scrotum swelling. These symptoms onset and worsened over the past 2-3 weeks. Now severe. Also has DOE over the past 3 days. Symptoms constant.  He went to PCP 1.5 weeks ago, got refills on meds but never ended up picking these up, so he has been off of his meds for the past 8 months or so.  No CP. No abd pain.  Hospital Course:  Acute on chronic combined systolic and diastolic heart failure -Patient admitted with dyspnea on exertion along with lower extremity swelling and edema of the scrotum.  BNP was 2579 on admission.  Chest x-ray showed mild right basilar  atelectasis. -Cardiology consulted and appreciated -Echocardiogram showed an EF of 25 to 30% with left ventricular global hypokinesis and severely decreased function. -Placed on IV Lasix and then transitioned to Lasix and milrinone drips -Heart failure team currently following -Continue spironolactone, Toprol, Entresto -Status post cardiac catheterization on 2/28 which showed nonobstructive CAD -Monitor intake and output, daily weights -Cardiology recommending Entresto 49/51 twice daily, spironolactone 25 daily, torsemide 40 mg daily starting on 3/4, Jardiance 10 mg daily, digoxin 0.125 mg daily, atorvastatin 10 mg daily, potassium supplementation 20 mEq daily, amiodarone 200 mg daily, aspirin 81 mg daily  Diabetes mellitus, type II with hyperglycemia -Hemoglobin A1c 8.4 on 12/14/20 -Metformin currently held -Continue insulin sliding scale and CBG monitoring  Chronic kidney disease, stage IIIa -Creatinine currently 1.71 (GFR reviewed since admission, has been greater than 45, therefore falls into stage IIIa CKD) -Continue to monitor BMP  Essential hypertension -Patient has been nonadherent to medications -Continue Entresto, spironolactone, metoprolol, Lasix drip -BP stable  Microalbuminuria -Previous hospitalist discussed with Dr. Royce Macadamia, nephrology on 12/29/20.  24-hour urine collection showing urine protein of 2747 -Patient to follow-up with nephrology as an outpatient   PVC -Cardiology added amiodarone to control PVCs  Consultants Cardiology/CHF team Nephrology, via phone  Procedures  Echocardiogram Cardiac catheterization  Discharge Exam: Vitals:   01/05/21 0401 01/05/21 0922  BP: (!) 151/80 121/72  Pulse: 89 85  Resp: 18 16  Temp: 98.3 F (36.8 C) 98.4 F (36.9 C)  SpO2: 100% 99%   Exam  General: Well developed, chronically ill-appearing, NAD  HEENT: NCAT, mucous membranes moist.   Cardiovascular: S1 S2  auscultated, no murmurs, RRR  Respiratory:  Diminished breath sounds  Abdomen: Soft, nontender, nondistended, + bowel sounds  Extremities: UNNA boots in place bilaterally   Neuro: AAOx3, nonfocal  Psych: appropriate mood and affect, pleasant  Discharge Instructions Discharge Instructions    Diet - low sodium heart healthy   Complete by: As directed    Diet Carb Modified   Complete by: As directed    Discharge instructions   Complete by: As directed    Patient will be discharged to home.  Patient will need to follow up with primary care provider within one week of discharge.  Follow-up with cardiology.  Repeat BMP in 1 week.  Patient should continue medications as prescribed.  Patient should follow a healthy/carb modified diet.   Heart Failure patients record your daily weight using the same scale at the same time of day   Complete by: As directed    Increase activity slowly   Complete by: As directed      Allergies as of 01/05/2021      Reactions   Bee Venom Hives, Itching, Swelling      Medication List    STOP taking these medications   lisinopril 10 MG tablet Commonly known as: ZESTRIL     TAKE these medications   amiodarone 200 MG tablet Commonly known as: PACERONE Take 1 tablet (200 mg total) by mouth daily. Start taking on: January 06, 2021   aspirin 81 MG chewable tablet Chew 1 tablet (81 mg total) by mouth daily. Start taking on: January 06, 2021   atorvastatin 10 MG tablet Commonly known as: Lipitor Take 1 tablet (10 mg total) by mouth daily.   digoxin 0.125 MG tablet Commonly known as: LANOXIN Take 1 tablet (0.125 mg total) by mouth daily. Start taking on: January 06, 2021   empagliflozin 10 MG Tabs tablet Commonly known as: JARDIANCE Take 1 tablet (10 mg total) by mouth daily. Start taking on: January 06, 2021   metFORMIN 500 MG tablet Commonly known as: Glucophage Take 1 tablet (500 mg total) by mouth 2 (two) times daily with a meal.   Potassium Chloride ER 20 MEQ Tbcr Take 20 mEq by mouth  daily.   sacubitril-valsartan 49-51 MG Commonly known as: ENTRESTO Take 1 tablet by mouth 2 (two) times daily.   spironolactone 25 MG tablet Commonly known as: ALDACTONE Take 1 tablet (25 mg total) by mouth daily. Start taking on: January 06, 2021   Torsemide 40 MG Tabs Take 40 mg by mouth daily. Start taking on: January 06, 2021   True Metrix Blood Glucose Test test strip Generic drug: glucose blood Use as instructed   True Metrix Meter w/Device Kit Use as directed   TRUEplus Lancets 28G Misc Use as directed   Vitamin D (Ergocalciferol) 1.25 MG (50000 UNIT) Caps capsule Commonly known as: DRISDOL Take 1 capsule (50,000 Units total) by mouth every 7 (seven) days.      Allergies  Allergen Reactions  . Bee Venom Hives, Itching and Swelling    Follow-up Information    [facility]. Go on 01/03/2021.   Specialty: Cardiology Why: AT 1PM. Bring all medications and pill box with you to your appointment.  Call the day before if you decide you need transportation. This appt will be 1 hour as you will see a HF provider, pharmacist, and social worker. Contact information: [address] [id number] [place] [phone]  Scott Pier, MD. Schedule an appointment as soon as possible for a visit in 1 week(s).   Specialty: Internal Medicine Why: Hospital follow up Contact information: [place] [address] [phone]        [name], [name], MD .   Specialty: Cardiology Contact information: [place] [address] [phone]                The results of significant diagnostics from this hospitalization (including imaging, microbiology, ancillary and laboratory) are listed below for reference.    Significant Diagnostic Studies: DG Chest 2 View  Result Date: 12/25/2020 CLINICAL DATA:  Shortness of breath. EXAM: CHEST - 2 VIEW  COMPARISON:  June 06, 2019 FINDINGS: Decreased lung volumes are seen which is likely, in part, secondary to the degree of patient inspiration. Mild atelectasis is seen within the medial aspect of the right lung base. There is no evidence of a pleural effusion or pneumothorax. Stable elevation of the right hemidiaphragm is seen. The cardiac silhouette is mildly enlarged. The visualized skeletal structures are unremarkable. IMPRESSION: Mild right basilar atelectasis. Electronically Signed   By: Scott Greer M.D.   On: 12/25/2020 19:39   CARDIAC CATHETERIZATION  Result Date: 01/02/2021  Ost LAD to Prox LAD lesion is 30% stenosed.  NIVAN MELENDREZ is a 65 y.o. male  [id number] LOCATION:  FACILITY: [place] PHYSICIAN: Quay Burow, M.D. 02/25/1956 DATE OF PROCEDURE:  01/02/2021 DATE OF DISCHARGE: CARDIAC CATHETERIZATION History obtained from chart review.IMANOL BIHL a 65 y.o.malewith type 2 diabetes, hypertension, hyperlipidemia, peripheral osteomyelitis with great toe amputation, tobacco abuse who was admitted on 12/25/2020 for new onset systolic heart failure.  A 2D echo revealed an EF in the 20% range with global hypokinesia.  His troponins were low.  He was referred for right left heart cath to define his anatomy and physiology.   Mr. 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  He has severe decrease in cardiac output with a cardiac index of 1.3 L/min/m elevated filling pressures.  He has a nonischemic cardiomyopathy that will need guideline directed optimal medical therapy.  He may need inotropic therapy in the short-term to optimize his hemodynamics.  The radial sheath was removed and a TR band was placed on the right wrist to achieve patent hemostasis.  The patient left lab in stable condition. Quay Burow. MD, [facility] 01/02/2021 11:13 AM   MR CARDIAC MORPHOLOGY W WO CONTRAST  Result Date: 01/04/2021 CLINICAL DATA:  Cardiomyopathy of uncertain etiology EXAM:  CARDIAC MRI TECHNIQUE: The patient was scanned on a 1.5 Tesla GE magnet. A dedicated cardiac coil was used. Functional imaging was done using Fiesta sequences. 2,3, and 4 chamber views were done to assess for RWMA's. Modified Simpson's rule using a short axis stack was used to calculate an ejection fraction on a dedicated work Conservation officer, nature. The patient received 8 cc of Gadavist. After 10 minutes inversion recovery sequences were used to assess for infiltration and scar tissue. FINDINGS: Moderate right and small left pleural effusions. Increased interstitial markings suggestive of pulmonary edema. Moderately dilated left ventricle with mild LV hypertrophy. Diffuse hypokinesis with EF 23%. No LV thrombus visualized. Mildly dilated right ventricle with severe systolic dysfunction, EF 89%. Mild right and left atrial enlargement. No significant aortic or mitral regurgitation noted but valves not well-visualized. On delayed enhancement imaging, there was subepicardial late gadolinium enhancement (LGE) in the mid inferoseptal wall at the  RV insertion site. Measurements: LVEDV 320 mL LVSV 72 mL LVEF 23% RVEDV 283 mL RVSV 61 mL RVEF 22% IMPRESSION: 1.  Technically difficult study due to poor breath-holding. 2.  Moderate right, small left pleural effusions. 3. Moderately dilated left ventricle with mild LV hypertrophy. EF 23%, diffuse hypokinesis. 4.  Mildly dilated right ventricle with EF 22%. 5. LGE at RV insertion site, this is a nonspecific pattern suggesting LV/RV volume overload. Scott Greer Electronically Signed   By: Loralie Champagne M.D.   On: 01/04/2021 19:08   ECHOCARDIOGRAM COMPLETE  Result Date: 12/26/2020    ECHOCARDIOGRAM REPORT   Patient Name:   Scott Greer Date of Exam: 12/26/2020 Medical Rec #:  [id number]      Height:       75.0 in Accession #:    [id number]     Weight:       251.0 lb Date of Birth:  [birth year]     BSA:          2.417 m Patient Age:    65 years       BP:            131/85 mmHg Patient Gender: M              HR:           95 bpm. Exam Location:  Inpatient Procedure: 2D Echo, Cardiac Doppler and Color Doppler Indications:    CHF-Acute Diastolic Q65.78  History:        Patient has no prior history of Echocardiogram examinations.                 Risk Factors:Current Smoker, Hypertension, Diabetes and                 Dyslipidemia.  Sonographer:    Dustin Flock Referring Phys: [place]  1. Left ventricular ejection fraction, by estimation, is 25 to 30%. The left ventricle has severely decreased function. The left ventricle demonstrates global hypokinesis. The left ventricular internal cavity size was mildly dilated. There is mild left ventricular hypertrophy. Left ventricular diastolic parameters are indeterminate.  2. Right ventricular systolic function is severely reduced. The right ventricular size is mildly enlarged. There is mildly elevated pulmonary artery systolic pressure.  3. Right atrial size was mildly dilated.  4. The mitral valve is abnormal. Mild mitral valve regurgitation.  5. The aortic valve is grossly normal. Aortic valve regurgitation is not visualized. Mild to moderate aortic valve sclerosis/calcification is present, without any evidence of aortic stenosis.  6. The inferior vena cava is dilated in size with <50% respiratory variability, suggesting right atrial pressure of 15 mmHg. FINDINGS  Left Ventricle: Left ventricular ejection fraction, by estimation, is 25 to 30%. The left ventricle has severely decreased function. The left ventricle demonstrates global hypokinesis. The left ventricular internal cavity size was mildly dilated. There is mild left ventricular hypertrophy. Left ventricular diastolic parameters are indeterminate. Right Ventricle: The right ventricular size is mildly enlarged. Right vetricular wall thickness was not assessed. Right ventricular systolic function is severely reduced. There is mildly elevated pulmonary  artery systolic pressure. The tricuspid regurgitant velocity is 2.59 m/s, and with an assumed right atrial pressure of 15 mmHg, the estimated right ventricular systolic pressure is 46.9 mmHg. Left Atrium: Left atrial size was normal in size. Right Atrium: Right atrial size was mildly dilated. Pericardium: There is no evidence of pericardial effusion. Mitral Valve: The mitral valve is abnormal. There is mild  thickening of the mitral valve leaflet(s). Mild mitral valve regurgitation. Tricuspid Valve: The tricuspid valve is normal in structure. Tricuspid valve regurgitation is trivial. Aortic Valve: The aortic valve is grossly normal. Aortic valve regurgitation is not visualized. Mild to moderate aortic valve sclerosis/calcification is present, without any evidence of aortic stenosis. Pulmonic Valve: The pulmonic valve was not well visualized. Pulmonic valve regurgitation is not visualized. Aorta: The aortic root is normal in size and structure. Venous: The inferior vena cava is dilated in size with less than 50% respiratory variability, suggesting right atrial pressure of 15 mmHg. IAS/Shunts: The interatrial septum was not assessed.  LEFT VENTRICLE PLAX 2D LVIDd:         5.40 cm      Diastology LVIDs:         4.70 cm      LV e' medial:    3.81 cm/s LV PW:         1.40 cm      LV E/e' medial:  18.3 LV IVS:        1.30 cm      LV e' lateral:   7.40 cm/s LVOT diam:     2.50 cm      LV E/e' lateral: 9.4 LV SV:         56 LV SV Index:   23 LVOT Area:     4.91 cm  LV Volumes (MOD) LV vol d, MOD A4C: 190.0 ml LV vol s, MOD A4C: 115.0 ml LV SV MOD A4C:     190.0 ml RIGHT VENTRICLE RV Basal diam:  4.50 cm RV S prime:     5.66 cm/s TAPSE (M-mode): 2.2 cm LEFT ATRIUM             Index       RIGHT ATRIUM           Index LA diam:        4.60 cm 1.90 cm/m  RA Area:     19.80 cm LA Vol (A2C):   49.0 ml 20.27 ml/m RA Volume:   68.00 ml  28.14 ml/m LA Vol (A4C):   41.0 ml 16.96 ml/m LA Biplane Vol: 44.5 ml 18.41 ml/m  AORTIC  VALVE LVOT Vmax:   58.10 cm/s LVOT Vmean:  38.600 cm/s LVOT VTI:    0.115 m  AORTA Ao Root diam: 3.00 cm MITRAL VALVE               TRICUSPID VALVE MV Area (PHT): 3.08 cm    TR Peak grad:   26.8 mmHg MV Decel Time: 246 msec    TR Vmax:        259.00 cm/s MV E velocity: 69.80 cm/s MV A velocity: 30.00 cm/s  SHUNTS MV E/A ratio:  2.33        Systemic VTI:  0.12 m                            Systemic Diam: 2.50 cm Dorris Carnes MD Electronically signed by Dorris Carnes MD Signature Date/Time: 12/26/2020/3:14:39 PM    Final    [place] EKG SITE RITE  Result Date: 01/02/2021 If Site Rite image not attached, placement could not be confirmed due to current cardiac rhythm.   Microbiology: No results found for this or any previous visit (from the past 240 hour(s)).   Labs: Basic Metabolic Panel: Recent Labs  Lab 01/01/21 0419 01/02/21 0222 01/02/21 1046 01/02/21 1709 01/03/21 4401  01/04/21 0336 01/04/21 1625 01/05/21 0358  NA 141 141   < > 139 140 138 140 138  K 3.8 4.1   < > 4.3 4.3 3.8 3.8 3.8  CL 101 103  --  100 99 99 97* 96*  CO2 31 27  --  _0 GLUCOSE 160* 127*  --  187* 169* 197* 107* 145*  BUN 28* 32*  --  36* 38* 37* 33* 32*  CREATININE 1.51* 1.65*  --  1.65* 1.80* 1.71* 1.64* 1.76*  CALCIUM 8.9 9.0  --  9.0 9.4 8.9 9.4 9.3  MG 1.9 1.9  --   --  2.0 2.1  --  2.0   < > = values in this interval not displayed.   Liver Function Tests: No results for input(s): AST, ALT, ALKPHOS, BILITOT, PROT, ALBUMIN in the last 168 hours. No results for input(s): LIPASE, AMYLASE in the last 168 hours. No results for input(s): AMMONIA in the last 168 hours. CBC: Recent Labs  Lab 01/02/21 1046 01/02/21 1050 01/03/21 0328 01/04/21 0336 01/05/21 0358  WBC  --   --  6.3 5.7 5.5  NEUTROABS  --   --   --  3.9  --   HGB 12.9*  12.6* 12.6* 12.1* 11.3* 12.4*  HCT 38.0*  37.0* 37.0* 36.3* 32.8* 35.6*  MCV  --   --  82.1 81.2 80.2  PLT  --   --  254 216 260   Cardiac Enzymes: No results for  input(s): CKTOTAL, CKMB, CKMBINDEX, TROPONINI in the last 168 hours. BNP: BNP (last 3 results) Recent Labs    12/25/20 0019  BNP 2,579.0*    ProBNP (last 3 results) No results for input(s): PROBNP in the last 8760 hours.  CBG: Recent Labs  Lab 01/04/21 0600 01/04/21 1145 01/04/21 1632 01/04/21 2151 01/05/21 0620  GLUCAP 163* 154* 113* 196* 183*       Signed:  Boleslaus Holloway  [facility] 01/05/2021, 10:58 AM

## 2021-01-05 NOTE — Progress Notes (Signed)
D/C instructions given and reviewed. Tele and IV's removed, tolerated well. Awaiting ride to transport home.

## 2021-01-05 NOTE — Plan of Care (Signed)
  Problem: Education: Goal: Knowledge of General Education information will improve Description: Including pain rating scale, medication(s)/side effects and non-pharmacologic comfort measures Outcome: Adequate for Discharge   

## 2021-01-05 NOTE — Discharge Instructions (Signed)
Heart Failure, Diagnosis  Heart failure means that your heart is not able to pump blood in the right way. This makes it hard for your body to work well. Heart failure is usually a long-term (chronic) condition. You must take good care of yourself and follow your treatment plan from your doctor. What are the causes?  High blood pressure.  Buildup of cholesterol and fat in the arteries.  Heart attack. This injures the heart muscle.  Heart valves that do not open and close properly.  Damage of the heart muscle. This is also called cardiomyopathy.  Infection of the heart muscle. This is also called myocarditis.  Lung disease. What increases the risk?  Getting older. The risk of heart failure goes up as a person ages.  Being overweight.  Being male.  Use tobacco or nicotine products.  Abusing alcohol or drugs.  Having taken medicines that can damage the heart.  Having any of these conditions: ? Diabetes. ? Abnormal heart rhythms. ? Thyroid problems. ? Low blood counts (anemia).  Having a family history of heart failure. What are the signs or symptoms?  Shortness of breath.  Coughing.  Swelling of the feet, ankles, legs, or belly.  Losing or gaining weight for no reason.  Trouble breathing.  Waking from sleep because of the need to sit up and get more air.  Fast heartbeat.  Being very tired.  Feeling dizzy, or feeling like you may pass out (faint).  Having no desire to eat.  Feeling like you may vomit (nauseous).  Peeing (urinating) more at night.  Feeling confused. How is this treated? This condition may be treated with:  Medicines. These can be given to treat blood pressure and to make the heart muscles stronger.  Changes in your daily life. These may include: ? Eating a healthy diet. ? Staying at a healthy body weight. ? Quitting tobacco, alcohol, and drug use. ? Doing exercises. ? Participating in a cardiac rehabilitation program. This program  helps you improve your health through exercise, education, and counseling.  Surgery. Surgery can be done to open blocked valves, or to put devices in the heart, such as pacemakers.  A donor heart (heart transplant). You will receive a healthy heart from a donor. Follow these instructions at home:  Treat other conditions as told by your doctor. These may include high blood pressure, diabetes, thyroid disease, or abnormal heart rhythms.  Learn as much as you can about heart failure.  Get support as you need it.  Keep all follow-up visits. Summary  Heart failure means that your heart is not able to pump blood in the right way.  This condition is often caused by high blood pressure, heart attack, or damage of the heart muscle.  Symptoms of this condition include shortness of breath and swelling of the feet, ankles, legs, or belly. You may also feel very tired or feel like you may vomit.  You may be treated with medicines, surgery, or changes in your daily life.  Treat other health conditions as told by your doctor. This information is not intended to replace advice given to you by your health care provider. Make sure you discuss any questions you have with your health care provider. Document Revised: 05/14/2020 Document Reviewed: 05/14/2020 Elsevier Patient Education  2021 Elsevier Inc.  

## 2021-01-05 NOTE — Plan of Care (Signed)
  Problem: Clinical Measurements: Goal: Ability to maintain clinical measurements within normal limits will improve Outcome: Progressing Goal: Diagnostic test results will improve Outcome: Progressing Goal: Respiratory complications will improve Outcome: Progressing   Problem: Activity: Goal: Risk for activity intolerance will decrease Outcome: Progressing   Problem: Elimination: Goal: Will not experience complications related to urinary retention Outcome: Progressing

## 2021-01-06 ENCOUNTER — Telehealth: Payer: Self-pay

## 2021-01-06 ENCOUNTER — Telehealth: Payer: Self-pay | Admitting: *Deleted

## 2021-01-06 NOTE — Telephone Encounter (Signed)
Transition Care Management Unsuccessful Follow-up Telephone Call  Date of discharge and from where:  01/05/2021 [facility]  Attempts:  1st Attempt  Reason for unsuccessful TCM follow-up call:  Unable to reach patient   Has pending appointments >> Heart & Vascular on 01/12/2021 @ 1330 and PCP on 02/10/2021 @ 0910

## 2021-01-06 NOTE — Telephone Encounter (Signed)
Transition Care Management Unsuccessful Follow-up Telephone Call  Date of discharge and from where:  01/05/2021 [facility]  Attempts:  1st Attempt  Reason for unsuccessful TCM follow-up call:  Unable to reach patient, left VM to call back. name and contact info provided.   Need to schedule HFU appt.

## 2021-01-09 ENCOUNTER — Telehealth: Payer: Self-pay

## 2021-01-09 ENCOUNTER — Telehealth: Payer: Self-pay | Admitting: Internal Medicine

## 2021-01-09 NOTE — Telephone Encounter (Signed)
Transition Care Management Unsuccessful Follow-up Telephone Call  Date of discharge and from where:  01/05/2021 [facility]  Attempts:2nd Attempt  Reason for unsuccessful TCM follow-up call:Unable to reach patient, left VM to call back. name and contact info provided.  Need to schedule HFU appt.

## 2021-01-09 NOTE — Telephone Encounter (Signed)
[facility] has reached out to the pt.  Will allow them to continue to follow up.

## 2021-01-09 NOTE — Telephone Encounter (Signed)
2nd attempt made today to reach Scott Greer to get him scheduled for a phone appt with the Managed Medicaid Team. I left my name and number on his voicemail.I will reach out again in the next 7 days if I have not heard back from him.

## 2021-01-10 ENCOUNTER — Telehealth: Payer: Self-pay

## 2021-01-10 NOTE — Telephone Encounter (Signed)
Transition Care Management Unsuccessful Follow-up Telephone Call  Date of discharge and from where:  01/05/2021, [facility]  Attempts:  3rd Attempt  Reason for unsuccessful TCM follow-up call:  Left voice messages on 3 [phone] and [phone].  Letter sent to patient requesting he contact [facility] to schedule hospital follow up appointment.

## 2021-01-10 NOTE — Telephone Encounter (Signed)
Transition Care Management Follow-up Telephone Call  Date of discharge and from where: 01/05/2021, [facility]   How have you been since you were released from the hospital? He said that the swelling has gone down in his legs.  It was very difficult to hear him as there was a lot of background noise.    Any questions or concerns? Yes - he is concerned about paying his rent. He receives $587/month disability.   Items Reviewed:  Did the pt receive and understand the discharge instructions provided? it was difficult to hear his answer.  He did not confirm or deny.   Medications obtained and verified? Yes - he said that he was given 8 medications when he left the hospital and he was instructed how to take them.  He said that he takes them in the morning if they are checked off on the med chart. He could not confirm that he has the other medications on the list that are not new.  Explained to him that it would very beneficial for him to meet with Butch Penny, RPH to review medications and set up a system for med management.  Scheduled him to meet with [facility] 01/11/2021 and instructed him to bring all of his medications with him as well as his glucometer. This appointment information, including clinic address and phone number  was text to him as he requested.    Other? No   Any new allergies since your discharge? No   Do you have support at home? lives alone  Home Care and Equipment/Supplies: Were home health services ordered? no If so, what is the name of the agency? n/a  Has the agency set up a time to come to the patient's home? not applicable Were any new equipment or medical supplies ordered?  No What is the name of the medical supply agency? n/a Were you able to get the supplies/equipment? not applicable Do you have any questions related to the use of the equipment or supplies? No   He said that he bought a scale but needs to keep a log of his weights.  Has a glucometer  but could not confirm that he knows how to use it.   Functional Questionnaire: (I = Independent and D = Dependent) ADLs: independent  Follow up appointments reviewed:   PCP Hospital f/u appt confirmed? appointment with Dr Laural Benes 02/10/2021.  he can schedule an appointment to be seen sooner if he wishes.   Specialist Hospital f/u appt confirmed? Yes  - cardiology - 01/12/2021.   Are transportation arrangements needed? No   If their condition worsens, is the pt aware to call PCP or go to the Emergency Dept.? Yes  Was the patient provided with contact information for the PCP's office or ED? Yes - the phone number for the clinic was text to him,   Was to pt encouraged to call back with questions or concerns? Yes

## 2021-01-10 NOTE — Progress Notes (Signed)
Advanced Heart Failure Clinic Note  PCP: Dr. Karle Plumber HF Cardiologist: Dr. 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 He has difficulty paying for medications. He stopped getting meds about 9 months ago. He has difficulty paying for housing.   Presented to [facility] ED on 2/21 with lower extremity edema and weakness. Echo showed biventricular HF (LVEF 25-30% + severely reduced RV). Diuresed with IV lasix, and started on Entresto, Jardiance, and spiro. Had high PVC burden so Toprol XL increased to 100 mg daily. Had cath (2/22): with elevated filling pressures, low output HF, and min CAD. Diuresed 20 lbs, started on GDMT, discharge weight 199 lbs.  Today he returns for HF post-hospital follow up. Overall feeling fine, SOB if walking fast. Denies increasing SOB, CP, dizziness, edema, or PND/Orthopnea. Appetite ok. No fever or chills. Does not weigh at home. Taking all medications. Smokes 6 cigars/day, 1 pint of liquor/week, sometimes more.   Cardiac Studies: RHC/LHC (2/22): Ost LAD -Prox LAD 30% stenosed RA 17 PA 47/27 (36)  PCWP 22 CO 3 CI 1.3  cMRI (3/22):  -Technically difficult study due to poor breath-holding. - LV EF 23%, RVEF 22%, nonspecific RV insertion LGE.  ROS: All systems negative except as listed in HPI, PMH and Problem List.  SH:  Social History   Socioeconomic History  . Marital status: Legally Separated    Spouse name: Nira Conn  . Number of children: 2  . Years of education: Not on file  . Highest education level: Associate degree: occupational, Hotel manager, or vocational program  Occupational History  . Not on file  Tobacco Use  . Smoking status: Current Every Day Smoker    Types: Cigars, Cigarettes  . Smokeless tobacco: Former Systems developer    Types: Chew  . Tobacco comment: 6 daily   Vaping Use  . Vaping Use: Never used  Substance and Sexual Activity  . Alcohol use: Yes    Alcohol/week: 11.0 standard drinks    Types: 3 Cans of beer, 8 Shots of liquor per week    Comment: occasional  . Drug use: Yes    Types: Marijuana    Comment: ocassional - last time 05/08/2020  . Sexual activity: Not Currently  Other Topics Concern  . Not on file  Social History Narrative   Out of prison for 2 months.   Lives at home with his wife.   Social Determinants of Health   Financial Resource Strain: High Risk  . Difficulty of Paying Living Expenses: Very hard  Food Insecurity: Food Insecurity Present  . Worried About Charity fundraiser in the Last Year: Sometimes true  . Ran Out of Food in the Last Year: Sometimes true  Transportation Needs: Unmet Transportation Needs  . Lack of Transportation (Medical): Yes  . Lack of Transportation (Non-Medical): Yes  Physical Activity: Inactive  . Days of Exercise per Week: 0 days  . Minutes of Exercise per Session: 0 min  Stress: Not on file  Social Connections: Not on file  Intimate Partner Violence: Not on file   FH:  Family History  Problem Relation Age of Onset  . Stroke Mother   . Diabetes Mother        Toward end of life    Past Medical History:  Diagnosis Date  . Dehiscence  of amputation stump ([place])    left great toe  . Diabetes mellitus without complication ([place]) 1497   Type II  . Glaucoma 2015  . Hyperlipidemia   . Hypertension 2005  . Osteomyelitis ([place])   . Wears glasses     Current Outpatient Medications  Medication Sig Dispense Refill  . amiodarone (PACERONE) 200 MG tablet Take 1 tablet (200 mg total) by mouth daily. 30 tablet 1  . aspirin 81 MG chewable tablet Chew 1 tablet (81 mg total) by mouth daily. 30 tablet 2  . atorvastatin (LIPITOR) 10 MG tablet Take 1 tablet (10 mg total) by mouth daily. 30 tablet 1  . digoxin (LANOXIN) 0.125 MG tablet Take 1 tablet (0.125 mg total) by mouth daily. 30 tablet 1  .  empagliflozin (JARDIANCE) 10 MG TABS tablet Take 1 tablet (10 mg total) by mouth daily. 30 tablet 1  . metFORMIN (GLUCOPHAGE) 500 MG tablet Take 1 tablet (500 mg total) by mouth 2 (two) times daily with a meal. 60 tablet 1  . potassium chloride 20 MEQ TBCR Take 20 mEq by mouth daily. 30 tablet 1  . sacubitril-valsartan (ENTRESTO) 97-103 MG Take 1 tablet by mouth 2 (two) times daily. 60 tablet 5  . spironolactone (ALDACTONE) 25 MG tablet Take 1 tablet (25 mg total) by mouth daily. 30 tablet 1  . torsemide (DEMADEX) 20 MG tablet Take 3 tablets (60 mg total) by mouth daily. 90 tablet 6  . Blood Glucose Monitoring Suppl (TRUE METRIX METER) w/Device KIT Use as directed 1 kit 0  . glucose blood (TRUE METRIX BLOOD GLUCOSE TEST) test strip Use as instructed 100 each 12  . TRUEplus Lancets 28G MISC Use as directed 100 each 4  . Vitamin D, Ergocalciferol, (DRISDOL) 1.25 MG (50000 UNIT) CAPS capsule Take 1 capsule (50,000 Units total) by mouth every 7 (seven) days. 4 capsule 2   No current facility-administered medications for this encounter.    Vitals:   01/12/21 1149  BP: 136/82  Pulse: 97  SpO2: 97%  Weight: 88.7 kg (195 lb 9.6 oz)   Wt Readings from Last 3 Encounters:  01/12/21 88.7 kg (195 lb 9.6 oz)  01/05/21 90.6 kg (199 lb 11.2 oz)  12/14/20 113.9 kg (251 lb)   PHYSICAL EXAM:  General:  NAD. No resp difficulty. HEENT: Normal. Neck: Supple. JVP 7-8. Carotids 2+ bilat; no bruits. No lymphadenopathy or thryomegaly appreciated. Cor: PMI nondisplaced. Regular rate & rhythm. No rubs, gallops or murmurs. Lungs: Clear, diminished in bases Abdomen: Soft, nontender, nondistended. No hepatosplenomegaly. No bruits or masses. Good bowel sounds. Extremities: No cyanosis, clubbing, rash, 1+ LE edema, L>R Neuro: Alert & oriented x 3, cranial nerves grossly intact. Moves all 4 extremities w/o difficulty. Affect pleasant.  ECG: SR 95 bpm, no ectopy (personally reviewed).  ASSESSMENT & PLAN: 1.  Chronic systolic CHF: Echo this admission reviewed, EF 20-25% range with severe RV dysfunction. 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 HIV negative. He drinks, but not markedly heavily (pint/week liquor). He apparently had poorly controlled HTN prior to admission (not taking meds for about 9 months). 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 RHCon 2/28/22showed elevated filling pressures especially on the right side, also low CI at 1.3. cMRI with LV EF 23%, RVEF 22%, nonspecific RV insertion LGE.  Excellent diuresis, weight down 20 lbs (50 lbs total).   - NYHA II, physically limited by amputated toes and foot pain. Volume up slightly  on exam, although weight stable. - Increase torsemide 60 mg daily. May be able to back down at next visit w/ increase in [place]. - Increase Entresto to 97/103 mg bid.  BMET today, repeat 7-10 days. - Continue Jardiance 10 mg daily.  - Continue spironolactone 25 mg daily.  -Continuedigoxin 0.125 daily. Dig level today. - Consider adding Toprol at next visit. - Suspect he would not be compliant with Bidil at home.  - Given social situation and ETOH abuse, he would not be a good candidate for advanced therapies at this time.  - I suspect ETOH played a major role in CMP.  We discussed cessation of ETOH today.  2. CKD: Stage 3a. 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 BMET today. 3. HTN: BP stable today, increase Entresto as above. 4. PVCs: Would avoid high dose Toprol XL for now with recent low output/volume overload.  - Continue amiodarone 200 mg bid to try to control PVCs. 5. ETOH abuse: needs complete cessation.  - Would benefit from Paramedicine assistance as he has trouble with his medications and understanding which medications he needs to take and when; also appears to have poor insight to his disease process and importance of medication  compliance.  - Follow back in 3 weeks with PharmD (consider adding beta blocker) for further medication titration and in 6 weeks with APP.  Allena Katz, FNP-BC 01/12/21

## 2021-01-11 ENCOUNTER — Ambulatory Visit: Payer: Medicaid Other | Admitting: Pharmacist

## 2021-01-12 ENCOUNTER — Other Ambulatory Visit: Payer: Self-pay

## 2021-01-12 ENCOUNTER — Encounter (HOSPITAL_COMMUNITY): Payer: Self-pay

## 2021-01-12 ENCOUNTER — Ambulatory Visit (HOSPITAL_COMMUNITY)
Admit: 2021-01-12 | Discharge: 2021-01-12 | Disposition: A | Discharge status: Home / Self Care | Payer: Medicaid Other | Source: Ambulatory Visit | Attending: Family Medicine | Admitting: Family Medicine

## 2021-01-12 VITALS — BP 136/82 | HR 97 | Wt 195.6 lb

## 2021-01-12 DIAGNOSIS — F17210 Nicotine dependence, cigarettes, uncomplicated: Secondary | ICD-10-CM | POA: Diagnosis not present

## 2021-01-12 DIAGNOSIS — F17290 Nicotine dependence, other tobacco product, uncomplicated: Secondary | ICD-10-CM | POA: Diagnosis not present

## 2021-01-12 DIAGNOSIS — I10 Essential (primary) hypertension: Secondary | ICD-10-CM | POA: Diagnosis not present

## 2021-01-12 DIAGNOSIS — R0602 Shortness of breath: Secondary | ICD-10-CM | POA: Insufficient documentation

## 2021-01-12 DIAGNOSIS — I130 Hypertensive heart and chronic kidney disease with heart failure and stage 1 through stage 4 chronic kidney disease, or unspecified chronic kidney disease: Secondary | ICD-10-CM | POA: Diagnosis not present

## 2021-01-12 DIAGNOSIS — Z833 Family history of diabetes mellitus: Secondary | ICD-10-CM | POA: Insufficient documentation

## 2021-01-12 DIAGNOSIS — I428 Other cardiomyopathies: Secondary | ICD-10-CM | POA: Insufficient documentation

## 2021-01-12 DIAGNOSIS — I493 Ventricular premature depolarization: Secondary | ICD-10-CM | POA: Insufficient documentation

## 2021-01-12 DIAGNOSIS — Z79899 Other long term (current) drug therapy: Secondary | ICD-10-CM | POA: Diagnosis not present

## 2021-01-12 DIAGNOSIS — Z7982 Long term (current) use of aspirin: Secondary | ICD-10-CM | POA: Insufficient documentation

## 2021-01-12 DIAGNOSIS — I2510 Atherosclerotic heart disease of native coronary artery without angina pectoris: Secondary | ICD-10-CM | POA: Diagnosis not present

## 2021-01-12 DIAGNOSIS — I5022 Chronic systolic (congestive) heart failure: Secondary | ICD-10-CM

## 2021-01-12 DIAGNOSIS — Z7984 Long term (current) use of oral hypoglycemic drugs: Secondary | ICD-10-CM | POA: Insufficient documentation

## 2021-01-12 DIAGNOSIS — E1122 Type 2 diabetes mellitus with diabetic chronic kidney disease: Secondary | ICD-10-CM | POA: Diagnosis not present

## 2021-01-12 DIAGNOSIS — N1831 Chronic kidney disease, stage 3a: Secondary | ICD-10-CM | POA: Diagnosis not present

## 2021-01-12 DIAGNOSIS — F1010 Alcohol abuse, uncomplicated: Secondary | ICD-10-CM | POA: Insufficient documentation

## 2021-01-12 LAB — DIGOXIN LEVEL: Digoxin Level: 0.7 ng/mL — ABNORMAL LOW (ref 0.8–2.0)

## 2021-01-12 LAB — BASIC METABOLIC PANEL
Anion gap: 6 (ref 5–15)
BUN: 24 mg/dL — ABNORMAL HIGH (ref 8–23)
CO2: 32 mmol/L (ref 22–32)
Calcium: 9 mg/dL (ref 8.9–10.3)
Chloride: 98 mmol/L (ref 98–111)
Creatinine, Ser: 1.5 mg/dL — ABNORMAL HIGH (ref 0.61–1.24)
GFR, Estimated: 52 mL/min — ABNORMAL LOW (ref >60)
Glucose, Bld: 112 mg/dL — ABNORMAL HIGH (ref 70–99)
Potassium: 4.4 mmol/L (ref 3.5–5.1)
Sodium: 136 mmol/L (ref 135–145)

## 2021-01-12 MED ORDER — TORSEMIDE 20 MG PO TABS: 60.0000 mg | ORAL_TABLET | Freq: Every day | ORAL | 6 refills | Status: DC

## 2021-01-12 MED ORDER — SACUBITRIL-VALSARTAN 97-103 MG PO TABS: 1.0000 | ORAL_TABLET | Freq: Two times a day (BID) | ORAL | 5 refills | Status: DC

## 2021-01-12 NOTE — Progress Notes (Signed)
Heart and Vascular Care Navigation  01/12/2021  JONAVEN HILGERS 1956-09-25 [id number]  Reason for Referral: CSW received warm hand off from inpatient [facility] CSW regarding pt.  Pt has significant financial concerns with outstanding utility bills.  TOC CSW had already assisted with paying outstanding water bill during hospital stay but he has reported concerns with gas and electric bills.                                                                                                    Assessment: CSW met with pt during clinic visit to discuss.  He has been receiving a large amount of financial assistance from organizations recently.  Reports receiving rental assistance from [employer]- they will apparently be helping with rent for March.  Pt is concerned about how he will manage moving forward as his rent is increasing from $595 to $695 a month starting with March.  Also reports receiving assistance from [employer] in the past.  Pt also believes he has assistance application in with DHHS for utility assistance- TOC CSW had attempted to call his case worker multiple times without success.  Pt showed CSW notice from [place] that he had a cut off notice but it has been delayed till March 31st.  Also showed CSW [employer] notice that shut off was due March 4th if he did not pay outstanding balance of $289.77.  CSW called DHHS and informed that pt has cut off notices- they will put him on emergent follow up list to finalize application process.                                     HRT/VAS Care Coordination    Living arrangements for the past 2 months Apt   Lives with: Self   Home Assistive Devices/Equipment None   DME Agency NA   [place] [facility]      Social History:                                                                             SDOH Screenings   Alcohol Screen: Medium Risk   Last Alcohol Screening Score (AUDIT): 8  Depression (PHQ2-9): Medium  Risk   PHQ-2 Score: 5  Financial Resource Strain: High Risk   Difficulty of Paying Living Expenses: Very hard  Food Insecurity: Food Insecurity Present   Worried About Charity fundraiser in the Last Year: Sometimes true   Ran Out of Food in the Last Year: Sometimes true  Housing: High Risk   Last Housing Risk Score: 2  Physical Activity: Inactive   Days of Exercise per Week: 0 days   Minutes of Exercise per Session: 0 min  Social Connections: Not on file  Stress: Not on file  Tobacco Use: High Risk   Smoking Tobacco Use: Current Every Day Smoker   Smokeless Tobacco Use: Former Soil scientist Needs: Public librarian (Medical): Yes   Lack of Transportation (Non-Medical): Yes    SDOH Interventions: Financial Resources:  Financial Strain Interventions: Other (Comment) ([employer] for utility assistance) Gets SSI- [phone]  Food Insecurity:   none reported- gets food stamps  Housing Insecurity:   worried about ability to pay rent in the future now that rent has increased by $100/month  Transportation:    drove to appt today    Other Care Navigation Interventions:     Inpatient/Outpatient Substance Abuse Counseling/Rehab Options Not discussed at this time  Provided Pharmacy assistance resources  n/a  Patient expressed Mental Health concerns No.   Follow-up plan:    DHHS to call pt to finalize emergency assistance application for pt utilities.  Will continue to follow and assist as needed  Jorge Ny, [facility] Desk#: [phone] Cell#: [phone]

## 2021-01-12 NOTE — Addendum Note (Signed)
Encounter addended by: Burna Sis, LCSW on: 01/12/2021 2:30 PM  Actions taken: Flowsheet accepted, Clinical Note Signed

## 2021-01-12 NOTE — Patient Instructions (Signed)
INCREASE Entresto to 97/103mg  (1 tab) twice a day  INCREASE Torsemide to 60mg  (3 tabs) daily  Labs today We will only contact you if something comes back abnormal or we need to make some changes. Otherwise no news is good news!  You have been referred to the paramedicine program that will help you manage your medications at home.   Your physician recommends that you schedule a follow-up appointment in: 3 weeks with the pharmacist and 6 weeks with the Nurse Practitioner/Physician Assistant.   Please call office at [phone] option 2 if you have any questions or concerns.   At the Advanced Heart Failure Clinic, you and your health needs are our priority. As part of our continuing mission to provide you with exceptional heart care, we have created designated Provider Care Teams. These Care Teams include your primary Cardiologist (physician) and Advanced Practice Providers (APPs- Physician Assistants and Nurse Practitioners) who all work together to provide you with the care you need, when you need it.   You may see any of the following providers on your designated Care Team at your next follow up: [phone] Dr Marland Kitchen . Dr Arvilla Meres . Dr Marca Ancona . Thornell Mule, NP . Tonye Becket, PA . Robbie Lis [place],NP . Shanda Bumps, PharmD   Please be sure to bring in all your medications bottles to every appointment.

## 2021-01-13 ENCOUNTER — Other Ambulatory Visit: Payer: Self-pay | Admitting: *Deleted

## 2021-01-13 NOTE — Patient Instructions (Signed)
Visit Information  [name]  - as a part of your Medicaid benefit, you are eligible for care management and care coordination services at no cost or copay. I was unable to reach you by phone today but would be happy to help you with your health related needs. Please feel free to call me @ [phone].   A member of the Managed Medicaid care management team will reach out to you again over the next 7-14 days.   Mariona Scholes RN, BSN [place]  [facility] RN Care Coordinator     

## 2021-01-13 NOTE — Patient Outreach (Signed)
Care Coordination  01/13/2021  Scott Greer Mar 25, 1956 [id number]    Medicaid Managed Care   Unsuccessful Outreach Note  01/13/2021 Name: Scott Greer MRN: [id number] DOB: Jun 20, 1956  Referred by: [name], MD Reason for referral : High Risk Managed Medicaid (Unsuccessful RNCM initial telephone outreach)   An unsuccessful telephone outreach was attempted today. The patient was referred to the case management team for assistance with care management and care coordination.   Follow Up Plan: A HIPAA compliant phone message was left for the patient providing contact information and requesting a return call.  The care management team will reach out to the patient again over the next 7-14 days.   Estanislado Emms RN, BSN [place]  [facility]

## 2021-01-17 ENCOUNTER — Telehealth: Payer: Self-pay | Admitting: Internal Medicine

## 2021-01-17 NOTE — Telephone Encounter (Signed)
Attempted to reach Scott Greer today to get him rescheduled for his phone appt that he missed on 01/13/21 with the Managed Medicaid Nurse Case Manager. I left my name and number for him to return my call.

## 2021-01-18 ENCOUNTER — Telehealth (HOSPITAL_COMMUNITY): Payer: Self-pay | Admitting: Licensed Clinical Social Worker

## 2021-01-18 NOTE — Telephone Encounter (Signed)
CSW received email from pt [employer] rental assistance program that they have spoken to  The supervisor for the [employer] and that pt has received maximum support from them at this time (total of 12 months).  Pt would need to pursue other options- suggested applying for LIEAP and Crisis Intervention Programs.  CSW attempted to call pt to discuss but unable to reach or leave VM- will continue to attempt.  Burna Sis, LCSW Clinical Social Worker Advanced [facility] Failure Clinic Desk#: [phone] Cell#: [phone]

## 2021-01-19 ENCOUNTER — Telehealth (HOSPITAL_COMMUNITY): Payer: Self-pay | Admitting: Licensed Clinical Social Worker

## 2021-01-19 ENCOUNTER — Ambulatory Visit: Payer: Medicaid Other

## 2021-01-19 ENCOUNTER — Other Ambulatory Visit (HOSPITAL_COMMUNITY): Payer: Medicaid Other

## 2021-01-19 NOTE — Telephone Encounter (Signed)
CSW spoke with pt and informed that he has maxed out his [employer] assistance and was directed to apply for assistance with DHHS for LIEAP and Crisis assistance.  Pt gas has been turned off so he his hopeful they can assist with getting it turned back on.  He is planning to go to [facility] today to apply.  Will continue to follow and assist as needed  Burna Sis, LCSW Clinical Social Worker Advanced [facility] Clinic Desk#: [phone] Cell#: [phone]

## 2021-01-20 ENCOUNTER — Telehealth (HOSPITAL_COMMUNITY): Payer: Self-pay | Admitting: Licensed Clinical Social Worker

## 2021-01-20 NOTE — Progress Notes (Signed)
Heart and Vascular Care Navigation  01/20/2021  HURBERT Greer 11-30-1955 [id number]  Reason for Referral:  Pt calling with concerns about his gas being shut off at this time.                                                                                                    Assessment:   Pt reports his gas has been off for several days and that he was told by [employer] that they hadn't disconnected services and he should call his apartment complex for a repair man.  Pt called his apartment site but feels like he was told he had to complete paperwork for it to be done and he was very overwhelmed.  CSW reached out to property management company who reports they have put in a work order for repair and that pt would receive a call from a repairman when they are able to go out.  Pt then stated that his medication box was almost out and he had no idea how to take his medications or what dose he is supposed to take after recent med changes.  CSW spoke with clinic staff who agreed to help with pt pillbox if he comes to clinic.  Pt came to clinic and triage CMA filled pill box appropriately.  CSW sent message to MD to inquire about adding to paramedicine as pt does not have good understanding of his medications or how to take them- MD in agreement- CSW to place referral.  While in clinic pt expressing continuing concerns with paying bills.  CSW provided with LIEAP and LIWAP applications along with list of required documentation list- pt to gather documents then come to meet with CSW so that we can fill out applications together.  Pt then complained about new blister on his foot which appears to be on his toe amputation stump site- states his shoe cushion has been rubbing it.  Pt reports intense pain and was moving very poorly when he arrived at clinic.  CSW helped call pt PCP but they are unable to get pt in early for an appt- they recommend pt go to mobile clinic and provided CSW with hours.  CSW  then called [facility] who met with pt in February- they can see pt Monday at 8:45am- pt provided with appt details and plans to go to appt.                                  HRT/VAS Care Coordination    Patients Home Cardiology Office Heart Failure Clinic   Outpatient Care Team Social Worker   Social Worker Name: Tammy Sours- Advanced Heart Failure- [phone]   Living arrangements for the past 2 months Apartment   Lives with: Self   Home Assistive Devices/Equipment None   DME Agency NA   [facility]      Social History:  SDOH Screenings   Alcohol Screen: Medium Risk  . Last Alcohol Screening Score (AUDIT): 8  Depression (PHQ2-9): Medium Risk  . PHQ-2 Score: 5  Financial Resource Strain: High Risk  . Difficulty of Paying Living Expenses: Very hard  Food Insecurity: Food Insecurity Present  . Worried About Charity fundraiser in the Last Year: Sometimes true  . Ran Out of Food in the Last Year: Sometimes true  Housing: High Risk  . Last Housing Risk Score: 2  Physical Activity: Inactive  . Days of Exercise per Week: 0 days  . Minutes of Exercise per Session: 0 min  Social Connections: Not on file  Stress: Not on file  Tobacco Use: High Risk  . Smoking Tobacco Use: Current Every Day Smoker  . Smokeless Tobacco Use: Former Soil scientist Needs: Higher education careers adviser  . Lack of Transportation (Medical): Yes  . Lack of Transportation (Non-Medical): Yes    SDOH Interventions: Financial Resources:   referred for [employer] assistance programs   Food Insecurity:   none reported during this visit  Housing Insecurity:   concerns with being able to continue paying for housing with rent increase to $685  Transportation:         Follow-up plan:    Pt to work on [employer] applications  Pt to follow up with ortho on Monday regarding wound on amputation site  CSW to refer to  paramedicine when paramedics are available again next week.  Will continue to follow and assist as needed  Jorge Ny, [place] Desk#: [phone] Cell#: [phone]

## 2021-01-23 ENCOUNTER — Other Ambulatory Visit: Payer: Self-pay | Admitting: Physician Assistant

## 2021-01-23 ENCOUNTER — Telehealth (HOSPITAL_COMMUNITY): Payer: Self-pay | Admitting: Licensed Clinical Social Worker

## 2021-01-23 ENCOUNTER — Encounter: Payer: Self-pay | Admitting: Orthopedic Surgery

## 2021-01-23 ENCOUNTER — Ambulatory Visit (INDEPENDENT_AMBULATORY_CARE_PROVIDER_SITE_OTHER): Payer: Medicaid Other | Admitting: Orthopedic Surgery

## 2021-01-23 ENCOUNTER — Other Ambulatory Visit (HOSPITAL_COMMUNITY): Payer: Medicaid Other

## 2021-01-23 ENCOUNTER — Telehealth: Payer: Self-pay | Admitting: Internal Medicine

## 2021-01-23 DIAGNOSIS — M869 Osteomyelitis, unspecified: Secondary | ICD-10-CM | POA: Diagnosis not present

## 2021-01-23 MED ORDER — OXYCODONE-ACETAMINOPHEN 5-325 MG PO TABS: 1.0000 | ORAL_TABLET | ORAL | 0 refills | Status: DC | PRN

## 2021-01-23 NOTE — Telephone Encounter (Signed)
Pt being referred to [employer] program to assist with medication compliance and management at home.  Referral sent to paramedics- will be followed by Geraldine [employer]- per notes pt to come in for toe amputation on 3/23- will have paramedic follow up with pt next week following surgery  Burna Sis, LCSW Clinical Social Worker [facility] Clinic Desk#: [phone] Cell#: [phone]

## 2021-01-23 NOTE — Progress Notes (Signed)
Office Visit Note   Patient: Scott Greer           Date of Birth: July 21, 1956           MRN: [id number] Visit Date: 01/23/2021              Requested by: [name], MD [address] [place],  [place] [address] PCP: [name], MD  Chief Complaint  Patient presents with  . Lower Back - Wound Check    2nd toe      HPI: Patient is a 65 year old gentleman who is status post a left foot first ray amputation who has developed ulceration abscess and infection of the second toe left foot.  Patient states he has been soaking his foot and peroxide.  Patient states that his pain is a 10 out of 10.  Assessment & Plan: Visit Diagnoses:  1. Osteomyelitis of second toe of left foot (HCC)     Plan: Recommended proceeding with a second toe amputation of the left foot.  Discussed the patient has increased risk of the wound not healing need for additional surgery.  Patient states he understands wished to proceed at this time.  Follow-Up Instructions: Return in about 1 week (around 01/30/2021).   Ortho Exam  Patient is alert, oriented, no adenopathy, well-dressed, normal affect, normal respiratory effort. Examination patient does not have palpable pulses the Doppler shows he has a biphasic dorsalis pedis and posterior tibial pulse patient has significant microcirculatory disease with ischemic pain he has a necrotic ulcer over the dorsum of the PIP joint of the second toe with exposed bone and tendon.  Imaging: No results found. No images are attached to the encounter.  Labs: Lab Results  Component Value Date   HGBA1C 8.4 (A) 12/14/2020   HGBA1C 7.1 (A) 06/20/2020   HGBA1C 8.9 (A) 10/23/2019   ESRSEDRATE 78 (H) 06/04/2019   CRP 4.5 (H) 06/04/2019   REPTSTATUS 06/10/2019 FINAL 06/05/2019   CULT  06/05/2019    NO GROWTH 5 DAYS Performed at [facility], [address], [place], [place] [address]      Lab Results  Component Value Date   ALBUMIN 3.1 (L)  12/25/2020   ALBUMIN 3.7 (L) 12/14/2020   ALBUMIN 3.8 10/23/2019   PREALBUMIN 13.8 (L) 06/04/2019    Lab Results  Component Value Date   MG 2.0 01/05/2021   MG 2.1 01/04/2021   MG 2.0 01/03/2021   Lab Results  Component Value Date   VD25OH 9.2 (L) 12/14/2020    Lab Results  Component Value Date   PREALBUMIN 13.8 (L) 06/04/2019   CBC EXTENDED Latest Ref Rng & Units 01/05/2021 01/04/2021 01/03/2021  WBC 4.0 - 10.5 K/uL 5.5 5.7 6.3  RBC 4.22 - 5.81 MIL/uL 4.44 4.04(L) 4.42  HGB 13.0 - 17.0 g/dL 12.4(L) 11.3(L) 12.1(L)  HCT 39.0 - 52.0 % 35.6(L) 32.8(L) 36.3(L)  PLT 150 - 400 K/uL 260 216 254  NEUTROABS 1.7 - 7.7 K/uL - 3.9 -  LYMPHSABS 0.7 - 4.0 K/uL - 0.9 -     There is no height or weight on file to calculate BMI.  Orders:  No orders of the defined types were placed in this encounter.  No orders of the defined types were placed in this encounter.    Procedures: No procedures performed  Clinical Data: No additional findings.  ROS:  All other systems negative, except as noted in the HPI. Review of Systems  Objective: Vital Signs:  There were no vitals taken for this visit.  Specialty Comments:  No specialty comments available.  PMFS History: Patient Active Problem List   Diagnosis Date Noted  . New onset of congestive heart failure (HCC) 12/26/2020  . CKD (chronic kidney disease) stage 3, GFR 30-59 ml/min (HCC) 12/26/2020  . Acute systolic CHF (congestive heart failure) (HCC) 12/26/2020  . Anemia, chronic disease 06/22/2020  . Amputee, great toe, right (HCC) 06/20/2020  . Normocytic anemia 06/20/2020  . Erectile dysfunction associated with type 2 diabetes mellitus (HCC) 06/20/2020  . Positive for macroalbuminuria 10/24/2019  . Amputation of left great toe (HCC) 10/23/2019  . Tobacco dependence 10/23/2019  . Osteomyelitis of great toe of right foot (HCC) 06/04/2019  . Diabetic foot infection (HCC)   . Noncompliance with medication regimen   . Glaucoma    . Hyperlipidemia   . Essential hypertension 11/06/2003  . Diabetes mellitus (HCC) 11/05/1997   Past Medical History:  Diagnosis Date  . Dehiscence of amputation stump (HCC)    left great toe  . Diabetes mellitus without complication (HCC) 1999   Type II  . Glaucoma 2015  . Hyperlipidemia   . Hypertension 2005  . Osteomyelitis (HCC)   . Wears glasses     Family History  Problem Relation Age of Onset  . Stroke Mother   . Diabetes Mother        Toward end of life    Past Surgical History:  Procedure Laterality Date  . AMPUTATION Left 06/05/2019   Procedure: LEFT GREAT TOE AMPUTATION;  Surgeon: [name], MD;  Location: [facility] OR;  Service: Orthopedics;  Laterality: Left;  . AMPUTATION Left 07/10/2019   Procedure: LEFT FOOT 1ST RAY AMPUTATION;  Surgeon: [name], MD;  Location: [facility] OR;  Service: Orthopedics;  Laterality: Left;  . AMPUTATION Right 05/25/2020   Procedure: RIGHT GREAT TOE AMPUTATION;  Surgeon: [name], MD;  Location: [facility] OR;  Service: Orthopedics;  Laterality: Right;  . NO PAST SURGERIES    . RIGHT/LEFT HEART CATH AND CORONARY ANGIOGRAPHY N/A 01/02/2021   Procedure: RIGHT/LEFT HEART CATH AND CORONARY ANGIOGRAPHY;  Surgeon: Runell Gess, MD;  Location: [facility] INVASIVE CV LAB;  Service: Cardiovascular;  Laterality: N/A;   Social History   Occupational History  . Not on file  Tobacco Use  . Smoking status: Current Every Day Smoker    Types: Cigars, Cigarettes  . Smokeless tobacco: Former Neurosurgeon    Types: Chew  . Tobacco comment: 6 daily  Vaping Use  . Vaping Use: Never used  Substance and Sexual Activity  . Alcohol use: Yes    Alcohol/week: 11.0 standard drinks    Types: 3 Cans of beer, 8 Shots of liquor per week    Comment: occasional  . Drug use: Yes    Types: Marijuana    Comment: ocassional - last time 05/08/2020  . Sexual activity: Not Currently

## 2021-01-23 NOTE — Telephone Encounter (Signed)
I left a message on Scott Greer's voicemail today in reference to his missed phone appt on 01/13/21. I asked him to please call me back so we could get him rescheduled with the [facility] on the Managed Medicaid Team.

## 2021-01-24 ENCOUNTER — Encounter (HOSPITAL_COMMUNITY): Payer: Self-pay | Admitting: Orthopedic Surgery

## 2021-01-24 NOTE — Progress Notes (Signed)
[name] denies chest pain or shortness of breath. [name] did not have Covid test today.  [name] has type II diabetes, patient said he no longer. I asked [name] to check CBG in am every 2 hours until  he leaves to come to the hospital. I instructed patient if CBG. I informed [name] to cover CBG < 70 with 1/2 cup cranberry or apple juice.  [name] states that he does not have any juice. I encouraged patient to eat a snack before bedtime and if CBG <  70 to call the  PRE- Op desk for instructions.  [name] could not tell me what  Medications, he takes, patient has the medications in a bag and will bring the bag in tomrrow. I instructed patient to not take any medication in the am.  [name] states he may not have anyone to stay with him for the first 24 hours after surgery.  I encouraged [name] to work on it - if he does not have anyone to stay to let staff know when he arrives in am.

## 2021-01-25 ENCOUNTER — Ambulatory Visit (HOSPITAL_COMMUNITY): Payer: Medicaid Other | Admitting: Anesthesiology

## 2021-01-25 ENCOUNTER — Ambulatory Visit (HOSPITAL_COMMUNITY)
Admission: RE | Admit: 2021-01-25 | Discharge: 2021-01-25 | Disposition: A | Discharge status: Home / Self Care | Payer: Medicaid Other | Source: Ambulatory Visit | Attending: Orthopedic Surgery | Admitting: Orthopedic Surgery

## 2021-01-25 ENCOUNTER — Encounter (HOSPITAL_COMMUNITY): Payer: Self-pay | Admitting: Orthopedic Surgery

## 2021-01-25 ENCOUNTER — Other Ambulatory Visit: Payer: Self-pay

## 2021-01-25 ENCOUNTER — Encounter (HOSPITAL_COMMUNITY)
Admission: RE | Disposition: A | Discharge status: Home / Self Care | Payer: Self-pay | Source: Ambulatory Visit | Attending: Orthopedic Surgery

## 2021-01-25 DIAGNOSIS — I10 Essential (primary) hypertension: Secondary | ICD-10-CM | POA: Insufficient documentation

## 2021-01-25 DIAGNOSIS — F17290 Nicotine dependence, other tobacco product, uncomplicated: Secondary | ICD-10-CM | POA: Diagnosis not present

## 2021-01-25 DIAGNOSIS — Z89411 Acquired absence of right great toe: Secondary | ICD-10-CM | POA: Diagnosis not present

## 2021-01-25 DIAGNOSIS — M869 Osteomyelitis, unspecified: Secondary | ICD-10-CM | POA: Diagnosis not present

## 2021-01-25 DIAGNOSIS — Z833 Family history of diabetes mellitus: Secondary | ICD-10-CM | POA: Diagnosis not present

## 2021-01-25 DIAGNOSIS — E1152 Type 2 diabetes mellitus with diabetic peripheral angiopathy with gangrene: Secondary | ICD-10-CM | POA: Insufficient documentation

## 2021-01-25 DIAGNOSIS — E785 Hyperlipidemia, unspecified: Secondary | ICD-10-CM | POA: Insufficient documentation

## 2021-01-25 DIAGNOSIS — Z89412 Acquired absence of left great toe: Secondary | ICD-10-CM | POA: Insufficient documentation

## 2021-01-25 DIAGNOSIS — Z20822 Contact with and (suspected) exposure to covid-19: Secondary | ICD-10-CM | POA: Insufficient documentation

## 2021-01-25 DIAGNOSIS — H409 Unspecified glaucoma: Secondary | ICD-10-CM | POA: Insufficient documentation

## 2021-01-25 DIAGNOSIS — E1169 Type 2 diabetes mellitus with other specified complication: Secondary | ICD-10-CM | POA: Diagnosis not present

## 2021-01-25 HISTORY — PX: AMPUTATION: SHX166

## 2021-01-25 LAB — CBC
HCT: 37.8 % — ABNORMAL LOW (ref 39.0–52.0)
Hemoglobin: 12.3 g/dL — ABNORMAL LOW (ref 13.0–17.0)
MCH: 26.6 pg (ref 26.0–34.0)
MCHC: 32.5 g/dL (ref 30.0–36.0)
MCV: 81.8 fL (ref 80.0–100.0)
Platelets: 409 10*3/uL — ABNORMAL HIGH (ref 150–400)
RBC: 4.62 MIL/uL (ref 4.22–5.81)
RDW: 15.9 % — ABNORMAL HIGH (ref 11.5–15.5)
WBC: 22.2 10*3/uL — ABNORMAL HIGH (ref 4.0–10.5)
nRBC: 0 % (ref 0.0–0.2)

## 2021-01-25 LAB — GLUCOSE, CAPILLARY
Glucose-Capillary: 163 mg/dL — ABNORMAL HIGH (ref 70–99)
Glucose-Capillary: 184 mg/dL — ABNORMAL HIGH (ref 70–99)
Glucose-Capillary: 168 mg/dL — ABNORMAL HIGH (ref 70–99)

## 2021-01-25 LAB — SARS CORONAVIRUS 2 BY RT PCR (HOSPITAL ORDER, PERFORMED IN ~~LOC~~ HOSPITAL LAB): SARS Coronavirus 2: NEGATIVE

## 2021-01-25 SURGERY — AMPUTATION DIGIT
Anesthesia: Monitor Anesthesia Care | Site: Foot | Laterality: Left

## 2021-01-25 MED ORDER — MIDAZOLAM HCL 2 MG/2ML IJ SOLN
INTRAMUSCULAR | Status: AC
  Administered 2021-01-25: 1 mg via INTRAVENOUS
  Filled 2021-01-25: qty 2

## 2021-01-25 MED ORDER — ONDANSETRON HCL 4 MG/2ML IJ SOLN
INTRAMUSCULAR | Status: AC
  Filled 2021-01-25: qty 2

## 2021-01-25 MED ORDER — MIDAZOLAM HCL 5 MG/5ML IJ SOLN
INTRAMUSCULAR | Status: DC | PRN
  Administered 2021-01-25 (×2): 1 mg via INTRAVENOUS

## 2021-01-25 MED ORDER — LACTATED RINGERS IV SOLN: INTRAVENOUS | Status: DC

## 2021-01-25 MED ORDER — OXYCODONE HCL 5 MG PO TABS: 5.0000 mg | ORAL_TABLET | Freq: Once | ORAL | Status: DC | PRN

## 2021-01-25 MED ORDER — MIDAZOLAM HCL 2 MG/2ML IJ SOLN: 1.0000 mg | Freq: Once | INTRAMUSCULAR | Status: AC

## 2021-01-25 MED ORDER — 0.9 % SODIUM CHLORIDE (POUR BTL) OPTIME
TOPICAL | Status: DC | PRN
  Administered 2021-01-25: 1000 mL

## 2021-01-25 MED ORDER — LIDOCAINE 2% (20 MG/ML) 5 ML SYRINGE
INTRAMUSCULAR | Status: AC
  Filled 2021-01-25: qty 5

## 2021-01-25 MED ORDER — ONDANSETRON HCL 4 MG/2ML IJ SOLN
INTRAMUSCULAR | Status: DC | PRN
  Administered 2021-01-25: 4 mg via INTRAVENOUS

## 2021-01-25 MED ORDER — DOXYCYCLINE HYCLATE 100 MG PO TABS: 100.0000 mg | ORAL_TABLET | Freq: Two times a day (BID) | ORAL | 0 refills | Status: DC

## 2021-01-25 MED ORDER — PROPOFOL 10 MG/ML IV BOLUS
INTRAVENOUS | Status: DC | PRN
  Administered 2021-01-25: 20 mg via INTRAVENOUS
  Administered 2021-01-25: 50 mg via INTRAVENOUS

## 2021-01-25 MED ORDER — BUPIVACAINE-EPINEPHRINE (PF) 0.5% -1:200000 IJ SOLN
INTRAMUSCULAR | Status: DC | PRN
  Administered 2021-01-25: 30 mL via PERINEURAL

## 2021-01-25 MED ORDER — ONDANSETRON HCL 4 MG/2ML IJ SOLN: 4.0000 mg | Freq: Four times a day (QID) | INTRAMUSCULAR | Status: DC | PRN

## 2021-01-25 MED ORDER — OXYCODONE HCL 5 MG/5ML PO SOLN: 5.0000 mg | Freq: Once | ORAL | Status: DC | PRN

## 2021-01-25 MED ORDER — CEFAZOLIN SODIUM-DEXTROSE 2-4 GM/100ML-% IV SOLN
2.0000 g | INTRAVENOUS | Status: AC
  Administered 2021-01-25: 2 g via INTRAVENOUS
  Filled 2021-01-25: qty 100

## 2021-01-25 MED ORDER — FENTANYL CITRATE (PF) 100 MCG/2ML IJ SOLN
INTRAMUSCULAR | Status: DC | PRN
  Administered 2021-01-25: 50 ug via INTRAVENOUS

## 2021-01-25 MED ORDER — CHLORHEXIDINE GLUCONATE 0.12 % MT SOLN
15.0000 mL | Freq: Once | OROMUCOSAL | Status: AC
  Administered 2021-01-25: 15 mL via OROMUCOSAL
  Filled 2021-01-25: qty 15

## 2021-01-25 MED ORDER — DIGOXIN 125 MCG PO TABS
0.1250 mg | ORAL_TABLET | Freq: Once | ORAL | Status: AC
  Administered 2021-01-25: 0.125 mg via ORAL
  Filled 2021-01-25: qty 1

## 2021-01-25 MED ORDER — FENTANYL CITRATE (PF) 100 MCG/2ML IJ SOLN: 25.0000 ug | INTRAMUSCULAR | Status: DC | PRN

## 2021-01-25 MED ORDER — MIDAZOLAM HCL 2 MG/2ML IJ SOLN
INTRAMUSCULAR | Status: AC
  Filled 2021-01-25: qty 2

## 2021-01-25 MED ORDER — FENTANYL CITRATE (PF) 100 MCG/2ML IJ SOLN
INTRAMUSCULAR | Status: AC
  Administered 2021-01-25: 50 ug via INTRAVENOUS
  Filled 2021-01-25: qty 2

## 2021-01-25 MED ORDER — CHLORHEXIDINE GLUCONATE 0.12 % MT SOLN
OROMUCOSAL | Status: AC
  Filled 2021-01-25: qty 15

## 2021-01-25 MED ORDER — AMIODARONE HCL 200 MG PO TABS
200.0000 mg | ORAL_TABLET | Freq: Once | ORAL | Status: AC
  Administered 2021-01-25: 200 mg via ORAL
  Filled 2021-01-25: qty 1

## 2021-01-25 MED ORDER — ORAL CARE MOUTH RINSE: 15.0000 mL | Freq: Once | OROMUCOSAL | Status: AC

## 2021-01-25 MED ORDER — FENTANYL CITRATE (PF) 100 MCG/2ML IJ SOLN: 50.0000 ug | Freq: Once | INTRAMUSCULAR | Status: AC

## 2021-01-25 MED ORDER — PROPOFOL 10 MG/ML IV BOLUS
INTRAVENOUS | Status: AC
  Filled 2021-01-25: qty 20

## 2021-01-25 MED ORDER — FENTANYL CITRATE (PF) 250 MCG/5ML IJ SOLN
INTRAMUSCULAR | Status: AC
  Filled 2021-01-25: qty 5

## 2021-01-25 SURGICAL SUPPLY — 32 items
BLADE SURG 21 STRL SS (BLADE) ×2 IMPLANT
BNDG CMPR 9X4 STRL LF SNTH (GAUZE/BANDAGES/DRESSINGS)
BNDG COHESIVE 4X5 TAN STRL (GAUZE/BANDAGES/DRESSINGS) ×2 IMPLANT
BNDG COHESIVE 6X5 TAN NS LF (GAUZE/BANDAGES/DRESSINGS) ×2 IMPLANT
BNDG ESMARK 4X9 LF (GAUZE/BANDAGES/DRESSINGS) IMPLANT
BNDG GAUZE ELAST 4 BULKY (GAUZE/BANDAGES/DRESSINGS) ×2 IMPLANT
COVER SURGICAL LIGHT HANDLE (MISCELLANEOUS) ×2 IMPLANT
COVER WAND RF STERILE (DRAPES) ×2 IMPLANT
DRAPE U-SHAPE 47X51 STRL (DRAPES) ×2 IMPLANT
DRSG ADAPTIC 3X8 NADH LF (GAUZE/BANDAGES/DRESSINGS) ×2 IMPLANT
DRSG PAD ABDOMINAL 8X10 ST (GAUZE/BANDAGES/DRESSINGS) ×2 IMPLANT
DURAPREP 26ML APPLICATOR (WOUND CARE) ×2 IMPLANT
ELECT REM PT RETURN 9FT ADLT (ELECTROSURGICAL) ×2
ELECTRODE REM PT RTRN 9FT ADLT (ELECTROSURGICAL) ×1 IMPLANT
GAUZE SPONGE 4X4 12PLY STRL (GAUZE/BANDAGES/DRESSINGS) IMPLANT
GAUZE SPONGE 4X4 12PLY STRL LF (GAUZE/BANDAGES/DRESSINGS) ×2 IMPLANT
GLOVE BIOGEL PI IND STRL 9 (GLOVE) ×1 IMPLANT
GLOVE BIOGEL PI INDICATOR 9 (GLOVE) ×1
GLOVE SURG ORTHO 9.0 STRL STRW (GLOVE) ×2 IMPLANT
GOWN STRL REUS W/ TWL XL LVL3 (GOWN DISPOSABLE) ×2 IMPLANT
GOWN STRL REUS W/TWL XL LVL3 (GOWN DISPOSABLE) ×4
KIT BASIN OR (CUSTOM PROCEDURE TRAY) ×2 IMPLANT
KIT TURNOVER KIT B (KITS) ×2 IMPLANT
MANIFOLD NEPTUNE II (INSTRUMENTS) ×2 IMPLANT
NEEDLE 22X1 1/2 (OR ONLY) (NEEDLE) IMPLANT
NS IRRIG 1000ML POUR BTL (IV SOLUTION) ×2 IMPLANT
PACK ORTHO EXTREMITY (CUSTOM PROCEDURE TRAY) ×2 IMPLANT
PAD ABD 8X10 STRL (GAUZE/BANDAGES/DRESSINGS) ×2 IMPLANT
PAD ARMBOARD 7.5X6 YLW CONV (MISCELLANEOUS) ×2 IMPLANT
SUT ETHILON 2 0 PSLX (SUTURE) ×2 IMPLANT
SYR CONTROL 10ML LL (SYRINGE) IMPLANT
TOWEL GREEN STERILE (TOWEL DISPOSABLE) ×2 IMPLANT

## 2021-01-25 NOTE — Interval H&P Note (Signed)
History and Physical Interval Note:  01/25/2021 1:30 PM  Scott Greer  has presented today for surgery, with the diagnosis of Left 2nd Toe Gangrene.  The various methods of treatment have been discussed with the patient and family. After consideration of risks, benefits and other options for treatment, the patient has consented to  Procedure(s): LEFT 2ND TOE AMPUTATION (Left) as a surgical intervention.  The patient's history has been reviewed, patient examined, no change in status, stable for surgery.  I have reviewed the patient's chart and labs.  Questions were answered to the patient's satisfaction.     Scott Greer

## 2021-01-25 NOTE — Anesthesia Preprocedure Evaluation (Signed)
Anesthesia Evaluation  Patient identified by MRN, date of birth, ID band Patient awake    Reviewed: Allergy & Precautions, H&P , NPO status , Patient's Chart, lab work & pertinent test results  Airway Mallampati: II   Neck ROM: full    Dental   Pulmonary Current Smoker,    breath sounds clear to auscultation       Cardiovascular hypertension,  Rhythm:regular Rate:Normal     Neuro/Psych    GI/Hepatic   Endo/Other  diabetes, Type 2  Renal/GU Renal InsufficiencyRenal disease     Musculoskeletal   Abdominal   Peds  Hematology   Anesthesia Other Findings   Reproductive/Obstetrics                             Anesthesia Physical Anesthesia Plan  ASA: III  Anesthesia Plan: Regional and MAC   Post-op Pain Management:    Induction: Intravenous  PONV Risk Score and Plan: 0 and Propofol infusion, Treatment may vary due to age or medical condition, Midazolam and Ondansetron  Airway Management Planned: Simple Face Mask  Additional Equipment:   Intra-op Plan:   Post-operative Plan:   Informed Consent: I have reviewed the patients History and Physical, chart, labs and discussed the procedure including the risks, benefits and alternatives for the proposed anesthesia with the patient or authorized representative who has indicated his/her understanding and acceptance.     Dental advisory given  Plan Discussed with: CRNA, Anesthesiologist and Surgeon  Anesthesia Plan Comments:         Anesthesia Quick Evaluation

## 2021-01-25 NOTE — Op Note (Signed)
01/25/2021  2:28 PM  PATIENT:  Scott Greer    PRE-OPERATIVE DIAGNOSIS:  Left 2nd Toe Gangrene  POST-OPERATIVE DIAGNOSIS:  Same  PROCEDURE:  LEFT 2ND RAY AMPUTATION Local tissue rearrangement for wound closure 7 x 3 cm.  SURGEON:  [name], MD  PHYSICIAN ASSISTANT:None ANESTHESIA:   General  PREOPERATIVE INDICATIONS:  Scott Greer is a  65 y.o. male with a diagnosis of Left 2nd Toe Gangrene who failed conservative measures and elected for surgical management.    The risks benefits and alternatives were discussed with the patient preoperatively including but not limited to the risks of infection, bleeding, nerve injury, cardiopulmonary complications, the need for revision surgery, among others, and the patient was willing to proceed.  OPERATIVE IMPLANTS: None  @ENCIMAGES @  OPERATIVE FINDINGS: Calcified vessels with minimal petechial bleeding  OPERATIVE PROCEDURE: Patient was brought the operating room and underwent a regional anesthetic.  After adequate levels anesthesia were obtained patient's left lower extremity was prepped using DuraPrep draped into a sterile field a timeout was called.  A racquet incision was made around the ulcerative necrotic second toe the nonviable tissue extended to the metatarsal head and the second metatarsal was resected through the shaft to resect the nonviable tissue with the metatarsal head.  Further debridement was performed the soft tissue to get back to healthy viable margins.  The wound was irrigated with normal saline.  Local tissue rearrangement was used to close a wound that was 3 x 7 cm.  Incision was closed using 2-0 nylon.  A sterile dressing was applied patient was taken the PACU in stable condition   DISCHARGE PLANNING:  Antibiotic duration: Prescription for oral antibiotics  Weightbearing: Touchdown weightbearing on the left  Pain medication: Percocet prescription  Dressing care/ Wound VAC: Dry dressing  Ambulatory devices:  Crutches  Discharge to: Home  Follow-up: In the office 1 week post operative.

## 2021-01-25 NOTE — Anesthesia Postprocedure Evaluation (Signed)
Anesthesia Post Note  Patient: Scott Greer  Procedure(s) Performed: LEFT 2ND TOE AMPUTATION (Left Foot)     Patient location during evaluation: PACU Anesthesia Type: Regional Level of consciousness: awake and alert Pain management: pain level controlled Vital Signs Assessment: post-procedure vital signs reviewed and stable Respiratory status: spontaneous breathing and respiratory function stable Cardiovascular status: stable Postop Assessment: no apparent nausea or vomiting Anesthetic complications: no   No complications documented.  Last Vitals:  Vitals:   01/25/21 1500 01/25/21 1515  BP: 90/66 93/66  Pulse: 84 82  Resp: 16 15  Temp:    SpO2: 94% 95%    Last Pain:  Vitals:   01/25/21 1515  TempSrc:   PainSc: 0-No pain                 Tima Curet DANIEL

## 2021-01-25 NOTE — Transfer of Care (Signed)
Immediate Anesthesia Transfer of Care Note  Patient: Scott Greer  Procedure(s) Performed: LEFT 2ND TOE AMPUTATION (Left Foot)  Patient Location: PACU  Anesthesia Type:MAC  Level of Consciousness: drowsy, patient cooperative and responds to stimulation  Airway & Oxygen Therapy: Patient Spontanous Breathing and Patient connected to nasal cannula oxygen  Post-op Assessment: Report given to RN and Post -op Vital signs reviewed and stable  Post vital signs: Reviewed and stable  Last Vitals:  Vitals Value Taken Time  BP 94/64 01/25/21 1431  Temp    Pulse 84 01/25/21 1432  Resp 14 01/25/21 1432  SpO2 99 % 01/25/21 1432  Vitals shown include unvalidated device data.  Last Pain:  Vitals:   01/25/21 1010  TempSrc:   PainSc: 10-Worst pain ever         Complications: No complications documented.

## 2021-01-25 NOTE — Anesthesia Procedure Notes (Signed)
Anesthesia Regional Block: Popliteal block   Pre-Anesthetic Checklist: ,, timeout performed, Correct Patient, Correct Site, Correct Laterality, Correct Procedure, Correct Position, site marked, Risks and benefits discussed,  Surgical consent,  Pre-op evaluation,  At surgeon's request and post-op pain management  Laterality: Left  Prep: chloraprep       Needles:  Injection technique: Single-shot  Needle Type: Echogenic Stimulator Needle          Additional Needles:   Procedures:, nerve stimulator,,,,,,,   Nerve Stimulator or Paresthesia:  Response: plantar flexion of foot, 0.45 mA,   Additional Responses:   Narrative:  Start time: 01/25/2021 1:01 PM End time: 01/25/2021 1:14 PM Injection made incrementally with aspirations every 5 mL.  Performed by: Personally  Anesthesiologist: Achille Rich, MD  Additional Notes: Functioning IV was confirmed and monitors were applied.  A 82mm 21ga Arrow echogenic stimulator needle was used. Sterile prep and drape,hand hygiene and sterile gloves were used.  Negative aspiration and negative test dose prior to incremental administration of local anesthetic. The patient tolerated the procedure well.  Ultrasound guidance: relevent anatomy identified, needle position confirmed, local anesthetic spread visualized around nerve(s), vascular puncture avoided.  Image printed for medical record.

## 2021-01-25 NOTE — H&P (Signed)
Scott Greer is an 65 y.o. male.   Chief Complaint: Left second toe Gangrene HPI: Patient is a 65 year old gentleman who is status post a left foot first ray amputation who has developed ulceration abscess and infection of the second toe left foot.  Patient states he has been soaking his foot and peroxide.  Patient states that his pain is a 10 out of 10.  Past Medical History:  Diagnosis Date  . Dehiscence of amputation stump (HCC)    left great toe  . Diabetes mellitus without complication (HCC) 1999   Type II  . Glaucoma 2015  . Hyperlipidemia   . Hypertension 2005  . Osteomyelitis (HCC)   . Wears glasses     Past Surgical History:  Procedure Laterality Date  . AMPUTATION Left 06/05/2019   Procedure: LEFT GREAT TOE AMPUTATION;  Surgeon: [name], MD;  Location: [facility] OR;  Service: Orthopedics;  Laterality: Left;  . AMPUTATION Left 07/10/2019   Procedure: LEFT FOOT 1ST RAY AMPUTATION;  Surgeon: [name], MD;  Location: [facility] OR;  Service: Orthopedics;  Laterality: Left;  . AMPUTATION Right 05/25/2020   Procedure: RIGHT GREAT TOE AMPUTATION;  Surgeon: [name], MD;  Location: [facility] OR;  Service: Orthopedics;  Laterality: Right;  . NO PAST SURGERIES    . RIGHT/LEFT HEART CATH AND CORONARY ANGIOGRAPHY N/A 01/02/2021   Procedure: RIGHT/LEFT HEART CATH AND CORONARY ANGIOGRAPHY;  Surgeon: Runell Gess, MD;  Location: [facility] INVAS[facility] LAB;  Service: Cardiovascular;  Laterality: N/A;    Family History  Problem Relation Age of Onset  . Stroke Mother   . Diabetes Mother        Toward end of life   Social History:  reports that he has been smoking cigars and cigarettes. He has quit using smokeless tobacco.  His smokeless tobacco use included chew. He reports current alcohol use of about 11.0 standard drinks of alcohol per week. He reports current drug use. Drug: Marijuana.  Allergies:  Allergies  Allergen Reactions  . Bee Venom Hives, Itching and Swelling    No medications  prior to admission.    No results found for this or any previous visit (from the past 48 hour(s)). No results found.  Review of Systems  All other systems reviewed and are negative.   There were no vitals taken for this visit. Physical Exam   Patient is alert, oriented, no adenopathy, well-dressed, normal affect, normal respiratory effort. Examination patient does not have palpable pulses the Doppler shows he has a biphasic dorsalis pedis and posterior tibial pulse patient has significant microcirculatory disease with ischemic pain he has a necrotic ulcer over the dorsum of the PIP joint of the second toe with exposed bone and tendon.Heart RRR Lungs Clear Assessment/Plan 1. Osteomyelitis of second toe of left foot (HCC)     Plan: Recommended proceeding with a second toe amputation of the left foot.  Discussed the patient has increased risk of the wound not healing need for additional surgery.  Patient states he understands wished to proceed at this time.   [name] Persons, PA 01/25/2021, 6:45 AM

## 2021-01-25 NOTE — Progress Notes (Signed)
Orthopedic Tech Progress Note Patient Details:  BRECCAN GALANT Oct 20, 1956 [id number] PACU RN called requesting a POST OP SHOE Ortho Devices Type of Ortho Device: Postop shoe/boot Ortho Device/Splint Location: RLE Ortho Device/Splint Interventions: Application,Adjustment   Post Interventions Patient Tolerated: Well Instructions Provided: Care of device   [name] 01/25/2021, 4:44 PM

## 2021-01-25 NOTE — Interval H&P Note (Signed)
History and Physical Interval Note:  01/25/2021 9:31 AM  Scott Greer  has presented today for surgery, with the diagnosis of Left 2nd Toe Gangrene.  The various methods of treatment have been discussed with the patient and family. After consideration of risks, benefits and other options for treatment, the patient has consented to  Procedure(s): LEFT 2ND TOE AMPUTATION (Left) as a surgical intervention.  The patient's history has been reviewed, patient examined, no change in status, stable for surgery.  I have reviewed the patient's chart and labs.  Questions were answered to the patient's satisfaction.     [name]

## 2021-01-25 NOTE — Anesthesia Procedure Notes (Signed)
Procedure Name: MAC Date/Time: 01/25/2021 1:56 PM Performed by: Michele Rockers, CRNA Pre-anesthesia Checklist: Patient identified, Emergency Drugs available, Suction available, Timeout performed and Patient being monitored Patient Re-evaluated:Patient Re-evaluated prior to induction Oxygen Delivery Method: Nasal Cannula

## 2021-01-26 ENCOUNTER — Telehealth (HOSPITAL_COMMUNITY): Payer: Self-pay

## 2021-01-26 ENCOUNTER — Encounter (HOSPITAL_COMMUNITY): Payer: Self-pay | Admitting: Orthopedic Surgery

## 2021-01-26 ENCOUNTER — Other Ambulatory Visit: Payer: Self-pay | Admitting: Physician Assistant

## 2021-01-26 ENCOUNTER — Telehealth: Payer: Self-pay | Admitting: Orthopedic Surgery

## 2021-01-26 NOTE — Telephone Encounter (Signed)
I called pt and he states that he already picked up abx but that he is having pain that the Oxycodone does not touch. I advised the pt that he soul be non wtb on left foot s/p toe amputation yesterday. He should elevate higher than his heart and he can remove the coban dressing to see if this is maybe too tight and causing discomfort. Pt states that the coban " fell off yesterday" I advised we can see him in the office today and he states that he would like to come in tomorrow.  Appt sch for 9:30

## 2021-01-26 NOTE — Telephone Encounter (Signed)
Spoke to Scott Greer who agreed to meet up at clinic for initial visit today when he meets Scott Greer. Scott Greer agreed with plan and will call me when he is on the way to meet Scott Greer. If this plan doesn't work out he understands a nurse will assist and I will follow up in the home next week. Scott Greer agreed. Call complete.

## 2021-01-26 NOTE — Telephone Encounter (Signed)
Attempted to reach Scott Greer several times today to meet up at clinic for med rec but unable to reach him. I will reach out Monday for home visit.

## 2021-01-26 NOTE — Telephone Encounter (Signed)
Doxycycline was called in yesterday for him per Dr. Audrie Lia request to [facility]. Would need to be seen if pain is that bad.

## 2021-01-26 NOTE — Telephone Encounter (Signed)
Patient called advised he need something called in for pain. Patient asked if he should be on an antibiotic? Patient said he is in a lot of pain. Patient said his foot is throbbing and the 5 mg Oxycodone did not help. Patient said he uses the [facility] [address]  [place]. The number to contact patient is [phone]

## 2021-01-27 ENCOUNTER — Ambulatory Visit: Payer: Medicaid Other | Admitting: Family

## 2021-01-30 ENCOUNTER — Other Ambulatory Visit (HOSPITAL_COMMUNITY): Payer: Self-pay

## 2021-01-30 ENCOUNTER — Other Ambulatory Visit (HOSPITAL_COMMUNITY): Payer: Self-pay | Admitting: *Deleted

## 2021-01-30 ENCOUNTER — Telehealth (HOSPITAL_COMMUNITY): Payer: Self-pay | Admitting: Licensed Clinical Social Worker

## 2021-01-30 DIAGNOSIS — E782 Mixed hyperlipidemia: Secondary | ICD-10-CM

## 2021-01-30 MED ORDER — DIGOXIN 125 MCG PO TABS: 0.1250 mg | ORAL_TABLET | Freq: Every day | ORAL | 1 refills | Status: DC

## 2021-01-30 MED ORDER — POTASSIUM CHLORIDE ER 20 MEQ PO TBCR: 20.0000 meq | EXTENDED_RELEASE_TABLET | Freq: Every day | ORAL | 1 refills | Status: DC

## 2021-01-30 MED ORDER — ASPIRIN 81 MG PO CHEW: 81.0000 mg | CHEWABLE_TABLET | Freq: Every day | ORAL | 2 refills | Status: DC

## 2021-01-30 MED ORDER — EMPAGLIFLOZIN 10 MG PO TABS: 10.0000 mg | ORAL_TABLET | Freq: Every day | ORAL | 1 refills | Status: DC

## 2021-01-30 MED ORDER — AMIODARONE HCL 200 MG PO TABS: 200.0000 mg | ORAL_TABLET | Freq: Every day | ORAL | 1 refills | Status: DC

## 2021-01-30 MED ORDER — ATORVASTATIN CALCIUM 10 MG PO TABS
10.0000 mg | ORAL_TABLET | Freq: Every day | ORAL | 1 refills | Status: DC
Start: 2021-01-30 — End: 2021-02-16

## 2021-01-30 MED ORDER — TORSEMIDE 20 MG PO TABS: 60.0000 mg | ORAL_TABLET | Freq: Every day | ORAL | 6 refills | Status: DC

## 2021-01-30 MED ORDER — SACUBITRIL-VALSARTAN 97-103 MG PO TABS: 1.0000 | ORAL_TABLET | Freq: Two times a day (BID) | ORAL | 5 refills | Status: DC

## 2021-01-30 MED ORDER — SPIRONOLACTONE 25 MG PO TABS: 25.0000 mg | ORAL_TABLET | Freq: Every day | ORAL | 1 refills | Status: DC

## 2021-01-30 NOTE — Progress Notes (Signed)
Paramedicine Encounter    Patient ID: Scott Greer, male    DOB: [birth year], 65 y.o.   MRN: [id number]   Patient Care Team: [name], MD as PCP - General (Internal Medicine) [name], [name], MD as PCP - General (Cardiology) [name], [name], MD as PCP - Cardiology (Cardiology)  Patient Active Problem List   Diagnosis Date Noted  . Osteomyelitis of second toe of left foot ([place])   . New onset of congestive heart failure ([place]) 12/26/2020  . CKD (chronic kidney disease) stage 3, GFR 30-59 ml/min (HCC) 12/26/2020  . Acute systolic CHF (congestive heart failure) ([place]) 12/26/2020  . Anemia, chronic disease 06/22/2020  . Amputee, great toe, right ([place]) 06/20/2020  . Normocytic anemia 06/20/2020  . Erectile dysfunction associated with type 2 diabetes mellitus ([place]) 06/20/2020  . Positive for macroalbuminuria 10/24/2019  . Amputation of left great toe ([place]) 10/23/2019  . Tobacco dependence 10/23/2019  . Osteomyelitis of great toe of right foot ([place]) 06/04/2019  . Diabetic foot infection ([place])   . Noncompliance with medication regimen   . Glaucoma   . Hyperlipidemia   . Essential hypertension 11/06/2003  . Diabetes mellitus ([place]) 11/05/1997    Current Outpatient Medications:  .  amiodarone (PACERONE) 200 MG tablet, Take 1 tablet (200 mg total) by mouth daily., Disp: 30 tablet, Rfl: 1 .  aspirin 81 MG chewable tablet, Chew 1 tablet (81 mg total) by mouth daily., Disp: 30 tablet, Rfl: 2 .  atorvastatin (LIPITOR) 10 MG tablet, Take 1 tablet (10 mg total) by mouth daily., Disp: 30 tablet, Rfl: 1 .  Blood Glucose Monitoring Suppl (TRUE METRIX METER) w/Device KIT, Use as directed, Disp: 1 kit, Rfl: 0 .  digoxin (LANOXIN) 0.125 MG tablet, Take 1 tablet (0.125 mg total) by mouth daily., Disp: 30 tablet, Rfl: 1 .  doxycycline (VIBRA-TABS) 100 MG tablet, Take 1 tablet (100 mg total) by mouth 2 (two) times daily., Disp: 60 tablet, Rfl: 0 .  empagliflozin (JARDIANCE) 10 MG TABS tablet,  Take 1 tablet (10 mg total) by mouth daily., Disp: 30 tablet, Rfl: 1 .  glucose blood (TRUE METRIX BLOOD GLUCOSE TEST) test strip, Use as instructed, Disp: 100 each, Rfl: 12 .  metFORMIN (GLUCOPHAGE) 500 MG tablet, Take 1 tablet (500 mg total) by mouth 2 (two) times daily with a meal., Disp: 60 tablet, Rfl: 1 .  oxyCODONE-acetaminophen (PERCOCET/ROXICET) 5-325 MG tablet, Take 1 tablet by mouth every 4 (four) hours as needed for severe pain., Disp: 30 tablet, Rfl: 0 .  potassium chloride 20 MEQ TBCR, Take 20 mEq by mouth daily., Disp: 30 tablet, Rfl: 1 .  sacubitril-valsartan (ENTRESTO) 97-103 MG, Take 1 tablet by mouth 2 (two) times daily., Disp: 60 tablet, Rfl: 5 .  spironolactone (ALDACTONE) 25 MG tablet, Take 1 tablet (25 mg total) by mouth daily., Disp: 30 tablet, Rfl: 1 .  torsemide (DEMADEX) 20 MG tablet, Take 3 tablets (60 mg total) by mouth daily., Disp: 90 tablet, Rfl: 6 .  TRUEplus Lancets 28G MISC, Use as directed, Disp: 100 each, Rfl: 4 .  Vitamin D, Ergocalciferol, (DRISDOL) 1.25 MG (50000 UNIT) CAPS capsule, Take 1 capsule (50,000 Units total) by mouth every 7 (seven) days., Disp: 4 capsule, Rfl: 2 Allergies  Allergen Reactions  . Bee Venom Hives, Itching and Swelling     Social History   Socioeconomic History  . Marital status: Legally Separated    Spouse name: Nira Conn  . Number of children: 2  . Years of  education: Not on file  . Highest education level: Associate degree: occupational, Hotel manager, or vocational program  Occupational History  . Not on file  Tobacco Use  . Smoking status: Current Every Day Smoker    Types: Cigars, Cigarettes  . Smokeless tobacco: Former Systems developer    Types: Chew  . Tobacco comment: 6 daily  Vaping Use  . Vaping Use: Never used  Substance and Sexual Activity  . Alcohol use: Yes    Alcohol/week: 11.0 standard drinks    Types: 3 Cans of beer, 8 Shots of liquor per week    Comment: occasional  . Drug use: Yes    Types: Marijuana    Comment:  ocassional - last time 05/08/2020  . Sexual activity: Not Currently  Other Topics Concern  . Not on file  Social History Narrative   Out of prison for 2 months.   Lives at home with his wife.   Social Determinants of Health   Financial Resource Strain: High Risk  . Difficulty of Paying Living Expenses: Very hard  Food Insecurity: Food Insecurity Present  . Worried About Charity fundraiser in the Last Year: Sometimes true  . Ran Out of Food in the Last Year: Sometimes true  Transportation Needs: Unmet Transportation Needs  . Lack of Transportation (Medical): Yes  . Lack of Transportation (Non-Medical): Yes  Physical Activity: Inactive  . Days of Exercise per Week: 0 days  . Minutes of Exercise per Session: 0 min  Stress: Not on file  Social Connections: Not on file  Intimate Partner Violence: Not on file    Physical Exam Vitals reviewed. Exam conducted with a chaperone present.  Constitutional:      Appearance: Normal appearance. He is normal weight.  HENT:     Head: Normocephalic.     Nose: Nose normal.     Mouth/Throat:     Mouth: Mucous membranes are moist.     Pharynx: Oropharynx is clear.  Eyes:     Conjunctiva/sclera: Conjunctivae normal.     Pupils: Pupils are equal, round, and reactive to light.  Cardiovascular:     Rate and Rhythm: Normal rate and regular rhythm.     Pulses: Normal pulses.     Heart sounds: Normal heart sounds.  Abdominal:     General: Abdomen is flat.     Palpations: Abdomen is soft.  Musculoskeletal:        General: Tenderness present. No swelling.     Cervical back: Normal range of motion.     Right lower leg: No edema.     Left lower leg: No edema.     Comments: Left foot wound from recent surgery  Skin:    General: Skin is warm and dry.  Neurological:     General: No focal deficit present.     Mental Status: He is alert. Mental status is at baseline.  Psychiatric:        Mood and Affect: Mood normal.    Arrived for home visit  for Zalman where he was alert and oriented laying on the couch reporting he is having some pain from his recent amputation on his left foot of his 1st and 2nd toe. Jakai had same wrapped with ace bandaged, guaze and paper towels. Burak said he had been using alcohol and peroxide to clean the site. I unwrapped the bandages and assessed the site. Site was oozing blood and clear pus out of the site, stiches were present and there was tissue visible  through the stiches. A foul odor was coming from the site. I encouraged Mr. Chait to clean the site gently with gentle soap and pat dry the area and wrap with clean dressings. I assisted with same. Criag's foot was wrapped with 4x4 gauze and roller gauze and his ortho boot was secured. I obtained vitals as noted. Vaun denied shortness of breath, dizziness or chest pain however he reports severe pain in his foot despite the Oxycodone. Blaine had not yet started the antibiotic which was picked up last Thursday  3/24. I ensured he took same today with morning medications at 1200. I reviewed medications and filled pill box accordingly. I then discussed pharmacy options with Mando to ensure he could pick up his medications easily. He chose [facility] on [address]. We discussed possibility of moving to [employer] for medicaid copays being waived as well as free delivery. He reports we can see about that later.   [place] is to follow up with PA at Dr. Gavin Potters office this week. I will call and assist with ensuring he makes this appointment.   Home visit complete.   I will follow up in the home in one week.   Eliezer Lofts LCSW needing assistance applications picked up and delivered I will obtain same this week and deliver same.    CBG- 344 (no meds, no meal since yesterday) Cecilie Lowers took morning metformin and jardiance upon our visit.    I will follow up on Thursday for wound care needs.   Refills:  NONE    Future Appointments  Date Time Provider [place]   02/02/2021  2:00 PM [facility] CCC-MM CARE MANAGER [facility] None  02/10/2021  9:10 AM [name], MD [facility] None  02/14/2021  2:00 PM [facility] PHARMACY [facility] None  03/07/2021  1:30 PM [facility] PA/NP [facility] None     ACTION: Home visit completed Next visit planned for one week.

## 2021-01-30 NOTE — Telephone Encounter (Signed)
CSW called pt to check in- was supposed to come to clinic on Thursday to get assistance with med box and to meet with CSW regarding utility assistance but we were unable to get in contact with him.  Pt states he got distracted last week and was in immense pain from his surgery site so it was difficult for him to work.  Does report he was able to get the antibiotic that was prescribed by ortho but that he was never informed how to care for his wound and he has since gotten some kind of ointment/cream to put on it.    CSW updated Paramedic regarding above and she will be completing home visit today to follow up.  CSW will continue to follow and assist as needed  Burna Sis, LCSW Clinical Social Worker [facility] Desk#: [phone] Cell#: [phone]

## 2021-01-31 ENCOUNTER — Telehealth: Payer: Self-pay

## 2021-01-31 ENCOUNTER — Telehealth (HOSPITAL_COMMUNITY): Payer: Self-pay

## 2021-01-31 NOTE — Telephone Encounter (Signed)
Spoke to [place] reminding him of his follow up appointment tomorrow with Cyndia Skeeters at 1:30. Scott Greer reports he will be there because his foot is "leaking" and is in a lot of pain despite the oxycodone and wound care. I will follow up with him Thursday. Call complete.

## 2021-01-31 NOTE — Telephone Encounter (Signed)
Pt called asking if his pain medication has been sent in ?  Pt states that the oxycodone isn't touching the pain

## 2021-01-31 NOTE — Telephone Encounter (Signed)
Oxycodone 5/325 was refilled on 01/23/21 # 30 too soon for refill but if he is having that level of discomfort he should come in to the office for eval. To call and make an appt we can get him in this week or to call me if there are any other questions.

## 2021-02-01 ENCOUNTER — Inpatient Hospital Stay: Payer: Medicaid Other | Admitting: Physician Assistant

## 2021-02-02 ENCOUNTER — Other Ambulatory Visit: Payer: Self-pay | Admitting: *Deleted

## 2021-02-02 ENCOUNTER — Telehealth (HOSPITAL_COMMUNITY): Payer: Self-pay

## 2021-02-02 NOTE — Telephone Encounter (Signed)
Attempted to reach Scott Greer to obtain documents for [name] as well as assist him with rescheduling missed appointment yesterday. VM Box is full, I will continue to reach out.

## 2021-02-02 NOTE — Patient Outreach (Signed)
Care Coordination  02/02/2021  LAUREN AGUAYO 06/17/1956 [id number]    Medicaid Managed Care   Unsuccessful Outreach Note  02/02/2021 Name: REVAN GENDRON MRN: [id number] DOB: 12/14/1955  Referred by: [name], MD Reason for referral : High Risk Managed Medicaid (Unsuccessful RNCM initial outreach)   A second unsuccessful telephone outreach was attempted today. The patient was referred to the case management team for assistance with care management and care coordination.   Follow Up Plan: The care management team will reach out to the patient again over the next 7-14 days.   Estanislado Emms RN, BSN   [facility]

## 2021-02-02 NOTE — Patient Instructions (Signed)
Visit Information  Mr. Dru L Bayron  - as a part of your Medicaid benefit, you are eligible for care management and care coordination services at no cost or copay. I was unable to reach you by phone today but would be happy to help you with your health related needs. Please feel free to call me @ [phone].   A member of the Managed Medicaid care management team will reach out to you again over the next 7-14 days.   Harlym Gehling RN, BSN [place]  [facility] RN Care Coordinator     

## 2021-02-03 ENCOUNTER — Telehealth: Payer: Self-pay | Admitting: Orthopedic Surgery

## 2021-02-03 ENCOUNTER — Telehealth (HOSPITAL_COMMUNITY): Payer: Self-pay | Admitting: Licensed Clinical Social Worker

## 2021-02-03 ENCOUNTER — Telehealth: Payer: Self-pay

## 2021-02-03 DIAGNOSIS — Z419 Encounter for procedure for purposes other than remedying health state, unspecified: Secondary | ICD-10-CM | POA: Diagnosis not present

## 2021-02-03 NOTE — Telephone Encounter (Signed)
CSW received call from pt to follow up as he had missed ortho appt earlier this week and had not come by to finish utility assistance applications with CSW.  Pt states he felt bad the day of his appt and ended up sleeping through it.  He has already and rescheduled for appt Monday.  Pt will plan to come to clinic before or after appt with ortho to work on patient assistance applications.  Will continue to follow and assist as needed  Burna Sis, LCSW Clinical Social Worker [facility] Desk#: [phone] Cell#: [phone]

## 2021-02-03 NOTE — Telephone Encounter (Signed)
Pt s/p toe amputation 01/25/21 and has not shown for 2 office visits. Pt calling today advising that he was never told what to do for his foot and he needs pain medication. I advised the pt we can make an appt for Monday, gave instructions on changing the dressing daily not to submerge in water ( he has already done this by taking a bath) advised to wtb through heel and keep foot clean, dry and elevated. Patient upset because he will not get refill on pain medication until office visit. He was given # 30 at the time of discharge and we need to follow up with him in the office to make sure that he is healing. Pt voiced understanding and will come in on Monday.

## 2021-02-06 ENCOUNTER — Inpatient Hospital Stay (HOSPITAL_COMMUNITY)
Admission: EM | Admit: 2021-02-06 | Discharge: 2021-02-15 | DRG: 580 | Disposition: A | Discharge status: Home Health | Payer: Medicaid Other | Attending: Family Medicine | Admitting: Family Medicine

## 2021-02-06 ENCOUNTER — Telehealth (HOSPITAL_COMMUNITY): Payer: Self-pay | Admitting: Licensed Clinical Social Worker

## 2021-02-06 ENCOUNTER — Ambulatory Visit: Payer: Medicaid Other | Admitting: Orthopedic Surgery

## 2021-02-06 ENCOUNTER — Encounter (HOSPITAL_COMMUNITY): Payer: Self-pay | Admitting: Emergency Medicine

## 2021-02-06 ENCOUNTER — Telehealth (HOSPITAL_COMMUNITY): Payer: Self-pay

## 2021-02-06 ENCOUNTER — Other Ambulatory Visit: Payer: Self-pay

## 2021-02-06 ENCOUNTER — Emergency Department (HOSPITAL_COMMUNITY): Payer: Medicaid Other

## 2021-02-06 DIAGNOSIS — L02612 Cutaneous abscess of left foot: Principal | ICD-10-CM

## 2021-02-06 DIAGNOSIS — Z89422 Acquired absence of other left toe(s): Secondary | ICD-10-CM

## 2021-02-06 DIAGNOSIS — I110 Hypertensive heart disease with heart failure: Secondary | ICD-10-CM | POA: Diagnosis present

## 2021-02-06 DIAGNOSIS — Z6824 Body mass index (BMI) 24.0-24.9, adult: Secondary | ICD-10-CM

## 2021-02-06 DIAGNOSIS — Z20822 Contact with and (suspected) exposure to covid-19: Secondary | ICD-10-CM | POA: Diagnosis present

## 2021-02-06 DIAGNOSIS — L089 Local infection of the skin and subcutaneous tissue, unspecified: Secondary | ICD-10-CM | POA: Diagnosis not present

## 2021-02-06 DIAGNOSIS — E875 Hyperkalemia: Secondary | ICD-10-CM | POA: Diagnosis not present

## 2021-02-06 DIAGNOSIS — M86172 Other acute osteomyelitis, left ankle and foot: Secondary | ICD-10-CM | POA: Diagnosis present

## 2021-02-06 DIAGNOSIS — N179 Acute kidney failure, unspecified: Secondary | ICD-10-CM

## 2021-02-06 DIAGNOSIS — I5042 Chronic combined systolic (congestive) and diastolic (congestive) heart failure: Secondary | ICD-10-CM | POA: Diagnosis present

## 2021-02-06 DIAGNOSIS — Z7982 Long term (current) use of aspirin: Secondary | ICD-10-CM

## 2021-02-06 DIAGNOSIS — T8130XA Disruption of wound, unspecified, initial encounter: Secondary | ICD-10-CM | POA: Diagnosis not present

## 2021-02-06 DIAGNOSIS — E11621 Type 2 diabetes mellitus with foot ulcer: Secondary | ICD-10-CM | POA: Diagnosis present

## 2021-02-06 DIAGNOSIS — S91302A Unspecified open wound, left foot, initial encounter: Secondary | ICD-10-CM | POA: Diagnosis not present

## 2021-02-06 DIAGNOSIS — E46 Unspecified protein-calorie malnutrition: Secondary | ICD-10-CM

## 2021-02-06 DIAGNOSIS — F17210 Nicotine dependence, cigarettes, uncomplicated: Secondary | ICD-10-CM | POA: Diagnosis present

## 2021-02-06 DIAGNOSIS — E871 Hypo-osmolality and hyponatremia: Secondary | ICD-10-CM

## 2021-02-06 DIAGNOSIS — D75838 Other thrombocytosis: Secondary | ICD-10-CM | POA: Diagnosis present

## 2021-02-06 DIAGNOSIS — E440 Moderate protein-calorie malnutrition: Secondary | ICD-10-CM | POA: Diagnosis present

## 2021-02-06 DIAGNOSIS — E8809 Other disorders of plasma-protein metabolism, not elsewhere classified: Secondary | ICD-10-CM | POA: Diagnosis present

## 2021-02-06 DIAGNOSIS — Z89412 Acquired absence of left great toe: Secondary | ICD-10-CM

## 2021-02-06 DIAGNOSIS — Z79899 Other long term (current) drug therapy: Secondary | ICD-10-CM

## 2021-02-06 DIAGNOSIS — Z5329 Procedure and treatment not carried out because of patient's decision for other reasons: Secondary | ICD-10-CM | POA: Diagnosis present

## 2021-02-06 DIAGNOSIS — Z91199 Patient's noncompliance with other medical treatment and regimen due to unspecified reason: Secondary | ICD-10-CM

## 2021-02-06 DIAGNOSIS — E1169 Type 2 diabetes mellitus with other specified complication: Secondary | ICD-10-CM | POA: Diagnosis present

## 2021-02-06 DIAGNOSIS — Z91030 Bee allergy status: Secondary | ICD-10-CM

## 2021-02-06 DIAGNOSIS — E1152 Type 2 diabetes mellitus with diabetic peripheral angiopathy with gangrene: Secondary | ICD-10-CM | POA: Diagnosis present

## 2021-02-06 DIAGNOSIS — D72829 Elevated white blood cell count, unspecified: Secondary | ICD-10-CM

## 2021-02-06 DIAGNOSIS — E1165 Type 2 diabetes mellitus with hyperglycemia: Secondary | ICD-10-CM

## 2021-02-06 DIAGNOSIS — Z823 Family history of stroke: Secondary | ICD-10-CM

## 2021-02-06 DIAGNOSIS — N1831 Chronic kidney disease, stage 3a: Secondary | ICD-10-CM | POA: Diagnosis present

## 2021-02-06 DIAGNOSIS — D75839 Thrombocytosis, unspecified: Secondary | ICD-10-CM

## 2021-02-06 DIAGNOSIS — I2510 Atherosclerotic heart disease of native coronary artery without angina pectoris: Secondary | ICD-10-CM | POA: Diagnosis present

## 2021-02-06 DIAGNOSIS — T8131XA Disruption of external operation (surgical) wound, not elsewhere classified, initial encounter: Secondary | ICD-10-CM | POA: Diagnosis present

## 2021-02-06 DIAGNOSIS — Y835 Amputation of limb(s) as the cause of abnormal reaction of the patient, or of later complication, without mention of misadventure at the time of the procedure: Secondary | ICD-10-CM | POA: Diagnosis present

## 2021-02-06 DIAGNOSIS — E785 Hyperlipidemia, unspecified: Secondary | ICD-10-CM | POA: Diagnosis present

## 2021-02-06 DIAGNOSIS — L97529 Non-pressure chronic ulcer of other part of left foot with unspecified severity: Secondary | ICD-10-CM | POA: Diagnosis present

## 2021-02-06 DIAGNOSIS — Z833 Family history of diabetes mellitus: Secondary | ICD-10-CM

## 2021-02-06 DIAGNOSIS — D638 Anemia in other chronic diseases classified elsewhere: Secondary | ICD-10-CM | POA: Diagnosis present

## 2021-02-06 DIAGNOSIS — I10 Essential (primary) hypertension: Secondary | ICD-10-CM | POA: Diagnosis present

## 2021-02-06 DIAGNOSIS — Z9119 Patient's noncompliance with other medical treatment and regimen: Secondary | ICD-10-CM

## 2021-02-06 DIAGNOSIS — E1122 Type 2 diabetes mellitus with diabetic chronic kidney disease: Secondary | ICD-10-CM | POA: Diagnosis present

## 2021-02-06 DIAGNOSIS — I428 Other cardiomyopathies: Secondary | ICD-10-CM | POA: Diagnosis present

## 2021-02-06 DIAGNOSIS — I96 Gangrene, not elsewhere classified: Secondary | ICD-10-CM

## 2021-02-06 DIAGNOSIS — F17290 Nicotine dependence, other tobacco product, uncomplicated: Secondary | ICD-10-CM | POA: Diagnosis present

## 2021-02-06 DIAGNOSIS — D649 Anemia, unspecified: Secondary | ICD-10-CM | POA: Diagnosis present

## 2021-02-06 DIAGNOSIS — I493 Ventricular premature depolarization: Secondary | ICD-10-CM | POA: Diagnosis present

## 2021-02-06 DIAGNOSIS — Z7984 Long term (current) use of oral hypoglycemic drugs: Secondary | ICD-10-CM

## 2021-02-06 DIAGNOSIS — F1010 Alcohol abuse, uncomplicated: Secondary | ICD-10-CM | POA: Diagnosis present

## 2021-02-06 DIAGNOSIS — Z89411 Acquired absence of right great toe: Secondary | ICD-10-CM

## 2021-02-06 DIAGNOSIS — I130 Hypertensive heart and chronic kidney disease with heart failure and stage 1 through stage 4 chronic kidney disease, or unspecified chronic kidney disease: Secondary | ICD-10-CM | POA: Diagnosis present

## 2021-02-06 DIAGNOSIS — H409 Unspecified glaucoma: Secondary | ICD-10-CM | POA: Diagnosis present

## 2021-02-06 LAB — COMPREHENSIVE METABOLIC PANEL
ALT: 13 U/L (ref 0–44)
AST: 17 U/L (ref 15–41)
Albumin: 2.5 g/dL — ABNORMAL LOW (ref 3.5–5.0)
Alkaline Phosphatase: 98 U/L (ref 38–126)
Anion gap: 11 (ref 5–15)
BUN: 30 mg/dL — ABNORMAL HIGH (ref 8–23)
CO2: 27 mmol/L (ref 22–32)
Calcium: 9.1 mg/dL (ref 8.9–10.3)
Chloride: 88 mmol/L — ABNORMAL LOW (ref 98–111)
Creatinine, Ser: 1.54 mg/dL — ABNORMAL HIGH (ref 0.61–1.24)
GFR, Estimated: 51 mL/min — ABNORMAL LOW (ref >60)
Glucose, Bld: 237 mg/dL — ABNORMAL HIGH (ref 70–99)
Potassium: 5 mmol/L (ref 3.5–5.1)
Sodium: 126 mmol/L — ABNORMAL LOW (ref 135–145)
Total Bilirubin: 1.7 mg/dL — ABNORMAL HIGH (ref 0.3–1.2)
Total Protein: 7.5 g/dL (ref 6.5–8.1)

## 2021-02-06 LAB — CBC WITH DIFFERENTIAL/PLATELET
Abs Immature Granulocytes: 0 10*3/uL (ref 0.00–0.07)
Basophils Absolute: 0 10*3/uL (ref 0.0–0.1)
Basophils Relative: 0 %
Eosinophils Absolute: 0 10*3/uL (ref 0.0–0.5)
Eosinophils Relative: 0 %
HCT: 34.5 % — ABNORMAL LOW (ref 39.0–52.0)
Hemoglobin: 11.2 g/dL — ABNORMAL LOW (ref 13.0–17.0)
Lymphocytes Relative: 3 %
Lymphs Abs: 0.9 10*3/uL (ref 0.7–4.0)
MCH: 26.4 pg (ref 26.0–34.0)
MCHC: 32.5 g/dL (ref 30.0–36.0)
MCV: 81.4 fL (ref 80.0–100.0)
Monocytes Absolute: 2.9 10*3/uL — ABNORMAL HIGH (ref 0.1–1.0)
Monocytes Relative: 10 %
Neutro Abs: 24.8 10*3/uL — ABNORMAL HIGH (ref 1.7–7.7)
Neutrophils Relative %: 87 %
Platelets: 563 10*3/uL — ABNORMAL HIGH (ref 150–400)
RBC: 4.24 MIL/uL (ref 4.22–5.81)
RDW: 16.1 % — ABNORMAL HIGH (ref 11.5–15.5)
WBC: 28.5 10*3/uL — ABNORMAL HIGH (ref 4.0–10.5)
nRBC: 0 % (ref 0.0–0.2)
nRBC: 0 /100 WBC

## 2021-02-06 LAB — LACTIC ACID, PLASMA: Lactic Acid, Venous: 1.9 mmol/L (ref 0.5–1.9)

## 2021-02-06 MED ORDER — LACTATED RINGERS IV BOLUS
1000.0000 mL | Freq: Once | INTRAVENOUS | Status: AC
  Administered 2021-02-06: 1000 mL via INTRAVENOUS

## 2021-02-06 MED ORDER — OXYCODONE-ACETAMINOPHEN 5-325 MG PO TABS
1.0000 | ORAL_TABLET | Freq: Once | ORAL | Status: AC
Start: 2021-02-06 — End: 2021-02-06
  Administered 2021-02-06: 1 via ORAL
  Filled 2021-02-06: qty 1

## 2021-02-06 MED ORDER — VANCOMYCIN HCL 1500 MG/300ML IV SOLN
1500.0000 mg | Freq: Once | INTRAVENOUS | Status: AC
  Administered 2021-02-07: 1500 mg via INTRAVENOUS
  Filled 2021-02-06: qty 300

## 2021-02-06 MED ORDER — FENTANYL CITRATE (PF) 100 MCG/2ML IJ SOLN
50.0000 ug | Freq: Once | INTRAMUSCULAR | Status: AC
  Administered 2021-02-06: 50 ug via INTRAVENOUS
  Filled 2021-02-06: qty 2

## 2021-02-06 MED ORDER — SODIUM CHLORIDE 0.9 % IV SOLN
2.0000 g | Freq: Once | INTRAVENOUS | Status: AC
  Administered 2021-02-06: 2 g via INTRAVENOUS
  Filled 2021-02-06: qty 2

## 2021-02-06 NOTE — Telephone Encounter (Signed)
CSW attempted to call pt to remind of ortho appt this afternoon- unable to reach- left VM to inform and requesting return call  Will continue to attempt contact and assist as needed  Burna Sis, LCSW Clinical Social Worker [facility] Desk#: [phone] Cell#: [phone]

## 2021-02-06 NOTE — ED Provider Notes (Signed)
[facility] EMERGENCY DEPARTMENT Provider Note   CSN: [id number] Arrival date & time: 02/06/21  2019     History Chief Complaint  Patient presents with  . Foot Pain    Scott Greer is a 65 y.o. male.  HPI Patient is a 65 YO male with a history of DM, HLD, HTN and recent left 2nd toe amputation presenting for dehiscence of the wound site.  Patient states that he missed 2 postoperative visits and now is here with worsening pain and wound site abnormality. Patient endorses fevers/chills and pain in the foot. Patient states that he attempted to wash the foot in a bath when the site popped open. Patient has a history of similar in the past.    Past Medical History:  Diagnosis Date  . Dehiscence of amputation stump (HCC)    left great toe  . Diabetes mellitus without complication ([place]) 4268   Type II  . Glaucoma 2015  . Hyperlipidemia   . Hypertension 2005  . Osteomyelitis ([place])   . Wears glasses     Patient Active Problem List   Diagnosis Date Noted  . Left foot infection 02/07/2021  . Osteomyelitis of second toe of left foot ([place])   . New onset of congestive heart failure ([place]) 12/26/2020  . CKD (chronic kidney disease) stage 3, GFR 30-59 ml/min (HCC) 12/26/2020  . Acute systolic CHF (congestive heart failure) ([place]) 12/26/2020  . Anemia, chronic disease 06/22/2020  . Amputee, great toe, right ([place]) 06/20/2020  . Normocytic anemia 06/20/2020  . Erectile dysfunction associated with type 2 diabetes mellitus ([place]) 06/20/2020  . Positive for macroalbuminuria 10/24/2019  . Amputation of left great toe ([place]) 10/23/2019  . Tobacco dependence 10/23/2019  . Osteomyelitis of great toe of right foot ([place]) 06/04/2019  . Diabetic foot infection ([place])   . Noncompliance with medication regimen   . Glaucoma   . Hyperlipidemia   . Essential hypertension 11/06/2003  . Diabetes mellitus ([place]) 11/05/1997    Past Surgical History:  Procedure Laterality Date  .  AMPUTATION Left 06/05/2019   Procedure: LEFT GREAT TOE AMPUTATION;  Surgeon: Newt Minion, MD;  Location: [facility];  Service: Orthopedics;  Laterality: Left;  . AMPUTATION Left 07/10/2019   Procedure: LEFT FOOT 1ST RAY AMPUTATION;  Surgeon: Newt Minion, MD;  Location: [facility];  Service: Orthopedics;  Laterality: Left;  . AMPUTATION Right 05/25/2020   Procedure: RIGHT GREAT TOE AMPUTATION;  Surgeon: Newt Minion, MD;  Location: [facility];  Service: Orthopedics;  Laterality: Right;  . AMPUTATION Left 01/25/2021   Procedure: LEFT 2ND TOE AMPUTATION;  Surgeon: Newt Minion, MD;  Location: [facility];  Service: Orthopedics;  Laterality: Left;  . NO PAST SURGERIES    . RIGHT/LEFT HEART CATH AND CORONARY ANGIOGRAPHY N/A 01/02/2021   Procedure: RIGHT/LEFT HEART CATH AND CORONARY ANGIOGRAPHY;  Surgeon: Lorretta Harp, MD;  Location: [facility];  Service: Cardiovascular;  Laterality: N/A;       Family History  Problem Relation Age of Onset  . Stroke Mother   . Diabetes Mother        Toward end of life    Social History   Tobacco Use  . Smoking status: Current Every Day Smoker    Types: Cigars, Cigarettes  . Smokeless tobacco: Former Systems developer    Types: Chew  . Tobacco comment: 6 daily  Vaping Use  . Vaping Use: Never used  Substance Use Topics  . Alcohol use: Yes  Alcohol/week: 11.0 standard drinks    Types: 3 Cans of beer, 8 Shots of liquor per week    Comment: occasional  . Drug use: Yes    Types: Marijuana    Comment: ocassional - last time 05/08/2020    Home Medications Prior to Admission medications   Medication Sig Start Date End Date Taking? Authorizing Provider  amiodarone (PACERONE) 200 MG tablet Take 1 tablet (200 mg total) by mouth daily. 01/30/21   [name], FNP  aspirin 81 MG chewable tablet Chew 1 tablet (81 mg total) by mouth daily. 01/30/21   Milford, Maricela Bo, FNP  atorvastatin (LIPITOR) 10 MG tablet Take 1 tablet (10 mg total) by mouth daily. 01/30/21    [name], FNP  Blood Glucose Monitoring Suppl (TRUE METRIX METER) w/Device KIT Use as directed 10/23/19   [name], MD  digoxin (LANOXIN) 0.125 MG tablet Take 1 tablet (0.125 mg total) by mouth daily. 01/30/21   [place]ford, Maricela Bo, FNP  doxycycline (VIBRA-TABS) 100 MG tablet Take 1 tablet (100 mg total) by mouth 2 (two) times daily. 01/25/21   Persons, [name], PA  empagliflozin (JARDIANCE) 10 MG TABS tablet Take 1 tablet (10 mg total) by mouth daily. 01/30/21   [name], FNP  glucose blood (TRUE METRIX BLOOD GLUCOSE TEST) test strip Use as instructed 10/23/19   [name], MD  metFORMIN (GLUCOPHAGE) 500 MG tablet Take 1 tablet (500 mg total) by mouth 2 (two) times daily with a meal. 12/14/20   [name], Cari S, PA-C  oxyCODONE-acetaminophen (PERCOCET/ROXICET) 5-325 MG tablet Take 1 tablet by mouth every 4 (four) hours as needed for severe pain. 01/23/21   Newt Minion, MD  Potassium Chloride ER 20 MEQ TBCR Take 20 mEq by mouth daily. 01/30/21   [place]ford, Maricela Bo, FNP  sacubitril-valsartan (ENTRESTO) 97-103 MG Take 1 tablet by mouth 2 (two) times daily. 01/30/21   [name], FNP  spironolactone (ALDACTONE) 25 MG tablet Take 1 tablet (25 mg total) by mouth daily. 01/30/21   [name], FNP  torsemide (DEMADEX) 20 MG tablet Take 3 tablets (60 mg total) by mouth daily. 01/30/21   [name], FNP  TRUEplus Lancets 28G MISC Use as directed 10/23/19   [name], MD  Vitamin D, Ergocalciferol, (DRISDOL) 1.25 MG (50000 UNIT) CAPS capsule Take 1 capsule (50,000 Units total) by mouth every 7 (seven) days. Patient not taking: Reported on 01/30/2021 12/16/20   [name], [name], PA-C    Allergies    Bee venom  Review of Systems   Review of Systems  Constitutional: Positive for fever. Negative for chills.  HENT: Negative for ear pain and sore throat.   Eyes: Negative for pain and visual disturbance.  Respiratory: Negative for cough and shortness  of breath.   Cardiovascular: Negative for chest pain and palpitations.  Gastrointestinal: Negative for abdominal pain and vomiting.  Genitourinary: Negative for dysuria and hematuria.  Musculoskeletal: Negative for arthralgias and back pain.  Skin: Negative for color change and rash.  Neurological: Negative for seizures and syncope.  All other systems reviewed and are negative.   Physical Exam Updated Vital Signs BP (!) 96/55   Pulse 99   Temp 98.3 F (36.8 C) (Oral)   Resp 11   Ht '6\' 3"'  (1.905 m)   SpO2 100%   BMI 24.87 kg/m   Physical Exam Vitals and nursing note reviewed.  Constitutional:      Appearance: He is  well-developed.  HENT:     Head: Normocephalic and atraumatic.  Eyes:     Conjunctiva/sclera: Conjunctivae normal.  Cardiovascular:     Rate and Rhythm: Normal rate and regular rhythm.     Heart sounds: No murmur heard.   Pulmonary:     Effort: Pulmonary effort is normal. No respiratory distress.     Breath sounds: Normal breath sounds.  Abdominal:     Palpations: Abdomen is soft.     Tenderness: There is no abdominal tenderness.  Musculoskeletal:     Cervical back: Neck supple.  Skin:    General: Skin is warm and dry.  Neurological:     Mental Status: He is alert.     ED Results / Procedures / Treatments   Labs (all labs ordered are listed, but only abnormal results are displayed) Labs Reviewed  COMPREHENSIVE METABOLIC PANEL - Abnormal; Notable for the following components:      Result Value   Sodium 126 (*)    Chloride 88 (*)    Glucose, Bld 237 (*)    BUN 30 (*)    Creatinine, Ser 1.54 (*)    Albumin 2.5 (*)    Total Bilirubin 1.7 (*)    GFR, Estimated 51 (*)    All other components within normal limits  CBC WITH DIFFERENTIAL/PLATELET - Abnormal; Notable for the following components:   WBC 28.5 (*)    Hemoglobin 11.2 (*)    HCT 34.5 (*)    RDW 16.1 (*)    Platelets 563 (*)    Neutro Abs 24.8 (*)    Monocytes Absolute 2.9 (*)    All  other components within normal limits  CULTURE, BLOOD (ROUTINE X 2)  CULTURE, BLOOD (ROUTINE X 2)  LACTIC ACID, PLASMA  LACTIC ACID, PLASMA    EKG None  Radiology DG Foot Complete Left  Result Date: 02/06/2021 CLINICAL DATA:  Wound dehiscence EXAM: LEFT FOOT - COMPLETE 3+ VIEW COMPARISON:  None. FINDINGS: Prior 1st and 2nd transmetatarsal amputation. Gas noted throughout the soft tissues of the forefoot. No bone destruction to suggest osteomyelitis. No acute fracture, subluxation or dislocation. IMPRESSION: Gas throughout the soft tissues of the foot. No changes of acute osteomyelitis by plain film. Electronically Signed   By: Rolm Baptise M.D.   On: 02/06/2021 21:29    Medications Ordered in ED Medications  vancomycin (VANCOREADY) IVPB 1500 mg/300 mL (has no administration in time range)  lactated ringers bolus 500 mL (has no administration in time range)  oxyCODONE-acetaminophen (PERCOCET/ROXICET) 5-325 MG per tablet 1 tablet (1 tablet Oral Given 02/06/21 2050)  ceFEPIme (MAXIPIME) 2 g in sodium chloride 0.9 % 100 mL IVPB (0 g Intravenous Stopped 02/06/21 2344)  lactated ringers bolus 1,000 mL (0 mLs Intravenous Stopped 02/07/21 0001)  fentaNYL (SUBLIMAZE) injection 50 mcg (50 mcg Intravenous Given 02/06/21 2313)    ED Course  I have reviewed the triage vital signs and the nursing notes.  Pertinent labs & imaging results that were available during my care of the patient were reviewed by me and considered in my medical decision making (see chart for details).  MDM Rules/Calculators/A&P  Patient's HPI and PE findings are most consistent with wound infection of the left foot. Patient with recent procedure and did not follow wound precautions or follow up. Patient with significant systemic signs of infection including malaise and fevers. Labs today with continued rising leukocytosis. Patient given fluid and antibiotics (vancomycin and cefepime) for empiric treatment of likely infection. Patient  otherwise  treated as needed for pain.   Disposition: Based on the above findings, I believe patient is stable for admission.   Patient/family educated about specific findings on our evaluation and explained exact reasons for admission.  Patient/family educated about clinical situation and time was allowed to answer questions.   Admission team communicated with and agreed with need for admission. Patient admitted. Patient ready to move at this time.    Emergency Department Medication Summary: Medications  vancomycin (VANCOREADY) IVPB 1500 mg/300 mL (has no administration in time range)  lactated ringers bolus 500 mL (has no administration in time range)  oxyCODONE-acetaminophen (PERCOCET/ROXICET) 5-325 MG per tablet 1 tablet (1 tablet Oral Given 02/06/21 2050)  ceFEPIme (MAXIPIME) 2 g in sodium chloride 0.9 % 100 mL IVPB (0 g Intravenous Stopped 02/06/21 2344)  lactated ringers bolus 1,000 mL (0 mLs Intravenous Stopped 02/07/21 0001)  fentaNYL (SUBLIMAZE) injection 50 mcg (50 mcg Intravenous Given 02/06/21 2313)    Final Clinical Impression(s) / ED Diagnoses Final diagnoses:  Wound infection    Rx / DC Orders ED Discharge Orders    None       Tretha Sciara, MD 02/07/21 0025    Tegeler, Gwenyth Allegra, MD 02/08/21 [phone]

## 2021-02-06 NOTE — Telephone Encounter (Signed)
Called pt he has not come in for office visit today following up from conversation Friday. If he is not coming in today would like to sch appt this week. I did see that he sis not go to the ER he has not had any follow up since surgery and we need to see him in the office. Will hold message and try to reach out to patient again.

## 2021-02-06 NOTE — Telephone Encounter (Signed)
Can you please reach put to pt and see if we can get him in this week. He has had no post op eval and had NS his last three visits.

## 2021-02-06 NOTE — ED Triage Notes (Signed)
"  My toe is rotting off."  PT reports pain to the left foot.  "I keep sleeping through my appointments" due to pain medications.  Pt states he is out of medication.  Remaining toes are black and wound is opening up

## 2021-02-06 NOTE — Telephone Encounter (Signed)
Reached out to Scott Greer in regards to home visit. No answer on call. I will continue to reach out during the day today.

## 2021-02-06 NOTE — ED Provider Notes (Incomplete)
[facility] EMERGENCY DEPARTMENT Provider Note   CSN: [id number] Arrival date & time: 02/06/21  2019     History Chief Complaint  Patient presents with  . Foot Pain    Scott Greer is a 65 y.o. male.  HPI Patient is a 65 YO male with a history of DM, HLD, HTN and recent left 2nd toe amputation presenting for dehiscence of the wound site.  Patient states that he missed 2 postoperative visits and now is here with worsening pain and wound site abnormality. Patient endorses fevers/chills and pain in the foot.    Past Medical History:  Diagnosis Date  . Dehiscence of amputation stump (HCC)    left great toe  . Diabetes mellitus without complication ([place]) 5643   Type II  . Glaucoma 2015  . Hyperlipidemia   . Hypertension 2005  . Osteomyelitis ([place])   . Wears glasses     Patient Active Problem List   Diagnosis Date Noted  . Osteomyelitis of second toe of left foot ([place])   . New onset of congestive heart failure ([place]) 12/26/2020  . CKD (chronic kidney disease) stage 3, GFR 30-59 ml/min (HCC) 12/26/2020  . Acute systolic CHF (congestive heart failure) ([place]) 12/26/2020  . Anemia, chronic disease 06/22/2020  . Amputee, great toe, right ([place]) 06/20/2020  . Normocytic anemia 06/20/2020  . Erectile dysfunction associated with type 2 diabetes mellitus ([place]) 06/20/2020  . Positive for macroalbuminuria 10/24/2019  . Amputation of left great toe ([place]) 10/23/2019  . Tobacco dependence 10/23/2019  . Osteomyelitis of great toe of right foot ([place]) 06/04/2019  . Diabetic foot infection ([place])   . Noncompliance with medication regimen   . Glaucoma   . Hyperlipidemia   . Essential hypertension 11/06/2003  . Diabetes mellitus ([place]) 11/05/1997    Past Surgical History:  Procedure Laterality Date  . AMPUTATION Left 06/05/2019   Procedure: LEFT GREAT TOE AMPUTATION;  Surgeon: Newt Minion, MD;  Location: [place];  Service: Orthopedics;  Laterality: Left;  . AMPUTATION  Left 07/10/2019   Procedure: LEFT FOOT 1ST RAY AMPUTATION;  Surgeon: Newt Minion, MD;  Location: [place];  Service: Orthopedics;  Laterality: Left;  . AMPUTATION Right 05/25/2020   Procedure: RIGHT GREAT TOE AMPUTATION;  Surgeon: Newt Minion, MD;  Location: [place];  Service: Orthopedics;  Laterality: Right;  . AMPUTATION Left 01/25/2021   Procedure: LEFT 2ND TOE AMPUTATION;  Surgeon: Newt Minion, MD;  Location: [place];  Service: Orthopedics;  Laterality: Left;  . NO PAST SURGERIES    . RIGHT/LEFT HEART CATH AND CORONARY ANGIOGRAPHY N/A 01/02/2021   Procedure: RIGHT/LEFT HEART CATH AND CORONARY ANGIOGRAPHY;  Surgeon: Lorretta Harp, MD;  Location: [place] [facility];  Service: Cardiovascular;  Laterality: N/A;       Family History  Problem Relation Age of Onset  . Stroke Mother   . Diabetes Mother        Toward end of life    Social History   Tobacco Use  . Smoking status: Current Every Day Smoker    Types: Cigars, Cigarettes  . Smokeless tobacco: Former Systems developer    Types: Chew  . Tobacco comment: 6 daily  Vaping Use  . Vaping Use: Never used  Substance Use Topics  . Alcohol use: Yes    Alcohol/week: 11.0 standard drinks    Types: 3 Cans of beer, 8 Shots of liquor per week    Comment: occasional  . Drug use: Yes  Types: Marijuana    Comment: ocassional - last time 05/08/2020    Home Medications Prior to Admission medications   Medication Sig Start Date End Date Taking? Authorizing Provider  amiodarone (PACERONE) 200 MG tablet Take 1 tablet (200 mg total) by mouth daily. 01/30/21   [name], FNP  aspirin 81 MG chewable tablet Chew 1 tablet (81 mg total) by mouth daily. 01/30/21   [place], Maricela Bo, FNP  atorvastatin (LIPITOR) 10 MG tablet Take 1 tablet (10 mg total) by mouth daily. 01/30/21   [name], FNP  Blood Glucose Monitoring Suppl (TRUE METRIX METER) w/Device KIT Use as directed 10/23/19   [name], MD  digoxin (LANOXIN) 0.125 MG tablet  Take 1 tablet (0.125 mg total) by mouth daily. 01/30/21   [place], Maricela Bo, FNP  doxycycline (VIBRA-TABS) 100 MG tablet Take 1 tablet (100 mg total) by mouth 2 (two) times daily. 01/25/21   Persons, [name], PA  empagliflozin (JARDIANCE) 10 MG TABS tablet Take 1 tablet (10 mg total) by mouth daily. 01/30/21   [name], FNP  glucose blood (TRUE METRIX BLOOD GLUCOSE TEST) test strip Use as instructed 10/23/19   [name], MD  metFORMIN (GLUCOPHAGE) 500 MG tablet Take 1 tablet (500 mg total) by mouth 2 (two) times daily with a meal. 12/14/20   [name], Cari S, PA-C  oxyCODONE-acetaminophen (PERCOCET/ROXICET) 5-325 MG tablet Take 1 tablet by mouth every 4 (four) hours as needed for severe pain. 01/23/21   Newt Minion, MD  Potassium Chloride ER 20 MEQ TBCR Take 20 mEq by mouth daily. 01/30/21   [place], Maricela Bo, FNP  sacubitril-valsartan (ENTRESTO) 97-103 MG Take 1 tablet by mouth 2 (two) times daily. 01/30/21   [name], FNP  spironolactone (ALDACTONE) 25 MG tablet Take 1 tablet (25 mg total) by mouth daily. 01/30/21   [name], FNP  torsemide (DEMADEX) 20 MG tablet Take 3 tablets (60 mg total) by mouth daily. 01/30/21   [name], FNP  TRUEplus Lancets 28G MISC Use as directed 10/23/19   [name], MD  Vitamin D, Ergocalciferol, (DRISDOL) 1.25 MG (50000 UNIT) CAPS capsule Take 1 capsule (50,000 Units total) by mouth every 7 (seven) days. Patient not taking: Reported on 01/30/2021 12/16/20   [name], Cari S, PA-C    Allergies    Bee venom  Review of Systems   Review of Systems  Physical Exam Updated Vital Signs BP 104/76 (BP Location: Left Arm)   Pulse (!) 125   Temp 98.3 F (36.8 C) (Oral)   Resp 20   Ht 6' 3"  (1.905 m)   SpO2 100%   BMI 24.87 kg/m   Physical Exam  ED Results / Procedures / Treatments   Labs (all labs ordered are listed, but only abnormal results are displayed) Labs Reviewed  COMPREHENSIVE METABOLIC PANEL -  Abnormal; Notable for the following components:      Result Value   Sodium 126 (*)    Chloride 88 (*)    Glucose, Bld 237 (*)    BUN 30 (*)    Creatinine, Ser 1.54 (*)    Albumin 2.5 (*)    Total Bilirubin 1.7 (*)    GFR, Estimated 51 (*)    All other components within normal limits  CBC WITH DIFFERENTIAL/PLATELET - Abnormal; Notable for the following components:   WBC 28.5 (*)    Hemoglobin 11.2 (*)    HCT 34.5 (*)    RDW  16.1 (*)    Platelets 563 (*)    Neutro Abs 24.8 (*)    Monocytes Absolute 2.9 (*)    All other components within normal limits  CULTURE, BLOOD (ROUTINE X 2)  CULTURE, BLOOD (ROUTINE X 2)  LACTIC ACID, PLASMA  LACTIC ACID, PLASMA    EKG None  Radiology DG Foot Complete Left  Result Date: 02/06/2021 CLINICAL DATA:  Wound dehiscence EXAM: LEFT FOOT - COMPLETE 3+ VIEW COMPARISON:  None. FINDINGS: Prior 1st and 2nd transmetatarsal amputation. Gas noted throughout the soft tissues of the forefoot. No bone destruction to suggest osteomyelitis. No acute fracture, subluxation or dislocation. IMPRESSION: Gas throughout the soft tissues of the foot. No changes of acute osteomyelitis by plain film. Electronically Signed   By: Rolm Baptise M.D.   On: 02/06/2021 21:29    Procedures Procedures {Remember to document critical care time when appropriate:1}  Medications Ordered in ED Medications  oxyCODONE-acetaminophen (PERCOCET/ROXICET) 5-325 MG per tablet 1 tablet (1 tablet Oral Given 02/06/21 2050)    ED Course  I have reviewed the triage vital signs and the nursing notes.  Pertinent labs & imaging results that were available during my care of the patient were reviewed by me and considered in my medical decision making (see chart for details).    MDM Rules/Calculators/A&P                          *** Final Clinical Impression(s) / ED Diagnoses Final diagnoses:  None    Rx / DC Orders ED Discharge Orders    None

## 2021-02-06 NOTE — ED Triage Notes (Signed)
Emergency Medicine Provider Triage Evaluation Note  Scott Greer , a 65 y.o. male  was evaluated in triage.  Pt complains of pain in his left foot.  He states that he has slept through his follow-up appointments with Dr. Lajoyce Corners and noted that his wound is opening back up after his toe amputation..  No fevers.  He is run out of his pain medication.  Physical Exam  BP 104/76 (BP Location: Left Arm)   Pulse (!) 125   Temp 98.3 F (36.8 C) (Oral)   Resp 20   Ht 6\' 3"  (1.905 m)   SpO2 100%   BMI 24.87 kg/m  Patient is awake and alert, he appears uncomfortable.  Dressing is partially removed on the left foot showing wound dehiscence with what appears to be purulent material.  Additionally it appears that his remaining toes on his left foot are necrotic.  Medical Decision Making  Medically screening exam initiated at 8:47 PM.  Appropriate orders placed.  TRAYON KRANTZ was informed that the remainder of the evaluation will be completed by another provider, this initial triage assessment does not replace that evaluation, and the importance of remaining in the ED until their evaluation is complete.  Given the patient has a clear source of infection and is tachycardic lactic acid and blood cultures are sent in addition to basic labs.  At this point he does not have full SIRS criteria, pending white count.  Clinical Impression  Foot infection   [name], Cristina Gong 02/06/21 2049

## 2021-02-07 ENCOUNTER — Other Ambulatory Visit: Payer: Self-pay | Admitting: Physician Assistant

## 2021-02-07 ENCOUNTER — Encounter (HOSPITAL_COMMUNITY): Payer: Self-pay | Admitting: Internal Medicine

## 2021-02-07 DIAGNOSIS — E1152 Type 2 diabetes mellitus with diabetic peripheral angiopathy with gangrene: Secondary | ICD-10-CM | POA: Diagnosis not present

## 2021-02-07 DIAGNOSIS — E871 Hypo-osmolality and hyponatremia: Secondary | ICD-10-CM

## 2021-02-07 DIAGNOSIS — L089 Local infection of the skin and subcutaneous tissue, unspecified: Secondary | ICD-10-CM

## 2021-02-07 DIAGNOSIS — E1169 Type 2 diabetes mellitus with other specified complication: Secondary | ICD-10-CM | POA: Diagnosis not present

## 2021-02-07 DIAGNOSIS — Y835 Amputation of limb(s) as the cause of abnormal reaction of the patient, or of later complication, without mention of misadventure at the time of the procedure: Secondary | ICD-10-CM | POA: Diagnosis present

## 2021-02-07 DIAGNOSIS — D75839 Thrombocytosis, unspecified: Secondary | ICD-10-CM

## 2021-02-07 DIAGNOSIS — H409 Unspecified glaucoma: Secondary | ICD-10-CM | POA: Diagnosis present

## 2021-02-07 DIAGNOSIS — T148XXA Other injury of unspecified body region, initial encounter: Secondary | ICD-10-CM | POA: Diagnosis not present

## 2021-02-07 DIAGNOSIS — I10 Essential (primary) hypertension: Secondary | ICD-10-CM | POA: Diagnosis not present

## 2021-02-07 DIAGNOSIS — D72829 Elevated white blood cell count, unspecified: Secondary | ICD-10-CM | POA: Diagnosis not present

## 2021-02-07 DIAGNOSIS — E785 Hyperlipidemia, unspecified: Secondary | ICD-10-CM | POA: Diagnosis not present

## 2021-02-07 DIAGNOSIS — N179 Acute kidney failure, unspecified: Secondary | ICD-10-CM

## 2021-02-07 DIAGNOSIS — I5042 Chronic combined systolic (congestive) and diastolic (congestive) heart failure: Secondary | ICD-10-CM | POA: Diagnosis not present

## 2021-02-07 DIAGNOSIS — I130 Hypertensive heart and chronic kidney disease with heart failure and stage 1 through stage 4 chronic kidney disease, or unspecified chronic kidney disease: Secondary | ICD-10-CM | POA: Diagnosis not present

## 2021-02-07 DIAGNOSIS — L02612 Cutaneous abscess of left foot: Secondary | ICD-10-CM | POA: Diagnosis not present

## 2021-02-07 DIAGNOSIS — E875 Hyperkalemia: Secondary | ICD-10-CM | POA: Diagnosis not present

## 2021-02-07 DIAGNOSIS — E8809 Other disorders of plasma-protein metabolism, not elsewhere classified: Secondary | ICD-10-CM | POA: Diagnosis not present

## 2021-02-07 DIAGNOSIS — D75838 Other thrombocytosis: Secondary | ICD-10-CM | POA: Diagnosis present

## 2021-02-07 DIAGNOSIS — I428 Other cardiomyopathies: Secondary | ICD-10-CM | POA: Diagnosis not present

## 2021-02-07 DIAGNOSIS — Z9119 Patient's noncompliance with other medical treatment and regimen: Secondary | ICD-10-CM

## 2021-02-07 DIAGNOSIS — E1165 Type 2 diabetes mellitus with hyperglycemia: Secondary | ICD-10-CM | POA: Diagnosis not present

## 2021-02-07 DIAGNOSIS — S91302A Unspecified open wound, left foot, initial encounter: Secondary | ICD-10-CM | POA: Diagnosis not present

## 2021-02-07 DIAGNOSIS — N1831 Chronic kidney disease, stage 3a: Secondary | ICD-10-CM | POA: Diagnosis present

## 2021-02-07 DIAGNOSIS — G8918 Other acute postprocedural pain: Secondary | ICD-10-CM | POA: Diagnosis not present

## 2021-02-07 DIAGNOSIS — I96 Gangrene, not elsewhere classified: Secondary | ICD-10-CM | POA: Diagnosis not present

## 2021-02-07 DIAGNOSIS — E440 Moderate protein-calorie malnutrition: Secondary | ICD-10-CM | POA: Diagnosis not present

## 2021-02-07 DIAGNOSIS — I5022 Chronic systolic (congestive) heart failure: Secondary | ICD-10-CM | POA: Diagnosis not present

## 2021-02-07 DIAGNOSIS — E782 Mixed hyperlipidemia: Secondary | ICD-10-CM | POA: Diagnosis not present

## 2021-02-07 DIAGNOSIS — I110 Hypertensive heart disease with heart failure: Secondary | ICD-10-CM | POA: Diagnosis present

## 2021-02-07 DIAGNOSIS — M869 Osteomyelitis, unspecified: Secondary | ICD-10-CM | POA: Diagnosis not present

## 2021-02-07 DIAGNOSIS — E1122 Type 2 diabetes mellitus with diabetic chronic kidney disease: Secondary | ICD-10-CM | POA: Diagnosis present

## 2021-02-07 DIAGNOSIS — T8130XA Disruption of wound, unspecified, initial encounter: Secondary | ICD-10-CM | POA: Diagnosis not present

## 2021-02-07 DIAGNOSIS — E11621 Type 2 diabetes mellitus with foot ulcer: Secondary | ICD-10-CM | POA: Diagnosis present

## 2021-02-07 DIAGNOSIS — T8131XA Disruption of external operation (surgical) wound, not elsewhere classified, initial encounter: Secondary | ICD-10-CM | POA: Diagnosis not present

## 2021-02-07 DIAGNOSIS — F17210 Nicotine dependence, cigarettes, uncomplicated: Secondary | ICD-10-CM | POA: Diagnosis present

## 2021-02-07 DIAGNOSIS — L97529 Non-pressure chronic ulcer of other part of left foot with unspecified severity: Secondary | ICD-10-CM | POA: Diagnosis not present

## 2021-02-07 DIAGNOSIS — E46 Unspecified protein-calorie malnutrition: Secondary | ICD-10-CM

## 2021-02-07 DIAGNOSIS — Z20822 Contact with and (suspected) exposure to covid-19: Secondary | ICD-10-CM | POA: Diagnosis not present

## 2021-02-07 DIAGNOSIS — M86172 Other acute osteomyelitis, left ankle and foot: Secondary | ICD-10-CM | POA: Diagnosis not present

## 2021-02-07 HISTORY — DX: Local infection of the skin and subcutaneous tissue, unspecified: L08.9

## 2021-02-07 LAB — CBC
HCT: 33.6 % — ABNORMAL LOW (ref 39.0–52.0)
Hemoglobin: 10.7 g/dL — ABNORMAL LOW (ref 13.0–17.0)
MCH: 26.8 pg (ref 26.0–34.0)
MCHC: 31.8 g/dL (ref 30.0–36.0)
MCV: 84.2 fL (ref 80.0–100.0)
Platelets: 445 10*3/uL — ABNORMAL HIGH (ref 150–400)
RBC: 3.99 MIL/uL — ABNORMAL LOW (ref 4.22–5.81)
RDW: 16.1 % — ABNORMAL HIGH (ref 11.5–15.5)
WBC: 25.2 10*3/uL — ABNORMAL HIGH (ref 4.0–10.5)
nRBC: 0 % (ref 0.0–0.2)

## 2021-02-07 LAB — COMPREHENSIVE METABOLIC PANEL
ALT: 11 U/L (ref 0–44)
AST: 15 U/L (ref 15–41)
Albumin: 2.3 g/dL — ABNORMAL LOW (ref 3.5–5.0)
Alkaline Phosphatase: 88 U/L (ref 38–126)
Anion gap: 10 (ref 5–15)
BUN: 32 mg/dL — ABNORMAL HIGH (ref 8–23)
CO2: 27 mmol/L (ref 22–32)
Calcium: 8.8 mg/dL — ABNORMAL LOW (ref 8.9–10.3)
Chloride: 92 mmol/L — ABNORMAL LOW (ref 98–111)
Creatinine, Ser: 1.45 mg/dL — ABNORMAL HIGH (ref 0.61–1.24)
GFR, Estimated: 54 mL/min — ABNORMAL LOW (ref >60)
Glucose, Bld: 163 mg/dL — ABNORMAL HIGH (ref 70–99)
Potassium: 4.4 mmol/L (ref 3.5–5.1)
Sodium: 129 mmol/L — ABNORMAL LOW (ref 135–145)
Total Bilirubin: 0.8 mg/dL (ref 0.3–1.2)
Total Protein: 6.7 g/dL (ref 6.5–8.1)

## 2021-02-07 LAB — OSMOLALITY: Osmolality: 290 mOsm/kg (ref 275–295)

## 2021-02-07 LAB — HEMOGLOBIN A1C
Hgb A1c MFr Bld: 8.6 % — ABNORMAL HIGH (ref 4.8–5.6)
Mean Plasma Glucose: 200.12 mg/dL

## 2021-02-07 LAB — CBG MONITORING, ED
Glucose-Capillary: 154 mg/dL — ABNORMAL HIGH (ref 70–99)
Glucose-Capillary: 134 mg/dL — ABNORMAL HIGH (ref 70–99)

## 2021-02-07 LAB — PHOSPHORUS: Phosphorus: 3.2 mg/dL (ref 2.5–4.6)

## 2021-02-07 LAB — MAGNESIUM: Magnesium: 1.8 mg/dL (ref 1.7–2.4)

## 2021-02-07 LAB — SURGICAL PCR SCREEN
MRSA, PCR: NEGATIVE
Staphylococcus aureus: NEGATIVE

## 2021-02-07 LAB — SARS CORONAVIRUS 2 (TAT 6-24 HRS): SARS Coronavirus 2: NEGATIVE

## 2021-02-07 LAB — LACTIC ACID, PLASMA: Lactic Acid, Venous: 1.6 mmol/L (ref 0.5–1.9)

## 2021-02-07 LAB — APTT: aPTT: 34 seconds (ref 24–36)

## 2021-02-07 LAB — PROTIME-INR
INR: 1.3 — ABNORMAL HIGH (ref 0.8–1.2)
Prothrombin Time: 15.7 seconds — ABNORMAL HIGH (ref 11.4–15.2)

## 2021-02-07 LAB — GLUCOSE, CAPILLARY
Glucose-Capillary: 167 mg/dL — ABNORMAL HIGH (ref 70–99)
Glucose-Capillary: 157 mg/dL — ABNORMAL HIGH (ref 70–99)
Glucose-Capillary: 160 mg/dL — ABNORMAL HIGH (ref 70–99)

## 2021-02-07 LAB — SODIUM, URINE, RANDOM: Sodium, Ur: 12 mmol/L

## 2021-02-07 LAB — OSMOLALITY, URINE: Osmolality, Ur: 485 mOsm/kg (ref 300–900)

## 2021-02-07 MED ORDER — MORPHINE SULFATE (PF) 2 MG/ML IV SOLN
2.0000 mg | INTRAVENOUS | Status: DC | PRN
  Administered 2021-02-07 – 2021-02-08 (×5): 2 mg via INTRAVENOUS
  Filled 2021-02-07 (×5): qty 1

## 2021-02-07 MED ORDER — VANCOMYCIN HCL 1750 MG/350ML IV SOLN
1750.0000 mg | INTRAVENOUS | Status: DC
  Administered 2021-02-07: 1750 mg via INTRAVENOUS
  Filled 2021-02-07 (×6): qty 350

## 2021-02-07 MED ORDER — VANCOMYCIN HCL 10 G IV SOLR
1750.0000 mg | INTRAVENOUS | Status: DC
  Filled 2021-02-07: qty 1750

## 2021-02-07 MED ORDER — ACETAMINOPHEN 325 MG PO TABS
650.0000 mg | ORAL_TABLET | Freq: Four times a day (QID) | ORAL | Status: DC | PRN
  Administered 2021-02-12 – 2021-02-14 (×4): 650 mg via ORAL
  Filled 2021-02-07 (×4): qty 2

## 2021-02-07 MED ORDER — ONDANSETRON HCL 4 MG/2ML IJ SOLN: 4.0000 mg | Freq: Four times a day (QID) | INTRAMUSCULAR | Status: DC | PRN

## 2021-02-07 MED ORDER — LACTATED RINGERS IV BOLUS
500.0000 mL | Freq: Once | INTRAVENOUS | Status: AC
  Administered 2021-02-07: 500 mL via INTRAVENOUS

## 2021-02-07 MED ORDER — INSULIN ASPART 100 UNIT/ML ~~LOC~~ SOLN
0.0000 [IU] | SUBCUTANEOUS | Status: DC
  Administered 2021-02-07 (×4): 3 [IU] via SUBCUTANEOUS
  Administered 2021-02-08 (×3): 2 [IU] via SUBCUTANEOUS

## 2021-02-07 MED ORDER — HYDROMORPHONE HCL 1 MG/ML IJ SOLN
1.0000 mg | INTRAMUSCULAR | Status: DC | PRN
  Administered 2021-02-07 – 2021-02-11 (×10): 1 mg via INTRAVENOUS
  Filled 2021-02-07 (×11): qty 1

## 2021-02-07 MED ORDER — SODIUM CHLORIDE 0.9 % IV SOLN: INTRAVENOUS | Status: DC

## 2021-02-07 MED ORDER — GLUCERNA SHAKE PO LIQD
237.0000 mL | Freq: Three times a day (TID) | ORAL | Status: DC
  Administered 2021-02-07 – 2021-02-15 (×17): 237 mL via ORAL
  Filled 2021-02-07 (×3): qty 237

## 2021-02-07 MED ORDER — CEFAZOLIN SODIUM-DEXTROSE 2-4 GM/100ML-% IV SOLN
2.0000 g | INTRAVENOUS | Status: AC
  Administered 2021-02-08: 2 g via INTRAVENOUS
  Filled 2021-02-07 (×2): qty 100

## 2021-02-07 MED ORDER — SODIUM CHLORIDE 0.9 % IV SOLN: 2.0000 g | Freq: Two times a day (BID) | INTRAVENOUS | Status: DC

## 2021-02-07 MED ORDER — SODIUM CHLORIDE 0.9 % IV SOLN
2.0000 g | Freq: Three times a day (TID) | INTRAVENOUS | Status: DC
  Administered 2021-02-07 – 2021-02-12 (×15): 2 g via INTRAVENOUS
  Filled 2021-02-07 (×15): qty 2

## 2021-02-07 NOTE — Progress Notes (Signed)
Pt seen in room, alert/oriented in no apparent distress.Orientated to room/equipments . Welcome guide/menu provided to pt with instructions. Pt verbalized understanding of instructions. Hospital valuables policy has been discussed. No complaints. Bed in lowest position with call bell /room  Phone within reach and all wheels locked.

## 2021-02-07 NOTE — Consult Note (Signed)
  ORTHOPAEDIC CONSULTATION  REQUESTING PHYSICIAN: Joseph, Preetha, MD  Chief Complaint: Necrotic ulcer with purulent drainage left foot.  HPI: Scott Greer is a 65 y.o. male who presents with purulent abscess with wound dehiscence of the first and second ray amputations left foot.  Patient states that he missed his follow-up appointments due to sleeping and he said he slept until 3 and 5:00 in the afternoon.  Patient states he is been walking on his heel and has foul-smelling drainage.  Past Medical History:  Diagnosis Date  . Dehiscence of amputation stump (HCC)    left great toe  . Diabetes mellitus without complication (HCC) 1999   Type II  . Glaucoma 2015  . Hyperlipidemia   . Hypertension 2005  . Left foot infection 02/07/2021  . Osteomyelitis (HCC)   . Wears glasses    Past Surgical History:  Procedure Laterality Date  . AMPUTATION Left 06/05/2019   Procedure: LEFT GREAT TOE AMPUTATION;  Surgeon: Duda, Marcus V, MD;  Location: [facility];  Service: Orthopedics;  Laterality: Left;  . AMPUTATION Left 07/10/2019   Procedure: LEFT FOOT 1ST RAY AMPUTATION;  Surgeon: Duda, Marcus V, MD;  Location: [facility];  Service: Orthopedics;  Laterality: Left;  . AMPUTATION Right 05/25/2020   Procedure: RIGHT GREAT TOE AMPUTATION;  Surgeon: Duda, Marcus V, MD;  Location: [facility];  Service: Orthopedics;  Laterality: Right;  . AMPUTATION Left 01/25/2021   Procedure: LEFT 2ND TOE AMPUTATION;  Surgeon: Duda, Marcus V, MD;  Location: [facility];  Service: Orthopedics;  Laterality: Left;  . NO PAST SURGERIES    . RIGHT/LEFT HEART CATH AND CORONARY ANGIOGRAPHY N/A 01/02/2021   Procedure: RIGHT/LEFT HEART CATH AND CORONARY ANGIOGRAPHY;  Surgeon: Berry, Jonathan J, MD;  Location: [facility];  Service: Cardiovascular;  Laterality: N/A;   Social History   Socioeconomic History  . Marital status: Legally Separated    Spouse name: Tawana  . Number of children: 2  . Years of education: Not on file  .  Highest education level: Associate degree: occupational, technical, or vocational program  Occupational History  . Not on file  Tobacco Use  . Smoking status: Current Every Day Smoker    Types: Cigars, Cigarettes  . Smokeless tobacco: Former User    Types: Chew  . Tobacco comment: 6 daily  Vaping Use  . Vaping Use: Never used  Substance and Sexual Activity  . Alcohol use: Yes    Alcohol/week: 11.0 standard drinks    Types: 3 Cans of beer, 8 Shots of liquor per week    Comment: occasional  . Drug use: Yes    Types: Marijuana    Comment: ocassional - last time 05/08/2020  . Sexual activity: Not Currently  Other Topics Concern  . Not on file  Social History Narrative   Out of prison for 2 months.   Lives at home with his wife.   Social Determinants of Health   Financial Resource Strain: High Risk  . Difficulty of Paying Living Expenses: Very hard  Food Insecurity: Food Insecurity Present  . Worried About Running Out of Food in the Last Year: Sometimes true  . Ran Out of Food in the Last Year: Sometimes true  Transportation Needs: Unmet Transportation Needs  . Lack of Transportation (Medical): Yes  . Lack of Transportation (Non-Medical): Yes  Physical Activity: Inactive  . Days of Exercise per Week: 0 days  . Minutes of Exercise per Session: 0 min  Stress: Not on file    Social Connections: Not on file   Family History  Problem Relation Age of Onset  . Stroke Mother   . Diabetes Mother        Toward end of life   - negative except otherwise stated in the family history section Allergies  Allergen Reactions  . Bee Venom Hives, Itching and Swelling   Prior to Admission medications   Medication Sig Start Date End Date Taking? Authorizing Provider  lisinopril (ZESTRIL) 10 MG tablet Take 10 mg by mouth daily.   Yes [provider]  amiodarone (PACERONE) 200 MG tablet Take 1 tablet (200 mg total) by mouth daily. 01/30/21   [place], Jessica M, FNP  aspirin 81 MG  chewable tablet Chew 1 tablet (81 mg total) by mouth daily. 01/30/21   [place], Jessica M, FNP  atorvastatin (LIPITOR) 10 MG tablet Take 1 tablet (10 mg total) by mouth daily. 01/30/21   [place], Jessica M, FNP  Blood Glucose Monitoring Suppl (TRUE METRIX METER) w/Device KIT Use as directed 10/23/19   Johnson, Deborah B, MD  digoxin (LANOXIN) 0.125 MG tablet Take 1 tablet (0.125 mg total) by mouth daily. 01/30/21   [place], Jessica M, FNP  doxycycline (VIBRA-TABS) 100 MG tablet Take 1 tablet (100 mg total) by mouth 2 (two) times daily. 01/25/21   Persons, Mary Anne, PA  empagliflozin (JARDIANCE) 10 MG TABS tablet Take 1 tablet (10 mg total) by mouth daily. 01/30/21   [place], Jessica M, FNP  glucose blood (TRUE METRIX BLOOD GLUCOSE TEST) test strip Use as instructed 10/23/19   Johnson, Deborah B, MD  metFORMIN (GLUCOPHAGE) 500 MG tablet Take 1 tablet (500 mg total) by mouth 2 (two) times daily with a meal. 12/14/20   [name], Cari S, PA-C  oxyCODONE-acetaminophen (PERCOCET/ROXICET) 5-325 MG tablet Take 1 tablet by mouth every 4 (four) hours as needed for severe pain. 01/23/21   Duda, Marcus V, MD  Potassium Chloride ER 20 MEQ TBCR Take 20 mEq by mouth daily. 01/30/21   [place], Jessica M, FNP  sacubitril-valsartan (ENTRESTO) 97-103 MG Take 1 tablet by mouth 2 (two) times daily. 01/30/21   [place], Jessica M, FNP  spironolactone (ALDACTONE) 25 MG tablet Take 1 tablet (25 mg total) by mouth daily. 01/30/21   [place], Jessica M, FNP  torsemide (DEMADEX) 20 MG tablet Take 3 tablets (60 mg total) by mouth daily. 01/30/21   [place], Jessica M, FNP  TRUEplus Lancets 28G MISC Use as directed 10/23/19   Johnson, Deborah B, MD  Vitamin D, Ergocalciferol, (DRISDOL) 1.25 MG (50000 UNIT) CAPS capsule Take 1 capsule (50,000 Units total) by mouth every 7 (seven) days. Patient not taking: Reported on 01/30/2021 12/16/20   [name], Cari S, PA-C   DG Foot Complete Left  Result Date: 02/06/2021 CLINICAL DATA:  Wound dehiscence EXAM:  LEFT FOOT - COMPLETE 3+ VIEW COMPARISON:  None. FINDINGS: Prior 1st and 2nd transmetatarsal amputation. Gas noted throughout the soft tissues of the forefoot. No bone destruction to suggest osteomyelitis. No acute fracture, subluxation or dislocation. IMPRESSION: Gas throughout the soft tissues of the foot. No changes of acute osteomyelitis by plain film. Electronically Signed   By: Kevin  Dover M.D.   On: 02/06/2021 21:29   - pertinent xrays, CT, MRI studies were reviewed and independently interpreted  Positive ROS: All other systems have been reviewed and were otherwise negative with the exception of those mentioned in the HPI and as above.  Physical Exam: General: Alert, no acute distress Psychiatric: Patient is competent for consent with   normal mood and affect Lymphatic: No axillary or cervical lymphadenopathy Cardiovascular: No pedal edema Respiratory: No cyanosis, no use of accessory musculature GI: No organomegaly, abdomen is soft and non-tender    Images:  @ENCIMAGES@  Labs:  Lab Results  Component Value Date   HGBA1C 8.6 (H) 02/07/2021   HGBA1C 8.4 (A) 12/14/2020   HGBA1C 7.1 (A) 06/20/2020   ESRSEDRATE 78 (H) 06/04/2019   CRP 4.5 (H) 06/04/2019   REPTSTATUS PENDING 02/06/2021   REPTSTATUS PENDING 02/06/2021   CULT  02/06/2021    NO GROWTH < 12 HOURS Performed at [facility], [address] [place], [place] [address]    CULT  02/06/2021    NO GROWTH < 12 HOURS Performed at [facility], [address] , [place] [address]     Lab Results  Component Value Date   ALBUMIN 2.3 (L) 02/07/2021   ALBUMIN 2.5 (L) 02/06/2021   ALBUMIN 3.1 (L) 12/25/2020   PREALBUMIN 13.8 (L) 06/04/2019    No results found for: CBC  Neurologic: Patient does not have protective sensation bilateral lower extremities.   MUSCULOSKELETAL:   Skin: Examination there is wound dehiscence with necrotic edges from the first and second ray amputation.  The third toe is  black and necrotic.  There is purulent drainage from the wound.  Patient's foot is globally tender to palpation.  I cannot palpate pulses the Doppler was used and patient does have a strong biphasic dorsalis pedis and posterior tibial pulse there is also a strong anterior tibial pulse.  Hemoglobin A1c 8.6.  Albumin 2.3.  Hemoglobin 10.7 and white cell count 25.2  Assessment: Assessment: Peripheral vascular disease with wound dehiscence of the first and second ray amputation with purulent drainage and dry gangrenous changes to the third toe.  Plan: I have recommended patient proceed with a transtibial amputation.  Patient states he could not consider a transtibial amputation at this time.  Will proceed with a transmetatarsal amputation.  Discussed that this has about a 25% chance of healing.  Discussed that if this does not heal we would need to proceed with a transtibial amputation.  Patient states he understands and wishes to proceed with foot salvage surgery.  We will plan for a transmetatarsal amputation tomorrow.  Thank you for the consult and the opportunity to see Mr. Brewbaker  Marcus Duda, MD [facility] [phone] 12:34 PM     

## 2021-02-07 NOTE — Progress Notes (Signed)
Pharmacy Antibiotic Note  Scott Greer is a 65 y.o. male admitted on 02/06/2021 with wound infection.  Pharmacy has been consulted for vancomycin and cefepime dosing. Vancomycin 1500 mg and cefepime 2gm given in ED  Plan: Continue vancomycin 1750 mg IV q24 hours Cont cefepime 2gm IV q12 hours F/u renal function, cultures and clinical course  Height: 6\' 3"  (190.5 cm) IBW/kg (Calculated) : 84.5  Temp (24hrs), Avg:98.4 F (36.9 C), Min:98.3 F (36.8 C), Max:98.5 F (36.9 C)  Recent Labs  Lab 02/06/21 2057 02/06/21 2245 02/07/21 0346  WBC 28.5*  --  25.2*  CREATININE 1.54*  --   --   LATICACIDVEN 1.9 1.6  --     Estimated Creatinine Clearance: 59.4 mL/min (A) (by C-G formula based on SCr of 1.54 mg/dL (H)).    Allergies  Allergen Reactions  . Bee Venom Hives, Itching and Swelling      Thank you for allowing pharmacy to be a part of this patient's care.  04/09/21 [place] 02/07/2021 4:25 AM

## 2021-02-07 NOTE — Telephone Encounter (Signed)
Called pt VM full so I was unable to leave a message but I will try again later.

## 2021-02-07 NOTE — Progress Notes (Addendum)
Patient seen and examined, admitted earlier this morning by [facility],  -Briefly [name] is a 65 year old male with history of type 2 diabetes mellitus, tobacco abuse, hypertension and dyslipidemia recently underwent second toe ray amputation on 3/23 by Dr. Lajoyce Corners. -Subsequently missed 2 postop appointments, presented to the ED last night with worsening pain, wound dehiscence, swelling and foul-smelling drainage. -In the ED he was noted to have a white count of 28K, creatinine was 1.5, sodium was 126, plain film x-ray noted soft tissue air  Left foot wound dehiscence Secondary infection PAD Noncompliance Sepsis -Continue IV vancomycin and cefepime today -Continue IV fluids today -Follow-up blood cultures -Discussed with Dr. Lajoyce Corners, he will consult, n.p.o. after midnight  Acute kidney injury -Creatinine 1.5 on admission, baseline around 1.1-1.3 -Continue gentle IV fluids, caution with vancomycin dosing  Uncontrolled type 2 diabetes mellitus -Check hemoglobin A1c -Oral hypoglycemics on hold, CBGs are stable continue sliding scale insulin today  Rest as noted by Dr.Adefaso this am  Zannie Cove MD

## 2021-02-07 NOTE — Progress Notes (Signed)
PT Cancellation Note  Patient Details Name: Scott Greer MRN: [id number] DOB: 1956-08-20   Cancelled Treatment:    Reason Eval/Treat Not Completed: Other (comment) - pt plan for transmetatarsal amputation tomorrow, will check back post-operatively.   Marye Round, PT Acute Rehabilitation Services Pager [phone]  Office [phone]    Truddie Coco 02/07/2021, 3:03 PM

## 2021-02-07 NOTE — Progress Notes (Signed)
Pharmacy Antibiotic Note  Scott Greer is a 65 y.o. male admitted on 02/06/2021 with L foot infection s/p 2nd toe amputation on 01/25/2021.  Pharmacy has been consulted for vancomycin and cefepime dosing. Scr 1.45 with current CrCl ~63 ml/min. Baseline Scr ~1.5-1.6. WBC 25.2   Plan: Adjust cefepime to 2g IV q8h for CrCl >60  Continue vancomycin 1750 q24h (eAUC 474 using Scr 1.45) Monitor renal function, cultures, and clinical progression  Height: 6\' 3"  (190.5 cm) IBW/kg (Calculated) : 84.5  Temp (24hrs), Avg:98.5 F (36.9 C), Min:98.3 F (36.8 C), Max:98.7 F (37.1 C)  Recent Labs  Lab 02/06/21 2057 02/06/21 2245 02/07/21 0346  WBC 28.5*  --  25.2*  CREATININE 1.54*  --  1.45*  LATICACIDVEN 1.9 1.6  --     Estimated Creatinine Clearance: 63.1 mL/min (A) (by C-G formula based on SCr of 1.45 mg/dL (H)).    Allergies  Allergen Reactions  . Bee Venom Hives, Itching and Swelling    Antimicrobials this admission: Cefepime 4/4 >> Vancomycin 4/5 >>  Dose adjustments this admission: 4/5: cefepime adjusted from 2g q12h to 2g q8h based on CrCl   Microbiology results: 4/4 Bcx: ngtd  Thank you for allowing pharmacy to be a part of this patient's care.  6/4, PharmD Clinical Pharmacist  02/07/2021 8:46 AM

## 2021-02-07 NOTE — H&P (Signed)
History and Physical  Scott Greer ZHG:[id number] DOB: [birth year] DOA: 02/06/2021  Referring physician: Tretha Sciara, MD PCP: [name], MD  Patient coming from: Home  Chief Complaint: Left foot pain  HPI: Scott Greer is a 65 y.o. male with medical history significant for hypertension, hyperlipidemia, T2DM and recent left second toe amputation who presents to the emergency department due to left foot pain from the wound site.  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 Sharol Given, he has missed 2 postoperative visits due to oversleeping and not being able to make it to the appointment.  He noted that the wound opened again and due to ongoing pain in the foot, he decided to go to the ED for further evaluation.  He denies fever, chest pain, shortness of breath, nausea or vomiting.   ED Course:  In the emergency department, he was hemodynamically stable except for soft BP at 104/76.  Work-up in the ED showed leukocytosis, thrombocytosis, hyponatremia, BUNs/creatinine 30/1.54(baseline creatinine at 1.1-1.3)  Left foot x-ray showed gas throughout the soft tissues of the foot.  No changes of acute osteomyelitis by plain film.  He was treated with IV cefepime and IV vancomycin.  IV hydration was provided and IV fentanyl was given for pain.  Hospitalist was asked to admit patient for further evaluation and management.  Review of Systems:  Constitutional: Negative for chills and fever.  HENT: Negative for ear pain and sore throat.   Eyes: Negative for pain and visual disturbance.  Respiratory: Negative for cough, chest tightness and shortness of breath.   Cardiovascular: Negative for chest pain and palpitations.  Gastrointestinal: Negative for abdominal pain and vomiting.  Endocrine: Negative for polyphagia and polyuria.  Genitourinary: Negative for decreased urine volume, dysuria, enuresis Musculoskeletal: Pain due to left second toe open  wound with drainage.    Skin: Negative for color change and rash.  Allergic/Immunologic: Negative for immunocompromised state.  Neurological: Negative for tremors, syncope, speech difficulty, weakness, light-headedness and headaches.  Hematological: Does not bruise/bleed easily.  All other systems reviewed and are negative   Past Medical History:  Diagnosis Date  . Dehiscence of amputation stump (HCC)    left great toe  . Diabetes mellitus without complication ([place]) 1962   Type II  . Glaucoma 2015  . Hyperlipidemia   . Hypertension 2005  . Osteomyelitis ([place])   . Wears glasses    Past Surgical History:  Procedure Laterality Date  . AMPUTATION Left 06/05/2019   Procedure: LEFT GREAT TOE AMPUTATION;  Surgeon: Newt Minion, MD;  Location: [facility];  Service: Orthopedics;  Laterality: Left;  . AMPUTATION Left 07/10/2019   Procedure: LEFT FOOT 1ST RAY AMPUTATION;  Surgeon: Newt Minion, MD;  Location: [facility];  Service: Orthopedics;  Laterality: Left;  . AMPUTATION Right 05/25/2020   Procedure: RIGHT GREAT TOE AMPUTATION;  Surgeon: Newt Minion, MD;  Location: [place] [facility];  Service: Orthopedics;  Laterality: Right;  . AMPUTATION Left 01/25/2021   Procedure: LEFT 2ND TOE AMPUTATION;  Surgeon: Newt Minion, MD;  Location: [place];  Service: Orthopedics;  Laterality: Left;  . NO PAST SURGERIES    . RIGHT/LEFT HEART CATH AND CORONARY ANGIOGRAPHY N/A 01/02/2021   Procedure: RIGHT/LEFT HEART CATH AND CORONARY ANGIOGRAPHY;  Surgeon: Lorretta Harp, MD;  Location: [facility];  Service: Cardiovascular;  Laterality: N/A;    Social History:  reports that he has been smoking cigars and cigarettes.  He has quit using smokeless tobacco.  His smokeless tobacco use included chew. He reports current alcohol use of about 11.0 standard drinks of alcohol per week. He reports current drug use. Drug: Marijuana.   Allergies  Allergen Reactions  . Bee Venom Hives, Itching and Swelling    Family History   Problem Relation Age of Onset  . Stroke Mother   . Diabetes Mother        Toward end of life    Prior to Admission medications   Medication Sig Start Date End Date Taking? Authorizing Provider  lisinopril (ZESTRIL) 10 MG tablet Take 10 mg by mouth daily.   Yes [provider]  amiodarone (PACERONE) 200 MG tablet Take 1 tablet (200 mg total) by mouth daily. 01/30/21   [name], FNP  aspirin 81 MG chewable tablet Chew 1 tablet (81 mg total) by mouth daily. 01/30/21   Milford, Anderson Malta, FNP  atorvastatin (LIPITOR) 10 MG tablet Take 1 tablet (10 mg total) by mouth daily. 01/30/21   [name], FNP  Blood Glucose Monitoring Suppl (TRUE METRIX METER) w/Device KIT Use as directed 10/23/19   [name], MD  digoxin (LANOXIN) 0.125 MG tablet Take 1 tablet (0.125 mg total) by mouth daily. 01/30/21   Milford, Anderson Malta, FNP  doxycycline (VIBRA-TABS) 100 MG tablet Take 1 tablet (100 mg total) by mouth 2 (two) times daily. 01/25/21   Persons, [name], PA  empagliflozin (JARDIANCE) 10 MG TABS tablet Take 1 tablet (10 mg total) by mouth daily. 01/30/21   [name], FNP  glucose blood (TRUE METRIX BLOOD GLUCOSE TEST) test strip Use as instructed 10/23/19   [name], MD  metFORMIN (GLUCOPHAGE) 500 MG tablet Take 1 tablet (500 mg total) by mouth 2 (two) times daily with a meal. 12/14/20   [name], Cari S, PA-C  oxyCODONE-acetaminophen (PERCOCET/ROXICET) 5-325 MG tablet Take 1 tablet by mouth every 4 (four) hours as needed for severe pain. 01/23/21   [name], MD  Potassium Chloride ER 20 MEQ TBCR Take 20 mEq by mouth daily. 01/30/21   Milford, Anderson Malta, FNP  sacubitril-valsartan (ENTRESTO) 97-103 MG Take 1 tablet by mouth 2 (two) times daily. 01/30/21   [name], FNP  spironolactone (ALDACTONE) 25 MG tablet Take 1 tablet (25 mg total) by mouth daily. 01/30/21   [name], FNP  torsemide (DEMADEX) 20 MG tablet Take 3 tablets (60 mg total) by  mouth daily. 01/30/21   [name], FNP  TRUEplus Lancets 28G MISC Use as directed 10/23/19   [name], MD  Vitamin D, Ergocalciferol, (DRISDOL) 1.25 MG (50000 UNIT) CAPS capsule Take 1 capsule (50,000 Units total) by mouth every 7 (seven) days. Patient not taking: Reported on 01/30/2021 12/16/20   Roney Jaffe, PA-C    Physical Exam: BP 107/73   Pulse 89   Temp 98.5 F (36.9 C) (Oral)   Resp (!) 22   Ht 6\' 3"  (1.905 m)   SpO2 100%   BMI 24.87 kg/m   . General: 65 y.o. year-old male well developed well nourished in no acute distress.  Alert and oriented x3. 68 HEENT: NCAT, EOMI . Neck: Supple, trachea medial . Cardiovascular: Regular rate and rhythm with no rubs or gallops.  No thyromegaly or JVD noted. 2/4 pulses in all 4 extremities. Marland Kitchen Respiratory: Clear to auscultation with no wheezes or rales. Good inspiratory effort. . Abdomen: Soft nontender nondistended with normal bowel sounds x4  quadrants. . Muskuloskeletal: Left second toe open wound with drainage. . Neuro: CN II-XII intact, sensation, reflexes intact . Skin: No ulcerative lesions noted or rashes . Psychiatry: Mood is appropriate for condition and setting          Labs on Admission:  Basic Metabolic Panel: Recent Labs  Lab 02/06/21 2057  NA 126*  K 5.0  CL 88*  CO2 27  GLUCOSE 237*  BUN 30*  CREATININE 1.54*  CALCIUM 9.1   Liver Function Tests: Recent Labs  Lab 02/06/21 2057  AST 17  ALT 13  ALKPHOS 98  BILITOT 1.7*  PROT 7.5  ALBUMIN 2.5*   No results for input(s): LIPASE, AMYLASE in the last 168 hours. No results for input(s): AMMONIA in the last 168 hours. CBC: Recent Labs  Lab 02/06/21 2057 02/07/21 0346  WBC 28.5* 25.2*  NEUTROABS 24.8*  --   HGB 11.2* 10.7*  HCT 34.5* 33.6*  MCV 81.4 84.2  PLT 563* 445*   Cardiac Enzymes: No results for input(s): CKTOTAL, CKMB, CKMBINDEX, TROPONINI in the last 168 hours.  BNP (last 3 results) Recent Labs    12/25/20 0019  BNP  2,579.0*    ProBNP (last 3 results) No results for input(s): PROBNP in the last 8760 hours.  CBG: No results for input(s): GLUCAP in the last 168 hours.  Radiological Exams on Admission: DG Foot Complete Left  Result Date: 02/06/2021 CLINICAL DATA:  Wound dehiscence EXAM: LEFT FOOT - COMPLETE 3+ VIEW COMPARISON:  None. FINDINGS: Prior 1st and 2nd transmetatarsal amputation. Gas noted throughout the soft tissues of the forefoot. No bone destruction to suggest osteomyelitis. No acute fracture, subluxation or dislocation. IMPRESSION: Gas throughout the soft tissues of the foot. No changes of acute osteomyelitis by plain film. Electronically Signed   By: Rolm Baptise M.D.   On: 02/06/2021 21:29    EKG: I independently viewed the EKG done and my findings are as followed: EKG was not done in the ED  Assessment/Plan Present on Admission: . Left foot infection . Essential hypertension . Hyperlipidemia  Principal Problem:   Left foot infection Active Problems:   Essential hypertension   Hyperlipidemia   Noncompliance   Leukocytosis   Thrombocytosis   Hyponatremia   AKI (acute kidney injury) ([place])   Hyperglycemia due to diabetes mellitus (HCC)   Hypoalbuminemia due to protein-calorie malnutrition (HCC)    Left foot infection status post amputation Patient was started on IV cefepime and IV vancomycin, we shall continue with same at this time Continue IV morphine 2 mg every 4 hours as needed for moderate/severe pain Continue Tylenol 650 mg p.o. every 6 hours as needed for fever/mild pain Continue wound care Continue PT/OT eval and treat Orthopedic surgeon will be consulted, shall await further recommendation  Leukocytosis possibly secondary to above WBC 28.5; continue treatment as per above  Noncompliance with treatment regimen Patient did not follow-up with postsurgical appointment Patient was counseled on importance of compliance with treatment regimen  Thrombocytosis  possibly reactive  Platelets 563, continue to monitor  Hyponatremia Na 126, IV hydration provided Continue IV hydration Continue to monitor sodium with serial BMPs Urine, serum osmolality and urine sodium will be checked  Acute kidney injury BUNs/creatinine 30/1.54(baseline creatinine at 1.1-1.3)  Renally adjust medications, avoid nephrotoxic agents/dehydration/hypotension  Hyperglycemia secondary to T2DM Continue ISS and hypoglycemia protocol  Hypoalbuminemia possibly due to moderate protein calorie malnutrition Protein supplement will be provided  Essential hypertension  BP meds will be held at this time due  to soft BP  Hyperlipidemia Continue Lipitor when patient resumes oral intake  HFrEF LVEF done on 12/26/2020 showed an LVEF of 25 to 30% with severely decreased LV function and LV global hypokinesis. Continue home meds when patient resumes oral intake  DVT prophylaxis: SCDs  Code Status: Full code  Family Communication: None at bedside  Disposition Plan:  Patient is from:                        home Anticipated DC to:                   SNF or family members home Anticipated DC date:               2-3 days Anticipated DC barriers:           Patient is unstable to be discharged at this time due to left wound infection requiring IV antibiotics and pending orthopedic consult   Consults called: Orthopedic surgery  Admission status: Inpatient    Bernadette Hoit MD [facility]  02/07/2021, 4:13 AM

## 2021-02-07 NOTE — Evaluation (Signed)
Occupational Therapy Evaluation Patient Details Name: Scott Greer MRN: [id number] DOB: [birth year] Today's Date: 02/07/2021    History of Present Illness 65 y.o. male with medical history significant for hypertension, hyperlipidemia, T2DM and recent left second toe amputation who presents to the emergency department due to left foot pain from the wound site.  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 Lajoyce Corners, he has missed 2 postoperative visits due to oversleeping and not being able to make it to the appointment.  He noted that the wound opened again and due to ongoing pain in the foot, he decided to go to the ED for further evaluation.   Clinical Impression   Pt admitted with the above diagnoses and presents with below problem list. Pt will benefit from continued acute OT to address the below listed deficits and maximize independence with basic ADLs prior to d/c to venue below. PTA, pt endorses struggling at home with functional mobility/transfers, LB ADLs , and IADLs. Pt currently needs up to min A with LB ADLs. Unable to assess standing this session d/t pain. Noted that pt is for surgery tomorrow. He will likely need ST SNF at d/c as he has no assistance at home.     Follow Up Recommendations  SNF;Supervision/Assistance - 24 hour    Equipment Recommendations  Other (comment) (TBD s/p surgery tomorrow, likely will need 3n1)    Recommendations for Other Services PT consult     Precautions / Restrictions Precautions Precautions: Fall Restrictions Weight Bearing Restrictions: Yes LUE Weight Bearing: Touch down weight bearing Other Position/Activity Restrictions: per Dr. Audrie Lia op note on 3/23:"TDWB on the left"      Mobility Bed Mobility Overal bed mobility: Needs Assistance Bed Mobility: Supine to Sit;Sit to Supine     Supine to sit: Min guard Sit to supine: Min guard   General bed mobility comments: min guard for safety and  managing environment around L foot (removing blankets)    Transfers                 General transfer comment: unable to assess d/t pain    Balance Overall balance assessment: Needs assistance Sitting-balance support: Single extremity supported;No upper extremity supported;Feet supported Sitting balance-Leahy Scale: Fair                                     ADL either performed or assessed with clinical judgement   ADL Overall ADL's : Needs assistance/impaired Eating/Feeding: Set up;Sitting;Bed level   Grooming: Set up;Sitting;Bed level   Upper Body Bathing: Set up;Sitting;Bed level   Lower Body Bathing: Min guard;Sitting/lateral leans;Minimal assistance   Upper Body Dressing : Set up;Sitting;Bed level   Lower Body Dressing: Minimal assistance;Sitting/lateral leans                 General ADL Comments: Pt limited by L foot pain this session which worsened once EOB. Sat EOB about 20 seconds then initiated return to supine.     Vision         Perception     Praxis      Pertinent Vitals/Pain Pain Assessment: Faces Faces Pain Scale: Hurts even more Pain Location: L foot Pain Descriptors / Indicators: Grimacing;Guarding Pain Intervention(s): Patient requesting pain meds-RN notified;Limited activity within patient's tolerance;Monitored during session;Repositioned     Hand Dominance     Extremity/Trunk Assessment Upper Extremity Assessment Upper  Extremity Assessment: Generalized weakness;Overall [facility] for tasks assessed   Lower Extremity Assessment Lower Extremity Assessment: Defer to PT evaluation       Communication Communication Communication: No difficulties   Cognition Arousal/Alertness: Awake/alert Behavior During Therapy: Flat affect;Anxious;WFL for tasks assessed/performed Overall Cognitive Status: No family/caregiver present to determine baseline cognitive functioning                                 General  Comments: some problem solving and STM deficits noted. Tangential responses. Verbal perseveration. Endores feeling overwhelmed with "everything." Pt concerned about housing as rent was increased to more than he can afford.   General Comments       Exercises     Shoulder Instructions      Home Living Family/patient expects to be discharged to:: Private residence Living Arrangements: Alone Available Help at Discharge: Other (Comment) (reports no help at home) Type of Home: Apartment Home Access: Other (comment)     Home Layout: One level     Bathroom Shower/Tub: Tub/shower unit         Home Equipment: Cane - single point;Crutches          Prior Functioning/Environment Level of Independence: Independent;Independent with assistive device(s)        Comments: endorses struggling with managing IADLs, mobility, toilet trasnfers, and LB ADLs. "I'm in the bed most of the day."        OT Problem List: Decreased activity tolerance;Decreased strength;Impaired balance (sitting and/or standing);Decreased safety awareness;Decreased knowledge of use of DME or AE;Decreased knowledge of precautions;Pain      OT Treatment/Interventions: Self-care/ADL training;Therapeutic exercise;Energy conservation;DME and/or AE instruction;Therapeutic activities;Cognitive remediation/compensation;Patient/family education;Balance training    OT Goals(Current goals can be found in the care plan section) Acute Rehab OT Goals Patient Stated Goal: reduce pain, regain PLOF OT Goal Formulation: With patient Time For Goal Achievement: 02/21/21 Potential to Achieve Goals: Good ADL Goals Pt Will Perform Lower Body Bathing: with modified independence;sit to/from stand Pt Will Perform Lower Body Dressing: with modified independence;sit to/from stand Pt Will Transfer to Toilet: with modified independence;ambulating Pt Will Perform Toileting - Clothing Manipulation and hygiene: with modified independence;sit  to/from stand Additional ADL Goal #1: Pt will complete bed mobility at mod I level to prepare for EOB/OOB ADLs.  OT Frequency: Min 2X/week   Barriers to D/C: Decreased caregiver support  Pt reports rent has increased on his apartment and he can no longer afford to live there.       Co-evaluation              AM-PAC OT "6 Clicks" Daily Activity     Outcome Measure Help from another person eating meals?: None Help from another person taking care of personal grooming?: None Help from another person toileting, which includes using toliet, bedpan, or urinal?: A Little Help from another person bathing (including washing, rinsing, drying)?: A Little Help from another person to put on and taking off regular upper body clothing?: A Little Help from another person to put on and taking off regular lower body clothing?: A Little 6 Click Score: 20   End of Session Nurse Communication: Patient requests pain meds  Activity Tolerance: Patient limited by pain Patient left: in bed;with call bell/phone within reach;with bed alarm set;with nursing/sitter in room;Other (comment) (RN giving pain med)  OT Visit Diagnosis: Unsteadiness on feet (R26.81);Pain;Muscle weakness (generalized) (M62.81) Pain - Right/Left: Left Pain - part of body:  Ankle and joints of foot                Time: 1248-1310 OT Time Calculation (min): 22 min Charges:  OT General Charges $OT Visit: 1 Visit OT Evaluation $OT Eval Low Complexity: 1 Low  Raynald Kemp, OT Acute Rehabilitation Services Pager: [phone] Office: [phone]   Pilar Grammes 02/07/2021, 2:25 PM

## 2021-02-07 NOTE — H&P (View-Only) (Signed)
  ORTHOPAEDIC CONSULTATION  REQUESTING PHYSICIAN: Joseph, Preetha, MD  Chief Complaint: Necrotic ulcer with purulent drainage left foot.  HPI: Scott Greer is a 64 y.o. male who presents with purulent abscess with wound dehiscence of the first and second ray amputations left foot.  Patient states that he missed his follow-up appointments due to sleeping and he said he slept until 3 and 5:00 in the afternoon.  Patient states he is been walking on his heel and has foul-smelling drainage.  Past Medical History:  Diagnosis Date  . Dehiscence of amputation stump (HCC)    left great toe  . Diabetes mellitus without complication (HCC) 1999   Type II  . Glaucoma 2015  . Hyperlipidemia   . Hypertension 2005  . Left foot infection 02/07/2021  . Osteomyelitis (HCC)   . Wears glasses    Past Surgical History:  Procedure Laterality Date  . AMPUTATION Left 06/05/2019   Procedure: LEFT GREAT TOE AMPUTATION;  Surgeon: Damyen Knoll V, MD;  Location: [facility] OR;  Service: Orthopedics;  Laterality: Left;  . AMPUTATION Left 07/10/2019   Procedure: LEFT FOOT 1ST RAY AMPUTATION;  Surgeon: Regan Llorente V, MD;  Location: [facility] OR;  Service: Orthopedics;  Laterality: Left;  . AMPUTATION Right 05/25/2020   Procedure: RIGHT GREAT TOE AMPUTATION;  Surgeon: Exie Chrismer V, MD;  Location: [facility] OR;  Service: Orthopedics;  Laterality: Right;  . AMPUTATION Left 01/25/2021   Procedure: LEFT 2ND TOE AMPUTATION;  Surgeon: Karin Pinedo V, MD;  Location: [facility] OR;  Service: Orthopedics;  Laterality: Left;  . NO PAST SURGERIES    . RIGHT/LEFT HEART CATH AND CORONARY ANGIOGRAPHY N/A 01/02/2021   Procedure: RIGHT/LEFT HEART CATH AND CORONARY ANGIOGRAPHY;  Surgeon: Berry, Jonathan J, MD;  Location: [facility];  Service: Cardiovascular;  Laterality: N/A;   Social History   Socioeconomic History  . Marital status: Legally Separated    Spouse name: Tawana  . Number of children: 2  . Years of education: Not on file  .  Highest education level: Associate degree: occupational, technical, or vocational program  Occupational History  . Not on file  Tobacco Use  . Smoking status: Current Every Day Smoker    Types: Cigars, Cigarettes  . Smokeless tobacco: Former User    Types: Chew  . Tobacco comment: 6 daily  Vaping Use  . Vaping Use: Never used  Substance and Sexual Activity  . Alcohol use: Yes    Alcohol/week: 11.0 standard drinks    Types: 3 Cans of beer, 8 Shots of liquor per week    Comment: occasional  . Drug use: Yes    Types: Marijuana    Comment: ocassional - last time 05/08/2020  . Sexual activity: Not Currently  Other Topics Concern  . Not on file  Social History Narrative   Out of prison for 2 months.   Lives at home with his wife.   Social Determinants of Health   Financial Resource Strain: High Risk  . Difficulty of Paying Living Expenses: Very hard  Food Insecurity: Food Insecurity Present  . Worried About Running Out of Food in the Last Year: Sometimes true  . Ran Out of Food in the Last Year: Sometimes true  Transportation Needs: Unmet Transportation Needs  . Lack of Transportation (Medical): Yes  . Lack of Transportation (Non-Medical): Yes  Physical Activity: Inactive  . Days of Exercise per Week: 0 days  . Minutes of Exercise per Session: 0 min  Stress: Not on file    Social Connections: Not on file   Family History  Problem Relation Age of Onset  . Stroke Mother   . Diabetes Mother        Toward end of life   - negative except otherwise stated in the family history section Allergies  Allergen Reactions  . Bee Venom Hives, Itching and Swelling   Prior to Admission medications   Medication Sig Start Date End Date Taking? Authorizing Provider  lisinopril (ZESTRIL) 10 MG tablet Take 10 mg by mouth daily.   Yes [provider]  amiodarone (PACERONE) 200 MG tablet Take 1 tablet (200 mg total) by mouth daily. 01/30/21   Milford, Jessica M, FNP  aspirin 81 MG  chewable tablet Chew 1 tablet (81 mg total) by mouth daily. 01/30/21   Milford, Jessica M, FNP  atorvastatin (LIPITOR) 10 MG tablet Take 1 tablet (10 mg total) by mouth daily. 01/30/21   Milford, Jessica M, FNP  Blood Glucose Monitoring Suppl (TRUE METRIX METER) w/Device KIT Use as directed 10/23/19   Johnson, Deborah B, MD  digoxin (LANOXIN) 0.125 MG tablet Take 1 tablet (0.125 mg total) by mouth daily. 01/30/21   Milford, Jessica M, FNP  doxycycline (VIBRA-TABS) 100 MG tablet Take 1 tablet (100 mg total) by mouth 2 (two) times daily. 01/25/21   Persons, Mary Anne, PA  empagliflozin (JARDIANCE) 10 MG TABS tablet Take 1 tablet (10 mg total) by mouth daily. 01/30/21   Milford, Jessica M, FNP  glucose blood (TRUE METRIX BLOOD GLUCOSE TEST) test strip Use as instructed 10/23/19   Johnson, Deborah B, MD  metFORMIN (GLUCOPHAGE) 500 MG tablet Take 1 tablet (500 mg total) by mouth 2 (two) times daily with a meal. 12/14/20   [name], Cari S, PA-C  oxyCODONE-acetaminophen (PERCOCET/ROXICET) 5-325 MG tablet Take 1 tablet by mouth every 4 (four) hours as needed for severe pain. 01/23/21   Allah Reason V, MD  Potassium Chloride ER 20 MEQ TBCR Take 20 mEq by mouth daily. 01/30/21   Milford, Jessica M, FNP  sacubitril-valsartan (ENTRESTO) 97-103 MG Take 1 tablet by mouth 2 (two) times daily. 01/30/21   Milford, Jessica M, FNP  spironolactone (ALDACTONE) 25 MG tablet Take 1 tablet (25 mg total) by mouth daily. 01/30/21   Milford, Jessica M, FNP  torsemide (DEMADEX) 20 MG tablet Take 3 tablets (60 mg total) by mouth daily. 01/30/21   Milford, Jessica M, FNP  TRUEplus Lancets 28G MISC Use as directed 10/23/19   Johnson, Deborah B, MD  Vitamin D, Ergocalciferol, (DRISDOL) 1.25 MG (50000 UNIT) CAPS capsule Take 1 capsule (50,000 Units total) by mouth every 7 (seven) days. Patient not taking: Reported on 01/30/2021 12/16/20   [name], Cari S, PA-C   DG Foot Complete Left  Result Date: 02/06/2021 CLINICAL DATA:  Wound dehiscence EXAM:  LEFT FOOT - COMPLETE 3+ VIEW COMPARISON:  None. FINDINGS: Prior 1st and 2nd transmetatarsal amputation. Gas noted throughout the soft tissues of the forefoot. No bone destruction to suggest osteomyelitis. No acute fracture, subluxation or dislocation. IMPRESSION: Gas throughout the soft tissues of the foot. No changes of acute osteomyelitis by plain film. Electronically Signed   By: Kevin  Dover M.D.   On: 02/06/2021 21:29   - pertinent xrays, CT, MRI studies were reviewed and independently interpreted  Positive ROS: All other systems have been reviewed and were otherwise negative with the exception of those mentioned in the HPI and as above.  Physical Exam: General: Alert, no acute distress Psychiatric: Patient is competent for consent with   normal mood and affect Lymphatic: No axillary or cervical lymphadenopathy Cardiovascular: No pedal edema Respiratory: No cyanosis, no use of accessory musculature GI: No organomegaly, abdomen is soft and non-tender    Images:  @ENCIMAGES@  Labs:  Lab Results  Component Value Date   HGBA1C 8.6 (H) 02/07/2021   HGBA1C 8.4 (A) 12/14/2020   HGBA1C 7.1 (A) 06/20/2020   ESRSEDRATE 78 (H) 06/04/2019   CRP 4.5 (H) 06/04/2019   REPTSTATUS PENDING 02/06/2021   REPTSTATUS PENDING 02/06/2021   CULT  02/06/2021    NO GROWTH < 12 HOURS Performed at [facility], [address] [place], [place] [address]    CULT  02/06/2021    NO GROWTH < 12 HOURS Performed at [facility], [address] [place], [place] [address]     Lab Results  Component Value Date   ALBUMIN 2.3 (L) 02/07/2021   ALBUMIN 2.5 (L) 02/06/2021   ALBUMIN 3.1 (L) 12/25/2020   PREALBUMIN 13.8 (L) 06/04/2019    No results found for: CBC  Neurologic: Patient does not have protective sensation bilateral lower extremities.   MUSCULOSKELETAL:   Skin: Examination there is wound dehiscence with necrotic edges from the first and second ray amputation.  The third toe is  black and necrotic.  There is purulent drainage from the wound.  Patient's foot is globally tender to palpation.  I cannot palpate pulses the Doppler was used and patient does have a strong biphasic dorsalis pedis and posterior tibial pulse there is also a strong anterior tibial pulse.  Hemoglobin A1c 8.6.  Albumin 2.3.  Hemoglobin 10.7 and white cell count 25.2  Assessment: Assessment: Peripheral vascular disease with wound dehiscence of the first and second ray amputation with purulent drainage and dry gangrenous changes to the third toe.  Plan: I have recommended patient proceed with a transtibial amputation.  Patient states he could not consider a transtibial amputation at this time.  Will proceed with a transmetatarsal amputation.  Discussed that this has about a 25% chance of healing.  Discussed that if this does not heal we would need to proceed with a transtibial amputation.  Patient states he understands and wishes to proceed with foot salvage surgery.  We will plan for a transmetatarsal amputation tomorrow.  Thank you for the consult and the opportunity to see Scott Greer  Adleigh Mcmasters, MD [facility] [phone] 12:34 PM     

## 2021-02-08 ENCOUNTER — Inpatient Hospital Stay (HOSPITAL_COMMUNITY): Payer: Medicaid Other | Admitting: Certified Registered"

## 2021-02-08 ENCOUNTER — Other Ambulatory Visit: Payer: Self-pay | Admitting: Physician Assistant

## 2021-02-08 ENCOUNTER — Encounter (HOSPITAL_COMMUNITY)
Admission: EM | Disposition: A | Discharge status: Home Health | Payer: Self-pay | Source: Home / Self Care | Attending: Internal Medicine

## 2021-02-08 ENCOUNTER — Encounter (HOSPITAL_COMMUNITY): Payer: Self-pay | Admitting: Internal Medicine

## 2021-02-08 DIAGNOSIS — E1169 Type 2 diabetes mellitus with other specified complication: Secondary | ICD-10-CM | POA: Diagnosis not present

## 2021-02-08 DIAGNOSIS — I428 Other cardiomyopathies: Secondary | ICD-10-CM | POA: Diagnosis not present

## 2021-02-08 DIAGNOSIS — L02612 Cutaneous abscess of left foot: Secondary | ICD-10-CM

## 2021-02-08 DIAGNOSIS — G8918 Other acute postprocedural pain: Secondary | ICD-10-CM | POA: Diagnosis not present

## 2021-02-08 DIAGNOSIS — E871 Hypo-osmolality and hyponatremia: Secondary | ICD-10-CM | POA: Diagnosis not present

## 2021-02-08 DIAGNOSIS — I130 Hypertensive heart and chronic kidney disease with heart failure and stage 1 through stage 4 chronic kidney disease, or unspecified chronic kidney disease: Secondary | ICD-10-CM | POA: Diagnosis not present

## 2021-02-08 DIAGNOSIS — T8131XA Disruption of external operation (surgical) wound, not elsewhere classified, initial encounter: Secondary | ICD-10-CM | POA: Diagnosis not present

## 2021-02-08 DIAGNOSIS — N179 Acute kidney failure, unspecified: Secondary | ICD-10-CM | POA: Diagnosis not present

## 2021-02-08 DIAGNOSIS — M869 Osteomyelitis, unspecified: Secondary | ICD-10-CM | POA: Diagnosis not present

## 2021-02-08 DIAGNOSIS — T8130XA Disruption of wound, unspecified, initial encounter: Secondary | ICD-10-CM | POA: Diagnosis not present

## 2021-02-08 DIAGNOSIS — D72829 Elevated white blood cell count, unspecified: Secondary | ICD-10-CM | POA: Diagnosis not present

## 2021-02-08 DIAGNOSIS — I5042 Chronic combined systolic (congestive) and diastolic (congestive) heart failure: Secondary | ICD-10-CM | POA: Diagnosis not present

## 2021-02-08 DIAGNOSIS — E785 Hyperlipidemia, unspecified: Secondary | ICD-10-CM | POA: Diagnosis not present

## 2021-02-08 DIAGNOSIS — E1152 Type 2 diabetes mellitus with diabetic peripheral angiopathy with gangrene: Secondary | ICD-10-CM | POA: Diagnosis not present

## 2021-02-08 DIAGNOSIS — I96 Gangrene, not elsewhere classified: Secondary | ICD-10-CM

## 2021-02-08 DIAGNOSIS — L089 Local infection of the skin and subcutaneous tissue, unspecified: Secondary | ICD-10-CM | POA: Diagnosis not present

## 2021-02-08 DIAGNOSIS — E875 Hyperkalemia: Secondary | ICD-10-CM | POA: Diagnosis not present

## 2021-02-08 DIAGNOSIS — E440 Moderate protein-calorie malnutrition: Secondary | ICD-10-CM | POA: Diagnosis not present

## 2021-02-08 DIAGNOSIS — I10 Essential (primary) hypertension: Secondary | ICD-10-CM | POA: Diagnosis not present

## 2021-02-08 DIAGNOSIS — Z20822 Contact with and (suspected) exposure to covid-19: Secondary | ICD-10-CM | POA: Diagnosis not present

## 2021-02-08 DIAGNOSIS — E1165 Type 2 diabetes mellitus with hyperglycemia: Secondary | ICD-10-CM | POA: Diagnosis not present

## 2021-02-08 HISTORY — PX: AMPUTATION: SHX166

## 2021-02-08 LAB — GLUCOSE, CAPILLARY
Glucose-Capillary: 114 mg/dL — ABNORMAL HIGH (ref 70–99)
Glucose-Capillary: 126 mg/dL — ABNORMAL HIGH (ref 70–99)
Glucose-Capillary: 128 mg/dL — ABNORMAL HIGH (ref 70–99)
Glucose-Capillary: 128 mg/dL — ABNORMAL HIGH (ref 70–99)
Glucose-Capillary: 144 mg/dL — ABNORMAL HIGH (ref 70–99)
Glucose-Capillary: 169 mg/dL — ABNORMAL HIGH (ref 70–99)
Glucose-Capillary: 207 mg/dL — ABNORMAL HIGH (ref 70–99)

## 2021-02-08 LAB — CBC
HCT: 29.8 % — ABNORMAL LOW (ref 39.0–52.0)
Hemoglobin: 9.8 g/dL — ABNORMAL LOW (ref 13.0–17.0)
MCH: 26.3 pg (ref 26.0–34.0)
MCHC: 32.9 g/dL (ref 30.0–36.0)
MCV: 79.9 fL — ABNORMAL LOW (ref 80.0–100.0)
Platelets: 464 10*3/uL — ABNORMAL HIGH (ref 150–400)
RBC: 3.73 MIL/uL — ABNORMAL LOW (ref 4.22–5.81)
RDW: 15.9 % — ABNORMAL HIGH (ref 11.5–15.5)
WBC: 16.1 10*3/uL — ABNORMAL HIGH (ref 4.0–10.5)
nRBC: 0 % (ref 0.0–0.2)

## 2021-02-08 LAB — BASIC METABOLIC PANEL
Anion gap: 11 (ref 5–15)
BUN: 37 mg/dL — ABNORMAL HIGH (ref 8–23)
CO2: 25 mmol/L (ref 22–32)
Calcium: 8.8 mg/dL — ABNORMAL LOW (ref 8.9–10.3)
Chloride: 94 mmol/L — ABNORMAL LOW (ref 98–111)
Creatinine, Ser: 1.27 mg/dL — ABNORMAL HIGH (ref 0.61–1.24)
GFR, Estimated: >60 mL/min (ref >60)
Glucose, Bld: 145 mg/dL — ABNORMAL HIGH (ref 70–99)
Potassium: 4.5 mmol/L (ref 3.5–5.1)
Sodium: 130 mmol/L — ABNORMAL LOW (ref 135–145)

## 2021-02-08 LAB — HEMOGLOBIN A1C
Hgb A1c MFr Bld: 9.1 % — ABNORMAL HIGH (ref 4.8–5.6)
Mean Plasma Glucose: 214.47 mg/dL

## 2021-02-08 SURGERY — AMPUTATION, FOOT, PARTIAL
Anesthesia: Regional | Site: Foot | Laterality: Left

## 2021-02-08 MED ORDER — ALUM & MAG HYDROXIDE-SIMETH 200-200-20 MG/5ML PO SUSP
15.0000 mL | ORAL | Status: DC | PRN
Start: 2021-02-08 — End: 2021-02-15

## 2021-02-08 MED ORDER — GUAIFENESIN-DM 100-10 MG/5ML PO SYRP: 15.0000 mL | ORAL_SOLUTION | ORAL | Status: DC | PRN

## 2021-02-08 MED ORDER — OXYCODONE HCL 5 MG PO TABS
5.0000 mg | ORAL_TABLET | ORAL | Status: DC | PRN
  Administered 2021-02-09 – 2021-02-12 (×8): 10 mg via ORAL
  Administered 2021-02-13 (×2): 5 mg via ORAL
  Administered 2021-02-13 – 2021-02-15 (×5): 10 mg via ORAL
  Filled 2021-02-08 (×14): qty 2
  Filled 2021-02-08: qty 1

## 2021-02-08 MED ORDER — PHENOL 1.4 % MT LIQD: 1.0000 | OROMUCOSAL | Status: DC | PRN

## 2021-02-08 MED ORDER — VANCOMYCIN HCL 1000 MG/200ML IV SOLN
1000.0000 mg | Freq: Two times a day (BID) | INTRAVENOUS | Status: DC
  Administered 2021-02-09 – 2021-02-12 (×7): 1000 mg via INTRAVENOUS
  Filled 2021-02-08 (×8): qty 200

## 2021-02-08 MED ORDER — ORAL CARE MOUTH RINSE: 15.0000 mL | Freq: Once | OROMUCOSAL | Status: AC

## 2021-02-08 MED ORDER — PROPOFOL 10 MG/ML IV BOLUS
INTRAVENOUS | Status: DC | PRN
  Administered 2021-02-08: 100 mg via INTRAVENOUS

## 2021-02-08 MED ORDER — ONDANSETRON HCL 4 MG/2ML IJ SOLN: 4.0000 mg | Freq: Once | INTRAMUSCULAR | Status: DC | PRN

## 2021-02-08 MED ORDER — PHENYLEPHRINE 40 MCG/ML (10ML) SYRINGE FOR IV PUSH (FOR BLOOD PRESSURE SUPPORT)
PREFILLED_SYRINGE | INTRAVENOUS | Status: DC | PRN
  Administered 2021-02-08 (×2): 80 ug via INTRAVENOUS

## 2021-02-08 MED ORDER — MIDAZOLAM HCL 2 MG/2ML IJ SOLN
INTRAMUSCULAR | Status: DC | PRN
  Administered 2021-02-08: 2 mg via INTRAVENOUS

## 2021-02-08 MED ORDER — MIDAZOLAM HCL 2 MG/2ML IJ SOLN
INTRAMUSCULAR | Status: AC
  Filled 2021-02-08: qty 2

## 2021-02-08 MED ORDER — POTASSIUM CHLORIDE IN NACL 20-0.9 MEQ/L-% IV SOLN
INTRAVENOUS | Status: DC
  Filled 2021-02-08 (×2): qty 1000

## 2021-02-08 MED ORDER — ROPIVACAINE HCL 5 MG/ML IJ SOLN
INTRAMUSCULAR | Status: DC | PRN
  Administered 2021-02-08: 30 mL via PERINEURAL

## 2021-02-08 MED ORDER — DOCUSATE SODIUM 100 MG PO CAPS
100.0000 mg | ORAL_CAPSULE | Freq: Every day | ORAL | Status: DC
  Administered 2021-02-09 – 2021-02-15 (×7): 100 mg via ORAL
  Filled 2021-02-08 (×7): qty 1

## 2021-02-08 MED ORDER — INSULIN ASPART 100 UNIT/ML ~~LOC~~ SOLN
0.0000 [IU] | Freq: Three times a day (TID) | SUBCUTANEOUS | Status: DC
  Administered 2021-02-09 – 2021-02-10 (×4): 3 [IU] via SUBCUTANEOUS
  Administered 2021-02-11: 8 [IU] via SUBCUTANEOUS
  Administered 2021-02-12 (×2): 3 [IU] via SUBCUTANEOUS
  Administered 2021-02-12: 2 [IU] via SUBCUTANEOUS
  Administered 2021-02-13 (×2): 3 [IU] via SUBCUTANEOUS
  Administered 2021-02-13: 2 [IU] via SUBCUTANEOUS
  Administered 2021-02-14 – 2021-02-15 (×5): 3 [IU] via SUBCUTANEOUS

## 2021-02-08 MED ORDER — ROPIVACAINE HCL 7.5 MG/ML IJ SOLN
INTRAMUSCULAR | Status: DC | PRN
  Administered 2021-02-08: 20 mL via PERINEURAL

## 2021-02-08 MED ORDER — ONDANSETRON HCL 4 MG/2ML IJ SOLN
INTRAMUSCULAR | Status: DC | PRN
  Administered 2021-02-08: 4 mg via INTRAVENOUS

## 2021-02-08 MED ORDER — INSULIN ASPART 100 UNIT/ML ~~LOC~~ SOLN
0.0000 [IU] | Freq: Every day | SUBCUTANEOUS | Status: DC
  Administered 2021-02-08: 2 [IU] via SUBCUTANEOUS
  Administered 2021-02-10: 4 [IU] via SUBCUTANEOUS
  Administered 2021-02-13 – 2021-02-14 (×2): 2 [IU] via SUBCUTANEOUS

## 2021-02-08 MED ORDER — FENTANYL CITRATE (PF) 250 MCG/5ML IJ SOLN
INTRAMUSCULAR | Status: DC | PRN
  Administered 2021-02-08: 100 ug via INTRAVENOUS

## 2021-02-08 MED ORDER — LACTATED RINGERS IV SOLN: INTRAVENOUS | Status: DC

## 2021-02-08 MED ORDER — CHLORHEXIDINE GLUCONATE 0.12 % MT SOLN
OROMUCOSAL | Status: AC
  Administered 2021-02-08: 15 mL via OROMUCOSAL
  Filled 2021-02-08: qty 15

## 2021-02-08 MED ORDER — LIDOCAINE 2% (20 MG/ML) 5 ML SYRINGE
INTRAMUSCULAR | Status: DC | PRN
  Administered 2021-02-08: 60 mg via INTRAVENOUS

## 2021-02-08 MED ORDER — FENTANYL CITRATE (PF) 100 MCG/2ML IJ SOLN: 25.0000 ug | INTRAMUSCULAR | Status: DC | PRN

## 2021-02-08 MED ORDER — CHLORHEXIDINE GLUCONATE 0.12 % MT SOLN: 15.0000 mL | Freq: Once | OROMUCOSAL | Status: AC

## 2021-02-08 MED ORDER — FENTANYL CITRATE (PF) 100 MCG/2ML IJ SOLN
INTRAMUSCULAR | Status: AC
  Filled 2021-02-08: qty 2

## 2021-02-08 MED ORDER — FENTANYL CITRATE (PF) 250 MCG/5ML IJ SOLN
INTRAMUSCULAR | Status: AC
  Filled 2021-02-08: qty 5

## 2021-02-08 SURGICAL SUPPLY — 33 items
APL SKNCLS STERI-STRIP NONHPOA (GAUZE/BANDAGES/DRESSINGS) ×1
BENZOIN TINCTURE PRP APPL 2/3 (GAUZE/BANDAGES/DRESSINGS) ×2 IMPLANT
BLADE SAW SGTL HD 18.5X60.5X1. (BLADE) ×2 IMPLANT
BLADE SURG 21 STRL SS (BLADE) ×2 IMPLANT
BNDG COHESIVE 4X5 TAN STRL (GAUZE/BANDAGES/DRESSINGS) IMPLANT
BNDG GAUZE ELAST 4 BULKY (GAUZE/BANDAGES/DRESSINGS) IMPLANT
COVER SURGICAL LIGHT HANDLE (MISCELLANEOUS) ×2 IMPLANT
COVER WAND RF STERILE (DRAPES) ×2 IMPLANT
DRAPE INCISE IOBAN 66X45 STRL (DRAPES) ×2 IMPLANT
DRAPE U-SHAPE 47X51 STRL (DRAPES) ×2 IMPLANT
DRSG ADAPTIC 3X8 NADH LF (GAUZE/BANDAGES/DRESSINGS) IMPLANT
DRSG PAD ABDOMINAL 8X10 ST (GAUZE/BANDAGES/DRESSINGS) IMPLANT
DURAPREP 26ML APPLICATOR (WOUND CARE) ×2 IMPLANT
ELECT REM PT RETURN 9FT ADLT (ELECTROSURGICAL) ×2
ELECTRODE REM PT RTRN 9FT ADLT (ELECTROSURGICAL) ×1 IMPLANT
GAUZE SPONGE 4X4 12PLY STRL (GAUZE/BANDAGES/DRESSINGS) IMPLANT
GLOVE BIOGEL PI IND STRL 9 (GLOVE) ×1 IMPLANT
GLOVE BIOGEL PI INDICATOR 9 (GLOVE) ×1
GLOVE SURG ORTHO 9.0 STRL STRW (GLOVE) ×2 IMPLANT
GOWN STRL REUS W/ TWL XL LVL3 (GOWN DISPOSABLE) ×3 IMPLANT
GOWN STRL REUS W/TWL XL LVL3 (GOWN DISPOSABLE) ×6
KIT BASIN OR (CUSTOM PROCEDURE TRAY) ×2 IMPLANT
KIT TURNOVER KIT B (KITS) ×2 IMPLANT
NS IRRIG 1000ML POUR BTL (IV SOLUTION) ×2 IMPLANT
PACK ORTHO EXTREMITY (CUSTOM PROCEDURE TRAY) ×2 IMPLANT
PAD ARMBOARD 7.5X6 YLW CONV (MISCELLANEOUS) ×4 IMPLANT
SPONGE LAP 18X18 RF (DISPOSABLE) IMPLANT
SUT ETHILON 2 0 PSLX (SUTURE) ×4 IMPLANT
TOWEL GREEN STERILE (TOWEL DISPOSABLE) ×2 IMPLANT
TOWEL GREEN STERILE FF (TOWEL DISPOSABLE) ×2 IMPLANT
TUBE CONNECTING 12X1/4 (SUCTIONS) ×2 IMPLANT
WATER STERILE IRR 1000ML POUR (IV SOLUTION) ×2 IMPLANT
YANKAUER SUCT BULB TIP NO VENT (SUCTIONS) ×2 IMPLANT

## 2021-02-08 NOTE — Progress Notes (Signed)
Pharmacy Antibiotic Note  Scott Greer is a 65 y.o. male admitted on 02/06/2021 with L foot infection s/p 2nd toe amputation on 01/25/2021.  Pharmacy has been consulted for vancomycin and cefepime dosing.   Renal function improving, afebrile, WBC down to 16.1.  Plan: Change vanc to 1gm IV Q12H for AUC 481 using SCr 1.27 Cefepime 2gm IV Q8H Monitor renal fxn, clinical progress, vanc levels as indicated Amputation today  Height: 6\' 3"  (190.5 cm) Weight: 90.3 kg (199 lb 1.2 oz) IBW/kg (Calculated) : 84.5  Temp (24hrs), Avg:98.3 F (36.8 C), Min:97.8 F (36.6 C), Max:98.6 F (37 C)  Recent Labs  Lab 02/06/21 2057 02/06/21 2245 02/07/21 0346 02/08/21 0429 02/08/21 0715  WBC 28.5*  --  25.2* 16.1*  --   CREATININE 1.54*  --  1.45*  --  1.27*  LATICACIDVEN 1.9 1.6  --   --   --     Estimated Creatinine Clearance: 72.1 mL/min (A) (by C-G formula based on SCr of 1.27 mg/dL (H)).    Allergies  Allergen Reactions  . Bee Venom Hives, Itching and Swelling    Vanc 4/5 >>  Cefepime 4/4 >>   4/4 BCx - NGTD 4/5 surgical PCR - negative  Scott Greer D. 12-12-1980, PharmD, BCPS, BCCCP 02/08/2021, 8:45 AM

## 2021-02-08 NOTE — Transfer of Care (Signed)
Immediate Anesthesia Transfer of Care Note  Patient: Scott Greer  Procedure(s) Performed: LEFT TRANSMETATARSAL AMPUTATION (Left Foot)  Patient Location: PACU  Anesthesia Type:GA combined with regional for post-op pain  Level of Consciousness: drowsy, patient cooperative and responds to stimulation  Airway & Oxygen Therapy: Patient Spontanous Breathing  Post-op Assessment: Report given to RN and Post -op Vital signs reviewed and stable  Post vital signs: Reviewed and stable  Last Vitals:  Vitals Value Taken Time  BP 93/63 02/08/21 1516  Temp 36.4 C 02/08/21 1516  Pulse 70 02/08/21 1516  Resp 14 02/08/21 1516  SpO2 98 % 02/08/21 1516  Vitals shown include unvalidated device data.  Last Pain:  Vitals:   02/08/21 0727  TempSrc: Oral  PainSc:          Complications: No complications documented.

## 2021-02-08 NOTE — Anesthesia Procedure Notes (Signed)
Procedure Name: LMA Insertion Date/Time: 02/08/2021 2:56 PM Performed by: Aundria Rud, CRNA Pre-anesthesia Checklist: Patient identified, Emergency Drugs available, Suction available and Patient being monitored Patient Re-evaluated:Patient Re-evaluated prior to induction Oxygen Delivery Method: Circle System Utilized Preoxygenation: Pre-oxygenation with 100% oxygen Induction Type: IV induction Ventilation: Mask ventilation without difficulty LMA: LMA inserted LMA Size: 5.0 Number of attempts: 1 Airway Equipment and Method: Bite block Placement Confirmation: positive ETCO2 Tube secured with: Tape Dental Injury: Teeth and Oropharynx as per pre-operative assessment

## 2021-02-08 NOTE — Progress Notes (Signed)
PT Cancellation Note  Patient Details Name: Scott Greer MRN: [id number] DOB: [birth year]   Cancelled Treatment:    Reason Eval/Treat Not Completed: Other (comment) Pt scheduled to go to OR for transmet amputation. Will follow up after surgery as schedule allows.   Farley Ly, PT, DPT  Acute Rehabilitation Services  Pager: [phone] Office: [phone]    Lehman Prom 02/08/2021, 10:30 AM

## 2021-02-08 NOTE — Anesthesia Postprocedure Evaluation (Signed)
Anesthesia Post Note  Patient: Scott Greer  Procedure(s) Performed: LEFT TRANSMETATARSAL AMPUTATION (Left Foot)     Patient location during evaluation: PACU Anesthesia Type: General Level of consciousness: awake and alert and oriented Pain management: pain level controlled Vital Signs Assessment: post-procedure vital signs reviewed and stable Respiratory status: spontaneous breathing, nonlabored ventilation and respiratory function stable Cardiovascular status: blood pressure returned to baseline and stable Postop Assessment: no apparent nausea or vomiting Anesthetic complications: no   No complications documented.  Last Vitals:  Vitals:   02/08/21 1531 02/08/21 1546  BP: (!) 94/58 99/66  Pulse: 75 82  Resp: 10 13  Temp:    SpO2: 91% 99%    Last Pain:  Vitals:   02/08/21 1546  TempSrc:   PainSc: 0-No pain                 [name]

## 2021-02-08 NOTE — Op Note (Signed)
02/08/2021  3:27 PM  PATIENT:  Scott Greer    PRE-OPERATIVE DIAGNOSIS:  Abscess Left Foot  POST-OPERATIVE DIAGNOSIS:  Same  PROCEDURE:  LEFT TRANSMETATARSAL AMPUTATION  SURGEON:  [name], MD  PHYSICIAN ASSISTANT:None ANESTHESIA:   General  PREOPERATIVE INDICATIONS:  Scott Greer is a  65 y.o. male with a diagnosis of Abscess Left Foot who failed conservative measures and elected for surgical management.    The risks benefits and alternatives were discussed with the patient preoperatively including but not limited to the risks of infection, bleeding, nerve injury, cardiopulmonary complications, the need for revision surgery, among others, and the patient was willing to proceed.  OPERATIVE IMPLANTS: None  @ENCIMAGES @  OPERATIVE FINDINGS: Patient had abscess that extended to the hindfoot.  There was not sufficient healthy soft tissue for wound closure.  OPERATIVE PROCEDURE: Patient was brought the operating room and underwent a general anesthetic.  After adequate levels anesthesia were obtained patient's left lower extremity was prepped using DuraPrep draped into a sterile field a timeout was called.  A fishmouth incision was made just proximal to the ulcerative tissue.  Patient had a purulent abscess that extended to the hindfoot.  This involved a significant amount of soft tissue that would not allow for wound closure due to the extension of the abscess.  The necrotic tissue was excised the wound was irrigated with normal saline electrocautery was used hemostasis.  The wound was packed open with Kerlix and a dry dressing was applied.  Patient was extubated taken the PACU in stable condition.   DISCHARGE PLANNING:  Antibiotic duration: Continue IV antibiotics  Weightbearing: Nonweightbearing on the left  Pain medication: Opioid pathway  Dressing care/ Wound VAC: Dry dressing reinforce as needed  Ambulatory devices: Walker  Discharge to: We will need to return to the  operating room on Friday for a below-knee amputation.  Follow-up: In the office 1 week post operative.

## 2021-02-08 NOTE — Progress Notes (Signed)
PROGRESS NOTE   Scott Greer  XLK:[id number]    DOB: 24-Jul-1956    DOA: 02/06/2021  PCP: [name], MD   I have briefly reviewed patients previous medical records in [facility].  Chief Complaint  Patient presents with  . Foot Pain    Brief Narrative:  65 year old male, lives alone, medical history significant for but not limited to hypertension, hyperlipidemia, type II DM, PAD, s/p right first toe amputation, recent left first and second ray amputations on 01/25/2021 by Dr. Lajoyce Corners, missed follow-up appointments and presented with pain, wound dehiscence and purulent drainage from left first and second ray amputation site.  Admitted for PAD with wound dehiscence of the first and second ray amputation with purulent drainage and dry gangrenous changes to the third toe.  Dr. Lajoyce Corners recommended transtibial amputation, patient declined, agreed to and underwent left transmetatarsal amputation 4/6.   Assessment & Plan:  Principal Problem:   Left foot infection Active Problems:   Essential hypertension   Hyperlipidemia   Noncompliance   Leukocytosis   Thrombocytosis   Hyponatremia   AKI (acute kidney injury) (HCC)   Hyperglycemia due to diabetes mellitus (HCC)   Hypoalbuminemia due to protein-calorie malnutrition (HCC)   Wound dehiscence   Gangrene of toe of left foot (HCC)   Cutaneous abscess of left foot   PAD with wound dehiscence of the recent first and second ray amputation (01/25/2021) with purulent drainage and dry gangrenous changes to the third toe.  X-ray of left foot showed gas throughout the soft tissues of the foot but no changes of acute osteomyelitis  Continue empirically started IV cefepime and vancomycin  Dr. Lajoyce Corners, orthopedics consulted, recommended transtibial amputation, patient declined, agreed to and underwent left transmetatarsal amputation 4/6.  Postop care per Dr. Lajoyce Corners.  Therapies evaluation.  Patient states that he lives alone and needs support at  home-TOC consulted.  Anemia  Hemoglobin was in the 12 g range during recent hospitalization, admitted with 11.2 and gradually drifted to 9.8.  Likely multifactorial due to hemodilution, acute illness and chronic disease.  Follow CBC daily.  Transfuse if hemoglobin 7 g or less.  Leukocytosis  Most likely secondary to infectious etiology above.  Presented with WBC of 28.5, down to 16.1.  Continue to trend daily CBCs.  Reactive thrombocytosis  Improving.  Hyponatremia  Could be related to dehydration.  Sodium 126 on admission, up to 130 after IV fluids.  Urine osmolarity: 482, urine sodium 12.  Serum osmolarity 290 on admission.   Trend daily BMP  Acute kidney injury  Baseline creatinine reportedly 1.1-1.3.  Presented with creatinine of 1.54.  IV fluids and creatinine has returned to baseline.  Type II DM with hyperglycemia  Reasonable inpatient control on SSI.  Adjust insulins as needed.  Essential hypertension  Ongoing soft but stable blood pressures.  Antihypertensives on hold.  Hyperlipidemia  Continue Lipitor.  Chronic systolic CHF  2D echo 12/26/2020: LVEF 25-30% with LV global hypokinesis.  Clinically euvolemic.  Moderate protein calorie malnutrition  Continue nutritional supplementation.  Noncompliance with treatment regimen  Patient missed for surgical follow-up.  Counseled extensively regarding importance of compliance with all aspects of medical care and he verbalized understanding   Body mass index is 24.88 kg/m.  Nutritional Status          DVT prophylaxis: SCDs Start: 02/07/21 0032     Code Status: Full Code Family Communication: SCD Disposition:  Status is: Inpatient  Remains inpatient appropriate because:Inpatient level of care appropriate  due to severity of illness   Dispo: The patient is from: Home              Anticipated d/c is to: Home              Patient currently is not medically stable to d/c.   Difficult to  place patient No        Consultants:   Orthopedics  Procedures:   4/6: Left transmetatarsal amputation by Dr. Lajoyce Corners  Antimicrobials:    Anti-infectives (From admission, onward)   Start     Dose/Rate Route Frequency Ordered Stop   02/08/21 1500  [MAR Hold]  vancomycin (VANCOREADY) IVPB 1000 mg/200 mL        (MAR Hold since Wed 02/08/2021 at 1352.Hold Reason: Transfer to a Procedural area.)   1,000 mg 200 mL/hr over 60 Minutes Intravenous Every 12 hours 02/08/21 0846     02/08/21 0600  ceFAZolin (ANCEF) IVPB 2g/100 mL premix        2 g 200 mL/hr over 30 Minutes Intravenous On call to O.R. 02/07/21 1835 02/08/21 1457   02/07/21 2200  vancomycin (VANCOCIN) 1,750 mg in sodium chloride 0.9 % 500 mL IVPB  Status:  Discontinued        1,750 mg 250 mL/hr over 120 Minutes Intravenous Every 24 hours 02/07/21 0429 02/07/21 0828   02/07/21 2200  vancomycin (VANCOREADY) IVPB 1750 mg/350 mL  Status:  Discontinued        1,750 mg 175 mL/hr over 120 Minutes Intravenous Every 24 hours 02/07/21 0828 02/08/21 0846   02/07/21 1000  ceFEPIme (MAXIPIME) 2 g in sodium chloride 0.9 % 100 mL IVPB  Status:  Discontinued        2 g 200 mL/hr over 30 Minutes Intravenous Every 12 hours 02/07/21 0429 02/07/21 0923   02/07/21 0930  [MAR Hold]  ceFEPIme (MAXIPIME) 2 g in sodium chloride 0.9 % 100 mL IVPB        (MAR Hold since Wed 02/08/2021 at 1352.Hold Reason: Transfer to a Procedural area.)   2 g 200 mL/hr over 30 Minutes Intravenous Every 8 hours 02/07/21 0923     02/06/21 2245  vancomycin (VANCOREADY) IVPB 1500 mg/300 mL        1,500 mg 150 mL/hr over 120 Minutes Intravenous  Once 02/06/21 2239 02/07/21 0231   02/06/21 2245  ceFEPIme (MAXIPIME) 2 g in sodium chloride 0.9 % 100 mL IVPB        2 g 200 mL/hr over 30 Minutes Intravenous  Once 02/06/21 2239 02/06/21 2344        Subjective:  Seen this morning prior to procedure.  Some left foot pain.  Repeatedly stated that he will be much more diligent going  forward regarding all aspects of his health care because he does not want any further amputations.  Requests assistance at home, lives alone.  Objective:   Vitals:   02/08/21 0727 02/08/21 1516 02/08/21 1531 02/08/21 1546  BP: 94/73 93/63 (!) 94/58 99/66  Pulse: 76 71 75 82  Resp: 18 11 10 13   Temp: 98.6 F (37 C) 97.6 F (36.4 C)  98.6 F (37 C)  TempSrc: Oral     SpO2: 100% 97% 91% 99%  Weight:      Height:        General exam: Middle-age male, moderately built and nourished lying comfortably in bed.  Oral mucosa moist. Respiratory system: Clear to auscultation. Respiratory effort normal. Cardiovascular system: S1 & S2 heard, RRR.  No JVD, murmurs, rubs, gallops or clicks. No pedal edema. Gastrointestinal system: Abdomen is nondistended, soft and nontender. No organomegaly or masses felt. Normal bowel sounds heard. Central nervous system: Alert and oriented. No focal neurological deficits. Extremities: Symmetric 5 x 5 power.  Left foot dressing clean and dry, did not attempt to open to examine.  No foul odor appreciated.  Right great toe amputation site has healed well. Skin: No rashes, lesions or ulcers Psychiatry: Judgement and insight appear normal. Mood & affect appropriate.     Data Reviewed:   I have personally reviewed following labs and imaging studies   CBC: Recent Labs  Lab 02/06/21 2057 02/07/21 0346 02/08/21 0429  WBC 28.5* 25.2* 16.1*  NEUTROABS 24.8*  --   --   HGB 11.2* 10.7* 9.8*  HCT 34.5* 33.6* 29.8*  MCV 81.4 84.2 79.9*  PLT 563* 445* 464*    Basic Metabolic Panel: Recent Labs  Lab 02/06/21 2057 02/07/21 0346 02/08/21 0715  NA 126* 129* 130*  K 5.0 4.4 4.5  CL 88* 92* 94*  CO2 27 27 25   GLUCOSE 237* 163* 145*  BUN 30* 32* 37*  CREATININE 1.54* 1.45* 1.27*  CALCIUM 9.1 8.8* 8.8*  MG  --  1.8  --   PHOS  --  3.2  --     Liver Function Tests: Recent Labs  Lab 02/06/21 2057 02/07/21 0346  AST 17 15  ALT 13 11  ALKPHOS 98 88   BILITOT 1.7* 0.8  PROT 7.5 6.7  ALBUMIN 2.5* 2.3*    CBG: Recent Labs  Lab 02/08/21 0724 02/08/21 1232 02/08/21 1517  GLUCAP 144* 126* 128*    Microbiology Studies:   Recent Results (from the past 240 hour(s))  Culture, blood (routine x 2)     Status: None (Preliminary result)   Collection Time: 02/06/21  8:58 PM   Specimen: BLOOD RIGHT ARM  Result Value Ref Range Status   Specimen Description BLOOD RIGHT ARM  Final   Special Requests   Final    BOTTLES DRAWN AEROBIC AND ANAEROBIC Blood Culture results may not be optimal due to an excessive volume of blood received in culture bottles   Culture   Final    NO GROWTH 2 DAYS Performed at [facility], [address] [place], [place] [address]    Report Status PENDING  Incomplete  Culture, blood (routine x 2)     Status: None (Preliminary result)   Collection Time: 02/06/21  8:58 PM   Specimen: BLOOD LEFT ARM  Result Value Ref Range Status   Specimen Description BLOOD LEFT ARM  Final   Special Requests   Final    BOTTLES DRAWN AEROBIC ONLY Blood Culture results may not be optimal due to an excessive volume of blood received in culture bottles   Culture   Final    NO GROWTH 2 DAYS Performed at [facility], [address], [place], [place] [address]    Report Status PENDING  Incomplete  SARS CORONAVIRUS 2 (TAT 6-24 HRS) Nasopharyngeal Nasopharyngeal Swab     Status: None   Collection Time: 02/07/21  1:07 AM   Specimen: Nasopharyngeal Swab  Result Value Ref Range Status   SARS Coronavirus 2 NEGATIVE NEGATIVE Final    Comment: (NOTE) SARS-CoV-2 target nucleic acids are NOT DETECTED.  The SARS-CoV-2 RNA is generally detectable in upper and lower respiratory specimens during the acute phase of infection. Negative results do not preclude SARS-CoV-2 infection, do not rule  out co-infections with other pathogens, and should not be used as the sole basis for treatment or other patient management  decisions. Negative results must be combined with clinical observations, patient history, and epidemiological information. The expected result is Negative.  Fact Sheet for Patients: HairSlick.no  Fact Sheet for Healthcare Providers: quierodirigir.com  This test is not yet approved or cleared by the [place] FDA and  has been authorized for detection and/or diagnosis of SARS-CoV-2 by FDA under an Emergency Use Authorization (EUA). This EUA will remain  in effect (meaning this test can be used) for the duration of the COVID-19 declaration under Se ction 564(b)(1) of the Act, 21 U.S.C. section 360bbb-3(b)(1), unless the authorization is terminated or revoked sooner.  Performed at [facility], [address], [place], [place] [address]   Surgical pcr screen     Status: None   Collection Time: 02/07/21  7:02 PM   Specimen: Nasal Mucosa; Nasal Swab  Result Value Ref Range Status   MRSA, PCR NEGATIVE NEGATIVE Final   Staphylococcus aureus NEGATIVE NEGATIVE Final    Comment: (NOTE) The Xpert SA Assay (FDA approved for NASAL specimens in patients 55 years of age and older), is one component of a comprehensive surveillance program. It is not intended to diagnose infection nor to guide or monitor treatment. Performed at [facility], [address], [place], [place] [address]      Radiology Studies:  DG Foot Complete Left  Result Date: 02/06/2021 CLINICAL DATA:  Wound dehiscence EXAM: LEFT FOOT - COMPLETE 3+ VIEW COMPARISON:  None. FINDINGS: Prior 1st and 2nd transmetatarsal amputation. Gas noted throughout the soft tissues of the forefoot. No bone destruction to suggest osteomyelitis. No acute fracture, subluxation or dislocation. IMPRESSION: Gas throughout the soft tissues of the foot. No changes of acute osteomyelitis by plain film. Electronically Signed   By: Charlett Nose M.D.   On: 02/06/2021 21:29      Scheduled Meds:   . [MAR Hold] feeding supplement (GLUCERNA SHAKE)  237 mL Oral TID BM  . [MAR Hold] insulin aspart  0-15 Units Subcutaneous Q4H    Continuous Infusions:   . [MAR Hold] ceFEPime (MAXIPIME) IV 2 g (02/08/21 1243)  . lactated ringers 10 mL/hr at 02/08/21 1449  . [MAR Hold] vancomycin       LOS: 1 day     Marcellus Scott, MD, [place], [facility]. [facility]    To contact the attending provider between 7A-7P or the covering provider during after hours 7P-7A, please log into the web site [email or web address] and access using universal [place] password for that web site. If you do not have the password, please call the hospital operator.  02/08/2021, 4:04 PM

## 2021-02-08 NOTE — Anesthesia Preprocedure Evaluation (Addendum)
Anesthesia Evaluation  Patient identified by MRN, date of birth, ID band Patient awake    Reviewed: Allergy & Precautions, NPO status , Patient's Chart, lab work & pertinent test results, reviewed documented beta blocker date and time   Airway Mallampati: II  TM Distance: >3 FB     Dental  (+) Poor Dentition, Dental Advisory Given   Pulmonary Current Smoker and Patient abstained from smoking.,    Pulmonary exam normal breath sounds clear to auscultation + decreased breath sounds      Cardiovascular hypertension, Pt. on medications +CHF  Normal cardiovascular exam Rhythm:Regular Rate:Normal  Echo 12/26/20 1. Left ventricular ejection fraction, by estimation, is 25 to 30%. The left ventricle has severely decreased function. The left ventricle demonstrates global hypokinesis. The left ventricular internal cavity size was mildly dilated. There is mild left ventricular hypertrophy. Left ventricular diastolic parameters are indeterminate.  2. Right ventricular systolic function is severely reduced. The right ventricular size is mildly enlarged. There is mildly elevated pulmonary artery systolic pressure.  3. Right atrial size was mildly dilated.  4. The mitral valve is abnormal. Mild mitral valve regurgitation.  5. The aortic valve is grossly normal. Aortic valve regurgitation is not visualized. Mild to moderate aortic valve sclerosis/calcification is present, without any evidence of aortic stenosis.  6. The inferior vena cava is dilated in size with <50% respiratory variability, suggesting right atrial pressure of 15 mmHg.   EKG 02/08/21 NSR, unusual P axis, lateral infarct  Cardiac Cath 01/02/21 Ost LAD to Prox LAD lesion is 30% stenosed   Neuro/Psych Peripheral neuropathy Glaucoma negative psych ROS   GI/Hepatic negative GI ROS, Neg liver ROS,   Endo/Other  diabetes, Poorly Controlled, Type 2, Oral Hypoglycemic  AgentsHyperlipidemia  Renal/GU Renal InsufficiencyRenal disease  negative genitourinary   Musculoskeletal Osteomyelitis left foot, dehiscence of amputation wound   Abdominal   Peds  Hematology  (+) anemia ,   Anesthesia Other Findings   Reproductive/Obstetrics ED                            Anesthesia Physical Anesthesia Plan  ASA: III  Anesthesia Plan: General   Post-op Pain Management:  Regional for Post-op pain   Induction: Intravenous  PONV Risk Score and Plan: Treatment may vary due to age or medical condition, Midazolam and Ondansetron  Airway Management Planned: LMA  Additional Equipment:   Intra-op Plan:   Post-operative Plan: Extubation in OR  Informed Consent: I have reviewed the patients History and Physical, chart, labs and discussed the procedure including the risks, benefits and alternatives for the proposed anesthesia with the patient or authorized representative who has indicated his/her understanding and acceptance.     Dental advisory given  Plan Discussed with: CRNA and Anesthesiologist  Anesthesia Plan Comments:         Anesthesia Quick Evaluation

## 2021-02-08 NOTE — Anesthesia Procedure Notes (Signed)
Anesthesia Regional Block: Adductor canal block   Pre-Anesthetic Checklist: ,, timeout performed, Correct Patient, Correct Site, Correct Laterality, Correct Procedure, Correct Position, site marked, Risks and benefits discussed,  Surgical consent,  Pre-op evaluation,  At surgeon's request and post-op pain management  Laterality: Left  Prep: chloraprep       Needles:  Injection technique: Single-shot  Needle Type: Echogenic Stimulator Needle     Needle Length: 9cm  Needle Gauge: 21   Needle insertion depth: 5 cm   Additional Needles:   Procedures:,,,, ultrasound used (permanent image in chart),,,,  Narrative:  Start time: 02/08/2021 2:34 PM End time: 02/08/2021 2:39 PM Injection made incrementally with aspirations every 5 mL.  Performed by: Personally  Anesthesiologist: Mal Amabile, MD  Additional Notes: Timeout performed. Patient sedated. Relevant anatomy ID'd using [place]. Incremental 2-63ml injection of LA with frequent aspiration. Patient tolerated procedure well.        Left Adductor Canal Block

## 2021-02-08 NOTE — Interval H&P Note (Signed)
History and Physical Interval Note:  02/08/2021 6:52 AM  Scott Greer  has presented today for surgery, with the diagnosis of Abscess Left Foot.  The various methods of treatment have been discussed with the patient and family. After consideration of risks, benefits and other options for treatment, the patient has consented to  Procedure(s): LEFT TRANSMETATARSAL AMPUTATION (Left) as a surgical intervention.  The patient's history has been reviewed, patient examined, no change in status, stable for surgery.  I have reviewed the patient's chart and labs.  Questions were answered to the patient's satisfaction.     [name]

## 2021-02-08 NOTE — Anesthesia Procedure Notes (Signed)
Anesthesia Regional Block: Popliteal block   Pre-Anesthetic Checklist: ,, timeout performed, Correct Patient, Correct Site, Correct Laterality, Correct Procedure, Correct Position, site marked, Risks and benefits discussed,  Surgical consent,  Pre-op evaluation,  At surgeon's request and post-op pain management  Laterality: Left  Prep: chloraprep       Needles:  Injection technique: Single-shot  Needle Type: Echogenic Stimulator Needle     Needle Length: 9cm  Needle Gauge: 21   Needle insertion depth: 6 cm   Additional Needles:   Procedures:,,,, ultrasound used (permanent image in chart),,,,  Narrative:  Start time: 02/08/2021 2:29 PM End time: 02/08/2021 2:34 PM Injection made incrementally with aspirations every 5 mL.  Performed by: Personally  Anesthesiologist: Mal Amabile, MD  Additional Notes: Timeout performed. Patient sedated. Relevant anatomy ID'd using [place]. Incremental 2-49ml injection of LA with frequent aspiration. Patient tolerated procedure well.        Left Popliteal Block

## 2021-02-09 ENCOUNTER — Encounter (HOSPITAL_COMMUNITY): Payer: Self-pay | Admitting: Orthopedic Surgery

## 2021-02-09 ENCOUNTER — Ambulatory Visit: Payer: Self-pay

## 2021-02-09 DIAGNOSIS — E1165 Type 2 diabetes mellitus with hyperglycemia: Secondary | ICD-10-CM | POA: Diagnosis not present

## 2021-02-09 DIAGNOSIS — E871 Hypo-osmolality and hyponatremia: Secondary | ICD-10-CM | POA: Diagnosis not present

## 2021-02-09 DIAGNOSIS — D72829 Elevated white blood cell count, unspecified: Secondary | ICD-10-CM | POA: Diagnosis not present

## 2021-02-09 DIAGNOSIS — L089 Local infection of the skin and subcutaneous tissue, unspecified: Secondary | ICD-10-CM | POA: Diagnosis not present

## 2021-02-09 DIAGNOSIS — I10 Essential (primary) hypertension: Secondary | ICD-10-CM | POA: Diagnosis not present

## 2021-02-09 DIAGNOSIS — N179 Acute kidney failure, unspecified: Secondary | ICD-10-CM | POA: Diagnosis not present

## 2021-02-09 DIAGNOSIS — E875 Hyperkalemia: Secondary | ICD-10-CM | POA: Diagnosis not present

## 2021-02-09 LAB — CBC
HCT: 28.1 % — ABNORMAL LOW (ref 39.0–52.0)
Hemoglobin: 9.2 g/dL — ABNORMAL LOW (ref 13.0–17.0)
MCH: 26.4 pg (ref 26.0–34.0)
MCHC: 32.7 g/dL (ref 30.0–36.0)
MCV: 80.7 fL (ref 80.0–100.0)
Platelets: 457 10*3/uL — ABNORMAL HIGH (ref 150–400)
RBC: 3.48 MIL/uL — ABNORMAL LOW (ref 4.22–5.81)
RDW: 15.9 % — ABNORMAL HIGH (ref 11.5–15.5)
WBC: 14.5 10*3/uL — ABNORMAL HIGH (ref 4.0–10.5)
nRBC: 0 % (ref 0.0–0.2)

## 2021-02-09 LAB — BASIC METABOLIC PANEL
Anion gap: 7 (ref 5–15)
BUN: 36 mg/dL — ABNORMAL HIGH (ref 8–23)
CO2: 25 mmol/L (ref 22–32)
Calcium: 8.4 mg/dL — ABNORMAL LOW (ref 8.9–10.3)
Chloride: 96 mmol/L — ABNORMAL LOW (ref 98–111)
Creatinine, Ser: 1.15 mg/dL (ref 0.61–1.24)
GFR, Estimated: >60 mL/min (ref >60)
Glucose, Bld: 162 mg/dL — ABNORMAL HIGH (ref 70–99)
Potassium: 5 mmol/L (ref 3.5–5.1)
Sodium: 128 mmol/L — ABNORMAL LOW (ref 135–145)

## 2021-02-09 LAB — GLUCOSE, CAPILLARY
Glucose-Capillary: 165 mg/dL — ABNORMAL HIGH (ref 70–99)
Glucose-Capillary: 197 mg/dL — ABNORMAL HIGH (ref 70–99)
Glucose-Capillary: 153 mg/dL — ABNORMAL HIGH (ref 70–99)
Glucose-Capillary: 111 mg/dL — ABNORMAL HIGH (ref 70–99)
Glucose-Capillary: 159 mg/dL — ABNORMAL HIGH (ref 70–99)

## 2021-02-09 MED ORDER — SODIUM CHLORIDE 0.9 % IV SOLN: INTRAVENOUS | Status: DC

## 2021-02-09 NOTE — Plan of Care (Signed)
  Problem: Activity: Goal: Risk for activity intolerance will decrease Outcome: Progressing   Problem: Pain Managment: Goal: General experience of comfort will improve Outcome: Progressing   

## 2021-02-09 NOTE — Progress Notes (Signed)
Pt's left foot dressing had small amount of drainage. Dressing reinforced with guaze and coban.

## 2021-02-09 NOTE — Progress Notes (Signed)
PROGRESS NOTE   Scott Greer  MWU:[id number]    DOB: 05/23/1956    DOA: 02/06/2021  PCP: [name], MD   I have briefly reviewed patients previous medical records in [facility].  Chief Complaint  Patient presents with  . Foot Pain    Brief Narrative:  65 year old male, lives alone, medical history significant for but not limited to hypertension, hyperlipidemia, type II DM, PAD, s/p right first toe amputation, recent left first and second ray amputations on 01/25/2021 by Dr. Lajoyce Corners, missed follow-up appointments and presented with pain, wound dehiscence and purulent drainage from left first and second ray amputation site.  Admitted for PAD with wound dehiscence of the first and second ray amputation with purulent drainage and dry gangrenous changes to the third toe.  Dr. Lajoyce Corners recommended transtibial amputation, patient declined, agreed to and underwent left transmetatarsal amputation 4/6.  However per Dr. Lajoyce Corners, he had extensive abscess that extended up to the hindfoot, strongly recommends BKA but patient thus far has not consented.   Assessment & Plan:  Principal Problem:   Left foot infection Active Problems:   Essential hypertension   Hyperlipidemia   Noncompliance   Leukocytosis   Thrombocytosis   Hyponatremia   AKI (acute kidney injury) (HCC)   Hyperglycemia due to diabetes mellitus (HCC)   Hypoalbuminemia due to protein-calorie malnutrition (HCC)   Wound dehiscence   Gangrene of toe of left foot (HCC)   Cutaneous abscess of left foot   PAD with wound dehiscence of the recent first and second ray amputation (01/25/2021) with purulent drainage and dry gangrenous changes to the third toe.  X-ray of left foot showed gas throughout the soft tissues of the foot but no changes of acute osteomyelitis  Continue empirically started IV cefepime and vancomycin  Dr. Lajoyce Corners, orthopedics consulted, recommended transtibial amputation, patient declined, agreed to and underwent  left transmetatarsal amputation 4/6.  Therapies evaluation.  Patient states that he lives alone and needs support at home-TOC consulted.  I discussed with Dr. Lajoyce Corners 4/7.  Patient had extensive abscess that extended up to the hindfoot, he was apparently unable to close the wound which was packed open.  He strongly recommends BKA, he discussed this with the patient now and even had done this prior to the TMA but patient had then refused transtibial amputation.    Patient continues to refuse BKA despite warning him that the severe infection is likely going to progress proximally, threatening his limb and could even become life-threatening from sepsis.    I strongly recommended that he think over and reconsider his decision.  Patient plans to speak with his sister and nieces.  Dr. Lajoyce Corners plans to discuss with him again tomorrow  Anemia  Hemoglobin was in the 12 g range during recent hospitalization, admitted with 11.2 and gradually drifted to 9.8.  Mild dropped to 9.2.  Likely multifactorial due to hemodilution, acute illness and chronic disease.  Follow CBC daily.  Transfuse if hemoglobin 7 g or less.  Leukocytosis  Most likely secondary to infectious etiology above.  Presented with WBC of 28.5, down to 16.1 >14.5.  Continue to trend daily CBCs.  Reactive thrombocytosis  Improving.  Hyponatremia  Could be related to dehydration.  Sodium 126 on admission, had improved to 130 but back down to 128, unclear reason.  Asymptomatic.  Brief IV saline hydration overnight and follow BMP in a.m.  Urine osmolarity: 482, urine sodium 12.  Serum osmolarity 290 on admission.   Trend daily  BMP  Acute kidney injury  Baseline creatinine reportedly 1.1-1.3.  Presented with creatinine of 1.54.  IV fluids and creatinine has returned to baseline.  Type II DM with hyperglycemia  Reasonable inpatient control on SSI.  Adjust insulins as needed.  Essential hypertension  Ongoing soft but stable  blood pressures.  Antihypertensives on hold.  Hyperlipidemia  Continue Lipitor.  Chronic systolic CHF  2D echo 12/26/2020: LVEF 25-30% with LV global hypokinesis.  Clinically euvolemic.  Moderate protein calorie malnutrition  Continue nutritional supplementation.  Noncompliance with treatment regimen  Patient missed for surgical follow-up.  Counseled extensively regarding importance of compliance with all aspects of medical care and he verbalized understanding   Body mass index is 24.88 kg/m.     DVT prophylaxis: SCD's Start: 02/08/21 1634 SCDs Start: 02/07/21 0032     Code Status: Full Code Family Communication: Patient declined MDs offer to speak with his family. Disposition:  Status is: Inpatient  Remains inpatient appropriate because:Inpatient level of care appropriate due to severity of illness   Dispo: The patient is from: Home              Anticipated d/c is to: Home              Patient currently is not medically stable to d/c.   Difficult to place patient No        Consultants:   Orthopedics  Procedures:   4/6: Left transmetatarsal amputation by Dr. Lajoyce Corners  Antimicrobials:    Anti-infectives (From admission, onward)   Start     Dose/Rate Route Frequency Ordered Stop   02/08/21 1500  vancomycin (VANCOREADY) IVPB 1000 mg/200 mL        1,000 mg 200 mL/hr over 60 Minutes Intravenous Every 12 hours 02/08/21 0846     02/08/21 0600  ceFAZolin (ANCEF) IVPB 2g/100 mL premix        2 g 200 mL/hr over 30 Minutes Intravenous On call to O.R. 02/07/21 1835 02/08/21 1457   02/07/21 2200  vancomycin (VANCOCIN) 1,750 mg in sodium chloride 0.9 % 500 mL IVPB  Status:  Discontinued        1,750 mg 250 mL/hr over 120 Minutes Intravenous Every 24 hours 02/07/21 0429 02/07/21 0828   02/07/21 2200  vancomycin (VANCOREADY) IVPB 1750 mg/350 mL  Status:  Discontinued        1,750 mg 175 mL/hr over 120 Minutes Intravenous Every 24 hours 02/07/21 0828 02/08/21 0846    02/07/21 1000  ceFEPIme (MAXIPIME) 2 g in sodium chloride 0.9 % 100 mL IVPB  Status:  Discontinued        2 g 200 mL/hr over 30 Minutes Intravenous Every 12 hours 02/07/21 0429 02/07/21 0923   02/07/21 0930  ceFEPIme (MAXIPIME) 2 g in sodium chloride 0.9 % 100 mL IVPB        2 g 200 mL/hr over 30 Minutes Intravenous Every 8 hours 02/07/21 0923     02/06/21 2245  vancomycin (VANCOREADY) IVPB 1500 mg/300 mL        1,500 mg 150 mL/hr over 120 Minutes Intravenous  Once 02/06/21 2239 02/07/21 0231   02/06/21 2245  ceFEPIme (MAXIPIME) 2 g in sodium chloride 0.9 % 100 mL IVPB        2 g 200 mL/hr over 30 Minutes Intravenous  Once 02/06/21 2239 02/06/21 2344        Subjective:  Seen this morning.  Appeared clearly frustrated because he has to have more surgeries and really does not  want to have any more part of his body open "cut off".  Appears to be struggling with extremely difficult social situation i.e. lack of family support, may be evicted from his house because of not paying rent etc.  Objective:   Vitals:   02/08/21 1934 02/09/21 0307 02/09/21 0814 02/09/21 1507  BP: 105/68 108/72 107/64 119/80  Pulse: 80 80 67 75  Resp: 17 18 17 17   Temp: 98.9 F (37.2 C) 97.9 F (36.6 C) 97.9 F (36.6 C) 97.8 F (36.6 C)  TempSrc:  Oral Oral Oral  SpO2: 98% 100% 100% 100%  Weight:      Height:        General exam: Middle-age male, moderately built and nourished sitting up in chair.  Just finished working with PT.  Able to maintain nonweightbearing on left lower extremity. Respiratory system: Clear to auscultation. Respiratory effort normal. Cardiovascular system: S1 & S2 heard, RRR. No JVD, murmurs, rubs, gallops or clicks. No pedal edema. Gastrointestinal system: Abdomen is nondistended, soft and nontender. No organomegaly or masses felt. Normal bowel sounds heard. Central nervous system: Alert and oriented. No focal neurological deficits. Extremities: Symmetric 5 x 5 power.  Left foot  postop dressing clean and dry.  Right great toe amputation site has healed well. Skin: No rashes, lesions or ulcers Psychiatry: Judgement and insight appear normal. Mood & affect appropriate.     Data Reviewed:   I have personally reviewed following labs and imaging studies   CBC: Recent Labs  Lab 02/06/21 2057 02/07/21 0346 02/08/21 0429 02/09/21 0338  WBC 28.5* 25.2* 16.1* 14.5*  NEUTROABS 24.8*  --   --   --   HGB 11.2* 10.7* 9.8* 9.2*  HCT 34.5* 33.6* 29.8* 28.1*  MCV 81.4 84.2 79.9* 80.7  PLT 563* 445* 464* 457*    Basic Metabolic Panel: Recent Labs  Lab 02/07/21 0346 02/08/21 0715 02/09/21 0338  NA 129* 130* 128*  K 4.4 4.5 5.0  CL 92* 94* 96*  CO2 27 25 25   GLUCOSE 163* 145* 162*  BUN 32* 37* 36*  CREATININE 1.45* 1.27* 1.15  CALCIUM 8.8* 8.8* 8.4*  MG 1.8  --   --   PHOS 3.2  --   --     Liver Function Tests: Recent Labs  Lab 02/06/21 2057 02/07/21 0346  AST 17 15  ALT 13 11  ALKPHOS 98 88  BILITOT 1.7* 0.8  PROT 7.5 6.7  ALBUMIN 2.5* 2.3*    CBG: Recent Labs  Lab 02/09/21 0811 02/09/21 1217 02/09/21 1643  GLUCAP 197* 153* 111*    Microbiology Studies:   Recent Results (from the past 240 hour(s))  Culture, blood (routine x 2)     Status: None (Preliminary result)   Collection Time: 02/06/21  8:58 PM   Specimen: BLOOD RIGHT ARM  Result Value Ref Range Status   Specimen Description BLOOD RIGHT ARM  Final   Special Requests   Final    BOTTLES DRAWN AEROBIC AND ANAEROBIC Blood Culture results may not be optimal due to an excessive volume of blood received in culture bottles   Culture   Final    NO GROWTH 3 DAYS Performed at [facility], [address], [place], [address] [place]    Report Status PENDING  Incomplete  Culture, blood (routine x 2)     Status: None (Preliminary result)   Collection Time: 02/06/21  8:58 PM   Specimen: BLOOD LEFT ARM  Result Value Ref Range Status  Specimen Description BLOOD LEFT ARM  Final    Special Requests   Final    BOTTLES DRAWN AEROBIC ONLY Blood Culture results may not be optimal due to an excessive volume of blood received in culture bottles   Culture   Final    NO GROWTH 3 DAYS Performed at [facility], [address] [place], [place] [address]    Report Status PENDING  Incomplete  SARS CORONAVIRUS 2 (TAT 6-24 HRS) Nasopharyngeal Nasopharyngeal Swab     Status: None   Collection Time: 02/07/21  1:07 AM   Specimen: Nasopharyngeal Swab  Result Value Ref Range Status   SARS Coronavirus 2 NEGATIVE NEGATIVE Final    Comment: (NOTE) SARS-CoV-2 target nucleic acids are NOT DETECTED.  The SARS-CoV-2 RNA is generally detectable in upper and lower respiratory specimens during the acute phase of infection. Negative results do not preclude SARS-CoV-2 infection, do not rule out co-infections with other pathogens, and should not be used as the sole basis for treatment or other patient management decisions. Negative results must be combined with clinical observations, patient history, and epidemiological information. The expected result is Negative.  Fact Sheet for Patients: HairSlick.no  Fact Sheet for Healthcare Providers: quierodirigir.com  This test is not yet approved or cleared by the [place] FDA and  has been authorized for detection and/or diagnosis of SARS-CoV-2 by FDA under an Emergency Use Authorization (EUA). This EUA will remain  in effect (meaning this test can be used) for the duration of the COVID-19 declaration under Se ction 564(b)(1) of the Act, 21 U.S.C. section 360bbb-3(b)(1), unless the authorization is terminated or revoked sooner.  Performed at [facility], [address] [place], [place] [address]   Surgical pcr screen     Status: None   Collection Time: 02/07/21  7:02 PM   Specimen: Nasal Mucosa; Nasal Swab  Result Value Ref Range Status   MRSA, PCR NEGATIVE NEGATIVE  Final   Staphylococcus aureus NEGATIVE NEGATIVE Final    Comment: (NOTE) The Xpert SA Assay (FDA approved for NASAL specimens in patients 23 years of age and older), is one component of a comprehensive surveillance program. It is not intended to diagnose infection nor to guide or monitor treatment. Performed at [facility], [address] [place], [place] [address]      Radiology Studies:  No results found.   Scheduled Meds:   . docusate sodium  100 mg Oral Daily  . feeding supplement (GLUCERNA SHAKE)  237 mL Oral TID BM  . insulin aspart  0-15 Units Subcutaneous TID WC  . insulin aspart  0-5 Units Subcutaneous QHS    Continuous Infusions:   . 0.9 % NaCl with KCl 20 mEq / L 75 mL/hr at 02/08/21 1804  . ceFEPime (MAXIPIME) IV 2 g (02/09/21 1327)  . vancomycin 1,000 mg (02/09/21 1444)     LOS: 2 days     Marcellus Scott, MD, [place], [place] [facility]. [facility]    To contact the attending provider between 7A-7P or the covering provider during after hours 7P-7A, please log into the web site [email or web address] and access using universal [place] password for that web site. If you do not have the password, please call the hospital operator.  02/09/2021, 5:27 PM

## 2021-02-09 NOTE — Progress Notes (Signed)
RN smelled strong odor of cigarette smoke coming from pt's room. Two of pt's friends had just visited. RN went to room to tell pt he is not to smoke in room. He stated that he did not and that he knows he can't. That it was one of his friends who just left.

## 2021-02-09 NOTE — Progress Notes (Signed)
Patient ID: Scott Greer, male   DOB: 11-05-1956, 65 y.o.   MRN: [id number] Patient is postoperative day 1 transmetatarsal amputation.  Patient had extensive abscess that extended up to the hindfoot.  The wound was packed open.  I had a long discussion with the patient regarding the need to proceed with a below the knee amputation.  Discussed that this is what we reviewed prior to surgery and that prior to surgery I felt that this was his best surgical option.  Patient states that he will not proceed with a below the knee amputation.  I discussed that this is AGAINST MEDICAL ADVICE.  Patient related his life history and states that he cannot proceed with a below the knee amputation.  I will follow-up again tomorrow ideally I would have proceeded with a below the knee amputation tomorrow.

## 2021-02-09 NOTE — Plan of Care (Signed)

## 2021-02-09 NOTE — Evaluation (Signed)
Physical Therapy Evaluation Patient Details Name: Scott Greer MRN: [id number] DOB: 1956-01-17 Today's Date: 02/09/2021   History of Present Illness  65 y.o. male admitted 4/4 with left foot pain from the wound site.  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 Lajoyce Corners, he has missed 2 postoperative visits due to oversleeping and not being able to make it to the appointment.  He noted that the wound opened again and due to ongoing pain in the foot, he decided to go to the ED for further evaluation. Past medical history significant for hypertension, hyperlipidemia, T2DM and recent left second toe amputation  Clinical Impression  Pt participated in evaluation with max cueing for redirection. Pt with tangential conversation and unable to remain on task. Increased concerns regarding surgeries and desire to keep foot. Pt state he was I prior to admission but due to pain had multiple falls at home. Pt was able to perform OOB tasks with min A for balance and assist for set up for safety. Pt will benefit from skilled PT to address deficits in balance, strength, coordination, gait, endurance, safety and DME education to maximize independence with functional mobility prior to discharge.     Follow Up Recommendations Home health PT;Supervision for mobility/OOB vs, SNF    Equipment Recommendations  Rolling walker with 5" wheels;Wheelchair (measurements PT);Wheelchair cushion (measurements PT);3in1 (PT)    Recommendations for Other Services       Precautions / Restrictions Precautions Precautions: Fall Restrictions Weight Bearing Restrictions: Yes LUE Weight Bearing: Non weight bearing LLE Weight Bearing: Touchdown weight bearing Other Position/Activity Restrictions: post op note 4/6 NWB      Mobility  Bed Mobility Overal bed mobility: Needs Assistance Bed Mobility: Supine to Sit     Supine to sit: Supervision;HOB elevated     General bed mobility  comments: set up to improve safety. cueing for NWB    Transfers Overall transfer level: Needs assistance Equipment used: Rolling walker (2 wheeled) Transfers: Sit to/from [employer] Sit to Stand: Min assist Stand pivot transfers: Min assist          Ambulation/Gait                Stairs            Wheelchair Mobility    Modified Rankin (Stroke Patients Only)       Balance Overall balance assessment: Needs assistance Sitting-balance support: Single extremity supported;No upper extremity supported;Feet supported Sitting balance-Leahy Scale: Fair     Standing balance support: Bilateral upper extremity supported Standing balance-Leahy Scale: Poor                               Pertinent Vitals/Pain Faces Pain Scale: Hurts even more Pain Location: L foot Pain Descriptors / Indicators: Grimacing;Guarding    Home Living Family/patient expects to be discharged to:: Private residence Living Arrangements: Alone Available Help at Discharge: Neighbor Type of Home: Apartment Home Access: Other (comment) (pt state he has no steps)     Home Layout: One level Home Equipment: Cane - single point;Crutches      Prior Function Level of Independence: Independent;Independent with assistive device(s)         Comments: endorses struggling with managing IADLs, mobility, toilet trasnfers, and LB ADLs. "I'm in the bed most of the day."     Hand Dominance        Extremity/Trunk Assessment  Upper Extremity Assessment Upper Extremity Assessment: Defer to OT evaluation    Lower Extremity Assessment Lower Extremity Assessment: Generalized weakness       Communication   Communication: No difficulties  Cognition Arousal/Alertness: Awake/alert Behavior During Therapy: Flat affect;Anxious;WFL for tasks assessed/performed Overall Cognitive Status: No family/caregiver present to determine baseline cognitive functioning                                  General Comments: some problem solving and STM deficits noted. Tangential responses. Verbal perseveration. Endores feeling overwhelmed with "everything." Pt concerned about housing as rent was increased to more than he can afford.      General Comments General comments (skin integrity, edema, etc.): VSS. Increased time spent discussing pts concerns regarding recent surgery and conversation this AM with Dr Lajoyce Corners regarding additional surgeries. Education regarding prosethetics and advancement    Exercises     Assessment/Plan    PT Assessment Patient needs continued PT services  PT Problem List Decreased strength;Decreased mobility;Decreased safety awareness;Decreased coordination;Decreased activity tolerance;Decreased knowledge of use of DME       PT Treatment Interventions Therapeutic exercise;Wheelchair mobility training;DME instruction;Gait training;Balance training;Functional mobility training;Therapeutic activities;Patient/family education    PT Goals (Current goals can be found in the Care Plan section)  Acute Rehab PT Goals Patient Stated Goal: To go home and regain independence PT Goal Formulation: With patient Time For Goal Achievement: 02/23/21 Potential to Achieve Goals: Good    Frequency Min 3X/week   Barriers to discharge        Co-evaluation               AM-PAC PT "6 Clicks" Mobility  Outcome Measure Help needed turning from your back to your side while in a flat bed without using bedrails?: None Help needed moving from lying on your back to sitting on the side of a flat bed without using bedrails?: A Little Help needed moving to and from a bed to a chair (including a wheelchair)?: A Little Help needed standing up from a chair using your arms (e.g., wheelchair or bedside chair)?: A Little Help needed to walk in hospital room?: A Lot Help needed climbing 3-5 steps with a railing? : Total 6 Click Score: 16    End of  Session Equipment Utilized During Treatment: Gait belt Activity Tolerance: Patient tolerated treatment well;Patient limited by pain Patient left: in chair;with chair alarm set;with call bell/phone within reach Nurse Communication: Mobility status PT Visit Diagnosis: Unsteadiness on feet (R26.81);Other abnormalities of gait and mobility (R26.89);Repeated falls (R29.6);Muscle weakness (generalized) (M62.81);Difficulty in walking, not elsewhere classified (R26.2)    Time: 6546-5035 PT Time Calculation (min) (ACUTE ONLY): 40 min   Charges:   PT Evaluation $PT Eval Low Complexity: 1 Low PT Treatments $Therapeutic Activity: 23-37 mins       Ginette Otto, DPT Acute Rehabilitation Services [phone]  Lucretia Field 02/09/2021, 10:10 AM

## 2021-02-10 ENCOUNTER — Encounter (HOSPITAL_COMMUNITY): Payer: Self-pay | Admitting: Internal Medicine

## 2021-02-10 ENCOUNTER — Inpatient Hospital Stay (HOSPITAL_COMMUNITY): Payer: Medicaid Other | Admitting: Anesthesiology

## 2021-02-10 ENCOUNTER — Ambulatory Visit: Payer: Medicaid Other | Admitting: Internal Medicine

## 2021-02-10 ENCOUNTER — Encounter (HOSPITAL_COMMUNITY)
Admission: EM | Disposition: A | Discharge status: Home Health | Payer: Self-pay | Source: Home / Self Care | Attending: Internal Medicine

## 2021-02-10 ENCOUNTER — Other Ambulatory Visit: Payer: Self-pay | Admitting: Physician Assistant

## 2021-02-10 DIAGNOSIS — L089 Local infection of the skin and subcutaneous tissue, unspecified: Secondary | ICD-10-CM

## 2021-02-10 DIAGNOSIS — E440 Moderate protein-calorie malnutrition: Secondary | ICD-10-CM | POA: Diagnosis not present

## 2021-02-10 DIAGNOSIS — I5042 Chronic combined systolic (congestive) and diastolic (congestive) heart failure: Secondary | ICD-10-CM | POA: Diagnosis not present

## 2021-02-10 DIAGNOSIS — D72829 Elevated white blood cell count, unspecified: Secondary | ICD-10-CM | POA: Diagnosis not present

## 2021-02-10 DIAGNOSIS — G8918 Other acute postprocedural pain: Secondary | ICD-10-CM | POA: Diagnosis not present

## 2021-02-10 DIAGNOSIS — E785 Hyperlipidemia, unspecified: Secondary | ICD-10-CM | POA: Diagnosis not present

## 2021-02-10 DIAGNOSIS — I70262 Atherosclerosis of native arteries of extremities with gangrene, left leg: Secondary | ICD-10-CM | POA: Diagnosis not present

## 2021-02-10 DIAGNOSIS — L02612 Cutaneous abscess of left foot: Principal | ICD-10-CM

## 2021-02-10 DIAGNOSIS — E875 Hyperkalemia: Secondary | ICD-10-CM | POA: Diagnosis not present

## 2021-02-10 DIAGNOSIS — T8131XA Disruption of external operation (surgical) wound, not elsewhere classified, initial encounter: Secondary | ICD-10-CM | POA: Diagnosis not present

## 2021-02-10 DIAGNOSIS — N179 Acute kidney failure, unspecified: Secondary | ICD-10-CM | POA: Diagnosis not present

## 2021-02-10 DIAGNOSIS — E871 Hypo-osmolality and hyponatremia: Secondary | ICD-10-CM | POA: Diagnosis not present

## 2021-02-10 DIAGNOSIS — I10 Essential (primary) hypertension: Secondary | ICD-10-CM | POA: Diagnosis not present

## 2021-02-10 DIAGNOSIS — T8130XA Disruption of wound, unspecified, initial encounter: Secondary | ICD-10-CM | POA: Diagnosis not present

## 2021-02-10 DIAGNOSIS — M869 Osteomyelitis, unspecified: Secondary | ICD-10-CM | POA: Diagnosis not present

## 2021-02-10 DIAGNOSIS — I96 Gangrene, not elsewhere classified: Secondary | ICD-10-CM | POA: Diagnosis not present

## 2021-02-10 DIAGNOSIS — E1169 Type 2 diabetes mellitus with other specified complication: Secondary | ICD-10-CM | POA: Diagnosis not present

## 2021-02-10 DIAGNOSIS — E1152 Type 2 diabetes mellitus with diabetic peripheral angiopathy with gangrene: Secondary | ICD-10-CM | POA: Diagnosis not present

## 2021-02-10 DIAGNOSIS — Z20822 Contact with and (suspected) exposure to covid-19: Secondary | ICD-10-CM | POA: Diagnosis not present

## 2021-02-10 DIAGNOSIS — E1165 Type 2 diabetes mellitus with hyperglycemia: Secondary | ICD-10-CM | POA: Diagnosis not present

## 2021-02-10 DIAGNOSIS — I428 Other cardiomyopathies: Secondary | ICD-10-CM | POA: Diagnosis not present

## 2021-02-10 DIAGNOSIS — I130 Hypertensive heart and chronic kidney disease with heart failure and stage 1 through stage 4 chronic kidney disease, or unspecified chronic kidney disease: Secondary | ICD-10-CM | POA: Diagnosis not present

## 2021-02-10 HISTORY — PX: AMPUTATION: SHX166

## 2021-02-10 LAB — BASIC METABOLIC PANEL
Anion gap: 7 (ref 5–15)
BUN: 30 mg/dL — ABNORMAL HIGH (ref 8–23)
CO2: 26 mmol/L (ref 22–32)
Calcium: 8.6 mg/dL — ABNORMAL LOW (ref 8.9–10.3)
Chloride: 98 mmol/L (ref 98–111)
Creatinine, Ser: 1.14 mg/dL (ref 0.61–1.24)
GFR, Estimated: >60 mL/min (ref >60)
Glucose, Bld: 180 mg/dL — ABNORMAL HIGH (ref 70–99)
Potassium: 4.8 mmol/L (ref 3.5–5.1)
Sodium: 131 mmol/L — ABNORMAL LOW (ref 135–145)

## 2021-02-10 LAB — CBC
HCT: 26.4 % — ABNORMAL LOW (ref 39.0–52.0)
Hemoglobin: 8.6 g/dL — ABNORMAL LOW (ref 13.0–17.0)
MCH: 26.4 pg (ref 26.0–34.0)
MCHC: 32.6 g/dL (ref 30.0–36.0)
MCV: 81 fL (ref 80.0–100.0)
Platelets: 460 10*3/uL — ABNORMAL HIGH (ref 150–400)
RBC: 3.26 MIL/uL — ABNORMAL LOW (ref 4.22–5.81)
RDW: 15.9 % — ABNORMAL HIGH (ref 11.5–15.5)
WBC: 12.1 10*3/uL — ABNORMAL HIGH (ref 4.0–10.5)
nRBC: 0 % (ref 0.0–0.2)

## 2021-02-10 LAB — GLUCOSE, CAPILLARY
Glucose-Capillary: 163 mg/dL — ABNORMAL HIGH (ref 70–99)
Glucose-Capillary: 92 mg/dL (ref 70–99)
Glucose-Capillary: 114 mg/dL — ABNORMAL HIGH (ref 70–99)
Glucose-Capillary: 200 mg/dL — ABNORMAL HIGH (ref 70–99)
Glucose-Capillary: 333 mg/dL — ABNORMAL HIGH (ref 70–99)

## 2021-02-10 LAB — HEMOGLOBIN AND HEMATOCRIT, BLOOD
HCT: 33.7 % — ABNORMAL LOW (ref 39.0–52.0)
Hemoglobin: 10.9 g/dL — ABNORMAL LOW (ref 13.0–17.0)

## 2021-02-10 LAB — PREPARE RBC (CROSSMATCH)

## 2021-02-10 LAB — ABO/RH: ABO/RH(D): A POS

## 2021-02-10 SURGERY — AMPUTATION BELOW KNEE
Anesthesia: Choice | Site: Knee | Laterality: Left

## 2021-02-10 SURGERY — AMPUTATION BELOW KNEE
Anesthesia: Regional | Site: Knee | Laterality: Left

## 2021-02-10 MED ORDER — PHENYLEPHRINE 40 MCG/ML (10ML) SYRINGE FOR IV PUSH (FOR BLOOD PRESSURE SUPPORT)
PREFILLED_SYRINGE | INTRAVENOUS | Status: AC
  Filled 2021-02-10: qty 30

## 2021-02-10 MED ORDER — PROPOFOL 10 MG/ML IV BOLUS
INTRAVENOUS | Status: AC
  Filled 2021-02-10: qty 40

## 2021-02-10 MED ORDER — FENTANYL CITRATE (PF) 100 MCG/2ML IJ SOLN
INTRAMUSCULAR | Status: AC
  Administered 2021-02-10: 50 ug via INTRAVENOUS
  Filled 2021-02-10: qty 2

## 2021-02-10 MED ORDER — FENTANYL CITRATE (PF) 100 MCG/2ML IJ SOLN
50.0000 ug | Freq: Once | INTRAMUSCULAR | Status: AC
Start: 2021-02-10 — End: 2021-02-10

## 2021-02-10 MED ORDER — PHENYLEPHRINE 40 MCG/ML (10ML) SYRINGE FOR IV PUSH (FOR BLOOD PRESSURE SUPPORT)
PREFILLED_SYRINGE | INTRAVENOUS | Status: DC | PRN
  Administered 2021-02-10 (×7): 80 ug via INTRAVENOUS

## 2021-02-10 MED ORDER — CHLORHEXIDINE GLUCONATE 0.12 % MT SOLN
OROMUCOSAL | Status: AC
  Administered 2021-02-10: 15 mL via OROMUCOSAL
  Filled 2021-02-10: qty 15

## 2021-02-10 MED ORDER — DEXAMETHASONE SODIUM PHOSPHATE 10 MG/ML IJ SOLN
INTRAMUSCULAR | Status: DC | PRN
  Administered 2021-02-10: 10 mg

## 2021-02-10 MED ORDER — LIDOCAINE 2% (20 MG/ML) 5 ML SYRINGE
INTRAMUSCULAR | Status: AC
  Filled 2021-02-10: qty 5

## 2021-02-10 MED ORDER — MIDAZOLAM HCL 2 MG/2ML IJ SOLN: 1.0000 mg | Freq: Once | INTRAMUSCULAR | Status: AC

## 2021-02-10 MED ORDER — CEFAZOLIN SODIUM-DEXTROSE 2-4 GM/100ML-% IV SOLN
2.0000 g | INTRAVENOUS | Status: AC
  Administered 2021-02-10: 2 g via INTRAVENOUS
  Filled 2021-02-10: qty 100

## 2021-02-10 MED ORDER — SODIUM CHLORIDE 0.9% IV SOLUTION: Freq: Once | INTRAVENOUS | Status: AC

## 2021-02-10 MED ORDER — SUCCINYLCHOLINE CHLORIDE 20 MG/ML IJ SOLN
INTRAMUSCULAR | Status: DC | PRN
  Administered 2021-02-10: 100 mg via INTRAVENOUS

## 2021-02-10 MED ORDER — ROPIVACAINE HCL 5 MG/ML IJ SOLN
INTRAMUSCULAR | Status: DC | PRN
  Administered 2021-02-10: 40 mL via PERINEURAL

## 2021-02-10 MED ORDER — TRANEXAMIC ACID-NACL 1000-0.7 MG/100ML-% IV SOLN
1000.0000 mg | Freq: Once | INTRAVENOUS | Status: AC
  Administered 2021-02-10: 1000 mg via INTRAVENOUS
  Filled 2021-02-10: qty 100

## 2021-02-10 MED ORDER — PROPOFOL 10 MG/ML IV BOLUS
INTRAVENOUS | Status: DC | PRN
  Administered 2021-02-10: 100 mg via INTRAVENOUS
  Administered 2021-02-10: 50 mg via INTRAVENOUS

## 2021-02-10 MED ORDER — LIDOCAINE 2% (20 MG/ML) 5 ML SYRINGE
INTRAMUSCULAR | Status: DC | PRN
  Administered 2021-02-10: 20 mg via INTRAVENOUS

## 2021-02-10 MED ORDER — CHLORHEXIDINE GLUCONATE 0.12 % MT SOLN: 15.0000 mL | Freq: Once | OROMUCOSAL | Status: AC

## 2021-02-10 MED ORDER — SODIUM CHLORIDE 0.9 % IV SOLN: INTRAVENOUS | Status: DC

## 2021-02-10 MED ORDER — PHENYLEPHRINE 40 MCG/ML (10ML) SYRINGE FOR IV PUSH (FOR BLOOD PRESSURE SUPPORT)
PREFILLED_SYRINGE | INTRAVENOUS | Status: AC
  Filled 2021-02-10: qty 10

## 2021-02-10 MED ORDER — MIDAZOLAM HCL 2 MG/2ML IJ SOLN
INTRAMUSCULAR | Status: AC
  Administered 2021-02-10: 1 mg via INTRAVENOUS
  Filled 2021-02-10: qty 2

## 2021-02-10 MED ORDER — LACTATED RINGERS IV SOLN: INTRAVENOUS | Status: DC

## 2021-02-10 MED ORDER — 0.9 % SODIUM CHLORIDE (POUR BTL) OPTIME
TOPICAL | Status: DC | PRN
  Administered 2021-02-10: 1000 mL

## 2021-02-10 MED ORDER — ALBUMIN HUMAN 5 % IV SOLN: INTRAVENOUS | Status: DC | PRN

## 2021-02-10 MED ORDER — ONDANSETRON HCL 4 MG/2ML IJ SOLN
INTRAMUSCULAR | Status: DC | PRN
  Administered 2021-02-10: 4 mg via INTRAVENOUS

## 2021-02-10 MED ORDER — ORAL CARE MOUTH RINSE: 15.0000 mL | Freq: Once | OROMUCOSAL | Status: AC

## 2021-02-10 MED ORDER — PANTOPRAZOLE SODIUM 40 MG PO TBEC
40.0000 mg | DELAYED_RELEASE_TABLET | Freq: Every day | ORAL | Status: DC
  Administered 2021-02-10 – 2021-02-15 (×6): 40 mg via ORAL
  Filled 2021-02-10 (×6): qty 1

## 2021-02-10 SURGICAL SUPPLY — 39 items
BLADE SAW RECIP 87.9 MT (BLADE) ×2 IMPLANT
BLADE SURG 21 STRL SS (BLADE) ×2 IMPLANT
BNDG COHESIVE 6X5 TAN STRL LF (GAUZE/BANDAGES/DRESSINGS) ×2 IMPLANT
CANISTER WOUND CARE 500ML ATS (WOUND CARE) ×2 IMPLANT
COVER SURGICAL LIGHT HANDLE (MISCELLANEOUS) ×2 IMPLANT
CUFF TOURN SGL QUICK 34 (TOURNIQUET CUFF) ×2
CUFF TRNQT CYL 34X4.125X (TOURNIQUET CUFF) ×1 IMPLANT
DRAPE DERMATAC (DRAPES) ×2 IMPLANT
DRAPE INCISE IOBAN 66X45 STRL (DRAPES) ×2 IMPLANT
DRAPE U-SHAPE 47X51 STRL (DRAPES) ×2 IMPLANT
DRESSING PREVENA PLUS CUSTOM (GAUZE/BANDAGES/DRESSINGS) ×1 IMPLANT
DRSG PREVENA PLUS CUSTOM (GAUZE/BANDAGES/DRESSINGS) ×2
DURAPREP 26ML APPLICATOR (WOUND CARE) ×2 IMPLANT
ELECT REM PT RETURN 9FT ADLT (ELECTROSURGICAL) ×2
ELECTRODE REM PT RTRN 9FT ADLT (ELECTROSURGICAL) ×1 IMPLANT
GLOVE BIOGEL PI IND STRL 9 (GLOVE) ×1 IMPLANT
GLOVE BIOGEL PI INDICATOR 9 (GLOVE) ×1
GLOVE SURG ORTHO 9.0 STRL STRW (GLOVE) ×2 IMPLANT
GOWN STRL REUS W/ TWL XL LVL3 (GOWN DISPOSABLE) ×2 IMPLANT
GOWN STRL REUS W/TWL XL LVL3 (GOWN DISPOSABLE) ×4
KIT BASIN OR (CUSTOM PROCEDURE TRAY) ×2 IMPLANT
KIT TURNOVER KIT B (KITS) ×2 IMPLANT
MANIFOLD NEPTUNE II (INSTRUMENTS) ×2 IMPLANT
NS IRRIG 1000ML POUR BTL (IV SOLUTION) ×2 IMPLANT
PACK ORTHO EXTREMITY (CUSTOM PROCEDURE TRAY) ×2 IMPLANT
PAD ARMBOARD 7.5X6 YLW CONV (MISCELLANEOUS) ×2 IMPLANT
PREVENA RESTOR ARTHOFORM 46X30 (CANNISTER) ×2 IMPLANT
PREVENA RESTOR AXIOFORM 29X28 (GAUZE/BANDAGES/DRESSINGS) ×1 IMPLANT
SPONGE LAP 18X18 RF (DISPOSABLE) ×1 IMPLANT
STAPLER VISISTAT 35W (STAPLE) ×1 IMPLANT
STOCKINETTE IMPERVIOUS LG (DRAPES) ×2 IMPLANT
SUT ETHILON 2 0 PSLX (SUTURE) ×4 IMPLANT
SUT SILK 2 0 (SUTURE) ×2
SUT SILK 2-0 18XBRD TIE 12 (SUTURE) ×1 IMPLANT
SUT VIC AB 1 CTX 36 (SUTURE) ×4
SUT VIC AB 1 CTX36XBRD ANBCTR (SUTURE) IMPLANT
TOWEL GREEN STERILE (TOWEL DISPOSABLE) ×2 IMPLANT
TUBE CONNECTING 12X1/4 (SUCTIONS) ×2 IMPLANT
YANKAUER SUCT BULB TIP NO VENT (SUCTIONS) ×2 IMPLANT

## 2021-02-10 NOTE — Transfer of Care (Signed)
Immediate Anesthesia Transfer of Care Note  Patient: Scott Greer  Procedure(s) Performed: AMPUTATION BELOW KNEE (Left Knee)  Patient Location: PACU  Anesthesia Type:General and Regional  Level of Consciousness: drowsy  Airway & Oxygen Therapy: Patient Spontanous Breathing and Patient connected to face mask oxygen  Post-op Assessment: Report given to RN and Post -op Vital signs reviewed and stable  Post vital signs: Reviewed and stable  Last Vitals:  Vitals Value Taken Time  BP 116/74 02/10/21 1238  Temp    Pulse 65 02/10/21 1242  Resp 24 02/10/21 1242  SpO2 100 % 02/10/21 1242  Vitals shown include unvalidated device data.  Last Pain:  Vitals:   02/10/21 1125  TempSrc:   PainSc: 0-No pain      Patients Stated Pain Goal: 3 (02/10/21 1045)  Complications: No complications documented.

## 2021-02-10 NOTE — Anesthesia Procedure Notes (Signed)
Anesthesia Regional Block: Popliteal block   Pre-Anesthetic Checklist: ,, timeout performed, Correct Patient, Correct Site, Correct Laterality, Correct Procedure, Correct Position, site marked, Risks and benefits discussed,  Surgical consent,  Pre-op evaluation,  At surgeon's request and post-op pain management  Laterality: Left  Prep: Maximum Sterile Barrier Precautions used, chloraprep       Needles:  Injection technique: Single-shot  Needle Type: Echogenic Stimulator Needle     Needle Length: 9cm  Needle Gauge: 22     Additional Needles:   Procedures:,,,, ultrasound used (permanent image in chart),,,,  Narrative:  Start time: 02/10/2021 11:20 AM End time: 02/10/2021 11:25 AM Injection made incrementally with aspirations every 5 mL.  Performed by: Personally  Anesthesiologist: Lannie Fields, DO  Additional Notes: Monitors applied. No increased pain on injection. No increased resistance to injection. Injection made in 5cc increments. Good needle visualization. Patient tolerated procedure well.

## 2021-02-10 NOTE — Progress Notes (Signed)
PROGRESS NOTE   Scott Greer  CNO:[id number]    DOB: [birth year]    DOA: 02/06/2021  PCP: [name], MD   I have briefly reviewed patients previous medical records in [facility].  Chief Complaint  Patient presents with  . Foot Pain    Brief Narrative:  65 year old male, lives alone, medical history significant for but not limited to hypertension, hyperlipidemia, type II DM, PAD, s/p right first toe amputation, recent left first and second ray amputations on 01/25/2021 by Dr. Lajoyce Corners, missed follow-up appointments and presented with pain, wound dehiscence and purulent drainage from left first and second ray amputation site.  Admitted for PAD with wound dehiscence of the first and second ray amputation with purulent drainage and dry gangrenous changes to the third toe.  Dr. Lajoyce Corners recommended transtibial amputation, patient declined, agreed and initially underwent transmetatarsal amputation 4/6.  However per Dr. Lajoyce Corners, he had extensive abscess that extended up to the hindfoot, strongly recommended BKA which patient initially declined but subsequently consented and currently in OR for same.   Assessment & Plan:  Principal Problem:   Left foot infection Active Problems:   Essential hypertension   Hyperlipidemia   Noncompliance   Leukocytosis   Thrombocytosis   Hyponatremia   AKI (acute kidney injury) (HCC)   Hyperglycemia due to diabetes mellitus (HCC)   Hypoalbuminemia due to protein-calorie malnutrition (HCC)   Wound dehiscence   Gangrene of toe of left foot (HCC)   Cutaneous abscess of left foot   PAD with wound dehiscence of the recent first and second ray amputation (01/25/2021) with purulent drainage and dry gangrenous changes to the third toe.  Did not meet sepsis criteria on admission  X-ray of left foot showed gas throughout the soft tissues of the foot but no changes of acute osteomyelitis  Continue empirically started IV cefepime and vancomycin.  Blood cultures  x2: Negative to date.  Dr. Lajoyce Corners, orthopedics consulted, recommended transtibial amputation, patient declined, agreed to and underwent left transmetatarsal amputation 4/6.  Therapies evaluation.  Patient states that he lives alone and needs support at home-TOC consulted.  I discussed with Dr. Lajoyce Corners 4/7.  Patient had extensive abscess that extended up to the hindfoot, he was apparently unable to close the wound which was packed open.  He strongly recommends BKA, he discussed this with the patient now and even had done this prior to the TMA but patient had then refused transtibial amputation.    Patient had initially declined left BKA, appears to have uncontrolled pain, Dr. Lajoyce Corners suspects due to ischemia, patient finally agreed to surgery and is in the OR now for left BKA.  Postop, Dr. Lajoyce Corners to recommend duration of IV antibiotics?  24 to 48 hours and then stop.  Anemia  Hemoglobin was in the 12 g range during recent hospitalization, admitted with 11.2.  Likely multifactorial due to hemodilution, acute illness, surgery and chronic disease.  Hemoglobin gradually drifting down, down to 8.6 today.  Follow CBC daily and transfuse if hemoglobin 7 g or less.  Leukocytosis  Most likely secondary to infectious etiology above.  Presented with WBC of 28.5.  Continues to approach normalcy.  Reactive thrombocytosis  Approaching normalcy.  Hyponatremia  Could be related to dehydration.  Sodium 126 on admission, had improved to 130 but back down to 128 on 4/7, unclear reason.  Asymptomatic.    Urine osmolarity: 482, urine sodium 12.  Serum osmolarity 290 on admission.   Briefly hydrated again with IV normal  saline infusion overnight and sodium up to 131, corrected to hyperglycemia may be slightly higher.  Trend daily BMP.  Acute kidney injury  Baseline creatinine reportedly 1.1-1.3.  Presented with creatinine of 1.54.  Resolved after IV fluids.  Type II DM with  hyperglycemia  Reasonable inpatient control on SSI.  Adjust insulins as needed.  Essential hypertension  Controlled mostly with occasional elevations.  Antihypertensives on hold.  Hyperlipidemia  Continue Lipitor.  Chronic systolic CHF  2D echo 12/26/2020: LVEF 25-30% with LV global hypokinesis.  Clinically euvolemic.  Moderate protein calorie malnutrition  Continue nutritional supplementation.  Noncompliance with treatment regimen  Patient missed for surgical follow-up.  Counseled extensively regarding importance of compliance with all aspects of medical care and he verbalized understanding   Body mass index is 24.88 kg/m.     DVT prophylaxis: SCD's Start: 02/08/21 1634 SCDs Start: 02/07/21 0032     Code Status: Full Code Family Communication: Patient declined MDs offer to speak with his family. Disposition:  Status is: Inpatient  Remains inpatient appropriate because:Inpatient level of care appropriate due to severity of illness   Dispo: The patient is from: Home              Anticipated d/c is to: Home              Patient currently is not medically stable to d/c.   Difficult to place patient No        Consultants:   Orthopedics  Procedures:   4/6: Left transmetatarsal amputation by Dr. Lajoyce Corners  Antimicrobials:    Anti-infectives (From admission, onward)   Start     Dose/Rate Route Frequency Ordered Stop   02/10/21 0815  ceFAZolin (ANCEF) IVPB 2g/100 mL premix        2 g 200 mL/hr over 30 Minutes Intravenous To Short Stay 02/10/21 0808 02/11/21 0815   02/08/21 1500  [MAR Hold]  vancomycin (VANCOREADY) IVPB 1000 mg/200 mL        (MAR Hold since Fri 02/10/2021 at 1032.Hold Reason: Transfer to a Procedural area.)   1,000 mg 200 mL/hr over 60 Minutes Intravenous Every 12 hours 02/08/21 0846     02/08/21 0600  ceFAZolin (ANCEF) IVPB 2g/100 mL premix        2 g 200 mL/hr over 30 Minutes Intravenous On call to O.R. 02/07/21 1835 02/08/21 1457   02/07/21  2200  vancomycin (VANCOCIN) 1,750 mg in sodium chloride 0.9 % 500 mL IVPB  Status:  Discontinued        1,750 mg 250 mL/hr over 120 Minutes Intravenous Every 24 hours 02/07/21 0429 02/07/21 0828   02/07/21 2200  vancomycin (VANCOREADY) IVPB 1750 mg/350 mL  Status:  Discontinued        1,750 mg 175 mL/hr over 120 Minutes Intravenous Every 24 hours 02/07/21 0828 02/08/21 0846   02/07/21 1000  ceFEPIme (MAXIPIME) 2 g in sodium chloride 0.9 % 100 mL IVPB  Status:  Discontinued        2 g 200 mL/hr over 30 Minutes Intravenous Every 12 hours 02/07/21 0429 02/07/21 0923   02/07/21 0930  [MAR Hold]  ceFEPIme (MAXIPIME) 2 g in sodium chloride 0.9 % 100 mL IVPB        (MAR Hold since Fri 02/10/2021 at 1032.Hold Reason: Transfer to a Procedural area.)   2 g 200 mL/hr over 30 Minutes Intravenous Every 8 hours 02/07/21 0923     02/06/21 2245  vancomycin (VANCOREADY) IVPB 1500 mg/300 mL  1,500 mg 150 mL/hr over 120 Minutes Intravenous  Once 02/06/21 2239 02/07/21 0231   02/06/21 2245  ceFEPIme (MAXIPIME) 2 g in sodium chloride 0.9 % 100 mL IVPB        2 g 200 mL/hr over 30 Minutes Intravenous  Once 02/06/21 2239 02/06/21 2344        Subjective:  Patient seen this morning prior to procedure.  Moderate left foot pain.  Was getting ready to go for surgery.  Stated that he has consented for surgery due to ongoing pain in the left foot.  Objective:   Vitals:   02/10/21 0856 02/10/21 1115 02/10/21 1120 02/10/21 1125  BP: (!) 140/93 (!) 143/125 (!) 152/79 (!) 144/78  Pulse: 75 66 67 65  Resp: 18 15 14 13   Temp: 98.6 F (37 C)     TempSrc: Oral     SpO2: 100% 100% 100% 100%  Weight:      Height:        General exam: Middle-age male, moderately built and nourished lying comfortably propped up in bed without distress. Respiratory system: Clear to auscultation.  No increased work of breathing. Cardiovascular system: S1 and S2 heard, RRR.  No JVD, murmurs or pedal edema. Gastrointestinal  system: Abdomen is nondistended, soft and nontender. No organomegaly or masses felt. Normal bowel sounds heard. Central nervous system: Alert and oriented. No focal neurological deficits. Extremities: Symmetric 5 x 5 power.  Left foot postop dressing clean and dry.  Right great toe amputation site has healed well. Skin: No rashes, lesions or ulcers Psychiatry: Judgement and insight appear normal. Mood & affect appropriate.     Data Reviewed:   I have personally reviewed following labs and imaging studies   CBC: Recent Labs  Lab 02/06/21 2057 02/07/21 0346 02/08/21 0429 02/09/21 0338 02/10/21 0255  WBC 28.5*   < > 16.1* 14.5* 12.1*  NEUTROABS 24.8*  --   --   --   --   HGB 11.2*   < > 9.8* 9.2* 8.6*  HCT 34.5*   < > 29.8* 28.1* 26.4*  MCV 81.4   < > 79.9* 80.7 81.0  PLT 563*   < > 464* 457* 460*   < > = values in this interval not displayed.    Basic Metabolic Panel: Recent Labs  Lab 02/07/21 0346 02/08/21 0715 02/09/21 0338 02/10/21 0255  NA 129* 130* 128* 131*  K 4.4 4.5 5.0 4.8  CL 92* 94* 96* 98  CO2 27 25 25 26   GLUCOSE 163* 145* 162* 180*  BUN 32* 37* 36* 30*  CREATININE 1.45* 1.27* 1.15 1.14  CALCIUM 8.8* 8.8* 8.4* 8.6*  MG 1.8  --   --   --   PHOS 3.2  --   --   --     Liver Function Tests: Recent Labs  Lab 02/06/21 2057 02/07/21 0346  AST 17 15  ALT 13 11  ALKPHOS 98 88  BILITOT 1.7* 0.8  PROT 7.5 6.7  ALBUMIN 2.5* 2.3*    CBG: Recent Labs  Lab 02/09/21 2052 02/10/21 0844 02/10/21 1038  GLUCAP 159* 163* 92    Microbiology Studies:   Recent Results (from the past 240 hour(s))  Culture, blood (routine x 2)     Status: None (Preliminary result)   Collection Time: 02/06/21  8:58 PM   Specimen: BLOOD RIGHT ARM  Result Value Ref Range Status   Specimen Description BLOOD RIGHT ARM  Final   Special Requests   Final  BOTTLES DRAWN AEROBIC AND ANAEROBIC Blood Culture results may not be optimal due to an excessive volume of blood received  in culture bottles   Culture   Final    NO GROWTH 4 DAYS Performed at [facility], [address] [place], [place] [address]    Report Status PENDING  Incomplete  Culture, blood (routine x 2)     Status: None (Preliminary result)   Collection Time: 02/06/21  8:58 PM   Specimen: BLOOD LEFT ARM  Result Value Ref Range Status   Specimen Description BLOOD LEFT ARM  Final   Special Requests   Final    BOTTLES DRAWN AEROBIC ONLY Blood Culture results may not be optimal due to an excessive volume of blood received in culture bottles   Culture   Final    NO GROWTH 4 DAYS Performed at [facility], [address] [place], [place] [address]    Report Status PENDING  Incomplete  SARS CORONAVIRUS 2 (TAT 6-24 HRS) Nasopharyngeal Nasopharyngeal Swab     Status: None   Collection Time: 02/07/21  1:07 AM   Specimen: Nasopharyngeal Swab  Result Value Ref Range Status   SARS Coronavirus 2 NEGATIVE NEGATIVE Final    Comment: (NOTE) SARS-CoV-2 target nucleic acids are NOT DETECTED.  The SARS-CoV-2 RNA is generally detectable in upper and lower respiratory specimens during the acute phase of infection. Negative results do not preclude SARS-CoV-2 infection, do not rule out co-infections with other pathogens, and should not be used as the sole basis for treatment or other patient management decisions. Negative results must be combined with clinical observations, patient history, and epidemiological information. The expected result is Negative.  Fact Sheet for Patients: HairSlick.nohttps://www.fda.gov/media/138098/download  Fact Sheet for Healthcare Providers: quierodirigir.comhttps://www.fda.gov/media/138095/download  This test is not yet approved or cleared by the [place] FDA and  has been authorized for detection and/or diagnosis of SARS-CoV-2 by FDA under an Emergency Use Authorization (EUA). This EUA will remain  in effect (meaning this test can be used) for the duration of the COVID-19 declaration  under Se ction 564(b)(1) of the Act, 21 U.S.C. section 360bbb-3(b)(1), unless the authorization is terminated or revoked sooner.  Performed at [facility], [address] [place], [place] [address]   Surgical pcr screen     Status: None   Collection Time: 02/07/21  7:02 PM   Specimen: Nasal Mucosa; Nasal Swab  Result Value Ref Range Status   MRSA, PCR NEGATIVE NEGATIVE Final   Staphylococcus aureus NEGATIVE NEGATIVE Final    Comment: (NOTE) The Xpert SA Assay (FDA approved for NASAL specimens in patients 63 years of age and older), is one component of a comprehensive surveillance program. It is not intended to diagnose infection nor to guide or monitor treatment. Performed at [facility], [address] [place], [place] [address]      Radiology Studies:  No results found.   Scheduled Meds:   . [MAR Hold] docusate sodium  100 mg Oral Daily  . [MAR Hold] feeding supplement (GLUCERNA SHAKE)  237 mL Oral TID BM  . [MAR Hold] insulin aspart  0-15 Units Subcutaneous TID WC  . [MAR Hold] insulin aspart  0-5 Units Subcutaneous QHS    Continuous Infusions:   .  ceFAZolin (ANCEF) IV    . [MAR Hold] ceFEPime (MAXIPIME) IV 2 g (02/10/21 0602)  . lactated ringers 10 mL/hr at 02/10/21 1052  . [MAR Hold] vancomycin 1,000 mg (02/10/21 0327)  LOS: 3 days     Marcellus Scott, MD, [place], [facility]. [facility]    To contact the attending provider between 7A-7P or the covering provider during after hours 7P-7A, please log into the web site [email or web address] and access using universal [place] password for that web site. If you do not have the password, please call the hospital operator.  02/10/2021, 11:49 AM

## 2021-02-10 NOTE — Progress Notes (Signed)
Orthopedic Tech Progress Note Patient Details:  Scott Greer 09/01/1956 [id number] Ordered brace Patient ID: [name], male   DOB: [birth year], 65 y.o.   MRN: [id number]   Michelle Piper 02/10/2021, 5:35 PM

## 2021-02-10 NOTE — Progress Notes (Signed)
Patient ID: Scott Greer, male   DOB: 12-22-1955, 65 y.o.   MRN: [id number] Patient states that he is having increasing pain in the left foot.  This is most likely due to increasing ischemic changes as well as the infection.  We again had a long discussion regarding treatment options.  Patient at this time states he would like to proceed with a left below-knee amputation.  Patient is n.p.o. at this time and will plan for surgery today.

## 2021-02-10 NOTE — Anesthesia Procedure Notes (Signed)
Anesthesia Regional Block: Adductor canal block   Pre-Anesthetic Checklist: ,, timeout performed, Correct Patient, Correct Site, Correct Laterality, Correct Procedure, Correct Position, site marked, Risks and benefits discussed,  Surgical consent,  Pre-op evaluation,  At surgeon's request and post-op pain management  Laterality: Left  Prep: Maximum Sterile Barrier Precautions used, chloraprep       Needles:  Injection technique: Single-shot  Needle Type: Echogenic Stimulator Needle     Needle Length: 9cm  Needle Gauge: 22     Additional Needles:   Procedures:,,,, ultrasound used (permanent image in chart),,,,  Narrative:  Start time: 02/10/2021 11:25 AM End time: 02/10/2021 11:30 AM Injection made incrementally with aspirations every 5 mL.  Performed by: Personally  Anesthesiologist: Lannie Fields, DO  Additional Notes: Monitors applied. No increased pain on injection. No increased resistance to injection. Injection made in 5cc increments. Good needle visualization. Patient tolerated procedure well.

## 2021-02-10 NOTE — H&P (View-Only) (Signed)
Patient ID: Scott Greer, male   DOB: 12/10/1955, 64 y.o.   MRN: [id number] Patient states that he is having increasing pain in the left foot.  This is most likely due to increasing ischemic changes as well as the infection.  We again had a long discussion regarding treatment options.  Patient at this time states he would like to proceed with a left below-knee amputation.  Patient is n.p.o. at this time and will plan for surgery today. 

## 2021-02-10 NOTE — Anesthesia Preprocedure Evaluation (Addendum)
Anesthesia Evaluation  Patient identified by MRN, date of birth, ID band Patient awake    Reviewed: Allergy & Precautions, NPO status , Patient's Chart, lab work & pertinent test results  Airway Mallampati: II  TM Distance: >3 FB Neck ROM: Full    Dental no notable dental hx. (+) Poor Dentition, Dental Advisory Given   Pulmonary Current Smoker and Patient abstained from smoking.,  Few cigars/day x many years No inhalers   Pulmonary exam normal breath sounds clear to auscultation       Cardiovascular hypertension, Pt. on medications +CHF (LVEF 25-30%)  Normal cardiovascular exam+ Valvular Problems/Murmurs (mild MR) MR  Rhythm:Regular Rate:Normal  Echo 12/2019 1. Left ventricular ejection fraction, by estimation, is 25 to 30%. The  left ventricle has severely decreased function. The left ventricle  demonstrates global hypokinesis. The left ventricular internal cavity size  was mildly dilated. There is mild left  ventricular hypertrophy. Left ventricular diastolic parameters are  indeterminate.  2. Right ventricular systolic function is severely reduced. The right  ventricular size is mildly enlarged. There is mildly elevated pulmonary  artery systolic pressure.  3. Right atrial size was mildly dilated.  4. The mitral valve is abnormal. Mild mitral valve regurgitation.  5. The aortic valve is grossly normal. Aortic valve regurgitation is not visualized. Mild to moderate aortic valve sclerosis/calcification is present, without any evidence of aortic stenosis.  6. The inferior vena cava is dilated in size with <50% respiratory variability, suggesting right atrial pressure of 15 mmHg.   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  He has severe decrease in cardiac output with a cardiac index of 1.3 L/min/m elevated filling pressures.  He has a nonischemic cardiomyopathy that will need guideline directed  optimal medical therapy.  He may need inotropic therapy in the short-term to optimize his hemodynamics.  The radial sheath was removed and a TR band was placed on the right wrist to achieve patent hemostasis.  The patient left lab in stable condition.   Neuro/Psych negative neurological ROS  negative psych ROS   GI/Hepatic negative GI ROS, (+)     substance abuse (last cocaine few weeks ago)  alcohol use, cocaine use and marijuana use,   Endo/Other  diabetes, Poorly Controlled, Type 2, Oral Hypoglycemic Agentsa1c 9.1 FS 92 in preop  Renal/GU Renal InsufficiencyRenal diseaseCr 1.14  negative genitourinary   Musculoskeletal L foot gangrene   Abdominal   Peds  Hematology  (+) Blood dyscrasia, anemia , 8.6/26.4, plt 460   Anesthesia Other Findings   Reproductive/Obstetrics negative OB ROS                          Anesthesia Physical Anesthesia Plan  ASA: IV  Anesthesia Plan: General and Regional   Post-op Pain Management:  Regional for Post-op pain   Induction:   PONV Risk Score and Plan: 1 and Ondansetron, Dexamethasone, Midazolam and Treatment may vary due to age or medical condition  Airway Management Planned: LMA  Additional Equipment: None  Intra-op Plan:   Post-operative Plan: Extubation in OR  Informed Consent: I have reviewed the patients History and Physical, chart, labs and discussed the procedure including the risks, benefits and alternatives for the proposed anesthesia with the patient or authorized representative who has indicated his/her understanding and acceptance.     Dental advisory given  Plan Discussed with: CRNA  Anesthesia Plan Comments:        Anesthesia Quick Evaluation

## 2021-02-10 NOTE — Plan of Care (Addendum)
Pain not controlled in AM. Pain not controlled by oxy or dilaudid. Paged on-call.   Problem: Education: Goal: Knowledge of General Education information will improve Description: Including pain rating scale, medication(s)/side effects and non-pharmacologic comfort measures Outcome: Progressing   Problem: Activity: Goal: Risk for activity intolerance will decrease Outcome: Progressing   Problem: Pain Managment: Goal: General experience of comfort will improve Outcome: Progressing   Problem: Safety: Goal: Ability to remain free from injury will improve Outcome: Progressing   Problem: Skin Integrity: Goal: Risk for impaired skin integrity will decrease Outcome: Progressing

## 2021-02-10 NOTE — Progress Notes (Signed)
PT Cancellation Note  Patient Details Name: Scott Greer MRN: [id number] DOB: 07-20-1956   Cancelled Treatment:    Reason Eval/Treat Not Completed: Other (comment) (pt having surgery today)  Ginette Otto, DPT Acute Rehabilitation Services [phone]  Lucretia Field 02/10/2021, 9:16 AM

## 2021-02-10 NOTE — Anesthesia Postprocedure Evaluation (Signed)
Anesthesia Post Note  Patient: Scott Greer  Procedure(s) Performed: AMPUTATION BELOW KNEE (Left Knee)     Patient location during evaluation: PACU Anesthesia Type: Regional and General Level of consciousness: awake and alert, oriented and patient cooperative Pain management: pain level controlled Vital Signs Assessment: post-procedure vital signs reviewed and stable Respiratory status: spontaneous breathing, nonlabored ventilation and respiratory function stable Cardiovascular status: blood pressure returned to baseline and stable Postop Assessment: no apparent nausea or vomiting Anesthetic complications: no   No complications documented.  Last Vitals:  Vitals:   02/10/21 1253 02/10/21 1309  BP: 120/73 134/77  Pulse: 62 64  Resp: (!) 24 (!) 25  Temp:    SpO2: 100% 98%    Last Pain:  Vitals:   02/10/21 1309  TempSrc:   PainSc: 0-No pain                 Lannie Fields

## 2021-02-10 NOTE — Progress Notes (Incomplete)
OT Cancellation Note  Patient Details Name: Scott Greer MRN: [id number] DOB: Aug 21, 1956   Cancelled Treatment:    Reason Eval/Treat Not Completed:  (Pt having L BKA sx today per Dr. Lajoyce Corners)  Lelon Mast A Droese 02/10/2021, 9:02 AM

## 2021-02-10 NOTE — Anesthesia Procedure Notes (Signed)
Procedure Name: Intubation Date/Time: 02/10/2021 11:55 AM Performed by: Gwyndolyn Saxon, CRNA Pre-anesthesia Checklist: Patient identified, Emergency Drugs available, Suction available and Patient being monitored Patient Re-evaluated:Patient Re-evaluated prior to induction Oxygen Delivery Method: Circle System Utilized Preoxygenation: Pre-oxygenation with 100% oxygen Induction Type: IV induction Ventilation: Mask ventilation without difficulty Laryngoscope Size: Mac and 4 Grade View: Grade II Tube type: Oral Tube size: 7.5 mm Number of attempts: 1 Airway Equipment and Method: Stylet and Oral airway Placement Confirmation: ETT inserted through vocal cords under direct vision,  positive ETCO2 and breath sounds checked- equal and bilateral Secured at: 27 cm Tube secured with: Tape Dental Injury: Teeth and Oropharynx as per pre-operative assessment

## 2021-02-10 NOTE — Interval H&P Note (Signed)
History and Physical Interval Note:  02/10/2021 11:23 AM  Scott Greer  has presented today for surgery, with the diagnosis of GANGRENE LEFT FOOT.  The various methods of treatment have been discussed with the patient and family. After consideration of risks, benefits and other options for treatment, the patient has consented to  Procedure(s): AMPUTATION BELOW KNEE (Left) as a surgical intervention.  The patient's history has been reviewed, patient examined, no change in status, stable for surgery.  I have reviewed the patient's chart and labs.  Questions were answered to the patient's satisfaction.     [name]

## 2021-02-10 NOTE — Op Note (Signed)
   Date of Surgery: 02/10/2021  INDICATIONS: Scott Greer is a 65 y.o.-year-old male who has undergone foot salvage intervention for the left foot.  Despite foot salvage intervention options patient had a large abscess that extended to the hindfoot with necrotic skin tissue involving the entire plantar aspect of the foot.  With the progressive abscess and skin necrosis patient presents at this time for transtibial amputation.  Risk and benefits were discussed including risk of the wound not healing.  Patient states he understands wished to proceed at this time.Marland Kitchen  PREOPERATIVE DIAGNOSIS: Abscess osteomyelitis left foot  POSTOPERATIVE DIAGNOSIS: Same.  PROCEDURE: Transtibial amputation Application of Prevena wound VAC  SURGEON: Lajoyce Corners, M.D.  ANESTHESIA:  general  IV FLUIDS AND URINE: See anesthesia records.  ESTIMATED BLOOD LOSS: See anesthesia records.  COMPLICATIONS: None.  DESCRIPTION OF PROCEDURE: The patient was brought to the operating room after undergoing regional anesthetic. After adequate levels of anesthesia were obtained patient's lower extremity was prepped using DuraPrep draped into a sterile field. A timeout was called. The foot was draped out of the sterile field with impervious stockinette. A transverse incision was made 11 cm distal to the tibial tubercle. This curved proximally and a large posterior flap was created. The tibia was transected 1 cm proximal to the skin incision. The fibula was transected just proximal to the tibial incision. The tibia was beveled anteriorly. A large posterior flap was created. The sciatic nerve was pulled cut and allowed to retract. The vascular bundles were suture ligated with 2-0 silk. The deep and superficial fascial layers were closed using #1 Vicryl. The skin was closed using staples and 2-0 nylon. The wound was covered with a Prevena customizable and arthroform wound VAC.  The dressing was sealed with dermatac there was a good suction fit. A  prosthetic shrinker will be applied in patient's room. Patient was taken to the PACU in stable condition.   DISCHARGE PLANNING:  Antibiotic duration: 24 hours  Weightbearing: Nonweightbearing on the operative extremity  Pain medication: Opioid pathway  Dressing care/ Wound VAC: Continue wound VAC for 1 week after discharge  Discharge to: Discharge planning based on therapy's recommendations for possible inpatient rehabilitation, outpatient rehabilitation, or discharge to home with therapy  Follow-up: In the office 1 week post operative.  Aldean Baker, MD [facility] 1:01 PM

## 2021-02-11 ENCOUNTER — Encounter (HOSPITAL_COMMUNITY): Payer: Self-pay | Admitting: Orthopedic Surgery

## 2021-02-11 DIAGNOSIS — D72829 Elevated white blood cell count, unspecified: Secondary | ICD-10-CM | POA: Diagnosis not present

## 2021-02-11 DIAGNOSIS — E875 Hyperkalemia: Secondary | ICD-10-CM | POA: Diagnosis not present

## 2021-02-11 DIAGNOSIS — N179 Acute kidney failure, unspecified: Secondary | ICD-10-CM | POA: Diagnosis not present

## 2021-02-11 DIAGNOSIS — I10 Essential (primary) hypertension: Secondary | ICD-10-CM | POA: Diagnosis not present

## 2021-02-11 DIAGNOSIS — L089 Local infection of the skin and subcutaneous tissue, unspecified: Secondary | ICD-10-CM | POA: Diagnosis not present

## 2021-02-11 DIAGNOSIS — E1165 Type 2 diabetes mellitus with hyperglycemia: Secondary | ICD-10-CM | POA: Diagnosis not present

## 2021-02-11 DIAGNOSIS — E871 Hypo-osmolality and hyponatremia: Secondary | ICD-10-CM | POA: Diagnosis not present

## 2021-02-11 LAB — BASIC METABOLIC PANEL
Anion gap: 8 (ref 5–15)
Anion gap: 6 (ref 5–15)
Anion gap: 5 (ref 5–15)
BUN: 31 mg/dL — ABNORMAL HIGH (ref 8–23)
BUN: 30 mg/dL — ABNORMAL HIGH (ref 8–23)
BUN: 27 mg/dL — ABNORMAL HIGH (ref 8–23)
CO2: 25 mmol/L (ref 22–32)
CO2: 24 mmol/L (ref 22–32)
CO2: 24 mmol/L (ref 22–32)
Calcium: 8.9 mg/dL (ref 8.9–10.3)
Calcium: 8.8 mg/dL — ABNORMAL LOW (ref 8.9–10.3)
Calcium: 8.7 mg/dL — ABNORMAL LOW (ref 8.9–10.3)
Chloride: 99 mmol/L (ref 98–111)
Chloride: 101 mmol/L (ref 98–111)
Chloride: 102 mmol/L (ref 98–111)
Creatinine, Ser: 1.18 mg/dL (ref 0.61–1.24)
Creatinine, Ser: 1.14 mg/dL (ref 0.61–1.24)
Creatinine, Ser: 1.03 mg/dL (ref 0.61–1.24)
GFR, Estimated: >60 mL/min (ref >60)
GFR, Estimated: >60 mL/min (ref >60)
GFR, Estimated: >60 mL/min (ref >60)
Glucose, Bld: 264 mg/dL — ABNORMAL HIGH (ref 70–99)
Glucose, Bld: 276 mg/dL — ABNORMAL HIGH (ref 70–99)
Glucose, Bld: 141 mg/dL — ABNORMAL HIGH (ref 70–99)
Potassium: 5.7 mmol/L — ABNORMAL HIGH (ref 3.5–5.1)
Potassium: 5.4 mmol/L — ABNORMAL HIGH (ref 3.5–5.1)
Potassium: 4.8 mmol/L (ref 3.5–5.1)
Sodium: 132 mmol/L — ABNORMAL LOW (ref 135–145)
Sodium: 131 mmol/L — ABNORMAL LOW (ref 135–145)
Sodium: 131 mmol/L — ABNORMAL LOW (ref 135–145)

## 2021-02-11 LAB — CULTURE, BLOOD (ROUTINE X 2)
Culture: NO GROWTH
Culture: NO GROWTH

## 2021-02-11 LAB — CBC
HCT: 29.8 % — ABNORMAL LOW (ref 39.0–52.0)
Hemoglobin: 9.7 g/dL — ABNORMAL LOW (ref 13.0–17.0)
MCH: 26.5 pg (ref 26.0–34.0)
MCHC: 32.6 g/dL (ref 30.0–36.0)
MCV: 81.4 fL (ref 80.0–100.0)
Platelets: 436 10*3/uL — ABNORMAL HIGH (ref 150–400)
RBC: 3.66 MIL/uL — ABNORMAL LOW (ref 4.22–5.81)
RDW: 15.5 % (ref 11.5–15.5)
WBC: 18.9 10*3/uL — ABNORMAL HIGH (ref 4.0–10.5)
nRBC: 0 % (ref 0.0–0.2)

## 2021-02-11 LAB — BPAM RBC
Blood Product Expiration Date: 202205052359
ISSUE DATE / TIME: 202204081447
Unit Type and Rh: 6200

## 2021-02-11 LAB — TYPE AND SCREEN
ABO/RH(D): A POS
Antibody Screen: NEGATIVE
Unit division: 0

## 2021-02-11 LAB — GLUCOSE, CAPILLARY
Glucose-Capillary: 258 mg/dL — ABNORMAL HIGH (ref 70–99)
Glucose-Capillary: 113 mg/dL — ABNORMAL HIGH (ref 70–99)
Glucose-Capillary: 116 mg/dL — ABNORMAL HIGH (ref 70–99)
Glucose-Capillary: 142 mg/dL — ABNORMAL HIGH (ref 70–99)

## 2021-02-11 MED ORDER — GABAPENTIN 300 MG PO CAPS
300.0000 mg | ORAL_CAPSULE | Freq: Three times a day (TID) | ORAL | Status: DC
  Administered 2021-02-11 – 2021-02-15 (×13): 300 mg via ORAL
  Filled 2021-02-11 (×13): qty 1

## 2021-02-11 MED ORDER — KETOROLAC TROMETHAMINE 15 MG/ML IJ SOLN
7.5000 mg | Freq: Once | INTRAMUSCULAR | Status: AC
  Administered 2021-02-11: 7.5 mg via INTRAVENOUS
  Filled 2021-02-11: qty 1

## 2021-02-11 MED ORDER — ATORVASTATIN CALCIUM 10 MG PO TABS
10.0000 mg | ORAL_TABLET | Freq: Every day | ORAL | Status: DC
  Administered 2021-02-11 – 2021-02-15 (×5): 10 mg via ORAL
  Filled 2021-02-11 (×5): qty 1

## 2021-02-11 MED ORDER — SODIUM ZIRCONIUM CYCLOSILICATE 5 G PO PACK
5.0000 g | PACK | Freq: Once | ORAL | Status: AC
  Administered 2021-02-11: 5 g via ORAL
  Filled 2021-02-11: qty 1

## 2021-02-11 NOTE — Progress Notes (Signed)
PROGRESS NOTE   HAPPY [place]  VOH:[id number]    DOB: [birth year]    DOA: 02/06/2021  PCP: [name], MD   I have briefly reviewed patients previous medical records in [facility].  Chief Complaint  Patient presents with  . Foot Pain    Brief Narrative:  65 year old male, lives alone, medical history significant for but not limited to hypertension, hyperlipidemia, type II DM, PAD, s/p right first toe amputation, recent left first and second ray amputations on 01/25/2021 by Dr. Lajoyce Corners, missed follow-up appointments and presented with pain, wound dehiscence and purulent drainage from left first and second ray amputation site.  Admitted for PAD with wound dehiscence of the first and second ray amputation with purulent drainage and dry gangrenous changes to the third toe.  Dr. Lajoyce Corners recommended transtibial amputation, patient declined, agreed and initially underwent transmetatarsal amputation 4/6.  However per Dr. Lajoyce Corners, he had extensive abscess that extended up to the hindfoot, strongly recommended BKA which patient initially declined but subsequently consented and underwent left transtibial amputation with application of wound VAC 4/8.   Assessment & Plan:  Principal Problem:   Left foot infection Active Problems:   Essential hypertension   Hyperlipidemia   Noncompliance   Leukocytosis   Thrombocytosis   Hyponatremia   AKI (acute kidney injury) (HCC)   Hyperglycemia due to diabetes mellitus (HCC)   Hypoalbuminemia due to protein-calorie malnutrition (HCC)   Wound dehiscence   Gangrene of toe of left foot (HCC)   Cutaneous abscess of left foot   PAD with wound dehiscence of the recent first and second ray amputation (01/25/2021) with purulent drainage and dry gangrenous changes to the third toe.  Did not meet sepsis criteria on admission  X-ray of left foot showed gas throughout the soft tissues of the foot but no changes of acute osteomyelitis  Continue empirically  started IV cefepime and vancomycin.  Blood cultures x2: Negative to date.  Dr. Lajoyce Corners, orthopedics consulted, recommended transtibial amputation, patient declined, agreed to and underwent left transmetatarsal amputation 4/6.  Therapies evaluation.  Patient states that he lives alone and needs support at home-TOC consulted.  I discussed with Dr. Lajoyce Corners 4/7.  Patient had extensive abscess that extended up to the hindfoot, he was apparently unable to close the wound which was packed open.  He strongly recommends BKA, he discussed this with the patient now and even had done this prior to the TMA but patient had then refused transtibial amputation.    Patient had initially declined left BKA, appears to have uncontrolled pain, Dr. Lajoyce Corners suspects due to ischemia, patient finally agreed to surgery and he underwent left transtibial amputation with application of wound VAC 4/8.  Pain control and mobilization per therapies.  Nonweightbearing on left lower extremity.  Per Dr. Lajoyce Corners, DC antibiotics 24 hours postop.  Anemia  Hemoglobin was in the 12 g range during recent hospitalization, admitted with 11.2.  Likely multifactorial due to hemodilution, acute illness, surgery and chronic disease.  Hemoglobin went up from 8.6-10.9 postop, not sure if he received blood transfusion intraoperatively.  Hemoglobin 9.7 today.  Follow CBC daily and transfuse if hemoglobin 7 g or less.  Leukocytosis  WBC up to 19, likely reactive now.  Presented with WBC of 28.5.  Follow daily CBC  Reactive thrombocytosis  Approaching normalcy.  Hyponatremia  Could be related to dehydration.  Sodium 126 on admission  Urine osmolarity: 482, urine sodium 12.  Serum osmolarity 290 on admission.   Has been  fluctuating but stable in the low 130s over the last 2 days.  Trend daily BMP.  Acute kidney injury  Baseline creatinine reportedly 1.1-1.3.  Presented with creatinine of 1.54.  Resolved after IV  fluids.  Hyperkalemia, mild  Potassium 5.4.  Verified after earlier sample was hemolyzed.  Unclear etiology.  Give a dose of Lokelma and follow BMP later today.  Type II DM with hyperglycemia  Had reasonable inpatient control on SSI except 4/8 afternoon when had some elevations, possibly due to surgical stress and no insulins.  CBG better late morning today/113.  Continue to monitor CBGs closely and adjust insulins as needed  Essential hypertension  Controlled mostly with occasional elevations.  Antihypertensives on hold.  Hyperlipidemia  Continue Lipitor.  Chronic systolic CHF  2D echo 12/26/2020: LVEF 25-30% with LV global hypokinesis.  Clinically euvolemic.  Moderate protein calorie malnutrition  Continue nutritional supplementation.  Noncompliance with treatment regimen  Patient missed for surgical follow-up.  Counseled extensively regarding importance of compliance with all aspects of medical care and he verbalized understanding   Body mass index is 24.88 kg/m.     DVT prophylaxis: SCD's Start: 02/10/21 1543 SCD's Start: 02/08/21 1634 SCDs Start: 02/07/21 0032     Code Status: Full Code Family Communication: Patient declined MDs offer to speak with his family. Disposition:  Status is: Inpatient  Remains inpatient appropriate because:Inpatient level of care appropriate due to severity of illness   Dispo: The patient is from: Home              Anticipated d/c is to: Home.  Appropriate for SNF but patient adamantly refusing.  We will continue to encourage same.              Patient currently is not medically stable to d/c.   Difficult to place patient No        Consultants:   Orthopedics  Procedures:   4/6: Left transmetatarsal amputation by Dr. Lajoyce Corners 4/8: Left transtibial amputation by Dr. Lajoyce Corners  Antimicrobials:    Anti-infectives (From admission, onward)   Start     Dose/Rate Route Frequency Ordered Stop   02/10/21 0815  ceFAZolin (ANCEF)  IVPB 2g/100 mL premix        2 g 200 mL/hr over 30 Minutes Intravenous To Short Stay 02/10/21 0808 02/10/21 1200   02/08/21 1500  vancomycin (VANCOREADY) IVPB 1000 mg/200 mL        1,000 mg 200 mL/hr over 60 Minutes Intravenous Every 12 hours 02/08/21 0846     02/08/21 0600  ceFAZolin (ANCEF) IVPB 2g/100 mL premix        2 g 200 mL/hr over 30 Minutes Intravenous On call to O.R. 02/07/21 1835 02/08/21 1457   02/07/21 2200  vancomycin (VANCOCIN) 1,750 mg in sodium chloride 0.9 % 500 mL IVPB  Status:  Discontinued        1,750 mg 250 mL/hr over 120 Minutes Intravenous Every 24 hours 02/07/21 0429 02/07/21 0828   02/07/21 2200  vancomycin (VANCOREADY) IVPB 1750 mg/350 mL  Status:  Discontinued        1,750 mg 175 mL/hr over 120 Minutes Intravenous Every 24 hours 02/07/21 0828 02/08/21 0846   02/07/21 1000  ceFEPIme (MAXIPIME) 2 g in sodium chloride 0.9 % 100 mL IVPB  Status:  Discontinued        2 g 200 mL/hr over 30 Minutes Intravenous Every 12 hours 02/07/21 0429 02/07/21 0923   02/07/21 0930  ceFEPIme (MAXIPIME) 2 g in sodium chloride  0.9 % 100 mL IVPB        2 g 200 mL/hr over 30 Minutes Intravenous Every 8 hours 02/07/21 0923     02/06/21 2245  vancomycin (VANCOREADY) IVPB 1500 mg/300 mL        1,500 mg 150 mL/hr over 120 Minutes Intravenous  Once 02/06/21 2239 02/07/21 0231   02/06/21 2245  ceFEPIme (MAXIPIME) 2 g in sodium chloride 0.9 % 100 mL IVPB        2 g 200 mL/hr over 30 Minutes Intravenous  Once 02/06/21 2239 02/06/21 2344        Subjective:  Reported left lower extremity stump pain was worse this morning but seems better after medication adjustments by Dr. Lajoyce Cornersuda.  Appeared a little confused at times and wants to go home soon.  Objective:   Vitals:   02/10/21 2020 02/11/21 0000 02/11/21 0400 02/11/21 0800  BP: 116/75 (!) 168/89 (!) 155/87 (!) 158/101  Pulse: 85 89 90 98  Resp: 16 17 16 16   Temp: 98.3 F (36.8 C) 97.9 F (36.6 C) 98 F (36.7 C) 97.7 F (36.5 C)   TempSrc: Oral Oral Oral Oral  SpO2: 100% 100% 100% 100%  Weight:      Height:        General exam: Middle-age male, moderately built and nourished sitting up at edge of bed comfortably. Respiratory system: Clear to auscultation.  No increased work of breathing. Cardiovascular system: S1 and S2 heard, RRR.  No JVD, murmurs or pedal edema. Gastrointestinal system: Abdomen is nondistended, soft and nontender. No organomegaly or masses felt. Normal bowel sounds heard. Central nervous system: Alert and oriented.. No focal neurological deficits. Extremities: Symmetric 5 x 5 power.  Left transtibial amputation in stump shrinker and wound VAC. Skin: No rashes, lesions or ulcers Psychiatry: Judgement and insight appear normal. Mood & affect appropriate.     Data Reviewed:   I have personally reviewed following labs and imaging studies   CBC: Recent Labs  Lab 02/06/21 2057 02/07/21 0346 02/09/21 0338 02/10/21 0255 02/10/21 1939 02/11/21 0118  WBC 28.5*   < > 14.5* 12.1*  --  18.9*  NEUTROABS 24.8*  --   --   --   --   --   HGB 11.2*   < > 9.2* 8.6* 10.9* 9.7*  HCT 34.5*   < > 28.1* 26.4* 33.7* 29.8*  MCV 81.4   < > 80.7 81.0  --  81.4  PLT 563*   < > 457* 460*  --  436*   < > = values in this interval not displayed.    Basic Metabolic Panel: Recent Labs  Lab 02/07/21 0346 02/08/21 0715 02/10/21 0255 02/11/21 0118 02/11/21 0622  NA 129*   < > 131* 132* 131*  K 4.4   < > 4.8 5.7* 5.4*  CL 92*   < > 98 99 101  CO2 27   < > 26 25 24   GLUCOSE 163*   < > 180* 264* 276*  BUN 32*   < > 30* 31* 30*  CREATININE 1.45*   < > 1.14 1.18 1.14  CALCIUM 8.8*   < > 8.6* 8.9 8.8*  MG 1.8  --   --   --   --   PHOS 3.2  --   --   --   --    < > = values in this interval not displayed.    Liver Function Tests: Recent Labs  Lab 02/06/21 2057 02/07/21 0346  AST 17 15  ALT 13 11  ALKPHOS 98 88  BILITOT 1.7* 0.8  PROT 7.5 6.7  ALBUMIN 2.5* 2.3*    CBG: Recent Labs  Lab  02/10/21 2023 02/11/21 0635 02/11/21 1143  GLUCAP 333* 258* 113*    Microbiology Studies:   Recent Results (from the past 240 hour(s))  Culture, blood (routine x 2)     Status: None   Collection Time: 02/06/21  8:58 PM   Specimen: BLOOD RIGHT ARM  Result Value Ref Range Status   Specimen Description BLOOD RIGHT ARM  Final   Special Requests   Final    BOTTLES DRAWN AEROBIC AND ANAEROBIC Blood Culture results may not be optimal due to an excessive volume of blood received in culture bottles   Culture   Final    NO GROWTH 5 DAYS Performed at [facility], [address] [place], [place] [address]    Report Status 02/11/2021 FINAL  Final  Culture, blood (routine x 2)     Status: None   Collection Time: 02/06/21  8:58 PM   Specimen: BLOOD LEFT ARM  Result Value Ref Range Status   Specimen Description BLOOD LEFT ARM  Final   Special Requests   Final    BOTTLES DRAWN AEROBIC ONLY Blood Culture results may not be optimal due to an excessive volume of blood received in culture bottles   Culture   Final    NO GROWTH 5 DAYS Performed at [facility], [address] [place], [place] [address]    Report Status 02/11/2021 FINAL  Final  SARS CORONAVIRUS 2 (TAT 6-24 HRS) Nasopharyngeal Nasopharyngeal Swab     Status: None   Collection Time: 02/07/21  1:07 AM   Specimen: Nasopharyngeal Swab  Result Value Ref Range Status   SARS Coronavirus 2 NEGATIVE NEGATIVE Final    Comment: (NOTE) SARS-CoV-2 target nucleic acids are NOT DETECTED.  The SARS-CoV-2 RNA is generally detectable in upper and lower respiratory specimens during the acute phase of infection. Negative results do not preclude SARS-CoV-2 infection, do not rule out co-infections with other pathogens, and should not be used as the sole basis for treatment or other patient management decisions. Negative results must be combined with clinical observations, patient history, and epidemiological information. The  expected result is Negative.  Fact Sheet for Patients: HairSlick.no  Fact Sheet for Healthcare Providers: quierodirigir.com  This test is not yet approved or cleared by the [place] FDA and  has been authorized for detection and/or diagnosis of SARS-CoV-2 by FDA under an Emergency Use Authorization (EUA). This EUA will remain  in effect (meaning this test can be used) for the duration of the COVID-19 declaration under Se ction 564(b)(1) of the Act, 21 U.S.C. section 360bbb-3(b)(1), unless the authorization is terminated or revoked sooner.  Performed at [facility], [address] [place], [place] [address]   Surgical pcr screen     Status: None   Collection Time: 02/07/21  7:02 PM   Specimen: Nasal Mucosa; Nasal Swab  Result Value Ref Range Status   MRSA, PCR NEGATIVE NEGATIVE Final   Staphylococcus aureus NEGATIVE NEGATIVE Final    Comment: (NOTE) The Xpert SA Assay (FDA approved for NASAL specimens in patients 63 years of age and older), is one component of a comprehensive surveillance program. It is not intended to diagnose infection nor to guide or monitor treatment. Performed at [facility], [address] [place], [place] [address]  Radiology Studies:  No results found.   Scheduled Meds:   . docusate sodium  100 mg Oral Daily  . feeding supplement (GLUCERNA SHAKE)  237 mL Oral TID BM  . gabapentin  300 mg Oral TID  . insulin aspart  0-15 Units Subcutaneous TID WC  . insulin aspart  0-5 Units Subcutaneous QHS  . pantoprazole  40 mg Oral Daily    Continuous Infusions:   . sodium chloride 75 mL/hr at 02/10/21 1729  . ceFEPime (MAXIPIME) IV 2 g (02/11/21 1252)  . vancomycin 1,000 mg (02/11/21 0602)     LOS: 4 days     Marcellus Scott, MD, [place], [facility]. [facility]    To contact the attending provider between 7A-7P or the covering provider during after hours 7P-7A,  please log into the web site [email or web address] and access using universal [place] password for that web site. If you do not have the password, please call the hospital operator.  02/11/2021, 2:46 PM

## 2021-02-11 NOTE — Progress Notes (Signed)
Pharmacy Antibiotic Note  Scott Greer is a 65 y.o. male admitted on 02/06/2021 with L foot infection s/p 2nd toe amputation on 01/25/2021.  Pharmacy has been consulted for vancomycin and cefepime dosing.   Patient s/p L transtibial amputation 4/8. Remains afebrile. WBC was improving, now up to 18. Will continue same antibiotic dose. Continuing discussion with primary and ortho for antibiotic length of therapy.  Plan: - Continue vancomycin 1000 mg IV every 12 hours (SCr 1.14, eAUC 433) - Continue cefepime 2gm IV Q8H - Monitor renal fxn, clinical progress, vanc levels as indicated  Height: 6\' 3"  (190.5 cm) Weight: 90.3 kg (199 lb 1.2 oz) IBW/kg (Calculated) : 84.5  Temp (24hrs), Avg:97.8 F (36.6 C), Min:97.5 F (36.4 C), Max:98.3 F (36.8 C)  Recent Labs  Lab 02/06/21 2057 02/06/21 2245 02/07/21 0346 02/08/21 0429 02/08/21 0715 02/09/21 0338 02/10/21 0255 02/11/21 0118 02/11/21 0622  WBC 28.5*  --  25.2* 16.1*  --  14.5* 12.1* 18.9*  --   CREATININE 1.54*  --  1.45*  --  1.27* 1.15 1.14 1.18 1.14  LATICACIDVEN 1.9 1.6  --   --   --   --   --   --   --     Estimated Creatinine Clearance: 80.3 mL/min (by C-G formula based on SCr of 1.14 mg/dL).    Allergies  Allergen Reactions  . Bee Venom Hives, Itching and Swelling    Vanc 4/5 >>  Cefepime 4/4 >>   4/4 BCx - NG <4 days 4/5 surgical PCR - negative      Francisca Langenderfer L. 04/13/21, PharmD, MBA [facility] PGY2 Pharmacy Resident Weekends 7:00 am - 3:00 pm, please call [phone] 02/11/21     11:00 AM  Please check AMION for all [facility] Pharmacy phone numbers After 10:00 PM, call the Main Pharmacy [phone]

## 2021-02-11 NOTE — Progress Notes (Signed)
Patient ID: Scott Greer, male   DOB: 10/28/1956, 65 y.o.   MRN: [id number] Patient is postoperative day 1 left below the knee amputation.  Patient states that he just started getting feeling back into his leg and it is painful.  Order written for Neurontin 300 mg 3 times a day the wound VAC has a good suction fit at this time without drainage however patient has been pulling off the dressings and this may soon start leaking.  Reinforced the importance of not pulling off the dressing.  Patient is already dislodged his IV.

## 2021-02-11 NOTE — Progress Notes (Signed)
Physical Therapy Re-Evaluation Patient Details Name: Scott Greer MRN: [id number] DOB: June 22, 1956 Today's Date: 02/11/2021   History of Present Illness  65 y.o. male admitted 4/4 with left foot pain from the wound site.  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 Kirstie Mirza noted that the wound opened again and due to ongoing pain in the foot, he decided to go to the ED for further evaluation. Past medical history significant for hypertension, hyperlipidemia, T2DM and recent left second toe amputation. Despite foot salvage intervention options patient had a large abscess. 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  Clinical Impression  Patient required guarding and assistance for all mobility. He has some help at home but it does not seem tro be consistent. He requires cuing and guarding for safety. He appears to have a decreased awareness of deficits with his left leg. He would benefit most from rehab at a SNF but appears to be adamantly refusing at this time. Acute PT will continue to follow to improve safety awareness and general mobility.     Follow Up Recommendations SNF;Home health PT (SNV v. HH)    Equipment Recommendations  Rolling walker with 5" wheels;Wheelchair (measurements PT);Wheelchair cushion (measurements PT);3in1 (PT)    Recommendations for Other Services Rehab consult     Precautions / Restrictions Precautions Precautions: Fall Required Braces or Orthoses: Other Brace Other Brace: L LE leg protection splint applied Restrictions Weight Bearing Restrictions: Yes LLE Weight Bearing: Non weight bearing      Mobility  Bed Mobility Overal bed mobility: Needs Assistance Bed Mobility: Supine to Sit     Supine to sit: Supervision;HOB elevated Sit to supine: Min guard   General bed mobility comments: set up to improve safety. cueing for NWB    Transfers Overall transfer level: Needs assistance Equipment used: Rolling walker (2  wheeled) Transfers: Sit to/from [employer] Sit to Stand: Min assist Stand pivot transfers: Min assist       General transfer comment: Min a for safety and stability  Ambulation/Gait Ambulation/Gait assistance: Min guard Gait Distance (Feet): 5 Feet Assistive device: Rolling walker (2 wheeled) Gait Pattern/deviations: Step-to pattern Gait velocity: decreased Gait velocity interpretation: <1.31 ft/sec, indicative of household ambulator General Gait Details: Able to hop 5' with gaurding for safety and stability  Stairs            Wheelchair Mobility    Modified Rankin (Stroke Patients Only)       Balance Overall balance assessment: Needs assistance Sitting-balance support: Bilateral upper extremity supported Sitting balance-Leahy Scale: Fair     Standing balance support: Bilateral upper extremity supported Standing balance-Leahy Scale: Poor                               Pertinent Vitals/Pain Pain Assessment: Faces Pain Score: 7  Faces Pain Scale: Hurts little more Pain Location: left residual limb Pain Descriptors / Indicators: Aching;Grimacing;Guarding Pain Intervention(s): Limited activity within patient's tolerance    Home Living Family/patient expects to be discharged to:: Private residence Living Arrangements: Alone Available Help at Discharge: Neighbor Type of Home: Apartment Home Access: Other (comment)     Home Layout: One level Home Equipment: Cane - single point;Crutches Additional Comments: Has a wife but they dont live together    Prior Function Level of Independence: Independent;Independent with assistive device(s)         Comments: endorses struggling with  managing IADLs, mobility, toilet trasnfers, and LB ADLs. "I'm in the bed most of the day." ( per previous eval)     Hand Dominance   Dominant Hand: Right    Extremity/Trunk Assessment   Upper Extremity Assessment Upper Extremity Assessment: Defer  to OT evaluation    Lower Extremity Assessment Lower Extremity Assessment: Generalized weakness;LLE deficits/detail LLE Deficits / Details: can move left resiudal limb with pain    Cervical / Trunk Assessment Cervical / Trunk Assessment: Normal  Communication   Communication: No difficulties  Cognition Arousal/Alertness: Awake/alert Behavior During Therapy: Flat affect;Anxious;Impulsive Overall Cognitive Status: No family/caregiver present to determine baseline cognitive functioning                                 General Comments: Patient requires cuing to stay on tasks and frequnt cuing for safety      General Comments General comments (skin integrity, edema, etc.): wound vac in place, with shrinker and limb protector donned just prior to session    Exercises     Assessment/Plan    PT Assessment Patient needs continued PT services  PT Problem List Decreased strength;Decreased mobility;Decreased safety awareness;Decreased coordination;Decreased activity tolerance;Decreased knowledge of use of DME       PT Treatment Interventions Therapeutic exercise;Wheelchair mobility training;DME instruction;Gait training;Balance training;Functional mobility training;Therapeutic activities;Patient/family education    PT Goals (Current goals can be found in the Care Plan section)  Acute Rehab PT Goals Patient Stated Goal: to go home today PT Goal Formulation: With patient Time For Goal Achievement: 02/25/21 Potential to Achieve Goals: Good    Frequency Min 3X/week   Barriers to discharge Decreased caregiver support does not seem to have consitnet assist at home    Co-evaluation PT/OT/SLP Co-Evaluation/Treatment: Yes Reason for Co-Treatment: Complexity of the patient's impairments (multi-system involvement);Necessary to address cognition/behavior during functional activity;For patient/therapist safety;To address functional/ADL transfers PT goals addressed during  session: Mobility/safety with mobility;Balance;Proper use of DME;Strengthening/ROM;Other (comment) OT goals addressed during session: ADL's and self-care       AM-PAC PT "6 Clicks" Mobility  Outcome Measure Help needed turning from your back to your side while in a flat bed without using bedrails?: A Little Help needed moving from lying on your back to sitting on the side of a flat bed without using bedrails?: A Little Help needed moving to and from a bed to a chair (including a wheelchair)?: A Little Help needed standing up from a chair using your arms (e.g., wheelchair or bedside chair)?: A Lot Help needed to walk in hospital room?: A Lot Help needed climbing 3-5 steps with a railing? : A Lot 6 Click Score: 15    End of Session Equipment Utilized During Treatment: Gait belt Activity Tolerance: Patient tolerated treatment well;Patient limited by pain Patient left: in chair;with chair alarm set;with call bell/phone within reach Nurse Communication: Mobility status PT Visit Diagnosis: Unsteadiness on feet (R26.81);Other abnormalities of gait and mobility (R26.89);Repeated falls (R29.6);Muscle weakness (generalized) (M62.81);Difficulty in walking, not elsewhere classified (R26.2)    Time: 1751-0258 PT Time Calculation (min) (ACUTE ONLY): 26 min   Charges:   PT Evaluation $PT Re-evaluation: 1 Re-eval            Dessie Coma PT DPT  02/11/2021, 12:41 PM

## 2021-02-11 NOTE — Plan of Care (Signed)
  Problem: Activity: Goal: Risk for activity intolerance will decrease Outcome: Progressing   Problem: Pain Managment: Goal: General experience of comfort will improve Outcome: Not Progressing   Problem: Skin Integrity: Goal: Risk for impaired skin integrity will decrease Outcome: Progressing

## 2021-02-11 NOTE — Plan of Care (Signed)

## 2021-02-11 NOTE — TOC Initial Note (Signed)
Transition of Care [facility]) - Initial/Assessment Note    Patient Details  Name: Scott Greer MRN: [id number] Date of Birth: 06/05/1956  [facility]) CM/SW Contact:    Levada Schilling Phone Number: 02/11/2021, 2:46 PM  Clinical Narrative:                 CSW spoke with pt by phone.  CSW introduce self and role.  CSW discussed pt recommendations for possible SNF placement.  Pt does not want SNF placement.  Pt prefers HH.  Pt feels comfortable going one.  Pt lives in a single story apartment [place] neighborhood.  Pt has neighbors who will check on him.  Pt would like for pt services at his resident.  Pt has stated" he does not receive enough money from [employer] and disability to keep up his expenses.  Pt reports that his ex wife Tawana [place] help him out at home and she can be contacted.  Pt is estranged from his children.  Expected Discharge Plan: Home w Home Health Services Barriers to Discharge: Continued Medical Work up   Patient Goals and CMS Choice Patient states their goals for this hospitalization and ongoing recovery are:: "to be able to walk and maintain apartment" CMS Medicare.gov Compare Post Acute Care list provided to:: Other (Comment Required) (Pt wants HH not SNF) Choice offered to / list presented to : NA (Pt does not want SNF prefers [facility])  Expected Discharge Plan and Services Expected Discharge Plan: Home w Home Health Services In-house Referral: Clinical Social Work     Living arrangements for the past 2 months: Apartment                                      Prior Living Arrangements/Services Living arrangements for the past 2 months: Apartment Lives with:: Self Patient language and need for interpreter reviewed:: Yes Do you feel safe going back to the place where you live?: Yes      Need for Family Participation in Patient Care: Yes (Comment) Care giver support system in place?: Yes (comment)   Criminal Activity/Legal Involvement  Pertinent to Current Situation/Hospitalization: No - Comment as needed  Activities of Daily Living Home Assistive Devices/Equipment: Cane (specify quad or straight),Crutches,CBG Meter ADL Screening (condition at time of admission) Patient's cognitive ability adequate to safely complete daily activities?: Yes Is the patient deaf or have difficulty hearing?: No Does the patient have difficulty seeing, even when wearing glasses/contacts?: No Does the patient have difficulty concentrating, remembering, or making decisions?: No Patient able to express need for assistance with ADLs?: Yes Does the patient have difficulty dressing or bathing?: No Independently performs ADLs?: Yes (appropriate for developmental age) Does the patient have difficulty walking or climbing stairs?: Yes Weakness of Legs: Both Weakness of Arms/Hands: None  Permission Sought/Granted Permission sought to share information with : Family Supports Permission granted to share information with : Yes, Verbal Permission Granted  Share Information with NAME: Tawanda [place]  Permission granted to share info w AGENCY: Home Health  Permission granted to share info w Relationship: Towana [place], Ex wife  Permission granted to share info w Contact Information: yes  Emotional Assessment Appearance:: Appears stated age Attitude/Demeanor/Rapport: Gracious,Engaged Affect (typically observed): Accepting,Calm,Appropriate Orientation: : Oriented to Self,Oriented to Place,Oriented to  Time,Oriented to Situation Alcohol / Substance Use: Not Applicable Psych Involvement: No (comment)  Admission diagnosis:  Wound infection [  T14.8XXA, L08.9] Left foot infection [L08.9] Patient Active Problem List   Diagnosis Date Noted  . Wound dehiscence   . Gangrene of toe of left foot (HCC)   . Cutaneous abscess of left foot   . Left foot infection 02/07/2021  . Leukocytosis 02/07/2021  . Thrombocytosis 02/07/2021  . Hyponatremia 02/07/2021  .  AKI (acute kidney injury) (HCC) 02/07/2021  . Hyperglycemia due to diabetes mellitus (HCC) 02/07/2021  . Hypoalbuminemia due to protein-calorie malnutrition (HCC) 02/07/2021  . Osteomyelitis of second toe of left foot (HCC)   . New onset of congestive heart failure (HCC) 12/26/2020  . CKD (chronic kidney disease) stage 3, GFR 30-59 ml/min (HCC) 12/26/2020  . Acute systolic CHF (congestive heart failure) (HCC) 12/26/2020  . Anemia, chronic disease 06/22/2020  . Amputee, great toe, right (HCC) 06/20/2020  . Normocytic anemia 06/20/2020  . Erectile dysfunction associated with type 2 diabetes mellitus (HCC) 06/20/2020  . Positive for macroalbuminuria 10/24/2019  . Amputation of left great toe (HCC) 10/23/2019  . Tobacco dependence 10/23/2019  . Osteomyelitis of great toe of right foot (HCC) 06/04/2019  . Diabetic foot infection (HCC)   . Noncompliance   . Glaucoma   . Hyperlipidemia   . Essential hypertension 11/06/2003  . Diabetes mellitus (HCC) 11/05/1997   PCP:  [name], MD Pharmacy:   [facility] [address], [place] - [address] [place] [address] Phone: [phone] Fax: [phone]     Social Determinants of Health (SDOH) Interventions    Readmission Risk Interventions Readmission Risk Prevention Plan 06/08/2019  Transportation Screening Complete  PCP or Specialist Appt within 5-7 Days Complete  Home Care Screening Complete  Medication Review (RN CM) Complete  Some recent data might be hidden

## 2021-02-11 NOTE — Progress Notes (Signed)
Occupational Therapy Evaluation Patient Details Name: Scott Greer MRN: [id number] DOB: [birth year] Today's Date: 02/11/2021    History of Present Illness 65 y.o. male admitted 4/4 with left foot pain from the wound site.  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 Lajoyce Greer, he has missed 2 postoperative visits due to oversleeping and not being able to make it to the appointment.  He noted that the wound opened again and due to ongoing pain in the foot, he decided to go to the ED for further evaluation. Past medical history significant for hypertension, hyperlipidemia, T2DM and recent left second toe amputation. Despite foot salvage intervention options patient had a large abscess that extended to the hindfoot with necrotic skin tissue involving the entire plantar aspect of the foot, pt is now s/p L transtibial amputation.   Clinical Impression   Pt pleasant, now see for re-evaluation post amputation. Goals adjusted accordingly. Seen with PT to ensure safety of pt and therapist during session. Pt with difficulty reporting PLOF or social during session, easily distracted requiring near constant redirection to task for completion. Demo's good UB and trunk strength and sensation, although impulsive tendencies leading to at least min A for safety with transfers and ADL's. Pt insistent that he is able to go home with crutches but at this time, pt is at high risk for falls and rehospitalization and would benefit from post acute OT to maximize indep and safety. OT will continue to follow.     Follow Up Recommendations  SNF;Supervision/Assistance - 24 hour    Equipment Recommendations   (TBD at next LOC)    Recommendations for Other Services       Precautions / Restrictions Precautions Precautions: Fall Required Braces or Orthoses: Other Brace Other Brace: L LE leg protection splint applied Restrictions Weight Bearing Restrictions: Yes LLE Weight Bearing:  Non weight bearing      Mobility Bed Mobility Overal bed mobility: Needs Assistance Bed Mobility: Supine to Sit     Supine to sit: Min assist;HOB elevated     General bed mobility comments: heavy reliance on bed controls.    Transfers Overall transfer level: Needs assistance Equipment used: Rolling walker (2 wheeled) Transfers: Sit to/from [employer] Sit to Stand: Min assist Stand pivot transfers: Min assist       General transfer comment: currently pt requiring min A and cues for safety and sequencing.    Balance                                           ADL either performed or assessed with clinical judgement   ADL Overall ADL's : Needs assistance/impaired Eating/Feeding: Set up;Bed level;Sitting   Grooming: Sitting;Set up;Bed level   Upper Body Bathing: Set up;Sitting;Bed level   Lower Body Bathing: Min guard;Sitting/lateral leans       Lower Body Dressing: Minimal assistance;Sit to/from stand Lower Body Dressing Details (indicate cue type and reason): impulsive, assist for correction of balance with standing tasks, limited by insight and need for UE support on RW Toilet Transfer: Minimal assistance;RW Toilet Transfer Details (indicate cue type and reason): 2 present for safety. Toileting- Clothing Manipulation and Hygiene: Minimal assistance;Sit to/from stand       Functional mobility during ADLs: Rolling walker;Minimal assistance General ADL Comments: pt consitently requiring redirectionto task throughout session, no significant LOB but  noted needing for correction of balance wtih support at trunk despite B UE support on RW and overall gross strength being [facility].     Vision         Perception     Praxis      Pertinent Vitals/Pain Pain Assessment: 0-10 Pain Score: 7  Pain Location: LLE amputation site Pain Descriptors / Indicators: Grimacing;Guarding Pain Intervention(s): Limited activity within patient's  tolerance     Hand Dominance Right   Extremity/Trunk Assessment Upper Extremity Assessment Upper Extremity Assessment: Overall WFL for tasks assessed   Lower Extremity Assessment Lower Extremity Assessment: Defer to PT evaluation   Cervical / Trunk Assessment Cervical / Trunk Assessment: Normal   Communication Communication Communication: No difficulties   Cognition Arousal/Alertness: Awake/alert Behavior During Therapy: Anxious;Impulsive Overall Cognitive Status: No family/caregiver present to determine baseline cognitive functioning                                 General Comments: decreased insight and safety awareness throughout session requiring near constant redirection to task and cues for all sequencing, and observed with limited problem solving. easily distracted   General Comments  wound vac in place, with shrinker and limb protector donned just prior to session    Exercises     Shoulder Instructions      Home Living Family/patient expects to be discharged to:: Private residence Living Arrangements: Alone Available Help at Discharge: Neighbor Type of Home: Apartment Home Access:  (reports no stairs for access)     Home Layout: One level     Bathroom Shower/Tub: Chief Strategy Officer: Standard     Home Equipment: Cane - single point;Crutches   Additional Comments: pt states he has a wife but they do not live together, recently was working but currently not. inconsistent levels of assist reported throughout session. will need further clarification of home support      Prior Functioning/Environment Level of Independence: Independent;Independent with assistive device(s)                 OT Problem List: Decreased activity tolerance;Decreased strength;Impaired balance (sitting and/or standing);Decreased safety awareness;Decreased knowledge of use of DME or AE;Decreased knowledge of precautions;Pain      OT  Treatment/Interventions: Self-care/ADL training;Therapeutic exercise;Energy conservation;DME and/or AE instruction;Therapeutic activities;Cognitive remediation/compensation;Patient/family education;Balance training    OT Goals(Current goals can be found in the care plan section) Acute Rehab OT Goals Patient Stated Goal: to go home today OT Goal Formulation: With patient Time For Goal Achievement: 02/25/21 Potential to Achieve Goals: Fair ADL Goals Pt Will Perform Lower Body Bathing: with supervision;sit to/from stand Pt Will Perform Lower Body Dressing: with set-up Pt Will Transfer to Toilet: with supervision;bedside commode Pt Will Perform Toileting - Clothing Manipulation and hygiene: with supervision;sit to/from stand  OT Frequency: Min 2X/week   Barriers to D/C: Decreased caregiver support          Co-evaluation PT/OT/SLP Co-Evaluation/Treatment: Yes Reason for Co-Treatment: For patient/therapist safety   OT goals addressed during session: ADL's and self-care      AM-PAC OT "6 Clicks" Daily Activity     Outcome Measure Help from another person eating meals?: None Help from another person taking care of personal grooming?: A Little Help from another person toileting, which includes using toliet, bedpan, or urinal?: A Little Help from another person bathing (including washing, rinsing, drying)?: A Little Help from another person to put on and taking  off regular upper body clothing?: A Little Help from another person to put on and taking off regular lower body clothing?: A Little 6 Click Score: 19   End of Session Equipment Utilized During Treatment: Rolling walker Nurse Communication: Mobility status;Precautions  Activity Tolerance: Patient tolerated treatment well Patient left: in chair;with chair alarm set;with call bell/phone within reach  OT Visit Diagnosis: Unsteadiness on feet (R26.81);Pain;Muscle weakness (generalized) (M62.81) Pain - Right/Left: Left Pain - part  of body: Leg                Time: 0912-0940 OT Time Calculation (min): 28 min Charges:  OT General Charges $OT Visit: 1 Visit OT Evaluation $OT Eval Low Complexity: 1 Low  Estie Sproule OTR/L acute rehab services Office: [phone]   02/11/2021, 12:14 PM

## 2021-02-11 NOTE — Progress Notes (Signed)
Inpatient Rehab Admissions Coordinator:   I met with Pt. To discuss potential CIR admission. Pt. Is currently min g with ambulation and transfers. PT and OT are recommending SNF vs. HH. Pt. States he wants to go home but is open to SNF if he does not have to stay there for long. I will not pursue CIR admit for this pt.   Clemens Catholic, [place], [place] Admissions Coordinator  [phone] ([place]) [phone] (office)

## 2021-02-12 DIAGNOSIS — E875 Hyperkalemia: Secondary | ICD-10-CM | POA: Diagnosis not present

## 2021-02-12 DIAGNOSIS — I10 Essential (primary) hypertension: Secondary | ICD-10-CM | POA: Diagnosis not present

## 2021-02-12 DIAGNOSIS — N179 Acute kidney failure, unspecified: Secondary | ICD-10-CM | POA: Diagnosis not present

## 2021-02-12 DIAGNOSIS — E1165 Type 2 diabetes mellitus with hyperglycemia: Secondary | ICD-10-CM | POA: Diagnosis not present

## 2021-02-12 DIAGNOSIS — E871 Hypo-osmolality and hyponatremia: Secondary | ICD-10-CM | POA: Diagnosis not present

## 2021-02-12 DIAGNOSIS — L089 Local infection of the skin and subcutaneous tissue, unspecified: Secondary | ICD-10-CM | POA: Diagnosis not present

## 2021-02-12 DIAGNOSIS — D72829 Elevated white blood cell count, unspecified: Secondary | ICD-10-CM | POA: Diagnosis not present

## 2021-02-12 LAB — BASIC METABOLIC PANEL
Anion gap: 6 (ref 5–15)
BUN: 24 mg/dL — ABNORMAL HIGH (ref 8–23)
CO2: 22 mmol/L (ref 22–32)
Calcium: 8.8 mg/dL — ABNORMAL LOW (ref 8.9–10.3)
Chloride: 105 mmol/L (ref 98–111)
Creatinine, Ser: 1 mg/dL (ref 0.61–1.24)
GFR, Estimated: >60 mL/min (ref >60)
Glucose, Bld: 167 mg/dL — ABNORMAL HIGH (ref 70–99)
Potassium: 5.4 mmol/L — ABNORMAL HIGH (ref 3.5–5.1)
Sodium: 133 mmol/L — ABNORMAL LOW (ref 135–145)

## 2021-02-12 LAB — GLUCOSE, CAPILLARY
Glucose-Capillary: 171 mg/dL — ABNORMAL HIGH (ref 70–99)
Glucose-Capillary: 159 mg/dL — ABNORMAL HIGH (ref 70–99)
Glucose-Capillary: 171 mg/dL — ABNORMAL HIGH (ref 70–99)
Glucose-Capillary: 149 mg/dL — ABNORMAL HIGH (ref 70–99)

## 2021-02-12 LAB — CBC
HCT: 30 % — ABNORMAL LOW (ref 39.0–52.0)
Hemoglobin: 9.7 g/dL — ABNORMAL LOW (ref 13.0–17.0)
MCH: 26.4 pg (ref 26.0–34.0)
MCHC: 32.3 g/dL (ref 30.0–36.0)
MCV: 81.5 fL (ref 80.0–100.0)
Platelets: 483 10*3/uL — ABNORMAL HIGH (ref 150–400)
RBC: 3.68 MIL/uL — ABNORMAL LOW (ref 4.22–5.81)
RDW: 16.1 % — ABNORMAL HIGH (ref 11.5–15.5)
WBC: 14.5 10*3/uL — ABNORMAL HIGH (ref 4.0–10.5)
nRBC: 0 % (ref 0.0–0.2)

## 2021-02-12 MED ORDER — SODIUM ZIRCONIUM CYCLOSILICATE 5 G PO PACK
5.0000 g | PACK | Freq: Two times a day (BID) | ORAL | Status: AC
  Administered 2021-02-12 (×2): 5 g via ORAL
  Filled 2021-02-12 (×2): qty 1

## 2021-02-12 MED ORDER — HYDRALAZINE HCL 25 MG PO TABS
25.0000 mg | ORAL_TABLET | Freq: Three times a day (TID) | ORAL | Status: DC
  Administered 2021-02-12 – 2021-02-13 (×4): 25 mg via ORAL
  Filled 2021-02-12 (×4): qty 1

## 2021-02-12 NOTE — Plan of Care (Signed)

## 2021-02-12 NOTE — Progress Notes (Signed)
Physical Therapy Treatment Patient Details Name: Scott Greer MRN: [id number] DOB: 1956/03/06 Today's Date: 02/12/2021    History of Present Illness 65 y.o. male admitted 4/4 with left foot pain from the wound site.  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 Kirstie Mirza noted that the wound opened again and due to ongoing pain in the foot, he decided to go to the ED for further evaluation. Past medical history significant for hypertension, hyperlipidemia, T2DM and recent left second toe amputation. Despite foot salvage intervention options patient had a large abscess. 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    PT Comments    Focused on std pvt transfers in/out of w/c to bed and w/c management today. Pt impulsive with decreased insight to safety and deficits. Pt desiring to use one crutch since that's what he has at home. Pt educated on how the RW is more steady than crutches however pt wants to try them next session. Pt with tangential speech and is easily distractible requiring freq re-direction. Pt was appreciative of PT services. Acute PT to cont to follow.    Follow Up Recommendations  SNF;Supervision/Assistance - 24 hour (aware pt is refusing, does need to be able to negotiate a large step from sidewalk up to his house)     Equipment Recommendations  Rolling walker with 5" wheels;Wheelchair (measurements PT);Wheelchair cushion (measurements PT) (with elevating leg rests)    Recommendations for Other Services       Precautions / Restrictions Precautions Precautions: Fall Required Braces or Orthoses: Other Brace Other Brace: L LE leg protection splint applied Restrictions Weight Bearing Restrictions: Yes LLE Weight Bearing: Non weight bearing    Mobility  Bed Mobility Overal bed mobility: Needs Assistance Bed Mobility: Supine to Sit     Supine to sit: Supervision;HOB elevated     General bed mobility comments: increased time, no  physical assist needed, used bed rail    Transfers Overall transfer level: Needs assistance Equipment used: Rolling walker (2 wheeled) Transfers: Sit to/from [employer] Sit to Stand: Min assist Stand pivot transfers: Min assist       General transfer comment: minA to steady and for walker management, discussed w/c management/placement when not using RW to complete std pvt  Ambulation/Gait Ambulation/Gait assistance: Min guard Gait Distance (Feet): 5 Feet Assistive device: Rolling walker (2 wheeled) Gait Pattern/deviations: Step-to pattern   Gait velocity interpretation: <1.31 ft/sec, indicative of household ambulator General Gait Details: worked on hopping for std pvt transfers and w/c's mobility today, pt does want to trial crutches next session   Psychologist, counselling mobility: Yes Wheelchair propulsion: Both upper extremities;Right lower extremity Wheelchair parts: Needs assistance Distance: 200 Wheelchair Assistance Details (indicate cue type and reason): verbal cues for lock and arm rest management, focused on reaching farther back on wheel for increased propulsion distance, pt pushed self backwards with R foot only x 30 feet for strengthening and pulled self forward x30 feet with R LE for strengthening  Modified Rankin (Stroke Patients Only)       Balance Overall balance assessment: Needs assistance Sitting-balance support: Bilateral upper extremity supported Sitting balance-Leahy Scale: Fair     Standing balance support: Bilateral upper extremity supported Standing balance-Leahy Scale: Poor Standing balance comment: needs RW  Cognition Arousal/Alertness: Awake/alert Behavior During Therapy: Impulsive Overall Cognitive Status: No family/caregiver present to determine baseline cognitive functioning                                 General  Comments: pt with tangential speech, decreased insight to deficits and safety      Exercises Amputee Exercises Quad Sets: AROM;Left;10 reps;Supine Gluteal Sets: AROM;Left;10 reps;Supine Hip Flexion/Marching: AROM;Left;10 reps;Supine (knee to chest)    General Comments General comments (skin integrity, edema, etc.): wound vac in place      Pertinent Vitals/Pain Pain Assessment: 0-10 Pain Score: 5  Pain Location: L residual limb Pain Descriptors / Indicators: Sore Pain Intervention(s): Monitored during session    Home Living                      Prior Function            PT Goals (current goals can now be found in the care plan section) Acute Rehab PT Goals Patient Stated Goal: gohome Progress towards PT goals: Progressing toward goals    Frequency    Min 3X/week      PT Plan Current plan remains appropriate    Co-evaluation              AM-PAC PT "6 Clicks" Mobility   Outcome Measure  Help needed turning from your back to your side while in a flat bed without using bedrails?: A Little Help needed moving from lying on your back to sitting on the side of a flat bed without using bedrails?: A Little Help needed moving to and from a bed to a chair (including a wheelchair)?: A Little Help needed standing up from a chair using your arms (e.g., wheelchair or bedside chair)?: A Lot Help needed to walk in hospital room?: A Lot Help needed climbing 3-5 steps with a railing? : A Lot 6 Click Score: 15    End of Session Equipment Utilized During Treatment: Gait belt Activity Tolerance: Patient tolerated treatment well;Patient limited by pain Patient left: in chair;with chair alarm set;with call bell/phone within reach Nurse Communication: Mobility status PT Visit Diagnosis: Unsteadiness on feet (R26.81);Other abnormalities of gait and mobility (R26.89);Repeated falls (R29.6);Muscle weakness (generalized) (M62.81);Difficulty in walking, not elsewhere  classified (R26.2)     Time: 7209-4709 PT Time Calculation (min) (ACUTE ONLY): 40 min  Charges:  $Therapeutic Exercise: 8-22 mins $Therapeutic Activity: 8-22 mins $Wheel Chair Management: 8-22 mins                     [name], PT, DPT Acute Rehabilitation Services Pager #: [phone] Office #: [phone]    [name] 02/12/2021, 2:06 PM

## 2021-02-12 NOTE — Progress Notes (Signed)
Patient ID: Scott Greer, male   DOB: 11/26/1955, 65 y.o.   MRN: [id number] Nursing called earlier this morning about the patient's hypertension.  Apparently he is not on any blood pressure medications.  The patient is on the medicine service with [facility].  I will defer blood pressure control and meds for hypertension to their expertise.

## 2021-02-12 NOTE — Plan of Care (Signed)
  Problem: Education: Goal: Knowledge of General Education information will improve Description Including pain rating scale, medication(s)/side effects and non-pharmacologic comfort measures Outcome: Progressing   

## 2021-02-12 NOTE — Progress Notes (Signed)
PROGRESS NOTE   Scott Greer  ZOX:[id number]    DOB: 01/27/1956    DOA: 02/06/2021  PCP: Marcine MatarJohnson, Deborah B, MD   I have briefly reviewed patients [facility]  Chief Complaint  Patient presents with  . Foot Pain    Brief Narrative:  65 year old male, lives alone, medical history significant for but not limited to hypertension, hyperlipidemia, type II DM, PAD, s/p right first toe amputation, recent left first and second ray amputations on 01/25/2021 by Dr. Lajoyce Cornersuda, missed follow-up appointments and presented with pain, wound dehiscence and purulent drainage from left first and second ray amputation site.  Admitted for PAD with wound dehiscence of the first and second ray amputation with purulent drainage and dry gangrenous changes to the third toe.  Dr. Lajoyce Cornersuda recommended transtibial amputation, patient declined, agreed and initially underwent transmetatarsal amputation 4/6.  However per Dr. Lajoyce Cornersuda, he had extensive abscess that extended up to the hindfoot, strongly recommended BKA which patient initially declined but subsequently consented and underwent left transtibial amputation with application of wound VAC 4/8.   Assessment & Plan:  Principal Problem:   Left foot infection Active Problems:   Essential hypertension   Hyperlipidemia   Noncompliance   Leukocytosis   Thrombocytosis   Hyponatremia   AKI (acute kidney injury) (HCC)   Hyperglycemia due to diabetes mellitus (HCC)   Hypoalbuminemia due to protein-calorie malnutrition (HCC)   Wound dehiscence   Gangrene of toe of left foot (HCC)   Cutaneous abscess of left foot   PAD with wound dehiscence of the recent first and second ray amputation (01/25/2021) with purulent drainage and dry gangrenous changes to the third toe.  Did not meet sepsis criteria on admission  X-ray of left foot showed gas throughout the soft tissues of the foot but no changes of acute osteomyelitis  Continue empirically  started IV cefepime and vancomycin.  Blood cultures x2: Negative to date.  Dr. Lajoyce Cornersuda, orthopedics consulted, recommended transtibial amputation, patient declined, agreed to and underwent left transmetatarsal amputation 4/6.  I discussed with Dr. Lajoyce Cornersuda post TMA on 4/7.  Patient had extensive abscess that extended up to the hindfoot, he was apparently unable to close the wound which was packed open.  He strongly recommends BKA.    Patient had initially declined left BKA, but agreed to surgery and he underwent left transtibial amputation with application of wound VAC 4/8.  Pain control and mobilization per therapies.  Nonweightbearing on left lower extremity.  Per Dr. Lajoyce Cornersuda, DC antibiotics 24 hours postop.  Discontinued IV antibiotics 4/10.  Pain better controlled.  Patient declining SNF and adamant on going home with home health services and DME and states that he has neighbors and family who can come by and assist.  Anemia  Hemoglobin was in the 12 g range during recent hospitalization, admitted with 11.2.  Likely multifactorial due to hemodilution, acute illness, surgery and chronic disease.  S/p 1 unit PRBC transfusion perioperatively 4/8, Hb up from 8.6-10.9 postop  Hemoglobin stable.  Follow CBC daily and transfuse if hemoglobin 7 g or less.  Leukocytosis  Presented with WBC of 28.5.  WBC down from 18.9-14.5.  Reactive thrombocytosis  Mild and fluctuant  Hyponatremia  Could be related to dehydration.  Sodium 126 on admission  Urine osmolarity: 482, urine sodium 12.  Serum osmolarity 290 on admission.   Has been fluctuating but stable in the low 130s over the last couple of days.  Trend daily BMP.  Acute  kidney injury complicating stage III a CKD  Baseline creatinine reportedly 1.1-1.3.  Presented with creatinine of 1.54.  Resolved after IV fluids.  Hyperkalemia, mild  Intermittent.  Had normalized after a dose of Lokelma yesterday but potassium up to 5.4 again  today.  Changed to renal/carb modified diet with low potassium  Lokelma 5 g twice daily x2 today.  Follow BMP in a.m.  ?  Type IV RTA  Type II DM with hyperglycemia  Now reasonably controlled on SSI alone, continue  A1c 8.4 on 12/14/2020.  Metformin and Jardiance currently on hold  Essential hypertension  Uncontrolled.  Started hydralazine 25 mg 3 times daily.  Holding off home Entresto, diuretics etc. due to recent AKI.  Hyperlipidemia  Continue Lipitor.  Chronic combined systolic and diastolic CHF  2D echo 12/26/2020: LVEF 25-30% with LV global hypokinesis.  Clinically euvolemic.  PTA meds: Amiodarone?  Indication, aspirin 81 mg, digoxin, lisinopril, Entresto, Aldactone and Demadex on hold.  Need to reconsider timing of resumption of some or all of them.  Interestingly metoprolol not on this list which was there at recent discharge  During recent admission in March 2022, had been seen by advanced heart failure team.  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  They had recommended above meds along with Jardiance and potassium supplements.  Consider reaching out to them tomorrow regarding initiation of these may  Moderate protein calorie malnutrition  Continue nutritional supplementation.  Noncompliance with treatment regimen  Patient missed for surgical follow-up.  Counseled extensively regarding importance of compliance with all aspects of medical care and he verbalized understanding   Body mass index is 24.88 kg/m.     DVT prophylaxis: SCD's Start: 02/10/21 1543 SCD's Start: 02/08/21 1634 SCDs Start: 02/07/21 0032     Code Status: Full Code Family Communication: Patient declined MDs offer to speak with his family. Disposition:  Status is: Inpatient  Remains inpatient appropriate because:Inpatient level of care appropriate due to severity of illness   Dispo: The patient is from: Home              Anticipated d/c is to: Home.  Appropriate for SNF but  patient adamantly refusing.  We will continue to encourage same.              Patient currently is not medically stable to d/c.   Difficult to place patient No        Consultants:   Orthopedics  Procedures:   4/6: Left transmetatarsal amputation by Dr. Lajoyce Corners 4/8: Left transtibial amputation by Dr. Lajoyce Corners  Antimicrobials:    Anti-infectives (From admission, onward)   Start     Dose/Rate Route Frequency Ordered Stop   02/10/21 0815  ceFAZolin (ANCEF) IVPB 2g/100 mL premix        2 g 200 mL/hr over 30 Minutes Intravenous To Short Stay 02/10/21 0808 02/10/21 1200   02/08/21 1500  vancomycin (VANCOREADY) IVPB 1000 mg/200 mL  Status:  Discontinued        1,000 mg 200 mL/hr over 60 Minutes Intravenous Every 12 hours 02/08/21 0846 02/12/21 0841   02/08/21 0600  ceFAZolin (ANCEF) IVPB 2g/100 mL premix        2 g 200 mL/hr over 30 Minutes Intravenous On call to O.R. 02/07/21 1835 02/08/21 1457   02/07/21 2200  vancomycin (VANCOCIN) 1,750 mg in sodium chloride 0.9 % 500 mL IVPB  Status:  Discontinued        1,750 mg 250 mL/hr over 120 Minutes Intravenous Every 24  hours 02/07/21 0429 02/07/21 0828   02/07/21 2200  vancomycin (VANCOREADY) IVPB 1750 mg/350 mL  Status:  Discontinued        1,750 mg 175 mL/hr over 120 Minutes Intravenous Every 24 hours 02/07/21 0828 02/08/21 0846   02/07/21 1000  ceFEPIme (MAXIPIME) 2 g in sodium chloride 0.9 % 100 mL IVPB  Status:  Discontinued        2 g 200 mL/hr over 30 Minutes Intravenous Every 12 hours 02/07/21 0429 02/07/21 0923   02/07/21 0930  ceFEPIme (MAXIPIME) 2 g in sodium chloride 0.9 % 100 mL IVPB  Status:  Discontinued        2 g 200 mL/hr over 30 Minutes Intravenous Every 8 hours 02/07/21 0923 02/12/21 0841   02/06/21 2245  vancomycin (VANCOREADY) IVPB 1500 mg/300 mL        1,500 mg 150 mL/hr over 120 Minutes Intravenous  Once 02/06/21 2239 02/07/21 0231   02/06/21 2245  ceFEPIme (MAXIPIME) 2 g in sodium chloride 0.9 % 100 mL IVPB        2  g 200 mL/hr over 30 Minutes Intravenous  Once 02/06/21 2239 02/06/21 2344        Subjective:  Left lower extremity postop pain better controlled.  Adamantly refuses SNF for STIR and wishes to go home as soon as possible, hoping tomorrow.   Objective:   Vitals:   02/11/21 1950 02/12/21 0530 02/12/21 0900 02/12/21 1117  BP:  (!) 170/102 (!) 156/78 (!) 156/77  Pulse:  89 90 79  Resp: 16 16 16 18   Temp:  98.2 F (36.8 C) 98.6 F (37 C) (!) 97.5 F (36.4 C)  TempSrc:  Oral Axillary Oral  SpO2: 100% 100% 100% 100%  Weight:      Height:        General exam: Middle-age male, moderately built and nourished sitting up comfortably in bed without distress. Respiratory system: Clear to auscultation.  No increased work of breathing Cardiovascular system: S1 and S2 heard, RRR.  No JVD, murmurs or pedal edema. Gastrointestinal system: Abdomen is nondistended, soft and nontender. No organomegaly or masses felt. Normal bowel sounds heard. Central nervous system: Alert and oriented.. No focal neurological deficits. Extremities: Symmetric 5 x 5 power.  Left transtibial amputation in stump shrinker and wound VAC-stable. Skin: No rashes, lesions or ulcers Psychiatry: Judgement and insight appear normal. Mood & affect appropriate.     Data Reviewed:   I have personally reviewed following labs and imaging studies   CBC: Recent Labs  Lab 02/06/21 2057 02/07/21 0346 02/10/21 0255 02/10/21 1939 02/11/21 0118 02/12/21 0217  WBC 28.5*   < > 12.1*  --  18.9* 14.5*  NEUTROABS 24.8*  --   --   --   --   --   HGB 11.2*   < > 8.6* 10.9* 9.7* 9.7*  HCT 34.5*   < > 26.4* 33.7* 29.8* 30.0*  MCV 81.4   < > 81.0  --  81.4 81.5  PLT 563*   < > 460*  --  436* 483*   < > = values in this interval not displayed.    Basic Metabolic Panel: Recent Labs  Lab 02/07/21 0346 02/08/21 0715 02/11/21 0622 02/11/21 1737 02/12/21 0217  NA 129*   < > 131* 131* 133*  K 4.4   < > 5.4* 4.8 5.4*  CL 92*    < > 101 102 105  CO2 27   < > 24 24 22   GLUCOSE  163*   < > 276* 141* 167*  BUN 32*   < > 30* 27* 24*  CREATININE 1.45*   < > 1.14 1.03 1.00  CALCIUM 8.8*   < > 8.8* 8.7* 8.8*  MG 1.8  --   --   --   --   PHOS 3.2  --   --   --   --    < > = values in this interval not displayed.    Liver Function Tests: Recent Labs  Lab 02/06/21 2057 02/07/21 0346  AST 17 15  ALT 13 11  ALKPHOS 98 88  BILITOT 1.7* 0.8  PROT 7.5 6.7  ALBUMIN 2.5* 2.3*    CBG: Recent Labs  Lab 02/11/21 1642 02/11/21 1946 02/12/21 0649  GLUCAP 116* 142* 171*    Microbiology Studies:   Recent Results (from the past 240 hour(s))  Culture, blood (routine x 2)     Status: None   Collection Time: 02/06/21  8:58 PM   Specimen: BLOOD RIGHT ARM  Result Value Ref Range Status   Specimen Description BLOOD RIGHT ARM  Final   Special Requests   Final    BOTTLES DRAWN AEROBIC AND ANAEROBIC Blood Culture results may not be optimal due to an excessive volume of blood received in culture bottles   Culture   Final    NO GROWTH 5 DAYS Performed at [facility] Lab, [address], [place], [place] [address]    Report Status 02/11/2021 FINAL  Final  Culture, blood (routine x 2)     Status: None   Collection Time: 02/06/21  8:58 PM   Specimen: BLOOD LEFT ARM  Result Value Ref Range Status   Specimen Description BLOOD LEFT ARM  Final   Special Requests   Final    BOTTLES DRAWN AEROBIC ONLY Blood Culture results may not be optimal due to an excessive volume of blood received in culture bottles   Culture   Final    NO GROWTH 5 DAYS Performed at [facility], [address] [place], [place] [address]    Report Status 02/11/2021 FINAL  Final  SARS CORONAVIRUS 2 (TAT 6-24 HRS) Nasopharyngeal Nasopharyngeal Swab     Status: None   Collection Time: 02/07/21  1:07 AM   Specimen: Nasopharyngeal Swab  Result Value Ref Range Status   SARS Coronavirus 2 NEGATIVE NEGATIVE Final    Comment: (NOTE) SARS-CoV-2  target nucleic acids are NOT DETECTED.  The SARS-CoV-2 RNA is generally detectable in upper and lower respiratory specimens during the acute phase of infection. Negative results do not preclude SARS-CoV-2 infection, do not rule out co-infections with other pathogens, and should not be used as the sole basis for treatment or other patient management decisions. Negative results must be combined with clinical observations, patient history, and epidemiological information. The expected result is Negative.  Fact Sheet for Patients: HairSlick.no  Fact Sheet for Healthcare Providers: quierodirigir.com  This test is not yet approved or cleared by the [place] FDA and  has been authorized for detection and/or diagnosis of SARS-CoV-2 by FDA under an Emergency Use Authorization (EUA). This EUA will remain  in effect (meaning this test can be used) for the duration of the COVID-19 declaration under Se ction 564(b)(1) of the Act, 21 U.S.C. section 360bbb-3(b)(1), unless the authorization is terminated or revoked sooner.  Performed at [facility] Lab, [address], [place], [place] [address]   Surgical pcr screen     Status:  None   Collection Time: 02/07/21  7:02 PM   Specimen: Nasal Mucosa; Nasal Swab  Result Value Ref Range Status   MRSA, PCR NEGATIVE NEGATIVE Final   Staphylococcus aureus NEGATIVE NEGATIVE Final    Comment: (NOTE) The Xpert SA Assay (FDA approved for NASAL specimens in patients 32 years of age and older), is one component of a comprehensive surveillance program. It is not intended to diagnose infection nor to guide or monitor treatment. Performed at [facility], [address], [place], [place] [address]      Radiology Studies:  No results found.   Scheduled Meds:   . atorvastatin  10 mg Oral Daily  . docusate sodium  100 mg Oral Daily  . feeding supplement (GLUCERNA SHAKE)  237 mL Oral  TID BM  . gabapentin  300 mg Oral TID  . hydrALAZINE  25 mg Oral Q8H  . insulin aspart  0-15 Units Subcutaneous TID WC  . insulin aspart  0-5 Units Subcutaneous QHS  . pantoprazole  40 mg Oral Daily  . sodium zirconium cyclosilicate  5 g Oral BID    Continuous Infusions:      LOS: 5 days     Marcellus Scott, MD, [place], [facility]. [facility]    To contact the attending provider between 7A-7P or the covering provider during after hours 7P-7A, please log into the web site [email or web address] and access using universal [place] password for that web site. If you do not have the password, please call the hospital operator.  02/12/2021, 12:19 PM

## 2021-02-13 ENCOUNTER — Telehealth (HOSPITAL_COMMUNITY): Payer: Self-pay | Admitting: Licensed Clinical Social Worker

## 2021-02-13 DIAGNOSIS — D72829 Elevated white blood cell count, unspecified: Secondary | ICD-10-CM | POA: Diagnosis not present

## 2021-02-13 DIAGNOSIS — E871 Hypo-osmolality and hyponatremia: Secondary | ICD-10-CM | POA: Diagnosis not present

## 2021-02-13 DIAGNOSIS — I5022 Chronic systolic (congestive) heart failure: Secondary | ICD-10-CM | POA: Diagnosis not present

## 2021-02-13 DIAGNOSIS — L089 Local infection of the skin and subcutaneous tissue, unspecified: Secondary | ICD-10-CM | POA: Diagnosis not present

## 2021-02-13 DIAGNOSIS — E875 Hyperkalemia: Secondary | ICD-10-CM

## 2021-02-13 DIAGNOSIS — I10 Essential (primary) hypertension: Secondary | ICD-10-CM | POA: Diagnosis not present

## 2021-02-13 DIAGNOSIS — N179 Acute kidney failure, unspecified: Secondary | ICD-10-CM | POA: Diagnosis not present

## 2021-02-13 DIAGNOSIS — E1165 Type 2 diabetes mellitus with hyperglycemia: Secondary | ICD-10-CM | POA: Diagnosis not present

## 2021-02-13 LAB — CBC
HCT: 27.3 % — ABNORMAL LOW (ref 39.0–52.0)
Hemoglobin: 8.8 g/dL — ABNORMAL LOW (ref 13.0–17.0)
MCH: 26.9 pg (ref 26.0–34.0)
MCHC: 32.2 g/dL (ref 30.0–36.0)
MCV: 83.5 fL (ref 80.0–100.0)
Platelets: 480 10*3/uL — ABNORMAL HIGH (ref 150–400)
RBC: 3.27 MIL/uL — ABNORMAL LOW (ref 4.22–5.81)
RDW: 16.2 % — ABNORMAL HIGH (ref 11.5–15.5)
WBC: 13.4 10*3/uL — ABNORMAL HIGH (ref 4.0–10.5)
nRBC: 0 % (ref 0.0–0.2)

## 2021-02-13 LAB — BASIC METABOLIC PANEL
Anion gap: 3 — ABNORMAL LOW (ref 5–15)
BUN: 25 mg/dL — ABNORMAL HIGH (ref 8–23)
CO2: 25 mmol/L (ref 22–32)
Calcium: 8.5 mg/dL — ABNORMAL LOW (ref 8.9–10.3)
Chloride: 105 mmol/L (ref 98–111)
Creatinine, Ser: 1.2 mg/dL (ref 0.61–1.24)
GFR, Estimated: >60 mL/min (ref >60)
Glucose, Bld: 185 mg/dL — ABNORMAL HIGH (ref 70–99)
Potassium: 4.6 mmol/L (ref 3.5–5.1)
Sodium: 133 mmol/L — ABNORMAL LOW (ref 135–145)

## 2021-02-13 LAB — GLUCOSE, CAPILLARY
Glucose-Capillary: 167 mg/dL — ABNORMAL HIGH (ref 70–99)
Glucose-Capillary: 174 mg/dL — ABNORMAL HIGH (ref 70–99)
Glucose-Capillary: 138 mg/dL — ABNORMAL HIGH (ref 70–99)
Glucose-Capillary: 205 mg/dL — ABNORMAL HIGH (ref 70–99)
Glucose-Capillary: 207 mg/dL — ABNORMAL HIGH (ref 70–99)

## 2021-02-13 MED ORDER — ISOSORBIDE MONONITRATE ER 30 MG PO TB24
30.0000 mg | ORAL_TABLET | Freq: Every day | ORAL | Status: DC
  Administered 2021-02-13 – 2021-02-14 (×2): 30 mg via ORAL
  Filled 2021-02-13 (×2): qty 1

## 2021-02-13 MED ORDER — HYDRALAZINE HCL 50 MG PO TABS
50.0000 mg | ORAL_TABLET | Freq: Three times a day (TID) | ORAL | Status: DC
  Administered 2021-02-13 – 2021-02-14 (×2): 50 mg via ORAL
  Filled 2021-02-13 (×3): qty 1

## 2021-02-13 MED ORDER — AMIODARONE HCL 200 MG PO TABS
200.0000 mg | ORAL_TABLET | Freq: Every day | ORAL | Status: DC
  Administered 2021-02-13 – 2021-02-15 (×3): 200 mg via ORAL
  Filled 2021-02-13 (×3): qty 1

## 2021-02-13 MED ORDER — LIVING WELL WITH DIABETES BOOK
Freq: Once | Status: AC
  Administered 2021-02-13: 1
  Filled 2021-02-13: qty 1

## 2021-02-13 MED ORDER — DIGOXIN 125 MCG PO TABS
0.1250 mg | ORAL_TABLET | Freq: Every day | ORAL | Status: DC
  Administered 2021-02-13 – 2021-02-15 (×3): 0.125 mg via ORAL
  Filled 2021-02-13 (×3): qty 1

## 2021-02-13 MED ORDER — EMPAGLIFLOZIN 10 MG PO TABS
10.0000 mg | ORAL_TABLET | Freq: Every day | ORAL | Status: DC
  Administered 2021-02-14 – 2021-02-15 (×2): 10 mg via ORAL
  Filled 2021-02-13 (×2): qty 1

## 2021-02-13 NOTE — Consult Note (Addendum)
Advanced Heart Failure Team Consult Note   Primary Physician: [name], MD PCP-Cardiologist:  Mertie Moores, MD  HF : Dr Aundra Dubin.  Reason for Consultation: Heart Failure   HPI:    Scott Greer is seen today for evaluation of heart failure at the request of Dr Algis Liming.   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   Presented to [facility] ED on 12/26/20 with lower extremity edema and weakness. Echo showed biventricular HF (LVEF 25-30% + severely reduced RV). Diuresed with IV lasix, and started on Entresto, Jardiance, and spiro. Had high PVC burden so Toprol XL increased to 100 mg daily. Had cath (2/22): with elevated filling pressures, low output HF, and min CAD. Diuresed 20 lbs, started on GDMT, discharge weight 199 lbs.  He had follow up in the HF clinic on 01/12/21. Entresto and torsemide both increased. Referred to HF paramedicine. HFSW actively following.   01/25/21 underwent 2nd toe amputation due to gangrene/osteo. Discharged the same day.   On 01/30/21 HF Paramedic evaluated him at his home. He had not started antibiotics even though it was in the home. Medications set up but he was not taking as directed. Continue to drink alcohol beer and liquor.   Readmitted with left foot infection. Missed orthopedic follow appointments. Left foot xray with gas. Placed on vanc and cefepime. Manson Passey consulted. S/P Transtibial amputation. He has not been on spiro or entresto due to hyperkalemia.   Feels ok. He is worried about his housing and un paid bills.   Cardiac Studies.  ECHO  12/2020 - 25-30% +severely reduced RV RHC/LHC (2/22): Ost LAD -Prox LAD 30% stenosed RA 17 PA 47/27 (36)  PCWP 22 CO 3 CI 1.3 cMRI (3/22):  -Technically difficult study due to poor breath-holding. - LV EF 23%, RVEF 22%, nonspecific RV insertion LGE.   Review of Systems: [y] = yes, [ ]  = no   . General: Weight gain [ ]; Weight loss [ ]; Anorexia [ ]; Fatigue [ ]; Fever [ ]; Chills [ ]; Weakness [ ]  . Cardiac: Chest pain/pressure [ ]; Resting SOB [ ]; Exertional SOB [ ]; Orthopnea [ ]; Pedal Edema [ ]; Palpitations [ ]; Syncope [ ]; Presyncope [ ]; Paroxysmal nocturnal dyspnea[ ]  . Pulmonary: Cough [ ]; Wheezing[ ]; Hemoptysis[ ]; Sputum [ ]; Snoring [ ]  . GI: Vomiting[ ]; Dysphagia[ ]; Melena[ ]; Hematochezia [ ]; Heartburn[ ]; Abdominal pain [ ]; Constipation [ ]; Diarrhea [ ]; BRBPR [ ]  . GU: Hematuria[ ]; Dysuria [ ]; Nocturia[ ]  . Vascular: Pain in legs with walking [ ]; Pain in feet with lying flat [ ]; Non-healing sores [ ]; Stroke [ ]; TIA [ ]; Slurred speech [ ];  . Neuro: Headaches[ ]; Vertigo[ ]; Seizures[ ]; Paresthesias[ ];Blurred vision [ ]; Diplopia [ ]; Vision changes [ ]  . Ortho/Skin: Arthritis [ ]; Joint pain [Y ]; Muscle pain [ ]; Joint swelling [ ]; Back Pain [Y ]; Rash [ ]  . Psych: Depression[ ]; Anxiety[ ]  . Heme: Bleeding problems [ ]; Clotting disorders [ ]; Anemia [ ]  . Endocrine: Diabetes [Y ]; Thyroid dysfunction[ ]  Home Medications Prior to Admission medications   Medication Sig Start Date End Date Taking? Authorizing Provider  amiodarone (PACERONE) 200 MG tablet Take 1 tablet (200  mg total) by mouth daily. 01/30/21  Yes Milford, Maricela Bo, FNP  aspirin 81 MG chewable tablet Chew 1 tablet (81 mg total) by mouth daily. 01/30/21  Yes Milford, Maricela Bo, FNP  atorvastatin (LIPITOR) 10 MG tablet Take 1 tablet (10 mg total) by mouth daily. 01/30/21  Yes Milford, Maricela Bo, FNP  digoxin (LANOXIN) 0.125 MG tablet Take 1 tablet (0.125 mg total) by mouth daily. 01/30/21  Yes Milford, Maricela Bo, FNP  empagliflozin (JARDIANCE) 10 MG TABS tablet Take 1 tablet (10 mg total) by mouth daily. 01/30/21  Yes Milford, Maricela Bo, FNP  lisinopril (ZESTRIL) 10 MG tablet Take 10 mg by mouth daily.   Yes [provider]  metFORMIN (GLUCOPHAGE) 500 MG tablet  Take 1 tablet (500 mg total) by mouth 2 (two) times daily with a meal. 12/14/20  Yes [name], Cari S, PA-C  oxyCODONE-acetaminophen (PERCOCET/ROXICET) 5-325 MG tablet Take 1 tablet by mouth every 4 (four) hours as needed for severe pain. 01/23/21  Yes Newt Minion, MD  Potassium Chloride ER 20 MEQ TBCR Take 20 mEq by mouth daily. 01/30/21  Yes Milford, Maricela Bo, FNP  sacubitril-valsartan (ENTRESTO) 97-103 MG Take 1 tablet by mouth 2 (two) times daily. 01/30/21  Yes Milford, Maricela Bo, FNP  spironolactone (ALDACTONE) 25 MG tablet Take 1 tablet (25 mg total) by mouth daily. 01/30/21  Yes Milford, Maricela Bo, FNP  torsemide (DEMADEX) 20 MG tablet Take 3 tablets (60 mg total) by mouth daily. 01/30/21  Yes Milford, Maricela Bo, FNP  Blood Glucose Monitoring Suppl (TRUE METRIX METER) w/Device KIT Use as directed 10/23/19   [name], MD  glucose blood (TRUE METRIX BLOOD GLUCOSE TEST) test strip Use as instructed 10/23/19   [name], MD  TRUEplus Lancets 28G MISC Use as directed 10/23/19   [name], MD  Vitamin D, Ergocalciferol, (DRISDOL) 1.25 MG (50000 UNIT) CAPS capsule Take 1 capsule (50,000 Units total) by mouth every 7 (seven) days. Patient not taking: No sig reported 12/16/20   [name], [name], PA-C    Past Medical History: Past Medical History:  Diagnosis Date  . Dehiscence of amputation stump (HCC)    left great toe  . Diabetes mellitus without complication ([place]) 6967   Type II  . Glaucoma 2015  . Hyperlipidemia   . Hypertension 2005  . Left foot infection 02/07/2021  . Osteomyelitis ([place])   . Wears glasses     Past Surgical History: Past Surgical History:  Procedure Laterality Date  . AMPUTATION Left 06/05/2019   Procedure: LEFT GREAT TOE AMPUTATION;  Surgeon: Newt Minion, MD;  Location: [place];  Service: Orthopedics;  Laterality: Left;  . AMPUTATION Left 07/10/2019   Procedure: LEFT FOOT 1ST RAY AMPUTATION;  Surgeon: Newt Minion, MD;  Location: [place];   Service: Orthopedics;  Laterality: Left;  . AMPUTATION Right 05/25/2020   Procedure: RIGHT GREAT TOE AMPUTATION;  Surgeon: Newt Minion, MD;  Location: [place];  Service: Orthopedics;  Laterality: Right;  . AMPUTATION Left 01/25/2021   Procedure: LEFT 2ND TOE AMPUTATION;  Surgeon: Newt Minion, MD;  Location: [place];  Service: Orthopedics;  Laterality: Left;  . AMPUTATION Left 02/08/2021   Procedure: LEFT TRANSMETATARSAL AMPUTATION;  Surgeon: Newt Minion, MD;  Location: [place];  Service: Orthopedics;  Laterality: Left;  . AMPUTATION Left 02/10/2021   Procedure: AMPUTATION BELOW KNEE;  Surgeon: Newt Minion, MD;  Location: [place];  Service: Orthopedics;  Laterality: Left;  . NO  PAST SURGERIES    . RIGHT/LEFT HEART CATH AND CORONARY ANGIOGRAPHY N/A 01/02/2021   Procedure: RIGHT/LEFT HEART CATH AND CORONARY ANGIOGRAPHY;  Surgeon: Lorretta Harp, MD;  Location: [place] [facility];  Service: Cardiovascular;  Laterality: N/A;    Family History: Family History  Problem Relation Age of Onset  . Stroke Mother   . Diabetes Mother        Toward end of life    Social History: Social History   Socioeconomic History  . Marital status: Legally Separated    Spouse name: Nira Conn  . Number of children: 2  . Years of education: Not on file  . Highest education level: Associate degree: occupational, Hotel manager, or vocational program  Occupational History  . Not on file  Tobacco Use  . Smoking status: Current Every Day Smoker    Types: Cigars, Cigarettes  . Smokeless tobacco: Former Systems developer    Types: Chew  . Tobacco comment: 6 daily  Vaping Use  . Vaping Use: Never used  Substance and Sexual Activity  . Alcohol use: Yes    Alcohol/week: 11.0 standard drinks    Types: 3 Cans of beer, 8 Shots of liquor per week    Comment: occasional  . Drug use: Yes    Types: Marijuana    Comment: ocassional - last time 05/08/2020  . Sexual activity: Not Currently  Other Topics Concern  . Not on file  Social  History Narrative   Out of prison for 2 months.   Lives at home with his wife.   Social Determinants of Health   Financial Resource Strain: High Risk  . Difficulty of Paying Living Expenses: Very hard  Food Insecurity: Food Insecurity Present  . Worried About Charity fundraiser in the Last Year: Sometimes true  . Ran Out of Food in the Last Year: Sometimes true  Transportation Needs: Unmet Transportation Needs  . Lack of Transportation (Medical): Yes  . Lack of Transportation (Non-Medical): Yes  Physical Activity: Inactive  . Days of Exercise per Week: 0 days  . Minutes of Exercise per Session: 0 min  Stress: Not on file  Social Connections: Not on file    Allergies:  Allergies  Allergen Reactions  . Bee Venom Hives, Itching and Swelling    Objective:    Vital Signs:   Temp:  [97.5 F (36.4 C)-98.6 F (37 C)] 98.1 F (36.7 C) (04/11 0841) Pulse Rate:  [79-89] 80 (04/11 0841) Resp:  [17-18] 18 (04/11 0841) BP: (148-159)/(77-89) 148/81 (04/11 0841) SpO2:  [100 %] 100 % (04/11 0841)    Weight change: Filed Weights   02/07/21 0900  Weight: 90.3 kg    Intake/Output:   Intake/Output Summary (Last 24 hours) at 02/13/2021 0953 Last data filed at 02/13/2021 0820 Gross per 24 hour  Intake --  Output 3910 ml  Net -3910 ml      Physical Exam    General:  No resp difficulty HEENT: normal Neck: supple. JVP 5-6  . Carotids 2+ bilat; no bruits. No lymphadenopathy or thyromegaly appreciated. Cor: PMI nondisplaced. Regular rate & rhythm. No rubs, gallops or murmurs. Lungs: clear Abdomen: soft, nontender, nondistended. No hepatosplenomegaly. No bruits or masses. Good bowel sounds. Extremities: no cyanosis, clubbing, rash, edema. L Transtibial amputatio. VAC in place.  Neuro: alert & orientedx3, cranial nerves grossly intact. moves all 4 extremities w/o difficulty. Affect pleasant   Telemetry   Not on monitor  EKG    4/6 SR 75 bpm  with PVC  Labs   Basic  Metabolic Panel: Recent Labs  Lab 02/07/21 0346 02/08/21 0715 02/11/21 0118 02/11/21 0622 02/11/21 1737 02/12/21 0217 02/13/21 0212  NA 129*   < > 132* 131* 131* 133* 133*  K 4.4   < > 5.7* 5.4* 4.8 5.4* 4.6  CL 92*   < > 99 101 102 105 105  CO2 27   < > _0 GLUCOSE 163*   < > 264* 276* 141* 167* 185*  BUN 32*   < > 31* 30* 27* 24* 25*  CREATININE 1.45*   < > 1.18 1.14 1.03 1.00 1.20  CALCIUM 8.8*   < > 8.9 8.8* 8.7* 8.8* 8.5*  MG 1.8  --   --   --   --   --   --   PHOS 3.2  --   --   --   --   --   --    < > = values in this interval not displayed.    Liver Function Tests: Recent Labs  Lab 02/06/21 2057 02/07/21 0346  AST 17 15  ALT 13 11  ALKPHOS 98 88  BILITOT 1.7* 0.8  PROT 7.5 6.7  ALBUMIN 2.5* 2.3*   No results for input(s): LIPASE, AMYLASE in the last 168 hours. No results for input(s): AMMONIA in the last 168 hours.  CBC: Recent Labs  Lab 02/06/21 2057 02/07/21 0346 02/09/21 0338 02/10/21 0255 02/10/21 1939 02/11/21 0118 02/12/21 0217 02/13/21 0212  WBC 28.5*   < > 14.5* 12.1*  --  18.9* 14.5* 13.4*  NEUTROABS 24.8*  --   --   --   --   --   --   --   HGB 11.2*   < > 9.2* 8.6* 10.9* 9.7* 9.7* 8.8*  HCT 34.5*   < > 28.1* 26.4* 33.7* 29.8* 30.0* 27.3*  MCV 81.4   < > 80.7 81.0  --  81.4 81.5 83.5  PLT 563*   < > 457* 460*  --  436* 483* 480*   < > = values in this interval not displayed.    Cardiac Enzymes: No results for input(s): CKTOTAL, CKMB, CKMBINDEX, TROPONINI in the last 168 hours.  BNP: BNP (last 3 results) Recent Labs    12/25/20 0019  BNP 2,579.0*    ProBNP (last 3 results) No results for input(s): PROBNP in the last 8760 hours.   CBG: Recent Labs  Lab 02/12/21 0649 02/12/21 1324 02/12/21 1706 02/12/21 1935 02/13/21 0642  GLUCAP 171* 159* 171* 149* 167*    Coagulation Studies: No results for input(s): LABPROT, INR in the last 72 hours.   Imaging    No results found.   Medications:     Current  Medications: . atorvastatin  10 mg Oral Daily  . docusate sodium  100 mg Oral Daily  . feeding supplement (GLUCERNA SHAKE)  237 mL Oral TID BM  . gabapentin  300 mg Oral TID  . hydrALAZINE  25 mg Oral Q8H  . insulin aspart  0-15 Units Subcutaneous TID WC  . insulin aspart  0-5 Units Subcutaneous QHS  . pantoprazole  40 mg Oral Daily     Infusions:     Assessment/Plan  1. L Foot Infection -Osteo. Had IV antibiotics on admit.  -S/P L Transtibial amputation.   -WBC 13.4   2. Chronic biventricular HFrEF -ECHO 12/2020 EF 25-30% RV severely reduced.  CMRI LV EF 23%, RVEF 22%, nonspecific RV insertion LGE.  LHC  with 30% LAD. NICM. Possible ETOH +/- PVC - Volume status stable. Hold off on diuretics.   - Restart digoxin 0.125 mg daily.  - Had recent low output on RHC 2/22 with CI 1.3 . No BB -  Can stay off spiro/entresto with hyperkalemia  - Start jardiance 10 mg daily tomorrow (ordere placed)   - Increase hydralazine 50 mg three times a day and add Imdur 30 mg daily   3. Hyperkalemia  Stay off spiro/entresto for now.   4. AKI  Creatinine on admit 1.54, creatinine 1.2 today   5. H/O PVCs In the past he was on amiodarone due to PVCs.  - Will restart amio 200 mg daily.   6. ETOH  Discussed cessation.   Difficult to manage. Poor insight and medication errors prior to admit. HH/HF Paramedicine to see at d/c . HFSW following closely due to issues with un paid bills.    Length of Stay: [place], NP  02/13/2021, 9:53 AM  Advanced Heart Failure Team Pager [phone] (M-F; 7a - 5p)  Please contact [facility] for night-coverage after hours (4p -7a ) and weekends on [email or web address]  Patient seen with NP, agree with the above note.   He is now s/p left AKA due to osteomyelitis in setting of diabetes.  We are asked to see him for HF management.   General: NAD Neck: No JVD, no thyromegaly or thyroid nodule.  Lungs: Clear to auscultation bilaterally with normal respiratory  effort. CV: Nondisplaced PMI.  Heart regular S1/S2, no S3/S4, no murmur.  No peripheral edema.  No carotid bruit.  Difficult to palpate right PT pulse.   Abdomen: Soft, nontender, no hepatosplenomegaly, no distention.  Skin: Intact without lesions or rashes.  Neurologic: Alert and oriented x 3.  Psych: Normal affect. Extremities: No clubbing or cyanosis. L BKA.  HEENT: Normal.   Patient has a nonischemic cardiomyopathy, possibly due to heavy ETOH.  On exam today, he is not significantly volume overloaded.  Low output on recent cath.  - He needs to remain abstinent from ETOH.  - Not LVAD/transplant candidate due to osteomyelitis, social situation, and ETOH abuse.  - Restart digoxin today.  - Start hydralazine/Imdur today as above.  - Start SGLT2 inhibitor tomorrow.  - K mildly elevated today, would like to get on Entresto/spironolactone eventually, but will recheck K tomorrow.   Will need close followup in HF clinic and with paramedicine.   Loralie Champagne 02/13/2021 1:27 PM

## 2021-02-13 NOTE — Progress Notes (Addendum)
Inpatient Diabetes Program Recommendations  AACE/ADA: New Consensus Statement on Inpatient Glycemic Control (2015)  Target Ranges:  Prepandial:   less than 140 mg/dL      Peak postprandial:   less than 180 mg/dL (1-2 hours)      Critically ill patients:  140 - 180 mg/dL   Lab Results  Component Value Date   GLUCAP 174 (H) 02/13/2021   HGBA1C 9.1 (H) 02/08/2021    Review of Glycemic Control Results for GEROGE, Scott Greer (MRN [id number]) as of 02/13/2021 13:48  Ref. Range 02/12/2021 13:24 02/12/2021 17:06 02/12/2021 19:35 02/13/2021 06:42 02/13/2021 11:59  Glucose-Capillary Latest Ref Range: 70 - 99 mg/dL 295 (H) 188 (H) 416 (H) 167 (H) 174 (H)   Diabetes history:  DM2 Outpatient Diabetes medications:  Metformin 500 mg BID Current orders for Inpatient glycemic control:  Novolog 0-15 units TID & 0-5 QHS Jardiance 10 mg daily to start 4/12   Spoke with patient at bedside.  Reviewed patient's current A1c of 9.1% (average blood sugar of 215 mg/dL over the last 2-3 months). Explained what a A1c is and what it measures. Also reviewed goal A1c with patient, importance of good glucose control @ home, and blood sugar goals.  He does not check his blood sugar at home.  He states he has been taking his Metformin as prescribed.  Discussed importance og glucose control for optimal healing and decrease risk of further complications.  Ordered the Living Well with Diabetes booklet.  He states he does not drink beverages with sugar but does like ice cream and sweets.  Encouraged limiting sweets and CHO's.  Reviewed foods which contain CHO's.  He is very worried about how he will be able to cook and clean in his apartment.  He is also worried about how he will pay his utility bills and rent while he is here in the hospital.  He is requesting a SW consult.  TOC referral has already been placed.  Encouraged him to check his blood sugar at least 1 time a day and follow up with PCP.    Will continue to follow while  inpatient.  Thank you, Dulce Sellar, RN, BSN Diabetes Coordinator Inpatient Diabetes Program [phone] (team pager from 8a-5p)

## 2021-02-13 NOTE — Progress Notes (Signed)
PROGRESS NOTE   Scott Greer  HMC:[id number]    DOB: [birth year]    DOA: 02/06/2021  PCP: [name], MD   I have briefly reviewed patients previous medical records in [facility].  Chief Complaint  Patient presents with  . Foot Pain    Brief Narrative:  65 year old male, lives alone, medical history significant for but not limited to hypertension, hyperlipidemia, type II DM, PAD, s/p right first toe amputation, recent left first and second ray amputations on 01/25/2021 by Dr. Sharol Given, missed follow-up appointments and presented with pain, wound dehiscence and purulent drainage from left first and second ray amputation site.  Admitted for PAD with wound dehiscence of the first and second ray amputation with purulent drainage and dry gangrenous changes to the third toe.  Dr. Sharol Given recommended transtibial amputation, patient declined, agreed and initially underwent transmetatarsal amputation 4/6.  However per Dr. Sharol Given, he had extensive abscess that extended up to the hindfoot, strongly recommended BKA which patient initially declined but subsequently consented and underwent left transtibial amputation with application of wound VAC 4/8.   Assessment & Plan:  Principal Problem:   Left foot infection Active Problems:   Essential hypertension   Hyperlipidemia   Noncompliance   Leukocytosis   Thrombocytosis   Hyponatremia   AKI (acute kidney injury) ([place])   Hyperglycemia due to diabetes mellitus (HCC)   Hypoalbuminemia due to protein-calorie malnutrition (HCC)   Wound dehiscence   Gangrene of toe of left foot (HCC)   Cutaneous abscess of left foot   PAD with wound dehiscence of the recent first and second ray amputation (01/25/2021) with purulent drainage and dry gangrenous changes to the third toe.  Did not meet sepsis criteria on admission  X-ray of left foot showed gas throughout the soft tissues of the foot but no changes of acute osteomyelitis  Blood cultures x2:  Negative.  Treated empirically with IV cefepime and vancomycin from admission and was discontinued 24 hours postop.    Dr. Sharol Given, orthopedics consulted, recommended transtibial amputation, patient declined, agreed to and underwent left transmetatarsal amputation 4/6.  Intraoperatively during TMA, patient had extensive abscess that extended up to the hindfoot, Dr. Sharol Given was apparently unable to close the wound which was packed open.  He strongly recommended BKA  Patient had again declined left BKA, but agreed to surgery and he underwent left transtibial amputation with application of wound VAC 4/8.  Pain control and mobilization per therapies.  Nonweightbearing on left lower extremity.  Pain controlled.  Patient continues to decline and adamant on going home with home health services and DME and states that he has neighbors and family who can come by and assist.  Discussed in detail with [facility] regarding arranging for home health services and DME for possible DC home 4/12.  Also discussed with PT to have further evaluation and management today and tomorrow prior to discharge.  Anemia  Hemoglobin was in the 12 g range during recent hospitalization, admitted with 11.2.  Likely multifactorial due to hemodilution, acute illness, surgery and chronic disease.  S/p 1 unit PRBC transfusion perioperatively 4/8.  Hemoglobin relatively stable/8.8 today.  Follow CBC daily and transfuse if hemoglobin 7 g or less.  Leukocytosis  Presented with WBC of 28.5.  WBC slowly decreasing.  Reactive thrombocytosis  Stable  Hyponatremia  Could be related to dehydration.  Sodium 126 on admission  Urine osmolarity: 482, urine sodium 12.  Serum osmolarity 290 on admission.   Stable in the low 130s  for several days now.  Acute kidney injury complicating stage III a CKD  Baseline creatinine reportedly 1.1-1.3.  Presented with creatinine of 1.54.  Resolved after IV fluids.  Hyperkalemia,  mild  Intermittent.  Had normalized after a dose of Lokelma yesterday but potassium up to 5.4 again today.  Changed to renal/carb modified diet with low potassium  Lokelma 5 g twice daily x2 on 4/10.  Resolved again.  Follow BMP in a.m.  ?  Type IV RTA  Type II DM with hyperglycemia  Now reasonably controlled on SSI alone, continue  A1c 8.4 on 12/14/2020.  Metformin and Jardiance currently on hold  Essential hypertension  Uncontrolled.  Started hydralazine 25 mg 3 times daily.  Better controlled  Holding off home Entresto, diuretics etc. due to recent AKI.  Hyperlipidemia  Continue Lipitor.  Chronic combined systolic and diastolic CHF  2D echo 2/44/0102: LVEF 25-30% with LV global hypokinesis.  Clinically euvolemic.  PTA meds: Amiodarone?  Indication, aspirin 81 mg, digoxin, lisinopril, Entresto, Aldactone and Demadex on hold.  Need to reconsider timing of resumption of some or all of them.  Interestingly metoprolol not on this list which was there at recent discharge  During recent admission in March 2022, had been seen by advanced heart failure team.  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  They had recommended above meds along with Jardiance and potassium supplements.  Consider reaching out to them tomorrow regarding initiation of these may  Advanced HF team input appreciated.  I discussed with them this morning.  Euvolemic and holding off on diuretics.  Resuming digoxin 0.125 mg daily.  Okay to stay off spironolactone and Entresto due to issues with hyperkalemia.  Hydralazine increased to 50 mg 3 times daily and Imdur 30 daily added.  Amiodarone reinitiated due to prior history of PVCs.  History of PVCs  Cardiology is reinitiated amiodarone 200 mg daily.  Alcohol use disorder  As per history from Cardiology input 4/11.  No overt withdrawal.  Abstinence discussed by them  Moderate protein calorie malnutrition  Continue nutritional  supplementation.  Noncompliance with treatment regimen  Patient missed for surgical follow-up.  Counseled extensively regarding importance of compliance with all aspects of medical care and he verbalized understanding  Agree with Cardiology that patient is difficult to manage due to poor insight and medication errors prior to admission.  Home health and heart failure para medicine team to see at discharge.  Heart failure Education officer, museum following closely due to issues with unpaid bills   Body mass index is 24.88 kg/m.     DVT prophylaxis: SCD's Start: 02/10/21 1543 SCD's Start: 02/08/21 1634 SCDs Start: 02/07/21 0032     Code Status: Full Code Family Communication: Patient declined MDs offer to speak with his family. Disposition:  Status is: Inpatient  Remains inpatient appropriate because:Inpatient level of care appropriate due to severity of illness   Dispo: The patient is from: Home              Anticipated d/c is to: Home.  Appropriate for SNF but patient adamantly refusing.  We will continue to encourage same.              Patient currently is not medically stable to d/c.   Difficult to place patient No        Consultants:   Orthopedics  Procedures:   4/6: Left transmetatarsal amputation by Dr. Sharol Given 4/8: Left transtibial amputation by Dr. Sharol Given  Antimicrobials:    Anti-infectives (From admission, onward)  Start     Dose/Rate Route Frequency Ordered Stop   02/10/21 0815  ceFAZolin (ANCEF) IVPB 2g/100 mL premix        2 g 200 mL/hr over 30 Minutes Intravenous To Short Stay 02/10/21 0808 02/10/21 1200   02/08/21 1500  vancomycin (VANCOREADY) IVPB 1000 mg/200 mL  Status:  Discontinued        1,000 mg 200 mL/hr over 60 Minutes Intravenous Every 12 hours 02/08/21 0846 02/12/21 0841   02/08/21 0600  ceFAZolin (ANCEF) IVPB 2g/100 mL premix        2 g 200 mL/hr over 30 Minutes Intravenous On call to O.R. 02/07/21 1835 02/08/21 1457   02/07/21 2200  vancomycin  (VANCOCIN) 1,750 mg in sodium chloride 0.9 % 500 mL IVPB  Status:  Discontinued        1,750 mg 250 mL/hr over 120 Minutes Intravenous Every 24 hours 02/07/21 0429 02/07/21 0828   02/07/21 2200  vancomycin (VANCOREADY) IVPB 1750 mg/350 mL  Status:  Discontinued        1,750 mg 175 mL/hr over 120 Minutes Intravenous Every 24 hours 02/07/21 0828 02/08/21 0846   02/07/21 1000  ceFEPIme (MAXIPIME) 2 g in sodium chloride 0.9 % 100 mL IVPB  Status:  Discontinued        2 g 200 mL/hr over 30 Minutes Intravenous Every 12 hours 02/07/21 0429 02/07/21 0923   02/07/21 0930  ceFEPIme (MAXIPIME) 2 g in sodium chloride 0.9 % 100 mL IVPB  Status:  Discontinued        2 g 200 mL/hr over 30 Minutes Intravenous Every 8 hours 02/07/21 0923 02/12/21 0841   02/06/21 2245  vancomycin (VANCOREADY) IVPB 1500 mg/300 mL        1,500 mg 150 mL/hr over 120 Minutes Intravenous  Once 02/06/21 2239 02/07/21 0231   02/06/21 2245  ceFEPIme (MAXIPIME) 2 g in sodium chloride 0.9 % 100 mL IVPB        2 g 200 mL/hr over 30 Minutes Intravenous  Once 02/06/21 2239 02/06/21 2344        Subjective:  States that his left lower extremity postop pain is controlled.  Met with orthopedics this morning and says that he will be going home tomorrow.  No dyspnea or chest pain.   Objective:   Vitals:   02/12/21 2140 02/13/21 0525 02/13/21 0841 02/13/21 1300  BP: (!) 159/86 (!) 157/89 (!) 148/81 (!) 147/82  Pulse: 89 86 80 76  Resp: 17 17 18 16   Temp: 98.1 F (36.7 C) 98.6 F (37 C) 98.1 F (36.7 C) 98.6 F (37 C)  TempSrc: Oral Oral Oral Oral  SpO2: 100% 100% 100% 100%  Weight:      Height:        General exam: Middle-age male, moderately built and nourished sitting up comfortably in bed without distress. Respiratory system: Clear to auscultation.  No increased work of breathing. Cardiovascular system: S1 and S2 heard, RRR.  No JVD, murmurs or pedal edema. Gastrointestinal system: Abdomen is nondistended, soft and  nontender. No organomegaly or masses felt. Normal bowel sounds heard. Central nervous system: Alert and oriented.. No focal neurological deficits. Extremities: Symmetric 5 x 5 power.  Left transtibial amputation site stump shrinker.  Looks like the wound VAC has been removed. Skin: No rashes, lesions or ulcers Psychiatry: Judgement and insight appear normal. Mood & affect appropriate.     Data Reviewed:   I have personally reviewed following labs and imaging studies   CBC:  Recent Labs  Lab 02/06/21 2057 02/07/21 0346 02/11/21 0118 02/12/21 0217 02/13/21 0212  WBC 28.5*   < > 18.9* 14.5* 13.4*  NEUTROABS 24.8*  --   --   --   --   HGB 11.2*   < > 9.7* 9.7* 8.8*  HCT 34.5*   < > 29.8* 30.0* 27.3*  MCV 81.4   < > 81.4 81.5 83.5  PLT 563*   < > 436* 483* 480*   < > = values in this interval not displayed.    Basic Metabolic Panel: Recent Labs  Lab 02/07/21 0346 02/08/21 0715 02/11/21 1737 02/12/21 0217 02/13/21 0212  NA 129*   < > 131* 133* 133*  K 4.4   < > 4.8 5.4* 4.6  CL 92*   < > 102 105 105  CO2 27   < > 24 22 25   GLUCOSE 163*   < > 141* 167* 185*  BUN 32*   < > 27* 24* 25*  CREATININE 1.45*   < > 1.03 1.00 1.20  CALCIUM 8.8*   < > 8.7* 8.8* 8.5*  MG 1.8  --   --   --   --   PHOS 3.2  --   --   --   --    < > = values in this interval not displayed.    Liver Function Tests: Recent Labs  Lab 02/06/21 2057 02/07/21 0346  AST 17 15  ALT 13 11  ALKPHOS 98 88  BILITOT 1.7* 0.8  PROT 7.5 6.7  ALBUMIN 2.5* 2.3*    CBG: Recent Labs  Lab 02/12/21 1935 02/13/21 0642 02/13/21 1159  GLUCAP 149* 167* 174*    Microbiology Studies:   Recent Results (from the past 240 hour(s))  Culture, blood (routine x 2)     Status: None   Collection Time: 02/06/21  8:58 PM   Specimen: BLOOD RIGHT ARM  Result Value Ref Range Status   Specimen Description BLOOD RIGHT ARM  Final   Special Requests   Final    BOTTLES DRAWN AEROBIC AND ANAEROBIC Blood Culture results  may not be optimal due to an excessive volume of blood received in culture bottles   Culture   Final    NO GROWTH 5 DAYS Performed at [facility], [place] [address], [place], [place] [address]    Report Status 02/11/2021 FINAL  Final  Culture, blood (routine x 2)     Status: None   Collection Time: 02/06/21  8:58 PM   Specimen: BLOOD LEFT ARM  Result Value Ref Range Status   Specimen Description BLOOD LEFT ARM  Final   Special Requests   Final    BOTTLES DRAWN AEROBIC ONLY Blood Culture results may not be optimal due to an excessive volume of blood received in culture bottles   Culture   Final    NO GROWTH 5 DAYS Performed at [facility], [place] [address] [place], [place] [address]    Report Status 02/11/2021 FINAL  Final  SARS CORONAVIRUS 2 (TAT 6-24 HRS) Nasopharyngeal Nasopharyngeal Swab     Status: None   Collection Time: 02/07/21  1:07 AM   Specimen: Nasopharyngeal Swab  Result Value Ref Range Status   SARS Coronavirus 2 NEGATIVE NEGATIVE Final    Comment: (NOTE) SARS-CoV-2 target nucleic acids are NOT DETECTED.  The SARS-CoV-2 RNA is generally detectable in upper and lower respiratory specimens during the acute phase of infection. Negative results do not preclude SARS-CoV-2  infection, do not rule out co-infections with other pathogens, and should not be used as the sole basis for treatment or other patient management decisions. Negative results must be combined with clinical observations, patient history, and epidemiological information. The expected result is Negative.  Fact Sheet for Patients: SugarRoll.be  Fact Sheet for Healthcare Providers: https://www.woods-mathews.com/  This test is not yet approved or cleared by the [place] FDA and  has been authorized for detection and/or diagnosis of SARS-CoV-2 by FDA under an Emergency Use Authorization (EUA). This EUA will remain  in effect (meaning this test can be  used) for the duration of the COVID-19 declaration under Se ction 564(b)(1) of the Act, 21 U.S.C. section 360bbb-3(b)(1), unless the authorization is terminated or revoked sooner.  Performed at [facility], [place] [address], [place], [place] [address]   Surgical pcr screen     Status: None   Collection Time: 02/07/21  7:02 PM   Specimen: Nasal Mucosa; Nasal Swab  Result Value Ref Range Status   MRSA, PCR NEGATIVE NEGATIVE Final   Staphylococcus aureus NEGATIVE NEGATIVE Final    Comment: (NOTE) The Xpert SA Assay (FDA approved for NASAL specimens in patients 17 years of age and older), is one component of a comprehensive surveillance program. It is not intended to diagnose infection nor to guide or monitor treatment. Performed at [facility], [place] [address], [place], [place] [address]      Radiology Studies:  No results found.   Scheduled Meds:   . amiodarone  200 mg Oral Daily  . atorvastatin  10 mg Oral Daily  . digoxin  0.125 mg Oral Daily  . docusate sodium  100 mg Oral Daily  . [START ON 02/14/2021] empagliflozin  10 mg Oral Daily  . feeding supplement (GLUCERNA SHAKE)  237 mL Oral TID BM  . gabapentin  300 mg Oral TID  . hydrALAZINE  50 mg Oral Q8H  . insulin aspart  0-15 Units Subcutaneous TID WC  . insulin aspart  0-5 Units Subcutaneous QHS  . isosorbide mononitrate  30 mg Oral Daily  . living well with diabetes book   Does not apply Once  . pantoprazole  40 mg Oral Daily    Continuous Infusions:      LOS: 6 days     Vernell Leep, MD, [place], [facility]. [facility]    To contact the attending provider between 7A-7P or the covering provider during after hours 7P-7A, please log into the web site [email or web address] and access using universal [place] password for that web site. If you do not have the password, please call the hospital operator.  02/13/2021, 1:08 PM

## 2021-02-13 NOTE — Progress Notes (Signed)
Patient ID: Scott Greer, male   DOB: 08/04/1956, 65 y.o.   MRN: [id number] Patient is status post transtibial amputation.  He states he would like to go home but has a lot of social issues.  Patient will need social worker to assist with these issues.  Patient will need a folding wheelchair and a walker with a seat and a regular walker.

## 2021-02-13 NOTE — Progress Notes (Signed)
Physical Therapy Treatment Patient Details Name: Scott Greer MRN: [id number] DOB: [birth year] Today's Date: 02/13/2021    History of Present Illness 65 y.o. male admitted 4/4 with left foot pain from the wound site.  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 Kirstie Mirza noted that the wound opened again and due to ongoing pain in the foot, he decided to go to the ED for further evaluation. Past medical history significant for hypertension, hyperlipidemia, T2DM and recent left second toe amputation. Despite foot salvage intervention options patient had a large abscess. 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    PT Comments    Pt remains impulsive with decreased insight to deficits and safety. Pt very dependent on RW for hopping with inconsistent foot clearance but continues to desire crutches. States "Lavenia Atlas been using 1 crutch for a long time." PT informed pt that was when he had both lower extremities intact and that using one crutch after having a BKA was very unsafe and would make his fall risk very high. Pt states "then get me 2 crutches." pt did agree with walker was more steady but he is afraid he won't be able to get around his house with a RW. Pt assured he wouldn't have a bariatric walker that is in his room now and it would be more narrow and easier to get around home. Acute PT to cont to follow.   Follow Up Recommendations  SNF;Supervision/Assistance - 24 hour (aware pt is refusing, will needs 24/7 assist and HHPT)     Equipment Recommendations  Rolling walker with 5" wheels;Wheelchair (measurements PT);Wheelchair cushion (measurements PT)    Recommendations for Other Services       Precautions / Restrictions Precautions Precautions: Fall Required Braces or Orthoses: Other Brace Other Brace: L LE leg protection splint applied Restrictions Weight Bearing Restrictions: Yes LLE Weight Bearing: Non weight bearing    Mobility  Bed  Mobility Overal bed mobility: Needs Assistance Bed Mobility: Supine to Sit     Supine to sit: Supervision     General bed mobility comments: incresaed time, HOB elevated, use of bed rail    Transfers Overall transfer level: Needs assistance Equipment used: Rolling walker (2 wheeled) Transfers: Sit to/from Stand Sit to Stand: Min guard         General transfer comment: min guard for safety, max verbal cues to push up from the bed not pull up on walker, pt continuously trying to pull up on walker  Ambulation/Gait Ambulation/Gait assistance: Min assist Gait Distance (Feet): 20 Feet Assistive device: Rolling walker (2 wheeled) Gait Pattern/deviations: Step-to pattern Gait velocity: dec Gait velocity interpretation: <1.31 ft/sec, indicative of household ambulator General Gait Details: pt would fatigue and about every 5' wouldn't clear foot and scuff the bottom of his R foot   Stairs Stairs: Yes Stairs assistance: Min assist;+2 safety/equipment Stair Management: Backwards;With walker (platform step to mimic home set up) Number of Stairs: 1 General stair comments: pt educated and provided demonstration, pt with no carry over of technique and stated opposite of how PT instructed pt. With max directional verbal cues and minA pt able to negotiate short platform step backwards with RW, pt with most difficulty managing RW up the steps   Wheelchair Mobility    Modified Rankin (Stroke Patients Only)       Balance Overall balance assessment: Needs assistance Sitting-balance support: No upper extremity supported;Feet supported Sitting balance-Leahy Scale: Good Sitting balance - Comments:  pt able to don R shoe and tie without difficulty   Standing balance support: Bilateral upper extremity supported Standing balance-Leahy Scale: Poor Standing balance comment: needs RW                            Cognition Arousal/Alertness: Awake/alert Behavior During Therapy:  Impulsive Overall Cognitive Status: No family/caregiver present to determine baseline cognitive functioning                                 General Comments: pt remains with tangential speech, pt with strong desire to use crutches despite max education on why that would be unsafe as pt can barely clear foot with RW consistently. Pt agrees the walker is more stable yet continues to ask for crutches      Exercises Amputee Exercises Quad Sets: AROM;Left;10 reps;Supine Gluteal Sets: AROM;Left;10 reps;Supine Hip Flexion/Marching: AROM;Left;10 reps;Supine    General Comments General comments (skin integrity, edema, etc.): L wounnd vac in place      Pertinent Vitals/Pain Pain Assessment: Faces Faces Pain Scale: Hurts a little bit Pain Location: L residual limb Pain Descriptors / Indicators: Sore Pain Intervention(s): Monitored during session    Home Living                      Prior Function            PT Goals (current goals can now be found in the care plan section) Acute Rehab PT Goals Patient Stated Goal: go home Progress towards PT goals: Progressing toward goals    Frequency    Min 3X/week      PT Plan Current plan remains appropriate    Co-evaluation              AM-PAC PT "6 Clicks" Mobility   Outcome Measure  Help needed turning from your back to your side while in a flat bed without using bedrails?: A Little Help needed moving from lying on your back to sitting on the side of a flat bed without using bedrails?: A Little Help needed moving to and from a bed to a chair (including a wheelchair)?: A Little Help needed standing up from a chair using your arms (e.g., wheelchair or bedside chair)?: A Little Help needed to walk in hospital room?: A Little Help needed climbing 3-5 steps with a railing? : A Lot 6 Click Score: 17    End of Session Equipment Utilized During Treatment: Gait belt Activity Tolerance: Patient tolerated  treatment well;Patient limited by pain Patient left: in chair;with chair alarm set;with call bell/phone within reach Nurse Communication: Mobility status PT Visit Diagnosis: Unsteadiness on feet (R26.81);Other abnormalities of gait and mobility (R26.89);Repeated falls (R29.6);Muscle weakness (generalized) (M62.81);Difficulty in walking, not elsewhere classified (R26.2)     Time: 1400-1430 PT Time Calculation (min) (ACUTE ONLY): 30 min  Charges:  $Gait Training: 23-37 mins                     [name], PT, DPT Acute Rehabilitation Services Pager #: [phone] Office #: [phone]    [name] 02/13/2021, 4:42 PM

## 2021-02-13 NOTE — Progress Notes (Incomplete)
***In Progress*** PCP: Dr. Jonah Blue HF Cardiologist: Dr. 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  He has been disabled for the last couple of years. He has difficulty paying for medications. He stopped getting meds about 9 months ago. He has difficulty paying for housing.  Presented to [facility] ED on 12/26/20 with lower extremity edema and weakness. Echo showed biventricular HF (LVEF 25-30% + severely reduced RV). Diuresed with IV furosemide, and started on Entresto, Jardiance, and spironolactone. Had high PVC burden so metoprolol succinate was increased to 100 mg daily. Had cath (12/2020): with elevated filling pressures, low output HF, and min CAD. Diuresed 20 lbs, started on GDMT, discharge weight 199 lbs.  He returned for HF post-hospital follow up on 01/12/21. Overall was feeling fine, SOB if walking fast. Denied increasing SOB, CP, dizziness, edema, or PND/Orthopnea. Appetite was ok. No fever or chills. Did not weigh at home. Was taking all medications. Smoked 6 cigars/day, 1 pint of liquor/week, sometimes more.   Today he returns to HF clinic for pharmacist medication titration. At last visit with MD, Sherryll Burger was increased to 97/103 mg BID and torsemide was increased to 60 mg daily due to slightly increased volume status on exam. ***     Overall feeling ***. Dizziness, lightheadedness, fatigue:  Chest pain or palpitations:  How is your breathing?: *** SOB: Able to complete all ADLs. Activity level ***  Weight at home pounds. Takes furosemide/torsemide/bumex *** mg *** daily.  LEE PND/Orthopnea  Appetite *** Low-salt diet:   Physical Exam Cost/affordability of meds    -no labs today (most recent BMET 02/07/21 from hospitalization was stable) -restart metoprolol at 12.5 mg if dry - concerned about low CO  -decr torsemide to 40 mg? -  depending on volume status -incr hydral to 75 mg TID? - depending on BP, compliance -f/u on 5/3 with APP    HF Medications: Entresto 97/103 mg BID Spironolactone 25 mg daily Jardiance 10 mg daily Hydralazine 50 mg TID Isosorbide mononitrate 30 mg daily Digoxin 0.125 mg daily Torsemide 60 mg daily   Has the patient been experiencing any side effects to the medications prescribed?  {YES NO:22349}  Does the patient have any problems obtaining medications due to transportation or finances?   {YES NO:22349}  Understanding of regimen: {excellent/good/fair/poor:19665} Understanding of indications: {excellent/good/fair/poor:19665} Potential of compliance: {excellent/good/fair/poor:19665} Patient understands to avoid NSAIDs. Patient understands to avoid decongestants.    Pertinent Lab Values: . Serum creatinine ***, BUN ***, Potassium ***, Sodium ***, BNP ***, Magnesium ***, Digoxin ***   Vital Signs: . Weight: *** (last clinic weight: ***) . Blood pressure: ***  . Heart rate: ***   Assessment/Plan: 1. Chronic systolic CHF: Echo this admission reviewed, EF 20-25% range with severe RV dysfunction. 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 HIV negative. He drinks, but not markedly heavily (pint/week liquor). He apparently had poorly controlled HTN prior to admission (not taking meds for about 9 months). 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 RHCon 2/28/22showed elevated filling pressures especially on the right side, also low CI at 1.3. cMRI with LV EF 23%, RVEF 22%, nonspecific RV insertion LGE. Excellent diuresis, weight down 20 lbs (50 lbs total).  - NYHA II, physically limited by amputated toes and foot pain. Volume up slightly on exam, although weight stable. -Increase  torsemide 60 mg daily. May be able to back down at next visit w/ increase in [place]. - Continue  Entresto to 97/103 mg BID. - Continue spironolactone 25 mg daily.  - Continue Jardiance 10 mg daily. -Continuedigoxin 0.125 daily. Dig level today. - Consider adding metoprolol at next visit.*** - Suspect he would not be compliant with Bidil at home.  - Given social situation and ETOH abuse, he would not be a good candidate for advanced therapies at this time. - ETOH may have played a major role in CMP. Cessation of ETOH was discussed previously.  2. CKD: Stage 3a. 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 BMET today. 3. HTN: BP stable today, increase Entresto as above. 4. PVCs: Would avoid high dose metoprolol succinate for now with recent low output/volume overload.  - Continue amiodarone 200 mg bid to try to control PVCs. 5. ETOH abuse: needs complete cessation.      Lucina Mellow (PharmD Candidate 2022)  Karle Plumber, PharmD, BCPS, BCCP, CPP Heart Failure Clinic Pharmacist [phone]

## 2021-02-13 NOTE — Telephone Encounter (Signed)
Outpatient Heart Failure CSW met with pt in hospital on Friday to check in prior to surgery.  Pt was somewhat worried about how he will manage everything at home with new amputation.  Inpatient TOC team working on obtaining home health and DME equipment but outpatient Heart Failure CSW discuss applying for PCS services for pt to assist more long term- pt in agreement.  CSW called [facility] Medicaid this morning and submitted PCS request- they are sending message to supervisor to expedite request as pt will be leaving the hospital and will need additional home support- confirmed request submitted.  CSW updated inpatient [facility] RN Case Manager that [facility] request had been submitted.    CSW will continue to follow pt in the outpatient setting alongside Community Paramedic through Heart Failure clinic who will see pt once a week to help with medication management and monitor heart failure concerns at home.  Jorge Ny, LCSW Clinical Social Worker Advanced Heart Failure Clinic Desk#: [phone] Cell#: [phone]

## 2021-02-13 NOTE — Plan of Care (Signed)

## 2021-02-14 ENCOUNTER — Inpatient Hospital Stay (HOSPITAL_COMMUNITY)
Admission: RE | Admit: 2021-02-14 | Discharge: 2021-02-14 | Disposition: A | Discharge status: Home / Self Care | Payer: Medicaid Other | Source: Ambulatory Visit

## 2021-02-14 DIAGNOSIS — L089 Local infection of the skin and subcutaneous tissue, unspecified: Secondary | ICD-10-CM | POA: Diagnosis not present

## 2021-02-14 DIAGNOSIS — E875 Hyperkalemia: Secondary | ICD-10-CM | POA: Diagnosis not present

## 2021-02-14 DIAGNOSIS — L02612 Cutaneous abscess of left foot: Secondary | ICD-10-CM | POA: Diagnosis not present

## 2021-02-14 DIAGNOSIS — I96 Gangrene, not elsewhere classified: Secondary | ICD-10-CM | POA: Diagnosis not present

## 2021-02-14 DIAGNOSIS — I10 Essential (primary) hypertension: Secondary | ICD-10-CM | POA: Diagnosis not present

## 2021-02-14 DIAGNOSIS — N179 Acute kidney failure, unspecified: Secondary | ICD-10-CM | POA: Diagnosis not present

## 2021-02-14 DIAGNOSIS — D72829 Elevated white blood cell count, unspecified: Secondary | ICD-10-CM | POA: Diagnosis not present

## 2021-02-14 DIAGNOSIS — E1165 Type 2 diabetes mellitus with hyperglycemia: Secondary | ICD-10-CM | POA: Diagnosis not present

## 2021-02-14 DIAGNOSIS — E871 Hypo-osmolality and hyponatremia: Secondary | ICD-10-CM | POA: Diagnosis not present

## 2021-02-14 DIAGNOSIS — T8130XA Disruption of wound, unspecified, initial encounter: Secondary | ICD-10-CM | POA: Diagnosis not present

## 2021-02-14 DIAGNOSIS — I5022 Chronic systolic (congestive) heart failure: Secondary | ICD-10-CM | POA: Diagnosis not present

## 2021-02-14 LAB — GLUCOSE, CAPILLARY
Glucose-Capillary: 161 mg/dL — ABNORMAL HIGH (ref 70–99)
Glucose-Capillary: 162 mg/dL — ABNORMAL HIGH (ref 70–99)
Glucose-Capillary: 193 mg/dL — ABNORMAL HIGH (ref 70–99)
Glucose-Capillary: 215 mg/dL — ABNORMAL HIGH (ref 70–99)

## 2021-02-14 LAB — CBC
HCT: 27.6 % — ABNORMAL LOW (ref 39.0–52.0)
Hemoglobin: 8.9 g/dL — ABNORMAL LOW (ref 13.0–17.0)
MCH: 27 pg (ref 26.0–34.0)
MCHC: 32.2 g/dL (ref 30.0–36.0)
MCV: 83.6 fL (ref 80.0–100.0)
Platelets: 485 10*3/uL — ABNORMAL HIGH (ref 150–400)
RBC: 3.3 MIL/uL — ABNORMAL LOW (ref 4.22–5.81)
RDW: 16.6 % — ABNORMAL HIGH (ref 11.5–15.5)
WBC: 10.3 10*3/uL (ref 4.0–10.5)
nRBC: 0 % (ref 0.0–0.2)

## 2021-02-14 LAB — SURGICAL PATHOLOGY

## 2021-02-14 LAB — BASIC METABOLIC PANEL
Anion gap: 3 — ABNORMAL LOW (ref 5–15)
BUN: 20 mg/dL (ref 8–23)
CO2: 26 mmol/L (ref 22–32)
Calcium: 8.7 mg/dL — ABNORMAL LOW (ref 8.9–10.3)
Chloride: 107 mmol/L (ref 98–111)
Creatinine, Ser: 1.05 mg/dL (ref 0.61–1.24)
GFR, Estimated: >60 mL/min (ref >60)
Glucose, Bld: 146 mg/dL — ABNORMAL HIGH (ref 70–99)
Potassium: 4.3 mmol/L (ref 3.5–5.1)
Sodium: 136 mmol/L (ref 135–145)

## 2021-02-14 MED ORDER — HYDRALAZINE HCL 50 MG PO TABS
75.0000 mg | ORAL_TABLET | Freq: Three times a day (TID) | ORAL | Status: DC
  Administered 2021-02-14 – 2021-02-15 (×3): 75 mg via ORAL
  Filled 2021-02-14 (×3): qty 1

## 2021-02-14 MED ORDER — ISOSORBIDE MONONITRATE ER 60 MG PO TB24
60.0000 mg | ORAL_TABLET | Freq: Every day | ORAL | Status: DC
  Administered 2021-02-15: 60 mg via ORAL
  Filled 2021-02-14 (×2): qty 1

## 2021-02-14 NOTE — Progress Notes (Signed)
Occupational Therapy Treatment Patient Details Name: Scott Greer MRN: [id number] DOB: 09-30-1956 Today's Date: 02/14/2021    History of present illness 65 y.o. male admitted 4/4 with left foot pain from the wound site.  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 Lajoyce Corners, He noted that the wound opened again and due to ongoing pain in the foot, he decided to go to the ED for further evaluation. Despite foot salvage intervention options patient had a large abscess. 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 PMH: hypertension, hyperlipidemia, T2DM and recent left second toe amputation.   OT comments  Pt making progress with functional goals. Pt upset about not being able to d/c home yet (he continues to refuse SNF). Pt educated on LB ADL/selfcare and ADL mobility safety. OT will continue to follow acutely to maximize level of function and safety  Follow Up Recommendations  SNF;Supervision/Assistance - 24 hour (pt refusing SNF, so will need HH OT)    Equipment Recommendations  3 in 1 bedside commode;Wheelchair (measurements OT);Wheelchair cushion (measurements OT);Other (comment) (RW, reacher)    Recommendations for Other Services      Precautions / Restrictions Precautions Precautions: Fall Required Braces or Orthoses: Other Brace Other Brace: L LE limb guard donned for mobility Restrictions Weight Bearing Restrictions: Yes LUE Weight Bearing: Touch down weight bearing LLE Weight Bearing: Non weight bearing       Mobility Bed Mobility Overal bed mobility: Needs Assistance Bed Mobility: Supine to Sit     Supine to sit: Supervision Sit to supine: Supervision   General bed mobility comments: increased time, use of bed features    Transfers Overall transfer level: Needs assistance Equipment used: Rolling walker (2 wheeled) Transfers: Sit to/from Stand Sit to Stand: Min guard;Supervision         General transfer comment: from EOB<>RW,  needs min guard initially but progressed to Supervision with serial STS training/repetition of safety cues    Balance Overall balance assessment: Needs assistance Sitting-balance support: No upper extremity supported;Feet supported Sitting balance-Leahy Scale: Good Sitting balance - Comments: static sitting and weight shifting no LOB   Standing balance support: Bilateral upper extremity supported;During functional activity Standing balance-Leahy Scale: Poor Standing balance comment: needs RW                           ADL either performed or assessed with clinical judgement   ADL Overall ADL's : Needs assistance/impaired     Grooming: Wash/dry hands;Wash/dry face;Min guard;Standing       Lower Body Bathing: Min guard;Sitting/lateral leans Lower Body Bathing Details (indicate cue type and reason): simulated seated EOB         Toilet Transfer: Min guard;Ambulation;RW;Cueing for safety   Toileting- Clothing Manipulation and Hygiene: Minimal assistance;Sit to/from stand       Functional mobility during ADLs: Min guard;Rolling walker;Cueing for safety General ADL Comments: pt consitently requiring redirectionto task throughout session, no significant LOB but noted needing for correction of balance wtih support at trunk despite B UE support on RW and overall gross strength being [facility].     Vision Patient Visual Report: No change from baseline     Perception     Praxis      Cognition Arousal/Alertness: Awake/alert Behavior During Therapy: WFL for tasks assessed/performed Overall Cognitive Status: No family/caregiver present to determine baseline cognitive functioning  General Comments: decreased safety awareness        Exercises Exercises: Amputee Amputee Exercises Quad Sets: AROM;Left;10 reps;Supine Hip Extension: AROM;Left;10 reps;Sidelying Hip ABduction/ADduction: AROM;Left;15 reps;Supine;Sidelying (x10 ea  position) Hip Flexion/Marching: AROM;Left;10 reps;Supine Chair Push Up: AROM;Both;5 reps;Seated   Shoulder Instructions       General Comments wound vac and shrinker sock in place, pt donned limb guard with assistance; reviewed no lying down with pillow under knee to prevent L knee contracture, HEP handout and car/stair transfers; pt had questions about bathing and referred to RN discharge instruction with these questions    Pertinent Vitals/ Pain       Pain Assessment: 0-10 Pain Score: 5  Faces Pain Scale: Hurts even more Pain Location: L residual limb Pain Descriptors / Indicators: Sore Pain Intervention(s): Monitored during session;Repositioned  Home Living                                          Prior Functioning/Environment              Frequency  Min 2X/week        Progress Toward Goals  OT Goals(current goals can now be found in the care plan section)  Progress towards OT goals: Progressing toward goals  Acute Rehab OT Goals Patient Stated Goal: go home  Plan Discharge plan remains appropriate    Co-evaluation                 AM-PAC OT "6 Clicks" Daily Activity     Outcome Measure   Help from another person eating meals?: None Help from another person taking care of personal grooming?: A Little Help from another person toileting, which includes using toliet, bedpan, or urinal?: A Little Help from another person bathing (including washing, rinsing, drying)?: A Little Help from another person to put on and taking off regular upper body clothing?: None Help from another person to put on and taking off regular lower body clothing?: A Little 6 Click Score: 20    End of Session Equipment Utilized During Treatment: Rolling walker;Gait belt;Other (comment) (L limb guard)  OT Visit Diagnosis: Unsteadiness on feet (R26.81);Pain;Muscle weakness (generalized) (M62.81) Pain - Right/Left: Left Pain - part of body: Leg   Activity  Tolerance Patient tolerated treatment well   Patient Left with call bell/phone within reach;in bed   Nurse Communication          Time: 1610-9604 OT Time Calculation (min): 26 min  Charges: OT General Charges $OT Visit: 1 Visit OT Treatments $Self Care/Home Management : 8-22 mins $Therapeutic Activity: 8-22 mins     Galen Manila 02/14/2021, 2:34 PM

## 2021-02-14 NOTE — Progress Notes (Signed)
Patient is postop day 4 status post left below-knee amputation.  He is sitting in bed comfortable eating breakfast.  His biggest concern is making sure his bills get paid   Wound VAC 0 cc in the canister 1 green check  Plan to discharge to home or other facility.  I discussed with the patient obviously our biggest concern is that he has electricity and appropriate home conditions.  Will need 1 week follow-up in our office

## 2021-02-14 NOTE — TOC Progression Note (Addendum)
Transition of Care [facility]) - Progression Note    Patient Details  Name: Scott Greer MRN: [id number] Date of Birth: [birth year]  Transition of Care [facility]) CM/SW Contact  Epifanio Lesches, RN Phone Number: 02/14/2021, 4:47 PM  Clinical Narrative:               -  4/8 s/p left BKA , from home alone NCM called several home health agencies ( [place], [facility],, [facility], [facility]), all unable to provide home health services 2/2 limited staff vs out of network vs payor source. [facility] reviewing , acceptance pending.  DME, rolling will be provided to pt prior to d/c, W/C will be delivered to pt's home by [employer].  TOC will continue to monitor and assist with TOC needs...  02/15/2021 @  10:40 am [facility] unable to provide home health services  Expected Discharge Plan: Home w Home Health Services Barriers to Discharge: No Barriers Identified  Expected Discharge Plan and Services Expected Discharge Plan: Home w Home Health Services In-house Referral: Clinical Social Work Discharge Planning Services: CM Consult   Living arrangements for the past 2 months: Apartment                 DME Arranged: Dan Humphreys rolling,Wheelchair manual DME Agency: [employer] Date DME Agency Contacted: 02/14/21 Time DME Agency Contacted: [phone] Representative spoke with at DME Agency: Francesco Sor Arranged: RN,PT,Social Work [facility] Agency: [facility] Date HH Agency Contacted: 02/14/21 Time HH Agency Contacted: [phone] Representative spoke with at [facility] Agency: [place]   Social Determinants of Health (SDOH) Interventions    Readmission Risk Interventions Readmission Risk Prevention Plan 06/08/2019  Transportation Screening Complete  PCP or Specialist Appt within 5-7 Days Complete  Home Care Screening Complete  Medication Review (RN CM) Complete  Some recent data might be hidden

## 2021-02-14 NOTE — Progress Notes (Addendum)
Advanced Heart Failure Rounding Note  PCP-Cardiologist: Kristeen Miss, MD   Subjective:    Wants to go home. Concerned about his bills.   Objective:   Weight Range: 90.3 kg Body mass index is 24.88 kg/m.   Vital Signs:   Temp:  [97.7 F (36.5 C)-98.9 F (37.2 C)] 97.9 F (36.6 C) (04/12 0743) Pulse Rate:  [76-86] 83 (04/12 0743) Resp:  [16-19] 19 (04/12 0743) BP: (114-158)/(70-94) 154/72 (04/12 0743) SpO2:  [99 %-100 %] 99 % (04/12 0743) Last BM Date: 02/13/21  Weight change: Filed Weights   02/07/21 0900  Weight: 90.3 kg    Intake/Output:   Intake/Output Summary (Last 24 hours) at 02/14/2021 0920 Last data filed at 02/14/2021 0700 Gross per 24 hour  Intake 1154 ml  Output 1000 ml  Net 154 ml      Physical Exam    General:  No resp difficulty HEENT: Normal Neck: Supple. JVP flat . Carotids 2+ bilat; no bruits. No lymphadenopathy or thyromegaly appreciated. Cor: PMI nondisplaced. Regular rate & rhythm. No rubs, gallops or murmurs. Lungs: Clear Abdomen: Soft, nontender, nondistended. No hepatosplenomegaly. No bruits or masses. Good bowel sounds. Extremities: No cyanosis, clubbing, rash, edema. L BKA Neuro: Alert & orientedx3, cranial nerves grossly intact. moves all 4 extremities w/o difficulty. Affect pleasant   Telemetry   N/a   EKG    N/A  Labs    CBC Recent Labs    02/13/21 0212 02/14/21 0351  WBC 13.4* 10.3  HGB 8.8* 8.9*  HCT 27.3* 27.6*  MCV 83.5 83.6  PLT 480* 485*   Basic Metabolic Panel Recent Labs    71/06/26 0212 02/14/21 0351  NA 133* 136  K 4.6 4.3  CL 105 107  CO2 25 26  GLUCOSE 185* 146*  BUN 25* 20  CREATININE 1.20 1.05  CALCIUM 8.5* 8.7*   Liver Function Tests No results for input(s): AST, ALT, ALKPHOS, BILITOT, PROT, ALBUMIN in the last 72 hours. No results for input(s): LIPASE, AMYLASE in the last 72 hours. Cardiac Enzymes No results for input(s): CKTOTAL, CKMB, CKMBINDEX, TROPONINI in the last 72  hours.  BNP: BNP (last 3 results) Recent Labs    12/25/20 0019  BNP 2,579.0*    ProBNP (last 3 results) No results for input(s): PROBNP in the last 8760 hours.   D-Dimer No results for input(s): DDIMER in the last 72 hours. Hemoglobin A1C No results for input(s): HGBA1C in the last 72 hours. Fasting Lipid Panel No results for input(s): CHOL, HDL, LDLCALC, TRIG, CHOLHDL, LDLDIRECT in the last 72 hours. Thyroid Function Tests No results for input(s): TSH, T4TOTAL, T3FREE, THYROIDAB in the last 72 hours.  Invalid input(s): FREET3  Other results:   Imaging     No results found.   Medications:     Scheduled Medications: . amiodarone  200 mg Oral Daily  . atorvastatin  10 mg Oral Daily  . digoxin  0.125 mg Oral Daily  . docusate sodium  100 mg Oral Daily  . empagliflozin  10 mg Oral Daily  . feeding supplement (GLUCERNA SHAKE)  237 mL Oral TID BM  . gabapentin  300 mg Oral TID  . hydrALAZINE  50 mg Oral Q8H  . insulin aspart  0-15 Units Subcutaneous TID WC  . insulin aspart  0-5 Units Subcutaneous QHS  . isosorbide mononitrate  30 mg Oral Daily  . pantoprazole  40 mg Oral Daily     Infusions:   PRN Medications:  acetaminophen, alum &  mag hydroxide-simeth, guaiFENesin-dextromethorphan, HYDROmorphone (DILAUDID) injection, ondansetron (ZOFRAN) IV, oxyCODONE, phenol     Assessment/Plan  1. L Foot Infection -Osteo. Had IV antibiotics on admit.  -S/P L Transtibial amputation.   -WBC 13.4   2. Chronic biventricular HFrEF -ECHO 12/2020 EF 25-30% RV severely reduced.  CMRI LV EF 23%, RVEF 22%, nonspecific RV insertion LGE.  LHC with 30% LAD. NICM. Possible ETOH +/- PVC - Volume status stable. Hold off on diuretics.   - Continue  digoxin 0.125 mg daily.  - Had recent low output on RHC 2/22 with CI 1.3 . No BB -  Can stay off spiro/entresto with hyperkalemia  - Start jardiance 10 mg daily.    - Increase hydralazine 75 mg three times a day and continue Imdur  30 mg daily   3. Hyperkalemia  Stay off spiro/entresto for now.   4. AKI  Creatinine on admit 1.54, creatinine 1.05 today   5. H/O PVCs In the past he was on amiodarone due to high PVC burden.   -Continue  amio 200 mg daily.   6. ETOH  Discussed cessation.   Difficult to manage. Poor insight and medication errors prior to admit. HH/HF Paramedicine to see at d/c . HFSW following closely due to issues with un paid bills.    Length of Stay: 7  Amy Clegg, NP  02/14/2021, 9:20 AM  Advanced Heart Failure Team Pager [phone] (M-F; 7a - 5p)  Please contact [facility] for night-coverage after hours (5p -7a ) and weekends on [email or web address]  Agree with the above note.   Stable today. Not volume overloaded on exam.    Increase hydralazine to 75 mg tid and Imdur to 60 mg daily.  Add Jardiance 10 mg daily and continue digoxin.   If creatinine and BP stable tomorrow, can probably go on Entresto.   Continue amiodarone for PVCs.   Marca Ancona 02/14/2021 3:03 PM

## 2021-02-14 NOTE — Plan of Care (Signed)

## 2021-02-14 NOTE — Progress Notes (Addendum)
PROGRESS NOTE   Scott Greer  UJW:[id number]    DOB: July 06, 1956    DOA: 02/06/2021  PCP: [name], MD   I have briefly reviewed patients previous medical records in [facility].  Chief Complaint  Patient presents with  . Foot Pain    Brief Narrative:  65 year old male, lives alone, medical history significant for but not limited to hypertension, hyperlipidemia, type II DM, PAD, s/p right first toe amputation, recent left first and second ray amputations on 01/25/2021 by Dr. Lajoyce Corners, missed follow-up appointments and presented with pain, wound dehiscence and purulent drainage from left first and second ray amputation site.  Admitted for PAD with wound dehiscence of the first and second ray amputation with purulent drainage and dry gangrenous changes to the third toe.  Dr. Lajoyce Corners recommended transtibial amputation, patient declined, agreed and initially underwent transmetatarsal amputation 4/6.  However per Dr. Lajoyce Corners, he had extensive abscess that extended up to the hindfoot, strongly recommended BKA which patient initially declined but subsequently consented and underwent left transtibial amputation with application of wound VAC 4/8.  Advanced HF team consulted for assistance with his HF meds.  Patient is medically optimized for discharge home (has repeatedly refused SNF) pending arrangement of home health services and DME by [facility] team which is not complete and has been difficult.   Assessment & Plan:  Principal Problem:   Left foot infection Active Problems:   Essential hypertension   Hyperlipidemia   Noncompliance   Leukocytosis   Thrombocytosis   Hyponatremia   AKI (acute kidney injury) (HCC)   Hyperglycemia due to diabetes mellitus (HCC)   Hypoalbuminemia due to protein-calorie malnutrition (HCC)   Wound dehiscence   Gangrene of toe of left foot (HCC)   Cutaneous abscess of left foot   PAD with wound dehiscence of the recent first and second ray amputation (01/25/2021)  with purulent drainage and dry gangrenous changes to the third toe.  Did not meet sepsis criteria on admission  X-ray of left foot showed gas throughout the soft tissues of the foot but no changes of acute osteomyelitis  Blood cultures x2: Negative.  Treated empirically with IV cefepime and vancomycin from admission and was discontinued 24 hours postop.    Dr. Lajoyce Corners, orthopedics consulted, recommended transtibial amputation, patient declined, agreed to and underwent left transmetatarsal amputation 4/6.  Intraoperatively during TMA, patient had extensive abscess that extended up to the hindfoot, Dr. Lajoyce Corners was apparently unable to close the wound which was packed open.  He strongly recommended BKA  Patient had again declined left BKA, but agreed to surgery and he underwent left transtibial amputation with application of wound VAC 4/8.  Dr. Lajoyce Corners or team to advise management of wound VAC upon discharge.  Pain control and mobilization per therapies.  Nonweightbearing on left lower extremity.  Pain controlled.  Patient continues to decline and adamant on going home with home health services and DME and states that he has neighbors and family who can come by and assist.  Discussed in detail with TOC again regarding arranging for home health services and DME.  As per TOC update just now, rolling walker and wheelchair will be delivered to patient's room prior to discharge.  TOC has called multiple home health agencies and has been unable to secure home health agency thus far.  Patient expressing frustration with inability to go home soon.  Despite extensively explaining him reasons for same i.e. unable to get home health agency to work with him at  discharge, he keeps circling back to the same thing.  Also suspect poor overall insight to his medical issues.  Anemia  Hemoglobin was in the 12 g range during recent hospitalization, admitted with 11.2.  Likely multifactorial due to hemodilution, acute  illness, surgery and chronic disease.  S/p 1 unit PRBC transfusion perioperatively 4/8.  Hemoglobin relatively stable/8.9 today  Follow CBC daily and transfuse if hemoglobin 7 g or less.  Leukocytosis  Presented with WBC of 28.5.  Resolved today.  Reactive thrombocytosis  Stable  Hyponatremia  Could be related to dehydration.  Sodium 126 on admission  Urine osmolarity: 482, urine sodium 12.  Serum osmolarity 290 on admission.   Was stable in the low 130s for several days and has resolved today.  Acute kidney injury complicating stage III a CKD  Baseline creatinine reportedly 1.1-1.3.  Presented with creatinine of 1.54.  Resolved after IV fluids.  Hyperkalemia, mild  Intermittent.  Had normalized after a dose of Lokelma yesterday but potassium up to 5.4 again today.  Changed to renal/carb modified diet with low potassium  Lokelma 5 g twice daily x2 on 4/10.  Resolved and potassium remains normal for the last 2 days.  ?  Type IV RTA  Type II DM with hyperglycemia  Now reasonably controlled on SSI alone, continue  A1c 8.4 on 12/14/2020.  Metformin and Jardiance were on hold.  Jardiance 10 mg daily started back by cardiology today.  Essential hypertension  Started hydralazine and Cardiology of uptitrated to 75 mg 3 times daily, Imdur 60 mg daily  Holding off home Entresto, diuretics etc. due to recent AKI.  Cardiology indicates that since not volume overloaded, holding off diuretics but if creatinine and BP stable tomorrow then possibly to start [facility] tomorrow.  Hyperlipidemia  Continue Lipitor.  Chronic combined systolic and diastolic CHF  2D echo 12/26/2020: LVEF 25-30% with LV global hypokinesis.  Clinically euvolemic.  PTA meds: Amiodarone?  Indication, aspirin 81 mg, digoxin, lisinopril, Entresto, Aldactone and Demadex  During recent admission in March 2022, had been seen by advanced heart failure team.  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  They had recommended above meds along with Jardiance and potassium supplements.   Advanced HF team follow-up much appreciated.  Euvolemic and holding off on diuretics.  Resumed digoxin 0.125 mg daily, Jardiance.  Okay to stay off spironolactone and Entresto due to issues with hyperkalemia but plans to start [facility] tomorrow as noted above.  Hydralazine increased to 75 mg 3 times daily and Imdur 60 daily added.  Amiodarone reinitiated due to prior history of PVCs.  History of PVCs  Cardiology is reinitiated amiodarone 200 mg daily.  Alcohol use disorder  As per history from Cardiology input 4/11.  No overt withdrawal.  Abstinence discussed by them  Moderate protein calorie malnutrition  Continue nutritional supplementation.  Noncompliance with treatment regimen  Patient missed for surgical follow-up.  Counseled extensively regarding importance of compliance with all aspects of medical care and he verbalized understanding  Agree with Cardiology that patient is difficult to manage due to poor insight and medication errors prior to admission.  Home health and heart failure para medicine team to see at discharge.  Heart failure Child psychotherapist following closely due to issues with unpaid bills   Body mass index is 24.88 kg/m.     DVT prophylaxis: SCD's Start: 02/10/21 1543 SCD's Start: 02/08/21 1634 SCDs Start: 02/07/21 0032     Code Status: Full Code Family Communication: Patient declined MDs offer  to speak with his family. Disposition:  Status is: Inpatient  Remains inpatient appropriate because:Inpatient level of care appropriate due to severity of illness   Dispo: The patient is from: Home              Anticipated d/c is to: Home.  Appropriate for SNF but patient adamantly refusing.  DC home pending arrangement of home health services by TOC.              Patient currently is not medically stable to d/c.   Difficult to place patient No        Consultants:    Orthopedics Advanced HF Cardiology.  Procedures:   4/6: Left transmetatarsal amputation by Dr. Lajoyce Corners 4/8: Left transtibial amputation by Dr. Lajoyce Corners  Antimicrobials:    Anti-infectives (From admission, onward)   Start     Dose/Rate Route Frequency Ordered Stop   02/10/21 0815  ceFAZolin (ANCEF) IVPB 2g/100 mL premix        2 g 200 mL/hr over 30 Minutes Intravenous To Short Stay 02/10/21 0808 02/10/21 1200   02/08/21 1500  vancomycin (VANCOREADY) IVPB 1000 mg/200 mL  Status:  Discontinued        1,000 mg 200 mL/hr over 60 Minutes Intravenous Every 12 hours 02/08/21 0846 02/12/21 0841   02/08/21 0600  ceFAZolin (ANCEF) IVPB 2g/100 mL premix        2 g 200 mL/hr over 30 Minutes Intravenous On call to O.R. 02/07/21 1835 02/08/21 1457   02/07/21 2200  vancomycin (VANCOCIN) 1,750 mg in sodium chloride 0.9 % 500 mL IVPB  Status:  Discontinued        1,750 mg 250 mL/hr over 120 Minutes Intravenous Every 24 hours 02/07/21 0429 02/07/21 0828   02/07/21 2200  vancomycin (VANCOREADY) IVPB 1750 mg/350 mL  Status:  Discontinued        1,750 mg 175 mL/hr over 120 Minutes Intravenous Every 24 hours 02/07/21 0828 02/08/21 0846   02/07/21 1000  ceFEPIme (MAXIPIME) 2 g in sodium chloride 0.9 % 100 mL IVPB  Status:  Discontinued        2 g 200 mL/hr over 30 Minutes Intravenous Every 12 hours 02/07/21 0429 02/07/21 0923   02/07/21 0930  ceFEPIme (MAXIPIME) 2 g in sodium chloride 0.9 % 100 mL IVPB  Status:  Discontinued        2 g 200 mL/hr over 30 Minutes Intravenous Every 8 hours 02/07/21 0923 02/12/21 0841   02/06/21 2245  vancomycin (VANCOREADY) IVPB 1500 mg/300 mL        1,500 mg 150 mL/hr over 120 Minutes Intravenous  Once 02/06/21 2239 02/07/21 0231   02/06/21 2245  ceFEPIme (MAXIPIME) 2 g in sodium chloride 0.9 % 100 mL IVPB        2 g 200 mL/hr over 30 Minutes Intravenous  Once 02/06/21 2239 02/06/21 2344        Subjective:  No complaints reported this morning.  Continues to be frustrated  that he cannot go home and does not seem to understand that discharge is delayed due to inability to arrange home health services which the [facility] has been trying very hard.   Objective:   Vitals:   02/13/21 2023 02/14/21 0557 02/14/21 0743 02/14/21 1514  BP: 114/70 (!) 158/94 (!) 154/72 (!) 142/81  Pulse: 86 77 83 73  Resp: 18 18 19 19   Temp: 98.9 F (37.2 C) 97.7 F (36.5 C) 97.9 F (36.6 C) 98.7 F (37.1 C)  TempSrc: Oral  Oral Oral Oral  SpO2: 100% 100% 99% 100%  Weight:      Height:        General exam: Middle-age male, moderately built and nourished sitting up comfortably in bed without distress. Respiratory system: Clear to auscultation.  No increased work of breathing Cardiovascular system: S1 and S2 heard, RRR.  No JVD, murmurs or pedal edema. Gastrointestinal system: Abdomen is nondistended, soft and nontender. No organomegaly or masses felt. Normal bowel sounds heard. Central nervous system: Alert and oriented.. No focal neurological deficits. Extremities: Symmetric 5 x 5 power.  Left transtibial amputation site stump shrinker in place.  Wound VAC still in place. Skin: No rashes, lesions or ulcers Psychiatry: Judgement and insight appear normal. Mood & affect appropriate.     Data Reviewed:   I have personally reviewed following labs and imaging studies   CBC: Recent Labs  Lab 02/12/21 0217 02/13/21 0212 02/14/21 0351  WBC 14.5* 13.4* 10.3  HGB 9.7* 8.8* 8.9*  HCT 30.0* 27.3* 27.6*  MCV 81.5 83.5 83.6  PLT 483* 480* 485*    Basic Metabolic Panel: Recent Labs  Lab 02/12/21 0217 02/13/21 0212 02/14/21 0351  NA 133* 133* 136  K 5.4* 4.6 4.3  CL 105 105 107  CO2 22 25 26   GLUCOSE 167* 185* 146*  BUN 24* 25* 20  CREATININE 1.00 1.20 1.05  CALCIUM 8.8* 8.5* 8.7*    Liver Function Tests: No results for input(s): AST, ALT, ALKPHOS, BILITOT, PROT, ALBUMIN in the last 168 hours.  CBG: Recent Labs  Lab 02/14/21 0644 02/14/21 1159 02/14/21 1512   GLUCAP 161* 162* 193*    Microbiology Studies:   Recent Results (from the past 240 hour(s))  Culture, blood (routine x 2)     Status: None   Collection Time: 02/06/21  8:58 PM   Specimen: BLOOD RIGHT ARM  Result Value Ref Range Status   Specimen Description BLOOD RIGHT ARM  Final   Special Requests   Final    BOTTLES DRAWN AEROBIC AND ANAEROBIC Blood Culture results may not be optimal due to an excessive volume of blood received in culture bottles   Culture   Final    NO GROWTH 5 DAYS Performed at [facility], [address] [place], [place] [address]    Report Status 02/11/2021 FINAL  Final  Culture, blood (routine x 2)     Status: None   Collection Time: 02/06/21  8:58 PM   Specimen: BLOOD LEFT ARM  Result Value Ref Range Status   Specimen Description BLOOD LEFT ARM  Final   Special Requests   Final    BOTTLES DRAWN AEROBIC ONLY Blood Culture results may not be optimal due to an excessive volume of blood received in culture bottles   Culture   Final    NO GROWTH 5 DAYS Performed at [facility], [address] [place], [place] [address]    Report Status 02/11/2021 FINAL  Final  SARS CORONAVIRUS 2 (TAT 6-24 HRS) Nasopharyngeal Nasopharyngeal Swab     Status: None   Collection Time: 02/07/21  1:07 AM   Specimen: Nasopharyngeal Swab  Result Value Ref Range Status   SARS Coronavirus 2 NEGATIVE NEGATIVE Final    Comment: (NOTE) SARS-CoV-2 target nucleic acids are NOT DETECTED.  The SARS-CoV-2 RNA is generally detectable in upper and lower respiratory specimens during the acute phase of infection. Negative results do not preclude SARS-CoV-2 infection, do not rule out co-infections with other pathogens, and should  not be used as the sole basis for treatment or other patient management decisions. Negative results must be combined with clinical observations, patient history, and epidemiological information. The expected result is Negative.  Fact Sheet for  Patients: HairSlick.no  Fact Sheet for Healthcare Providers: quierodirigir.com  This test is not yet approved or cleared by the [place] FDA and  has been authorized for detection and/or diagnosis of SARS-CoV-2 by FDA under an Emergency Use Authorization (EUA). This EUA will remain  in effect (meaning this test can be used) for the duration of the COVID-19 declaration under Se ction 564(b)(1) of the Act, 21 U.S.C. section 360bbb-3(b)(1), unless the authorization is terminated or revoked sooner.  Performed at [facility], [address], [place], [place] [address]   Surgical pcr screen     Status: None   Collection Time: 02/07/21  7:02 PM   Specimen: Nasal Mucosa; Nasal Swab  Result Value Ref Range Status   MRSA, PCR NEGATIVE NEGATIVE Final   Staphylococcus aureus NEGATIVE NEGATIVE Final    Comment: (NOTE) The Xpert SA Assay (FDA approved for NASAL specimens in patients 34 years of age and older), is one component of a comprehensive surveillance program. It is not intended to diagnose infection nor to guide or monitor treatment. Performed at [facility], [address], [place], [place] [address]      Radiology Studies:  No results found.   Scheduled Meds:   . amiodarone  200 mg Oral Daily  . atorvastatin  10 mg Oral Daily  . digoxin  0.125 mg Oral Daily  . docusate sodium  100 mg Oral Daily  . empagliflozin  10 mg Oral Daily  . feeding supplement (GLUCERNA SHAKE)  237 mL Oral TID BM  . gabapentin  300 mg Oral TID  . hydrALAZINE  75 mg Oral Q8H  . insulin aspart  0-15 Units Subcutaneous TID WC  . insulin aspart  0-5 Units Subcutaneous QHS  . [START ON 02/15/2021] isosorbide mononitrate  60 mg Oral Daily  . pantoprazole  40 mg Oral Daily    Continuous Infusions:      LOS: 7 days     Marcellus Scott, MD, [place], [facility]. [facility]    To contact the attending provider between 7A-7P  or the covering provider during after hours 7P-7A, please log into the web site [email or web address] and access using universal [place] password for that web site. If you do not have the password, please call the hospital operator.  02/14/2021, 4:38 PM

## 2021-02-14 NOTE — TOC Transition Note (Incomplete)
Transition of Care [facility]) - CM/SW Discharge Note   Patient Details  Name: Scott Greer MRN: [id number] Date of Birth: Mar 26, 1956  [facility] CM/SW Contact:  Epifanio Lesches, RN Phone Number: 02/14/2021, 7:44 AM   Clinical Narrative:    Patient will DC to: home Anticipated DC date: 02/14/2021 Family notified: yes Transport by: car          - 4/8 S/P left BKA  Per MD patient ready for DC today . RN, patient, and patient's family aware of d/c for today. Pt declined recommendations for SNF placement . Agreeable to home health services. Choice offered. Pt without preference. Referral made with [facility] ...acceptance pending. DME : rolling walker and W/C ...referral made with [employer]. Equipment will be delivered to bedside prior to d/c.  Pt states next door neighbor/ friend to assist with care if needed once d/c. Friend is to provide transportation to home.    Post hospital f/u noted on AVS.  RNCM will sign off for now as intervention is no longer needed. Please consult [place] again if new needs arise.   Final next level of care: Home w Home Health Services Barriers to Discharge: No Barriers Identified   Patient Goals and CMS Choice Patient states their goals for this hospitalization and ongoing recovery are:: "to be able to walk and maintain apartment" CMS Medicare.gov Compare Post Acute Care list provided to:: Other (Comment Required) (Pt wants HH not SNF) Choice offered to / list presented to : NA (Pt does not want SNF prefers [facility])  Discharge Placement                       Discharge Plan and Services In-house Referral: Clinical Social Work Discharge Planning Services: CM Consult            DME Arranged: Dan Humphreys rolling,Wheelchair manual DME Agency: [facility] Date DME Agency Contacted: 02/14/21 Time DME Agency Contacted: [phone] Representative spoke with at DME Agency: Francesco Sor Arranged: RN,PT,Social Work [facility] Agency: [facility] Date HH Agency Contacted: 02/14/21 Time HH Agency Contacted: [phone] Representative spoke with at [facility] Agency: [place]  Social Determinants of Health (SDOH) Interventions     Readmission Risk Interventions Readmission Risk Prevention Plan 06/08/2019  Transportation Screening Complete  PCP or Specialist Appt within 5-7 Days Complete  Home Care Screening Complete  Medication Review (RN CM) Complete  Some recent data might be hidden

## 2021-02-14 NOTE — Progress Notes (Signed)
Physical Therapy Treatment Patient Details Name: Scott Greer MRN: [id number] DOB: 11/08/1955 Today's Date: 02/14/2021    History of Present Illness 65 y.o. male admitted 4/4 with left foot pain from the wound site.  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 Lajoyce Corners, He noted that the wound opened again and due to ongoing pain in the foot, he decided to go to the ED for further evaluation. Despite foot salvage intervention options patient had a large abscess. 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 PMH: hypertension, hyperlipidemia, T2DM and recent left second toe amputation.    PT Comments    Pt received in supine, agreeable to therapy session and with good participation and tolerance for gait/exercise training. Reinforced body positioning at rest and not to use pillow under residual limb to prevent knee/hip flexion contracture, how to don limb guard and when to wear, HEP handout (link: [email or web address]  Access Code: [email or web address]) and activity pacing. Pt continues to benefit from instruction on safety with transfers, gait and stair training and will continue to follow acutely. Continue to recommend SNF however per pt plan is now for home, therefore recommend HHPT.  Follow Up Recommendations  SNF;Supervision/Assistance - 24 hour (aware pt is refusing, will needs 24/7 assist and HHPT)     Equipment Recommendations  Rolling walker with 5" wheels;Wheelchair (measurements PT);Wheelchair cushion (measurements PT)    Recommendations for Other Services       Precautions / Restrictions Precautions Precautions: Fall Required Braces or Orthoses: Other Brace Other Brace: L LE limb guard donned for mobility Restrictions Weight Bearing Restrictions: Yes LLE Weight Bearing: Non weight bearing    Mobility  Bed Mobility Overal bed mobility: Needs Assistance Bed Mobility: Supine to Sit     Supine to sit: Supervision     General bed mobility  comments: increased time, use of bed features    Transfers Overall transfer level: Needs assistance Equipment used: Rolling walker (2 wheeled) Transfers: Sit to/from Stand Sit to Stand: Min guard;Supervision         General transfer comment: from EOB<>RW, needs min guard initially but progressed to Supervision with serial STS training/repetition of safety cues  Ambulation/Gait Ambulation/Gait assistance: Min assist Gait Distance (Feet): 30 Feet Assistive device: Rolling walker (2 wheeled) Gait Pattern/deviations: Step-to pattern   Gait velocity interpretation: <1.31 ft/sec, indicative of household ambulator General Gait Details: improved R foot clearance this date, focus on "press and sway technique" and home RW fitted to proper height   Stairs         General stair comments: visual/verbal demo for technique, pt's family not present in room to practice so printed curb step instructions for him with HEP handout   Wheelchair Mobility    Modified Rankin (Stroke Patients Only)       Balance Overall balance assessment: Needs assistance Sitting-balance support: No upper extremity supported;Feet supported Sitting balance-Leahy Scale: Good Sitting balance - Comments: static sitting and weight shifting no LOB   Standing balance support: Bilateral upper extremity supported Standing balance-Leahy Scale: Poor Standing balance comment: needs RW                            Cognition Arousal/Alertness: Awake/alert Behavior During Therapy: WFL for tasks assessed/performed Overall Cognitive Status: No family/caregiver present to determine baseline cognitive functioning  General Comments: some decreased safety at times but with repetition of cues/practice with transfers pt with fair carryover of safety instruction      Exercises Amputee Exercises Quad Sets: AROM;Left;10 reps;Supine Hip Extension: AROM;Left;10  reps;Sidelying Hip ABduction/ADduction: AROM;Left;15 reps;Supine;Sidelying (x10 ea position) Hip Flexion/Marching: AROM;Left;10 reps;Supine Chair Push Up: AROM;Both;5 reps;Seated    General Comments General comments (skin integrity, edema, etc.): wound vac and shrinker sock in place, pt donned limb guard with assistance; reviewed no lying down with pillow under knee to prevent L knee contracture, HEP handout and car/stair transfers; pt had questions about bathing and referred to RN discharge instruction with these questions      Pertinent Vitals/Pain Pain Assessment: Faces Faces Pain Scale: Hurts even more Pain Location: L residual limb Pain Descriptors / Indicators: Sore Pain Intervention(s): Monitored during session;Repositioned (offered ice but pt defers ice pack)           PT Goals (current goals can now be found in the care plan section) Acute Rehab PT Goals Patient Stated Goal: go home PT Goal Formulation: With patient Time For Goal Achievement: 02/25/21 Potential to Achieve Goals: Good Progress towards PT goals: Progressing toward goals    Frequency    Min 3X/week      PT Plan Current plan remains appropriate       AM-PAC PT "6 Clicks" Mobility   Outcome Measure  Help needed turning from your back to your side while in a flat bed without using bedrails?: None Help needed moving from lying on your back to sitting on the side of a flat bed without using bedrails?: A Little Help needed moving to and from a bed to a chair (including a wheelchair)?: A Little Help needed standing up from a chair using your arms (e.g., wheelchair or bedside chair)?: A Little Help needed to walk in hospital room?: A Little Help needed climbing 3-5 steps with a railing? : A Lot 6 Click Score: 18    End of Session Equipment Utilized During Treatment: Gait belt Activity Tolerance: Patient tolerated treatment well;Patient limited by pain Patient left: with call bell/phone within  reach;in bed;with bed alarm set (pt preparing to eat lunch) Nurse Communication: Mobility status;Other (comment) (has questions about bathing/DC plan) PT Visit Diagnosis: Unsteadiness on feet (R26.81);Other abnormalities of gait and mobility (R26.89);Repeated falls (R29.6);Muscle weakness (generalized) (M62.81);Difficulty in walking, not elsewhere classified (R26.2)     Time: 4128-7867 PT Time Calculation (min) (ACUTE ONLY): 26 min  Charges:  $Gait Training: 8-22 mins $Therapeutic Exercise: 8-22 mins                     Avaiah Stempel P., PTA Acute Rehabilitation Services Pager: [phone] Office: [phone]   [place] 02/14/2021, 12:56 PM

## 2021-02-15 ENCOUNTER — Telehealth (HOSPITAL_COMMUNITY): Payer: Self-pay

## 2021-02-15 ENCOUNTER — Other Ambulatory Visit (HOSPITAL_COMMUNITY): Payer: Self-pay

## 2021-02-15 DIAGNOSIS — L089 Local infection of the skin and subcutaneous tissue, unspecified: Secondary | ICD-10-CM | POA: Diagnosis not present

## 2021-02-15 DIAGNOSIS — I5022 Chronic systolic (congestive) heart failure: Secondary | ICD-10-CM | POA: Diagnosis not present

## 2021-02-15 LAB — BASIC METABOLIC PANEL
Anion gap: 5 (ref 5–15)
BUN: 19 mg/dL (ref 8–23)
CO2: 27 mmol/L (ref 22–32)
Calcium: 9 mg/dL (ref 8.9–10.3)
Chloride: 104 mmol/L (ref 98–111)
Creatinine, Ser: 1.24 mg/dL (ref 0.61–1.24)
GFR, Estimated: >60 mL/min (ref >60)
Glucose, Bld: 140 mg/dL — ABNORMAL HIGH (ref 70–99)
Potassium: 4.1 mmol/L (ref 3.5–5.1)
Sodium: 136 mmol/L (ref 135–145)

## 2021-02-15 LAB — CBC
HCT: 26.9 % — ABNORMAL LOW (ref 39.0–52.0)
Hemoglobin: 8.5 g/dL — ABNORMAL LOW (ref 13.0–17.0)
MCH: 26.5 pg (ref 26.0–34.0)
MCHC: 31.6 g/dL (ref 30.0–36.0)
MCV: 83.8 fL (ref 80.0–100.0)
Platelets: 488 10*3/uL — ABNORMAL HIGH (ref 150–400)
RBC: 3.21 MIL/uL — ABNORMAL LOW (ref 4.22–5.81)
RDW: 16.8 % — ABNORMAL HIGH (ref 11.5–15.5)
WBC: 10.7 10*3/uL — ABNORMAL HIGH (ref 4.0–10.5)
nRBC: 0 % (ref 0.0–0.2)

## 2021-02-15 LAB — GLUCOSE, CAPILLARY
Glucose-Capillary: 194 mg/dL — ABNORMAL HIGH (ref 70–99)
Glucose-Capillary: 179 mg/dL — ABNORMAL HIGH (ref 70–99)

## 2021-02-15 MED ORDER — OXYCODONE-ACETAMINOPHEN 5-325 MG PO TABS: 1.0000 | ORAL_TABLET | ORAL | 0 refills | Status: DC | PRN

## 2021-02-15 MED ORDER — CARVEDILOL 3.125 MG PO TABS: 3.1250 mg | ORAL_TABLET | Freq: Two times a day (BID) | ORAL | Status: DC

## 2021-02-15 MED ORDER — GABAPENTIN 300 MG PO CAPS
300.0000 mg | ORAL_CAPSULE | Freq: Three times a day (TID) | ORAL | 0 refills | Status: DC
  Filled 2021-02-15: qty 90, 30d supply, fill #0

## 2021-02-15 MED ORDER — TORSEMIDE 20 MG PO TABS
20.0000 mg | ORAL_TABLET | Freq: Every day | ORAL | 6 refills | Status: DC
  Filled 2021-02-15: qty 90, 90d supply, fill #0

## 2021-02-15 MED ORDER — SACUBITRIL-VALSARTAN 24-26 MG PO TABS
1.0000 | ORAL_TABLET | Freq: Two times a day (BID) | ORAL | Status: DC
  Filled 2021-02-15 (×2): qty 1

## 2021-02-15 MED ORDER — SACUBITRIL-VALSARTAN 24-26 MG PO TABS
1.0000 | ORAL_TABLET | Freq: Two times a day (BID) | ORAL | 2 refills | Status: DC
  Filled 2021-02-15: qty 60, 30d supply, fill #0

## 2021-02-15 MED ORDER — ISOSORB DINITRATE-HYDRALAZINE 20-37.5 MG PO TABS
1.0000 | ORAL_TABLET | Freq: Three times a day (TID) | ORAL | 0 refills | Status: DC
  Filled 2021-02-15: qty 90, 30d supply, fill #0

## 2021-02-15 MED ORDER — CARVEDILOL 3.125 MG PO TABS
3.1250 mg | ORAL_TABLET | Freq: Two times a day (BID) | ORAL | 2 refills | Status: DC
  Filled 2021-02-15: qty 60, 30d supply, fill #0

## 2021-02-15 NOTE — Discharge Summary (Signed)
Physician Discharge Summary  Scott Greer PRF:[id number] DOB: [birth year] DOA: 02/06/2021  PCP: [name], MD  Admit date: 02/06/2021 Discharge date: 02/15/2021  Admitted From: Home Disposition: Home (Patient declined SNF)  Recommendations for Outpatient Follow-up:  1. Follow up with PCP in 1 week 2. Please follow up on the following pending results: None  Home Health: PT, Social work Equipment/Devices: Conservation officer, nature, manual wheelchair  Discharge Condition: Stable CODE STATUS: Full code Diet recommendation: Heart healthy   Brief/Interim Summary:  Admission HPI written by Bernadette Hoit, DO   HPI: Scott Greer is a 65 y.o. male with medical history significant for hypertension, hyperlipidemia, T2DM and recent left second toe amputation who presents to the emergency department due to left foot pain from the wound site.  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 Sharol Given, he has missed 2 postoperative visits due to oversleeping and not being able to make it to the appointment.  He noted that the wound opened again and due to ongoing pain in the foot, he decided to go to the ED for further evaluation.  He denies fever, chest pain, shortness of breath, nausea or vomiting.   Hospital course:  PAD with wound dehiscence of the recent first and second ray amputation (01/25/2021) with purulent drainage and dry gangrenous changes to the third toe.  Did not meet sepsis criteria on admission  X-ray of left foot showed gas throughout the soft tissues of the foot but no changes of acute osteomyelitis  Blood cultures x2: Negative.  Treated empirically with IV cefepime and vancomycin from admission and was discontinued 24 hours postop.    Dr. Sharol Given, orthopedics consulted, recommended transtibial amputation, patient declined, agreed to and underwent left transmetatarsal amputation 4/6.  Intraoperatively during TMA, patient had extensive  abscess that extended up to the hindfoot, Dr. Sharol Given was apparently unable to close the wound which was packed open.  He strongly recommended BKA  Patient had again declined left BKA, but agreed to surgery and he underwent left transtibial amputation with application of wound VAC 4/8.   Pain control and mobilization per therapies.  Nonweightbearing on left lower extremity.  Pain controlled.  Patient continues to decline and adamant on going home with home health services and DME and states that he has neighbors and family who can come by and assist.  Discussed in detail with TOC again regarding arranging for home health services and DME.  As per TOC update just now, rolling walker and wheelchair will be delivered to patient's room prior to discharge.  TOC has called multiple home health agencies and secured a home health agency.  Anemia  Hemoglobin was in the 12 g range during recent hospitalization, admitted with 11.2.  Likely multifactorial due to hemodilution, acute illness, surgery and chronic disease.  S/p 1 unit PRBC transfusion perioperatively 4/8.  Hemoglobin relatively stable/8.9 today  Follow CBC daily and transfuse if hemoglobin 7 g or less.  Leukocytosis  Presented with WBC of 28.5. Improved.  Reactive thrombocytosis  Stable  Hyponatremia  Could be related to dehydration.  Sodium 126 on admission  Urine osmolarity: 482, urine sodium 12.  Serum osmolarity 290 on admission.   Was stable in the low 130s for several days and has resolved.  Acute kidney injury complicating stage III a CKD  Baseline creatinine reportedly 1.1-1.3.  Presented with creatinine of 1.54.  Resolved after IV fluids.  Hyperkalemia, mild  Intermittent.  Had normalized  after a dose of Lokelma yesterday but potassium up to 5.4 again today.  Changed to renal/carb modified diet with low potassium  Lokelma 5 g twice daily x2 on 4/10.  Resolved and potassium remains normal.  Type II  DM with hyperglycemia  Now reasonably controlled on SSI alone, continue  A1c 8.4 on 12/14/2020.  Metformin and Jardiance were on hold.  Jardiance 10 mg daily started back. Resume on discharge.  Essential hypertension  Started hydralazine and Cardiology of uptitrated to 75 mg 3 times daily, Imdur 60 mg daily  Holding off home Entresto, diuretics etc. due to recent AKI.   Entresto restarted at 24-73m dose. Bidil started on discharge as well.  Hyperlipidemia  Continue Lipitor.  Chronic combined systolic and diastolic CHF  2D echo 27/62/2633 LVEF 25-30% with LV global hypokinesis.  Clinically euvolemic.  PTA meds: Amiodarone?  Indication, aspirin 81 mg, digoxin, lisinopril, Entresto, Aldactone and Demadex  During recent admission in March 2022, had been seen by advanced heart failure team.  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  They had recommended above meds along with Jardiance and potassium supplements.   Advanced HF team follow-up much appreciated.  Euvolemic and holding off on diuretics.  Resumed digoxin 0.125 mg daily, Jardiance.  Spironolactone discontinued on discharge. Continue amiodarone and Entresto as mentioned above.  History of PVCs  Continue amiodarone 200 mg daily.  Alcohol use disorder  As per history from Cardiology input 4/11.  No overt withdrawal.  Abstinence discussed by them  Moderate protein calorie malnutrition  Continue nutritional supplementation.   Discharge Diagnoses:  Principal Problem:   Left foot infection Active Problems:   Essential hypertension   Hyperlipidemia   Noncompliance   Leukocytosis   Thrombocytosis   Hyponatremia   AKI (acute kidney injury) ([facility]   Hyperglycemia due to diabetes mellitus (HCC)   Hypoalbuminemia due to protein-calorie malnutrition (HCC)   Wound dehiscence   Gangrene of toe of left foot (HCC)   Cutaneous abscess of left foot    Discharge Instructions  Discharge Instructions     Negative Pressure Wound Therapy - Incisional   Complete by: As directed    Show patient how to attach prevena vac. Should call office if alarms     Allergies as of 02/15/2021      Reactions   Bee Venom Hives, Itching, Swelling      Medication List    STOP taking these medications   lisinopril 10 MG tablet Commonly known as: ZESTRIL   Potassium Chloride ER 20 MEQ Tbcr   sacubitril-valsartan 97-103 MG Commonly known as: ENTRESTO Replaced by: sacubitril-valsartan 24-26 MG   spironolactone 25 MG tablet Commonly known as: ALDACTONE     TAKE these medications   amiodarone 200 MG tablet Commonly known as: PACERONE Take 1 tablet (200 mg total) by mouth daily.   aspirin 81 MG chewable tablet Chew 1 tablet (81 mg total) by mouth daily.   atorvastatin 10 MG tablet Commonly known as: Lipitor Take 1 tablet (10 mg total) by mouth daily.   carvedilol 3.125 MG tablet Commonly known as: COREG Take 1 tablet (3.125 mg total) by mouth 2 (two) times daily with a meal.   digoxin 0.125 MG tablet Commonly known as: LANOXIN Take 1 tablet (0.125 mg total) by mouth daily.   empagliflozin 10 MG Tabs tablet Commonly known as: JARDIANCE Take 1 tablet (10 mg total) by mouth daily.   gabapentin 300 MG capsule Commonly known as: NEURONTIN Take 1 capsule (300 mg total) by  mouth 3 (three) times daily.   isosorbide-hydrALAZINE 20-37.5 MG tablet Commonly known as: BIDIL Take 1 tablet by mouth 3 (three) times daily.   metFORMIN 500 MG tablet Commonly known as: Glucophage Take 1 tablet (500 mg total) by mouth 2 (two) times daily with a meal.   oxyCODONE-acetaminophen 5-325 MG tablet Commonly known as: PERCOCET/ROXICET Take 1 tablet by mouth every 4 (four) hours as needed for severe pain.   sacubitril-valsartan 24-26 MG Commonly known as: ENTRESTO Take 1 tablet by mouth 2 (two) times daily. Replaces: sacubitril-valsartan 97-103 MG   torsemide 20 MG tablet Commonly known as:  DEMADEX Take 1 tablet (20 mg total) by mouth daily. What changed: how much to take   True Metrix Blood Glucose Test test strip Generic drug: glucose blood Use as instructed   True Metrix Meter w/Device Kit Use as directed   TRUEplus Lancets 28G Misc Use as directed   Vitamin D (Ergocalciferol) 1.25 MG (50000 UNIT) Caps capsule Commonly known as: DRISDOL Take 1 capsule (50,000 Units total) by mouth every 7 (seven) days.            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  A rolling walker  will not resolve  issue with performing activities of daily living. A wheelchair will allow patient to safely perform daily activities. Patient is not able to propel themselves in the home using a standard weight wheelchair due to weakness. Patient can self propel in the lightweight wheelchair. Length of need 12 months. Accessories: elevating leg rests (ELRs), wheel locks, extensions and anti-tippers.   02/14/21 0734   02/14/21 0731  For home use only DME Walker rolling  Once       Question Answer Comment  Walker: With Piqua   Patient needs a walker to treat with the following condition Weakness      02/14/21 0734          Follow-up [place]. Go on 03/30/2021.   Why: Post hospital follow up scheduled for 03/30/2021 at 9:30 am with Dr. Karle Plumber Contact information: [place] [address] [phone]       [place], [place], NP In 1 week.   Specialty: Orthopedic Surgery Contact information: [place] [address] [phone]        Care, [facility] Follow up.   Specialty: Home Health Services Why: home  health services (  PT, OT and SW ) arranged, start of care within 48 hours post discharge Contact information: [address] [place] [address] [phone]              Allergies  Allergen Reactions  . Bee Venom Hives, Itching and Swelling    Consultations:  Cardiology  Orthopedic surgery   Procedures/Studies: DG Foot Complete Left  Result Date: 02/06/2021 CLINICAL DATA:  Wound dehiscence EXAM: LEFT FOOT - COMPLETE 3+ VIEW COMPARISON:  None. FINDINGS: Prior 1st and 2nd transmetatarsal amputation. Gas noted throughout the soft tissues of the forefoot. No bone destruction to suggest osteomyelitis. No acute fracture, subluxation or dislocation. IMPRESSION: Gas throughout the soft  tissues of the foot. No changes of acute osteomyelitis by plain film. Electronically Signed   By: Rolm Baptise M.D.   On: 02/06/2021 21:29       Subjective: No issues. Eager to discharge. No chest pain or dyspnea  Discharge Exam: Vitals:   02/15/21 0500 02/15/21 0756  BP: (!) 168/65 (!) 158/82  Pulse: 70 80  Resp: 18 17  Temp: 97.6 F (36.4 C) 98.5 F (36.9 C)  SpO2: 100% 99%   Vitals:   02/14/21 1514 02/14/21 1941 02/15/21 0500 02/15/21 0756  BP: (!) 142/81 (!) 154/85 (!) 168/65 (!) 158/82  Pulse: 73 87 70 80  Resp: 19 16 18 17   Temp: 98.7 F (37.1 C) (!) 97.5 F (36.4 C) 97.6 F (36.4 C) 98.5 F (36.9 C)  TempSrc: Oral Oral Oral Oral  SpO2: 100% 100% 100% 99%  Weight:      Height:        General: Pt is alert, awake, not in acute distress Cardiovascular: RRR, S1/S2 +, no rubs, no gallops Respiratory: CTA bilaterally, no wheezing, no rhonchi Abdominal: Soft, NT, ND, bowel sounds + Extremities: no edema, no cyanosis    The results of significant diagnostics from this hospitalization (including imaging, microbiology, ancillary and laboratory) are listed below for reference.     Microbiology: Recent Results (from the past 240 hour(s))  Culture, blood  (routine x 2)     Status: None   Collection Time: 02/06/21  8:58 PM   Specimen: BLOOD RIGHT ARM  Result Value Ref Range Status   Specimen Description BLOOD RIGHT ARM  Final   Special Requests   Final    BOTTLES DRAWN AEROBIC AND ANAEROBIC Blood Culture results may not be optimal due to an excessive volume of blood received in culture bottles   Culture   Final    NO GROWTH 5 DAYS Performed at [facility], [place] [address], [place], [place] [address]    Report Status 02/11/2021 FINAL  Final  Culture, blood (routine x 2)     Status: None   Collection Time: 02/06/21  8:58 PM   Specimen: BLOOD LEFT ARM  Result Value Ref Range Status   Specimen Description BLOOD LEFT ARM  Final   Special Requests   Final    BOTTLES DRAWN AEROBIC ONLY Blood Culture results may not be optimal due to an excessive volume of blood received in culture bottles   Culture   Final    NO GROWTH 5 DAYS Performed at [facility], [place] [address] [place], [place] [address]    Report Status 02/11/2021 FINAL  Final  SARS CORONAVIRUS 2 (TAT 6-24 HRS) Nasopharyngeal Nasopharyngeal Swab     Status: None   Collection Time: 02/07/21  1:07 AM   Specimen: Nasopharyngeal Swab  Result Value Ref Range Status   SARS Coronavirus 2 NEGATIVE NEGATIVE Final    Comment: (NOTE) SARS-CoV-2 target nucleic acids are NOT DETECTED.  The SARS-CoV-2 RNA is generally detectable in upper and lower respiratory specimens during the acute phase of infection. Negative results do not preclude SARS-CoV-2 infection, do not rule out co-infections with other pathogens, and should not be used as the sole basis for treatment or other patient management decisions. Negative results must be combined with clinical observations, patient history, and epidemiological information. The expected result is Negative.  Fact Sheet for Patients: SugarRoll.be  Fact Sheet for Healthcare  Providers: https://www.woods-mathews.com/  This test is not yet approved or cleared by the [place]  FDA and  has been authorized for detection and/or diagnosis of SARS-CoV-2 by FDA under an Emergency Use Authorization (EUA). This EUA will remain  in effect (meaning this test can be used) for the duration of the COVID-19 declaration under Se ction 564(b)(1) of the Act, 21 U.S.C. section 360bbb-3(b)(1), unless the authorization is terminated or revoked sooner.  Performed at [facility], [place] [address] [place], [place] [address]   Surgical pcr screen     Status: None   Collection Time: 02/07/21  7:02 PM   Specimen: Nasal Mucosa; Nasal Swab  Result Value Ref Range Status   MRSA, PCR NEGATIVE NEGATIVE Final   Staphylococcus aureus NEGATIVE NEGATIVE Final    Comment: (NOTE) The Xpert SA Assay (FDA approved for NASAL specimens in patients 76 years of age and older), is one component of a comprehensive surveillance program. It is not intended to diagnose infection nor to guide or monitor treatment. Performed at [facility], [place] [address] [place], [place] [address]      Labs: BNP (last 3 results) Recent Labs    12/25/20 0019  BNP 8,295.6*   Basic Metabolic Panel: Recent Labs  Lab 02/11/21 1737 02/12/21 0217 02/13/21 0212 02/14/21 0351 02/15/21 0146  NA 131* 133* 133* 136 136  K 4.8 5.4* 4.6 4.3 4.1  CL 102 105 105 107 104  CO2 24 22 25 26 27   GLUCOSE 141* 167* 185* 146* 140*  BUN 27* 24* 25* 20 19  CREATININE 1.03 1.00 1.20 1.05 1.24  CALCIUM 8.7* 8.8* 8.5* 8.7* 9.0   Liver Function Tests: No results for input(s): AST, ALT, ALKPHOS, BILITOT, PROT, ALBUMIN in the last 168 hours. No results for input(s): LIPASE, AMYLASE in the last 168 hours. No results for input(s): AMMONIA in the last 168 hours. CBC: Recent Labs  Lab 02/11/21 0118 02/12/21 0217 02/13/21 0212 02/14/21 0351 02/15/21 0146  WBC 18.9* 14.5* 13.4* 10.3 10.7*  HGB  9.7* 9.7* 8.8* 8.9* 8.5*  HCT 29.8* 30.0* 27.3* 27.6* 26.9*  MCV 81.4 81.5 83.5 83.6 83.8  PLT 436* 483* 480* 485* 488*   Cardiac Enzymes: No results for input(s): CKTOTAL, CKMB, CKMBINDEX, TROPONINI in the last 168 hours. BNP: Invalid input(s): POCBNP CBG: Recent Labs  Lab 02/14/21 1159 02/14/21 1512 02/14/21 2120 02/15/21 0631 02/15/21 1130  GLUCAP 162* 193* 215* 194* 179*   D-Dimer No results for input(s): DDIMER in the last 72 hours. Hgb A1c No results for input(s): HGBA1C in the last 72 hours. Lipid Profile No results for input(s): CHOL, HDL, LDLCALC, TRIG, CHOLHDL, LDLDIRECT in the last 72 hours. Thyroid function studies No results for input(s): TSH, T4TOTAL, T3FREE, THYROIDAB in the last 72 hours.  Invalid input(s): FREET3 Anemia work up No results for input(s): VITAMINB12, FOLATE, FERRITIN, TIBC, IRON, RETICCTPCT in the last 72 hours. Urinalysis    Component Value Date/Time   COLORURINE STRAW (A) 12/26/2020 1000   APPEARANCEUR CLEAR 12/26/2020 1000   LABSPEC 1.005 12/26/2020 1000   PHURINE 5.0 12/26/2020 1000   GLUCOSEU NEGATIVE 12/26/2020 1000   HGBUR NEGATIVE 12/26/2020 1000   BILIRUBINUR NEGATIVE 12/26/2020 1000   KETONESUR NEGATIVE 12/26/2020 1000   PROTEINUR 30 (A) 12/26/2020 1000   NITRITE NEGATIVE 12/26/2020 1000   LEUKOCYTESUR NEGATIVE 12/26/2020 1000   Sepsis Labs Invalid input(s): PROCALCITONIN,  WBC,  LACTICIDVEN Microbiology Recent Results (from the past 240 hour(s))  Culture, blood (routine x 2)     Status: None   Collection Time: 02/06/21  8:58 PM  Specimen: BLOOD RIGHT ARM  Result Value Ref Range Status   Specimen Description BLOOD RIGHT ARM  Final   Special Requests   Final    BOTTLES DRAWN AEROBIC AND ANAEROBIC Blood Culture results may not be optimal due to an excessive volume of blood received in culture bottles   Culture   Final    NO GROWTH 5 DAYS Performed at [facility], [place] [address], [place], [place] [address]     Report Status 02/11/2021 FINAL  Final  Culture, blood (routine x 2)     Status: None   Collection Time: 02/06/21  8:58 PM   Specimen: BLOOD LEFT ARM  Result Value Ref Range Status   Specimen Description BLOOD LEFT ARM  Final   Special Requests   Final    BOTTLES DRAWN AEROBIC ONLY Blood Culture results may not be optimal due to an excessive volume of blood received in culture bottles   Culture   Final    NO GROWTH 5 DAYS Performed at [facility], [place] [address] [place], [place]  [address]    Report Status 02/11/2021 FINAL  Final  SARS CORONAVIRUS 2 (TAT 6-24 HRS) Nasopharyngeal Nasopharyngeal Swab     Status: None   Collection Time: 02/07/21  1:07 AM   Specimen: Nasopharyngeal Swab  Result Value Ref Range Status   SARS Coronavirus 2 NEGATIVE NEGATIVE Final    Comment: (NOTE) SARS-CoV-2 target nucleic acids are NOT DETECTED.  The SARS-CoV-2 RNA is generally detectable in upper and lower respiratory specimens during the acute phase of infection. Negative results do not preclude SARS-CoV-2 infection, do not rule out co-infections with other pathogens, and should not be used as the sole basis for treatment or other patient management decisions. Negative results must be combined with clinical observations, patient history, and epidemiological information. The expected result is Negative.  Fact Sheet for Patients: SugarRoll.be  Fact Sheet for Healthcare Providers: https://www.woods-mathews.com/  This test is not yet approved or cleared by the [place] FDA and  has been authorized for detection and/or diagnosis of SARS-CoV-2 by FDA under an Emergency Use Authorization (EUA). This EUA will remain  in effect (meaning this test can be used) for the duration of the COVID-19 declaration under Se ction 564(b)(1) of the Act, 21 U.S.C. section 360bbb-3(b)(1), unless the authorization is terminated or revoked sooner.  Performed at [facility], [place] [address], [place], [place] [address]   Surgical pcr screen     Status: None   Collection Time: 02/07/21  7:02 PM   Specimen: Nasal Mucosa; Nasal Swab  Result Value Ref Range Status   MRSA, PCR NEGATIVE NEGATIVE Final   Staphylococcus aureus NEGATIVE NEGATIVE Final    Comment: (NOTE) The Xpert SA Assay (FDA approved for NASAL specimens in patients 31 years of age and older), is one component of a comprehensive surveillance program. It is not intended to diagnose infection nor to guide or monitor treatment. Performed at [facility], [place] [address], [place], [place] [address]      Time coordinating discharge: 35 minutes  SIGNED:   Cordelia Poche, MD [facility] 02/15/2021, 2:24 PM

## 2021-02-15 NOTE — Progress Notes (Addendum)
   HF Meds for D/C  - Aspirin 81 mg daily -Entresto 24-26 mg twice a day  -Digoxin 0.125 mg daily -Jardiance 10 mg daily - Bidil 1 tab three times a day  - Coreg 3.125 mg twice a day -Amiodarone 200 mg daily - Torsemide 20 mg daily  He has Medicaid.   We will set up HF follow up has been set up.   Alyssha Housh NP-C  1:59 PM

## 2021-02-15 NOTE — Consult Note (Signed)
   [facility] Inpatient Consult   02/15/2021  STORM SOVINE [birth year] [id number]   Patient chart reviewed for potential [facility] Care Management [facility] CM) services due to high unplanned readmission risk score. Per chart review, patient is eligible for Managed Medicaid community care coordination services. Referral placed to MM program during prior hospital admission.   Plan: Patient to discharge home with home health services. Outpatient CCM will be notified of patient disposition.   Of note, [facility] Care Management services does not replace or interfere with any services that are arranged by inpatient case management or social work.  Christophe Louis, MSN, RN [facility] Liaison Nurse Mobile Phone [phone]  Toll free office [phone]

## 2021-02-15 NOTE — Telephone Encounter (Signed)
Talked to Scott Greer who reports he is now home from the hospital. He reports his pain is under control right now. I will go out tomorrow to assist with medications and fill his pill box to ensure he is getting his medicines properly. Scott Greer agreed with plan.

## 2021-02-15 NOTE — Progress Notes (Signed)
Physical Therapy Treatment Patient Details Name: Scott Greer MRN: [id number] DOB: 08/09/1956 Today's Date: 02/15/2021    History of Present Illness Pt is 65 y.o. male admitted 4/4 with left foot pain from the wound site.  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 Lajoyce Corners, He noted that the wound opened again and due to ongoing pain in the foot, he decided to go to the ED for further evaluation. Despite foot salvage intervention options patient had a large abscess. 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 PMH: hypertension, hyperlipidemia, T2DM and recent left second toe amputation.    PT Comments    Pt making gradual progress.  Did exhibit some decreased safety awareness -  Spent increased time educating on safety and importance in his recovery to eventually get a prosthetic.  Requiring min A for stairs and min guard with ambulation.  Pt continues to decline SNF - recommend 24 hr supervision and max HH services.   Follow Up Recommendations  SNF;Supervision/Assistance - 24 hour (Pt declining SNF - recommend max HH services)     Equipment Recommendations  Rolling walker with 5" wheels;Wheelchair (measurements PT);Wheelchair cushion (measurements PT)    Recommendations for Other Services       Precautions / Restrictions Precautions Precautions: Fall Required Braces or Orthoses: Other Brace Other Brace: L LE limb guard donned for mobility Restrictions LLE Weight Bearing: Non weight bearing    Mobility  Bed Mobility Overal bed mobility: Needs Assistance Bed Mobility: Supine to Sit;Sit to Supine     Supine to sit: Independent Sit to supine: Independent        Transfers Overall transfer level: Needs assistance Equipment used: Rolling walker (2 wheeled) Transfers: Sit to/from Stand Sit to Stand: Supervision         General transfer comment: Performed x 3 with min guard progressed to supervision; good use of hands on bed to  stand  Ambulation/Gait Ambulation/Gait assistance: Min guard Gait Distance (Feet): 40 Feet (40'x2) Assistive device: Rolling walker (2 wheeled)       General Gait Details: Hop to pattern on R LE ; occasionally got too close to RW needing cues for proximity/smaller step   Stairs Stairs: Yes Stairs assistance: Min assist;Min guard Stair Management: Backwards;With walker   General stair comments: Performed 4" platform step to mimic home set-up with min guard.  However, pt reports his is much higher.  Performed on 6" step with min A to steady.  Educated to have assist with this transfer at all times   Wheelchair Mobility    Modified Rankin (Stroke Patients Only)       Balance Overall balance assessment: Needs assistance Sitting-balance support: No upper extremity supported;Feet supported Sitting balance-Leahy Scale: Normal     Standing balance support: Bilateral upper extremity supported;During functional activity Standing balance-Leahy Scale: Poor Standing balance comment: Uses RW for mobility but was able to balance long enough on R LE to raise walker to platform; could not maintain without support for long                            Cognition Arousal/Alertness: Awake/alert Behavior During Therapy: WFL for tasks assessed/performed Overall Cognitive Status: No family/caregiver present to determine baseline cognitive functioning  General Comments: decreased safety awareness- talking about doing what he can on his own, drinking alcohol when he gets home      Exercises Amputee Exercises Quad Sets: AROM;Left;10 reps;Supine Hip Extension: AROM;Both;5 reps;Prone Hip ABduction/ADduction: AROM;Left;10 reps;Sidelying Knee Flexion: AROM;Left;10 reps;Seated Knee Extension: AROM;Left;10 reps;Seated Straight Leg Raises: AROM;Left;5 reps;Supine    General Comments General comments (skin integrity, edema, etc.): Pt  educated on safety- recommendations for assist with all upright mobility, use of w/c (delivered to home) for longer distance or if alone. Pt provided teach back to wear limb protector when moving around. Also educated on positioning to prevent contractures, covered HEP, and answered basic questions that pt had about future prosthetic as able. On HEP - pt had supine hip extension by pushing residual limb into bed - advised against this until incision heals more - switched to quad sets. Pt did express that he planned to drink alcohol upon return home - RN that was observing PT educated on alcohol not safe with blood thinner and pain meds - also , this PT notified pt's nurse      Pertinent Vitals/Pain Pain Assessment: Faces Faces Pain Scale: Hurts a little bit Pain Location: L residual limb Pain Descriptors / Indicators: Sore Pain Intervention(s): Limited activity within patient's tolerance;Monitored during session;Repositioned    Home Living                      Prior Function            PT Goals (current goals can now be found in the care plan section) Acute Rehab PT Goals Patient Stated Goal: go home PT Goal Formulation: With patient Time For Goal Achievement: 02/25/21 Potential to Achieve Goals: Good Progress towards PT goals: Progressing toward goals    Frequency    Min 5X/week      PT Plan Frequency needs to be updated (declining SNF)    Co-evaluation              AM-PAC PT "6 Clicks" Mobility   Outcome Measure  Help needed turning from your back to your side while in a flat bed without using bedrails?: None Help needed moving from lying on your back to sitting on the side of a flat bed without using bedrails?: None Help needed moving to and from a bed to a chair (including a wheelchair)?: A Little Help needed standing up from a chair using your arms (e.g., wheelchair or bedside chair)?: A Little Help needed to walk in hospital room?: A Little Help needed  climbing 3-5 steps with a railing? : A Little 6 Click Score: 20    End of Session Equipment Utilized During Treatment: Gait belt Activity Tolerance: Patient tolerated treatment well;Patient limited by pain Patient left: with call bell/phone within reach;in bed;with bed alarm set Nurse Communication: Mobility status (pt reports to drink alcohol at home-PT advised against but recommended RN reinforce) PT Visit Diagnosis: Unsteadiness on feet (R26.81);Other abnormalities of gait and mobility (R26.89);Repeated falls (R29.6);Muscle weakness (generalized) (M62.81);Difficulty in walking, not elsewhere classified (R26.2)     Time: 4010-2725 PT Time Calculation (min) (ACUTE ONLY): 35 min  Charges:  $Gait Training: 8-22 mins $Therapeutic Exercise: 8-22 mins                     Anise Salvo, PT Acute Rehab Services Pager [phone] [facility] [phone]     Scott Greer 02/15/2021, 4:26 PM

## 2021-02-15 NOTE — Progress Notes (Signed)
Discharged pt to home, picked up by care giver. Wound vac switched to Prevena wound vac. Discharge instructions given to pt.

## 2021-02-15 NOTE — Progress Notes (Addendum)
Advanced Heart Failure Rounding Note  PCP-Cardiologist: Kristeen Miss, MD   Subjective:    Wants to go home. Denies SOB.    Objective:   Weight Range: 90.3 kg Body mass index is 24.88 kg/m.   Vital Signs:   Temp:  [97.5 F (36.4 C)-98.7 F (37.1 C)] 98.5 F (36.9 C) (04/13 0756) Pulse Rate:  [70-87] 80 (04/13 0756) Resp:  [16-19] 17 (04/13 0756) BP: (142-168)/(65-85) 158/82 (04/13 0756) SpO2:  [99 %-100 %] 99 % (04/13 0756) Last BM Date: 02/13/21  Weight change: Filed Weights   02/07/21 0900  Weight: 90.3 kg    Intake/Output:   Intake/Output Summary (Last 24 hours) at 02/15/2021 1240 Last data filed at 02/15/2021 1100 Gross per 24 hour  Intake 240 ml  Output 2700 ml  Net -2460 ml      Physical Exam    General: No resp difficulty HEENT: normal Neck: supple. no JVD. Carotids 2+ bilat; no bruits. No lymphadenopathy or thryomegaly appreciated. Cor: PMI nondisplaced. Regular rate & rhythm. No rubs, gallops or murmurs. Lungs: clear Abdomen: soft, nontender, nondistended. No hepatosplenomegaly. No bruits or masses. Good bowel sounds. Extremities: no cyanosis, clubbing, rash, edema. LBKA VAC in place Neuro: alert & orientedx3, cranial nerves grossly intact. moves all 4 extremities w/o difficulty. Affect pleasant    Telemetry   N/a   EKG    N/A  Labs    CBC Recent Labs    02/14/21 0351 02/15/21 0146  WBC 10.3 10.7*  HGB 8.9* 8.5*  HCT 27.6* 26.9*  MCV 83.6 83.8  PLT 485* 488*   Basic Metabolic Panel Recent Labs    24/58/09 0351 02/15/21 0146  NA 136 136  K 4.3 4.1  CL 107 104  CO2 26 27  GLUCOSE 146* 140*  BUN 20 19  CREATININE 1.05 1.24  CALCIUM 8.7* 9.0   Liver Function Tests No results for input(s): AST, ALT, ALKPHOS, BILITOT, PROT, ALBUMIN in the last 72 hours. No results for input(s): LIPASE, AMYLASE in the last 72 hours. Cardiac Enzymes No results for input(s): CKTOTAL, CKMB, CKMBINDEX, TROPONINI in the last 72  hours.  BNP: BNP (last 3 results) Recent Labs    12/25/20 0019  BNP 2,579.0*    ProBNP (last 3 results) No results for input(s): PROBNP in the last 8760 hours.   D-Dimer No results for input(s): DDIMER in the last 72 hours. Hemoglobin A1C No results for input(s): HGBA1C in the last 72 hours. Fasting Lipid Panel No results for input(s): CHOL, HDL, LDLCALC, TRIG, CHOLHDL, LDLDIRECT in the last 72 hours. Thyroid Function Tests No results for input(s): TSH, T4TOTAL, T3FREE, THYROIDAB in the last 72 hours.  Invalid input(s): FREET3  Other results:   Imaging    No results found.   Medications:     Scheduled Medications: . amiodarone  200 mg Oral Daily  . atorvastatin  10 mg Oral Daily  . digoxin  0.125 mg Oral Daily  . docusate sodium  100 mg Oral Daily  . empagliflozin  10 mg Oral Daily  . feeding supplement (GLUCERNA SHAKE)  237 mL Oral TID BM  . gabapentin  300 mg Oral TID  . hydrALAZINE  75 mg Oral Q8H  . insulin aspart  0-15 Units Subcutaneous TID WC  . insulin aspart  0-5 Units Subcutaneous QHS  . isosorbide mononitrate  60 mg Oral Daily  . pantoprazole  40 mg Oral Daily    Infusions:   PRN Medications: acetaminophen, alum & mag hydroxide-simeth,  guaiFENesin-dextromethorphan, HYDROmorphone (DILAUDID) injection, ondansetron (ZOFRAN) IV, oxyCODONE, phenol     Assessment/Plan  1. L Foot Infection -Osteo. Had IV antibiotics on admit.  -S/P L Transtibial amputation.   -WBC 10.7   2. Chronic biventricular HFrEF -ECHO 12/2020 EF 25-30% RV severely reduced.  CMRI LV EF 23%, RVEF 22%, nonspecific RV insertion LGE.  LHC with 30% LAD. NICM. Possible ETOH +/- PVC - Volume status stable. Hold off on diuretics.   - Continue  digoxin 0.125 mg daily.  - Had recent low output on RHC 2/22 with CI 1.3 . No BB -  Initially off spiro/entresto with hyperkalemia . K 4.1 today  - Continue  jardiance 10 mg daily.    - Continue hydralazine 75 mg three times a day and  continue Imdur 30 mg daily  -Remains hypertensive. Start entresto 24-26 mg twice a day.   3. Hyperkalemia  Stay off spiro/entresto for now.   4. AKI  Creatinine on admit 1.54, creatinine 1.2 today.    5. H/O PVCs In the past he was on amiodarone due to high PVC burden.   -Continue  amio 200 mg daily.   6. ETOH  Discussed cessation.   Difficult to manage. Poor insight and medication errors prior to admit. HF Paramedicine to see at d/c . HFSW following closely due to issues with un paid bills.    Length of Stay: 8  Amy Clegg, NP  02/15/2021, 12:40 PM  Advanced Heart Failure Team Pager [phone] (M-F; 7a - 5p)  Please contact [facility] for night-coverage after hours (5p -7a ) and weekends on [email or web address]  Patient seen with NP, agree with the above note.   Stable volume status today. BP elevated.  K and creatinine ok.   Will add Entresto 24/26 bid and Coreg 3.125 mg bid.  Will need close outpatient followup, help with meds, and paramedicine at discharge.   Marca Ancona 02/15/2021 1:01 PM

## 2021-02-15 NOTE — Discharge Instructions (Signed)
Scott Greer  You were in the hospital for a foot infection requiring amputation. You are being set up for home health. You will go home with a wound vac. Please follow-up with the orthopedic surgery office and your heart failure clinic.

## 2021-02-15 NOTE — TOC Transition Note (Addendum)
Transition of Care [facility]) - CM/SW Discharge Note   Patient Details  Name: Scott Greer MRN: [id number] Date of Birth: 1956-11-03  Transition of [facility] CM/SW Contact:  Epifanio Lesches, RN Phone Number: 02/15/2021, 2:16 PM   Clinical Narrative:    Patient will DC to: home Anticipated DC date: 02/15/2021 Family notified: yes Transport by: car       - s/p L BKA Per MD patient ready for DC today. RN, patient, and patient's family notified of DC.  Pt states neighbor/friend and family (sister) to assist with his care once d/c.  Pt declined recommendations for SNF placement . Agreeable to home health services. Choice offered. Pt without preference. Referral made to multi home health agencies with no success , however, [name] has now agreed to provide home health services, PT,SW, start of care within  48 hours post d/c. HF Paramedicine to f/u with pt on tomorrow, 4/14. Pt's DME: W/C has been delivered to pt 's home, rolling walker @ bedside with pt. TOC pharmacy to deliver Rx meds to bedside prior to d/c. Rx med ( narcotic) , pt to pick from [facility]), information explained to pt.  Pt with transportation to home, friend Broadus John).  RNCM will sign off for now as intervention is no longer needed. Please consult [place] again if new needs arise.    Final next level of care: Home w Home Health Services Barriers to Discharge: Barriers Resolved   Patient Goals and CMS Choice Patient states their goals for this hospitalization and ongoing recovery are:: "to be able to walk and maintain apartment" CMS Medicare.gov Compare Post Acute Care list provided to:: Other (Comment Required) (Pt wants HH not SNF) Choice offered to / list presented to : NA (Pt does not want SNF prefers [facility])  Discharge Placement                       Discharge Plan and Services In-house Referral: Clinical Social Work Discharge Planning Services: CM Consult            DME  Arranged: Dan Humphreys rolling,Wheelchair manual DME Agency: [employer] Date DME Agency Contacted: 02/14/21 Time DME Agency Contacted: [phone] Representative spoke with at DME Agency: Francesco Sor Arranged: PT,Social Work HH Agency: [facility] Date [facility] Agency Contacted: 02/15/21 Time HH Agency Contacted: 1415 Representative spoke with at [facility] Agency: Kandee Keen  Social Determinants of Health (SDOH) Interventions     Readmission Risk Interventions Readmission Risk Prevention Plan 06/08/2019  Transportation Screening Complete  PCP or Specialist Appt within 5-7 Days Complete  Home Care Screening Complete  Medication Review (RN CM) Complete  Some recent data might be hidden

## 2021-02-16 ENCOUNTER — Other Ambulatory Visit (HOSPITAL_COMMUNITY): Payer: Self-pay | Admitting: *Deleted

## 2021-02-16 ENCOUNTER — Other Ambulatory Visit (HOSPITAL_COMMUNITY): Payer: Self-pay

## 2021-02-16 ENCOUNTER — Telehealth: Payer: Self-pay | Admitting: *Deleted

## 2021-02-16 DIAGNOSIS — E782 Mixed hyperlipidemia: Secondary | ICD-10-CM

## 2021-02-16 MED ORDER — EMPAGLIFLOZIN 10 MG PO TABS: 10.0000 mg | ORAL_TABLET | Freq: Every day | ORAL | 1 refills | Status: DC

## 2021-02-16 MED ORDER — ATORVASTATIN CALCIUM 10 MG PO TABS: 10.0000 mg | ORAL_TABLET | Freq: Every day | ORAL | 1 refills | Status: DC

## 2021-02-16 NOTE — Progress Notes (Signed)
Paramedicine Encounter    Patient ID: Scott Greer, male    DOB: 1955-12-28, 65 y.o.   MRN: [id number]   Patient Care Team: [name], MD as PCP - General (Internal Medicine) [name], [name], MD as PCP - General (Cardiology) [name], [name], MD as PCP - Cardiology (Cardiology)  Patient Active Problem List   Diagnosis Date Noted  . Wound dehiscence   . Gangrene of toe of left foot ([place])   . Cutaneous abscess of left foot   . Left foot infection 02/07/2021  . Leukocytosis 02/07/2021  . Thrombocytosis 02/07/2021  . Hyponatremia 02/07/2021  . AKI (acute kidney injury) ([place]) 02/07/2021  . Hyperglycemia due to diabetes mellitus ([place]) 02/07/2021  . Hypoalbuminemia due to protein-calorie malnutrition ([place]) 02/07/2021  . Osteomyelitis of second toe of left foot ([place])   . New onset of congestive heart failure ([place]) 12/26/2020  . CKD (chronic kidney disease) stage 3, GFR 30-59 ml/min (HCC) 12/26/2020  . Acute systolic CHF (congestive heart failure) ([place]) 12/26/2020  . Anemia, chronic disease 06/22/2020  . Amputee, great toe, right ([place]) 06/20/2020  . Normocytic anemia 06/20/2020  . Erectile dysfunction associated with type 2 diabetes mellitus ([place]) 06/20/2020  . Positive for macroalbuminuria 10/24/2019  . Amputation of left great toe ([place]) 10/23/2019  . Tobacco dependence 10/23/2019  . Osteomyelitis of great toe of right foot ([place]) 06/04/2019  . Diabetic foot infection ([place])   . Noncompliance   . Glaucoma   . Hyperlipidemia   . Essential hypertension 11/06/2003  . Diabetes mellitus ([place]) 11/05/1997    Current Outpatient Medications:  .  amiodarone (PACERONE) 200 MG tablet, Take 1 tablet (200 mg total) by mouth daily., Disp: 30 tablet, Rfl: 1 .  aspirin 81 MG chewable tablet, Chew 1 tablet (81 mg total) by mouth daily., Disp: 30 tablet, Rfl: 2 .  atorvastatin (LIPITOR) 10 MG tablet, Take 1 tablet (10 mg total) by mouth daily., Disp: 30 tablet, Rfl: 1 .  Blood Glucose  Monitoring Suppl (TRUE METRIX METER) w/Device KIT, Use as directed, Disp: 1 kit, Rfl: 0 .  carvedilol (COREG) 3.125 MG tablet, Take 1 tablet (3.125 mg total) by mouth 2 (two) times daily with a meal., Disp: 60 tablet, Rfl: 2 .  digoxin (LANOXIN) 0.125 MG tablet, Take 1 tablet (0.125 mg total) by mouth daily., Disp: 30 tablet, Rfl: 1 .  empagliflozin (JARDIANCE) 10 MG TABS tablet, Take 1 tablet (10 mg total) by mouth daily., Disp: 30 tablet, Rfl: 1 .  gabapentin (NEURONTIN) 300 MG capsule, Take 1 capsule (300 mg total) by mouth 3 (three) times daily., Disp: 90 capsule, Rfl: 0 .  glucose blood (TRUE METRIX BLOOD GLUCOSE TEST) test strip, Use as instructed, Disp: 100 each, Rfl: 12 .  isosorbide-hydrALAZINE (BIDIL) 20-37.5 MG tablet, Take 1 tablet by mouth 3 (three) times daily., Disp: 90 tablet, Rfl: 0 .  metFORMIN (GLUCOPHAGE) 500 MG tablet, Take 1 tablet (500 mg total) by mouth 2 (two) times daily with a meal., Disp: 60 tablet, Rfl: 1 .  oxyCODONE-acetaminophen (PERCOCET/ROXICET) 5-325 MG tablet, Take 1 tablet by mouth every 4 (four) hours as needed for severe pain., Disp: 30 tablet, Rfl: 0 .  sacubitril-valsartan (ENTRESTO) 24-26 MG, Take 1 tablet by mouth 2 (two) times daily., Disp: 60 tablet, Rfl: 2 .  torsemide (DEMADEX) 20 MG tablet, Take 1 tablet (20 mg total) by mouth daily., Disp: 90 tablet, Rfl: 6 .  TRUEplus Lancets 28G MISC, Use as directed, Disp: 100 each, Rfl: 4 .  Vitamin D, Ergocalciferol, (DRISDOL) 1.25 MG (50000 UNIT) CAPS capsule, Take 1 capsule (50,000 Units total) by mouth every 7 (seven) days. (Patient not taking: No sig reported), Disp: 4 capsule, Rfl: 2 Allergies  Allergen Reactions  . Bee Venom Hives, Itching and Swelling     Social History   Socioeconomic History  . Marital status: Legally Separated    Spouse name: Nira Conn  . Number of children: 2  . Years of education: Not on file  . Highest education level: Associate degree: occupational, Hotel manager, or vocational  program  Occupational History  . Not on file  Tobacco Use  . Smoking status: Current Every Day Smoker    Types: Cigars, Cigarettes  . Smokeless tobacco: Former Systems developer    Types: Chew  . Tobacco comment: 6 daily  Vaping Use  . Vaping Use: Never used  Substance and Sexual Activity  . Alcohol use: Yes    Alcohol/week: 11.0 standard drinks    Types: 3 Cans of beer, 8 Shots of liquor per week    Comment: occasional  . Drug use: Yes    Types: Marijuana    Comment: ocassional - last time 05/08/2020  . Sexual activity: Not Currently  Other Topics Concern  . Not on file  Social History Narrative   Out of prison for 2 months.   Lives at home with his wife.   Social Determinants of Health   Financial Resource Strain: High Risk  . Difficulty of Paying Living Expenses: Very hard  Food Insecurity: Food Insecurity Present  . Worried About Charity fundraiser in the Last Year: Sometimes true  . Ran Out of Food in the Last Year: Sometimes true  Transportation Needs: Unmet Transportation Needs  . Lack of Transportation (Medical): Yes  . Lack of Transportation (Non-Medical): Yes  Physical Activity: Inactive  . Days of Exercise per Week: 0 days  . Minutes of Exercise per Session: 0 min  Stress: Not on file  Social Connections: Not on file  Intimate Partner Violence: Not on file    Physical Exam Vitals reviewed.  Constitutional:      Appearance: Normal appearance. He is normal weight.  HENT:     Head: Normocephalic.     Nose: Nose normal.     Mouth/Throat:     Mouth: Mucous membranes are moist.     Pharynx: Oropharynx is clear.  Eyes:     Conjunctiva/sclera: Conjunctivae normal.     Pupils: Pupils are equal, round, and reactive to light.  Cardiovascular:     Rate and Rhythm: Normal rate and regular rhythm.     Pulses: Normal pulses.     Heart sounds: Normal heart sounds.  Pulmonary:     Effort: Pulmonary effort is normal.     Breath sounds: Normal breath sounds.  Abdominal:      General: Abdomen is flat.     Palpations: Abdomen is soft.  Musculoskeletal:        General: Normal range of motion.     Cervical back: Normal range of motion.  Skin:    General: Skin is warm and dry.     Capillary Refill: Capillary refill takes less than 2 seconds.  Neurological:     General: No focal deficit present.     Mental Status: He is alert. Mental status is at baseline.  Psychiatric:        Mood and Affect: Mood normal.     Arrived for home visit for [facility] where he was seated in  his wheel chair alert and oriented reporting to be feeling "fine". Arath was just released from the hospital for a BKA. Joeangel had his wound vac in place and his leg was wrapped and was clean around he dressing and wrapping. Makaio reports he is having some pain but not near as bad as prior to his surgery. I reviewed medications in detail and filled pill box accordingly. During our visit PCS manager called Mr. Walz and was setting up PCS services. He was approved for 14 hours a week for an aid to assist around the home with daily living and cleaning assistance. [place] was grateful for this. I spoke to [place] and I provided her with Jenns LCSW information and number. PCS manager was grateful for this information. I advised Mr. Flanery I would be back out in one week to follow up for home visit and to fill pill box one week.   Refill: Jardiance Atorvastatin     Future Appointments  Date Time Provider [place]  02/17/2021  9:00 AM [facility] CCC-MM CARE MANAGER [facility] None  03/07/2021  1:30 PM [facility] PA/NP [facility] None  03/30/2021  9:30 AM [name], MD [facility] None     ACTION: Home visit completed Next visit planned for one week

## 2021-02-16 NOTE — Telephone Encounter (Signed)
Transition Care Management Follow-up Telephone Call  Date of discharge and from where: 02/15/2021 - [facility]  How have you been since you were released from the hospital? "Alright, I guess"  Any questions or concerns? No  Items Reviewed:  Did the pt receive and understand the discharge instructions provided? Yes   Medications obtained and verified? Yes   Other? No   Any new allergies since your discharge? No   Dietary orders reviewed? No  Do you have support at home? Yes     Functional Questionnaire: (I = Independent and D = Dependent) ADLs: I  Bathing/Dressing- I  Meal Prep- I  Eating- I  Maintaining continence- I  Transferring/Ambulation- I  Managing Meds- I  Follow up appointments reviewed:   PCP Hospital f/u appt confirmed? Yes  Scheduled to see Dr. Laural Benes on 03/30/2021 @ 0930.  Specialist Hospital f/u appt confirmed? Yes  Scheduled to see Heart & Vascular on 03/07/2021 @ 1330.  Are transportation arrangements needed? No   If their condition worsens, is the pt aware to call PCP or go to the Emergency Dept.? Yes  Was the patient provided with contact information for the PCP's office or ED? Yes  Was to pt encouraged to call back with questions or concerns? Yes

## 2021-02-17 ENCOUNTER — Other Ambulatory Visit: Payer: Self-pay | Admitting: *Deleted

## 2021-02-17 ENCOUNTER — Other Ambulatory Visit: Payer: Self-pay

## 2021-02-17 DIAGNOSIS — Z89519 Acquired absence of unspecified leg below knee: Secondary | ICD-10-CM | POA: Insufficient documentation

## 2021-02-17 DIAGNOSIS — Z89512 Acquired absence of left leg below knee: Secondary | ICD-10-CM

## 2021-02-17 NOTE — Patient Instructions (Signed)
Visit Information  Scott Greer was given information about Medicaid Managed Care team care coordination services as a part of their [facility] Medicaid benefit. Scott Greer verbally consented to engagement with the [facility] Managed Care team.   For questions related to your [facility] health plan, please call: [phone]  If you would like to schedule transportation through your [facility] plan, please call the following number at least 2 days in advance of your appointment: [phone]   Call the [facility] Crisis Line at [phone], at any time, 24 hours a day, 7 days a week. If you are in danger or need immediate medical attention call 911.  Scott Greer - following are the goals we discussed in your visit today:  Goals Addressed            This Visit's Progress   . Find Help in My Community       Timeframe:  Long-Range Goal Priority:  High Start Date:   02/17/21                          Expected End Date: 05/19/21                    Follow Up Date 02/22/21   - begin a notebook of services in my neighborhood or community - call 211 when I need some help - follow-up on any referrals for help I am given - make a list of family or friends that I can call    Why is this important?    Knowing how and where to find help for yourself or family in your neighborhood and community is an important skill.   You will want to take some steps to learn how.        . Make and Keep All Appointments       Timeframe:  Long-Range Goal Priority:  High Start Date:    02/17/21                         Expected End Date:   05/19/21                    Follow Up Date 02/22/21   - arrange a ride through an agency 1 week before appointment - ask family or friend for a ride - keep a calendar with appointment dates    Why is this important?    Part of staying healthy is seeing the doctor for follow-up care.   If you forget your appointments, there are some things you can  do to stay on track.           Please see education materials related to diabetic diet provided as print materials.     Diabetes Mellitus and Nutrition, Adult When you have diabetes, or diabetes mellitus, it is very important to have healthy eating habits because your blood sugar (glucose) levels are greatly affected by what you eat and drink. Eating healthy foods in the right amounts, at about the same times every day, can help you:  Control your blood glucose.  Lower your risk of heart disease.  Improve your blood pressure.  Reach or maintain a healthy weight. What can affect my meal plan? Every person with diabetes is different, and each person has different needs for a meal plan. Your health care provider may recommend that you work with a dietitian to make a meal  plan that is best for you. Your meal plan may vary depending on factors such as:  The calories you need.  The medicines you take.  Your weight.  Your blood glucose, blood pressure, and cholesterol levels.  Your activity level.  Other health conditions you have, such as heart or kidney disease. How do carbohydrates affect me? Carbohydrates, also called carbs, affect your blood glucose level more than any other type of food. Eating carbs naturally raises the amount of glucose in your blood. Carb counting is a method for keeping track of how many carbs you eat. Counting carbs is important to keep your blood glucose at a healthy level, especially if you use insulin or take certain oral diabetes medicines. It is important to know how many carbs you can safely have in each meal. This is different for every person. Your dietitian can help you calculate how many carbs you should have at each meal and for each snack. How does alcohol affect me? Alcohol can cause a sudden decrease in blood glucose (hypoglycemia), especially if you use insulin or take certain oral diabetes medicines. Hypoglycemia can be a life-threatening  condition. Symptoms of hypoglycemia, such as sleepiness, dizziness, and confusion, are similar to symptoms of having too much alcohol.  Do not drink alcohol if: ? Your health care provider tells you not to drink. ? You are pregnant, may be pregnant, or are planning to become pregnant.  If you drink alcohol: ? Do not drink on an empty stomach. ? Limit how much you use to:  0-1 drink a day for women.  0-2 drinks a day for men. ? Be aware of how much alcohol is in your drink. In the [place] one drink equals one 12 oz bottle of beer (355 mL), one 5 oz glass of wine (148 mL), or one 1 oz glass of hard liquor (44 mL). ? Keep yourself hydrated with water, diet soda, or unsweetened iced tea.  Keep in mind that regular soda, juice, and other mixers may contain a lot of sugar and must be counted as carbs. What are tips for following this plan? Reading food labels  Start by checking the serving size on the "Nutrition Facts" label of packaged foods and drinks. The amount of calories, carbs, fats, and other nutrients listed on the label is based on one serving of the item. Many items contain more than one serving per package.  Check the total grams (g) of carbs in one serving. You can calculate the number of servings of carbs in one serving by dividing the total carbs by 15. For example, if a food has 30 g of total carbs per serving, it would be equal to 2 servings of carbs.  Check the number of grams (g) of saturated fats and trans fats in one serving. Choose foods that have a low amount or none of these fats.  Check the number of milligrams (mg) of salt (sodium) in one serving. Most people should limit total sodium intake to less than 2,300 mg per day.  Always check the nutrition information of foods labeled as "low-fat" or "nonfat." These foods may be higher in added sugar or refined carbs and should be avoided.  Talk to your dietitian to identify your daily goals for nutrients listed on the  label. Shopping  Avoid buying canned, pre-made, or processed foods. These foods tend to be high in fat, sodium, and added sugar.  Shop around the outside edge of the grocery store. This is where you  will most often find fresh fruits and vegetables, bulk grains, fresh meats, and fresh dairy. Cooking  Use low-heat cooking methods, such as baking, instead of high-heat cooking methods like deep frying.  Cook using healthy oils, such as olive, canola, or sunflower oil.  Avoid cooking with butter, cream, or high-fat meats. Meal planning  Eat meals and snacks regularly, preferably at the same times every day. Avoid going long periods of time without eating.  Eat foods that are high in fiber, such as fresh fruits, vegetables, beans, and whole grains. Talk with your dietitian about how many servings of carbs you can eat at each meal.  Eat 4-6 oz (112-168 g) of lean protein each day, such as lean meat, chicken, fish, eggs, or tofu. One ounce (oz) of lean protein is equal to: ? 1 oz (28 g) of meat, chicken, or fish. ? 1 egg. ?  cup (62 g) of tofu.  Eat some foods each day that contain healthy fats, such as avocado, nuts, seeds, and fish.   What foods should I eat? Fruits Berries. Apples. Oranges. Peaches. Apricots. Plums. Grapes. Mango. Papaya. Pomegranate. Kiwi. Cherries. Vegetables Lettuce. Spinach. Leafy greens, including kale, chard, collard greens, and mustard greens. Beets. Cauliflower. Cabbage. Broccoli. Carrots. Green beans. Tomatoes. Peppers. Onions. Cucumbers. Brussels sprouts. Grains Whole grains, such as whole-wheat or whole-grain bread, crackers, tortillas, cereal, and pasta. Unsweetened oatmeal. Quinoa. Brown or wild rice. Meats and other proteins Seafood. Poultry without skin. Lean cuts of poultry and beef. Tofu. Nuts. Seeds. Dairy Low-fat or fat-free dairy products such as milk, yogurt, and cheese. The items listed above may not be a complete list of foods and beverages you  can eat. Contact a dietitian for more information. What foods should I avoid? Fruits Fruits canned with syrup. Vegetables Canned vegetables. Frozen vegetables with butter or cream sauce. Grains Refined white flour and flour products such as bread, pasta, snack foods, and cereals. Avoid all processed foods. Meats and other proteins Fatty cuts of meat. Poultry with skin. Breaded or fried meats. Processed meat. Avoid saturated fats. Dairy Full-fat yogurt, cheese, or milk. Beverages Sweetened drinks, such as soda or iced tea. The items listed above may not be a complete list of foods and beverages you should avoid. Contact a dietitian for more information. Questions to ask a health care provider  Do I need to meet with a diabetes educator?  Do I need to meet with a dietitian?  What number can I call if I have questions?  When are the best times to check my blood glucose? Where to find more information:  American Diabetes Association: diabetes.org  Academy of Nutrition and Dietetics: www.eatright.[employer] of Diabetes and Digestive and Kidney Diseases: CarFlippers.tn  Association of Diabetes Care and Education Specialists: www.diabeteseducator.org Summary  It is important to have healthy eating habits because your blood sugar (glucose) levels are greatly affected by what you eat and drink.  A healthy meal plan will help you control your blood glucose and maintain a healthy lifestyle.  Your health care provider may recommend that you work with a dietitian to make a meal plan that is best for you.  Keep in mind that carbohydrates (carbs) and alcohol have immediate effects on your blood glucose levels. It is important to count carbs and to use alcohol carefully. This information is not intended to replace advice given to you by your health care provider. Make sure you discuss any questions you have with your health care provider. Document Revised:  09/29/2019  Document Reviewed: 09/29/2019 Elsevier Patient Education  2021 [employer].   The patient verbalized understanding of instructions provided today and agreed to receive a mailed copy of patient instruction and/or educational materials.  Telephone follow up appointment with Managed Medicaid care management team member scheduled for:02/22/21 @ 9am  Estanislado Emms RN, BSN [place]  [facility] RN Care Coordinator   Following is a copy of your plan of care:  Patient Care Plan: General Plan of Care (Adult)    Problem Identified: Health Promotion or Disease Self-Management (General Plan of Care)     Long-Range Goal: Self-Management Plan Developed   Start Date: 02/17/2021  Expected End Date: 05/19/2021  This Visit's Progress: On track  Priority: High  Note:   Current Barriers:   Ineffective Self Health Maintenance-Scott Greer 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 He has a rolling walker and manual wheelchair, he will begin having meal delivery today for 10 days, he was approved for 14 hours/wk for PCS, [name], EMT is managing/preparing his medications, he is waiting on PT to be scheduled. He is concerned about his finanaces. He will no longer be able to work and is unsure how he can manage all of his bills.  Unable to independently manage health care related to recent BKA  Does not attend all scheduled provider appointments  Unable to perform ADLs independently  Unable to perform IADLs independently  Does not contact provider office for questions/concerns  Financial insecurity  Currently UNABLE TO independently self manage needs related to chronic health conditions.   Knowledge Deficits related to short term plan for care coordination needs and long term plans for chronic disease management needs Nurse Case Manager Clinical Goal(s):   patient will work with care management team to address care coordination and chronic disease management needs  related to recent BKA   Interventions:   Evaluation of current treatment plan related to BKA and patient's adherence to plan as established by provider.  Discussed plans with patient for ongoing care management follow up and provided patient with direct contact information for care management team  Provided patient with printed educational materials related to diabetic diet  Social Work referral for financial insecurity  Pharmacy referral for medication management  Discussed the importance of compliance with diabetic diet and keeping BS at normal levels for good healing  Provided therapeutic listening Self Care Activities:  . Patient will self administer medications as prescribed . Patient will attend all scheduled provider appointments . Patient will call provider office for new concerns or questions . Patient will work with BSW to address care coordination needs and will continue to work with the clinical team to address health care and disease management related needs.   . Patient will work with MM Pharmacist, Harrold Donath for medication management Patient Goals: - begin a notebook of services in my neighborhood or community - call 211 when I need some help - follow-up on any referrals for help I am given - make a list of family or friends that I can call  - arrange a ride through an agency 1 week before appointment - ask family or friend for a ride - keep a calendar with appointment dates  Follow Up Plan: Telephone follow up appointment with care management team member scheduled for:02/22/21 @ 9am. RNCM will follow up with [facility] for PT services

## 2021-02-17 NOTE — Patient Outreach (Signed)
Medicaid Managed Care   Nurse Care Manager Note  02/17/2021 Name:  Scott Greer MRN:  [id number] DOB:  [birth year]  Scott Greer is an 65 y.o. year old male who is a primary patient of Scott Pier, MD.  The [facility] Managed Care Coordination team was consulted for assistance with:    recent BKA, DMII, CHF, HTN  Scott Greer was given information about Medicaid Managed Care Coordination team services today. Scott Greer agreed to services and verbal consent obtained.  Engaged with patient by telephone for initial visit in response to provider referral for case management and/or care coordination services.   Assessments/Interventions:  Review of past medical history, allergies, medications, health status, including review of consultants reports, laboratory and other test data, was performed as part of comprehensive evaluation and provision of chronic care management services.  SDOH (Social Determinants of Health) assessments and interventions performed:   Care Plan  Allergies  Allergen Reactions  . Bee Venom Hives, Itching and Swelling    Medications Reviewed Today    Reviewed by Jeris Penta, EMT (Certified Medical Assistant) on 02/16/21 at 1532  Med List Status: <None>  Medication Order Taking? Sig Documenting Provider Last Dose Status Informant  amiodarone (PACERONE) 200 MG tablet [id number] Yes Take 1 tablet (200 mg total) by mouth daily. [place], Maricela Bo, FNP Taking Active Care Giver           Med Note Scott Greer, Scott D   Tue Feb 07, 2021  1:53 AM) LF 01/05/21 #30  aspirin 81 MG chewable tablet [id number] Yes Chew 1 tablet (81 mg total) by mouth daily. [place], Maricela Bo, FNP Taking Active Care Giver           Med Note Scott Greer, Scott D   Tue Feb 07, 2021  1:53 AM) LF 01/05/21 #30  atorvastatin (LIPITOR) 10 MG tablet [id number] Yes Take 1 tablet (10 mg total) by mouth daily. [place], Maricela Bo, FNP Taking Active Care Giver           Med Note Scott Greer, Scott D   Tue Feb 07, 2021   1:54 AM) LF 12/14/20 #30  Blood Glucose Monitoring Suppl (TRUE METRIX METER) w/Device KIT [id number] Yes Use as directed Scott Pier, MD Taking Active Care Giver  carvedilol (COREG) 3.125 MG tablet [id number] Yes Take 1 tablet (3.125 mg total) by mouth 2 (two) times daily with a meal. Mariel Aloe, MD Taking Active   digoxin (LANOXIN) 0.125 MG tablet [id number] Yes Take 1 tablet (0.125 mg total) by mouth daily. [place], Maricela Bo, FNP Taking Active Care Giver           Med Note Scott Greer, Scott D   Tue Feb 07, 2021  1:54 AM) LF 01/05/21 #30  empagliflozin (JARDIANCE) 10 MG TABS tablet [id number] Yes Take 1 tablet (10 mg total) by mouth daily. Scott Bihari, FNP Taking Active Care Giver           Med Note Scott Greer, Scott D   Tue Feb 07, 2021  1:55 AM) LF 01/05/21 #30  gabapentin (NEURONTIN) 300 MG capsule [id number] Yes Take 1 capsule (300 mg total) by mouth 3 (three) times daily. Mariel Aloe, MD Taking Active   glucose blood (TRUE METRIX BLOOD GLUCOSE TEST) test strip [id number] Yes Use as instructed Scott Pier, MD Taking Active Care Giver  isosorbide-hydrALAZINE (BIDIL) 20-37.5 MG tablet [id number] Yes Take 1 tablet by mouth 3 (three) times daily. Mariel Aloe, MD Taking Active   metFORMIN (  GLUCOPHAGE) 500 MG tablet [id number] Yes Take 1 tablet (500 mg total) by mouth 2 (two) times daily with a meal. [name], Scott Grip, PA-C Taking Active Care Giver           Med Note Scott Greer, Scott D   Tue Feb 07, 2021  1:57 AM) LF 12/14/20 #60  oxyCODONE-acetaminophen (PERCOCET/ROXICET) 5-325 MG tablet [id number] Yes Take 1 tablet by mouth every 4 (four) hours as needed for severe pain. Mariel Aloe, MD Taking Active   sacubitril-valsartan [facility]) 24-26 [place] [id number] Yes Take 1 tablet by mouth 2 (two) times daily. Mariel Aloe, MD Taking Active   torsemide (DEMADEX) 20 MG tablet [id number] Yes Take 1 tablet (20 mg total) by mouth daily. Mariel Aloe, MD Taking Active   TRUEplus Lancets 28G [place] [id number]  Yes Use as directed Scott Pier, MD Taking Active Care Giver  Vitamin D, Ergocalciferol, (DRISDOL) 1.25 MG (50000 UNIT) CAPS capsule [id number] No Take 1 capsule (50,000 Units total) by mouth every 7 (seven) days.  Patient not taking: No sig reported   [name], Scott Grip, PA-C Not Taking Active Care Giver           Med Note Scott Greer, Scott D   Tue Feb 07, 2021  1:57 AM) Never filled          Patient Active Problem List   Diagnosis Date Noted  . Status post below-knee amputation ([place]) 02/17/2021  . Wound dehiscence   . Gangrene of toe of left foot ([place])   . Cutaneous abscess of left foot   . Left foot infection 02/07/2021  . Leukocytosis 02/07/2021  . Thrombocytosis 02/07/2021  . Hyponatremia 02/07/2021  . AKI (acute kidney injury) ([place]) 02/07/2021  . Hyperglycemia due to diabetes mellitus ([place]) 02/07/2021  . Hypoalbuminemia due to protein-calorie malnutrition ([place]) 02/07/2021  . Osteomyelitis of second toe of left foot ([place])   . New onset of congestive heart failure ([place]) 12/26/2020  . CKD (chronic kidney disease) stage 3, GFR 30-59 ml/min (HCC) 12/26/2020  . Acute systolic CHF (congestive heart failure) ([place]) 12/26/2020  . Anemia, chronic disease 06/22/2020  . Amputee, great toe, right ([place]) 06/20/2020  . Normocytic anemia 06/20/2020  . Erectile dysfunction associated with type 2 diabetes mellitus ([place]) 06/20/2020  . Positive for macroalbuminuria 10/24/2019  . Amputation of left great toe ([place]) 10/23/2019  . Tobacco dependence 10/23/2019  . Osteomyelitis of great toe of right foot ([place]) 06/04/2019  . Diabetic foot infection ([place])   . Noncompliance   . Glaucoma   . Hyperlipidemia   . Essential hypertension 11/06/2003  . Diabetes mellitus ([place]) 11/05/1997    Conditions to be addressed/monitored per PCP order:  CHF, HTN, DMII and recent [place] : General Plan of Care (Adult)  Updates made by [name], RN since 02/17/2021 12:00 AM    Problem: Health Promotion  or Disease Self-Management (General Plan of Care)     Long-Range Goal: Self-Management Plan Developed   Start Date: 02/17/2021  Expected End Date: 05/19/2021  This Visit's Progress: On track  Priority: High  Note:   Current Barriers:   Ineffective Self Health Maintenance-Mr. Duell 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 He has a rolling walker and manual wheelchair, he will begin having meal delivery today for 10 days, he was approved for 14 hours/wk for PCS, [name], EMT is managing/preparing his medications, he is waiting on PT to be scheduled. He is concerned about his finanaces. He will  no longer be able to work and is unsure how he can manage all of his bills.  Unable to independently manage health care related to recent BKA  Does not attend all scheduled provider appointments  Unable to perform ADLs independently  Unable to perform IADLs independently  Does not contact provider office for questions/concerns  Financial insecurity  Currently UNABLE TO independently self manage needs related to chronic health conditions.   Knowledge Deficits related to short term plan for care coordination needs and long term plans for chronic disease management needs Nurse Case Manager Clinical Goal(s):   patient will work with care management team to address care coordination and chronic disease management needs related to recent BKA   Interventions:   Evaluation of current treatment plan related to BKA and patient's adherence to plan as established by provider.  Discussed plans with patient for ongoing care management follow up and provided patient with direct contact information for care management team  Provided patient with printed educational materials related to diabetic diet  Social Work referral for financial insecurity  Pharmacy referral for medication management  Discussed the importance of compliance with diabetic diet and keeping BS at normal levels for good  healing  Provided therapeutic listening Self Care Activities:  . Patient will self administer medications as prescribed . Patient will attend all scheduled provider appointments . Patient will call provider office for new concerns or questions . Patient will work with BSW to address care coordination needs and will continue to work with the clinical team to address health care and disease management related needs.   . Patient will work with MM Pharmacist, [name] for medication management Patient Goals: - begin a notebook of services in my neighborhood or community - call 211 when I need some help - follow-up on any referrals for help I am given - make a list of family or friends that I can call  - arrange a ride through an agency 1 week before appointment - ask family or friend for a ride - keep a calendar with appointment dates  Follow Up Plan: Telephone follow up appointment with care management team member scheduled for:02/22/21 @ [place] will follow up with [facility] for PT services      Follow Up:  Patient agrees to Care Plan and Follow-up.  Plan: The Managed Medicaid care management team will reach out to the patient again over the next 7 days.  Date/time of next scheduled RN care management/care coordination outreach: 02/22/21 @ [place] RN, BSN   [employer] manager

## 2021-02-20 ENCOUNTER — Telehealth: Payer: Self-pay | Admitting: Internal Medicine

## 2021-02-20 NOTE — Telephone Encounter (Signed)
I left Scott Greer a message to please call me back so I can get him scheduled with the MM pharmacist. I left my name and number on his VM.

## 2021-02-21 ENCOUNTER — Ambulatory Visit (INDEPENDENT_AMBULATORY_CARE_PROVIDER_SITE_OTHER): Payer: Medicaid Other | Admitting: Orthopedic Surgery

## 2021-02-21 ENCOUNTER — Telehealth: Payer: Self-pay | Admitting: Orthopedic Surgery

## 2021-02-21 ENCOUNTER — Ambulatory Visit: Payer: Medicaid Other | Admitting: Orthopedic Surgery

## 2021-02-21 DIAGNOSIS — T8781 Dehiscence of amputation stump: Secondary | ICD-10-CM

## 2021-02-21 DIAGNOSIS — R69 Illness, unspecified: Secondary | ICD-10-CM | POA: Diagnosis not present

## 2021-02-21 DIAGNOSIS — Z89512 Acquired absence of left leg below knee: Secondary | ICD-10-CM

## 2021-02-21 MED ORDER — OXYCODONE-ACETAMINOPHEN 10-325 MG PO TABS: 1.0000 | ORAL_TABLET | ORAL | 0 refills | Status: DC | PRN

## 2021-02-21 NOTE — Telephone Encounter (Signed)
I called pt and he states that he fell x 2 since Saturday and that he had direct inpact on the limb ( recent BKA) and that he is in pain. Made an appt for eval today at 3:15

## 2021-02-21 NOTE — Telephone Encounter (Signed)
Pt called stating he had a surgery recently and when he was discharged he ended up falling. Pt states he's been in a lot of pain since then and has made an appt for 02/23/21 nut he still wanted to make Denny Peon and Chales Abrahams aware of what's going on. He would like a CB as soon as possible from either of them.   [phone]

## 2021-02-22 ENCOUNTER — Other Ambulatory Visit: Payer: Self-pay | Admitting: *Deleted

## 2021-02-22 DIAGNOSIS — R69 Illness, unspecified: Secondary | ICD-10-CM | POA: Diagnosis not present

## 2021-02-22 NOTE — Patient Outreach (Signed)
Care Coordination  02/22/2021  Scott Greer February 24, 1956 [id number]   Medicaid Managed Care   Unsuccessful Outreach Note  02/22/2021 Name: Scott Greer MRN: [id number] DOB: September 21, 1956  Referred by: [name], MD Reason for referral : High Risk Managed Medicaid (Unsuccessful RNCM follow up outreach)   An unsuccessful telephone outreach was attempted today. The patient was referred to the case management team for assistance with care management and care coordination.   Follow Up Plan: A HIPAA compliant phone message was left for the patient providing contact information and requesting a return call.  The care management team will reach out to the patient again over the next 7-14 days.   Estanislado Emms RN, BSN [place]  [employer]

## 2021-02-22 NOTE — Patient Instructions (Signed)
Visit Information  [name]  - as a part of your Medicaid benefit, you are eligible for care management and care coordination services at no cost or copay. I was unable to reach you by phone today but would be happy to help you with your health related needs. Please feel free to call me @ [phone].   A member of the Managed Medicaid care management team will reach out to you again over the next 7-14 days.   Estanislado Emms RN, BSN [place]  [employer]

## 2021-02-23 ENCOUNTER — Ambulatory Visit: Payer: Medicaid Other | Admitting: Orthopedic Surgery

## 2021-02-23 ENCOUNTER — Encounter: Payer: Self-pay | Admitting: Orthopedic Surgery

## 2021-02-23 ENCOUNTER — Telehealth (HOSPITAL_COMMUNITY): Payer: Self-pay | Admitting: Licensed Clinical Social Worker

## 2021-02-23 ENCOUNTER — Other Ambulatory Visit: Payer: Self-pay

## 2021-02-23 DIAGNOSIS — R69 Illness, unspecified: Secondary | ICD-10-CM | POA: Diagnosis not present

## 2021-02-23 DIAGNOSIS — E1152 Type 2 diabetes mellitus with diabetic peripheral angiopathy with gangrene: Secondary | ICD-10-CM | POA: Diagnosis not present

## 2021-02-23 NOTE — Patient Outreach (Signed)
Medicaid Managed Care    Pharmacy Note  02/23/2021 Name: Scott Greer MRN: [id number] DOB: 08-01-1956  Scott Greer is a 65 y.o. year old male who is a primary care patient of Scott Pier, MD. The [facility] Managed Care Coordination team was consulted for assistance with disease management and care coordination needs.    Engaged with patient Engaged with patient by telephone for initial visit in response to referral for case management and/or care coordination services.  Scott Greer was given information about Managed Medicaid Care Coordination team services today. Scott Greer agreed to services and verbal consent obtained.   Objective:  Lab Results  Component Value Date   CREATININE 1.24 02/15/2021   CREATININE 1.05 02/14/2021   CREATININE 1.20 02/13/2021    Lab Results  Component Value Date   HGBA1C 9.1 (H) 02/08/2021       Component Value Date/Time   CHOL 125 12/14/2020 1156   TRIG 90 12/14/2020 1156   HDL 61 12/14/2020 1156   CHOLHDL 2.0 12/14/2020 1156   LDLCALC 47 12/14/2020 1156    Other: (TSH, CBC, Vit D, etc.)  Clinical ASCVD: Yes  The ASCVD Risk score Scott Bussing DC Jr., et al., 2013) failed to calculate for the following reasons:   The patient has a prior MI or stroke diagnosis    Other: (CHADS2VASc if Afib, PHQ9 if depression, MMRC or CAT for COPD, ACT, DEXA)  BP Readings from Last 3 Encounters:  02/15/21 (!) 158/82  01/30/21 126/70  01/25/21 93/66    Assessment/Interventions: Review of patient past medical history, allergies, medications, health status, including review of consultants reports, laboratory and other test data, was performed as part of comprehensive evaluation and provision of chronic care management services.   DM Metformin 500mg  BID Empagliflozin 10mg  Plan: Sugars are not under control but I don't know if patient is compliant or not. First priority is to get meds in packaging to determine compliance, then will assess if he  needs more therapy  Lipids: Atorvastatin 10mg  Plan: Candidate for high intensity statin. Patient doesn't have a PCP. Will wait until he has appt then request increase in dose  Cardio Amiodarone Carvedilol ASA Isosorbide/Hydralazine Entresto Spironolactone Digoxin Plan: I don't know if patient is compliant or not. First priority is to get meds in packaging to determine compliance, then will assess if he needs more/less therapy  Edema Torsemide KCL Plan: At goal,  patient stable/ symptoms controlled  Pain -leg amputation Oxycodone/APAP 10/325 Plan: Didn't go over pain tolerance today, patient didn't wish to talk about his leg. F/U next visit  Meds: -Patient has leg amputation so is unable to get refills of meds. Also, he has a paramedic come to the house every Thursday to organize his medications Plan: Verbal consent obtained for [facility] enhanced pharmacy services (medication synchronization, adherence packaging, delivery coordination). A medication sync plan was created to allow patient to get all medications delivered once every 30 to 90 days per patient preference. Patient understands they have freedom to choose pharmacy and clinical pharmacist will coordinate care between all prescribers and [facility].    SDOH (Social Determinants of Health) assessments and interventions performed:    Care Plan  Allergies  Allergen Reactions  . Bee Venom Hives, Itching and Swelling    Medications Reviewed Today    Reviewed by Scott Minion, MD (Physician) on 02/23/21 at [place] List Status: <None>  Medication Order Taking? Sig Documenting Provider Last Dose Status Informant  amiodarone (PACERONE)  200 MG tablet [id number] No Take 1 tablet (200 mg total) by mouth daily. Scott Greer, Scott Bo, FNP Taking Active Care Giver           Med Note Scott Greer, [name]   Tue Feb 07, 2021  1:53 AM) LF 01/05/21 #30  amiodarone (PACERONE) 200 MG tablet [id number]  TAKE 1 TABLET (200 MG  TOTAL) BY MOUTH DAILY. Scott Ford, Scott Greer  Active   aspirin 81 MG chewable tablet [id number] No Chew 1 tablet (81 mg total) by mouth daily. Scott Greer, Scott Bo, FNP Taking Active Care Giver           Med Note Scott Greer, [name]   Tue Feb 07, 2021  1:53 AM) LF 01/05/21 #30  aspirin 81 MG chewable tablet [id number]  CHEW 1 TABLET (81 MG TOTAL) BY MOUTH DAILY. Scott Greer, Scott Addison, Scott Greer  Active   atorvastatin (LIPITOR) 10 MG tablet [id number]  Take 1 tablet (10 mg total) by mouth daily. [place], Scott Greer, Scott Greer  Active   Blood Glucose Monitoring Suppl (TRUE METRIX METER) w/Device Scott Greer [id number] No Use as directed Scott Pier, MD Taking Active Care Giver  carvedilol (COREG) 3.125 MG tablet [id number] No Take 1 tablet (3.125 mg total) by mouth 2 (two) times daily with a meal. Scott Aloe, MD Taking Active   digoxin (LANOXIN) 0.125 MG tablet [id number] No Take 1 tablet (0.125 mg total) by mouth daily. Scott Greer, Scott Bo, FNP Taking Active Care Giver           Med Note Scott Greer, [name]   Tue Feb 07, 2021  1:54 AM) LF 01/05/21 #30  digoxin (LANOXIN) 0.125 MG tablet [id number]  TAKE 1 TABLET (0.125 MG TOTAL) BY MOUTH DAILY. Scott Greer, Scott Addison, Scott Greer  Active   empagliflozin (JARDIANCE) 10 MG TABS tablet [id number]  TAKE 1 TABLET (10 MG TOTAL) BY MOUTH DAILY. Scott Greer, Scott Addison, Scott Greer  Active   empagliflozin (JARDIANCE) 10 MG TABS tablet [id number]  Take 1 tablet (10 mg total) by mouth daily. Scott Greer, Scott Greer,   Active   gabapentin (NEURONTIN) 300 MG capsule [id number] No Take 1 capsule (300 mg total) by mouth 3 (three) times daily. Scott Aloe, MD Taking Active   glucose blood (TRUE METRIX BLOOD GLUCOSE TEST) test strip [id number] No Use as instructed Scott Pier, MD Taking Active Care Giver  isosorbide-hydrALAZINE (BIDIL) 20-37.5 MG tablet [id number] No Take 1 tablet by mouth 3 (three) times daily. Scott Aloe, MD Taking Active   metFORMIN (GLUCOPHAGE) 500 MG tablet [id number] No Take 1 tablet (500 mg total) by mouth  2 (two) times daily with a meal. Scott Greer, Scott Grip, PA-C Taking Active Care Giver           Med Note Scott Greer, [name]   Tue Feb 07, 2021  1:57 AM) LF 12/14/20 #60  oxyCODONE-acetaminophen (PERCOCET) 10-325 MG tablet [id number] Yes Take 1 tablet by mouth every 4 (four) hours as needed for pain. Scott Minion, MD  Active   oxyCODONE-acetaminophen (PERCOCET/ROXICET) 5-325 MG tablet [id number] No Take 1 tablet by mouth every 4 (four) hours as needed for severe pain. Scott Aloe, MD Taking Active   potassium chloride SA (KLOR-CON) 20 MEQ tablet [id number]  TAKE 1 TABLET (20 MEQ) BY MOUTH DAILY. Scott Greer, Scott Addison, Scott Greer  Active   sacubitril-valsartan (ENTRESTO) 24-26 [place] [id number] No Take 1 tablet by mouth 2 (two) times daily. Scott Aloe, MD Taking Active   sacubitril-valsartan (ENTRESTO) 49-51 MG [id number]  TAKE 1 TABLET BY MOUTH TWO TIMES  DAILY. Scott Ford, Scott Greer  Active   spironolactone (ALDACTONE) 25 MG tablet [id number]  TAKE 1 TABLET (25 MG TOTAL) BY MOUTH DAILY. Scott Ford, Scott Greer  Active   torsemide (DEMADEX) 20 MG tablet [id number]  TAKE 2 TABLETS (40 MG) BY MOUTH DAILY. Scott Ford, Scott Greer  Active   torsemide (DEMADEX) 20 MG tablet [id number] No Take 1 tablet (20 mg total) by mouth daily. Scott Aloe, MD Taking Active   TRUEplus Lancets 28G MISC [id number] No Use as directed Scott Pier, MD Taking Active Care Giver  Vitamin D, Ergocalciferol, (DRISDOL) 1.25 MG (50000 UNIT) CAPS capsule [id number] No Take 1 capsule (50,000 Units total) by mouth every 7 (seven) days.  Patient not taking: No sig reported   Scott Greer, Scott Grip, PA-C Not Taking Active Care Giver           Med Note Scott Greer, [name]   Tue Feb 07, 2021  1:57 AM) Never filled          Patient Active Problem List   Diagnosis Date Noted  . Status post below-knee amputation ([place]) 02/17/2021  . Wound dehiscence   . Gangrene of toe of left foot ([place])   . Cutaneous abscess of left foot   . Left foot infection 02/07/2021  .  Leukocytosis 02/07/2021  . Thrombocytosis 02/07/2021  . Hyponatremia 02/07/2021  . AKI (acute kidney injury) ([place]) 02/07/2021  . Hyperglycemia due to diabetes mellitus ([place]) 02/07/2021  . Hypoalbuminemia due to protein-calorie malnutrition ([place]) 02/07/2021  . Osteomyelitis of second toe of left foot ([place])   . New onset of congestive heart failure ([place]) 12/26/2020  . CKD (chronic kidney disease) stage 3, GFR 30-59 ml/min (HCC) 12/26/2020  . Acute systolic CHF (congestive heart failure) ([place]) 12/26/2020  . Anemia, chronic disease 06/22/2020  . Amputee, great toe, right ([place]) 06/20/2020  . Normocytic anemia 06/20/2020  . Erectile dysfunction associated with type 2 diabetes mellitus ([place]) 06/20/2020  . Positive for macroalbuminuria 10/24/2019  . Amputation of left great toe ([place]) 10/23/2019  . Tobacco dependence 10/23/2019  . Osteomyelitis of great toe of right foot ([place]) 06/04/2019  . Diabetic foot infection ([place])   . Noncompliance   . Glaucoma   . Hyperlipidemia   . Essential hypertension 11/06/2003  . Diabetes mellitus ([place]) 11/05/1997    Conditions to be addressed/monitored: HTN, Hypertriglyceridemia and DM  Care Plan : Medication Management  Updates made by Lane Hacker, [place] since 02/23/2021 12:00 AM    Problem: Health Promotion or Disease Self-Management (General Plan of Care)     Goal: Medication Management   Note:   Current Barriers:  . Unable to independently monitor therapeutic efficacy . Unable to self administer medications as prescribed . Does not adhere to prescribed medication regimen . Does not maintain contact with provider office . Does not contact provider office for questions/concerns .   Pharmacist Clinical Goal(s):  Marland Kitchen Over the next 30 days, patient will contact provider office for questions/concerns as evidenced notation of same in electronic health record through collaboration with PharmD and provider.  .   Interventions: . Inter-disciplinary  care team collaboration (see longitudinal plan of care) . Comprehensive medication review performed; medication list updated in electronic medical record  @RXCPDIABETES @ @RXCPHYPERTENSION @ @RXCPHYPERLIPIDEMIA @  Patient Goals/Self-Care Activities . Over the next 30 days, patient will:  - take medications as prescribed  Follow Up Plan: The care management team will reach out to the patient again over the next 30 days.     Task: Mutually  Develop and Royce Macadamia Achievement of Patient Goals   Note:   Care Management Activities:    - verbalization of feelings encouraged    Notes:      Medication Assistance: Utilizing [facility] Pharmacy [facility] (Wants delivery and packaging)  Follow up: Agree   Plan: The care management team will reach out to the patient again over the next 30 days.   [place] Constable, Pharm.D., Managed Medicaid Pharmacist - [phone]

## 2021-02-23 NOTE — Telephone Encounter (Signed)
Community paramedic brought CSW past bills for gas and electric so we could move forward with assistance applications but after review bills are too old and dont show that pt has any past due amounts so would not be useful in applying for assistance.  CSW called pt to discuss- left message  Will continue to follow and assist as needed  Burna Sis, LCSW Clinical Social Worker [facility] Desk#: [phone] Cell#: [phone]

## 2021-02-23 NOTE — Patient Instructions (Signed)
Visit Information  Scott Greer was given information about Medicaid Managed Care team care coordination services as a part of their [facility] Medicaid benefit. Scott Greer verbally consented to engagement with the [facility] Managed Care team.   For questions related to your [facility] health plan, please call: [phone]  If you would like to schedule transportation through your [facility] plan, please call the following number at least 2 days in advance of your appointment: [phone]   Call the [facility] Crisis Line at [phone], at any time, 24 hours a day, 7 days a week. If you are in danger or need immediate medical attention call 911.  [name] - following are the goals we discussed in your visit today:  Goals Addressed            This Visit's Progress   . Manage My Medicine       Timeframe:  Short-Term Goal Priority:  High Start Date:                             Expected End Date:                       Follow Up Date Monthly   - call for medicine refill 2 or 3 days before it runs out - call if I am sick and can't take my medicine - keep a list of all the medicines I take; vitamins and herbals too - learn to read medicine labels - use a pillbox to sort medicine - use an alarm clock or phone to remind me to take my medicine    Why is this important?   . These steps will help you keep on track with your medicines.   Notes:        Please see education materials related to DM provided as print materials.   Patient verbalizes understanding of instructions provided today.   The Managed Medicaid care management team will reach out to the patient again over the next 30 days.   Scott Greer, Pharm.D., Managed Medicaid Pharmacist [phone]   Following is a copy of your plan of care:  Patient Care Plan: General Plan of Care (Adult)    Problem Identified: Health Promotion or Disease Self-Management (General Plan of Care)      Long-Range Goal: Self-Management Plan Developed   Start Date: 02/17/2021  Expected End Date: 05/19/2021  This Visit's Progress: On track  Priority: High  Note:   Current Barriers:   Ineffective Self Health Maintenance-[name] 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 He has a rolling walker and manual wheelchair, he will begin having meal delivery today for 10 days, he was approved for 14 hours/wk for PCS, [name], EMT is managing/preparing his medications, he is waiting on PT to be scheduled. He is concerned about his finanaces. He will no longer be able to work and is unsure how he can manage all of his bills.  Unable to independently manage health care related to recent BKA  Does not attend all scheduled provider appointments  Unable to perform ADLs independently  Unable to perform IADLs independently  Does not contact provider office for questions/concerns  Financial insecurity  Currently UNABLE TO independently self manage needs related to chronic health conditions.   Knowledge Deficits related to short term plan for care coordination needs and long term plans for chronic disease management needs Nurse Case Manager  Clinical Goal(s):   patient will work with care management team to address care coordination and chronic disease management needs related to recent BKA   Interventions:   Evaluation of current treatment plan related to BKA and patient's adherence to plan as established by provider.  Discussed plans with patient for ongoing care management follow up and provided patient with direct contact information for care management team  Provided patient with printed educational materials related to diabetic diet  Social Work referral for financial insecurity  Pharmacy referral for medication management  Discussed the importance of compliance with diabetic diet and keeping BS at normal levels for good healing  Provided therapeutic listening Self Care  Activities:  . Patient will self administer medications as prescribed . Patient will attend all scheduled provider appointments . Patient will call provider office for new concerns or questions . Patient will work with BSW to address care coordination needs and will continue to work with the clinical team to address health care and disease management related needs.   . Patient will work with MM Pharmacist, Scott Greer for medication management Patient Goals: - begin a notebook of services in my neighborhood or community - call 211 when I need some help - follow-up on any referrals for help I am given - make a list of family or friends that I can call  - arrange a ride through an agency 1 week before appointment - ask family or friend for a ride - keep a calendar with appointment dates  Follow Up Plan: Telephone follow up appointment with care management team member scheduled for:02/22/21 @ 9am. RNCM will follow up with [name] for PT services    Patient Care Plan: Medication Management    Problem Identified: Health Promotion or Disease Self-Management (General Plan of Care)     Goal: Medication Management   Note:   Current Barriers:  . Unable to independently monitor therapeutic efficacy . Unable to self administer medications as prescribed . Does not adhere to prescribed medication regimen . Does not maintain contact with provider office . Does not contact provider office for questions/concerns .   Pharmacist Clinical Goal(s):  Marland Kitchen Over the next 30 days, patient will contact provider office for questions/concerns as evidenced notation of same in electronic health record through collaboration with PharmD and provider.  .   Interventions: . Inter-disciplinary care team collaboration (see longitudinal plan of care) . Comprehensive medication review performed; medication list updated in electronic medical record  @RXCPDIABETES @ @RXCPHYPERTENSION @ @RXCPHYPERLIPIDEMIA @  Patient  Goals/Self-Care Activities . Over the next 30 days, patient will:  - take medications as prescribed  Follow Up Plan: The care management team will reach out to the patient again over the next 30 days.     Task: Mutually Develop and Achievement of Patient Goals   Note:   Care Management Activities:    - verbalization of feelings encouraged    Notes:

## 2021-02-23 NOTE — Progress Notes (Signed)
Office Visit Note   Patient: Scott Greer           Date of Birth: 06/13/1956           MRN: [id number] Visit Date: 02/21/2021              Requested by: [name], MD [address] [place],  [place] [address] PCP: [name], MD  No chief complaint on file.     HPI: Patient is a 65 year old gentleman who is 2 weeks status post left below-knee amputation.  Patient states at home he has had multiple falls on the residual limb he has not been wearing his limb protector.  Assessment & Plan: Visit Diagnoses:  1. Dehiscence of amputation stump (HCC)   2. S/P BKA (below knee amputation) unilateral, left (HCC)     Plan: Discussed the importance of wearing the limb protector at all times he is currently on Neurontin a prescription for Percocet 10 mg tablets is called in.  Patient is scheduled to have assistance with a home health aide.  Follow-Up Instructions: Return in about 2 weeks (around 03/07/2021).   Ortho Exam  Patient is alert, oriented, no adenopathy, well-dressed, normal affect, normal respiratory effort. Examination patient has 2 areas of abrasion and skin tears over the residual limb there is no dehiscence of the surgical incision.  Patient states he has had 2 recent falls.  4 x 4's and Ace wrap was used to help with the swelling and the skin abrasion.  Imaging: No results found. No images are attached to the encounter.  Labs: Lab Results  Component Value Date   HGBA1C 9.1 (H) 02/08/2021   HGBA1C 8.6 (H) 02/07/2021   HGBA1C 8.4 (A) 12/14/2020   ESRSEDRATE 78 (H) 06/04/2019   CRP 4.5 (H) 06/04/2019   REPTSTATUS 02/11/2021 FINAL 02/06/2021   REPTSTATUS 02/11/2021 FINAL 02/06/2021   CULT  02/06/2021    NO GROWTH 5 DAYS Performed at [facility], [address] [place], [place] [address]    CULT  02/06/2021    NO GROWTH 5 DAYS Performed at [facility], [address] [place], [place] [address]      Lab Results  Component  Value Date   ALBUMIN 2.3 (L) 02/07/2021   ALBUMIN 2.5 (L) 02/06/2021   ALBUMIN 3.1 (L) 12/25/2020   PREALBUMIN 13.8 (L) 06/04/2019    Lab Results  Component Value Date   MG 1.8 02/07/2021   MG 2.0 01/05/2021   MG 2.1 01/04/2021   Lab Results  Component Value Date   VD25OH 9.2 (L) 12/14/2020    Lab Results  Component Value Date   PREALBUMIN 13.8 (L) 06/04/2019   CBC EXTENDED Latest Ref Rng & Units 02/15/2021 02/14/2021 02/13/2021  WBC 4.0 - 10.5 K/uL 10.7(H) 10.3 13.4(H)  RBC 4.22 - 5.81 MIL/uL 3.21(L) 3.30(L) 3.27(L)  HGB 13.0 - 17.0 g/dL 5.4(Y) 8.9(L) 8.8(L)  HCT 39.0 - 52.0 % 26.9(L) 27.6(L) 27.3(L)  PLT 150 - 400 K/uL 488(H) 485(H) 480(H)  NEUTROABS 1.7 - 7.7 K/uL - - -  LYMPHSABS 0.7 - 4.0 K/uL - - -     There is no height or weight on file to calculate BMI.  Orders:  No orders of the defined types were placed in this encounter.  Meds ordered this encounter  Medications  . oxyCODONE-acetaminophen (PERCOCET) 10-325 MG tablet    Sig: Take 1 tablet by mouth every 4 (four) hours as needed for pain.  Dispense:  30 tablet    Refill:  0     Procedures: No procedures performed  Clinical Data: No additional findings.  ROS:  All other systems negative, except as noted in the HPI. Review of Systems  Objective: Vital Signs: There were no vitals taken for this visit.  Specialty Comments:  No specialty comments available.  PMFS History: Patient Active Problem List   Diagnosis Date Noted  . Status post below-knee amputation (HCC) 02/17/2021  . Wound dehiscence   . Gangrene of toe of left foot (HCC)   . Cutaneous abscess of left foot   . Left foot infection 02/07/2021  . Leukocytosis 02/07/2021  . Thrombocytosis 02/07/2021  . Hyponatremia 02/07/2021  . AKI (acute kidney injury) (HCC) 02/07/2021  . Hyperglycemia due to diabetes mellitus (HCC) 02/07/2021  . Hypoalbuminemia due to protein-calorie malnutrition (HCC) 02/07/2021  . Osteomyelitis of second toe  of left foot (HCC)   . New onset of congestive heart failure (HCC) 12/26/2020  . CKD (chronic kidney disease) stage 3, GFR 30-59 ml/min (HCC) 12/26/2020  . Acute systolic CHF (congestive heart failure) (HCC) 12/26/2020  . Anemia, chronic disease 06/22/2020  . Amputee, great toe, right (HCC) 06/20/2020  . Normocytic anemia 06/20/2020  . Erectile dysfunction associated with type 2 diabetes mellitus (HCC) 06/20/2020  . Positive for macroalbuminuria 10/24/2019  . Amputation of left great toe (HCC) 10/23/2019  . Tobacco dependence 10/23/2019  . Osteomyelitis of great toe of right foot (HCC) 06/04/2019  . Diabetic foot infection (HCC)   . Noncompliance   . Glaucoma   . Hyperlipidemia   . Essential hypertension 11/06/2003  . Diabetes mellitus (HCC) 11/05/1997   Past Medical History:  Diagnosis Date  . Dehiscence of amputation stump (HCC)    left great toe  . Diabetes mellitus without complication (HCC) 1999   Type II  . Glaucoma 2015  . Hyperlipidemia   . Hypertension 2005  . Left foot infection 02/07/2021  . Osteomyelitis (HCC)   . Wears glasses     Family History  Problem Relation Age of Onset  . Stroke Mother   . Diabetes Mother        Toward end of life    Past Surgical History:  Procedure Laterality Date  . AMPUTATION Left 06/05/2019   Procedure: LEFT GREAT TOE AMPUTATION;  Surgeon: [name], MD;  Location: [facility] OR;  Service: Orthopedics;  Laterality: Left;  . AMPUTATION Left 07/10/2019   Procedure: LEFT FOOT 1ST RAY AMPUTATION;  Surgeon: [name], MD;  Location: [facility] OR;  Service: Orthopedics;  Laterality: Left;  . AMPUTATION Right 05/25/2020   Procedure: RIGHT GREAT TOE AMPUTATION;  Surgeon: [name], MD;  Location: [facility] OR;  Service: Orthopedics;  Laterality: Right;  . AMPUTATION Left 01/25/2021   Procedure: LEFT 2ND TOE AMPUTATION;  Surgeon: [name], MD;  Location: [facility] OR;  Service: Orthopedics;  Laterality: Left;  . AMPUTATION Left 02/08/2021    Procedure: LEFT TRANSMETATARSAL AMPUTATION;  Surgeon: [name], MD;  Location: [facility] OR;  Service: Orthopedics;  Laterality: Left;  . AMPUTATION Left 02/10/2021   Procedure: AMPUTATION BELOW KNEE;  Surgeon: [name], MD;  Location: [facility] OR;  Service: Orthopedics;  Laterality: Left;  . NO PAST SURGERIES    . RIGHT/LEFT HEART CATH AND CORONARY ANGIOGRAPHY N/A 01/02/2021   Procedure: RIGHT/LEFT HEART CATH AND CORONARY ANGIOGRAPHY;  Surgeon: Runell Gess, MD;  Location: [facility] INVASIVE [facility] LAB;  Service: Cardiovascular;  Laterality: N/A;  Social History   Occupational History  . Not on file  Tobacco Use  . Smoking status: Current Every Day Smoker    Types: Cigars, Cigarettes  . Smokeless tobacco: Former Neurosurgeon    Types: Chew  . Tobacco comment: 6 daily  Vaping Use  . Vaping Use: Never used  Substance and Sexual Activity  . Alcohol use: Yes    Alcohol/week: 11.0 standard drinks    Types: 3 Cans of beer, 8 Shots of liquor per week    Comment: occasional  . Drug use: Yes    Types: Marijuana    Comment: ocassional - last time 05/08/2020  . Sexual activity: Not Currently

## 2021-02-24 ENCOUNTER — Other Ambulatory Visit: Payer: Self-pay

## 2021-02-24 ENCOUNTER — Telehealth (HOSPITAL_COMMUNITY): Payer: Self-pay

## 2021-02-24 ENCOUNTER — Other Ambulatory Visit: Payer: Self-pay | Admitting: *Deleted

## 2021-02-24 DIAGNOSIS — E1152 Type 2 diabetes mellitus with diabetic peripheral angiopathy with gangrene: Secondary | ICD-10-CM | POA: Diagnosis not present

## 2021-02-24 DIAGNOSIS — R69 Illness, unspecified: Secondary | ICD-10-CM | POA: Diagnosis not present

## 2021-02-24 NOTE — Patient Outreach (Signed)
Medicaid Managed Care   Nurse Care Manager Note  02/24/2021 Name:  KENTO GOSSMAN MRN:  [id number] DOB:  18-May-1956  THANH MOTTERN is an 65 y.o. year old male who is a primary patient of [name], MD.  The [facility] Managed Care Coordination team was consulted for assistance with:    CHF HTN DMII and recent BKA  Mr. Band was given information about Medicaid Managed Care Coordination team services today. [name] agreed to services and verbal consent obtained.  Engaged with patient by telephone for follow up visit in response to provider referral for case management and/or care coordination services.   Assessments/Interventions:  Review of past medical history, allergies, medications, health status, including review of consultants reports, laboratory and other test data, was performed as part of comprehensive evaluation and provision of chronic care management services.  SDOH (Social Determinants of Health) assessments and interventions performed:   Care Plan  Allergies  Allergen Reactions  . Bee Venom Hives, Itching and Swelling       Patient Active Problem List   Diagnosis Date Noted  . Status post below-knee amputation (HCC) 02/17/2021  . Wound dehiscence   . Gangrene of toe of left foot (HCC)   . Cutaneous abscess of left foot   . Left foot infection 02/07/2021  . Leukocytosis 02/07/2021  . Thrombocytosis 02/07/2021  . Hyponatremia 02/07/2021  . AKI (acute kidney injury) (HCC) 02/07/2021  . Hyperglycemia due to diabetes mellitus (HCC) 02/07/2021  . Hypoalbuminemia due to protein-calorie malnutrition (HCC) 02/07/2021  . Osteomyelitis of second toe of left foot (HCC)   . New onset of congestive heart failure (HCC) 12/26/2020  . CKD (chronic kidney disease) stage 3, GFR 30-59 ml/min (HCC) 12/26/2020  . Acute systolic CHF (congestive heart failure) (HCC) 12/26/2020  . Anemia, chronic disease 06/22/2020  . Amputee, great toe, right (HCC) 06/20/2020  .  Normocytic anemia 06/20/2020  . Erectile dysfunction associated with type 2 diabetes mellitus (HCC) 06/20/2020  . Positive for macroalbuminuria 10/24/2019  . Amputation of left great toe (HCC) 10/23/2019  . Tobacco dependence 10/23/2019  . Osteomyelitis of great toe of right foot (HCC) 06/04/2019  . Diabetic foot infection (HCC)   . Noncompliance   . Glaucoma   . Hyperlipidemia   . Essential hypertension 11/06/2003  . Diabetes mellitus (HCC) 11/05/1997    Conditions to be addressed/monitored per PCP order:  CHF, HTN, DMII and BKA  Care Plan : General Plan of Care (Adult)  Updates made by Heidi Dach, RN since 02/24/2021 12:00 AM    Problem: Health Promotion or Disease Self-Management (General Plan of Care)     Long-Range Goal: Self-Management Plan Developed   Start Date: 02/17/2021  Expected End Date: 05/19/2021  Recent Progress: On track  Priority: High  Note:   Current Barriers:   Ineffective Self Health Maintenance-Mr. Nordin 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 He has a rolling walker and manual wheelchair, he will begin having meal delivery today for 10 days, he was approved for 14 hours/wk for PCS, [name], EMT is managing/preparing his medications, he is waiting on PT to be scheduled. He is concerned about his finanaces. He will no longer be able to work and is unsure how he can manage all of his bills.-Update-Mr. Gangemi has not started PT at home. He has concern regarding a letter from [employer] regarding his disability and medicaid. Mr. Schoffstall is receiving PCS at this time. He is working with Harrold Donath for  medication management and will have his medicines delivered and packaged to assist with compliance.  Unable to independently manage health care related to recent BKA  Does not attend all scheduled provider appointments  Unable to perform ADLs independently  Unable to perform IADLs independently  Does not contact provider office for  questions/concerns  Financial insecurity  Currently UNABLE TO independently self manage needs related to chronic health conditions.   Knowledge Deficits related to short term plan for care coordination needs and long term plans for chronic disease management needs Nurse Case Manager Clinical Goal(s):   patient will work with care management team to address care coordination and chronic disease management needs related to recent BKA   Interventions:   Evaluation of current treatment plan related to BKA and patient's adherence to plan as established by provider.  Discussed plans with patient for ongoing care management follow up and provided patient with direct contact information for care management team  Provided patient with printed educational materials related to diabetic diet  Discussed the importance of compliance with diabetic diet and keeping BS at normal levels for good healing  Provided therapeutic listening  Collaborated with [name] regarding home PT services-RNCM is waiting for a return call from representative  Encouraged patient to call today, regarding the letter related to disability/medicaid  Reviewed upcoming scheduled appointments including 03/30/21 with PCP and discussed the importance of keeping all appointments Self Care Activities:  . Patient will self administer medications as prescribed . Patient will attend all scheduled provider appointments . Patient will call provider office for new concerns or questions . Patient will work with BSW to address care coordination needs and will continue to work with the clinical team to address health care and disease management related needs.   . Patient will work with [facility] Pharmacist, Harrold Donath for medication management Patient Goals: - begin a notebook of services in my neighborhood or community - call 211 when I need some help - follow-up on any referrals for help I am given - make a list of family or friends that I can  call  - arrange a ride through an agency 1 week before appointment - ask family or friend for a ride - keep a calendar with appointment dates  Follow Up Plan: Telephone follow up appointment with care management team member scheduled for:03/20/21 @ 9am      Follow Up:  Patient agrees to Care Plan and Follow-up.  Plan: The Managed Medicaid care management team will reach out to the patient again over the next 14 days.  Date/time of next scheduled RN care management/care coordination outreach:  03/20/21 @ 9am  Estanislado Emms RN, BSN [place]  [employer]

## 2021-02-24 NOTE — Patient Instructions (Signed)
Visit Information  Scott Greer was given information about Medicaid Managed Care team care coordination services as a part of their [facility] Medicaid benefit. Scott Greer verbally consented to engagement with the [facility] Managed Care team.   For questions related to your [facility] health plan, please call: [phone]  If you would like to schedule transportation through your [facility] plan, please call the following number at least 2 days in advance of your appointment: [phone]   Call the [facility] at [phone], at any time, 24 hours a day, 7 days a week. If you are in danger or need immediate medical attention call 911.  Scott Greer - following are the goals we discussed in your visit today:  Goals Addressed            This Visit's Progress   . Find Help in My Community       Timeframe:  Long-Range Goal Priority:  High Start Date:   02/17/21                          Expected End Date: 05/19/21                    Follow Up Date 03/20/21   - begin a notebook of services in my neighborhood or community - call 211 when I need some help - follow-up on any referrals for help I am given - make a list of family or friends that I can call    Why is this important?    Knowing how and where to find help for yourself or family in your neighborhood and community is an important skill.   You will want to take some steps to learn how.        . Make and Keep All Appointments       Timeframe:  Long-Range Goal Priority:  High Start Date:    02/17/21                         Expected End Date:   05/19/21                    Follow Up Date 03/20/21   - arrange a ride through an agency 1 week before appointment - ask family or friend for a ride - keep a calendar with appointment dates    Why is this important?    Part of staying healthy is seeing the doctor for follow-up care.   If you forget your appointments, there are some things you can  do to stay on track.           Please see education materials related to healthy diet provided as print materials.     Healthy Eating Following a healthy eating pattern may help you to achieve and maintain a healthy body weight, reduce the risk of chronic disease, and live a long and productive life. It is important to follow a healthy eating pattern at an appropriate calorie level for your body. Your nutritional needs should be met primarily through food by choosing a variety of nutrient-rich foods. What are tips for following this plan? Reading food labels  Read labels and choose the following: ? Reduced or low sodium. ? Juices with 100% fruit juice. ? Foods with low saturated fats and high polyunsaturated and monounsaturated fats. ? Foods with whole grains, such as whole wheat, cracked wheat,  brown rice, and wild rice. ? Whole grains that are fortified with folic acid. This is recommended for women who are pregnant or who want to become pregnant.  Read labels and avoid the following: ? Foods with a lot of added sugars. These include foods that contain brown sugar, corn sweetener, corn syrup, dextrose, fructose, glucose, high-fructose corn syrup, honey, invert sugar, lactose, malt syrup, maltose, molasses, raw sugar, sucrose, trehalose, or turbinado sugar.  Do not eat more than the following amounts of added sugar per day:  6 teaspoons (25 g) for women.  9 teaspoons (38 g) for men. ? Foods that contain processed or refined starches and grains. ? Refined grain products, such as white flour, degermed cornmeal, white bread, and white rice. Shopping  Choose nutrient-rich snacks, such as vegetables, whole fruits, and nuts. Avoid high-calorie and high-sugar snacks, such as potato chips, fruit snacks, and candy.  Use oil-based dressings and spreads on foods instead of solid fats such as butter, stick margarine, or cream cheese.  Limit pre-made sauces, mixes, and "instant" products  such as flavored rice, instant noodles, and ready-made pasta.  Try more plant-protein sources, such as tofu, tempeh, black beans, edamame, lentils, nuts, and seeds.  Explore eating plans such as the Mediterranean diet or vegetarian diet. Cooking  Use oil to saut or stir-fry foods instead of solid fats such as butter, stick margarine, or lard.  Try baking, boiling, grilling, or broiling instead of frying.  Remove the fatty part of meats before cooking.  Steam vegetables in water or broth. Meal planning  At meals, imagine dividing your plate into fourths: ? One-half of your plate is fruits and vegetables. ? One-fourth of your plate is whole grains. ? One-fourth of your plate is protein, especially lean meats, poultry, eggs, tofu, beans, or nuts.  Include low-fat dairy as part of your daily diet.   Lifestyle  Choose healthy options in all settings, including home, work, school, restaurants, or stores.  Prepare your food safely: ? Wash your hands after handling raw meats. ? Keep food preparation surfaces clean by regularly washing with hot, soapy water. ? Keep raw meats separate from ready-to-eat foods, such as fruits and vegetables. ? Cook seafood, meat, poultry, and eggs to the recommended internal temperature. ? Store foods at safe temperatures. In general:  Keep cold foods at 32F (4.4C) or below.  Keep hot foods at 132F (60C) or above.  Keep your freezer at [facility] (-17.8C) or below.  Foods are no longer safe to eat when they have been between the temperatures of 40-132F (4.4-60C) for more than 2 hours. What foods should I eat? Fruits Aim to eat 2 cup-equivalents of fresh, canned (in natural juice), or frozen fruits each day. Examples of 1 cup-equivalent of fruit include 1 small apple, 8 large strawberries, 1 cup canned fruit,  cup dried fruit, or 1 cup 100% juice. Vegetables Aim to eat 2-3 cup-equivalents of fresh and frozen vegetables each day, including different  varieties and colors. Examples of 1 cup-equivalent of vegetables include 2 medium carrots, 2 cups raw, leafy greens, 1 cup chopped vegetable (raw or cooked), or 1 medium baked potato. Grains Aim to eat 6 ounce-equivalents of whole grains each day. Examples of 1 ounce-equivalent of grains include 1 slice of bread, 1 cup ready-to-eat cereal, 3 cups popcorn, or  cup cooked rice, pasta, or cereal. Meats and other proteins Aim to eat 5-6 ounce-equivalents of protein each day. Examples of 1 ounce-equivalent of protein include 1 egg, 1/2 cup  nuts or seeds, or 1 tablespoon (16 g) peanut butter. A cut of meat or fish that is the size of a deck of cards is about 3-4 ounce-equivalents.  Of the protein you eat each week, try to have at least 8 ounces come from seafood. This includes salmon, trout, herring, and anchovies. Dairy Aim to eat 3 cup-equivalents of fat-free or low-fat dairy each day. Examples of 1 cup-equivalent of dairy include 1 cup (240 mL) milk, 8 ounces (250 g) yogurt, 1 ounces (44 g) natural cheese, or 1 cup (240 mL) fortified soy milk. Fats and oils  Aim for about 5 teaspoons (21 g) per day. Choose monounsaturated fats, such as canola and olive oils, avocados, peanut butter, and most nuts, or polyunsaturated fats, such as sunflower, corn, and soybean oils, walnuts, pine nuts, sesame seeds, sunflower seeds, and flaxseed. Beverages  Aim for six 8-oz glasses of water per day. Limit coffee to three to five 8-oz cups per day.  Limit caffeinated beverages that have added calories, such as soda and energy drinks.  Limit alcohol intake to no more than 1 drink a day for nonpregnant women and 2 drinks a day for men. One drink equals 12 oz of beer (355 mL), 5 oz of wine (148 mL), or 1 oz of hard liquor (44 mL). Seasoning and other foods  Avoid adding excess amounts of salt to your foods. Try flavoring foods with herbs and spices instead of salt.  Avoid adding sugar to foods.  Try using  oil-based dressings, sauces, and spreads instead of solid fats. This information is based on general [place] nutrition guidelines. For more information, visit [employer].es. Exact amounts may vary based on your nutrition needs. Summary  A healthy eating plan may help you to maintain a healthy weight, reduce the risk of chronic diseases, and stay active throughout your life.  Plan your meals. Make sure you eat the right portions of a variety of nutrient-rich foods.  Try baking, boiling, grilling, or broiling instead of frying.  Choose healthy options in all settings, including home, work, school, restaurants, or stores. This information is not intended to replace advice given to you by your health care provider. Make sure you discuss any questions you have with your health care provider. Document Revised: 02/03/2018 Document Reviewed: 02/03/2018 Elsevier Patient Education  [place].   The patient verbalized understanding of instructions provided today and agreed to receive a mailed copy of patient instruction and/or educational materials.  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 He has a rolling walker and manual wheelchair, he will begin having meal delivery today for 10 days, he was approved for 14 hours/wk for PCS, [name], EMT is managing/preparing his  medications, he is waiting on PT to be scheduled. He is concerned about his finanaces. He will no longer be able to work and is unsure how he can manage all  of his bills.-Update-Scott Greer has not started PT at home. He has concern regarding a letter from [employer] regarding his disability and medicaid. Scott Greer is receiving PCS at this time. He is working with [name] for medication management and will have his medicines delivered and packaged to assist with compliance.  Unable to independently manage health care related to recent BKA  Does not attend all scheduled provider appointments  Unable to perform ADLs independently  Unable to perform IADLs independently  Does not contact provider office for questions/concerns  Financial insecurity  Currently UNABLE TO independently self manage needs related to chronic health conditions.   Knowledge Deficits related to short term plan for care coordination needs and long term plans for chronic disease management needs Nurse Case Manager Clinical Goal(s):   patient will work with care management team to address care coordination and chronic disease management needs related to recent BKA   Interventions:   Evaluation of current treatment plan related to BKA and patient's adherence to plan as established by provider.  Discussed plans with patient for ongoing care management follow up and provided patient with direct contact information for care management team  Provided patient with printed educational materials related to diabetic diet  Discussed the importance of compliance with diabetic diet and keeping BS at normal levels for good healing  Provided therapeutic listening  Collaborated with Alvis Lemmings regarding home PT services-RNCM is waiting for a return call from representative  Encouraged patient to call today, regarding the letter related to disability/medicaid  Reviewed upcoming scheduled appointments including 03/30/21 with PCP and discussed  the importance of keeping all appointments Self Care Activities:  . Patient will self administer medications as prescribed . Patient will attend all scheduled provider appointments . Patient will call provider office for new concerns or questions . Patient will work with BSW to address care coordination needs and will continue to work with the clinical team to address health care and disease management related needs.   . Patient will work with [facility] Pharmacist, [name] for medication management Patient Goals: - begin a notebook of services in my neighborhood or community - call 211 when I need some help - follow-up on any referrals for help I am given - make a list of family or friends that I can call  - arrange a ride through an agency 1 week before appointment - ask family or friend for a ride - keep a calendar with appointment dates  Follow Up Plan: Telephone follow up appointment with care management team member scheduled for:03/20/21 @ 9am    Patient Care Plan: Medication Management    Problem Identified: Health Promotion or Disease Self-Management (General Plan of Care)     Goal: Medication Management   Note:   Current Barriers:  . Unable to independently monitor therapeutic efficacy . Unable to self administer medications as prescribed . Does not adhere to prescribed medication regimen . Does not maintain contact with provider office . Does not contact provider office for questions/concerns .   Pharmacist Clinical Goal(s):  Marland Kitchen Over the next 30 days, patient will contact provider office for questions/concerns as evidenced notation of same in electronic health record through collaboration with PharmD and provider.  .   Interventions: . Inter-disciplinary care team collaboration (see longitudinal plan of care) . Comprehensive medication review performed; medication list updated in electronic medical record  @RXCPDIABETES @ @RXCPHYPERTENSION @ @RXCPHYPERLIPIDEMIA @  Patient  Goals/Self-Care Activities . Over the next 30 days, patient will:  - take medications as prescribed  Follow Up Plan: The care management team will reach out to the patient again over the next 30 days.

## 2021-02-24 NOTE — Telephone Encounter (Signed)
Left message for Mr. Durnil to return my call for scheduling home visit for next week. Will continue to reach out.

## 2021-02-25 DIAGNOSIS — E1152 Type 2 diabetes mellitus with diabetic peripheral angiopathy with gangrene: Secondary | ICD-10-CM | POA: Diagnosis not present

## 2021-02-25 DIAGNOSIS — R69 Illness, unspecified: Secondary | ICD-10-CM | POA: Diagnosis not present

## 2021-02-27 ENCOUNTER — Other Ambulatory Visit (HOSPITAL_COMMUNITY): Payer: Self-pay | Admitting: *Deleted

## 2021-02-27 DIAGNOSIS — E782 Mixed hyperlipidemia: Secondary | ICD-10-CM

## 2021-02-27 MED ORDER — DIGOXIN 125 MCG PO TABS: 0.1250 mg | ORAL_TABLET | Freq: Every day | ORAL | 6 refills | Status: DC

## 2021-02-27 MED ORDER — TORSEMIDE 20 MG PO TABS: 20.0000 mg | ORAL_TABLET | Freq: Every day | ORAL | 2 refills | Status: DC

## 2021-02-27 MED ORDER — AMIODARONE HCL 200 MG PO TABS: 200.0000 mg | ORAL_TABLET | Freq: Every day | ORAL | 6 refills | Status: DC

## 2021-02-27 MED ORDER — CARVEDILOL 3.125 MG PO TABS: 3.1250 mg | ORAL_TABLET | Freq: Two times a day (BID) | ORAL | 2 refills | Status: DC

## 2021-02-27 MED ORDER — ATORVASTATIN CALCIUM 10 MG PO TABS: 10.0000 mg | ORAL_TABLET | Freq: Every day | ORAL | 6 refills | Status: DC

## 2021-02-27 MED ORDER — ASPIRIN 81 MG PO CHEW: 81.0000 mg | CHEWABLE_TABLET | Freq: Every day | ORAL | 6 refills | Status: DC

## 2021-02-27 MED ORDER — ISOSORB DINITRATE-HYDRALAZINE 20-37.5 MG PO TABS: 1.0000 | ORAL_TABLET | Freq: Three times a day (TID) | ORAL | 2 refills | Status: AC

## 2021-02-27 MED ORDER — EMPAGLIFLOZIN 10 MG PO TABS: 10.0000 mg | ORAL_TABLET | Freq: Every day | ORAL | 6 refills | Status: DC

## 2021-02-27 MED ORDER — SACUBITRIL-VALSARTAN 24-26 MG PO TABS: 1.0000 | ORAL_TABLET | Freq: Two times a day (BID) | ORAL | 2 refills | Status: DC

## 2021-02-27 MED ORDER — POTASSIUM CHLORIDE CRYS ER 20 MEQ PO TBCR: EXTENDED_RELEASE_TABLET | ORAL | 2 refills | Status: DC

## 2021-02-27 MED ORDER — SPIRONOLACTONE 25 MG PO TABS
1.0000 | ORAL_TABLET | Freq: Every day | ORAL | 2 refills | Status: DC
Start: 2021-02-27 — End: 2021-06-22

## 2021-02-28 ENCOUNTER — Encounter: Payer: Self-pay | Admitting: Orthopedic Surgery

## 2021-02-28 ENCOUNTER — Ambulatory Visit (INDEPENDENT_AMBULATORY_CARE_PROVIDER_SITE_OTHER): Payer: Self-pay | Admitting: Physician Assistant

## 2021-02-28 DIAGNOSIS — E1152 Type 2 diabetes mellitus with diabetic peripheral angiopathy with gangrene: Secondary | ICD-10-CM | POA: Diagnosis not present

## 2021-02-28 DIAGNOSIS — T8781 Dehiscence of amputation stump: Secondary | ICD-10-CM

## 2021-02-28 MED ORDER — OXYCODONE-ACETAMINOPHEN 10-325 MG PO TABS: 1.0000 | ORAL_TABLET | ORAL | 0 refills | Status: DC | PRN

## 2021-02-28 NOTE — Progress Notes (Signed)
Office Visit Note   Patient: Scott Greer           Date of Birth: 02/07/1956           MRN: [id number] Visit Date: 02/28/2021              Requested by: [name], MD [address] [place],  [place] [address] PCP: [name], MD  Chief Complaint  Patient presents with  . Left Leg - Wound Check      HPI: Patient is a pleasant gentleman who is little over week status post left below-knee amputation.  He did have a fall on his stump but is now doing better.  He has been wearing a shrinker.  He has been wearing an Ace wrap underneath  Assessment & Plan: Visit Diagnoses: No diagnosis found.  Plan: Have instructed him in daily cleansing with soap and water should apply it clean shrinker every day and wash the other 1 follow-up in a week  Follow-Up Instructions: No follow-ups on file.   Ortho Exam  Patient is alert, oriented, no adenopathy, well-dressed, normal affect, normal respiratory effort. Below-knee amputation stump apposed wound edges no wound dehiscence a little bit of bloody bleeding moderate soft tissue swelling but no ascending cellulitis or signs of infection  Imaging: No results found. No images are attached to the encounter.  Labs: Lab Results  Component Value Date   HGBA1C 9.1 (H) 02/08/2021   HGBA1C 8.6 (H) 02/07/2021   HGBA1C 8.4 (A) 12/14/2020   ESRSEDRATE 78 (H) 06/04/2019   CRP 4.5 (H) 06/04/2019   REPTSTATUS 02/11/2021 FINAL 02/06/2021   REPTSTATUS 02/11/2021 FINAL 02/06/2021   CULT  02/06/2021    NO GROWTH 5 DAYS Performed at [facility], [address] [place], [place] [address]    CULT  02/06/2021    NO GROWTH 5 DAYS Performed at [facility], [address], [place], [place] [address]      Lab Results  Component Value Date   ALBUMIN 2.3 (L) 02/07/2021   ALBUMIN 2.5 (L) 02/06/2021   ALBUMIN 3.1 (L) 12/25/2020   PREALBUMIN 13.8 (L) 06/04/2019    Lab Results  Component Value Date   MG 1.8  02/07/2021   MG 2.0 01/05/2021   MG 2.1 01/04/2021   Lab Results  Component Value Date   VD25OH 9.2 (L) 12/14/2020    Lab Results  Component Value Date   PREALBUMIN 13.8 (L) 06/04/2019   CBC EXTENDED Latest Ref Rng & Units 02/15/2021 02/14/2021 02/13/2021  WBC 4.0 - 10.5 K/uL 10.7(H) 10.3 13.4(H)  RBC 4.22 - 5.81 MIL/uL 3.21(L) 3.30(L) 3.27(L)  HGB 13.0 - 17.0 g/dL 1.7(O) 8.9(L) 8.8(L)  HCT 39.0 - 52.0 % 26.9(L) 27.6(L) 27.3(L)  PLT 150 - 400 K/uL 488(H) 485(H) 480(H)  NEUTROABS 1.7 - 7.7 K/uL - - -  LYMPHSABS 0.7 - 4.0 K/uL - - -     There is no height or weight on file to calculate BMI.  Orders:  No orders of the defined types were placed in this encounter.  Meds ordered this encounter  Medications  . oxyCODONE-acetaminophen (PERCOCET) 10-325 MG tablet    Sig: Take 1 tablet by mouth every 4 (four) hours as needed for pain.    Dispense:  30 tablet    Refill:  0     Procedures: No procedures performed  Clinical Data: No additional findings.  ROS:  All other systems negative, except as noted in the  HPI. Review of Systems  Objective: Vital Signs: There were no vitals taken for this visit.  Specialty Comments:  No specialty comments available.  PMFS History: Patient Active Problem List   Diagnosis Date Noted  . Status post below-knee amputation (HCC) 02/17/2021  . Wound dehiscence   . Gangrene of toe of left foot (HCC)   . Cutaneous abscess of left foot   . Left foot infection 02/07/2021  . Leukocytosis 02/07/2021  . Thrombocytosis 02/07/2021  . Hyponatremia 02/07/2021  . AKI (acute kidney injury) (HCC) 02/07/2021  . Hyperglycemia due to diabetes mellitus (HCC) 02/07/2021  . Hypoalbuminemia due to protein-calorie malnutrition (HCC) 02/07/2021  . Osteomyelitis of second toe of left foot (HCC)   . New onset of congestive heart failure (HCC) 12/26/2020  . CKD (chronic kidney disease) stage 3, GFR 30-59 ml/min (HCC) 12/26/2020  . Acute systolic CHF  (congestive heart failure) (HCC) 12/26/2020  . Anemia, chronic disease 06/22/2020  . Amputee, great toe, right (HCC) 06/20/2020  . Normocytic anemia 06/20/2020  . Erectile dysfunction associated with type 2 diabetes mellitus (HCC) 06/20/2020  . Positive for macroalbuminuria 10/24/2019  . Amputation of left great toe (HCC) 10/23/2019  . Tobacco dependence 10/23/2019  . Osteomyelitis of great toe of right foot (HCC) 06/04/2019  . Diabetic foot infection (HCC)   . Noncompliance   . Glaucoma   . Hyperlipidemia   . Essential hypertension 11/06/2003  . Diabetes mellitus (HCC) 11/05/1997   Past Medical History:  Diagnosis Date  . Dehiscence of amputation stump (HCC)    left great toe  . Diabetes mellitus without complication (HCC) 1999   Type II  . Glaucoma 2015  . Hyperlipidemia   . Hypertension 2005  . Left foot infection 02/07/2021  . Osteomyelitis (HCC)   . Wears glasses     Family History  Problem Relation Age of Onset  . Stroke Mother   . Diabetes Mother        Toward end of life    Past Surgical History:  Procedure Laterality Date  . AMPUTATION Left 06/05/2019   Procedure: LEFT GREAT TOE AMPUTATION;  Surgeon: [name], MD;  Location: [facility] OR;  Service: Orthopedics;  Laterality: Left;  . AMPUTATION Left 07/10/2019   Procedure: LEFT FOOT 1ST RAY AMPUTATION;  Surgeon: [name], MD;  Location: [facility] OR;  Service: Orthopedics;  Laterality: Left;  . AMPUTATION Right 05/25/2020   Procedure: RIGHT GREAT TOE AMPUTATION;  Surgeon: [name], MD;  Location: [facility] OR;  Service: Orthopedics;  Laterality: Right;  . AMPUTATION Left 01/25/2021   Procedure: LEFT 2ND TOE AMPUTATION;  Surgeon: [name], MD;  Location: [facility] OR;  Service: Orthopedics;  Laterality: Left;  . AMPUTATION Left 02/08/2021   Procedure: LEFT TRANSMETATARSAL AMPUTATION;  Surgeon: [name], MD;  Location: [facility] OR;  Service: Orthopedics;  Laterality: Left;  . AMPUTATION Left 02/10/2021   Procedure: AMPUTATION  BELOW KNEE;  Surgeon: [name], MD;  Location: [facility] OR;  Service: Orthopedics;  Laterality: Left;  . NO PAST SURGERIES    . RIGHT/LEFT HEART CATH AND CORONARY ANGIOGRAPHY N/A 01/02/2021   Procedure: RIGHT/LEFT HEART CATH AND CORONARY ANGIOGRAPHY;  Surgeon: Runell Gess, MD;  Location: [facility] INVASIVE CV LAB;  Service: Cardiovascular;  Laterality: N/A;   Social History   Occupational History  . Not on file  Tobacco Use  . Smoking status: Current Every Day Smoker    Types: Cigars, Cigarettes  . Smokeless tobacco: Former Neurosurgeon  Types: Chew  . Tobacco comment: 6 daily  Vaping Use  . Vaping Use: Never used  Substance and Sexual Activity  . Alcohol use: Yes    Alcohol/week: 11.0 standard drinks    Types: 3 Cans of beer, 8 Shots of liquor per week    Comment: occasional  . Drug use: Yes    Types: Marijuana    Comment: ocassional - last time 05/08/2020  . Sexual activity: Not Currently

## 2021-03-01 ENCOUNTER — Ambulatory Visit: Payer: Medicaid Other

## 2021-03-01 DIAGNOSIS — E1152 Type 2 diabetes mellitus with diabetic peripheral angiopathy with gangrene: Secondary | ICD-10-CM | POA: Diagnosis not present

## 2021-03-02 ENCOUNTER — Other Ambulatory Visit (HOSPITAL_COMMUNITY): Payer: Self-pay

## 2021-03-02 DIAGNOSIS — E1152 Type 2 diabetes mellitus with diabetic peripheral angiopathy with gangrene: Secondary | ICD-10-CM | POA: Diagnosis not present

## 2021-03-02 NOTE — Progress Notes (Signed)
Paramedicine Encounter    Patient ID: Scott Greer, male    DOB: [birth year], 65 y.o.   MRN: [id number]   Patient Care Team: [name], MD as PCP - General (Internal Medicine) [name], [name], MD as PCP - General (Cardiology) [name], [name], MD as PCP - Cardiology (Cardiology) [name], RN as Case Manager Lane Hacker, [facility] as Pharmacist (Pharmacist)  Patient Active Problem List   Diagnosis Date Noted  . Status post below-knee amputation ([place]) 02/17/2021  . Wound dehiscence   . Gangrene of toe of left foot ([place])   . Cutaneous abscess of left foot   . Left foot infection 02/07/2021  . Leukocytosis 02/07/2021  . Thrombocytosis 02/07/2021  . Hyponatremia 02/07/2021  . AKI (acute kidney injury) ([place]) 02/07/2021  . Hyperglycemia due to diabetes mellitus ([place]) 02/07/2021  . Hypoalbuminemia due to protein-calorie malnutrition ([place]) 02/07/2021  . Osteomyelitis of second toe of left foot ([place])   . New onset of congestive heart failure ([place]) 12/26/2020  . CKD (chronic kidney disease) stage 3, GFR 30-59 ml/min (HCC) 12/26/2020  . Acute systolic CHF (congestive heart failure) ([place]) 12/26/2020  . Anemia, chronic disease 06/22/2020  . Amputee, great toe, right ([place]) 06/20/2020  . Normocytic anemia 06/20/2020  . Erectile dysfunction associated with type 2 diabetes mellitus ([place]) 06/20/2020  . Positive for macroalbuminuria 10/24/2019  . Amputation of left great toe ([place]) 10/23/2019  . Tobacco dependence 10/23/2019  . Osteomyelitis of great toe of right foot ([place]) 06/04/2019  . Diabetic foot infection ([place])   . Noncompliance   . Glaucoma   . Hyperlipidemia   . Essential hypertension 11/06/2003  . Diabetes mellitus ([place]) 11/05/1997    Current Outpatient Medications:  .  amiodarone (PACERONE) 200 MG tablet, Take 1 tablet (200 mg total) by mouth daily., Disp: 30 tablet, Rfl: 6 .  aspirin 81 MG chewable tablet, Chew 1 tablet (81 mg total) by mouth daily., Disp: 30  tablet, Rfl: 6 .  atorvastatin (LIPITOR) 10 MG tablet, Take 1 tablet (10 mg total) by mouth daily., Disp: 30 tablet, Rfl: 6 .  Blood Glucose Monitoring Suppl (TRUE METRIX METER) w/Device KIT, Use as directed, Disp: 1 kit, Rfl: 0 .  carvedilol (COREG) 3.125 MG tablet, Take 1 tablet (3.125 mg total) by mouth 2 (two) times daily with a meal., Disp: 60 tablet, Rfl: 2 .  digoxin (LANOXIN) 0.125 MG tablet, Take 1 tablet (0.125 mg total) by mouth daily., Disp: 30 tablet, Rfl: 6 .  empagliflozin (JARDIANCE) 10 MG TABS tablet, Take 1 tablet (10 mg total) by mouth daily., Disp: 30 tablet, Rfl: 6 .  gabapentin (NEURONTIN) 300 MG capsule, Take 1 capsule (300 mg total) by mouth 3 (three) times daily., Disp: 90 capsule, Rfl: 0 .  glucose blood (TRUE METRIX BLOOD GLUCOSE TEST) test strip, Use as instructed, Disp: 100 each, Rfl: 12 .  isosorbide-hydrALAZINE (BIDIL) 20-37.5 MG tablet, Take 1 tablet by mouth 3 (three) times daily., Disp: 90 tablet, Rfl: 2 .  metFORMIN (GLUCOPHAGE) 500 MG tablet, Take 1 tablet (500 mg total) by mouth 2 (two) times daily with a meal., Disp: 60 tablet, Rfl: 1 .  oxyCODONE-acetaminophen (PERCOCET) 10-325 MG tablet, Take 1 tablet by mouth every 4 (four) hours as needed for pain., Disp: 30 tablet, Rfl: 0 .  potassium chloride SA (KLOR-CON) 20 MEQ tablet, TAKE 1 TABLET (20 MEQ) BY MOUTH DAILY., Disp: 30 tablet, Rfl: 2 .  sacubitril-valsartan (ENTRESTO) 24-26 MG, Take 1 tablet by mouth 2 (  two) times daily., Disp: 60 tablet, Rfl: 2 .  spironolactone (ALDACTONE) 25 MG tablet, TAKE 1 TABLET (25 MG TOTAL) BY MOUTH DAILY., Disp: 30 tablet, Rfl: 2 .  torsemide (DEMADEX) 20 MG tablet, Take 1 tablet (20 mg total) by mouth daily., Disp: 30 tablet, Rfl: 2 .  TRUEplus Lancets 28G MISC, Use as directed, Disp: 100 each, Rfl: 4 .  Vitamin D, Ergocalciferol, (DRISDOL) 1.25 MG (50000 UNIT) CAPS capsule, Take 1 capsule (50,000 Units total) by mouth every 7 (seven) days. (Patient not taking: No sig reported),  Disp: 4 capsule, Rfl: 2 Allergies  Allergen Reactions  . Bee Venom Hives, Itching and Swelling     Social History   Socioeconomic History  . Marital status: Legally Separated    Spouse name: Nira Conn  . Number of children: 2  . Years of education: Not on file  . Highest education level: Associate degree: occupational, Hotel manager, or vocational program  Occupational History  . Not on file  Tobacco Use  . Smoking status: Current Every Day Smoker    Types: Cigars, Cigarettes  . Smokeless tobacco: Former Systems developer    Types: Chew  . Tobacco comment: 6 daily  Vaping Use  . Vaping Use: Never used  Substance and Sexual Activity  . Alcohol use: Yes    Alcohol/week: 11.0 standard drinks    Types: 3 Cans of beer, 8 Shots of liquor per week    Comment: occasional  . Drug use: Yes    Types: Marijuana    Comment: ocassional - last time 05/08/2020  . Sexual activity: Not Currently  Other Topics Concern  . Not on file  Social History Narrative   Out of prison for 2 months.   Lives at home with his wife.   Social Determinants of Health   Financial Resource Strain: High Risk  . Difficulty of Paying Living Expenses: Very hard  Food Insecurity: Food Insecurity Present  . Worried About Charity fundraiser in the Last Year: Sometimes true  . Ran Out of Food in the Last Year: Sometimes true  Transportation Needs: Unmet Transportation Needs  . Lack of Transportation (Medical): Yes  . Lack of Transportation (Non-Medical): Yes  Physical Activity: Inactive  . Days of Exercise per Week: 0 days  . Minutes of Exercise per Session: 0 min  Stress: Not on file  Social Connections: Not on file  Intimate Partner Violence: Not on file    Physical Exam  Met with Scott Greer in the home today to verify medications and fill his pill box. Medications were verified and pill box filled accordingly. [facility]. I will follow up next  week on delivery status of medications as he is need of refills. I also gathered a water, power, and gas bill from Scott Greer for Scott Greer.  Scott Greer reminded of upcoming appointment with Dr. Sharol Given on Tuesday, written reminder left with patient and he agreed to visit.    Patient had a vehicle appointment to get to and was in a hurry to complete visit. I will come back out for full home visit next week.    Refills: Digoxin Amio Atorvastatin Jardiance    Future Appointments  Date Time Provider [place]  03/06/2021 10:15 AM [facility] CCC-MM SOCIAL WORKER 2 [facility] None  03/07/2021  1:30 PM [facility] PA/NP [facility] None  03/07/2021  2:30 PM Newt Minion, MD [facility] None  03/20/2021  9:00 AM [facility]  CCC-MM CARE MANAGER [facility] None  03/30/2021  9:30 AM [name], MD [facility] None     ACTION: Home visit completed Next visit planned for one week

## 2021-03-03 DIAGNOSIS — E1152 Type 2 diabetes mellitus with diabetic peripheral angiopathy with gangrene: Secondary | ICD-10-CM | POA: Diagnosis not present

## 2021-03-03 NOTE — Patient Outreach (Signed)
Medication BB B L D BT  Aspirin Chew 81mg   1     Atorvastatin 10mg      1   Carvedilol 3.125mg   1  1   Digoxin 0.125mg     1   Jardiance 10mg    1    Gabapentin 300mg   1 1 1    Bidil 20-37.5mg   1 1 1    Metformin 500mg   1  1   Oxycodone/APAP 10/325mg         Ptassium Cl Er  1     Entresto 24-26mg   1  1   Spironolactone 25mg   1     Amiodarone 200mg    1    Torsemide 20mg   1     Vitamin D3 1.25mg   1        Above meds due on 03/08/21, patient affirmed. Requested refills from PCP

## 2021-03-04 DIAGNOSIS — E1152 Type 2 diabetes mellitus with diabetic peripheral angiopathy with gangrene: Secondary | ICD-10-CM | POA: Diagnosis not present

## 2021-03-05 DIAGNOSIS — Z419 Encounter for procedure for purposes other than remedying health state, unspecified: Secondary | ICD-10-CM | POA: Diagnosis not present

## 2021-03-06 ENCOUNTER — Other Ambulatory Visit: Payer: Self-pay

## 2021-03-06 NOTE — Patient Outreach (Signed)
Care Coordination  03/06/2021  Scott Greer Mar 22, 1956 [id number]   Medicaid Managed Care   Unsuccessful Outreach Note  03/06/2021 Name: Scott Greer MRN: [id number] DOB: [birth year]  Referred by: [name], MD Reason for referral : High Risk Managed Medicaid (HR MM Unsuccessful Telephone Outreach)   An unsuccessful telephone outreach was attempted today. The patient was referred to the case management team for assistance with care management and care coordination.   Follow Up Plan: The care management team will reach out to the patient again over the next 7 days.   Gus Puma, BSW, [facility]  [place]  High Risk Managed Medicaid Team

## 2021-03-06 NOTE — Patient Instructions (Signed)
Visit Information  Mr. ZACHERY NISWANDER  - as a part of your Medicaid benefit, you are eligible for care management and care coordination services at no cost or copay. I was unable to reach you by phone today but would be happy to help you with your health related needs. Please feel free to call me @ [phone].   A member of the Managed Medicaid care management team will reach out to you again over the next 7 days.  Gus Puma, BSW, [facility]  [place]  High Risk Managed Medicaid Team

## 2021-03-07 ENCOUNTER — Other Ambulatory Visit: Payer: Self-pay | Admitting: Internal Medicine

## 2021-03-07 ENCOUNTER — Ambulatory Visit (INDEPENDENT_AMBULATORY_CARE_PROVIDER_SITE_OTHER): Payer: Medicaid Other | Admitting: Physician Assistant

## 2021-03-07 ENCOUNTER — Encounter (HOSPITAL_COMMUNITY): Payer: Medicaid Other

## 2021-03-07 ENCOUNTER — Other Ambulatory Visit: Payer: Self-pay | Admitting: Physician Assistant

## 2021-03-07 DIAGNOSIS — E559 Vitamin D deficiency, unspecified: Secondary | ICD-10-CM

## 2021-03-07 DIAGNOSIS — E1121 Type 2 diabetes mellitus with diabetic nephropathy: Secondary | ICD-10-CM

## 2021-03-07 DIAGNOSIS — T8781 Dehiscence of amputation stump: Secondary | ICD-10-CM

## 2021-03-07 MED ORDER — GABAPENTIN 300 MG PO CAPS: 300.0000 mg | ORAL_CAPSULE | Freq: Three times a day (TID) | ORAL | 0 refills | Status: DC

## 2021-03-07 MED ORDER — OXYCODONE-ACETAMINOPHEN 5-325 MG PO TABS: 1.0000 | ORAL_TABLET | ORAL | 0 refills | Status: DC | PRN

## 2021-03-07 MED ORDER — VITAMIN D (ERGOCALCIFEROL) 1.25 MG (50000 UNIT) PO CAPS: 50000.0000 [IU] | ORAL_CAPSULE | ORAL | 2 refills | Status: DC

## 2021-03-07 MED ORDER — METFORMIN HCL 500 MG PO TABS: 500.0000 mg | ORAL_TABLET | Freq: Two times a day (BID) | ORAL | 1 refills | Status: DC

## 2021-03-07 NOTE — Progress Notes (Signed)
Office Visit Note   Patient: Scott Greer           Date of Birth: 08-27-1956           MRN: [id number] Visit Date: 03/07/2021              Requested by: [name], MD [address] [place],  [place] [address] PCP: [name], MD  Chief Complaint  Patient presents with  . Left Leg - Routine Post Op    02/10/21 left BKA       HPI: Patient is 3 weeks status post left below-knee amputation.  He said he has been wearing his shrinker.  He also would like to have his right foot checked as he said he is having to hop on it with crutches and he has developed an ulcer beneath his forefoot.  He also states he is driving.  He is requesting a refill of 10 mg Percocet every 4 hours  Assessment & Plan: Visit Diagnoses: No diagnosis found.  Plan: I spoke with the patient and if so that he can have Percocet but we would have to do 5 mg instead of 10 mg.  If he needed more we would consider a pain management.  I have also advised him against driving.  Although it his his left below-knee amputation he is placing pressure on the right foot which will progress the ulcer he is developed.  Also on safe if he is taking narcotics.  I have given him offloading pads and offered him a postop shoe which he does not want He was removing the skin on his toes to make them bleed while in the office. Follow-Up Instructions: No follow-ups on file.   Ortho Exam  Patient is alert, oriented, no adenopathy, well-dressed, normal affect, normal respiratory effort. Left below-knee amputation stump well approximated wound edges no ascending cellulitis moderate soft tissue swelling he is wearing a shrinker surgical staples were harvested today. Left foot he is status post great toe amputation.  This is healed well.  He has a grade 1 ulcer beneath the first metatarsal head that measures about 2 cm in diameter.  It has no tunneling it does not probe deeply to bone and no surrounding cellulitis.  He does have  biphasic dorsalis pedis pulse.  He also has scabbing and scaling of his clawing of his toes.  He had debrided this himself with some bleeding.  A Band-Aid with some mupirocin was placed in this area.  He was provided a prescription to work with [employer] as he does not want to work with Technical sales engineer for his prosthetic Patient is a new left transtibial  amputee.  Patient's current comorbidities are not expected to impact the ability to function with the prescribed prosthesis. Patient verbally communicates a strong desire to use a prosthesis. Patient currently requires mobility aids to ambulate without a prosthesis.  Expects not to use mobility aids with a new prosthesis.  Patient is a K3 level ambulator that spends a lot of time walking around on uneven terrain over obstacles, up and down stairs, and ambulates with a variable cadence.    Imaging: No results found. No images are attached to the encounter.  Labs: Lab Results  Component Value Date   HGBA1C 9.1 (H) 02/08/2021   HGBA1C 8.6 (H) 02/07/2021   HGBA1C 8.4 (A) 12/14/2020   ESRSEDRATE 78 (H) 06/04/2019   CRP 4.5 (H) 06/04/2019   REPTSTATUS 02/11/2021 FINAL 02/06/2021   REPTSTATUS 02/11/2021  FINAL 02/06/2021   CULT  02/06/2021    NO GROWTH 5 DAYS Performed at [facility], [address], [place], [place] [address]    CULT  02/06/2021    NO GROWTH 5 DAYS Performed at [facility], [address], [place], [place] [address]      Lab Results  Component Value Date   ALBUMIN 2.3 (L) 02/07/2021   ALBUMIN 2.5 (L) 02/06/2021   ALBUMIN 3.1 (L) 12/25/2020   PREALBUMIN 13.8 (L) 06/04/2019    Lab Results  Component Value Date   MG 1.8 02/07/2021   MG 2.0 01/05/2021   MG 2.1 01/04/2021   Lab Results  Component Value Date   VD25OH 9.2 (L) 12/14/2020    Lab Results  Component Value Date   PREALBUMIN 13.8 (L) 06/04/2019   CBC EXTENDED Latest Ref Rng & Units 02/15/2021 02/14/2021 02/13/2021  WBC 4.0 - 10.5 K/uL 10.7(H)  10.3 13.4(H)  RBC 4.22 - 5.81 MIL/uL 3.21(L) 3.30(L) 3.27(L)  HGB 13.0 - 17.0 g/dL 0.1(U) 8.9(L) 8.8(L)  HCT 39.0 - 52.0 % 26.9(L) 27.6(L) 27.3(L)  PLT 150 - 400 K/uL 488(H) 485(H) 480(H)  NEUTROABS 1.7 - 7.7 K/uL - - -  LYMPHSABS 0.7 - 4.0 K/uL - - -     There is no height or weight on file to calculate BMI.  Orders:  No orders of the defined types were placed in this encounter.  No orders of the defined types were placed in this encounter.    Procedures: No procedures performed  Clinical Data: No additional findings.  ROS:  All other systems negative, except as noted in the HPI. Review of Systems  Objective: Vital Signs: There were no vitals taken for this visit.  Specialty Comments:  No specialty comments available.  PMFS History: Patient Active Problem List   Diagnosis Date Noted  . Status post below-knee amputation (HCC) 02/17/2021  . Wound dehiscence   . Gangrene of toe of left foot (HCC)   . Cutaneous abscess of left foot   . Left foot infection 02/07/2021  . Leukocytosis 02/07/2021  . Thrombocytosis 02/07/2021  . Hyponatremia 02/07/2021  . AKI (acute kidney injury) (HCC) 02/07/2021  . Hyperglycemia due to diabetes mellitus (HCC) 02/07/2021  . Hypoalbuminemia due to protein-calorie malnutrition (HCC) 02/07/2021  . Osteomyelitis of second toe of left foot (HCC)   . New onset of congestive heart failure (HCC) 12/26/2020  . CKD (chronic kidney disease) stage 3, GFR 30-59 ml/min (HCC) 12/26/2020  . Acute systolic CHF (congestive heart failure) (HCC) 12/26/2020  . Anemia, chronic disease 06/22/2020  . Amputee, great toe, right (HCC) 06/20/2020  . Normocytic anemia 06/20/2020  . Erectile dysfunction associated with type 2 diabetes mellitus (HCC) 06/20/2020  . Positive for macroalbuminuria 10/24/2019  . Amputation of left great toe (HCC) 10/23/2019  . Tobacco dependence 10/23/2019  . Osteomyelitis of great toe of right foot (HCC) 06/04/2019  . Diabetic  foot infection (HCC)   . Noncompliance   . Glaucoma   . Hyperlipidemia   . Essential hypertension 11/06/2003  . Diabetes mellitus (HCC) 11/05/1997   Past Medical History:  Diagnosis Date  . Dehiscence of amputation stump (HCC)    left great toe  . Diabetes mellitus without complication (HCC) 1999   Type II  . Glaucoma 2015  . Hyperlipidemia   . Hypertension 2005  . Left foot infection 02/07/2021  . Osteomyelitis (HCC)   . Wears glasses     Family History  Problem Relation  Age of Onset  . Stroke Mother   . Diabetes Mother        Toward end of life    Past Surgical History:  Procedure Laterality Date  . AMPUTATION Left 06/05/2019   Procedure: LEFT GREAT TOE AMPUTATION;  Surgeon: [name], MD;  Location: [facility] OR;  Service: Orthopedics;  Laterality: Left;  . AMPUTATION Left 07/10/2019   Procedure: LEFT FOOT 1ST RAY AMPUTATION;  Surgeon: [name], MD;  Location[facility] OR;  Service: Orthopedics;  Laterality: Left;  . AMPUTATION Right 05/25/2020   Procedure: RIGHT GREAT TOE AMPUTATION;  Surgeon: [name], MD;  Location: [facility] OR;  Service: Orthopedics;  Laterality: Right;  . AMPUTATION Left 01/25/2021   Procedure: LEFT 2ND TOE AMPUTATION;  Surgeon: [name], MD;  Location[facility] OR;  Service: Orthopedics;  Laterality: Left;  . AMPUTATION Left 02/08/2021   Procedure: LEFT TRANSMETATARSAL AMPUTATION;  Surgeon: [name], MD;  Location: [facility] OR;  Service: Orthopedics;  Laterality: Left;  . AMPUTATION Left 02/10/2021   Procedure: AMPUTATION BELOW KNEE;  Surgeon: [name], MD;  Location: [facility] OR;  Service: Orthopedics;  Laterality: Left;  . NO PAST SURGERIES    . RIGHT/LEFT HEART CATH AND CORONARY ANGIOGRAPHY N/A 01/02/2021   Procedure: RIGHT/LEFT HEART CATH AND CORONARY ANGIOGRAPHY;  Surgeon: Runell Gess, MD;  Location: [facility] INVASIVE CV LAB;  Service: Cardiovascular;  Laterality: N/A;   Social History   Occupational History  . Not on file  Tobacco Use  . Smoking  status: Current Every Day Smoker    Types: Cigars, Cigarettes  . Smokeless tobacco: Former Neurosurgeon    Types: Chew  . Tobacco comment: 6 daily  Vaping Use  . Vaping Use: Never used  Substance and Sexual Activity  . Alcohol use: Yes    Alcohol/week: 11.0 standard drinks    Types: 3 Cans of beer, 8 Shots of liquor per week    Comment: occasional  . Drug use: Yes    Types: Marijuana    Comment: ocassional - last time 05/08/2020  . Sexual activity: Not Currently

## 2021-03-08 ENCOUNTER — Telehealth (HOSPITAL_COMMUNITY): Payer: Self-pay

## 2021-03-08 NOTE — Telephone Encounter (Signed)
Left message for Prospero to return my call for scheduling home visit for tomorrow. Will reach out tomorrow.

## 2021-03-09 ENCOUNTER — Telehealth (HOSPITAL_COMMUNITY): Payer: Self-pay

## 2021-03-09 DIAGNOSIS — I96 Gangrene, not elsewhere classified: Secondary | ICD-10-CM | POA: Diagnosis not present

## 2021-03-09 DIAGNOSIS — L02612 Cutaneous abscess of left foot: Secondary | ICD-10-CM | POA: Diagnosis not present

## 2021-03-09 DIAGNOSIS — L089 Local infection of the skin and subcutaneous tissue, unspecified: Secondary | ICD-10-CM | POA: Diagnosis not present

## 2021-03-09 DIAGNOSIS — T8130XA Disruption of wound, unspecified, initial encounter: Secondary | ICD-10-CM | POA: Diagnosis not present

## 2021-03-09 DIAGNOSIS — E1152 Type 2 diabetes mellitus with diabetic peripheral angiopathy with gangrene: Secondary | ICD-10-CM | POA: Diagnosis not present

## 2021-03-09 NOTE — Telephone Encounter (Signed)
Attempted to reach Scott Greer without success for home visit. I will attempt again next week.

## 2021-03-10 ENCOUNTER — Other Ambulatory Visit: Payer: Self-pay

## 2021-03-10 ENCOUNTER — Telehealth (HOSPITAL_COMMUNITY): Payer: Self-pay | Admitting: Licensed Clinical Social Worker

## 2021-03-10 DIAGNOSIS — E1152 Type 2 diabetes mellitus with diabetic peripheral angiopathy with gangrene: Secondary | ICD-10-CM | POA: Diagnosis not present

## 2021-03-10 NOTE — Telephone Encounter (Signed)
Rent and utility assistance application completed and sent in to [facility] for review.  Will continue to follow and assist as needed  Burna Sis, LCSW Clinical Social Worker [facility] Desk#: [phone] Cell#: [phone]

## 2021-03-10 NOTE — Patient Outreach (Signed)
Patient's meds were delivered in packs on 03/09/21 but vials were also scheduled to be delivered on 03/10/21 of rest of his meds. Coordinated with [facility] to make it so they'll pick up old packs on 03/13/21 and re-package everything for him. F/U 1 month

## 2021-03-13 ENCOUNTER — Telehealth (HOSPITAL_COMMUNITY): Payer: Self-pay | Admitting: Licensed Clinical Social Worker

## 2021-03-13 DIAGNOSIS — E1152 Type 2 diabetes mellitus with diabetic peripheral angiopathy with gangrene: Secondary | ICD-10-CM | POA: Diagnosis not present

## 2021-03-13 NOTE — Patient Outreach (Signed)
Spoke with Bo at [facility], they will pick up patient's meds and re-package tomorrow.Called patient to let him know.

## 2021-03-13 NOTE — Telephone Encounter (Signed)
CSW informed by [employer] that pt is stressed about outstanding bills at this time.  CSW had submitted applications to [facility] on Friday but no updates regarding those applications at this time.  Pt planning to go to [facility] today with updated bills so try and get assistance.  Pt has list of local churches that help with bills when they have funding but hasn't called them yet- CSW encouraged him to call them ASAP.  Pt reports he is behind on water, gas, and rent.  Has been utilizing assistance for rent and utilities for over a year.  CSW discussed needing to apply for other income based options as his current living situation is not sustainable financially.  Pt not interested in this due to thinking those housing options will be in worse areas than where he currently lives.  CSW acknowledged these concerns but informed there are senior options and lots of different communities he can apply for and felt like he could find an area he is more comfortable with.  Is agreeable to CSW helping apply for project based voucher program but didn't want to discuss him pursuing housing options at this time.  Will continue to follow and assist as needed  Burna Sis, LCSW Clinical Social Worker [facility] Desk#: [phone] Cell#: [phone]

## 2021-03-14 DIAGNOSIS — E1152 Type 2 diabetes mellitus with diabetic peripheral angiopathy with gangrene: Secondary | ICD-10-CM | POA: Diagnosis not present

## 2021-03-15 ENCOUNTER — Telehealth (HOSPITAL_COMMUNITY): Payer: Self-pay | Admitting: Licensed Clinical Social Worker

## 2021-03-15 ENCOUNTER — Other Ambulatory Visit: Payer: Self-pay

## 2021-03-15 ENCOUNTER — Telehealth (HOSPITAL_COMMUNITY): Payer: Self-pay

## 2021-03-15 DIAGNOSIS — E1152 Type 2 diabetes mellitus with diabetic peripheral angiopathy with gangrene: Secondary | ICD-10-CM | POA: Diagnosis not present

## 2021-03-15 NOTE — Telephone Encounter (Signed)
Spoke to [employer] and advised him of his appointment in the morning at 0900 and he confirmed he will be there with his medications and pill box. Call complete.

## 2021-03-15 NOTE — Patient Outreach (Signed)
Medicaid Managed Care Social Work Note  03/15/2021 Name:  Scott Greer MRN:  [id number] DOB:  Apr 17, 1956  Scott Greer is an 65 y.o. year old male who is a primary patient of [name], MD.  The Medicaid Managed Care Coordination team was consulted for assistance with:  rent and utility assistance  Mr. Walmsley was given information about Medicaid Managed CareCoordination services today. [name] agreed to services and verbal consent obtained.  Engaged with patient  for by telephone forinitial visit in response to referral for case management and/or care coordination services.   Assessments/Interventions:  Review of past medical history, allergies, medications, health status, including review of consultants reports, laboratory and other test data, was performed as part of comprehensive evaluation and provision of chronic care management services.  SDOH: (Social Determinant of Health) assessments and interventions performed:  . Provided patient with information about DSS utility assistance program. Patient stated that another social worker has completed the application for him and he has not heard anything back from them. Patient states he completed an application with the [employer] and [employer] and has not heard from them. Patient stated he would be contacting the Welfare Reform today. Patient did receive a list of churches from [employer], patient stated he did contact the churches but some he was not able to get through and some did not have any funds. . Patient states he is $1300 behind in rent. Patient currently receives foodstamps. . BSW contacted [facility] at [phone] and was unable to leave a voicemail. BSW will continue to research for rental assistance programs.   Advanced Directives Status:  Not addressed in this encounter.  Care Plan                 Allergies  Allergen Reactions  . Bee Venom Hives, Itching and Swelling     Medications Reviewed Today    Reviewed by Persons, [name], PA (Physician Assistant) on 02/28/21 at 1525  Med List Status: <None>  Medication Order Taking? Sig Documenting Provider Last Dose Status Informant  amiodarone (PACERONE) 200 MG tablet [id number]  Take 1 tablet (200 mg total) by mouth daily. [name], MD  Active   aspirin 81 MG chewable tablet [id number]  Chew 1 tablet (81 mg total) by mouth daily. [name], MD  Active   atorvastatin (LIPITOR) 10 MG tablet [id number]  Take 1 tablet (10 mg total) by mouth daily. [name], MD  Active   Blood Glucose Monitoring Suppl (TRUE METRIX METER) w/Device Drucie Opitz [id number] No Use as directed [name], MD Taking Active Care Giver  carvedilol (COREG) 3.125 MG tablet [id number]  Take 1 tablet (3.125 mg total) by mouth 2 (two) times daily with a meal. [name], MD  Active   digoxin (LANOXIN) 0.125 MG tablet [id number]  Take 1 tablet (0.125 mg total) by mouth daily. [name], MD  Active   empagliflozin (JARDIANCE) 10 MG TABS tablet [id number]  Take 1 tablet (10 mg total) by mouth daily. [name], MD  Active   gabapentin (NEURONTIN) 300 MG capsule [id number] No Take 1 capsule (300 mg total) by mouth 3 (three) times daily. Mariel Aloe, MD Taking Active   glucose blood (TRUE METRIX BLOOD GLUCOSE TEST) test strip [id number] No Use as instructed [name], MD Taking Active Care Giver  isosorbide-hydrALAZINE (BIDIL) 20-37.5 MG tablet [id number]  Take 1 tablet by mouth 3 (three) times  daily. [name], MD  Active   metFORMIN (GLUCOPHAGE) 500 MG tablet [id number] No Take 1 tablet (500 mg total) by mouth 2 (two) times daily with a meal. [name], [name], PA-C Taking Active Care Giver           Med Note [name], [name]   Tue Feb 07, 2021  1:57 AM) LF 12/14/20 #60  oxyCODONE-acetaminophen (PERCOCET) 10-325 MG tablet [id number]  Take 1 tablet by mouth every 4 (four) hours as needed for pain. Persons, [name], [name]  Active   potassium chloride SA (KLOR-CON) 20 MEQ tablet [id number]  TAKE 1 TABLET (20 MEQ) BY MOUTH DAILY. [name], MD  Active   sacubitril-valsartan [facility]) 24-26 [place] [id number]  Take 1 tablet by mouth 2 (two) times daily. [name], MD  Active   spironolactone (ALDACTONE) 25 MG tablet [id number]  TAKE 1 TABLET (25 MG TOTAL) BY MOUTH DAILY. [name], MD  Active   torsemide (DEMADEX) 20 MG tablet [id number]  Take 1 tablet (20 mg total) by mouth daily. [name], MD  Active   TRUEplus Lancets 28G [place] [id number] No Use as directed [name], MD Taking Active Care Giver  Vitamin D, Ergocalciferol, (DRISDOL) 1.25 MG (50000 UNIT) CAPS capsule [id number] No Take 1 capsule (50,000 Units total) by mouth every 7 (seven) days.  Patient not taking: No sig reported   [name], [name], PA-C Not Taking Active Care Giver           Med Note [name], [name]   Tue Feb 07, 2021  1:57 AM) Never filled          Patient Active Problem List   Diagnosis Date Noted  . Status post below-knee amputation ([place]) 02/17/2021  . Wound dehiscence   . Gangrene of toe of left foot ([place])   . Cutaneous abscess of left foot   . Left foot infection 02/07/2021  . Leukocytosis 02/07/2021  . Thrombocytosis 02/07/2021  . Hyponatremia 02/07/2021  . AKI (acute kidney injury) ([place]) 02/07/2021  . Hyperglycemia due to diabetes mellitus ([place]) 02/07/2021  . Hypoalbuminemia due to protein-calorie malnutrition ([place]) 02/07/2021  . Osteomyelitis of second toe of left foot ([place])   . New onset of congestive heart failure ([place]) 12/26/2020  . CKD (chronic kidney disease) stage 3, GFR 30-59 ml/min (HCC) 12/26/2020  . Acute systolic CHF (congestive heart failure) ([place]) 12/26/2020  . Anemia, chronic disease 06/22/2020  . Amputee, great toe, right ([place]) 06/20/2020  . Normocytic anemia 06/20/2020  . Erectile dysfunction associated with type 2 diabetes mellitus ([place]) 06/20/2020  . Positive for  macroalbuminuria 10/24/2019  . Amputation of left great toe ([place]) 10/23/2019  . Tobacco dependence 10/23/2019  . Osteomyelitis of great toe of right foot ([place]) 06/04/2019  . Diabetic foot infection ([place])   . Noncompliance   . Glaucoma   . Hyperlipidemia   . Essential hypertension 11/06/2003  . Diabetes mellitus ([place]) 11/05/1997    Conditions to be addressed/monitored per PCP order:  rent and utility assistance  There are no care plans that you recently modified to display for this patient.   Follow up:  Patient agrees to Care Plan and Follow-up.  Plan: The Managed Medicaid care management team will reach out to the patient again over the next 14 days.  Date/time of next scheduled Social Work care management/care coordination outreach:  03/31/21  Mickel Fuchs, [place], [place]  High Risk Managed Medicaid Team  [phone]

## 2021-03-15 NOTE — Telephone Encounter (Signed)
CSW assisted pt in completed [place], broadcasting/film/video for [employer].  CSW explained that this housing program does have a waitlist so unsure when this could lead to housing for pt- pt expressed understanding.  CSW encouraged pt again to start pursuing income based housing sites- CSW will provide paramedic with list of housing options to provide to pt during home visit tomorrow.  Will continue to follow and assist as needed  Burna Sis, LCSW Clinical Social Worker [facility] Desk#: [phone] Cell#: [phone]

## 2021-03-15 NOTE — Patient Instructions (Signed)
Visit Information  Scott Greer was given information about Medicaid Managed Care team care coordination services as a part of their [facility] Medicaid benefit. Scott Greer verbally consented to engagement with the [facility] Managed Care team.   For questions related to your [facility] health plan, please call: [phone]  If you would like to schedule transportation through your [facility] plan, please call the following number at least 2 days in advance of your appointment: [phone].  Call the Behavioral Health Crisis Line at [phone], at any time, 24 hours a day, 7 days a week. If you are in danger or need immediate medical attention call 911.  Scott Greer - following are the goals we discussed in your visit today:  Goals Addressed   None      Social Worker will follow up in 14 days.   Scott Greer, BSW, [place] [facility]  [place]  High Risk Managed Medicaid Team  [phone]  Following is a copy of your plan of care:

## 2021-03-16 ENCOUNTER — Encounter (HOSPITAL_COMMUNITY): Payer: Self-pay

## 2021-03-16 ENCOUNTER — Telehealth (HOSPITAL_COMMUNITY): Payer: Self-pay

## 2021-03-16 DIAGNOSIS — E1152 Type 2 diabetes mellitus with diabetic peripheral angiopathy with gangrene: Secondary | ICD-10-CM | POA: Diagnosis not present

## 2021-03-16 NOTE — Progress Notes (Incomplete)
Advanced [facility] Failure Clinic Note   Referring Physician: PCP: [name], MD PCP-Cardiologist: Mertie Moores, MD   HPI:   Mr Duchemin is a 65 year old with a history of HTN, hyperlipidemia, diabetes,Bilateral greattoe amputation due to osteo, ETOH 1 pint a week, smoker, and HFrEF.   Presented to [facility] ED on 12/26/20 with lower extremity edema and weakness. Echoshowed biventricular HF (LVEF 25-30% + severely reduced RV). Diuresedwith IV lasix, and started onEntresto,Jardiance, and spiro.Hadhigh PVC burden so Toprol XL increased to 100 mg daily. Had cath (2/22):with elevated filling pressures, low output HF, and min CAD. Diuresed 20 lbs, started on GDMT, discharge weight 199 lbs.  He had follow up in the HF clinic on 01/12/21. Entresto and torsemide both increased. Referred to HF paramedicine. HFSW actively following.   01/25/21 underwent 2nd toe amputation due to gangrene/osteo. Discharged the same day.   On 01/30/21 HF Paramedic evaluated him at his home. He had not started antibiotics even though it was in the home. Medications set up but he was not taking as directed. Continue to drink alcohol beer and liquor.   Readmitted with left foot infection. Missed orthopedic follow appointments. Left foot xray with gas. Placed on vanc and cefepime. . Ortho consulted and he underwent Transtibial amputation.     Review of Systems: [y] = yes, _0  = no   General: Weight gain _1 ; Weight loss _2 ; Anorexia _3 ; Fatigue _4 ; Fever _5 ; Chills _6 ; Weakness _7   Cardiac: Chest pain/pressure _8 ; Resting SOB _9 ; Exertional SOB _10 ; Orthopnea _11 ; Pedal Edema _12 ; Palpitations _13 ; Syncope _14 ; Presyncope _15 ; Paroxysmal nocturnal dyspnea_16   Pulmonary: Cough _17 ; Wheezing_18 ; Hemoptysis_19 ; Sputum _20 ; Snoring _21   GI: Vomiting_22 ; Dysphagia_23 ; Melena_24 ; Hematochezia _25 ; Heartburn_26 ; Abdominal pain _27 ; Constipation _28 ; Diarrhea _29 ; BRBPR _30   GU: Hematuria_31 ; Dysuria _32 ;  Nocturia_33   Vascular: Pain in legs with walking _34 ; Pain in feet with lying flat _35 ; Non-healing sores _36 ; Stroke _37 ; TIA _38 ; Slurred speech _39 ;  Neuro: Headaches_40 ; Vertigo_41 ; Seizures_42 ; Paresthesias_43 ;Blurred vision _44 ; Diplopia _45 ; Vision changes _46   Ortho/Skin: Arthritis _47 ; Joint pain _48 ; Muscle pain _49 ; Joint swelling _50 ; Back Pain _51 ; Rash _52   Psych: Depression_53 ; Anxiety_54   Heme: Bleeding problems _55 ; Clotting disorders _56 ; Anemia _57   Endocrine: Diabetes _58 ; Thyroid dysfunction_59    Past Medical History:  Diagnosis Date  . Dehiscence of amputation stump (HCC)    left great toe  . Diabetes mellitus without complication ([place]) 2909   Type II  . Glaucoma 2015  . Hyperlipidemia   . Hypertension 2005  . Left foot infection 02/07/2021  . Osteomyelitis ([place])   . Wears glasses     Current Outpatient Medications  Medication Sig Dispense Refill  . amiodarone (PACERONE) 200 MG tablet Take 1 tablet (200 mg total) by mouth daily. 30 tablet 6  . aspirin 81 MG chewable tablet Chew 1 tablet (81 mg total) by mouth daily. 30 tablet 6  . atorvastatin (LIPITOR) 10 MG tablet Take 1 tablet (10 mg total) by mouth daily. 30 tablet 6  . Blood Glucose Monitoring Suppl (TRUE METRIX METER) w/Device KIT Use as directed 1 kit 0  . carvedilol (COREG) 3.125 MG tablet Take  1 tablet (3.125 mg total) by mouth 2 (two) times daily with a meal. 60 tablet 2  . digoxin (LANOXIN) 0.125 MG tablet Take 1 tablet (0.125 mg total) by mouth daily. 30 tablet 6  . empagliflozin (JARDIANCE) 10 MG TABS tablet Take 1 tablet (10 mg total) by mouth daily. 30 tablet 6  . gabapentin (NEURONTIN) 300 MG capsule Take 1 capsule (300 mg total) by mouth 3 (three) times daily. 90 capsule 0  . glucose blood (TRUE METRIX BLOOD GLUCOSE TEST) test strip Use as instructed 100 each 12  . isosorbide-hydrALAZINE (BIDIL) 20-37.5 MG tablet Take 1 tablet by mouth 3 (three) times daily. 90 tablet 2  . metFORMIN  (GLUCOPHAGE) 500 MG tablet Take 1 tablet (500 mg total) by mouth 2 (two) times daily with a meal. 60 tablet 1  . oxyCODONE-acetaminophen (PERCOCET/ROXICET) 5-325 MG tablet Take 1 tablet by mouth every 4 (four) hours as needed for severe pain. 30 tablet 0  . potassium chloride SA (KLOR-CON) 20 MEQ tablet TAKE 1 TABLET (20 MEQ) BY MOUTH DAILY. 30 tablet 2  . sacubitril-valsartan (ENTRESTO) 24-26 MG Take 1 tablet by mouth 2 (two) times daily. 60 tablet 2  . spironolactone (ALDACTONE) 25 MG tablet TAKE 1 TABLET (25 MG TOTAL) BY MOUTH DAILY. 30 tablet 2  . torsemide (DEMADEX) 20 MG tablet Take 1 tablet (20 mg total) by mouth daily. 30 tablet 2  . TRUEplus Lancets 28G MISC Use as directed 100 each 4  . Vitamin D, Ergocalciferol, (DRISDOL) 1.25 MG (50000 UNIT) CAPS capsule Take 1 capsule (50,000 Units total) by mouth every 7 (seven) days. 4 capsule 2   No current facility-administered medications for this visit.    Allergies  Allergen Reactions  . Bee Venom Hives, Itching and Swelling      Social History   Socioeconomic History  . Marital status: Legally Separated    Spouse name: Nira Conn  . Number of children: 2  . Years of education: Not on file  . Highest education level: Associate degree: occupational, Hotel manager, or vocational program  Occupational History  . Not on file  Tobacco Use  . Smoking status: Current Every Day Smoker    Types: Cigars, Cigarettes  . Smokeless tobacco: Former Systems developer    Types: Chew  . Tobacco comment: 6 daily  Vaping Use  . Vaping Use: Never used  Substance and Sexual Activity  . Alcohol use: Yes    Alcohol/week: 11.0 standard drinks    Types: 3 Cans of beer, 8 Shots of liquor per week    Comment: occasional  . Drug use: Yes    Types: Marijuana    Comment: ocassional - last time 05/08/2020  . Sexual activity: Not Currently  Other Topics Concern  . Not on file  Social History Narrative   Out of prison for 2 months.   Lives at home with his wife.    Social Determinants of Health   Financial Resource Strain: High Risk  . Difficulty of Paying Living Expenses: Very hard  Food Insecurity: Food Insecurity Present  . Worried About Charity fundraiser in the Last Year: Sometimes true  . Ran Out of Food in the Last Year: Sometimes true  Transportation Needs: Unmet Transportation Needs  . Lack of Transportation (Medical): Yes  . Lack of Transportation (Non-Medical): Yes  Physical Activity: Inactive  . Days of Exercise per Week: 0 days  . Minutes of Exercise per Session: 0 min  Stress: Not on file  Social Connections: Not on  file  Intimate Partner Violence: Not on file      Family History  Problem Relation Age of Onset  . Stroke Mother   . Diabetes Mother        Toward end of life    There were no vitals filed for this visit.   PHYSICAL EXAM: General:  Well appearing. No respiratory difficulty HEENT: normal Neck: supple. no JVD. Carotids 2+ bilat; no bruits. No lymphadenopathy or thyromegaly appreciated. Cor: PMI nondisplaced. Regular rate & rhythm. No rubs, gallops or murmurs. Lungs: clear Abdomen: soft, nontender, nondistended. No hepatosplenomegaly. No bruits or masses. Good bowel sounds. Extremities: no cyanosis, clubbing, rash, edema Neuro: alert & oriented x 3, cranial nerves grossly intact. moves all 4 extremities w/o difficulty. Affect pleasant.  ECG:   ASSESSMENT & PLAN:  1. Chronic biventricular HFrEF -ECHO 12/2020 EF 25-30% RV severely reduced.CMRILV EF 23%, RVEF 22%, nonspecific RV insertion LGE.LHC with 30% LAD. NICM. Possible ETOH +/- PVC - Volume statusstable. Hold off on diuretics.  - Continue Entresto 24-26 mg bid -Continue  jardiance 10 mg daily.  - Continue hydralazine 75 mg three times a day and continue Imdur 30 mg daily  - Continue  digoxin 0.125 mg daily.  Check dig level  - Check BMP   2. H/O Lower Extremity Osteomyelitis  - S/p Transtibial amputation 4/22  3. Stage III CKD  -  BMP today   4. H/O PVCs - Continue  amio 200 mg daily for suppression - Check TSH and HFTs today. Needs annual eye exams   5. ETOH - Discussed cessation.   6. H/o Poor Compliance - c/w paramedicine to help w/ med compliance   Lyda Jester, PA-C 03/16/21

## 2021-03-16 NOTE — Telephone Encounter (Signed)
Received a call from Mr. Bonneau who reports he did not come to appointment this morning because he over slept after having a fall last night. Mr. Dunivan complained of right hip pain but stated he doesn't think it's broken. He said it feels sore but doesn't want to seek further medical attention. I advised him if he feels the need to seek further care to call EMS or go to Urgent Care. Zi reports he has plenty of pills left in his pill box because he missed several days of his medicines. I advised Mr. Botz the importance of taking his medications. He verbalized understanding. I will plan to see Mr. Delage next week. Call complete.

## 2021-03-17 DIAGNOSIS — E1152 Type 2 diabetes mellitus with diabetic peripheral angiopathy with gangrene: Secondary | ICD-10-CM | POA: Diagnosis not present

## 2021-03-17 DIAGNOSIS — T8130XA Disruption of wound, unspecified, initial encounter: Secondary | ICD-10-CM | POA: Diagnosis not present

## 2021-03-17 DIAGNOSIS — L089 Local infection of the skin and subcutaneous tissue, unspecified: Secondary | ICD-10-CM | POA: Diagnosis not present

## 2021-03-17 DIAGNOSIS — L02612 Cutaneous abscess of left foot: Secondary | ICD-10-CM | POA: Diagnosis not present

## 2021-03-17 DIAGNOSIS — I96 Gangrene, not elsewhere classified: Secondary | ICD-10-CM | POA: Diagnosis not present

## 2021-03-18 DIAGNOSIS — E1152 Type 2 diabetes mellitus with diabetic peripheral angiopathy with gangrene: Secondary | ICD-10-CM | POA: Diagnosis not present

## 2021-03-20 ENCOUNTER — Other Ambulatory Visit: Payer: Self-pay | Admitting: *Deleted

## 2021-03-20 NOTE — Patient Outreach (Signed)
Care Coordination  03/20/2021  Scott Greer 04-09-1956 [id number]    Medicaid Managed Care   Unsuccessful Outreach Note  03/20/2021 Name: Scott Greer MRN: [id number] DOB: 1956/02/10  Referred by: [name], MD Reason for referral : High Risk Managed Medicaid (Unsuccessful RNCM follow up outreach)   An unsuccessful telephone outreach was attempted today. The patient was referred to the case management team for assistance with care management and care coordination.   Follow Up Plan: A HIPAA compliant phone message was left for the patient providing contact information and requesting a return call.  The care management team will reach out to the patient again over the next 7-14 days.   Estanislado Emms RN, BSN [place]  [facility]

## 2021-03-20 NOTE — Patient Instructions (Signed)
Visit Information  [name]  - as a part of your Medicaid benefit, you are eligible for care management and care coordination services at no cost or copay. I was unable to reach you by phone today but would be happy to help you with your health related needs. Please feel free to call me @ [phone].   A member of the Managed Medicaid care management team will reach out to you again over the next 7-14 days.   Varvara Legault RN, BSN [place]  [facility] RN Care Coordinator     

## 2021-03-21 ENCOUNTER — Other Ambulatory Visit: Payer: Self-pay

## 2021-03-21 ENCOUNTER — Ambulatory Visit (INDEPENDENT_AMBULATORY_CARE_PROVIDER_SITE_OTHER): Payer: Medicaid Other | Admitting: Physician Assistant

## 2021-03-21 ENCOUNTER — Telehealth (HOSPITAL_COMMUNITY): Payer: Self-pay

## 2021-03-21 ENCOUNTER — Encounter: Payer: Self-pay | Admitting: Physician Assistant

## 2021-03-21 ENCOUNTER — Other Ambulatory Visit: Payer: Self-pay | Admitting: *Deleted

## 2021-03-21 DIAGNOSIS — E1152 Type 2 diabetes mellitus with diabetic peripheral angiopathy with gangrene: Secondary | ICD-10-CM | POA: Diagnosis not present

## 2021-03-21 DIAGNOSIS — Z89512 Acquired absence of left leg below knee: Secondary | ICD-10-CM

## 2021-03-21 MED ORDER — OXYCODONE-ACETAMINOPHEN 5-325 MG PO TABS: 1.0000 | ORAL_TABLET | ORAL | 0 refills | Status: DC | PRN

## 2021-03-21 NOTE — Patient Instructions (Signed)
Visit Information  Mr. Cuaresma was given information about Medicaid Managed Care team care coordination services as a part of their [facility] Medicaid benefit. [name] verbally consented to engagement with the [facility] Managed Care team.   For questions related to your [facility] health plan, please call: [phone]  If you would like to schedule transportation through your [facility] plan, please call the following number at least 2 days in advance of your appointment: [phone].  Call the Behavioral Health Crisis Line at [phone], at any time, 24 hours a day, 7 days a week. If you are in danger or need immediate medical attention call 911.  Mr. Bacchi - following are the goals we discussed in your visit today:  Goals Addressed   None     Social Worker will follow up with patient.   Gus Puma, BSW, [place] [facility]  [place]  High Risk Managed Medicaid Team  [phone]  Following is a copy of your plan of care:

## 2021-03-21 NOTE — Telephone Encounter (Signed)
Spoke to Sloan who agreed on home visit tomorrow at 1130-1200. Call complete.

## 2021-03-21 NOTE — Progress Notes (Signed)
Office Visit Note   Patient: Scott Greer           Date of Birth: 15-Nov-1955           MRN: [id number] Visit Date: 03/21/2021              Requested by: [name], MD [address] [place],  [place] [address] PCP: [name], MD  Chief Complaint  Patient presents with  . 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  Overall he is doing quite well no complaints  Assessment & Plan: Visit Diagnoses: No diagnosis found.  Plan: We will refill his Percocet.  Follow-up in 1 month.  He has been provided a prescription for a prosthetic and supplies.  When he returns we will order physical therapy  Follow-Up Instructions: No follow-ups on file.   Ortho Exam  Patient is alert, oriented, no adenopathy, well-dressed, normal affect, normal respiratory effort. Left below-knee amputation stump is completely healed.  He has good extension and flexion.  No cellulitis swelling overall is well controlled no signs of infection  Imaging: No results found. No images are attached to the encounter.  Labs: Lab Results  Component Value Date   HGBA1C 9.1 (H) 02/08/2021   HGBA1C 8.6 (H) 02/07/2021   HGBA1C 8.4 (A) 12/14/2020   ESRSEDRATE 78 (H) 06/04/2019   CRP 4.5 (H) 06/04/2019   REPTSTATUS 02/11/2021 FINAL 02/06/2021   REPTSTATUS 02/11/2021 FINAL 02/06/2021   CULT  02/06/2021    NO GROWTH 5 DAYS Performed at [facility], [address], [place], [place] [address]    CULT  02/06/2021    NO GROWTH 5 DAYS Performed at [facility], [address] [place], [place] [address]      Lab Results  Component Value Date   ALBUMIN 2.3 (L) 02/07/2021   ALBUMIN 2.5 (L) 02/06/2021   ALBUMIN 3.1 (L) 12/25/2020   PREALBUMIN 13.8 (L) 06/04/2019    Lab Results  Component Value Date   MG 1.8 02/07/2021   MG 2.0 01/05/2021   MG 2.1 01/04/2021   Lab Results  Component Value Date   VD25OH 9.2 (L) 12/14/2020     Lab Results  Component Value Date   PREALBUMIN 13.8 (L) 06/04/2019   CBC EXTENDED Latest Ref Rng & Units 02/15/2021 02/14/2021 02/13/2021  WBC 4.0 - 10.5 K/uL 10.7(H) 10.3 13.4(H)  RBC 4.22 - 5.81 MIL/uL 3.21(L) 3.30(L) 3.27(L)  HGB 13.0 - 17.0 g/dL 8.5(I) 8.9(L) 8.8(L)  HCT 39.0 - 52.0 % 26.9(L) 27.6(L) 27.3(L)  PLT 150 - 400 K/uL 488(H) 485(H) 480(H)  NEUTROABS 1.7 - 7.7 K/uL - - -  LYMPHSABS 0.7 - 4.0 K/uL - - -     There is no height or weight on file to calculate BMI.  Orders:  No orders of the defined types were placed in this encounter.  Meds ordered this encounter  Medications  . oxyCODONE-acetaminophen (PERCOCET/ROXICET) 5-325 MG tablet    Sig: Take 1 tablet by mouth every 4 (four) hours as needed for severe pain.    Dispense:  30 tablet    Refill:  0     Procedures: No procedures performed  Clinical Data: No additional findings.  ROS:  All other systems negative, except as noted in the HPI. Review of Systems  Objective: Vital Signs: There were no vitals taken for this visit.  Specialty Comments:  No specialty comments  available.  PMFS History: Patient Active Problem List   Diagnosis Date Noted  . Status post below-knee amputation (HCC) 02/17/2021  . Wound dehiscence   . Gangrene of toe of left foot (HCC)   . Cutaneous abscess of left foot   . Left foot infection 02/07/2021  . Leukocytosis 02/07/2021  . Thrombocytosis 02/07/2021  . Hyponatremia 02/07/2021  . AKI (acute kidney injury) (HCC) 02/07/2021  . Hyperglycemia due to diabetes mellitus (HCC) 02/07/2021  . Hypoalbuminemia due to protein-calorie malnutrition (HCC) 02/07/2021  . Osteomyelitis of second toe of left foot (HCC)   . New onset of congestive heart failure (HCC) 12/26/2020  . CKD (chronic kidney disease) stage 3, GFR 30-59 ml/min (HCC) 12/26/2020  . Acute systolic CHF (congestive heart failure) (HCC) 12/26/2020  . Anemia, chronic disease 06/22/2020  . Amputee, great toe, right  (HCC) 06/20/2020  . Normocytic anemia 06/20/2020  . Erectile dysfunction associated with type 2 diabetes mellitus (HCC) 06/20/2020  . Positive for macroalbuminuria 10/24/2019  . Amputation of left great toe (HCC) 10/23/2019  . Tobacco dependence 10/23/2019  . Osteomyelitis of great toe of right foot (HCC) 06/04/2019  . Diabetic foot infection (HCC)   . Noncompliance   . Glaucoma   . Hyperlipidemia   . Essential hypertension 11/06/2003  . Diabetes mellitus (HCC) 11/05/1997   Past Medical History:  Diagnosis Date  . Dehiscence of amputation stump (HCC)    left great toe  . Diabetes mellitus without complication (HCC) 1999   Type II  . Glaucoma 2015  . Hyperlipidemia   . Hypertension 2005  . Left foot infection 02/07/2021  . Osteomyelitis (HCC)   . Wears glasses     Family History  Problem Relation Age of Onset  . Stroke Mother   . Diabetes Mother        Toward end of life    Past Surgical History:  Procedure Laterality Date  . AMPUTATION Left 06/05/2019   Procedure: LEFT GREAT TOE AMPUTATION;  Surgeon: [name], MD;  Location: [facility] OR;  Service: Orthopedics;  Laterality: Left;  . AMPUTATION Left 07/10/2019   Procedure: LEFT FOOT 1ST RAY AMPUTATION;  Surgeon: [name], MD;  Location: [facility] OR;  Service: Orthopedics;  Laterality: Left;  . AMPUTATION Right 05/25/2020   Procedure: RIGHT GREAT TOE AMPUTATION;  Surgeon: [name], MD;  Location: [facility] OR;  Service: Orthopedics;  Laterality: Right;  . AMPUTATION Left 01/25/2021   Procedure: LEFT 2ND TOE AMPUTATION;  Surgeon: [name], MD;  Location: [facility] OR;  Service: Orthopedics;  Laterality: Left;  . AMPUTATION Left 02/08/2021   Procedure: LEFT TRANSMETATARSAL AMPUTATION;  Surgeon: [name], MD;  Location: [facility] OR;  Service: Orthopedics;  Laterality: Left;  . AMPUTATION Left 02/10/2021   Procedure: AMPUTATION BELOW KNEE;  Surgeon: [name], MD;  Location: [facility] OR;  Service: Orthopedics;  Laterality: Left;  . NO  PAST SURGERIES    . RIGHT/LEFT HEART CATH AND CORONARY ANGIOGRAPHY N/A 01/02/2021   Procedure: RIGHT/LEFT HEART CATH AND CORONARY ANGIOGRAPHY;  Surgeon: Runell Gess, MD;  Location: [facility] INVASIVE [facility] LAB;  Service: Cardiovascular;  Laterality: N/A;   Social History   Occupational History  . Not on file  Tobacco Use  . Smoking status: Current Every Day Smoker    Types: Cigars, Cigarettes  . Smokeless tobacco: Former Neurosurgeon    Types: Chew  . Tobacco comment: 6 daily  Vaping Use  . Vaping Use: Never used  Substance and Sexual Activity  .  Alcohol use: Yes    Alcohol/week: 11.0 standard drinks    Types: 3 Cans of beer, 8 Shots of liquor per week    Comment: occasional  . Drug use: Yes    Types: Marijuana    Comment: ocassional - last time 05/08/2020  . Sexual activity: Not Currently

## 2021-03-21 NOTE — Patient Outreach (Signed)
Medicaid Managed Care Social Work Note  03/21/2021 Name:  Scott Greer MRN:  [id number] DOB:  [birth year]  Scott Greer is an 65 y.o. year old male who is a primary patient of Scott Pier, MD.  The Medicaid Managed Care Coordination team was consulted for assistance with:  Housing assistance  [name] was given information about Medicaid Managed CareCoordination services today. Scott Greer agreed to services and verbal consent obtained.  Engaged with patient  for by telephone forfollow up visit in response to referral for case management and/or care coordination services.   Assessments/Interventions:  Review of past medical history, allergies, medications, health status, including review of consultants reports, laboratory and other test data, was performed as part of comprehensive evaluation and provision of chronic care management services.  SDOH: (Social Determinant of Health) assessments and interventions performed:  BSW received a telephone call from patient stating he thinks he is going to be put out. BSW asked if he has received a letter stating that, patient stated no he has not. BSW contacted Scott Greer and rep stated they are taking applications, but they are behind about 14 business days, rep stated applications do have to be completed and submitted online. BSW completed and submited application on behalf of patient.     Advanced Directives Status:  Not addressed in this encounter.  Care Plan                 Allergies  Allergen Reactions  . Bee Venom Hives, Itching and Swelling    Medications Reviewed Today    Reviewed by Persons, Scott Palmer, PA (Physician Assistant) on 02/28/21 at 1525  Med List Status: <None>  Medication Order Taking? Sig Documenting Provider Last Dose Status Informant  amiodarone (PACERONE) 200 MG tablet [id number]  Take 1 tablet (200 mg total) by mouth daily. Scott Dresser, MD  Active   aspirin 81 MG chewable tablet [id number]   Chew 1 tablet (81 mg total) by mouth daily. Scott Dresser, MD  Active   atorvastatin (LIPITOR) 10 MG tablet [id number]  Take 1 tablet (10 mg total) by mouth daily. Scott Dresser, MD  Active   Blood Glucose Monitoring Suppl (TRUE METRIX METER) w/Device Scott Greer [id number] No Use as directed Scott Pier, MD Taking Active Care Giver  carvedilol (COREG) 3.125 MG tablet [id number]  Take 1 tablet (3.125 mg total) by mouth 2 (two) times daily with a meal. Scott Dresser, MD  Active   digoxin (LANOXIN) 0.125 MG tablet [id number]  Take 1 tablet (0.125 mg total) by mouth daily. Scott Dresser, MD  Active   empagliflozin (JARDIANCE) 10 MG TABS tablet [id number]  Take 1 tablet (10 mg total) by mouth daily. Scott Dresser, MD  Active   gabapentin (NEURONTIN) 300 MG capsule [id number] No Take 1 capsule (300 mg total) by mouth 3 (three) times daily. Scott Aloe, MD Taking Active   glucose blood (TRUE METRIX BLOOD GLUCOSE TEST) test strip [id number] No Use as instructed Scott Pier, MD Taking Active Care Giver  isosorbide-hydrALAZINE (BIDIL) 20-37.5 MG tablet [id number]  Take 1 tablet by mouth 3 (three) times daily. Scott Dresser, MD  Active   metFORMIN (GLUCOPHAGE) 500 MG tablet [id number] No Take 1 tablet (500 mg total) by mouth 2 (two) times daily with a meal. [name], [name], PA-C Taking Active Care Giver           Med Note Scott Greer, Scott Greer   Tue Feb 07, 2021  1:57 AM) LF 12/14/20 #60  oxyCODONE-acetaminophen (PERCOCET) 10-325 MG tablet [id number]  Take 1 tablet by mouth every 4 (four) hours as needed for pain. Scott Greer, [name]  Active   potassium chloride SA (KLOR-CON) 20 MEQ tablet [id number]  TAKE 1 TABLET (20 MEQ) BY MOUTH DAILY. Scott Dresser, MD  Active   sacubitril-valsartan [facility]) 24-26 [place] [id number]  Take 1 tablet by mouth 2 (two) times daily. Scott Dresser, MD  Active   spironolactone (ALDACTONE) 25 MG tablet [id number]  TAKE 1 TABLET (25 MG TOTAL) BY MOUTH DAILY. Scott Dresser, MD  Active   torsemide (DEMADEX) 20 MG tablet [id number]  Take 1 tablet (20 mg total) by mouth daily. Scott Dresser, MD  Active   TRUEplus Lancets 28G [place] [id number] No Use as directed Scott Pier, MD Taking Active Care Giver  Vitamin Greer, Ergocalciferol, (DRISDOL) 1.25 MG (50000 UNIT) CAPS capsule [id number] No Take 1 capsule (50,000 Units total) by mouth every 7 (seven) days.  Patient not taking: No sig reported   [name], [name], PA-C Not Taking Active Care Giver           Med Note Scott Greer, Scott Greer   Tue Feb 07, 2021  1:57 AM) Never filled          Patient Active Problem List   Diagnosis Date Noted  . Status post below-knee amputation ([place]) 02/17/2021  . Wound dehiscence   . Gangrene of toe of left foot ([place])   . Cutaneous abscess of left foot   . Left foot infection 02/07/2021  . Leukocytosis 02/07/2021  . Thrombocytosis 02/07/2021  . Hyponatremia 02/07/2021  . AKI (acute kidney injury) ([place]) 02/07/2021  . Hyperglycemia due to diabetes mellitus ([place]) 02/07/2021  . Hypoalbuminemia due to protein-calorie malnutrition ([place]) 02/07/2021  . Osteomyelitis of second toe of left foot ([place])   . New onset of congestive heart failure ([place]) 12/26/2020  . CKD (chronic kidney disease) stage 3, GFR 30-59 ml/min (HCC) 12/26/2020  . Acute systolic CHF (congestive heart failure) ([place]) 12/26/2020  . Anemia, chronic disease 06/22/2020  . Amputee, great toe, right ([place]) 06/20/2020  . Normocytic anemia 06/20/2020  . Erectile dysfunction associated with type 2 diabetes mellitus ([place]) 06/20/2020  . Positive for macroalbuminuria 10/24/2019  . Amputation of left great toe ([place]) 10/23/2019  . Tobacco dependence 10/23/2019  . Osteomyelitis of great toe of right foot ([place]) 06/04/2019  . Diabetic foot infection ([place])   . Noncompliance   . Glaucoma   . Hyperlipidemia   . Essential hypertension 11/06/2003  . Diabetes mellitus ([place]) 11/05/1997    Conditions to be addressed/monitored per  PCP order:  housing assistance  There are no care plans that you recently modified to display for this patient.   Follow up:  Patient agrees to Care Plan and Follow-up.  Plan: The Managed Medicaid care management team will reach out to the patient again over the next 14 days.   Mickel Fuchs, BSW, [place] Managed Medicaid Team  [phone]

## 2021-03-21 NOTE — Patient Instructions (Signed)
Visit Information  Scott Greer was given information about Medicaid Managed Care team care coordination services as a part of their [facility] Medicaid benefit. Scott Greer verbally consented to engagement with the [facility] Managed Care team.   For questions related to your [facility] health plan, please call: [phone]  If you would like to schedule transportation through your [facility] plan, please call the following number at least 2 days in advance of your appointment: [phone].  Call the Behavioral Health Crisis Line at [phone], at any time, 24 hours a day, 7 days a week. If you are in danger or need immediate medical attention call 911.  Scott Greer - following are the goals we discussed in your visit today:  Goals Addressed            This Visit's Progress   . Find Help in My Community       Timeframe:  Long-Range Goal Priority:  High Start Date:   02/17/21                          Expected End Date: 05/19/21                    Follow Up Date 04/20/21   - call for [facility] for free phone benefit [phone] - begin a notebook of services in my neighborhood or community - call 211 when I need some help - follow-up on any referrals for help I am given - make a list of family or friends that I can call    Why is this important?    Knowing how and where to find help for yourself or family in your neighborhood and community is an important skill.   You will want to take some steps to learn how.        . Make and Keep All Appointments       Timeframe:  Long-Range Goal Priority:  High Start Date:    02/17/21                         Expected End Date:   05/19/21                    Follow Up Date 04/20/21   - arrange a ride through an agency 1 week before appointment, call [phone] - ask family or friend for a ride - keep a calendar with appointment dates    Why is this important?    Part of staying healthy is seeing the doctor for  follow-up care.   If you forget your appointments, there are some things you can do to stay on track.           Please see education materials related to diabetes provided as print materials.     Diabetes Mellitus and Nutrition, Adult When you have diabetes, or diabetes mellitus, it is very important to have healthy eating habits because your blood sugar (glucose) levels are greatly affected by what you eat and drink. Eating healthy foods in the right amounts, at about the same times every day, can help you:  Control your blood glucose.  Lower your risk of heart disease.  Improve your blood pressure.  Reach or maintain a healthy weight. What can affect my meal plan? Every person with diabetes is different, and each person has different needs for a meal plan. Your health care provider may recommend that  you work with a dietitian to make a meal plan that is best for you. Your meal plan may vary depending on factors such as:  The calories you need.  The medicines you take.  Your weight.  Your blood glucose, blood pressure, and cholesterol levels.  Your activity level.  Other health conditions you have, such as heart or kidney disease. How do carbohydrates affect me? Carbohydrates, also called carbs, affect your blood glucose level more than any other type of food. Eating carbs naturally raises the amount of glucose in your blood. Carb counting is a method for keeping track of how many carbs you eat. Counting carbs is important to keep your blood glucose at a healthy level, especially if you use insulin or take certain oral diabetes medicines. It is important to know how many carbs you can safely have in each meal. This is different for every person. Your dietitian can help you calculate how many carbs you should have at each meal and for each snack. How does alcohol affect me? Alcohol can cause a sudden decrease in blood glucose (hypoglycemia), especially if you use insulin or  take certain oral diabetes medicines. Hypoglycemia can be a life-threatening condition. Symptoms of hypoglycemia, such as sleepiness, dizziness, and confusion, are similar to symptoms of having too much alcohol.  Do not drink alcohol if: ? Your health care provider tells you not to drink. ? You are pregnant, may be pregnant, or are planning to become pregnant.  If you drink alcohol: ? Do not drink on an empty stomach. ? Limit how much you use to:  0-1 drink a day for women.  0-2 drinks a day for men. ? Be aware of how much alcohol is in your drink. In the [place] one drink equals one 12 oz bottle of beer (355 mL), one 5 oz glass of wine (148 mL), or one 1 oz glass of hard liquor (44 mL). ? Keep yourself hydrated with water, diet soda, or unsweetened iced tea.  Keep in mind that regular soda, juice, and other mixers may contain a lot of sugar and must be counted as carbs. What are tips for following this plan? Reading food labels  Start by checking the serving size on the "Nutrition Facts" label of packaged foods and drinks. The amount of calories, carbs, fats, and other nutrients listed on the label is based on one serving of the item. Many items contain more than one serving per package.  Check the total grams (g) of carbs in one serving. You can calculate the number of servings of carbs in one serving by dividing the total carbs by 15. For example, if a food has 30 g of total carbs per serving, it would be equal to 2 servings of carbs.  Check the number of grams (g) of saturated fats and trans fats in one serving. Choose foods that have a low amount or none of these fats.  Check the number of milligrams (mg) of salt (sodium) in one serving. Most people should limit total sodium intake to less than 2,300 mg per day.  Always check the nutrition information of foods labeled as "low-fat" or "nonfat." These foods may be higher in added sugar or refined carbs and should be avoided.  Talk to  your dietitian to identify your daily goals for nutrients listed on the label. Shopping  Avoid buying canned, pre-made, or processed foods. These foods tend to be high in fat, sodium, and added sugar.  Shop around the outside  edge of the grocery store. This is where you will most often find fresh fruits and vegetables, bulk grains, fresh meats, and fresh dairy. Cooking  Use low-heat cooking methods, such as baking, instead of high-heat cooking methods like deep frying.  Cook using healthy oils, such as olive, canola, or sunflower oil.  Avoid cooking with butter, cream, or high-fat meats. Meal planning  Eat meals and snacks regularly, preferably at the same times every day. Avoid going long periods of time without eating.  Eat foods that are high in fiber, such as fresh fruits, vegetables, beans, and whole grains. Talk with your dietitian about how many servings of carbs you can eat at each meal.  Eat 4-6 oz (112-168 g) of lean protein each day, such as lean meat, chicken, fish, eggs, or tofu. One ounce (oz) of lean protein is equal to: ? 1 oz (28 g) of meat, chicken, or fish. ? 1 egg. ?  cup (62 g) of tofu.  Eat some foods each day that contain healthy fats, such as avocado, nuts, seeds, and fish.   What foods should I eat? Fruits Berries. Apples. Oranges. Peaches. Apricots. Plums. Grapes. Mango. Papaya. Pomegranate. Kiwi. Cherries. Vegetables Lettuce. Spinach. Leafy greens, including kale, chard, collard greens, and mustard greens. Beets. Cauliflower. Cabbage. Broccoli. Carrots. Green beans. Tomatoes. Peppers. Onions. Cucumbers. Brussels sprouts. Grains Whole grains, such as whole-wheat or whole-grain bread, crackers, tortillas, cereal, and pasta. Unsweetened oatmeal. Quinoa. Brown or wild rice. Meats and other proteins Seafood. Poultry without skin. Lean cuts of poultry and beef. Tofu. Nuts. Seeds. Dairy Low-fat or fat-free dairy products such as milk, yogurt, and cheese. The  items listed above may not be a complete list of foods and beverages you can eat. Contact a dietitian for more information. What foods should I avoid? Fruits Fruits canned with syrup. Vegetables Canned vegetables. Frozen vegetables with butter or cream sauce. Grains Refined white flour and flour products such as bread, pasta, snack foods, and cereals. Avoid all processed foods. Meats and other proteins Fatty cuts of meat. Poultry with skin. Breaded or fried meats. Processed meat. Avoid saturated fats. Dairy Full-fat yogurt, cheese, or milk. Beverages Sweetened drinks, such as soda or iced tea. The items listed above may not be a complete list of foods and beverages you should avoid. Contact a dietitian for more information. Questions to ask a health care provider  Do I need to meet with a diabetes educator?  Do I need to meet with a dietitian?  What number can I call if I have questions?  When are the best times to check my blood glucose? Where to find more information:  American Diabetes Association: diabetes.org  Academy of Nutrition and Dietetics: www.eatright.[employer]  National Institute of Diabetes and Digestive and Kidney Diseases: CarFlippers.tnwww.niddk.nih.gov  Association of Diabetes Care and Education Specialists: www.diabeteseducator.org Summary  It is important to have healthy eating habits because your blood sugar (glucose) levels are greatly affected by what you eat and drink.  A healthy meal plan will help you control your blood glucose and maintain a healthy lifestyle.  Your health care provider may recommend that you work with a dietitian to make a meal plan that is best for you.  Keep in mind that carbohydrates (carbs) and alcohol have immediate effects on your blood glucose levels. It is important to count carbs and to use alcohol carefully. This information is not intended to replace advice given to you by your health care provider. Make sure you discuss any questions  you  have with your health care provider. Document Revised: 09/29/2019 Document Reviewed: 09/29/2019 Elsevier Patient Education  2021 [employer].   The patient verbalized understanding of instructions provided today and agreed to receive a mailed copy of patient instruction and/or educational materials.  Telephone follow up appointment with Managed Medicaid care management team member scheduled for:04/20/21 @ 10:30am  Estanislado Emms RN, BSN [place]  [facility] RN Care Coordinator   Following is a copy of your plan of care:  Patient Care Plan: General Plan of Care (Adult)    Problem Identified: Health Promotion or Disease Self-Management (General Plan of Care)     Long-Range Goal: Self-Management Plan Developed   Start Date: 02/17/2021  Expected End Date: 05/19/2021  Recent Progress: On track  Priority: High  Note:   Current Barriers:   Ineffective Self Health Maintenance-Scott Greer 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 He has a rolling walker and manual wheelchair, he will begin having meal delivery today for 10 days, he was approved for 14 hours/wk for PCS, [name], EMT is managing/preparing his medications, he is waiting on PT to be scheduled. He is concerned about his finanaces. He will no longer be able to work and is unsure how he can manage all of his bills.-Update-Scott Greer has not started PT at home. He is concerned about possible eviction and how to manage his bills. Scott Greer is receiving PCS at this time. He is working with Harrold Donath for medication management and will have his medicines delivered and packaged to assist with compliance.  Unable to independently manage health care related to recent BKA  Does not attend all scheduled provider appointments  Unable to perform ADLs independently  Unable to perform IADLs independently  Does not contact provider office for questions/concerns  Financial insecurity  Currently UNABLE TO  independently self manage needs related to chronic health conditions.   Knowledge Deficits related to short term plan for care coordination needs and long term plans for chronic disease management needs Nurse Case Manager Clinical Goal(s):   patient will work with care management team to address care coordination and chronic disease management needs related to recent BKA   Interventions:   Evaluation of current treatment plan related to BKA and patient's adherence to plan as established by provider.  Discussed plans with patient for ongoing care management follow up and provided patient with direct contact information for care management team  Provided patient with printed educational materials related to diabetic diet  Discussed the importance of compliance with diabetic diet and keeping BS at normal levels for good healing  Provided therapeutic listening  Collaborated with [facility] regarding home PT services-RNCM is waiting for a return call from representative-will reach out to [facility] to follow up  Encouraged patient to call today and follow up on resources provided by LCSW and BSW  Reviewed upcoming scheduled appointments including 5/17 for wound check and 03/30/21 with PCP and discussed the importance of keeping all appointments  Provided the number for [facility] for free phone benefit [phone], contact number for free medical transportation [phone] Self Care Activities:  . Patient will self administer medications as prescribed . Patient will attend all scheduled provider appointments . Patient will call provider office for new concerns or questions . Patient will work with BSW to address care coordination needs and will continue to work with the clinical team to address health care and disease management related needs.   . Patient will work with MM Pharmacist,  Harrold Donath for medication management Patient Goals: -call for [facility] for free phone benefit [phone] -  begin a notebook of services in my neighborhood or community - call 211 when I need some help - follow-up on any referrals for help I am given - make a list of family or friends that I can call  - arrange a ride through an agency 1 week before appointment, call [phone] - ask family or friend for a ride - keep a calendar with appointment dates  Follow Up Plan: Telephone follow up appointment with care management team member scheduled for:04/20/21 @ 10:30am    Patient Care Plan: Medication Management    Problem Identified: Health Promotion or Disease Self-Management (General Plan of Care)     Goal: Medication Management   Note:   Current Barriers:  . Unable to independently monitor therapeutic efficacy . Unable to self administer medications as prescribed . Does not adhere to prescribed medication regimen . Does not maintain contact with provider office . Does not contact provider office for questions/concerns .   Pharmacist Clinical Goal(s):  Marland Kitchen Over the next 30 days, patient will contact provider office for questions/concerns as evidenced notation of same in electronic health record through collaboration with PharmD and provider.  .   Interventions: . Inter-disciplinary care team collaboration (see longitudinal plan of care) . Comprehensive medication review performed; medication list updated in electronic medical record  @RXCPDIABETES @ @RXCPHYPERTENSION @ @RXCPHYPERLIPIDEMIA @  Patient Goals/Self-Care Activities . Over the next 30 days, patient will:  - take medications as prescribed  Follow Up Plan: The care management team will reach out to the patient again over the next 30 days.

## 2021-03-21 NOTE — Patient Outreach (Signed)
Medicaid Managed Care   Nurse Care Manager Note  03/21/2021 Name:  Scott Greer MRN:  [id number] DOB:  1956-03-19  Scott Greer is an 65 y.o. year old male who is a primary patient of Scott Pier, MD.  The [facility] Managed Care Coordination team was consulted for assistance with:    CHF HTN DMII and recent BKA  Scott Greer was given information about Medicaid Managed Care Coordination team services today. Scott Greer agreed to services and verbal consent obtained.  Engaged with patient by telephone for follow up visit in response to provider referral for case management and/or care coordination services.   Assessments/Interventions:  Review of past medical history, allergies, medications, health status, including review of consultants reports, laboratory and other test data, was performed as part of comprehensive evaluation and provision of chronic care management services.  SDOH (Social Determinants of Health) assessments and interventions performed:   Care Plan  Allergies  Allergen Reactions  . Bee Venom Hives, Itching and Swelling    Medications Reviewed Today    Reviewed by Persons, Scott Palmer, PA (Physician Assistant) on 02/28/21 at 1525  Med List Status: <None>  Medication Order Taking? Sig Documenting Provider Last Dose Status Informant  amiodarone (PACERONE) 200 MG tablet [id number]  Take 1 tablet (200 mg total) by mouth daily. Scott Dresser, MD  Active   aspirin 81 MG chewable tablet [id number]  Chew 1 tablet (81 mg total) by mouth daily. Scott Dresser, MD  Active   atorvastatin (LIPITOR) 10 MG tablet [id number]  Take 1 tablet (10 mg total) by mouth daily. Scott Dresser, MD  Active   Blood Glucose Monitoring Suppl (TRUE METRIX METER) w/Device Scott Greer [id number] No Use as directed Scott Pier, MD Taking Active Care Giver  carvedilol (COREG) 3.125 MG tablet [id number]  Take 1 tablet (3.125 mg total) by mouth 2 (two) times daily with a meal. Scott Dresser, MD  Active   digoxin (LANOXIN) 0.125 MG tablet [id number]  Take 1 tablet (0.125 mg total) by mouth daily. Scott Dresser, MD  Active   empagliflozin (JARDIANCE) 10 MG TABS tablet [id number]  Take 1 tablet (10 mg total) by mouth daily. Scott Dresser, MD  Active   gabapentin (NEURONTIN) 300 MG capsule [id number] No Take 1 capsule (300 mg total) by mouth 3 (three) times daily. Scott Aloe, MD Taking Active   glucose blood (TRUE METRIX BLOOD GLUCOSE TEST) test strip [id number] No Use as instructed Scott Pier, MD Taking Active Care Giver  isosorbide-hydrALAZINE (BIDIL) 20-37.5 MG tablet [id number]  Take 1 tablet by mouth 3 (three) times daily. Scott Dresser, MD  Active   metFORMIN (GLUCOPHAGE) 500 MG tablet [id number] No Take 1 tablet (500 mg total) by mouth 2 (two) times daily with a meal. [name], [name], PA-C Taking Active Care Giver           Med Note Scott Greer, Scott Greer   Tue Feb 07, 2021  1:57 AM) LF 12/14/20 #60  oxyCODONE-acetaminophen (PERCOCET) 10-325 MG tablet [id number]  Take 1 tablet by mouth every 4 (four) hours as needed for pain. Persons, Scott Greer, [name]  Active   potassium chloride SA (KLOR-CON) 20 MEQ tablet [id number]  TAKE 1 TABLET (20 MEQ) BY MOUTH DAILY. Scott Dresser, MD  Active   sacubitril-valsartan [facility]) 24-26 [place] [id number]  Take 1 tablet by mouth 2 (two) times daily. Scott Dresser, MD  Active   spironolactone (ALDACTONE) 25 MG tablet  [id number]  TAKE 1 TABLET (25 MG TOTAL) BY MOUTH DAILY. Scott Dresser, MD  Active   torsemide (DEMADEX) 20 MG tablet [id number]  Take 1 tablet (20 mg total) by mouth daily. Scott Dresser, MD  Active   TRUEplus Lancets 28G [place] [id number] No Use as directed Scott Pier, MD Taking Active Care Giver  Vitamin Greer, Ergocalciferol, (DRISDOL) 1.25 MG (50000 UNIT) CAPS capsule [id number] No Take 1 capsule (50,000 Units total) by mouth every 7 (seven) days.  Patient not taking: No sig reported   [name], [name], PA-C Not Taking Active  Care Giver           Med Note Scott Greer, Scott Greer   Tue Feb 07, 2021  1:57 AM) Never filled          Patient Active Problem List   Diagnosis Date Noted  . Status post below-knee amputation ([place]) 02/17/2021  . Wound dehiscence   . Gangrene of toe of left foot ([place])   . Cutaneous abscess of left foot   . Left foot infection 02/07/2021  . Leukocytosis 02/07/2021  . Thrombocytosis 02/07/2021  . Hyponatremia 02/07/2021  . AKI (acute kidney injury) ([place]) 02/07/2021  . Hyperglycemia due to diabetes mellitus ([place]) 02/07/2021  . Hypoalbuminemia due to protein-calorie malnutrition ([place]) 02/07/2021  . Osteomyelitis of second toe of left foot ([place])   . New onset of congestive heart failure ([place]) 12/26/2020  . CKD (chronic kidney disease) stage 3, GFR 30-59 ml/min (HCC) 12/26/2020  . Acute systolic CHF (congestive heart failure) ([place]) 12/26/2020  . Anemia, chronic disease 06/22/2020  . Amputee, great toe, right ([place]) 06/20/2020  . Normocytic anemia 06/20/2020  . Erectile dysfunction associated with type 2 diabetes mellitus ([place]) 06/20/2020  . Positive for macroalbuminuria 10/24/2019  . Amputation of left great toe ([place]) 10/23/2019  . Tobacco dependence 10/23/2019  . Osteomyelitis of great toe of right foot ([place]) 06/04/2019  . Diabetic foot infection ([place])   . Noncompliance   . Glaucoma   . Hyperlipidemia   . Essential hypertension 11/06/2003  . Diabetes mellitus ([place]) 11/05/1997    Conditions to be addressed/monitored per PCP order:  CHF, HTN, DMII and recent [place] : General Plan of Care (Adult)  Updates made by Scott Montane, RN since 03/21/2021 12:00 AM    Problem: Health Promotion or Disease Self-Management (General Plan of Care)     Long-Range Goal: Self-Management Plan Developed   Start Date: 02/17/2021  Expected End Date: 05/19/2021  Recent Progress: On track  Priority: High  Note:   Current Barriers:   Ineffective Self Health Maintenance-Scott Greer was discharged  from the hospital on 02/15/21 for left BKA. He has a rolling walker and manual wheelchair, he will begin having meal delivery today for 10 days, he was approved for 14 hours/wk for PCS, [name], EMT is managing/preparing his medications, he is waiting on PT to be scheduled. He is concerned about his finanaces. He will no longer be able to work and is unsure how he can manage all of his bills.-Update-Mr. Mcwhirter has not started PT at home. He is concerned about possible eviction and how to manage his bills. Mr. Schreier is receiving PCS at this time. He is working with [name] for medication management and will have his medicines delivered and packaged to assist with compliance.  Unable to independently manage health care related to recent BKA  Does not attend all scheduled provider appointments  Unable to perform ADLs independently  Unable  to perform IADLs independently  Does not contact provider office for questions/concerns  Financial insecurity  Currently UNABLE TO independently self manage needs related to chronic health conditions.   Knowledge Deficits related to short term plan for care coordination needs and long term plans for chronic disease management needs Nurse Case Manager Clinical Goal(s):   patient will work with care management team to address care coordination and chronic disease management needs related to recent BKA   Interventions:   Evaluation of current treatment plan related to BKA and patient's adherence to plan as established by provider.  Discussed plans with patient for ongoing care management follow up and provided patient with direct contact information for care management team  Provided patient with printed educational materials related to diabetic diet  Discussed the importance of compliance with diabetic diet and keeping BS at normal levels for good healing  Provided therapeutic listening  Collaborated with [facility] regarding home PT services-RNCM is waiting  for a return call from representative-will reach out to [facility] to follow up  Encouraged patient to call today and follow up on resources provided by LCSW and BSW  Reviewed upcoming scheduled appointments including 5/17 for wound check and 03/30/21 with PCP and discussed the importance of keeping all appointments  Provided the number for [facility] for free phone benefit [phone], contact number for free medical transportation [phone] Self Care Activities:  . Patient will self administer medications as prescribed . Patient will attend all scheduled provider appointments . Patient will call provider office for new concerns or questions . Patient will work with BSW to address care coordination needs and will continue to work with the clinical team to address health care and disease management related needs.   . Patient will work with MM Pharmacist, [name] for medication management Patient Goals: -call for [facility] for free phone benefit [phone] - begin a notebook of services in my neighborhood or community - call 211 when I need some help - follow-up on any referrals for help I am given - make a list of family or friends that I can call  - arrange a ride through an agency 1 week before appointment, call [phone] - ask family or friend for a ride - keep a calendar with appointment dates  Follow Up Plan: Telephone follow up appointment with care management team member scheduled for:04/20/21 @ 10:30am      Follow Up:  Patient agrees to Care Plan and Follow-up.  Plan: The Managed Medicaid care management team will reach out to the patient again over the next 30 days.  Date/time of next scheduled RN care management/care coordination outreach: 04/20/21 @ 10:30am  [name] RN, [place] RN Care Coordinator

## 2021-03-22 ENCOUNTER — Telehealth (HOSPITAL_COMMUNITY): Payer: Self-pay | Admitting: Licensed Clinical Social Worker

## 2021-03-22 ENCOUNTER — Other Ambulatory Visit (HOSPITAL_COMMUNITY): Payer: Self-pay

## 2021-03-22 DIAGNOSIS — E1152 Type 2 diabetes mellitus with diabetic peripheral angiopathy with gangrene: Secondary | ICD-10-CM | POA: Diagnosis not present

## 2021-03-22 NOTE — Patient Outreach (Signed)
[name], EMT working with Cardio, called me to let me know Torsemide wasn't included in patient's packs. She added them but will get it added to next month's packaging for sure.

## 2021-03-22 NOTE — Progress Notes (Signed)
Paramedicine Encounter    Patient ID: Scott Greer, male    DOB: April 08, 1956, 65 y.o.   MRN: [id number]   Patient Care Team: [name], MD as PCP - General (Internal Medicine) [name], [name], MD as PCP - General (Cardiology) [name], [name], MD as PCP - Cardiology (Cardiology) [name], RN as Case Manager Lane Hacker, [facility] as Pharmacist (Pharmacist) Ethelda Chick as Social Worker  Patient Active Problem List   Diagnosis Date Noted  . Status post below-knee amputation ([place]) 02/17/2021  . Wound dehiscence   . Gangrene of toe of left foot ([place])   . Cutaneous abscess of left foot   . Left foot infection 02/07/2021  . Leukocytosis 02/07/2021  . Thrombocytosis 02/07/2021  . Hyponatremia 02/07/2021  . AKI (acute kidney injury) ([place]) 02/07/2021  . Hyperglycemia due to diabetes mellitus ([place]) 02/07/2021  . Hypoalbuminemia due to protein-calorie malnutrition ([place]) 02/07/2021  . Osteomyelitis of second toe of left foot ([place])   . New onset of congestive heart failure ([place]) 12/26/2020  . CKD (chronic kidney disease) stage 3, GFR 30-59 ml/min (HCC) 12/26/2020  . Acute systolic CHF (congestive heart failure) ([place]) 12/26/2020  . Anemia, chronic disease 06/22/2020  . Amputee, great toe, right ([place]) 06/20/2020  . Normocytic anemia 06/20/2020  . Erectile dysfunction associated with type 2 diabetes mellitus ([place]) 06/20/2020  . Positive for macroalbuminuria 10/24/2019  . Amputation of left great toe ([place]) 10/23/2019  . Tobacco dependence 10/23/2019  . Osteomyelitis of great toe of right foot ([place]) 06/04/2019  . Diabetic foot infection ([place])   . Noncompliance   . Glaucoma   . Hyperlipidemia   . Essential hypertension 11/06/2003  . Diabetes mellitus ([place]) 11/05/1997    Current Outpatient Medications:  .  amiodarone (PACERONE) 200 MG tablet, Take 1 tablet (200 mg total) by mouth daily., Disp: 30 tablet, Rfl: 6 .  aspirin 81 MG chewable tablet, Chew 1 tablet (81 mg  total) by mouth daily., Disp: 30 tablet, Rfl: 6 .  atorvastatin (LIPITOR) 10 MG tablet, Take 1 tablet (10 mg total) by mouth daily., Disp: 30 tablet, Rfl: 6 .  Blood Glucose Monitoring Suppl (TRUE METRIX METER) w/Device KIT, Use as directed, Disp: 1 kit, Rfl: 0 .  carvedilol (COREG) 3.125 MG tablet, Take 1 tablet (3.125 mg total) by mouth 2 (two) times daily with a meal., Disp: 60 tablet, Rfl: 2 .  digoxin (LANOXIN) 0.125 MG tablet, Take 1 tablet (0.125 mg total) by mouth daily., Disp: 30 tablet, Rfl: 6 .  empagliflozin (JARDIANCE) 10 MG TABS tablet, Take 1 tablet (10 mg total) by mouth daily., Disp: 30 tablet, Rfl: 6 .  gabapentin (NEURONTIN) 300 MG capsule, Take 1 capsule (300 mg total) by mouth 3 (three) times daily., Disp: 90 capsule, Rfl: 0 .  glucose blood (TRUE METRIX BLOOD GLUCOSE TEST) test strip, Use as instructed, Disp: 100 each, Rfl: 12 .  isosorbide-hydrALAZINE (BIDIL) 20-37.5 MG tablet, Take 1 tablet by mouth 3 (three) times daily., Disp: 90 tablet, Rfl: 2 .  metFORMIN (GLUCOPHAGE) 500 MG tablet, Take 1 tablet (500 mg total) by mouth 2 (two) times daily with a meal., Disp: 60 tablet, Rfl: 1 .  oxyCODONE-acetaminophen (PERCOCET/ROXICET) 5-325 MG tablet, Take 1 tablet by mouth every 4 (four) hours as needed for severe pain., Disp: 30 tablet, Rfl: 0 .  potassium chloride SA (KLOR-CON) 20 MEQ tablet, TAKE 1 TABLET (20 MEQ) BY MOUTH DAILY., Disp: 30 tablet, Rfl: 2 .  sacubitril-valsartan (ENTRESTO) 24-26  MG, Take 1 tablet by mouth 2 (two) times daily., Disp: 60 tablet, Rfl: 2 .  spironolactone (ALDACTONE) 25 MG tablet, TAKE 1 TABLET (25 MG TOTAL) BY MOUTH DAILY., Disp: 30 tablet, Rfl: 2 .  torsemide (DEMADEX) 20 MG tablet, Take 1 tablet (20 mg total) by mouth daily., Disp: 30 tablet, Rfl: 2 .  TRUEplus Lancets 28G MISC, Use as directed, Disp: 100 each, Rfl: 4 .  Vitamin D, Ergocalciferol, (DRISDOL) 1.25 MG (50000 UNIT) CAPS capsule, Take 1 capsule (50,000 Units total) by mouth every 7  (seven) days., Disp: 4 capsule, Rfl: 2 Allergies  Allergen Reactions  . Bee Venom Hives, Itching and Swelling     Social History   Socioeconomic History  . Marital status: Legally Separated    Spouse name: Nira Conn  . Number of children: 2  . Years of education: Not on file  . Highest education level: Associate degree: occupational, Hotel manager, or vocational program  Occupational History  . Not on file  Tobacco Use  . Smoking status: Current Every Day Smoker    Types: Cigars, Cigarettes  . Smokeless tobacco: Former Systems developer    Types: Chew  . Tobacco comment: 6 daily  Vaping Use  . Vaping Use: Never used  Substance and Sexual Activity  . Alcohol use: Yes    Alcohol/week: 11.0 standard drinks    Types: 3 Cans of beer, 8 Shots of liquor per week    Comment: occasional  . Drug use: Yes    Types: Marijuana    Comment: ocassional - last time 05/08/2020  . Sexual activity: Not Currently  Other Topics Concern  . Not on file  Social History Narrative   Out of prison for 2 months.   Lives at home with his wife.   Social Determinants of Health   Financial Resource Strain: High Risk  . Difficulty of Paying Living Expenses: Very hard  Food Insecurity: Food Insecurity Present  . Worried About Charity fundraiser in the Last Year: Sometimes true  . Ran Out of Food in the Last Year: Sometimes true  Transportation Needs: Unmet Transportation Needs  . Lack of Transportation (Medical): Yes  . Lack of Transportation (Non-Medical): Yes  Physical Activity: Inactive  . Days of Exercise per Week: 0 days  . Minutes of Exercise per Session: 0 min  Stress: Not on file  Social Connections: Not on file  Intimate Partner Violence: Not on file    Physical Exam Vitals reviewed.  Constitutional:      Appearance: Normal appearance. He is normal weight.  HENT:     Head: Normocephalic.     Nose: Nose normal.     Mouth/Throat:     Mouth: Mucous membranes are moist.     Pharynx: Oropharynx is  clear.  Eyes:     Conjunctiva/sclera: Conjunctivae normal.     Pupils: Pupils are equal, round, and reactive to light.  Cardiovascular:     Rate and Rhythm: Normal rate and regular rhythm.     Pulses: Normal pulses.     Heart sounds: Normal heart sounds.  Abdominal:     General: Abdomen is flat.     Palpations: Abdomen is soft.  Musculoskeletal:        General: Normal range of motion.     Right lower leg: No edema.  Skin:    General: Skin is warm and dry.     Capillary Refill: Capillary refill takes less than 2 seconds.  Neurological:     General: No  focal deficit present.     Mental Status: He is alert. Mental status is at baseline.  Psychiatric:        Mood and Affect: Mood normal.     Arrived for home visit for Scott Greer who was alert and oriented reporting to be feeling good. Scott Greer followed up with ortho doctor yesterday and all went good. Medications were reviewed and confirmed on pill packs which were delivered by [facility]. Torsemide missing in pill packs. Mithran has left over torsemide in bottle. I filled up morning row of pill box for Torsemide. Scott Greer was instructed on how to utilize pill packs and when to take the Torsemide. Scott Greer understood same. Vitals were obtained and confirmed as noted. Scott Greer denied shortness of breath, dizziness, chest pain or trouble getting around with his crutches and wheel chair. Home visit complete. I will see Scott Greer in one week.   Scott Greer with [facility] notified about Torsemide, he reports it is too soon to refill the pill packs as Torsemide was just filled with a 90 days supply. I will continue to fill one weeks of Torsemide out of pill bottle supply.   Refills: NONE     Future Appointments  Date Time Provider [place]  03/30/2021  9:30 AM [name], MD [facility] None  03/31/2021 10:00 AM [facility] CCC-MM SOCIAL WORKER 2 [facility] None  04/11/2021  8:30 AM [facility] PA/NP [facility] None  04/20/2021 10:30 AM [facility] CCC-MM CARE MANAGER  [facility] None  04/21/2021  3:30 PM Persons, [name], PA [facility] None     ACTION: Home visit completed Next visit planned for ONE WEEK

## 2021-03-22 NOTE — Telephone Encounter (Signed)
Pt requested call to discuss financial assistance.  Pt continues to struggle with his bills- CSW had provided assistance directly with bills in the past and pt has maxed out assistance from [employer] and [employer] program with [employer].  CSW assisted in applying for crisis assistance through [facility] but pt has not heard from them yet- encouraged him to call them to check on status.  CSW had also supplied with list of local churches and other programs that can potentially assist but pt has misplaced the list- CSW emailed pt another copy.  Will continue to follow and assist as needed  Burna Sis, LCSW Clinical Social Worker [facility] Desk#: [phone] Cell#: [phone]

## 2021-03-23 DIAGNOSIS — E1152 Type 2 diabetes mellitus with diabetic peripheral angiopathy with gangrene: Secondary | ICD-10-CM | POA: Diagnosis not present

## 2021-03-24 DIAGNOSIS — E1152 Type 2 diabetes mellitus with diabetic peripheral angiopathy with gangrene: Secondary | ICD-10-CM | POA: Diagnosis not present

## 2021-03-27 DIAGNOSIS — E1152 Type 2 diabetes mellitus with diabetic peripheral angiopathy with gangrene: Secondary | ICD-10-CM | POA: Diagnosis not present

## 2021-03-28 DIAGNOSIS — E1152 Type 2 diabetes mellitus with diabetic peripheral angiopathy with gangrene: Secondary | ICD-10-CM | POA: Diagnosis not present

## 2021-03-29 DIAGNOSIS — E1152 Type 2 diabetes mellitus with diabetic peripheral angiopathy with gangrene: Secondary | ICD-10-CM | POA: Diagnosis not present

## 2021-03-30 ENCOUNTER — Ambulatory Visit (HOSPITAL_BASED_OUTPATIENT_CLINIC_OR_DEPARTMENT_OTHER): Payer: Medicaid Other | Admitting: Internal Medicine

## 2021-03-30 ENCOUNTER — Encounter: Payer: Self-pay | Admitting: Internal Medicine

## 2021-03-30 ENCOUNTER — Other Ambulatory Visit: Payer: Self-pay

## 2021-03-30 VITALS — BP 127/71 | HR 78 | Resp 16

## 2021-03-30 DIAGNOSIS — E1121 Type 2 diabetes mellitus with diabetic nephropathy: Secondary | ICD-10-CM | POA: Diagnosis not present

## 2021-03-30 DIAGNOSIS — Z5321 Procedure and treatment not carried out due to patient leaving prior to being seen by health care provider: Secondary | ICD-10-CM

## 2021-03-30 LAB — GLUCOSE, POCT (MANUAL RESULT ENTRY): POC Glucose: 228 mg/dl — AB (ref 70–99)

## 2021-03-30 NOTE — Progress Notes (Signed)
Patient's appointment was at 9:30 AM.  He arrived at 10:10 AM.  Advised that we can still see him.  He was placed into 10:50 slot.  I was told that patient left after 11 because he was tired of waiting.  I tried calling patient on his cell phone around 12 noon.  I left a message letting him know that I was reaching out to him to see about rescheduling his appointment.  I will have our front desk reach out to him again to try to get him rescheduled.

## 2021-03-31 ENCOUNTER — Other Ambulatory Visit: Payer: Self-pay

## 2021-03-31 DIAGNOSIS — E1152 Type 2 diabetes mellitus with diabetic peripheral angiopathy with gangrene: Secondary | ICD-10-CM | POA: Diagnosis not present

## 2021-03-31 NOTE — Patient Instructions (Signed)
Visit Information  [name]  - as a part of your Medicaid benefit, you are eligible for care management and care coordination services at no cost or copay. I was unable to reach you by phone today but would be happy to help you with your health related needs. Please feel free to call me @ [phone].   A member of the Managed Medicaid care management team will reach out to you again over the next 30 days.   Gus Puma, BSW, [facility]  [employer] Risk Managed Medicaid Team  [phone]

## 2021-03-31 NOTE — Patient Outreach (Signed)
Care Coordination  03/31/2021  [name] September 28, 1956 [id number]   Medicaid Managed Care   Unsuccessful Outreach Note  03/31/2021 Name: Scott Greer MRN: [id number] DOB: 1956-06-10  Referred by: [name], MD Reason for referral : High Risk Managed Medicaid (MM Social Work [employer])   An unsuccessful telephone outreach was attempted today. The patient was referred to the case management team for assistance with care management and care coordination.   Follow Up Plan: The care management team will reach out to the patient again over the next 30 days.   Gus Puma, BSW, [facility]  [employer] Risk Managed Medicaid Team  [phone]

## 2021-04-01 DIAGNOSIS — E1152 Type 2 diabetes mellitus with diabetic peripheral angiopathy with gangrene: Secondary | ICD-10-CM | POA: Diagnosis not present

## 2021-04-02 DIAGNOSIS — E1152 Type 2 diabetes mellitus with diabetic peripheral angiopathy with gangrene: Secondary | ICD-10-CM | POA: Diagnosis not present

## 2021-04-03 DIAGNOSIS — E1152 Type 2 diabetes mellitus with diabetic peripheral angiopathy with gangrene: Secondary | ICD-10-CM | POA: Diagnosis not present

## 2021-04-04 ENCOUNTER — Telehealth (HOSPITAL_COMMUNITY): Payer: Self-pay | Admitting: Licensed Clinical Social Worker

## 2021-04-04 ENCOUNTER — Telehealth (HOSPITAL_COMMUNITY): Payer: Self-pay

## 2021-04-04 ENCOUNTER — Other Ambulatory Visit: Payer: Self-pay | Admitting: Internal Medicine

## 2021-04-04 ENCOUNTER — Telehealth: Payer: Self-pay

## 2021-04-04 DIAGNOSIS — E1152 Type 2 diabetes mellitus with diabetic peripheral angiopathy with gangrene: Secondary | ICD-10-CM | POA: Diagnosis not present

## 2021-04-04 NOTE — Telephone Encounter (Signed)
Spoke to Scott Greer who reports he is taking his medications appropriately with his pick packs from [facility] and his single dose of Torsemide out of the bottles due to it not being added I the pill packs for this month. Fabricio reports he is having no medical complaints at this time but is overly frustrated with his social status with his financial concerns. I advised him to be sure he follows up with the resources Jenna and [facility] have provided him with. We discussed appointments and home visit plan for next week. Call complete.

## 2021-04-04 NOTE — Telephone Encounter (Signed)
CSW called CSW to speak about further financial assistance.  Pt has already followed up on all known assistance options in this area and had been receiving rental/utility assistance for over a year through [facility] and [employer] so is no longer eligible for assistance through their services.  Has also already received assistance through [employer] and with anticipated continued issues with paying for housing due to staying in apartment that is more than he can afford we are unable to assist again at this time.  CSW encouraged pt to call his [facility] Member services to ask if they have any housing options as some Medicaid plans will assist with one time housing costs.    Pt has been accepted to the waitlist for the [employer]oucher program but he has not heard anything about being accepted for an apartment.  Pt also states he should be getting another back payment from [facility] but has not heard anything- CSW provided pt with their number and encouraged to call and speak to a representative about this.  Will continue to follow and assist as needed  Burna Sis, LCSW Clinical Social Worker [facility] Desk#: [phone] Cell#: [phone]

## 2021-04-04 NOTE — Telephone Encounter (Signed)
Harrold Donath with [name] Medicaid called concerning the Rx's for Oxycodone for patient.  CB# [phone].  Please advise.  Thank you.

## 2021-04-04 NOTE — Patient Outreach (Signed)
Asked Scott Greer for refill of Oxy to [facility].

## 2021-04-05 ENCOUNTER — Other Ambulatory Visit: Payer: Self-pay | Admitting: Physician Assistant

## 2021-04-05 DIAGNOSIS — Z419 Encounter for procedure for purposes other than remedying health state, unspecified: Secondary | ICD-10-CM | POA: Diagnosis not present

## 2021-04-05 DIAGNOSIS — E1152 Type 2 diabetes mellitus with diabetic peripheral angiopathy with gangrene: Secondary | ICD-10-CM | POA: Diagnosis not present

## 2021-04-05 MED ORDER — OXYCODONE-ACETAMINOPHEN 5-325 MG PO TABS: 1.0000 | ORAL_TABLET | Freq: Four times a day (QID) | ORAL | 0 refills | Status: DC | PRN

## 2021-04-05 NOTE — Telephone Encounter (Signed)
[facility] as [facility] does not take escribe for narcotics

## 2021-04-05 NOTE — Patient Outreach (Signed)
Spoke with Autumn at office, they said this is post-op pain meds. Don't want to give 30 days due to this. Will leave pain meds as is (5 day supply). Asked for a refill as well

## 2021-04-05 NOTE — Telephone Encounter (Signed)
Requesting refill on pain medication for pt.  I called and sw Harrold Donath and he wanted to make sure that the rx that the pt was picking up twice a month did not need to be written as a one month supply that would increase his qty. I advised that this is a post operative pain management and the hope is to wean off pain medications or if unable it was dictated that a pain management refill would be needed. So no change to qty but would ask about refill. [facility] as [facility] deliver change made in chart.

## 2021-04-05 NOTE — Patient Outreach (Signed)
Dr. Persons called and stated [facility]. She was having issues sending to [facility]. I called and let patient know

## 2021-04-06 DIAGNOSIS — E1152 Type 2 diabetes mellitus with diabetic peripheral angiopathy with gangrene: Secondary | ICD-10-CM | POA: Diagnosis not present

## 2021-04-07 ENCOUNTER — Other Ambulatory Visit: Payer: Self-pay

## 2021-04-07 ENCOUNTER — Ambulatory Visit: Payer: Medicaid Other | Attending: Internal Medicine | Admitting: Internal Medicine

## 2021-04-07 ENCOUNTER — Encounter: Payer: Self-pay | Admitting: Internal Medicine

## 2021-04-07 VITALS — BP 120/64 | HR 87 | Resp 16

## 2021-04-07 DIAGNOSIS — Z89512 Acquired absence of left leg below knee: Secondary | ICD-10-CM

## 2021-04-07 DIAGNOSIS — Z9181 History of falling: Secondary | ICD-10-CM

## 2021-04-07 DIAGNOSIS — I152 Hypertension secondary to endocrine disorders: Secondary | ICD-10-CM | POA: Diagnosis not present

## 2021-04-07 DIAGNOSIS — D649 Anemia, unspecified: Secondary | ICD-10-CM | POA: Diagnosis not present

## 2021-04-07 DIAGNOSIS — E13621 Other specified diabetes mellitus with foot ulcer: Secondary | ICD-10-CM

## 2021-04-07 DIAGNOSIS — F17200 Nicotine dependence, unspecified, uncomplicated: Secondary | ICD-10-CM

## 2021-04-07 DIAGNOSIS — Z23 Encounter for immunization: Secondary | ICD-10-CM

## 2021-04-07 DIAGNOSIS — E1121 Type 2 diabetes mellitus with diabetic nephropathy: Secondary | ICD-10-CM | POA: Diagnosis not present

## 2021-04-07 DIAGNOSIS — E1159 Type 2 diabetes mellitus with other circulatory complications: Secondary | ICD-10-CM | POA: Diagnosis not present

## 2021-04-07 DIAGNOSIS — I96 Gangrene, not elsewhere classified: Secondary | ICD-10-CM

## 2021-04-07 DIAGNOSIS — F17210 Nicotine dependence, cigarettes, uncomplicated: Secondary | ICD-10-CM | POA: Diagnosis not present

## 2021-04-07 DIAGNOSIS — Z1211 Encounter for screening for malignant neoplasm of colon: Secondary | ICD-10-CM

## 2021-04-07 DIAGNOSIS — I5042 Chronic combined systolic (congestive) and diastolic (congestive) heart failure: Secondary | ICD-10-CM

## 2021-04-07 DIAGNOSIS — L97509 Non-pressure chronic ulcer of other part of unspecified foot with unspecified severity: Secondary | ICD-10-CM | POA: Diagnosis not present

## 2021-04-07 DIAGNOSIS — E1152 Type 2 diabetes mellitus with diabetic peripheral angiopathy with gangrene: Secondary | ICD-10-CM | POA: Diagnosis not present

## 2021-04-07 LAB — GLUCOSE, POCT (MANUAL RESULT ENTRY): POC Glucose: 171 mg/dl — AB (ref 70–99)

## 2021-04-07 NOTE — Progress Notes (Signed)
Patient ID: Scott Greer, male    DOB: Jan 28, 1956  MRN: [id number]  CC: Hospitalization Follow-up   Subjective: Scott Greer is a 65 y.o. male who presents for chronic ds management.  Patient presented more than half hour late for his appointment. 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  Patient was hospitalized in April of this year for infection of left foot.  Infection was too extensive and subsequently had to have left BKA.  He has been following up with Dr. Jess Barters PA since then.  His stump has healed well.  He was given prescription for prosthesis and will be meeting with someone next week to be fitted.  For now he has been using crutches.  Has a manual wheelchair at home but has not been using that much.  Reports that he has fell several times at home because he feels off balance since BKA.  Was supposed to have home PT after discharge from the hospital but states he was never called. -Noted to be anemic during hospitalization.  Was transfused 1 unit of blood.  Denies any blood in the stools.  He has never had colonoscopy for colon cancer screening.  DM: He is supposed to be on metformin and Jardiance.  He does not know the names of his medicines and does not have them with him.  Not checking blood sugars regularly.  Checked once a week when he is seen by EMT Loyce Dys.  Reports compliance with taking his medicines. Endorses poor vision.  History of glaucoma.  Overdue for eye exam. Complains of having ulcer on the ball of the right foot which she states also has been keeping an eye on and has debrided for him.  It is malodorous.  He has been doing dressing changes once a day.  CHF: He does not know his medications but  states compliance with his medicines that are mailed to him.  He is supposed to be on Entresto, spironolactone, digoxin, carvedilol, amiodarone denies any shortness of breath at rest or on exertion.  No chest pains.  No PND orthopnea.  No edema in the right lower extremity.  Tobacco dependence: Smokes 6 cigars a day.  He has smoked since the age of 83.  However he quit when he was incarcerated from 1993-2020.  Not wanting to quit stating that too many things have happened to him since he was released from prison Endorses history of excessive EtOH use but states he has stopped since hospitalization.  Drinks a beer occasionally. Patient Active Problem List   Diagnosis Date Noted  . Status post below-knee amputation ([place]) 02/17/2021  . Wound dehiscence   . Gangrene of toe of left foot ([place])   . Cutaneous abscess of left foot   . Left foot infection 02/07/2021  . Leukocytosis 02/07/2021  . Thrombocytosis 02/07/2021  . Hyponatremia 02/07/2021  . AKI (acute kidney injury) ([place]) 02/07/2021  . Hyperglycemia due to diabetes mellitus ([place]) 02/07/2021  . Hypoalbuminemia due to protein-calorie malnutrition ([place]) 02/07/2021  . Osteomyelitis of second toe of left foot ([place])   . New onset of congestive heart failure ([place]) 12/26/2020  . CKD (chronic kidney disease) stage 3, GFR 30-59 ml/min (H[facility]) 12/26/2020  . Acute systolic CHF (congestive  heart failure) ([place]) 12/26/2020  . Anemia, chronic disease 06/22/2020  . Amputee, great toe, right ([place]) 06/20/2020  . Normocytic anemia 06/20/2020  . Erectile dysfunction associated with type 2 diabetes mellitus ([place]) 06/20/2020  . Positive for macroalbuminuria 10/24/2019  . Amputation of left great toe ([place]) 10/23/2019  . Tobacco dependence 10/23/2019  . Osteomyelitis of great toe of right foot ([place]) 06/04/2019  . Diabetic foot infection ([place])   . Noncompliance   . Glaucoma   . Hyperlipidemia   . Essential hypertension 11/06/2003  . Diabetes mellitus ([place])  11/05/1997     Current Outpatient Medications on File Prior to Visit  Medication Sig Dispense Refill  . amiodarone (PACERONE) 200 MG tablet Take 1 tablet (200 mg total) by mouth daily. 30 tablet 6  . aspirin 81 MG chewable tablet Chew 1 tablet (81 mg total) by mouth daily. 30 tablet 6  . atorvastatin (LIPITOR) 10 MG tablet Take 1 tablet (10 mg total) by mouth daily. 30 tablet 6  . Blood Glucose Monitoring Suppl (TRUE METRIX METER) w/Device KIT Use as directed 1 kit 0  . carvedilol (COREG) 3.125 MG tablet Take 1 tablet (3.125 mg total) by mouth 2 (two) times daily with a meal. 60 tablet 2  . digoxin (LANOXIN) 0.125 MG tablet Take 1 tablet (0.125 mg total) by mouth daily. 30 tablet 6  . empagliflozin (JARDIANCE) 10 MG TABS tablet Take 1 tablet (10 mg total) by mouth daily. 30 tablet 6  . gabapentin (NEURONTIN) 300 MG capsule TAKE ONE CAPSULE BY MOUTH THREE TIMES DAILY 90 capsule 0  . glucose blood (TRUE METRIX BLOOD GLUCOSE TEST) test strip Use as instructed 100 each 12  . metFORMIN (GLUCOPHAGE) 500 MG tablet Take 1 tablet (500 mg total) by mouth 2 (two) times daily with a meal. 60 tablet 1  . oxyCODONE-acetaminophen (PERCOCET/ROXICET) 5-325 MG tablet Take 1 tablet by mouth every 6 (six) hours as needed for severe pain. 30 tablet 0  . potassium chloride SA (KLOR-CON) 20 MEQ tablet TAKE 1 TABLET (20 MEQ) BY MOUTH DAILY. 30 tablet 2  . sacubitril-valsartan (ENTRESTO) 24-26 MG Take 1 tablet by mouth 2 (two) times daily. 60 tablet 2  . spironolactone (ALDACTONE) 25 MG tablet TAKE 1 TABLET (25 MG TOTAL) BY MOUTH DAILY. 30 tablet 2  . torsemide (DEMADEX) 20 MG tablet Take 1 tablet (20 mg total) by mouth daily. 30 tablet 2  . TRUEplus Lancets 28G MISC Use as directed 100 each 4  . Vitamin D, Ergocalciferol, (DRISDOL) 1.25 MG (50000 UNIT) CAPS capsule Take 1 capsule (50,000 Units total) by mouth every 7 (seven) days. 4 capsule 2   No current facility-administered medications on file prior to visit.     Allergies  Allergen Reactions  . Bee Venom Hives, Itching and Swelling    Social History   Socioeconomic History  . Marital status: Legally Separated    Spouse name: Nira Conn  . Number of children: 2  . Years of education: Not on file  . Highest education level: Associate degree: occupational, Hotel manager, or vocational program  Occupational History  . Not on file  Tobacco Use  . Smoking status: Current Every Day Smoker    Types: Cigars, Cigarettes  . Smokeless tobacco: Former Systems developer    Types: Chew  . Tobacco comment: 6 daily  Vaping Use  . Vaping Use: Never used  Substance and Sexual Activity  . Alcohol use: Yes    Alcohol/week: 11.0 standard drinks    Types: 3 Cans of  beer, 8 Shots of liquor per week    Comment: occasional  . Drug use: Yes    Types: Marijuana    Comment: ocassional - last time 05/08/2020  . Sexual activity: Not Currently  Other Topics Concern  . Not on file  Social History Narrative   Out of prison for 2 months.   Lives at home with his wife.   Social Determinants of Health   Financial Resource Strain: High Risk  . Difficulty of Paying Living Expenses: Very hard  Food Insecurity: Food Insecurity Present  . Worried About Charity fundraiser in the Last Year: Sometimes true  . Ran Out of Food in the Last Year: Sometimes true  Transportation Needs: Unmet Transportation Needs  . Lack of Transportation (Medical): Yes  . Lack of Transportation (Non-Medical): Yes  Physical Activity: Inactive  . Days of Exercise per Week: 0 days  . Minutes of Exercise per Session: 0 min  Stress: Not on file  Social Connections: Not on file  Intimate Partner Violence: Not on file    Family History  Problem Relation Age of Onset  . Stroke Mother   . Diabetes Mother        Toward end of life    Past Surgical History:  Procedure Laterality Date  . AMPUTATION Left 06/05/2019   Procedure: LEFT GREAT TOE AMPUTATION;  Surgeon: Newt Minion, MD;  Location: [facility];   Service: Orthopedics;  Laterality: Left;  . AMPUTATION Left 07/10/2019   Procedure: LEFT FOOT 1ST RAY AMPUTATION;  Surgeon: Newt Minion, MD;  Location: [facility];  Service: Orthopedics;  Laterality: Left;  . AMPUTATION Right 05/25/2020   Procedure: RIGHT GREAT TOE AMPUTATION;  Surgeon: Newt Minion, MD;  Location: [facility];  Service: Orthopedics;  Laterality: Right;  . AMPUTATION Left 01/25/2021   Procedure: LEFT 2ND TOE AMPUTATION;  Surgeon: Newt Minion, MD;  Location: [facility];  Service: Orthopedics;  Laterality: Left;  . AMPUTATION Left 02/08/2021   Procedure: LEFT TRANSMETATARSAL AMPUTATION;  Surgeon: Newt Minion, MD;  Location: [facility];  Service: Orthopedics;  Laterality: Left;  . AMPUTATION Left 02/10/2021   Procedure: AMPUTATION BELOW KNEE;  Surgeon: Newt Minion, MD;  Location: [facility];  Service: Orthopedics;  Laterality: Left;  . NO PAST SURGERIES    . RIGHT/LEFT HEART CATH AND CORONARY ANGIOGRAPHY N/A 01/02/2021   Procedure: RIGHT/LEFT HEART CATH AND CORONARY ANGIOGRAPHY;  Surgeon: Lorretta Harp, MD;  Location: [facility];  Service: Cardiovascular;  Laterality: N/A;    ROS: Review of Systems Negative except as stated above  PHYSICAL EXAM: BP 120/64   Pulse 87   Resp 16   SpO2 96%   Physical Exam General appearance - alert, well appearing, older African-American male and in no distress.  He is sitting in a wheelchair.  He has his crutches with him. Mental status -patient very talkative and at times seems to lack insight into the complexity of his medical issues  neck - supple, no significant adenopathy Chest - clear to auscultation, no wheezes, rales or rhonchi, symmetric air entry Heart - normal rate, regular rhythm, normal S1, S2, no murmurs, rubs, clicks or gallops Extremities -trace edema right lower extremity. Skin -stump of the left BKA is well-healed. Has had amputation of the right big toe.  He has dry gangrene noted of the second toe and first metatarsal.   Malodorous also on the ball of the first and second metatarsal.  No drainage expressed.  Underlying fat tissue can be seen.  He also has a small also on the dorsal surface of the second toe.         Results for orders placed or performed in visit on 04/07/21  POCT glucose (manual entry)  Result Value Ref Range   POC Glucose 171 (A) 70 - 99 mg/dl   Lab Results  Component Value Date   HGBA1C 9.1 (H) 02/08/2021    CMP Latest Ref Rng & Units 02/15/2021 02/14/2021 02/13/2021  Glucose 70 - 99 mg/dL 140(H) 146(H) 185(H)  BUN 8 - 23 mg/dL 19 20 25(H)  Creatinine 0.61 - 1.24 mg/dL 1.24 1.05 1.20  Sodium 135 - 145 mmol/L 136 136 133(L)  Potassium 3.5 - 5.1 mmol/L 4.1 4.3 4.6  Chloride 98 - 111 mmol/L 104 107 105  CO2 22 - 32 mmol/L _0 Calcium 8.9 - 10.3 mg/dL 9.0 8.7(L) 8.5(L)  Total Protein 6.5 - 8.1 g/dL - - -  Total Bilirubin 0.3 - 1.2 mg/dL - - -  Alkaline Phos 38 - 126 U/L - - -  AST 15 - 41 U/L - - -  ALT 0 - 44 U/L - - -   Lipid Panel     Component Value Date/Time   CHOL 125 12/14/2020 1156   TRIG 90 12/14/2020 1156   HDL 61 12/14/2020 1156   CHOLHDL 2.0 12/14/2020 1156   LDLCALC 47 12/14/2020 1156    CBC    Component Value Date/Time   WBC 10.7 (H) 02/15/2021 0146   RBC 3.21 (L) 02/15/2021 0146   HGB 8.5 (L) 02/15/2021 0146   HGB 13.2 12/14/2020 1156   HCT 26.9 (L) 02/15/2021 0146   HCT 41.7 12/14/2020 1156   PLT 488 (H) 02/15/2021 0146   PLT 278 12/14/2020 1156   MCV 83.8 02/15/2021 0146   MCV 87 12/14/2020 1156   MCH 26.5 02/15/2021 0146   MCHC 31.6 02/15/2021 0146   RDW 16.8 (H) 02/15/2021 0146   RDW 14.1 12/14/2020 1156   LYMPHSABS 0.9 02/06/2021 2057   LYMPHSABS 0.7 12/14/2020 1156   MONOABS 2.9 (H) 02/06/2021 2057   EOSABS 0.0 02/06/2021 2057   EOSABS 0.0 12/14/2020 1156   BASOSABS 0.0 02/06/2021 2057   BASOSABS 0.0 12/14/2020 1156   Lab Results  Component Value Date   HGBA1C 9.1 (H) 02/08/2021    ASSESSMENT AND PLAN: 1. Type 2 diabetes  mellitus with other circulatory complications ([place]) Advised patient to check blood sugars at least once a day and record readings or bring his glucometer with him on next visit.  Unable to make any adjustments on his medications not knowing what his blood sugars have been doing.  Discussed importance of good diabetes control to help prevent further complications of the disease.  Healthy eating habits discussed. - POCT glucose (manual entry) - Ambulatory referral to Ophthalmology - CBC - Comprehensive metabolic panel  2. Hypertension associated with diabetes ([place]) Controlled on current medications.  3. Chronic combined systolic and diastolic CHF (congestive heart failure) ([place]) Compensated.  Continue current medications and low-salt diet.  Followed by cardiology.  4. Dry gangrene ([place]) 5. Foot ulcer due to secondary DM [facility]) I told patient that he is at risk of losing his right foot.  I recommend referral back to Dr. Sharol Given ASAP.  Patient states that even if Dr. Sharol Given recommends further amputation he will not agree to it. Recommend that he continue his dressing changes at least once a day.  We gave him  some 4 x 4 gauze and surgical tape. - Ambulatory referral to Orthopedic Surgery  6. History of recent fall - Ambulatory referral to [facility]  7. Status post below-knee amputation of left lower extremity ([place]) - Ambulatory referral to Home Health  8. Tobacco dependence Advised to quit.  Patient not ready to give a trial of quitting.  9. Normocytic anemia - Iron, TIBC and Ferritin Panel  10. Need for vaccination against Streptococcus pneumoniae Given  11. Screening for colon cancer - Ambulatory referral to Gastroenterology    Patient was given the opportunity to ask questions.  Patient verbalized understanding of the plan and was able to repeat key elements of the plan.   Orders Placed This Encounter  Procedures  . Pneumococcal conjugate vaccine 13-valent IM  . CBC  .  Comprehensive metabolic panel  . Iron, TIBC and Ferritin Panel  . Ambulatory referral to Ophthalmology  . Ambulatory referral to Gastroenterology  . Ambulatory referral to Orthopedic Surgery  . Ambulatory referral to Home Health  . POCT glucose (manual entry)     Requested Prescriptions    No prescriptions requested or ordered in this encounter    Return in about 2 months (around 06/07/2021).  Karle Plumber, MD, FACP

## 2021-04-07 NOTE — Patient Instructions (Addendum)
I will request home physical therapy for you. You should do dressing changes to the right foot once to twice a day.  I have requested an appointment for you to see Dr. Lajoyce Corners about the ulcer on this foot.  Please check your blood sugars at least once a day before breakfast and bring in your readings on your subsequent visit.  Please remember to bring all medications with you to your office visits.   Pneumococcal Conjugate Vaccine (PCV13): What You Need to Know 1. Why get vaccinated? Pneumococcal conjugate vaccine (PCV13) can prevent pneumococcal disease. Pneumococcal disease refers to any illness caused by pneumococcal bacteria. These bacteria can cause many types of illnesses, including pneumonia, which is an infection of the lungs. Pneumococcal bacteria are one of the most common causes of pneumonia. Besides pneumonia, pneumococcal bacteria can also cause:  Ear infections  Sinus infections  Meningitis (infection of the tissue covering the brain and spinal cord)  Bacteremia (infection of the blood) Anyone can get pneumococcal disease, but children under 42 years old, people with certain medical conditions, adults 65 years or older, and cigarette smokers are at the highest risk. Most pneumococcal infections are mild. However, some can result in long-term problems, such as brain damage or hearing loss. Meningitis, bacteremia, and pneumonia caused by pneumococcal disease can be fatal. 2. PCV13 PCV13 protects against 13 types of bacteria that cause pneumococcal disease. Infants and young children usually need 4 doses of pneumococcal conjugate vaccine, at ages 17, 39, 74, and 12-15 months. Older children (through age 76 months) may be vaccinated if they did not receive the recommended doses. A dose of PCV13 is also recommended for adults and children 6 years or older with certain medical conditions if they did not already receive PCV13. This vaccine may be given to healthy adults 65 years or older who  did not already receive PCV13, based on discussions between the patient and health care provider. 3. Talk with your health care provider Tell your vaccination provider if the person getting the vaccine:  Has had an allergic reaction after a previous dose of PCV13, to an earlier pneumococcal conjugate vaccine known as PCV7, or to any vaccine containing diphtheria toxoid (for example, DTaP), or has any severe, life-threatening allergies In some cases, your health care provider may decide to postpone PCV13 vaccination until a future visit. People with minor illnesses, such as a cold, may be vaccinated. People who are moderately or severely ill should usually wait until they recover before getting PCV13. Your health care provider can give you more information. 4. Risks of a vaccine reaction  Redness, swelling, pain, or tenderness where the shot is given, and fever, loss of appetite, fussiness (irritability), feeling tired, headache, and chills can happen after PCV13 vaccination. Young children may be at increased risk for seizures caused by fever after PCV13 if it is administered at the same time as inactivated influenza vaccine. Ask your health care provider for more information. People sometimes faint after medical procedures, including vaccination. Tell your provider if you feel dizzy or have vision changes or ringing in the ears. As with any medicine, there is a very remote chance of a vaccine causing a severe allergic reaction, other serious injury, or death. 5. What if there is a serious problem? An allergic reaction could occur after the vaccinated person leaves the clinic. If you see signs of a severe allergic reaction (hives, swelling of the face and throat, difficulty breathing, a fast heartbeat, dizziness, or weakness), call 9-1-1 and  get the person to the nearest hospital. For other signs that concern you, call your health care provider. Adverse reactions should be reported to the Vaccine  Adverse Event Reporting System (VAERS). Your health care provider will usually file this report, or you can do it yourself. Visit the VAERS website at www.vaers.LAgents.no or call 4318366327. VAERS is only for reporting reactions, and VAERS staff members do not give medical advice. 6. The National Vaccine Injury Compensation Program The [employer] Vaccine Injury Compensation Program (VICP) is a federal program that was created to compensate people who may have been injured by certain vaccines. Claims regarding alleged injury or death due to vaccination have a time limit for filing, which may be as short as two years. Visit the VICP website at SpiritualWord.at or call (779)515-2772 to learn about the program and about filing a claim. 7. How can I learn more?  Ask your health care provider.  Call your local or state health department.  Visit the website of the Food and Drug Administration (FDA) for vaccine package inserts and additional information at FinderList.no.  Contact the Centers for Disease Control and Prevention (CDC): ? Call 367-497-2670 (1-800-CDC-INFO) or ? Visit CDC's website at PicCapture.uy. Vaccine Information Statement PCV13 (06/10/2020) This information is not intended to replace advice given to you by your health care provider. Make sure you discuss any questions you have with your health care provider. Document Revised: 07/28/2020 Document Reviewed: 07/28/2020 Elsevier Patient Education  2021 [employer].

## 2021-04-08 ENCOUNTER — Other Ambulatory Visit: Payer: Self-pay | Admitting: Internal Medicine

## 2021-04-08 DIAGNOSIS — E1152 Type 2 diabetes mellitus with diabetic peripheral angiopathy with gangrene: Secondary | ICD-10-CM | POA: Diagnosis not present

## 2021-04-08 LAB — IRON,TIBC AND FERRITIN PANEL
Ferritin: 272 ng/mL (ref 30–400)
Iron Saturation: 14 % — ABNORMAL LOW (ref 15–55)
Iron: 29 ug/dL — ABNORMAL LOW (ref 38–169)
Total Iron Binding Capacity: 209 ug/dL — ABNORMAL LOW (ref 250–450)
UIBC: 180 ug/dL (ref 111–343)

## 2021-04-08 LAB — COMPREHENSIVE METABOLIC PANEL
ALT: 8 IU/L (ref 0–44)
AST: 9 IU/L (ref 0–40)
Albumin/Globulin Ratio: 1.3 (ref 1.2–2.2)
Albumin: 3.8 g/dL (ref 3.8–4.8)
Alkaline Phosphatase: 98 IU/L (ref 44–121)
BUN/Creatinine Ratio: 18 (ref 10–24)
BUN: 18 mg/dL (ref 8–27)
Bilirubin Total: 0.4 mg/dL (ref 0.0–1.2)
CO2: 25 mmol/L (ref 20–29)
Calcium: 9.2 mg/dL (ref 8.6–10.2)
Chloride: 96 mmol/L (ref 96–106)
Creatinine, Ser: 0.99 mg/dL (ref 0.76–1.27)
Globulin, Total: 2.9 g/dL (ref 1.5–4.5)
Glucose: 129 mg/dL — ABNORMAL HIGH (ref 65–99)
Potassium: 4.6 mmol/L (ref 3.5–5.2)
Sodium: 135 mmol/L (ref 134–144)
Total Protein: 6.7 g/dL (ref 6.0–8.5)
eGFR: 86 mL/min/{1.73_m2} (ref >59)

## 2021-04-08 LAB — CBC
Hematocrit: 28.1 % — ABNORMAL LOW (ref 37.5–51.0)
Hemoglobin: 9.4 g/dL — ABNORMAL LOW (ref 13.0–17.7)
MCH: 28.1 pg (ref 26.6–33.0)
MCHC: 33.5 g/dL (ref 31.5–35.7)
MCV: 84 fL (ref 79–97)
Platelets: 440 10*3/uL (ref 150–450)
RBC: 3.35 x10E6/uL — ABNORMAL LOW (ref 4.14–5.80)
RDW: 15.2 % (ref 11.6–15.4)
WBC: 10.9 10*3/uL — ABNORMAL HIGH (ref 3.4–10.8)

## 2021-04-08 MED ORDER — FERROUS SULFATE 325 (65 FE) MG PO TABS
325.0000 mg | ORAL_TABLET | Freq: Every day | ORAL | 1 refills | Status: DC
Start: 2021-04-08 — End: 2021-10-28

## 2021-04-08 NOTE — Progress Notes (Signed)
Let patient know that his anemia has improved slightly since hospitalization.  His anemia is due to his underlying chronic medical issues.  I recommend taking iron supplement daily.  I will send a prescription to his pharmacy.  Kidney and liver function tests look okay.

## 2021-04-09 DIAGNOSIS — E1152 Type 2 diabetes mellitus with diabetic peripheral angiopathy with gangrene: Secondary | ICD-10-CM | POA: Diagnosis not present

## 2021-04-10 ENCOUNTER — Telehealth: Payer: Self-pay

## 2021-04-10 ENCOUNTER — Other Ambulatory Visit: Payer: Self-pay

## 2021-04-10 ENCOUNTER — Telehealth (HOSPITAL_COMMUNITY): Payer: Self-pay

## 2021-04-10 ENCOUNTER — Telehealth (HOSPITAL_COMMUNITY): Payer: Self-pay | Admitting: Licensed Clinical Social Worker

## 2021-04-10 ENCOUNTER — Ambulatory Visit (INDEPENDENT_AMBULATORY_CARE_PROVIDER_SITE_OTHER): Payer: Medicaid Other | Admitting: Orthopedic Surgery

## 2021-04-10 DIAGNOSIS — E1152 Type 2 diabetes mellitus with diabetic peripheral angiopathy with gangrene: Secondary | ICD-10-CM | POA: Diagnosis not present

## 2021-04-10 DIAGNOSIS — Z89512 Acquired absence of left leg below knee: Secondary | ICD-10-CM

## 2021-04-10 DIAGNOSIS — M869 Osteomyelitis, unspecified: Secondary | ICD-10-CM

## 2021-04-10 NOTE — Telephone Encounter (Signed)
Referral received for home PT. Attempted to contact patient # [phone] to inquire if he has a preference for home health agencies. Message left with call back requested to this CM.   Call placed to # [phone] and the voicemail was full  for [facility]

## 2021-04-10 NOTE — Telephone Encounter (Signed)
Contacted pt to go over lab results pt didn't answer lvm  

## 2021-04-10 NOTE — Telephone Encounter (Signed)
Left message for Scott Greer reminding him of his appointment for tomorrow morning at 0830 [facility]. I will remind him again in the morning.

## 2021-04-10 NOTE — Telephone Encounter (Signed)
CSW received call from pt inquiring about utility/rental assistance again.  Pt has already received assistance from the patient care fund and has maxed out assistance options with [employer] where he received help with rent for over a year.  Also received max assistance from [employer] with [employer].  CSW assisted in applying for crisis assistance with [employer]HS but pt stated they were unable to help.  Provided information for [employer] rent/utility program who directed him towards other options- pt reports following up but no funds  Last week CSW suggested pt call member services with his [facility]- they have told him they will call him back this week to discuss potential options so hopeful they can provide some assistance.  CSW continues to encourage pt to apply for income based housing as he is living above his means- I have already assisted in applying for Project Based Voucher program through [employer]- confirm that he is still listed as being active on the waitlist but no clear timeline for when something might become available.  Unfortunately I think pt has already exhausted community resources at this time which I made clear to patient during this and multiple earlier conversations- urged pt to start considering more affordable housing options- pt gets frustrated with this suggestion as he doesn't want to move into a bad neighborhood and feels like other people are receiving more assistance than him- CSW emailed paramedic list of apartments in his price range to start considering.  Will continue to follow and assist as needed.  Burna Sis, LCSW Clinical Social Worker [facility] Desk#: [phone] Cell#: [phone]

## 2021-04-11 ENCOUNTER — Telehealth (HOSPITAL_COMMUNITY): Payer: Self-pay

## 2021-04-11 ENCOUNTER — Other Ambulatory Visit: Payer: Self-pay

## 2021-04-11 ENCOUNTER — Encounter (HOSPITAL_COMMUNITY): Payer: Self-pay

## 2021-04-11 ENCOUNTER — Other Ambulatory Visit: Payer: Self-pay | Admitting: Physician Assistant

## 2021-04-11 DIAGNOSIS — E1152 Type 2 diabetes mellitus with diabetic peripheral angiopathy with gangrene: Secondary | ICD-10-CM | POA: Diagnosis not present

## 2021-04-11 NOTE — Telephone Encounter (Signed)
Attempted to contact the patient again # [phone]  to inquire if he has a preference for home health agencies. Message left with call back requested to this CM.

## 2021-04-11 NOTE — Telephone Encounter (Signed)
Left message for Scott Greer who did not answer reminding him again of his appointment for today at 0830. I will continue to follow up. LCSW [name] made aware as well.

## 2021-04-12 DIAGNOSIS — E1152 Type 2 diabetes mellitus with diabetic peripheral angiopathy with gangrene: Secondary | ICD-10-CM | POA: Diagnosis not present

## 2021-04-12 NOTE — Telephone Encounter (Signed)
Attempted to contact the patient about the home health referral.  Message left with call back requested to this CM.  It is noted that patient has surgery scheduled for 04/14/2021.  Will hold off on home health until after that procedure and then re-evaluate need for services.  Message from Dr Laural Benes noting patient is requesting a motorized wheelchair as well as assistance with paying his bills. As per notes from [place], [place], and after speaking with Geraldine Solar, EMT, the patient has exhausted the community resources that are available for financial assistance.  They have been working extensively with the patient to address his housing and financial needs.   Regarding the power chair, Herbert Seta stated that unless his home has a ramp, which it does not,  the home would not be able to accommodate a power chair or a standard wheelchair.

## 2021-04-13 ENCOUNTER — Encounter (HOSPITAL_COMMUNITY): Payer: Self-pay | Admitting: Orthopedic Surgery

## 2021-04-13 DIAGNOSIS — E1152 Type 2 diabetes mellitus with diabetic peripheral angiopathy with gangrene: Secondary | ICD-10-CM | POA: Diagnosis not present

## 2021-04-14 ENCOUNTER — Inpatient Hospital Stay (HOSPITAL_COMMUNITY)
Admission: RE | Admit: 2021-04-14 | Discharge: 2021-04-17 | DRG: 617 | Disposition: A | Discharge status: Home / Self Care | Payer: Medicaid Other | Attending: Orthopedic Surgery | Admitting: Orthopedic Surgery

## 2021-04-14 ENCOUNTER — Ambulatory Visit (HOSPITAL_COMMUNITY): Payer: Medicaid Other | Admitting: Certified Registered Nurse Anesthetist

## 2021-04-14 ENCOUNTER — Encounter (HOSPITAL_COMMUNITY)
Admission: RE | Disposition: A | Discharge status: Home / Self Care | Payer: Self-pay | Source: Home / Self Care | Attending: Orthopedic Surgery

## 2021-04-14 ENCOUNTER — Encounter (HOSPITAL_COMMUNITY): Payer: Self-pay | Admitting: Orthopedic Surgery

## 2021-04-14 DIAGNOSIS — E785 Hyperlipidemia, unspecified: Secondary | ICD-10-CM | POA: Diagnosis not present

## 2021-04-14 DIAGNOSIS — L03115 Cellulitis of right lower limb: Secondary | ICD-10-CM | POA: Diagnosis not present

## 2021-04-14 DIAGNOSIS — Z89512 Acquired absence of left leg below knee: Secondary | ICD-10-CM | POA: Diagnosis not present

## 2021-04-14 DIAGNOSIS — E1169 Type 2 diabetes mellitus with other specified complication: Principal | ICD-10-CM | POA: Diagnosis present

## 2021-04-14 DIAGNOSIS — Z89411 Acquired absence of right great toe: Secondary | ICD-10-CM

## 2021-04-14 DIAGNOSIS — L02611 Cutaneous abscess of right foot: Secondary | ICD-10-CM | POA: Diagnosis not present

## 2021-04-14 DIAGNOSIS — F17290 Nicotine dependence, other tobacco product, uncomplicated: Secondary | ICD-10-CM | POA: Diagnosis not present

## 2021-04-14 DIAGNOSIS — M79671 Pain in right foot: Secondary | ICD-10-CM | POA: Diagnosis present

## 2021-04-14 DIAGNOSIS — E1165 Type 2 diabetes mellitus with hyperglycemia: Secondary | ICD-10-CM | POA: Diagnosis not present

## 2021-04-14 DIAGNOSIS — H409 Unspecified glaucoma: Secondary | ICD-10-CM | POA: Diagnosis present

## 2021-04-14 DIAGNOSIS — Z20822 Contact with and (suspected) exposure to covid-19: Secondary | ICD-10-CM | POA: Diagnosis present

## 2021-04-14 DIAGNOSIS — Z833 Family history of diabetes mellitus: Secondary | ICD-10-CM

## 2021-04-14 DIAGNOSIS — Z91030 Bee allergy status: Secondary | ICD-10-CM | POA: Diagnosis not present

## 2021-04-14 DIAGNOSIS — E871 Hypo-osmolality and hyponatremia: Secondary | ICD-10-CM | POA: Diagnosis not present

## 2021-04-14 DIAGNOSIS — M869 Osteomyelitis, unspecified: Secondary | ICD-10-CM | POA: Diagnosis not present

## 2021-04-14 DIAGNOSIS — E1152 Type 2 diabetes mellitus with diabetic peripheral angiopathy with gangrene: Secondary | ICD-10-CM | POA: Diagnosis not present

## 2021-04-14 DIAGNOSIS — L97516 Non-pressure chronic ulcer of other part of right foot with bone involvement without evidence of necrosis: Secondary | ICD-10-CM | POA: Diagnosis present

## 2021-04-14 DIAGNOSIS — E11621 Type 2 diabetes mellitus with foot ulcer: Secondary | ICD-10-CM | POA: Diagnosis not present

## 2021-04-14 DIAGNOSIS — I10 Essential (primary) hypertension: Secondary | ICD-10-CM | POA: Diagnosis present

## 2021-04-14 DIAGNOSIS — M86271 Subacute osteomyelitis, right ankle and foot: Secondary | ICD-10-CM | POA: Diagnosis not present

## 2021-04-14 DIAGNOSIS — Z89421 Acquired absence of other right toe(s): Secondary | ICD-10-CM | POA: Diagnosis not present

## 2021-04-14 HISTORY — DX: Heart failure, unspecified: I50.9

## 2021-04-14 HISTORY — PX: AMPUTATION: SHX166

## 2021-04-14 LAB — GLUCOSE, CAPILLARY
Glucose-Capillary: 213 mg/dL — ABNORMAL HIGH (ref 70–99)
Glucose-Capillary: 216 mg/dL — ABNORMAL HIGH (ref 70–99)
Glucose-Capillary: 215 mg/dL — ABNORMAL HIGH (ref 70–99)

## 2021-04-14 LAB — SARS CORONAVIRUS 2 BY RT PCR (HOSPITAL ORDER, PERFORMED IN ~~LOC~~ HOSPITAL LAB): SARS Coronavirus 2: NEGATIVE

## 2021-04-14 SURGERY — AMPUTATION, FOOT, RAY
Anesthesia: General | Site: Foot | Laterality: Right

## 2021-04-14 MED ORDER — GABAPENTIN 300 MG PO CAPS
300.0000 mg | ORAL_CAPSULE | Freq: Three times a day (TID) | ORAL | Status: DC
  Administered 2021-04-14 – 2021-04-17 (×9): 300 mg via ORAL
  Filled 2021-04-14 (×9): qty 1

## 2021-04-14 MED ORDER — MAGNESIUM SULFATE 2 GM/50ML IV SOLN
2.0000 g | Freq: Every day | INTRAVENOUS | Status: DC | PRN
  Filled 2021-04-14: qty 50

## 2021-04-14 MED ORDER — CHLORHEXIDINE GLUCONATE 0.12 % MT SOLN
OROMUCOSAL | Status: AC
  Administered 2021-04-14: 15 mL via OROMUCOSAL
  Filled 2021-04-14: qty 15

## 2021-04-14 MED ORDER — OXYCODONE HCL 5 MG PO TABS
ORAL_TABLET | ORAL | Status: AC
  Filled 2021-04-14: qty 2

## 2021-04-14 MED ORDER — MIDAZOLAM HCL 2 MG/2ML IJ SOLN
INTRAMUSCULAR | Status: AC
  Filled 2021-04-14: qty 2

## 2021-04-14 MED ORDER — LIDOCAINE 2% (20 MG/ML) 5 ML SYRINGE
INTRAMUSCULAR | Status: DC | PRN
  Administered 2021-04-14: 60 mg via INTRAVENOUS

## 2021-04-14 MED ORDER — PROPOFOL 10 MG/ML IV BOLUS
INTRAVENOUS | Status: AC
  Filled 2021-04-14: qty 20

## 2021-04-14 MED ORDER — CARVEDILOL 3.125 MG PO TABS
3.1250 mg | ORAL_TABLET | Freq: Once | ORAL | Status: AC
  Administered 2021-04-14: 3.125 mg via ORAL
  Filled 2021-04-14: qty 1

## 2021-04-14 MED ORDER — ASCORBIC ACID 500 MG PO TABS
1000.0000 mg | ORAL_TABLET | Freq: Every day | ORAL | Status: DC
  Administered 2021-04-14 – 2021-04-17 (×4): 1000 mg via ORAL
  Filled 2021-04-14 (×4): qty 2

## 2021-04-14 MED ORDER — PANTOPRAZOLE SODIUM 40 MG PO TBEC
40.0000 mg | DELAYED_RELEASE_TABLET | Freq: Every day | ORAL | Status: DC
  Administered 2021-04-14 – 2021-04-17 (×4): 40 mg via ORAL
  Filled 2021-04-14 (×4): qty 1

## 2021-04-14 MED ORDER — ORAL CARE MOUTH RINSE: 15.0000 mL | Freq: Once | OROMUCOSAL | Status: AC

## 2021-04-14 MED ORDER — ASPIRIN 81 MG PO CHEW
81.0000 mg | CHEWABLE_TABLET | Freq: Every day | ORAL | Status: DC
  Administered 2021-04-14 – 2021-04-17 (×4): 81 mg via ORAL
  Filled 2021-04-14 (×4): qty 1

## 2021-04-14 MED ORDER — CEFAZOLIN SODIUM-DEXTROSE 2-4 GM/100ML-% IV SOLN
2.0000 g | Freq: Three times a day (TID) | INTRAVENOUS | Status: DC
  Filled 2021-04-14 (×2): qty 100

## 2021-04-14 MED ORDER — BISACODYL 5 MG PO TBEC: 5.0000 mg | DELAYED_RELEASE_TABLET | Freq: Every day | ORAL | Status: DC | PRN

## 2021-04-14 MED ORDER — OXYCODONE HCL 5 MG PO TABS
5.0000 mg | ORAL_TABLET | ORAL | Status: DC | PRN
  Administered 2021-04-14 – 2021-04-15 (×5): 10 mg via ORAL
  Administered 2021-04-15: 5 mg via ORAL
  Administered 2021-04-15: 10 mg via ORAL
  Administered 2021-04-16: 5 mg via ORAL
  Filled 2021-04-14: qty 1
  Filled 2021-04-14 (×5): qty 2
  Filled 2021-04-14: qty 1

## 2021-04-14 MED ORDER — CEFAZOLIN SODIUM-DEXTROSE 2-4 GM/100ML-% IV SOLN
2.0000 g | INTRAVENOUS | Status: AC
  Administered 2021-04-14: 2 g via INTRAVENOUS
  Filled 2021-04-14: qty 100

## 2021-04-14 MED ORDER — ISOSORB DINITRATE-HYDRALAZINE 20-37.5 MG PO TABS
1.0000 | ORAL_TABLET | Freq: Three times a day (TID) | ORAL | Status: DC
  Administered 2021-04-14 – 2021-04-17 (×6): 1 via ORAL
  Filled 2021-04-14 (×9): qty 1

## 2021-04-14 MED ORDER — CHLORHEXIDINE GLUCONATE 0.12 % MT SOLN: 15.0000 mL | Freq: Once | OROMUCOSAL | Status: AC

## 2021-04-14 MED ORDER — SODIUM CHLORIDE 0.9 % IV SOLN
INTRAVENOUS | Status: DC
  Administered 2021-04-14: 1000 mL via INTRAVENOUS

## 2021-04-14 MED ORDER — 0.9 % SODIUM CHLORIDE (POUR BTL) OPTIME
TOPICAL | Status: DC | PRN
  Administered 2021-04-14: 1000 mL

## 2021-04-14 MED ORDER — ACETAMINOPHEN 325 MG PO TABS
325.0000 mg | ORAL_TABLET | Freq: Four times a day (QID) | ORAL | Status: DC | PRN
  Administered 2021-04-14: 650 mg via ORAL
  Filled 2021-04-14 (×2): qty 2

## 2021-04-14 MED ORDER — PROPOFOL 10 MG/ML IV BOLUS
INTRAVENOUS | Status: DC | PRN
  Administered 2021-04-14: 100 mg via INTRAVENOUS

## 2021-04-14 MED ORDER — SACUBITRIL-VALSARTAN 24-26 MG PO TABS
1.0000 | ORAL_TABLET | Freq: Two times a day (BID) | ORAL | Status: DC
  Administered 2021-04-14 – 2021-04-17 (×5): 1 via ORAL
  Filled 2021-04-14 (×6): qty 1

## 2021-04-14 MED ORDER — FENTANYL CITRATE (PF) 100 MCG/2ML IJ SOLN
INTRAMUSCULAR | Status: AC
  Filled 2021-04-14: qty 2

## 2021-04-14 MED ORDER — ONDANSETRON HCL 4 MG/2ML IJ SOLN: 4.0000 mg | Freq: Four times a day (QID) | INTRAMUSCULAR | Status: DC | PRN

## 2021-04-14 MED ORDER — LIDOCAINE HCL (PF) 2 % IJ SOLN
INTRAMUSCULAR | Status: AC
  Filled 2021-04-14: qty 5

## 2021-04-14 MED ORDER — JUVEN PO PACK
1.0000 | PACK | Freq: Two times a day (BID) | ORAL | Status: DC
  Administered 2021-04-15 – 2021-04-17 (×4): 1 via ORAL
  Filled 2021-04-14 (×4): qty 1

## 2021-04-14 MED ORDER — TORSEMIDE 20 MG PO TABS
20.0000 mg | ORAL_TABLET | Freq: Every day | ORAL | Status: DC
  Administered 2021-04-14 – 2021-04-17 (×3): 20 mg via ORAL
  Filled 2021-04-14 (×4): qty 1

## 2021-04-14 MED ORDER — DEXAMETHASONE SODIUM PHOSPHATE 10 MG/ML IJ SOLN
INTRAMUSCULAR | Status: AC
  Filled 2021-04-14: qty 1

## 2021-04-14 MED ORDER — VITAMIN D (ERGOCALCIFEROL) 1.25 MG (50000 UNIT) PO CAPS
50000.0000 [IU] | ORAL_CAPSULE | ORAL | Status: DC
  Administered 2021-04-17: 50000 [IU] via ORAL
  Filled 2021-04-14: qty 1

## 2021-04-14 MED ORDER — PHENOL 1.4 % MT LIQD
1.0000 | OROMUCOSAL | Status: DC | PRN
Start: 2021-04-14 — End: 2021-04-17

## 2021-04-14 MED ORDER — DOCUSATE SODIUM 100 MG PO CAPS
100.0000 mg | ORAL_CAPSULE | Freq: Every day | ORAL | Status: DC
  Administered 2021-04-15 – 2021-04-17 (×3): 100 mg via ORAL
  Filled 2021-04-14 (×3): qty 1

## 2021-04-14 MED ORDER — LACTATED RINGERS IV SOLN: INTRAVENOUS | Status: DC

## 2021-04-14 MED ORDER — ATORVASTATIN CALCIUM 10 MG PO TABS
10.0000 mg | ORAL_TABLET | Freq: Every day | ORAL | Status: DC
  Administered 2021-04-15 – 2021-04-17 (×3): 10 mg via ORAL
  Filled 2021-04-14 (×3): qty 1

## 2021-04-14 MED ORDER — ALUM & MAG HYDROXIDE-SIMETH 200-200-20 MG/5ML PO SUSP
15.0000 mL | ORAL | Status: DC | PRN
Start: 2021-04-14 — End: 2021-04-17

## 2021-04-14 MED ORDER — MIDAZOLAM HCL 5 MG/5ML IJ SOLN
INTRAMUSCULAR | Status: DC | PRN
  Administered 2021-04-14: 2 mg via INTRAVENOUS

## 2021-04-14 MED ORDER — ONDANSETRON HCL 4 MG/2ML IJ SOLN
INTRAMUSCULAR | Status: AC
  Filled 2021-04-14: qty 2

## 2021-04-14 MED ORDER — CEFAZOLIN SODIUM-DEXTROSE 2-4 GM/100ML-% IV SOLN
2.0000 g | Freq: Three times a day (TID) | INTRAVENOUS | Status: AC
  Administered 2021-04-14 – 2021-04-15 (×2): 2 g via INTRAVENOUS
  Filled 2021-04-14 (×2): qty 100

## 2021-04-14 MED ORDER — DEXAMETHASONE SODIUM PHOSPHATE 10 MG/ML IJ SOLN
INTRAMUSCULAR | Status: DC | PRN
  Administered 2021-04-14: 4 mg via INTRAVENOUS

## 2021-04-14 MED ORDER — POLYETHYLENE GLYCOL 3350 17 G PO PACK: 17.0000 g | PACK | Freq: Every day | ORAL | Status: DC | PRN

## 2021-04-14 MED ORDER — ZINC SULFATE 220 (50 ZN) MG PO CAPS
220.0000 mg | ORAL_CAPSULE | Freq: Every day | ORAL | Status: DC
  Administered 2021-04-14 – 2021-04-17 (×4): 220 mg via ORAL
  Filled 2021-04-14 (×4): qty 1

## 2021-04-14 MED ORDER — FENTANYL CITRATE (PF) 100 MCG/2ML IJ SOLN
INTRAMUSCULAR | Status: DC | PRN
  Administered 2021-04-14: 50 ug via INTRAVENOUS

## 2021-04-14 MED ORDER — ONDANSETRON HCL 4 MG/2ML IJ SOLN
INTRAMUSCULAR | Status: DC | PRN
  Administered 2021-04-14: 4 mg via INTRAVENOUS

## 2021-04-14 MED ORDER — AMIODARONE HCL 200 MG PO TABS
200.0000 mg | ORAL_TABLET | Freq: Every day | ORAL | Status: DC
  Administered 2021-04-14 – 2021-04-17 (×4): 200 mg via ORAL
  Filled 2021-04-14 (×4): qty 1

## 2021-04-14 MED ORDER — EMPAGLIFLOZIN 10 MG PO TABS
10.0000 mg | ORAL_TABLET | Freq: Every day | ORAL | Status: DC
  Administered 2021-04-15 – 2021-04-17 (×3): 10 mg via ORAL
  Filled 2021-04-14 (×3): qty 1

## 2021-04-14 MED ORDER — CARVEDILOL 3.125 MG PO TABS
3.1250 mg | ORAL_TABLET | Freq: Two times a day (BID) | ORAL | Status: DC
  Administered 2021-04-14 – 2021-04-17 (×5): 3.125 mg via ORAL
  Filled 2021-04-14 (×6): qty 1

## 2021-04-14 MED ORDER — GUAIFENESIN-DM 100-10 MG/5ML PO SYRP: 15.0000 mL | ORAL_SOLUTION | ORAL | Status: DC | PRN

## 2021-04-14 MED ORDER — PHENYLEPHRINE 40 MCG/ML (10ML) SYRINGE FOR IV PUSH (FOR BLOOD PRESSURE SUPPORT)
PREFILLED_SYRINGE | INTRAVENOUS | Status: DC | PRN
  Administered 2021-04-14: 40 ug via INTRAVENOUS
  Administered 2021-04-14 (×3): 80 ug via INTRAVENOUS

## 2021-04-14 MED ORDER — FENTANYL CITRATE (PF) 100 MCG/2ML IJ SOLN
25.0000 ug | INTRAMUSCULAR | Status: DC | PRN
  Administered 2021-04-14 (×2): 50 ug via INTRAVENOUS

## 2021-04-14 MED ORDER — FENTANYL CITRATE (PF) 250 MCG/5ML IJ SOLN
INTRAMUSCULAR | Status: AC
  Filled 2021-04-14: qty 5

## 2021-04-14 MED ORDER — MAGNESIUM CITRATE PO SOLN: 1.0000 | Freq: Once | ORAL | Status: DC | PRN

## 2021-04-14 MED ORDER — INSULIN ASPART 100 UNIT/ML IJ SOLN
0.0000 [IU] | Freq: Three times a day (TID) | INTRAMUSCULAR | Status: DC
  Administered 2021-04-14: 5 [IU] via SUBCUTANEOUS
  Administered 2021-04-15: 2 [IU] via SUBCUTANEOUS
  Administered 2021-04-15: 5 [IU] via SUBCUTANEOUS
  Administered 2021-04-15: 8 [IU] via SUBCUTANEOUS
  Administered 2021-04-16 – 2021-04-17 (×3): 3 [IU] via SUBCUTANEOUS

## 2021-04-14 MED ORDER — DIGOXIN 125 MCG PO TABS
0.1250 mg | ORAL_TABLET | Freq: Every day | ORAL | Status: DC
  Administered 2021-04-14 – 2021-04-17 (×4): 0.125 mg via ORAL
  Filled 2021-04-14 (×4): qty 1

## 2021-04-14 MED ORDER — SPIRONOLACTONE 25 MG PO TABS
25.0000 mg | ORAL_TABLET | Freq: Every day | ORAL | Status: DC
  Administered 2021-04-14 – 2021-04-17 (×4): 25 mg via ORAL
  Filled 2021-04-14 (×4): qty 1

## 2021-04-14 MED ORDER — POTASSIUM CHLORIDE CRYS ER 20 MEQ PO TBCR: 20.0000 meq | EXTENDED_RELEASE_TABLET | Freq: Every day | ORAL | Status: DC | PRN

## 2021-04-14 MED ORDER — HYDROMORPHONE HCL 1 MG/ML IJ SOLN
0.5000 mg | INTRAMUSCULAR | Status: DC | PRN
  Administered 2021-04-14 – 2021-04-15 (×2): 0.5 mg via INTRAVENOUS
  Filled 2021-04-14 (×2): qty 0.5

## 2021-04-14 SURGICAL SUPPLY — 28 items
BLADE SAW SGTL MED 73X18.5 STR (BLADE) ×2 IMPLANT
BLADE SURG 21 STRL SS (BLADE) ×2 IMPLANT
BNDG COHESIVE 4X5 TAN STRL (GAUZE/BANDAGES/DRESSINGS) ×2 IMPLANT
BNDG GAUZE ELAST 4 BULKY (GAUZE/BANDAGES/DRESSINGS) ×2 IMPLANT
COVER SURGICAL LIGHT HANDLE (MISCELLANEOUS) ×4 IMPLANT
COVER WAND RF STERILE (DRAPES) ×2 IMPLANT
DRAPE U-SHAPE 47X51 STRL (DRAPES) ×2 IMPLANT
DRSG ADAPTIC 3X8 NADH LF (GAUZE/BANDAGES/DRESSINGS) ×2 IMPLANT
DRSG PAD ABDOMINAL 8X10 ST (GAUZE/BANDAGES/DRESSINGS) ×2 IMPLANT
DURAPREP 26ML APPLICATOR (WOUND CARE) ×2 IMPLANT
ELECT REM PT RETURN 9FT ADLT (ELECTROSURGICAL) ×2
ELECTRODE REM PT RTRN 9FT ADLT (ELECTROSURGICAL) ×1 IMPLANT
GAUZE SPONGE 4X4 12PLY STRL (GAUZE/BANDAGES/DRESSINGS) ×2 IMPLANT
GLOVE BIOGEL PI IND STRL 9 (GLOVE) ×1 IMPLANT
GLOVE BIOGEL PI INDICATOR 9 (GLOVE) ×1
GLOVE SURG ORTHO 9.0 STRL STRW (GLOVE) ×2 IMPLANT
GOWN STRL REUS W/ TWL XL LVL3 (GOWN DISPOSABLE) ×2 IMPLANT
GOWN STRL REUS W/TWL XL LVL3 (GOWN DISPOSABLE) ×4
KIT BASIN OR (CUSTOM PROCEDURE TRAY) ×2 IMPLANT
KIT TURNOVER KIT B (KITS) ×2 IMPLANT
NS IRRIG 1000ML POUR BTL (IV SOLUTION) ×2 IMPLANT
PACK ORTHO EXTREMITY (CUSTOM PROCEDURE TRAY) ×2 IMPLANT
PAD ARMBOARD 7.5X6 YLW CONV (MISCELLANEOUS) ×4 IMPLANT
STOCKINETTE IMPERVIOUS LG (DRAPES) IMPLANT
SUT ETHILON 2 0 PSLX (SUTURE) ×4 IMPLANT
TOWEL GREEN STERILE (TOWEL DISPOSABLE) ×2 IMPLANT
TUBE CONNECTING 12X1/4 (SUCTIONS) ×2 IMPLANT
YANKAUER SUCT BULB TIP NO VENT (SUCTIONS) ×2 IMPLANT

## 2021-04-14 NOTE — Progress Notes (Signed)
Pt receives medication via prepackaged box. He is unsure of what medications he is currently on or what they are for. Not all meds on list are found in box. He has a home health aid that puts some medications in another box at home for him, but he is not sure what these are.

## 2021-04-14 NOTE — Transfer of Care (Signed)
Immediate Anesthesia Transfer of Care Note  Patient: Scott Greer  Procedure(s) Performed: RIGHT 1ST AND 2ND RAY AMPUTATION (Right: Foot)  Patient Location: PACU  Anesthesia Type:General  Level of Consciousness: drowsy  Airway & Oxygen Therapy: Patient Spontanous Breathing and Patient connected to face mask oxygen  Post-op Assessment: Report given to RN and Post -op Vital signs reviewed and stable  Post vital signs: Reviewed and stable  Last Vitals:  Vitals Value Taken Time  BP 123/82 04/14/21 1506  Temp    Pulse 77 04/14/21 1507  Resp 16 04/14/21 1507  SpO2 100 % 04/14/21 1507  Vitals shown include unvalidated device data.  Last Pain:  Vitals:   04/14/21 1251  TempSrc:   PainSc: 9       Patients Stated Pain Goal: 0 (04/14/21 1251)  Complications: No notable events documented.

## 2021-04-14 NOTE — Op Note (Signed)
04/14/2021  2:55 PM  PATIENT:  Scott Greer    PRE-OPERATIVE DIAGNOSIS:  Abscess Right Foot  POST-OPERATIVE DIAGNOSIS:  Same  PROCEDURE:  RIGHT 1ST AND 2ND RAY AMPUTATION Local tissue rearrangement for wound closure 10 x 5 cm.  SURGEON:  [name], MD  PHYSICIAN ASSISTANT:None ANESTHESIA:   General  PREOPERATIVE INDICATIONS:  Scott Greer is a  65 y.o. male with a diagnosis of Abscess Right Foot who failed conservative measures and elected for surgical management.    The risks benefits and alternatives were discussed with the patient preoperatively including but not limited to the risks of infection, bleeding, nerve injury, cardiopulmonary complications, the need for revision surgery, among others, and the patient was willing to proceed.  OPERATIVE IMPLANTS: none  @ENCIMAGES @  OPERATIVE FINDINGS: Necrotic tissue surrounding the first and second metatarsal heads.  This was resected.  Tissue margins were clear with good petechial bleeding.  OPERATIVE PROCEDURE: Patient brought the operating room and underwent a general anesthetic.  After adequate levels anesthesia were obtained patient's right lower extremity was prepped using DuraPrep draped into a sterile field a timeout was called.  A racquet incision was made around the ulcerative tissue of the first metatarsal and second toe.  This was resected back to healthy viable margins.  The first ray was resected through the base of the first metatarsal and the second metatarsal was resected through the proximal shaft.  Electrocautery was used hemostasis.  The wound was irrigated with normal saline.  The wound remaining was 10 x 5 cm.  Local tissue rearrangement was used to close the wound 10 x 5 cm with 2-0 nylon.  A sterile dressing was applied patient was extubated taken the PACU in stable condition.  Debridement type: Excisional Debridement  Side: right  Body Location: right foot   Tools used for debridement: scalpel and  rongeur  Pre-debridement Wound size (cm):   Length: 1        Width: 1     Depth: 1   Post-debridement Wound size (cm):   Length: 10        Width: 5     Depth: 3   Debridement depth beyond dead/damaged tissue down to healthy viable tissue: yes  Tissue layer involved: skin, subcutaneous tissue, muscle / fascia, bone  Nature of tissue removed: Slough, Necrotic, Devitalized Tissue, Non-viable tissue, and Purulence  Irrigation volume: 1 liter     Irrigation fluid type: Normal Saline     DISCHARGE PLANNING:  Antibiotic duration: 24 hours antibiotics  Weightbearing: Touchdown weightbearing on the right  Pain medication: Opioid pathway  Dressing care/ Wound VAC: Reinforce dressing as needed  Ambulatory devices: Walker  Discharge to: Anticipate discharge to home after 23 hours of observation.  Follow-up: In the office 1 week post operative.

## 2021-04-14 NOTE — Progress Notes (Signed)
Pharmacy Antibiotic Note  Scott Greer is a 65 y.o. male admitted on 04/14/2021 with  osteomyelitis .  Pharmacy has been consulted for cefazolin dosing.  Plan: Cefazolin 2g IV q8h -monitor renal function, clinical status and overall abx plan  Height: 6\' 3"  (190.5 cm) Weight: 90.7 kg (200 lb) IBW/kg (Calculated) : 84.5  Temp (24hrs), Avg:97.8 F (36.6 C), Min:97.5 F (36.4 C), Max:98.6 F (37 C)  No results for input(s): WBC, CREATININE, LATICACIDVEN, VANCOTROUGH, VANCOPEAK, VANCORANDOM, GENTTROUGH, GENTPEAK, GENTRANDOM, TOBRATROUGH, TOBRAPEAK, TOBRARND, AMIKACINPEAK, AMIKACINTROU, AMIKACIN in the last 168 hours.  Estimated Creatinine Clearance: 92.5 mL/min (by C-G formula based on SCr of 0.99 mg/dL).    Allergies  Allergen Reactions   Bee Venom Hives, Itching and Swelling    Antimicrobials this admission: Cefazolin 6/10 >>  Dose adjustments this admission: N/A  Microbiology results: N/A  Thank you for allowing pharmacy to be a part of this patient's care.  8/10, PharmD, BCCCP Emergency Medicine Clinical Pharmacist  Please check AMION for all [facility] phone numbers After 10:00 PM, call Main Pharmacy [phone]

## 2021-04-14 NOTE — Anesthesia Postprocedure Evaluation (Signed)
Anesthesia Post Note  Patient: Scott Greer  Procedure(s) Performed: RIGHT 1ST AND 2ND RAY AMPUTATION (Right: Foot)     Patient location during evaluation: PACU Anesthesia Type: General Level of consciousness: awake Pain management: pain level controlled Vital Signs Assessment: post-procedure vital signs reviewed and stable Respiratory status: spontaneous breathing Postop Assessment: no apparent nausea or vomiting Anesthetic complications: no   No notable events documented.  Last Vitals:  Vitals:   04/14/21 1505 04/14/21 1520  BP: 123/82 138/86  Pulse: 77 71  Resp: 13 13  Temp: (!) 36.4 C   SpO2: 100% 100%    Last Pain:  Vitals:   04/14/21 1505  TempSrc:   PainSc: 10-Worst pain ever                 Roosevelt Eimers

## 2021-04-14 NOTE — H&P (Addendum)
Scott Greer is an 65 y.o. male.   Chief Complaint: Right Foot Osteomyelitis HPI: Patient is status post  right great toe and second toe amputations a year ago. 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   Glaucoma 2015   Hyperlipidemia    Hypertension 2005   Left foot infection 02/07/2021   Osteomyelitis (HCC)    Wears glasses     Past Surgical History:  Procedure Laterality Date   AMPUTATION Left 06/05/2019   Procedure: LEFT GREAT TOE AMPUTATION;  Surgeon: [name], MD;  Location: [facility] OR;  Service: Orthopedics;  Laterality: Left;   AMPUTATION Left 07/10/2019   Procedure: LEFT FOOT 1ST RAY AMPUTATION;  Surgeon: [name], MD;  Location: [facility] OR;  Service: Orthopedics;  Laterality: Left;   AMPUTATION Right 05/25/2020   Procedure: RIGHT GREAT TOE AMPUTATION;  Surgeon: [name], MD;  Location: [facility] OR;  Service: Orthopedics;  Laterality: Right;   AMPUTATION Left 01/25/2021   Procedure: LEFT 2ND TOE AMPUTATION;  Surgeon: [name], MD;  Location: [facility] OR;  Service: Orthopedics;  Laterality: Left;   AMPUTATION Left 02/08/2021   Procedure: LEFT TRANSMETATARSAL AMPUTATION;  Surgeon: [name], MD;  Location: [facility] OR;  Service: Orthopedics;  Laterality: Left;   AMPUTATION Left 02/10/2021   Procedure: AMPUTATION BELOW KNEE;  Surgeon: [name], MD;  Location: [facility] OR;  Service: Orthopedics;  Laterality: Left;   NO PAST SURGERIES     RIGHT/LEFT HEART CATH AND CORONARY ANGIOGRAPHY N/A 01/02/2021   Procedure: RIGHT/LEFT HEART CATH AND CORONARY ANGIOGRAPHY;  Surgeon: Runell Gess, MD;  Location: [facility] INVASIVE CV LAB;  Service: Cardiovascular;  Laterality: N/A;    Family History  Problem Relation Age of Onset   Stroke Mother    Diabetes Mother         Toward end of life   Social History:  reports that he has been smoking cigars and cigarettes. He has quit using smokeless tobacco.  His smokeless tobacco use included chew. He reports current alcohol use of about 11.0 standard drinks of alcohol per week. He reports current drug use. Drug: Marijuana.  Allergies:  Allergies  Allergen Reactions   Bee Venom Hives, Itching and Swelling    No medications prior to admission.    No results found for this or any previous visit (from the past 48 hour(s)). No results found.  Review of Systems  All other systems reviewed and are negative.  Weight 90.7 kg. Physical Exam  Right Foot : Swelling and redness. He has and ulcer beneath the metatarsal head that probes deeply to bone Surrounding cellulitis.Positive dorsalis pedis pulse Heart RRR Lungs Clear Assessment/Plan Patient was seen by Dr Lajoyce Corners who recommended Right Foot 1st and second ray amputation.Will go forward with this on friday  [name], PA 04/14/2021, 6:39 AM

## 2021-04-14 NOTE — Anesthesia Procedure Notes (Signed)
Procedure Name: LMA Insertion Date/Time: 04/14/2021 2:19 PM Performed by: Pearson Grippe, CRNA Pre-anesthesia Checklist: Patient identified, Emergency Drugs available, Suction available and Patient being monitored Patient Re-evaluated:Patient Re-evaluated prior to induction Oxygen Delivery Method: Circle system utilized Preoxygenation: Pre-oxygenation with 100% oxygen Induction Type: IV induction Ventilation: Mask ventilation without difficulty LMA: LMA inserted LMA Size: 4.0 Number of attempts: 1 Airway Equipment and Method: Bite block Placement Confirmation: positive ETCO2 Tube secured with: Tape Dental Injury: Teeth and Oropharynx as per pre-operative assessment

## 2021-04-14 NOTE — Interval H&P Note (Signed)
History and Physical Interval Note:  04/14/2021 2:52 PM  Scott Greer  has presented today for surgery, with the diagnosis of Abscess Right Foot.  The various methods of treatment have been discussed with the patient and family. After consideration of risks, benefits and other options for treatment, the patient has consented to  Procedure(s): RIGHT 1ST AND 2ND RAY AMPUTATION (Right) as a surgical intervention.  The patient's history has been reviewed, patient examined, no change in status, stable for surgery.  I have reviewed the patient's chart and labs.  Questions were answered to the patient's satisfaction.     [name]

## 2021-04-14 NOTE — Anesthesia Postprocedure Evaluation (Signed)
Anesthesia Post Note  Patient: Scott Greer  Procedure(s) Performed: RIGHT 1ST AND 2ND RAY AMPUTATION (Right: Foot)     Patient location during evaluation: PACU Anesthesia Type: General Level of consciousness: awake Pain management: pain level controlled Vital Signs Assessment: post-procedure vital signs reviewed and stable Respiratory status: spontaneous breathing Cardiovascular status: stable Postop Assessment: no apparent nausea or vomiting Anesthetic complications: no   No notable events documented.  Last Vitals:  Vitals:   04/14/21 1505 04/14/21 1520  BP: 123/82 138/86  Pulse: 77 71  Resp: 13 13  Temp: (!) 36.4 C   SpO2: 100% 100%    Last Pain:  Vitals:   04/14/21 1505  TempSrc:   PainSc: 10-Worst pain ever                 Khaliah Barnick

## 2021-04-14 NOTE — Anesthesia Preprocedure Evaluation (Signed)
Anesthesia Evaluation  Patient identified by MRN, date of birth, ID band Patient awake    Reviewed: Allergy & Precautions, NPO status , Patient's Chart, lab work & pertinent test results  Airway Mallampati: II  TM Distance: >3 FB     Dental   Pulmonary neg pulmonary ROS, Current Smoker and Patient abstained from smoking.,    breath sounds clear to auscultation       Cardiovascular hypertension, +CHF   Rhythm:Regular Rate:Normal     Neuro/Psych    GI/Hepatic negative GI ROS, Neg liver ROS,   Endo/Other  diabetes  Renal/GU Renal disease     Musculoskeletal   Abdominal   Peds  Hematology   Anesthesia Other Findings   Reproductive/Obstetrics                             Anesthesia Physical Anesthesia Plan  ASA: 3  Anesthesia Plan: General   Post-op Pain Management:    Induction: Intravenous  PONV Risk Score and Plan: 2 and Ondansetron, Dexamethasone and Midazolam  Airway Management Planned: LMA  Additional Equipment:   Intra-op Plan:   Post-operative Plan: Extubation in OR  Informed Consent: I have reviewed the patients History and Physical, chart, labs and discussed the procedure including the risks, benefits and alternatives for the proposed anesthesia with the patient or authorized representative who has indicated his/her understanding and acceptance.     Dental advisory given  Plan Discussed with: CRNA and Anesthesiologist  Anesthesia Plan Comments:         Anesthesia Quick Evaluation

## 2021-04-14 NOTE — TOC Progression Note (Signed)
Transition of Care [facility]) - Progression Note    Patient Details  Name: Scott Greer MRN: [id number] Date of Birth: 1956/06/14  [facility] of [facility]) CM/SW Contact  Ralene Bathe, LCSWA Phone Number: 04/14/2021, 5:03 PM  Clinical Narrative:     CSW following patient for any d/c planning needs once medically stable.  Pending PT/OT recommendations.   Cleon Gustin, MSW, LCSWA        Expected Discharge Plan and Services                                                 Social Determinants of Health (SDOH) Interventions    Readmission Risk Interventions Readmission Risk Prevention Plan 06/08/2019  Transportation Screening Complete  PCP or Specialist Appt within 5-7 Days Complete  Home Care Screening Complete  Medication Review (RN CM) Complete  Some recent data might be hidden

## 2021-04-15 ENCOUNTER — Other Ambulatory Visit: Payer: Self-pay

## 2021-04-15 ENCOUNTER — Encounter (HOSPITAL_COMMUNITY): Payer: Self-pay | Admitting: Orthopedic Surgery

## 2021-04-15 LAB — GLUCOSE, CAPILLARY
Glucose-Capillary: 166 mg/dL — ABNORMAL HIGH (ref 70–99)
Glucose-Capillary: 290 mg/dL — ABNORMAL HIGH (ref 70–99)
Glucose-Capillary: 204 mg/dL — ABNORMAL HIGH (ref 70–99)
Glucose-Capillary: 139 mg/dL — ABNORMAL HIGH (ref 70–99)
Glucose-Capillary: 177 mg/dL — ABNORMAL HIGH (ref 70–99)

## 2021-04-15 NOTE — Evaluation (Signed)
Occupational Therapy Evaluation Patient Details Name: Scott Greer MRN: [id number] DOB: 1955/12/06 Today's Date: 04/15/2021    History of Present Illness 65 yo male presenting with R foot osteomyelitis. S/p R great toe and second toe amputation on 6/10. Also has progressive ulceration between 1st and 2nd metatarsal heads. PMH including CHF, L BKA, DM type ll, glaucoma, and HTN.   Clinical Impression   PTA, pt was living alone and was performing BADLs and simple IADLs; mainly using w/c for functional mobility. However, pt's w/c for not fit through his doorframes and bathroom. Pt with PCA who assist him with IADLs and in/out of bathroom. Pt currently performing LB ADLs at Min Guard-Min A level and anterior/posterior transfer with Min Guard A to recliner. Pt presenting with decreased balance and safety awareness. When discussing how pt will access bathroom with maintaining WB status, pt stating "ma'am I've been doing this for so long, I'll figure it out." Pt would benefit from further acute OT to facilitate safe dc. Recommend dc to SNF for further OT to optimize safety, independence with ADLs, and return to PLOF. However, pt reporting he would prefer to go home, and he will need HHOT and increased assistance from PCA.     Follow Up Recommendations  SNF (Pt declining SNF, and will need HHOT.) Would benefit from increased PCA assistance   Equipment Recommendations  3 in 1 bedside commode;Wheelchair (measurements OT);Wheelchair cushion (measurements OT) (Drop arm 3n1 and drop arm w/c)    Recommendations for Other Services PT consult     Precautions / Restrictions Precautions Precautions: Fall Restrictions Weight Bearing Restrictions: Yes RLE Weight Bearing: Touchdown weight bearing Other Position/Activity Restrictions: post op shoe      Mobility Bed Mobility Overal bed mobility: Modified Independent                  Transfers Overall transfer level: Needs assistance    Transfers: Licensed conveyancer transfers: Min guard   General transfer comment: Min Guard A for safety    Balance Overall balance assessment: Needs assistance Sitting-balance support: No upper extremity supported;Feet supported Sitting balance-Leahy Scale: Normal                                     ADL either performed or assessed with clinical judgement   ADL Overall ADL's : Needs assistance/impaired Eating/Feeding: Set up;Sitting   Grooming: Set up;Supervision/safety;Sitting   Upper Body Bathing: Set up;Supervision/ safety;Sitting   Lower Body Bathing: Minimal assistance;Sitting/lateral leans;Bed level   Upper Body Dressing : Supervision/safety;Set up;Sitting   Lower Body Dressing: Bed level;Min guard;Sitting/lateral leans Lower Body Dressing Details (indicate cue type and reason): Pt donning pants at bed level with cues for rolling to raise pants over hips Toilet Transfer: Min guard;Anterior/posterior (simulated to recliner) Statistician Details (indicate cue type and reason): Min Guard A for safety. Pt demonstrating good strength. Cues for maintaining NWB status         Functional mobility during ADLs: Min guard (anterior posterior transfer to recliner) General ADL Comments: Pt demonstrating good strength and motivation to return home.     Vision         Perception     Praxis      Pertinent Vitals/Pain       Hand Dominance Right   Extremity/Trunk Assessment Upper Extremity Assessment Upper Extremity Assessment: Overall WFL for tasks assessed  Lower Extremity Assessment Lower Extremity Assessment: Defer to PT evaluation       Communication Communication Communication: No difficulties   Cognition Arousal/Alertness: Awake/alert Behavior During Therapy: WFL for tasks assessed/performed Overall Cognitive Status: Within Functional Limits for tasks assessed                                  General Comments: Noting slight decreased in insight and safety awareness; benefiting from questions cues to problem solve home situation. Very agreeable   General Comments       Exercises     Shoulder Instructions      Home Living Family/patient expects to be discharged to:: Private residence Living Arrangements: Alone Available Help at Discharge: Personal care attendant (2 hrs/day - in the morning 10-12) Type of Home: Apartment Home Access: Stairs to enter Entrance Stairs-Number of Steps: 1 "to the proch"   Home Layout: One level     Bathroom Shower/Tub: Chief Strategy Officer: Standard     Home Equipment: Cane - single point;Crutches;Wheelchair - [employer] - 2 wheels;Shower seat          Prior Functioning/Environment Level of Independence: Independent with assistive device(s)        Comments: Was doing simple meals from his w/c. PCA assisting with some iADLs and helping with in and out of bathroom. Was driving        OT Problem List: Decreased strength;Decreased range of motion;Decreased activity tolerance;Impaired balance (sitting and/or standing);Decreased knowledge of use of DME or AE;Decreased knowledge of precautions;Pain      OT Treatment/Interventions: Self-care/ADL training;Therapeutic exercise;Energy conservation;DME and/or AE instruction;Therapeutic activities;Patient/family education    OT Goals(Current goals can be found in the care plan section) Acute Rehab OT Goals Patient Stated Goal: Go home Monday OT Goal Formulation: With patient Time For Goal Achievement: 04/29/21 Potential to Achieve Goals: Good  OT Frequency: Min 3X/week   Barriers to D/C:            Co-evaluation              AM-PAC OT "6 Clicks" Daily Activity     Outcome Measure Help from another person eating meals?: A Little Help from another person taking care of personal grooming?: A Little Help from another person toileting, which includes using  toliet, bedpan, or urinal?: A Little Help from another person bathing (including washing, rinsing, drying)?: A Little Help from another person to put on and taking off regular upper body clothing?: A Little Help from another person to put on and taking off regular lower body clothing?: A Little 6 Click Score: 18   End of Session Nurse Communication: Mobility status  Activity Tolerance: Patient tolerated treatment well Patient left: in chair;with call bell/phone within reach;with chair alarm set  OT Visit Diagnosis: Unsteadiness on feet (R26.81);Other abnormalities of gait and mobility (R26.89);Muscle weakness (generalized) (M62.81);Pain Pain - Right/Left: Right Pain - part of body: Ankle and joints of foot                Time: 3903-0092 OT Time Calculation (min): 31 min Charges:  OT General Charges $OT Visit: 1 Visit OT Evaluation $OT Eval Moderate Complexity: 1 Mod OT Treatments $Self Care/Home Management : 8-22 mins  Chrisann Melaragno MSOT, OTR/L Acute Rehab Pager: [phone] Office: [phone]  Theodoro Grist Jesse Hirst 04/15/2021, 10:46 AM

## 2021-04-15 NOTE — Progress Notes (Signed)
Orthopedic Tech Progress Note Patient Details:  Scott Greer 1956/09/18 [id number]  Ortho Devices Type of Ortho Device: Postop shoe/boot Ortho Device/Splint Interventions: Ordered      Scott Greer 04/15/2021, 9:57 AM

## 2021-04-15 NOTE — Plan of Care (Signed)
  Problem: Activity: Goal: Ability to avoid complications of mobility impairment will improve Outcome: Progressing Goal: Ability to tolerate increased activity will improve Outcome: Progressing   Problem: Coping: Goal: Level of anxiety will decrease Outcome: Progressing   Problem: Physical Regulation: Goal: Postoperative complications will be avoided or minimized Outcome: Progressing   Problem: Pain Management: Goal: Pain level will decrease Outcome: Progressing

## 2021-04-15 NOTE — Progress Notes (Signed)
Patient ID: Scott Greer, male   DOB: February 01, 1956, 65 y.o.   MRN: [id number] Patient is status post right foot first and second ray amputation.  Patient is anxious to return home we will continue therapy this weekend and anticipate discharge on Monday.  Dressing is clean and dry.

## 2021-04-15 NOTE — Evaluation (Signed)
Physical Therapy Evaluation Patient Details Name: Scott Greer MRN: [id number] DOB: 03/05/1956 Today's Date: 04/15/2021   History of Present Illness  65 yo male presenting with R foot osteomyelitis. S/p R great toe and second toe amputation on 6/10. Also has progressive ulceration between 1st and 2nd metatarsal heads. PMH including CHF, L BKA, DM type ll, glaucoma, and HTN.  Clinical Impression  Pt was seen for mobility on chair to bed transfer, and to work on sitting balance in the chair.  Pt demonstrates a good understanding of AP transfers, but consistently did not seem to understand the danger of going home in an inaccessible situation.  Pt finally agreed to consider a rehab placement, and will be recommended due to steps and lack of  tangible help at home.  Follow for goals of acute PT.    Follow Up Recommendations SNF    Equipment Recommendations  None recommended by PT    Recommendations for Other Services       Precautions / Restrictions Precautions Precautions: Fall Restrictions Weight Bearing Restrictions: Yes RLE Weight Bearing: Non weight bearing      Mobility  Bed Mobility Overal bed mobility: Needs Assistance Bed Mobility: Rolling;Supine to Sit;Sit to Supine Rolling: Supervision   Supine to sit: Supervision Sit to supine: Supervision   General bed mobility comments: discussion about getting into chair and back    Transfers Overall transfer level: Needs assistance Equipment used: None Transfers: Licensed conveyancer transfers: Min guard   General transfer comment: min guard to shift back to bed from recliner at bed  Ambulation/Gait             General Gait Details: unable  Stairs            Wheelchair Mobility    Modified Rankin (Stroke Patients Only)       Balance Overall balance assessment: Needs assistance Sitting-balance support: No upper extremity supported Sitting balance-Leahy Scale:  Good                                       Pertinent Vitals/Pain      Home Living Family/patient expects to be discharged to:: Private residence Living Arrangements: Alone Available Help at Discharge: Personal care attendant Type of Home: Apartment Home Access: Stairs to enter   [employer] of Steps: 1 "to the proch" Home Layout: One level Home Equipment: Cane - single point;Crutches;Wheelchair - [employer] - 2 wheels;Shower seat Additional Comments: Has a wife but they dont live together    Prior Function Level of Independence: Independent with assistive device(s)               Hand Dominance   Dominant Hand: Right    Extremity/Trunk Assessment   Upper Extremity Assessment Upper Extremity Assessment: Defer to OT evaluation    Lower Extremity Assessment Lower Extremity Assessment: Generalized weakness    Cervical / Trunk Assessment Cervical / Trunk Assessment: Normal  Communication   Communication: No difficulties  Cognition Arousal/Alertness: Awake/alert Behavior During Therapy: WFL for tasks assessed/performed Overall Cognitive Status: Within Functional Limits for tasks assessed                                 General Comments: pt is muling questions and statements from PT to reconsider safety and logistics of home alone  General Comments General comments (skin integrity, edema, etc.): pt is up to side of bed from chair with no LOB but PT supervising and maintaining safe path    Exercises     Assessment/Plan    PT Assessment Patient needs continued PT services  PT Problem List Decreased strength;Decreased range of motion;Decreased activity tolerance;Decreased balance;Decreased mobility;Decreased coordination;Decreased safety awareness;Decreased skin integrity;Pain       PT Treatment Interventions DME instruction;Functional mobility training;Therapeutic activities;Therapeutic exercise;Balance  training;Neuromuscular re-education;Patient/family education    PT Goals (Current goals can be found in the Care Plan section)  Acute Rehab PT Goals Patient Stated Goal: Go home Monday PT Goal Formulation: With patient Time For Goal Achievement: 04/29/21 Potential to Achieve Goals: Good    Frequency Min 4X/week   Barriers to discharge Decreased caregiver support;Inaccessible home environment steps to enter house with limited help    Co-evaluation               AM-PAC PT "6 Clicks" Mobility  Outcome Measure Help needed turning from your back to your side while in a flat bed without using bedrails?: A Little Help needed moving from lying on your back to sitting on the side of a flat bed without using bedrails?: A Little Help needed moving to and from a bed to a chair (including a wheelchair)?: A Little Help needed standing up from a chair using your arms (e.g., wheelchair or bedside chair)?: Total Help needed to walk in hospital room?: Total Help needed climbing 3-5 steps with a railing? : Total 6 Click Score: 12    End of Session Equipment Utilized During Treatment: Gait belt Activity Tolerance: Patient tolerated treatment well;Patient limited by fatigue Patient left: in bed;with call bell/phone within reach;with bed alarm set Nurse Communication: Mobility status PT Visit Diagnosis: Muscle weakness (generalized) (M62.81);Difficulty in walking, not elsewhere classified (R26.2)    Time: 0623-7628 PT Time Calculation (min) (ACUTE ONLY): 38 min   Charges:   PT Evaluation $PT Eval Moderate Complexity: 1 Mod PT Treatments $Therapeutic Exercise: 8-22 mins $Therapeutic Activity: 8-22 mins       Ivar Drape 04/15/2021, 9:20 PM Samul Dada, PT MS Acute Rehab Dept. Number: [facility] [id number] and [facility] [phone]

## 2021-04-15 NOTE — Progress Notes (Signed)
Inpatient Rehab Admissions Coordinator Note:   CIR consult received . At this time, Pt. Does not appear to demonstrate medical necessity for CIR admit. I will not purse for CIR admit.  Billiejean Schimek, MS, CCC-SLP Rehab Admissions Coordinator  [phone] (celll) [phone] (office)  

## 2021-04-16 LAB — GLUCOSE, CAPILLARY
Glucose-Capillary: 169 mg/dL — ABNORMAL HIGH (ref 70–99)
Glucose-Capillary: 110 mg/dL — ABNORMAL HIGH (ref 70–99)
Glucose-Capillary: 163 mg/dL — ABNORMAL HIGH (ref 70–99)
Glucose-Capillary: 228 mg/dL — ABNORMAL HIGH (ref 70–99)

## 2021-04-16 NOTE — Plan of Care (Signed)
  Problem: Activity: Goal: Ability to tolerate increased activity will improve Outcome: Progressing   Problem: Education: Goal: Verbalization of understanding the information provided will improve Outcome: Progressing   Problem: Coping: Goal: Level of anxiety will decrease Outcome: Progressing   Problem: Physical Regulation: Goal: Postoperative complications will be avoided or minimized Outcome: Progressing   Problem: Respiratory: Goal: Ability to maintain a clear airway will improve Outcome: Progressing   Problem: Pain Management: Goal: Pain level will decrease Outcome: Progressing   Problem: Skin Integrity: Goal: Signs of wound healing will improve Outcome: Progressing

## 2021-04-16 NOTE — Progress Notes (Signed)
Subjective: 2 Days Post-Op Procedure(s) (LRB): RIGHT 1ST AND 2ND RAY AMPUTATION (Right) Patient reports pain as mild.    Objective: Vital signs in last 24 hours: Temp:  [97.8 F (36.6 C)-98 F (36.7 C)] 97.8 F (36.6 C) (06/11 1926) Pulse Rate:  [66-72] 72 (06/11 1926) Resp:  [18] 18 (06/11 1926) BP: (106-113)/(69-74) 113/69 (06/11 1926) SpO2:  [100 %] 100 % (06/11 1926)  Intake/Output from previous day: 06/11 0701 - 06/12 0700 In: 1867.8 [P.O.:840; I.V.:1027.8] Out: 1850 [Urine:1850] Intake/Output this shift: No intake/output data recorded.  No results for input(s): HGB in the last 72 hours. No results for input(s): WBC, RBC, HCT, PLT in the last 72 hours. No results for input(s): NA, K, CL, CO2, BUN, CREATININE, GLUCOSE, CALCIUM in the last 72 hours. No results for input(s): LABPT, INR in the last 72 hours.  Neurologically intact Bandage in place c/d/i  Assessment/Plan: 2 Days Post-Op Procedure(s) (LRB): RIGHT 1ST AND 2ND RAY AMPUTATION (Right) Up with therapy Plan for discharge tomorrow home with HHPT NWB RLE Continue dressing changes daily      Scott Greer 04/16/2021, 8:39 AM

## 2021-04-17 DIAGNOSIS — I96 Gangrene, not elsewhere classified: Secondary | ICD-10-CM | POA: Diagnosis not present

## 2021-04-17 DIAGNOSIS — L02612 Cutaneous abscess of left foot: Secondary | ICD-10-CM | POA: Diagnosis not present

## 2021-04-17 DIAGNOSIS — L089 Local infection of the skin and subcutaneous tissue, unspecified: Secondary | ICD-10-CM | POA: Diagnosis not present

## 2021-04-17 DIAGNOSIS — T8130XA Disruption of wound, unspecified, initial encounter: Secondary | ICD-10-CM | POA: Diagnosis not present

## 2021-04-17 LAB — HEMOGLOBIN A1C
Hgb A1c MFr Bld: 6.7 % — ABNORMAL HIGH (ref 4.8–5.6)
Mean Plasma Glucose: 146 mg/dL

## 2021-04-17 LAB — GLUCOSE, CAPILLARY: Glucose-Capillary: 167 mg/dL — ABNORMAL HIGH (ref 70–99)

## 2021-04-17 MED ORDER — ZINC SULFATE 220 (50 ZN) MG PO CAPS: 220.0000 mg | ORAL_CAPSULE | Freq: Every day | ORAL | Status: DC

## 2021-04-17 MED ORDER — OXYCODONE HCL 5 MG PO TABS: 5.0000 mg | ORAL_TABLET | ORAL | 0 refills | Status: DC | PRN

## 2021-04-17 MED ORDER — JUVEN PO PACK: 1.0000 | PACK | Freq: Two times a day (BID) | ORAL | 0 refills | Status: DC

## 2021-04-17 NOTE — Plan of Care (Signed)
  Problem: Activity: Goal: Ability to avoid complications of mobility impairment will improve Outcome: Completed/Met Goal: Ability to tolerate increased activity will improve Outcome: Completed/Met   Problem: Education: Goal: Verbalization of understanding the information provided will improve Outcome: Completed/Met   Problem: Coping: Goal: Level of anxiety will decrease Outcome: Completed/Met   Problem: Physical Regulation: Goal: Postoperative complications will be avoided or minimized Outcome: Completed/Met   Problem: Respiratory: Goal: Ability to maintain a clear airway will improve Outcome: Completed/Met   Problem: Pain Management: Goal: Pain level will decrease Outcome: Completed/Met   Problem: Skin Integrity: Goal: Signs of wound healing will improve Outcome: Completed/Met   Problem: Tissue Perfusion: Goal: Ability to maintain adequate tissue perfusion will improve Outcome: Completed/Met

## 2021-04-17 NOTE — Progress Notes (Signed)
Discharge home. Home discharge instruction given, no question verbalized. 

## 2021-04-17 NOTE — TOC Transition Note (Signed)
Transition of Care [facility]) - CM/SW Discharge Note   Patient Details  Name: Scott Greer MRN: [id number] Date of Birth: Sep 21, 1956  [facility] CM/SW Contact:  Ralene Bathe, LCSWA Phone Number: 04/17/2021, 11:25 AM   Clinical Narrative:    Patient will DC to: Home with self care Anticipated DC date: 04/17/2021 Transport by: home health aide   Per MD patient ready for DC to home with self care.  The PT and OT recommendations are for SNF, but the patient reports having an aide who comes to the home who can assist and wants to go home.  The patient is anxiously awaiting discharge reporting that the person who is giving him a ride home (his aide) will not be available after noon.  CSW verified the patient's address, phone number, and PCP. There were no concerns with medication affordability.  The patient reports having all needed DME from previous BKA.  CSW will sign off for now as social work intervention is no longer needed. Please consult [place] again if new needs arise.     Final next level of care: Home/Self Care Barriers to Discharge: No Barriers Identified   Patient Goals and CMS Choice Patient states their goals for this hospitalization and ongoing recovery are:: To go home      Discharge Placement                       Discharge Plan and Services                                     Social Determinants of Health (SDOH) Interventions     Readmission Risk Interventions Readmission Risk Prevention Plan 06/08/2019  Transportation Screening Complete  PCP or Specialist Appt within 5-7 Days Complete  Home Care Screening Complete  Medication Review (RN CM) Complete  Some recent data might be hidden

## 2021-04-17 NOTE — Progress Notes (Signed)
Patient is postop day 3 status post first second ray amputation.  He is requesting to be discharged today   Vital signs stable patient will be discharged on pain medication she will follow-up in our office in 1 week or sooner if concerns dressings intact.  Small amount of bloody drainage swelling is well controlled

## 2021-04-17 NOTE — Plan of Care (Signed)
  Problem: Activity: Goal: Ability to avoid complications of mobility impairment will improve Outcome: Progressing   Problem: Coping: Goal: Level of anxiety will decrease Outcome: Progressing   Problem: Respiratory: Goal: Ability to maintain a clear airway will improve Outcome: Progressing   Problem: Pain Management: Goal: Pain level will decrease Outcome: Progressing   Problem: Skin Integrity: Goal: Signs of wound healing will improve Outcome: Progressing

## 2021-04-17 NOTE — Progress Notes (Signed)
OT Cancellation Note  Patient Details Name: Scott Greer MRN: [id number] DOB: 1956-02-06   Cancelled Treatment:    Reason Eval/Treat Not Completed: Patient declined, no reason specified (Upon arrival, pt dressed and waitnig for d/c; he refused to participate in therapy prior to d/c. Attempted home education safety, pt unaccepting.)  Terina Mcelhinny A Yuval Rubens 04/17/2021, 11:30 AM

## 2021-04-17 NOTE — Discharge Summary (Signed)
Discharge Diagnoses:  Active Problems:   Subacute osteomyelitis, right ankle and foot (HCC)   Foot pain, right   Surgeries: Procedure(s): RIGHT 1ST AND 2ND RAY AMPUTATION on 04/14/2021    Consultants:   Discharged Condition: Improved  Hospital Course: Scott Greer is an 65 y.o. male who was admitted 04/14/2021 with a chief complaint of right foot infection, with a final diagnosis of Abscess Right Foot.  Patient was brought to the operating room on 04/14/2021 and underwent Procedure(s): RIGHT 1ST AND 2ND RAY AMPUTATION.    Patient was given perioperative antibiotics:  Anti-infectives (From admission, onward)    Start     Dose/Rate Route Frequency Ordered Stop   04/15/21 0600  ceFAZolin (ANCEF) IVPB 2g/100 mL premix        2 g 200 mL/hr over 30 Minutes Intravenous On call to O.R. 04/14/21 1226 04/14/21 1453   04/14/21 2230  ceFAZolin (ANCEF) IVPB 2g/100 mL premix  Status:  Discontinued        2 g 200 mL/hr over 30 Minutes Intravenous Every 8 hours 04/14/21 1656 04/14/21 1701   04/14/21 2200  ceFAZolin (ANCEF) IVPB 2g/100 mL premix        2 g 200 mL/hr over 30 Minutes Intravenous Every 8 hours 04/14/21 1659 04/15/21 0554     .  Patient was given sequential compression devices, early ambulation, and aspirin for DVT prophylaxis.  Recent vital signs: Patient Vitals for the past 24 hrs:  BP Temp Temp src Pulse Resp SpO2  04/17/21 0619 105/76 99.1 F (37.3 C) Oral 85 19 100 %  04/16/21 1935 (!) 90/56 98.4 F (36.9 C) -- 85 20 98 %  04/16/21 1734 127/76 -- -- 81 -- --  04/16/21 1356 107/69 98.7 F (37.1 C) Oral 92 16 100 %  04/16/21 0923 128/79 98.6 F (37 C) Oral 78 16 100 %  .  Recent laboratory studies: No results found.  Discharge Medications:   Allergies as of 04/17/2021       Reactions   Bee Venom Hives, Itching, Swelling        Medication List     STOP taking these medications    oxyCODONE-acetaminophen 5-325 MG tablet Commonly known as:  PERCOCET/ROXICET       TAKE these medications    carvedilol 3.125 MG tablet Commonly known as: COREG Take 1 tablet (3.125 mg total) by mouth 2 (two) times daily with a meal.   ferrous sulfate 325 (65 FE) MG tablet Take 1 tablet (325 mg total) by mouth daily with breakfast.   isosorbide-hydrALAZINE 20-37.5 MG tablet Commonly known as: BIDIL Take 1 tablet by mouth 3 (three) times daily with meals.   metFORMIN 500 MG tablet Commonly known as: Glucophage Take 1 tablet (500 mg total) by mouth 2 (two) times daily with a meal.   nutrition supplement (JUVEN) Pack Take 1 packet by mouth 2 (two) times daily between meals.   oxyCODONE 5 MG immediate release tablet Commonly known as: Oxy IR/ROXICODONE Take 1-2 tablets (5-10 mg total) by mouth every 4 (four) hours as needed for moderate pain (pain score 4-6).   torsemide 20 MG tablet Commonly known as: DEMADEX Take 1 tablet (20 mg total) by mouth daily.   True Metrix Blood Glucose Test test strip Generic drug: glucose blood Use as instructed   True Metrix Meter w/Device Kit Use as directed   TRUEplus Lancets 28G Misc Use as directed   zinc sulfate 220 (50 Zn) MG capsule Take 1 capsule (220 mg  total) by mouth daily.       ASK your doctor about these medications    amiodarone 200 MG tablet Commonly known as: PACERONE Take 1 tablet (200 mg total) by mouth daily.   aspirin 81 MG chewable tablet Chew 1 tablet (81 mg total) by mouth daily.   atorvastatin 10 MG tablet Commonly known as: Lipitor Take 1 tablet (10 mg total) by mouth daily.   digoxin 0.125 MG tablet Commonly known as: LANOXIN Take 1 tablet (0.125 mg total) by mouth daily.   empagliflozin 10 MG Tabs tablet Commonly known as: JARDIANCE Take 1 tablet (10 mg total) by mouth daily.   gabapentin 300 MG capsule Commonly known as: NEURONTIN TAKE ONE CAPSULE BY MOUTH THREE TIMES DAILY   potassium chloride SA 20 MEQ tablet Commonly known as: KLOR-CON TAKE 1  TABLET (20 MEQ) BY MOUTH DAILY.   sacubitril-valsartan 24-26 MG Commonly known as: ENTRESTO Take 1 tablet by mouth 2 (two) times daily.   spironolactone 25 MG tablet Commonly known as: ALDACTONE TAKE 1 TABLET (25 MG TOTAL) BY MOUTH DAILY.   Vitamin D (Ergocalciferol) 1.25 MG (50000 UNIT) Caps capsule Commonly known as: DRISDOL Take 1 capsule (50,000 Units total) by mouth every 7 (seven) days.        Diagnostic Studies: No results found.  Patient benefited maximally from their hospital stay and there were no complications.     Disposition: Discharge disposition: 01-Home or Self Care      Discharge Instructions     Call MD / Call 911   Complete by: As directed    If you experience chest pain or shortness of breath, CALL 911 and be transported to the hospital emergency room.  If you develope a fever above 101 F, pus (white drainage) or increased drainage or redness at the wound, or calf pain, call your surgeon's office.   Constipation Prevention   Complete by: As directed    Drink plenty of fluids.  Prune juice may be helpful.  You may use a stool softener, such as Colace (over the counter) 100 mg twice a day.  Use MiraLax (over the counter) for constipation as needed.   Diet - low sodium heart healthy   Complete by: As directed    Discharge instructions   Complete by: As directed    Keep dressing dry and in place.elevate leg   Increase activity slowly as tolerated   Complete by: As directed    Post-operative opioid taper instructions:   Complete by: As directed    POST-OPERATIVE OPIOID TAPER INSTRUCTIONS: It is important to wean off of your opioid medication as soon as possible. If you do not need pain medication after your surgery it is ok to stop day one. Opioids include: Codeine, Hydrocodone(Norco, Vicodin), Oxycodone(Percocet, oxycontin) and hydromorphone amongst others.  Long term and even short term use of opiods can cause: Increased pain  response Dependence Constipation Depression Respiratory depression And more.  Withdrawal symptoms can include Flu like symptoms Nausea, vomiting And more Techniques to manage these symptoms Hydrate well Eat regular healthy meals Stay active Use relaxation techniques(deep breathing, meditating, yoga) Do Not substitute Alcohol to help with tapering If you have been on opioids for less than two weeks and do not have pain than it is ok to stop all together.  Plan to wean off of opioids This plan should start within one week post op of your joint replacement. Maintain the same interval or time between taking each dose and first decrease  the dose.  Cut the total daily intake of opioids by one tablet each day Next start to increase the time between doses. The last dose that should be eliminated is the evening dose.          Follow-up Information     [name], NP Follow up in 1 week(s).   Specialty: Orthopedic Surgery Contact information: [address] [place] [address] [phone]                  Signed: [name] Anden Bartolo 04/17/2021, 7:48 AM

## 2021-04-18 ENCOUNTER — Telehealth: Payer: Self-pay

## 2021-04-18 NOTE — Telephone Encounter (Signed)
Transition Care Management Unsuccessful Follow-up Telephone Call  Date of discharge and from where:  04/17/2021, [facility]  Attempts:  1st Attempt  Reason for unsuccessful TCM follow-up call:  Left voice message on # [phone]. Call back requested to this CM  Call also placed to # [phone], voicemail full for Tawana.

## 2021-04-19 ENCOUNTER — Telehealth (HOSPITAL_COMMUNITY): Payer: Self-pay

## 2021-04-19 ENCOUNTER — Telehealth: Payer: Self-pay

## 2021-04-19 DIAGNOSIS — E1152 Type 2 diabetes mellitus with diabetic peripheral angiopathy with gangrene: Secondary | ICD-10-CM | POA: Diagnosis not present

## 2021-04-19 NOTE — Telephone Encounter (Signed)
Transition Care Management Unsuccessful Follow-up Telephone Call  Date of discharge and from where:  04/17/2021, [facility]  Attempts:  2nd Attempt  Reason for unsuccessful TCM follow-up call:  Left voice message on # [phone].  Call back requested to this CM.  Call also placed to # [phone], voicemail full for [name], his sister.

## 2021-04-19 NOTE — Telephone Encounter (Signed)
Attempted to reach Scott Greer for home visit, unsuccessful. Will discuss non compliance with HF clinic team.

## 2021-04-20 ENCOUNTER — Telehealth: Payer: Self-pay

## 2021-04-20 ENCOUNTER — Other Ambulatory Visit: Payer: Self-pay | Admitting: *Deleted

## 2021-04-20 DIAGNOSIS — E1152 Type 2 diabetes mellitus with diabetic peripheral angiopathy with gangrene: Secondary | ICD-10-CM | POA: Diagnosis not present

## 2021-04-20 NOTE — Patient Outreach (Signed)
Care Coordination  04/20/2021  SANTOSH PETTER 1956/10/27 [id number]   Medicaid Managed Care   Unsuccessful Outreach Note  04/20/2021 Name: GRADY MOHABIR MRN: [id number] DOB: December 28, 1955  Referred by: [name], MD Reason for referral : High Risk Managed Medicaid (Unsuccessful RNCM follow up outreach)   An unsuccessful telephone outreach was attempted today. The patient was referred to the case management team for assistance with care management and care coordination.   Follow Up Plan: A HIPAA compliant phone message was left for the patient providing contact information and requesting a return call.  The care management team will reach out to the patient again over the next 7 days.   Estanislado Emms RN, BSN [place]  [employer]

## 2021-04-20 NOTE — Telephone Encounter (Signed)
Transition Care Management Unsuccessful Follow-up Telephone Call  Date of discharge and from where:  04/17/2021, [facility]  Attempts:  3rd Attempt  Reason for unsuccessful TCM follow-up call:  Left voice message on # [phone].    Call also placed to # [phone], voicemail full for [name], his sister.  Letter sent to patient requesting he call this office to schedule a follow up appointment.

## 2021-04-20 NOTE — Patient Instructions (Signed)
Visit Information  [name]  - as a part of your Medicaid benefit, you are eligible for care management and care coordination services at no cost or copay. I was unable to reach you by phone today but would be happy to help you with your health related needs. Please feel free to call me @ [phone] or to reschedule, call Victorino Dike @ [phone].   A member of the Managed Medicaid care management team will reach out to you again over the next 7 days.   Estanislado Emms RN, BSN [place]  [employer]

## 2021-04-21 ENCOUNTER — Ambulatory Visit (INDEPENDENT_AMBULATORY_CARE_PROVIDER_SITE_OTHER): Payer: Medicaid Other | Admitting: Physician Assistant

## 2021-04-21 ENCOUNTER — Encounter: Payer: Self-pay | Admitting: Physician Assistant

## 2021-04-21 ENCOUNTER — Telehealth: Payer: Self-pay

## 2021-04-21 DIAGNOSIS — M86272 Subacute osteomyelitis, left ankle and foot: Secondary | ICD-10-CM

## 2021-04-21 MED ORDER — OXYCODONE HCL 5 MG PO TABS: 5.0000 mg | ORAL_TABLET | ORAL | 0 refills | Status: DC | PRN

## 2021-04-21 MED ORDER — DOXYCYCLINE HYCLATE 100 MG PO TABS: 100.0000 mg | ORAL_TABLET | Freq: Two times a day (BID) | ORAL | 0 refills | Status: DC

## 2021-04-21 MED ORDER — NITROGLYCERIN 0.2 MG/HR TD PT24: 0.2000 mg | MEDICATED_PATCH | Freq: Every day | TRANSDERMAL | 12 refills | Status: DC

## 2021-04-21 MED ORDER — PENTOXIFYLLINE ER 400 MG PO TBCR: 400.0000 mg | EXTENDED_RELEASE_TABLET | Freq: Three times a day (TID) | ORAL | 3 refills | Status: DC

## 2021-04-21 NOTE — Progress Notes (Signed)
Office Visit Note   Patient: Scott Greer           Date of Birth: [birth year]           MRN: [id number] Visit Date: 04/21/2021              Requested by: [name], MD [address] [place],  [place] [address] PCP: [name], MD  No chief complaint on file.     HPI: Patient presents today 1 week status post first and second ray amputations.  He is very unhappy and states that he has a lot of pain and that his foot just does not look good.  He would like to get stronger pain medicine and is suggesting 30 mg of oxycodone.  He is angry that his foot has not gotten better.  He admits to smoking 5 cigars weekly.He wants to know why dr Lajoyce Corners wnt see hi  Assessment & Plan: Visit Diagnoses: No diagnosis found.  Plan: I do have concern for gangrenous changes in the third toe.  I will place him on antibiotics as well as start him on a nitroglycerin patch and some Trental.  He will follow-up next week with Dr. Lajoyce Corners to discuss his concerns  Should begin daily dressing changes I explained to him his issues are more than likely secondary to microcirculation secondary to his poorly controlled diabetes and his nicotine use  Follow-Up Instructions: No follow-ups on file.   Ortho Exam  Patient is alert, oriented, no adenopathy, well-dressed, normal affect, normal respiratory effort. He has some gaping in the wound with areas of necrosis.  Swelling is overall well controlled but he does have gangrenous changes to the third toe.  He does have a biphasic dorsalis pedis pulse.  Imaging: No results found. No images are attached to the encounter.  Labs: Lab Results  Component Value Date   HGBA1C 6.7 (H) 04/15/2021   HGBA1C 9.1 (H) 02/08/2021   HGBA1C 8.6 (H) 02/07/2021   ESRSEDRATE 78 (H) 06/04/2019   CRP 4.5 (H) 06/04/2019   REPTSTATUS 02/11/2021 FINAL 02/06/2021   REPTSTATUS 02/11/2021 FINAL 02/06/2021   CULT  02/06/2021    NO GROWTH 5 DAYS Performed at [facility], [address], [place], [place] [address]    CULT  02/06/2021    NO GROWTH 5 DAYS Performed at [facility], [address], [place], [place] [address]      Lab Results  Component Value Date   ALBUMIN 3.8 04/07/2021   ALBUMIN 2.3 (L) 02/07/2021   ALBUMIN 2.5 (L) 02/06/2021   PREALBUMIN 13.8 (L) 06/04/2019    Lab Results  Component Value Date   MG 1.8 02/07/2021   MG 2.0 01/05/2021   MG 2.1 01/04/2021   Lab Results  Component Value Date   VD25OH 9.2 (L) 12/14/2020    Lab Results  Component Value Date   PREALBUMIN 13.8 (L) 06/04/2019   CBC EXTENDED Latest Ref Rng & Units 04/07/2021 02/15/2021 02/14/2021  WBC 3.4 - 10.8 x10E3/uL 10.9(H) 10.7(H) 10.3  RBC 4.14 - 5.80 x10E6/uL 3.35(L) 3.21(L) 3.30(L)  HGB 13.0 - 17.7 g/dL 0.3(J) 0.0(X) 8.9(L)  HCT 37.5 - 51.0 % 28.1(L) 26.9(L) 27.6(L)  PLT 150 - 450 x10E3/uL 440 488(H) 485(H)  NEUTROABS 1.7 - 7.7 K/uL - - -  LYMPHSABS 0.7 - 4.0 K/uL - - -     There is no height or weight on file to calculate BMI.  Orders:  No orders  of the defined types were placed in this encounter.  No orders of the defined types were placed in this encounter.    Procedures: No procedures performed  Clinical Data: No additional findings.  ROS:  All other systems negative, except as noted in the HPI. Review of Systems  Objective: Vital Signs: There were no vitals taken for this visit.  Specialty Comments:  No specialty comments available.  PMFS History: Patient Active Problem List   Diagnosis Date Noted   Foot pain, right 04/14/2021   Subacute osteomyelitis, right ankle and foot (HCC)    Status post below-knee amputation (HCC) 02/17/2021   Wound dehiscence    Gangrene of toe of left foot (HCC)    Cutaneous abscess of left foot    Left foot infection 02/07/2021   Leukocytosis 02/07/2021   Thrombocytosis 02/07/2021   Hyponatremia 02/07/2021   AKI (acute kidney injury) (HCC) 02/07/2021   Hyperglycemia due to  diabetes mellitus (HCC) 02/07/2021   Hypoalbuminemia due to protein-calorie malnutrition (HCC) 02/07/2021   Osteomyelitis of second toe of left foot (HCC)    New onset of congestive heart failure (HCC) 12/26/2020   CKD (chronic kidney disease) stage 3, GFR 30-59 ml/min (HCC) 12/26/2020   Acute systolic CHF (congestive heart failure) (HCC) 12/26/2020   Anemia, chronic disease 06/22/2020   Amputee, great toe, right (HCC) 06/20/2020   Normocytic anemia 06/20/2020   Erectile dysfunction associated with type 2 diabetes mellitus (HCC) 06/20/2020   Positive for macroalbuminuria 10/24/2019   Amputation of left great toe (HCC) 10/23/2019   Tobacco dependence 10/23/2019   Osteomyelitis of great toe of right foot (HCC) 06/04/2019   Diabetic foot infection (HCC)    Noncompliance    Glaucoma    Hyperlipidemia    Essential hypertension 11/06/2003   Diabetes mellitus (HCC) 11/05/1997   Past Medical History:  Diagnosis Date   CHF (congestive heart failure) (HCC)    Dehiscence of amputation stump (HCC)    left great toe   Diabetes mellitus without complication (HCC) 1999   Type II   Glaucoma 2015   Hyperlipidemia    Hypertension 2005   Left foot infection 02/07/2021   Osteomyelitis (HCC)    Wears glasses     Family History  Problem Relation Age of Onset   Stroke Mother    Diabetes Mother        Toward end of life    Past Surgical History:  Procedure Laterality Date   AMPUTATION Left 06/05/2019   Procedure: LEFT GREAT TOE AMPUTATION;  Surgeon: [name], MD;  [facility];  Service: Orthopedics;  Laterality: Left;   AMPUTATION Left 07/10/2019   Procedure: LEFT FOOT 1ST RAY AMPUTATION;  Surgeon: [name], MD;  Location: [facility] OR;  Service: Orthopedics;  Laterality: Left;   AMPUTATION Right 05/25/2020   Procedure: RIGHT GREAT TOE AMPUTATION;  Surgeon: [name], MD;  Location: [facility] OR;  Service: Orthopedics;  Laterality: Right;   AMPUTATION Left 01/25/2021   Procedure: LEFT 2ND  TOE AMPUTATION;  Surgeon: [name], MD;  Location: [facility] OR;  Service: Orthopedics;  Laterality: Left;   AMPUTATION Left 02/08/2021   Procedure: LEFT TRANSMETATARSAL AMPUTATION;  Surgeon: [name], MD;  Location: [facility] OR;  Service: Orthopedics;  Laterality: Left;   AMPUTATION Left 02/10/2021   Procedure: AMPUTATION BELOW KNEE;  Surgeon: [name], MD;  Location: [facility] OR;  Service: Orthopedics;  Laterality: Left;   AMPUTATION Right 04/14/2021   Procedure: RIGHT 1ST AND 2ND  RAY AMPUTATION;  Surgeon: [name], MD;  Location: [facility] OR;  Service: Orthopedics;  Laterality: Right;   NO PAST SURGERIES     RIGHT/LEFT HEART CATH AND CORONARY ANGIOGRAPHY N/A 01/02/2021   Procedure: RIGHT/LEFT HEART CATH AND CORONARY ANGIOGRAPHY;  Surgeon: Runell Gess, MD;  [facility];  Service: Cardiovascular;  Laterality: N/A;   Social History   Occupational History   Not on file  Tobacco Use   Smoking status: Every Day    Pack years: 0.00    Types: Cigars, Cigarettes   Smokeless tobacco: Former    Types: Chew   Tobacco comments:    6 daily  Vaping Use   Vaping Use: Never used  Substance and Sexual Activity   Alcohol use: Yes    Alcohol/week: 11.0 standard drinks    Types: 3 Cans of beer, 8 Shots of liquor per week    Comment: occasional   Drug use: Yes    Types: Marijuana    Comment: ocassional - last time 05/08/2020   Sexual activity: Not Currently

## 2021-04-21 NOTE — Telephone Encounter (Signed)
Meals on [employer] called to advise that he will reach out to the pt but that they are on a 6 month waiting list due to the back log of new referrals and are maxed out currently. Romeo Apple advised that he would try to reach out to [facility] as well sometimes they have programs that will cover the cost of meal delivery for a compromised pt until they can get on service. Will advise pt when he comes in for his office visit today.

## 2021-04-21 NOTE — Telephone Encounter (Signed)
-----   Message from Rodena Medin, [place] sent at 04/21/2021  9:48 AM EDT ----- Regarding: RE: [employer] [facility] Called and lm on vm for [facility] [phone] senior care advised by reception that it may take some time to call back as they are back logged in calls. Gave info on vm asked for return call. Will hold this message  ----- Message ----- From: Rodena Medin, RMA Sent: 04/19/2021   9:06 AM EDT To: Rodena Medin, RMA Subject: FW: meals on wheels                            [email or web address] emailed how to register pt for [facility] [employer] [facility]. I have tried calling the number listed and was on hold for 17 minutes. Will await email response or try to call again this afternoon  ----- Message ----- From: Rodena Medin, RMA Sent: 04/11/2021   9:19 AM EDT To: Rodena Medin, RMA Subject: meals on wheels                                Call [phone] to register pt for services. Will do this after surgery scheduled.

## 2021-04-22 DIAGNOSIS — E1152 Type 2 diabetes mellitus with diabetic peripheral angiopathy with gangrene: Secondary | ICD-10-CM | POA: Diagnosis not present

## 2021-04-24 ENCOUNTER — Other Ambulatory Visit: Payer: Self-pay | Admitting: Internal Medicine

## 2021-04-24 DIAGNOSIS — E1152 Type 2 diabetes mellitus with diabetic peripheral angiopathy with gangrene: Secondary | ICD-10-CM | POA: Diagnosis not present

## 2021-04-24 DIAGNOSIS — E1121 Type 2 diabetes mellitus with diabetic nephropathy: Secondary | ICD-10-CM

## 2021-04-25 ENCOUNTER — Other Ambulatory Visit: Payer: Self-pay

## 2021-04-25 ENCOUNTER — Encounter: Payer: Self-pay | Admitting: Family

## 2021-04-25 ENCOUNTER — Other Ambulatory Visit: Payer: Self-pay | Admitting: Physician Assistant

## 2021-04-25 ENCOUNTER — Encounter: Payer: Self-pay | Admitting: Orthopedic Surgery

## 2021-04-25 ENCOUNTER — Ambulatory Visit (INDEPENDENT_AMBULATORY_CARE_PROVIDER_SITE_OTHER): Payer: Medicaid Other | Admitting: Orthopedic Surgery

## 2021-04-25 DIAGNOSIS — T8781 Dehiscence of amputation stump: Secondary | ICD-10-CM

## 2021-04-25 NOTE — Progress Notes (Signed)
Office Visit Note   Patient: Scott Greer           Date of Birth: June 11, 1956           MRN: [id number] Visit Date: 04/10/2021              Requested by: [name], MD [address] [place],  [place] [address] PCP: [name], MD  Chief Complaint  Patient presents with   Left Knee - Follow-up      HPI: Patient is a 65 year old gentleman who presents with increasing ulceration right foot.  He is status post a left transtibial amputation and [employer] is currently making his prosthesis.  Patient complains of a necrotic ulcer of the right great toe and second toe.  Assessment & Plan: Visit Diagnoses:  1. S/P BKA (below knee amputation) unilateral, left (HCC)   2. Osteomyelitis of great toe of right foot (HCC)     Plan: We will plan for right first and second ray amputation on Friday.  We will evaluate to see if patient can obtain [employer] anticipate hospital stay of about 3 days.  Follow-Up Instructions: Return in about 3 weeks (around 05/01/2021).   Ortho Exam  Patient is alert, oriented, no adenopathy, well-dressed, normal affect, normal respiratory effort. Examination patient has a stable left transtibial amputation he has a necrotic ulcer of the right great toe and the right first metatarsal head with necrotic ulceration to the second toe as well using the Doppler patient has a strong dorsalis pedis and posterior tibial pulse on the right which is triphasic.  Imaging: No results found. No images are attached to the encounter.  Labs: Lab Results  Component Value Date   HGBA1C 6.7 (H) 04/15/2021   HGBA1C 9.1 (H) 02/08/2021   HGBA1C 8.6 (H) 02/07/2021   ESRSEDRATE 78 (H) 06/04/2019   CRP 4.5 (H) 06/04/2019   REPTSTATUS 02/11/2021 FINAL 02/06/2021   REPTSTATUS 02/11/2021 FINAL 02/06/2021   CULT  02/06/2021    NO GROWTH 5 DAYS Performed at [facility], [address], [place], [place] [address]    CULT  02/06/2021    NO GROWTH 5  DAYS Performed at [name], [address], [place], [place] [address]      Lab Results  Component Value Date   ALBUMIN 3.8 04/07/2021   ALBUMIN 2.3 (L) 02/07/2021   ALBUMIN 2.5 (L) 02/06/2021   PREALBUMIN 13.8 (L) 06/04/2019    Lab Results  Component Value Date   MG 1.8 02/07/2021   MG 2.0 01/05/2021   MG 2.1 01/04/2021   Lab Results  Component Value Date   VD25OH 9.2 (L) 12/14/2020    Lab Results  Component Value Date   PREALBUMIN 13.8 (L) 06/04/2019   CBC EXTENDED Latest Ref Rng & Units 04/07/2021 02/15/2021 02/14/2021  WBC 3.4 - 10.8 x10E3/uL 10.9(H) 10.7(H) 10.3  RBC 4.14 - 5.80 x10E6/uL 3.35(L) 3.21(L) 3.30(L)  HGB 13.0 - 17.7 g/dL 1.9(Q) 2.2(W) 8.9(L)  HCT 37.5 - 51.0 % 28.1(L) 26.9(L) 27.6(L)  PLT 150 - 450 x10E3/uL 440 488(H) 485(H)  NEUTROABS 1.7 - 7.7 K/uL - - -  LYMPHSABS 0.7 - 4.0 K/uL - - -     There is no height or weight on file to calculate BMI.  Orders:  No orders of the defined types were placed in this encounter.  No orders of the defined types were placed in this encounter.    Procedures: No procedures performed  Clinical Data: No additional findings.  ROS:  All other systems negative, except as noted in the HPI. Review of Systems  Objective: Vital Signs: There were no vitals taken for this visit.  Specialty Comments:  No specialty comments available.  PMFS History: Patient Active Problem List   Diagnosis Date Noted   Foot pain, right 04/14/2021   Subacute osteomyelitis, right ankle and foot (HCC)    Status post below-knee amputation (HCC) 02/17/2021   Wound dehiscence    Gangrene of toe of left foot (HCC)    Cutaneous abscess of left foot    Left foot infection 02/07/2021   Leukocytosis 02/07/2021   Thrombocytosis 02/07/2021   Hyponatremia 02/07/2021   AKI (acute kidney injury) (HCC) 02/07/2021   Hyperglycemia due to diabetes mellitus (HCC) 02/07/2021   Hypoalbuminemia due to protein-calorie malnutrition (HCC)  02/07/2021   Osteomyelitis of second toe of left foot (HCC)    New onset of congestive heart failure (HCC) 12/26/2020   CKD (chronic kidney disease) stage 3, GFR 30-59 ml/min (HCC) 12/26/2020   Acute systolic CHF (congestive heart failure) (HCC) 12/26/2020   Anemia, chronic disease 06/22/2020   Amputee, great toe, right (HCC) 06/20/2020   Normocytic anemia 06/20/2020   Erectile dysfunction associated with type 2 diabetes mellitus (HCC) 06/20/2020   Positive for macroalbuminuria 10/24/2019   Amputation of left great toe (HCC) 10/23/2019   Tobacco dependence 10/23/2019   Osteomyelitis of great toe of right foot (HCC) 06/04/2019   Diabetic foot infection (HCC)    Noncompliance    Glaucoma    Hyperlipidemia    Essential hypertension 11/06/2003   Diabetes mellitus (HCC) 11/05/1997   Past Medical History:  Diagnosis Date   CHF (congestive heart failure) (HCC)    Dehiscence of amputation stump (HCC)    left great toe   Diabetes mellitus without complication (HCC) 1999   Type II   Glaucoma 2015   Hyperlipidemia    Hypertension 2005   Left foot infection 02/07/2021   Osteomyelitis (HCC)    Wears glasses     Family History  Problem Relation Age of Onset   Stroke Mother    Diabetes Mother        Toward end of life    Past Surgical History:  Procedure Laterality Date   AMPUTATION Left 06/05/2019   Procedure: LEFT GREAT TOE AMPUTATION;  Surgeon: [name], MD;  Location: [facility] OR;  Service: Orthopedics;  Laterality: Left;   AMPUTATION Left 07/10/2019   Procedure: LEFT FOOT 1ST RAY AMPUTATION;  Surgeon: [name], MD;  Location: [facility] OR;  Service: Orthopedics;  Laterality: Left;   AMPUTATION Right 05/25/2020   Procedure: RIGHT GREAT TOE AMPUTATION;  Surgeon: [name], MD;  Location: [facility] OR;  Service: Orthopedics;  Laterality: Right;   AMPUTATION Left 01/25/2021   Procedure: LEFT 2ND TOE AMPUTATION;  Surgeon: [name], MD;  Location: [facility] OR;  Service: Orthopedics;   Laterality: Left;   AMPUTATION Left 02/08/2021   Procedure: LEFT TRANSMETATARSAL AMPUTATION;  Surgeon: [name], MD;  Location: [facility] OR;  Service: Orthopedics;  Laterality: Left;   AMPUTATION Left 02/10/2021   Procedure: AMPUTATION BELOW KNEE;  Surgeon: [name], MD;  Location: [facility] OR;  Service: Orthopedics;  Laterality: Left;   AMPUTATION Right 04/14/2021   Procedure: RIGHT 1ST AND 2ND RAY AMPUTATION;  Surgeon: [name], MD;  Location: [facility] OR;  Service: Orthopedics;  Laterality: Right;   NO PAST SURGERIES     RIGHT/LEFT HEART  CATH AND CORONARY ANGIOGRAPHY N/A 01/02/2021   Procedure: RIGHT/LEFT HEART CATH AND CORONARY ANGIOGRAPHY;  Surgeon: Runell Gess, MD;  [facility];  Service: Cardiovascular;  Laterality: N/A;   Social History   Occupational History   Not on file  Tobacco Use   Smoking status: Every Day    Pack years: 0.00    Types: Cigars, Cigarettes   Smokeless tobacco: Former    Types: Chew   Tobacco comments:    6 daily  Vaping Use   Vaping Use: Never used  Substance and Sexual Activity   Alcohol use: Yes    Alcohol/week: 11.0 standard drinks    Types: 3 Cans of beer, 8 Shots of liquor per week    Comment: occasional   Drug use: Yes    Types: Marijuana    Comment: ocassional - last time 05/08/2020   Sexual activity: Not Currently

## 2021-04-25 NOTE — Progress Notes (Signed)
Office Visit Note   Patient: Scott Greer           Date of Birth: 1956-07-21           MRN: [id number] Visit Date: 04/25/2021              Requested by: [name], MD [address] [place],  [place] [address] PCP: [name], MD  Chief Complaint  Patient presents with   Right Foot - Routine Post Op    04/14/21 right foot 1st and 2nd ray amputation       HPI: Patient is a 65 year old gentleman who is about 10 days status post right foot first and second ray amputation.  Patient has had progressive wound necrosis.  Patient did have triphasic dopplerable dorsalis pedis and posterior tibial pulses.  I have discussed with the patient that the problem is with his microcirculation.  Assessment & Plan: Visit Diagnoses:  1. Dehiscence of amputation stump (HCC)     Plan: We will plan for a right transtibial amputation.  Patient has a well-healed stable left transtibial amputation and is currently working with [employer] for prosthetic fitting.  Will plan for surgery tomorrow and anticipate discharge to skilled nursing postoperatively.  Patient states he understands and wishes to proceed with surgery as soon as possible.  Follow-Up Instructions: Return in about 2 weeks (around 05/09/2021).   Ortho Exam  Patient is alert, oriented, no adenopathy, well-dressed, normal affect, normal respiratory effort. Examination patient has had progressive wound dehiscence and now has developed dry gangrenous changes to the third toe.  Patient's foot is extremely painful to palpation consistent with progressive ischemic changes.  There is no ascending cellulitis his calf is soft nontender.  Imaging: No results found. No images are attached to the encounter.  Labs: Lab Results  Component Value Date   HGBA1C 6.7 (H) 04/15/2021   HGBA1C 9.1 (H) 02/08/2021   HGBA1C 8.6 (H) 02/07/2021   ESRSEDRATE 78 (H) 06/04/2019   CRP 4.5 (H) 06/04/2019   REPTSTATUS 02/11/2021 FINAL 02/06/2021    REPTSTATUS 02/11/2021 FINAL 02/06/2021   CULT  02/06/2021    NO GROWTH 5 DAYS Performed at [facility], [address] [place], [place] [address]    CULT  02/06/2021    NO GROWTH 5 DAYS Performed at [facility], [address], [place], [place] [address]      Lab Results  Component Value Date   ALBUMIN 3.8 04/07/2021   ALBUMIN 2.3 (L) 02/07/2021   ALBUMIN 2.5 (L) 02/06/2021   PREALBUMIN 13.8 (L) 06/04/2019    Lab Results  Component Value Date   MG 1.8 02/07/2021   MG 2.0 01/05/2021   MG 2.1 01/04/2021   Lab Results  Component Value Date   VD25OH 9.2 (L) 12/14/2020    Lab Results  Component Value Date   PREALBUMIN 13.8 (L) 06/04/2019   CBC EXTENDED Latest Ref Rng & Units 04/07/2021 02/15/2021 02/14/2021  WBC 3.4 - 10.8 x10E3/uL 10.9(H) 10.7(H) 10.3  RBC 4.14 - 5.80 x10E6/uL 3.35(L) 3.21(L) 3.30(L)  HGB 13.0 - 17.7 g/dL 3.9(Q) 3.0(S) 8.9(L)  HCT 37.5 - 51.0 % 28.1(L) 26.9(L) 27.6(L)  PLT 150 - 450 x10E3/uL 440 488(H) 485(H)  NEUTROABS 1.7 - 7.7 K/uL - - -  LYMPHSABS 0.7 - 4.0 K/uL - - -     There is no height or weight on file to calculate BMI.  Orders:  No orders of the defined types were placed in  this encounter.  No orders of the defined types were placed in this encounter.    Procedures: No procedures performed  Clinical Data: No additional findings.  ROS:  All other systems negative, except as noted in the HPI. Review of Systems  Objective: Vital Signs: There were no vitals taken for this visit.  Specialty Comments:  No specialty comments available.  PMFS History: Patient Active Problem List   Diagnosis Date Noted   Foot pain, right 04/14/2021   Subacute osteomyelitis, right ankle and foot (HCC)    Status post below-knee amputation (HCC) 02/17/2021   Wound dehiscence    Gangrene of toe of left foot (HCC)    Cutaneous abscess of left foot    Left foot infection 02/07/2021   Leukocytosis 02/07/2021   Thrombocytosis  02/07/2021   Hyponatremia 02/07/2021   AKI (acute kidney injury) (HCC) 02/07/2021   Hyperglycemia due to diabetes mellitus (HCC) 02/07/2021   Hypoalbuminemia due to protein-calorie malnutrition (HCC) 02/07/2021   Osteomyelitis of second toe of left foot (HCC)    New onset of congestive heart failure (HCC) 12/26/2020   CKD (chronic kidney disease) stage 3, GFR 30-59 ml/min (HCC) 12/26/2020   Acute systolic CHF (congestive heart failure) (HCC) 12/26/2020   Anemia, chronic disease 06/22/2020   Amputee, great toe, right (HCC) 06/20/2020   Normocytic anemia 06/20/2020   Erectile dysfunction associated with type 2 diabetes mellitus (HCC) 06/20/2020   Positive for macroalbuminuria 10/24/2019   Amputation of left great toe (HCC) 10/23/2019   Tobacco dependence 10/23/2019   Osteomyelitis of great toe of right foot (HCC) 06/04/2019   Diabetic foot infection (HCC)    Noncompliance    Glaucoma    Hyperlipidemia    Essential hypertension 11/06/2003   Diabetes mellitus (HCC) 11/05/1997   Past Medical History:  Diagnosis Date   CHF (congestive heart failure) (HCC)    Dehiscence of amputation stump (HCC)    left great toe   Diabetes mellitus without complication (HCC) 1999   Type II   Glaucoma 2015   Hyperlipidemia    Hypertension 2005   Left foot infection 02/07/2021   Osteomyelitis (HCC)    Wears glasses     Family History  Problem Relation Age of Onset   Stroke Mother    Diabetes Mother        Toward end of life    Past Surgical History:  Procedure Laterality Date   AMPUTATION Left 06/05/2019   Procedure: LEFT GREAT TOE AMPUTATION;  Surgeon: [name], MD;  [facility]: [facility] OR;  Service: Orthopedics;  Laterality: Left;   AMPUTATION Left 07/10/2019   Procedure: LEFT FOOT 1ST RAY AMPUTATION;  Surgeon: [name], MD;  Location: [facility] OR;  Service: Orthopedics;  Laterality: Left;   AMPUTATION Right 05/25/2020   Procedure: RIGHT GREAT TOE AMPUTATION;  Surgeon: [name], MD;   Location: [facility] OR;  Service: Orthopedics;  Laterality: Right;   AMPUTATION Left 01/25/2021   Procedure: LEFT 2ND TOE AMPUTATION;  Surgeon: [name], MD;  Location: [facility] OR;  Service: Orthopedics;  Laterality: Left;   AMPUTATION Left 02/08/2021   Procedure: LEFT TRANSMETATARSAL AMPUTATION;  Surgeon: [name], MD;  Location: [facility] OR;  Service: Orthopedics;  Laterality: Left;   AMPUTATION Left 02/10/2021   Procedure: AMPUTATION BELOW KNEE;  Surgeon: [name], MD;  Location: [facility] OR;  Service: Orthopedics;  Laterality: Left;   AMPUTATION Right 04/14/2021   Procedure: RIGHT 1ST AND 2ND RAY AMPUTATION;  Surgeon: [name],  MD;  Location: [facility] OR;  Service: Orthopedics;  Laterality: Right;   NO PAST SURGERIES     RIGHT/LEFT HEART CATH AND CORONARY ANGIOGRAPHY N/A 01/02/2021   Procedure: RIGHT/LEFT HEART CATH AND CORONARY ANGIOGRAPHY;  Surgeon: Runell Gess, MD;  Location: [facility] LAB;  Service: Cardiovascular;  Laterality: N/A;   Social History   Occupational History   Not on file  Tobacco Use   Smoking status: Every Day    Pack years: 0.00    Types: Cigars, Cigarettes   Smokeless tobacco: Former    Types: Chew   Tobacco comments:    6 daily  Vaping Use   Vaping Use: Never used  Substance and Sexual Activity   Alcohol use: Yes    Alcohol/week: 11.0 standard drinks    Types: 3 Cans of beer, 8 Shots of liquor per week    Comment: occasional   Drug use: Yes    Types: Marijuana    Comment: ocassional - last time 05/08/2020   Sexual activity: Not Currently

## 2021-04-26 ENCOUNTER — Inpatient Hospital Stay (HOSPITAL_COMMUNITY)
Admission: RE | Admit: 2021-04-26 | Discharge: 2021-05-02 | DRG: 474 | Disposition: A | Discharge status: Skilled Nursing Facility | Payer: Medicaid Other | Attending: Orthopedic Surgery | Admitting: Orthopedic Surgery

## 2021-04-26 ENCOUNTER — Inpatient Hospital Stay (HOSPITAL_COMMUNITY): Payer: Medicaid Other | Admitting: Anesthesiology

## 2021-04-26 ENCOUNTER — Encounter (HOSPITAL_COMMUNITY)
Admission: RE | Disposition: A | Discharge status: Skilled Nursing Facility | Payer: Self-pay | Source: Home / Self Care | Attending: Orthopedic Surgery

## 2021-04-26 ENCOUNTER — Other Ambulatory Visit: Payer: Self-pay

## 2021-04-26 ENCOUNTER — Encounter (HOSPITAL_COMMUNITY): Payer: Self-pay | Admitting: Orthopedic Surgery

## 2021-04-26 DIAGNOSIS — E785 Hyperlipidemia, unspecified: Secondary | ICD-10-CM | POA: Diagnosis not present

## 2021-04-26 DIAGNOSIS — Z91030 Bee allergy status: Secondary | ICD-10-CM | POA: Diagnosis not present

## 2021-04-26 DIAGNOSIS — Z89512 Acquired absence of left leg below knee: Secondary | ICD-10-CM | POA: Diagnosis not present

## 2021-04-26 DIAGNOSIS — I70201 Unspecified atherosclerosis of native arteries of extremities, right leg: Secondary | ICD-10-CM | POA: Diagnosis not present

## 2021-04-26 DIAGNOSIS — Y835 Amputation of limb(s) as the cause of abnormal reaction of the patient, or of later complication, without mention of misadventure at the time of the procedure: Secondary | ICD-10-CM | POA: Diagnosis present

## 2021-04-26 DIAGNOSIS — F17290 Nicotine dependence, other tobacco product, uncomplicated: Secondary | ICD-10-CM | POA: Diagnosis not present

## 2021-04-26 DIAGNOSIS — I96 Gangrene, not elsewhere classified: Secondary | ICD-10-CM

## 2021-04-26 DIAGNOSIS — Z833 Family history of diabetes mellitus: Secondary | ICD-10-CM | POA: Diagnosis not present

## 2021-04-26 DIAGNOSIS — I5021 Acute systolic (congestive) heart failure: Secondary | ICD-10-CM | POA: Diagnosis present

## 2021-04-26 DIAGNOSIS — Z89422 Acquired absence of other left toe(s): Secondary | ICD-10-CM

## 2021-04-26 DIAGNOSIS — I110 Hypertensive heart disease with heart failure: Secondary | ICD-10-CM | POA: Diagnosis present

## 2021-04-26 DIAGNOSIS — E1169 Type 2 diabetes mellitus with other specified complication: Secondary | ICD-10-CM | POA: Diagnosis not present

## 2021-04-26 DIAGNOSIS — M869 Osteomyelitis, unspecified: Secondary | ICD-10-CM | POA: Diagnosis not present

## 2021-04-26 DIAGNOSIS — H409 Unspecified glaucoma: Secondary | ICD-10-CM | POA: Diagnosis not present

## 2021-04-26 DIAGNOSIS — E1152 Type 2 diabetes mellitus with diabetic peripheral angiopathy with gangrene: Secondary | ICD-10-CM | POA: Diagnosis not present

## 2021-04-26 DIAGNOSIS — M7989 Other specified soft tissue disorders: Secondary | ICD-10-CM | POA: Diagnosis not present

## 2021-04-26 DIAGNOSIS — L97919 Non-pressure chronic ulcer of unspecified part of right lower leg with unspecified severity: Secondary | ICD-10-CM | POA: Diagnosis not present

## 2021-04-26 DIAGNOSIS — T8130XA Disruption of wound, unspecified, initial encounter: Secondary | ICD-10-CM | POA: Diagnosis present

## 2021-04-26 DIAGNOSIS — T8781 Dehiscence of amputation stump: Secondary | ICD-10-CM | POA: Diagnosis not present

## 2021-04-26 DIAGNOSIS — M86071 Acute hematogenous osteomyelitis, right ankle and foot: Secondary | ICD-10-CM | POA: Diagnosis present

## 2021-04-26 DIAGNOSIS — G8918 Other acute postprocedural pain: Secondary | ICD-10-CM | POA: Diagnosis not present

## 2021-04-26 DIAGNOSIS — Z20822 Contact with and (suspected) exposure to covid-19: Secondary | ICD-10-CM | POA: Diagnosis present

## 2021-04-26 DIAGNOSIS — Z89411 Acquired absence of right great toe: Secondary | ICD-10-CM

## 2021-04-26 DIAGNOSIS — Z89412 Acquired absence of left great toe: Secondary | ICD-10-CM

## 2021-04-26 HISTORY — PX: AMPUTATION: SHX166

## 2021-04-26 LAB — GLUCOSE, CAPILLARY
Glucose-Capillary: 356 mg/dL — ABNORMAL HIGH (ref 70–99)
Glucose-Capillary: 346 mg/dL — ABNORMAL HIGH (ref 70–99)
Glucose-Capillary: 203 mg/dL — ABNORMAL HIGH (ref 70–99)
Glucose-Capillary: 81 mg/dL (ref 70–99)
Glucose-Capillary: 115 mg/dL — ABNORMAL HIGH (ref 70–99)

## 2021-04-26 LAB — SARS CORONAVIRUS 2 BY RT PCR (HOSPITAL ORDER, PERFORMED IN ~~LOC~~ HOSPITAL LAB): SARS Coronavirus 2: NEGATIVE

## 2021-04-26 SURGERY — AMPUTATION BELOW KNEE
Anesthesia: Regional | Site: Knee | Laterality: Right

## 2021-04-26 MED ORDER — POTASSIUM CHLORIDE CRYS ER 20 MEQ PO TBCR
20.0000 meq | EXTENDED_RELEASE_TABLET | Freq: Every day | ORAL | Status: DC
  Administered 2021-04-27 – 2021-04-28 (×2): 20 meq via ORAL
  Filled 2021-04-26 (×2): qty 1

## 2021-04-26 MED ORDER — FERROUS SULFATE 325 (65 FE) MG PO TABS
325.0000 mg | ORAL_TABLET | Freq: Every day | ORAL | Status: DC
  Administered 2021-04-27 – 2021-05-02 (×6): 325 mg via ORAL
  Filled 2021-04-26 (×6): qty 1

## 2021-04-26 MED ORDER — FENTANYL CITRATE (PF) 100 MCG/2ML IJ SOLN
INTRAMUSCULAR | Status: AC
  Filled 2021-04-26: qty 2

## 2021-04-26 MED ORDER — INSULIN ASPART 100 UNIT/ML IJ SOLN: 0.0000 [IU] | Freq: Three times a day (TID) | INTRAMUSCULAR | Status: DC

## 2021-04-26 MED ORDER — OXYCODONE HCL 5 MG PO TABS
10.0000 mg | ORAL_TABLET | ORAL | Status: DC | PRN
  Administered 2021-04-26: 15 mg via ORAL
  Administered 2021-04-27: 10 mg via ORAL
  Administered 2021-04-28 – 2021-04-29 (×4): 15 mg via ORAL
  Administered 2021-04-29: 10 mg via ORAL
  Administered 2021-04-29 – 2021-04-30 (×2): 15 mg via ORAL
  Filled 2021-04-26 (×6): qty 3
  Filled 2021-04-26 (×2): qty 2
  Filled 2021-04-26: qty 3

## 2021-04-26 MED ORDER — OXYCODONE HCL 5 MG PO TABS
5.0000 mg | ORAL_TABLET | ORAL | Status: DC | PRN
  Administered 2021-04-26 – 2021-05-01 (×5): 10 mg via ORAL
  Filled 2021-04-26 (×6): qty 2

## 2021-04-26 MED ORDER — POLYETHYLENE GLYCOL 3350 17 G PO PACK: 17.0000 g | PACK | Freq: Every day | ORAL | Status: DC | PRN

## 2021-04-26 MED ORDER — MIDAZOLAM HCL 2 MG/2ML IJ SOLN
INTRAMUSCULAR | Status: AC
  Filled 2021-04-26: qty 2

## 2021-04-26 MED ORDER — GABAPENTIN 300 MG PO CAPS
300.0000 mg | ORAL_CAPSULE | Freq: Three times a day (TID) | ORAL | Status: DC
  Administered 2021-04-27 – 2021-05-02 (×17): 300 mg via ORAL
  Filled 2021-04-26 (×17): qty 1

## 2021-04-26 MED ORDER — FENTANYL CITRATE (PF) 250 MCG/5ML IJ SOLN
INTRAMUSCULAR | Status: AC
  Filled 2021-04-26: qty 5

## 2021-04-26 MED ORDER — PANTOPRAZOLE SODIUM 40 MG PO TBEC
40.0000 mg | DELAYED_RELEASE_TABLET | Freq: Every day | ORAL | Status: DC
  Administered 2021-04-26 – 2021-05-02 (×7): 40 mg via ORAL
  Filled 2021-04-26 (×7): qty 1

## 2021-04-26 MED ORDER — INSULIN ASPART 100 UNIT/ML IJ SOLN
INTRAMUSCULAR | Status: AC
  Administered 2021-04-26: 5 [IU] via SUBCUTANEOUS
  Filled 2021-04-26: qty 1

## 2021-04-26 MED ORDER — JUVEN PO PACK
1.0000 | PACK | Freq: Two times a day (BID) | ORAL | Status: DC
  Administered 2021-04-27 – 2021-05-02 (×7): 1 via ORAL
  Filled 2021-04-26 (×9): qty 1

## 2021-04-26 MED ORDER — LIDOCAINE 2% (20 MG/ML) 5 ML SYRINGE
INTRAMUSCULAR | Status: DC | PRN
  Administered 2021-04-26: 60 mg via INTRAVENOUS

## 2021-04-26 MED ORDER — CHLORHEXIDINE GLUCONATE 0.12 % MT SOLN
15.0000 mL | Freq: Once | OROMUCOSAL | Status: AC
Start: 2021-04-26 — End: 2021-04-26
  Administered 2021-04-26: 15 mL via OROMUCOSAL
  Filled 2021-04-26: qty 15

## 2021-04-26 MED ORDER — ONDANSETRON HCL 4 MG/2ML IJ SOLN: 4.0000 mg | Freq: Four times a day (QID) | INTRAMUSCULAR | Status: DC | PRN

## 2021-04-26 MED ORDER — FENTANYL CITRATE (PF) 100 MCG/2ML IJ SOLN
25.0000 ug | INTRAMUSCULAR | Status: DC | PRN
  Administered 2021-04-26 (×3): 50 ug via INTRAVENOUS

## 2021-04-26 MED ORDER — ONDANSETRON HCL 4 MG/2ML IJ SOLN
INTRAMUSCULAR | Status: DC | PRN
  Administered 2021-04-26: 4 mg via INTRAVENOUS

## 2021-04-26 MED ORDER — BUPIVACAINE-EPINEPHRINE (PF) 0.5% -1:200000 IJ SOLN
INTRAMUSCULAR | Status: DC | PRN
  Administered 2021-04-26: 30 mL via PERINEURAL

## 2021-04-26 MED ORDER — HYDROMORPHONE HCL 1 MG/ML IJ SOLN
0.5000 mg | INTRAMUSCULAR | Status: DC | PRN
  Administered 2021-04-26: 1 mg via INTRAVENOUS
  Administered 2021-04-27: 0.5 mg via INTRAVENOUS
  Filled 2021-04-26 (×2): qty 1

## 2021-04-26 MED ORDER — INSULIN ASPART 100 UNIT/ML IJ SOLN
0.0000 [IU] | INTRAMUSCULAR | Status: DC
  Administered 2021-04-26: 7 [IU] via SUBCUTANEOUS
  Filled 2021-04-26: qty 1

## 2021-04-26 MED ORDER — EMPAGLIFLOZIN 10 MG PO TABS
10.0000 mg | ORAL_TABLET | Freq: Every day | ORAL | Status: DC
  Administered 2021-04-27 – 2021-05-02 (×6): 10 mg via ORAL
  Filled 2021-04-26 (×6): qty 1

## 2021-04-26 MED ORDER — GUAIFENESIN-DM 100-10 MG/5ML PO SYRP: 15.0000 mL | ORAL_SOLUTION | ORAL | Status: DC | PRN

## 2021-04-26 MED ORDER — ASCORBIC ACID 500 MG PO TABS
1000.0000 mg | ORAL_TABLET | Freq: Every day | ORAL | Status: DC
  Administered 2021-04-26 – 2021-05-02 (×7): 1000 mg via ORAL
  Filled 2021-04-26 (×7): qty 2

## 2021-04-26 MED ORDER — SACUBITRIL-VALSARTAN 24-26 MG PO TABS
1.0000 | ORAL_TABLET | Freq: Two times a day (BID) | ORAL | Status: DC
  Administered 2021-04-26 – 2021-05-02 (×12): 1 via ORAL
  Filled 2021-04-26 (×13): qty 1

## 2021-04-26 MED ORDER — TORSEMIDE 20 MG PO TABS
20.0000 mg | ORAL_TABLET | Freq: Every day | ORAL | Status: DC
  Administered 2021-04-28 – 2021-05-02 (×5): 20 mg via ORAL
  Filled 2021-04-26 (×6): qty 1

## 2021-04-26 MED ORDER — 0.9 % SODIUM CHLORIDE (POUR BTL) OPTIME
TOPICAL | Status: DC | PRN
  Administered 2021-04-26: 1000 mL

## 2021-04-26 MED ORDER — INSULIN ASPART 100 UNIT/ML IJ SOLN: 5.0000 [IU] | Freq: Once | INTRAMUSCULAR | Status: AC

## 2021-04-26 MED ORDER — OXYCODONE HCL 5 MG/5ML PO SOLN
5.0000 mg | Freq: Once | ORAL | Status: AC | PRN
Start: 2021-04-26 — End: 2021-04-26

## 2021-04-26 MED ORDER — FENTANYL CITRATE (PF) 250 MCG/5ML IJ SOLN
INTRAMUSCULAR | Status: DC | PRN
  Administered 2021-04-26 (×2): 50 ug via INTRAVENOUS

## 2021-04-26 MED ORDER — ALUM & MAG HYDROXIDE-SIMETH 200-200-20 MG/5ML PO SUSP: 15.0000 mL | ORAL | Status: DC | PRN

## 2021-04-26 MED ORDER — PHENOL 1.4 % MT LIQD: 1.0000 | OROMUCOSAL | Status: DC | PRN

## 2021-04-26 MED ORDER — ACETAMINOPHEN 10 MG/ML IV SOLN
1000.0000 mg | Freq: Once | INTRAVENOUS | Status: DC | PRN
  Administered 2021-04-26: 1000 mg via INTRAVENOUS

## 2021-04-26 MED ORDER — DIGOXIN 125 MCG PO TABS
0.1250 mg | ORAL_TABLET | Freq: Every day | ORAL | Status: DC
  Administered 2021-04-26 – 2021-05-01 (×6): 0.125 mg via ORAL
  Filled 2021-04-26 (×6): qty 1

## 2021-04-26 MED ORDER — KETOROLAC TROMETHAMINE 30 MG/ML IJ SOLN
INTRAMUSCULAR | Status: AC
  Filled 2021-04-26: qty 1

## 2021-04-26 MED ORDER — SPIRONOLACTONE 25 MG PO TABS
25.0000 mg | ORAL_TABLET | Freq: Every day | ORAL | Status: DC
  Administered 2021-04-28 – 2021-05-02 (×5): 25 mg via ORAL
  Filled 2021-04-26 (×6): qty 1

## 2021-04-26 MED ORDER — OXYCODONE HCL 5 MG PO TABS
5.0000 mg | ORAL_TABLET | Freq: Once | ORAL | Status: AC | PRN
  Administered 2021-04-26: 5 mg via ORAL

## 2021-04-26 MED ORDER — LACTATED RINGERS IV SOLN: INTRAVENOUS | Status: DC

## 2021-04-26 MED ORDER — ACETAMINOPHEN 10 MG/ML IV SOLN
INTRAVENOUS | Status: AC
  Filled 2021-04-26: qty 100

## 2021-04-26 MED ORDER — KETOROLAC TROMETHAMINE 30 MG/ML IJ SOLN
15.0000 mg | Freq: Once | INTRAMUSCULAR | Status: AC
  Administered 2021-04-26: 15 mg via INTRAVENOUS

## 2021-04-26 MED ORDER — ORAL CARE MOUTH RINSE: 15.0000 mL | Freq: Once | OROMUCOSAL | Status: AC

## 2021-04-26 MED ORDER — MAGNESIUM SULFATE 2 GM/50ML IV SOLN
2.0000 g | Freq: Every day | INTRAVENOUS | Status: DC | PRN
  Filled 2021-04-26: qty 50

## 2021-04-26 MED ORDER — AMIODARONE HCL 200 MG PO TABS
200.0000 mg | ORAL_TABLET | Freq: Every day | ORAL | Status: DC
  Administered 2021-04-26 – 2021-05-02 (×6): 200 mg via ORAL
  Filled 2021-04-26 (×6): qty 1

## 2021-04-26 MED ORDER — BISACODYL 5 MG PO TBEC: 5.0000 mg | DELAYED_RELEASE_TABLET | Freq: Every day | ORAL | Status: DC | PRN

## 2021-04-26 MED ORDER — CARVEDILOL 3.125 MG PO TABS
3.1250 mg | ORAL_TABLET | Freq: Two times a day (BID) | ORAL | Status: DC
  Administered 2021-04-27 – 2021-05-02 (×10): 3.125 mg via ORAL
  Filled 2021-04-26 (×11): qty 1

## 2021-04-26 MED ORDER — ATORVASTATIN CALCIUM 10 MG PO TABS
10.0000 mg | ORAL_TABLET | Freq: Every day | ORAL | Status: DC
  Administered 2021-04-27 – 2021-05-02 (×6): 10 mg via ORAL
  Filled 2021-04-26 (×6): qty 1

## 2021-04-26 MED ORDER — DOCUSATE SODIUM 100 MG PO CAPS
100.0000 mg | ORAL_CAPSULE | Freq: Every day | ORAL | Status: DC
  Administered 2021-04-27 – 2021-05-02 (×5): 100 mg via ORAL
  Filled 2021-04-26 (×6): qty 1

## 2021-04-26 MED ORDER — PROPOFOL 10 MG/ML IV BOLUS
INTRAVENOUS | Status: AC
  Filled 2021-04-26: qty 20

## 2021-04-26 MED ORDER — OXYCODONE HCL 5 MG PO TABS
ORAL_TABLET | ORAL | Status: AC
  Filled 2021-04-26: qty 1

## 2021-04-26 MED ORDER — MAGNESIUM CITRATE PO SOLN: 1.0000 | Freq: Once | ORAL | Status: DC | PRN

## 2021-04-26 MED ORDER — SODIUM CHLORIDE 0.9 % IV SOLN: INTRAVENOUS | Status: DC

## 2021-04-26 MED ORDER — ACETAMINOPHEN 325 MG PO TABS
325.0000 mg | ORAL_TABLET | Freq: Four times a day (QID) | ORAL | Status: DC | PRN
  Administered 2021-04-27 (×2): 650 mg via ORAL
  Filled 2021-04-26 (×2): qty 2

## 2021-04-26 MED ORDER — ASPIRIN 81 MG PO CHEW
81.0000 mg | CHEWABLE_TABLET | Freq: Every day | ORAL | Status: DC
  Administered 2021-04-26 – 2021-05-02 (×7): 81 mg via ORAL
  Filled 2021-04-26 (×7): qty 1

## 2021-04-26 MED ORDER — CEFAZOLIN SODIUM-DEXTROSE 2-4 GM/100ML-% IV SOLN
2.0000 g | INTRAVENOUS | Status: AC
  Administered 2021-04-26: 2 g via INTRAVENOUS
  Filled 2021-04-26: qty 100

## 2021-04-26 MED ORDER — PROPOFOL 10 MG/ML IV BOLUS
INTRAVENOUS | Status: DC | PRN
  Administered 2021-04-26: 200 mg via INTRAVENOUS

## 2021-04-26 MED ORDER — ONDANSETRON HCL 4 MG/2ML IJ SOLN
INTRAMUSCULAR | Status: AC
  Filled 2021-04-26: qty 2

## 2021-04-26 MED ORDER — FENTANYL CITRATE (PF) 100 MCG/2ML IJ SOLN
INTRAMUSCULAR | Status: AC
  Administered 2021-04-26: 100 ug via INTRAVENOUS
  Filled 2021-04-26: qty 2

## 2021-04-26 MED ORDER — TRANEXAMIC ACID-NACL 1000-0.7 MG/100ML-% IV SOLN
1000.0000 mg | Freq: Once | INTRAVENOUS | Status: AC
  Administered 2021-04-26: 1000 mg via INTRAVENOUS
  Filled 2021-04-26: qty 100

## 2021-04-26 MED ORDER — ZINC SULFATE 220 (50 ZN) MG PO CAPS
220.0000 mg | ORAL_CAPSULE | Freq: Every day | ORAL | Status: DC
  Administered 2021-04-26 – 2021-05-02 (×7): 220 mg via ORAL
  Filled 2021-04-26 (×7): qty 1

## 2021-04-26 MED ORDER — PROMETHAZINE HCL 25 MG/ML IJ SOLN: 6.2500 mg | INTRAMUSCULAR | Status: DC | PRN

## 2021-04-26 MED ORDER — CEFAZOLIN SODIUM-DEXTROSE 2-4 GM/100ML-% IV SOLN
2.0000 g | Freq: Three times a day (TID) | INTRAVENOUS | Status: AC
  Administered 2021-04-26 – 2021-04-27 (×2): 2 g via INTRAVENOUS
  Filled 2021-04-26 (×2): qty 100

## 2021-04-26 MED ORDER — INSULIN ASPART 100 UNIT/ML IJ SOLN
0.0000 [IU] | Freq: Three times a day (TID) | INTRAMUSCULAR | Status: DC
  Administered 2021-04-27 – 2021-04-28 (×4): 5 [IU] via SUBCUTANEOUS
  Administered 2021-04-28: 1 [IU] via SUBCUTANEOUS
  Administered 2021-04-28 – 2021-04-29 (×2): 5 [IU] via SUBCUTANEOUS
  Administered 2021-04-29 – 2021-04-30 (×3): 2 [IU] via SUBCUTANEOUS
  Administered 2021-04-30: 11 [IU] via SUBCUTANEOUS
  Administered 2021-05-01: 2 [IU] via SUBCUTANEOUS
  Administered 2021-05-01: 5 [IU] via SUBCUTANEOUS
  Administered 2021-05-01: 2 [IU] via SUBCUTANEOUS
  Administered 2021-05-02: 5 [IU] via SUBCUTANEOUS
  Administered 2021-05-02: 3 [IU] via SUBCUTANEOUS

## 2021-04-26 MED ORDER — METFORMIN HCL 500 MG PO TABS
500.0000 mg | ORAL_TABLET | Freq: Two times a day (BID) | ORAL | Status: DC
  Administered 2021-04-28 – 2021-05-02 (×9): 500 mg via ORAL
  Filled 2021-04-26 (×9): qty 1

## 2021-04-26 MED ORDER — FENTANYL CITRATE (PF) 100 MCG/2ML IJ SOLN: 100.0000 ug | Freq: Once | INTRAMUSCULAR | Status: AC

## 2021-04-26 MED ORDER — AMISULPRIDE (ANTIEMETIC) 5 MG/2ML IV SOLN: 10.0000 mg | Freq: Once | INTRAVENOUS | Status: DC | PRN

## 2021-04-26 SURGICAL SUPPLY — 37 items
BLADE SAW RECIP 87.9 MT (BLADE) ×2 IMPLANT
BLADE SURG 21 STRL SS (BLADE) ×2 IMPLANT
BNDG COHESIVE 6X5 TAN STRL LF (GAUZE/BANDAGES/DRESSINGS) IMPLANT
CANISTER WOUND CARE 500ML ATS (WOUND CARE) ×2 IMPLANT
COVER SURGICAL LIGHT HANDLE (MISCELLANEOUS) ×2 IMPLANT
COVER WAND RF STERILE (DRAPES) IMPLANT
CUFF TOURN SGL QUICK 34 (TOURNIQUET CUFF) ×2
CUFF TRNQT CYL 34X4.125X (TOURNIQUET CUFF) ×1 IMPLANT
DRAPE INCISE IOBAN 66X45 STRL (DRAPES) ×2 IMPLANT
DRAPE U-SHAPE 47X51 STRL (DRAPES) ×2 IMPLANT
DRESSING PREVENA PLUS CUSTOM (GAUZE/BANDAGES/DRESSINGS) ×1 IMPLANT
DRSG PREVENA PLUS CUSTOM (GAUZE/BANDAGES/DRESSINGS) ×2
DRSG VERAFLO VAC MED (GAUZE/BANDAGES/DRESSINGS) ×2 IMPLANT
DURAPREP 26ML APPLICATOR (WOUND CARE) ×2 IMPLANT
ELECT REM PT RETURN 9FT ADLT (ELECTROSURGICAL) ×2
ELECTRODE REM PT RTRN 9FT ADLT (ELECTROSURGICAL) ×1 IMPLANT
GLOVE SURG ORTHO LTX SZ9 (GLOVE) ×2 IMPLANT
GLOVE SURG UNDER POLY LF SZ9 (GLOVE) ×2 IMPLANT
GOWN STRL REUS W/ TWL XL LVL3 (GOWN DISPOSABLE) ×2 IMPLANT
GOWN STRL REUS W/TWL XL LVL3 (GOWN DISPOSABLE) ×4
KIT BASIN OR (CUSTOM PROCEDURE TRAY) ×2 IMPLANT
KIT TURNOVER KIT B (KITS) ×2 IMPLANT
MANIFOLD NEPTUNE II (INSTRUMENTS) ×2 IMPLANT
NS IRRIG 1000ML POUR BTL (IV SOLUTION) ×2 IMPLANT
PACK ORTHO EXTREMITY (CUSTOM PROCEDURE TRAY) ×2 IMPLANT
PAD ARMBOARD 7.5X6 YLW CONV (MISCELLANEOUS) ×2 IMPLANT
PREVENA RESTOR ARTHOFORM 46X30 (CANNISTER) ×2 IMPLANT
SPONGE LAP 18X18 RF (DISPOSABLE) IMPLANT
STAPLER VISISTAT 35W (STAPLE) IMPLANT
STOCKINETTE IMPERVIOUS LG (DRAPES) ×2 IMPLANT
SUT ETHILON 2 0 PSLX (SUTURE) IMPLANT
SUT SILK 2 0 (SUTURE) ×2
SUT SILK 2-0 18XBRD TIE 12 (SUTURE) ×1 IMPLANT
SUT VIC AB 1 CTX 27 (SUTURE) ×4 IMPLANT
TOWEL GREEN STERILE (TOWEL DISPOSABLE) ×2 IMPLANT
TUBE CONNECTING 12X1/4 (SUCTIONS) ×2 IMPLANT
YANKAUER SUCT BULB TIP NO VENT (SUCTIONS) ×2 IMPLANT

## 2021-04-26 NOTE — Anesthesia Procedure Notes (Signed)
Anesthesia Regional Block: Popliteal block   Pre-Anesthetic Checklist: , timeout performed,  Correct Patient, Correct Site, Correct Laterality,  Correct Procedure,, site marked,  Risks and benefits discussed,  Surgical consent,  Pre-op evaluation,  At surgeon's request and post-op pain management  Laterality: Right  Prep: chloraprep       Needles:  Injection technique: Single-shot  Needle Type: Echogenic Stimulator Needle     Needle Length: 10cm  Needle Gauge: 20     Additional Needles:   Procedures:,,,, ultrasound used (permanent image in chart),,    Narrative:  Start time: 04/26/2021 1:50 PM End time: 04/26/2021 2:00 PM Injection made incrementally with aspirations every 5 mL.  Performed by: Personally  Anesthesiologist: Leonides Grills, MD  Additional Notes: Functioning IV was confirmed and monitors were applied.  A 20ga BBraun echogenic stimulator needle was used. Sterile prep, hand hygiene and sterile gloves were used.  Negative aspiration and negative test dose prior to incremental administration of local anesthetic. The patient tolerated the procedure well.

## 2021-04-26 NOTE — Progress Notes (Signed)
Patient is a poor historian regarding last time medications were taken. Patient states " I took Glucophage and Oxycodone this morning and nothing else". Patient states he does not remember the last time he took his other medications because he does not take them regularly. Dr. Bradley Ferris made aware.

## 2021-04-26 NOTE — Plan of Care (Signed)

## 2021-04-26 NOTE — Transfer of Care (Signed)
Immediate Anesthesia Transfer of Care Note  Patient: Scott Greer  Procedure(s) Performed: RIGHT BELOW KNEE AMPUTATION (Right: Knee)  Patient Location: PACU  Anesthesia Type:General  Level of Consciousness: awake, alert  and oriented  Airway & Oxygen Therapy: Patient Spontanous Breathing and Patient connected to face mask oxygen  Post-op Assessment: Report given to RN and Post -op Vital signs reviewed and stable  Post vital signs: Reviewed and stable  Last Vitals:  Vitals Value Taken Time  BP 132/74 04/26/21 1600  Temp 36.9 C 04/26/21 1600  Pulse 81 04/26/21 1606  Resp 12 04/26/21 1606  SpO2 100 % 04/26/21 1606  Vitals shown include unvalidated device data.  Last Pain:  Vitals:   04/26/21 1600  TempSrc:   PainSc: 0-No pain      Patients Stated Pain Goal: 3 (04/26/21 1450)  Complications: No notable events documented.

## 2021-04-26 NOTE — Interval H&P Note (Signed)
History and Physical Interval Note:  04/26/2021 3:06 PM  Scott Greer  has presented today for surgery, with the diagnosis of Gangrene Right Foot.  The various methods of treatment have been discussed with the patient and family. After consideration of risks, benefits and other options for treatment, the patient has consented to  Procedure(s): RIGHT BELOW KNEE AMPUTATION (Right) as a surgical intervention.  The patient's history has been reviewed, patient examined, no change in status, stable for surgery.  I have reviewed the patient's chart and labs.  Questions were answered to the patient's satisfaction.     [name]

## 2021-04-26 NOTE — Anesthesia Preprocedure Evaluation (Addendum)
Anesthesia Evaluation  Patient identified by MRN, date of birth, ID band Patient awake    Reviewed: Allergy & Precautions, NPO status , Patient's Chart, lab work & pertinent test results  Airway Mallampati: III  TM Distance: >3 FB Neck ROM: Full    Dental no notable dental hx.    Pulmonary Current Smoker and Patient abstained from smoking.,    Pulmonary exam normal breath sounds clear to auscultation       Cardiovascular hypertension, Pt. on medications and Pt. on home beta blockers +CHF  Normal cardiovascular exam Rhythm:Regular Rate:Normal  ECG: rate 75   Neuro/Psych negative neurological ROS  negative psych ROS   GI/Hepatic negative GI ROS, (+)     substance abuse  ,   Endo/Other  diabetes, Oral Hypoglycemic Agents  Renal/GU Renal disease     Musculoskeletal negative musculoskeletal ROS (+)   Abdominal   Peds  Hematology HLD   Anesthesia Other Findings Gangrene Right Foot  Reproductive/Obstetrics                            Anesthesia Physical Anesthesia Plan  ASA: 3  Anesthesia Plan: General and Regional   Post-op Pain Management: GA combined w/ Regional for post-op pain   Induction: Intravenous  PONV Risk Score and Plan: 1 and Ondansetron, Dexamethasone, Midazolam and Treatment may vary due to age or medical condition  Airway Management Planned: LMA  Additional Equipment:   Intra-op Plan:   Post-operative Plan: Extubation in OR  Informed Consent: I have reviewed the patients History and Physical, chart, labs and discussed the procedure including the risks, benefits and alternatives for the proposed anesthesia with the patient or authorized representative who has indicated his/her understanding and acceptance.     Dental advisory given  Plan Discussed with: CRNA  Anesthesia Plan Comments:         Anesthesia Quick Evaluation

## 2021-04-26 NOTE — Anesthesia Postprocedure Evaluation (Signed)
Anesthesia Post Note  Patient: Scott Greer  Procedure(s) Performed: RIGHT BELOW KNEE AMPUTATION (Right: Knee)     Patient location during evaluation: PACU Anesthesia Type: Regional and General Level of consciousness: awake Pain management: pain level controlled Vital Signs Assessment: post-procedure vital signs reviewed and stable Respiratory status: spontaneous breathing, nonlabored ventilation, respiratory function stable and patient connected to nasal cannula oxygen Cardiovascular status: blood pressure returned to baseline and stable Postop Assessment: no apparent nausea or vomiting Anesthetic complications: no   No notable events documented.  Last Vitals:  Vitals:   04/26/21 1733 04/26/21 2109  BP: 118/83 111/71  Pulse: 60 (!) 59  Resp: 16 18  Temp: 36.4 C   SpO2: 100% 94%    Last Pain:  Vitals:   04/26/21 2103  TempSrc:   PainSc: 10-Worst pain ever                 Donabelle Molden P Gayanne Prescott

## 2021-04-26 NOTE — Anesthesia Procedure Notes (Signed)
Procedure Name: LMA Insertion Date/Time: 04/26/2021 3:17 PM Performed by: Mayer Camel, CRNA Pre-anesthesia Checklist: Patient identified, Emergency Drugs available, Suction available and Patient being monitored Patient Re-evaluated:Patient Re-evaluated prior to induction Oxygen Delivery Method: Circle System Utilized Preoxygenation: Pre-oxygenation with 100% oxygen Induction Type: IV induction LMA: LMA inserted LMA Size: 5.0 Number of attempts: 1 Airway Equipment and Method: Bite block Placement Confirmation: positive ETCO2 Tube secured with: Tape Dental Injury: Teeth and Oropharynx as per pre-operative assessment

## 2021-04-26 NOTE — Progress Notes (Signed)
Patient's blood sugar 356 upon arrival to short stay. Dr. Sandford Craze made aware and gave a verbal order for insulin (novolog) insulin 5 units. Insulin given at 1335.

## 2021-04-26 NOTE — H&P (Signed)
Scott Greer is an 65 y.o. male.   Chief Complaint: right foot wound dehiscence HPI: Patient is a 65 year old gentleman who is about 10 days status post right foot first and second ray amputation.  Patient has had progressive wound necrosis.  Patient did have triphasic dopplerable dorsalis pedis and posterior tibial pulses.  I have discussed with the patient that the problem is with his microcirculation.  Past Medical History:  Diagnosis Date   CHF (congestive heart failure) (HCC)    Dehiscence of amputation stump (HCC)    left great toe   Diabetes mellitus without complication (HCC) 1999   Type II   Glaucoma 2015   Hyperlipidemia    Hypertension 2005   Left foot infection 02/07/2021   Osteomyelitis (HCC)    Wears glasses     Past Surgical History:  Procedure Laterality Date   AMPUTATION Left 06/05/2019   Procedure: LEFT GREAT TOE AMPUTATION;  Surgeon: [name], MD;  Location: [facility] OR;  Service: Orthopedics;  Laterality: Left;   AMPUTATION Left 07/10/2019   Procedure: LEFT FOOT 1ST RAY AMPUTATION;  Surgeon: [name], MD;  Location[facility] OR;  Service: Orthopedics;  Laterality: Left;   AMPUTATION Right 05/25/2020   Procedure: RIGHT GREAT TOE AMPUTATION;  Surgeon: [name], MD;  Location: [facility] OR;  Service: Orthopedics;  Laterality: Right;   AMPUTATION Left 01/25/2021   Procedure: LEFT 2ND TOE AMPUTATION;  Surgeon: [name], MD;  Location: [facility] OR;  Service: Orthopedics;  Laterality: Left;   AMPUTATION Left 02/08/2021   Procedure: LEFT TRANSMETATARSAL AMPUTATION;  Surgeon: [name], MD;  Location: [facility] OR;  Service: Orthopedics;  Laterality: Left;   AMPUTATION Left 02/10/2021   Procedure: AMPUTATION BELOW KNEE;  Surgeon: [name], MD;  Location: [facility] OR;  Service: Orthopedics;  Laterality: Left;   AMPUTATION Right 04/14/2021   Procedure: RIGHT 1ST AND 2ND RAY AMPUTATION;  Surgeon: [name], MD;  Location: [facility] OR;  Service: Orthopedics;  Laterality: Right;   NO  PAST SURGERIES     RIGHT/LEFT HEART CATH AND CORONARY ANGIOGRAPHY N/A 01/02/2021   Procedure: RIGHT/LEFT HEART CATH AND CORONARY ANGIOGRAPHY;  Surgeon: Runell Gess, MD;  Location: [facility] INVASIVE CV LAB;  Service: Cardiovascular;  Laterality: N/A;    Family History  Problem Relation Age of Onset   Stroke Mother    Diabetes Mother        Toward end of life   Social History:  reports that he has been smoking cigars and cigarettes. He has quit using smokeless tobacco.  His smokeless tobacco use included chew. He reports current alcohol use of about 11.0 standard drinks of alcohol per week. He reports current drug use. Drug: Marijuana.  Allergies:  Allergies  Allergen Reactions   Bee Venom Hives, Itching and Swelling    No medications prior to admission.    No results found for this or any previous visit (from the past 48 hour(s)). No results found.  Review of Systems  All other systems reviewed and are negative.  There were no vitals taken for this visit. Physical Exam  Patient is alert, oriented, no adenopathy, well-dressed, normal affect, normal respiratory effort. Examination patient has had progressive wound dehiscence and now has developed dry gangrenous changes to the third toe.  Patient's foot is extremely painful to palpation consistent with progressive ischemic changes.  There is no ascending cellulitis his calf is soft nontender.Lungs Clr Heart RRR Assessment/Plan . Dehiscence of amputation stump (HCC)  Plan: We will plan for a right transtibial amputation.  Patient has a well-healed stable left transtibial amputation and is currently working with biotech for prosthetic fitting.  Will plan for surgery tomorrow and anticipate discharge to skilled nursing postoperatively.  Patient states he understands and wishes to proceed with surgery as soon as possible.  [name] Ebin Palazzi, PA 04/26/2021, 6:35 AM

## 2021-04-26 NOTE — Op Note (Signed)
   Date of Surgery: 04/26/2021  INDICATIONS: Scott Greer is a 65 y.o.-year-old male who underwent limb salvage surgery with ray amputations of the right foot.  Despite patient having a palpable dorsalis pedis pulse he had progressive dry gangrenous changes to the right foot and presents at this time for right transtibial amputation.Marland Kitchen  PREOPERATIVE DIAGNOSIS: Gangrene right foot failed foot salvage intervention  POSTOPERATIVE DIAGNOSIS: Same.  PROCEDURE: Transtibial amputation Application of Prevena wound VAC  SURGEON: Lajoyce Corners, M.D.  ANESTHESIA:  general  IV FLUIDS AND URINE: See anesthesia records.  ESTIMATED BLOOD LOSS: See anesthesia records.  COMPLICATIONS: None.  DESCRIPTION OF PROCEDURE: The patient was brought to the operating room after undergoing regional anesthetic. After adequate levels of anesthesia were obtained patient's lower extremity was prepped using DuraPrep draped into a sterile field. A timeout was called. The foot was draped out of the sterile field with impervious stockinette. A transverse incision was made 11 cm distal to the tibial tubercle. This curved proximally and a large posterior flap was created. The tibia was transected 1 cm proximal to the skin incision. The fibula was transected just proximal to the tibial incision. The tibia was beveled anteriorly. A large posterior flap was created. The sciatic nerve was pulled cut and allowed to retract. The vascular bundles were suture ligated with 2-0 silk. The deep and superficial fascial layers were closed using #1 Vicryl. The skin was closed using staples and 2-0 nylon. The wound was covered with a Prevena customizable and arthroform wound VAC.  The dressing was sealed with dermatac there was a good suction fit. A prosthetic shrinker and limb protector were applied. Patient was taken to the PACU in stable condition.   DISCHARGE PLANNING:  Antibiotic duration: 24 hours  Weightbearing: Nonweightbearing on the operative  extremity  Pain medication: Opioid pathway  Dressing care/ Wound VAC: Continue wound VAC for 1 week after discharge  Discharge to: Discharge planning based on therapy's recommendations for possible inpatient rehabilitation, outpatient rehabilitation, or discharge to home with therapy  Follow-up: In the office 1 week post operative.  Scott Baker, MD [facility] 4:01 PM

## 2021-04-26 NOTE — Progress Notes (Signed)
Orthopedic Tech Progress Note Patient Details:  CORIAN HANDLEY 01/26/1956 [id number]  Patient has on VIVE PROTOCOL    Patient ID: HARTMAN MINAHAN, male   DOB: 1955/11/06, 65 y.o.   MRN: [id number]  [name] 04/26/2021, 6:31 PM

## 2021-04-27 ENCOUNTER — Encounter (HOSPITAL_COMMUNITY): Payer: Self-pay | Admitting: Orthopedic Surgery

## 2021-04-27 ENCOUNTER — Other Ambulatory Visit: Payer: Self-pay

## 2021-04-27 LAB — BASIC METABOLIC PANEL
Anion gap: 9 (ref 5–15)
BUN: 32 mg/dL — ABNORMAL HIGH (ref 8–23)
CO2: 28 mmol/L (ref 22–32)
Calcium: 8.5 mg/dL — ABNORMAL LOW (ref 8.9–10.3)
Chloride: 91 mmol/L — ABNORMAL LOW (ref 98–111)
Creatinine, Ser: 1.36 mg/dL — ABNORMAL HIGH (ref 0.61–1.24)
GFR, Estimated: 59 mL/min — ABNORMAL LOW (ref >60)
Glucose, Bld: 188 mg/dL — ABNORMAL HIGH (ref 70–99)
Potassium: 4.7 mmol/L (ref 3.5–5.1)
Sodium: 128 mmol/L — ABNORMAL LOW (ref 135–145)

## 2021-04-27 LAB — CBC
HCT: 24.2 % — ABNORMAL LOW (ref 39.0–52.0)
Hemoglobin: 7.8 g/dL — ABNORMAL LOW (ref 13.0–17.0)
MCH: 27.7 pg (ref 26.0–34.0)
MCHC: 32.2 g/dL (ref 30.0–36.0)
MCV: 85.8 fL (ref 80.0–100.0)
Platelets: 498 10*3/uL — ABNORMAL HIGH (ref 150–400)
RBC: 2.82 MIL/uL — ABNORMAL LOW (ref 4.22–5.81)
RDW: 14.4 % (ref 11.5–15.5)
WBC: 16.3 10*3/uL — ABNORMAL HIGH (ref 4.0–10.5)
nRBC: 0 % (ref 0.0–0.2)

## 2021-04-27 LAB — GLUCOSE, CAPILLARY
Glucose-Capillary: 217 mg/dL — ABNORMAL HIGH (ref 70–99)
Glucose-Capillary: 218 mg/dL — ABNORMAL HIGH (ref 70–99)
Glucose-Capillary: 247 mg/dL — ABNORMAL HIGH (ref 70–99)
Glucose-Capillary: 210 mg/dL — ABNORMAL HIGH (ref 70–99)

## 2021-04-27 LAB — PREPARE RBC (CROSSMATCH)

## 2021-04-27 MED ORDER — SODIUM CHLORIDE 0.9% IV SOLUTION: Freq: Once | INTRAVENOUS | Status: DC

## 2021-04-27 NOTE — TOC Progression Note (Signed)
Transition of Care [facility]) - Progression Note    Patient Details  Name: GAUGE WINSKI MRN: [id number] Date of Birth: Jun 17, 1956  [facility] CM/SW Contact  Ralene Bathe, LCSWA Phone Number: 04/27/2021, 6:40 PM  Clinical Narrative:     CSW spoke with patient at length about PT/OT recommendations for d/c.  Patient states that he is agreeable to SNF, but only if he can stay for a short amount of time.  Patient reports that he would rather go home.    SNF workup needed.       Expected Discharge Plan and Services                                                 Social Determinants of Health (SDOH) Interventions    Readmission Risk Interventions Readmission Risk Prevention Plan 06/08/2019  Transportation Screening Complete  PCP or Specialist Appt within 5-7 Days Complete  Home Care Screening Complete  Medication Review (RN CM) Complete  Some recent data might be hidden

## 2021-04-27 NOTE — Progress Notes (Signed)
Patient is postop day 1 status post transtibial amputation.  He is sitting up awake pleasant to exam and conversation.  Vital signs stable afebrile his wound VAC is functioning well with 1 green check, 0 cc in the canister   Patient states if at all possible he would like to discharge home he has some available help and does not have stairs.  He said he has been doing wheelchair transfers already.  Told him we will see how he does with physical therapy and see if this would be appropriate

## 2021-04-27 NOTE — Progress Notes (Signed)
[facility] IP rehab admissions - I met with patient and discussed CIR admission.  Noted patient performed with PT with supervision today.  OT eval and recommendations are pending.  Patient lives alone and has an ex-wife who stops by to assist.  May need SNF placement prior to DC home.  Will follow up tomorrow again.  Call for questions.  #[phone] 

## 2021-04-27 NOTE — Progress Notes (Signed)
Inpatient Diabetes Program Recommendations  AACE/ADA: New Consensus Statement on Inpatient Glycemic Control (2015)  Target Ranges:  Prepandial:   less than 140 mg/dL      Peak postprandial:   less than 180 mg/dL (1-2 hours)      Critically ill patients:  140 - 180 mg/dL   Lab Results  Component Value Date   GLUCAP 217 (H) 04/27/2021   HGBA1C 6.7 (H) 04/15/2021    Review of Glycemic Control Results for PARMINDER, CUPPLES (MRN [id number]) as of 04/27/2021 11:08  Ref. Range 04/26/2021 14:08 04/26/2021 16:01 04/26/2021 17:36 04/26/2021 19:51 04/27/2021 06:09  Glucose-Capillary Latest Ref Range: 70 - 99 mg/dL 094 (H) 076 (H) 81 808 (H) 217 (H)   Diabetes history: DM 2 Outpatient Diabetes medications: Metformin 500 mg bid, Jardiance 10 mg Daily Current orders for Inpatient glycemic control:  Novolog 0-15 units tid  A1c 6.7% on 6/11  Inpatient Diabetes Program Recommendations:    Consult for glycemic control from Nursing staff  Watch on current regimen as oral meds have started back today.  Thanks,  Christena Deem RN, MSN, BC-ADM Inpatient Diabetes Coordinator Team Pager [phone] (8a-5p)

## 2021-04-27 NOTE — Plan of Care (Signed)
  Problem: Education: Goal: Knowledge of General Education information will improve Description Including pain rating scale, medication(s)/side effects and non-pharmacologic comfort measures Outcome: Progressing   Problem: Health Behavior/Discharge Planning: Goal: Ability to manage health-related needs will improve Outcome: Progressing   

## 2021-04-27 NOTE — Evaluation (Addendum)
Physical Therapy Evaluation Patient Details Name: Scott Greer MRN: [id number] DOB: 01/19/1956 Today's Date: 04/27/2021   History of Present Illness  Pt is 65 yo male with failed R limb salvage and now s/p R BKA on 04/26/21.  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  Clinical Impression  Pt admitted with above diagnosis. Prior to admission, pt was living at home alone with intermittent support from ex-wife, aides, and neighbor.  He had recent L BKA in April and has been using a w/c.  Pt is now s/p R BKA.  Pt does not want to go to rehab or SNF at d/c.  He was able to complete anterior posterior transfer today with supervision.  Pt with good safety awareness regarding BKA precautions.  His largest barriers to return home that will need to be addressed are limited support, w/c does not have drop arms, and w/c will not fit into bathroom.  Will have to be able to do anterior posterior transfers at all times vs a new w/c.  Will need drop arm BSC as he may be unable to get into bathroom.  Additionally, would need increased support - if this is unavailable will need SNF. Pt currently with functional limitations due to the deficits listed below (see PT Problem List). Pt will benefit from skilled PT to increase their independence and safety with mobility to allow discharge to the venue listed below.    Addendum: Pt demonstrating decreased safety awareness/planning with OT later in day.  Spoke with OT and agree pt may need SNF due to safety concerns.  If pt does not agree, would need near 24 hr support and max HH services.      Follow Up Recommendations SNF (if pt doesn't agree will need 24 hr support and max HH services)    Equipment Recommendations  Other (comment) (Drop arm w/c and drop arm bsc and possible EMS transport home)    Recommendations for Other Services       Precautions / Restrictions  Precautions Precautions: Fall Required Braces or Orthoses: Other Brace Other Brace: Residual limb protector Restrictions RLE Weight Bearing: Non weight bearing      Mobility  Bed Mobility Overal bed mobility: Needs Assistance Bed Mobility: Rolling;Supine to Sit Rolling: Supervision   Supine to sit: Supervision          Transfers Overall transfer level: Needs assistance Equipment used: None Transfers: Licensed conveyancer transfers: Supervision   General transfer comment: Set-up of recliner; anterior-posterior transfer with supervision for safety and lines  Ambulation/Gait             General Gait Details: bil BKA  Stairs            Wheelchair Mobility    Modified Rankin (Stroke Patients Only)       Balance Overall balance assessment: Needs assistance Sitting-balance support: No upper extremity supported Sitting balance-Leahy Scale: Good         Standing balance comment: unable - bil BKA                             Pertinent Vitals/Pain Pain Assessment: 0-10 Pain Score: 7  Pain Location: R leg with movement Pain Descriptors / Indicators: Discomfort Pain Intervention(s): Limited activity within patient's tolerance;Monitored during session;Repositioned    Home Living Family/patient expects to be discharged to::  Private residence Living Arrangements: Alone Available Help at Discharge: Personal care attendant;Family;Available PRN/intermittently;Neighbor (Aide 2 hr everyday; ex-wife checks on him intermittently and assist with IADLs and ADLs) Type of Home: Apartment Home Access: Stairs to enter   [employer] of Steps: 1 step to porch Home Layout: One level Home Equipment: Cane - single point;Crutches;Wheelchair - [employer] - 2 wheels Additional Comments: Pt's ex-wife checks on him at times    Prior Function Level of Independence: Needs assistance   Gait / Transfers Assistance  Needed: Recently had L BKA in April and has been using w/c - he was getting ready to get prosthetic.  ADL's / Homemaking Assistance Needed: Using urinals at times; pt was performing basic ADLs but did have assist at times; had assist with IADLs  Comments: Was still driving after L BKA; reports issues with personal care attendants "being lazy" -not showing up on time, not helping with what he needed; reports using w/c in apartment but would not fit through bedroom or bathroom door; He was sleeping on couch in living room; To get to bathroom pt transferred onto a chair and then onto toielt since w/c would not fit through door     Hand Dominance   Dominant Hand: Right    Extremity/Trunk Assessment   Upper Extremity Assessment Upper Extremity Assessment: Overall WFL for tasks assessed    Lower Extremity Assessment Lower Extremity Assessment: LLE deficits/detail;RLE deficits/detail RLE Deficits / Details: New BKA 04/26/21.  ROM: hip WNL, knee 0 to 90 degrees; MMT: At least 3/5 throughout LLE Deficits / Details: BKA in April 2022, ROM WFL, Strength at least 3/5 throughout    Cervical / Trunk Assessment Cervical / Trunk Assessment: Normal  Communication   Communication: No difficulties  Cognition Arousal/Alertness: Awake/alert Behavior During Therapy: WFL for tasks assessed/performed Overall Cognitive Status: Within Functional Limits for tasks assessed                                        General Comments General comments (skin integrity, edema, etc.): Educated pt on PT role and POC.  Pt familiar with BKA precautions (NWB, resting with leg straight, limb protector, and wearing shrinker) but reinforced during therapy.  Pt asking for another shrinker for L LE -will notify ortho tech or  [facility].  Pt wanting to d/c home.  Increased time spent discussing home set up.  Recommending near 24 hr support (pt reports does not have but states neighbor and ex-wife are close and can  assist at any time).  Other issues is pt's w/c does not have drop arm and bathroom door too small for w/c    Exercises Amputee Exercises Quad Sets: AROM;Both;5 reps;Supine Knee Flexion: AROM;Both;Supine;5 reps   Assessment/Plan    PT Assessment Patient needs continued PT services  PT Problem List Decreased strength;Decreased range of motion;Decreased activity tolerance;Decreased balance;Decreased mobility;Decreased safety awareness;Decreased skin integrity;Pain;Decreased knowledge of use of DME       PT Treatment Interventions DME instruction;Functional mobility training;Therapeutic activities;Therapeutic exercise;Balance training;Neuromuscular re-education;Patient/family education;Wheelchair mobility training    PT Goals (Current goals can be found in the Care Plan section)  Acute Rehab PT Goals Patient Stated Goal: Go home PT Goal Formulation: With patient Time For Goal Achievement: 05/11/21 Potential to Achieve Goals: Good Additional Goals Additional Goal #1: W/C mobility 200' independently    Frequency Min 5X/week   Barriers to discharge Decreased caregiver support;Inaccessible home environment limited  help , w/c won't fit in bathroom, w/c does not have drop arm    Co-evaluation               AM-PAC PT "6 Clicks" Mobility  Outcome Measure Help needed turning from your back to your side while in a flat bed without using bedrails?: None Help needed moving from lying on your back to sitting on the side of a flat bed without using bedrails?: A Little Help needed moving to and from a bed to a chair (including a wheelchair)?: A Little Help needed standing up from a chair using your arms (e.g., wheelchair or bedside chair)?: A Little Help needed to walk in hospital room?: A Little Help needed climbing 3-5 steps with a railing? : A Little 6 Click Score: 19    End of Session Equipment Utilized During Treatment: Gait belt Activity Tolerance: Patient tolerated treatment  well Patient left: with call bell/phone within reach;in chair;with chair alarm set Nurse Communication: Mobility status PT Visit Diagnosis: Muscle weakness (generalized) (M62.81);Difficulty in walking, not elsewhere classified (R26.2)    Time: 1030-1110 PT Time Calculation (min) (ACUTE ONLY): 40 min   Charges:   PT Evaluation $PT Eval Low Complexity: 1 Low PT Treatments $Therapeutic Activity: 23-37 mins        Anise Salvo, PT Acute Rehab Services Pager [phone] [facility] [phone]   Rayetta Humphrey 04/27/2021, 11:31 AM

## 2021-04-27 NOTE — Progress Notes (Signed)
Patient refusing blood transfusion.  Education provided on rationale for receiving blood, but patient continues to refuse.  [name] Persons, PA notified.  PA states to continue holding blood pressure meds if remains low and to not give blood per patient's refusal.  Refusal of blood form signed by patient and placed in chart.

## 2021-04-27 NOTE — Progress Notes (Signed)
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   Clinical Impression   PTA pt living at home alone. Pt states he was driving until this admission and was "hopping on my right foot because I can't get my wheelchair into my bathroom or bedrooms". Friends assisted with errands if needed. Pt wanting to DC home, however when asked how pt would get into his apartment, he said "I will drive drive my car up to the step". After discussing that driving is not an option given his RLE is amputated, he stated "I will pop my wheelchair over the step" to get into his apartment. Pt unable to state how he would get into his bathroom or bedroom since they are not wheelchair accessible. Pt also stated that his LLE was amputated "sometime last year" - amputation was 02/2021. Pt requires assistance at this time with ADL tasks, IADL tasks and assistance to safely complete transfers while maintaining NWB WBS, as pt trying to push through end of residual limb on RLE. Pt not safe to DC home to current home environment. Unsure if pt has alternative living situation - pt states he can not live with his wife "because there are too many people there". If support available at DC, feel CIR is a good option. If support is limited, he will most likely need SNF. Regardless, pt would benefit from SW assistance to begin the process of searching for wc accessible housing in addition to other concerns he has regarding his living situation/paying his bills. Will follow acutely.     Follow Up Recommendations  SNF vs CIR (pending assistance from family/ home set up)    Equipment Recommendations  Wheelchair (measurements  OT);Wheelchair cushion (measurements OT) (drop arm wheelchair(pt states his arm rests are not removable)    Recommendations for Other Services       Precautions / Restrictions Precautions Precautions: Fall Required Braces or Orthoses: Other Brace Other Brace: Residual limb protector Restrictions Weight Bearing Restrictions: Yes RLE Weight Bearing: Non weight bearing      Mobility Bed Mobility Overal bed mobility: Needs Assistance Bed Mobility: Rolling;Supine to Sit Rolling: Supervision   Supine to sit: Supervision          Transfers - able to scoot in bed; transferred into bed with nsg. Mod VC to maintain WBS                      Balance                                           ADL either performed or assessed with clinical judgement   ADL Overall ADL's : Needs assistance/impaired     Grooming: Set up;Bed level   Upper Body Bathing: Set up;Bed level   Lower Body Bathing: Moderate assistance;Bed level Lower Body Bathing Details (indicate cue type and reason): to maintain WBS Upper Body Dressing : Set up;Sitting;Bed level   Lower Body Dressing: Bed level;Sitting/lateral leans;Maximal assistance Lower Body Dressing Details (indicate cue type and reason): unable to donn/doff limb protector; cues ot maintain WBS  General ADL Comments: Mobility at bed level. Mod VC to maintain NWB through RLE; Nsg states pt trying to push weight through end of R residual limb     Vision         Perception     Praxis      Pertinent Vitals/Pain Pain Assessment: 0-10 Pain Score: 8  Pain Location: R leg with movement Pain Descriptors / Indicators: Discomfort;Cramping Pain Intervention(s): Limited activity within patient's tolerance;Premedicated before session     Hand Dominance Right   Extremity/Trunk Assessment Upper Extremity Assessment Upper Extremity Assessment: Overall WFL for tasks assessed (c/o numbness in hands at  times)           Communication Communication Communication: No difficulties   Cognition Arousal/Alertness: Awake/alert Behavior During Therapy: WFL for tasks assessed/performed Overall Cognitive Status: No family/caregiver present to determine baseline cognitive functioning                                 General Comments: Cognition is most likely baseline, however pt with impaired judgement and insight and awareness of how his deficits impact his ability to function; pt said his LLE amputation was sometime last year - was actually in April of 2022; not adhering to [facility]   General Comments       Exercises     Shoulder Instructions      Home Living Family/patient expects to be discharged to:: Private residence Living Arrangements: Alone Available Help at Discharge: Personal care attendant;Family;Available PRN/intermittently;Neighbor Type of Home: Apartment Home Access: Stairs to enter [employer] of Steps: 1 step to porch   Home Layout: One level     Bathroom Shower/Tub: Chief Strategy Officer: Standard Bathroom Accessibility: No   Home Equipment: Cane - single point;Crutches;Wheelchair - [employer] - 2 wheels          Prior Functioning/Environment Level of Independence: Needs assistance  Gait / Transfers Assistance Needed: Recently had L BKA in April and has been using w/c - he was getting ready to get prosthetic. ADL's / Homemaking Assistance Needed: Using urinals at times; pt was performing basic ADLs but did have assist at times; had assist with IADLs            OT Problem List: Decreased strength;Decreased range of motion;Decreased activity tolerance;Impaired balance (sitting and/or standing);Decreased cognition;Decreased safety awareness;Decreased knowledge of use of DME or AE;Pain      OT Treatment/Interventions: Self-care/ADL training;Therapeutic exercise;DME and/or AE instruction;Therapeutic activities;Cognitive  remediation/compensation;Patient/family education;Balance training    OT Goals(Current goals can be found in the care plan section) Acute Rehab OT Goals Patient Stated Goal: to go home OT Goal Formulation: With patient Time For Goal Achievement: 05/11/21 Potential to Achieve Goals: Good  OT Frequency: Min 2X/week   Barriers to D/C:            Co-evaluation              AM-PAC OT "6 Clicks" Daily Activity     Outcome Measure Help from another person eating meals?: None Help from another person taking care of personal grooming?: A Little Help from another person toileting, which includes using toliet, bedpan, or urinal?: A Lot Help from another person bathing (including washing, rinsing, drying)?: A Lot Help from another person to put on and taking off regular upper body clothing?: A Little Help from another person to put on and taking off regular lower body clothing?: A Lot 6 Click  Score: 16   End of Session Nurse Communication: Other (comment) (DC concerns)  Activity Tolerance: Patient tolerated treatment well Patient left: in bed;with call bell/phone within reach;with bed alarm set  OT Visit Diagnosis: Other abnormalities of gait and mobility (R26.89);Muscle weakness (generalized) (M62.81);Pain;Other symptoms and signs involving cognitive function Pain - Right/Left: Right Pain - part of body: Leg                Time: 1350-1414 OT Time Calculation (min): 24 min Charges:  OT General Charges $OT Visit: 1 Visit OT Evaluation $OT Eval Moderate Complexity: 1 Mod OT Treatments $Self Care/Home Management : 8-22 mins  Luisa Dago, OT/L   Acute OT Clinical Specialist Acute Rehabilitation Services Pager [phone] Office [phone]   [facility] 04/27/2021, 4:04 PM

## 2021-04-27 NOTE — Progress Notes (Signed)
Contacted [name] Persons, PA to notify of decreased BP and pulse rate and to clarify medication orders.  PA states to hold Amiodarone, Coreg, Spirnalactone, and Torsemide.  PA is entering order to begin one unit PRBCs.

## 2021-04-27 NOTE — Plan of Care (Signed)

## 2021-04-27 NOTE — Plan of Care (Signed)

## 2021-04-28 ENCOUNTER — Ambulatory Visit: Payer: Medicaid Other | Admitting: Physician Assistant

## 2021-04-28 LAB — GLUCOSE, CAPILLARY
Glucose-Capillary: 248 mg/dL — ABNORMAL HIGH (ref 70–99)
Glucose-Capillary: 129 mg/dL — ABNORMAL HIGH (ref 70–99)
Glucose-Capillary: 238 mg/dL — ABNORMAL HIGH (ref 70–99)
Glucose-Capillary: 167 mg/dL — ABNORMAL HIGH (ref 70–99)

## 2021-04-28 LAB — CBC
HCT: 25.3 % — ABNORMAL LOW (ref 39.0–52.0)
Hemoglobin: 8 g/dL — ABNORMAL LOW (ref 13.0–17.0)
MCH: 27.5 pg (ref 26.0–34.0)
MCHC: 31.6 g/dL (ref 30.0–36.0)
MCV: 86.9 fL (ref 80.0–100.0)
Platelets: 575 10*3/uL — ABNORMAL HIGH (ref 150–400)
RBC: 2.91 MIL/uL — ABNORMAL LOW (ref 4.22–5.81)
RDW: 14.2 % (ref 11.5–15.5)
WBC: 14.4 10*3/uL — ABNORMAL HIGH (ref 4.0–10.5)
nRBC: 0 % (ref 0.0–0.2)

## 2021-04-28 LAB — BASIC METABOLIC PANEL
Anion gap: 7 (ref 5–15)
BUN: 37 mg/dL — ABNORMAL HIGH (ref 8–23)
CO2: 29 mmol/L (ref 22–32)
Calcium: 8.4 mg/dL — ABNORMAL LOW (ref 8.9–10.3)
Chloride: 96 mmol/L — ABNORMAL LOW (ref 98–111)
Creatinine, Ser: 1.21 mg/dL (ref 0.61–1.24)
GFR, Estimated: >60 mL/min (ref >60)
Glucose, Bld: 220 mg/dL — ABNORMAL HIGH (ref 70–99)
Potassium: 5.1 mmol/L (ref 3.5–5.1)
Sodium: 132 mmol/L — ABNORMAL LOW (ref 135–145)

## 2021-04-28 LAB — SURGICAL PATHOLOGY

## 2021-04-28 MED ORDER — OXYCODONE HCL 10 MG PO TABS: 10.0000 mg | ORAL_TABLET | ORAL | 0 refills | Status: DC | PRN

## 2021-04-28 NOTE — Plan of Care (Signed)
  Problem: Activity: Goal: Risk for activity intolerance will decrease Outcome: Progressing   Problem: Pain Managment: Goal: General experience of comfort will improve Outcome: Progressing   

## 2021-04-28 NOTE — TOC Progression Note (Signed)
Transition of Care [facility]) - Progression Note    Patient Details  Name: Scott Greer MRN: [id number] Date of Birth: [birth year]  Transition of [facility] CM/SW Contact  Ralene Bathe, LCSWA Phone Number: 04/28/2021, 11:41 AM  Clinical Narrative:     CSW contacted Malena Peer with [facility] to inquire about whether they could accept the patient for SNF.  Malena Peer is reviewing the referral and checking to see if the managed medicaid is in network with the facility.    CSW is awaiting a returned call about whether the facility can accept the patient.        Expected Discharge Plan and Services                                                 Social Determinants of Health (SDOH) Interventions    Readmission Risk Interventions Readmission Risk Prevention Plan 06/08/2019  Transportation Screening Complete  PCP or Specialist Appt within 5-7 Days Complete  Home Care Screening Complete  Medication Review (RN CM) Complete  Some recent data might be hidden

## 2021-04-28 NOTE — NC FL2 (Signed)
[place] MEDICAID FL2 LEVEL OF CARE SCREENING TOOL     IDENTIFICATION  Patient Name: Scott Greer Birthdate: 1956/05/04 Sex: male Admission Date (Current Location): 04/26/2021  [place] and [place] Number:  Haynes Bast [id number] Facility and Address:  [facility], [address] [place], [place] [address]      Provider Number: [id number]  Attending Physician Name and Address:  [name], MD  Relative Name and Phone Number:  [place], TAWANA (Sister)   [phone]    Current Level of Care: Hospital Recommended Level of Care: Skilled Nursing Facility Prior Approval Number:    Date Approved/Denied:   PASRR Number: [id number]  Discharge Plan: SNF    Current Diagnoses: Patient Active Problem List   Diagnosis Date Noted   Acute hematogenous osteomyelitis of right foot (HCC) 04/26/2021   Gangrene of right foot (HCC)    Foot pain, right 04/14/2021   Subacute osteomyelitis, right ankle and foot (HCC)    Status post below-knee amputation (HCC) 02/17/2021   Wound dehiscence    Gangrene of toe of left foot (HCC)    Cutaneous abscess of left foot    Left foot infection 02/07/2021   Leukocytosis 02/07/2021   Thrombocytosis 02/07/2021   Hyponatremia 02/07/2021   AKI (acute kidney injury) (HCC) 02/07/2021   Hyperglycemia due to diabetes mellitus (HCC) 02/07/2021   Hypoalbuminemia due to protein-calorie malnutrition (HCC) 02/07/2021   Osteomyelitis of second toe of left foot (HCC)    New onset of congestive heart failure (HCC) 12/26/2020   CKD (chronic kidney disease) stage 3, GFR 30-59 ml/min (HCC) 12/26/2020   Acute systolic CHF (congestive heart failure) (HCC) 12/26/2020   Anemia, chronic disease 06/22/2020   Amputee, great toe, right (HCC) 06/20/2020   Normocytic anemia 06/20/2020   Erectile dysfunction associated with type 2 diabetes mellitus (HCC) 06/20/2020   Positive for macroalbuminuria 10/24/2019   Amputation of left great toe (HCC)  10/23/2019   Tobacco dependence 10/23/2019   Osteomyelitis of great toe of right foot (HCC) 06/04/2019   Diabetic foot infection (HCC)    Noncompliance    Glaucoma    Hyperlipidemia    Essential hypertension 11/06/2003   Diabetes mellitus (HCC) 11/05/1997    Orientation RESPIRATION BLADDER Height & Weight     Self, Time, Situation, Place  Normal Continent Weight: 199 lb 15.3 oz (90.7 kg) Height:  6\' 3"  (190.5 cm)  BEHAVIORAL SYMPTOMS/MOOD NEUROLOGICAL BOWEL NUTRITION STATUS      Continent    AMBULATORY STATUS COMMUNICATION OF NEEDS Skin   Extensive Assist Verbally Surgical wounds                       Personal Care Assistance Level of Assistance  Bathing, Feeding, Dressing Bathing Assistance: Independent Feeding assistance: Independent Dressing Assistance: Limited assistance     Functional Limitations Info  Sight, Speech, Hearing Sight Info: Adequate Hearing Info: Adequate Speech Info: Adequate    SPECIAL CARE FACTORS FREQUENCY  PT (By licensed PT), OT (By licensed OT)     PT Frequency: 5x/week OT Frequency: 5x/week            Contractures Contractures Info: Not present    Additional Factors Info  Code Status, Allergies, Insulin Sliding Scale Code Status Info: Full Allergies Info: Bee Venom   Insulin Sliding Scale Info: see d/c med list       Current Medications (04/28/2021):  This is the current hospital active medication list Current Facility-Administered Medications  Medication Dose Route  Frequency Provider Last Rate Last Admin   0.9 %  sodium chloride infusion (Manually program via Guardrails IV Fluids)   Intravenous Once Persons, [name], [place]       0.9 %  sodium chloride infusion   Intravenous Continuous Persons, [name], [name] 75 mL/hr at 04/27/21 1352 Infusion Verify at 04/27/21 1352   acetaminophen (TYLENOL) tablet 325-650 mg  325-650 mg Oral Q6H PRN Persons, [name], [name]   650 mg at 04/27/21 2032   alum & mag hydroxide-simeth  (MAALOX/MYLANTA) 200-200-20 MG/5ML suspension 15-30 mL  15-30 mL Oral Q2H PRN Persons, [name], [place]       amiodarone (PACERONE) tablet 200 mg  200 mg Oral Daily Persons, [name], [place]   200 mg at 04/28/21 7341   ascorbic acid (VITAMIN C) tablet 1,000 mg  1,000 mg Oral Daily Persons, [name], [place]   1,000 mg at 04/28/21 9379   aspirin chewable tablet 81 mg  81 mg Oral Daily Persons, [name], [place]   81 mg at 04/28/21 0810   atorvastatin (LIPITOR) tablet 10 mg  10 mg Oral Daily Persons, [name], [place]   10 mg at 04/28/21 0240   bisacodyl (DULCOLAX) EC tablet 5 mg  5 mg Oral Daily PRN Persons, [name], [place]       carvedilol (COREG) tablet 3.125 mg  3.125 mg Oral BID WC Persons, [name], [place]   3.125 mg at 04/28/21 9735   digoxin (LANOXIN) tablet 0.125 mg  0.125 mg Oral Q supper Persons, [name], [place]   0.125 mg at 04/27/21 1741   docusate sodium (COLACE) capsule 100 mg  100 mg Oral Daily Persons, [name], [place]   100 mg at 04/28/21 0810   empagliflozin (JARDIANCE) tablet 10 mg  10 mg Oral Q lunch Persons, [name], [place]   10 mg at 04/27/21 1322   ferrous sulfate tablet 325 mg  325 mg Oral Q breakfast Persons, [name], [place]   325 mg at 04/28/21 3299   gabapentin (NEURONTIN) capsule 300 mg  300 mg Oral TID WC Persons, [name], [place]   300 mg at 04/28/21 0811   guaiFENesin-dextromethorphan (ROBITUSSIN DM) 100-10 MG/5ML syrup 15 mL  15 mL Oral Q4H PRN Persons, [name], [place]       HYDROmorphone (DILAUDID) injection 0.5-1 mg  0.5-1 mg Intravenous Q4H PRN Persons, [name], [place]   0.5 mg at 04/27/21 2258   insulin aspart (novoLOG) injection 0-15 Units  0-15 Units Subcutaneous TID WC Persons, [name], [place]   5 Units at 04/28/21 0810   magnesium citrate solution 1 Bottle  1 Bottle Oral Once PRN Persons, [name], [place]       magnesium sulfate IVPB 2 g 50 mL  2 g Intravenous Daily PRN Persons, [name], [place]       metFORMIN (GLUCOPHAGE) tablet 500 mg  500 mg Oral BID WC Persons, [name], [place]   500 mg at 04/28/21 2426    nutrition supplement (JUVEN) (JUVEN) powder packet 1 packet  1 packet Oral BID BM Persons, [name], [place]   1 packet at 04/28/21 0809   ondansetron (ZOFRAN) injection 4 mg  4 mg Intravenous Q6H PRN Persons, [name], [place]       oxyCODONE (Oxy IR/ROXICODONE) immediate release tablet 10-15 mg  10-15 mg Oral Q4H PRN Persons, [name], [place]   15 mg at 04/28/21 8341   oxyCODONE (Oxy IR/ROXICODONE) immediate release tablet 5-10 mg  5-10 mg Oral  Q4H PRN Persons, [name], [name]   10 mg at 04/27/21 1343   pantoprazole (PROTONIX) EC tablet 40 mg  40 mg Oral Daily Persons, [name], [place]   40 mg at 04/28/21 0810   phenol (CHLORASEPTIC) mouth spray 1 spray  1 spray Mouth/Throat PRN Persons, [name], [place]       polyethylene glycol (MIRALAX / GLYCOLAX) packet 17 g  17 g Oral Daily PRN Persons, [name], [place]       sacubitril-valsartan (ENTRESTO) 24-26 mg per tablet  1 tablet Oral BID Persons, [name], [name]   1 tablet at 04/28/21 7829   spironolactone (ALDACTONE) tablet 25 mg  25 mg Oral Q breakfast Persons, [name], [place]   25 mg at 04/28/21 0810   torsemide (DEMADEX) tablet 20 mg  20 mg Oral Daily Persons, [name], [place]   20 mg at 04/28/21 0810   zinc sulfate capsule 220 mg  220 mg Oral Daily Persons, [name], [place]   220 mg at 04/28/21 5621     Discharge Medications: Please see discharge summary for a list of discharge medications.  Relevant Imaging Results:  Relevant Lab Results:   Additional Information SSN:  [id number]   COVID Vaccines:Pfizer COVID-19 Vaccine 06/24/2020 , 06/02/2020  Ralene Bathe, LCSWA

## 2021-04-28 NOTE — Progress Notes (Signed)
Inpatient Diabetes Program Recommendations  AACE/ADA: New Consensus Statement on Inpatient Glycemic Control (2015)  Target Ranges:  Prepandial:   less than 140 mg/dL      Peak postprandial:   less than 180 mg/dL (1-2 hours)      Critically ill patients:  140 - 180 mg/dL   Lab Results  Component Value Date   GLUCAP 248 (H) 04/28/2021   HGBA1C 6.7 (H) 04/15/2021    Review of Glycemic Control  Results for WYETT, NARINE (MRN [id number]) as of 04/28/2021 08:18  Ref. Range 04/27/2021 06:09 04/27/2021 11:31 04/27/2021 17:24 04/27/2021 22:57 04/28/2021 06:07  Glucose-Capillary Latest Ref Range: 70 - 99 mg/dL 166 (H) 060 (H) 045 (H) 210 (H) 248 (H)   Diabetes history: DM 2 Outpatient Diabetes medications: Metformin 500 mg bid, Jardiance 10 mg Daily Current orders for Inpatient glycemic control:  Novolog 0-15 units tid Metformin 500 mg bid Jardiance 10 mg Daily  A1c 6.7% on 6/11  Inpatient Diabetes Program Recommendations:    -  Consider adding Lantus 10 units  Thanks,  Christena Deem RN, MSN, BC-ADM Inpatient Diabetes Coordinator Team Pager [phone] (8a-5p)

## 2021-04-28 NOTE — Progress Notes (Signed)
Inpatient Rehabilitation Admissions Coordinator   We will sign off for SNF to be pursued.  Ottie Glazier, RN, MSN Rehab Admissions Coordinator [phone] 04/28/2021 12:00 PM

## 2021-04-28 NOTE — Progress Notes (Signed)
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    PT Comments    Pt making good progress.  He demonstrated anterior/posterior transfer with supervision into recliner; however, is not consistent with transfer ability (Performed anterior/posterior with nursing earlier to [facility] and had difficulty getting back to bed - suspect due to [facility] offering less support than recliner).  Continues to have good quad activation and ROM in R knee.  Reports increased pain but tolerating activity well.  Good awareness of precautions of resting with leg straight and wearing shrinker in order to get prosthetics in future.  Does have decreased safety awareness in regards to mobility and ability to function at home alone.  Continue to recommend SNF due to decreased support at home.     Follow Up Recommendations  SNF (if pt doesn't agree then will need max HH services and near 24 hr support)     Equipment Recommendations  Other (comment) (Drop arm w/c and drop arm bsc and possible EMS transport home)    Recommendations for Other Services       Precautions / Restrictions Precautions Precautions: Fall Other Brace: Residual limb protector Restrictions RLE Weight Bearing: Non weight bearing    Mobility  Bed Mobility Overal bed mobility: Needs Assistance Bed Mobility: Rolling;Supine to Sit;Sit to Supine Rolling: Supervision   Supine to sit: Supervision Sit to supine: Supervision        Transfers Overall transfer level: Needs assistance Equipment used: None Transfers: Licensed conveyancer transfers: Supervision   General transfer comment: Anterior/posterior  transfer to recliner and then back to bed with supervision for lines.  RN reports pt had difficulty/nearly falling when transferring back to bed earlier from [facility] when he attempted on his own. Pt reports difficulty b/c felt BSC was unsteady/less supportive.  BSC does offer less support than recliner.  Ambulation/Gait                 Stairs             Wheelchair Mobility    Modified Rankin (Stroke Patients Only)       Balance Overall balance assessment: Needs assistance Sitting-balance support: No upper extremity supported Sitting balance-Leahy Scale: Good         Standing balance comment: unable - bil BKA                            Cognition Arousal/Alertness: Awake/alert Behavior During Therapy: WFL for tasks assessed/performed Overall Cognitive Status: No family/caregiver present to determine baseline cognitive functioning                                 General Comments: Cognition is most likely baseline, however pt with impaired judgement and insight and awareness of how his deficits impact his ability to function. (example: pt reports w/c unable to fit into bathroom but states he can transfer to a chair in bathroom and then to a toilet like he did before, but before he was stand pivoting)      Exercises Amputee Exercises Quad  Sets: AROM;Both;10 reps;Supine Gluteal Sets: AROM;Both;10 reps;Supine Towel Squeeze: AROM;Both;10 reps;Supine Hip Extension: AROM;Right;10 reps;Sidelying Hip ABduction/ADduction: AROM;Right;10 reps;Sidelying Knee Flexion: AROM;Right;10 reps;Supine Knee Extension: AROM;Right;10 reps;Supine    General Comments General comments (skin integrity, edema, etc.): Educated on BKA exercises and provided handout.  Discussed that largest barrier to return home is lack of supervision/safety.  Pt reports has people nearby but no one to stay with him.      Pertinent Vitals/Pain Pain Assessment: 0-10 Pain Score: 6   Pain Location: R leg Pain Descriptors / Indicators: Discomfort;Throbbing Pain Intervention(s): Limited activity within patient's tolerance;Monitored during session;RN gave pain meds during session;Repositioned    Home Living                      Prior Function            PT Goals (current goals can now be found in the care plan section) Acute Rehab PT Goals Patient Stated Goal: to go home PT Goal Formulation: With patient Time For Goal Achievement: 05/11/21 Potential to Achieve Goals: Good Progress towards PT goals: Progressing toward goals    Frequency    Min 5X/week      PT Plan Current plan remains appropriate    Co-evaluation              AM-PAC PT "6 Clicks" Mobility   Outcome Measure  Help needed turning from your back to your side while in a flat bed without using bedrails?: None Help needed moving from lying on your back to sitting on the side of a flat bed without using bedrails?: A Little Help needed moving to and from a bed to a chair (including a wheelchair)?: A Little Help needed standing up from a chair using your arms (e.g., wheelchair or bedside chair)?: Total Help needed to walk in hospital room?: Total Help needed climbing 3-5 steps with a railing? : Total 6 Click Score: 13    End of Session Equipment Utilized During Treatment: Gait belt Activity Tolerance: Patient tolerated treatment well Patient left: with call bell/phone within reach;in bed;with bed alarm set Nurse Communication: Mobility status PT Visit Diagnosis: Muscle weakness (generalized) (M62.81);Difficulty in walking, not elsewhere classified (R26.2)     Time: 2023-3435 PT Time Calculation (min) (ACUTE ONLY): 26 min  Charges:  $Therapeutic Exercise: 8-22 mins $Therapeutic Activity: 8-22 mins                     Scott Greer, PT Acute Rehab Services Pager [phone] Scott Greer Rehab [phone]    Scott Greer 04/28/2021, 5:52 PM

## 2021-04-29 LAB — GLUCOSE, CAPILLARY
Glucose-Capillary: 204 mg/dL — ABNORMAL HIGH (ref 70–99)
Glucose-Capillary: 144 mg/dL — ABNORMAL HIGH (ref 70–99)
Glucose-Capillary: 124 mg/dL — ABNORMAL HIGH (ref 70–99)
Glucose-Capillary: 206 mg/dL — ABNORMAL HIGH (ref 70–99)

## 2021-04-29 LAB — SARS CORONAVIRUS 2 (TAT 6-24 HRS): SARS Coronavirus 2: NEGATIVE

## 2021-04-29 NOTE — Progress Notes (Signed)
Patient ID: Scott Greer, male   DOB: 1956/10/05, 65 y.o.   MRN: [id number] Patient without complaints awaiting for skilled nursing bed offer.  There is no drainage in the wound VAC canister prescriptions on the chart for Percocet.

## 2021-04-29 NOTE — Plan of Care (Signed)
Call bell within reach. Bed alarm on.   Problem: Clinical Measurements: Goal: Ability to maintain clinical measurements within normal limits will improve Outcome: Progressing Goal: Will remain free from infection Outcome: Progressing Goal: Diagnostic test results will improve Outcome: Progressing Goal: Respiratory complications will improve Outcome: Progressing Goal: Cardiovascular complication will be avoided Outcome: Progressing   Problem: Activity: Goal: Risk for activity intolerance will decrease Outcome: Progressing   Problem: Nutrition: Goal: Adequate nutrition will be maintained Outcome: Progressing   Problem: Pain Managment: Goal: General experience of comfort will improve Outcome: Progressing   Problem: Safety: Goal: Ability to remain free from injury will improve Outcome: Progressing   Problem: Skin Integrity: Goal: Risk for impaired skin integrity will decrease Outcome: Progressing

## 2021-04-29 NOTE — Plan of Care (Signed)
  Problem: Activity: Goal: Risk for activity intolerance will decrease Outcome: Progressing   Problem: Coping: Goal: Level of anxiety will decrease Outcome: Progressing   Problem: Pain Managment: Goal: General experience of comfort will improve Outcome: Progressing   Problem: Safety: Goal: Ability to remain free from injury will improve Outcome: Progressing   Problem: Skin Integrity: Goal: Risk for impaired skin integrity will decrease Outcome: Progressing   

## 2021-04-30 LAB — BPAM RBC
Blood Product Expiration Date: 202207122359
Unit Type and Rh: 6200

## 2021-04-30 LAB — TYPE AND SCREEN
ABO/RH(D): A POS
Antibody Screen: NEGATIVE
Unit division: 0

## 2021-04-30 LAB — GLUCOSE, CAPILLARY
Glucose-Capillary: 135 mg/dL — ABNORMAL HIGH (ref 70–99)
Glucose-Capillary: 328 mg/dL — ABNORMAL HIGH (ref 70–99)
Glucose-Capillary: 106 mg/dL — ABNORMAL HIGH (ref 70–99)
Glucose-Capillary: 165 mg/dL — ABNORMAL HIGH (ref 70–99)

## 2021-04-30 NOTE — Plan of Care (Signed)
  Problem: Activity: Goal: Risk for activity intolerance will decrease Outcome: Progressing   Problem: Coping: Goal: Level of anxiety will decrease Outcome: Progressing   Problem: Pain Managment: Goal: General experience of comfort will improve Outcome: Progressing   Problem: Safety: Goal: Ability to remain free from injury will improve Outcome: Progressing   Problem: Skin Integrity: Goal: Risk for impaired skin integrity will decrease Outcome: Progressing   

## 2021-04-30 NOTE — Plan of Care (Signed)

## 2021-04-30 NOTE — Progress Notes (Signed)
Patient ID: Scott Greer, male   DOB: 10/10/1956, 65 y.o.   MRN: [id number] Patient denies any problems he is sitting up in bed eating breakfast.  There is no drainage in the wound VAC canister.  Patient states he like to go home with a home health aide as well as a new wheelchair.  Will evaluate for this on Monday.

## 2021-05-01 ENCOUNTER — Ambulatory Visit: Payer: Self-pay

## 2021-05-01 LAB — GLUCOSE, CAPILLARY
Glucose-Capillary: 208 mg/dL — ABNORMAL HIGH (ref 70–99)
Glucose-Capillary: 138 mg/dL — ABNORMAL HIGH (ref 70–99)
Glucose-Capillary: 134 mg/dL — ABNORMAL HIGH (ref 70–99)
Glucose-Capillary: 202 mg/dL — ABNORMAL HIGH (ref 70–99)

## 2021-05-01 NOTE — TOC Progression Note (Signed)
Transition of Care [facility]) - Progression Note    Patient Details  Name: Scott Greer MRN: [id number] Date of Birth: 20-Sep-1956  Transition of Care [facility]) CM/SW Contact  Ralene Bathe, LCSWA Phone Number: 05/01/2021, 10:32 AM  Clinical Narrative:     CSW called Malena Peer with [facility] to follow up on SNF referral for this patient.  There was no answer.  CSW left a message requesting a returned call.    Placement difficulty due to patient having managed medicaid       Expected Discharge Plan and Services                                                 Social Determinants of Health (SDOH) Interventions    Readmission Risk Interventions Readmission Risk Prevention Plan 06/08/2019  Transportation Screening Complete  PCP or Specialist Appt within 5-7 Days Complete  Home Care Screening Complete  Medication Review (RN CM) Complete  Some recent data might be hidden

## 2021-05-01 NOTE — Progress Notes (Signed)
Patient is status post transtibial amputation.  He is alert and awake.  Vital signs stable afebrile he has 1 green check on the wound VAC 0 cc in the canister.  He is willing to wait today and see if a skilled nursing bed is available.  He does state however that if there is no hope of a skilled nursing bed today or tomorrow he would like to be discharged home.  I have spoken with the patient's case manager who has some concerns because his apartment is insect infested and not very Mining engineer.  If he did go home he would need a home health aide and a new wheelchair will await recommendations with social worker today.  He has a negative COVID test that was done on June 25 and has a prescription for pain medication in his chart

## 2021-05-01 NOTE — Progress Notes (Signed)
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    PT Comments    Pt found in bathroom on commode with armless w/c next to commode. Pt transferred on own from bed without bedrails down to w/c then onto commode. W/c was not left by the bed. Pt with decreased insight to safety, determined to go home, and perseverating on speaking with social worker regarding getting him help at home. Discussed proper ant/post transfer, pt return demonstrated returning to bed. Pt remains unsafe to d/c home, continue to recommend SNF upon d/c.    Follow Up Recommendations  SNF     Equipment Recommendations       Recommendations for Other Services       Precautions / Restrictions Precautions Precautions: Fall Precaution Comments: decreased insight to safety Required Braces or Orthoses: Other Brace Other Brace: Residual limb protector Restrictions Weight Bearing Restrictions: Yes RLE Weight Bearing: Non weight bearing    Mobility  Bed Mobility Overal bed mobility: Needs Assistance Bed Mobility: Supine to Sit;Sit to Supine     Supine to sit: Modified independent (Device/Increase time) Sit to supine: Modified independent (Device/Increase time)   General bed mobility comments: no difficulty    Transfers Overall transfer level: Needs assistance Equipment used: None Transfers: Licensed conveyancer transfers: Min guard   General transfer comment: pt found on commode in the bathroom with w/c without arm rests next to commode, pt states "yes of course I did it myself, I'm a grown man." when asked if he transferred himself to the w/c. PT did not leave a  w/c next to the bed, pt states "I figured out how to get it", Charity fundraiser Notified.... observed pt transferring back to bed. Pt aligned w/c up diagnol and was trying to do an ant/post transfer, v/c's to bring w/c flush with bed, pt states "I don't need to do it that way, but I will if you want" pt educated on safety and minimizing the gap between the w/c and bed  Ambulation/Gait             General Gait Details: bilat BKA   Stairs             Wheelchair Mobility    Modified Rankin (Stroke Patients Only)       Balance Overall balance assessment: Needs assistance Sitting-balance support: No upper extremity supported Sitting balance-Leahy Scale: Good Sitting balance - Comments: pt able to perform hygiene s/p bm on commode       Standing balance comment: unable - bil BKA                            Cognition Arousal/Alertness: Awake/alert Behavior During Therapy: WFL for tasks assessed/performed Overall Cognitive Status: No family/caregiver present to determine baseline cognitive functioning                                 General Comments: pt with decreased insight to deficits and safety. Pt appears to have plans for how he will manage at home however per chart pt's apt  is full of bugs/insects and he doesn't care for himself well, pt transferred self to bathroom in room by himself without calling for help      Exercises      General Comments        Pertinent Vitals/Pain Pain Assessment: 0-10 Pain Score: 0-No pain    Home Living                      Prior Function            PT Goals (current goals can now be found in the care plan section) Acute Rehab PT Goals Patient Stated Goal: go home Progress towards PT goals: Progressing toward goals    Frequency    Min 5X/week      PT Plan Current plan remains appropriate    Co-evaluation              AM-PAC PT "6 Clicks" Mobility   Outcome Measure  Help needed  turning from your back to your side while in a flat bed without using bedrails?: None Help needed moving from lying on your back to sitting on the side of a flat bed without using bedrails?: A Little Help needed moving to and from a bed to a chair (including a wheelchair)?: A Little Help needed standing up from a chair using your arms (e.g., wheelchair or bedside chair)?: Total Help needed to walk in hospital room?: Total Help needed climbing 3-5 steps with a railing? : Total 6 Click Score: 13    End of Session   Activity Tolerance: Patient tolerated treatment well Patient left: in bed;with call bell/phone within reach;with bed alarm set Nurse Communication:  (pt transferring and taking self to bathroom on own, as well as disconnecting the wound vac) PT Visit Diagnosis: Muscle weakness (generalized) (M62.81);Difficulty in walking, not elsewhere classified (R26.2)     Time: 1336-1400 PT Time Calculation (min) (ACUTE ONLY): 24 min  Charges:  $Therapeutic Activity: 23-37 mins                     [name], PT, DPT Acute Rehabilitation Services Pager #: [phone] Office #: [phone]    [name] 05/01/2021, 2:33 PM

## 2021-05-01 NOTE — TOC Progression Note (Signed)
Transition of Care [facility]) - Progression Note    Patient Details  Name: Scott Greer MRN: [id number] Date of Birth: 1956/06/07  Transition of Care [facility]) CM/SW Contact  Ralene Bathe, LCSWA Phone Number: 05/01/2021, 4:25 PM  Clinical Narrative:    CSW spoke with Revonda Standard at [facility] and [place] with [employer].  Neither are able to accept the patient's insurance.  CSW spoke with [facility].  They are willing to accept the patient. They are beginning insurance auth.    CSW informed the patient who stated that he would be willing to wait until tomorrow and d/c to the facility.  Attending and RN notified of the need for COVID test.        Expected Discharge Plan and Services                                                 Social Determinants of Health (SDOH) Interventions    Readmission Risk Interventions Readmission Risk Prevention Plan 06/08/2019  Transportation Screening Complete  PCP or Specialist Appt within 5-7 Days Complete  Home Care Screening Complete  Medication Review (RN CM) Complete  Some recent data might be hidden

## 2021-05-01 NOTE — Progress Notes (Signed)
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   OT comments  Pt progressing well, still limited by insight into his deficits and safety awareness. Upon arrival, pt frustrated that he is still in the hospital, worried about his finances and wanting to return home. Educated pt on home safety and concerns in relation to his wc not fitting into the BR & having 1 STE his home. Pt continuously reporting that his "home aides" will take him into the bathroom and into the home with "no problem," and was not an active participant in problem solving safe ways to transfer. Pt completed AP transfer from bed>chair with supervision A. He was set up with pen and paper to work on financial concerns. Pt benefits from continued OT acutely. D/c plan for SNF remains appropriate.    Follow Up Recommendations  SNF    Equipment Recommendations  Wheelchair (measurements OT);Wheelchair cushion (measurements OT)       Precautions / Restrictions Precautions Precautions: Fall Precaution Comments: decreased insight to safety Required Braces or Orthoses: Other Brace Other Brace: Residual limb protector Restrictions Weight Bearing Restrictions: Yes RLE Weight Bearing: Non weight bearing       Mobility Bed Mobility Overal bed mobility: Needs Assistance Bed Mobility: Supine to Sit     Supine to sit: Modified independent (Device/Increase time) Sit to supine: Modified independent (Device/Increase time)   General bed mobility comments: no difficulty    Transfers Overall transfer level: Needs assistance Equipment used: None Transfers: Licensed conveyancer  transfers: Min guard   General transfer comment: pt states "I dont need no help doing this" talking about transfers. Per chart notes pt has had difficulty with safe toilet transfers however pt declining to practice at this time.    Balance Overall balance assessment: Needs assistance Sitting-balance support: No upper extremity supported Sitting balance-Leahy Scale: Good Sitting balance - Comments: pt able to perform hygiene s/p bm on commode       Standing balance comment: unable - bil BKA         ADL either performed or assessed with clinical judgement   ADL Overall ADL's : Needs assistance/impaired             Toilet Transfer: Min guard;Anterior/posterior Statistician Details (indicate cue type and reason): Min Guard A for safety. Pt demonstrating good strength. Cues for maintaining NWB status         Functional mobility during ADLs: Min guard General ADL Comments: Pt worried about his finances and d/c plan. Educated pt about safe d.c planning and poor wc access of his home - pt replying on in home aides 2 hours a day to help him transfer in the bathroom and states that his neighbor can bump up his wc into the home      Cognition Arousal/Alertness: Awake/alert Behavior During Therapy: [facility] for tasks assessed/performed Overall Cognitive Status: No family/caregiver present to determine baseline cognitive functioning             General Comments: limited insight into deficits and safe/fucntional problem solving in regards to home set up and managing wc to bathroom and bed room.  General Comments VSS on RA. Pt required max vc for safety awareness and frustrated that he is still in teh hospital and does not understand why he functionally cannot d/c safely home. Set pt up with paper and pen to make phone calls about his finances    Pertinent Vitals/ Pain       Pain Assessment: Faces Pain Score: 0-No pain Faces Pain Scale: Hurts a little bit Pain  Location: R leg Pain Descriptors / Indicators: Discomfort;Throbbing Pain Intervention(s): Monitored during session   Frequency  Min 2X/week        Progress Toward Goals  OT Goals(current goals can now be found in the care plan section)  Progress towards OT goals: Progressing toward goals  Acute Rehab OT Goals Patient Stated Goal: go home OT Goal Formulation: With patient Time For Goal Achievement: 05/11/21 Potential to Achieve Goals: Good ADL Goals Pt Will Perform Lower Body Bathing: with modified independence;bed level Pt Will Perform Lower Body Dressing: with modified independence;sitting/lateral leans Pt Will Transfer to Toilet: with modified independence;bedside commode;anterior/posterior transfer Pt Will Perform Toileting - Clothing Manipulation and hygiene: with modified independence;sitting/lateral leans Additional ADL Goal #1: Pt will independently donn/doff R residual limbguard Additional ADL Goal #2: Pt will verbalize 3 strategies to reduce risk of falls  Plan Discharge plan remains appropriate       AM-PAC OT "6 Clicks" Daily Activity     Outcome Measure   Help from another person eating meals?: None Help from another person taking care of personal grooming?: A Little Help from another person toileting, which includes using toliet, bedpan, or urinal?: A Lot Help from another person bathing (including washing, rinsing, drying)?: A Lot Help from another person to put on and taking off regular upper body clothing?: A Little Help from another person to put on and taking off regular lower body clothing?: A Lot 6 Click Score: 16    End of Session    OT Visit Diagnosis: Other abnormalities of gait and mobility (R26.89);Muscle weakness (generalized) (M62.81);Pain;Other symptoms and signs involving cognitive function Pain - Right/Left: Right Pain - part of body: Leg   Activity Tolerance Patient tolerated treatment well   Patient Left in chair;with call bell/phone  within reach   Nurse Communication Mobility status        Time: 5974-1638 OT Time Calculation (min): 20 min  Charges: OT General Charges $OT Visit: 1 Visit OT Treatments $Self Care/Home Management : 8-22 mins    Tamiki Kuba A Latrell Reitan 05/01/2021, 3:21 PM

## 2021-05-01 NOTE — Plan of Care (Signed)

## 2021-05-02 ENCOUNTER — Other Ambulatory Visit: Payer: Self-pay | Admitting: *Deleted

## 2021-05-02 ENCOUNTER — Telehealth: Payer: Self-pay | Admitting: Internal Medicine

## 2021-05-02 ENCOUNTER — Encounter (HOSPITAL_COMMUNITY): Payer: Self-pay

## 2021-05-02 ENCOUNTER — Other Ambulatory Visit: Payer: Self-pay

## 2021-05-02 DIAGNOSIS — M869 Osteomyelitis, unspecified: Secondary | ICD-10-CM | POA: Diagnosis not present

## 2021-05-02 DIAGNOSIS — Z7689 Persons encountering health services in other specified circumstances: Secondary | ICD-10-CM | POA: Diagnosis not present

## 2021-05-02 DIAGNOSIS — S88911S Complete traumatic amputation of right lower leg, level unspecified, sequela: Secondary | ICD-10-CM | POA: Diagnosis not present

## 2021-05-02 LAB — GLUCOSE, CAPILLARY
Glucose-Capillary: 184 mg/dL — ABNORMAL HIGH (ref 70–99)
Glucose-Capillary: 209 mg/dL — ABNORMAL HIGH (ref 70–99)

## 2021-05-02 LAB — SARS CORONAVIRUS 2 (TAT 6-24 HRS): SARS Coronavirus 2: NEGATIVE

## 2021-05-02 NOTE — Telephone Encounter (Signed)
Copied from CRM [id number]. Topic: Referral - Status >> May 01, 2021  3:48 PM Uvaldo Rising, Ja-Kwan wrote: Reason for CRM: Pt stated he will be released from the hospital soon and he would like an order for home health aide to come out to his home to help with daily activities.

## 2021-05-02 NOTE — Progress Notes (Signed)
Report given to [facility] LPN at [facility]. All questions and concerns were fully addressed.

## 2021-05-02 NOTE — Progress Notes (Signed)
Patient ID: Scott Greer, male   DOB: 26-Sep-1956, 66 y.o.   MRN: [id number] Patient without complaints this morning anticipate discharge to skilled nursing today.  Patient is postoperative day 6.

## 2021-05-02 NOTE — Discharge Summary (Signed)
Discharge Diagnoses:  Active Problems:   Gangrene of right foot ([place])   Acute hematogenous osteomyelitis of right foot ([place])   Surgeries: Procedure(s): RIGHT BELOW KNEE AMPUTATION on 04/26/2021    Consultants:   Discharged Condition: Improved  Hospital Course: LUCILE DIDONATO is an 65 y.o. male who was admitted 04/26/2021 with a chief complaint of right foot osteomyelitis, with a final diagnosis of Gangrene Right Foot.  Patient was brought to the operating room on 04/26/2021 and underwent Procedure(s): RIGHT BELOW KNEE AMPUTATION.    Patient was given perioperative antibiotics:  Anti-infectives (From admission, onward)    Start     Dose/Rate Route Frequency Ordered Stop   04/27/21 0600  ceFAZolin (ANCEF) IVPB 2g/100 mL premix        2 g 200 mL/hr over 30 Minutes Intravenous On call to O.R. 04/26/21 1255 04/26/21 1520   04/26/21 2330  ceFAZolin (ANCEF) IVPB 2g/100 mL premix        2 g 200 mL/hr over 30 Minutes Intravenous Every 8 hours 04/26/21 1733 04/27/21 0711     .  Patient was given sequential compression devices, early ambulation, and aspirin for DVT prophylaxis.  Recent vital signs: Patient Vitals for the past 24 hrs:  BP Temp Temp src Pulse Resp SpO2  05/01/21 1933 104/67 98.3 F (36.8 C) Oral 66 15 100 %  05/01/21 1500 102/68 98.6 F (37 C) Oral 72 17 100 %  05/01/21 1147 114/75 98.4 F (36.9 C) Oral 69 18 100 %  .  Recent laboratory studies: No results found.  Discharge Medications:   Allergies as of 05/02/2021       Reactions   Bee Venom Hives, Itching, Swelling        Medication List     STOP taking these medications    doxycycline 100 MG tablet Commonly known as: VIBRA-TABS   nitroGLYCERIN 0.2 mg/hr patch Commonly known as: NITRODUR - Dosed in mg/24 hr   pentoxifylline 400 MG CR tablet Commonly known as: TRENTAL       TAKE these medications    carvedilol 3.125 MG tablet Commonly known as: COREG Take 1 tablet (3.125 mg total) by mouth 2  (two) times daily with a meal.   ferrous sulfate 325 (65 FE) MG tablet Take 1 tablet (325 mg total) by mouth daily with breakfast.   isosorbide-hydrALAZINE 20-37.5 MG tablet Commonly known as: BIDIL Take 1 tablet by mouth 3 (three) times daily with meals.   metFORMIN 500 MG tablet Commonly known as: GLUCOPHAGE TAKE ONE TABLET BY MOUTH TWICE DAILY WITH A MEAL   nutrition supplement (JUVEN) Pack Take 1 packet by mouth 2 (two) times daily between meals.   Oxycodone HCl 10 MG Tabs Take 1-1.5 tablets (10-15 mg total) by mouth every 4 (four) hours as needed for severe pain (pain score 7-10). What changed:  medication strength how much to take reasons to take this   torsemide 20 MG tablet Commonly known as: DEMADEX Take 1 tablet (20 mg total) by mouth daily.   True Metrix Blood Glucose Test test strip Generic drug: glucose blood Use as instructed   True Metrix Meter w/Device Kit Use as directed   TRUEplus Lancets 28G Misc Use as directed   zinc sulfate 220 (50 Zn) MG capsule Take 1 capsule (220 mg total) by mouth daily.       ASK your doctor about these medications    amiodarone 200 MG tablet Commonly known as: PACERONE Take 1 tablet (200 mg total) by  mouth daily.   aspirin 81 MG chewable tablet Chew 1 tablet (81 mg total) by mouth daily.   atorvastatin 10 MG tablet Commonly known as: Lipitor Take 1 tablet (10 mg total) by mouth daily.   digoxin 0.125 MG tablet Commonly known as: LANOXIN Take 1 tablet (0.125 mg total) by mouth daily.   empagliflozin 10 MG Tabs tablet Commonly known as: JARDIANCE Take 1 tablet (10 mg total) by mouth daily.   gabapentin 300 MG capsule Commonly known as: NEURONTIN TAKE ONE CAPSULE BY MOUTH THREE TIMES DAILY   potassium chloride SA 20 MEQ tablet Commonly known as: KLOR-CON TAKE 1 TABLET (20 MEQ) BY MOUTH DAILY.   sacubitril-valsartan 24-26 MG Commonly known as: ENTRESTO Take 1 tablet by mouth 2 (two) times daily.    spironolactone 25 MG tablet Commonly known as: ALDACTONE TAKE 1 TABLET (25 MG TOTAL) BY MOUTH DAILY.   Vitamin D (Ergocalciferol) 1.25 MG (50000 UNIT) Caps capsule Commonly known as: DRISDOL Take 1 capsule (50,000 Units total) by mouth every 7 (seven) days.        Diagnostic Studies: No results found.  Patient benefited maximally from their hospital stay and there were no complications.     Disposition: Discharge disposition: 01-Home or Self Care      Discharge Instructions     Call MD / Call 911   Complete by: As directed    If you experience chest pain or shortness of breath, CALL 911 and be transported to the hospital emergency room.  If you develope a fever above 101 F, pus (white drainage) or increased drainage or redness at the wound, or calf pain, call your surgeon's office.   Constipation Prevention   Complete by: As directed    Drink plenty of fluids.  Prune juice may be helpful.  You may use a stool softener, such as Colace (over the counter) 100 mg twice a day.  Use MiraLax (over the counter) for constipation as needed.   Diet - low sodium heart healthy   Complete by: As directed    Discharge instructions   Complete by: As directed    Show patient how to attach preveena vac. Should call office if alarms   Increase activity slowly as tolerated   Complete by: As directed    Negative Pressure Wound Therapy - Incisional   Complete by: As directed    Show patient how to attach preveena vac   Post-operative opioid taper instructions:   Complete by: As directed    POST-OPERATIVE OPIOID TAPER INSTRUCTIONS: It is important to wean off of your opioid medication as soon as possible. If you do not need pain medication after your surgery it is ok to stop day one. Opioids include: Codeine, Hydrocodone(Norco, Vicodin), Oxycodone(Percocet, oxycontin) and hydromorphone amongst others.  Long term and even short term use of opiods can cause: Increased pain  response Dependence Constipation Depression Respiratory depression And more.  Withdrawal symptoms can include Flu like symptoms Nausea, vomiting And more Techniques to manage these symptoms Hydrate well Eat regular healthy meals Stay active Use relaxation techniques(deep breathing, meditating, yoga) Do Not substitute Alcohol to help with tapering If you have been on opioids for less than two weeks and do not have pain than it is ok to stop all together.  Plan to wean off of opioids This plan should start within one week post op of your joint replacement. Maintain the same interval or time between taking each dose and first decrease the dose.  Cut the  total daily intake of opioids by one tablet each day Next start to increase the time between doses. The last dose that should be eliminated is the evening dose.          Follow-up Information     [name], NP Follow up in 1 week(s).   Specialty: Orthopedic Surgery Contact information: [address] [place] [address] [phone]                  Signed: [name] Tsuyako Jolley 05/02/2021, 7:29 AM

## 2021-05-02 NOTE — Progress Notes (Signed)
Discharge summary packet/pertinent documents provdied to PTAR. Pt remains alert/oriented in no apparent distress. Pt has no complaints.. Pt d/c to [facility] as ordered.

## 2021-05-02 NOTE — TOC Transition Note (Signed)
Transition of Care [facility]) - CM/SW Discharge Note   Patient Details  Name: KIREE DEJARNETTE MRN: [id number] Date of Birth: 05-23-1956  Transition of Care [facility]) CM/SW Contact:  Ralene Bathe, LCSWA Phone Number: 05/02/2021, 10:07 AM   Clinical Narrative:    Patient will DC to: [facility] Anticipated DC date: 05/02/2021 Transport by: Sharin Mons   Per MD patient ready for DC to SNF. RN to call report prior to discharge [phone]. RN, patient, and facility notified of DC. Discharge Summary and FL2 sent to facility. DC packet on chart. Ambulance transport will be requested for patient when RN informs CSW that patient is ready.   CSW will sign off for now as social work intervention is no longer needed. Please consult [place] again if new needs arise.     Final next level of care: Skilled Nursing Facility Barriers to Discharge: Barriers Resolved   Patient Goals and CMS Choice        Discharge Placement              Patient chooses bed at: [facility] Patient to be transferred to facility by: [facility] Name of family member notified: [place], TAWANA (Sister)   [phone] Patient and family notified of of transfer: 05/02/21  Discharge Plan and Services                                     Social Determinants of Health (SDOH) Interventions     Readmission Risk Interventions Readmission Risk Prevention Plan 06/08/2019  Transportation Screening Complete  PCP or Specialist Appt within 5-7 Days Complete  Home Care Screening Complete  Medication Review (RN CM) Complete  Some recent data might be hidden

## 2021-05-02 NOTE — Patient Outreach (Signed)
Care Coordination  05/02/2021  Scott Greer 1956-07-13 [id number]  Chart opened in error.  Estanislado Emms RN, BSN [place]  [employer]

## 2021-05-02 NOTE — Progress Notes (Signed)
Paramedicine Encounter    Patient ID: Scott Greer, male    DOB: Feb 07, 1956, 65 y.o.   MRN: [id number]  Noted that Mr. Spatafore was discharged to a skilled nursing facility. Patient will be discharged from paramedicine program until seen in [facility] again for re-enrollment.   Discharged 05/02/21.      ACTION: Home visit completed

## 2021-05-03 ENCOUNTER — Other Ambulatory Visit: Payer: Self-pay

## 2021-05-03 NOTE — Patient Outreach (Signed)
Patient called me back, affirmed he is in a SNF but stated he would like to leave and is trying to go home tomorrow. Doesn't need meds, affirmed they are giving him enough. Will F/U in 3 weeks to double check on him. If he's Dc'd, will bring him back into MM team  Patient seemed very sad about being in SNF and repeated many times, "I'm really glad you called to check in on me."

## 2021-05-03 NOTE — Patient Outreach (Signed)
Removed myself from Care Team and completed Care Plans per Melanie's note regarding, "SNF placement [facility]"

## 2021-05-05 ENCOUNTER — Telehealth: Payer: Self-pay | Admitting: Internal Medicine

## 2021-05-05 ENCOUNTER — Emergency Department (HOSPITAL_COMMUNITY)
Admission: EM | Admit: 2021-05-05 | Discharge: 2021-05-06 | Disposition: A | Discharge status: Home / Self Care | Payer: Medicaid Other | Attending: Emergency Medicine | Admitting: Emergency Medicine

## 2021-05-05 ENCOUNTER — Other Ambulatory Visit: Payer: Self-pay

## 2021-05-05 ENCOUNTER — Encounter (HOSPITAL_COMMUNITY): Payer: Self-pay | Admitting: *Deleted

## 2021-05-05 DIAGNOSIS — Z89511 Acquired absence of right leg below knee: Secondary | ICD-10-CM

## 2021-05-05 DIAGNOSIS — I5021 Acute systolic (congestive) heart failure: Secondary | ICD-10-CM | POA: Diagnosis not present

## 2021-05-05 DIAGNOSIS — I130 Hypertensive heart and chronic kidney disease with heart failure and stage 1 through stage 4 chronic kidney disease, or unspecified chronic kidney disease: Secondary | ICD-10-CM | POA: Diagnosis not present

## 2021-05-05 DIAGNOSIS — M79672 Pain in left foot: Secondary | ICD-10-CM | POA: Diagnosis not present

## 2021-05-05 DIAGNOSIS — Z419 Encounter for procedure for purposes other than remedying health state, unspecified: Secondary | ICD-10-CM | POA: Diagnosis not present

## 2021-05-05 DIAGNOSIS — E1122 Type 2 diabetes mellitus with diabetic chronic kidney disease: Secondary | ICD-10-CM | POA: Insufficient documentation

## 2021-05-05 DIAGNOSIS — Z79899 Other long term (current) drug therapy: Secondary | ICD-10-CM | POA: Diagnosis not present

## 2021-05-05 DIAGNOSIS — Z7984 Long term (current) use of oral hypoglycemic drugs: Secondary | ICD-10-CM | POA: Diagnosis not present

## 2021-05-05 DIAGNOSIS — N183 Chronic kidney disease, stage 3 unspecified: Secondary | ICD-10-CM | POA: Diagnosis not present

## 2021-05-05 DIAGNOSIS — G8918 Other acute postprocedural pain: Secondary | ICD-10-CM

## 2021-05-05 DIAGNOSIS — Z9181 History of falling: Secondary | ICD-10-CM

## 2021-05-05 DIAGNOSIS — F17210 Nicotine dependence, cigarettes, uncomplicated: Secondary | ICD-10-CM | POA: Diagnosis not present

## 2021-05-05 DIAGNOSIS — M7989 Other specified soft tissue disorders: Secondary | ICD-10-CM | POA: Diagnosis not present

## 2021-05-05 DIAGNOSIS — Z7982 Long term (current) use of aspirin: Secondary | ICD-10-CM | POA: Insufficient documentation

## 2021-05-05 DIAGNOSIS — E13621 Other specified diabetes mellitus with foot ulcer: Secondary | ICD-10-CM

## 2021-05-05 NOTE — Telephone Encounter (Signed)
Will forward to provider  

## 2021-05-05 NOTE — ED Triage Notes (Signed)
The pt had  surgery on his  rt leg his rt lower leg was amputated a week ago  the wound vac that was attached has stopped  working and since  yesterday ?? He is rambling talking about everything else  the stump has extreme swelling

## 2021-05-05 NOTE — Telephone Encounter (Signed)
Jon Gills, from [facility], calling stating that the pt is in need of hh services. She states that the pt has been admitted into the hospital and had second leg amputated. S/he states that he was discharged on 05/02/21 to a rehab facility and left AMA on 05/03/21. She states that the pt is home with no wound care, PT, and some equipment. Please advise.

## 2021-05-06 ENCOUNTER — Emergency Department (HOSPITAL_COMMUNITY): Payer: Medicaid Other

## 2021-05-06 DIAGNOSIS — Z89511 Acquired absence of right leg below knee: Secondary | ICD-10-CM | POA: Diagnosis not present

## 2021-05-06 DIAGNOSIS — M79672 Pain in left foot: Secondary | ICD-10-CM | POA: Diagnosis not present

## 2021-05-06 DIAGNOSIS — Z7689 Persons encountering health services in other specified circumstances: Secondary | ICD-10-CM | POA: Diagnosis not present

## 2021-05-06 DIAGNOSIS — M7989 Other specified soft tissue disorders: Secondary | ICD-10-CM | POA: Diagnosis not present

## 2021-05-06 DIAGNOSIS — G8918 Other acute postprocedural pain: Secondary | ICD-10-CM | POA: Diagnosis not present

## 2021-05-06 LAB — URINALYSIS, ROUTINE W REFLEX MICROSCOPIC
Bilirubin Urine: NEGATIVE
Glucose, UA: >=500 mg/dL — AB
Hgb urine dipstick: NEGATIVE
Ketones, ur: NEGATIVE mg/dL
Nitrite: NEGATIVE
Protein, ur: 100 mg/dL — AB
Specific Gravity, Urine: 1.015 (ref 1.005–1.030)
WBC, UA: >50 WBC/hpf — ABNORMAL HIGH (ref 0–5)
pH: 7 (ref 5.0–8.0)

## 2021-05-06 LAB — COMPREHENSIVE METABOLIC PANEL
ALT: 14 U/L (ref 0–44)
AST: 19 U/L (ref 15–41)
Albumin: 2.8 g/dL — ABNORMAL LOW (ref 3.5–5.0)
Alkaline Phosphatase: 85 U/L (ref 38–126)
Anion gap: 10 (ref 5–15)
BUN: 36 mg/dL — ABNORMAL HIGH (ref 8–23)
CO2: 31 mmol/L (ref 22–32)
Calcium: 9.2 mg/dL (ref 8.9–10.3)
Chloride: 95 mmol/L — ABNORMAL LOW (ref 98–111)
Creatinine, Ser: 1.31 mg/dL — ABNORMAL HIGH (ref 0.61–1.24)
GFR, Estimated: >60 mL/min (ref >60)
Glucose, Bld: 158 mg/dL — ABNORMAL HIGH (ref 70–99)
Potassium: 5 mmol/L (ref 3.5–5.1)
Sodium: 136 mmol/L (ref 135–145)
Total Bilirubin: 0.4 mg/dL (ref 0.3–1.2)
Total Protein: 6.7 g/dL (ref 6.5–8.1)

## 2021-05-06 LAB — CBC
HCT: 23.5 % — ABNORMAL LOW (ref 39.0–52.0)
Hemoglobin: 7.5 g/dL — ABNORMAL LOW (ref 13.0–17.0)
MCH: 28.1 pg (ref 26.0–34.0)
MCHC: 31.9 g/dL (ref 30.0–36.0)
MCV: 88 fL (ref 80.0–100.0)
Platelets: 619 10*3/uL — ABNORMAL HIGH (ref 150–400)
RBC: 2.67 MIL/uL — ABNORMAL LOW (ref 4.22–5.81)
RDW: 15.2 % (ref 11.5–15.5)
WBC: 12.8 10*3/uL — ABNORMAL HIGH (ref 4.0–10.5)
nRBC: 0 % (ref 0.0–0.2)

## 2021-05-06 MED ORDER — HYDROMORPHONE HCL 1 MG/ML IJ SOLN
0.5000 mg | Freq: Once | INTRAMUSCULAR | Status: AC
  Administered 2021-05-06: 0.5 mg via INTRAMUSCULAR
  Filled 2021-05-06: qty 1

## 2021-05-06 MED ORDER — HYDROMORPHONE HCL 1 MG/ML IJ SOLN
1.0000 mg | Freq: Once | INTRAMUSCULAR | Status: AC
  Administered 2021-05-06: 1 mg via INTRAMUSCULAR
  Filled 2021-05-06: qty 1

## 2021-05-06 MED ORDER — OXYCODONE HCL 5 MG PO TABS: 10.0000 mg | ORAL_TABLET | Freq: Once | ORAL | Status: DC

## 2021-05-06 NOTE — ED Provider Notes (Signed)
[facility] EMERGENCY DEPARTMENT Provider Note   CSN: [id number] Arrival date & time: 05/05/21  2219     History Chief Complaint  Patient presents with   Post-op Problem    Scott Greer is a 65 y.o. male.  65 year old male with past medical problems diabetes who had a recent BKA.  States that his wound VAC stopped working he is here because of that and pain.  He thinks the pains from swelling because of wound VAC not working and has concerned about being infection there however has not any fever or any other infectious symptoms.  Has his wound VAC supplies with him.  No recent trauma.       Past Medical History:  Diagnosis Date   CHF (congestive heart failure) ([place])    Dehiscence of amputation stump ([place])    left great toe   Diabetes mellitus without complication ([employer]) 7867   Type II   Glaucoma 2015   Hyperlipidemia    Hypertension 2005   Left foot infection 02/07/2021   Osteomyelitis ([place])    Wears glasses     Patient Active Problem List   Diagnosis Date Noted   Acute hematogenous osteomyelitis of right foot ([place]) 04/26/2021   Gangrene of right foot ([place])    Foot pain, right 04/14/2021   Subacute osteomyelitis, right ankle and foot ([place])    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   Wound dehiscence    Gangrene of toe of left foot ([place])    Cutaneous abscess of left foot    Left foot infection 02/07/2021   Leukocytosis 02/07/2021   Thrombocytosis 02/07/2021   Hyponatremia 02/07/2021   AKI (acute kidney injury) ([place]) 02/07/2021   Hyperglycemia due to diabetes mellitus ([place]) 02/07/2021   Hypoalbuminemia due to protein-calorie malnutrition ([place]) 02/07/2021   Osteomyelitis of second toe of left foot ([place])    New onset of congestive heart failure ([place]) 12/26/2020   CKD (chronic kidney disease) stage 3, GFR 30-59 ml/min (HCC) 67/20/9470   Acute systolic CHF (congestive heart failure) ([place]) 12/26/2020   Anemia, chronic disease 06/22/2020    Amputee, great toe, right ([place]) 06/20/2020   Normocytic anemia 06/20/2020   Erectile dysfunction associated with type 2 diabetes mellitus ([place]) 06/20/2020   Positive for macroalbuminuria 10/24/2019   Amputation of left great toe ([place]) 10/23/2019   Tobacco dependence 10/23/2019   Osteomyelitis of great toe of right foot ([place]) 06/04/2019   Diabetic foot infection ([place])    Noncompliance    Glaucoma    Hyperlipidemia    Essential hypertension 11/06/2003   Diabetes mellitus ([place]) 11/05/1997    Past Surgical History:  Procedure Laterality Date   AMPUTATION Left 06/05/2019   Procedure: LEFT GREAT TOE AMPUTATION;  Surgeon: Newt Minion, MD;  Location: [place];  Service: Orthopedics;  Laterality: Left;   AMPUTATION Left 07/10/2019   Procedure: LEFT FOOT 1ST RAY AMPUTATION;  Surgeon: Newt Minion, MD;  Location: [place];  Service: Orthopedics;  Laterality: Left;   AMPUTATION Right 05/25/2020   Procedure: RIGHT GREAT TOE AMPUTATION;  Surgeon: Newt Minion, MD;  Location: [place];  Service: Orthopedics;  Laterality: Right;   AMPUTATION Left 01/25/2021   Procedure: LEFT 2ND TOE AMPUTATION;  Surgeon: Newt Minion, MD;  Location: [place];  Service: Orthopedics;  Laterality: Left;   AMPUTATION Left 02/08/2021   Procedure: LEFT TRANSMETATARSAL AMPUTATION;  Surgeon: Newt Minion, MD;  Location: [place];  Service: Orthopedics;  Laterality: Left;   AMPUTATION  Left 02/10/2021   Procedure: AMPUTATION BELOW KNEE;  Surgeon: Newt Minion, MD;  Location: [place];  Service: Orthopedics;  Laterality: Left;   AMPUTATION Right 04/14/2021   Procedure: RIGHT 1ST AND 2ND RAY AMPUTATION;  Surgeon: Newt Minion, MD;  Location: [place];  Service: Orthopedics;  Laterality: Right;   AMPUTATION Right 04/26/2021   Procedure: RIGHT BELOW KNEE AMPUTATION;  Surgeon: Newt Minion, MD;  Location: [place];  Service: Orthopedics;  Laterality: Right;   NO PAST SURGERIES     RIGHT/LEFT HEART CATH AND CORONARY ANGIOGRAPHY N/A 01/02/2021    Procedure: RIGHT/LEFT HEART CATH AND CORONARY ANGIOGRAPHY;  Surgeon: Lorretta Harp, MD;  Location: [place] [facility];  Service: Cardiovascular;  Laterality: N/A;       Family History  Problem Relation Age of Onset   Stroke Mother    Diabetes Mother        Toward end of life    Social History   Tobacco Use   Smoking status: Every Day    Pack years: 0.00    Types: Cigars, Cigarettes   Smokeless tobacco: Former    Types: Chew   Tobacco comments:    6 daily  Vaping Use   Vaping Use: Never used  Substance Use Topics   Alcohol use: Yes    Alcohol/week: 11.0 standard drinks    Types: 3 Cans of beer, 8 Shots of liquor per week    Comment: occasional   Drug use: Yes    Types: Marijuana    Comment: ocassional - last time 05/08/2020    Home Medications Prior to Admission medications   Medication Sig Start Date End Date Taking? Authorizing Provider  amiodarone (PACERONE) 200 MG tablet Take 1 tablet (200 mg total) by mouth daily. Patient taking differently: Take 200 mg by mouth daily with lunch. 02/27/21   [name], MD  aspirin 81 MG chewable tablet Chew 1 tablet (81 mg total) by mouth daily. Patient taking differently: Chew 81 mg by mouth daily with breakfast. 02/27/21   [name], MD  atorvastatin (LIPITOR) 10 MG tablet Take 1 tablet (10 mg total) by mouth daily. Patient taking differently: Take 10 mg by mouth daily with supper. 02/27/21   [name], MD  Blood Glucose Monitoring Suppl (TRUE METRIX METER) w/Device KIT Use as directed 10/23/19   [name], MD  carvedilol (COREG) 3.125 MG tablet Take 1 tablet (3.125 mg total) by mouth 2 (two) times daily with a meal. 02/27/21 05/28/21  [name], MD  digoxin (LANOXIN) 0.125 MG tablet Take 1 tablet (0.125 mg total) by mouth daily. Patient taking differently: Take 0.125 mg by mouth daily with supper. 02/27/21   [name], MD  empagliflozin (JARDIANCE) 10 MG TABS tablet Take 1 tablet (10 mg  total) by mouth daily. Patient taking differently: Take 10 mg by mouth daily with lunch. 02/27/21   [name], MD  ferrous sulfate 325 (65 FE) MG tablet Take 1 tablet (325 mg total) by mouth daily with breakfast. 04/08/21   [name], MD  gabapentin (NEURONTIN) 300 MG capsule TAKE ONE CAPSULE BY MOUTH THREE TIMES DAILY Patient taking differently: Take 300 mg by mouth 3 (three) times daily with meals. 04/04/21   [name], MD  glucose blood (TRUE METRIX BLOOD GLUCOSE TEST) test strip Use as instructed 10/23/19   [name], MD  isosorbide-hydrALAZINE (BIDIL) 20-37.5 MG tablet Take 1 tablet by mouth 3 (three) times  daily with meals.    [provider]  metFORMIN (GLUCOPHAGE) 500 MG tablet TAKE ONE TABLET BY MOUTH TWICE DAILY WITH A MEAL 04/24/21   [name], MD  nutrition supplement, JUVEN, (JUVEN) PACK Take 1 packet by mouth 2 (two) times daily between meals. 04/17/21   Persons, [name], PA  oxyCODONE 10 MG TABS Take 1-1.5 tablets (10-15 mg total) by mouth every 4 (four) hours as needed for severe pain (pain score 7-10). 04/28/21   Persons, [name], PA  potassium chloride SA (KLOR-CON) 20 MEQ tablet TAKE 1 TABLET (20 MEQ) BY MOUTH DAILY. Patient taking differently: Take 20 mEq by mouth daily with breakfast. 02/27/21 02/27/22  [name], MD  sacubitril-valsartan (ENTRESTO) 24-26 MG Take 1 tablet by mouth 2 (two) times daily. Patient taking differently: Take 1 tablet by mouth 2 (two) times daily with a meal. 02/27/21 05/28/21  [name], MD  spironolactone (ALDACTONE) 25 MG tablet TAKE 1 TABLET (25 MG TOTAL) BY MOUTH DAILY. Patient taking differently: Take 25 mg by mouth daily with breakfast. 02/27/21 02/27/22  [name], MD  torsemide (DEMADEX) 20 MG tablet Take 1 tablet (20 mg total) by mouth daily. 02/27/21   [name], MD  TRUEplus Lancets 28G MISC Use as directed 10/23/19   [name], MD  Vitamin D, Ergocalciferol,  (DRISDOL) 1.25 MG (50000 UNIT) CAPS capsule Take 1 capsule (50,000 Units total) by mouth every 7 (seven) days. Patient taking differently: Take 50,000 Units by mouth every Sunday. 03/07/21   [name], MD  zinc sulfate 220 (50 Zn) MG capsule Take 1 capsule (220 mg total) by mouth daily. 04/17/21   Persons, [name], PA    Allergies    Bee venom  Review of Systems   Review of Systems  All other systems reviewed and are negative.  Physical Exam Updated Vital Signs BP 136/78   Pulse 68   Temp 97.9 F (36.6 C) (Oral)   Resp 14   Ht 6' 3" (1.905 m)   Wt 97.9 kg   SpO2 100%   BMI 26.98 kg/m   Physical Exam Vitals and nursing note reviewed.  Constitutional:      Appearance: He is well-developed.  HENT:     Head: Normocephalic and atraumatic.     Mouth/Throat:     Mouth: Mucous membranes are moist.     Pharynx: Oropharynx is clear.  Eyes:     Pupils: Pupils are equal, round, and reactive to light.  Cardiovascular:     Rate and Rhythm: Normal rate.  Pulmonary:     Effort: Pulmonary effort is normal. No respiratory distress.  Abdominal:     General: Abdomen is flat. There is no distension.  Musculoskeletal:        General: Deformity (Bilateral BKA's.  Wound VAC in place on the right stump without any obvious leaks.) present. Normal range of motion.     Cervical back: Normal range of motion.  Skin:    General: Skin is warm and dry.  Neurological:     General: No focal deficit present.     Mental Status: He is alert.    ED Results / Procedures / Treatments   Labs (all labs ordered are listed, but only abnormal results are displayed) Labs Reviewed  COMPREHENSIVE METABOLIC PANEL - Abnormal; Notable for the following components:      Result Value   Chloride 95 (*)    Glucose, Bld 158 (*)  BUN 36 (*)    Creatinine, Ser 1.31 (*)    Albumin 2.8 (*)    All other components within normal limits  CBC - Abnormal; Notable for the following components:   WBC 12.8 (*)     RBC 2.67 (*)    Hemoglobin 7.5 (*)    HCT 23.5 (*)    Platelets 619 (*)    All other components within normal limits  URINALYSIS, ROUTINE W REFLEX MICROSCOPIC - Abnormal; Notable for the following components:   APPearance HAZY (*)    Glucose, UA >=500 (*)    Protein, ur 100 (*)    Leukocytes,Ua LARGE (*)    WBC, UA >50 (*)    Bacteria, UA RARE (*)    All other components within normal limits    EKG None  Radiology DG Knee Complete 4 Views Right  Result Date: 05/06/2021 CLINICAL DATA:  Below the knee amputation 1 week ago with swelling in the stump. EXAM: RIGHT KNEE - COMPLETE 4+ VIEW COMPARISON:  None. FINDINGS: Postoperative changes with right below the knee amputation. Skin clips are present consistent with recent surgery. Margin of resection appears intact without evidence of erosion or sclerosis. Soft tissues are unremarkable. No radiopaque soft tissue foreign body or gas is identified. Mild degenerative changes in the knee with medial compartment narrowing. No significant effusions. IMPRESSION: Postoperative below the knee amputation without apparent radiographic complication. Electronically Signed   By: Lucienne Capers M.D.   On: 05/06/2021 00:49    Procedures Procedures   Medications Ordered in ED Medications  oxyCODONE (Oxy IR/ROXICODONE) immediate release tablet 10 mg (has no administration in time range)    ED Course  I have reviewed the triage vital signs and the nursing notes.  Pertinent labs & imaging results that were available during my care of the patient were reviewed by me and considered in my medical decision making (see chart for details).    MDM Rules/Calculators/A&P                          Wound VAC was trouble shot and does appear to be working appropriately now.  He still has pain but is also been here for over 8 hours without any pain medicine.  I do not think he has infection without a fever or leukocytosis I do not see any indication to pull the  wound VAC down now.  We will have him follow-up with his surgeon after we give him some pain medicine.  Final Clinical Impression(s) / ED Diagnoses Final diagnoses:  None    Rx / DC Orders ED Discharge Orders     None        Mesner, Corene Cornea, MD 05/06/21 [phone]

## 2021-05-06 NOTE — ED Notes (Signed)
Breakfast tray ordered 

## 2021-05-09 ENCOUNTER — Telehealth: Payer: Self-pay

## 2021-05-09 ENCOUNTER — Encounter: Payer: Medicaid Other | Admitting: Family

## 2021-05-09 NOTE — Telephone Encounter (Signed)
Call placed to patient regarding home health services.  Unable to leave message as the voicemail is full.

## 2021-05-09 NOTE — Telephone Encounter (Signed)
Transition Care Management Unsuccessful Follow-up Telephone Call  Date of discharge and from where:  05/06/2021 from [facility]  Attempts:  1st Attempt  Reason for unsuccessful TCM follow-up call:  Unable to leave message

## 2021-05-09 NOTE — Telephone Encounter (Signed)
Sent to my voicemail and states that he has been to the hospital due to a vac malfunction and that it is still not working. I advised that we can see him in the office tomorrow that I will call and work out transportation for him and call him in the morning.

## 2021-05-10 NOTE — Telephone Encounter (Signed)
Call received from Lexi/[employer] care management.  She explained that she oversees patient's PCS.  He currently receives 14.5 hours/week of services and she is planning to see him this week and will assess him for an increase in hours.  She will also assess DME needs and possible need for a new wheelchair that he can use more easily in his home.  She said that he will be able to contact his insurance company for non emergent transportation to his medical appointments. She is also consulting with a coworker about possible assistance with utility bills.  Informed her that this CM will attempt to find a home health agency to service the patient but there is no guarantee and agency will accept the referral and she said she understood.  Home health referral faxed to [facility] for review.

## 2021-05-10 NOTE — Telephone Encounter (Signed)
Call returned to Alexis/[employer] # [id number], message left with call back requested to this CM

## 2021-05-10 NOTE — Telephone Encounter (Signed)
;  Advised by transportation that they can move appt to tomorrow at 9:30 called pt to advise they will call to notify of pick up time.

## 2021-05-10 NOTE — Telephone Encounter (Signed)
I called [facility] transportation this morning to see if we could arrange transport for the pt. They are going to give me a call back to see if they can work this out for this afternoon. Oif not we will sch appt for pt tomorrow. Will hold message pending return call.

## 2021-05-10 NOTE — Telephone Encounter (Signed)
Call placed to patient. He explained why he left [facility] - he felt that he was not getting the care that he needed and he paid his friend to bring him home. He now has difficulty maneuvering his wheelchair in his home.  The chair will not fit in his bathroom and he is even having difficulty navigating his bedroom. He said that Lexi from [facility] Care Management is coming to his home tomorrow to assess his situation and also possibly assist with getting the proper ramp for his entry.   He has an appointment with Dr Lajoyce Corners tomorrow- 05/11/2021 and the [facility] will be removed. He is not sure if it will be replaced.  He said that Dr Audrie Lia office is arranging transportation for him.   He also explained that he is behind on utilities and he will discuss that with Lexi tomorrow to see if she is able to assist.  This CM explained that Dr Laural Benes has placed a referral for home health RN/PT.  He did not have a preference for home health agencies.This CM explained that we need to find an agency that is in network with his insurance and is able to accept the referral. There is no guarantee that an agency will be available and he said he understood.

## 2021-05-10 NOTE — Telephone Encounter (Signed)
Transportation called and said that they  could arrange for a visit tomorrow. Sch appt at 2 pm have waiver ready for signature and will fax this once the pt arrives for appt. I called the pt to advise and he states that he is unable to come in tomorrow at 2 pm he has a home visit about a ramp for his home and making the bathroom handicap accessible I emailed transportation  to see if they could schedule the pt for pick up tomorrow at 9:30 will hold this message pending advisement

## 2021-05-10 NOTE — Telephone Encounter (Signed)
Transition Care Management Unsuccessful Follow-up Telephone Call  Date of discharge and from where:  05/06/2021 [facility]  Attempts:  2nd Attempt  Reason for unsuccessful TCM follow-up call:  Unable to leave message

## 2021-05-11 ENCOUNTER — Ambulatory Visit: Payer: Self-pay

## 2021-05-11 ENCOUNTER — Other Ambulatory Visit: Payer: Self-pay

## 2021-05-11 ENCOUNTER — Ambulatory Visit: Payer: Medicaid Other | Admitting: Orthopedic Surgery

## 2021-05-11 ENCOUNTER — Ambulatory Visit (INDEPENDENT_AMBULATORY_CARE_PROVIDER_SITE_OTHER): Payer: Medicaid Other | Admitting: Physician Assistant

## 2021-05-11 ENCOUNTER — Encounter: Payer: Self-pay | Admitting: Orthopedic Surgery

## 2021-05-11 DIAGNOSIS — T8781 Dehiscence of amputation stump: Secondary | ICD-10-CM

## 2021-05-11 MED ORDER — OXYCODONE HCL 10 MG PO TABS: 10.0000 mg | ORAL_TABLET | ORAL | 0 refills | Status: DC | PRN

## 2021-05-11 NOTE — Progress Notes (Signed)
Office Visit Note   Patient: Scott Greer           Date of Birth: [birth year]           MRN: [id number] Visit Date: 05/11/2021              Requested by: [name], MD [address] 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  He is at home now he was at a nursing facility but decided he would be better off at home.  He is requesting more pain medication.  He is still wearing his wound VAC.  It is functioning.  Assessment & Plan: Visit Diagnoses: No diagnosis found.  Plan: Begin daily cleansing he understands and knows to place the shrinker against the skin follow-up in 1 week hopefully for removal of staples  Follow-Up Instructions: No follow-ups on file.   Ortho Exam  Patient is alert, oriented, no adenopathy, well-dressed, normal affect, normal respiratory effort. Examination right below-knee amputation stump wound VAC was removed well apposed wound edges minimal bloody drainage no dehiscence no cellulitis swelling overall well controlled  Imaging: No results found.   Labs: Lab Results  Component Value Date   HGBA1C 6.7 (H) 04/15/2021   HGBA1C 9.1 (H) 02/08/2021   HGBA1C 8.6 (H) 02/07/2021   ESRSEDRATE 78 (H) 06/04/2019   CRP 4.5 (H) 06/04/2019   REPTSTATUS 02/11/2021 FINAL 02/06/2021   REPTSTATUS 02/11/2021 FINAL 02/06/2021   CULT  02/06/2021    NO GROWTH 5 DAYS Performed at [facility], [address], [place], [place] [address]    CULT  02/06/2021    NO GROWTH 5 DAYS Performed at [facility], [address], [place], [place] [address]      Lab Results  Component Value Date   ALBUMIN 2.8 (L) 05/05/2021   ALBUMIN 3.8 04/07/2021   ALBUMIN 2.3 (L) 02/07/2021   PREALBUMIN 13.8 (L) 06/04/2019    Lab Results  Component Value Date   MG 1.8 02/07/2021    MG 2.0 01/05/2021   MG 2.1 01/04/2021   Lab Results  Component Value Date   VD25OH 9.2 (L) 12/14/2020    Lab Results  Component Value Date   PREALBUMIN 13.8 (L) 06/04/2019   CBC EXTENDED Latest Ref Rng & Units 05/05/2021 04/28/2021 04/27/2021  WBC 4.0 - 10.5 K/uL 12.8(H) 14.4(H) 16.3(H)  RBC 4.22 - 5.81 MIL/uL 2.67(L) 2.91(L) 2.82(L)  HGB 13.0 - 17.0 g/dL 7.5(L) 8.0(L) 7.8(L)  HCT 39.0 - 52.0 % 23.5(L) 25.3(L) 24.2(L)  PLT 150 - 400 K/uL 619(H) 575(H) 498(H)  NEUTROABS 1.7 - 7.7 K/uL - - -  LYMPHSABS 0.7 - 4.0 K/uL - - -     There is no height or weight on file to calculate BMI.  Orders:  No orders of the defined types were placed in this encounter.  Meds ordered this encounter  Medications   DISCONTD: Oxycodone HCl 10 MG TABS    Sig: Take 1 tablet (10 mg total) by mouth every 4 (four) hours as needed (pain score 7-10).    Dispense:  30 tablet    Refill:  0   Oxycodone HCl 10 MG TABS    Sig: Take 1 tablet (10 mg total) by mouth every 4 (four)  hours as needed (pain score 7-10).    Dispense:  30 tablet    Refill:  0     Procedures: No procedures performed  Clinical Data: No additional findings.  ROS:  All other systems negative, except as noted in the HPI. Review of Systems  Objective: Vital Signs: There were no vitals taken for this visit.  Specialty Comments:  No specialty comments available.  PMFS History: Patient Active Problem List   Diagnosis Date Noted   Acute hematogenous osteomyelitis of right foot (HCC) 04/26/2021   Gangrene of right foot (HCC)    Foot pain, right 04/14/2021   Subacute osteomyelitis, right ankle and foot (HCC)    Status post below-knee amputation (HCC) 02/17/2021   Wound dehiscence    Gangrene of toe of left foot (HCC)    Cutaneous abscess of left foot    Left foot infection 02/07/2021   Leukocytosis 02/07/2021   Thrombocytosis 02/07/2021   Hyponatremia 02/07/2021   AKI (acute kidney injury) (HCC) 02/07/2021    Hyperglycemia due to diabetes mellitus (HCC) 02/07/2021   Hypoalbuminemia due to protein-calorie malnutrition (HCC) 02/07/2021   Osteomyelitis of second toe of left foot (HCC)    New onset of congestive heart failure (HCC) 12/26/2020   CKD (chronic kidney disease) stage 3, GFR 30-59 ml/min (HCC) 12/26/2020   Acute systolic CHF (congestive heart failure) (HCC) 12/26/2020   Anemia, chronic disease 06/22/2020   Amputee, great toe, right (HCC) 06/20/2020   Normocytic anemia 06/20/2020   Erectile dysfunction associated with type 2 diabetes mellitus (HCC) 06/20/2020   Positive for macroalbuminuria 10/24/2019   Amputation of left great toe (HCC) 10/23/2019   Tobacco dependence 10/23/2019   Osteomyelitis of great toe of right foot (HCC) 06/04/2019   Diabetic foot infection (HCC)    Noncompliance    Glaucoma    Hyperlipidemia    Essential hypertension 11/06/2003   Diabetes mellitus (HCC) 11/05/1997   Past Medical History:  Diagnosis Date   CHF (congestive heart failure) (HCC)    Dehiscence of amputation stump (HCC)    left great toe   Diabetes mellitus without complication (HCC) 1999   Type II   Glaucoma 2015   Hyperlipidemia    Hypertension 2005   Left foot infection 02/07/2021   Osteomyelitis (HCC)    Wears glasses     Family History  Problem Relation Age of Onset   Stroke Mother    Diabetes Mother        Toward end of life    Past Surgical History:  Procedure Laterality Date   AMPUTATION Left 06/05/2019   Procedure: LEFT GREAT TOE AMPUTATION;  Surgeon: [name], MD;  Location: [facility] OR;  Service: Orthopedics;  Laterality: Left;   AMPUTATION Left 07/10/2019   Procedure: LEFT FOOT 1ST RAY AMPUTATION;  Surgeon: [name], MD;  Location: [facility] OR;  Service: Orthopedics;  Laterality: Left;   AMPUTATION Right 05/25/2020   Procedure: RIGHT GREAT TOE AMPUTATION;  Surgeon: [name], MD;  Location: [facility] OR;  Service: Orthopedics;  Laterality: Right;   AMPUTATION Left 01/25/2021    Procedure: LEFT 2ND TOE AMPUTATION;  Surgeon: [name], MD;  Location: [facility] OR;  Service: Orthopedics;  Laterality: Left;   AMPUTATION Left 02/08/2021   Procedure: LEFT TRANSMETATARSAL AMPUTATION;  Surgeon: [name], MD;  Location: [facility] OR;  Service: Orthopedics;  Laterality: Left;   AMPUTATION Left 02/10/2021   Procedure: AMPUTATION BELOW KNEE;  Surgeon: [name], MD;  Location: [facility] OR;  Service: Orthopedics;  Laterality: Left;   AMPUTATION Right 04/14/2021   Procedure: RIGHT 1ST AND 2ND RAY AMPUTATION;  Surgeon: [name], MD;  Location: [facility] OR;  Service: Orthopedics;  Laterality: Right;   AMPUTATION Right 04/26/2021   Procedure: RIGHT BELOW KNEE AMPUTATION;  Surgeon: [name], MD;  Location: [facility] OR;  Service: Orthopedics;  Laterality: Right;   NO PAST SURGERIES     RIGHT/LEFT HEART CATH AND CORONARY ANGIOGRAPHY N/A 01/02/2021   Procedure: RIGHT/LEFT HEART CATH AND CORONARY ANGIOGRAPHY;  Surgeon: Runell Gess, MD;  Location: [facility];  Service: Cardiovascular;  Laterality: N/A;   Social History   Occupational History   Not on file  Tobacco Use   Smoking status: Every Day    Pack years: 0.00    Types: Cigars, Cigarettes   Smokeless tobacco: Former    Types: Chew   Tobacco comments:    6 daily  Vaping Use   Vaping Use: Never used  Substance and Sexual Activity   Alcohol use: Yes    Alcohol/week: 11.0 standard drinks    Types: 3 Cans of beer, 8 Shots of liquor per week    Comment: occasional   Drug use: Yes    Types: Marijuana    Comment: ocassional - last time 05/08/2020   Sexual activity: Not Currently

## 2021-05-11 NOTE — Telephone Encounter (Signed)
Transition Care Management Follow-up Telephone Call Date of discharge and from where: 05/06/2021-[facility] ED How have you been since you were released from the hospital? Patient is sore and unable to get around. Any questions or concerns? Yes. Patient stated he has questions that he will relay to [facility] representative coming out today.  Items Reviewed: Did the pt receive and understand the discharge instructions provided? Yes  Medications obtained and verified? Yes  Other? No  Any new allergies since your discharge? No  Dietary orders reviewed? Yes Do you have support at home? Yes . Ex-Wife comes over and help daily.   Home Care and Equipment/Supplies: Were home health services ordered? yes If so, what is the name of the agency? [facility] Has the agency set up a time to come to the patient's home? yes Were any new equipment or medical supplies ordered?  No. Having evaluation done today to see what supplies are needed. What is the name of the medical supply agency? Unknown Were you able to get the supplies/equipment? Has not been ordered yet. Do you have any questions related to the use of the equipment or supplies? No. Don't have supplies yet.   Functional Questionnaire: (I = Independent and D = Dependent) ADLs: D  Bathing/Dressing- D  Meal Prep- D  Eating- I  Maintaining continence- I  Transferring/Ambulation- D  Managing Meds- I  Follow up appointments reviewed:  PCP Hospital f/u appt confirmed? No . Patient has not scheduled with PCP yet. Patient will contact transportation.  Specialist Hospital f/u appt confirmed? Yes  Scheduled to see Orthopedics on 05/18/2021@ 1 pm. Are transportation arrangements needed? Yes . Patient stated he has been signed up for transportation assistance.  If their condition worsens, is the pt aware to call PCP or go to the Emergency Dept.? Yes Was the patient provided with contact information for the PCP's office or ED? Yes Was to pt  encouraged to call back with questions or concerns? Yes

## 2021-05-15 ENCOUNTER — Other Ambulatory Visit: Payer: Self-pay | Admitting: *Deleted

## 2021-05-15 NOTE — Patient Instructions (Signed)
Visit Information  [name]  - as a part of your Medicaid benefit, you are eligible for care management and care coordination services at no cost or copay. I was unable to reach you by phone today but would be happy to help you with your health related needs. Please feel free to call me @ [phone].   A member of the Managed Medicaid care management team will reach out to you again over the next 7-14 days.   Estanislado Emms RN, BSN [place]  [employer]

## 2021-05-15 NOTE — Telephone Encounter (Signed)
Call placed to [facility]# [phone], spoke to [place] who said that they are not able to accept the referral.  Call placed to [facility] # [phone], spoke to [place] who stated that they are not able to accept the referral due to staffing.

## 2021-05-15 NOTE — Patient Outreach (Signed)
Care Coordination  05/15/2021  CORNELIUS MARULLO December 30, 1955 [id number]    Medicaid Managed Care   Unsuccessful Outreach Note  05/15/2021 Name: TRA WILEMON MRN: [id number] DOB: 1956/04/15  Referred by: [name], MD Reason for referral : High Risk Managed Medicaid (RNCM follow up outreach)   An unsuccessful telephone outreach was attempted today. The patient was referred to the case management team for assistance with care management and care coordination.   Follow Up Plan: A HIPAA compliant phone message was left for the patient providing contact information and requesting a return call.  The care management team will reach out to the patient again over the next 14 days.    Medicaid Managed Care   Unsuccessful Outreach Note  05/15/2021 Name: DONTAVIAN MARCHI MRN: [id number] DOB: [birth year]  Referred by: [name], MD Reason for referral : High Risk Managed Medicaid (RNCM follow up outreach)   An unsuccessful telephone outreach was attempted today. The patient was referred to the case management team for assistance with care management and care coordination.   Follow Up Plan: A HIPAA compliant phone message was left for the patient providing contact information and requesting a return call.  The care management team will reach out to the patient again over the next 14 days.   Estanislado Emms RN, BSN [place]  [employer]

## 2021-05-16 ENCOUNTER — Telehealth: Payer: Self-pay

## 2021-05-16 DIAGNOSIS — S88112A Complete traumatic amputation at level between knee and ankle, left lower leg, initial encounter: Secondary | ICD-10-CM

## 2021-05-16 DIAGNOSIS — Z89511 Acquired absence of right leg below knee: Secondary | ICD-10-CM

## 2021-05-16 NOTE — Telephone Encounter (Signed)
Call placed to [facility] regarding home health. Spoke to [place] who requested the referral be faxed for review.   Referral then faxed to # [phone].

## 2021-05-17 DIAGNOSIS — L089 Local infection of the skin and subcutaneous tissue, unspecified: Secondary | ICD-10-CM | POA: Diagnosis not present

## 2021-05-17 DIAGNOSIS — L02612 Cutaneous abscess of left foot: Secondary | ICD-10-CM | POA: Diagnosis not present

## 2021-05-17 DIAGNOSIS — T8130XA Disruption of wound, unspecified, initial encounter: Secondary | ICD-10-CM | POA: Diagnosis not present

## 2021-05-17 DIAGNOSIS — I96 Gangrene, not elsewhere classified: Secondary | ICD-10-CM | POA: Diagnosis not present

## 2021-05-17 NOTE — Telephone Encounter (Signed)
Per [facility], they are not able to accept the referral.   Call placed to [facility], spoke to [facility] who said that they are not able to accept the referral because they are not in network with insurance   Call placed to [facility] # [phone], spoke to [place] who stated that that she is not able to accept the referral at this time, the start of care date could not be confirmed and it might be 2 weeks before they could see him.  Call placed to [facility] # [phone], spoke to [facility] who requested referral be faxed for review.  Referral then faxed to # [phone].  Call received from [place]/[employer]. She explained that she made a home visit last week. Patient's wheelchair is too big  to access all of the rooms in his home.  She is requesting an order for a narrow wheelchair.  His doorways are 22 inches wide.  She is also requesting a bedside commode, tub transfer bench. She confirmed that he VAC has been removed.   He has not heard any information about [facility]CS and she will contact the [facility] provider to check on that referral.  This CM explained that attempt is still being made to secure a home health agency for SN and PT. However,the referral has been rejected by 5 agencies so far.  This CM will continue to contact additional agencies.

## 2021-05-18 ENCOUNTER — Encounter: Payer: Self-pay | Admitting: Physician Assistant

## 2021-05-18 ENCOUNTER — Telehealth: Payer: Self-pay | Admitting: *Deleted

## 2021-05-18 ENCOUNTER — Ambulatory Visit (INDEPENDENT_AMBULATORY_CARE_PROVIDER_SITE_OTHER): Payer: Medicaid Other | Admitting: Physician Assistant

## 2021-05-18 DIAGNOSIS — T8781 Dehiscence of amputation stump: Secondary | ICD-10-CM

## 2021-05-18 NOTE — Telephone Encounter (Signed)
Phone call placed to patient this evening.  Patient informed that we have tried 10 different home health agencies and none are willing to accept the referral for him to have a nurse coming into the home to help with dressing changes and for home physical therapy.  Patient tells me he saw the orthopedics today and his wound is healing okay.  I asked whether he would be willing to do outpatient physical therapy and he is willing to do so as long as transportation can be arranged.  Message sent to case worker.  Referral submitted.

## 2021-05-18 NOTE — Addendum Note (Signed)
Addended by: Jonah Blue B on: 05/18/2021 09:40 PM   Modules accepted: Orders

## 2021-05-18 NOTE — Telephone Encounter (Signed)
Call received from [facility] who said that they are not accepting medicaid referrals.   I have contacted 10 agencies and none have accepted this referral for RN/PT

## 2021-05-18 NOTE — Progress Notes (Signed)
Office Visit Note   Patient: Scott Greer           Date of Birth: [birth year]           MRN: [id number] Visit Date: 05/18/2021              Requested by: [name], MD [address] [place],  [place] [address] PCP: [name], MD  Chief Complaint  Patient presents with   Right Knee - Routine Post Op    Right BKA      HPI: Patient is a pleasant 65 year old who is 3 weeks status post right below-knee amputation.  He has been wearing his shrinker as directed.  Assessment & Plan: Visit Diagnoses: No diagnosis found.  Plan: Prescription was provided for [employer] for a prosthetic and supplies.  He is already working with them on his left below-knee amputation.  Follow-up in 3 to 4 weeks  Follow-Up Instructions: No follow-ups on file.   Ortho Exam  Patient is alert, oriented, no adenopathy, well-dressed, normal affect, normal respiratory effort. Right below-knee amputation stump well apposed wound edges with good healing some slight blistering at the terminal end of the stump but no ascending cellulitis swelling is overall well controlled no necrosis surgical staples harvested today  Imaging: No results found. No images are attached to the encounter.  Labs: Lab Results  Component Value Date   HGBA1C 6.7 (H) 04/15/2021   HGBA1C 9.1 (H) 02/08/2021   HGBA1C 8.6 (H) 02/07/2021   ESRSEDRATE 78 (H) 06/04/2019   CRP 4.5 (H) 06/04/2019   REPTSTATUS 02/11/2021 FINAL 02/06/2021   REPTSTATUS 02/11/2021 FINAL 02/06/2021   CULT  02/06/2021    NO GROWTH 5 DAYS Performed at [facility] Lab, [address] [place], [place] [address]    CULT  02/06/2021    NO GROWTH 5 DAYS Performed at [facility], [address], [place], [place] [address]      Lab Results  Component Value Date   ALBUMIN 2.8 (L) 05/05/2021   ALBUMIN 3.8 04/07/2021   ALBUMIN 2.3 (L) 02/07/2021   PREALBUMIN 13.8 (L) 06/04/2019    Lab Results  Component Value Date   MG 1.8  02/07/2021   MG 2.0 01/05/2021   MG 2.1 01/04/2021   Lab Results  Component Value Date   VD25OH 9.2 (L) 12/14/2020    Lab Results  Component Value Date   PREALBUMIN 13.8 (L) 06/04/2019   CBC EXTENDED Latest Ref Rng & Units 05/05/2021 04/28/2021 04/27/2021  WBC 4.0 - 10.5 K/uL 12.8(H) 14.4(H) 16.3(H)  RBC 4.22 - 5.81 MIL/uL 2.67(L) 2.91(L) 2.82(L)  HGB 13.0 - 17.0 g/dL 7.5(L) 8.0(L) 7.8(L)  HCT 39.0 - 52.0 % 23.5(L) 25.3(L) 24.2(L)  PLT 150 - 400 K/uL 619(H) 575(H) 498(H)  NEUTROABS 1.7 - 7.7 K/uL - - -  LYMPHSABS 0.7 - 4.0 K/uL - - -     There is no height or weight on file to calculate BMI.  Orders:  No orders of the defined types were placed in this encounter.  No orders of the defined types were placed in this encounter.    Procedures: No procedures performed  Clinical Data: No additional findings.  ROS:  All other systems negative, except as noted in the HPI. Review of Systems  Objective: Vital Signs: There were no vitals taken for this visit.  Specialty Comments:  No specialty comments available.  PMFS History: Patient Active Problem List   Diagnosis Date Noted  Acute hematogenous osteomyelitis of right foot (HCC) 04/26/2021   Gangrene of right foot (HCC)    Foot pain, right 04/14/2021   Subacute osteomyelitis, right ankle and foot (HCC)    Status post below-knee amputation (HCC) 02/17/2021   Wound dehiscence    Gangrene of toe of left foot (HCC)    Cutaneous abscess of left foot    Left foot infection 02/07/2021   Leukocytosis 02/07/2021   Thrombocytosis 02/07/2021   Hyponatremia 02/07/2021   AKI (acute kidney injury) (HCC) 02/07/2021   Hyperglycemia due to diabetes mellitus (HCC) 02/07/2021   Hypoalbuminemia due to protein-calorie malnutrition (HCC) 02/07/2021   Osteomyelitis of second toe of left foot (HCC)    New onset of congestive heart failure (HCC) 12/26/2020   CKD (chronic kidney disease) stage 3, GFR 30-59 ml/min (HCC) 12/26/2020    Acute systolic CHF (congestive heart failure) (HCC) 12/26/2020   Anemia, chronic disease 06/22/2020   Amputee, great toe, right (HCC) 06/20/2020   Normocytic anemia 06/20/2020   Erectile dysfunction associated with type 2 diabetes mellitus (HCC) 06/20/2020   Positive for macroalbuminuria 10/24/2019   Amputation of left great toe (HCC) 10/23/2019   Tobacco dependence 10/23/2019   Osteomyelitis of great toe of right foot (HCC) 06/04/2019   Diabetic foot infection (HCC)    Noncompliance    Glaucoma    Hyperlipidemia    Essential hypertension 11/06/2003   Diabetes mellitus (HCC) 11/05/1997   Past Medical History:  Diagnosis Date   CHF (congestive heart failure) (HCC)    Dehiscence of amputation stump (HCC)    left great toe   Diabetes mellitus without complication (HCC) 1999   Type II   Glaucoma 2015   Hyperlipidemia    Hypertension 2005   Left foot infection 02/07/2021   Osteomyelitis (HCC)    Wears glasses     Family History  Problem Relation Age of Onset   Stroke Mother    Diabetes Mother        Toward end of life    Past Surgical History:  Procedure Laterality Date   AMPUTATION Left 06/05/2019   Procedure: LEFT GREAT TOE AMPUTATION;  Surgeon: [name], MD;  Location: [facility] OR;  Service: Orthopedics;  Laterality: Left;   AMPUTATION Left 07/10/2019   Procedure: LEFT FOOT 1ST RAY AMPUTATION;  Surgeon: [name], MD;  Location: [facility] OR;  Service: Orthopedics;  Laterality: Left;   AMPUTATION Right 05/25/2020   Procedure: RIGHT GREAT TOE AMPUTATION;  Surgeon: [name], MD;  Location: [facility] OR;  Service: Orthopedics;  Laterality: Right;   AMPUTATION Left 01/25/2021   Procedure: LEFT 2ND TOE AMPUTATION;  Surgeon: [name], MD;  Location: [facility] OR;  Service: Orthopedics;  Laterality: Left;   AMPUTATION Left 02/08/2021   Procedure: LEFT TRANSMETATARSAL AMPUTATION;  Surgeon: [name], MD;  Location: [facility] OR;  Service: Orthopedics;  Laterality: Left;   AMPUTATION Left  02/10/2021   Procedure: AMPUTATION BELOW KNEE;  Surgeon: [name], MD;  Location: [facility] OR;  Service: Orthopedics;  Laterality: Left;   AMPUTATION Right 04/14/2021   Procedure: RIGHT 1ST AND 2ND RAY AMPUTATION;  Surgeon: [name], MD;  Location: [facility] OR;  Service: Orthopedics;  Laterality: Right;   AMPUTATION Right 04/26/2021   Procedure: RIGHT BELOW KNEE AMPUTATION;  Surgeon: [name], MD;  Location: [facility] OR;  Service: Orthopedics;  Laterality: Right;   NO PAST SURGERIES     RIGHT/LEFT HEART CATH AND CORONARY ANGIOGRAPHY N/A 01/02/2021   Procedure:  RIGHT/LEFT HEART CATH AND CORONARY ANGIOGRAPHY;  Surgeon: Runell Gess, MD;  Location: [facility] INVASIVE CV LAB;  Service: Cardiovascular;  Laterality: N/A;   Social History   Occupational History   Not on file  Tobacco Use   Smoking status: Every Day    Types: Cigars, Cigarettes   Smokeless tobacco: Former    Types: Chew   Tobacco comments:    6 daily  Vaping Use   Vaping Use: Never used  Substance and Sexual Activity   Alcohol use: Yes    Alcohol/week: 11.0 standard drinks    Types: 3 Cans of beer, 8 Shots of liquor per week    Comment: occasional   Drug use: Yes    Types: Marijuana    Comment: ocassional - last time 05/08/2020   Sexual activity: Not Currently

## 2021-05-18 NOTE — Telephone Encounter (Signed)
Call placed to [facility], spoke ot [place] who said they are not able to accept the referral.  Call placed to [facility] # [phone], spoke to Kathy/Central Intake who said that they do not have resources to accept the referral.  Call to [facility] # [phone], spoke to [place] who declined the referral.  Call to [facility] # [phone], spoke to [place] who said they don't accept medicaid or managed medicaid, only medicare.   Calls made to [facility] # [phone] and [phone], messages left at both numbers on option # 3. Call back requested to this CM.    Orders for wheelchair , tub transfer bench and bedside commode faxed to [facility]. Call then placed to patient to inform him of DME orders. Message left with call back requested to this CM.

## 2021-05-18 NOTE — Telephone Encounter (Signed)
Telephone call from patient this morning asking about transportation to/from his appointment today with our office at 1:00 pm. RNCM call to transportation and they were able to provide today. Reviewed pick up time with patient at 12:00 pm and is was agreeable.

## 2021-05-19 ENCOUNTER — Telehealth: Payer: Self-pay | Admitting: Internal Medicine

## 2021-05-19 ENCOUNTER — Encounter: Payer: Self-pay | Admitting: Internal Medicine

## 2021-05-19 NOTE — Telephone Encounter (Signed)
Scott Greer from [facility] called ina bout getting the pt a Bedside commode and tub transfer bench, on behalf of previous office visit on 07/07. Please advise.

## 2021-05-19 NOTE — Telephone Encounter (Signed)
Rx has been faxed to [facility]  

## 2021-05-19 NOTE — Telephone Encounter (Signed)
ERROR

## 2021-05-22 ENCOUNTER — Telehealth: Payer: Self-pay | Admitting: Internal Medicine

## 2021-05-22 ENCOUNTER — Other Ambulatory Visit: Payer: Self-pay

## 2021-05-22 ENCOUNTER — Other Ambulatory Visit: Payer: Self-pay | Admitting: *Deleted

## 2021-05-22 NOTE — Patient Outreach (Addendum)
Medicaid Managed Care   Nurse Care Manager Note  05/22/2021 Name:  Scott Greer MRN:  [id number] DOB:  1956/09/02  Scott Greer is an 65 y.o. year old male who is a primary patient of Scott Pier, MD.  The [facility] Managed Care Coordination team was consulted for assistance with:    Recent BKA and DMII  Scott Greer was given information about Medicaid Managed Care Coordination team services today. [name] agreed to services and verbal consent obtained.  Engaged with patient by telephone for follow up visit in response to provider referral for case management and/or care coordination services.   Assessments/Interventions:  Review of past medical history, allergies, medications, health status, including review of consultants reports, laboratory and other test data, was performed as part of comprehensive evaluation and provision of chronic care management services.  SDOH (Social Determinants of Health) assessments and interventions performed:   Care Plan  Allergies  Allergen Reactions   Bee Venom Hives, Itching and Swelling    Medications Reviewed Today     Reviewed by Persons, [name], PA (Physician Assistant) on 05/18/21 at 1258  Med List Status: <None>   Medication Order Taking? Sig Documenting Provider Last Dose Status Informant  amiodarone (PACERONE) 200 MG tablet [id number] No Take 1 tablet (200 mg total) by mouth daily.  Patient taking differently: Take 200 mg by mouth daily with lunch.   [name], MD Past Week Active Self  aspirin 81 MG chewable tablet [id number] No Chew 1 tablet (81 mg total) by mouth daily.  Patient taking differently: Chew 81 mg by mouth daily with breakfast.   [name], MD Unknown Active   atorvastatin (LIPITOR) 10 MG tablet [id number] No Take 1 tablet (10 mg total) by mouth daily.  Patient taking differently: Take 10 mg by mouth daily with supper.   [name], MD Unknown Active Self  Blood Glucose Monitoring  Suppl (TRUE METRIX METER) w/Device Drucie Opitz [id number] No Use as directed Scott Pier, MD Unknown Active Other  carvedilol (COREG) 3.125 MG tablet [id number] No Take 1 tablet (3.125 mg total) by mouth 2 (two) times daily with a meal. [name], MD Unknown Active Self  digoxin (LANOXIN) 0.125 MG tablet [id number] No Take 1 tablet (0.125 mg total) by mouth daily.  Patient taking differently: Take 0.125 mg by mouth daily with supper.   [name], MD Unknown Active Self  empagliflozin (JARDIANCE) 10 MG TABS tablet [id number] No Take 1 tablet (10 mg total) by mouth daily.  Patient taking differently: Take 10 mg by mouth daily with lunch.   [name], MD Unknown Active Self  ferrous sulfate 325 (65 FE) MG tablet [id number] No Take 1 tablet (325 mg total) by mouth daily with breakfast. Scott Pier, MD Unknown Active Self  gabapentin (NEURONTIN) 300 MG capsule [id number] No TAKE ONE CAPSULE BY MOUTH THREE TIMES DAILY  Patient taking differently: Take 300 mg by mouth 3 (three) times daily with meals.   Scott Pier, MD Unknown Active Self  glucose blood (TRUE METRIX BLOOD GLUCOSE TEST) test strip [id number] No Use as instructed Scott Pier, MD Unknown Active Other  isosorbide-hydrALAZINE (BIDIL) 20-37.5 MG tablet [id number] No Take 1 tablet by mouth 3 (three) times daily with meals. [provider] Unknown Active Self  metFORMIN (GLUCOPHAGE) 500 MG tablet [id number] No TAKE ONE TABLET BY MOUTH TWICE DAILY WITH A MEAL Scott Pier, MD 04/26/2021 0900 Active   nutrition supplement,  [name]) PACK [id number] No Take 1 packet by mouth 2 (two) times daily between meals. Persons, [name], [name] Unknown Active   Oxycodone HCl 10 MG TABS [id number]  Take 1 tablet (10 mg total) by mouth every 4 (four) hours as needed (pain score 7-10). Persons, [name], [name]  Active   potassium chloride SA (KLOR-CON) 20 MEQ tablet [id number] No TAKE 1 TABLET (20 MEQ) BY MOUTH DAILY.   Patient taking differently: Take 20 mEq by mouth daily with breakfast.   [name], MD Unknown Active Self  sacubitril-valsartan (ENTRESTO) 24-26 MG [id number] No Take 1 tablet by mouth 2 (two) times daily.  Patient taking differently: Take 1 tablet by mouth 2 (two) times daily with a meal.   [name], MD Unknown Active Self  spironolactone (ALDACTONE) 25 MG tablet [id number] No TAKE 1 TABLET (25 MG TOTAL) BY MOUTH DAILY.  Patient taking differently: Take 25 mg by mouth daily with breakfast.   [name], MD Unknown Active Self  torsemide (DEMADEX) 20 MG tablet [id number] No Take 1 tablet (20 mg total) by mouth daily. [name], MD Unknown Active Self           Med Note Payton Doughty   VQQ Apr 15, 2021  3:32 PM) Pt states he has a bottle for this - not in packaged meds from [facility]  TRUEplus Lancets [place] [id number] No Use as directed Scott Pier, MD Unknown Active Other  Vitamin D, Ergocalciferol, (DRISDOL) 1.25 MG (50000 UNIT) CAPS capsule [id number] No Take 1 capsule (50,000 Units total) by mouth every 7 (seven) days.  Patient taking differently: Take 50,000 Units by mouth every Sunday.   Scott Pier, MD Unknown Active   zinc sulfate 220 (50 Zn) MG capsule [id number] No Take 1 capsule (220 mg total) by mouth daily. Persons, [name], [name] Unknown Active             Patient Active Problem List   Diagnosis Date Noted   Acute hematogenous osteomyelitis of right foot ([place]) 04/26/2021   Gangrene of right foot ([place])    Foot pain, right 04/14/2021   Subacute osteomyelitis, right ankle and foot ([place])    Status post below-knee amputation ([place]) 02/17/2021   Wound dehiscence    Gangrene of toe of left foot ([place])    Cutaneous abscess of left foot    Left foot infection 02/07/2021   Leukocytosis 02/07/2021   Thrombocytosis 02/07/2021   Hyponatremia 02/07/2021   AKI (acute kidney injury) ([place]) 02/07/2021   Hyperglycemia due to diabetes mellitus  ([place]) 02/07/2021   Hypoalbuminemia due to protein-calorie malnutrition ([place]) 02/07/2021   Osteomyelitis of second toe of left foot ([place])    New onset of congestive heart failure ([place]) 12/26/2020   CKD (chronic kidney disease) stage 3, GFR 30-59 ml/min (HCC) 60/08/9322   Acute systolic CHF (congestive heart failure) ([place]) 12/26/2020   Anemia, chronic disease 06/22/2020   Amputee, great toe, right ([place]) 06/20/2020   Normocytic anemia 06/20/2020   Erectile dysfunction associated with type 2 diabetes mellitus ([place]) 06/20/2020   Positive for macroalbuminuria 10/24/2019   Amputation of left great toe ([place]) 10/23/2019   Tobacco dependence 10/23/2019   Osteomyelitis of great toe of right foot ([place]) 06/04/2019   Diabetic foot infection ([place])    Noncompliance    Glaucoma    Hyperlipidemia    Essential hypertension 11/06/2003   Diabetes mellitus ([place]) 11/05/1997    Conditions to be addressed/monitored per PCP  order:  DMII and BKA  Care Plan : Diabetes Type 2 (Adult)  Updates made by [name], RN since 05/22/2021 12:00 AM     Problem: Disease Progression (Diabetes, Type 2)      Goal: Disease Progression Prevented or Minimized   Start Date: 05/22/2021  Expected End Date: 08/21/2021  This Visit's Progress: On track  Priority: High  Note:   Objective:  Lab Results  Component Value Date   HGBA1C 6.7 (H) 04/15/2021   Lab Results  Component Value Date   CREATININE 1.31 (H) 05/05/2021   CREATININE 1.21 04/28/2021   CREATININE 1.36 (H) 04/27/2021   Lab Results  Component Value Date   EGFR 86 04/07/2021   Current Barriers:  Knowledge Deficits related to basic Diabetes pathophysiology and self care/management-Mr. Oatis had recent right BKA, 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 He reports falling this morning during transferring chairs. He is waiting to hear from Dr. Wynetta Emery, who is scheduling PT and arranging transportation. This RNCM will follow up next week and assist with scheduling if  not scheduled. He reports having all medications delivered and taking as directed. He met with someone from [place] and will start receiving a warm meal within the next two weeks. [employer] fast streamer Social Support Does not adhere to provider recommendations re:  Unable to perform ADLs independently Unable to perform IADLs independently Does not contact provider office for questions/concerns Case Manager Clinical Goal(s):  patient will demonstrate improved adherence to prescribed treatment plan for diabetes self care/management as evidenced by: daily monitoring and recording of CBG  adherence to ADA/ carb modified diet adherence to prescribed medication regimen contacting provider for new or worsened symptoms or questions Interventions:  Inter-disciplinary care team collaboration (see longitudinal plan of care) Provided education to patient about basic DM disease process Discussed plans with patient for ongoing care management follow up and provided patient with direct contact information for care management team Reviewed scheduled/upcoming provider appointments including: working with PCP for scheduling PT Advised patient, providing education and rationale, to check cbg as directed and record, calling PCP for findings outside established parameters.   Referral made to social work team for assistance with managing emotions and financial resources Review of patient status, including review of consultants reports, relevant laboratory and other test results, and medications completed. Advised patient to call [facility] [phone] for health rewards offered monthly Discussed scheduling PT-Patient reports that Dr. Wynetta Emery is working on scheduling PT and arranging transportation. Self-Care Activities - Self administers oral medications as prescribed - Self administers insulin as prescribed - Attends all scheduled provider appointments - Checks blood sugars as prescribed and utilize hyper and  hypoglycemia protocol as needed - Adheres to prescribed ADA/carb modified Patient Goals: - check blood sugar at prescribed times - check blood sugar if I feel it is too high or too low - take the blood sugar log to all doctor visits - take the blood sugar meter to all doctor visits - follow up with PCP re: PT scheduling and attend all scheduled appointments  Follow Up Plan: Telephone follow up appointment with care management team member scheduled for:05/29/21 @ 1:30pm      Follow Up:  Patient agrees to Care Plan and Follow-up.  Plan: The Managed Medicaid care management team will reach out to the patient again over the next 7 days.  Date/time of next scheduled RN care management/care coordination outreach:  05/29/21 @ 1:30pm  [name] RN, [facility] RN Care  Coordinator

## 2021-05-22 NOTE — Patient Instructions (Addendum)
Visit Information  Mr. Ericksen was given information about Medicaid Managed Care team care coordination services as a part of their [facility] Medicaid benefit. [name] verbally consented to engagement with the [facility] Managed Care team.   For questions related to your [facility] health plan, please call: [phone] or go here:[email or web address]  If you would like to schedule transportation through your [facility] plan, please call the following number at least 2 days in advance of your appointment: [phone].  Call the [place] at [phone], at any time, 24 hours a day, 7 days a week. If you are in danger or need immediate medical attention call 911.  If you would like help to quit smoking, call 1-800-QUIT-NOW 331-425-3122) OR Espaol: 1-855-Djelo-Ya (8-032-122-4825) o para ms informacin haga clic aqu or Text READY to 200-400 to register via text  Mr. Huskins - following are the goals we discussed in your visit today:   Goals Addressed             This Visit's Progress    Monitor and Manage My Blood Sugar-Diabetes Type 2       Timeframe:  Long-Range Goal Priority:  High Start Date:  05/22/21                           Expected End Date:  08/21/21                     Follow Up Date 05/29/2021    - check blood sugar at prescribed times - check blood sugar if I feel it is too high or too low - take the blood sugar log to all doctor visits - take the blood sugar meter to all doctor visits - follow up with PCP re: PT scheduling and attend all scheduled appointments    Why is this important?   Checking your blood sugar at home helps to keep it from getting very high or very low.  Writing the results in a diary or log helps the doctor know how to care for you.  Your blood sugar log should have the time, date and the results.  Also, write down the amount of insulin or other medicine that you take.  Other information, like  what you ate, exercise done and how you were feeling, will also be helpful.             Please see education materials related to diabetes and amputation provided as print materials.   Patient verbalizes understanding of instructions provided today.   Telephone follow up appointment with Managed Medicaid care management team member scheduled for:05/29/21 @ 1:30pm  [name] RN, BSN [place] RN Care Coordinator   Following is a copy of your plan of care:  Patient Care Plan: General Plan of Care (Adult)  Completed 05/02/2021   Problem Identified: Health Promotion or Disease Self-Management (General Plan of Care) Resolved 05/02/2021  Note:   05/02/21-Patient discharged to SNF placement    Long-Range Goal: Self-Management Plan Developed Completed 05/02/2021  Start Date: 02/17/2021  Expected End Date: 05/19/2021  Recent Progress: On track  Priority: High  Note:   Current Barriers:  Ineffective Self Health Maintenance-Mr. Coldwell 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 He has a rolling walker and manual wheelchair, he will begin having meal delivery today for 10 days, he was approved for 14 hours/wk for PCS, [name], EMT is managing/preparing  his medications, he is waiting on PT to be scheduled. He is concerned about his finanaces. He will no longer be able to work and is unsure how he can manage all of his bills.-Update-Mr. Rauls has not started PT at home. He is concerned about possible eviction and how to manage his bills. Mr. Dowland is receiving PCS at this time. He is working with [name] for medication management and will have his medicines delivered and packaged to assist with compliance. Unable to independently manage health care related to recent BKA Does not attend all scheduled provider appointments Unable to perform ADLs independently Unable to perform IADLs independently Does not contact provider office for  questions/concerns Financial insecurity Currently UNABLE TO independently self manage needs related to chronic health conditions.  Knowledge Deficits related to short term plan for care coordination needs and long term plans for chronic disease management needs Patient discharged to SNF placement Nurse Case Manager Clinical Goal(s):  patient will work with care management team to address care coordination and chronic disease management needs related to recent BKA   Interventions:  Evaluation of current treatment plan related to BKA and patient's adherence to plan as established by provider. Discussed plans with patient for ongoing care management follow up and provided patient with direct contact information for care management team Provided patient with printed educational materials related to diabetic diet Discussed the importance of compliance with diabetic diet and keeping BS at normal levels for good healing Provided therapeutic listening Collaborated with [facility] regarding home PT services-RNCM is waiting for a return call from representative-will reach out to [facility] to follow up Encouraged patient to call today and follow up on resources provided by LCSW and BSW Reviewed upcoming scheduled appointments including 5/17 for wound check and 03/30/21 with PCP and discussed the importance of keeping all appointments Provided the number for [facility] for free phone benefit [phone], contact number for free medical transportation [phone] Self Care Activities:  Patient will self administer medications as prescribed Patient will attend all scheduled provider appointments Patient will call provider office for new concerns or questions Patient will work with BSW to address care coordination needs and will continue to work with the clinical team to address health care and disease management related needs.   Patient will work with [facility] Pharmacist, [name] for medication management Patient  Goals: -call for [facility] for free phone benefit [phone] - begin a notebook of services in my neighborhood or community - call 211 when I need some help - follow-up on any referrals for help I am given - make a list of family or friends that I can call  - arrange a ride through an agency 1 week before appointment, call [phone] - ask family or friend for a ride - keep a calendar with appointment dates  Follow Up Plan: Telephone follow up appointment with care management team member scheduled for:04/20/21 @ 10:30am     Patient Care Plan: Medication Management     Problem Identified: Health Promotion or Disease Self-Management (General Plan of Care)      Goal: Medication Management Completed 05/03/2021  Note:   Current Barriers:  Unable to independently monitor therapeutic efficacy Unable to self administer medications as prescribed Does not adhere to prescribed medication regimen Does not maintain contact with provider office Does not contact provider office for questions/concerns   Pharmacist Clinical Goal(s):  Over the next 30 days, patient will contact provider office for questions/concerns as evidenced notation of same in electronic health record through collaboration  with PharmD and provider.    Interventions: Inter-disciplinary care team collaboration (see longitudinal plan of care) Comprehensive medication review performed; medication list updated in electronic medical record  @RXCPDIABETES @ @RXCPHYPERTENSION @ @RXCPHYPERLIPIDEMIA @  Patient Goals/Self-Care Activities Over the next 30 days, patient will:  - take medications as prescribed  Follow Up Plan: The patient has been provided with contact information for the care management team and has been advised to call with any health related questions or concerns.       Patient Care Plan: Diabetes Type 2 (Adult)     Problem Identified: Disease Progression (Diabetes, Type 2)      Goal: Disease  Progression Prevented or Minimized   Start Date: 05/22/2021  Expected End Date: 08/21/2021  This Visit's Progress: On track  Priority: High  Note:   Objective:  Lab Results  Component Value Date   HGBA1C 6.7 (H) 04/15/2021   Lab Results  Component Value Date   CREATININE 1.31 (H) 05/05/2021   CREATININE 1.21 04/28/2021   CREATININE 1.36 (H) 04/27/2021   Lab Results  Component Value Date   EGFR 86 04/07/2021   Current Barriers:  Knowledge Deficits related to basic Diabetes pathophysiology and self care/management-Mr. Peppard had recent right BKA, 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 He reports falling this morning during transferring chairs. He is waiting to hear from Dr. Wynetta Emery, who is scheduling PT and arranging transportation. This RNCM will follow up next week and assist with scheduling if not scheduled. He reports having all medications delivered and taking as directed. [employer] fast streamer Social Support Does not adhere to provider recommendations re:  Unable to perform ADLs independently Unable to perform IADLs independently Does not contact provider office for questions/concerns Case Manager Clinical Goal(s):  patient will demonstrate improved adherence to prescribed treatment plan for diabetes self care/management as evidenced by: daily monitoring and recording of CBG  adherence to ADA/ carb modified diet adherence to prescribed medication regimen contacting provider for new or worsened symptoms or questions Interventions:  Inter-disciplinary care team collaboration (see longitudinal plan of care) Provided education to patient about basic DM disease process Discussed plans with patient for ongoing care management follow up and provided patient with direct contact information for care management team Reviewed scheduled/upcoming provider appointments including: working with PCP for scheduling PT Advised patient, providing education and rationale, to check cbg as directed and  record, calling PCP for findings outside established parameters.   Referral made to social work team for assistance with managing emotions and financial resources Review of patient status, including review of consultants reports, relevant laboratory and other test results, and medications completed. Advised patient to call [facility] [phone] for health rewards offered monthly Discussed scheduling PT-Patient reports that Dr. Wynetta Emery is working on scheduling PT and arranging transportation. Self-Care Activities - Self administers oral medications as prescribed - Self administers insulin as prescribed - Attends all scheduled provider appointments - Checks blood sugars as prescribed and utilize hyper and hypoglycemia protocol as needed - Adheres to prescribed ADA/carb modified Patient Goals: - check blood sugar at prescribed times - check blood sugar if I feel it is too high or too low - take the blood sugar log to all doctor visits - take the blood sugar meter to all doctor visits - follow up with PCP re: PT scheduling and attend all scheduled appointments  Follow Up Plan: Telephone follow up appointment with care management team member scheduled for:05/29/21 @ 1:30pm

## 2021-05-22 NOTE — Telephone Encounter (Signed)
..   Medicaid Managed Care   Unsuccessful Outreach Note  05/22/2021 Name: Scott Greer MRN: [id number] DOB: 07/30/1956  Referred by: [name], MD Reason for referral : High Risk Managed Medicaid (Attempted to reach Mr.Rohrbach today to get him scheduled for a phone visit with the LCSW. I left a message on his VM with my name and number.)   An unsuccessful telephone outreach was attempted today. The patient was referred to the case management team for assistance with care management and care coordination.   Follow Up Plan: The care management team will reach out to the patient again over the next 7-14 days.   Weston Settle Care Guide, High Risk Medicaid Managed Care Embedded Care Coordination [facility]  [facility]

## 2021-05-22 NOTE — Telephone Encounter (Signed)
Call placed to patient regarding outpatient PT.  Explained to him that the therapy clinic will be calling him with an appointment.  At that time, we can arrange transportation with [facility] or through [facility]. He said that he has used [employer] before and they have helped him get over the door threshold in his wheelchair.  He also noted that [employer] called him about the smaller wheelchair and told him that they do not have any in stock at this time and will contact him when they have one available.

## 2021-05-24 ENCOUNTER — Other Ambulatory Visit: Payer: Self-pay

## 2021-05-24 NOTE — Patient Outreach (Signed)
Due to SNF, patient's due date is altered. States he has 2 weeks left. Will schedule delivery for 06/07/21  Metformin   500mg   1  1   Ferrous Sulfate   325mg   1     Digoxin   0.125mg     1   Jardiance   10mg    1    Atorvastatin   10mg      1   Amiodarone   200mg    1    Aspirin Chw   81mg   1     Bidil   20/37.5mg  1 1 1    Potassium Cl Er    1     Spironolactone   25mg   1     Entresto   24-26mg   1  1   Carvedilol   3.125mg   1  1   Gabapentin   300mg   1 1 1    Vitamin D2   50,000 u  1     Torsemide   20mg   1

## 2021-05-25 DIAGNOSIS — Z89512 Acquired absence of left leg below knee: Secondary | ICD-10-CM | POA: Diagnosis not present

## 2021-05-25 DIAGNOSIS — E1152 Type 2 diabetes mellitus with diabetic peripheral angiopathy with gangrene: Secondary | ICD-10-CM | POA: Diagnosis not present

## 2021-05-25 NOTE — Telephone Encounter (Signed)
Pt is attempting to contact Jeninfer back.

## 2021-05-25 NOTE — Telephone Encounter (Signed)
Noted  

## 2021-05-26 DIAGNOSIS — E1152 Type 2 diabetes mellitus with diabetic peripheral angiopathy with gangrene: Secondary | ICD-10-CM | POA: Diagnosis not present

## 2021-05-27 DIAGNOSIS — E1152 Type 2 diabetes mellitus with diabetic peripheral angiopathy with gangrene: Secondary | ICD-10-CM | POA: Diagnosis not present

## 2021-05-28 DIAGNOSIS — E1152 Type 2 diabetes mellitus with diabetic peripheral angiopathy with gangrene: Secondary | ICD-10-CM | POA: Diagnosis not present

## 2021-05-29 ENCOUNTER — Other Ambulatory Visit: Payer: Self-pay | Admitting: *Deleted

## 2021-05-29 ENCOUNTER — Other Ambulatory Visit: Payer: Self-pay

## 2021-05-29 DIAGNOSIS — E1152 Type 2 diabetes mellitus with diabetic peripheral angiopathy with gangrene: Secondary | ICD-10-CM | POA: Diagnosis not present

## 2021-05-29 NOTE — Patient Outreach (Addendum)
Medicaid Managed Care   Nurse Care Manager Note  05/29/2021 Name:  Scott Greer MRN:  [id number] DOB:  12/03/1955  Scott Greer is an 65 y.o. year old male who is a primary patient of [name], MD.  The [facility] Managed Care Coordination team was consulted for assistance with:    HTN DMII and recent BKA  Scott Greer was given information about Medicaid Managed Care Coordination team services today. Scott Greer agreed to services and verbal consent obtained.  Engaged with patient by telephone for follow up visit in response to provider referral for case management and/or care coordination services.   Assessments/Interventions:  Review of past medical history, allergies, medications, health status, including review of consultants reports, laboratory and other test data, was performed as part of comprehensive evaluation and provision of chronic care management services.  SDOH (Social Determinants of Health) assessments and interventions performed:   Care Plan  Allergies  Allergen Reactions   Bee Venom Hives, Itching and Swelling    Medications Reviewed Today     Reviewed by Lane Hacker, [facility] (Pharmacist) on 05/24/21 at 1123  Med List Status: <None>   Medication Order Taking? Sig Documenting Provider Last Dose Status Informant  amiodarone (PACERONE) 200 MG tablet [id number] Yes Take 1 tablet (200 mg total) by mouth daily.  Patient taking differently: Take 200 mg by mouth daily with lunch.   [name], MD Taking Active Self  aspirin 81 MG chewable tablet [id number] Yes Chew 1 tablet (81 mg total) by mouth daily.  Patient taking differently: Chew 81 mg by mouth daily with breakfast.   [name], MD Taking Active   atorvastatin (LIPITOR) 10 MG tablet [id number] Yes Take 1 tablet (10 mg total) by mouth daily.  Patient taking differently: Take 10 mg by mouth daily with supper.   [name], MD Taking Active Self  Blood Glucose Monitoring Suppl  (TRUE METRIX METER) w/Device Drucie Opitz [id number]  Use as directed [name], MD  Active Other  carvedilol (COREG) 3.125 MG tablet [id number] Yes Take 1 tablet (3.125 mg total) by mouth 2 (two) times daily with a meal. [name], MD Taking Active Self  digoxin (LANOXIN) 0.125 MG tablet [id number] Yes Take 1 tablet (0.125 mg total) by mouth daily.  Patient taking differently: Take 0.125 mg by mouth daily with supper.   [name], MD Taking Active Self  empagliflozin (JARDIANCE) 10 MG TABS tablet [id number] Yes Take 1 tablet (10 mg total) by mouth daily.  Patient taking differently: Take 10 mg by mouth daily with lunch.   [name], MD Taking Active Self  ferrous sulfate 325 (65 FE) MG tablet [id number] Yes Take 1 tablet (325 mg total) by mouth daily with breakfast. [name], MD Taking Active Self  gabapentin (NEURONTIN) 300 MG capsule [id number] Yes TAKE ONE CAPSULE BY MOUTH THREE TIMES DAILY  Patient taking differently: Take 300 mg by mouth 3 (three) times daily with meals.   [name], MD Taking Active Self  glucose blood (TRUE METRIX BLOOD GLUCOSE TEST) test strip [id number]  Use as instructed [name], MD  Active Other  isosorbide-hydrALAZINE (BIDIL) 20-37.5 MG tablet [id number] Yes Take 1 tablet by mouth 3 (three) times daily with meals. [provider] Taking Active Self  metFORMIN (GLUCOPHAGE) 500 MG tablet [id number] Yes TAKE ONE TABLET BY MOUTH TWICE DAILY WITH A MEAL [name], MD Taking Active   nutrition supplement, JUVEN, (JUVEN)  PACK [id number]  Take 1 packet by mouth 2 (two) times daily between meals. Persons, Scott Greer, [name]  Active   Oxycodone HCl 10 MG TABS [id number]  Take 1 tablet (10 mg total) by mouth every 4 (four) hours as needed (pain score 7-10). Persons, Scott Greer, [name]  Active   potassium chloride SA (KLOR-CON) 20 MEQ tablet [id number] Yes TAKE 1 TABLET (20 MEQ) BY MOUTH DAILY.  Patient taking differently: Take 20 mEq  by mouth daily with breakfast.   [name], MD Taking Active Self  sacubitril-valsartan (ENTRESTO) 24-26 MG [id number] Yes Take 1 tablet by mouth 2 (two) times daily.  Patient taking differently: Take 1 tablet by mouth 2 (two) times daily with a meal.   [name], MD Taking Active Self  spironolactone (ALDACTONE) 25 MG tablet [id number] Yes TAKE 1 TABLET (25 MG TOTAL) BY MOUTH DAILY.  Patient taking differently: Take 25 mg by mouth daily with breakfast.   [name], MD Taking Active Self  torsemide (DEMADEX) 20 MG tablet [id number] Yes Take 1 tablet (20 mg total) by mouth daily. [name], MD Taking Active Self           Med Note Scott Greer   GDJ Apr 15, 2021  3:32 PM) Pt states he has a bottle for this - not in packaged meds from [facility]  TRUEplus Lancets [place] [id number]  Use as directed [name], MD  Active Other  Vitamin D, Ergocalciferol, (DRISDOL) 1.25 MG (50000 UNIT) CAPS capsule [id number] Yes Take 1 capsule (50,000 Units total) by mouth every 7 (seven) days.  Patient taking differently: Take 50,000 Units by mouth every Sunday.   [name], MD Taking Active   zinc sulfate 220 (50 Zn) MG capsule [id number]  Take 1 capsule (220 mg total) by mouth daily. 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[place]) 02/17/2021   Wound dehiscence    Gangrene of toe of left foot ([place])    Cutaneous abscess of left foot    Left foot infection 02/07/2021   Leukocytosis 02/07/2021   Thrombocytosis 02/07/2021   Hyponatremia 02/07/2021   AKI (acute kidney injury) ([place]) 02/07/2021   Hyperglycemia due to diabetes mellitus ([place]) 02/07/2021   Hypoalbuminemia due to protein-calorie  malnutrition ([place]) 02/07/2021   Osteomyelitis of second toe of left foot ([place])    New onset of congestive heart failure ([place]) 12/26/2020   CKD (chronic kidney disease) stage 3, GFR 30-59 ml/min (HCC) 81/44/8185   Acute systolic CHF (congestive heart failure) ([place]) 12/26/2020   Anemia, chronic disease 06/22/2020   Amputee, great toe, right ([place]) 06/20/2020   Normocytic anemia 06/20/2020   Erectile dysfunction associated with type 2 diabetes mellitus ([place]) 06/20/2020   Positive for macroalbuminuria 10/24/2019   Amputation of left great toe ([place]) 10/23/2019   Tobacco dependence 10/23/2019   Osteomyelitis of great toe of right foot ([place]) 06/04/2019   Diabetic foot infection ([place])    Noncompliance    Glaucoma    Hyperlipidemia    Essential hypertension 11/06/2003   Diabetes mellitus ([place]) 11/05/1997    Conditions to be addressed/monitored per PCP order:  HTN, DMII, and recent BKA  Care Plan : Diabetes Type 2 (Adult)  Updates made by [name], RN since 05/29/2021 12:00 AM     Problem: Disease Progression (Diabetes, Type 2)      Goal: Disease Progression Prevented or Minimized   Start Date: 05/22/2021  Expected End Date: 08/21/2021  Recent Progress: On track  Priority: High  Note:   Objective:  Lab Results  Component Value Date   HGBA1C 6.7 (H) 04/15/2021   Lab Results  Component Value Date   CREATININE 1.31 (H) 05/05/2021   CREATININE 1.21 04/28/2021   CREATININE 1.36 (H) 04/27/2021   Lab Results  Component Value Date   EGFR 86 04/07/2021   Current Barriers:  Knowledge Deficits related to basic Diabetes pathophysiology and self care/management-Mr. Heidinger had recent right BKA, 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 He reports falling this morning during transferring chairs. He is waiting to hear from Dr. Wynetta Emery, who is scheduling PT and arranging transportation. This RNCM will follow up next week and assist with scheduling if not scheduled. He reports having all medications  delivered and taking as directed. He met with someone from [place] and will start receiving a warm meal within the next two weeks.-Update-Mr. Casasola has not been contacted to schedule PT. [employer] fast streamer Social Support Does not adhere to provider recommendations re:  Unable to perform ADLs independently Unable to perform IADLs independently Does not contact provider office for questions/concerns Case Manager Clinical Goal(s):  patient will demonstrate improved adherence to prescribed treatment plan for diabetes self care/management as evidenced by: daily monitoring and recording of CBG  adherence to ADA/ carb modified diet adherence to prescribed medication regimen contacting provider for new or worsened symptoms or questions Interventions:  Inter-disciplinary care team collaboration (see longitudinal plan of care) Provided education to patient about basic DM disease process Discussed plans with patient for ongoing care management follow up and provided patient with direct contact information for care management team Reviewed scheduled/upcoming provider appointments including: working with PCP for scheduling PT Advised patient, providing education and rationale, to check cbg as directed and record, calling PCP for findings outside established parameters.   Referral made to social work team for assistance with managing emotions and financial resources Review of patient status, including review of consultants reports, relevant laboratory and other test results, and medications completed. Advised patient to call [facility] [phone] for health rewards offered monthly Discussed scheduling PT-Patient reports that Dr. Wynetta Emery is working on scheduling PT and arranging transportation.-RNCM contacted Outpatient Rehab [phone]) re: scheduling PT. The office will call Mr. Toste and schedule and discuss him arranging transportation to each appointment. Self-Care Activities - Self  administers oral medications as prescribed - Self administers insulin as prescribed - Attends all scheduled provider appointments - Checks blood sugars as prescribed and utilize hyper and hypoglycemia protocol as needed - Adheres to prescribed ADA/carb modified Patient Goals: - check blood sugar at prescribed times - check blood sugar if I feel it is too high or too low - take the blood sugar log to all doctor visits - take the blood sugar meter to all doctor visits - follow up with PCP re: PT scheduling and attend all scheduled appointments  Follow Up Plan: Telephone follow up appointment with care management team member scheduled for:06/21/21 @ 11:30am      Follow Up:  Patient agrees to Care Plan and Follow-up.  Plan: The Managed Medicaid care management team will reach out to the patient again over the  next 30 days.  Date/time of next scheduled RN care management/care coordination outreach:  06/21/21 @ 11:30am  [name] RN, [place] RN Care Coordinator

## 2021-05-29 NOTE — Patient Instructions (Signed)
Visit Information  Scott Greer was given information about Medicaid Managed Care team care coordination services as a part of their [facility] Medicaid benefit. Scott Greer verbally consented to engagement with the [facility] Managed Care team.   For questions related to your [facility] health plan, please call: [phone] or go here:[email or web address]  If you would like to schedule transportation through your [facility] plan, please call the following number at least 2 days in advance of your appointment: [phone].  Call the [place] at [phone], at any time, 24 hours a day, 7 days a week. If you are in danger or need immediate medical attention call 911.  If you would like help to quit smoking, call 1-800-QUIT-NOW (505)480-0045) OR Espaol: 1-855-Djelo-Ya (6-222-979-8921) o para ms informacin haga clic aqu or Text READY to 200-400 to register via text  Scott Greer - following are the goals we discussed in your visit today:   Goals Addressed             This Visit's Progress    Monitor and Manage My Blood Sugar-Diabetes Type 2       Timeframe:  Long-Range Goal Priority:  High Start Date:  05/22/21                           Expected End Date:  08/21/21                     Follow Up Date 06/21/2021    - check blood sugar at prescribed times - check blood sugar if I feel it is too high or too low - take the blood sugar log to all doctor visits - take the blood sugar meter to all doctor visits - follow up with PCP re: PT scheduling and attend all scheduled appointments    Why is this important?   Checking your blood sugar at home helps to keep it from getting very high or very low.  Writing the results in a diary or log helps the doctor know how to care for you.  Your blood sugar log should have the time, date and the results.  Also, write down the amount of insulin or other medicine that you take.  Other information, like  what you ate, exercise done and how you were feeling, will also be helpful.             Please see education materials related to amputation provided as print materials.   The patient verbalized understanding of instructions provided today and agreed to receive a mailed copy of patient instruction and/or educational materials.  Telephone follow up appointment with Managed Medicaid care management team member scheduled for:06/21/21 @ 11:30am  [name] RN, BSN [place] RN Care Coordinator   Following is a copy of your plan of care:  Patient Care Plan: General Plan of Care (Adult)  Completed 05/02/2021   Problem Identified: Health Promotion or Disease Self-Management (General Plan of Care) Resolved 05/02/2021  Note:   05/02/21-Patient discharged to SNF placement    Long-Range Goal: Self-Management Plan Developed Completed 05/02/2021  Start Date: 02/17/2021  Expected End Date: 05/19/2021  Recent Progress: On track  Priority: High  Note:   Current Barriers:  Ineffective Self Health Maintenance-Scott Greer 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 He has a rolling walker and manual wheelchair, he will begin having meal delivery today for 10 days, he  was approved for 14 hours/wk for PCS, [name], EMT is managing/preparing his medications, he is waiting on PT to be scheduled. He is concerned about his finanaces. He will no longer be able to work and is unsure how he can manage all of his bills.-Update-Scott Greer has not started PT at home. He is concerned about possible eviction and how to manage his bills. Scott Greer is receiving PCS at this time. He is working with Scott Greer for medication management and will have his medicines delivered and packaged to assist with compliance. Unable to independently manage health care related to recent BKA Does not attend all scheduled provider appointments Unable to perform ADLs independently Unable to perform  IADLs independently Does not contact provider office for questions/concerns Financial insecurity Currently UNABLE TO independently self manage needs related to chronic health conditions.  Knowledge Deficits related to short term plan for care coordination needs and long term plans for chronic disease management needs Patient discharged to SNF placement Nurse Case Manager Clinical Goal(s):  patient will work with care management team to address care coordination and chronic disease management needs related to recent BKA   Interventions:  Evaluation of current treatment plan related to BKA and patient's adherence to plan as established by provider. Discussed plans with patient for ongoing care management follow up and provided patient with direct contact information for care management team Provided patient with printed educational materials related to diabetic diet Discussed the importance of compliance with diabetic diet and keeping BS at normal levels for good healing Provided therapeutic listening Collaborated with [facility] regarding home PT services-RNCM is waiting for a return call from representative-will reach out to [facility] to follow up Encouraged patient to call today and follow up on resources provided by LCSW and BSW Reviewed upcoming scheduled appointments including 5/17 for wound check and 03/30/21 with PCP and discussed the importance of keeping all appointments Provided the number for [facility] for free phone benefit [phone], contact number for free medical transportation [phone] Self Care Activities:  Patient will self administer medications as prescribed Patient will attend all scheduled provider appointments Patient will call provider office for new concerns or questions Patient will work with BSW to address care coordination needs and will continue to work with the clinical team to address health care and disease management related needs.   Patient will work with MM  Pharmacist, Scott Greer for medication management Patient Goals: -call for [facility] for free phone benefit [phone] - begin a notebook of services in my neighborhood or community - call 211 when I need some help - follow-up on any referrals for help I am given - make a list of family or friends that I can call  - arrange a ride through an agency 1 week before appointment, call [phone] - ask family or friend for a ride - keep a calendar with appointment dates  Follow Up Plan: Telephone follow up appointment with care management team member scheduled for:04/20/21 @ 10:30am     Patient Care Plan: Medication Management     Problem Identified: Health Promotion or Disease Self-Management (General Plan of Care)      Goal: Medication Management Completed 05/03/2021  Note:   Current Barriers:  Unable to independently monitor therapeutic efficacy Unable to self administer medications as prescribed Does not adhere to prescribed medication regimen Does not maintain contact with provider office Does not contact provider office for questions/concerns   Pharmacist Clinical Goal(s):  Over the next 30 days, patient will contact provider office for questions/concerns  as evidenced notation of same in electronic health record through collaboration with PharmD and provider.    Interventions: Inter-disciplinary care team collaboration (see longitudinal plan of care) Comprehensive medication review performed; medication list updated in electronic medical record    Patient Goals/Self-Care Activities Over the next 30 days, patient will:  - take medications as prescribed  Follow Up Plan: The patient has been provided with contact information for the care management team and has been advised to call with any health related questions or concerns.       Patient Care Plan: Diabetes Type 2 (Adult)     Problem Identified: Disease Progression (Diabetes, Type 2)      Goal: Disease  Progression Prevented or Minimized   Start Date: 05/22/2021  Expected End Date: 08/21/2021  Recent Progress: On track  Priority: High  Note:   Objective:  Lab Results  Component Value Date   HGBA1C 6.7 (H) 04/15/2021   Lab Results  Component Value Date   CREATININE 1.31 (H) 05/05/2021   CREATININE 1.21 04/28/2021   CREATININE 1.36 (H) 04/27/2021   Lab Results  Component Value Date   EGFR 86 04/07/2021   Current Barriers:  Knowledge Deficits related to basic Diabetes pathophysiology and self care/management-Scott Greer had recent right BKA, 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 He reports falling this morning during transferring chairs. He is waiting to hear from Dr. Wynetta Emery, who is scheduling PT and arranging transportation. This RNCM will follow up next week and assist with scheduling if not scheduled. He reports having all medications delivered and taking as directed. He met with someone from [place] and will start receiving a warm meal within the next two weeks.-Update-Scott Greer has not been contacted to schedule PT. [employer] fast streamer Social Support Does not adhere to provider recommendations re:  Unable to perform ADLs independently Unable to perform IADLs independently Does not contact provider office for questions/concerns Case Manager Clinical Goal(s):  patient will demonstrate improved adherence to prescribed treatment plan for diabetes self care/management as evidenced by: daily monitoring and recording of CBG  adherence to ADA/ carb modified diet adherence to prescribed medication regimen contacting provider for new or worsened symptoms or questions Interventions:  Inter-disciplinary care team collaboration (see longitudinal plan of care) Provided education to patient about basic DM disease process Discussed plans with patient for ongoing care management follow up and provided patient with direct contact information for care management team Reviewed scheduled/upcoming  provider appointments including: working with PCP for scheduling PT Advised patient, providing education and rationale, to check cbg as directed and record, calling PCP for findings outside established parameters.   Referral made to social work team for assistance with managing emotions and financial resources Review of patient status, including review of consultants reports, relevant laboratory and other test results, and medications completed. Advised patient to call [facility] [phone] for health rewards offered monthly Discussed scheduling PT-Patient reports that Dr. Wynetta Emery is working on scheduling PT and arranging transportation.-RNCM contacted Outpatient Rehab [phone]) re: scheduling PT. The office will call Scott Greer and schedule and discuss him arranging transportation to each appointment. Self-Care Activities - Self administers oral medications as prescribed - Self administers insulin as prescribed - Attends all scheduled provider appointments - Checks blood sugars as prescribed and utilize hyper and hypoglycemia protocol as needed - Adheres to prescribed ADA/carb modified Patient Goals: - check blood sugar at prescribed times - check blood sugar if I feel it is too high or too low - take the blood sugar  log to all doctor visits - take the blood sugar meter to all doctor visits - follow up with PCP re: PT scheduling and attend all scheduled appointments  Follow Up Plan: Telephone follow up appointment with care management team member scheduled for:06/21/21 @ 11:30am

## 2021-05-30 ENCOUNTER — Other Ambulatory Visit: Payer: Self-pay | Admitting: Licensed Clinical Social Worker

## 2021-05-30 ENCOUNTER — Telehealth: Payer: Self-pay

## 2021-05-30 DIAGNOSIS — E1152 Type 2 diabetes mellitus with diabetic peripheral angiopathy with gangrene: Secondary | ICD-10-CM | POA: Diagnosis not present

## 2021-05-30 NOTE — Patient Outreach (Signed)
Medicaid Managed Care Social Work Note  05/30/2021 Name:  Scott Greer MRN:  [id number] DOB:  12-24-1955  Scott Greer is an 65 y.o. year old male who is a primary patient of [name], MD.  The Medicaid Managed Care Coordination team was consulted for assistance with:  [place] and Resources  Mr. Xiang was given information about Medicaid Managed CareCoordination services today. [name] agreed to services and verbal consent obtained.  Engaged with patient  for by telephone forinitial visit in response to referral for case management and/or care coordination services.   Assessments/Interventions:  Review of past medical history, allergies, medications, health status, including review of consultants reports, laboratory and other test data, was performed as part of comprehensive evaluation and provision of chronic care management services.  SDOH: (Social Determinant of Health) assessments and interventions performed: SDOH Interventions    Flowsheet Row Most Recent Value  SDOH Interventions   SDOH Interventions for the Following Domains Depression, Financial Strain, Housing, Stress  Financial Strain Interventions Other (Comment)  [Referral to [facility]]  Housing Interventions Intervention Not Indicated  [Referral to [facility]]  Stress Interventions Provide Counseling  Depression Interventions/Treatment  Patient refuses Treatment       Advanced Directives Status:  Not addressed in this encounter.  Care Plan                 Allergies  Allergen Reactions   Bee Venom Hives, Itching and Swelling    Medications Reviewed Today     Reviewed by Lane Hacker, [facility] (Pharmacist) on 05/24/21 at 1123  Med List Status: <None>   Medication Order Taking? Sig Documenting Provider Last Dose Status Informant  amiodarone (PACERONE) 200 MG tablet [id number] Yes Take 1 tablet (200 mg total) by mouth daily.  Patient taking differently: Take 200 mg by mouth daily  with lunch.   [name], MD Taking Active Self  aspirin 81 MG chewable tablet [id number] Yes Chew 1 tablet (81 mg total) by mouth daily.  Patient taking differently: Chew 81 mg by mouth daily with breakfast.   [name], MD Taking Active   atorvastatin (LIPITOR) 10 MG tablet [id number] Yes Take 1 tablet (10 mg total) by mouth daily.  Patient taking differently: Take 10 mg by mouth daily with supper.   [name], MD Taking Active Self  Blood Glucose Monitoring Suppl (TRUE METRIX METER) w/Device Drucie Opitz [id number]  Use as directed [name], MD  Active Other  carvedilol (COREG) 3.125 MG tablet [id number] Yes Take 1 tablet (3.125 mg total) by mouth 2 (two) times daily with a meal. [name], MD Taking Active Self  digoxin (LANOXIN) 0.125 MG tablet [id number] Yes Take 1 tablet (0.125 mg total) by mouth daily.  Patient taking differently: Take 0.125 mg by mouth daily with supper.   [name], MD Taking Active Self  empagliflozin (JARDIANCE) 10 MG TABS tablet [id number] Yes Take 1 tablet (10 mg total) by mouth daily.  Patient taking differently: Take 10 mg by mouth daily with lunch.   [name], MD Taking Active Self  ferrous sulfate 325 (65 FE) MG tablet [id number] Yes Take 1 tablet (325 mg total) by mouth daily with breakfast. [name], MD Taking Active Self  gabapentin (NEURONTIN) 300 MG capsule [id number] Yes TAKE ONE CAPSULE BY MOUTH THREE TIMES DAILY  Patient taking differently: Take 300 mg by mouth 3 (three) times daily with meals.   [name],  MD Taking Active Self  glucose blood (TRUE METRIX BLOOD GLUCOSE TEST) test strip [id number]  Use as instructed [name], MD  Active Other  isosorbide-hydrALAZINE (BIDIL) 20-37.5 MG tablet [id number] Yes Take 1 tablet by mouth 3 (three) times daily with meals. [provider] Taking Active Self  metFORMIN (GLUCOPHAGE) 500 MG tablet [id number] Yes TAKE ONE TABLET BY MOUTH TWICE  DAILY WITH A MEAL [name], MD Taking Active   nutrition supplement, [name]) PACK [id number]  Take 1 packet by mouth 2 (two) times daily between meals. Persons, [name], [name]  Active   Oxycodone HCl 10 MG TABS [id number]  Take 1 tablet (10 mg total) by mouth every 4 (four) hours as needed (pain score 7-10). Persons, [name], [name]  Active   potassium chloride SA (KLOR-CON) 20 MEQ tablet [id number] Yes TAKE 1 TABLET (20 MEQ) BY MOUTH DAILY.  Patient taking differently: Take 20 mEq by mouth daily with breakfast.   [name], MD Taking Active Self  sacubitril-valsartan (ENTRESTO) 24-26 MG [id number] Yes Take 1 tablet by mouth 2 (two) times daily.  Patient taking differently: Take 1 tablet by mouth 2 (two) times daily with a meal.   [name], MD Taking Active Self  spironolactone (ALDACTONE) 25 MG tablet [id number] Yes TAKE 1 TABLET (25 MG TOTAL) BY MOUTH DAILY.  Patient taking differently: Take 25 mg by mouth daily with breakfast.   [name], MD Taking Active Self  torsemide (DEMADEX) 20 MG tablet [id number] Yes Take 1 tablet (20 mg total) by mouth daily. [name], MD Taking Active Self           Med Note Payton Doughty   VXB Apr 15, 2021  3:32 PM) Pt states he has a bottle for this - not in packaged meds from [facility]  TRUEplus Lancets [place] [id number]  Use as directed [name], MD  Active Other  Vitamin D, Ergocalciferol, (DRISDOL) 1.25 MG (50000 UNIT) CAPS capsule [id number] Yes Take 1 capsule (50,000 Units total) by mouth every 7 (seven) days.  Patient taking differently: Take 50,000 Units by mouth every Sunday.   [name], MD Taking Active   zinc sulfate 220 (50 Zn) MG capsule [id number]  Take 1 capsule (220 mg total) by mouth daily. 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[place]) 02/17/2021   Wound dehiscence    Gangrene of toe of left foot (HCC)    Cutaneous abscess of left foot    Left foot infection 02/07/2021   Leukocytosis 02/07/2021   Thrombocytosis 02/07/2021   Hyponatremia 02/07/2021   AKI (acute kidney injury) ([place]) 02/07/2021   Hyperglycemia due to diabetes mellitus ([place]) 02/07/2021   Hypoalbuminemia due to protein-calorie malnutrition ([place]) 02/07/2021   Osteomyelitis of second toe of left foot ([place])    New onset of congestive heart failure ([place]) 12/26/2020   CKD (chronic kidney disease) stage 3, GFR 30-59 ml/min (HCC) 63/89/3734   Acute systolic CHF (congestive heart failure) ([place]) 12/26/2020   Anemia, chronic disease 06/22/2020   Amputee, great toe, right ([place]) 06/20/2020   Normocytic anemia 06/20/2020   Erectile  dysfunction associated with type 2 diabetes mellitus ([place]) 06/20/2020   Positive for macroalbuminuria 10/24/2019   Amputation of left great toe ([place]) 10/23/2019   Tobacco dependence 10/23/2019   Osteomyelitis of great toe of right foot ([place]) 06/04/2019   Diabetic foot infection ([place])    Noncompliance    Glaucoma    Hyperlipidemia    Essential hypertension 11/06/2003   Diabetes mellitus ([place]) 11/05/1997    Conditions to be addressed/monitored per PCP order:   Emotional stressors  Care Plan : LCSW Plan of Care  Updates made by Greg Cutter, LCSW since 05/30/2021 12:00 AM     Long-Range Goal: Depressive Symptoms Identified   Start Date: 05/30/2021  This Visit's Progress: On track  Priority: High  Note:   Timeframe:  Long-Range Goal Priority:  High Start Date:  05/30/21                        Expected End Date:  ongoing                   Follow Up Date 06/19/2021   Current Barriers:  Acute Mental Health needs related to stress related to recent right BKA Financial constraints related to housing,  Limited social support, ADL IADL limitations, Mental Health Concerns , and Social Isolation Suicidal Ideation/Homicidal Ideation: No  Clinical Social Work Goal(s):  Over the next 120 days, patient will work with SW by telephone to reduce or manage symptoms related to stress patient will work with [facility] BSW to address needs related to financial strain patient will demonstrate improved health management independence as evidenced by increasing self-care and implementing healthy coping skills to reeducate stress  Interventions: Patient interviewed and appropriate assessments performed: brief mental health assessment 65 year old male patient with a recent right BKA is having difficulty adjusting to his new way of life and experiencing financial strain. Patient will be referred to [facility] BSW for community resource support on 05/30/21 Patient has a strong support network which includes his ex spouse, CNA and neighbors. Patient reports that one of his neighbors' sons will assist him with various task like running errands or taking out his trash which is helpful to him. Patient's CNA comes daily for 2 hours in the morning and his ex spouse checks on him daily as well. Positive reinforcement provided for his current health care management  Patient was educated on available mental health resources but patient declined needing this implementation at this time and will notify [facility] LCSW if this changes. Patient is two months behind in rent at this time.  Patient is on the wait list for [employer] program.  SDOH Interventions    Flowsheet Row Most Recent Value  SDOH Interventions   SDOH Interventions for the Following Domains Depression, Financial Strain, Housing, Stress  Financial Strain Interventions Other (Comment)  [Referral to [facility] BSW]  Housing Interventions Intervention Not Indicated  [Referral to [facility] BSW]  Stress Interventions Provide Counseling  Depression  Interventions/Treatment  Patient refuses Treatment      Patient interviewed and appropriate assessments performed Provided patient with information about available mental health resources and healthy coping skills to help reduce stressors Discussed plans with patient for ongoing care management follow up and provided patient with direct contact information for care management team Solution-Focused Strategies, Active listening / Reflection utilized , Emotional Supportive Provided, and Participation in counseling encouraged   Patient Self Care Activities:  Self administers medications as prescribed  Attends all scheduled provider appointments Calls provider office for new concerns or questions Ability for insight Independent living Motivation for treatment Strong family or social support  Patient Coping Strengths:  Supportive Relationships Friends Hopefulness Self Advocate Able to Communicate Effectively  Patient Self Care Deficits:  Lacks social connections  Initial goal documentation     Care Plan : General Plan of Care (Adult)  Updates made by Greg Cutter, LCSW since 05/30/2021 12:00 AM     Problem: Coping Skills (General Plan of Care)      Goal: Coping Skills Enhanced   Note:   Evidence-based guidance:  Acknowledge, normalize and validate difficulty of making life-long lifestyle changes.  Identify current effective and ineffective coping strategies.  Encourage patient and caregiver participation in care to increase self-esteem, confidence and feelings of control.  Consider alternative and complementary therapy approaches such as meditation, mindfulness or yoga.  Encourage participation in cognitive behavioral therapy to foster a positive identity, increase self-awareness, as well as bolster self-esteem, confidence and self-efficacy.  Discuss spirituality; be present as concerns are identified; encourage journaling, prayer, worship services, meditation or pastoral  counseling.  Encourage participation in pleasurable group activities such as hobbies, singing, sports or volunteering).  Encourage the use of mindfulness; refer for training or intensive intervention.  Consider the use of meditative movement therapy such as tai chi, yoga or qigong.  Promote a regular daily exercise program based on tolerance, ability and patient choice to support positive thinking about disease or aging.   Notes:      Follow up:  Patient agrees to Care Plan and Follow-up.  Plan: The Managed Medicaid care management team will reach out to the patient again over the next 30 days.  Date/time of next scheduled Social Work care management/care coordination outreach:  06/19/21 at 1:00 pm.  Eula Fried, BSW, MSW, [place] Medicaid LCSW [email or web address] Phone: [phone]

## 2021-05-30 NOTE — Patient Instructions (Signed)
Visit Information  Mr. Perea was given information about Medicaid Managed Care team care coordination services as a part of their [facility] Medicaid benefit. [name] verbally consented to engagement with the [facility] Managed Care team.   If you are experiencing a medical emergency, please call 911 or report to your local emergency department or urgent care.   If you have a non-emergency medical problem during routine business hours, please contact your provider's office and ask to speak with a nurse.   For questions related to your [facility] health plan, please call: [phone] or go here:[email or web address]  If you would like to schedule transportation through your [facility] plan, please call the following number at least 2 days in advance of your appointment: [phone].  Call the [facility] Crisis Line at [phone], at any time, 24 hours a day, 7 days a week. If you are in danger or need immediate medical attention call 911.  If you would like help to quit smoking, call 1-800-QUIT-NOW (603-224-8348) OR Espaol: 1-855-Djelo-Ya (9-381-017-5102) o para ms informacin haga clic aqu or Text READY to 585-277 to register via text  Mr. Seibold - following are the goals we discussed in your visit today:   Goals Addressed             This Visit's Progress    Manage My Emotions       Timeframe:  Long-Range Goal Priority:  High Start Date:  05/30/21                        Expected End Date:  ongoing                   Follow Up Date 06/19/2021    - begin personal counseling - call and visit an old friend - check out volunteer opportunities - join a support group - laugh; watch a funny movie or comedian - learn and use visualization or guided imagery - perform a random act of kindness - practice relaxation or meditation daily - start or continue a personal journal - talk about feelings with a friend, family or spiritual advisor -  practice positive thinking and self-talk    Why is this important?   When you are stressed, down or upset, your body reacts too.  For example, your blood pressure may get higher; you may have a headache or stomachache.  When your emotions get the best of you, your body's ability to fight off cold and flu gets weak.  These steps will help you manage your emotions.            Patient verbalizes understanding of instructions provided today.   Social Worker will contact patient for follow up on 06/19/21.   Dickie La, BSW, MSW, [employer] Managed Medicaid LCSW [facility]  [facility] [email or web address] Phone: [phone]

## 2021-05-30 NOTE — Telephone Encounter (Signed)
[facility] had informed me that they are not able to accept the referral.  The patient has been referred to outpatient therapy because there are no home health agencies that could accept the referral

## 2021-05-30 NOTE — Telephone Encounter (Signed)
Copied from CRM [id number]. Topic: General - Other >> May 17, 2021  5:32 PM Glean Salen wrote: Reason for [id number] from [place], can't fullfill home health order because they are not accepting medicaid. Call back number. [phone] >> May 30, 2021 11:36 AM Maryjean Morn, CMA wrote: Please fax orders some where else.

## 2021-05-30 NOTE — Telephone Encounter (Signed)
Will forward to Jane 

## 2021-05-31 ENCOUNTER — Other Ambulatory Visit: Payer: Self-pay | Admitting: *Deleted

## 2021-05-31 ENCOUNTER — Other Ambulatory Visit: Payer: Self-pay

## 2021-05-31 ENCOUNTER — Telehealth: Payer: Self-pay | Admitting: Orthopedic Surgery

## 2021-05-31 DIAGNOSIS — E1152 Type 2 diabetes mellitus with diabetic peripheral angiopathy with gangrene: Secondary | ICD-10-CM | POA: Diagnosis not present

## 2021-05-31 MED ORDER — OXYCODONE HCL 10 MG PO TABS: 10.0000 mg | ORAL_TABLET | Freq: Four times a day (QID) | ORAL | 0 refills | Status: DC | PRN

## 2021-05-31 NOTE — Patient Instructions (Signed)
Visit Information  Mr. Wence was given information about Medicaid Managed Care team care coordination services as a part of their [facility] Medicaid benefit. [name] verbally consented to engagement with the [facility] Managed Care team.   If you are experiencing a medical emergency, please call 911 or report to your local emergency department or urgent care.   If you have a non-emergency medical problem during routine business hours, please contact your provider's office and ask to speak with a nurse.   For questions related to your [facility] health plan, please call: [phone] or go here:[email or web address]  If you would like to schedule transportation through your [facility] plan, please call the following number at least 2 days in advance of your appointment: [phone].  Call the [facility] at [phone], at any time, 24 hours a day, 7 days a week. If you are in danger or need immediate medical attention call 911.  If you would like help to quit smoking, call 1-800-QUIT-NOW 307-460-2390) OR Espaol: 1-855-Djelo-Ya (3-568-616-8372) o para ms informacin haga clic aqu or Text READY to 200-400 to register via text  Scott Greer - following are the goals we discussed in your visit today:   Goals Addressed             This Visit's Progress    Monitor and Manage My Blood Sugar-Diabetes Type 2       Timeframe:  Long-Range Goal Priority:  High Start Date:  05/22/21                           Expected End Date:  08/21/21                     Follow Up Date 06/21/2021    - attend all scheduled appointments:Dr. Duda 8/4, PT evaluation 8/10 and PCP 8/17 - call to arrange transportation to scheduled appointments - call [facility] [phone] for healthy benefits and benefit for a free phone - check blood sugar at prescribed times - check blood sugar if I feel it is too high or too low - take the blood sugar log to all doctor  visits - take the blood sugar meter to all doctor visits   Why is this important?   Checking your blood sugar at home helps to keep it from getting very high or very low.  Writing the results in a diary or log helps the doctor know how to care for you.  Your blood sugar log should have the time, date and the results.  Also, write down the amount of insulin or other medicine that you take.  Other information, like what you ate, exercise done and how you were feeling, will also be helpful.             Please see education materials related to diabetes provided by MyChart link.  Patient verbalizes understanding of instructions provided today.   Telephone follow up appointment with Managed Medicaid care management team member scheduled for:06/21/21 @ 11:30am  [name] RN, BSN [place] RN Care Coordinator   Following is a copy of your plan of care:  Patient Care Plan: General Plan of Care (Adult)  Completed 05/02/2021   Problem Identified: Health Promotion or Disease Self-Management (General Plan of Care) Resolved 05/02/2021  Note:   05/02/21-Patient discharged to SNF placement    Long-Range Goal: Self-Management Plan Developed Completed 05/02/2021  Start Date: 02/17/2021  Expected End Date: 05/19/2021  Recent Progress: On track  Priority: High  Note:   Current Barriers:  Ineffective Self Health Maintenance-Mr. 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 He has a rolling walker and manual wheelchair, he will begin having meal delivery today for 10 days, he was approved for 14 hours/wk for PCS, [name], EMT is managing/preparing his medications, he is waiting on PT to be scheduled. He is concerned about his finanaces. He will no longer be able to work and is unsure how he can manage all of his bills.-Update-Mr. Walsh has not started PT at home. He is concerned about possible eviction and how to manage his bills. Mr. Jeanpaul is  receiving PCS at this time. He is working with [name] for medication management and will have his medicines delivered and packaged to assist with compliance. Unable to independently manage health care related to recent BKA Does not attend all scheduled provider appointments Unable to perform ADLs independently Unable to perform IADLs independently Does not contact provider office for questions/concerns Financial insecurity Currently UNABLE TO independently self manage needs related to chronic health conditions.  Knowledge Deficits related to short term plan for care coordination needs and long term plans for chronic disease management needs Patient discharged to SNF placement Nurse Case Manager Clinical Goal(s):  patient will work with care management team to address care coordination and chronic disease management needs related to recent BKA   Interventions:  Evaluation of current treatment plan related to BKA and patient's adherence to plan as established by provider. Discussed plans with patient for ongoing care management follow up and provided patient with direct contact information for care management team Provided patient with printed educational materials related to diabetic diet Discussed the importance of compliance with diabetic diet and keeping BS at normal levels for good healing Provided therapeutic listening Collaborated with [facility] regarding home PT services-RNCM is waiting for a return call from representative-will reach out to [facility] to follow up Encouraged patient to call today and follow up on resources provided by LCSW and BSW Reviewed upcoming scheduled appointments including 5/17 for wound check and 03/30/21 with PCP and discussed the importance of keeping all appointments Provided the number for [facility] for free phone benefit [phone], contact number for free medical transportation [phone] Self Care Activities:  Patient will self administer medications as  prescribed Patient will attend all scheduled provider appointments Patient will call provider office for new concerns or questions Patient will work with BSW to address care coordination needs and will continue to work with the clinical team to address health care and disease management related needs.   Patient will work with [facility] Pharmacist, [name] for medication management Patient Goals: -call for [facility] for free phone benefit [phone] - begin a notebook of services in my neighborhood or community - call 211 when I need some help - follow-up on any referrals for help I am given - make a list of family or friends that I can call  - arrange a ride through an agency 1 week before appointment, call [phone] - ask family or friend for a ride - keep a calendar with appointment dates  Follow Up Plan: Telephone follow up appointment with care management team member scheduled for:04/20/21 @ 10:30am     Patient Care Plan: Medication Management     Problem Identified: Health Promotion or Disease Self-Management (General Plan of Care)      Goal: Medication Management Completed 05/03/2021  Note:   Current Barriers:  Unable to independently monitor therapeutic efficacy Unable to self administer medications as prescribed Does not adhere to prescribed medication regimen Does not maintain contact with provider office Does not contact provider office for questions/concerns   Pharmacist Clinical Goal(s):  Over the next 30 days, patient will contact provider office for questions/concerns as evidenced notation of same in electronic health record through collaboration with PharmD and provider.    Interventions: Inter-disciplinary care team collaboration (see longitudinal plan of care) Comprehensive medication review performed; medication list updated in electronic medical record    Patient Goals/Self-Care Activities Over the next 30 days, patient will:  - take medications as  prescribed  Follow Up Plan: The patient has been provided with contact information for the care management team and has been advised to call with any health related questions or concerns.       Patient Care Plan: LCSW Plan of Care     Problem Identified: Resources Resolved 05/02/2021  Onset Date: 03/15/2021  Note:   Scott Greer is a 65 y.o. year old male who sees [name], MD for primary care. The  [facility] Managed Care team was consulted for assistance with rent and utilities. Mr. Silliman was given information about Care Management services, agreed to services, and verbal consent for services was obtained.  Interventions:  Patient interviewed and appropriate assessments performed Collaborated with clinical team regarding patient needs  SDOH (Social Determinants of Health) assessments performed: Yes     Provided patient with information about DSS utility assistance program. Patient stated that another social worker has completed the application for him and he has not heard anything back from them. Patient states he completed an application with the [employer] and has not heard from them. Patient stated he would be contacting the Welfare Reform today.  Patient did receive a list of churches from [employer], patient stated he did contact the churches but some he was not able to get through and some did not have any funds. Patient states he is $1300 behind in rent. Patient currently receives foodstamps. BSW contacted [facility] at [phone] and was unable to leave a voicemail. BSW will continue to research for rental assistance programs. Update 03/21/21: BSW received a telephone call from patient stating he thinks he is going to be put out. BSW asked if he has received a letter stating that, patient stated no he has not. BSW contacted [employer] and rep stated they are taking applications, but they are behind about 14 business  days, rep stated applications do have to be completed and submitted online. BSW completed and submited application on behalf of patient.  05/02/21: BSW closing case. Patient has been placed in SNF.  Plan:  Over the next 30-90 days, patient will work with BSW to address needs related to Housing barriers Education officer, museum will follow up in 14 days.   Mickel Fuchs, BSW, [place]  High Risk Managed Medicaid Team  [phone]         Problem Identified: Depression Identification (Depression)      Problem Identified: Depression Identification (Depression)      Long-Range Goal: Depressive Symptoms Identified   Start Date: 05/30/2021  This Visit's Progress: On track  Priority: High  Note:   Timeframe:  Long-Range Goal Priority:  High Start Date:  05/30/21  Expected End Date:  ongoing                   Follow Up Date 06/19/2021   Current Barriers:  Acute Mental Health needs related to stress related to recent right BKA Financial constraints related to housing, Limited social support, ADL IADL limitations, Mental Health Concerns , and Social Isolation Suicidal Ideation/Homicidal Ideation: No  Clinical Social Work Goal(s):  Over the next 120 days, patient will work with SW by telephone to reduce or manage symptoms related to stress patient will work with [facility] BSW to address needs related to financial strain patient will demonstrate improved health management independence as evidenced by increasing self-care and implementing healthy coping skills to reeducate stress  Interventions: Patient interviewed and appropriate assessments performed: brief mental health assessment 65 year old male patient with a recent right BKA is having difficulty adjusting to his new way of life and experiencing financial strain. Patient will be referred to [facility] BSW for community resource support on 05/30/21 Patient has a strong support network which  includes his ex spouse, CNA and neighbors. Patient reports that one of his neighbors' sons will assist him with various task like running errands or taking out his trash which is helpful to him. Patient's CNA comes daily for 2 hours in the morning and his ex spouse checks on him daily as well. Positive reinforcement provided for his current health care management  Patient was educated on available mental health resources but patient declined needing this implementation at this time and will notify [facility] LCSW if this changes. Patient is two months behind in rent at this time.  Patient is on the wait list for [employer] program.  SDOH Interventions    Flowsheet Row Most Recent Value  SDOH Interventions   SDOH Interventions for the Following Domains Depression, Financial Strain, Housing, Stress  Financial Strain Interventions Other (Comment)  [Referral to [facility] BSW]  Housing Interventions Intervention Not Indicated  [Referral to [name] BSW]  Stress Interventions Provide Counseling  Depression Interventions/Treatment  Patient refuses Treatment      Patient interviewed and appropriate assessments performed Provided patient with information about available mental health resources and healthy coping skills to help reduce stressors Discussed plans with patient for ongoing care management follow up and provided patient with direct contact information for care management team Solution-Focused Strategies, Active listening / Reflection utilized , Emotional Supportive Provided, and Participation in counseling encouraged   Patient Self Care Activities:  Self administers medications as prescribed Attends all scheduled provider appointments Calls provider office for new concerns or questions Ability for insight Independent living Motivation for treatment Strong family or social support  Patient Coping Strengths:  Supportive Relationships Friends Hopefulness Self  Advocate Able to Communicate Effectively  Patient Self Care Deficits:  Lacks social connections  Initial goal documentation     Patient Care Plan: Diabetes Type 2 (Adult)     Problem Identified: Disease Progression (Diabetes, Type 2)      Goal: Disease Progression Prevented or Minimized   Start Date: 05/22/2021  Expected End Date: 08/21/2021  Recent Progress: On track  Priority: High  Note:   Objective:  Lab Results  Component Value Date   HGBA1C 6.7 (H) 04/15/2021   Lab Results  Component Value Date   CREATININE 1.31 (H) 05/05/2021   CREATININE 1.21 04/28/2021   CREATININE 1.36 (H) 04/27/2021   Lab Results  Component Value Date   EGFR 86 04/07/2021   Current Barriers:  Knowledge Deficits related to basic Diabetes pathophysiology and self care/management-Mr. 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 He reports falling this morning during transferring chairs. He is waiting to hear from Dr. Wynetta Emery, who is scheduling PT and arranging transportation. This RNCM will follow up next week and assist with scheduling if not scheduled. He reports having all medications delivered and taking as directed. He met with someone from [place] and will start receiving a warm meal within the next two weeks.-Update-Mr. Pinder called RNCM after receiving AVS in the mail. He had questions regarding PT appointment. Mr. Damian accidentally deleted saved contacts in his phone.  Film/video editor Limited Social Support Does not adhere to provider recommendations re:  Unable to perform ADLs independently Unable to perform IADLs independently Does not contact provider office for questions/concerns Case Manager Clinical Goal(s):  patient will demonstrate improved adherence to prescribed treatment plan for diabetes self care/management as evidenced by: daily monitoring and recording of CBG  adherence to ADA/ carb modified diet adherence to prescribed medication regimen contacting  provider for new or worsened symptoms or questions Interventions:  Inter-disciplinary care team collaboration (see longitudinal plan of care) Provided education to patient about basic DM disease process Discussed plans with patient for ongoing care management follow up and provided patient with direct contact information for care management team Reviewed scheduled/upcoming provider appointments including: Dr. Sharol Given 8/4, PT evaluation 8/10 and PCP 8/17 Advised patient, providing education and rationale, to check cbg as directed and record, calling PCP for findings outside established parameters.   Review of patient status, including review of consultants reports, relevant laboratory and other test results, and medications completed. Advised patient to call [facility] [phone] for health rewards offered monthly and free phone benefit Provided contact information to [place] and Nutrition [phone] Self-Care Activities - Self administers oral medications as prescribed - Self administers insulin as prescribed - Attends all scheduled provider appointments - Checks blood sugars as prescribed and utilize hyper and hypoglycemia protocol as needed - Adheres to prescribed ADA/carb modified Patient Goals: - attend all scheduled appointments:Dr. Duda 8/4, PT evaluation 8/10 and PCP 8/17 - call to arrange transportation to scheduled appointments - call [facility] [phone] for healthy benefits and benefit for a free phone - check blood sugar at prescribed times - check blood sugar if I feel it is too high or too low - take the blood sugar log to all doctor visits - take the blood sugar meter to all doctor visits Follow Up Plan: Telephone follow up appointment with care management team member scheduled for:06/21/21 @ 11:30am     Patient Care Plan: General Plan of Care (Adult)     Problem Identified: Coping Skills (General Plan of Care)      Goal: Coping Skills Enhanced    Note:   Evidence-based guidance:  Acknowledge, normalize and validate difficulty of making life-long lifestyle changes.  Identify current effective and ineffective coping strategies.  Encourage patient and caregiver participation in care to increase self-esteem, confidence and feelings of control.  Consider alternative and complementary therapy approaches such as meditation, mindfulness or yoga.  Encourage participation in cognitive behavioral therapy to foster a positive identity, increase self-awareness, as well as bolster self-esteem, confidence and self-efficacy.  Discuss spirituality; be present as concerns are identified; encourage journaling, prayer, worship services, meditation or pastoral counseling.  Encourage participation in pleasurable group activities such as hobbies, singing, sports or volunteering).  Encourage the use of mindfulness; refer for training or intensive intervention.  Consider the use of meditative movement therapy such as tai chi, yoga or qigong.  Promote a regular daily exercise program based on tolerance, ability and patient choice to support positive thinking about disease or aging.   Notes:

## 2021-05-31 NOTE — Telephone Encounter (Signed)
Pt states he is in a lot of pain. He would like to know if you  can refill his oxycodone and he would like it delivered.   CB [phone]

## 2021-05-31 NOTE — Telephone Encounter (Signed)
Pt called and would like a refill on his pain medication. He would like also know if you could do the delivery? He would like to speak with a nurse.   CB [phone]

## 2021-05-31 NOTE — Telephone Encounter (Signed)
S/p right BKA 04/26/21 requesting refill on Oxycodone 10 mg last refill was 05/11/21 # 30 if approved would like medication delivered from pharmacy.

## 2021-05-31 NOTE — Patient Outreach (Signed)
Medicaid Managed Care   Nurse Care Manager Note  05/31/2021 Name:  ALEK BORGES MRN:  [id number] DOB:  [birth year]  TREYLEN GIBBS is an 65 y.o. year old male who is a primary patient of [name], MD.  The [facility] Managed Care Coordination team was consulted for assistance with:    CHF DMII and recent BKA  Mr. Keenum was given information about Medicaid Managed Care Coordination team services today. [name] agreed to services and verbal consent obtained.  Engaged with patient by telephone for follow up visit in response to provider referral for case management and/or care coordination services.   Assessments/Interventions:  Review of past medical history, allergies, medications, health status, including review of consultants reports, laboratory and other test data, was performed as part of comprehensive evaluation and provision of chronic care management services.  SDOH (Social Determinants of Health) assessments and interventions performed:   Care Plan  Allergies  Allergen Reactions   Bee Venom Hives, Itching and Swelling    Medications Reviewed Today     Reviewed by Lane Hacker, [facility] (Pharmacist) on 05/24/21 at 1123  Med List Status: <None>   Medication Order Taking? Sig Documenting Provider Last Dose Status Informant  amiodarone (PACERONE) 200 MG tablet [id number] Yes Take 1 tablet (200 mg total) by mouth daily.  Patient taking differently: Take 200 mg by mouth daily with lunch.   [name], MD Taking Active Self  aspirin 81 MG chewable tablet [id number] Yes Chew 1 tablet (81 mg total) by mouth daily.  Patient taking differently: Chew 81 mg by mouth daily with breakfast.   [name], MD Taking Active   atorvastatin (LIPITOR) 10 MG tablet [id number] Yes Take 1 tablet (10 mg total) by mouth daily.  Patient taking differently: Take 10 mg by mouth daily with supper.   [name], MD Taking Active Self  Blood Glucose Monitoring Suppl  (TRUE METRIX METER) w/Device Drucie Opitz [id number]  Use as directed [name], MD  Active Other  carvedilol (COREG) 3.125 MG tablet [id number] Yes Take 1 tablet (3.125 mg total) by mouth 2 (two) times daily with a meal. [name], MD Taking Active Self  digoxin (LANOXIN) 0.125 MG tablet [id number] Yes Take 1 tablet (0.125 mg total) by mouth daily.  Patient taking differently: Take 0.125 mg by mouth daily with supper.   [name], MD Taking Active Self  empagliflozin (JARDIANCE) 10 MG TABS tablet [id number] Yes Take 1 tablet (10 mg total) by mouth daily.  Patient taking differently: Take 10 mg by mouth daily with lunch.   [name], MD Taking Active Self  ferrous sulfate 325 (65 FE) MG tablet [id number] Yes Take 1 tablet (325 mg total) by mouth daily with breakfast. [name], MD Taking Active Self  gabapentin (NEURONTIN) 300 MG capsule [id number] Yes TAKE ONE CAPSULE BY MOUTH THREE TIMES DAILY  Patient taking differently: Take 300 mg by mouth 3 (three) times daily with meals.   [name], MD Taking Active Self  glucose blood (TRUE METRIX BLOOD GLUCOSE TEST) test strip [id number]  Use as instructed [name], MD  Active Other  isosorbide-hydrALAZINE (BIDIL) 20-37.5 MG tablet [id number] Yes Take 1 tablet by mouth 3 (three) times daily with meals. [provider] Taking Active Self  metFORMIN (GLUCOPHAGE) 500 MG tablet [id number] Yes TAKE ONE TABLET BY MOUTH TWICE DAILY WITH A MEAL [name], MD Taking Active   nutrition supplement, JUVEN, (JUVEN)  PACK [id number]  Take 1 packet by mouth 2 (two) times daily between meals. Persons, [name], [name]  Active   Oxycodone HCl 10 MG TABS [id number]  Take 1 tablet (10 mg total) by mouth every 4 (four) hours as needed (pain score 7-10). Persons, [name], [name]  Active   potassium chloride SA (KLOR-CON) 20 MEQ tablet [id number] Yes TAKE 1 TABLET (20 MEQ) BY MOUTH DAILY.  Patient taking differently: Take 20 mEq  by mouth daily with breakfast.   [name], MD Taking Active Self  sacubitril-valsartan (ENTRESTO) 24-26 MG [id number] Yes Take 1 tablet by mouth 2 (two) times daily.  Patient taking differently: Take 1 tablet by mouth 2 (two) times daily with a meal.   [name], MD Taking Active Self  spironolactone (ALDACTONE) 25 MG tablet [id number] Yes TAKE 1 TABLET (25 MG TOTAL) BY MOUTH DAILY.  Patient taking differently: Take 25 mg by mouth daily with breakfast.   [name], MD Taking Active Self  torsemide (DEMADEX) 20 MG tablet [id number] Yes Take 1 tablet (20 mg total) by mouth daily. [name], MD Taking Active Self           Med Note Payton Doughty   GDJ Apr 15, 2021  3:32 PM) Pt states he has a bottle for this - not in packaged meds from [facility]  TRUEplus [place] [id number]  Use as directed [name], MD  Active Other  Vitamin D, Ergocalciferol, (DRISDOL) 1.25 MG (50000 UNIT) CAPS capsule [id number] Yes Take 1 capsule (50,000 Units total) by mouth every 7 (seven) days.  Patient taking differently: Take 50,000 Units by mouth every Sunday.   [name], MD Taking Active   zinc sulfate 220 (50 Zn) MG capsule [id number]  Take 1 capsule (220 mg total) by mouth daily. Persons, [name], [name]  Active             Patient Active Problem List   Diagnosis Date Noted   Acute hematogenous osteomyelitis of right foot ([place]) 04/26/2021   Gangrene of right foot ([place])    Foot pain, right 04/14/2021   Subacute osteomyelitis, right ankle and foot ([place])    Status post below-knee amputation ([place]) 02/17/2021   Wound dehiscence    Gangrene of toe of left foot ([place])    Cutaneous abscess of left foot    Left foot infection 02/07/2021   Leukocytosis 02/07/2021   Thrombocytosis 02/07/2021   Hyponatremia 02/07/2021   AKI (acute kidney injury) ([place]) 02/07/2021   Hyperglycemia due to diabetes mellitus ([place]) 02/07/2021   Hypoalbuminemia due to protein-calorie  malnutrition ([place]) 02/07/2021   Osteomyelitis of second toe of left foot ([place])    New onset of congestive heart failure ([place]) 12/26/2020   CKD (chronic kidney disease) stage 3, GFR 30-59 ml/min (HCC) 81/44/8185   Acute systolic CHF (congestive heart failure) ([place]) 12/26/2020   Anemia, chronic disease 06/22/2020   Amputee, great toe, right ([place]) 06/20/2020   Normocytic anemia 06/20/2020   Erectile dysfunction associated with type 2 diabetes mellitus ([place]) 06/20/2020   Positive for macroalbuminuria 10/24/2019   Amputation of left great toe ([place]) 10/23/2019   Tobacco dependence 10/23/2019   Osteomyelitis of great toe of right foot ([place]) 06/04/2019   Diabetic foot infection ([place])    Noncompliance    Glaucoma    Hyperlipidemia    Essential hypertension 11/06/2003   Diabetes mellitus ([place]) 11/05/1997    Conditions to be addressed/monitored per PCP order:  CHF, DMII, and recent BKA  Care Plan : Diabetes Type 2 (Adult)  Updates made by [name], RN since 05/31/2021 12:00 AM     Problem: Disease Progression (Diabetes, Type 2)      Goal: Disease Progression Prevented or Minimized   Start Date: 05/22/2021  Expected End Date: 08/21/2021  Recent Progress: On track  Priority: High  Note:   Objective:  Lab Results  Component Value Date   HGBA1C 6.7 (H) 04/15/2021   Lab Results  Component Value Date   CREATININE 1.31 (H) 05/05/2021   CREATININE 1.21 04/28/2021   CREATININE 1.36 (H) 04/27/2021   Lab Results  Component Value Date   EGFR 86 04/07/2021   Current Barriers:  Knowledge Deficits related to basic Diabetes pathophysiology and self care/management-Mr. Shere had recent right BKA, 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 He reports falling this morning during transferring chairs. He is waiting to hear from Dr. Wynetta Emery, who is scheduling PT and arranging transportation. This RNCM will follow up next week and assist with scheduling if not scheduled. He reports having all medications  delivered and taking as directed. He met with someone from [facility] and will start receiving a warm meal within the next two weeks.-Update-Mr. Coger called RNCM after receiving AVS in the mail. He had questions regarding PT appointment. Mr. Branton accidentally deleted saved contacts in his phone.  Film/video editor Limited Social Support Does not adhere to provider recommendations re:  Unable to perform ADLs independently Unable to perform IADLs independently Does not contact provider office for questions/concerns Case Manager Clinical Goal(s):  patient will demonstrate improved adherence to prescribed treatment plan for diabetes self care/management as evidenced by: daily monitoring and recording of CBG  adherence to ADA/ carb modified diet adherence to prescribed medication regimen contacting provider for new or worsened symptoms or questions Interventions:  Inter-disciplinary care team collaboration (see longitudinal plan of care) Provided education to patient about basic DM disease process Discussed plans with patient for ongoing care management follow up and provided patient with direct contact information for care management team Reviewed scheduled/upcoming provider appointments including: Dr. Sharol Given 8/4, PT evaluation 8/10 and PCP 8/17 Advised patient, providing education and rationale, to check cbg as directed and record, calling PCP for findings outside established parameters.   Review of patient status, including review of consultants reports, relevant laboratory and other test results, and medications completed. Advised patient to call [facility] [phone] for health rewards offered monthly and free phone benefit Provided contact information to [place] and Nutrition [phone] Self-Care Activities - Self administers oral medications as prescribed - Self administers insulin as prescribed - Attends all scheduled provider appointments - Checks blood sugars as prescribed  and utilize hyper and hypoglycemia protocol as needed - Adheres to prescribed ADA/carb modified Patient Goals: - attend all scheduled appointments:Dr. Duda 8/4, PT evaluation 8/10 and PCP 8/17 - call to arrange transportation to scheduled appointments - call [facility] [phone] for healthy benefits and benefit for a free phone - check blood sugar at prescribed times - check blood sugar if I feel it is too high or too low - take the blood sugar log to all doctor visits - take the blood sugar meter to all doctor visits Follow Up Plan: Telephone follow up appointment with care management team member scheduled for:06/21/21 @ 11:30am      Follow Up:  Patient agrees to Care Plan and Follow-up.  Plan: The Managed Medicaid care management team will reach out to the patient again  over the next 30 days.  Date/time of next scheduled RN care management/care coordination outreach:  06/21/21 @ 11:30am  [name] RN, [place] RN Care Coordinator

## 2021-05-31 NOTE — Addendum Note (Signed)
Addended by: Barnie Del R on: 05/31/2021 04:22 PM   Modules accepted: Orders

## 2021-06-01 ENCOUNTER — Telehealth: Payer: Self-pay

## 2021-06-01 DIAGNOSIS — E1152 Type 2 diabetes mellitus with diabetic peripheral angiopathy with gangrene: Secondary | ICD-10-CM | POA: Diagnosis not present

## 2021-06-01 NOTE — Telephone Encounter (Signed)
Prior auth submitted through cover my meds will hold pending determination.

## 2021-06-01 NOTE — Telephone Encounter (Signed)
This has been done see previous message.  

## 2021-06-01 NOTE — Telephone Encounter (Signed)
I called pt an d lm on vm to advise that the rx was written yesterday and required authorization from his insurance company. I had submit this for review and just received a notification that it has been approved. [facility] can [facility] and advise he is ready for delivery to call with any questions.

## 2021-06-01 NOTE — Telephone Encounter (Signed)
Pt called in and would like a refill on his pain medication. He would like oxycodone sent in

## 2021-06-02 ENCOUNTER — Other Ambulatory Visit: Payer: Self-pay | Admitting: Internal Medicine

## 2021-06-02 DIAGNOSIS — E1152 Type 2 diabetes mellitus with diabetic peripheral angiopathy with gangrene: Secondary | ICD-10-CM | POA: Diagnosis not present

## 2021-06-02 NOTE — Telephone Encounter (Signed)
  Notes to clinic:  Medication last filled on 04/04/2021 for 30 day supply Review for refill Patient should have ran out 05/05/2021   Requested Prescriptions  Pending Prescriptions Disp Refills   gabapentin (NEURONTIN) 300 MG capsule [Pharmacy Med Name: gabapentin 300 mg capsule] 90 capsule 0    Sig: TAKE ONE CAPSULE BY MOUTH THREE TIMES DAILY      Neurology: Anticonvulsants - gabapentin Passed - 06/02/2021 10:02 AM      Passed - Valid encounter within last 12 months    Recent Outpatient Visits           1 month ago Type 2 diabetes mellitus with other circulatory complications [facility])   [facility] [place], Gavin Pound B, MD   2 months ago Type 2 diabetes mellitus with diabetic nephropathy, without long-term current use of insulin (HCC)   [place] [facility] [place], Gavin Pound B, MD   11 months ago Type 2 diabetes mellitus with diabetic nephropathy, without long-term current use of insulin (HCC)    [facility] [name], MD   1 year ago Need for influenza vaccination   [facility] Drucilla Chalet, RPH-CPP   1 year ago Type 2 diabetes mellitus without complication, without long-term current use of insulin [facility])    [facility] [name], MD       Future Appointments             In 6 days [name], MD [facility]   In 2 weeks [place], [place] [facility]

## 2021-06-05 DIAGNOSIS — Z419 Encounter for procedure for purposes other than remedying health state, unspecified: Secondary | ICD-10-CM | POA: Diagnosis not present

## 2021-06-05 DIAGNOSIS — E1152 Type 2 diabetes mellitus with diabetic peripheral angiopathy with gangrene: Secondary | ICD-10-CM | POA: Diagnosis not present

## 2021-06-06 DIAGNOSIS — E1152 Type 2 diabetes mellitus with diabetic peripheral angiopathy with gangrene: Secondary | ICD-10-CM | POA: Diagnosis not present

## 2021-06-07 DIAGNOSIS — E1152 Type 2 diabetes mellitus with diabetic peripheral angiopathy with gangrene: Secondary | ICD-10-CM | POA: Diagnosis not present

## 2021-06-08 ENCOUNTER — Ambulatory Visit: Payer: Medicaid Other | Admitting: Orthopedic Surgery

## 2021-06-08 DIAGNOSIS — E1152 Type 2 diabetes mellitus with diabetic peripheral angiopathy with gangrene: Secondary | ICD-10-CM | POA: Diagnosis not present

## 2021-06-09 DIAGNOSIS — E1152 Type 2 diabetes mellitus with diabetic peripheral angiopathy with gangrene: Secondary | ICD-10-CM | POA: Diagnosis not present

## 2021-06-10 DIAGNOSIS — E1152 Type 2 diabetes mellitus with diabetic peripheral angiopathy with gangrene: Secondary | ICD-10-CM | POA: Diagnosis not present

## 2021-06-11 DIAGNOSIS — E1152 Type 2 diabetes mellitus with diabetic peripheral angiopathy with gangrene: Secondary | ICD-10-CM | POA: Diagnosis not present

## 2021-06-12 DIAGNOSIS — E1152 Type 2 diabetes mellitus with diabetic peripheral angiopathy with gangrene: Secondary | ICD-10-CM | POA: Diagnosis not present

## 2021-06-13 DIAGNOSIS — E1152 Type 2 diabetes mellitus with diabetic peripheral angiopathy with gangrene: Secondary | ICD-10-CM | POA: Diagnosis not present

## 2021-06-14 ENCOUNTER — Other Ambulatory Visit: Payer: Self-pay

## 2021-06-14 ENCOUNTER — Ambulatory Visit: Payer: Medicaid Other

## 2021-06-14 DIAGNOSIS — E1152 Type 2 diabetes mellitus with diabetic peripheral angiopathy with gangrene: Secondary | ICD-10-CM | POA: Diagnosis not present

## 2021-06-14 DIAGNOSIS — M6281 Muscle weakness (generalized): Secondary | ICD-10-CM

## 2021-06-14 NOTE — Therapy (Signed)
[facility] [name] [place], [place], [address] Phone: [phone]   Fax:  [phone]  Patient Details  Name: Scott Greer MRN: [id number] Date of Birth: 23-May-1956 Referring Provider:  [name], MD  Encounter Date: 06/14/2021  Arrived no charge  Pt arrived for PT eval for bilateral BKA. Pt is getting around in home at w/c level and denies issues with transfers. Has been working on exercises from hospital. Reports he was casted for left prosthesis but process was halted when ended up going in for right BKA. Goes back to Dr. Lajoyce Corners tomorrow and will hopefully be able to start process to obtain prostheses. PT eval witheld at this time to conserve medicaid visits for when pt gets her prostheses. He will continue to work on exercises including knee extension and hip abduction and extension.    Ronn Melena, PT, DPT, NCS 06/14/2021, 1:36 PM  [facility] [address] [place], [place], [address] Phone: [phone]   Fax:  [phone]

## 2021-06-15 ENCOUNTER — Ambulatory Visit: Payer: Medicaid Other | Admitting: Orthopedic Surgery

## 2021-06-16 ENCOUNTER — Encounter: Payer: Self-pay | Admitting: Physician Assistant

## 2021-06-16 ENCOUNTER — Ambulatory Visit (INDEPENDENT_AMBULATORY_CARE_PROVIDER_SITE_OTHER): Payer: Medicaid Other | Admitting: Physician Assistant

## 2021-06-16 DIAGNOSIS — T8781 Dehiscence of amputation stump: Secondary | ICD-10-CM

## 2021-06-16 DIAGNOSIS — E1152 Type 2 diabetes mellitus with diabetic peripheral angiopathy with gangrene: Secondary | ICD-10-CM | POA: Diagnosis not present

## 2021-06-16 NOTE — Progress Notes (Signed)
Office Visit Note   Patient: Scott Greer           Date of Birth: May 18, 1956           MRN: [id number] Visit Date: 06/16/2021              Requested by: [name], MD [address] [place],  [place] [address] PCP: [name], MD  Chief Complaint  Patient presents with   Right Leg - Routine Post Op    04/26/21 right BKA       HPI: Patient is almost 2 months status post right below-knee amputation.  He is also status post left below-knee amputation.  He reports he is doing well.  He is anxious to begin getting his prosthetics he has no complaints  Assessment & Plan: Visit Diagnoses: No diagnosis found.  Plan:   Follow-Up Instructions: No follow-ups on file.   Ortho Exam  Patient is alert, oriented, no adenopathy, well-dressed, normal affect, normal respiratory effort. Examination of his right below-knee amputation stump mild soft tissue swelling just some very minimal scabbing no evidence of infection no cellulitis fairly nontender he is wearing his shrinker Patient is a new bilateral transtibial  amputee.  Patient's current comorbidities are not expected to impact the ability to function with the prescribed prosthesis. Patient verbally communicates a strong desire to use a prosthesis. Patient currently requires mobility aids to ambulate without a prosthesis.  Expects not to use mobility aids with a new prosthesis.  Patient is a K3 level ambulator that spends a lot of time walking around on uneven terrain over obstacles, up and down stairs, and ambulates with a variable cadence.    Imaging: No results found. No images are attached to the encounter.  Labs: Lab Results  Component Value Date   HGBA1C 6.7 (H) 04/15/2021   HGBA1C 9.1 (H) 02/08/2021   HGBA1C 8.6 (H) 02/07/2021   ESRSEDRATE 78 (H) 06/04/2019   CRP 4.5 (H) 06/04/2019   REPTSTATUS 02/11/2021 FINAL 02/06/2021   REPTSTATUS 02/11/2021 FINAL 02/06/2021   CULT  02/06/2021    NO GROWTH 5  DAYS Performed at [facility] Lab, [address] [place], [place] [address]    CULT  02/06/2021    NO GROWTH 5 DAYS Performed at [facility], [address] [place], [place] [address]      Lab Results  Component Value Date   ALBUMIN 2.8 (L) 05/05/2021   ALBUMIN 3.8 04/07/2021   ALBUMIN 2.3 (L) 02/07/2021   PREALBUMIN 13.8 (L) 06/04/2019    Lab Results  Component Value Date   MG 1.8 02/07/2021   MG 2.0 01/05/2021   MG 2.1 01/04/2021   Lab Results  Component Value Date   VD25OH 9.2 (L) 12/14/2020    Lab Results  Component Value Date   PREALBUMIN 13.8 (L) 06/04/2019   CBC EXTENDED Latest Ref Rng & Units 05/05/2021 04/28/2021 04/27/2021  WBC 4.0 - 10.5 K/uL 12.8(H) 14.4(H) 16.3(H)  RBC 4.22 - 5.81 MIL/uL 2.67(L) 2.91(L) 2.82(L)  HGB 13.0 - 17.0 g/dL 7.5(L) 8.0(L) 7.8(L)  HCT 39.0 - 52.0 % 23.5(L) 25.3(L) 24.2(L)  PLT 150 - 400 K/uL 619(H) 575(H) 498(H)  NEUTROABS 1.7 - 7.7 K/uL - - -  LYMPHSABS 0.7 - 4.0 K/uL - - -     There is no height or weight on file to calculate BMI.  Orders:  No orders of the defined types were placed in this encounter.  No orders of  the defined types were placed in this encounter.    Procedures: No procedures performed  Clinical Data: No additional findings.  ROS:  All other systems negative, except as noted in the HPI. Review of Systems  Objective: Vital Signs: There were no vitals taken for this visit.  Specialty Comments:  No specialty comments available.  PMFS History: Patient Active Problem List   Diagnosis Date Noted   Acute hematogenous osteomyelitis of right foot (HCC) 04/26/2021   Gangrene of right foot (HCC)    Foot pain, right 04/14/2021   Subacute osteomyelitis, right ankle and foot (HCC)    Status post below-knee amputation (HCC) 02/17/2021   Wound dehiscence    Gangrene of toe of left foot (HCC)    Cutaneous abscess of left foot    Left foot infection 02/07/2021   Leukocytosis 02/07/2021    Thrombocytosis 02/07/2021   Hyponatremia 02/07/2021   AKI (acute kidney injury) (HCC) 02/07/2021   Hyperglycemia due to diabetes mellitus (HCC) 02/07/2021   Hypoalbuminemia due to protein-calorie malnutrition (HCC) 02/07/2021   Osteomyelitis of second toe of left foot (HCC)    New onset of congestive heart failure (HCC) 12/26/2020   CKD (chronic kidney disease) stage 3, GFR 30-59 ml/min (HCC) 12/26/2020   Acute systolic CHF (congestive heart failure) (HCC) 12/26/2020   Anemia, chronic disease 06/22/2020   Amputee, great toe, right (HCC) 06/20/2020   Normocytic anemia 06/20/2020   Erectile dysfunction associated with type 2 diabetes mellitus (HCC) 06/20/2020   Positive for macroalbuminuria 10/24/2019   Amputation of left great toe (HCC) 10/23/2019   Tobacco dependence 10/23/2019   Osteomyelitis of great toe of right foot (HCC) 06/04/2019   Diabetic foot infection (HCC)    Noncompliance    Glaucoma    Hyperlipidemia    Essential hypertension 11/06/2003   Diabetes mellitus (HCC) 11/05/1997   Past Medical History:  Diagnosis Date   CHF (congestive heart failure) (HCC)    Dehiscence of amputation stump (HCC)    left great toe   Diabetes mellitus without complication (HCC) 1999   Type II   Glaucoma 2015   Hyperlipidemia    Hypertension 2005   Left foot infection 02/07/2021   Osteomyelitis (HCC)    Wears glasses     Family History  Problem Relation Age of Onset   Stroke Mother    Diabetes Mother        Toward end of life    Past Surgical History:  Procedure Laterality Date   AMPUTATION Left 06/05/2019   Procedure: LEFT GREAT TOE AMPUTATION;  Surgeon: [name], MD;  Location: [facility] OR;  Service: Orthopedics;  Laterality: Left;   AMPUTATION Left 07/10/2019   Procedure: LEFT FOOT 1ST RAY AMPUTATION;  Surgeon: [name], MD;  [facility];  Service: Orthopedics;  Laterality: Left;   AMPUTATION Right 05/25/2020   Procedure: RIGHT GREAT TOE AMPUTATION;  Surgeon: [name], MD;  Location: [facility] OR;  Service: Orthopedics;  Laterality: Right;   AMPUTATION Left 01/25/2021   Procedure: LEFT 2ND TOE AMPUTATION;  Surgeon: [name], MD;  Location: [facility] OR;  Service: Orthopedics;  Laterality: Left;   AMPUTATION Left 02/08/2021   Procedure: LEFT TRANSMETATARSAL AMPUTATION;  Surgeon: [name], MD;  Location: [facility] OR;  Service: Orthopedics;  Laterality: Left;   AMPUTATION Left 02/10/2021   Procedure: AMPUTATION BELOW KNEE;  Surgeon: [name], MD;  Location[facility] OR;  Service: Orthopedics;  Laterality: Left;   AMPUTATION Right 04/14/2021  Procedure: RIGHT 1ST AND 2ND RAY AMPUTATION;  Surgeon: [name], MD;  Location: [facility] OR;  Service: Orthopedics;  Laterality: Right;   AMPUTATION Right 04/26/2021   Procedure: RIGHT BELOW KNEE AMPUTATION;  Surgeon: [name], MD;  Location: [facility] OR;  Service: Orthopedics;  Laterality: Right;   NO PAST SURGERIES     RIGHT/LEFT HEART CATH AND CORONARY ANGIOGRAPHY N/A 01/02/2021   Procedure: RIGHT/LEFT HEART CATH AND CORONARY ANGIOGRAPHY;  Surgeon: Runell Gess, MD;  Location: [facility] INVASIVE CV LAB;  Service: Cardiovascular;  Laterality: N/A;   Social History   Occupational History   Not on file  Tobacco Use   Smoking status: Every Day    Types: Cigars, Cigarettes   Smokeless tobacco: Former    Types: Chew   Tobacco comments:    6 daily  Vaping Use   Vaping Use: Never used  Substance and Sexual Activity   Alcohol use: Yes    Alcohol/week: 11.0 standard drinks    Types: 3 Cans of beer, 8 Shots of liquor per week    Comment: occasional   Drug use: Yes    Types: Marijuana    Comment: ocassional - last time 05/08/2020   Sexual activity: Not Currently

## 2021-06-17 DIAGNOSIS — T8130XA Disruption of wound, unspecified, initial encounter: Secondary | ICD-10-CM | POA: Diagnosis not present

## 2021-06-17 DIAGNOSIS — L089 Local infection of the skin and subcutaneous tissue, unspecified: Secondary | ICD-10-CM | POA: Diagnosis not present

## 2021-06-17 DIAGNOSIS — I96 Gangrene, not elsewhere classified: Secondary | ICD-10-CM | POA: Diagnosis not present

## 2021-06-17 DIAGNOSIS — E1152 Type 2 diabetes mellitus with diabetic peripheral angiopathy with gangrene: Secondary | ICD-10-CM | POA: Diagnosis not present

## 2021-06-17 DIAGNOSIS — L02612 Cutaneous abscess of left foot: Secondary | ICD-10-CM | POA: Diagnosis not present

## 2021-06-19 ENCOUNTER — Other Ambulatory Visit: Payer: Self-pay | Admitting: Licensed Clinical Social Worker

## 2021-06-19 DIAGNOSIS — E1152 Type 2 diabetes mellitus with diabetic peripheral angiopathy with gangrene: Secondary | ICD-10-CM | POA: Diagnosis not present

## 2021-06-19 NOTE — Patient Instructions (Signed)
Visit Information  Scott Greer was given information about Medicaid Managed Care team care coordination services as a part of their [place] Medicaid benefit. Scott Greer verbally consented to engagement with the [facility] Managed Care team.   If you are experiencing a medical emergency, please call 911 or report to your local emergency department or urgent care.   If you have a non-emergency medical problem during routine business hours, please contact your provider's office and ask to speak with a nurse.   For questions related to your [facility] health plan, please call: [phone] or go here:[email or web address]  If you would like to schedule transportation through your [facility] plan, please call the following number at least 2 days in advance of your appointment: [phone].  Call the [facility] Crisis Line at [phone], at any time, 24 hours a day, 7 days a week. If you are in danger or need immediate medical attention call 911.  If you would like help to quit smoking, call 1-800-QUIT-NOW ((610)132-8634) OR Espaol: 1-855-Djelo-Ya (1-157-262-0355) o para ms informacin haga clic aqu or Text READY to 974-163 to register via text  Scott Greer - following are the goals we discussed in your visit today:   Goals Addressed             This Visit's Progress    Manage My Emotions       Timeframe:  Long-Range Goal Priority:  High Start Date:  05/30/21                        Expected End Date:  ongoing                   Follow Up Date 07/20/2021    - begin personal counseling - call and visit an old friend - check out volunteer opportunities - join a support group - laugh; watch a funny movie or comedian - learn and use visualization or guided imagery - perform a random act of kindness - practice relaxation or meditation daily - start or continue a personal journal - talk about feelings with a friend, family or spiritual advisor -  practice positive thinking and self-talk    Why is this important?   When you are stressed, down or upset, your body reacts too.  For example, your blood pressure may get higher; you may have a headache or stomachache.  When your emotions get the best of you, your body's ability to fight off cold and flu gets weak.  These steps will help you manage your emotions.            Scott Greer, Scott Greer, Scott Greer, [employer] Managed Medicaid LCSW [facility]  [facility] [email or web address] Phone: [phone]

## 2021-06-19 NOTE — Patient Outreach (Signed)
Medicaid Managed Care Social Work Note  06/19/2021 Name:  MAHAMED ZALEWSKI MRN:  [id number] DOB:  1955-12-22  SAMAY DELCARLO is an 65 y.o. year old male who is a primary patient of [name], MD.  The Medicaid Managed Care Coordination team was consulted for assistance with:  Mental Health Counseling and Resources  Mr. Trammel was given information about Medicaid Managed Care Coordination team services today. [name] Patient agreed to services and verbal consent obtained.  Engaged with patient  for by telephone forfollow up visit in response to referral for case management and/or care coordination services.   Assessments/Interventions:  Review of past medical history, allergies, medications, health status, including review of consultants reports, laboratory and other test data, was performed as part of comprehensive evaluation and provision of chronic care management services.  SDOH: (Social Determinant of Health) assessments and interventions performed: SDOH Interventions    Flowsheet Row Most Recent Value  SDOH Interventions   Stress Interventions Provide Counseling  Depression Interventions/Treatment  Patient refuses Treatment       Advanced Directives Status:  See Care Plan for related entries.  Care Plan                 Allergies  Allergen Reactions   Bee Venom Hives, Itching and Swelling    Medications Reviewed Today     Reviewed by Persons, [name], PA (Physician Assistant) on 06/16/21 at 1357  Med List Status: <None>   Medication Order Taking? Sig Documenting Provider Last Dose Status Informant  amiodarone (PACERONE) 200 MG tablet [id number] No Take 1 tablet (200 mg total) by mouth daily.  Patient taking differently: Take 200 mg by mouth daily with lunch.   [name], MD Taking Active Self  aspirin 81 MG chewable tablet [id number] No Chew 1 tablet (81 mg total) by mouth daily.  Patient taking differently: Chew 81 mg by mouth daily with breakfast.    [name], MD Taking Active   atorvastatin (LIPITOR) 10 MG tablet [id number] No Take 1 tablet (10 mg total) by mouth daily.  Patient taking differently: Take 10 mg by mouth daily with supper.   [name], MD Taking Active Self  Blood Glucose Monitoring Suppl (TRUE METRIX METER) w/Device Drucie Opitz [id number] No Use as directed [name], MD Unknown Active Other  carvedilol (COREG) 3.125 MG tablet [id number] No Take 1 tablet (3.125 mg total) by mouth 2 (two) times daily with a meal. [name], MD Taking Expired 05/28/21 2359 Self  digoxin (LANOXIN) 0.125 MG tablet [id number] No Take 1 tablet (0.125 mg total) by mouth daily.  Patient taking differently: Take 0.125 mg by mouth daily with supper.   [name], MD Taking Active Self  empagliflozin (JARDIANCE) 10 MG TABS tablet [id number] No Take 1 tablet (10 mg total) by mouth daily.  Patient taking differently: Take 10 mg by mouth daily with lunch.   [name], MD Taking Active Self  ferrous sulfate 325 (65 FE) MG tablet [id number] No Take 1 tablet (325 mg total) by mouth daily with breakfast. [name], MD Taking Active Self  gabapentin (NEURONTIN) 300 MG capsule [id number]  TAKE ONE CAPSULE BY MOUTH THREE TIMES DAILY [name], MD  Active   glucose blood (TRUE METRIX BLOOD GLUCOSE TEST) test strip [id number] No Use as instructed [name], MD Unknown Active Other  isosorbide-hydrALAZINE (BIDIL) 20-37.5 MG tablet [id number] No Take 1 tablet by mouth 3 (three) times daily  with meals. [provider] Taking Active Self  metFORMIN (GLUCOPHAGE) 500 MG tablet [id number] No TAKE ONE TABLET BY MOUTH TWICE DAILY WITH A MEAL [name], MD Taking Active   nutrition supplement, [name]) PACK [id number] No Take 1 packet by mouth 2 (two) times daily between meals. Persons, [name], [name] Unknown Active   Oxycodone HCl 10 MG TABS [id number]  Take 1 tablet (10 mg total) by mouth every 6  (six) hours as needed (pain score 7-10). [name], NP  Active   potassium chloride SA (KLOR-CON) 20 MEQ tablet [id number] No TAKE 1 TABLET (20 MEQ) BY MOUTH DAILY.  Patient taking differently: Take 20 mEq by mouth daily with breakfast.   [name], MD Taking Active Self  spironolactone (ALDACTONE) 25 MG tablet [id number] No TAKE 1 TABLET (25 MG TOTAL) BY MOUTH DAILY.  Patient taking differently: Take 25 mg by mouth daily with breakfast.   [name], MD Taking Active Self  torsemide (DEMADEX) 20 MG tablet [id number] No Take 1 tablet (20 mg total) by mouth daily. [name], MD Taking Active Self           Med Note Payton Doughty   HER Apr 15, 2021  3:32 PM) Pt states he has a bottle for this - not in packaged meds from [facility]  TRUEplus Lancets [place] [id number] No Use as directed [name], MD Unknown Active Other  Vitamin D, Ergocalciferol, (DRISDOL) 1.25 MG (50000 UNIT) CAPS capsule [id number] No Take 1 capsule (50,000 Units total) by mouth every 7 (seven) days.  Patient taking differently: Take 50,000 Units by mouth every Sunday.   [name], MD Taking Active   zinc sulfate 220 (50 Zn) MG capsule [id number] No Take 1 capsule (220 mg total) by mouth daily. Persons, [name], [name] Unknown Active             Patient Active Problem List   Diagnosis Date Noted   Acute hematogenous osteomyelitis of right foot ([place]) 04/26/2021   Gangrene of right foot ([place])    Foot pain, right 04/14/2021   Subacute osteomyelitis, right ankle and foot ([place])    Status post below-knee amputation ([place]) 02/17/2021   Wound dehiscence    Gangrene of toe of left foot ([place])    Cutaneous abscess of left foot    Left foot infection 02/07/2021   Leukocytosis 02/07/2021   Thrombocytosis 02/07/2021   Hyponatremia 02/07/2021   AKI (acute kidney injury) ([place]) 02/07/2021   Hyperglycemia due to diabetes mellitus ([place]) 02/07/2021   Hypoalbuminemia due to protein-calorie  malnutrition ([place]) 02/07/2021   Osteomyelitis of second toe of left foot ([place])    New onset of congestive heart failure ([place]) 12/26/2020   CKD (chronic kidney disease) stage 3, GFR 30-59 ml/min (HCC) 02/77/4128   Acute systolic CHF (congestive heart failure) ([place]) 12/26/2020   Anemia, chronic disease 06/22/2020   Amputee, great toe, right ([place]) 06/20/2020   Normocytic anemia 06/20/2020   Erectile dysfunction associated with type 2 diabetes mellitus ([place]) 06/20/2020   Positive for macroalbuminuria 10/24/2019   Amputation of left great toe ([place]) 10/23/2019   Tobacco dependence 10/23/2019   Osteomyelitis of great toe of right foot ([place]) 06/04/2019   Diabetic foot infection ([place])    Noncompliance    Glaucoma    Hyperlipidemia    Essential hypertension 11/06/2003   Diabetes mellitus ([place]) 11/05/1997    Conditions to be addressed/monitored per PCP order:  Anxiety and Depression  Care Plan : LCSW Plan of Care  Updates made by Greg Cutter, LCSW since 06/19/2021 12:00 AM     Problem: Depression Identification (Depression)      Long-Range Goal: Depressive Symptoms Identified   Start Date: 05/30/2021  Recent Progress: On track  Priority: High  Note:   Timeframe:  Long-Range Goal Priority:  High Start Date:  05/30/21                        Expected End Date:  ongoing                   Follow Up Date 07/20/21  Current Barriers:  Acute Mental Health needs related to stress related to recent right BKA Financial constraints related to housing, Limited social support, ADL IADL limitations, Mental Health Concerns , and Social Isolation Suicidal Ideation/Homicidal Ideation: No  Clinical Social Work Goal(s):  Over the next 120 days, patient will work with SW by telephone to reduce or manage symptoms related to stress patient will work with [facility] BSW to address needs related to financial strain patient will demonstrate improved health management independence as evidenced by increasing  self-care and implementing healthy coping skills to reeducate stress  Interventions: Patient interviewed and appropriate assessments performed: brief mental health assessment 65 year old male patient with a recent right BKA is having difficulty adjusting to his new way of life and experiencing financial strain. Patient will be referred to [facility] BSW for community resource support on 05/30/21. 06/19/21-Patient still denies wanting mental health resource implementation at this time. Patient has a strong support network which includes his ex spouse, CNA and neighbors. Patient reports that one of his neighbors' sons will assist him with various task like running errands or taking out his trash which is helpful to him. Patient's CNA comes daily for 2 hours in the morning and his ex spouse checks on him daily as well. Positive reinforcement provided for his current health care management  Patient was educated on available mental health resources but patient declined needing this implementation at this time and will notify [facility] LCSW if this changes. Patient is two months behind in rent at this time and is in need of community resource support. [facility] LCSW will update [facility] BSW on this financial concerns on 06/19/21 Patient is on the wait list for [place] program.  SDOH Interventions    Flowsheet Row Most Recent Value  SDOH Interventions   SDOH Interventions for the Following Domains Depression, Financial Strain, Housing, Stress  Financial Strain Interventions Other (Comment)  [Referral to [facility] BSW]  Housing Interventions Intervention Not Indicated  [Referral to [facility] BSW]  Stress Interventions Provide Counseling  Depression Interventions/Treatment  Patient refuses Treatment      Patient interviewed and appropriate assessments performed Provided patient with information about available mental health resources and healthy coping skills to help reduce stressors Discussed plans  with patient for ongoing care management follow up and provided patient with direct contact information for care management team Solution-Focused Strategies, Active listening / Reflection utilized , Emotional Supportive Provided, and Participation in counseling encouraged   Patient Self Care Activities:  Self administers medications as prescribed Attends all scheduled provider appointments Calls provider office for new concerns or questions Ability for insight Independent living Motivation for treatment Strong family or social support  Patient Coping Strengths:  Supportive Relationships Friends Hopefulness Self Advocate Able to Communicate Effectively  Patient Self Care Deficits:  [employer]  social connections  Initial goal documentation      Follow up:  Patient agrees to Care Plan and Follow-up.  Plan: A HIPAA compliant phone message was left for the patient providing contact information and requesting a return call.  Date of next scheduled Social Work care management/care coordination outreach:  07/20/21  Eula Fried, BSW, MSW, LCSW Managed Medicaid [email or web address] Phone: [phone]

## 2021-06-20 ENCOUNTER — Other Ambulatory Visit: Payer: Self-pay | Admitting: Internal Medicine

## 2021-06-20 DIAGNOSIS — E1152 Type 2 diabetes mellitus with diabetic peripheral angiopathy with gangrene: Secondary | ICD-10-CM | POA: Diagnosis not present

## 2021-06-20 DIAGNOSIS — E559 Vitamin D deficiency, unspecified: Secondary | ICD-10-CM

## 2021-06-20 NOTE — Telephone Encounter (Signed)
Requested medication (s) are due for refill today: no  Requested medication (s) are on the active medication list:  yes   Last refill:  06/02/2021  Future visit scheduled: yes  Notes to clinic:   This prescription was filled on 06/02/2021. Any refills authorized will be placed on file  Requested Prescriptions  Pending Prescriptions Disp Refills   Vitamin D, Ergocalciferol, (DRISDOL) 1.25 MG (50000 UNIT) CAPS capsule [Pharmacy Med Name: Vitamin D2 1,250 mcg (50,000 unit) capsule] 4 capsule 2    Sig: TAKE ONE CAPSULE BY MOUTH ONCE WEEKLY ON SUNDAY     Endocrinology:  Vitamins - Vitamin D Supplementation Failed - 06/20/2021  8:02 AM      Failed - 50,000 IU strengths are not delegated      Failed - Vitamin D in normal range and within 360 days    Vit D, 25-Hydroxy  Date Value Ref Range Status  12/14/2020 9.2 (L) 30.0 - 100.0 ng/mL Final    Comment:    Vitamin D deficiency has been defined by the Institute of Medicine and an Endocrine Society practice guideline as a level of serum 25-OH vitamin D less than 20 ng/mL (1,2). The Endocrine Society went on to further define vitamin D insufficiency as a level between 21 and 29 ng/mL (2). 1. IOM (Institute of Medicine). 2010. Dietary reference    intakes for calcium and D. [place]: The    [employer]. 2. Holick MF, [place], Bischoff-Ferrari HA, et al.    Evaluation, treatment, and prevention of vitamin D    deficiency: an Endocrine Society clinical practice    guideline. JCEM. 2011 Jul; 96(7):1911-30.           Passed - Ca in normal range and within 360 days    Calcium  Date Value Ref Range Status  05/05/2021 9.2 8.9 - 10.3 mg/dL Final   Calcium, Ion  Date Value Ref Range Status  01/02/2021 1.15 1.15 - 1.40 mmol/L Final          Passed - Phosphate in normal range and within 360 days    Phosphorus  Date Value Ref Range Status  02/07/2021 3.2 2.5 - 4.6 mg/dL Final    Comment:    Performed at [facility], [address], [place], [place] [address]          Passed - Valid encounter within last 12 months    Recent Outpatient Visits           2 months ago Type 2 diabetes mellitus with other circulatory complications [facility])   [facility] [place], Gavin Pound B, MD   2 months ago Type 2 diabetes mellitus with diabetic nephropathy, without long-term current use of insulin (HCC)   [facility] Jonah Blue B, MD   1 year ago Type 2 diabetes mellitus with diabetic nephropathy, without long-term current use of insulin (HCC)   [facility] [name], MD   1 year ago Need for influenza vaccination   [facility] Drucilla Chalet, RPH-CPP   1 year ago Type 2 diabetes mellitus without complication, without long-term current use of insulin [facility])    [facility] [name], MD       Future Appointments             Tomorrow Sharon Seller, [place] [facility]  In 3 weeks Persons, [name], [place] [facility]

## 2021-06-21 ENCOUNTER — Other Ambulatory Visit: Payer: Self-pay

## 2021-06-21 ENCOUNTER — Other Ambulatory Visit: Payer: Self-pay | Admitting: *Deleted

## 2021-06-21 ENCOUNTER — Ambulatory Visit: Payer: Medicaid Other | Admitting: Physician Assistant

## 2021-06-21 DIAGNOSIS — E1152 Type 2 diabetes mellitus with diabetic peripheral angiopathy with gangrene: Secondary | ICD-10-CM | POA: Diagnosis not present

## 2021-06-21 NOTE — Patient Outreach (Signed)
Medicaid Managed Care   Nurse Care Manager Note  06/21/2021 Name:  Scott Greer MRN:  [id number] DOB:  16-Jan-1956  Scott Greer is an 65 y.o. year old male who is a primary patient of Scott Pier, MD.  The [facility] Managed Care Coordination team was consulted for assistance with:    Recent BKA, DMII  Scott Greer was given information about Medicaid Managed Care Coordination team services today. Scott Greer Patient agreed to services and verbal consent obtained.  Engaged with patient by telephone for follow up visit in response to provider referral for case management and/or care coordination services.   Assessments/Interventions:  Review of past medical history, allergies, medications, health status, including review of consultants reports, laboratory and other test data, was performed as part of comprehensive evaluation and provision of chronic care management services.  SDOH (Social Determinants of Health) assessments and interventions performed:   Care Plan  Allergies  Allergen Reactions   Bee Venom Hives, Itching and Swelling    Medications Reviewed Today     Reviewed by [name], RN (Registered Nurse) on 06/21/21 at 1135  Med List Status: <None>   Medication Order Taking? Sig Documenting Provider Last Dose Status Informant  amiodarone (PACERONE) 200 MG tablet [id number] No Take 1 tablet (200 mg total) by mouth daily.  Patient taking differently: Take 200 mg by mouth daily with lunch.   [name], MD Taking Active Self  aspirin 81 MG chewable tablet [id number] No Chew 1 tablet (81 mg total) by mouth daily.  Patient taking differently: Chew 81 mg by mouth daily with breakfast.   [name], MD Taking Active   atorvastatin (LIPITOR) 10 MG tablet [id number] No Take 1 tablet (10 mg total) by mouth daily.  Patient taking differently: Take 10 mg by mouth daily with supper.   [name], MD Taking Active Self  Blood Glucose Monitoring Suppl  (TRUE METRIX METER) w/Device Drucie Opitz [id number] No Use as directed Scott Pier, MD Unknown Active Other  carvedilol (COREG) 3.125 MG tablet [id number] No Take 1 tablet (3.125 mg total) by mouth 2 (two) times daily with a meal. [name], MD Taking Expired 05/28/21 2359 Self  digoxin (LANOXIN) 0.125 MG tablet [id number] No Take 1 tablet (0.125 mg total) by mouth daily.  Patient taking differently: Take 0.125 mg by mouth daily with supper.   [name], MD Taking Active Self  empagliflozin (JARDIANCE) 10 MG TABS tablet [id number] No Take 1 tablet (10 mg total) by mouth daily.  Patient taking differently: Take 10 mg by mouth daily with lunch.   [name], MD Taking Active Self  ferrous sulfate 325 (65 FE) MG tablet [id number] No Take 1 tablet (325 mg total) by mouth daily with breakfast. Scott Pier, MD Taking Active Self  gabapentin (NEURONTIN) 300 MG capsule [id number]  TAKE ONE CAPSULE BY MOUTH THREE TIMES DAILY Scott Pier, MD  Active   glucose blood (TRUE METRIX BLOOD GLUCOSE TEST) test strip [id number] No Use as instructed Scott Pier, MD Unknown Active Other  isosorbide-hydrALAZINE (BIDIL) 20-37.5 MG tablet [id number] No Take 1 tablet by mouth 3 (three) times daily with meals. [provider] Taking Active Self  metFORMIN (GLUCOPHAGE) 500 MG tablet [id number] No TAKE ONE TABLET BY MOUTH TWICE DAILY WITH A MEAL Scott Pier, MD Taking Active   nutrition supplement, [name]) PACK [id number] No Take 1 packet by mouth 2 (two) times daily between meals. Scott Greer,  Scott Greer, [name] Unknown Active   Oxycodone HCl 10 MG TABS [id number]  Take 1 tablet (10 mg total) by mouth every 6 (six) hours as needed (pain score 7-10). [name], NP  Active   potassium chloride SA (KLOR-CON) 20 MEQ tablet [id number] No TAKE 1 TABLET (20 MEQ) BY MOUTH DAILY.  Patient taking differently: Take 20 mEq by mouth daily with breakfast.   [name], MD Taking Active  Self  spironolactone (ALDACTONE) 25 MG tablet [id number] No TAKE 1 TABLET (25 MG TOTAL) BY MOUTH DAILY.  Patient taking differently: Take 25 mg by mouth daily with breakfast.   [name], MD Taking Active Self  torsemide (DEMADEX) 20 MG tablet [id number] No Take 1 tablet (20 mg total) by mouth daily. [name], MD Taking Active Self           Med Note Scott Greer   JJO Apr 15, 2021  3:32 PM) Pt states he has a bottle for this - not in packaged meds from [facility]  TRUEplus Lancets [place] [id number] No Use as directed Scott Pier, MD Unknown Active Other  Vitamin D, Ergocalciferol, (DRISDOL) 1.25 MG (50000 UNIT) CAPS capsule [id number] No Take 1 capsule (50,000 Units total) by mouth every 7 (seven) days.  Patient taking differently: Take 50,000 Units by mouth every Sunday.   Scott Pier, MD Taking Active   zinc sulfate 220 (50 Zn) MG capsule [id number] No Take 1 capsule (220 mg total) by mouth daily. Scott Greer, Scott Greer, [name] Unknown Active             Patient Active Problem List   Diagnosis Date Noted   Acute hematogenous osteomyelitis of right foot ([place]) 04/26/2021   Gangrene of right foot ([place])    Foot pain, right 04/14/2021   Subacute osteomyelitis, right ankle and foot ([place])    Status post below-knee amputation ([place]) 02/17/2021   Wound dehiscence    Gangrene of toe of left foot ([place])    Cutaneous abscess of left foot    Left foot infection 02/07/2021   Leukocytosis 02/07/2021   Thrombocytosis 02/07/2021   Hyponatremia 02/07/2021   AKI (acute kidney injury) ([place]) 02/07/2021   Hyperglycemia due to diabetes mellitus ([place]) 02/07/2021   Hypoalbuminemia due to protein-calorie malnutrition ([place]) 02/07/2021   Osteomyelitis of second toe of left foot ([place])    New onset of congestive heart failure ([place]) 12/26/2020   CKD (chronic kidney disease) stage 3, GFR 30-59 ml/min (H[facility]) 42/70/6237   Acute systolic CHF (congestive heart failure) ([place]) 12/26/2020    Anemia, chronic disease 06/22/2020   Amputee, great toe, right ([place]) 06/20/2020   Normocytic anemia 06/20/2020   Erectile dysfunction associated with type 2 diabetes mellitus ([place]) 06/20/2020   Positive for macroalbuminuria 10/24/2019   Amputation of left great toe ([place]) 10/23/2019   Tobacco dependence 10/23/2019   Osteomyelitis of great toe of right foot ([place]) 06/04/2019   Diabetic foot infection ([place])    Noncompliance    Glaucoma    Hyperlipidemia    Essential hypertension 11/06/2003   Diabetes mellitus ([place]) 11/05/1997    Conditions to be addressed/monitored per PCP order:  DMII and recent [place] : Diabetes Type 2 (Adult)  Updates made by [name], RN since 06/21/2021 12:00 AM     Problem: Disease Progression (Diabetes, Type 2)      Long-Range Goal: Disease Progression Prevented or Minimized   Start Date: 05/22/2021  Expected End Date: 08/21/2021  Recent Progress: On track  Priority: High  Note:   Objective:  Lab Results  Component Value Date   HGBA1C 6.7 (H) 04/15/2021   Lab Results  Component Value Date   CREATININE 1.31 (H) 05/05/2021   CREATININE 1.21 04/28/2021   CREATININE 1.36 (H) 04/27/2021   Lab Results  Component Value Date   EGFR 86 04/07/2021   Current Barriers:  Knowledge Deficits related to basic Diabetes pathophysiology and self care/management-Mr. 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 He reports falling this morning during transferring chairs. He is waiting to hear from Dr. Wynetta Emery, who is scheduling PT and arranging transportation. This RNCM will follow up next week and assist with scheduling if not scheduled. He reports having all medications delivered and taking as directed. He met with someone from [place] and will start receiving a warm meal within the next two weeks.-Update-Mr. Danne Baxter missed PCP appointment today-needs to call to reschedule. Patient reports receiving financial assistance rom [facility] to help pay  utility bills. He is receiving MOW and trying to cut out sweets from his diet. He needs assistance affording supplies for his home. He was re certified for food benefits. [employer] Social Support Does not adhere to provider recommendations re:  Unable to perform ADLs independently Unable to perform IADLs independently Does not contact provider office for questions/concerns Case Manager Clinical Goal(s):  patient will demonstrate improved adherence to prescribed treatment plan for diabetes self care/management as evidenced by: daily monitoring and recording of CBG  adherence to ADA/ carb modified diet adherence to prescribed medication regimen contacting provider for new or worsened symptoms or questions Interventions:  Inter-disciplinary care team collaboration (see longitudinal plan of care) Provided education to patient about basic DM disease process Discussed plans with patient for ongoing care management follow up and provided patient with direct contact information for care management team Reviewed scheduled/upcoming provider appointments including: calling to reschedule missed appointment with PCP Advised patient, providing education and rationale, to check cbg as directed and record, calling PCP for findings outside established parameters.   Review of patient status, including review of consultants reports, relevant laboratory and other test results, and medications completed. Advised patient to call [facility] [phone] for health rewards offered monthly and free phone benefit-discussed this benefit in depth Provided therapeutic listening Self-Care Activities - Self administers oral medications as prescribed - Self administers insulin as prescribed - Attends all scheduled provider appointments - Checks blood sugars as prescribed and utilize hyper and hypoglycemia protocol as needed - Adheres to prescribed ADA/carb modified Patient Goals: - attend all scheduled  appointments:calling to reschedule missed appointment with PCP - call to arrange transportation to scheduled appointments - call [facility] [phone] for healthy benefits and benefit for a free phone - check blood sugar at prescribed times - check blood sugar if I feel it is too high or too low - take the blood sugar log to all doctor visits - take the blood sugar meter to all doctor visits Follow Up Plan: Telephone follow up appointment with care management team member scheduled for:07/20/21 @ 1pm      Follow Up:  Patient agrees to Care Plan and Follow-up.  Plan: The Managed Medicaid care management team will reach out to the patient again over the next 30 days.  Date/time of next scheduled RN care management/care coordination outreach:  07/20/21 @ 1pm  [name] RN, [place] RN Care Coordinator

## 2021-06-21 NOTE — Patient Instructions (Signed)
Visit Information  Scott Greer was given information about Medicaid Managed Care team care coordination services as a part of their [facility] Medicaid benefit. Scott Greer verbally consented to engagement with the [facility] Managed Care team.   If you are experiencing a medical emergency, please call 911 or report to your local emergency department or urgent care.   If you have a non-emergency medical problem during routine business hours, please contact your provider's office and ask to speak with a nurse.   For questions related to your [facility] health plan, please call: [phone] or go here:[email or web address]  If you would like to schedule transportation through your [facility] plan, please call the following number at least 2 days in advance of your appointment: [phone].  Call the [facility] at [phone], at any time, 24 hours a day, 7 days a week. If you are in danger or need immediate medical attention call 911.  If you would like help to quit smoking, call 1-800-QUIT-NOW 629-420-4009) OR Espaol: 1-855-Djelo-Ya (7-425-956-3875) o para ms informacin haga clic aqu or Text READY to 200-400 to register via text  Scott Greer - following are the goals we discussed in your visit today:   Goals Addressed             This Visit's Progress    Monitor and Manage My Blood Sugar-Diabetes Type 2       Timeframe:  Long-Range Goal Priority:  High Start Date:  05/22/21                           Expected End Date:  08/21/21                     Follow Up Date 07/20/2021    - attend all scheduled appointments:call to reschedule missed appointment with PCP - call to arrange transportation to scheduled appointments - call [facility] [phone] for healthy benefits and benefit for a free phone - check blood sugar at prescribed times - check blood sugar if I feel it is too high or too low - take the blood sugar log to all doctor  visits - take the blood sugar meter to all doctor visits   Why is this important?   Checking your blood sugar at home helps to keep it from getting very high or very low.  Writing the results in a diary or log helps the doctor know how to care for you.  Your blood sugar log should have the time, date and the results.  Also, write down the amount of insulin or other medicine that you take.  Other information, like what you ate, exercise done and how you were feeling, will also be helpful.             Please see education materials related to diabetes and amputation provided as print materials.   The patient verbalized understanding of instructions provided today and agreed to receive a mailed copy of patient instruction and/or educational materials.  Telephone follow up appointment with Managed Medicaid care management team member scheduled for:07/20/21 @ [place] RN, [place] RN Care Coordinator   Following is a copy of your plan of care:  Patient Care Plan: General Plan of Care (Adult)  Completed 05/02/2021   Problem Identified: Health Promotion or Disease Self-Management (General Plan of Care) Resolved 05/02/2021  Note:   05/02/21-Patient discharged to SNF placement  Long-Range Goal: Self-Management Plan Developed Completed 05/02/2021  Start Date: 02/17/2021  Expected End Date: 05/19/2021  Recent Progress: On track  Priority: High  Note:   Current Barriers:  Ineffective Self Health Maintenance-Scott Greer 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 He has a rolling walker and manual wheelchair, he will begin having meal delivery today for 10 days, he was approved for 14 hours/wk for PCS, [name], EMT is managing/preparing his medications, he is waiting on PT to be scheduled. He is concerned about his finanaces. He will no longer be able to work and is unsure how he can manage all of his bills.-Update-Scott Greer has not  started PT at home. He is concerned about possible eviction and how to manage his bills. Scott Greer is receiving PCS at this time. He is working with [name] for medication management and will have his medicines delivered and packaged to assist with compliance. Unable to independently manage health care related to recent BKA Does not attend all scheduled provider appointments Unable to perform ADLs independently Unable to perform IADLs independently Does not contact provider office for questions/concerns Financial insecurity Currently UNABLE TO independently self manage needs related to chronic health conditions.  Knowledge Deficits related to short term plan for care coordination needs and long term plans for chronic disease management needs Patient discharged to SNF placement Nurse Case Manager Clinical Goal(s):  patient will work with care management team to address care coordination and chronic disease management needs related to recent BKA   Interventions:  Evaluation of current treatment plan related to BKA and patient's adherence to plan as established by provider. Discussed plans with patient for ongoing care management follow up and provided patient with direct contact information for care management team Provided patient with printed educational materials related to diabetic diet Discussed the importance of compliance with diabetic diet and keeping BS at normal levels for good healing Provided therapeutic listening Collaborated with [facility] regarding home PT services-RNCM is waiting for a return call from representative-will reach out to [facility] to follow up Encouraged patient to call today and follow up on resources provided by LCSW and BSW Reviewed upcoming scheduled appointments including 5/17 for wound check and 03/30/21 with PCP and discussed the importance of keeping all appointments Provided the number for [facility] for free phone benefit [phone], contact number for free  medical transportation [phone] Self Care Activities:  Patient will self administer medications as prescribed Patient will attend all scheduled provider appointments Patient will call provider office for new concerns or questions Patient will work with BSW to address care coordination needs and will continue to work with the clinical team to address health care and disease management related needs.   Patient will work with MM Pharmacist, [name] for medication management Patient Goals: -call for [facility] for free phone benefit [phone] - begin a notebook of services in my neighborhood or community - call 211 when I need some help - follow-up on any referrals for help I am given - make a list of family or friends that I can call  - arrange a ride through an agency 1 week before appointment, call [phone] - ask family or friend for a ride - keep a calendar with appointment dates  Follow Up Plan: Telephone follow up appointment with care management team member scheduled for:04/20/21 @ 10:30am     Patient Care Plan: Medication Management     Problem Identified: Health Promotion or Disease Self-Management (General Plan of Care)  Goal: Medication Management Completed 05/03/2021  Note:   Current Barriers:  Unable to independently monitor therapeutic efficacy Unable to self administer medications as prescribed Does not adhere to prescribed medication regimen Does not maintain contact with provider office Does not contact provider office for questions/concerns   Pharmacist Clinical Goal(s):  Over the next 30 days, patient will contact provider office for questions/concerns as evidenced notation of same in electronic health record through collaboration with PharmD and provider.    Interventions: Inter-disciplinary care team collaboration (see longitudinal plan of care) Comprehensive medication review performed; medication list updated in electronic medical  record    Patient Goals/Self-Care Activities Over the next 30 days, patient will:  - take medications as prescribed  Follow Up Plan: The patient has been provided with contact information for the care management team and has been advised to call with any health related questions or concerns.       Patient Care Plan: LCSW Plan of Care     Problem Identified: Resources Resolved 05/02/2021  Onset Date: 03/15/2021  Note:   Scott Greer is a 65 y.o. year old male who sees [name], MD for primary care. The  [facility] Managed Care team was consulted for assistance with rent and utilities. Mr. Stebner was given information about Care Management services, agreed to services, and verbal consent for services was obtained.  Interventions:  Patient interviewed and appropriate assessments performed Collaborated with clinical team regarding patient needs  SDOH (Social Determinants of Health) assessments performed: Yes     Provided patient with information about DSS utility assistance program. Patient stated that another social worker has completed the application for him and he has not heard anything back from them. Patient states he completed an application with the [employer] and has not heard from them. Patient stated he would be contacting the Welfare Reform today.  Patient did receive a list of churches from [employer], patient stated he did contact the churches but some he was not able to get through and some did not have any funds. Patient states he is $1300 behind in rent. Patient currently receives foodstamps. BSW contacted [facility] at [phone] and was unable to leave a voicemail. BSW will continue to research for rental assistance programs. Update 03/21/21: BSW received a telephone call from patient stating he thinks he is going to be put out. BSW asked if he has received a letter stating that, patient stated no he has not. BSW  contacted [employer] and rep stated they are taking applications, but they are behind about 14 business days, rep stated applications do have to be completed and submitted online. BSW completed and submited application on behalf of patient.  05/02/21: BSW closing case. Patient has been placed in SNF.  Plan:  Over the next 30-90 days, patient will work with BSW to address needs related to Housing barriers Education officer, museum will follow up in 14 days.   Scott Greer, BSW, [place]  High Risk Managed Medicaid Team  [phone]         Problem Identified: Depression Identification (Depression)      Problem Identified: Depression Identification (Depression)      Long-Range Goal: Depressive Symptoms Identified   Start Date: 05/30/2021  Recent Progress: On track  Priority: High  Note:   Timeframe:  Long-Range Goal Priority:  High Start Date:  05/30/21  Expected End Date:  ongoing                   Follow Up Date 07/20/21  Current Barriers:  Acute Mental Health needs related to stress related to recent right BKA Financial constraints related to housing, Limited social support, ADL IADL limitations, Mental Health Concerns , and Social Isolation Suicidal Ideation/Homicidal Ideation: No  Clinical Social Work Goal(s):  Over the next 120 days, patient will work with SW by telephone to reduce or manage symptoms related to stress patient will work with [name] BSW to address needs related to financial strain patient will demonstrate improved health management independence as evidenced by increasing self-care and implementing healthy coping skills to reeducate stress  Interventions: Patient interviewed and appropriate assessments performed: brief mental health assessment 65 year old male patient with a recent right BKA is having difficulty adjusting to his new way of life and experiencing financial strain. Patient will be  referred to [facility] BSW for community resource support on 05/30/21. 06/19/21-Patient still denies wanting mental health resource implementation at this time. Patient has a strong support network which includes his ex spouse, CNA and neighbors. Patient reports that one of his neighbors' sons will assist him with various task like running errands or taking out his trash which is helpful to him. Patient's CNA comes daily for 2 hours in the morning and his ex spouse checks on him daily as well. Positive reinforcement provided for his current health care management  Patient was educated on available mental health resources but patient declined needing this implementation at this time and will notify [facility] LCSW if this changes. Patient is two months behind in rent at this time and is in need of community resource support. [facility] LCSW will update [facility] BSW on this financial concerns on 06/19/21 Patient is on the wait list for [place] program.  SDOH Interventions    Flowsheet Row Most Recent Value  SDOH Interventions   SDOH Interventions for the Following Domains Depression, Financial Strain, Housing, Stress  Financial Strain Interventions Other (Comment)  [Referral to [facility] BSW]  Housing Interventions Intervention Not Indicated  [Referral to [facility] BSW]  Stress Interventions Provide Counseling  Depression Interventions/Treatment  Patient refuses Treatment      Patient interviewed and appropriate assessments performed Provided patient with information about available mental health resources and healthy coping skills to help reduce stressors Discussed plans with patient for ongoing care management follow up and provided patient with direct contact information for care management team Solution-Focused Strategies, Active listening / Reflection utilized , Emotional Supportive Provided, and Participation in counseling encouraged   Patient Self Care Activities:  Self administers  medications as prescribed Attends all scheduled provider appointments Calls provider office for new concerns or questions Ability for insight Independent living Motivation for treatment Strong family or social support  Patient Coping Strengths:  Supportive Relationships Friends Hopefulness Self Advocate Able to Communicate Effectively  Patient Self Care Deficits:  Lacks social connections  Initial goal documentation     Patient Care Plan: Diabetes Type 2 (Adult)     Problem Identified: Disease Progression (Diabetes, Type 2)      Long-Range Goal: Disease Progression Prevented or Minimized   Start Date: 05/22/2021  Expected End Date: 08/21/2021  Recent Progress: On track  Priority: High  Note:   Objective:  Lab Results  Component Value Date   HGBA1C 6.7 (H) 04/15/2021   Lab Results  Component Value Date   CREATININE 1.31 (  H) 05/05/2021   CREATININE 1.21 04/28/2021   CREATININE 1.36 (H) 04/27/2021   Lab Results  Component Value Date   EGFR 86 04/07/2021   Current Barriers:  Knowledge Deficits related to basic Diabetes pathophysiology and self care/management-Scott Greer had recent right BKA, 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 He reports falling this morning during transferring chairs. He is waiting to hear from Dr. Wynetta Emery, who is scheduling PT and arranging transportation. This RNCM will follow up next week and assist with scheduling if not scheduled. He reports having all medications delivered and taking as directed. He met with someone from [place] and will start receiving a warm meal within the next two weeks.-Update-Scott Greer missed PCP appointment today-needs to call to reschedule. Patient reports receiving financial assistance rom [facility] to help pay utility bills. He is receiving MOW and trying to cut out sweets from his diet. He needs assistance affording supplies for his home. He was re certified for food benefits. [employer] stream[employer] Social Support Does not  adhere to provider recommendations re:  Unable to perform ADLs independently Unable to perform IADLs independently Does not contact provider office for questions/concerns Case Manager Clinical Goal(s):  patient will demonstrate improved adherence to prescribed treatment plan for diabetes self care/management as evidenced by: daily monitoring and recording of CBG  adherence to ADA/ carb modified diet adherence to prescribed medication regimen contacting provider for new or worsened symptoms or questions Interventions:  Inter-disciplinary care team collaboration (see longitudinal plan of care) Provided education to patient about basic DM disease process Discussed plans with patient for ongoing care management follow up and provided patient with direct contact information for care management team Reviewed scheduled/upcoming provider appointments including: calling to reschedule missed appointment with PCP Advised patient, providing education and rationale, to check cbg as directed and record, calling PCP for findings outside established parameters.   Review of patient status, including review of consultants reports, relevant laboratory and other test results, and medications completed. Advised patient to call [facility] [phone] for health rewards offered monthly and free phone benefit-discussed this benefit in depth Provided therapeutic listening Self-Care Activities - Self administers oral medications as prescribed - Self administers insulin as prescribed - Attends all scheduled provider appointments - Checks blood sugars as prescribed and utilize hyper and hypoglycemia protocol as needed - Adheres to prescribed ADA/carb modified Patient Goals: - attend all scheduled appointments:calling to reschedule missed appointment with PCP - call to arrange transportation to scheduled appointments - call [facility] [phone] for healthy benefits and benefit for a free phone - check blood sugar at  prescribed times - check blood sugar if I feel it is too high or too low - take the blood sugar log to all doctor visits - take the blood sugar meter to all doctor visits Follow Up Plan: Telephone follow up appointment with care management team member scheduled for:07/20/21 @ 1pm     Patient Care Plan: General Plan of Care (Adult)     Problem Identified: Coping Skills (General Plan of Care)      Goal: Coping Skills Enhanced   Note:   Evidence-based guidance:  Acknowledge, normalize and validate difficulty of making life-long lifestyle changes.  Identify current effective and ineffective coping strategies.  Encourage patient and caregiver participation in care to increase self-esteem, confidence and feelings of control.  Consider alternative and complementary therapy approaches such as meditation, mindfulness or yoga.  Encourage participation in cognitive behavioral therapy to foster a positive identity, increase self-awareness, as well as bolster self-esteem,  confidence and self-efficacy.  Discuss spirituality; be present as concerns are identified; encourage journaling, prayer, worship services, meditation or pastoral counseling.  Encourage participation in pleasurable group activities such as hobbies, singing, sports or volunteering).  Encourage the use of mindfulness; refer for training or intensive intervention.  Consider the use of meditative movement therapy such as tai chi, yoga or qigong.  Promote a regular daily exercise program based on tolerance, ability and patient choice to support positive thinking about disease or aging.   Notes:

## 2021-06-22 ENCOUNTER — Other Ambulatory Visit (HOSPITAL_COMMUNITY): Payer: Self-pay | Admitting: Cardiology

## 2021-06-22 DIAGNOSIS — E1152 Type 2 diabetes mellitus with diabetic peripheral angiopathy with gangrene: Secondary | ICD-10-CM | POA: Diagnosis not present

## 2021-06-23 DIAGNOSIS — E1152 Type 2 diabetes mellitus with diabetic peripheral angiopathy with gangrene: Secondary | ICD-10-CM | POA: Diagnosis not present

## 2021-06-24 DIAGNOSIS — E1152 Type 2 diabetes mellitus with diabetic peripheral angiopathy with gangrene: Secondary | ICD-10-CM | POA: Diagnosis not present

## 2021-06-25 DIAGNOSIS — E1152 Type 2 diabetes mellitus with diabetic peripheral angiopathy with gangrene: Secondary | ICD-10-CM | POA: Diagnosis not present

## 2021-06-26 ENCOUNTER — Telehealth: Payer: Self-pay | Admitting: *Deleted

## 2021-06-26 DIAGNOSIS — E1152 Type 2 diabetes mellitus with diabetic peripheral angiopathy with gangrene: Secondary | ICD-10-CM | POA: Diagnosis not present

## 2021-06-26 NOTE — Telephone Encounter (Signed)
Copied from CRM [id number]. Topic: General - Other >> Jun 26, 2021  2:32 PM Scott Greer wrote: Reason for CRM: Pt has an appt 9/2@  3:10, wants to make  sure  will have transportation, the [facility] Niece did not come for him 8/17, double amputee  FU at [phone]

## 2021-06-27 DIAGNOSIS — E1152 Type 2 diabetes mellitus with diabetic peripheral angiopathy with gangrene: Secondary | ICD-10-CM | POA: Diagnosis not present

## 2021-06-27 NOTE — Telephone Encounter (Signed)
Call placed to patient regarding transportation to his upcoming appointment.  He is currently registered with [employer].  He explained that the driver came to pick him up on 06/21/2021 and told him that he needed to pick someone else up first and then come back to get him.  The patient said that he told the driver not to bother and he rescheduled his appt. The patient said that he is able to get out of his house to meet his ride. He understands that the driver will not come into his home to assist him out the door.  Informed the patient that this CM would contact [facility] for him.  Call placed to [facility], spoke to [place] and arranged ride for 07/07/2021 appt at [facility].  Romeo Apple said that they would contact the patient the day prior to confirm. Romeo Apple is aware that the patient needs wheelchair transport.

## 2021-06-28 ENCOUNTER — Other Ambulatory Visit: Payer: Self-pay | Admitting: Family

## 2021-06-28 DIAGNOSIS — E1152 Type 2 diabetes mellitus with diabetic peripheral angiopathy with gangrene: Secondary | ICD-10-CM | POA: Diagnosis not present

## 2021-06-29 DIAGNOSIS — E1152 Type 2 diabetes mellitus with diabetic peripheral angiopathy with gangrene: Secondary | ICD-10-CM | POA: Diagnosis not present

## 2021-06-30 DIAGNOSIS — E1152 Type 2 diabetes mellitus with diabetic peripheral angiopathy with gangrene: Secondary | ICD-10-CM | POA: Diagnosis not present

## 2021-07-01 DIAGNOSIS — E1152 Type 2 diabetes mellitus with diabetic peripheral angiopathy with gangrene: Secondary | ICD-10-CM | POA: Diagnosis not present

## 2021-07-02 DIAGNOSIS — E1152 Type 2 diabetes mellitus with diabetic peripheral angiopathy with gangrene: Secondary | ICD-10-CM | POA: Diagnosis not present

## 2021-07-03 ENCOUNTER — Other Ambulatory Visit: Payer: Self-pay | Admitting: Internal Medicine

## 2021-07-03 DIAGNOSIS — E559 Vitamin D deficiency, unspecified: Secondary | ICD-10-CM

## 2021-07-03 DIAGNOSIS — E1152 Type 2 diabetes mellitus with diabetic peripheral angiopathy with gangrene: Secondary | ICD-10-CM | POA: Diagnosis not present

## 2021-07-03 NOTE — Telephone Encounter (Signed)
Requested medication (s) are due for refill today: yes  Requested medication (s) are on the active medication list: yes  Last refill:  03/07/21  Future visit scheduled: yes  Notes to clinic:  med not delegated to NT to RF   Requested Prescriptions  Pending Prescriptions Disp Refills   Vitamin D, Ergocalciferol, (DRISDOL) 1.25 MG (50000 UNIT) CAPS capsule [Pharmacy Med Name: Vitamin D2 1,250 mcg (50,000 unit) capsule] 4 capsule 2    Sig: TAKE ONE CAPSULE BY MOUTH ONCE WEEKLY ON SUNDAY     Endocrinology:  Vitamins - Vitamin D Supplementation Failed - 07/03/2021 11:32 AM      Failed - 50,000 IU strengths are not delegated      Failed - Vitamin D in normal range and within 360 days    Vit D, 25-Hydroxy  Date Value Ref Range Status  12/14/2020 9.2 (L) 30.0 - 100.0 ng/mL Final    Comment:    Vitamin D deficiency has been defined by the Institute of Medicine and an Endocrine Society practice guideline as a level of serum 25-OH vitamin D less than 20 ng/mL (1,2). The Endocrine Society went on to further define vitamin D insufficiency as a level between 21 and 29 ng/mL (2). 1. IOM (Institute of Medicine). 2010. Dietary reference    intakes for calcium and D. [place]: The    [employer]. 2. Holick MF, Binkley [place], Bischoff-Ferrari HA, et al.    Evaluation, treatment, and prevention of vitamin D    deficiency: an Endocrine Society clinical practice    guideline. JCEM. 2011 Jul; 96(7):1911-30.           Passed - Ca in normal range and within 360 days    Calcium  Date Value Ref Range Status  05/05/2021 9.2 8.9 - 10.3 mg/dL Final   Calcium, Ion  Date Value Ref Range Status  01/02/2021 1.15 1.15 - 1.40 mmol/L Final          Passed - Phosphate in normal range and within 360 days    Phosphorus  Date Value Ref Range Status  02/07/2021 3.2 2.5 - 4.6 mg/dL Final    Comment:    Performed at [facility], [address], [place], [place] [address]           Passed - Valid encounter within last 12 months    Recent Outpatient Visits           2 months ago Type 2 diabetes mellitus with other circulatory complications [facility])   [facility] [place], Gavin Pound B, MD   3 months ago Type 2 diabetes mellitus with diabetic nephropathy, without long-term current use of insulin (HCC)   [place] [facility] Jonah Blue B, MD   1 year ago Type 2 diabetes mellitus with diabetic nephropathy, without long-term current use of insulin (HCC)   [place] [facility] [name], MD   1 year ago Need for influenza vaccination   [facility] Drucilla Chalet, RPH-CPP   1 year ago Type 2 diabetes mellitus without complication, without long-term current use of insulin [facility])   [facility] [name], MD       Future Appointments             In 4 days [name], MD [facility]   In 1 week [place], [place], [name]  [facility]

## 2021-07-04 DIAGNOSIS — E1152 Type 2 diabetes mellitus with diabetic peripheral angiopathy with gangrene: Secondary | ICD-10-CM | POA: Diagnosis not present

## 2021-07-05 DIAGNOSIS — E1152 Type 2 diabetes mellitus with diabetic peripheral angiopathy with gangrene: Secondary | ICD-10-CM | POA: Diagnosis not present

## 2021-07-06 DIAGNOSIS — Z419 Encounter for procedure for purposes other than remedying health state, unspecified: Secondary | ICD-10-CM | POA: Diagnosis not present

## 2021-07-06 DIAGNOSIS — E1152 Type 2 diabetes mellitus with diabetic peripheral angiopathy with gangrene: Secondary | ICD-10-CM | POA: Diagnosis not present

## 2021-07-07 ENCOUNTER — Ambulatory Visit: Payer: Medicaid Other | Admitting: Internal Medicine

## 2021-07-07 DIAGNOSIS — E1152 Type 2 diabetes mellitus with diabetic peripheral angiopathy with gangrene: Secondary | ICD-10-CM | POA: Diagnosis not present

## 2021-07-08 DIAGNOSIS — E1152 Type 2 diabetes mellitus with diabetic peripheral angiopathy with gangrene: Secondary | ICD-10-CM | POA: Diagnosis not present

## 2021-07-10 DIAGNOSIS — E1152 Type 2 diabetes mellitus with diabetic peripheral angiopathy with gangrene: Secondary | ICD-10-CM | POA: Diagnosis not present

## 2021-07-11 ENCOUNTER — Telehealth: Payer: Self-pay | Admitting: Family

## 2021-07-11 DIAGNOSIS — E1152 Type 2 diabetes mellitus with diabetic peripheral angiopathy with gangrene: Secondary | ICD-10-CM | POA: Diagnosis not present

## 2021-07-11 NOTE — Telephone Encounter (Signed)
Pt called requesting a call back from someone in Dr. Lajoyce Corners staff. Pt did not disclose reasoning for his call. Please call pt at [phone].

## 2021-07-12 DIAGNOSIS — E1152 Type 2 diabetes mellitus with diabetic peripheral angiopathy with gangrene: Secondary | ICD-10-CM | POA: Diagnosis not present

## 2021-07-12 NOTE — Telephone Encounter (Signed)
I tried to call pt and there was no answer and voicemail was full. I will hold and try again.

## 2021-07-13 DIAGNOSIS — E1152 Type 2 diabetes mellitus with diabetic peripheral angiopathy with gangrene: Secondary | ICD-10-CM | POA: Diagnosis not present

## 2021-07-13 NOTE — Telephone Encounter (Signed)
Third time reaching out to the pt and the mail box is full. Will sign off on message unable to reach pt.

## 2021-07-13 NOTE — Telephone Encounter (Signed)
I tried to call pt again this morning and the mail box is full. I will hold message and try again this afternoon.

## 2021-07-14 ENCOUNTER — Encounter: Payer: Self-pay | Admitting: Physician Assistant

## 2021-07-14 ENCOUNTER — Telehealth: Payer: Self-pay | Admitting: Orthopedic Surgery

## 2021-07-14 ENCOUNTER — Other Ambulatory Visit: Payer: Self-pay

## 2021-07-14 ENCOUNTER — Ambulatory Visit (INDEPENDENT_AMBULATORY_CARE_PROVIDER_SITE_OTHER): Payer: Medicaid Other | Admitting: Physician Assistant

## 2021-07-14 DIAGNOSIS — Z89511 Acquired absence of right leg below knee: Secondary | ICD-10-CM

## 2021-07-14 DIAGNOSIS — T8781 Dehiscence of amputation stump: Secondary | ICD-10-CM

## 2021-07-14 DIAGNOSIS — E1152 Type 2 diabetes mellitus with diabetic peripheral angiopathy with gangrene: Secondary | ICD-10-CM | POA: Diagnosis not present

## 2021-07-14 MED ORDER — OXYCODONE HCL 10 MG PO TABS: 10.0000 mg | ORAL_TABLET | Freq: Four times a day (QID) | ORAL | 0 refills | Status: DC | PRN

## 2021-07-14 NOTE — Telephone Encounter (Signed)
Scott Greer from transportation call asking if Lajoyce Corners referred him to [facility]. They said they can take him with cost fee.   CB [phone]

## 2021-07-14 NOTE — Telephone Encounter (Signed)
I spoke with pt while he was in the office today to advise that the transportation I arranged for him was just for [facility] facilities it would not cover a non [facility] office visit . Pt voiced understanding and will arrange transportation through another service for his prosthetic eval.

## 2021-07-14 NOTE — Progress Notes (Signed)
Office Visit Note   Patient: Scott Greer           Date of Birth: 03-13-1956           MRN: [id number] Visit Date: 07/14/2021              Requested by: [name], MD [address] [place],  [place] [address] PCP: [name], MD  Chief Complaint  Patient presents with   Right Leg - Routine Post Op    04/26/21 right BKA       HPI: Patient is not quite 2 months status post right below-knee amputation.  He is also status post left below-knee amputation.  Overall he has been doing well.  He did have a fall onto his stumps and has have some soreness in them.  He is going to start working with [employer] to fabricate his prosthetics  Assessment & Plan: Visit Diagnoses: No diagnosis found.  Plan: Patient already has an appointment with physical therapy who will start working with him once he receives his prosthetics.  He may follow-up with [place] for 1 final time in 2 months.  Sooner if any concerns  Follow-Up Instructions: No follow-ups on file.   Ortho Exam  Patient is alert, oriented, no adenopathy, well-dressed, normal affect, normal respiratory effort. Examination demonstrates well-healed amputation stumps.  Minimal to no soft tissue swelling no redness no skin breakdown he has full range of motion of his knees minimally tender to the end of the stump where he fell  Imaging: No results found. No images are attached to the encounter.  Labs: Lab Results  Component Value Date   HGBA1C 6.7 (H) 04/15/2021   HGBA1C 9.1 (H) 02/08/2021   HGBA1C 8.6 (H) 02/07/2021   ESRSEDRATE 78 (H) 06/04/2019   CRP 4.5 (H) 06/04/2019   REPTSTATUS 02/11/2021 FINAL 02/06/2021   REPTSTATUS 02/11/2021 FINAL 02/06/2021   CULT  02/06/2021    NO GROWTH 5 DAYS Performed at [facility], [address] [place], [place] [address]    CULT  02/06/2021    NO GROWTH 5 DAYS Performed at [facility], [address], [place], [place] [address]      Lab Results  Component  Value Date   ALBUMIN 2.8 (L) 05/05/2021   ALBUMIN 3.8 04/07/2021   ALBUMIN 2.3 (L) 02/07/2021   PREALBUMIN 13.8 (L) 06/04/2019    Lab Results  Component Value Date   MG 1.8 02/07/2021   MG 2.0 01/05/2021   MG 2.1 01/04/2021   Lab Results  Component Value Date   VD25OH 9.2 (L) 12/14/2020    Lab Results  Component Value Date   PREALBUMIN 13.8 (L) 06/04/2019   CBC EXTENDED Latest Ref Rng & Units 05/05/2021 04/28/2021 04/27/2021  WBC 4.0 - 10.5 K/uL 12.8(H) 14.4(H) 16.3(H)  RBC 4.22 - 5.81 MIL/uL 2.67(L) 2.91(L) 2.82(L)  HGB 13.0 - 17.0 g/dL 7.5(L) 8.0(L) 7.8(L)  HCT 39.0 - 52.0 % 23.5(L) 25.3(L) 24.2(L)  PLT 150 - 400 K/uL 619(H) 575(H) 498(H)  NEUTROABS 1.7 - 7.7 K/uL - - -  LYMPHSABS 0.7 - 4.0 K/uL - - -     There is no height or weight on file to calculate BMI.  Orders:  No orders of the defined types were placed in this encounter.  No orders of the defined types were placed in this encounter.    Procedures: No procedures performed  Clinical Data: No additional findings.  ROS:  All other systems  negative, except as noted in the HPI. Review of Systems  Objective: Vital Signs: There were no vitals taken for this visit.  Specialty Comments:  No specialty comments available.  PMFS History: Patient Active Problem List   Diagnosis Date Noted   Acute hematogenous osteomyelitis of right foot (HCC) 04/26/2021   Gangrene of right foot (HCC)    Foot pain, right 04/14/2021   Subacute osteomyelitis, right ankle and foot (HCC)    Status post below-knee amputation (HCC) 02/17/2021   Wound dehiscence    Gangrene of toe of left foot (HCC)    Cutaneous abscess of left foot    Left foot infection 02/07/2021   Leukocytosis 02/07/2021   Thrombocytosis 02/07/2021   Hyponatremia 02/07/2021   AKI (acute kidney injury) (HCC) 02/07/2021   Hyperglycemia due to diabetes mellitus (HCC) 02/07/2021   Hypoalbuminemia due to protein-calorie malnutrition (HCC) 02/07/2021    Osteomyelitis of second toe of left foot (HCC)    New onset of congestive heart failure (HCC) 12/26/2020   CKD (chronic kidney disease) stage 3, GFR 30-59 ml/min (HCC) 12/26/2020   Acute systolic CHF (congestive heart failure) (HCC) 12/26/2020   Anemia, chronic disease 06/22/2020   Amputee, great toe, right (HCC) 06/20/2020   Normocytic anemia 06/20/2020   Erectile dysfunction associated with type 2 diabetes mellitus (HCC) 06/20/2020   Positive for macroalbuminuria 10/24/2019   Amputation of left great toe (HCC) 10/23/2019   Tobacco dependence 10/23/2019   Osteomyelitis of great toe of right foot (HCC) 06/04/2019   Diabetic foot infection (HCC)    Noncompliance    Glaucoma    Hyperlipidemia    Essential hypertension 11/06/2003   Diabetes mellitus (HCC) 11/05/1997   Past Medical History:  Diagnosis Date   CHF (congestive heart failure) (HCC)    Dehiscence of amputation stump (HCC)    left great toe   Diabetes mellitus without complication (HCC) 1999   Type II   Glaucoma 2015   Hyperlipidemia    Hypertension 2005   Left foot infection 02/07/2021   Osteomyelitis (HCC)    Wears glasses     Family History  Problem Relation Age of Onset   Stroke Mother    Diabetes Mother        Toward end of life    Past Surgical History:  Procedure Laterality Date   AMPUTATION Left 06/05/2019   Procedure: LEFT GREAT TOE AMPUTATION;  Surgeon: [name], MD;  Location: [facility] OR;  Service: Orthopedics;  Laterality: Left;   AMPUTATION Left 07/10/2019   Procedure: LEFT FOOT 1ST RAY AMPUTATION;  Surgeon: [name], MD;  Location: [facility] OR;  Service: Orthopedics;  Laterality: Left;   AMPUTATION Right 05/25/2020   Procedure: RIGHT GREAT TOE AMPUTATION;  Surgeon: [name], MD;  Location: [facility] OR;  Service: Orthopedics;  Laterality: Right;   AMPUTATION Left 01/25/2021   Procedure: LEFT 2ND TOE AMPUTATION;  Surgeon: [name], MD;  Location: [facility] OR;  Service: Orthopedics;  Laterality: Left;    AMPUTATION Left 02/08/2021   Procedure: LEFT TRANSMETATARSAL AMPUTATION;  Surgeon: [name], MD;  Location: [facility] OR;  Service: Orthopedics;  Laterality: Left;   AMPUTATION Left 02/10/2021   Procedure: AMPUTATION BELOW KNEE;  Surgeon: [name], MD;  Location: [facility] OR;  Service: Orthopedics;  Laterality: Left;   AMPUTATION Right 04/14/2021   Procedure: RIGHT 1ST AND 2ND RAY AMPUTATION;  Surgeon: [name], MD;  Location: [facility] OR;  Service: Orthopedics;  Laterality: Right;   AMPUTATION Right  04/26/2021   Procedure: RIGHT BELOW KNEE AMPUTATION;  Surgeon: [name], MD;  Location: [facility] OR;  Service: Orthopedics;  Laterality: Right;   NO PAST SURGERIES     RIGHT/LEFT HEART CATH AND CORONARY ANGIOGRAPHY N/A 01/02/2021   Procedure: RIGHT/LEFT HEART CATH AND CORONARY ANGIOGRAPHY;  Surgeon: Runell Gess, MD;  [facility]: [facility] INV[facility] LAB;  Service: Cardiovascular;  Laterality: N/A;   Social History   Occupational History   Not on file  Tobacco Use   Smoking status: Every Day    Types: Cigars, Cigarettes   Smokeless tobacco: Former    Types: Chew   Tobacco comments:    6 daily  Vaping Use   Vaping Use: Never used  Substance and Sexual Activity   Alcohol use: Yes    Alcohol/week: 11.0 standard drinks    Types: 3 Cans of beer, 8 Shots of liquor per week    Comment: occasional   Drug use: Yes    Types: Marijuana    Comment: ocassional - last time 05/08/2020   Sexual activity: Not Currently

## 2021-07-16 DIAGNOSIS — E1152 Type 2 diabetes mellitus with diabetic peripheral angiopathy with gangrene: Secondary | ICD-10-CM | POA: Diagnosis not present

## 2021-07-17 DIAGNOSIS — E1152 Type 2 diabetes mellitus with diabetic peripheral angiopathy with gangrene: Secondary | ICD-10-CM | POA: Diagnosis not present

## 2021-07-18 ENCOUNTER — Other Ambulatory Visit: Payer: Self-pay

## 2021-07-18 ENCOUNTER — Encounter: Payer: Self-pay | Admitting: Internal Medicine

## 2021-07-18 ENCOUNTER — Ambulatory Visit: Payer: Medicaid Other | Attending: Physician Assistant | Admitting: Internal Medicine

## 2021-07-18 VITALS — BP 120/79 | HR 88 | Resp 16

## 2021-07-18 DIAGNOSIS — E1122 Type 2 diabetes mellitus with diabetic chronic kidney disease: Secondary | ICD-10-CM | POA: Diagnosis not present

## 2021-07-18 DIAGNOSIS — I10 Essential (primary) hypertension: Secondary | ICD-10-CM | POA: Diagnosis not present

## 2021-07-18 DIAGNOSIS — Z23 Encounter for immunization: Secondary | ICD-10-CM

## 2021-07-18 DIAGNOSIS — Z7982 Long term (current) use of aspirin: Secondary | ICD-10-CM | POA: Diagnosis not present

## 2021-07-18 DIAGNOSIS — E1159 Type 2 diabetes mellitus with other circulatory complications: Secondary | ICD-10-CM | POA: Diagnosis not present

## 2021-07-18 DIAGNOSIS — Z89512 Acquired absence of left leg below knee: Secondary | ICD-10-CM | POA: Diagnosis not present

## 2021-07-18 DIAGNOSIS — Z1211 Encounter for screening for malignant neoplasm of colon: Secondary | ICD-10-CM | POA: Insufficient documentation

## 2021-07-18 DIAGNOSIS — Z89511 Acquired absence of right leg below knee: Secondary | ICD-10-CM | POA: Insufficient documentation

## 2021-07-18 DIAGNOSIS — N183 Chronic kidney disease, stage 3 unspecified: Secondary | ICD-10-CM | POA: Diagnosis not present

## 2021-07-18 DIAGNOSIS — I2510 Atherosclerotic heart disease of native coronary artery without angina pectoris: Secondary | ICD-10-CM | POA: Insufficient documentation

## 2021-07-18 DIAGNOSIS — E1152 Type 2 diabetes mellitus with diabetic peripheral angiopathy with gangrene: Secondary | ICD-10-CM | POA: Diagnosis not present

## 2021-07-18 DIAGNOSIS — Z833 Family history of diabetes mellitus: Secondary | ICD-10-CM | POA: Diagnosis not present

## 2021-07-18 DIAGNOSIS — F17200 Nicotine dependence, unspecified, uncomplicated: Secondary | ICD-10-CM | POA: Diagnosis not present

## 2021-07-18 DIAGNOSIS — N521 Erectile dysfunction due to diseases classified elsewhere: Secondary | ICD-10-CM | POA: Diagnosis not present

## 2021-07-18 DIAGNOSIS — L089 Local infection of the skin and subcutaneous tissue, unspecified: Secondary | ICD-10-CM | POA: Diagnosis not present

## 2021-07-18 DIAGNOSIS — I130 Hypertensive heart and chronic kidney disease with heart failure and stage 1 through stage 4 chronic kidney disease, or unspecified chronic kidney disease: Secondary | ICD-10-CM | POA: Diagnosis not present

## 2021-07-18 DIAGNOSIS — Z7984 Long term (current) use of oral hypoglycemic drugs: Secondary | ICD-10-CM | POA: Diagnosis not present

## 2021-07-18 DIAGNOSIS — Z79899 Other long term (current) drug therapy: Secondary | ICD-10-CM | POA: Diagnosis not present

## 2021-07-18 DIAGNOSIS — Z7901 Long term (current) use of anticoagulants: Secondary | ICD-10-CM | POA: Diagnosis not present

## 2021-07-18 DIAGNOSIS — E1169 Type 2 diabetes mellitus with other specified complication: Secondary | ICD-10-CM | POA: Diagnosis not present

## 2021-07-18 DIAGNOSIS — F17290 Nicotine dependence, other tobacco product, uncomplicated: Secondary | ICD-10-CM | POA: Insufficient documentation

## 2021-07-18 DIAGNOSIS — I5042 Chronic combined systolic (congestive) and diastolic (congestive) heart failure: Secondary | ICD-10-CM | POA: Insufficient documentation

## 2021-07-18 DIAGNOSIS — I96 Gangrene, not elsewhere classified: Secondary | ICD-10-CM | POA: Diagnosis not present

## 2021-07-18 DIAGNOSIS — T8130XA Disruption of wound, unspecified, initial encounter: Secondary | ICD-10-CM | POA: Diagnosis not present

## 2021-07-18 DIAGNOSIS — H538 Other visual disturbances: Secondary | ICD-10-CM | POA: Insufficient documentation

## 2021-07-18 DIAGNOSIS — D638 Anemia in other chronic diseases classified elsewhere: Secondary | ICD-10-CM | POA: Insufficient documentation

## 2021-07-18 DIAGNOSIS — L02612 Cutaneous abscess of left foot: Secondary | ICD-10-CM | POA: Diagnosis not present

## 2021-07-18 LAB — POCT GLYCOSYLATED HEMOGLOBIN (HGB A1C): HbA1c, POC (controlled diabetic range): 6 % (ref 0.0–7.0)

## 2021-07-18 LAB — GLUCOSE, POCT (MANUAL RESULT ENTRY): POC Glucose: 173 mg/dl — AB (ref 70–99)

## 2021-07-18 NOTE — Progress Notes (Signed)
Patient ID: Scott Greer, male    DOB: 1956-02-08  MRN: [id number]  CC: chronic ds management  Subjective: Scott Greer is a 65 y.o. 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  Gets Meds from [facility].  Had RT BKA since last visit with me Will get molding done this mth for prosthesis from [employer] Has PCS 6 days a wk.  His aid does not come out on Sundays Had a fall out of wheelchair 2 wks ago.  Reports he was suppose to have home P.T.  However, went to outp-rehab a few times. Reports being told that they will work more once he gets his prosthesis -Post right BKA, he was noted to have anemia with H/H of 7.5/23.5 and creatinine of 1.31.  This was May 05, 2021.  He denies any dizziness or fatigue.  DM:   Results for orders placed or performed in visit on 07/18/21  POCT glucose (manual entry)  Result Value Ref Range   POC Glucose 173 (A) 70 - 99 mg/dl  POCT glycosylated hemoglobin (Hb A1C)  Result Value Ref Range   Hemoglobin A1C     HbA1c POC (<> result, manual entry)     HbA1c, POC (prediabetic range)     HbA1c, POC (controlled diabetic range) 6.0 0.0 - 7.0 %  Does not check BS consistently.  According to his med list he should be on metformin and Jardiance. Gets Meals on Meals and tries to do some cooking himself Over due for eye exam.  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  Will need transportation arranged once he gets appt with eye doctor.  Referred on last visit with me in June but he was subsequently hospitalized shortly thereafter and had right BKA.  HTN/CHF: no LE edema.  No SOB or CP. No PND/orthopnea.  He does not know the names of any of his medications but  states he takes what [facility].  Based on his medication list, he should be on BiDil, carvedilol, Entresto, potassium supplement, spironolactone, torsemide, amiodarone and Jardiance.  He used to have ENT have a Maricela Bo with the heart failure program coming out to check on him once a week but looks like she has not been out since June.  He is overdue for follow-up with cardiology. Still smokes cigars not ready to quit.  ED: I did prescribe Cialis for him in August of last year.  This was before he was placed on BiDil.  Reports Cialis did not work for him.  Would like to try Viagra  HM: Had 2 COVID shots.  Due for flu shot and eye exam  Patient Active Problem List   Diagnosis Date Noted   Acute hematogenous osteomyelitis of right foot ([place]) 04/26/2021   Gangrene of right foot ([place])    Foot pain, right 04/14/2021   Subacute osteomyelitis, right ankle and foot ([place])    Status post below-knee amputation ([place]) 02/17/2021   Wound dehiscence    Gangrene of toe of left foot ([place])    Cutaneous abscess of left foot    Left foot infection 02/07/2021   Leukocytosis 02/07/2021   Thrombocytosis 02/07/2021   Hyponatremia 02/07/2021  AKI (acute kidney injury) ([place]) 02/07/2021   Hyperglycemia due to diabetes mellitus ([place]) 02/07/2021   Hypoalbuminemia due to protein-calorie malnutrition ([place]) 02/07/2021   Osteomyelitis of second toe of left foot ([place])    New onset of congestive heart failure ([place]) 12/26/2020   CKD (chronic kidney disease) stage 3, GFR 30-59 ml/min (HCC) 85/27/7824   Acute systolic CHF (congestive heart failure) ([place]) 12/26/2020   Anemia, chronic disease 06/22/2020   Amputee, great toe, right ([place]) 06/20/2020   Normocytic anemia 06/20/2020   Erectile dysfunction associated with type 2 diabetes mellitus ([place]) 06/20/2020   Positive for macroalbuminuria 10/24/2019   Amputation of left great toe ([place]) 10/23/2019   Tobacco dependence 10/23/2019    Osteomyelitis of great toe of right foot ([place]) 06/04/2019   Diabetic foot infection ([place])    Noncompliance    Glaucoma    Hyperlipidemia    Essential hypertension 11/06/2003   Diabetes mellitus ([place]) 11/05/1997     Current Outpatient Medications on File Prior to Visit  Medication Sig Dispense Refill   amiodarone (PACERONE) 200 MG tablet Take 1 tablet (200 mg total) by mouth daily. (Patient taking differently: Take 200 mg by mouth daily with lunch.) 30 tablet 6   aspirin 81 MG chewable tablet Chew 1 tablet (81 mg total) by mouth daily. (Patient taking differently: Chew 81 mg by mouth daily with breakfast.) 30 tablet 6   atorvastatin (LIPITOR) 10 MG tablet Take 1 tablet (10 mg total) by mouth daily. (Patient taking differently: Take 10 mg by mouth daily with supper.) 30 tablet 6   BIDIL 20-37.5 MG tablet TAKE ONE TABLET BY MOUTH EVERY MORNING and TAKE ONE TABLET BY MOUTH AT NOON and TAKE ONE TABLET BY MOUTH EVERY EVENING 90 tablet 2   Blood Glucose Monitoring Suppl (TRUE METRIX METER) w/Device KIT Use as directed 1 kit 0   carvedilol (COREG) 3.125 MG tablet TAKE ONE TABLET BY MOUTH EVERY MORNING and TAKE ONE TABLET BY MOUTH EVERY EVENING WITH A MEAL 60 tablet 2   digoxin (LANOXIN) 0.125 MG tablet Take 1 tablet (0.125 mg total) by mouth daily. (Patient taking differently: Take 0.125 mg by mouth daily with supper.) 30 tablet 6   empagliflozin (JARDIANCE) 10 MG TABS tablet Take 1 tablet (10 mg total) by mouth daily. (Patient taking differently: Take 10 mg by mouth daily with lunch.) 30 tablet 6   ENTRESTO 24-26 MG TAKE ONE TABLET BY MOUTH EVERY MORNING and TAKE ONE TABLET BY MOUTH EVERY EVENING 60 tablet 2   ferrous sulfate 325 (65 FE) MG tablet Take 1 tablet (325 mg total) by mouth daily with breakfast. 100 tablet 1   gabapentin (NEURONTIN) 300 MG capsule TAKE ONE CAPSULE BY MOUTH THREE TIMES DAILY 90 capsule 5   glucose blood (TRUE METRIX BLOOD GLUCOSE TEST) test strip Use as instructed 100 each  12   metFORMIN (GLUCOPHAGE) 500 MG tablet TAKE ONE TABLET BY MOUTH TWICE DAILY WITH A MEAL 60 tablet 1   nutrition supplement, JUVEN, (JUVEN) PACK Take 1 packet by mouth 2 (two) times daily between meals.  0   Oxycodone HCl 10 MG TABS Take 1 tablet (10 mg total) by mouth every 6 (six) hours as needed (pain score 7-10). 30 tablet 0   potassium chloride SA (KLOR-CON) 20 MEQ tablet TAKE ONE TABLET BY MOUTH EVERY MORNING 30 tablet 2   spironolactone (ALDACTONE) 25 MG tablet TAKE ONE TABLET BY MOUTH EVERY MORNING 30 tablet 2   torsemide (DEMADEX) 20 MG tablet Take 1  tablet (20 mg total) by mouth daily. 30 tablet 2   TRUEplus Lancets 28G MISC Use as directed 100 each 4   Vitamin D, Ergocalciferol, (DRISDOL) 1.25 MG (50000 UNIT) CAPS capsule Take 1 capsule (50,000 Units total) by mouth every 7 (seven) days. (Patient taking differently: Take 50,000 Units by mouth every Sunday.) 4 capsule 2   zinc sulfate 220 (50 Zn) MG capsule Take 1 capsule (220 mg total) by mouth daily.     No current facility-administered medications on file prior to visit.    Allergies  Allergen Reactions   Bee Venom Hives, Itching and Swelling    Social History   Socioeconomic History   Marital status: Legally Separated    Spouse name: Tawana   Number of children: 2   Years of education: Not on file   Highest education level: Associate degree: occupational, Hotel manager, or vocational program  Occupational History   Not on file  Tobacco Use   Smoking status: Every Day    Types: Cigars, Cigarettes   Smokeless tobacco: Former    Types: Chew   Tobacco comments:    6 daily  Vaping Use   Vaping Use: Never used  Substance and Sexual Activity   Alcohol use: Yes    Alcohol/week: 11.0 standard drinks    Types: 3 Cans of beer, 8 Shots of liquor per week    Comment: occasional   Drug use: Yes    Types: Marijuana    Comment: ocassional - last time 05/08/2020   Sexual activity: Not Currently  Other Topics Concern   Not on  file  Social History Narrative   Out of prison for 2 months.   Lives at home with his wife.   Social Determinants of Health   Financial Resource Strain: High Risk   Difficulty of Paying Living Expenses: Very hard  Food Insecurity: Landscape architect Present   Worried About Charity fundraiser in the Last Year: Sometimes true   Arboriculturist in the Last Year: Sometimes true  Transportation Needs: Unmet Transportation Needs   Lack of Transportation (Medical): Yes   Lack of Transportation (Non-Medical): Yes  Physical Activity: Inactive   Days of Exercise per Week: 0 days   Minutes of Exercise per Session: 0 min  Stress: Not on file  Social Connections: Not on file  Intimate Partner Violence: Not on file    Family History  Problem Relation Age of Onset   Stroke Mother    Diabetes Mother        Toward end of life    Past Surgical History:  Procedure Laterality Date   AMPUTATION Left 06/05/2019   Procedure: LEFT GREAT TOE AMPUTATION;  Surgeon: Newt Minion, MD;  Location: [place];  Service: Orthopedics;  Laterality: Left;   AMPUTATION Left 07/10/2019   Procedure: LEFT FOOT 1ST RAY AMPUTATION;  Surgeon: Newt Minion, MD;  Location: [place];  Service: Orthopedics;  Laterality: Left;   AMPUTATION Right 05/25/2020   Procedure: RIGHT GREAT TOE AMPUTATION;  Surgeon: Newt Minion, MD;  Location: [place];  Service: Orthopedics;  Laterality: Right;   AMPUTATION Left 01/25/2021   Procedure: LEFT 2ND TOE AMPUTATION;  Surgeon: Newt Minion, MD;  Location: [place];  Service: Orthopedics;  Laterality: Left;   AMPUTATION Left 02/08/2021   Procedure: LEFT TRANSMETATARSAL AMPUTATION;  Surgeon: Newt Minion, MD;  Location: [place];  Service: Orthopedics;  Laterality: Left;   AMPUTATION Left 02/10/2021   Procedure: AMPUTATION BELOW KNEE;  Surgeon: Newt Minion, MD;  Location: [place];  Service: Orthopedics;  Laterality: Left;   AMPUTATION Right 04/14/2021   Procedure: RIGHT 1ST AND 2ND RAY AMPUTATION;   Surgeon: Newt Minion, MD;  Location: [place];  Service: Orthopedics;  Laterality: Right;   AMPUTATION Right 04/26/2021   Procedure: RIGHT BELOW KNEE AMPUTATION;  Surgeon: Newt Minion, MD;  Location: [place];  Service: Orthopedics;  Laterality: Right;   NO PAST SURGERIES     RIGHT/LEFT HEART CATH AND CORONARY ANGIOGRAPHY N/A 01/02/2021   Procedure: RIGHT/LEFT HEART CATH AND CORONARY ANGIOGRAPHY;  Surgeon: Lorretta Harp, MD;  Location: [place] [facility] LAB;  Service: Cardiovascular;  Laterality: N/A;    ROS: Review of Systems Negative except as stated above  PHYSICAL EXAM: BP 120/79   Pulse 88   Resp 16   SpO2 99%   Physical Exam   General appearance - alert, well appearing, older African-American male and in no distress.  Patient sitting in wheelchair. Mental status - normal mood, behavior, speech, dress, motor activity, and thought processes Mouth - mucous membranes moist, pharynx normal without lesions Neck - supple, no significant adenopathy Chest - clear to auscultation, no wheezes, rales or rhonchi, symmetric air entry Heart - normal rate, regular rhythm, normal S1, S2, no murmurs, rubs, clicks or gallops Extremities -patient has shrinker socks on both BKA stumps.  I did not remove the socks.  No lower extremity edema.  CMP Latest Ref Rng & Units 05/05/2021 04/28/2021 04/27/2021  Glucose 70 - 99 mg/dL 158(H) 220(H) 188(H)  BUN 8 - 23 mg/dL 36(H) 37(H) 32(H)  Creatinine 0.61 - 1.24 mg/dL 1.31(H) 1.21 1.36(H)  Sodium 135 - 145 mmol/L 136 132(L) 128(L)  Potassium 3.5 - 5.1 mmol/L 5.0 5.1 4.7  Chloride 98 - 111 mmol/L 95(L) 96(L) 91(L)  CO2 22 - 32 mmol/L _0 Calcium 8.9 - 10.3 mg/dL 9.2 8.4(L) 8.5(L)  Total Protein 6.5 - 8.1 g/dL 6.7 - -  Total Bilirubin 0.3 - 1.2 mg/dL 0.4 - -  Alkaline Phos 38 - 126 U/L 85 - -  AST 15 - 41 U/L 19 - -  ALT 0 - 44 U/L 14 - -   Lipid Panel     Component Value Date/Time   CHOL 125 12/14/2020 1156   TRIG 90 12/14/2020 1156   HDL 61  12/14/2020 1156   CHOLHDL 2.0 12/14/2020 1156   LDLCALC 47 12/14/2020 1156    CBC    Component Value Date/Time   WBC 12.8 (H) 05/05/2021 2344   RBC 2.67 (L) 05/05/2021 2344   HGB 7.5 (L) 05/05/2021 2344   HGB 9.4 (L) 04/07/2021 1517   HCT 23.5 (L) 05/05/2021 2344   HCT 28.1 (L) 04/07/2021 1517   PLT 619 (H) 05/05/2021 2344   PLT 440 04/07/2021 1517   MCV 88.0 05/05/2021 2344   MCV 84 04/07/2021 1517   MCH 28.1 05/05/2021 2344   MCHC 31.9 05/05/2021 2344   RDW 15.2 05/05/2021 2344   RDW 15.2 04/07/2021 1517   LYMPHSABS 0.9 02/06/2021 2057   LYMPHSABS 0.7 12/14/2020 1156   MONOABS 2.9 (H) 02/06/2021 2057   EOSABS 0.0 02/06/2021 2057   EOSABS 0.0 12/14/2020 1156   BASOSABS 0.0 02/06/2021 2057   BASOSABS 0.0 12/14/2020 1156    ASSESSMENT AND PLAN: 1. Type 2 diabetes mellitus with other circulatory complications (HCC) U3A is at goal.  Continue Jardiance and metformin.  We will check a BMP today.  Referral submitted for  eye exam. - POCT glucose (manual entry) - POCT glycosylated hemoglobin (Hb A1C) - Ambulatory referral to Ophthalmology  2. Essential hypertension At goal.  Patient to continue his current medications  3. Erectile dysfunction associated with type 2 diabetes mellitus ([place]) Patient is on BiDil that contains a nitrate.  Because of this I told him I cannot put him on Cialis or Viagra.  I told him that I can refer to urology to discuss other options.  He is agreeable to this.  4. Tobacco dependence Advised to quit.  Patient not ready to give a trial of quitting.  5. Chronic combined systolic and diastolic CHF (congestive heart failure) ([place]) Compensated.  Continue current medications. Will arrange follow-up with cardiology. - Basic metabolic panel  6. Anemia, chronic disease - CBC  7. S/P BKA (below knee amputation) bilateral ([place])   8. Need for immunization against influenza - Flu Vaccine QUAD 41moIM (Fluarix, Fluzone & Alfiuria Quad PF)  9.  Colon  cancer screening. Discussed ways to screen.  Patient agreeable to doing the Cologuard kit.   Patient was given the opportunity to ask questions.  Patient verbalized understanding of the plan and was able to repeat key elements of the plan.   Orders Placed This Encounter  Procedures   POCT glucose (manual entry)   POCT glycosylated hemoglobin (Hb A1C)     Requested Prescriptions    No prescriptions requested or ordered in this encounter    No follow-ups on file.  DKarle Plumber MD, FACP

## 2021-07-19 ENCOUNTER — Telehealth: Payer: Self-pay

## 2021-07-19 DIAGNOSIS — E1152 Type 2 diabetes mellitus with diabetic peripheral angiopathy with gangrene: Secondary | ICD-10-CM | POA: Diagnosis not present

## 2021-07-19 LAB — BASIC METABOLIC PANEL
BUN/Creatinine Ratio: 14 (ref 10–24)
BUN: 17 mg/dL (ref 8–27)
CO2: 26 mmol/L (ref 20–29)
Calcium: 10.1 mg/dL (ref 8.6–10.2)
Chloride: 99 mmol/L (ref 96–106)
Creatinine, Ser: 1.18 mg/dL (ref 0.76–1.27)
Glucose: 147 mg/dL — ABNORMAL HIGH (ref 65–99)
Potassium: 4 mmol/L (ref 3.5–5.2)
Sodium: 141 mmol/L (ref 134–144)
eGFR: 70 mL/min/{1.73_m2} (ref >59)

## 2021-07-19 LAB — CBC
Hematocrit: 34 % — ABNORMAL LOW (ref 37.5–51.0)
Hemoglobin: 11.6 g/dL — ABNORMAL LOW (ref 13.0–17.7)
MCH: 28.2 pg (ref 26.6–33.0)
MCHC: 34.1 g/dL (ref 31.5–35.7)
MCV: 83 fL (ref 79–97)
Platelets: 320 10*3/uL (ref 150–450)
RBC: 4.12 x10E6/uL — ABNORMAL LOW (ref 4.14–5.80)
RDW: 14.2 % (ref 11.6–15.4)
WBC: 7.6 10*3/uL (ref 3.4–10.8)

## 2021-07-19 NOTE — Telephone Encounter (Signed)
Contacted pt to go over lab results pt didn't answer voicemail was full.  Sent a CRM and forward labs to NT to give pt labs when they call back

## 2021-07-19 NOTE — Progress Notes (Signed)
Anemia has improved.  We will have him continue the iron supplement for another 1 to 2 months. Kidney function improved.

## 2021-07-20 ENCOUNTER — Ambulatory Visit: Payer: Self-pay

## 2021-07-20 ENCOUNTER — Telehealth: Payer: Self-pay | Admitting: Licensed Clinical Social Worker

## 2021-07-20 ENCOUNTER — Telehealth: Payer: Self-pay

## 2021-07-20 NOTE — Telephone Encounter (Signed)
Call placed to patient regarding transportation to upcoming appointments. He has been referred to ophthalmology and cardiology.   Explained to him that he can contact his insurance company to schedule rides to appointments - [facility] # [phone] - provided him with the phone number and told him that they need  2-3 days notice to schedule the ride.  If they are not able to provide transportation for some reason, [facility] can provide transportation. He said he has the phone number for [facility] transportation and has used the service in the past.    Instructed him to call this clinic if he has difficulty arranging his rides and he said he understood.

## 2021-07-20 NOTE — Patient Outreach (Signed)
[facility]) Care Management  07/20/2021  Scott Greer 31-Aug-1956 [id number]  LCSW completed [facility] outreach attempt today but was unable to reach patient successfully. A HIPPA compliant voice message was unable to be left encouraging patient to return call once available as mailbox was full. [facility] LCSW completed two outreaches and both calls went straight to voice mail. LCSW will ask Scheduling Care Guide to reschedule [facility] SW appointment with patient as well.  Dickie La, BSW, MSW, [employer] Managed Medicaid LCSW [facility]  [facility] [email or web address] Phone: [phone]

## 2021-07-20 NOTE — Patient Instructions (Signed)
[name] ,   The [facility] Managed Care Team is available to provide assistance to you with your healthcare needs at no cost and as a benefit of your [employer] Health plan. I'm sorry I was unable to reach you today for our scheduled appointment. Our care guide will call you to reschedule our telephone appointment. Please call me at the number below. I am available to be of assistance to you regarding your healthcare needs. .   Thank you,  Dickie La, BSW, MSW, LCSW Managed Medicaid LCSW [facility]  [address] [email or web address] Phone: [phone]

## 2021-07-21 ENCOUNTER — Other Ambulatory Visit: Payer: Self-pay | Admitting: *Deleted

## 2021-07-21 ENCOUNTER — Telehealth: Payer: Self-pay | Admitting: Internal Medicine

## 2021-07-21 DIAGNOSIS — E1152 Type 2 diabetes mellitus with diabetic peripheral angiopathy with gangrene: Secondary | ICD-10-CM | POA: Diagnosis not present

## 2021-07-21 NOTE — Patient Outreach (Signed)
Care Coordination  07/21/2021  COLBIE DANNER December 20, 1955 [id number]   Medicaid Managed Care   Unsuccessful Outreach Note  07/21/2021 Name: GOERGE MOHR MRN: [id number] DOB: 01/04/1956  Referred by: [name], MD Reason for referral : High Risk Managed Medicaid (Unsuccessful RNCM follow up outreach)   An unsuccessful telephone outreach was attempted today. The patient was referred to the case management team for assistance with care management and care coordination.   Follow Up Plan: RNCM was unable to leave a voicemail.  The care management team will reach out to the patient again over the next 14 days.   Estanislado Emms RN, BSN [place]  [facility]

## 2021-07-21 NOTE — Telephone Encounter (Signed)
..   Medicaid Managed Care   Unsuccessful Outreach Note  07/21/2021 Name: Scott Greer MRN: [id number] DOB: 1956/10/03  Referred by: [name], MD Reason for referral : High Risk Managed Medicaid (I called the patient today to get his phone visit with the MM LCSW rescheduled. His phone would not ring. )   An unsuccessful telephone outreach was attempted today. The patient was referred to the case management team for assistance with care management and care coordination.   Follow Up Plan: The care management team will reach out to the patient again over the next 7 days.   Weston Settle Care Guide, High Risk Medicaid Managed Care Embedded Care Coordination [facility]  [facility]

## 2021-07-21 NOTE — Patient Instructions (Signed)
Visit Information  [name]  - as a part of your Medicaid benefit, you are eligible for care management and care coordination services at no cost or copay. I was unable to reach you by phone today but would be happy to help you with your health related needs. Please feel free to call me @ [phone].   A member of the Managed Medicaid care management team will reach out to you again over the next 14 days.   Cana Mignano RN, BSN [place]  [facility] RN Care Coordinator   

## 2021-07-24 ENCOUNTER — Other Ambulatory Visit: Payer: Medicaid Other | Admitting: *Deleted

## 2021-07-24 ENCOUNTER — Other Ambulatory Visit: Payer: Self-pay

## 2021-07-24 ENCOUNTER — Other Ambulatory Visit: Payer: Self-pay | Admitting: Internal Medicine

## 2021-07-24 DIAGNOSIS — E1152 Type 2 diabetes mellitus with diabetic peripheral angiopathy with gangrene: Secondary | ICD-10-CM | POA: Diagnosis not present

## 2021-07-24 DIAGNOSIS — E1121 Type 2 diabetes mellitus with diabetic nephropathy: Secondary | ICD-10-CM

## 2021-07-24 NOTE — Telephone Encounter (Signed)
Requested medication (s) are due for refill today Yes  Requested medication (s) are on the active medication list Yes  Future visit scheduled Yes on 11/17/21  Note to clinic-lab value does not meet protocol requirement. Routing to clinic for review.   Requested Prescriptions  Pending Prescriptions Disp Refills   metFORMIN (GLUCOPHAGE) 500 MG tablet [Pharmacy Med Name: metformin 500 mg tablet] 60 tablet 1    Sig: TAKE ONE TABLET BY MOUTH TWICE DAILY WITH A MEAL     Endocrinology:  Diabetes - Biguanides Failed - 07/24/2021  8:03 AM      Failed - AA eGFR in normal range and within 360 days    GFR calc Af Amer  Date Value Ref Range Status  12/14/2020 57 (L) >59 mL/min/1.73 Final    Comment:    **In accordance with recommendations from the NKF-ASN Task force,**   [facility] is in the process of updating its eGFR calculation to the   2021 CKD-EPI creatinine equation that estimates kidney function   without a race variable.    GFR, Estimated  Date Value Ref Range Status  05/05/2021 >60 >60 mL/min Final    Comment:    (NOTE) Calculated using the CKD-EPI Creatinine Equation (2021)    eGFR  Date Value Ref Range Status  07/18/2021 70 >59 mL/min/1.73 Final          Passed - Cr in normal range and within 360 days    Creatinine, Ser  Date Value Ref Range Status  07/18/2021 1.18 0.76 - 1.27 mg/dL Final          Passed - HBA1C is between 0 and 7.9 and within 180 days    HbA1c, POC (controlled diabetic range)  Date Value Ref Range Status  07/18/2021 6.0 0.0 - 7.0 % Final          Passed - Valid encounter within last 6 months    Recent Outpatient Visits           6 days ago Type 2 diabetes mellitus with other circulatory complications [facility])   [place], Neoma Laming B, MD   3 months ago Type 2 diabetes mellitus with other circulatory complications [facility])   [place] Plumber B, MD   3 months ago Type 2  diabetes mellitus with diabetic nephropathy, without long-term current use of insulin ([place])   [place], Deborah B, MD   1 year ago Type 2 diabetes mellitus with diabetic nephropathy, without long-term current use of insulin ([place])   [place], MD   1 year ago Need for influenza vaccination   [place], RPH-CPP       Future Appointments             In 1 month Persons, [name], [place]   In 2 months [name], [name], MD [place], [facility]   In 3 months Wynetta Emery, Dalbert Batman, MD [place]

## 2021-07-24 NOTE — Patient Outreach (Signed)
Care Coordination  07/24/2021  ABDINASIR SPADAFORE [birth year] [id number]  RNCM returning call to patient. Mr. Nutting left a message on [facility] voicemail for an urgent return call. Mr. Zwahlen is concerned about his housing, his apartment has been sold and he has to find a new place to live by November. He is requesting Jenna's number. He reports that she has been helping him with housing and has helped him complete the application for new housing. RNCM contacted [name], she will reach out to patient on Wednesday to try and help with his housing situation. RNCM explained to Mr. Kicklighter to expect a call from [place] on 9/21. He voiced understanding. RNCM will reach out to Mr. Lowden again at scheduled time 08/01/21 @ 11:30.   Estanislado Emms RN, BSN [place]  [facility]

## 2021-07-25 DIAGNOSIS — E1152 Type 2 diabetes mellitus with diabetic peripheral angiopathy with gangrene: Secondary | ICD-10-CM | POA: Diagnosis not present

## 2021-07-26 ENCOUNTER — Telehealth (HOSPITAL_COMMUNITY): Payer: Self-pay | Admitting: Licensed Clinical Social Worker

## 2021-07-26 DIAGNOSIS — E1152 Type 2 diabetes mellitus with diabetic peripheral angiopathy with gangrene: Secondary | ICD-10-CM | POA: Diagnosis not present

## 2021-07-26 NOTE — Telephone Encounter (Signed)
Pt informed [facility] RN Case Manager that I was working with him to find housing and requested a call.  Have not spoken to pt regarding housing since June as I have been on leave.  At that time I had assisted pt in applying for Pro[employer] Based Voucher program- I check on status and he remains on the waitlist but they would contact him directly if he was up for consideration.  Per [facility] RN Case Manager note pt will have to leave housing by November as his apt complex has changed management.  CSW called pt to discuss and provide updated list of housing in his price range but went straight to VM- left message requesting return call  Will continue to follow and assist as needed  Burna Sis, LCSW Clinical Social Worker [facility] Desk#: [phone] Cell#: [phone]

## 2021-07-26 NOTE — Telephone Encounter (Signed)
Pt returned CSW call to discuss housing assistance.  Pt is awaiting return calls from [employer] and [employer] regarding potential assistance with income based housing.  CSW had assisted in applying for Project Based Voucher program- emailed an employee with that department at [facility] to learn if we could expedite his application due to impending homelessness- awaiting response  CSW also discuss with pt possibility of helping with ALF placement as pt is now bilateral amputee but pt is not agreeable to facility placement at this time.  CSW emailed pt list of housing in his price range for him to start applying for.  CSW will continue to follow and assist as able   Burna Sis, LCSW Clinical Social Worker [facility] Desk#: [phone] Cell#: [phone]

## 2021-07-27 ENCOUNTER — Telehealth (HOSPITAL_COMMUNITY): Payer: Self-pay | Admitting: Licensed Clinical Social Worker

## 2021-07-27 DIAGNOSIS — E1152 Type 2 diabetes mellitus with diabetic peripheral angiopathy with gangrene: Secondary | ICD-10-CM | POA: Diagnosis not present

## 2021-07-28 DIAGNOSIS — E1152 Type 2 diabetes mellitus with diabetic peripheral angiopathy with gangrene: Secondary | ICD-10-CM | POA: Diagnosis not present

## 2021-07-28 NOTE — Telephone Encounter (Signed)
CSW continuing to assist pt with finding housing as able.    CSW spoke with [facility] who provided CSW with number to boarding house owner- unfortunately available space is upstairs which pt could not access at this time.  They also stated that they could potentially help pt apply for the [employer] program- awaiting return call to confirm they can assist with this and provide with pt's information so they can follow up with him directly.  Will continue to follow and assist as needed  Burna Sis, LCSW Clinical Social Worker [facility] Desk#: [phone] Cell#: [phone]

## 2021-07-30 DIAGNOSIS — E1152 Type 2 diabetes mellitus with diabetic peripheral angiopathy with gangrene: Secondary | ICD-10-CM | POA: Diagnosis not present

## 2021-07-31 DIAGNOSIS — E1152 Type 2 diabetes mellitus with diabetic peripheral angiopathy with gangrene: Secondary | ICD-10-CM | POA: Diagnosis not present

## 2021-07-31 NOTE — Telephone Encounter (Signed)
Spoke with Seward Grater at [employer] to inquire about [employer] program.  She will reach out to pt within 1-2 days to complete application- states it takes about 28 days to hear whether or not someone has been accepted and then would depend on apartment availability how quickly they could get placed.  CSW will continue to follow and assist as needed  Burna Sis, LCSW Clinical Social Worker [facility] Desk#: [phone] Cell#: [phone]

## 2021-08-01 ENCOUNTER — Other Ambulatory Visit: Payer: Self-pay | Admitting: *Deleted

## 2021-08-01 ENCOUNTER — Other Ambulatory Visit: Payer: Self-pay

## 2021-08-01 DIAGNOSIS — E1152 Type 2 diabetes mellitus with diabetic peripheral angiopathy with gangrene: Secondary | ICD-10-CM | POA: Diagnosis not present

## 2021-08-01 DIAGNOSIS — Z7689 Persons encountering health services in other specified circumstances: Secondary | ICD-10-CM | POA: Diagnosis not present

## 2021-08-01 NOTE — Patient Outreach (Addendum)
Medicaid Managed Care   Nurse Care Manager Note  08/01/2021 Name:  KELLAN RAFFIELD MRN:  [id number] DOB:  08-02-1956  FABYAN LOUGHMILLER is an 65 y.o. year old male who is a primary patient of [name], MD.  The [facility] Managed Care Coordination team was consulted for assistance with:    DMII  Mr. Featherston was given information about Medicaid Managed Care Coordination team services today. [name] Patient agreed to services and verbal consent obtained.  Engaged with patient by telephone for follow up visit in response to provider referral for case management and/or care coordination services.   Assessments/Interventions:  Review of past medical history, allergies, medications, health status, including review of consultants reports, laboratory and other test data, was performed as part of comprehensive evaluation and provision of chronic care management services.  SDOH (Social Determinants of Health) assessments and interventions performed:  SDOH Interventions    Flowsheet Row Most Recent Value  SDOH Interventions   Housing Interventions Other (Comment)  [Patient working with LCSW, Tammy Sours for housing assistance]  Transportation Interventions Intervention Not Indicated  [Patient using medical transportation currently]       Care Plan  Allergies  Allergen Reactions   Bee Venom Hives, Itching and Swelling    Medications Reviewed Today     Reviewed by [name], RN (Registered Nurse) on 08/01/21 at 1147  Med List Status: <None>   Medication Order Taking? Sig Documenting Provider Last Dose Status Informant  amiodarone (PACERONE) 200 MG tablet [id number]  Take 1 tablet (200 mg total) by mouth daily.  Patient taking differently: Take 200 mg by mouth daily with lunch.   [name], MD  Active Self  aspirin 81 MG chewable tablet [id number] Yes Chew 1 tablet (81 mg total) by mouth daily.  Patient taking differently: Chew 81 mg by mouth daily with breakfast.    [name], MD Taking Active   atorvastatin (LIPITOR) 10 MG tablet [id number] Yes Take 1 tablet (10 mg total) by mouth daily.  Patient taking differently: Take 10 mg by mouth daily with supper.   [name], MD Taking Active Self  BIDIL 20-37.5 MG tablet [id number] Yes TAKE ONE TABLET BY MOUTH EVERY MORNING and TAKE ONE TABLET BY MOUTH AT NOON and TAKE ONE TABLET BY MOUTH EVERY EVENING [name], MD Taking Active   Blood Glucose Monitoring Suppl (TRUE METRIX METER) w/Device Drucie Opitz [id number]  Use as directed [name], MD  Active Other  carvedilol (COREG) 3.125 MG tablet [id number] Yes TAKE ONE TABLET BY MOUTH EVERY MORNING and TAKE ONE TABLET BY MOUTH EVERY EVENING WITH A MEAL [name], MD Taking Active   digoxin (LANOXIN) 0.125 MG tablet [id number] Yes Take 1 tablet (0.125 mg total) by mouth daily.  Patient taking differently: Take 0.125 mg by mouth daily with supper.   [name], MD Taking Active Self  empagliflozin (JARDIANCE) 10 MG TABS tablet [id number] Yes Take 1 tablet (10 mg total) by mouth daily.  Patient taking differently: Take 10 mg by mouth daily with lunch.   [name], MD Taking Active Self  ENTRESTO 24-26 [place] [id number] Yes TAKE ONE TABLET BY MOUTH EVERY MORNING and TAKE ONE TABLET BY MOUTH EVERY EVENING [name], MD Taking Active   ferrous sulfate 325 (65 FE) MG tablet [id number] Yes Take 1 tablet (325 mg total) by mouth daily with breakfast. [name], MD Taking Active Self  gabapentin (NEURONTIN) 300 MG  capsule [id number] Yes TAKE ONE CAPSULE BY MOUTH THREE TIMES DAILY [name], MD Taking Active   glucose blood (TRUE METRIX BLOOD GLUCOSE TEST) test strip [id number]  Use as instructed [name], MD  Active Other  metFORMIN (GLUCOPHAGE) 500 MG tablet [id number] Yes TAKE ONE TABLET BY MOUTH TWICE DAILY WITH A MEAL [name], MD Taking Active   nutrition supplement, [name]) PACK [id number]  Take 1  packet by mouth 2 (two) times daily between meals. Persons, [name], [name]  Active   Oxycodone HCl 10 MG TABS [id number] No Take 1 tablet (10 mg total) by mouth every 6 (six) hours as needed (pain score 7-10).  Patient not taking: Reported on 08/01/2021   Persons, [name], [name] Not Taking Active   potassium chloride SA (KLOR-CON) 20 MEQ tablet [id number] Yes TAKE ONE TABLET BY MOUTH EVERY MORNING [name], MD Taking Active   spironolactone (ALDACTONE) 25 MG tablet [id number] Yes TAKE ONE TABLET BY MOUTH EVERY MORNING [name], MD Taking Active   torsemide (DEMADEX) 20 MG tablet [id number] No Take 1 tablet (20 mg total) by mouth daily.  Patient not taking: Reported on 08/01/2021   [name], MD Not Taking Active Self           Med Note Payton Doughty   Sat Apr 15, 2021  3:32 PM) Pt states he has a bottle for this - not in packaged meds from [place] [id number]  Use as directed [name], MD  Active Other  Vitamin D, Ergocalciferol, (DRISDOL) 1.25 MG (50000 UNIT) CAPS capsule [id number] Yes Take 1 capsule (50,000 Units total) by mouth every 7 (seven) days.  Patient taking differently: Take 50,000 Units by mouth every Sunday.   [name], MD Taking Active   zinc sulfate 220 (50 Zn) MG capsule [id number] No Take 1 capsule (220 mg total) by mouth daily.  Patient not taking: Reported on 08/01/2021   Persons, [name], [name] Not Taking Active             Patient Active Problem List   Diagnosis Date Noted   S/P BKA (below knee amputation) bilateral ([place]) 07/18/2021   Acute hematogenous osteomyelitis of right foot ([place]) 04/26/2021   Gangrene of right foot ([place])    Foot pain, right 04/14/2021   Subacute osteomyelitis, right ankle and foot ([place])    Status post below-knee amputation ([place]) 02/17/2021   Wound dehiscence    Gangrene of toe of left foot ([place])    Cutaneous abscess of left foot    Left foot infection 02/07/2021    Leukocytosis 02/07/2021   Thrombocytosis 02/07/2021   Hyponatremia 02/07/2021   AKI (acute kidney injury) ([place]) 02/07/2021   Hyperglycemia due to diabetes mellitus ([place]) 02/07/2021   Hypoalbuminemia due to protein-calorie malnutrition ([place]) 02/07/2021   Osteomyelitis of second toe of left foot ([place])    New onset of congestive heart failure ([place]) 12/26/2020   CKD (chronic kidney disease) stage 3, GFR 30-59 ml/min (HCC) 48/18/5631   Acute systolic CHF (congestive heart failure) ([place]) 12/26/2020   Anemia, chronic disease 06/22/2020   Amputee, great toe, right ([place]) 06/20/2020   Normocytic anemia 06/20/2020   Erectile dysfunction associated with type 2 diabetes mellitus ([place]) 06/20/2020   Positive for macroalbuminuria 10/24/2019   Amputation of left great toe ([place]) 10/23/2019   Tobacco dependence 10/23/2019   Osteomyelitis of great toe of right foot ([place]) 06/04/2019   Diabetic  foot infection ([place])    Noncompliance    Glaucoma    Hyperlipidemia    Essential hypertension 11/06/2003   Diabetes mellitus ([place]) 11/05/1997    Conditions to be addressed/monitored per PCP order:  DMII  Care Plan : Diabetes Type 2 (Adult)  Updates made by [name], RN since 08/01/2021 12:00 AM     Problem: Disease Progression (Diabetes, Type 2)      Long-Range Goal: Disease Progression Prevented or Minimized   Start Date: 05/22/2021  Expected End Date: 08/21/2021  Recent Progress: On track  Priority: High  Note:   Objective:  Lab Results  Component Value Date   HGBA1C 6.7 (H) 04/15/2021   Lab Results  Component Value Date   CREATININE 1.31 (H) 05/05/2021   CREATININE 1.21 04/28/2021   CREATININE 1.36 (H) 04/27/2021   Lab Results  Component Value Date   EGFR 86 04/07/2021   Current Barriers:  Knowledge Deficits related to basic Diabetes pathophysiology and self care/management-Mr. Cowley had recent right BKA, 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 He reports falling this morning during  transferring chairs. He is waiting to hear from Dr. Wynetta Emery, who is scheduling PT and arranging transportation. This RNCM will follow up next week and assist with scheduling if not scheduled. He reports having all medications delivered and taking as directed. He met with someone from [place] and will start receiving a warm meal within the next two weeks. Patient reports receiving financial assistance rom [facility] to help pay utility bills. He is receiving MOW and trying to cut out sweets from his diet. He needs assistance affording supplies for his home. He was re certified for food benefits.-Update- Mr. Strausbaugh needs to find housing, he has to be out of his current housing by November. He is working with Tammy Sours for assistance with housing. Mr. Goody is not taking all of his medications as directed due to not knowing what all of his medications are for and medications causing constipation. He should get his prosthetics next week and is excited to gain his mobility back. He has cut out sweets and is feeling much better. Film[employer]video [employer] Social Support Does not adhere to provider recommendations re:  Unable to perform ADLs independently Unable to perform IADLs independently Does not contact provider office for questions/concerns Case Manager Clinical Goal(s):  patient will demonstrate improved adherence to prescribed treatment plan for diabetes self care/management as evidenced by: daily monitoring and recording of CBG  adherence to ADA/ carb modified diet adherence to prescribed medication regimen contacting provider for new or worsened symptoms or questions Interventions:  Inter-disciplinary care team collaboration (see longitudinal plan of care) Provided education to patient about basic DM  Advised patient to answer his phone for any calls related to housing assistance Discussed plans with patient for ongoing care management follow up and provided patient with direct contact information  for care management team Reviewed scheduled/upcoming provider appointments including: calling to schedule eye exam with [facility] [phone] Advised patient, providing education and rationale, to check cbg as directed and record, calling PCP for findings outside established parameters.   Review of patient status, including review of consultants reports, relevant laboratory and other test results, and medications completed. Advised patient to call [facility] [phone] for health rewards offered monthly and free phone benefit-discussed this benefit in depth Provided therapeutic listening Collaborated with MM Pharmacist for medication management Self-Care Activities - Self administers oral medications as prescribed - Self administers insulin as prescribed - Attends all scheduled  provider appointments - Checks blood sugars as prescribed and utilize hyper and hypoglycemia protocol as needed - Adheres to prescribed ADA/carb modified Patient Goals: - attend all scheduled appointments:calling to schedule [facility] exam with [facility] [phone] - call to arrange transportation to scheduled appointments - call [facility] [phone] for healthy benefits and benefit for a free phone - check blood sugar at prescribed times - check blood sugar if I feel it is too high or too low - take the blood sugar log to all doctor visits - take the blood sugar meter to all doctor visits Follow Up Plan: Telephone follow up appointment with care management team member scheduled for:08/21/21 @ 9am      Follow Up:  Patient agrees to Care Plan and Follow-up.  Plan: The Managed Medicaid care management team will reach out to the patient again over the next 14 days.  Date/time of next scheduled RN care management/care coordination outreach:  08/21/21 @ [place] RN, BSN [place]  [employer] manager

## 2021-08-01 NOTE — Patient Instructions (Signed)
Visit Information  Mr. Ciccarelli was given information about Medicaid Managed Care team care coordination services as a part of their [facility] Medicaid benefit. [name] verbally consented to engagement with the [facility] Managed Care team.   If you are experiencing a medical emergency, please call 911 or report to your local emergency department or urgent care.   If you have a non-emergency medical problem during routine business hours, please contact your provider's office and ask to speak with a nurse.   For questions related to your [facility] health plan, please call: [phone] or go here:[email or web address]  If you would like to schedule transportation through your [facility] plan, please call the following number at least 2 days in advance of your appointment: [phone].  Call the [place] at [phone], at any time, 24 hours a day, 7 days a week. If you are in danger or need immediate medical attention call 911.  If you would like help to quit smoking, call 1-800-QUIT-NOW 548-652-9972) OR Espaol: 1-855-Djelo-Ya (4-034-742-5956) o para ms informacin haga clic aqu or Text READY to 200-400 to register via text  Mr. Volkert - following are the goals we discussed in your visit today:   Goals Addressed             This Visit's Progress    Monitor and Manage My Blood Sugar-Diabetes Type 2       Timeframe:  Long-Range Goal Priority:  High Start Date:  05/22/21                           Expected End Date:  08/21/21                     Follow Up Date 08/21/2021    - attend all scheduled appointments: calling to schedule eye exam with [facility] [phone] - call to arrange transportation to scheduled appointments - call [facility] [phone] for healthy benefits and benefit for a free phone - check blood sugar at prescribed times - check blood sugar if I feel it is too high or too low - take the blood sugar log  to all doctor visits - take the blood sugar meter to all doctor visits   Why is this important?   Checking your blood sugar at home helps to keep it from getting very high or very low.  Writing the results in a diary or log helps the doctor know how to care for you.  Your blood sugar log should have the time, date and the results.  Also, write down the amount of insulin or other medicine that you take.  Other information, like what you ate, exercise done and how you were feeling, will also be helpful.             Please see education materials related to diabetes and heart failure provided as print materials.   The patient verbalized understanding of instructions provided today and agreed to receive a mailed copy of patient instruction and/or educational materials.  Telephone follow up appointment with Managed Medicaid care management team member scheduled for:08/21/21 @ [place] RN, [place] RN Care Coordinator   Following is a copy of your plan of care:  Patient Care Plan: General Plan of Care (Adult)  Completed 05/02/2021   Problem Identified: Health Promotion or Disease Self-Management (General Plan of Care) Resolved 05/02/2021  Note:   05/02/21-Patient  discharged to SNF placement    Long-Range Goal: Self-Management Plan Developed Completed 05/02/2021  Start Date: 02/17/2021  Expected End Date: 05/19/2021  Recent Progress: On track  Priority: High  Note:   Current Barriers:  Ineffective Self Health Maintenance-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 He has a rolling walker and manual wheelchair, he will begin having meal delivery today for 10 days, he was approved for 14 hours/wk for PCS, [name], EMT is managing/preparing his medications, he is waiting on PT to be scheduled. He is concerned about his finanaces. He will no longer be able to work and is unsure how he can manage all of his bills.-Update-Mr.  Maret has not started PT at home. He is concerned about possible eviction and how to manage his bills. Mr. Bushey is receiving PCS at this time. He is working with [name] for medication management and will have his medicines delivered and packaged to assist with compliance. Unable to independently manage health care related to recent BKA Does not attend all scheduled provider appointments Unable to perform ADLs independently Unable to perform IADLs independently Does not contact provider office for questions/concerns Financial insecurity Currently UNABLE TO independently self manage needs related to chronic health conditions.  Knowledge Deficits related to short term plan for care coordination needs and long term plans for chronic disease management needs Patient discharged to SNF placement Nurse Case Manager Clinical Goal(s):  patient will work with care management team to address care coordination and chronic disease management needs related to recent BKA   Interventions:  Evaluation of current treatment plan related to BKA and patient's adherence to plan as established by provider. Discussed plans with patient for ongoing care management follow up and provided patient with direct contact information for care management team Provided patient with printed educational materials related to diabetic diet Discussed the importance of compliance with diabetic diet and keeping BS at normal levels for good healing Provided therapeutic listening Collaborated with [facility] regarding home PT services-RNCM is waiting for a return call from representative-will reach out to [facility] to follow up Encouraged patient to call today and follow up on resources provided by LCSW and BSW Reviewed upcoming scheduled appointments including 5/17 for wound check and 03/30/21 with PCP and discussed the importance of keeping all appointments Provided the number for [facility] for free phone benefit [phone], contact number  for free medical transportation [phone] Self Care Activities:  Patient will self administer medications as prescribed Patient will attend all scheduled provider appointments Patient will call provider office for new concerns or questions Patient will work with BSW to address care coordination needs and will continue to work with the clinical team to address health care and disease management related needs.   Patient will work with MM Pharmacist, [name] for medication management Patient Goals: -call for [facility] for free phone benefit [phone] - begin a notebook of services in my neighborhood or community - call 211 when I need some help - follow-up on any referrals for help I am given - make a list of family or friends that I can call  - arrange a ride through an agency 1 week before appointment, call [phone] - ask family or friend for a ride - keep a calendar with appointment dates  Follow Up Plan: Telephone follow up appointment with care management team member scheduled for:04/20/21 @ 10:30am     Patient Care Plan: Medication Management     Problem Identified: Health Promotion or Disease Self-Management (General  Plan of Care)      Goal: Medication Management Completed 05/03/2021  Note:   Current Barriers:  Unable to independently monitor therapeutic efficacy Unable to self administer medications as prescribed Does not adhere to prescribed medication regimen Does not maintain contact with provider office Does not contact provider office for questions/concerns   Pharmacist Clinical Goal(s):  Over the next 30 days, patient will contact provider office for questions/concerns as evidenced notation of same in electronic health record through collaboration with PharmD and provider.    Interventions: Inter-disciplinary care team collaboration (see longitudinal plan of care) Comprehensive medication review performed; medication list updated in electronic medical  record    Patient Goals/Self-Care Activities Over the next 30 days, patient will:  - take medications as prescribed  Follow Up Plan: The patient has been provided with contact information for the care management team and has been advised to call with any health related questions or concerns.       Patient Care Plan: LCSW Plan of Care     Problem Identified: Resources Resolved 05/02/2021  Onset Date: 03/15/2021  Note:   SAAD BUHL is a 65 y.o. year old male who sees [name], MD for primary care. The  [facility] Managed Care team was consulted for assistance with rent and utilities. Mr. Imran was given information about Care Management services, agreed to services, and verbal consent for services was obtained.  Interventions:  Patient interviewed and appropriate assessments performed Collaborated with clinical team regarding patient needs  SDOH (Social Determinants of Health) assessments performed: Yes     Provided patient with information about DSS utility assistance program. Patient stated that another social worker has completed the application for him and he has not heard anything back from them. Patient states he completed an application with the [employer] and has not heard from them. Patient stated he would be contacting the Welfare Reform today.  Patient did receive a list of churches from [employer], patient stated he did contact the churches but some he was not able to get through and some did not have any funds. Patient states he is $1300 behind in rent. Patient currently receives foodstamps. BSW contacted [facility] at [phone] and was unable to leave a voicemail. BSW will continue to research for rental assistance programs. Update 03/21/21: BSW received a telephone call from patient stating he thinks he is going to be put out. BSW asked if he has received a letter stating that, patient stated no he has not. BSW  contacted [employer] and rep stated they are taking applications, but they are behind about 14 business days, rep stated applications do have to be completed and submitted online. BSW completed and submited application on behalf of patient.  05/02/21: BSW closing case. Patient has been placed in SNF.  Plan:  Over the next 30-90 days, patient will work with BSW to address needs related to Housing barriers Education officer, museum will follow up in 14 days.   Mickel Fuchs, BSW, [place]  High Risk Managed Medicaid Team  [phone]         Problem Identified: Depression Identification (Depression)      Problem Identified: Depression Identification (Depression)      Long-Range Goal: Depressive Symptoms Identified   Start Date: 05/30/2021  Recent Progress: On track  Priority: High  Note:   Timeframe:  Long-Range Goal Priority:  High Start Date:  05/30/21  Expected End Date:  ongoing                   Follow Up Date 07/20/21  Current Barriers:  Acute Mental Health needs related to stress related to recent right BKA Financial constraints related to housing, Limited social support, ADL IADL limitations, Mental Health Concerns , and Social Isolation Suicidal Ideation/Homicidal Ideation: No  Clinical Social Work Goal(s):  Over the next 120 days, patient will work with SW by telephone to reduce or manage symptoms related to stress patient will work with [name] BSW to address needs related to financial strain patient will demonstrate improved health management independence as evidenced by increasing self-care and implementing healthy coping skills to reeducate stress  Interventions: Patient interviewed and appropriate assessments performed: brief mental health assessment 65 year old male patient with a recent right BKA is having difficulty adjusting to his new way of life and experiencing financial strain. Patient will be  referred to [facility] BSW for community resource support on 05/30/21. 06/19/21-Patient still denies wanting mental health resource implementation at this time. Patient has a strong support network which includes his ex spouse, CNA and neighbors. Patient reports that one of his neighbors' sons will assist him with various task like running errands or taking out his trash which is helpful to him. Patient's CNA comes daily for 2 hours in the morning and his ex spouse checks on him daily as well. Positive reinforcement provided for his current health care management  Patient was educated on available mental health resources but patient declined needing this implementation at this time and will notify [facility] LCSW if this changes. Patient is two months behind in rent at this time and is in need of community resource support. [facility] LCSW will update [facility] BSW on this financial concerns on 06/19/21 Patient is on the wait list for [place] program.  SDOH Interventions    Flowsheet Row Most Recent Value  SDOH Interventions   SDOH Interventions for the Following Domains Depression, Financial Strain, Housing, Stress  Financial Strain Interventions Other (Comment)  [Referral to [facility] BSW]  Housing Interventions Intervention Not Indicated  [Referral to [facility] BSW]  Stress Interventions Provide Counseling  Depression Interventions/Treatment  Patient refuses Treatment      Patient interviewed and appropriate assessments performed Provided patient with information about available mental health resources and healthy coping skills to help reduce stressors Discussed plans with patient for ongoing care management follow up and provided patient with direct contact information for care management team Solution-Focused Strategies, Active listening / Reflection utilized , Emotional Supportive Provided, and Participation in counseling encouraged   Patient Self Care Activities:  Self administers  medications as prescribed Attends all scheduled provider appointments Calls provider office for new concerns or questions Ability for insight Independent living Motivation for treatment Strong family or social support  Patient Coping Strengths:  Supportive Relationships Friends Hopefulness Self Advocate Able to Communicate Effectively  Patient Self Care Deficits:  Lacks social connections  Initial goal documentation     Patient Care Plan: Diabetes Type 2 (Adult)     Problem Identified: Disease Progression (Diabetes, Type 2)      Long-Range Goal: Disease Progression Prevented or Minimized   Start Date: 05/22/2021  Expected End Date: 08/21/2021  Recent Progress: On track  Priority: High  Note:   Objective:  Lab Results  Component Value Date   HGBA1C 6.7 (H) 04/15/2021   Lab Results  Component Value Date   CREATININE 1.31 (  H) 05/05/2021   CREATININE 1.21 04/28/2021   CREATININE 1.36 (H) 04/27/2021   Lab Results  Component Value Date   EGFR 86 04/07/2021   Current Barriers:  Knowledge Deficits related to basic Diabetes pathophysiology and self care/management-Mr. 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 He reports falling this morning during transferring chairs. He is waiting to hear from Dr. Laural Benes, who is scheduling PT and arranging transportation. This RNCM will follow up next week and assist with scheduling if not scheduled. He reports having all medications delivered and taking as directed. He met with someone from [employer] and will start receiving a warm meal within the next two weeks. Patient reports receiving financial assistance rom [facility] to help pay utility bills. He is receiving [employer] and trying to cut out sweets from his diet. He needs assistance affording supplies for his home. He was re certified for food benefits.-Update- Mr. Heinz needs to find housing, he has to be out of his current housing by November. He is working with [name] for  assistance with housing. Mr. Sleeth is not taking all of his medications as directed due to not knowing what all of his medications are for and medications causing constipation. He should get his prosthetics next week and is excited to gain his mobility back. He has cut out sweets and is feeling much better. [employer] Social Support Does not adhere to provider recommendations re:  Unable to perform ADLs independently Unable to perform IADLs independently Does not contact provider office for questions/concerns Case Manager Clinical Goal(s):  patient will demonstrate improved adherence to prescribed treatment plan for diabetes self care/management as evidenced by: daily monitoring and recording of CBG  adherence to ADA/ carb modified diet adherence to prescribed medication regimen contacting provider for new or worsened symptoms or questions Interventions:  Inter-disciplinary care team collaboration (see longitudinal plan of care) Provided education to patient about basic DM  Advised patient to answer his phone for any calls related to housing assistance Discussed plans with patient for ongoing care management follow up and provided patient with direct contact information for care management team Reviewed scheduled/upcoming provider appointments including: calling to schedule eye exam with [facility] [phone] Advised patient, providing education and rationale, to check cbg as directed and record, calling PCP for findings outside established parameters.   Review of patient status, including review of consultants reports, relevant laboratory and other test results, and medications completed. Advised patient to call [facility] [phone] for health rewards offered monthly and free phone benefit-discussed this benefit in depth Provided therapeutic listening Collaborated with MM Pharmacist for medication management Self-Care Activities - Self administers oral medications  as prescribed - Self administers insulin as prescribed - Attends all scheduled provider appointments - Checks blood sugars as prescribed and utilize hyper and hypoglycemia protocol as needed - Adheres to prescribed ADA/carb modified Patient Goals: - attend all scheduled appointments:calling [facility] [phone] - call to arrange transportation to scheduled appointments - call [facility] [phone] for healthy benefits and benefit for a free phone - check blood sugar at prescribed times - check blood sugar if I feel it is too high or too low - take the blood sugar log to all doctor visits - take the blood sugar meter to all doctor visits Follow Up Plan: Telephone follow up appointment with care management team member scheduled for:08/21/21 @ 9am     Patient Care Plan: General Plan of Care (Adult)  Problem Identified: Coping Skills (General Plan of Care)      Goal: Coping Skills Enhanced   Note:   Evidence-based guidance:  Acknowledge, normalize and validate difficulty of making life-long lifestyle changes.  Identify current effective and ineffective coping strategies.  Encourage patient and caregiver participation in care to increase self-esteem, confidence and feelings of control.  Consider alternative and complementary therapy approaches such as meditation, mindfulness or yoga.  Encourage participation in cognitive behavioral therapy to foster a positive identity, increase self-awareness, as well as bolster self-esteem, confidence and self-efficacy.  Discuss spirituality; be present as concerns are identified; encourage journaling, prayer, worship services, meditation or pastoral counseling.  Encourage participation in pleasurable group activities such as hobbies, singing, sports or volunteering).  Encourage the use of mindfulness; refer for training or intensive intervention.  Consider the use of meditative movement therapy such as tai chi,  yoga or qigong.  Promote a regular daily exercise program based on tolerance, ability and patient choice to support positive thinking about disease or aging.   Notes:

## 2021-08-02 ENCOUNTER — Other Ambulatory Visit: Payer: Self-pay | Admitting: Internal Medicine

## 2021-08-02 DIAGNOSIS — E1152 Type 2 diabetes mellitus with diabetic peripheral angiopathy with gangrene: Secondary | ICD-10-CM | POA: Diagnosis not present

## 2021-08-02 DIAGNOSIS — E559 Vitamin D deficiency, unspecified: Secondary | ICD-10-CM

## 2021-08-02 NOTE — Telephone Encounter (Signed)
Requested medication (s) are due for refill today:yes  Requested medication (s) are on the active medication list: yes  Last refill: 03/07/21 #4  2 refills  Future visit scheduled Not with PCP  Notes to clinic: Not delegated  Requested Prescriptions  Pending Prescriptions Disp Refills   Vitamin D, Ergocalciferol, (DRISDOL) 1.25 MG (50000 UNIT) CAPS capsule [Pharmacy Med Name: Vitamin D2 1,250 mcg (50,000 unit) capsule] 4 capsule 0    Sig: TAKE ONE CAPSULE BY MOUTH ONCE WEEKLY ON SUNDAY     Endocrinology:  Vitamins - Vitamin D Supplementation Failed - 08/02/2021  4:12 PM      Failed - 50,000 IU strengths are not delegated      Failed - Vitamin D in normal range and within 360 days    Vit D, 25-Hydroxy  Date Value Ref Range Status  12/14/2020 9.2 (L) 30.0 - 100.0 ng/mL Final    Comment:    Vitamin D deficiency has been defined by the Institute of Medicine and an Endocrine Society practice guideline as a level of serum 25-OH vitamin D less than 20 ng/mL (1,2). The Endocrine Society went on to further define vitamin D insufficiency as a level between 21 and 29 ng/mL (2). 1. IOM (Institute of Medicine). 2010. Dietary reference    intakes for calcium and D. [place]: The    [employer]. 2. Holick MF, Binkley Wilcox, Bischoff-Ferrari HA, et al.    Evaluation, treatment, and prevention of vitamin D    deficiency: an Endocrine Society clinical practice    guideline. JCEM. 2011 Jul; 96(7):1911-30.           Passed - Ca in normal range and within 360 days    Calcium  Date Value Ref Range Status  07/18/2021 10.1 8.6 - 10.2 mg/dL Final   Calcium, Ion  Date Value Ref Range Status  01/02/2021 1.15 1.15 - 1.40 mmol/L Final          Passed - Phosphate in normal range and within 360 days    Phosphorus  Date Value Ref Range Status  02/07/2021 3.2 2.5 - 4.6 mg/dL Final    Comment:    Performed at [facility], [address], [place], [place] [address]           Passed - Valid encounter within last 12 months    Recent Outpatient Visits           2 weeks ago Type 2 diabetes mellitus with other circulatory complications [facility])   [facility] Jonah Blue B, MD   3 months ago Type 2 diabetes mellitus with other circulatory complications [facility])   [facility] Jonah Blue B, MD   4 months ago Type 2 diabetes mellitus with diabetic nephropathy, without long-term current use of insulin (HCC)   [name] Jonah Blue B, MD   1 year ago Type 2 diabetes mellitus with diabetic nephropathy, without long-term current use of insulin (HCC)   [place] [facility] [name], MD   1 year ago Need for influenza vaccination   [facility] Lois Huxley, Cornelius Moras, RPH-CPP       Future Appointments             In 1 month Persons, [name], PA [facility]   In 2 months [name], Deloris Ping, MD [facility]  In 3 months [name], MD [facility]

## 2021-08-03 ENCOUNTER — Other Ambulatory Visit: Payer: Self-pay | Admitting: Physician Assistant

## 2021-08-03 ENCOUNTER — Other Ambulatory Visit: Payer: Self-pay | Admitting: Family

## 2021-08-03 DIAGNOSIS — E1152 Type 2 diabetes mellitus with diabetic peripheral angiopathy with gangrene: Secondary | ICD-10-CM | POA: Diagnosis not present

## 2021-08-03 MED ORDER — OXYCODONE HCL 10 MG PO TABS: 10.0000 mg | ORAL_TABLET | Freq: Three times a day (TID) | ORAL | 0 refills | Status: DC | PRN

## 2021-08-03 NOTE — Telephone Encounter (Signed)
done

## 2021-08-04 DIAGNOSIS — E1152 Type 2 diabetes mellitus with diabetic peripheral angiopathy with gangrene: Secondary | ICD-10-CM | POA: Diagnosis not present

## 2021-08-05 DIAGNOSIS — Z419 Encounter for procedure for purposes other than remedying health state, unspecified: Secondary | ICD-10-CM | POA: Diagnosis not present

## 2021-08-05 DIAGNOSIS — E1152 Type 2 diabetes mellitus with diabetic peripheral angiopathy with gangrene: Secondary | ICD-10-CM | POA: Diagnosis not present

## 2021-08-06 DIAGNOSIS — E1152 Type 2 diabetes mellitus with diabetic peripheral angiopathy with gangrene: Secondary | ICD-10-CM | POA: Diagnosis not present

## 2021-08-07 DIAGNOSIS — E1152 Type 2 diabetes mellitus with diabetic peripheral angiopathy with gangrene: Secondary | ICD-10-CM | POA: Diagnosis not present

## 2021-08-08 DIAGNOSIS — E1152 Type 2 diabetes mellitus with diabetic peripheral angiopathy with gangrene: Secondary | ICD-10-CM | POA: Diagnosis not present

## 2021-08-09 DIAGNOSIS — E1152 Type 2 diabetes mellitus with diabetic peripheral angiopathy with gangrene: Secondary | ICD-10-CM | POA: Diagnosis not present

## 2021-08-10 DIAGNOSIS — E1152 Type 2 diabetes mellitus with diabetic peripheral angiopathy with gangrene: Secondary | ICD-10-CM | POA: Diagnosis not present

## 2021-08-11 ENCOUNTER — Telehealth (HOSPITAL_COMMUNITY): Payer: Self-pay | Admitting: Licensed Clinical Social Worker

## 2021-08-11 DIAGNOSIS — E1152 Type 2 diabetes mellitus with diabetic peripheral angiopathy with gangrene: Secondary | ICD-10-CM | POA: Diagnosis not present

## 2021-08-11 NOTE — Telephone Encounter (Signed)
Received call from [employer] representative, Loraine Leriche, that they will reach out to pt today about applying for [employer] housing- will reach back out to me if they need further information.  [employer] program has a waitlist so unsure how long this process will take- pt aware of this  Will continue to follow and assist as needed  Burna Sis, LCSW Clinical Social Worker [facility] Desk#: [phone] Cell#: [phone]

## 2021-08-13 DIAGNOSIS — E1152 Type 2 diabetes mellitus with diabetic peripheral angiopathy with gangrene: Secondary | ICD-10-CM | POA: Diagnosis not present

## 2021-08-14 DIAGNOSIS — E1152 Type 2 diabetes mellitus with diabetic peripheral angiopathy with gangrene: Secondary | ICD-10-CM | POA: Diagnosis not present

## 2021-08-15 DIAGNOSIS — E1152 Type 2 diabetes mellitus with diabetic peripheral angiopathy with gangrene: Secondary | ICD-10-CM | POA: Diagnosis not present

## 2021-08-16 ENCOUNTER — Telehealth: Payer: Self-pay | Admitting: Internal Medicine

## 2021-08-16 DIAGNOSIS — E1152 Type 2 diabetes mellitus with diabetic peripheral angiopathy with gangrene: Secondary | ICD-10-CM | POA: Diagnosis not present

## 2021-08-16 NOTE — Telephone Encounter (Signed)
Copied from CRM [id number]. Topic: General - Other >> Aug 16, 2021  1:06 PM Gaetana Michaelis A wrote: Reason for CRM: The patient would like to be contacted by staff member Resa Miner when possible  The patient would like to discuss previously shared information related to their appointments with an ophthalmologist and cardiologist   The patient wrote down the dates and times of their appointments that staff shared with them but has misplaced that information   Please contact when possible

## 2021-08-17 DIAGNOSIS — T8130XA Disruption of wound, unspecified, initial encounter: Secondary | ICD-10-CM | POA: Diagnosis not present

## 2021-08-17 DIAGNOSIS — I96 Gangrene, not elsewhere classified: Secondary | ICD-10-CM | POA: Diagnosis not present

## 2021-08-17 DIAGNOSIS — E1152 Type 2 diabetes mellitus with diabetic peripheral angiopathy with gangrene: Secondary | ICD-10-CM | POA: Diagnosis not present

## 2021-08-17 DIAGNOSIS — L02612 Cutaneous abscess of left foot: Secondary | ICD-10-CM | POA: Diagnosis not present

## 2021-08-17 DIAGNOSIS — L089 Local infection of the skin and subcutaneous tissue, unspecified: Secondary | ICD-10-CM | POA: Diagnosis not present

## 2021-08-18 DIAGNOSIS — E1152 Type 2 diabetes mellitus with diabetic peripheral angiopathy with gangrene: Secondary | ICD-10-CM | POA: Diagnosis not present

## 2021-08-18 NOTE — Telephone Encounter (Signed)
Returned pt call and provided appt info for the cardiologist. Informed pt that he will need to call the opthamaologist(Dr. Groat) to get appt info. Pt states he understands and doesn't have any questions or concerns

## 2021-08-20 DIAGNOSIS — E1152 Type 2 diabetes mellitus with diabetic peripheral angiopathy with gangrene: Secondary | ICD-10-CM | POA: Diagnosis not present

## 2021-08-21 ENCOUNTER — Other Ambulatory Visit: Payer: Self-pay | Admitting: *Deleted

## 2021-08-21 DIAGNOSIS — E1152 Type 2 diabetes mellitus with diabetic peripheral angiopathy with gangrene: Secondary | ICD-10-CM | POA: Diagnosis not present

## 2021-08-21 NOTE — Patient Outreach (Signed)
Care Coordination  08/21/2021  Scott Greer Jun 04, 1956 [id number]   Medicaid Managed Care   Unsuccessful Outreach Note  08/21/2021 Name: Scott Greer MRN: [id number] DOB: [birth year]  Referred by: [name], MD Reason for referral : High Risk Managed Medicaid (Unsuccessful RNCM follow up telephone outreach)   An unsuccessful telephone outreach was attempted today. The patient was referred to the case management team for assistance with care management and care coordination.   Follow Up Plan: A HIPAA compliant phone message was left for the patient providing contact information and requesting a return call.   Estanislado Emms RN, BSN [place]  [facility]

## 2021-08-21 NOTE — Patient Instructions (Signed)
Visit Information  [name]  - as a part of your Medicaid benefit, you are eligible for care management and care coordination services at no cost or copay. I was unable to reach you by phone today but would be happy to help you with your health related needs. Please feel free to call me @ [phone].   A member of the Managed Medicaid care management team will reach out to you again over the next 14 days.   Desean Heemstra RN, BSN [place]  [facility] RN Care Coordinator   

## 2021-08-22 DIAGNOSIS — E1152 Type 2 diabetes mellitus with diabetic peripheral angiopathy with gangrene: Secondary | ICD-10-CM | POA: Diagnosis not present

## 2021-08-23 ENCOUNTER — Telehealth: Payer: Self-pay | Admitting: *Deleted

## 2021-08-23 DIAGNOSIS — E1152 Type 2 diabetes mellitus with diabetic peripheral angiopathy with gangrene: Secondary | ICD-10-CM | POA: Diagnosis not present

## 2021-08-23 NOTE — Telephone Encounter (Signed)
Copied from CRM [id number]. Topic: Referral - Status >> Aug 01, 2021  3:56 PM Marylen Ponto wrote: Reason for CRM: Pt stated he received a call from the office asking hi, to contact transportation regarding his appt to the eye doctor but he was not given any information about the appt. Pt also stated he was told he would receive a call back but has yet to receive a call. Cb# [phone]

## 2021-08-23 NOTE — Telephone Encounter (Signed)
Call placed to patient twice # [phone], phone rings fast busy.   This was noted from [facility], [place] 08/18/2021: Returned pt call and provided appt info for the cardiologist. Informed pt that he will need to call the opthamaologist(Dr. Groat) to get appt info. Pt states he understands and doesn't have any questions or concerns

## 2021-08-24 DIAGNOSIS — E1152 Type 2 diabetes mellitus with diabetic peripheral angiopathy with gangrene: Secondary | ICD-10-CM | POA: Diagnosis not present

## 2021-08-25 ENCOUNTER — Other Ambulatory Visit: Payer: Self-pay

## 2021-08-25 DIAGNOSIS — E1152 Type 2 diabetes mellitus with diabetic peripheral angiopathy with gangrene: Secondary | ICD-10-CM | POA: Diagnosis not present

## 2021-08-28 ENCOUNTER — Other Ambulatory Visit: Payer: Self-pay | Admitting: Licensed Clinical Social Worker

## 2021-08-28 ENCOUNTER — Other Ambulatory Visit (HOSPITAL_COMMUNITY): Payer: Self-pay | Admitting: Cardiology

## 2021-08-28 ENCOUNTER — Telehealth: Payer: Self-pay | Admitting: Pharmacist

## 2021-08-28 DIAGNOSIS — Z7689 Persons encountering health services in other specified circumstances: Secondary | ICD-10-CM | POA: Diagnosis not present

## 2021-08-28 DIAGNOSIS — E1152 Type 2 diabetes mellitus with diabetic peripheral angiopathy with gangrene: Secondary | ICD-10-CM | POA: Diagnosis not present

## 2021-08-28 NOTE — Patient Outreach (Signed)
Medicaid Managed Care Social Work Note  08/28/2021 Name:  Scott Greer MRN:  [id number] DOB:  1956/03/27  Scott Greer is an 65 y.o. year old male who is a primary patient of [name], MD.  The Medicaid Managed Care Coordination team was consulted for assistance with:  Mental Health Counseling and Resources  Scott Greer was given information about Medicaid Managed Care Coordination team services today. [name] Patient agreed to services and verbal consent obtained.  Engaged with patient  for by telephone forfollow up visit in response to referral for case management and/or care coordination services.   Assessments/Interventions:  Review of past medical history, allergies, medications, health status, including review of consultants reports, laboratory and other test data, was performed as part of comprehensive evaluation and provision of chronic care management services.  SDOH: (Social Determinant of Health) assessments and interventions performed: SDOH Interventions    Flowsheet Row Most Recent Value  SDOH Interventions   Depression Interventions/Treatment  Patient refuses Treatment       Advanced Directives Status:  See Care Plan for related entries.  Care Plan                 Allergies  Allergen Reactions   Bee Venom Hives, Itching and Swelling    Medications Reviewed Today     Reviewed by [name], RN (Registered Nurse) on 08/01/21 at 1147  Med List Status: <None>   Medication Order Taking? Sig Documenting Provider Last Dose Status Informant  amiodarone (PACERONE) 200 MG tablet [id number]  Take 1 tablet (200 mg total) by mouth daily.  Patient taking differently: Take 200 mg by mouth daily with lunch.   [name], MD  Active Self  aspirin 81 MG chewable tablet [id number] Yes Chew 1 tablet (81 mg total) by mouth daily.  Patient taking differently: Chew 81 mg by mouth daily with breakfast.   [name], MD Taking Active   atorvastatin  (LIPITOR) 10 MG tablet [id number] Yes Take 1 tablet (10 mg total) by mouth daily.  Patient taking differently: Take 10 mg by mouth daily with supper.   [name], MD Taking Active Self  BIDIL 20-37.5 MG tablet [id number] Yes TAKE ONE TABLET BY MOUTH EVERY MORNING and TAKE ONE TABLET BY MOUTH AT NOON and TAKE ONE TABLET BY MOUTH EVERY EVENING [name], MD Taking Active   Blood Glucose Monitoring Suppl (TRUE METRIX METER) w/Device Drucie Opitz [id number]  Use as directed [name], MD  Active Other  carvedilol (COREG) 3.125 MG tablet [id number] Yes TAKE ONE TABLET BY MOUTH EVERY MORNING and TAKE ONE TABLET BY MOUTH EVERY EVENING WITH A MEAL [name], MD Taking Active   digoxin (LANOXIN) 0.125 MG tablet [id number] Yes Take 1 tablet (0.125 mg total) by mouth daily.  Patient taking differently: Take 0.125 mg by mouth daily with supper.   [name], MD Taking Active Self  empagliflozin (JARDIANCE) 10 MG TABS tablet [id number] Yes Take 1 tablet (10 mg total) by mouth daily.  Patient taking differently: Take 10 mg by mouth daily with lunch.   [name], MD Taking Active Self  ENTRESTO 24-26 [place] [id number] Yes TAKE ONE TABLET BY MOUTH EVERY MORNING and TAKE ONE TABLET BY MOUTH EVERY EVENING [name], MD Taking Active   ferrous sulfate 325 (65 FE) MG tablet [id number] Yes Take 1 tablet (325 mg total) by mouth daily with breakfast. [name], MD Taking Active Self  gabapentin (NEURONTIN) 300 MG capsule [id number] Yes TAKE ONE CAPSULE BY MOUTH THREE TIMES DAILY [name], MD Taking Active   glucose blood (TRUE METRIX BLOOD GLUCOSE TEST) test strip [id number]  Use as instructed [name], MD  Active Other  metFORMIN (GLUCOPHAGE) 500 MG tablet [id number] Yes TAKE ONE TABLET BY MOUTH TWICE DAILY WITH A MEAL [name], MD Taking Active   nutrition supplement, [name]) PACK [id number]  Take 1 packet by mouth 2 (two) times daily between meals.  Persons, [name], [name]  Active   Oxycodone HCl 10 MG TABS [id number] No Take 1 tablet (10 mg total) by mouth every 6 (six) hours as needed (pain score 7-10).  Patient not taking: Reported on 08/01/2021   Persons, [name], [name] Not Taking Active   potassium chloride SA (KLOR-CON) 20 MEQ tablet [id number] Yes TAKE ONE TABLET BY MOUTH EVERY MORNING [name], MD Taking Active   spironolactone (ALDACTONE) 25 MG tablet [id number] Yes TAKE ONE TABLET BY MOUTH EVERY MORNING [name], MD Taking Active   torsemide (DEMADEX) 20 MG tablet [id number] No Take 1 tablet (20 mg total) by mouth daily.  Patient not taking: Reported on 08/01/2021   [name], MD Not Taking Active Self           Med Note Payton Doughty   Sat Apr 15, 2021  3:32 PM) Pt states he has a bottle for this - not in packaged meds from [place] [id number]  Use as directed [name], MD  Active Other  Vitamin D, Ergocalciferol, (DRISDOL) 1.25 MG (50000 UNIT) CAPS capsule [id number] Yes Take 1 capsule (50,000 Units total) by mouth every 7 (seven) days.  Patient taking differently: Take 50,000 Units by mouth every Sunday.   [name], MD Taking Active   zinc sulfate 220 (50 Zn) MG capsule [id number] No Take 1 capsule (220 mg total) by mouth daily.  Patient not taking: Reported on 08/01/2021   Persons, [name], [name] Not Taking Active             Patient Active Problem List   Diagnosis Date Noted   S/P BKA (below knee amputation) bilateral ([place]) 07/18/2021   Acute hematogenous osteomyelitis of right foot ([place]) 04/26/2021   Gangrene of right foot ([place])    Foot pain, right 04/14/2021   Subacute osteomyelitis, right ankle and foot ([place])    Status post below-knee amputation ([place]) 02/17/2021   Wound dehiscence    Gangrene of toe of left foot (HCC)    Cutaneous abscess of left foot    Left foot infection 02/07/2021   Leukocytosis 02/07/2021   Thrombocytosis 02/07/2021    Hyponatremia 02/07/2021   AKI (acute kidney injury) ([place]) 02/07/2021   Hyperglycemia due to diabetes mellitus ([place]) 02/07/2021   Hypoalbuminemia due to protein-calorie malnutrition ([place]) 02/07/2021   Osteomyelitis of second toe of left foot ([place])    New onset of congestive heart failure ([place]) 12/26/2020   CKD (chronic kidney disease) stage 3, GFR 30-59 ml/min (HCC) 57/89/7847   Acute systolic CHF (congestive heart failure) ([place]) 12/26/2020   Anemia, chronic disease 06/22/2020   Amputee, great toe, right ([place]) 06/20/2020   Normocytic anemia 06/20/2020   Erectile dysfunction associated with type 2 diabetes mellitus ([place]) 06/20/2020   Positive for macroalbuminuria 10/24/2019   Amputation of left great toe ([place]) 10/23/2019   Tobacco dependence 10/23/2019   Osteomyelitis of great toe of right foot ([place])  06/04/2019   Diabetic foot infection ([place])    Noncompliance    Glaucoma    Hyperlipidemia    Essential hypertension 11/06/2003   Diabetes mellitus ([place]) 11/05/1997    Conditions to be addressed/monitored per PCP order:  Anxiety and Depression  Care Plan : LCSW Plan of Care  Updates made by Greg Cutter, LCSW since 08/28/2021 12:00 AM     Problem: Depression Identification (Depression)      Long-Range Goal: Depressive Symptoms Identified   Start Date: 05/30/2021  Recent Progress: On track  Priority: High  Note:   Timeframe:  Long-Range Goal Priority:  High Start Date:  05/30/21                        Expected End Date:  ongoing                    Current Barriers:  Acute Mental Health needs related to stress related to recent right BKA Financial constraints related to housing, Limited social support, ADL IADL limitations, Mental Health Concerns , and Social Isolation Suicidal Ideation/Homicidal Ideation: No  Clinical Social Work Goal(s):  Over the next 120 days, patient will work with SW by telephone to reduce or manage symptoms related to stress patient will work with  [facility] BSW to address needs related to financial strain patient will demonstrate improved health management independence as evidenced by increasing self-care and implementing healthy coping skills to reeducate stress  Interventions: Patient interviewed and appropriate assessments performed: brief mental health assessment 65 year old male patient with a recent right BKA is having difficulty adjusting to his new way of life and experiencing financial strain. Patient will be referred to [facility] BSW for community resource support on 05/30/21. 06/19/21 and 08/28/21-Patient still denies wanting mental health resource implementation at this time. He reports that he may consider mental health support once he is able to find and settle in a new residence as he is still in need of housing.  Patient has a strong support network which includes his ex spouse, CNA and neighbors. Patient reports that one of his neighbors' sons will assist him with various task like running errands or taking out his trash which is helpful to him. Patient's CNA comes daily for 2 hours in the morning and his ex spouse checks on him daily as well. Positive reinforcement provided for his current health care management  Patient was educated on available mental health resources but patient declined needing this implementation at this time and will notify [facility] LCSW if this changes. Patient is two months behind in rent at this time and is in need of community resource support. [facility] LCSW will update [facility] BSW on this financial concerns on 06/19/21 Patient is on the wait list for [place] program.  Patient was reminded to return missed call from [facility] Pharmacist today on 08/28/21 SDOH Interventions    Flowsheet Row Most Recent Value  SDOH Interventions   SDOH Interventions for the Following Domains Depression, Financial Strain, Housing, Stress  Financial Strain Interventions Other (Comment)  [Referral to [facility] BSW]   Housing Interventions Intervention Not Indicated  [Referral to [facility] BSW]  Stress Interventions Provide Counseling  Depression Interventions/Treatment  Patient refuses Treatment      Patient interviewed and appropriate assessments performed Provided patient with information about available mental health resources and healthy coping skills to help reduce stressors Discussed plans with patient for ongoing care management follow up  and provided patient with direct contact information for care management team Solution-Focused Strategies, Active listening / Reflection utilized , Emotional Supportive Provided, and Participation in counseling encouraged   Patient Self Care Activities:  Self administers medications as prescribed Attends all scheduled provider appointments Calls provider office for new concerns or questions Ability for insight Independent living Motivation for treatment Strong family or social support  Patient Coping Strengths:  Supportive Relationships Friends Hopefulness Self Advocate Able to Communicate Effectively  Patient Self Care Deficits:  Lacks social connections  Please see past updates related to this goal by clicking on the "Past Updates" button in the selected goal       Follow up:  Patient agrees to Care Plan and Follow-up.  Plan: The Managed Medicaid care management team is available to follow up with the patient after provider conversation with the patient regarding recommendation for care management engagement and subsequent re-referral to the care management team.   Eula Fried, BSW, MSW, LCSW Managed Medicaid LCSW [email or web address] Phone: [phone]

## 2021-08-28 NOTE — Patient Instructions (Signed)
Visit Information  [name] was given information about Medicaid Managed Care team care coordination services as a part of their [facility] Medicaid benefit. Scott Greer verbally consented to engagement with the [facility] Managed Care team.   If you are experiencing a medical emergency, please call 911 or report to your local emergency department or urgent care.   If you have a non-emergency medical problem during routine business hours, please contact your provider's office and ask to speak with a nurse.   For questions related to your [facility] health plan, please call: [phone] or go here:[email or web address]  If you would like to schedule transportation through your [facility] plan, please call the following number at least 2 days in advance of your appointment: [phone].  Call the [facility] Crisis Line at [phone], at any time, 24 hours a day, 7 days a week. If you are in danger or need immediate medical attention call 911.  If you would like help to quit smoking, call 1-800-QUIT-NOW (705 398 5643) OR Espaol: 1-855-Djelo-Ya (4-132-440-1027) o para ms informacin haga clic aqu or Text READY to 253-664 to register via text  Scott Greer - following are the goals we discussed in your visit today:   Goals Addressed             This Visit's Progress    Manage My Emotions       Timeframe:  Long-Range Goal Priority:  High Start Date:  05/30/21                        Expected End Date:  ongoing                    - begin personal counseling - call and visit an old friend - check out volunteer opportunities - join a support group - laugh; watch a funny movie or comedian - learn and use visualization or guided imagery - perform a random act of kindness - practice relaxation or meditation daily - start or continue a personal journal - talk about feelings with a friend, family or spiritual advisor - practice positive thinking and  self-talk    Why is this important?   When you are stressed, down or upset, your body reacts too.  For example, your blood pressure may get higher; you may have a headache or stomachache.  When your emotions get the best of you, your body's ability to fight off cold and flu gets weak.  These steps will help you manage your emotions.            Dickie La, BSW, MSW, [employer] Managed Medicaid LCSW [facility]  [email or web address] Phone: [phone]

## 2021-08-28 NOTE — Patient Outreach (Addendum)
08/25/21 Name: Scott Greer MRN: [id number] DOB: [birth year]  Referred by: [name], MD Reason for referral : No chief complaint on file.   An unsuccessful telephone outreach was attempted today. The patient was referred to the case management team for assistance with care management and care coordination.    Follow Up Plan: The Managed Medicaid care management team will reach out to the patient again over the next 10 days.    Cheral Almas PharmD, CPP High Risk Managed Medicaid [place] [phone]

## 2021-08-29 DIAGNOSIS — E1152 Type 2 diabetes mellitus with diabetic peripheral angiopathy with gangrene: Secondary | ICD-10-CM | POA: Diagnosis not present

## 2021-08-30 DIAGNOSIS — E1152 Type 2 diabetes mellitus with diabetic peripheral angiopathy with gangrene: Secondary | ICD-10-CM | POA: Diagnosis not present

## 2021-08-31 ENCOUNTER — Other Ambulatory Visit: Payer: Self-pay | Admitting: Internal Medicine

## 2021-08-31 ENCOUNTER — Other Ambulatory Visit: Payer: Self-pay | Admitting: Physician Assistant

## 2021-08-31 ENCOUNTER — Telehealth (HOSPITAL_COMMUNITY): Payer: Self-pay | Admitting: Licensed Clinical Social Worker

## 2021-08-31 ENCOUNTER — Telehealth: Payer: Self-pay | Admitting: Family

## 2021-08-31 DIAGNOSIS — E559 Vitamin D deficiency, unspecified: Secondary | ICD-10-CM

## 2021-08-31 DIAGNOSIS — E1152 Type 2 diabetes mellitus with diabetic peripheral angiopathy with gangrene: Secondary | ICD-10-CM | POA: Diagnosis not present

## 2021-08-31 NOTE — Telephone Encounter (Signed)
Patient called. He would like to speak with Erin. His call back number is [phone]

## 2021-08-31 NOTE — Telephone Encounter (Signed)
CSW received call from pt to inquire if any more housing options available.   CSW attempted to call [facility] to check on status of [employer] application- left VM  Pt spoke to [employer] where I had helped him apply for project based housing but they state he is still far down the waitlist.  CSW left message for [facility] Medicaid CSW Dickie La to discuss and see what else she might be aware of for this pt.  Pt continues to be against ALF or SNF placement  CSW sending list of more affordable apartments to look into.  Will continue to follow and assist as needed  Burna Sis, LCSW Clinical Social Worker [facility] Desk#: [phone] Cell#: [phone]

## 2021-09-01 ENCOUNTER — Other Ambulatory Visit: Payer: Self-pay | Admitting: Orthopedic Surgery

## 2021-09-01 DIAGNOSIS — E1152 Type 2 diabetes mellitus with diabetic peripheral angiopathy with gangrene: Secondary | ICD-10-CM | POA: Diagnosis not present

## 2021-09-01 MED ORDER — OXYCODONE HCL 10 MG PO TABS: 10.0000 mg | ORAL_TABLET | Freq: Three times a day (TID) | ORAL | 0 refills | Status: DC | PRN

## 2021-09-01 NOTE — Telephone Encounter (Signed)
I called pt to advise that this has been done.  °

## 2021-09-01 NOTE — Telephone Encounter (Signed)
Pt is s/p a right BKA and he states that he is having soreness in his calf muscle since trying on his prosthetic. He is asking for a refill on his pain medication. The pt states that his pharmacy [facility] will deliver the medication to his home and that he is being evicted and his last day will be on Monday 09/04/21. He is asking for [place] to refill the medication today as he will not have an address to deliver medication to after Monday. Please advise.

## 2021-09-01 NOTE — Telephone Encounter (Signed)
Requested medications are due for refill today yes  Requested medications are on the active medication list yes  Last refill 10/4  Last visit 07/18/21  Future visit scheduled 11/17/21  Notes to clinic Not Delegated.  Requested Prescriptions  Pending Prescriptions Disp Refills   Vitamin D, Ergocalciferol, (DRISDOL) 1.25 MG (50000 UNIT) CAPS capsule [Pharmacy Med Name: Vitamin D2 1,250 mcg (50,000 unit) capsule] 4 capsule 0    Sig: TAKE ONE CAPSULE BY MOUTH ONCE WEEKLY ON SUNDAY     Endocrinology:  Vitamins - Vitamin D Supplementation Failed - 08/31/2021 11:18 AM      Failed - 50,000 IU strengths are not delegated      Failed - Vitamin D in normal range and within 360 days    Vit D, 25-Hydroxy  Date Value Ref Range Status  12/14/2020 9.2 (L) 30.0 - 100.0 ng/mL Final    Comment:    Vitamin D deficiency has been defined by the Institute of Medicine and an Endocrine Society practice guideline as a level of serum 25-OH vitamin D less than 20 ng/mL (1,2). The Endocrine Society went on to further define vitamin D insufficiency as a level between 21 and 29 ng/mL (2). 1. IOM (Institute of Medicine). 2010. Dietary reference    intakes for calcium and D. [place]: The    [employer]. 2. Holick MF, Binkley Hernando, Bischoff-Ferrari HA, et al.    Evaluation, treatment, and prevention of vitamin D    deficiency: an Endocrine Society clinical practice    guideline. JCEM. 2011 Jul; 96(7):1911-30.           Passed - Ca in normal range and within 360 days    Calcium  Date Value Ref Range Status  07/18/2021 10.1 8.6 - 10.2 mg/dL Final   Calcium, Ion  Date Value Ref Range Status  01/02/2021 1.15 1.15 - 1.40 mmol/L Final          Passed - Phosphate in normal range and within 360 days    Phosphorus  Date Value Ref Range Status  02/07/2021 3.2 2.5 - 4.6 mg/dL Final    Comment:    Performed at [facility], [address], [place], [place] [address]           Passed - Valid encounter within last 12 months    Recent Outpatient Visits           1 month ago Type 2 diabetes mellitus with other circulatory complications [facility])   [facility] Jonah Blue B, MD   4 months ago Type 2 diabetes mellitus with other circulatory complications [facility])   [facility] Jonah Blue B, MD   5 months ago Type 2 diabetes mellitus with diabetic nephropathy, without long-term current use of insulin (HCC)   [place] [facility] Jonah Blue B, MD   1 year ago Type 2 diabetes mellitus with diabetic nephropathy, without long-term current use of insulin (HCC)   [place] [facility] [name], MD   1 year ago Need for influenza vaccination   [facility] Lois Huxley, Cornelius Moras, RPH-CPP       Future Appointments             In 1 week Adonis Huguenin, NP [facility] [place]   In 1 month [name], Deloris Ping, MD [facility] [place] [address] Office, [facility]   In 2 months [place],  Binnie Rail, MD [facility]

## 2021-09-02 DIAGNOSIS — E1152 Type 2 diabetes mellitus with diabetic peripheral angiopathy with gangrene: Secondary | ICD-10-CM | POA: Diagnosis not present

## 2021-09-04 ENCOUNTER — Other Ambulatory Visit: Payer: Self-pay

## 2021-09-04 ENCOUNTER — Other Ambulatory Visit: Payer: Self-pay | Admitting: *Deleted

## 2021-09-04 DIAGNOSIS — E1152 Type 2 diabetes mellitus with diabetic peripheral angiopathy with gangrene: Secondary | ICD-10-CM | POA: Diagnosis not present

## 2021-09-04 NOTE — Patient Instructions (Signed)
Visit Information  Mr. Rao was given information about Medicaid Managed Care team care coordination services as a part of their [facility] Medicaid benefit. [name] verbally consented to engagement with the [facility] Managed Care team.   If you are experiencing a medical emergency, please call 911 or report to your local emergency department or urgent care.   If you have a non-emergency medical problem during routine business hours, please contact your provider's office and ask to speak with a nurse.   For questions related to your [facility] health plan, please call: [phone] or go here:[email or web address]  If you would like to schedule transportation through your [facility] plan, please call the following number at least 2 days in advance of your appointment: [phone].  Call the [place] at [phone], at any time, 24 hours a day, 7 days a week. If you are in danger or need immediate medical attention call 911.  If you would like help to quit smoking, call 1-800-QUIT-NOW (508)509-6034) OR Espaol: 1-855-Djelo-Ya (1-975-883-2549) o para ms informacin haga clic aqu or Text READY to 200-400 to register via text  Mr. Lewan - following are the goals we discussed in your visit today:   Goals Addressed             This Visit's Progress    COMPLETED: Monitor and Manage My Blood Sugar-Diabetes Type 2       Timeframe:  Long-Range Goal Priority:  High Start Date:  05/22/21                           Expected End Date:  08/21/21                     Follow Up Date 10/04/2021    - attend all scheduled appointments: calling to schedule eye exam with [facility] [phone] - call to arrange transportation to scheduled appointments - call [facility] [phone] for healthy benefits and benefit for a free phone - check blood sugar at prescribed times - check blood sugar if I feel it is too high or too low - take the blood  sugar log to all doctor visits - take the blood sugar meter to all doctor visits   Why is this important?   Checking your blood sugar at home helps to keep it from getting very high or very low.  Writing the results in a diary or log helps the doctor know how to care for you.  Your blood sugar log should have the time, date and the results.  Also, write down the amount of insulin or other medicine that you take.  Other information, like what you ate, exercise done and how you were feeling, will also be helpful.             Please see education materials related to smoking cessation provided as print materials.   The patient verbalized understanding of instructions provided today and agreed to receive a mailed copy of patient instruction and/or educational materials.  Telephone follow up appointment with Managed Medicaid care management team member scheduled for:10/04/21 @ 9:15am  [name] RN, BSN [place] RN Care Coordinator   Following is a copy of your plan of care:  Patient Care Plan: General Plan of Care (Adult)  Completed 05/02/2021   Problem Identified: Health Promotion or Disease Self-Management (General Plan of Care) Resolved 05/02/2021  Note:   05/02/21-Patient discharged  to SNF placement    Long-Range Goal: Self-Management Plan Developed Completed 05/02/2021  Start Date: 02/17/2021  Expected End Date: 05/19/2021  Recent Progress: On track  Priority: High  Note:   Current Barriers:  Ineffective Self Health Maintenance-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 He has a rolling walker and manual wheelchair, he will begin having meal delivery today for 10 days, he was approved for 14 hours/wk for PCS, [name], EMT is managing/preparing his medications, he is waiting on PT to be scheduled. He is concerned about his finanaces. He will no longer be able to work and is unsure how he can manage all of his  bills.-Update-Mr. Salguero has not started PT at home. He is concerned about possible eviction and how to manage his bills. Mr. Moxley is receiving PCS at this time. He is working with [name] for medication management and will have his medicines delivered and packaged to assist with compliance. Unable to independently manage health care related to recent BKA Does not attend all scheduled provider appointments Unable to perform ADLs independently Unable to perform IADLs independently Does not contact provider office for questions/concerns Financial insecurity Currently UNABLE TO independently self manage needs related to chronic health conditions.  Knowledge Deficits related to short term plan for care coordination needs and long term plans for chronic disease management needs Patient discharged to SNF placement Nurse Case Manager Clinical Goal(s):  patient will work with care management team to address care coordination and chronic disease management needs related to recent BKA   Interventions:  Evaluation of current treatment plan related to BKA and patient's adherence to plan as established by provider. Discussed plans with patient for ongoing care management follow up and provided patient with direct contact information for care management team Provided patient with printed educational materials related to diabetic diet Discussed the importance of compliance with diabetic diet and keeping BS at normal levels for good healing Provided therapeutic listening Collaborated with [facility] regarding home PT services-RNCM is waiting for a return call from representative-will reach out to [facility] to follow up Encouraged patient to call today and follow up on resources provided by LCSW and BSW Reviewed upcoming scheduled appointments including 5/17 for wound check and 03/30/21 with PCP and discussed the importance of keeping all appointments Provided the number for [facility] for free phone benefit  [phone], contact number for free medical transportation [phone] Self Care Activities:  Patient will self administer medications as prescribed Patient will attend all scheduled provider appointments Patient will call provider office for new concerns or questions Patient will work with BSW to address care coordination needs and will continue to work with the clinical team to address health care and disease management related needs.   Patient will work with MM Pharmacist, [name] for medication management Patient Goals: -call for [facility] for free phone benefit [phone] - begin a notebook of services in my neighborhood or community - call 211 when I need some help - follow-up on any referrals for help I am given - make a list of family or friends that I can call  - arrange a ride through an agency 1 week before appointment, call [phone] - ask family or friend for a ride - keep a calendar with appointment dates  Follow Up Plan: Telephone follow up appointment with care management team member scheduled for:04/20/21 @ 10:30am     Patient Care Plan: Medication Management     Problem Identified: Health Promotion or Disease Self-Management (General Plan  of Care)      Goal: Medication Management Completed 05/03/2021  Note:   Current Barriers:  Unable to independently monitor therapeutic efficacy Unable to self administer medications as prescribed Does not adhere to prescribed medication regimen Does not maintain contact with provider office Does not contact provider office for questions/concerns   Pharmacist Clinical Goal(s):  Over the next 30 days, patient will contact provider office for questions/concerns as evidenced notation of same in electronic health record through collaboration with PharmD and provider.    Interventions: Inter-disciplinary care team collaboration (see longitudinal plan of care) Comprehensive medication review performed; medication list  updated in electronic medical record    Patient Goals/Self-Care Activities Over the next 30 days, patient will:  - take medications as prescribed  Follow Up Plan: The patient has been provided with contact information for the care management team and has been advised to call with any health related questions or concerns.       Patient Care Plan: LCSW Plan of Care     Problem Identified: Resources Resolved 05/02/2021  Onset Date: 03/15/2021  Note:   LONDON TARNOWSKI is a 65 y.o. year old male who sees [name], MD for primary care. The  [facility] Managed Care team was consulted for assistance with rent and utilities. Mr. Santagata was given information about Care Management services, agreed to services, and verbal consent for services was obtained.  Interventions:  Patient interviewed and appropriate assessments performed Collaborated with clinical team regarding patient needs  SDOH (Social Determinants of Health) assessments performed: Yes     Provided patient with information about DSS utility assistance program. Patient stated that another social worker has completed the application for him and he has not heard anything back from them. Patient states he completed an application with the [employer] and has not heard from them. Patient stated he would be contacting the Welfare Reform today.  Patient did receive a list of churches from [employer], patient stated he did contact the churches but some he was not able to get through and some did not have any funds. Patient states he is $1300 behind in rent. Patient currently receives foodstamps. BSW contacted [facility] at [phone] and was unable to leave a voicemail. BSW will continue to research for rental assistance programs. Update 03/21/21: BSW received a telephone call from patient stating he thinks he is going to be put out. BSW asked if he has received a letter stating that, patient  stated no he has not. BSW contacted [employer] and rep stated they are taking applications, but they are behind about 14 business days, rep stated applications do have to be completed and submitted online. BSW completed and submited application on behalf of patient.  05/02/21: BSW closing case. Patient has been placed in SNF.  Plan:  Over the next 30-90 days, patient will work with BSW to address needs related to Housing barriers Education officer, museum will follow up in 14 days.   Mickel Fuchs, BSW, [place]  High Risk Managed Medicaid Team  [phone]         Problem Identified: Depression Identification (Depression)      Problem Identified: Depression Identification (Depression)      Long-Range Goal: Depressive Symptoms Identified   Start Date: 05/30/2021  Recent Progress: On track  Priority: High  Note:   Timeframe:  Long-Range Goal Priority:  High Start Date:  05/30/21  Expected End Date:  ongoing                    Current Barriers:  Acute Mental Health needs related to stress related to recent right BKA Financial constraints related to housing, Limited social support, ADL IADL limitations, Mental Health Concerns , and Social Isolation Suicidal Ideation/Homicidal Ideation: No  Clinical Social Work Goal(s):  Over the next 120 days, patient will work with SW by telephone to reduce or manage symptoms related to stress patient will work with [facility] BSW to address needs related to financial strain patient will demonstrate improved health management independence as evidenced by increasing self-care and implementing healthy coping skills to reeducate stress  Interventions: Patient interviewed and appropriate assessments performed: brief mental health assessment 65 year old male patient with a recent right BKA is having difficulty adjusting to his new way of life and experiencing financial strain. Patient will be  referred to [facility] BSW for community resource support on 05/30/21. 06/19/21 and 08/28/21-Patient still denies wanting mental health resource implementation at this time. He reports that he may consider mental health support once he is able to find and settle in a new residence as he is still in need of housing.  Patient has a strong support network which includes his ex spouse, CNA and neighbors. Patient reports that one of his neighbors' sons will assist him with various task like running errands or taking out his trash which is helpful to him. Patient's CNA comes daily for 2 hours in the morning and his ex spouse checks on him daily as well. Positive reinforcement provided for his current health care management  Patient was educated on available mental health resources but patient declined needing this implementation at this time and will notify [facility] LCSW if this changes. Patient is two months behind in rent at this time and is in need of community resource support. [facility] LCSW will update [facility] BSW on this financial concerns on 06/19/21 Patient is on the wait list for [facility] program.  Patient was reminded to return missed call from [facility] Pharmacist today on 08/28/21 SDOH Interventions    Flowsheet Row Most Recent Value  SDOH Interventions   SDOH Interventions for the Following Domains Depression, Financial Strain, Housing, Stress  Financial Strain Interventions Other (Comment)  [Referral to [facility] BSW]  Housing Interventions Intervention Not Indicated  [Referral to [facility] BSW]  Stress Interventions Provide Counseling  Depression Interventions/Treatment  Patient refuses Treatment      Patient interviewed and appropriate assessments performed Provided patient with information about available mental health resources and healthy coping skills to help reduce stressors Discussed plans with patient for ongoing care management follow up and provided patient with direct contact  information for care management team Solution-Focused Strategies, Active listening / Reflection utilized , Emotional Supportive Provided, and Participation in counseling encouraged   Patient Self Care Activities:  Self administers medications as prescribed Attends all scheduled provider appointments Calls provider office for new concerns or questions Ability for insight Independent living Motivation for treatment Strong family or social support  Patient Coping Strengths:  Supportive Relationships Friends Hopefulness Self Advocate Able to Communicate Effectively  Patient Self Care Deficits:  Lacks social connections  Please see past updates related to this goal by clicking on the "Past Updates" button in the selected goal      Patient Care Plan: Diabetes Type 2 (Adult)  Completed 09/04/2021   Problem Identified: Disease Progression (Diabetes, Type 2) Resolved  09/04/2021     Long-Range Goal: Disease Progression Prevented or Minimized Completed 09/04/2021  Start Date: 05/22/2021  Expected End Date: 08/21/2021  Recent Progress: On track  Priority: High  Note:   Objective:  Lab Results  Component Value Date   HGBA1C 6.0 07/18/2021   Lab Results  Component Value Date   CREATININE 1.18 07/18/2021   CREATININE 1.31 (H) 05/05/2021   CREATININE 1.21 04/28/2021   Lab Results  Component Value Date   EGFR 70 07/18/2021   Current Barriers:  Knowledge Deficits related to basic Diabetes pathophysiology and self care/management-Mr. 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 He reports falling this morning during transferring chairs. He is waiting to hear from Dr. Wynetta Emery, who is scheduling PT and arranging transportation. This RNCM will follow up next week and assist with scheduling if not scheduled. He reports having all medications delivered and taking as directed. He met with someone from [place] and will start receiving a warm meal within the next two weeks. Patient  reports receiving financial assistance rom [facility] to help pay utility bills. He is receiving MOW and trying to cut out sweets from his diet. He needs assistance affording supplies for his home. He was re certified for food benefits. Mr. Remer needs to find housing, he has to be out of his current housing by November. He is working with Tammy Sours for assistance with housing. Mr. Sheahan is not taking all of his medications as directed due to not knowing what all of his medications are for and medications causing constipation. He should get his prosthetics next week and is excited to gain his mobility back. He has cut out sweets and is feeling much better.-Update-Patient is meeting with Disability Advocacy today for [place] [address]. He will get his prosthetics tomorrow and will call to schedule PT. He has an eye exam on 11/4. Recent A1C 6.0 Financial Constraints Limited Social Support Does not adhere to provider recommendations re:  Unable to perform ADLs independently Unable to perform IADLs independently Does not contact provider office for questions/concerns Case Manager Clinical Goal(s):  patient will demonstrate improved adherence to prescribed treatment plan for diabetes self care/management as evidenced by: daily monitoring and recording of CBG  adherence to ADA/ carb modified diet adherence to prescribed medication regimen contacting provider for new or worsened symptoms or questions-[place] Interventions:  Inter-disciplinary care team collaboration (see longitudinal plan of care) Provided education to patient about basic DM  Advised patient to answer his phone for any calls related to housing assistance Discussed plans with patient for ongoing care management follow up and provided patient with direct contact information for care management team Reviewed scheduled/upcoming provider appointments including: calling to schedule eye exam with [facility]  [phone] Advised patient, providing education and rationale, to check cbg as directed and record, calling PCP for findings outside established parameters.   Review of patient status, including review of consultants reports, relevant laboratory and other test results, and medications completed. Advised patient to call [facility] [phone] for health rewards offered monthly and free phone benefit-discussed this benefit in depth Provided therapeutic listening Collaborated with MM Pharmacist for medication management Self-Care Activities - Self administers oral medications as prescribed - Self administers insulin as prescribed - Attends all scheduled provider appointments - Checks blood sugars as prescribed and utilize hyper and hypoglycemia protocol as needed - Adheres to prescribed ADA/carb modified Patient Goals: - attend all scheduled appointments:calling to schedule eye  exam with [facility] [phone] - call to arrange transportation to scheduled appointments - call [facility] [phone] for healthy benefits and benefit for a free phone - check blood sugar at prescribed times - check blood sugar if I feel it is too high or too low - take the blood sugar log to all doctor visits - take the blood sugar meter to all doctor visits Follow Up Plan: Telephone follow up appointment with care management team member scheduled for:10/04/21 @ 9:15am     Patient Care Plan: General Plan of Care (Adult)     Problem Identified: Coping Skills (General Plan of Care)      Goal: Coping Skills Enhanced   Note:   Evidence-based guidance:  Acknowledge, normalize and validate difficulty of making life-long lifestyle changes.  Identify current effective and ineffective coping strategies.  Encourage patient and caregiver participation in care to increase self-esteem, confidence and feelings of control.  Consider alternative and complementary therapy approaches such as meditation,  mindfulness or yoga.  Encourage participation in cognitive behavioral therapy to foster a positive identity, increase self-awareness, as well as bolster self-esteem, confidence and self-efficacy.  Discuss spirituality; be present as concerns are identified; encourage journaling, prayer, worship services, meditation or pastoral counseling.  Encourage participation in pleasurable group activities such as hobbies, singing, sports or volunteering).  Encourage the use of mindfulness; refer for training or intensive intervention.  Consider the use of meditative movement therapy such as tai chi, yoga or qigong.  Promote a regular daily exercise program based on tolerance, ability and patient choice to support positive thinking about disease or aging.   Notes:

## 2021-09-04 NOTE — Patient Outreach (Signed)
Medicaid Managed Care   Nurse Care Manager Note  09/04/2021 Name:  Scott Greer MRN:  [id number] DOB:  01-Mar-1956  Scott Greer is an 65 y.o. year old male who is a primary patient of [name], MD.  The [facility] Managed Care Coordination team was consulted for assistance with:    DMII  Scott Greer was given information about Medicaid Managed Care Coordination team services today. Scott Greer Patient agreed to services and verbal consent obtained.  Engaged with patient by telephone for follow up visit in response to provider referral for case management and/or care coordination services.   Assessments/Interventions:  Review of past medical history, allergies, medications, health status, including review of consultants reports, laboratory and other test data, was performed as part of comprehensive evaluation and provision of chronic care management services.  SDOH (Social Determinants of Health) assessments and interventions performed: SDOH Interventions    Flowsheet Row Most Recent Value  SDOH Interventions   Food Insecurity Interventions Intervention Not Indicated  [recieving food stamps]       Care Plan  Allergies  Allergen Reactions   Bee Venom Hives, Itching and Swelling    Medications Reviewed Today     RNCM unable to review medications today. Scott Greer did not have his medications with him.        Patient Active Problem List   Diagnosis Date Noted   S/P BKA (below knee amputation) bilateral ([place]) 07/18/2021   Acute hematogenous osteomyelitis of right foot ([place]) 04/26/2021   Gangrene of right foot (HCC)    Foot pain, right 04/14/2021   Subacute osteomyelitis, right ankle and foot ([place])    Status post below-knee amputation ([place]) 02/17/2021   Wound dehiscence    Gangrene of toe of left foot ([place])    Cutaneous abscess of left foot    Left foot infection 02/07/2021   Leukocytosis 02/07/2021   Thrombocytosis 02/07/2021   Hyponatremia 02/07/2021    AKI (acute kidney injury) ([place]) 02/07/2021   Hyperglycemia due to diabetes mellitus ([place]) 02/07/2021   Hypoalbuminemia due to protein-calorie malnutrition ([place]) 02/07/2021   Osteomyelitis of second toe of left foot ([place])    New onset of congestive heart failure ([place]) 12/26/2020   CKD (chronic kidney disease) stage 3, GFR 30-59 ml/min (HCC) 37/90/2409   Acute systolic CHF (congestive heart failure) ([place]) 12/26/2020   Anemia, chronic disease 06/22/2020   Amputee, great toe, right ([place]) 06/20/2020   Normocytic anemia 06/20/2020   Erectile dysfunction associated with type 2 diabetes mellitus ([place]) 06/20/2020   Positive for macroalbuminuria 10/24/2019   Amputation of left great toe ([place]) 10/23/2019   Tobacco dependence 10/23/2019   Osteomyelitis of great toe of right foot ([place]) 06/04/2019   Diabetic foot infection ([place])    Noncompliance    Glaucoma    Hyperlipidemia    Essential hypertension 11/06/2003   Diabetes mellitus ([place]) 11/05/1997    Conditions to be addressed/monitored per PCP order:  DMII  Care Plan : Diabetes Type 2 (Adult)  Updates made by [name], RN since 09/04/2021 12:00 AM  Completed 09/04/2021   Problem: Disease Progression (Diabetes, Type 2) Resolved 09/04/2021     Long-Range Goal: Disease Progression Prevented or Minimized Completed 09/04/2021  Start Date: 05/22/2021  Expected End Date: 08/21/2021  Recent Progress: On track  Priority: High  Note:   Objective:  Lab Results  Component Value Date   HGBA1C 6.0 07/18/2021   Lab Results  Component Value Date   CREATININE 1.18  07/18/2021   CREATININE 1.31 (H) 05/05/2021   CREATININE 1.21 04/28/2021   Lab Results  Component Value Date   EGFR 70 07/18/2021   Current Barriers:  Knowledge Deficits related to basic Diabetes pathophysiology and self care/management-[name] had recent right BKA, 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 He reports falling this morning during transferring chairs. He is waiting to  hear from Dr. Wynetta Emery, who is scheduling PT and arranging transportation. This RNCM will follow up next week and assist with scheduling if not scheduled. He reports having all medications delivered and taking as directed. He met with someone from [place] and will start receiving a warm meal within the next two weeks. Patient reports receiving financial assistance rom [facility] to help pay utility bills. He is receiving MOW and trying to cut out sweets from his diet. He needs assistance affording supplies for his home. He was re certified for food benefits. Scott Greer needs to find housing, he has to be out of his current housing by November. He is working with Scott Greer for assistance with housing. Scott Greer is not taking all of his medications as directed due to not knowing what all of his medications are for and medications causing constipation. He should get his prosthetics next week and is excited to gain his mobility back. He has cut out sweets and is feeling much better.-Update-Patient is meeting with [employer] today for possible placement in the [place] on [facility]. He will get his prosthetics tomorrow and will call to schedule PT. He has an eye exam on 11/4. Recent A1C 6.0 Financial Constraints Limited Social Support Does not adhere to provider recommendations re:  Unable to perform ADLs independently Unable to perform IADLs independently Does not contact provider office for questions/concerns Case Manager Clinical Goal(s):  patient will demonstrate improved adherence to prescribed treatment plan for diabetes self care/management as evidenced by: daily monitoring and recording of CBG  adherence to ADA/ carb modified diet adherence to prescribed medication regimen contacting provider for new or worsened symptoms or questions-Met Interventions:  Inter-disciplinary care team collaboration (see longitudinal plan of care) Provided education to patient about basic DM  Advised patient to  answer his phone for any calls related to housing assistance Discussed plans with patient for ongoing care management follow up and provided patient with direct contact information for care management team Reviewed scheduled/upcoming provider appointments including: calling to schedule eye exam with [facility] [phone] Advised patient, providing education and rationale, to check cbg as directed and record, calling PCP for findings outside established parameters.   Review of patient status, including review of consultants reports, relevant laboratory and other test results, and medications completed. Advised patient to call [facility] [phone] for health rewards offered monthly and free phone benefit-discussed this benefit in depth Provided therapeutic listening Collaborated with MM Pharmacist for medication management Self-Care Activities - Self administers oral medications as prescribed - Self administers insulin as prescribed - Attends all scheduled provider appointments - Checks blood sugars as prescribed and utilize hyper and hypoglycemia protocol as needed - Adheres to prescribed ADA/carb modified Patient Goals: - attend all scheduled appointments:calling to schedule eye exam with [facility] [phone] - call to arrange transportation to scheduled appointments - call [facility] [phone] for healthy benefits and benefit for a free phone - check blood sugar at prescribed times - check blood sugar if I feel it is too high or too low - take the blood sugar log to all doctor visits - take  the blood sugar meter to all doctor visits Follow Up Plan: Telephone follow up appointment with care management team member scheduled for:10/04/21 @ 9:15am      Follow Up:  Patient agrees to Care Plan and Follow-up.  Plan: The Managed Medicaid care management team will reach out to the patient again over the next 30 days.  Date/time of next scheduled RN care management/care  coordination outreach:  10/04/21 @ 9:15am  [name] RN, BSN [place]  [employer] manager

## 2021-09-05 DIAGNOSIS — E1152 Type 2 diabetes mellitus with diabetic peripheral angiopathy with gangrene: Secondary | ICD-10-CM | POA: Diagnosis not present

## 2021-09-05 DIAGNOSIS — Z419 Encounter for procedure for purposes other than remedying health state, unspecified: Secondary | ICD-10-CM | POA: Diagnosis not present

## 2021-09-06 DIAGNOSIS — E1152 Type 2 diabetes mellitus with diabetic peripheral angiopathy with gangrene: Secondary | ICD-10-CM | POA: Diagnosis not present

## 2021-09-07 DIAGNOSIS — E1152 Type 2 diabetes mellitus with diabetic peripheral angiopathy with gangrene: Secondary | ICD-10-CM | POA: Diagnosis not present

## 2021-09-07 DIAGNOSIS — Z7689 Persons encountering health services in other specified circumstances: Secondary | ICD-10-CM | POA: Diagnosis not present

## 2021-09-07 DIAGNOSIS — Z89511 Acquired absence of right leg below knee: Secondary | ICD-10-CM | POA: Diagnosis not present

## 2021-09-07 DIAGNOSIS — Z89512 Acquired absence of left leg below knee: Secondary | ICD-10-CM | POA: Diagnosis not present

## 2021-09-08 DIAGNOSIS — E1152 Type 2 diabetes mellitus with diabetic peripheral angiopathy with gangrene: Secondary | ICD-10-CM | POA: Diagnosis not present

## 2021-09-09 DIAGNOSIS — E1152 Type 2 diabetes mellitus with diabetic peripheral angiopathy with gangrene: Secondary | ICD-10-CM | POA: Diagnosis not present

## 2021-09-10 DIAGNOSIS — E1152 Type 2 diabetes mellitus with diabetic peripheral angiopathy with gangrene: Secondary | ICD-10-CM | POA: Diagnosis not present

## 2021-09-11 ENCOUNTER — Telehealth (HOSPITAL_COMMUNITY): Payer: Self-pay | Admitting: Licensed Clinical Social Worker

## 2021-09-11 DIAGNOSIS — E1152 Type 2 diabetes mellitus with diabetic peripheral angiopathy with gangrene: Secondary | ICD-10-CM | POA: Diagnosis not present

## 2021-09-11 NOTE — Telephone Encounter (Signed)
CSW received call from [place] with [employer]- he has started application for pt for [employer] [facility] but pt did not have all necessary supporting documents- he will call pt today and help him to contact [employer] to get benefit statement.  CSW will continue to follow and assist as needed  Burna Sis, LCSW Clinical Social Worker [facility] Desk#: [phone] Cell#: [phone]

## 2021-09-12 ENCOUNTER — Telehealth: Payer: Self-pay

## 2021-09-12 ENCOUNTER — Other Ambulatory Visit: Payer: Self-pay

## 2021-09-12 DIAGNOSIS — E1152 Type 2 diabetes mellitus with diabetic peripheral angiopathy with gangrene: Secondary | ICD-10-CM | POA: Diagnosis not present

## 2021-09-12 DIAGNOSIS — Z89511 Acquired absence of right leg below knee: Secondary | ICD-10-CM

## 2021-09-12 NOTE — Telephone Encounter (Signed)
Pt called asking if he can have a referral sent to the rehab. He stated he just received his new prosthetic.

## 2021-09-13 ENCOUNTER — Ambulatory Visit: Payer: Medicaid Other | Admitting: Family

## 2021-09-13 DIAGNOSIS — E1152 Type 2 diabetes mellitus with diabetic peripheral angiopathy with gangrene: Secondary | ICD-10-CM | POA: Diagnosis not present

## 2021-09-13 NOTE — Telephone Encounter (Signed)
I called and sw pt and he was not aware that he had an appt today. I called transportation and he had not arranged this. I cx appt for today and resch for Friday for eval advised the pt that he must call to set up his ride. We will write down all information for therapy so he can sch his appt and also sch transportation for this visit as well. Will arrange all this at his visit Friday.

## 2021-09-14 DIAGNOSIS — E1152 Type 2 diabetes mellitus with diabetic peripheral angiopathy with gangrene: Secondary | ICD-10-CM | POA: Diagnosis not present

## 2021-09-15 ENCOUNTER — Ambulatory Visit (INDEPENDENT_AMBULATORY_CARE_PROVIDER_SITE_OTHER): Payer: Medicaid Other | Admitting: Family

## 2021-09-15 ENCOUNTER — Encounter: Payer: Self-pay | Admitting: Family

## 2021-09-15 ENCOUNTER — Telehealth: Payer: Self-pay | Admitting: Family

## 2021-09-15 DIAGNOSIS — Z89511 Acquired absence of right leg below knee: Secondary | ICD-10-CM | POA: Diagnosis not present

## 2021-09-15 DIAGNOSIS — E1152 Type 2 diabetes mellitus with diabetic peripheral angiopathy with gangrene: Secondary | ICD-10-CM | POA: Diagnosis not present

## 2021-09-15 DIAGNOSIS — Z89512 Acquired absence of left leg below knee: Secondary | ICD-10-CM | POA: Diagnosis not present

## 2021-09-15 MED ORDER — OXYCODONE HCL 5 MG PO CAPS: 5.0000 mg | ORAL_CAPSULE | Freq: Four times a day (QID) | ORAL | 0 refills | Status: DC | PRN

## 2021-09-15 NOTE — Progress Notes (Signed)
Office Visit Note   Patient: Scott Greer           Date of Birth: 10/13/1956           MRN: [id number] Visit Date: 09/15/2021              Requested by: [name], MD [address] [place],  [place] [address] PCP: [name], MD  Chief Complaint  Patient presents with   Right Leg - Follow-up    Hx Right BKA 04/26/21      HPI: The patient is a 65 year old gentleman who is seen status post right below-knee amputation June 22 of this year.  He is also status post left below-knee amputation and reports that he has been set up with prostheses for both limbs.  He is seen today for concern of painful prosthetic wear.  He currently is working with [name] at [facility] is advised that he will will need to begin wearing them a little bit at a time gradually begin weightbearing the patient is frustrated with his lack of improvement.  He relates that he has had modifications to his socket once with [name].  He is to begin gait training next Wednesday with physical therapy   Complaining of significant pain from end bearing  Assessment & Plan: Visit Diagnoses: No diagnosis found.  Plan: States he has his first physical therapy appointment next week for proper gait training.  Would like to get [employer] opinion fit of his prostheses and his pain issues.  Possible that he would benefit from modification to his sockets to offload the anterior tibia  Follow-Up Instructions: No follow-ups on file.   Ortho Exam  Patient is alert, oriented, no adenopathy, well-dressed, normal affect, normal respiratory effort. On examination bilateral residual limbs things are well consolidated well-healed there is no ulceration no callus buildup no sign of infection no impending ulceration  Imaging: No results found. No images are attached to the encounter.  Labs: Lab Results  Component Value Date   HGBA1C 6.0 07/18/2021   HGBA1C 6.7 (H) 04/15/2021   HGBA1C 9.1 (H) 02/08/2021    ESRSEDRATE 78 (H) 06/04/2019   CRP 4.5 (H) 06/04/2019   REPTSTATUS 02/11/2021 FINAL 02/06/2021   REPTSTATUS 02/11/2021 FINAL 02/06/2021   CULT  02/06/2021    NO GROWTH 5 DAYS Performed at [facility], [place] [address] [place], [place] [address]    CULT  02/06/2021    NO GROWTH 5 DAYS Performed at [place] [address] [place], [place] [address]      Lab Results  Component Value Date   ALBUMIN 2.8 (L) 05/05/2021   ALBUMIN 3.8 04/07/2021   ALBUMIN 2.3 (L) 02/07/2021   PREALBUMIN 13.8 (L) 06/04/2019    Lab Results  Component Value Date   MG 1.8 02/07/2021   MG 2.0 01/05/2021   MG 2.1 01/04/2021   Lab Results  Component Value Date   VD25OH 9.2 (L) 12/14/2020    Lab Results  Component Value Date   PREALBUMIN 13.8 (L) 06/04/2019   CBC EXTENDED Latest Ref Rng & Units 07/18/2021 05/05/2021 04/28/2021  WBC 3.4 - 10.8 x10E3/uL 7.6 12.8(H) 14.4(H)  RBC 4.14 - 5.80 x10E6/uL 4.12(L) 2.67(L) 2.91(L)  HGB 13.0 - 17.7 g/dL 11.6(L) 7.5(L) 8.0(L)  HCT 37.5 - 51.0 % 34.0(L) 23.5(L) 25.3(L)  PLT 150 - 450 x10E3/uL 320 619(H) 575(H)  NEUTROABS 1.7 - 7.7 K/uL - - -  LYMPHSABS 0.7 - 4.0 K/uL - - -  There is no height or weight on file to calculate BMI.  Orders:  No orders of the defined types were placed in this encounter.  Meds ordered this encounter  Medications   oxycodone (OXY-IR) 5 MG capsule    Sig: Take 1 capsule (5 mg total) by mouth every 6 (six) hours as needed.    Dispense:  30 capsule    Refill:  0     Procedures: No procedures performed  Clinical Data: No additional findings.  ROS:  All other systems negative, except as noted in the HPI. Review of Systems  Constitutional:  Negative for chills and fever.  Cardiovascular:  Negative for leg swelling.  Skin:  Negative for color change and wound.   Objective: Vital Signs: There were no vitals taken for this visit.  Specialty Comments:  No specialty comments available.  PMFS  History: Patient Active Problem List   Diagnosis Date Noted   S/P BKA (below knee amputation) bilateral (HCC) 07/18/2021   Acute hematogenous osteomyelitis of right foot (HCC) 04/26/2021   Gangrene of right foot (HCC)    Foot pain, right 04/14/2021   Subacute osteomyelitis, right ankle and foot (HCC)    Status post below-knee amputation (HCC) 02/17/2021   Wound dehiscence    Gangrene of toe of left foot (HCC)    Cutaneous abscess of left foot    Left foot infection 02/07/2021   Leukocytosis 02/07/2021   Thrombocytosis 02/07/2021   Hyponatremia 02/07/2021   AKI (acute kidney injury) (HCC) 02/07/2021   Hyperglycemia due to diabetes mellitus (HCC) 02/07/2021   Hypoalbuminemia due to protein-calorie malnutrition (HCC) 02/07/2021   Osteomyelitis of second toe of left foot (HCC)    New onset of congestive heart failure (HCC) 12/26/2020   CKD (chronic kidney disease) stage 3, GFR 30-59 ml/min (HCC) 12/26/2020   Acute systolic CHF (congestive heart failure) (HCC) 12/26/2020   Anemia, chronic disease 06/22/2020   Amputee, great toe, right (HCC) 06/20/2020   Normocytic anemia 06/20/2020   Erectile dysfunction associated with type 2 diabetes mellitus (HCC) 06/20/2020   Positive for macroalbuminuria 10/24/2019   Amputation of left great toe (HCC) 10/23/2019   Tobacco dependence 10/23/2019   Osteomyelitis of great toe of right foot (HCC) 06/04/2019   Diabetic foot infection (HCC)    Noncompliance    Glaucoma    Hyperlipidemia    Essential hypertension 11/06/2003   Diabetes mellitus (HCC) 11/05/1997   Past Medical History:  Diagnosis Date   CHF (congestive heart failure) (HCC)    Dehiscence of amputation stump (HCC)    left great toe   Diabetes mellitus without complication (HCC) 1999   Type II   Glaucoma 2015   Hyperlipidemia    Hypertension 2005   Left foot infection 02/07/2021   Osteomyelitis (HCC)    Wears glasses     Family History  Problem Relation Age of Onset   Stroke  Mother    Diabetes Mother        Toward end of life    Past Surgical History:  Procedure Laterality Date   AMPUTATION Left 06/05/2019   Procedure: LEFT GREAT TOE AMPUTATION;  Surgeon: [name], MD;  Location: [facility] OR;  Service: Orthopedics;  Laterality: Left;   AMPUTATION Left 07/10/2019   Procedure: LEFT FOOT 1ST RAY AMPUTATION;  Surgeon: [name], MD;  Location: [facility] OR;  Service: Orthopedics;  Laterality: Left;   AMPUTATION Right 05/25/2020   Procedure: RIGHT GREAT TOE AMPUTATION;  Surgeon: [name], MD;  Location: [facility] OR;  Service: Orthopedics;  Laterality: Right;   AMPUTATION Left 01/25/2021   Procedure: LEFT 2ND TOE AMPUTATION;  Surgeon: [name], MD;  Location: [facility] OR;  Service: Orthopedics;  Laterality: Left;   AMPUTATION Left 02/08/2021   Procedure: LEFT TRANSMETATARSAL AMPUTATION;  Surgeon: [name], MD;  Location: [facility] OR;  Service: Orthopedics;  Laterality: Left;   AMPUTATION Left 02/10/2021   Procedure: AMPUTATION BELOW KNEE;  Surgeon: [name], MD;  Location: [facility] OR;  Service: Orthopedics;  Laterality: Left;   AMPUTATION Right 04/14/2021   Procedure: RIGHT 1ST AND 2ND RAY AMPUTATION;  Surgeon: [name], MD;  Location: [facility] OR;  Service: Orthopedics;  Laterality: Right;   AMPUTATION Right 04/26/2021   Procedure: RIGHT BELOW KNEE AMPUTATION;  Surgeon: [name], MD;  Location: [facility] OR;  Service: Orthopedics;  Laterality: Right;   NO PAST SURGERIES     RIGHT/LEFT HEART CATH AND CORONARY ANGIOGRAPHY N/A 01/02/2021   Procedure: RIGHT/LEFT HEART CATH AND CORONARY ANGIOGRAPHY;  Surgeon: Runell Gess, MD;  Location: [facility] INVASIVE CV LAB;  Service: Cardiovascular;  Laterality: N/A;   Social History   Occupational History   Not on file  Tobacco Use   Smoking status: Every Day    Types: Cigars, Cigarettes   Smokeless tobacco: Former    Types: Chew   Tobacco comments:    6 daily  Vaping Use   Vaping Use: Never used  Substance and Sexual Activity    Alcohol use: Yes    Alcohol/week: 11.0 standard drinks    Types: 3 Cans of beer, 8 Shots of liquor per week    Comment: occasional   Drug use: Yes    Types: Marijuana    Comment: ocassional - last time 05/08/2020   Sexual activity: Not Currently

## 2021-09-15 NOTE — Telephone Encounter (Signed)
FYI Received call from Lauren (Pharmacist) with [facility] stating they do not have the Oxycodone in a capsule. Lauren asked if she can swap out capsule for Oxycodone tablets. Advised Autumn F of this and she said it was fine.    The number to contact Lauren if needed is [phone]

## 2021-09-16 DIAGNOSIS — E1152 Type 2 diabetes mellitus with diabetic peripheral angiopathy with gangrene: Secondary | ICD-10-CM | POA: Diagnosis not present

## 2021-09-17 DIAGNOSIS — I96 Gangrene, not elsewhere classified: Secondary | ICD-10-CM | POA: Diagnosis not present

## 2021-09-17 DIAGNOSIS — L02612 Cutaneous abscess of left foot: Secondary | ICD-10-CM | POA: Diagnosis not present

## 2021-09-17 DIAGNOSIS — T8130XA Disruption of wound, unspecified, initial encounter: Secondary | ICD-10-CM | POA: Diagnosis not present

## 2021-09-17 DIAGNOSIS — L089 Local infection of the skin and subcutaneous tissue, unspecified: Secondary | ICD-10-CM | POA: Diagnosis not present

## 2021-09-18 DIAGNOSIS — E1152 Type 2 diabetes mellitus with diabetic peripheral angiopathy with gangrene: Secondary | ICD-10-CM | POA: Diagnosis not present

## 2021-09-19 ENCOUNTER — Telehealth (HOSPITAL_COMMUNITY): Payer: Self-pay | Admitting: Licensed Clinical Social Worker

## 2021-09-19 ENCOUNTER — Ambulatory Visit: Payer: Self-pay

## 2021-09-19 ENCOUNTER — Telehealth: Payer: Self-pay | Admitting: Pharmacist

## 2021-09-19 DIAGNOSIS — E1152 Type 2 diabetes mellitus with diabetic peripheral angiopathy with gangrene: Secondary | ICD-10-CM | POA: Diagnosis not present

## 2021-09-19 NOTE — Telephone Encounter (Signed)
Pt called CSW and informed that his move out date is actually today.  States that the [employer] who CSW had referred pt to was able to get him set up with case worker through [employer] [facility] Raoul Pitch [phone]) who had told him she could assist with a hotel while they work with him to get an apartment but he had been unable to get a hold of this case worker.  CSW attempted to call- left VM.  Emailed [employer] [employer]- awaiting response and called general line and left VM- unable to reach live person.  CSW attempted to call pt back to provide updates but unable to reach.  Will continue to follow and attempt to assist  Burna Sis, LCSW Clinical Social Worker [facility] Desk#: [phone] Cell#: [phone]

## 2021-09-19 NOTE — Patient Outreach (Signed)
09/19/2021 Name: Scott Greer MRN: [id number] DOB: [birth year]  Referred by: [name], MD Reason for referral : No chief complaint on file.   Spoke with patient on phone and he states he is moving out of his apartment today and now was not a good time to talk.    Follow Up Plan: Rescheduled appointment for next available date on 12/9   Cheral Almas PharmD, CPP High Risk Managed Medicaid [place] [phone]

## 2021-09-20 ENCOUNTER — Telehealth (HOSPITAL_COMMUNITY): Payer: Self-pay | Admitting: Licensed Clinical Social Worker

## 2021-09-20 ENCOUNTER — Other Ambulatory Visit (HOSPITAL_COMMUNITY): Payer: Self-pay | Admitting: Cardiology

## 2021-09-20 ENCOUNTER — Ambulatory Visit: Payer: Medicaid Other | Attending: Orthopedic Surgery

## 2021-09-20 ENCOUNTER — Other Ambulatory Visit: Payer: Self-pay | Admitting: Internal Medicine

## 2021-09-20 DIAGNOSIS — E1152 Type 2 diabetes mellitus with diabetic peripheral angiopathy with gangrene: Secondary | ICD-10-CM | POA: Diagnosis not present

## 2021-09-20 DIAGNOSIS — E1121 Type 2 diabetes mellitus with diabetic nephropathy: Secondary | ICD-10-CM

## 2021-09-20 NOTE — Telephone Encounter (Signed)
CSW attempted to call pt back this morning to if he heard back from [employer] regarding temporary housing option- unable to reach left VM  Also attempted to call [place] with [facility] who pt stated he had been in contact with- unable to reach again today  Will continue to follow and assist as needed  Burna Sis, LCSW Clinical Social Worker [facility] Desk#: [phone] Cell#: [phone]

## 2021-09-20 NOTE — Telephone Encounter (Signed)
Requested Prescriptions  Pending Prescriptions Disp Refills  . metFORMIN (GLUCOPHAGE) 500 MG tablet [Pharmacy Med Name: metformin 500 mg tablet] 60 tablet 1    Sig: TAKE ONE TABLET BY MOUTH TWICE DAILY WITH A MEAL     Endocrinology:  Diabetes - Biguanides Failed - 09/20/2021  8:00 AM      Failed - AA eGFR in normal range and within 360 days    GFR calc Af Amer  Date Value Ref Range Status  12/14/2020 57 (L) >59 mL/min/1.73 Final    Comment:    **In accordance with recommendations from the NKF-ASN Task force,**   [facility] is in the process of updating its eGFR calculation to the   2021 CKD-EPI creatinine equation that estimates kidney function   without a race variable.    GFR, Estimated  Date Value Ref Range Status  05/05/2021 >60 >60 mL/min Final    Comment:    (NOTE) Calculated using the CKD-EPI Creatinine Equation (2021)    eGFR  Date Value Ref Range Status  07/18/2021 70 >59 mL/min/1.73 Final         Passed - Cr in normal range and within 360 days    Creatinine, Ser  Date Value Ref Range Status  07/18/2021 1.18 0.76 - 1.27 mg/dL Final         Passed - HBA1C is between 0 and 7.9 and within 180 days    HbA1c, POC (controlled diabetic range)  Date Value Ref Range Status  07/18/2021 6.0 0.0 - 7.0 % Final         Passed - Valid encounter within last 6 months    Recent Outpatient Visits          2 months ago Type 2 diabetes mellitus with other circulatory complications [facility])   [place], Neoma Laming B, MD   5 months ago Type 2 diabetes mellitus with other circulatory complications [facility])   [place] Karle Plumber B, MD   5 months ago Type 2 diabetes mellitus with diabetic nephropathy, without long-term current use of insulin ([facility])   [place], Deborah B, MD   1 year ago Type 2 diabetes mellitus with diabetic nephropathy, without long-term current use of  insulin ([place])   [place], Deborah B, MD   1 year ago Need for influenza vaccination   [place], RPH-CPP      Future Appointments            In 2 weeks [name], [name], MD [place], [facility]   In 1 month Wynetta Emery, Dalbert Batman, MD [place]

## 2021-09-21 DIAGNOSIS — E1152 Type 2 diabetes mellitus with diabetic peripheral angiopathy with gangrene: Secondary | ICD-10-CM | POA: Diagnosis not present

## 2021-09-22 DIAGNOSIS — E1152 Type 2 diabetes mellitus with diabetic peripheral angiopathy with gangrene: Secondary | ICD-10-CM | POA: Diagnosis not present

## 2021-09-23 DIAGNOSIS — E1152 Type 2 diabetes mellitus with diabetic peripheral angiopathy with gangrene: Secondary | ICD-10-CM | POA: Diagnosis not present

## 2021-09-24 DIAGNOSIS — E1152 Type 2 diabetes mellitus with diabetic peripheral angiopathy with gangrene: Secondary | ICD-10-CM | POA: Diagnosis not present

## 2021-09-25 DIAGNOSIS — E1152 Type 2 diabetes mellitus with diabetic peripheral angiopathy with gangrene: Secondary | ICD-10-CM | POA: Diagnosis not present

## 2021-09-26 DIAGNOSIS — E1152 Type 2 diabetes mellitus with diabetic peripheral angiopathy with gangrene: Secondary | ICD-10-CM | POA: Diagnosis not present

## 2021-09-27 DIAGNOSIS — E1152 Type 2 diabetes mellitus with diabetic peripheral angiopathy with gangrene: Secondary | ICD-10-CM | POA: Diagnosis not present

## 2021-09-28 DIAGNOSIS — E1152 Type 2 diabetes mellitus with diabetic peripheral angiopathy with gangrene: Secondary | ICD-10-CM | POA: Diagnosis not present

## 2021-09-29 DIAGNOSIS — E1152 Type 2 diabetes mellitus with diabetic peripheral angiopathy with gangrene: Secondary | ICD-10-CM | POA: Diagnosis not present

## 2021-09-30 DIAGNOSIS — E1152 Type 2 diabetes mellitus with diabetic peripheral angiopathy with gangrene: Secondary | ICD-10-CM | POA: Diagnosis not present

## 2021-10-02 DIAGNOSIS — E1152 Type 2 diabetes mellitus with diabetic peripheral angiopathy with gangrene: Secondary | ICD-10-CM | POA: Diagnosis not present

## 2021-10-03 DIAGNOSIS — E1152 Type 2 diabetes mellitus with diabetic peripheral angiopathy with gangrene: Secondary | ICD-10-CM | POA: Diagnosis not present

## 2021-10-04 ENCOUNTER — Other Ambulatory Visit: Payer: Self-pay | Admitting: *Deleted

## 2021-10-04 ENCOUNTER — Other Ambulatory Visit: Payer: Self-pay

## 2021-10-04 ENCOUNTER — Telehealth (HOSPITAL_COMMUNITY): Payer: Self-pay | Admitting: Licensed Clinical Social Worker

## 2021-10-04 DIAGNOSIS — E1152 Type 2 diabetes mellitus with diabetic peripheral angiopathy with gangrene: Secondary | ICD-10-CM | POA: Diagnosis not present

## 2021-10-04 NOTE — Telephone Encounter (Signed)
CSW informed that pt is now being evicted from apartment tomorrow.  Spoke with [facility] where I had referred pt and who has been working with pt- they had helped with getting extension at current apartment while he tried to identify housing.  DAC is trying to help him find shelter and had applied for pt to go to [employer]- trying to get update on status of application.  CSW emailed Benita Curtain with the [facility] to get update on status of the application and see if he could be prioritized for admission tomorrow- awaiting response.  Will continue to see if there are other options available for this pt.  Burna Sis, LCSW Clinical Social Worker [facility] Desk#: [phone] Cell#: [phone]

## 2021-10-04 NOTE — Patient Outreach (Signed)
Medicaid Managed Care   Nurse Care Manager Note  10/04/2021 Name:  Scott Greer MRN:  [id number] DOB:  06-01-1956  Scott Greer is an 65 y.o. year old male who is a primary patient of Scott Pier, MD.  The [facility] Managed Care Coordination team was consulted for assistance with:    CHF HTN SDOH needs  Scott Greer was given information about Medicaid Managed Care Coordination team services today. Scott Greer Patient agreed to services and verbal consent obtained.  Engaged with patient by telephone for follow up visit in response to provider referral for case management and/or care coordination services.   Assessments/Interventions:  Review of past medical history, allergies, medications, health status, including review of consultants reports, laboratory and other test data, was performed as part of comprehensive evaluation and provision of chronic care management services.  SDOH (Social Determinants of Health) assessments and interventions performed: SDOH Interventions    Flowsheet Row Most Recent Value  SDOH Interventions   Housing Interventions Other (Comment)  [Collaborated with LCSW, Jenna. Provided contact information for [place] [facility] [place]  Allergies  Allergen Reactions   Bee Venom Hives, Itching and Swelling    Medications Reviewed Today     RNCM unable to review medications with patient during this visit        Patient Active Problem List   Diagnosis Date Noted   S/P BKA (below knee amputation) bilateral ([place]) 07/18/2021   Acute hematogenous osteomyelitis of right foot ([place]) 04/26/2021   Gangrene of right foot ([place])    Foot pain, right 04/14/2021   Subacute osteomyelitis, right ankle and foot ([place])    Status post below-knee amputation ([place]) 02/17/2021   Wound dehiscence    Gangrene of toe of left foot (HCC)    Cutaneous abscess of left foot    Left foot infection 02/07/2021   Leukocytosis 02/07/2021    Thrombocytosis 02/07/2021   Hyponatremia 02/07/2021   AKI (acute kidney injury) ([place]) 02/07/2021   Hyperglycemia due to diabetes mellitus ([place]) 02/07/2021   Hypoalbuminemia due to protein-calorie malnutrition ([place]) 02/07/2021   Osteomyelitis of second toe of left foot ([place])    New onset of congestive heart failure ([place]) 12/26/2020   CKD (chronic kidney disease) stage 3, GFR 30-59 ml/min (HCC) 17/79/3903   Acute systolic CHF (congestive heart failure) ([place]) 12/26/2020   Anemia, chronic disease 06/22/2020   Amputee, great toe, right ([place]) 06/20/2020   Normocytic anemia 06/20/2020   Erectile dysfunction associated with type 2 diabetes mellitus ([place]) 06/20/2020   Positive for macroalbuminuria 10/24/2019   Amputation of left great toe ([place]) 10/23/2019   Tobacco dependence 10/23/2019   Osteomyelitis of great toe of right foot ([place]) 06/04/2019   Diabetic foot infection ([place])    Noncompliance    Glaucoma    Hyperlipidemia    Essential hypertension 11/06/2003   Diabetes mellitus ([place]) 11/05/1997    Conditions to be addressed/monitored per PCP order:  CHF and HTN  Care Plan : RN Care Manager Plan of Care  Updates made by [name], RN since 10/04/2021 12:00 AM     Problem: Knowledge Deficits and Care Coordination needs related to long term management of CHF and HTN   Priority: High     Long-Range Goal: Development of Plan of Care to address Care Coordination needs and knowledge deficits related to CHF, HTN and SDOH   Start Date: 10/04/2021  Expected End Date: 01/02/2022  Priority: High  Note:   Current Barriers:  Knowledge Deficits related to plan of care for management of CHF and HTN  Care Coordination needs related to Housing barriers Scott Greer is in need of housing. He has to be out of his current apartment by tomorrow. He has been working with multiple resources to locate housing. Scott Greer was primarily focused on his housing barrier today. RNCM discussed the importance  of following up on provided resources and making sure to answer all calls. He is planning to reach out to Scott Greer and his Case Worker at [employer] for housing assistance  RNCM Clinical Goal(s):  Patient will verbalize understanding of plan for management of CHF and HTN as evidenced by patient verbalization and self care activities take all medications exactly as prescribed and will call provider for medication related questions as evidenced by documentation in EMR    attend all scheduled medical appointments: 10/06/21 with [name] as evidenced by provider documentation in EMR        demonstrate improved adherence to prescribed treatment plan for CHF and HTN as evidenced by documentation in EMR work with Education officer, museum to address Housing barriers related to the management of CHF and HTN as evidenced by review of EMR and patient or Education officer, museum report     through collaboration with Consulting civil engineer, provider, and care team.   Interventions: Inter-disciplinary care team collaboration (see longitudinal plan of care) Evaluation of current treatment plan related to  self management and patient's adherence to plan as established by provider Provided patient with [facility] contact(noted in LCSW note) Collaborated with Eliezer Lofts, LCSW for housing needs   Heart Failure Interventions:  (Status: New goal.)  Long Term Goal  Assessed social determinant of health barriers Advised patient to attend appointment with [name] on 10/06/21 @ 4:20, provided phone number and address to office  Hypertension: (Status: New goal.) Last practice recorded BP readings:  BP Readings from Last 3 Encounters:  07/18/21 120/79  05/06/21 119/88  05/02/21 113/66  Most recent eGFR/CrCl:  Lab Results  Component Value Date   EGFR 70 07/18/2021    No components found for: CRCL  Discussed plans with patient for ongoing care management follow up and provided patient with direct contact information for  care management team; Assessed social determinant of health barriers;   Patient Goals/Self-Care Activities: Take medications as prescribed   Attend all scheduled provider appointments Call pharmacy for medication refills 3-7 days in advance of running out of medications Call provider office for new concerns or questions        Follow Up:  Patient agrees to Care Plan and Follow-up.  Plan: The Managed Medicaid care management team will reach out to the patient again over the next 7 days.  Date/time of next scheduled RN care management/care coordination outreach:  10/11/21 @ 10:15am  [name] RN, BSN [place]  [employer] manager

## 2021-10-04 NOTE — Patient Instructions (Signed)
Visit Information  Mr. Byrns was given information about Medicaid Managed Care team care coordination services as a part of their [facility] Medicaid benefit. [name] verbally consented to engagement with the [facility] Managed Care team.   If you are experiencing a medical emergency, please call 911 or report to your local emergency department or urgent care.   If you have a non-emergency medical problem during routine business hours, please contact your provider's office and ask to speak with a nurse.   For questions related to your [facility] health plan, please call: [phone] or go here:[email or web address]  If you would like to schedule transportation through your [facility] plan, please call the following number at least 2 days in advance of your appointment: [phone].  Call the [facility] at [phone], at any time, 24 hours a day, 7 days a week. If you are in danger or need immediate medical attention call 911.  If you would like help to quit smoking, call 1-800-QUIT-NOW 309-815-7726) OR Espaol: 1-855-Djelo-Ya (0-355-974-1638) o para ms informacin haga clic aqu or Text READY to 200-400 to register via text  Mr. Repetto - following are the goals we discussed in your visit today:   Goals Addressed   None     Please see education materials related to CHF provided as print materials.   The patient verbalized understanding of instructions provided today and agreed to receive a mailed copy of patient instruction and/or educational materials.  Telephone follow up appointment with Managed Medicaid care management team member scheduled for:10/11/21 @ 10:15am  [name] RN, BSN [place] RN Care Coordinator   Following is a copy of your plan of care:  Care Plan : RN Care Manager Plan of Care  Updates made by [name], RN since 10/04/2021 12:00 AM     Problem: Knowledge Deficits and  Care Coordination needs related to long term management of CHF and HTN   Priority: High     Long-Range Goal: Development of Plan of Care to address Care Coordination needs and knowledge deficits related to CHF, HTN and SDOH   Start Date: 10/04/2021  Expected End Date: 01/02/2022  Priority: High  Note:   Current Barriers:  Knowledge Deficits related to plan of care for management of CHF and HTN  Care Coordination needs related to Housing barriers Mr. Goldfarb is in need of housing. He has to be out of his current apartment by tomorrow. He has been working with multiple resources to locate housing. Mr. Boursiquot was primarily focused on his housing barrier today. RNCM discussed the importance of following up on provided resources and making sure to answer all calls. He is planning to reach out to [employer] and his Case Worker at [employer] for housing assistance  RNCM Clinical Goal(s):  Patient will verbalize understanding of plan for management of CHF and HTN as evidenced by patient verbalization and self care activities take all medications exactly as prescribed and will call provider for medication related questions as evidenced by documentation in EMR    attend all scheduled medical appointments: 10/06/21 with [name] as evidenced by provider documentation in EMR        demonstrate improved adherence to prescribed treatment plan for CHF and HTN as evidenced by documentation in EMR work with social worker to address Housing barriers related to the management of CHF and HTN as evidenced by review of EMR and patient or Education officer, museum report  through collaboration with Consulting civil engineer, provider, and care team.   Interventions: Inter-disciplinary care team collaboration (see longitudinal plan of care) Evaluation of current treatment plan related to  self management and patient's adherence to plan as established by provider Provided patient with [facility] contact(noted in LCSW  note) Collaborated with Eliezer Lofts, LCSW for housing needs   Heart Failure Interventions:  (Status: New goal.)  Long Term Goal  Assessed social determinant of health barriers Advised patient to attend appointment with [name] on 10/06/21 @ 4:20, provided phone number and address to office  Hypertension: (Status: New goal.) Last practice recorded BP readings:  BP Readings from Last 3 Encounters:  07/18/21 120/79  05/06/21 119/88  05/02/21 113/66  Most recent eGFR/CrCl:  Lab Results  Component Value Date   EGFR 70 07/18/2021    No components found for: CRCL  Discussed plans with patient for ongoing care management follow up and provided patient with direct contact information for care management team; Assessed social determinant of health barriers;   Patient Goals/Self-Care Activities: Take medications as prescribed   Attend all scheduled provider appointments Call pharmacy for medication refills 3-7 days in advance of running out of medications Call provider office for new concerns or questions

## 2021-10-05 DIAGNOSIS — Z419 Encounter for procedure for purposes other than remedying health state, unspecified: Secondary | ICD-10-CM | POA: Diagnosis not present

## 2021-10-05 DIAGNOSIS — E1152 Type 2 diabetes mellitus with diabetic peripheral angiopathy with gangrene: Secondary | ICD-10-CM | POA: Diagnosis not present

## 2021-10-06 ENCOUNTER — Ambulatory Visit: Payer: Medicaid Other | Admitting: Cardiovascular Disease

## 2021-10-07 DIAGNOSIS — E1152 Type 2 diabetes mellitus with diabetic peripheral angiopathy with gangrene: Secondary | ICD-10-CM | POA: Diagnosis not present

## 2021-10-08 ENCOUNTER — Other Ambulatory Visit: Payer: Self-pay | Admitting: Internal Medicine

## 2021-10-08 DIAGNOSIS — E1152 Type 2 diabetes mellitus with diabetic peripheral angiopathy with gangrene: Secondary | ICD-10-CM | POA: Diagnosis not present

## 2021-10-08 DIAGNOSIS — E559 Vitamin D deficiency, unspecified: Secondary | ICD-10-CM

## 2021-10-08 NOTE — Telephone Encounter (Signed)
Requested medication (s) are due for refill today: yes  Requested medication (s) are on the active medication list: yes  Last refill:  08/03/21 #4  Future visit scheduled: yes  Notes to clinic:  med not delegated to NT to RF   Requested Prescriptions  Pending Prescriptions Disp Refills   Vitamin D, Ergocalciferol, (DRISDOL) 1.25 MG (50000 UNIT) CAPS capsule [Pharmacy Med Name: Vitamin D2 1,250 mcg (50,000 unit) capsule] 4 capsule 0    Sig: TAKE ONE CAPSULE BY MOUTH ONCE WEEKLY ON SUNDAY     Endocrinology:  Vitamins - Vitamin D Supplementation Failed - 10/08/2021 11:33 AM      Failed - 50,000 IU strengths are not delegated      Failed - Vitamin D in normal range and within 360 days    Vit D, 25-Hydroxy  Date Value Ref Range Status  12/14/2020 9.2 (L) 30.0 - 100.0 ng/mL Final    Comment:    Vitamin D deficiency has been defined by the Institute of Medicine and an Endocrine Society practice guideline as a level of serum 25-OH vitamin D less than 20 ng/mL (1,2). The Endocrine Society went on to further define vitamin D insufficiency as a level between 21 and 29 ng/mL (2). 1. IOM (Institute of Medicine). 2010. Dietary reference    intakes for calcium and D. [place]: The    [employer]. 2. Holick MF, Binkley Sorrento, Bischoff-Ferrari HA, et al.    Evaluation, treatment, and prevention of vitamin D    deficiency: an Endocrine Society clinical practice    guideline. JCEM. 2011 Jul; 96(7):1911-30.           Passed - Ca in normal range and within 360 days    Calcium  Date Value Ref Range Status  07/18/2021 10.1 8.6 - 10.2 mg/dL Final   Calcium, Ion  Date Value Ref Range Status  01/02/2021 1.15 1.15 - 1.40 mmol/L Final          Passed - Phosphate in normal range and within 360 days    Phosphorus  Date Value Ref Range Status  02/07/2021 3.2 2.5 - 4.6 mg/dL Final    Comment:    Performed at [facility], [address], [place], [place] [address]           Passed - Valid encounter within last 12 months    Recent Outpatient Visits           2 months ago Type 2 diabetes mellitus with other circulatory complications [facility])   [facility] Jonah Blue B, MD   6 months ago Type 2 diabetes mellitus with other circulatory complications [facility] [facility])   [place] Jonah Blue B, MD   6 months ago Type 2 diabetes mellitus with diabetic nephropathy, without long-term current use of insulin (HCC)   [place] [facility] Jonah Blue B, MD   1 year ago Type 2 diabetes mellitus with diabetic nephropathy, without long-term current use of insulin (HCC)   [place] [facility] [name], MD   1 year ago Need for influenza vaccination   [facility] Lois Huxley, Cornelius Moras, RPH-CPP       Future Appointments             In 1 month [place] Binnie Rail, MD [facility]

## 2021-10-11 ENCOUNTER — Other Ambulatory Visit: Payer: Self-pay | Admitting: *Deleted

## 2021-10-11 DIAGNOSIS — E1152 Type 2 diabetes mellitus with diabetic peripheral angiopathy with gangrene: Secondary | ICD-10-CM | POA: Diagnosis not present

## 2021-10-11 NOTE — Patient Outreach (Signed)
Care Coordination  10/11/2021  Scott Greer Sep 10, 1956 [id number]   Medicaid Managed Care   Unsuccessful Outreach Note  10/11/2021 Name: Scott Greer MRN: [id number] DOB: [birth year]  Referred by: [name], MD Reason for referral : High Risk Managed Medicaid (Unsuccessful RNCM follow up telephone outreach)   An unsuccessful telephone outreach was attempted today. The patient was referred to the case management team for assistance with care management and care coordination.   Follow Up Plan: The care management team will reach out to the patient again over the next 14 days.   Estanislado Emms RN, BSN [place]  [employer]

## 2021-10-11 NOTE — Patient Instructions (Signed)
Visit Information  [name]  - as a part of your Medicaid benefit, you are eligible for care management and care coordination services at no cost or copay. I was unable to reach you by phone today but would be happy to help you with your health related needs. Please feel free to call me @ [phone].   A member of the Managed Medicaid care management team will reach out to you again over the next 14 days.   Deseray Daponte RN, BSN [place]  [facility] RN Care Coordinator   

## 2021-10-12 DIAGNOSIS — E1152 Type 2 diabetes mellitus with diabetic peripheral angiopathy with gangrene: Secondary | ICD-10-CM | POA: Diagnosis not present

## 2021-10-13 ENCOUNTER — Other Ambulatory Visit: Payer: Self-pay

## 2021-10-13 DIAGNOSIS — E1152 Type 2 diabetes mellitus with diabetic peripheral angiopathy with gangrene: Secondary | ICD-10-CM | POA: Diagnosis not present

## 2021-10-13 NOTE — Patient Outreach (Signed)
Medicaid Managed Care Pharmacy Note  10/13/2021 Name:  Scott Greer MRN:  [id number] DOB:  June 10, 1956  Scott Greer is an 65 y.o. year old male who is a primary patient of [name], MD.  The [facility] Managed Care Coordination team was consulted for assistance with disease management and care coordination needs.    Engaged with patient by telephone for follow up visit in response to referral for case management and/or care coordination services.  Scott Greer was given information about Medicaid Managed Care Coordination team services today. [name] Patient agreed to services and verbal consent obtained.  Objective:  Lab Results  Component Value Date   CREATININE 1.18 07/18/2021   CREATININE 1.31 (H) 05/05/2021   CREATININE 1.21 04/28/2021    Lab Results  Component Value Date   HGBA1C 6.0 07/18/2021       Component Value Date/Time   CHOL 125 12/14/2020 1156   TRIG 90 12/14/2020 1156   HDL 61 12/14/2020 1156   CHOLHDL 2.0 12/14/2020 1156   LDLCALC 47 12/14/2020 1156    BP Readings from Last 3 Encounters:  07/18/21 120/79  05/06/21 119/88  05/02/21 113/66    Care Plan  Allergies  Allergen Reactions   Bee Venom Hives, Itching and Swelling    Medications Reviewed Today     Reviewed by [name], RN (Registered Nurse) on 10/04/21 at [phone]  Med List Status: <None>   Medication Order Taking? Sig Documenting Provider Last Dose Status Informant  amiodarone (PACERONE) 200 MG tablet [id number] No Take 1 tablet (200 mg total) by mouth daily.  Patient taking differently: Take 200 mg by mouth daily with lunch.   [name], MD Taking Active Self  aspirin 81 MG chewable tablet [id number] No Chew 1 tablet (81 mg total) by mouth daily.  Patient taking differently: Chew 81 mg by mouth daily with breakfast.   [name], MD Taking Active   atorvastatin (LIPITOR) 10 MG tablet [id number] No Take 1 tablet (10 mg total) by mouth daily.  Patient  taking differently: Take 10 mg by mouth daily with supper.   [name], MD Taking Active Self  BIDIL 20-37.5 MG tablet [id number]  TAKE ONE TABLET BY MOUTH EVERY MORNING and TAKE ONE TABLET BY MOUTH AT NOON and TAKE ONE TABLET BY MOUTH EVERY EVENING [name], MD  Active   Blood Glucose Monitoring Suppl (TRUE METRIX METER) w/Device KIT [id number] No Use as directed [name], MD Unknown Active Other  carvedilol (COREG) 3.125 MG tablet [id number]  TAKE ONE TABLET BY MOUTH EVERY MORNING and TAKE ONE TABLET EVERY EVENING WITH A MEAL [name], MD  Active   digoxin (LANOXIN) 0.125 MG tablet [id number] No Take 1 tablet (0.125 mg total) by mouth daily.  Patient taking differently: Take 0.125 mg by mouth daily with supper.   [name], MD Taking Active Self  empagliflozin (JARDIANCE) 10 MG TABS tablet [id number] No Take 1 tablet (10 mg total) by mouth daily.  Patient taking differently: Take 10 mg by mouth daily with lunch.   [name], MD Taking Active Self  ENTRESTO 24-26 [place] [id number]  TAKE ONE TABLET BY MOUTH EVERY MORNING and TAKE ONE TABLET BY MOUTH EVERY EVENING [name], MD  Active   ferrous sulfate 325 (65 FE) MG tablet [id number] No Take 1 tablet (325 mg total) by mouth daily with breakfast. [name], MD Taking Active Self  gabapentin (NEURONTIN) 300  MG capsule [id number] No TAKE ONE CAPSULE BY MOUTH THREE TIMES DAILY [name], MD Taking Active   glucose blood (TRUE METRIX BLOOD GLUCOSE TEST) test strip [id number] No Use as instructed [name], MD Unknown Active Other  metFORMIN (GLUCOPHAGE) 500 MG tablet [id number]  TAKE ONE TABLET BY MOUTH TWICE DAILY WITH A MEAL [name], MD  Active   nutrition supplement, [name]) PACK [id number] No Take 1 packet by mouth 2 (two) times daily between meals. Persons, [name], [name] Unknown Active   oxycodone (OXY-IR) 5 MG capsule [id number]  Take 1 capsule (5 mg total) by mouth  every 6 (six) hours as needed. [name], NP  Active   potassium chloride SA (KLOR-CON) 20 MEQ tablet [id number]  TAKE ONE TABLET BY MOUTH EVERY MORNING [name], MD  Active   spironolactone (ALDACTONE) 25 MG tablet [id number]  TAKE ONE TABLET BY MOUTH EVERY MORNING [name], MD  Active   torsemide (DEMADEX) 20 MG tablet [id number]  Take 1 tablet (20 mg total) by mouth every morning. Needs appt for further refills [name], MD  Active   TRUEplus Lancets 28G MISC [id number] No Use as directed [name], MD Unknown Active Other  Vitamin D, Ergocalciferol, (DRISDOL) 1.25 MG (50000 UNIT) CAPS capsule [id number]  TAKE ONE CAPSULE BY MOUTH ONCE WEEKLY ON [name], MD  Active   zinc sulfate 220 (50 Zn) MG capsule [id number] No Take 1 capsule (220 mg total) by mouth daily.  Patient not taking: Reported on 08/01/2021   Persons, [name], [name] Not Taking Active             Patient Active Problem List   Diagnosis Date Noted   S/P BKA (below knee amputation) bilateral ([place]) 07/18/2021   Acute hematogenous osteomyelitis of right foot ([place]) 04/26/2021   Gangrene of right foot (H[facility])    Foot pain, right 04/14/2021   Subacute osteomyelitis, right ankle and foot ([place])    Status post below-knee amputation ([place]) 02/17/2021   Wound dehiscence    Gangrene of toe of left foot ([place])    Cutaneous abscess of left foot    Left foot infection 02/07/2021   Leukocytosis 02/07/2021   Thrombocytosis 02/07/2021   Hyponatremia 02/07/2021   AKI (acute kidney injury) ([place]) 02/07/2021   Hyperglycemia due to diabetes mellitus ([place]) 02/07/2021   Hypoalbuminemia due to protein-calorie malnutrition ([place]) 02/07/2021   Osteomyelitis of second toe of left foot ([place])    New onset of congestive heart failure ([place]) 12/26/2020   CKD (chronic kidney disease) stage 3, GFR 30-59 ml/min (HCC) 09/38/1829   Acute systolic CHF (congestive heart failure) ([place]) 12/26/2020   Anemia,  chronic disease 06/22/2020   Amputee, great toe, right ([place]) 06/20/2020   Normocytic anemia 06/20/2020   Erectile dysfunction associated with type 2 diabetes mellitus ([place]) 06/20/2020   Positive for macroalbuminuria 10/24/2019   Amputation of left great toe ([place]) 10/23/2019   Tobacco dependence 10/23/2019   Osteomyelitis of great toe of right foot ([place]) 06/04/2019   Diabetic foot infection ([place])    Noncompliance    Glaucoma    Hyperlipidemia    Essential hypertension 11/06/2003   Diabetes mellitus ([place]) 11/05/1997    Conditions to be addressed/monitored per PCP order:  CHF, HTN, HLD, DMII, and Tobacco Use  Care Plan : Medication Management  Updates made by Hughes Better, RPH-CPP since 10/13/2021 12:00 AM  Completed 10/13/2021   Problem: Health Promotion or  Disease Self-Management (General Plan of Care) Resolved 10/13/2021     Care Plan : General Pharmacy (Adult)  Updates made by Hughes Better, RPH-CPP since 10/13/2021 12:00 AM     Problem: Chronic Disease Management   Priority: High  Onset Date: 10/13/2021     Long-Range Goal: Managing Chronic Disease Therapies   Start Date: 10/13/2021  Expected End Date: 01/11/2022  This Visit's Progress: On track  Priority: High  Note:   Current Barriers:  Does not adhere to prescribed medication regimen Does not contact provider office for questions/concerns   Pharmacist Clinical Goal(s):  patient will adhere to prescribed medication regimen as evidenced by discuss with patient regarding adherence contact provider office for questions/concerns as evidenced notation of same in electronic health record through collaboration with PharmD and provider.    Interventions: Inter-disciplinary care team collaboration (see longitudinal plan of care) Comprehensive medication review performed; medication list updated in electronic medical record  Diabetes:  Controlled; current treatment: Metformin 555m twice daily, Empagliflozin  116m Current glucose readings: patient reports he does not check often, last checked around one month ago  Denies hypoglycemic/hyperglycemic symptoms  Educated on importance of checking blood glucose to assess effectiveness of therapy; recommended patient check 2-3x/week fasting  Hypertension:  Controlled; current treatment: Entresto 24-2642mCarvedilol 3.125m59mice daily, BIDIL 20-37.5mg 18mly, spironolactone 25mg 19my  Current home readings: does not have blood pressure monitor  Denies hypotensive/hypertensive symptoms  Educated on ability to [place]  Tobacco Abuse:  8 Swisher Cigars per day  Patient not interested in tobacco cessation at this time as he states "it is all I have"  Educated on importance of tobacco cessation and patient agreed to re-visit topic at next follow-up visit  Pain (below knee amputation)       Uncontrolled; current treatment: tylenol OTC       Patient reports last being on Oxycodone 5mg re51mtly but that this was not sufficient at controlling his pain       Rates pain 5/10 today       Recommended patient discuss with Ortho at next follow-up appointment  Heart Failure       Controlled; current treatment: Entresto 24-26mg, C68mdilol 3.125mg twi19maily, Torsemide 20mg dail68mIDIL 20-37.5mg daily,14mironolactone 25mg daily,94miodarone 200mg, aspiri18mmg, Digoxin74m25mg daily    1mecommended continued follow-up with cardiology  Patient Goals/Self-Care Activities patient will:  - take medications as prescribed as evidenced by patient report and record review check glucose 2-3x/week, document, and provide at future appointments  Follow Up Plan:  Telephone follow up appointment with care management team member scheduled for: 10/18/21 Care Manager; 11/24/21 Pharmacist Next PCP appointment scheduled for: 11/17/21 Next Cardiology appointment scheduled for: 11/28/21     Aylyn Wenzler KelleyHughes Betterigh Risk Managed Medicaid [place] ([phone]

## 2021-10-13 NOTE — Patient Instructions (Signed)
Visit Information  Scott Greer was given information about Medicaid Managed Care team care coordination services as a part of their [facility] Medicaid benefit. Scott Greer verbally consented to engagement with the [facility] Managed Care team.   If you are experiencing a medical emergency, please call 911 or report to your local emergency department or urgent care.   If you have a non-emergency medical problem during routine business hours, please contact your provider's office and ask to speak with a nurse.   For questions related to your [facility] health plan, please call: [phone] or go here:[email or web address]  If you would like to schedule transportation through your [facility] plan, please call the following number at least 2 days in advance of your appointment: [phone].  Call the [facility] Crisis Line at [phone], at any time, 24 hours a day, 7 days a week. If you are in danger or need immediate medical attention call 911.  If you would like help to quit smoking, call 1-800-QUIT-NOW (936-373-7842) OR Espaol: 1-855-Djelo-Ya (8-502-774-1287) o para ms informacin haga clic aqu or Text READY to 867-672 to register via text  Scott Greer - following are the goals we discussed in your visit today:   Goals Addressed   None     The patient verbalized understanding of instructions provided today and declined a print copy of patient instruction materials.   RN Care Manager will call on 10/18/21 Pharmacist will call on 11/24/21 Next PCP appointment:  11/17/21 Next cardiology appointment: 1/24//23  Cheral Almas PharmD, CPP High Risk Managed Medicaid [place] [phone]  Following is a copy of your plan of care:  Care Plan : Medication Management  Updates made by Cheral Almas, RPH-CPP since 10/13/2021 12:00 AM  Completed 10/13/2021   Problem: Health Promotion or Disease Self-Management (General Plan of Care) Resolved  10/13/2021     Care Plan : General Pharmacy (Adult)  Updates made by Cheral Almas, RPH-CPP since 10/13/2021 12:00 AM     Problem: Chronic Disease Management   Priority: High  Onset Date: 10/13/2021     Long-Range Goal: Managing Chronic Disease Therapies   Start Date: 10/13/2021  Expected End Date: 01/11/2022  This Visit's Progress: On track  Priority: High  Note:   Current Barriers:  Does not adhere to prescribed medication regimen Does not contact provider office for questions/concerns   Pharmacist Clinical Goal(s):  patient will adhere to prescribed medication regimen as evidenced by discuss with patient regarding adherence contact provider office for questions/concerns as evidenced notation of same in electronic health record through collaboration with PharmD and provider.    Interventions: Inter-disciplinary care team collaboration (see longitudinal plan of care) Comprehensive medication review performed; medication list updated in electronic medical record  Diabetes:  Controlled; current treatment: Metformin 500mg  twice daily, Empagliflozin 10mg   Current glucose readings: patient reports he does not check often, last checked around one month ago  Denies hypoglycemic/hyperglycemic symptoms  Educated on importance of checking blood glucose to assess effectiveness of therapy; recommended patient check 2-3x/week fasting  Hypertension:  Controlled; current treatment: Entresto 24-26mg , Carvedilol 3.125mg  twice daily, BIDIL 20-37.5mg  daily, spironolactone 25mg  daily  Current home readings: does not have blood pressure monitor  Denies hypotensive/hypertensive symptoms  Educated on ability to check blood pressure at or [employer] for free  Tobacco Abuse:  8 Swisher Cigars per day  Patient not interested in tobacco cessation at this time as he states "it is all I have"  Educated on importance of tobacco cessation  and patient agreed to re-visit topic at next  follow-up visit  Pain (below knee amputation)       Uncontrolled; current treatment: tylenol OTC       Patient reports last being on Oxycodone 5mg  recently but that this was not sufficient at controlling his pain       Rates pain 5/10 today       Recommended patient discuss with Ortho at next follow-up appointment  Heart Failure       Controlled; current treatment: Entresto 24-26mg , Carvedilol 3.125mg  twice daily, Torsemide 20mg  daily, BIDIL 20-37.5mg  daily, spironolactone 25mg  daily,  amiodarone 200mg , aspirin 81mg , Digoxin 0.125mg  daily       Recommended continued follow-up with cardiology  Patient Goals/Self-Care Activities patient will:  - take medications as prescribed as evidenced by patient report and record review check glucose 2-3x/week, document, and provide at future appointments  Follow Up Plan:  Telephone follow up appointment with care management team member scheduled for: 10/18/21 Care Manager; 11/24/21 Pharmacist Next PCP appointment scheduled for: 11/17/21 Next Cardiology appointment scheduled for: 11/28/21

## 2021-10-14 DIAGNOSIS — E1152 Type 2 diabetes mellitus with diabetic peripheral angiopathy with gangrene: Secondary | ICD-10-CM | POA: Diagnosis not present

## 2021-10-16 DIAGNOSIS — E1152 Type 2 diabetes mellitus with diabetic peripheral angiopathy with gangrene: Secondary | ICD-10-CM | POA: Diagnosis not present

## 2021-10-17 DIAGNOSIS — T8130XA Disruption of wound, unspecified, initial encounter: Secondary | ICD-10-CM | POA: Diagnosis not present

## 2021-10-17 DIAGNOSIS — I96 Gangrene, not elsewhere classified: Secondary | ICD-10-CM | POA: Diagnosis not present

## 2021-10-17 DIAGNOSIS — L089 Local infection of the skin and subcutaneous tissue, unspecified: Secondary | ICD-10-CM | POA: Diagnosis not present

## 2021-10-17 DIAGNOSIS — E1152 Type 2 diabetes mellitus with diabetic peripheral angiopathy with gangrene: Secondary | ICD-10-CM | POA: Diagnosis not present

## 2021-10-17 DIAGNOSIS — L02612 Cutaneous abscess of left foot: Secondary | ICD-10-CM | POA: Diagnosis not present

## 2021-10-18 ENCOUNTER — Other Ambulatory Visit: Payer: Self-pay | Admitting: *Deleted

## 2021-10-18 ENCOUNTER — Other Ambulatory Visit: Payer: Self-pay

## 2021-10-18 NOTE — Patient Outreach (Signed)
Medicaid Managed Care   Nurse Care Manager Note  10/18/2021 Name:  Scott Greer MRN:  [id number] DOB:  1956/01/22  Scott Greer is an 65 y.o. year old male who is a primary patient of [name], MD.  The [facility] Managed Care Coordination team was consulted for assistance with:    CHF  Scott Greer was given information about Medicaid Managed Care Coordination team services today. Scott Greer Patient agreed to services and verbal consent obtained.  Engaged with patient by telephone for follow up visit in response to provider referral for case management and/or care coordination services.   Assessments/Interventions:  Review of past medical history, allergies, medications, health status, including review of consultants reports, laboratory and other test data, was performed as part of comprehensive evaluation and provision of chronic care management services.  SDOH (Social Determinants of Health) assessments and interventions performed: SDOH Interventions    Flowsheet Row Most Recent Value  SDOH Interventions   Financial Strain Interventions Other (Comment)  [Patient working with BSW]  Social Connections Interventions Patient Refused  [Receiving resources from [place]  Allergies  Allergen Reactions   Bee Venom Hives, Itching and Swelling    Medications Reviewed Today     Reviewed by Hughes Better, RPH-CPP (Pharmacist) on 10/13/21 at [phone]  Med List Status: <None>   Medication Order Taking? Sig Documenting Provider Last Dose Status Informant  amiodarone (PACERONE) 200 MG tablet [id number] Yes Take 1 tablet (200 mg total) by mouth daily.  Patient taking differently: Take 200 mg by mouth daily with lunch.   [name], MD Taking Active Self  aspirin 81 MG chewable tablet [id number] Yes Chew 1 tablet (81 mg total) by mouth daily.  Patient taking differently: Chew 81 mg by mouth daily with breakfast.   [name], MD Taking  Active   atorvastatin (LIPITOR) 10 MG tablet [id number] Yes Take 1 tablet (10 mg total) by mouth daily.  Patient taking differently: Take 10 mg by mouth daily with supper.   [name], MD Taking Active Self  BIDIL 20-37.5 MG tablet [id number] Yes TAKE ONE TABLET BY MOUTH EVERY MORNING and TAKE ONE TABLET BY MOUTH AT NOON and TAKE ONE TABLET BY MOUTH EVERY EVENING [name], MD Taking Active   Blood Glucose Monitoring Suppl (TRUE METRIX METER) w/Device Drucie Opitz [id number]  Use as directed [name], MD  Active Other  carvedilol (COREG) 3.125 MG tablet [id number] Yes TAKE ONE TABLET BY MOUTH EVERY MORNING and TAKE ONE TABLET EVERY EVENING WITH A MEAL [name], MD Taking Active   digoxin (LANOXIN) 0.125 MG tablet [id number] Yes Take 1 tablet (0.125 mg total) by mouth daily.  Patient taking differently: Take 0.125 mg by mouth daily with supper.   [name], MD Taking Active Self  empagliflozin (JARDIANCE) 10 MG TABS tablet [id number] Yes Take 1 tablet (10 mg total) by mouth daily.  Patient taking differently: Take 10 mg by mouth daily with lunch.   [name], MD Taking Active Self  ENTRESTO 24-26 [place] [id number] Yes TAKE ONE TABLET BY MOUTH EVERY MORNING and TAKE ONE TABLET BY MOUTH EVERY EVENING [name], MD Taking Active   ferrous sulfate 325 (65 FE) MG tablet [id number] Yes Take 1 tablet (325 mg total) by mouth daily with breakfast. [name], MD Taking Active Self  gabapentin (NEURONTIN) 300 MG capsule [id number] Yes TAKE ONE CAPSULE BY MOUTH THREE  TIMES DAILY [name], MD Taking Active   glucose blood (TRUE METRIX BLOOD GLUCOSE TEST) test strip [id number]  Use as instructed [name], MD  Active Other  metFORMIN (GLUCOPHAGE) 500 MG tablet [id number] Yes TAKE ONE TABLET BY MOUTH TWICE DAILY WITH A MEAL [name], MD Taking Active   nutrition supplement, [name]) PACK [id number]  Take 1 packet by mouth 2 (two) times daily  between meals. Persons, [name], [name]  Active   oxycodone (OXY-IR) 5 MG capsule [id number]  Take 1 capsule (5 mg total) by mouth every 6 (six) hours as needed. [name], NP  Active   potassium chloride SA (KLOR-CON) 20 MEQ tablet [id number] Yes TAKE ONE TABLET BY MOUTH EVERY MORNING [name], MD Taking Active   spironolactone (ALDACTONE) 25 MG tablet [id number] Yes TAKE ONE TABLET BY MOUTH EVERY MORNING [name], MD Taking Active   torsemide (DEMADEX) 20 MG tablet [id number]  Take 1 tablet (20 mg total) by mouth every morning. Needs appt for further refills [name], MD  Active   TRUEplus Lancets 28G MISC [id number]  Use as directed [name], MD  Active Other  Vitamin D, Ergocalciferol, (DRISDOL) 1.25 MG (50000 UNIT) CAPS capsule [id number] Yes TAKE ONE CAPSULE BY MOUTH ONCE WEEKLY ON [name], MD Taking Active   zinc sulfate 220 (50 Zn) MG capsule [id number] No Take 1 capsule (220 mg total) by mouth daily.  Patient not taking: Reported on 08/01/2021   Persons, [name], [name] Not Taking Active             Patient Active Problem List   Diagnosis Date Noted   S/P BKA (below knee amputation) bilateral ([place]) 07/18/2021   Acute hematogenous osteomyelitis of right foot ([place]) 04/26/2021   Gangrene of right foot (HCC)    Foot pain, right 04/14/2021   Subacute osteomyelitis, right ankle and foot ([place])    Status post below-knee amputation ([place]) 02/17/2021   Wound dehiscence    Gangrene of toe of left foot ([place])    Cutaneous abscess of left foot    Left foot infection 02/07/2021   Leukocytosis 02/07/2021   Thrombocytosis 02/07/2021   Hyponatremia 02/07/2021   AKI (acute kidney injury) ([place]) 02/07/2021   Hyperglycemia due to diabetes mellitus ([place]) 02/07/2021   Hypoalbuminemia due to protein-calorie malnutrition ([place]) 02/07/2021   Osteomyelitis of second toe of left foot ([place])    New onset of congestive heart failure ([place]) 12/26/2020   CKD  (chronic kidney disease) stage 3, GFR 30-59 ml/min (HCC) 24/82/5003   Acute systolic CHF (congestive heart failure) ([place]) 12/26/2020   Anemia, chronic disease 06/22/2020   Amputee, great toe, right ([place]) 06/20/2020   Normocytic anemia 06/20/2020   Erectile dysfunction associated with type 2 diabetes mellitus ([place]) 06/20/2020   Positive for macroalbuminuria 10/24/2019   Amputation of left great toe ([place]) 10/23/2019   Tobacco dependence 10/23/2019   Osteomyelitis of great toe of right foot ([place]) 06/04/2019   Diabetic foot infection ([place])    Noncompliance    Glaucoma    Hyperlipidemia    Essential hypertension 11/06/2003   Diabetes mellitus ([place]) 11/05/1997    Conditions to be addressed/monitored per PCP order:  CHF  Care Plan : RN Care Manager Plan of Care  Updates made by [name], RN since 10/18/2021 12:00 AM     Problem: Knowledge Deficits and Care Coordination needs related to long term management of CHF and HTN  Priority: High     Long-Range Goal: Development of Plan of Care to address Care Coordination needs and knowledge deficits related to CHF, HTN and SDOH   Start Date: 10/04/2021  Expected End Date: 01/02/2022  Priority: High  Note:   Current Barriers:  Knowledge Deficits related to plan of care for management of CHF and HTN  Care Coordination needs related to Housing barriers  Scott Greer was primarily focused on his housing barrier today. The [employer] allowed him a 10 day extension and now he has to be out of his apartment on 10/22/21. He is next on the list for a motel room, which is temporary housing until he can find an affordable apartment. Scott Greer continues to receive PCS and reports trying to take better care of himself. He has cut back on smoking to 2 cigars a day and not drinking alcohol.  RNCM Clinical Goal(s):  Patient will verbalize understanding of plan for management of CHF and HTN as evidenced by patient verbalization and self care activities take  all medications exactly as prescribed and will call provider for medication related questions as evidenced by documentation in EMR    attend all scheduled medical appointments: 10/19/21 for Eye Exam as evidenced by provider documentation in EMR        demonstrate improved adherence to prescribed treatment plan for CHF and HTN as evidenced by documentation in EMR work with Education officer, museum to address Housing barriers related to the management of CHF and HTN as evidenced by review of EMR and patient or Education officer, museum report     through collaboration with Consulting civil engineer, provider, and care team.   Interventions: Inter-disciplinary care team collaboration (see longitudinal plan of care) Evaluation of current treatment plan related to  self management and patient's adherence to plan as established by provider Collaborate with [name], BSW for financial insecurities and patient needing storage for household items in preparation for move Advised patient to schedule PT for assistance with wearing prosthetics   Heart Failure Interventions:  (Status: Goal on track: NO.)  Long Term Goal  Assessed social determinant of health barriers Advised patient to reschedule missed appointment with [name] on 10/06/21,  provided phone number and address to office  Hypertension: (Status: New goal.) Last practice recorded BP readings:  BP Readings from Last 3 Encounters:  07/18/21 120/79  05/06/21 119/88  05/02/21 113/66  Most recent eGFR/CrCl:  Lab Results  Component Value Date   EGFR 70 07/18/2021    No components found for: CRCL  Discussed plans with patient for ongoing care management follow up and provided patient with direct contact information for care management team; Assessed social determinant of health barriers;   Patient Goals/Self-Care Activities: Take medications as prescribed   Attend all scheduled provider appointments Call pharmacy for medication refills 3-7 days in advance of running out of  medications Call provider office for new concerns or questions        Follow Up:  Patient agrees to Care Plan and Follow-up.  Plan: The Managed Medicaid care management team will reach out to the patient again over the next 14 days.  Date/time of next scheduled RN care management/care coordination outreach:  11/02/21 @ 1:15pm  [name] RN, BSN [place] RN Care Coordinator

## 2021-10-18 NOTE — Patient Instructions (Signed)
Visit Information  Mr. Hellberg was given information about Medicaid Managed Care team care coordination services as a part of their [facility] Medicaid benefit. [name] verbally consented to engagement with the [facility] Managed Care team.   If you are experiencing a medical emergency, please call 911 or report to your local emergency department or urgent care.   If you have a non-emergency medical problem during routine business hours, please contact your provider's office and ask to speak with a nurse.   For questions related to your [facility] health plan, please call: [phone] or go here:[email or web address]  If you would like to schedule transportation through your [facility] plan, please call the following number at least 2 days in advance of your appointment: [phone].  Call the [place] at [phone], at any time, 24 hours a day, 7 days a week. If you are in danger or need immediate medical attention call 911.  If you would like help to quit smoking, call 1-800-QUIT-NOW (334)100-4483) OR Espaol: 1-855-Djelo-Ya (3-419-622-2979) o para ms informacin haga clic aqu or Text READY to 200-400 to register via text  Mr. Streeper - following are the goals we discussed in your visit today:   Goals Addressed   None     Please see education materials related to wound prevention provided as print materials.   The patient verbalized understanding of instructions provided today and declined a print copy of patient instruction materials.   Telephone follow up appointment with Managed Medicaid care management team member scheduled for:11/02/21 @ 1:15pm  [name] RN, BSN [place] RN Care Coordinator   Following is a copy of your plan of care:  Care Plan : RN Care Manager Plan of Care  Updates made by [name], RN since 10/18/2021 12:00 AM     Problem: Knowledge Deficits and Care  Coordination needs related to long term management of CHF and HTN   Priority: High     Long-Range Goal: Development of Plan of Care to address Care Coordination needs and knowledge deficits related to CHF, HTN and SDOH   Start Date: 10/04/2021  Expected End Date: 01/02/2022  Priority: High  Note:   Current Barriers:  Knowledge Deficits related to plan of care for management of CHF and HTN  Care Coordination needs related to Housing barriers  Mr. Barlowe was primarily focused on his housing barrier today. The [employer] allowed him a 10 day extension and now he has to be out of his apartment on 10/22/21. He is next on the list for a motel room, which is temporary housing until he can find an affordable apartment. Mr. Turnbough continues to receive PCS and reports trying to take better care of himself. He has cut back on smoking to 2 cigars a day and not drinking alcohol.  RNCM Clinical Goal(s):  Patient will verbalize understanding of plan for management of CHF and HTN as evidenced by patient verbalization and self care activities take all medications exactly as prescribed and will call provider for medication related questions as evidenced by documentation in EMR    attend all scheduled medical appointments: 10/19/21 for Eye Exam as evidenced by provider documentation in EMR        demonstrate improved adherence to prescribed treatment plan for CHF and HTN as evidenced by documentation in EMR work with social worker to address Housing barriers related to the management of CHF and HTN as evidenced by review of EMR and patient  or Education officer, museum report     through collaboration with Consulting civil engineer, provider, and care team.   Interventions: Inter-disciplinary care team collaboration (see longitudinal plan of care) Evaluation of current treatment plan related to  self management and patient's adherence to plan as established by provider Collaborate with [name], BSW for financial insecurities and patient  needing storage for household items in preparation for move Advised patient to schedule PT for assistance with wearing prosthetics   Heart Failure Interventions:  (Status: Goal on track: NO.)  Long Term Goal  Assessed social determinant of health barriers Advised patient to reschedule missed appointment with [name] on 10/06/21,  provided phone number and address to office  Hypertension: (Status: New goal.) Last practice recorded BP readings:  BP Readings from Last 3 Encounters:  07/18/21 120/79  05/06/21 119/88  05/02/21 113/66  Most recent eGFR/CrCl:  Lab Results  Component Value Date   EGFR 70 07/18/2021    No components found for: CRCL  Discussed plans with patient for ongoing care management follow up and provided patient with direct contact information for care management team; Assessed social determinant of health barriers;   Patient Goals/Self-Care Activities: Take medications as prescribed   Attend all scheduled provider appointments Call pharmacy for medication refills 3-7 days in advance of running out of medications Call provider office for new concerns or questions

## 2021-10-19 ENCOUNTER — Other Ambulatory Visit: Payer: Medicaid Other

## 2021-10-19 DIAGNOSIS — E1152 Type 2 diabetes mellitus with diabetic peripheral angiopathy with gangrene: Secondary | ICD-10-CM | POA: Diagnosis not present

## 2021-10-19 NOTE — Patient Instructions (Signed)
Visit Information  [name]  - as a part of your Medicaid benefit, you are eligible for care management and care coordination services at no cost or copay. I was unable to reach you by phone today but would be happy to help you with your health related needs. Please feel free to call me @ [phone].   A member of the Managed Medicaid care management team will reach out to you again over the next 7 days.   Gus Puma, BSW, [facility]   [employer] Risk Managed Medicaid Team  [phone]

## 2021-10-19 NOTE — Patient Outreach (Signed)
Care Coordination  10/19/2021  Scott Greer 07-Jun-1956 [id number]  Patient picked up phone but did not say anything. BSW called back and did not get an answer.  Gus Puma, BSW, [facility]   [employer] Risk Managed Medicaid Team  [phone]

## 2021-10-20 DIAGNOSIS — E1152 Type 2 diabetes mellitus with diabetic peripheral angiopathy with gangrene: Secondary | ICD-10-CM | POA: Diagnosis not present

## 2021-10-21 DIAGNOSIS — E1152 Type 2 diabetes mellitus with diabetic peripheral angiopathy with gangrene: Secondary | ICD-10-CM | POA: Diagnosis not present

## 2021-10-22 DIAGNOSIS — E1152 Type 2 diabetes mellitus with diabetic peripheral angiopathy with gangrene: Secondary | ICD-10-CM | POA: Diagnosis not present

## 2021-10-24 DIAGNOSIS — E1152 Type 2 diabetes mellitus with diabetic peripheral angiopathy with gangrene: Secondary | ICD-10-CM | POA: Diagnosis not present

## 2021-10-26 DIAGNOSIS — E1152 Type 2 diabetes mellitus with diabetic peripheral angiopathy with gangrene: Secondary | ICD-10-CM | POA: Diagnosis not present

## 2021-10-27 DIAGNOSIS — E1152 Type 2 diabetes mellitus with diabetic peripheral angiopathy with gangrene: Secondary | ICD-10-CM | POA: Diagnosis not present

## 2021-10-28 ENCOUNTER — Other Ambulatory Visit: Payer: Self-pay | Admitting: Internal Medicine

## 2021-10-28 ENCOUNTER — Other Ambulatory Visit (HOSPITAL_COMMUNITY): Payer: Self-pay | Admitting: Cardiology

## 2021-10-28 NOTE — Telephone Encounter (Signed)
Requested Prescriptions  Pending Prescriptions Disp Refills   FEROSUL 325 (65 Fe) MG tablet [Pharmacy Med Name: FeroSul 325 mg (65 mg iron) tablet] 100 tablet 1    Sig: TAKE ONE TABLET BY MOUTH EVERY MORNING WITH BREAKFAST     Endocrinology:  Minerals - Iron Supplementation Failed - 10/28/2021  7:59 AM      Failed - HGB in normal range and within 360 days    Hemoglobin  Date Value Ref Range Status  07/18/2021 11.6 (L) 13.0 - 17.7 g/dL Final   Total hemoglobin  Date Value Ref Range Status  01/02/2021 11.7 (L) 12.0 - 16.0 g/dL Final         Failed - HCT in normal range and within 360 days    Hematocrit  Date Value Ref Range Status  07/18/2021 34.0 (L) 37.5 - 51.0 % Final         Failed - RBC in normal range and within 360 days    RBC  Date Value Ref Range Status  07/18/2021 4.12 (L) 4.14 - 5.80 x10E6/uL Final  05/05/2021 2.67 (L) 4.22 - 5.81 MIL/uL Final         Failed - Fe (serum) in normal range and within 360 days    Iron  Date Value Ref Range Status  04/07/2021 29 (L) 38 - 169 ug/dL Final   Iron Saturation  Date Value Ref Range Status  04/07/2021 14 (L) 15 - 55 % Final         Passed - Ferritin in normal range and within 360 days    Ferritin  Date Value Ref Range Status  04/07/2021 272 30 - 400 ng/mL Final         Passed - Valid encounter within last 12 months    Recent Outpatient Visits          3 months ago Type 2 diabetes mellitus with other circulatory complications [facility])   [facility] Mellott, Gavin Pound B, MD   6 months ago Type 2 diabetes mellitus with other circulatory complications [facility])   [facility] Jonah Blue B, MD   7 months ago Type 2 diabetes mellitus with diabetic nephropathy, without long-term current use of insulin (HCC)   [facility] Jonah Blue B, MD   1 year ago Type 2 diabetes mellitus with diabetic nephropathy, without long-term current use  of insulin (HCC)   [facility] [name], MD   2 years ago Need for influenza vaccination   [facility] Lois Huxley, Cornelius Moras, RPH-CPP      Future Appointments            In 2 weeks [name], MD [facility]   In 1 month [name], Deloris Ping, MD [facility], [facility]

## 2021-11-02 ENCOUNTER — Other Ambulatory Visit: Payer: Self-pay | Admitting: *Deleted

## 2021-11-02 ENCOUNTER — Telehealth (HOSPITAL_COMMUNITY): Payer: Self-pay

## 2021-11-02 ENCOUNTER — Telehealth (HOSPITAL_COMMUNITY): Payer: Self-pay | Admitting: Licensed Clinical Social Worker

## 2021-11-02 ENCOUNTER — Other Ambulatory Visit (HOSPITAL_COMMUNITY): Payer: Self-pay

## 2021-11-02 ENCOUNTER — Other Ambulatory Visit: Payer: Self-pay

## 2021-11-02 DIAGNOSIS — E782 Mixed hyperlipidemia: Secondary | ICD-10-CM

## 2021-11-02 DIAGNOSIS — E1152 Type 2 diabetes mellitus with diabetic peripheral angiopathy with gangrene: Secondary | ICD-10-CM | POA: Diagnosis not present

## 2021-11-02 MED ORDER — ATORVASTATIN CALCIUM 10 MG PO TABS: 10.0000 mg | ORAL_TABLET | Freq: Every day | ORAL | 4 refills | Status: DC

## 2021-11-02 NOTE — Telephone Encounter (Signed)
CSW informed pt requesting that I reach out- he is now in motel program through [facility] so has stable housing until April.  CSW called pt to discuss finding more permanent housing- pt continues to work on finding housing alongside disability advocacy case worker that I referred him to.  CSW emailed case worker with the [employer] to see if there are any updates on his project based voucher program application- pt phone was off for 2 weeks so he was not getting voicemails or messages.  Will continue to follow and assist as needed  Burna Sis, LCSW Clinical Social Worker [facility] Desk#: [phone] Cell#: [phone]

## 2021-11-02 NOTE — Patient Outreach (Signed)
Medicaid Managed Care   Nurse Care Manager Note  11/02/2021 Name:  Scott Greer MRN:  [id number] DOB:  08-Nov-1955  Scott Greer is an 66 y.o. year old male who is a primary patient of Scott Pier, MD.  The [facility] Managed Care Coordination team was consulted for assistance with:    CHF HTN  Scott Greer was given information about Medicaid Managed Care Coordination team services today. Scott Greer Patient agreed to services and verbal consent obtained.  Engaged with patient by telephone for follow up visit in response to provider referral for case management and/or care coordination services.   Assessments/Interventions:  Review of past medical history, allergies, medications, health status, including review of consultants reports, laboratory and other test data, was performed as part of comprehensive evaluation and provision of chronic care management services.  SDOH (Social Determinants of Health) assessments and interventions performed: SDOH Interventions    Flowsheet Row Most Recent Value  SDOH Interventions   Food Insecurity Interventions Intervention Not Indicated  [Patient receiving 3 meals a day]  Transportation Interventions Intervention Not Indicated       Care Plan  Allergies  Allergen Reactions   Bee Venom Hives, Itching and Swelling    Medications Reviewed Today     Reviewed by Hughes Better, RPH-CPP (Pharmacist) on 10/13/21 at [phone]  Med List Status: <None>   Medication Order Taking? Sig Documenting Provider Last Dose Status Informant  amiodarone (PACERONE) 200 MG tablet [id number] Yes Take 1 tablet (200 mg total) by mouth daily.  Patient taking differently: Take 200 mg by mouth daily with lunch.   [name], MD Taking Active Self  aspirin 81 MG chewable tablet [id number] Yes Chew 1 tablet (81 mg total) by mouth daily.  Patient taking differently: Chew 81 mg by mouth daily with breakfast.   [name], MD Taking Active    atorvastatin (LIPITOR) 10 MG tablet [id number] Yes Take 1 tablet (10 mg total) by mouth daily.  Patient taking differently: Take 10 mg by mouth daily with supper.   [name], MD Taking Active Self  BIDIL 20-37.5 MG tablet [id number] Yes TAKE ONE TABLET BY MOUTH EVERY MORNING and TAKE ONE TABLET BY MOUTH AT NOON and TAKE ONE TABLET BY MOUTH EVERY EVENING [name], MD Taking Active   Blood Glucose Monitoring Suppl (TRUE METRIX METER) w/Device Drucie Opitz [id number]  Use as directed Scott Pier, MD  Active Other  carvedilol (COREG) 3.125 MG tablet [id number] Yes TAKE ONE TABLET BY MOUTH EVERY MORNING and TAKE ONE TABLET EVERY EVENING WITH A MEAL [name], MD Taking Active   digoxin (LANOXIN) 0.125 MG tablet [id number] Yes Take 1 tablet (0.125 mg total) by mouth daily.  Patient taking differently: Take 0.125 mg by mouth daily with supper.   [name], MD Taking Active Self  empagliflozin (JARDIANCE) 10 MG TABS tablet [id number] Yes Take 1 tablet (10 mg total) by mouth daily.  Patient taking differently: Take 10 mg by mouth daily with lunch.   [name], MD Taking Active Self  ENTRESTO 24-26 [place] [id number] Yes TAKE ONE TABLET BY MOUTH EVERY MORNING and TAKE ONE TABLET BY MOUTH EVERY EVENING [name], MD Taking Active   ferrous sulfate 325 (65 FE) MG tablet [id number] Yes Take 1 tablet (325 mg total) by mouth daily with breakfast. Scott Pier, MD Taking Active Self  gabapentin (NEURONTIN) 300 MG capsule [id number] Yes TAKE ONE CAPSULE BY MOUTH THREE TIMES DAILY  Scott Pier, MD Taking Active   glucose blood (TRUE METRIX BLOOD GLUCOSE TEST) test strip [id number]  Use as instructed Scott Pier, MD  Active Other  metFORMIN (GLUCOPHAGE) 500 MG tablet [id number] Yes TAKE ONE TABLET BY MOUTH TWICE DAILY WITH A MEAL Scott Pier, MD Taking Active   nutrition supplement, [name]) PACK [id number]  Take 1 packet by mouth 2 (two) times daily between  meals. Persons, [name], [name]  Active   oxycodone (OXY-IR) 5 MG capsule [id number]  Take 1 capsule (5 mg total) by mouth every 6 (six) hours as needed. [name], NP  Active   potassium chloride SA (KLOR-CON) 20 MEQ tablet [id number] Yes TAKE ONE TABLET BY MOUTH EVERY MORNING [name], MD Taking Active   spironolactone (ALDACTONE) 25 MG tablet [id number] Yes TAKE ONE TABLET BY MOUTH EVERY MORNING [name], MD Taking Active   torsemide (DEMADEX) 20 MG tablet [id number]  Take 1 tablet (20 mg total) by mouth every morning. Needs appt for further refills [name], MD  Active   TRUEplus Lancets 28G MISC [id number]  Use as directed Scott Pier, MD  Active Other  Vitamin D, Ergocalciferol, (DRISDOL) 1.25 MG (50000 UNIT) CAPS capsule [id number] Yes TAKE ONE CAPSULE BY MOUTH ONCE WEEKLY ON [name], MD Taking Active   zinc sulfate 220 (50 Zn) MG capsule [id number] No Take 1 capsule (220 mg total) by mouth daily.  Patient not taking: Reported on 08/01/2021   Persons, [name], [name] Not Taking Active             Patient Active Problem List   Diagnosis Date Noted   S/P BKA (below knee amputation) bilateral ([place]) 07/18/2021   Acute hematogenous osteomyelitis of right foot ([place]) 04/26/2021   Gangrene of right foot (HCC)    Foot pain, right 04/14/2021   Subacute osteomyelitis, right ankle and foot ([place])    Status post below-knee amputation ([place]) 02/17/2021   Wound dehiscence    Gangrene of toe of left foot ([place])    Cutaneous abscess of left foot    Left foot infection 02/07/2021   Leukocytosis 02/07/2021   Thrombocytosis 02/07/2021   Hyponatremia 02/07/2021   AKI (acute kidney injury) ([place]) 02/07/2021   Hyperglycemia due to diabetes mellitus ([place]) 02/07/2021   Hypoalbuminemia due to protein-calorie malnutrition ([place]) 02/07/2021   Osteomyelitis of second toe of left foot ([place])    New onset of congestive heart failure ([place]) 12/26/2020   CKD (chronic  kidney disease) stage 3, GFR 30-59 ml/min (HCC) 41/93/7902   Acute systolic CHF (congestive heart failure) ([place]) 12/26/2020   Anemia, chronic disease 06/22/2020   Amputee, great toe, right ([place]) 06/20/2020   Normocytic anemia 06/20/2020   Erectile dysfunction associated with type 2 diabetes mellitus ([place]) 06/20/2020   Positive for macroalbuminuria 10/24/2019   Amputation of left great toe ([address]) 10/23/2019   Tobacco dependence 10/23/2019   Osteomyelitis of great toe of right foot ([place]) 06/04/2019   Diabetic foot infection ([place])    Noncompliance    Glaucoma    Hyperlipidemia    Essential hypertension 11/06/2003   Diabetes mellitus ([place]) 11/05/1997    Conditions to be addressed/monitored per PCP order:  CHF and HTN  Care Plan : RN Care Manager Plan of Care  Updates made by [name], RN since 11/02/2021 12:00 AM     Problem: Knowledge Deficits and Care Coordination needs related to long term management of CHF and HTN  Priority: High     Long-Range Goal: Development of Plan of Care to address Care Coordination needs and knowledge deficits related to CHF, HTN and SDOH   Start Date: 10/04/2021  Expected End Date: 01/02/2022  Priority: High  Note:   Current Barriers:  Knowledge Deficits related to plan of care for management of CHF and HTN  Care Coordination needs related to Housing barriers  Mr. Fairbank is settled now in a Motel. He continues to look for an affordable apartment. He is working with "Doctor, general practice" from [employer]. He does receive 3 meals a day and reports having and taking all of his medications.  RNCM Clinical Goal(s):  Patient will verbalize understanding of plan for management of CHF and HTN as evidenced by patient verbalization and self care activities take all medications exactly as prescribed and will call provider for medication related questions as evidenced by documentation in EMR    attend all scheduled medical appointments: 11/17/21 with Dr.  Wynetta Emery, 11/20/21 with [facility] Pharmacist and 11/28/21 with [name] as evidenced by provider documentation in EMR        demonstrate improved adherence to prescribed treatment plan for CHF and HTN as evidenced by documentation in EMR work with social worker to address Housing barriers related to the management of CHF and HTN as evidenced by review of EMR and patient or Education officer, museum report     through collaboration with Consulting civil engineer, provider, and care team.   Interventions: Inter-disciplinary care team collaboration (see longitudinal plan of care) Evaluation of current treatment plan related to  self management and patient's adherence to plan as established by provider Mickle Mallory, LCSW for any updates for affordable housing, patient requesting a call   Advised patient to schedule PT for assistance with wearing prosthetics-revisited Discussed the importance of setting up a mailing address with [facility]   Heart Failure Interventions:  (Status: Goal on Track (progressing): YES.)  Long Term Goal  Assessed social determinant of health barriers Advised patient to attend appointment with [name] on 11/28/21,  provided phone number and address to office   Hypertension: (Status: Condition stable. Not addressed this visit.) Last practice recorded BP readings:  BP Readings from Last 3 Encounters:  07/18/21 120/79  05/06/21 119/88  05/02/21 113/66  Most recent eGFR/CrCl:  Lab Results  Component Value Date   EGFR 70 07/18/2021    No components found for: CRCL  Discussed plans with patient for ongoing care management follow up and provided patient with direct contact information for care management team; Assessed social determinant of health barriers;   Patient Goals/Self-Care Activities: Take medications as prescribed   Attend all scheduled provider appointments Call pharmacy for medication refills 3-7 days in advance of running out of medications Call provider office for new concerns or  questions        Follow Up:  Patient agrees to Care Plan and Follow-up.  Plan: The Managed Medicaid care management team will reach out to the patient again over the next 14 days.  Date/time of next scheduled RN care management/care coordination outreach:  11/16/21 @ 12:30pm  [name] RN, BSN [place] RN Care Coordinator

## 2021-11-02 NOTE — Patient Instructions (Signed)
Visit Information  Scott Greer was given information about Medicaid Managed Care team care coordination services as a part of their [facility] Medicaid benefit. [name] verbally consented to engagement with the [facility] Managed Care team.   If you are experiencing a medical emergency, please call 911 or report to your local emergency department or urgent care.   If you have a non-emergency medical problem during routine business hours, please contact your provider's office and ask to speak with a nurse.   For questions related to your [facility] health plan, please call: [phone] or go here:[email or web address]  If you would like to schedule transportation through your [facility] plan, please call the following number at least 2 days in advance of your appointment: [phone].  Call the [place] at [phone], at any time, 24 hours a day, 7 days a week. If you are in danger or need immediate medical attention call 911.  If you would like help to quit smoking, call 1-800-QUIT-NOW 701-245-9308) OR Espaol: 1-855-Djelo-Ya (1-791-505-6979) o para ms informacin haga clic aqu or Text READY to 200-400 to register via text  [name] - following are the goals we discussed in your visit today:   Goals Addressed   None     Please see education materials related to CHF and stump care provided as print materials.   The patient verbalized understanding of instructions provided today and agreed to receive a mailed copy of patient instruction and/or educational materials.  Telephone follow up appointment with Managed Medicaid care management team member scheduled for:11/16/21 @ 12:30pm  [name] RN, BSN [place] RN Care Coordinator   Following is a copy of your plan of care:  Care Plan : RN Care Manager Plan of Care  Updates made by [name], RN since 11/02/2021 12:00 AM     Problem:  Knowledge Deficits and Care Coordination needs related to long term management of CHF and HTN   Priority: High     Long-Range Goal: Development of Plan of Care to address Care Coordination needs and knowledge deficits related to CHF, HTN and SDOH   Start Date: 10/04/2021  Expected End Date: 01/02/2022  Priority: High  Note:   Current Barriers:  Knowledge Deficits related to plan of care for management of CHF and HTN  Care Coordination needs related to Housing barriers  [name] is settled now in a Motel. He continues to look for an affordable apartment. He is working with "Doctor, general practice" from [employer]. He does receive 3 meals a day and reports having and taking all of his medications.  RNCM Clinical Goal(s):  Patient will verbalize understanding of plan for management of CHF and HTN as evidenced by patient verbalization and self care activities take all medications exactly as prescribed and will call provider for medication related questions as evidenced by documentation in EMR    attend all scheduled medical appointments: 11/17/21 with Dr. Wynetta Emery, 11/20/21 with [facility] Pharmacist and 11/28/21 with [name] as evidenced by provider documentation in EMR        demonstrate improved adherence to prescribed treatment plan for CHF and HTN as evidenced by documentation in EMR work with social worker to address Housing barriers related to the management of CHF and HTN as evidenced by review of EMR and patient or Education officer, museum report     through collaboration with Consulting civil engineer, provider, and care team.   Interventions: Inter-disciplinary care team collaboration (see  longitudinal plan of care) Evaluation of current treatment plan related to  self management and patient's adherence to plan as established by provider Mickle Mallory, LCSW for any updates for affordable housing, patient requesting a call   Advised patient to schedule PT for assistance with wearing  prosthetics-revisited Discussed the importance of setting up a mailing address with [facility]   Heart Failure Interventions:  (Status: Goal on Track (progressing): YES.)  Long Term Goal  Assessed social determinant of health barriers Advised patient to attend appointment with [name] on 11/28/21,  provided phone number and address to office   Hypertension: (Status: Condition stable. Not addressed this visit.) Last practice recorded BP readings:  BP Readings from Last 3 Encounters:  07/18/21 120/79  05/06/21 119/88  05/02/21 113/66  Most recent eGFR/CrCl:  Lab Results  Component Value Date   EGFR 70 07/18/2021    No components found for: CRCL  Discussed plans with patient for ongoing care management follow up and provided patient with direct contact information for care management team; Assessed social determinant of health barriers;   Patient Goals/Self-Care Activities: Take medications as prescribed   Attend all scheduled provider appointments Call pharmacy for medication refills 3-7 days in advance of running out of medications Call provider office for new concerns or questions

## 2021-11-03 DIAGNOSIS — E1152 Type 2 diabetes mellitus with diabetic peripheral angiopathy with gangrene: Secondary | ICD-10-CM | POA: Diagnosis not present

## 2021-11-03 NOTE — Telephone Encounter (Signed)
error 

## 2021-11-05 DIAGNOSIS — Z419 Encounter for procedure for purposes other than remedying health state, unspecified: Secondary | ICD-10-CM | POA: Diagnosis not present

## 2021-11-06 DIAGNOSIS — E1152 Type 2 diabetes mellitus with diabetic peripheral angiopathy with gangrene: Secondary | ICD-10-CM | POA: Diagnosis not present

## 2021-11-07 ENCOUNTER — Telehealth: Payer: Self-pay | Admitting: Orthopedic Surgery

## 2021-11-07 ENCOUNTER — Other Ambulatory Visit: Payer: Self-pay | Admitting: Internal Medicine

## 2021-11-07 ENCOUNTER — Other Ambulatory Visit: Payer: Self-pay | Admitting: Family

## 2021-11-07 DIAGNOSIS — E559 Vitamin D deficiency, unspecified: Secondary | ICD-10-CM

## 2021-11-07 DIAGNOSIS — E1152 Type 2 diabetes mellitus with diabetic peripheral angiopathy with gangrene: Secondary | ICD-10-CM | POA: Diagnosis not present

## 2021-11-07 NOTE — Telephone Encounter (Signed)
Last filled 09/15/21 5mg  q6h prn #30. S/p Right BKA 04/2021. He has been doing PT upstairs to help with pain from the prosthetic.

## 2021-11-07 NOTE — Telephone Encounter (Signed)
Pt called stating for a CB in regards to some medications he was prescribed. He states someone else will be calling the office regarding the same info but he would like to speak with Denny Peon or Dr.Duda before they do.   [phone]

## 2021-11-08 DIAGNOSIS — E1152 Type 2 diabetes mellitus with diabetic peripheral angiopathy with gangrene: Secondary | ICD-10-CM | POA: Diagnosis not present

## 2021-11-08 NOTE — Telephone Encounter (Signed)
Called [facility] to inform and it went through on their end. Pt is informed.

## 2021-11-08 NOTE — Telephone Encounter (Signed)
I SW pt he says that [facility] is charging him for his medication and they never have charged him before. I explained to him this is not on our end, this is an insurance issue. I will call [facility] and see if there is a prior authorization needed or if something else we can help with.

## 2021-11-08 NOTE — Telephone Encounter (Signed)
Called [facility] and she says this does require a PA unless Rx was 7 d/s or less, otherwise insurance won't cover.

## 2021-11-08 NOTE — Telephone Encounter (Signed)
lmtcb

## 2021-11-08 NOTE — Telephone Encounter (Signed)
Pt returned call, the best call back number is [phone]

## 2021-11-08 NOTE — Telephone Encounter (Signed)
PA pending on [email or web address]

## 2021-11-08 NOTE — Telephone Encounter (Signed)
Oxycodone approved for #30 for 10 d/s. Only one time refill approval.

## 2021-11-09 DIAGNOSIS — E1152 Type 2 diabetes mellitus with diabetic peripheral angiopathy with gangrene: Secondary | ICD-10-CM | POA: Diagnosis not present

## 2021-11-10 DIAGNOSIS — E1152 Type 2 diabetes mellitus with diabetic peripheral angiopathy with gangrene: Secondary | ICD-10-CM | POA: Diagnosis not present

## 2021-11-11 DIAGNOSIS — E1152 Type 2 diabetes mellitus with diabetic peripheral angiopathy with gangrene: Secondary | ICD-10-CM | POA: Diagnosis not present

## 2021-11-15 DIAGNOSIS — E1152 Type 2 diabetes mellitus with diabetic peripheral angiopathy with gangrene: Secondary | ICD-10-CM | POA: Diagnosis not present

## 2021-11-16 ENCOUNTER — Other Ambulatory Visit: Payer: Self-pay | Admitting: *Deleted

## 2021-11-16 ENCOUNTER — Telehealth (HOSPITAL_COMMUNITY): Payer: Self-pay | Admitting: Licensed Clinical Social Worker

## 2021-11-16 NOTE — Patient Instructions (Signed)
Visit Information  Mr. Sinai L Ziff  - as a part of your Medicaid benefit, you are eligible for care management and care coordination services at no cost or copay. I was unable to reach you by phone today but would be happy to help you with your health related needs. Please feel free to call me @ [phone].   A member of the Managed Medicaid care management team will reach out to you again over the next 14 days.   Milbern Doescher RN, BSN [place]  [facility] RN Care Coordinator   

## 2021-11-16 NOTE — Patient Outreach (Signed)
Care Coordination  11/16/2021  MJ WILLIS 1956-08-28 [id number]   Medicaid Managed Care   Unsuccessful Outreach Note  11/16/2021 Name: TAYVIEN KANE MRN: [id number] DOB: 1956/03/25  Referred by: [name], MD Reason for referral : High Risk Managed Medicaid (Unsuccessful RNCM follow up telephone outreach)   An unsuccessful telephone outreach was attempted today. The patient was referred to the case management team for assistance with care management and care coordination.   Follow Up Plan: A HIPAA compliant phone message was left for the patient providing contact information and requesting a return call.   Estanislado Emms RN, BSN [place]   [facility]

## 2021-11-16 NOTE — Telephone Encounter (Signed)
CSW reached out to [employer] again to inquire about pt place on the [employer] program waitlist.  Awaiting response.  Attempted to call pt to get updates- pt unable to speak at this time  Will continue to follow and assist as needed  Burna Sis, LCSW Clinical Social Worker [facility] Desk#: [phone] Cell#: [phone]

## 2021-11-17 ENCOUNTER — Other Ambulatory Visit: Payer: Self-pay

## 2021-11-17 ENCOUNTER — Ambulatory Visit: Payer: Medicaid Other | Attending: Internal Medicine | Admitting: Internal Medicine

## 2021-11-17 DIAGNOSIS — Z5900 Homelessness unspecified: Secondary | ICD-10-CM | POA: Diagnosis not present

## 2021-11-17 DIAGNOSIS — I5042 Chronic combined systolic (congestive) and diastolic (congestive) heart failure: Secondary | ICD-10-CM | POA: Diagnosis not present

## 2021-11-17 DIAGNOSIS — E1159 Type 2 diabetes mellitus with other circulatory complications: Secondary | ICD-10-CM | POA: Diagnosis not present

## 2021-11-17 DIAGNOSIS — Z89512 Acquired absence of left leg below knee: Secondary | ICD-10-CM

## 2021-11-17 DIAGNOSIS — T8130XA Disruption of wound, unspecified, initial encounter: Secondary | ICD-10-CM | POA: Diagnosis not present

## 2021-11-17 DIAGNOSIS — L02612 Cutaneous abscess of left foot: Secondary | ICD-10-CM | POA: Diagnosis not present

## 2021-11-17 DIAGNOSIS — I96 Gangrene, not elsewhere classified: Secondary | ICD-10-CM | POA: Diagnosis not present

## 2021-11-17 DIAGNOSIS — Z89511 Acquired absence of right leg below knee: Secondary | ICD-10-CM | POA: Diagnosis not present

## 2021-11-17 DIAGNOSIS — F17200 Nicotine dependence, unspecified, uncomplicated: Secondary | ICD-10-CM | POA: Diagnosis not present

## 2021-11-17 DIAGNOSIS — E1152 Type 2 diabetes mellitus with diabetic peripheral angiopathy with gangrene: Secondary | ICD-10-CM | POA: Diagnosis not present

## 2021-11-17 DIAGNOSIS — L089 Local infection of the skin and subcutaneous tissue, unspecified: Secondary | ICD-10-CM | POA: Diagnosis not present

## 2021-11-17 NOTE — Progress Notes (Signed)
Patient ID: Scott Greer, male   DOB: 04/12/1956, 66 y.o.   MRN: [id number] Virtual Visit via Telephone Note  I connected with Scott Greer on 11/17/2021 at 1:35 PM by telephone and verified that I am speaking with the correct person using two identifiers  Location: Patient: home Provider: office  Participants: Myself Patient   I discussed the limitations, risks, security and privacy concerns of performing an evaluation and management service by telephone and the availability of in person appointments. I also discussed with the patient that there may be a patient responsible charge related to this service. The patient expressed understanding and agreed to proceed.   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  Last visit was 07/2021.  Today's visit is for chronic ds management.  Currently staying in a motel for past 2 wks.  His apartment was sold by owner; new owner increased rent and price was out of reach for him. Reports he has an advocate working with him in trying to get another apartment that he can afford.  I see the social worker and RN from [facility] have been keeping in contact with him via out reach phone calls.  DM:  not checking BS since moving to motel.  His testing supplies are in storage.  Gets 3 meals a day at the motel.  His last A1c was 6.  He is supposed to be on metformin and Jardiance.  He tells me that his prescriptions are delivered to him in blister packs and he has been receiving his medications and taking them. Referred to Dr. Katy Fitch on last visit. Pt reports his phone was not working for a while and he has issues with transportation.  He needs to get transportation set up for each appointment.  However he would like to get an eye exam due to his history of glaucoma.  BL BKA:  Complains of soreness in his stump when trying to wear his prosthetic legs.  This has been going on for a while.  He has been working with [name] with [employer].  According to Ortho's note from 09/15/2021, [employer] told him that he needed to begin wearing the prosthesis a little bit at a time.  He was supposed to start physical therapy the week after he saw orthopedics but he no showed that appointment.  Again he cites issues with transportation and not checking the voicemail on his phone.  He would like to get in with physical therapy.  HTN/CHF: He is supposed to be on BiDil, Coreg, Entresto, potassium supplement, digoxin, spironolactone, torsemide, amiodarone and Jardiance.  He is inquiring about getting in with the cardiologist but I see that he no showed appointment with [name] last month. -Denies chest pains, shortness of breath, dizziness or headaches.  No swelling in his BKA's.  Tobacco dependence: He continues to smoke cigars.  He does not want to quit at this time.  HM: He reports receiving Cologuard kit but states it is currently in storage.   Outpatient Encounter Medications as of 11/17/2021  Medication Sig   amiodarone (PACERONE) 200 MG tablet Take 1 tablet (200 mg total) by mouth daily.   aspirin (ASPIRIN LOW DOSE) 81 MG chewable tablet Chew 1 tablet (81 mg total) by mouth daily.   atorvastatin (LIPITOR) 10 MG tablet Take 1 tablet (10 mg  total) by mouth daily with supper.   BIDIL 20-37.5 MG tablet TAKE ONE TABLET BY MOUTH EVERY MORNING and TAKE ONE TABLET BY MOUTH AT NOON and TAKE ONE TABLET BY MOUTH EVERY EVENING   Blood Glucose Monitoring Suppl (TRUE METRIX METER) w/Device KIT Use as directed   carvedilol (COREG) 3.125 MG tablet TAKE ONE TABLET BY MOUTH EVERY MORNING and TAKE ONE TABLET EVERY EVENING WITH A MEAL   digoxin (LANOXIN) 0.125 MG tablet Take 1 tablet (0.125 mg total) by mouth daily with supper.   empagliflozin (JARDIANCE) 10 MG TABS tablet Take 1 tablet (10 mg total) by  mouth daily.   ENTRESTO 24-26 MG TAKE ONE TABLET BY MOUTH EVERY MORNING and TAKE ONE TABLET BY MOUTH EVERY EVENING   FEROSUL 325 (65 Fe) MG tablet TAKE ONE TABLET BY MOUTH EVERY MORNING WITH BREAKFAST   gabapentin (NEURONTIN) 300 MG capsule TAKE ONE CAPSULE BY MOUTH THREE TIMES DAILY   glucose blood (TRUE METRIX BLOOD GLUCOSE TEST) test strip Use as instructed   metFORMIN (GLUCOPHAGE) 500 MG tablet TAKE ONE TABLET BY MOUTH TWICE DAILY WITH A MEAL   nutrition supplement, JUVEN, (JUVEN) PACK Take 1 packet by mouth 2 (two) times daily between meals.   oxyCODONE (ROXICODONE) 5 MG immediate release tablet Take 1 tablet (5 mg total) by mouth every 8 (eight) hours as needed for severe pain.   potassium chloride SA (KLOR-CON) 20 MEQ tablet TAKE ONE TABLET BY MOUTH EVERY MORNING   spironolactone (ALDACTONE) 25 MG tablet TAKE ONE TABLET BY MOUTH EVERY MORNING   torsemide (DEMADEX) 20 MG tablet Take 1 tablet (20 mg total) by mouth every morning. Needs appt for further refills   TRUEplus Lancets 28G MISC Use as directed   Vitamin D, Ergocalciferol, (DRISDOL) 1.25 MG (50000 UNIT) CAPS capsule TAKE ONE CAPSULE BY MOUTH ONCE WEEKLY ON SUNDAY   zinc sulfate 220 (50 Zn) MG capsule Take 1 capsule (220 mg total) by mouth daily. (Patient not taking: Reported on 08/01/2021)   No facility-administered encounter medications on file as of 11/17/2021.      Observations/Objective: No direct observation done as this is a telephone encounter.  Assessment and Plan: 1. Type 2 diabetes mellitus with other circulatory complications [facility]) Patient will come to the lab sometime next week to have A1c drawn.  He will get his diabetic testing supplies when he returns to his storage unit.  I will have our referral coordinator check on his eye exam. - Hemoglobin A1c; Future - Comprehensive metabolic panel; Future  2. Chronic combined systolic and diastolic CHF (congestive heart failure) ([place]) I have resubmitted the referral for  the cardiologist.  I will touch base with his social worker or the RN from [facility] to see if they can assist with coordinating transportation to the appointment once he is given a date. Advised patient to continue his medications.  [facility] and apparently it is still being delivered to him. - Ambulatory referral to Cardiology  3. Tobacco dependence Strongly advised to quit.  Patient not interested in giving a trial of quitting.  4. S/P BKA (below knee amputation) bilateral ([place]) Will refer him to physical therapy again.  Again I will touch base with the social worker or RN from [facility] to see what assistance can be given with transportation. - Ambulatory referral to Physical Therapy  5. Homeless   Follow Up Instructions: 2 mths   I discussed the assessment and treatment plan with the patient.  The patient was provided an opportunity to ask questions and all were answered. The patient agreed with the plan and demonstrated an understanding of the instructions.   The patient was advised to call back or seek an in-person evaluation if the symptoms worsen or if the condition fails to improve as anticipated.  I  Spent 17 minutes on this telephone encounter  Karle Plumber, MD

## 2021-11-18 DIAGNOSIS — E1152 Type 2 diabetes mellitus with diabetic peripheral angiopathy with gangrene: Secondary | ICD-10-CM | POA: Diagnosis not present

## 2021-11-20 ENCOUNTER — Telehealth: Payer: Self-pay

## 2021-11-20 ENCOUNTER — Other Ambulatory Visit: Payer: Self-pay | Admitting: *Deleted

## 2021-11-20 ENCOUNTER — Other Ambulatory Visit: Payer: Self-pay

## 2021-11-20 DIAGNOSIS — E1152 Type 2 diabetes mellitus with diabetic peripheral angiopathy with gangrene: Secondary | ICD-10-CM | POA: Diagnosis not present

## 2021-11-20 NOTE — Patient Instructions (Signed)
Visit Information  Mr. Garcon was given information about Medicaid Managed Care team care coordination services as a part of their [facility] Medicaid benefit. [name] verbally consented to engagement with the [facility] Managed Care team.   If you are experiencing a medical emergency, please call 911 or report to your local emergency department or urgent care.   If you have a non-emergency medical problem during routine business hours, please contact your provider's office and ask to speak with a nurse.   For questions related to your [facility] health plan, please call: [phone] or go here:[email or web address]  If you would like to schedule transportation through your [facility] plan, please call the following number at least 2 days in advance of your appointment: [phone].  Call the [place] at [phone], at any time, 24 hours a day, 7 days a week. If you are in danger or need immediate medical attention call 911.  If you would like help to quit smoking, call 1-800-QUIT-NOW (830) 698-3435) OR Espaol: 1-855-Djelo-Ya (2-774-128-7867) o para ms informacin haga clic aqu or Text READY to 200-400 to register via text  Mr. Lantry,   Please see education materials related to constipation provided as print materials.   The patient verbalized understanding of instructions provided today and agreed to receive a mailed copy of patient instruction and/or educational materials.  Telephone follow up appointment with Managed Medicaid care management team member scheduled for:11/27/21 @ 11:15am  [name] RN, BSN [place] RN Care Coordinator   Following is a copy of your plan of care:  Care Plan : RN Care Manager Plan of Care  Updates made by [name], RN since 11/20/2021 12:00 AM     Problem: Knowledge Deficits and Care Coordination needs related to long term management of CHF and HTN    Priority: High     Long-Range Goal: Development of Plan of Care to address Care Coordination needs and knowledge deficits related to CHF, HTN and SDOH   Start Date: 10/04/2021  Expected End Date: 01/02/2022  Priority: High  Note:   Current Barriers:  Knowledge Deficits related to plan of care for management of CHF and HTN  Care Coordination needs related to Housing barriers  Mr. Barradas did not attend his appointment with PCP last week. He is planning to go to her office today to get set up with future appointments. Mr. Shein reports having all of his medications, but is only taking metformin, aspirin and a blood pressure medication due to constipation. RNCM was contacted by Dr. Hosie Poisson) to assist with arranging transportation to appointments and follow up on referrals.  RNCM Clinical Goal(s):  Patient will verbalize understanding of plan for management of CHF and HTN as evidenced by patient verbalization and self care activities take all medications exactly as prescribed and will call provider for medication related questions as evidenced by documentation in EMR    attend all scheduled medical appointments: 11/28/21 with [name] as evidenced by provider documentation in EMR        demonstrate improved adherence to prescribed treatment plan for CHF and HTN as evidenced by documentation in EMR work with social worker to address Housing barriers related to the management of CHF and HTN as evidenced by review of EMR and patient or Education officer, museum report     through collaboration with Consulting civil engineer, provider, and care team.   Interventions: Inter-disciplinary care team collaboration (see longitudinal plan of care) Evaluation of  current treatment plan related to  self management and patient's adherence to plan as established by provider Collaborated with PCP re: transportation   Advised patient to schedule PT for assistance with wearing prosthetics-revisited Chart updated with temporary  address RNCM arranged transportation via [employer] [phone], provided by [facility]; patient to be ready by 9:45 am on 11/28/2021, return ride scheduled for 12:30pm-Mr. Holte aware Medications reviewed, patient only taking 3 medications, RNCM instructed patient to take all medications with him to PCP office and all doctor's appointments Advised patient to discuss any concerns or questions with PCP Education on constipation provided and discussed    Heart Failure Interventions:  (Status: Goal on track: NO.)  Long Term Goal  Assessed social determinant of health barriers Advised patient to attend appointment with [name] on 11/28/21, transportation arranged, details provided    Hypertension: (Status: Condition stable. Not addressed this visit.) Last practice recorded BP readings:  BP Readings from Last 3 Encounters:  07/18/21 120/79  05/06/21 119/88  05/02/21 113/66  Most recent eGFR/CrCl:  Lab Results  Component Value Date   EGFR 70 07/18/2021    No components found for: CRCL  Discussed plans with patient for ongoing care management follow up and provided patient with direct contact information for care management team; Assessed social determinant of health barriers;   Patient Goals/Self-Care Activities: Take medications as prescribed   Attend all scheduled provider appointments Call pharmacy for medication refills 3-7 days in advance of running out of medications Call provider office for new concerns or questions  Take all medications to doctor's appointments

## 2021-11-20 NOTE — Telephone Encounter (Signed)
-----   Message from [name], MD sent at 11/17/2021  2:04 PM EST ----- Give follow-up appointment in 2 months.

## 2021-11-20 NOTE — Patient Outreach (Signed)
Medicaid Managed Care   Nurse Care Manager Note  11/20/2021 Name:  Scott Greer MRN:  [id number] DOB:  [birth year]  Scott Greer is an 66 y.o. year old male who is a primary patient of [name], MD.  The [facility] Managed Care Coordination team was consulted for assistance with:    DMII, bilat amputee  Scott Greer was given information about Medicaid Managed Care Coordination team services today. Scott Greer Patient agreed to services and verbal consent obtained.  Engaged with patient by telephone for follow up visit in response to provider referral for case management and/or care coordination services.   Assessments/Interventions:  Review of past medical history, allergies, medications, health status, including review of consultants reports, laboratory and other test data, was performed as part of comprehensive evaluation and provision of chronic care management services.  SDOH (Social Determinants of Health) assessments and interventions performed: SDOH Interventions    Flowsheet Row Most Recent Value  SDOH Interventions   Transportation Interventions Other (Comment)  [Medical transportation provided by [facility], [employer] [phone], RNCM assisted with arranging transporation for appointment with [name] on 11/28/21]       Care Plan  Allergies  Allergen Reactions   Bee Venom Hives, Itching and Swelling    Medications Reviewed Today     Reviewed by [name], RN (Registered Nurse) on 11/20/21 at 1358  Med List Status: <None>   Medication Order Taking? Sig Documenting Provider Last Dose Status Informant  amiodarone (PACERONE) 200 MG tablet [id number] No Take 1 tablet (200 mg total) by mouth daily. [name], MD Unknown Active   aspirin (ASPIRIN LOW DOSE) 81 MG chewable tablet [id number] Yes Chew 1 tablet (81 mg total) by mouth daily. [name], MD Taking Active   atorvastatin (LIPITOR) 10 MG tablet [id number] No Take 1 tablet (10 mg total)  by mouth daily with supper. [name], MD Unknown Active   BIDIL 20-37.5 MG tablet [id number] No TAKE ONE TABLET BY MOUTH EVERY MORNING and TAKE ONE TABLET BY MOUTH AT NOON and TAKE ONE TABLET BY MOUTH EVERY EVENING [name], MD Unknown Active   Blood Glucose Monitoring Suppl (TRUE METRIX METER) w/Device KIT [id number] No Use as directed  Patient not taking: Reported on 11/20/2021   [name], MD Not Taking Active Other  carvedilol (COREG) 3.125 MG tablet [id number] No TAKE ONE TABLET BY MOUTH EVERY MORNING and TAKE ONE TABLET EVERY EVENING WITH A MEAL [name], MD Unknown Active   digoxin (LANOXIN) 0.125 MG tablet [id number] No Take 1 tablet (0.125 mg total) by mouth daily with supper. [name], MD Unknown Active   empagliflozin (JARDIANCE) 10 MG TABS tablet [id number] No Take 1 tablet (10 mg total) by mouth daily. [name], MD Unknown Active   ENTRESTO 24-26 [place] [id number] No TAKE ONE TABLET BY MOUTH EVERY MORNING and TAKE ONE TABLET BY MOUTH EVERY EVENING [name], MD Unknown Active   FEROSUL 325 (65 Fe) MG tablet [id number] No TAKE ONE TABLET BY MOUTH EVERY MORNING WITH BREAKFAST [name], MD Unknown Active   gabapentin (NEURONTIN) 300 MG capsule [id number] No TAKE ONE CAPSULE BY MOUTH THREE TIMES DAILY  Patient not taking: Reported on 11/20/2021   [name], MD Not Taking Active   glucose blood (TRUE METRIX BLOOD GLUCOSE TEST) test strip [id number] No Use as instructed  Patient not taking: Reported on 11/20/2021   [name], MD Not Taking  Active Other  metFORMIN (GLUCOPHAGE) 500 MG tablet [id number] Yes TAKE ONE TABLET BY MOUTH TWICE DAILY WITH A MEAL [name], MD Taking Active   nutrition supplement, [name]) PACK [id number] No Take 1 packet by mouth 2 (two) times daily between meals.  Patient not taking: Reported on 11/20/2021   Persons, [name], [name] Not Taking Active   oxyCODONE (ROXICODONE) 5 MG  immediate release tablet [id number] Yes Take 1 tablet (5 mg total) by mouth every 8 (eight) hours as needed for severe pain. [name], NP Taking Active   potassium chloride SA (KLOR-CON) 20 MEQ tablet [id number] No TAKE ONE TABLET BY MOUTH EVERY MORNING [name], MD Unknown Active   spironolactone (ALDACTONE) 25 MG tablet [id number] No TAKE ONE TABLET BY MOUTH EVERY MORNING [name], MD Unknown Active   torsemide (DEMADEX) 20 MG tablet [id number] No Take 1 tablet (20 mg total) by mouth every morning. Needs appt for further refills [name], MD Unknown Active   TRUEplus Lancets 28G [place] [id number] No Use as directed  Patient not taking: Reported on 11/20/2021   [name], MD Not Taking Active Other  Vitamin D, Ergocalciferol, (DRISDOL) 1.25 MG (50000 UNIT) CAPS capsule [id number] No TAKE ONE CAPSULE BY MOUTH ONCE WEEKLY ON [name], MD Unknown Active   zinc sulfate 220 (50 Zn) MG capsule [id number] No Take 1 capsule (220 mg total) by mouth daily.  Patient not taking: Reported on 08/01/2021   Persons, [name], [name] Unknown Active             Patient Active Problem List   Diagnosis Date Noted   S/P BKA (below knee amputation) bilateral ([place]) 07/18/2021   Acute hematogenous osteomyelitis of right foot ([employer]) 04/26/2021   Gangrene of right foot (HCC)    Foot pain, right 04/14/2021   Subacute osteomyelitis, right ankle and foot ([place])    Status post below-knee amputation ([place]) 02/17/2021   Wound dehiscence    Gangrene of toe of left foot ([place])    Cutaneous abscess of left foot    Left foot infection 02/07/2021   Leukocytosis 02/07/2021   Thrombocytosis 02/07/2021   Hyponatremia 02/07/2021   AKI (acute kidney injury) ([place]) 02/07/2021   Hyperglycemia due to diabetes mellitus ([place]) 02/07/2021   Hypoalbuminemia due to protein-calorie malnutrition ([place]) 02/07/2021   Osteomyelitis of second toe of left foot ([place])    New onset of congestive heart  failure ([place]) 12/26/2020   CKD (chronic kidney disease) stage 3, GFR 30-59 ml/min (HCC) 02/63/7858   Acute systolic CHF (congestive heart failure) ([place]) 12/26/2020   Anemia, chronic disease 06/22/2020   Amputee, great toe, right ([place]) 06/20/2020   Normocytic anemia 06/20/2020   Erectile dysfunction associated with type 2 diabetes mellitus ([place]) 06/20/2020   Positive for macroalbuminuria 10/24/2019   Amputation of left great toe ([place]) 10/23/2019   Tobacco dependence 10/23/2019   Osteomyelitis of great toe of right foot ([place]) 06/04/2019   Diabetic foot infection ([place])    Noncompliance    Glaucoma    Hyperlipidemia    Essential hypertension 11/06/2003   Diabetes mellitus ([place]) 11/05/1997    Conditions to be addressed/monitored per PCP order:  DMII and bilat amputee  Care Plan : RN Care Manager Plan of Care  Updates made by [name], RN since 11/20/2021 12:00 AM     Problem: Knowledge Deficits and Care Coordination needs related to long term management of CHF and HTN  Priority: High     Long-Range Goal: Development of Plan of Care to address Care Coordination needs and knowledge deficits related to CHF, HTN and SDOH   Start Date: 10/04/2021  Expected End Date: 01/02/2022  Priority: High  Note:   Current Barriers:  Knowledge Deficits related to plan of care for management of CHF and HTN  Care Coordination needs related to Housing barriers  Mr. Dunne did not attend his appointment with PCP last week. He is planning to go to her office today to get set up with future appointments. Mr. Purdy reports having all of his medications, but is only taking metformin, aspirin and a blood pressure medication due to constipation. RNCM was contacted by Dr. Hosie Poisson) to assist with arranging transportation to appointments and follow up on referrals.  RNCM Clinical Goal(s):  Patient will verbalize understanding of plan for management of CHF and HTN as evidenced by patient verbalization  and self care activities take all medications exactly as prescribed and will call provider for medication related questions as evidenced by documentation in EMR    attend all scheduled medical appointments: 11/28/21 with [name] as evidenced by provider documentation in EMR        demonstrate improved adherence to prescribed treatment plan for CHF and HTN as evidenced by documentation in EMR work with social worker to address Housing barriers related to the management of CHF and HTN as evidenced by review of EMR and patient or Education officer, museum report     through collaboration with Consulting civil engineer, provider, and care team.   Interventions: Inter-disciplinary care team collaboration (see longitudinal plan of care) Evaluation of current treatment plan related to  self management and patient's adherence to plan as established by provider Collaborated with PCP re: transportation   Advised patient to schedule PT for assistance with wearing prosthetics-revisited Chart updated with temporary address RNCM arranged transportation via [employer] [phone], provided by [facility]; patient to be ready by 9:45 am on 11/28/2021, return ride scheduled for 12:30pm-Mr. [place] aware Medications reviewed, patient only taking 3 medications, RNCM instructed patient to take all medications with him to PCP office and all doctor's appointments Advised patient to discuss any concerns or questions with PCP Education on constipation provided and discussed    Heart Failure Interventions:  (Status: Goal on track: NO.)  Long Term Goal  Assessed social determinant of health barriers Advised patient to attend appointment with [name] on 11/28/21, transportation arranged, details provided    Hypertension: (Status: Condition stable. Not addressed this visit.) Last practice recorded BP readings:  BP Readings from Last 3 Encounters:  07/18/21 120/79  05/06/21 119/88  05/02/21 113/66  Most recent eGFR/CrCl:  Lab Results   Component Value Date   EGFR 70 07/18/2021    No components found for: CRCL  Discussed plans with patient for ongoing care management follow up and provided patient with direct contact information for care management team; Assessed social determinant of health barriers;   Patient Goals/Self-Care Activities: Take medications as prescribed   Attend all scheduled provider appointments Call pharmacy for medication refills 3-7 days in advance of running out of medications Call provider office for new concerns or questions  Take all medications to doctor's appointments      Follow Up:  Patient agrees to Care Plan and Follow-up.  Plan: The Managed Medicaid care management team will reach out to the patient again over the next 7 days.  Date/time of next scheduled RN care management/care coordination outreach:  11/27/21 @  11:15am  [name] RN, BSN [place] RN Care Coordinator

## 2021-11-20 NOTE — Telephone Encounter (Signed)
Pt has been scheduled and reminder has been mailed.  

## 2021-11-21 ENCOUNTER — Telehealth: Payer: Self-pay | Admitting: Internal Medicine

## 2021-11-21 DIAGNOSIS — E1152 Type 2 diabetes mellitus with diabetic peripheral angiopathy with gangrene: Secondary | ICD-10-CM | POA: Diagnosis not present

## 2021-11-21 NOTE — Telephone Encounter (Signed)
-----   Message from Heidi Dach, RN sent at 11/17/2021  4:20 PM EST ----- Hello Dr. Laural Benes,  Thank you for this update. I was unable to reach Mr. Stettler yesterday. When I last spoke with him 2 weeks ago, he shared with me that he is in temporary emergency housing. [facility] does not provide transportation, but I have instructed him on arranging transportation provided by his insurance. I will reach out to him next week and I will assist in arranging transportation to his appointment with Dr. Elease Hashimoto 11/28/21. I have advised him to reschedule PT-he wanted to get "settled and through the holidays."   Estanislado Emms RN, BSN [place]   [facility] RN Care Coordinator  ----- Message ----- From: [name], MD Sent: 11/17/2021   3:54 PM EST To: Heidi Dach, RN, Robyne Peers, RN  Hello Melanie.  I am the PCP for this patient.  I had a telephone visit with him today.  He has no- showed appointments for physical therapy, cardiology, Dr. Elease Hashimoto, and ophthalmology.  Major barrier is transportation issues and illiteracy.  He also gets messages on his phone and I am not sure that he understands how to access the messages.. Does [facility] offer any assistance with transportation? I have resubmitted referrals to cardiology and P.T. I have CCed our caseworker on this also.

## 2021-11-21 NOTE — Telephone Encounter (Signed)
-----   Message from Eden Lathe, RN sent at 11/20/2021  4:22 PM EST ----- Scott Greer,  Scott Greer has received a lot of support in the past to address multiple issues including transportation and housing.  Tammy Sours, LCSW from the [facility] has been very involved. He has also received [employer]. As you mentioned his insurance will provide transportation to medical appointments. I have advised him of this and instructed him to call this clinic if he has difficulty arranging a ride. We can set up [facility] [employer] for him but he really should try to access transportation through his insurance company first. Thanks, Management consultant   ----- Message ----- From: [name], RN Sent: 11/17/2021   4:28 PM EST To: Eden Lathe, RN, [name], MD  Hello Dr. Wynetta Emery,  Thank you for this update. I was unable to reach Scott Greer yesterday. When I last spoke with him 2 weeks ago, he shared with me that he is in temporary emergency housing. [facility] does not provide transportation, but I have instructed him on arranging transportation provided by his insurance. I will reach out to him next week and I will assist in arranging transportation to his appointment with [name] 11/28/21. I have advised him to reschedule PT-he wanted to get "settled and through the holidays."   [name] RN, [place] RN Care Coordinator  ----- Message ----- From: [name], MD Sent: 11/17/2021   3:54 PM EST To: [name], RN, Eden Lathe, RN  Hello Melanie.  I am the PCP for this patient.  I had a telephone visit with him today.  He has no- showed appointments for physical therapy, cardiology, [name], and ophthalmology.  Major barrier is transportation issues and illiteracy.  He also gets messages on his phone and I am not sure that he understands how to access the messages.. Does [facility] offer any assistance with transportation? I have  resubmitted referrals to cardiology and P.T. I have CCed our caseworker on this also.

## 2021-11-22 DIAGNOSIS — E1152 Type 2 diabetes mellitus with diabetic peripheral angiopathy with gangrene: Secondary | ICD-10-CM | POA: Diagnosis not present

## 2021-11-23 DIAGNOSIS — E1152 Type 2 diabetes mellitus with diabetic peripheral angiopathy with gangrene: Secondary | ICD-10-CM | POA: Diagnosis not present

## 2021-11-24 ENCOUNTER — Ambulatory Visit: Payer: Self-pay

## 2021-11-24 DIAGNOSIS — E1152 Type 2 diabetes mellitus with diabetic peripheral angiopathy with gangrene: Secondary | ICD-10-CM | POA: Diagnosis not present

## 2021-11-25 DIAGNOSIS — E1152 Type 2 diabetes mellitus with diabetic peripheral angiopathy with gangrene: Secondary | ICD-10-CM | POA: Diagnosis not present

## 2021-11-26 DIAGNOSIS — E1152 Type 2 diabetes mellitus with diabetic peripheral angiopathy with gangrene: Secondary | ICD-10-CM | POA: Diagnosis not present

## 2021-11-27 ENCOUNTER — Other Ambulatory Visit: Payer: Self-pay | Admitting: Internal Medicine

## 2021-11-27 ENCOUNTER — Other Ambulatory Visit: Payer: Self-pay

## 2021-11-27 ENCOUNTER — Other Ambulatory Visit (HOSPITAL_COMMUNITY): Payer: Self-pay | Admitting: Cardiology

## 2021-11-27 ENCOUNTER — Other Ambulatory Visit: Payer: Self-pay | Admitting: *Deleted

## 2021-11-27 DIAGNOSIS — E1152 Type 2 diabetes mellitus with diabetic peripheral angiopathy with gangrene: Secondary | ICD-10-CM | POA: Diagnosis not present

## 2021-11-27 DIAGNOSIS — E559 Vitamin D deficiency, unspecified: Secondary | ICD-10-CM

## 2021-11-27 DIAGNOSIS — E1121 Type 2 diabetes mellitus with diabetic nephropathy: Secondary | ICD-10-CM

## 2021-11-27 NOTE — Telephone Encounter (Signed)
Requested medication (s) are due for refill today:   Provider to review  Requested medication (s) are on the active medication list:   Yes for both  Future visit scheduled:   Yes    Seen 1 wk ago   Last ordered: metformin 09/20/2021 #60, 1 refill;   Vitamin D 08/03/2021 #4, 0 refills  Returned because non delegated refill.    Requested Prescriptions  Pending Prescriptions Disp Refills   metFORMIN (GLUCOPHAGE) 500 MG tablet [Pharmacy Med Name: metformin 500 mg tablet] 60 tablet 1    Sig: TAKE ONE TABLET BY MOUTH TWICE DAILY WITH A MEAL     Endocrinology:  Diabetes - Biguanides Failed - 11/27/2021  8:04 AM      Failed - AA eGFR in normal range and within 360 days    GFR calc Af Amer  Date Value Ref Range Status  12/14/2020 57 (L) >59 mL/min/1.73 Final    Comment:    **In accordance with recommendations from the NKF-ASN Task force,**   [facility] is in the process of updating its eGFR calculation to the   2021 CKD-EPI creatinine equation that estimates kidney function   without a race variable.    GFR, Estimated  Date Value Ref Range Status  05/05/2021 >60 >60 mL/min Final    Comment:    (NOTE) Calculated using the CKD-EPI Creatinine Equation (2021)    eGFR  Date Value Ref Range Status  07/18/2021 70 >59 mL/min/1.73 Final          Passed - Cr in normal range and within 360 days    Creatinine, Ser  Date Value Ref Range Status  07/18/2021 1.18 0.76 - 1.27 mg/dL Final          Passed - HBA1C is between 0 and 7.9 and within 180 days    HbA1c, POC (controlled diabetic range)  Date Value Ref Range Status  07/18/2021 6.0 0.0 - 7.0 % Final          Passed - Valid encounter within last 6 months    Recent Outpatient Visits           1 week ago Type 2 diabetes mellitus with other circulatory complications [facility])   [place], Neoma Laming B, MD   4 months ago Type 2 diabetes mellitus with other circulatory complications [facility])   [place] Karle Plumber B, MD   7 months ago Type 2 diabetes mellitus with other circulatory complications [facility])   [place] Karle Plumber B, MD   8 months ago Type 2 diabetes mellitus with diabetic nephropathy, without long-term current use of insulin ([place])   [place], Deborah B, MD   1 year ago Type 2 diabetes mellitus with diabetic nephropathy, without long-term current use of insulin ([place])   [place], MD       Future Appointments             Tomorrow [name], [name], MD [place], [facility]   In 1 month Wynetta Emery, Dalbert Batman, MD [place]             Vitamin D, Ergocalciferol, (DRISDOL) 1.25 MG (50000 UNIT) CAPS capsule [Pharmacy Med Name: Vitamin D2 1,250 mcg (50,000 unit) capsule] 5 capsule 0    Sig: TAKE ONE CAPSULE BY MOUTH ONCE WEEKLY ON SUNDAY  Endocrinology:  Vitamins - Vitamin D Supplementation Failed - 11/27/2021  8:04 AM      Failed - 50,000 IU strengths are not delegated      Failed - Vitamin D in normal range and within 360 days    Vit D, 25-Hydroxy  Date Value Ref Range Status  12/14/2020 9.2 (L) 30.0 - 100.0 ng/mL Final    Comment:    Vitamin D deficiency has been defined by the Institute of Medicine and an Endocrine Society practice guideline as a level of serum 25-OH vitamin D less than 20 ng/mL (1,2). The Endocrine Society went on to further define vitamin D insufficiency as a level between 21 and 29 ng/mL (2). 1. IOM (Institute of Medicine). 2010. Dietary reference    intakes for calcium and D. [place]: The    [employer]. 2. Holick MF, Binkley [place], Bischoff-Ferrari HA, et al.    Evaluation, treatment, and prevention of vitamin D    deficiency: an Endocrine Society clinical practice    guideline. JCEM. 2011 Jul; 96(7):1911-30.            Passed - Ca in normal range and within 360 days    Calcium  Date Value Ref Range Status  07/18/2021 10.1 8.6 - 10.2 mg/dL Final   Calcium, Ion  Date Value Ref Range Status  01/02/2021 1.15 1.15 - 1.40 mmol/L Final          Passed - Phosphate in normal range and within 360 days    Phosphorus  Date Value Ref Range Status  02/07/2021 3.2 2.5 - 4.6 mg/dL Final    Comment:    Performed at [facility] [address] [place], [place] [address]          Passed - Valid encounter within last 12 months    Recent Outpatient Visits           1 week ago Type 2 diabetes mellitus with other circulatory complications [facility])   [name] Karle Plumber B, MD   4 months ago Type 2 diabetes mellitus with other circulatory complications [facility])   [place] Karle Plumber B, MD   7 months ago Type 2 diabetes mellitus with other circulatory complications [facility])   [place] Karle Plumber B, MD   8 months ago Type 2 diabetes mellitus with diabetic nephropathy, without long-term current use of insulin ([place])   [place], Deborah B, MD   1 year ago Type 2 diabetes mellitus with diabetic nephropathy, without long-term current use of insulin ([place])   [place], MD       Future Appointments             Tomorrow [name], [name], MD [place], [facility]   In 1 month Wynetta Emery, Dalbert Batman, MD [place]

## 2021-11-27 NOTE — Patient Outreach (Signed)
Medicaid Managed Care   Nurse Care Manager Note  11/27/2021 Name:  Scott Greer MRN:  [id number] DOB:  June 21, 1956  Scott Greer is an 66 y.o. year old male who is a primary patient of Scott Pier, MD.  The [facility] Managed Care Coordination team was consulted for assistance with:    CHF  [name] was given information about Medicaid Managed Care Coordination team services today. Scott Greer Patient agreed to services and verbal consent obtained.  Engaged with patient by telephone for follow up visit in response to provider referral for case management and/or care coordination services.   Assessments/Interventions:  Review of past medical history, allergies, medications, health status, including review of consultants reports, laboratory and other test data, was performed as part of comprehensive evaluation and provision of chronic care management services.  SDOH (Social Determinants of Health) assessments and interventions performed:   Care Plan  Allergies  Allergen Reactions   Bee Venom Hives, Itching and Swelling    Medications Reviewed Today     Reviewed by [name], RN (Registered Nurse) on 11/20/21 at 1358  Med List Status: <None>   Medication Order Taking? Sig Documenting Provider Last Dose Status Informant  amiodarone (PACERONE) 200 MG tablet [id number] No Take 1 tablet (200 mg total) by mouth daily. [name], MD Unknown Active   aspirin (ASPIRIN LOW DOSE) 81 MG chewable tablet [id number] Yes Chew 1 tablet (81 mg total) by mouth daily. [name], MD Taking Active   atorvastatin (LIPITOR) 10 MG tablet [id number] No Take 1 tablet (10 mg total) by mouth daily with supper. [name], MD Unknown Active   BIDIL 20-37.5 MG tablet [id number] No TAKE ONE TABLET BY MOUTH EVERY MORNING and TAKE ONE TABLET BY MOUTH AT NOON and TAKE ONE TABLET BY MOUTH EVERY EVENING [name], MD Unknown Active   Blood Glucose Monitoring Suppl (TRUE METRIX  METER) w/Device KIT [id number] No Use as directed  Patient not taking: Reported on 11/20/2021   Scott Pier, MD Not Taking Active Other  carvedilol (COREG) 3.125 MG tablet [id number] No TAKE ONE TABLET BY MOUTH EVERY MORNING and TAKE ONE TABLET EVERY EVENING WITH A MEAL [name], MD Unknown Active   digoxin (LANOXIN) 0.125 MG tablet [id number] No Take 1 tablet (0.125 mg total) by mouth daily with supper. [name], MD Unknown Active   empagliflozin (JARDIANCE) 10 MG TABS tablet [id number] No Take 1 tablet (10 mg total) by mouth daily. [name], MD Unknown Active   ENTRESTO 24-26 [place] [id number] No TAKE ONE TABLET BY MOUTH EVERY MORNING and TAKE ONE TABLET BY MOUTH EVERY EVENING [name], MD Unknown Active   FEROSUL 325 (65 Fe) MG tablet [id number] No TAKE ONE TABLET BY MOUTH EVERY MORNING WITH BREAKFAST Scott Pier, MD Unknown Active   gabapentin (NEURONTIN) 300 MG capsule [id number] No TAKE ONE CAPSULE BY MOUTH THREE TIMES DAILY  Patient not taking: Reported on 11/20/2021   Scott Pier, MD Not Taking Active   glucose blood (TRUE METRIX BLOOD GLUCOSE TEST) test strip [id number] No Use as instructed  Patient not taking: Reported on 11/20/2021   Scott Pier, MD Not Taking Active Other  metFORMIN (GLUCOPHAGE) 500 MG tablet [id number] Yes TAKE ONE TABLET BY MOUTH TWICE DAILY WITH A MEAL Scott Pier, MD Taking Active   nutrition supplement, [name]) PACK [id number] No Take 1 packet by mouth 2 (two) times daily between meals.  Patient not taking: Reported on 11/20/2021   Persons, Scott Greer, [name] Not Taking Active   oxyCODONE (ROXICODONE) 5 MG immediate release tablet [id number] Yes Take 1 tablet (5 mg total) by mouth every 8 (eight) hours as needed for severe pain. [name], NP Taking Active   potassium chloride SA (KLOR-CON) 20 MEQ tablet [id number] No TAKE ONE TABLET BY MOUTH EVERY MORNING [name], MD Unknown Active   spironolactone  (ALDACTONE) 25 MG tablet [id number] No TAKE ONE TABLET BY MOUTH EVERY MORNING [name], MD Unknown Active   torsemide (DEMADEX) 20 MG tablet [id number] No Take 1 tablet (20 mg total) by mouth every morning. Needs appt for further refills [name], MD Unknown Active   TRUEplus Lancets 28G [place] [id number] No Use as directed  Patient not taking: Reported on 11/20/2021   Scott Pier, MD Not Taking Active Other  Vitamin D, Ergocalciferol, (DRISDOL) 1.25 MG (50000 UNIT) CAPS capsule [id number] No TAKE ONE CAPSULE BY MOUTH ONCE WEEKLY ON [name], MD Unknown Active   zinc sulfate 220 (50 Zn) MG capsule [id number] No Take 1 capsule (220 mg total) by mouth daily.  Patient not taking: Reported on 08/01/2021   Persons, Scott Greer, [name] Unknown Active             Patient Active Problem List   Diagnosis Date Noted   S/P BKA (below knee amputation) bilateral ([place]) 07/18/2021   Acute hematogenous osteomyelitis of right foot ([place]) 04/26/2021   Gangrene of right foot (HCC)    Foot pain, right 04/14/2021   Subacute osteomyelitis, right ankle and foot ([place])    Status post below-knee amputation ([place]) 02/17/2021   Wound dehiscence    Gangrene of toe of left foot ([place])    Cutaneous abscess of left foot    Left foot infection 02/07/2021   Leukocytosis 02/07/2021   Thrombocytosis 02/07/2021   Hyponatremia 02/07/2021   AKI (acute kidney injury) ([place]) 02/07/2021   Hyperglycemia due to diabetes mellitus ([place]) 02/07/2021   Hypoalbuminemia due to protein-calorie malnutrition ([place]) 02/07/2021   Osteomyelitis of second toe of left foot ([place])    New onset of congestive heart failure ([place]) 12/26/2020   CKD (chronic kidney disease) stage 3, GFR 30-59 ml/min (HCC) 28/76/8115   Acute systolic CHF (congestive heart failure) ([place]) 12/26/2020   Anemia, chronic disease 06/22/2020   Amputee, great toe, right ([place]) 06/20/2020   Normocytic anemia 06/20/2020   Erectile dysfunction  associated with type 2 diabetes mellitus ([place]) 06/20/2020   Positive for macroalbuminuria 10/24/2019   Amputation of left great toe ([place]) 10/23/2019   Tobacco dependence 10/23/2019   Osteomyelitis of great toe of right foot ([place]) 06/04/2019   Diabetic foot infection ([place])    Noncompliance    Glaucoma    Hyperlipidemia    Essential hypertension 11/06/2003   Diabetes mellitus ([place]) 11/05/1997    Conditions to be addressed/monitored per PCP order:  CHF  Care Plan : RN Care Manager Plan of Care  Updates made by [name], RN since 11/27/2021 12:00 AM     Problem: Knowledge Deficits and Care Coordination needs related to long term management of CHF and HTN   Priority: High     Long-Range Goal: Development of Plan of Care to address Care Coordination needs and knowledge deficits related to CHF, HTN and SDOH   Start Date: 10/04/2021  Expected End Date: 01/02/2022  Priority: High  Note:   Current Barriers:  Knowledge Deficits related  to plan of care for management of CHF and HTN  Care Coordination needs related to Housing barriers  Mr. Bolds is aware of his Cardiology appointment on 11/28/21. RNCM reviewed transportation details and expressed the importance of keeping this appointment.  RNCM Clinical Goal(s):  Patient will verbalize understanding of plan for management of CHF and HTN as evidenced by patient verbalization and self care activities take all medications exactly as prescribed and will call provider for medication related questions as evidenced by documentation in EMR    attend all scheduled medical appointments: 11/28/21 with [name] as evidenced by provider documentation in EMR        demonstrate improved adherence to prescribed treatment plan for CHF and HTN as evidenced by documentation in EMR work with social worker to address Housing barriers related to the management of CHF and HTN as evidenced by review of EMR and patient or Education officer, museum report     through  collaboration with Consulting civil engineer, provider, and care team.   Interventions: Inter-disciplinary care team collaboration (see longitudinal plan of care) Evaluation of current treatment plan related to  self management and patient's adherence to plan as established by provider   [facility] reviewed with patient- transportation via [employer] [phone], provided by [facility]; patient to be ready by 9:45 am on 11/28/2021, return ride scheduled for 12:30pm Encouraged patient to reschedule missed eye appointment,  [place] ph # [phone] address [place] [address] Reviewed upcoming appointments, discussed needing transportation to PT on 12/06/21, RNCM will reach out next week to assist in scheduling transportation   Heart Failure Interventions:  (Status: Goal on track: NO.)  Long Term Goal  Assessed social determinant of health barriers Advised patient to attend appointment with [name] on 11/28/21, transportation arranged, details provided    Hypertension: (Status: Condition stable. Not addressed this visit.) Last practice recorded BP readings:  BP Readings from Last 3 Encounters:  07/18/21 120/79  05/06/21 119/88  05/02/21 113/66  Most recent eGFR/CrCl:  Lab Results  Component Value Date   EGFR 70 07/18/2021    No components found for: CRCL  Discussed plans with patient for ongoing care management follow up and provided patient with direct contact information for care management team; Assessed social determinant of health barriers;   Patient Goals/Self-Care Activities: Take medications as prescribed   Attend all scheduled provider appointments Call pharmacy for medication refills 3-7 days in advance of running out of medications Call provider office for new concerns or questions  Take all medications to doctor's appointments      Follow Up:  Patient agrees to Care Plan and Follow-up.  Plan: The Managed Medicaid care management team will reach out to the patient again  over the next 7 days.  Date/time of next scheduled RN care management/care coordination outreach:  12/01/21 @ 11:15am  [name] RN, BSN [place] RN Care Coordinator

## 2021-11-27 NOTE — Patient Instructions (Signed)
Visit Information  Scott Greer was given information about Medicaid Managed Care team care coordination services as a part of their [facility] Medicaid benefit. Scott Greer verbally consented to engagement with the [facility] Managed Care team.   If you are experiencing a medical emergency, please call 911 or report to your local emergency department or urgent care.   If you have a non-emergency medical problem during routine business hours, please contact your provider's office and ask to speak with a nurse.   For questions related to your [facility] health plan, please call: [phone] or go here:[name]  If you would like to schedule transportation through your [facility] plan, please call the following number at least 2 days in advance of your appointment: [phone].  Call the [place] at [phone], at any time, 24 hours a day, 7 days a week. If you are in danger or need immediate medical attention call 911.  If you would like help to quit smoking, call 1-800-QUIT-NOW 562-604-1346) OR Espaol: 1-855-Djelo-Ya (3-383-291-9166) o para ms informacin haga clic aqu or Text READY to 200-400 to register via text  [name] - following are the goals we discussed in your visit today:   Goals Addressed   None     Please see education materials related to CHF and DM provided as print materials.   The patient verbalized understanding of instructions provided today and agreed to receive a mailed copy of patient instruction and/or educational materials.  Telephone follow up appointment with Managed Medicaid care management team member scheduled for:12/01/21 @ 11:15am  [name] RN, BSN [place] RN Care Coordinator   Following is a copy of your plan of care:  Care Plan : RN Care Manager Plan of Care  Updates made by [name], RN since 11/27/2021 12:00 AM     Problem: Knowledge  Deficits and Care Coordination needs related to long term management of CHF and HTN   Priority: High     Long-Range Goal: Development of Plan of Care to address Care Coordination needs and knowledge deficits related to CHF, HTN and SDOH   Start Date: 10/04/2021  Expected End Date: 01/02/2022  Priority: High  Note:   Current Barriers:  Knowledge Deficits related to plan of care for management of CHF and HTN  Care Coordination needs related to Housing barriers  [name] is aware of his Cardiology appointment on 11/28/21. RNCM reviewed transportation details and expressed the importance of keeping this appointment.  RNCM Clinical Goal(s):  Patient will verbalize understanding of plan for management of CHF and HTN as evidenced by patient verbalization and self care activities take all medications exactly as prescribed and will call provider for medication related questions as evidenced by documentation in EMR    attend all scheduled medical appointments: 11/28/21 with [name] as evidenced by provider documentation in EMR        demonstrate improved adherence to prescribed treatment plan for CHF and HTN as evidenced by documentation in EMR work with social worker to address Housing barriers related to the management of CHF and HTN as evidenced by review of EMR and patient or Education officer, museum report     through collaboration with Consulting civil engineer, provider, and care team.   Interventions: Inter-disciplinary care team collaboration (see longitudinal plan of care) Evaluation of current treatment plan related to  self management and patient's adherence to plan as established by provider   RNCM reviewed with patient-  transportation via [employer] [phone], provided by [facility]; patient to be ready by 9:45 am on 11/28/2021, return ride scheduled for 12:30pm Encouraged patient to reschedule missed eye appointment,  [place] ph # [phone] address [address] Reviewed upcoming  appointments, discussed needing transportation to PT on 12/06/21, RNCM will reach out next week to assist in scheduling transportation   Heart Failure Interventions:  (Status: Goal on track: NO.)  Long Term Goal  Assessed social determinant of health barriers Advised patient to attend appointment with [name] on 11/28/21, transportation arranged, details provided    Hypertension: (Status: Condition stable. Not addressed this visit.) Last practice recorded BP readings:  BP Readings from Last 3 Encounters:  07/18/21 120/79  05/06/21 119/88  05/02/21 113/66  Most recent eGFR/CrCl:  Lab Results  Component Value Date   EGFR 70 07/18/2021    No components found for: CRCL  Discussed plans with patient for ongoing care management follow up and provided patient with direct contact information for care management team; Assessed social determinant of health barriers;   Patient Goals/Self-Care Activities: Take medications as prescribed   Attend all scheduled provider appointments Call pharmacy for medication refills 3-7 days in advance of running out of medications Call provider office for new concerns or questions  Take all medications to doctor's appointments

## 2021-11-28 ENCOUNTER — Ambulatory Visit: Payer: Medicaid Other | Admitting: Cardiovascular Disease

## 2021-11-28 ENCOUNTER — Other Ambulatory Visit: Payer: Medicaid Other | Admitting: *Deleted

## 2021-11-28 ENCOUNTER — Other Ambulatory Visit: Payer: Self-pay

## 2021-11-28 DIAGNOSIS — E1152 Type 2 diabetes mellitus with diabetic peripheral angiopathy with gangrene: Secondary | ICD-10-CM | POA: Diagnosis not present

## 2021-11-28 NOTE — Patient Outreach (Signed)
Medicaid Managed Care   Nurse Care Manager Note  11/28/2021 Name:  Scott Greer MRN:  [id number] DOB:  [birth year]  Scott Greer is an 66 y.o. year old male who is a primary patient of [name], MD.  The [facility] Managed Care Coordination team was consulted for assistance with:    CHF  Scott Greer was given information about Medicaid Managed Care Coordination team services today. [name] Patient agreed to services and verbal consent obtained.  Engaged with patient by telephone for follow up visit in response to provider referral for case management and/or care coordination services.   Assessments/Interventions:  Review of past medical history, allergies, medications, health status, including review of consultants reports, laboratory and other test data, was performed as part of comprehensive evaluation and provision of chronic care management services.  SDOH (Social Determinants of Health) assessments and interventions performed:   Care Plan  Allergies  Allergen Reactions   Bee Venom Hives, Itching and Swelling    Medications Reviewed Today     Reviewed by [name], RN (Registered Nurse) on 11/20/21 at 1358  Med List Status: <None>   Medication Order Taking? Sig Documenting Provider Last Dose Status Informant  amiodarone (PACERONE) 200 MG tablet [id number] No Take 1 tablet (200 mg total) by mouth daily. [name], MD Unknown Active   aspirin (ASPIRIN LOW DOSE) 81 MG chewable tablet [id number] Yes Chew 1 tablet (81 mg total) by mouth daily. [name], MD Taking Active   atorvastatin (LIPITOR) 10 MG tablet [id number] No Take 1 tablet (10 mg total) by mouth daily with supper. [name], MD Unknown Active   BIDIL 20-37.5 MG tablet [id number] No TAKE ONE TABLET BY MOUTH EVERY MORNING and TAKE ONE TABLET BY MOUTH AT NOON and TAKE ONE TABLET BY MOUTH EVERY EVENING [name], MD Unknown Active   Blood Glucose Monitoring Suppl (TRUE METRIX  METER) w/Device KIT [id number] No Use as directed  Patient not taking: Reported on 11/20/2021   [name], MD Not Taking Active Other  carvedilol (COREG) 3.125 MG tablet [id number] No TAKE ONE TABLET BY MOUTH EVERY MORNING and TAKE ONE TABLET EVERY EVENING WITH A MEAL [name], MD Unknown Active   digoxin (LANOXIN) 0.125 MG tablet [id number] No Take 1 tablet (0.125 mg total) by mouth daily with supper. [name], MD Unknown Active   empagliflozin (JARDIANCE) 10 MG TABS tablet [id number] No Take 1 tablet (10 mg total) by mouth daily. [name], MD Unknown Active   ENTRESTO 24-26 [place] [id number] No TAKE ONE TABLET BY MOUTH EVERY MORNING and TAKE ONE TABLET BY MOUTH EVERY EVENING [name], MD Unknown Active   FEROSUL 325 (65 Fe) MG tablet [id number] No TAKE ONE TABLET BY MOUTH EVERY MORNING WITH BREAKFAST [name], MD Unknown Active   gabapentin (NEURONTIN) 300 MG capsule [id number] No TAKE ONE CAPSULE BY MOUTH THREE TIMES DAILY  Patient not taking: Reported on 11/20/2021   [name], MD Not Taking Active   glucose blood (TRUE METRIX BLOOD GLUCOSE TEST) test strip [id number] No Use as instructed  Patient not taking: Reported on 11/20/2021   [name], MD Not Taking Active Other  metFORMIN (GLUCOPHAGE) 500 MG tablet [id number] Yes TAKE ONE TABLET BY MOUTH TWICE DAILY WITH A MEAL [name], MD Taking Active   nutrition supplement, [name]) PACK [id number] No Take 1 packet by mouth 2 (two) times daily between meals.  Patient not taking: Reported on 11/20/2021   Persons, [name], [name] Not Taking Active   oxyCODONE (ROXICODONE) 5 MG immediate release tablet [id number] Yes Take 1 tablet (5 mg total) by mouth every 8 (eight) hours as needed for severe pain. [name], NP Taking Active   potassium chloride SA (KLOR-CON) 20 MEQ tablet [id number] No TAKE ONE TABLET BY MOUTH EVERY MORNING [name], MD Unknown Active   spironolactone  (ALDACTONE) 25 MG tablet [id number] No TAKE ONE TABLET BY MOUTH EVERY MORNING [name], MD Unknown Active   torsemide (DEMADEX) 20 MG tablet [id number] No Take 1 tablet (20 mg total) by mouth every morning. Needs appt for further refills [name], MD Unknown Active   TRUEplus Lancets 28G [place] [id number] No Use as directed  Patient not taking: Reported on 11/20/2021   [name], MD Not Taking Active Other  Vitamin D, Ergocalciferol, (DRISDOL) 1.25 MG (50000 UNIT) CAPS capsule [id number] No TAKE ONE CAPSULE BY MOUTH ONCE WEEKLY ON [name], MD Unknown Active   zinc sulfate 220 (50 Zn) MG capsule [id number] No Take 1 capsule (220 mg total) by mouth daily.  Patient not taking: Reported on 08/01/2021   Persons, [name], [name] Unknown Active             Patient Active Problem List   Diagnosis Date Noted   S/P BKA (below knee amputation) bilateral ([place]) 07/18/2021   Acute hematogenous osteomyelitis of right foot ([place]) 04/26/2021   Gangrene of right foot (HCC)    Foot pain, right 04/14/2021   Subacute osteomyelitis, right ankle and foot ([place])    Status post below-knee amputation ([place]) 02/17/2021   Wound dehiscence    Gangrene of toe of left foot ([place])    Cutaneous abscess of left foot    Left foot infection 02/07/2021   Leukocytosis 02/07/2021   Thrombocytosis 02/07/2021   Hyponatremia 02/07/2021   AKI (acute kidney injury) ([place]) 02/07/2021   Hyperglycemia due to diabetes mellitus ([place]) 02/07/2021   Hypoalbuminemia due to protein-calorie malnutrition ([place]) 02/07/2021   Osteomyelitis of second toe of left foot ([place])    New onset of congestive heart failure ([place]) 12/26/2020   CKD (chronic kidney disease) stage 3, GFR 30-59 ml/min (HCC) 73/41/9379   Acute systolic CHF (congestive heart failure) ([place]) 12/26/2020   Anemia, chronic disease 06/22/2020   Amputee, great toe, right ([employer]) 06/20/2020   Normocytic anemia 06/20/2020   Erectile dysfunction  associated with type 2 diabetes mellitus ([place]) 06/20/2020   Positive for macroalbuminuria 10/24/2019   Amputation of left great toe ([place]) 10/23/2019   Tobacco dependence 10/23/2019   Osteomyelitis of great toe of right foot ([place]) 06/04/2019   Diabetic foot infection ([place])    Noncompliance    Glaucoma    Hyperlipidemia    Essential hypertension 11/06/2003   Diabetes mellitus ([place]) 11/05/1997    Conditions to be addressed/monitored per PCP order:  CHF  Care Plan : RN Care Manager Plan of Care  Updates made by [name], RN since 11/28/2021 12:00 AM     Problem: Knowledge Deficits and Care Coordination needs related to long term management of CHF and HTN   Priority: High     Long-Range Goal: Development of Plan of Care to address Care Coordination needs and knowledge deficits related to CHF, HTN and SDOH   Start Date: 10/04/2021  Expected End Date: 01/02/2022  Priority: High  Note:   Current Barriers:  Knowledge Deficits related  to plan of care for management of CHF and HTN  Care Coordination needs related to Housing barriers  Mr. Fuster called RNCM today concerned because arranged transportation did not arrive to transport him to his scheduled appointment with [name].  RNCM Clinical Goal(s):  Patient will verbalize understanding of plan for management of CHF and HTN as evidenced by patient verbalization and self care activities take all medications exactly as prescribed and will call provider for medication related questions as evidenced by documentation in EMR    attend all scheduled medical appointments: 11/28/21 with [name] as evidenced by provider documentation in EMR        demonstrate improved adherence to prescribed treatment plan for CHF and HTN as evidenced by documentation in EMR work with Education officer, museum to address Housing barriers related to the management of CHF and HTN as evidenced by review of EMR and patient or Education officer, museum report     through  collaboration with Consulting civil engineer, provider, and care team.   Interventions: Inter-disciplinary care team collaboration (see longitudinal plan of care) Evaluation of current treatment plan related to  self management and patient's adherence to plan as established by provider   RNCM contacted transportation provided by [facility] and spoke with Nya-she was unable to find out why transportation did not pick up Mr. Certain and offered to schedule a pick up for today or tomorrow. RNCM calling Dr. Elmarie Shiley office to reschedule missed appointment-new appointment 12/01/21 @ 2:40pm RNCM arranged transportation for appointment with [name] 12/01/21 and PT on 12/06/21  Encouraged patient to reschedule missed eye appointment,  [place] ph # [phone] address [address] [address]   Heart Failure Interventions:  (Status: Goal on track: NO.)  Long Term Goal  Assessed social determinant of health barriers Advised patient to attend appointment with [name] on 12/01/21, transportation arranged, details provided    Hypertension: (Status: Condition stable. Not addressed this visit.) Last practice recorded BP readings:  BP Readings from Last 3 Encounters:  07/18/21 120/79  05/06/21 119/88  05/02/21 113/66  Most recent eGFR/CrCl:  Lab Results  Component Value Date   EGFR 70 07/18/2021    No components found for: CRCL  Discussed plans with patient for ongoing care management follow up and provided patient with direct contact information for care management team; Assessed social determinant of health barriers;   Patient Goals/Self-Care Activities: Take medications as prescribed   Attend all scheduled provider appointments Call pharmacy for medication refills 3-7 days in advance of running out of medications Call provider office for new concerns or questions  Take all medications to doctor's appointments      Follow Up:  Patient agrees to Care Plan and Follow-up.  Plan: The Managed  Medicaid care management team will reach out to the patient again over the next 14 days.  Date/time of next scheduled RN care management/care coordination outreach:  12/11/21 @ 10:30am  [name] RN, BSN [place] RN Care Coordinator

## 2021-11-28 NOTE — Patient Instructions (Signed)
Visit Information  [name] was given information about Medicaid Managed Care team care coordination services as a part of their [facility] Medicaid benefit. Scott Greer verbally consented to engagement with the [facility] Managed Care team.   If you are experiencing a medical emergency, please call 911 or report to your local emergency department or urgent care.   If you have a non-emergency medical problem during routine business hours, please contact your provider's office and ask to speak with a nurse.   For questions related to your [name] health plan, please call: [phone] or go here:[email or web address]  If you would like to schedule transportation through your [facility] plan, please call the following number at least 2 days in advance of your appointment: [phone].  Call the [place] at [phone], at any time, 24 hours a day, 7 days a week. If you are in danger or need immediate medical attention call 911.  If you would like help to quit smoking, call 1-800-QUIT-NOW 7121424634) OR Espaol: 1-855-Djelo-Ya (2-505-397-6734) o para ms informacin haga clic aqu or Text READY to 200-400 to register via text  Scott Greer,   Please see education materials related to heart failure provided as print materials.   The patient verbalized understanding of instructions provided today and agreed to receive a mailed copy of patient instruction and/or educational materials.  Telephone follow up appointment with Managed Medicaid care management team member scheduled for:12/11/21 @ 10:30am  [name] RN, BSN [place] RN Care Coordinator   Following is a copy of your plan of care:  Care Plan : RN Care Manager Plan of Care  Updates made by [name], RN since 11/28/2021 12:00 AM     Problem: Knowledge Deficits and Care Coordination needs related to long term management of CHF and HTN    Priority: High     Long-Range Goal: Development of Plan of Care to address Care Coordination needs and knowledge deficits related to CHF, HTN and SDOH   Start Date: 10/04/2021  Expected End Date: 01/02/2022  Priority: High  Note:   Current Barriers:  Knowledge Deficits related to plan of care for management of CHF and HTN  Care Coordination needs related to Housing barriers  Scott Greer called RNCM today concerned because arranged transportation did not arrive to transport him to his scheduled appointment with [name].  RNCM Clinical Goal(s):  Patient will verbalize understanding of plan for management of CHF and HTN as evidenced by patient verbalization and self care activities take all medications exactly as prescribed and will call provider for medication related questions as evidenced by documentation in EMR    attend all scheduled medical appointments: 11/28/21 with [name] as evidenced by provider documentation in EMR        demonstrate improved adherence to prescribed treatment plan for CHF and HTN as evidenced by documentation in EMR work with Education officer, museum to address Housing barriers related to the management of CHF and HTN as evidenced by review of EMR and patient or Education officer, museum report     through collaboration with Consulting civil engineer, provider, and care team.   Interventions: Inter-disciplinary care team collaboration (see longitudinal plan of care) Evaluation of current treatment plan related to  self management and patient's adherence to plan as established by provider   RNCM contacted transportation provided by [facility] and spoke with Nya-she was unable to find out why transportation did not pick up Scott Greer and offered  to schedule a pick up for today or tomorrow. RNCM calling Dr. Elmarie Shiley office to reschedule missed appointment-new appointment 12/01/21 @ 2:40pm RNCM arranged transportation for appointment with [name] 12/01/21 and PT on 12/06/21  Encouraged patient to  reschedule missed eye appointment,  [place] ph # [phone] address [place] [address]   Heart Failure Interventions:  (Status: Goal on track: NO.)  Long Term Goal  Assessed social determinant of health barriers Advised patient to attend appointment with [name] on 12/01/21, transportation arranged, details provided    Hypertension: (Status: Condition stable. Not addressed this visit.) Last practice recorded BP readings:  BP Readings from Last 3 Encounters:  07/18/21 120/79  05/06/21 119/88  05/02/21 113/66  Most recent eGFR/CrCl:  Lab Results  Component Value Date   EGFR 70 07/18/2021    No components found for: CRCL  Discussed plans with patient for ongoing care management follow up and provided patient with direct contact information for care management team; Assessed social determinant of health barriers;   Patient Goals/Self-Care Activities: Take medications as prescribed   Attend all scheduled provider appointments Call pharmacy for medication refills 3-7 days in advance of running out of medications Call provider office for new concerns or questions  Take all medications to doctor's appointments

## 2021-11-29 DIAGNOSIS — E1152 Type 2 diabetes mellitus with diabetic peripheral angiopathy with gangrene: Secondary | ICD-10-CM | POA: Diagnosis not present

## 2021-11-30 ENCOUNTER — Encounter: Payer: Self-pay | Admitting: Cardiovascular Disease

## 2021-11-30 DIAGNOSIS — E1152 Type 2 diabetes mellitus with diabetic peripheral angiopathy with gangrene: Secondary | ICD-10-CM | POA: Diagnosis not present

## 2021-11-30 NOTE — Progress Notes (Signed)
Cardiology Office Note:    Date:  12/01/2021   ID:  Scott Greer, DOB June 28, 1956, MRN [id number]  PCP:  Scott Pier, MD   [facility] Providers Cardiologist:  Scott Champagne, MD    . Scott Greer   Referring MD: Scott Pier, MD   Chief Complaint  Patient presents with   Congestive Heart Failure        Hyperlipidemia     History of Present Illness:    Scott Greer is a 65 y.o. male with a hx of CHF, DM, HTN. I met him in the hospital Feb. 2022 EF 25-30%, mildly - moderately elevated pulmonary HTN He was last seen by Dr. 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    Past Medical History:  Diagnosis Date   CHF (congestive heart failure) ([place])    Dehiscence of amputation stump ([place])    left great toe   Diabetes mellitus without complication ([place]) 1583   Type II   Glaucoma 2015   Hyperlipidemia    Hypertension 2005   Left foot infection 02/07/2021   Osteomyelitis ([place])    Wears glasses     Past Surgical History:  Procedure Laterality Date   AMPUTATION Left 06/05/2019   Procedure: LEFT GREAT TOE AMPUTATION;  Surgeon: Newt Minion, MD;  Location: [place];  Service: Orthopedics;  Laterality: Left;   AMPUTATION Left 07/10/2019   Procedure: LEFT FOOT 1ST RAY AMPUTATION;  Surgeon: Newt Minion, MD;  Location: [place];  Service: Orthopedics;  Laterality: Left;   AMPUTATION Right 05/25/2020   Procedure: RIGHT GREAT TOE AMPUTATION;  Surgeon: Newt Minion, MD;  Location: [place];  Service: Orthopedics;  Laterality: Right;   AMPUTATION Left 01/25/2021   Procedure: LEFT 2ND TOE AMPUTATION;  Surgeon: Newt Minion, MD;  Location: [place];   Service: Orthopedics;  Laterality: Left;   AMPUTATION Left 02/08/2021   Procedure: LEFT TRANSMETATARSAL AMPUTATION;  Surgeon: Newt Minion, MD;  Location: [place];  Service: Orthopedics;  Laterality: Left;   AMPUTATION Left 02/10/2021   Procedure: AMPUTATION BELOW KNEE;  Surgeon: Newt Minion, MD;  Location: [place];  Service: Orthopedics;  Laterality: Left;   AMPUTATION Right 04/14/2021   Procedure: RIGHT 1ST AND 2ND RAY AMPUTATION;  Surgeon: Newt Minion, MD;  Location: [place];  Service: Orthopedics;  Laterality: Right;   AMPUTATION Right 04/26/2021   Procedure: RIGHT BELOW KNEE AMPUTATION;  Surgeon: Newt Minion, MD;  Location: [place];  Service: Orthopedics;  Laterality: Right;   NO PAST SURGERIES     RIGHT/LEFT HEART CATH AND CORONARY ANGIOGRAPHY N/A 01/02/2021   Procedure: RIGHT/LEFT HEART CATH AND CORONARY ANGIOGRAPHY;  Surgeon: Lorretta Harp, MD;  Location: [place] [facility];  Service: Cardiovascular;  Laterality: N/A;    Current Medications: Current Meds  Medication Sig   amiodarone (PACERONE) 200 MG tablet Take 1 tablet (200 mg total) by mouth daily.   aspirin (ASPIRIN LOW DOSE) 81 MG chewable tablet Chew 1 tablet (81 mg total) by mouth daily.  BIDIL 20-37.5 MG tablet TAKE ONE TABLET BY MOUTH EVERY MORNING and TAKE ONE TABLET BY MOUTH AT NOON and TAKE ONE TABLET BY MOUTH EVERY EVENING   carvedilol (COREG) 3.125 MG tablet TAKE ONE TABLET BY MOUTH EVERY MORNING and TAKE ONE TABLET EVERY EVENING WITH A MEAL   digoxin (LANOXIN) 0.125 MG tablet Take 1 tablet (0.125 mg total) by mouth daily with supper.   empagliflozin (JARDIANCE) 10 MG TABS tablet Take 1 tablet (10 mg total) by mouth daily.   ENTRESTO 24-26 MG TAKE ONE TABLET BY MOUTH EVERY MORNING and TAKE ONE TABLET BY MOUTH EVERY EVENING   metFORMIN (GLUCOPHAGE) 500 MG tablet TAKE ONE TABLET BY MOUTH TWICE DAILY WITH A MEAL   potassium chloride SA (KLOR-CON) 20 MEQ tablet TAKE ONE TABLET BY MOUTH EVERY MORNING   spironolactone  (ALDACTONE) 25 MG tablet TAKE ONE TABLET BY MOUTH EVERY MORNING   torsemide (DEMADEX) 20 MG tablet TAKE ONE TABLET BY MOUTH every morning. needs appt FOR further refills     Allergies:   [place]ee venom   Social History   Socioeconomic History   Marital status: Legally Separated    Spouse name: Scott Greer   Number of children: 2   Years of education: Not on file   Highest education level: Associate degree: occupational, Hotel manager, or vocational program  Occupational History   Not on file  Tobacco Use   Smoking status: Every Day    Types: Cigars   Smokeless tobacco: Former    Types: Chew   Tobacco comments:    8 daily  Vaping Use   Vaping Use: Never used  Substance and Sexual Activity   Alcohol use: Yes    Alcohol/week: 11.0 standard drinks    Types: 3 Cans of beer, 8 Shots of liquor per week    Comment: occasional   Drug use: Yes    Types: Marijuana    Comment: ocassional - last time 05/08/2020   Sexual activity: Not Currently  Other Topics Concern   Not on file  Social History Narrative   Out of prison for 2 months.   Lives at home with his wife.   Social Determinants of Health   Financial Resource Strain: High Risk   Difficulty of Paying Living Expenses: Very hard  Food Insecurity: Landscape architect Present   Worried About Charity fundraiser in the Last Year: Sometimes true   Arboriculturist in the Last Year: Sometimes true  Transportation Needs: Public librarian (Medical): Yes   Lack of Transportation (Non-Medical): Yes  Physical Activity: Inactive   Days of Exercise per Week: 0 days   Minutes of Exercise per Session: 0 min  Stress: Not on file  Social Connections: Socially Isolated   Frequency of Communication with Friends and Family: Three times a week   Frequency of Social Gatherings with Friends and Family: Twice a week   Attends Religious Services: Never   Marine scientist or Organizations: No   Attends Programme researcher, broadcasting/film/video: Never   Marital Status: Divorced     Family History: The patient's family history includes Diabetes in his mother; Stroke in his mother.  ROS:   Please see the history of present illness.     All other systems reviewed and are negative.  EKGs/Labs/Other Studies Reviewed:    The following studies were reviewed today:   EKG:    Recent Labs: 12/25/2020: B Natriuretic Peptide 2,579.0 02/07/2021: Magnesium 1.8 05/05/2021: ALT  14 07/18/2021: BUN 17; Creatinine, Ser 1.18; Hemoglobin 11.6; Platelets 320; Potassium 4.0; Sodium 141  Recent Lipid Panel    Component Value Date/Time   CHOL 125 12/14/2020 1156   TRIG 90 12/14/2020 1156   HDL 61 12/14/2020 1156   CHOLHDL 2.0 12/14/2020 1156   LDLCALC 47 12/14/2020 1156     Risk Assessment/Calculations:           Physical Exam:    VS:  BP 132/78 (BP Location: Right Arm, Patient Position: Sitting, Cuff Size: Large)    Pulse 76    SpO2 99%     Wt Readings from Last 3 Encounters:  05/05/21 215 lb 13.3 oz (97.9 kg)  04/26/21 199 lb 15.3 oz (90.7 kg)  04/14/21 200 lb (90.7 kg)     GEN:  Well nourished, well developed in no acute distress HEENT: Normal NECK: No JVD; No carotid bruits LYMPHATICS: No lymphadenopathy CARDIAC: RRR, no murmurs, rubs, gallops RESPIRATORY:  Clear to auscultation without rales, wheezing or rhonchi  ABDOMEN: Soft, non-tender, non-distended MUSCULOSKELETAL:  bilat BKS  SKIN: Warm and dry NEUROLOGIC:  Alert and oriented x 3 PSYCHIATRIC:  Normal affect   ASSESSMENT:    1. Chronic combined systolic and diastolic CHF (congestive heart failure) (HCC)    PLAN:       Chronic systolic congestive heart failure: Marya Amsler is run out of all of his medications.  He was previously associated with the advanced heart failure clinic and communicated fairly regularly with the clinical social worker.  He was seen by Dr. Aundra Greer  in the hospital.  Given his social barriers and difficulty in getting and  keeping up with medications I think that he should be enrolled in the advanced heart failure clinic system.  I think this would help with his medication management.           Medication Adjustments/Labs and Tests Ordered: Current medicines are reviewed at length with the patient today.  Concerns regarding medicines are outlined above.  No orders of the defined types were placed in this encounter.  No orders of the defined types were placed in this encounter.   Patient Instructions  Medication Instructions:  Your physician recommends that you continue on your current medications as directed. Please refer to the Current Medication list given to you today.  *If you need a refill on your cardiac medications before your next appointment, please call your pharmacy*   Lab Work: None ordered  If you have labs (blood work) drawn today and your tests are completely normal, you will receive your results only by: [place] (if you have MyChart) OR A paper copy in the mail If you have any lab test that is abnormal or we need to change your treatment, we will call you to review the results.   Testing/Procedures: None ordered   Follow-Up: At [facility], you and your health needs are our priority.  As part of our continuing mission to provide you with exceptional heart care, we have created designated Provider Care Teams.  These Care Teams include your primary Cardiologist (physician) and Advanced Practice Providers (APPs -  Physician Assistants and Nurse Practitioners) who all work together to provide you with the care you need, when you need it.  We recommend signing up for the patient portal called "MyChart".  Sign up information is provided on this After Visit Summary.  MyChart is used to connect with patients for Virtual Visits (Telemedicine).  Patients are able to view lab/test results, encounter notes,  upcoming appointments, etc.  Non-urgent messages can be sent to your provider  as well.   To learn more about what you can do with MyChart, go to [email or web address].    Your next appointment:    The format for your next appointment:   In Person  Provider:   Heart Failure Clinic.. Someone will call you  If primary card or EP is not listed click here to update    :1}    Other Instructions     Signed, Mertie Moores, MD  12/01/2021 3:28 PM    [place]

## 2021-12-01 ENCOUNTER — Encounter: Payer: Self-pay | Admitting: Cardiovascular Disease

## 2021-12-01 ENCOUNTER — Ambulatory Visit (INDEPENDENT_AMBULATORY_CARE_PROVIDER_SITE_OTHER): Payer: Medicaid Other | Admitting: Cardiovascular Disease

## 2021-12-01 ENCOUNTER — Other Ambulatory Visit: Payer: Self-pay

## 2021-12-01 ENCOUNTER — Ambulatory Visit: Payer: Self-pay

## 2021-12-01 DIAGNOSIS — I5042 Chronic combined systolic (congestive) and diastolic (congestive) heart failure: Secondary | ICD-10-CM | POA: Diagnosis not present

## 2021-12-01 DIAGNOSIS — E1152 Type 2 diabetes mellitus with diabetic peripheral angiopathy with gangrene: Secondary | ICD-10-CM | POA: Diagnosis not present

## 2021-12-01 DIAGNOSIS — Z7689 Persons encountering health services in other specified circumstances: Secondary | ICD-10-CM | POA: Diagnosis not present

## 2021-12-01 DIAGNOSIS — I5032 Chronic diastolic (congestive) heart failure: Secondary | ICD-10-CM | POA: Insufficient documentation

## 2021-12-01 NOTE — Patient Instructions (Signed)
Medication Instructions:  Your physician recommends that you continue on your current medications as directed. Please refer to the Current Medication list given to you today.  *If you need a refill on your cardiac medications before your next appointment, please call your pharmacy*   Lab Work: None ordered  If you have labs (blood work) drawn today and your tests are completely normal, you will receive your results only by: [place] (if you have MyChart) OR A paper copy in the mail If you have any lab test that is abnormal or we need to change your treatment, we will call you to review the results.   Testing/Procedures: None ordered   Follow-Up: At [facility], you and your health needs are our priority.  As part of our continuing mission to provide you with exceptional heart care, we have created designated Provider Care Teams.  These Care Teams include your primary Cardiologist (physician) and Advanced Practice Providers (APPs -  Physician Assistants and Nurse Practitioners) who all work together to provide you with the care you need, when you need it.  We recommend signing up for the patient portal called "MyChart".  Sign up information is provided on this After Visit Summary.  MyChart is used to connect with patients for Virtual Visits (Telemedicine).  Patients are able to view lab/test results, encounter notes, upcoming appointments, etc.  Non-urgent messages can be sent to your provider as well.   To learn more about what you can do with MyChart, go to [email or web address].    Your next appointment:    The format for your next appointment:   In Person  Provider:   Heart Failure Clinic.. Someone will call you  If primary card or EP is not listed click here to update    :1}    Other Instructions

## 2021-12-02 DIAGNOSIS — E1152 Type 2 diabetes mellitus with diabetic peripheral angiopathy with gangrene: Secondary | ICD-10-CM | POA: Diagnosis not present

## 2021-12-04 DIAGNOSIS — E1152 Type 2 diabetes mellitus with diabetic peripheral angiopathy with gangrene: Secondary | ICD-10-CM | POA: Diagnosis not present

## 2021-12-04 DIAGNOSIS — F320 Major depressive disorder, single episode, mild: Secondary | ICD-10-CM | POA: Diagnosis not present

## 2021-12-05 ENCOUNTER — Other Ambulatory Visit: Payer: Self-pay | Admitting: Family

## 2021-12-05 DIAGNOSIS — E1152 Type 2 diabetes mellitus with diabetic peripheral angiopathy with gangrene: Secondary | ICD-10-CM | POA: Diagnosis not present

## 2021-12-05 DIAGNOSIS — F320 Major depressive disorder, single episode, mild: Secondary | ICD-10-CM | POA: Diagnosis not present

## 2021-12-06 ENCOUNTER — Ambulatory Visit: Payer: Medicaid Other | Attending: Orthopedic Surgery

## 2021-12-06 ENCOUNTER — Other Ambulatory Visit: Payer: Self-pay

## 2021-12-06 DIAGNOSIS — Z89511 Acquired absence of right leg below knee: Secondary | ICD-10-CM | POA: Insufficient documentation

## 2021-12-06 DIAGNOSIS — Z419 Encounter for procedure for purposes other than remedying health state, unspecified: Secondary | ICD-10-CM | POA: Diagnosis not present

## 2021-12-06 DIAGNOSIS — Z89512 Acquired absence of left leg below knee: Secondary | ICD-10-CM | POA: Diagnosis not present

## 2021-12-06 DIAGNOSIS — R2681 Unsteadiness on feet: Secondary | ICD-10-CM | POA: Insufficient documentation

## 2021-12-06 DIAGNOSIS — R2689 Other abnormalities of gait and mobility: Secondary | ICD-10-CM | POA: Insufficient documentation

## 2021-12-06 DIAGNOSIS — Z7689 Persons encountering health services in other specified circumstances: Secondary | ICD-10-CM | POA: Diagnosis not present

## 2021-12-06 DIAGNOSIS — E1152 Type 2 diabetes mellitus with diabetic peripheral angiopathy with gangrene: Secondary | ICD-10-CM | POA: Diagnosis not present

## 2021-12-06 NOTE — Therapy (Signed)
[facility] [address] [place], [place], [address] Phone: [phone]   Fax:  [phone]  Physical Therapy Evaluation  Patient Details  Name: Scott Greer MRN: [id number] Date of Birth: [birth year] Referring Provider (PT): Dr. Karle Plumber   Encounter Date: 12/06/2021   PT End of Session - 12/06/21 2006     Visit Number 1    Number of Visits 13    Date for PT Re-Evaluation 01/31/22    Authorization Type [employer] Medicaid (12 visits initially)    Progress Note Due on Visit 13    PT Start Time 1445    PT Stop Time 1600    PT Time Calculation (min) 75 min    Equipment Utilized During Treatment Gait belt    Activity Tolerance Patient tolerated treatment well    Behavior During Therapy [facility] for tasks assessed/performed             Past Medical History:  Diagnosis Date   CHF (congestive heart failure) ([place])    Dehiscence of amputation stump ([place])    left great toe   Diabetes mellitus without complication ([employer]) [id number]   Type II   Glaucoma 2015   Hyperlipidemia    Hypertension 2005   Left foot infection 02/07/2021   Osteomyelitis ([place])    Wears glasses     Past Surgical History:  Procedure Laterality Date   AMPUTATION Left 06/05/2019   Procedure: LEFT GREAT TOE AMPUTATION;  Surgeon: Newt Minion, MD;  Location: [facility];  Service: Orthopedics;  Laterality: Left;   AMPUTATION Left 07/10/2019   Procedure: LEFT FOOT 1ST RAY AMPUTATION;  Surgeon: Newt Minion, MD;  Location: [facility];  Service: Orthopedics;  Laterality: Left;   AMPUTATION Right 05/25/2020   Procedure: RIGHT GREAT TOE AMPUTATION;  Surgeon: Newt Minion, MD;  Location: [facility];  Service: Orthopedics;  Laterality: Right;   AMPUTATION Left 01/25/2021   Procedure: LEFT 2ND TOE AMPUTATION;  Surgeon: Newt Minion, MD;  Location: [facility];  Service: Orthopedics;  Laterality: Left;   AMPUTATION Left 02/08/2021   Procedure: LEFT TRANSMETATARSAL AMPUTATION;   Surgeon: Newt Minion, MD;  Location: [facility];  Service: Orthopedics;  Laterality: Left;   AMPUTATION Left 02/10/2021   Procedure: AMPUTATION BELOW KNEE;  Surgeon: Newt Minion, MD;  Location: [facility];  Service: Orthopedics;  Laterality: Left;   AMPUTATION Right 04/14/2021   Procedure: RIGHT 1ST AND 2ND RAY AMPUTATION;  Surgeon: Newt Minion, MD;  Location: [facility];  Service: Orthopedics;  Laterality: Right;   AMPUTATION Right 04/26/2021   Procedure: RIGHT BELOW KNEE AMPUTATION;  Surgeon: Newt Minion, MD;  Location: [facility];  Service: Orthopedics;  Laterality: Right;   NO PAST SURGERIES     RIGHT/LEFT HEART CATH AND CORONARY ANGIOGRAPHY N/A 01/02/2021   Procedure: RIGHT/LEFT HEART CATH AND CORONARY ANGIOGRAPHY;  Surgeon: Lorretta Harp, MD;  Location: [facility];  Service: Cardiovascular;  Laterality: N/A;    There were no vitals filed for this visit.    Subjective Assessment - 12/06/21 1943     Subjective Pt reports he had left BKA on 02/10/21 and R BKA on 04/26/21. He received his prosthetic legs end of November, 2022. He has only tried to put on his prosthetic legs 2x since he received his legs. He has not been wearing it due to increased pain in his residual legs. Pt reports recently his pain is better then before but he has not  tried his legs at home. He uses wheelchair for mobility. He works with [name] at [employer] but has not seen him since his receipts of his prosthetic legs. Pt reports he was moved out of his apartment recently into motel room. All of his stuff is in storage room. He had extra pair of socks which are in storage container but he can't access it since he is in wheelchair and he has no help at home. Pt currently is mobile at home in his wheelchair. Pt reports issues with urinary urgency and incontinence lately.    Limitations Standing;Walking;House hold activities;Lifting    How long can you sit comfortably? no issues    How long can you stand comfortably? not tried it     How long can you walk comfortably? not tried it    Patient Stated Goals walk with prosthetic legs    Currently in Pain? Yes    Pain Score 7     Pain Location Leg    Pain Orientation Left;Right    Pain Descriptors / Indicators Sharp    Pain Type Phantom pain    Pain Onset More than a month ago    Pain Frequency Intermittent    Aggravating Factors  Wearing prosthetic leg, WB    Pain Relieving Factors removing prosthetic leg                [facility] PT Assessment - 12/06/21 1948       Assessment   Medical Diagnosis B BKA    Referring Provider (PT) Dr. Karle Plumber    Onset Date/Surgical Date 10/01/21   Received prosthetic legs     Precautions   Precautions Fall      Restrictions   Weight Bearing Restrictions No      Balance Screen   Has the patient fallen in the past 6 months No      [place] Other (Comment)    Additional Comments Motel      Prior Function   Level of Independence Independent with basic ADLs;Needs assistance with gait;Needs assistance with transfers    Vocation On disability      Cognition   Overall Cognitive Status Within Functional Limits for tasks assessed      Observation/Other Assessments   Skin Integrity Incisions are well healed, dry without any blisters on residual legs bil      ROM / Strength   AROM / PROM / Strength Strength      Strength   Overall Strength Within functional limits for tasks performed      Transfers   Transfers Lateral/Scoot Transfers;Sit to Stand;Stand to Sit    Sit to Stand 4: Min assist;With upper extremity assist;From bed;Other (comment)   with walker   Stand to Sit 4: Min guard;With upper extremity assist;To elevated surface    Lateral/Scoot Transfers 7: Independent      Ambulation/Gait   Ambulation/Gait Yes    Ambulation/Gait Assistance 4: Min assist    Ambulation/Gait Assistance Details Bariatric wlaker    Ambulation Distance (Feet) 20 Feet    Assistive device Rolling walker     Gait Pattern Decreased step length - right;Decreased step length - left;Decreased stance time - right;Decreased stance time - left;Decreased stride length;Decreased hip/knee flexion - right;Decreased hip/knee flexion - left;Trunk flexed;Wide base of support    Ambulation Surface Level;Indoor      Standardized Balance Assessment   Standardized Balance Assessment Timed Up and Go Test      Timed Up  and Go Test   Normal TUG (seconds) 1.06   with min a and RW            Prosthetic education: - clean residual leg and gel sleeve with antibacterla soap and water and let the gel sleeve air dry. Use the second pair next day - Can use lotion on residual leg at night time but has to clean the lotion before putting gel sleeve on in the morning - continue to wear shrinker at night  Pt educated on listening for 9-13 clicks when he puts on his prosthetic leg and not bottoming out with 16-17 clicks. Patient educated on importance of having correct number of ply socks for proper fit, comfort and preventing socket from spinning during gait.   Pt educated on calling Michael at [facility] and making appt as soon as he can as he needs thinner foam liner and extra ply socks. We were unable to get either of his prosthetic legs on his legs with gel liners in. With removal of foam liner in prosthetic leg, we were able to get prosthetic legs in but it was bottoming out even with 3 ply socks that patient had with him for each leg.   Pt's wheelchair's brakes were not working. Rehab aide and I tried to fix it and we got the brakes tighter where they were functioning properly to improve patient's safety with lateral scoot transfers at home.  Pt was educated to try putting on gel sleeve for 1 hour prior to trying to put on his prosthetic leg to reduce the residual limb size to improve ease of putting it on at home.            Objective measurements completed on examination: See above findings.                   PT Short Term Goals - 12/06/21 1955       PT SHORT TERM GOAL #1   Title Patient will verbalize proper ply sock adjustment to accomodate residual limb changes throughout the day    Baseline Education initiated (eval)    Time 4    Period Weeks    Status New    Target Date 12/20/21      PT SHORT TERM GOAL #2   Title Pt will be able to verbalize how to properly care of prosthetic components and residual leg to maintain best hygenic practic along with sweat management    Baseline Educated initiated (Eval)    Time 4    Period Weeks    Status New    Target Date 01/03/22      PT SHORT TERM GOAL #3   Title Patient will be able to ambulate 115' with RW and CGA/min A to improve in home mobility    Baseline pt has not walked at home (Eval)    Time 4    Period Weeks    Status New    Target Date 01/03/22               PT Long Term Goals - 12/06/21 1958       PT LONG TERM GOAL #1   Title Patient will demo TUG of <30 seconds with RW to improve functional mobility    Baseline 1 min 6 sec (eval)    Period Weeks    Status New    Target Date 01/31/22      PT LONG TERM GOAL #2   Title Patient will demo 0.15  m/s improvement in his gait speed to improve functional mobility and reduce fall risk    Baseline TBD    Time 8    Period Weeks    Status New    Target Date 01/31/22      PT LONG TERM GOAL #3   Title Patient will be able to ambulate >400 feet with RW to improve ability to walk from transport van to [employer] with his RW and reduce reliance on wheelchair.    Baseline Not attempted (eval)    Time 8    Period Weeks    Status New    Target Date 01/31/22                    Plan - 12/06/21 2000     Clinical Impression Statement Patient is a 66 y.o. male who was seen today for physical therapy evaluation and treatment for gait and mobility disorder after s/p bil BKA. Patient requires further education regarding proper prosthetic  component care, residual leg care, graudal increaes in wear time for prosthetic leg and pain management. Patient demonstrates significantly limited functional mobility with RW and requires min A with ambulation. Patient will benefit from skilled PT to address his functional short term and long term goals and improve independnece.    Personal Factors and Comorbidities Comorbidity 2;Time since onset of injury/illness/exacerbation;Transportation    Comorbidities DM, bil BKA    Examination-Activity Limitations Bathing;Carry;Continence;Hygiene/Grooming;Squat;Stairs;Stand;Toileting;Transfers    Examination-Participation Restrictions Cleaning;Community Activity;Laundry;Meal Prep;Shop    Stability/Clinical Decision Making Stable/Uncomplicated    Clinical Decision Making Low    Rehab Potential Good    PT Frequency Other (comment)   1-2x/week   PT Duration 8 weeks    PT Treatment/Interventions ADLs/Self Care Home Management;Cryotherapy;Moist Heat;Gait training;Stair training;Functional mobility training;Therapeutic activities;Therapeutic exercise;Balance training;Manual techniques;Wheelchair mobility training;Prosthetic Training;Patient/family education;Neuromuscular re-education;Scar mobilization;Passive range of motion;Energy conservation;Joint Manipulations    PT Next Visit Plan Does he have walker at home? If not we need get physician order and start the process. Did he call Legrand Como at [facility] and make an appointment to evaluate prosthesis? We were unable to put on his prosthetic legs with foam liner inserted in prosthetic leg (we had to remove it to get leg secured. Pt needs more ply socks from [facility] and needs to make an appt sooner. Continue with prosthetic education, ply sock adjustment, gradual wear time discussion, start sit to stand and ambulation with walker    Consulted and Agree with Plan of Care Patient             Patient will benefit from skilled therapeutic intervention in order to improve  the following deficits and impairments:  Abnormal gait, Decreased activity tolerance, Decreased balance, Decreased mobility, Decreased knowledge of precautions, Decreased endurance, Decreased skin integrity, Decreased scar mobility, Decreased strength, Difficulty walking, Impaired flexibility, Increased fascial restricitons, Increased edema, Improper body mechanics, Postural dysfunction, Pain, Prosthetic Dependency  Visit Diagnosis: Other abnormalities of gait and mobility  Unsteadiness on feet  S/P bilateral BKA (below knee amputation) [facility])     Problem List Patient Active Problem List   Diagnosis Date Noted   Chronic combined systolic and diastolic CHF (congestive heart failure) ([place]) 12/01/2021   S/P BKA (below knee amputation) bilateral ([place]) 07/18/2021   Acute hematogenous osteomyelitis of right foot ([place]) 04/26/2021   Gangrene of right foot ([place])    Foot pain, right 04/14/2021   Subacute osteomyelitis, right ankle and foot ([place])    Status post below-knee amputation ([place]) 02/17/2021  Wound dehiscence    Gangrene of toe of left foot (HCC)    Cutaneous abscess of left foot    Left foot infection 02/07/2021   Leukocytosis 02/07/2021   Thrombocytosis 02/07/2021   Hyponatremia 02/07/2021   AKI (acute kidney injury) ([place]) 02/07/2021   Hyperglycemia due to diabetes mellitus ([place]) 02/07/2021   Hypoalbuminemia due to protein-calorie malnutrition ([place]) 02/07/2021   Osteomyelitis of second toe of left foot ([place])    New onset of congestive heart failure ([place]) 12/26/2020   CKD (chronic kidney disease) stage 3, GFR 30-59 ml/min (HCC) [id number]   Acute systolic CHF (congestive heart failure) ([place]) 12/26/2020   Anemia, chronic disease 06/22/2020   Amputee, great toe, right ([place]) 06/20/2020   Normocytic anemia 06/20/2020   Erectile dysfunction associated with type 2 diabetes mellitus ([place]) 06/20/2020   Positive for macroalbuminuria 10/24/2019   Amputation of left great toe ([place])  10/23/2019   Tobacco dependence 10/23/2019   Osteomyelitis of great toe of right foot ([place]) 06/04/2019   Diabetic foot infection ([place])    Noncompliance    Glaucoma    Hyperlipidemia    Essential hypertension 11/06/2003   Diabetes mellitus ([place]) 11/05/1997    Kerrie Pleasure, PT 12/06/2021, 8:08 PM  [facility] [address] [place], [place], [address] Phone: [phone]   Fax:  [phone]  Name: NIX ELZEY MRN: [id number] Date of Birth: 1956-03-26

## 2021-12-07 ENCOUNTER — Telehealth (HOSPITAL_COMMUNITY): Payer: Self-pay | Admitting: Licensed Clinical Social Worker

## 2021-12-07 DIAGNOSIS — E1152 Type 2 diabetes mellitus with diabetic peripheral angiopathy with gangrene: Secondary | ICD-10-CM | POA: Diagnosis not present

## 2021-12-07 NOTE — Progress Notes (Signed)
Heart and Vascular Care Navigation  12/07/2021  Scott Greer 12-18-1955 [id number]  Reason for Referral: CSW reached out after receiving referral from MD to help get pt reconnected with clinic.   Engaged with patient by telephone for follow up visit for Heart and Vascular Care Coordination.                                                                                                   Assessment: CSW touched base with pt by phone to assess for willingness to be reconnected in the Heart Failure Clinic- has not been seen since March 2022.  Pt is agreeable to coming back to clinic so CSW had scheduler assist with appt on 2/14.  CSW intern set up transportation through [facility] transport for two therapy appts next week as well as Heart Failure appt.  Pt usually gets help setting up transportation through [facility] worker so should have assistance with future appts.  Pt is currently staying at the [facility] under the white flag hotel program run through the [facility].  Should be able to stay in this program through March but unsure what he will do after that.  Pt very unclear if he is receiving help looking in to other options- mentions he has spoken to someone named Scott Greer over at the hotel but can't verbalize what she is assisting him with.  CSW emailed [place] with [facility] as well as Merchant navy officer at [employer] to inquire about current level of involvement with pt and what other options they can assist in pursuing for pt to have more stable housing option once he has to leave the hotel.  CSW confirmed he is still on waitlist for project based voucher program that I assisted him in applying for- unable to see where he is at on that waitlist.                                       HRT/VAS Care Coordination     Patients Home Cardiology Office Heart Failure Clinic   Outpatient Care Team [facility] Paramedic Name: Scott Greer- [phone]   Social  Worker Name: Scott Greer- Advanced Heart Failure- [phone]   Living arrangements for the past 2 months Apartment   Lives with: Self   Home Assistive Devices/Equipment Wheelchair; Environmental consultant (specify type); Shower chair with back; Bedside commode/3-in-1   DME Agency [employer]   [facility] Agency [facility] Care       Social History:                                                                             SDOH Screenings   Alcohol Screen: Low Risk  Last Alcohol Screening Score (AUDIT): 0  Depression (PHQ2-9): Medium Risk   PHQ-2 Score: 5  Financial Resource Strain: High Risk   Difficulty of Paying Living Expenses: Very hard  Food Insecurity: Food Insecurity Present   Worried About Programme researcher, broadcasting/film/video in the Last Year: Sometimes true   Ran Out of Food in the Last Year: Sometimes true  Housing: High Risk   Last Housing Risk Score: 2  Physical Activity: Inactive   Days of Exercise per Week: 0 days   Minutes of Exercise per Session: 0 min  Social Connections: Socially Isolated   Frequency of Communication with Friends and Family: Three times a week   Frequency of Social Gatherings with Friends and Family: Twice a week   Attends Religious Services: Never   Database administrator or Organizations: No   Attends Engineer, structural: Never   Marital Status: Divorced  Stress: Not on file  Tobacco Use: High Risk   Smoking Tobacco Use: Every Day   Smokeless Tobacco Use: Former   Passive Exposure: Not on Chartered certified accountant Needs: Personal assistant (Medical): Yes   Lack of Transportation (Non-Medical): Yes    SDOH Interventions: Financial Resources:    No interventions available- receives SSDI but not sufficient to find housing at this time  Food Insecurity:   None reported  Housing Insecurity:   Currently homeless- staying in [place]- sent messages to case workers with Emergency planning/management officer and [facility] where pt is housed to assess for  possible interventions  Transportation:   Transportation Interventions: Other (Comment) (Assisted with Medicaid transport)   Follow-up plan:    CSW awaiting responses from [facility] and [facility] regarding their involvement with pt and potential long term resources for housing.  Will continue to follow and assist as needed  Scott Sis, LCSW Clinical Social Worker Advanced Heart Failure Clinic Desk#: [phone] Cell#: [phone]

## 2021-12-08 DIAGNOSIS — E1152 Type 2 diabetes mellitus with diabetic peripheral angiopathy with gangrene: Secondary | ICD-10-CM | POA: Diagnosis not present

## 2021-12-11 ENCOUNTER — Other Ambulatory Visit: Payer: Self-pay

## 2021-12-11 ENCOUNTER — Other Ambulatory Visit: Payer: Self-pay | Admitting: *Deleted

## 2021-12-11 DIAGNOSIS — E1152 Type 2 diabetes mellitus with diabetic peripheral angiopathy with gangrene: Secondary | ICD-10-CM | POA: Diagnosis not present

## 2021-12-11 NOTE — Patient Instructions (Signed)
Visit Information  [name] was given information about Medicaid Managed Care team care coordination services as a part of their [facility] Medicaid benefit. Scott Greer verbally consented to engagement with the [facility] Managed Care team.   If you are experiencing a medical emergency, please call 911 or report to your local emergency department or urgent care.   If you have a non-emergency medical problem during routine business hours, please contact your provider's office and ask to speak with a nurse.   For questions related to your [facility] health plan, please call: [phone] or go here:[email or web address]  If you would like to schedule transportation through your [facility] plan, please call the following number at least 2 days in advance of your appointment: [phone].  Call the [place] at [phone], at any time, 24 hours a day, 7 days a week. If you are in danger or need immediate medical attention call 911.  If you would like help to quit smoking, call 1-800-QUIT-NOW 6318072309) OR Espaol: 1-855-Djelo-Ya (8-182-993-7169) o para ms informacin haga clic aqu or Text READY to 200-400 to register via text  [name],   Please see education materials related to eating plan provided as print materials.   The patient verbalized understanding of instructions provided today and agreed to receive a mailed copy of patient instruction and/or educational materials.  Telephone follow up appointment with Managed Medicaid care management team member scheduled for:12/26/21 @ 10:30am  Scott Joiner RN, BSN [place] RN Care Coordinator   Following is a copy of your plan of care:  Care Plan : RN Care Manager Plan of Care  Updates made by [name], RN since 12/11/2021 12:00 AM     Problem: Knowledge Deficits and Care Coordination needs related to long term management of CHF and HTN    Priority: High     Long-Range Goal: Development of Plan of Care to address Care Coordination needs and knowledge deficits related to CHF, HTN and SDOH   Start Date: 10/04/2021  Expected End Date: 01/02/2022  Priority: High  Note:   Current Barriers:  Knowledge Deficits related to plan of care for management of CHF and HTN  Care Coordination needs related to Housing barriers  Scott Greer attended Cardiology appointment with [name]. Patient was referred to [facility] and scheduled on 12/19/21. He attended PT evaluation and is having pain with using prosthetics. He needs to schedule an appointment at [facility] for possible prosthetic modification.   RNCM Clinical Goal(s):  Patient will verbalize understanding of plan for management of CHF and HTN as evidenced by patient verbalization and self care activities take all medications exactly as prescribed and will call provider for medication related questions as evidenced by documentation in EMR    attend all scheduled medical appointments: 11/28/21 with [name] as evidenced by provider documentation in EMR        demonstrate improved adherence to prescribed treatment plan for CHF and HTN as evidenced by documentation in EMR work with social worker to address Housing barriers related to the management of CHF and HTN as evidenced by review of EMR and patient or Education officer, museum report     through collaboration with Consulting civil engineer, provider, and care team.   Interventions: Inter-disciplinary care team collaboration (see longitudinal plan of care) Evaluation of current treatment plan related to  self management and patient's adherence to plan as established by provider   Encouraged patient to reschedule  missed eye appointment,  [place] ph # [phone] address [address] Reviewed medications and advised patient to take all medications with him to [facility] appointment on 12/19/21 Advised patient to have ordered labs drawn  at PCP office   Heart Failure Interventions:  (Status: Goal on Track (progressing): YES.)  Long Term Goal  Provided education on low sodium diet Assessed social determinant of health barriers Reviewed upcoming appointments and discussed the importance of attending appointment at [facility] on 12/19/21 @ 12pm    Hypertension: (Status: Condition stable. Not addressed this visit.) Last practice recorded BP readings:  BP Readings from Last 3 Encounters:  12/01/21 132/78  07/18/21 120/79  05/06/21 119/88  Most recent eGFR/CrCl:  Lab Results  Component Value Date   EGFR 70 07/18/2021    No components found for: CRCL  Discussed plans with patient for ongoing care management follow up and provided patient with direct contact information for care management team; Assessed social determinant of health barriers;   Patient Goals/Self-Care Activities: Take medications as prescribed   Attend all scheduled provider appointments Call pharmacy for medication refills 3-7 days in advance of running out of medications Call provider office for new concerns or questions  Take all medications to doctor's appointments

## 2021-12-11 NOTE — Patient Outreach (Signed)
Medicaid Managed Care   Nurse Care Manager Note  12/11/2021 Name:  Scott Greer MRN:  [id number] DOB:  [birth year]  Scott Greer is an 66 y.o. year old male who is a primary patient of [name], MD.  The [facility] Managed Care Coordination team was consulted for assistance with:    CHF HTN  Scott Greer was given information about Medicaid Managed Care Coordination team services today. Scott Greer Patient agreed to services and verbal consent obtained.  Engaged with patient by telephone for follow up visit in response to provider referral for case management and/or care coordination services.   Assessments/Interventions:  Review of past medical history, allergies, medications, health status, including review of consultants reports, laboratory and other test data, was performed as part of comprehensive evaluation and provision of chronic care management services.  SDOH (Social Determinants of Health) assessments and interventions performed: SDOH Interventions    Flowsheet Row Most Recent Value  SDOH Interventions   Food Insecurity Interventions Intervention Not Indicated  [Patient receiving 3 meals a day.]  Physical Activity Interventions Intervention Not Indicated  [Patient scheduled to attend PT to improve mobility.]       Care Plan  Allergies  Allergen Reactions   Bee Venom Hives, Itching and Swelling    Medications Reviewed Today     Reviewed by [name], RN (Registered Nurse) on 12/11/21 at 1045  Med List Status: <None>   Medication Order Taking? Sig Documenting Provider Last Dose Status Informant  amiodarone (PACERONE) 200 MG tablet [id number] No Take 1 tablet (200 mg total) by mouth daily. [name], MD Unknown Active   aspirin (ASPIRIN LOW DOSE) 81 MG chewable tablet [id number] Yes Chew 1 tablet (81 mg total) by mouth daily. [name], MD Taking Active   atorvastatin (LIPITOR) 10 MG tablet [id number] No Take 1 tablet (10 mg total) by mouth  daily with supper.  Patient not taking: Reported on 12/01/2021   [name], MD Unknown Active   BIDIL 20-37.5 MG tablet [id number] No TAKE ONE TABLET BY MOUTH EVERY MORNING and TAKE ONE TABLET BY MOUTH AT NOON and TAKE ONE TABLET BY MOUTH EVERY EVENING [name], MD Unknown Active   Blood Glucose Monitoring Suppl (TRUE METRIX METER) w/Device KIT [id number] No Use as directed  Patient not taking: Reported on 11/20/2021   [name], MD Not Taking Active Other  carvedilol (COREG) 3.125 MG tablet [id number] No TAKE ONE TABLET BY MOUTH EVERY MORNING and TAKE ONE TABLET EVERY EVENING WITH A MEAL [name], MD Unknown Active   digoxin (LANOXIN) 0.125 MG tablet [id number] No Take 1 tablet (0.125 mg total) by mouth daily with supper. [name], MD Unknown Active   empagliflozin (JARDIANCE) 10 MG TABS tablet [id number] No Take 1 tablet (10 mg total) by mouth daily. [name], MD Unknown Active   ENTRESTO 24-26 [place] [id number] No TAKE ONE TABLET BY MOUTH EVERY MORNING and TAKE ONE TABLET BY MOUTH EVERY EVENING [name], MD Unknown Active   FEROSUL 325 (65 Fe) MG tablet [id number] No TAKE ONE TABLET BY MOUTH EVERY MORNING WITH BREAKFAST  Patient not taking: Reported on 12/01/2021   [name], MD Unknown Active   gabapentin (NEURONTIN) 300 MG capsule [id number] Yes TAKE ONE CAPSULE BY MOUTH THREE TIMES DAILY [name], MD Taking Active   glucose blood (TRUE METRIX BLOOD GLUCOSE TEST) test strip [id number] No Use as instructed  Patient not taking:  Reported on 11/20/2021   [name], MD Not Taking Active Other  metFORMIN (GLUCOPHAGE) 500 MG tablet [id number] Yes TAKE ONE TABLET BY MOUTH TWICE DAILY WITH A MEAL [name], MD Taking Active   nutrition supplement, [name]) PACK [id number] No Take 1 packet by mouth 2 (two) times daily between meals.  Patient not taking: Reported on 11/20/2021   Persons, [name], [name] Not Taking Active    oxyCODONE (ROXICODONE) 5 MG immediate release tablet [id number] No Take 1 tablet (5 mg total) by mouth every 8 (eight) hours as needed for severe pain.  Patient not taking: Reported on 12/01/2021   [name], NP Not Taking Active   potassium chloride SA (KLOR-CON) 20 MEQ tablet [id number] Yes TAKE ONE TABLET BY MOUTH EVERY MORNING [name], MD Taking Active   spironolactone (ALDACTONE) 25 MG tablet [id number] No TAKE ONE TABLET BY MOUTH EVERY MORNING [name], MD Unknown Active   torsemide (DEMADEX) 20 MG tablet [id number] No TAKE ONE TABLET BY MOUTH every morning. needs appt FOR further refills [name], MD Unknown Active   TRUEplus Lancets 28G [place] [id number] No Use as directed  Patient not taking: Reported on 11/20/2021   [name], MD Not Taking Active Other  Vitamin D, Ergocalciferol, (DRISDOL) 1.25 MG (50000 UNIT) CAPS capsule [id number] No TAKE ONE CAPSULE BY MOUTH ONCE WEEKLY ON SUNDAY  Patient not taking: Reported on 12/01/2021   [name], MD Unknown Active   zinc sulfate 220 (50 Zn) MG capsule [id number] No Take 1 capsule (220 mg total) by mouth daily.  Patient not taking: Reported on 08/01/2021   Persons, [name], [name] Unknown Active             Patient Active Problem List   Diagnosis Date Noted   Chronic combined systolic and diastolic CHF (congestive heart failure) ([place]) 12/01/2021   S/P BKA (below knee amputation) bilateral ([place]) 07/18/2021   Acute hematogenous osteomyelitis of right foot ([place]) 04/26/2021   Gangrene of right foot ([place])    Foot pain, right 04/14/2021   Subacute osteomyelitis, right ankle and foot ([place])    Status post below-knee amputation ([place]) 02/17/2021   Wound dehiscence    Gangrene of toe of left foot ([place])    Cutaneous abscess of left foot    Left foot infection 02/07/2021   Leukocytosis 02/07/2021   Thrombocytosis 02/07/2021   Hyponatremia 02/07/2021   AKI (acute kidney injury) ([place]) 02/07/2021    Hyperglycemia due to diabetes mellitus ([place]) 02/07/2021   Hypoalbuminemia due to protein-calorie malnutrition ([place]) 02/07/2021   Osteomyelitis of second toe of left foot ([place])    New onset of congestive heart failure ([place]) 12/26/2020   CKD (chronic kidney disease) stage 3, GFR 30-59 ml/min (HCC) 95/07/3266   Acute systolic CHF (congestive heart failure) ([place]) 12/26/2020   Anemia, chronic disease 06/22/2020   Amputee, great toe, right ([place]) 06/20/2020   Normocytic anemia 06/20/2020   Erectile dysfunction associated with type 2 diabetes mellitus ([place]) 06/20/2020   Positive for macroalbuminuria 10/24/2019   Amputation of left great toe ([place]) 10/23/2019   Tobacco dependence 10/23/2019   Osteomyelitis of great toe of right foot ([place]) 06/04/2019   Diabetic foot infection ([place])    Noncompliance    Glaucoma    Hyperlipidemia    Essential hypertension 11/06/2003   Diabetes mellitus ([employer]) 11/05/1997    Conditions to be addressed/monitored per PCP order:  CHF and HTN  Care Plan : RN  Care Manager Plan of Care  Updates made by [name], RN since 12/11/2021 12:00 AM     Problem: Knowledge Deficits and Care Coordination needs related to long term management of CHF and HTN   Priority: High     Long-Range Goal: Development of Plan of Care to address Care Coordination needs and knowledge deficits related to CHF, HTN and SDOH   Start Date: 10/04/2021  Expected End Date: 01/02/2022  Priority: High  Note:   Current Barriers:  Knowledge Deficits related to plan of care for management of CHF and HTN  Care Coordination needs related to Housing barriers  Mr. Lagos attended Cardiology appointment with [name]. Patient was referred to [facility] and scheduled on 12/19/21. He attended PT evaluation and is having pain with using prosthetics. He needs to schedule an appointment at [facility] for possible prosthetic modification.   RNCM Clinical Goal(s):  Patient will verbalize understanding of  plan for management of CHF and HTN as evidenced by patient verbalization and self care activities take all medications exactly as prescribed and will call provider for medication related questions as evidenced by documentation in EMR    attend all scheduled medical appointments: 11/28/21 with [name] as evidenced by provider documentation in EMR        demonstrate improved adherence to prescribed treatment plan for CHF and HTN as evidenced by documentation in EMR work with Education officer, museum to address Housing barriers related to the management of CHF and HTN as evidenced by review of EMR and patient or Education officer, museum report     through collaboration with Consulting civil engineer, provider, and care team.   Interventions: Inter-disciplinary care team collaboration (see longitudinal plan of care) Evaluation of current treatment plan related to  self management and patient's adherence to plan as established by provider   Encouraged patient to reschedule missed eye appointment,  [place] ph # [phone] address [place] [address] Reviewed medications and advised patient to take all medications with him to [facility] appointment on 12/19/21 Advised patient to have ordered labs drawn at PCP office   Heart Failure Interventions:  (Status: Goal on Track (progressing): YES.)  Long Term Goal  Provided education on low sodium diet Assessed social determinant of health barriers Reviewed upcoming appointments and discussed the importance of attending appointment at [facility] on 12/19/21 @ 12pm    Hypertension: (Status: Condition stable. Not addressed this visit.) Last practice recorded BP readings:  BP Readings from Last 3 Encounters:  12/01/21 132/78  07/18/21 120/79  05/06/21 119/88  Most recent eGFR/CrCl:  Lab Results  Component Value Date   EGFR 70 07/18/2021    No components found for: CRCL  Discussed plans with patient for ongoing care management follow up and provided patient with direct  contact information for care management team; Assessed social determinant of health barriers;   Patient Goals/Self-Care Activities: Take medications as prescribed   Attend all scheduled provider appointments Call pharmacy for medication refills 3-7 days in advance of running out of medications Call provider office for new concerns or questions  Take all medications to doctor's appointments      Follow Up:  Patient agrees to Care Plan and Follow-up.  Plan: The Managed Medicaid care management team will reach out to the patient again over the next 14 days.  Date/time of next scheduled RN care management/care coordination outreach:  12/26/21 @ 10:30am  [name] RN, [place]  Energy manager

## 2021-12-12 ENCOUNTER — Telehealth: Payer: Self-pay | Admitting: Pharmacist

## 2021-12-12 ENCOUNTER — Ambulatory Visit: Payer: Self-pay

## 2021-12-12 DIAGNOSIS — E1152 Type 2 diabetes mellitus with diabetic peripheral angiopathy with gangrene: Secondary | ICD-10-CM | POA: Diagnosis not present

## 2021-12-12 DIAGNOSIS — F320 Major depressive disorder, single episode, mild: Secondary | ICD-10-CM | POA: Diagnosis not present

## 2021-12-12 NOTE — Patient Outreach (Signed)
12/12/2021 Name: Scott Greer MRN: [id number] DOB: 12-07-1955  Referred by: [name], MD Reason for referral : No chief complaint on file.   An unsuccessful telephone outreach was attempted today. The patient was referred to the case management team for assistance with care management and care coordination.    Follow Up Plan: The Managed Medicaid care management team will reach out to the patient again over the next 14 days. Voicemail box was full.  Cheral Almas PharmD, CPP High Risk Managed Medicaid [place] [phone]

## 2021-12-13 ENCOUNTER — Ambulatory Visit: Payer: Medicaid Other

## 2021-12-13 DIAGNOSIS — E1152 Type 2 diabetes mellitus with diabetic peripheral angiopathy with gangrene: Secondary | ICD-10-CM | POA: Diagnosis not present

## 2021-12-13 NOTE — Telephone Encounter (Signed)
Patient Name: Scott Greer MRN: [id number] DOB:04-01-1956, 66 y.o., male Today's Date: 12/13/2021  Called patient as he missed his 12/13/20 appt at 10:15am. Pt reports that he hasnot been able to get his prosthetic leg on due to swelling. His appointment with [facility] is not until 12/16/21. Discussed that it would be best to cancel his next appt on 12/15/21 and then reschedule these 2 appts starting week of 12/18/21. Pt agreed.   Ileana Ladd, PT 12/13/2021, 11:04 AM

## 2021-12-14 DIAGNOSIS — E1152 Type 2 diabetes mellitus with diabetic peripheral angiopathy with gangrene: Secondary | ICD-10-CM | POA: Diagnosis not present

## 2021-12-15 ENCOUNTER — Ambulatory Visit: Payer: Medicaid Other

## 2021-12-15 DIAGNOSIS — E1152 Type 2 diabetes mellitus with diabetic peripheral angiopathy with gangrene: Secondary | ICD-10-CM | POA: Diagnosis not present

## 2021-12-18 ENCOUNTER — Telehealth (HOSPITAL_COMMUNITY): Payer: Self-pay

## 2021-12-18 DIAGNOSIS — E1152 Type 2 diabetes mellitus with diabetic peripheral angiopathy with gangrene: Secondary | ICD-10-CM | POA: Diagnosis not present

## 2021-12-18 NOTE — Telephone Encounter (Signed)
Called and left patient a voice message to confirm/remind patient of their appointment at [facility] on 12/19/21.  ° ° °

## 2021-12-18 NOTE — Progress Notes (Incomplete)
[facility] Note  PCP: Dr. Karle Plumber HF Cardiologist: Dr. Aundra Dubin  HPI: Mr Portela is a 66 year old with a history of HTN, hyperlipidemia, diabetes, Bilateral great toe amputation due to osteo, ETOH 1 pint a week, and smoker    He has been disabled for the last couple of years. He has difficulty paying for medications. He stopped getting meds about 9 months ago. He has difficulty paying for housing.    Presented to [facility] ED on 2/21 with lower extremity edema and weakness. Echo showed biventricular HF (LVEF 25-30% + severely reduced RV). Diuresed with IV lasix, and started on Entresto, Jardiance, and spiro. Had high PVC burden so Toprol XL increased to 100 mg daily. Had cath (2/22): with elevated filling pressures, low output HF, and min CAD. Diuresed 20 lbs, started on GDMT, discharge weight 199 lbs.  Today he returns for HF post-hospital follow up. Overall feeling fine, SOB if walking fast. Denies increasing SOB, CP, dizziness, edema, or PND/Orthopnea. Appetite ok. No fever or chills. Does not weigh at home. Taking all medications. Smokes 6 cigars/day, 1 pint of liquor/week, sometimes more.   Cardiac Studies: RHC/LHC (2/22): Ost LAD -Prox LAD 30% stenosed RA 17 PA 47/27 (36)  PCWP 22 CO 3 CI 1.3  cMRI (3/22):  -Technically difficult study due to poor breath-holding. - LV EF 23%, RVEF 22%, nonspecific RV insertion LGE.  ROS: All systems negative except as listed in HPI, PMH and Problem List.  SH:  Social History   Socioeconomic History   Marital status: Legally Separated    Spouse name: Tawana   Number of children: 2   Years of education: Not on file   Highest education level: Associate degree: occupational, Hotel manager, or vocational program  Occupational History   Not on file  Tobacco Use   Smoking status: Every Day    Types: Cigars   Smokeless tobacco: Former    Types: Chew   Tobacco comments:    8 daily  Vaping Use   Vaping Use: Never used   Substance and Sexual Activity   Alcohol use: Yes    Alcohol/week: 11.0 standard drinks    Types: 3 Cans of beer, 8 Shots of liquor per week    Comment: occasional   Drug use: Yes    Types: Marijuana    Comment: ocassional - last time 05/08/2020   Sexual activity: Not Currently  Other Topics Concern   Not on file  Social History Narrative   Out of prison for 2 months.   Lives at home with his wife.   Social Determinants of Health   Financial Resource Strain: High Risk   Difficulty of Paying Living Expenses: Very hard  Food Insecurity: Landscape architect Present   Worried About Charity fundraiser in the Last Year: Sometimes true   Arboriculturist in the Last Year: Sometimes true  Transportation Needs: Public librarian (Medical): Yes   Lack of Transportation (Non-Medical): Yes  Physical Activity: Inactive   Days of Exercise per Week: 0 days   Minutes of Exercise per Session: 0 min  Stress: Not on file  Social Connections: Socially Isolated   Frequency of Communication with Friends and Family: Three times a week   Frequency of Social Gatherings with Friends and Family: Twice a week   Attends Religious Services: Never   Marine scientist or Organizations: No   Attends Archivist Meetings: Never  Marital Status: Divorced  Human resources officer Violence: Not on file   FH:  Family History  Problem Relation Age of Onset   Stroke Mother    Diabetes Mother        Toward end of life    Past Medical History:  Diagnosis Date   CHF (congestive heart failure) ([place])    Dehiscence of amputation stump ([place])    left great toe   Diabetes mellitus without complication ([place]) 6606   Type II   Glaucoma 2015   Hyperlipidemia    Hypertension 2005   Left foot infection 02/07/2021   Osteomyelitis (HCC)    Wears glasses     Current Outpatient Medications  Medication Sig Dispense Refill   amiodarone (PACERONE) 200 MG tablet Take 1 tablet  (200 mg total) by mouth daily. 30 tablet 6   aspirin (ASPIRIN LOW DOSE) 81 MG chewable tablet Chew 1 tablet (81 mg total) by mouth daily. 30 tablet 6   atorvastatin (LIPITOR) 10 MG tablet Take 1 tablet (10 mg total) by mouth daily with supper. (Patient not taking: Reported on 12/01/2021) 30 tablet 4   BIDIL 20-37.5 MG tablet TAKE ONE TABLET BY MOUTH EVERY MORNING and TAKE ONE TABLET BY MOUTH AT NOON and TAKE ONE TABLET BY MOUTH EVERY EVENING 90 tablet 2   Blood Glucose Monitoring Suppl (TRUE METRIX METER) w/Device KIT Use as directed (Patient not taking: Reported on 11/20/2021) 1 kit 0   carvedilol (COREG) 3.125 MG tablet TAKE ONE TABLET BY MOUTH EVERY MORNING and TAKE ONE TABLET EVERY EVENING WITH A MEAL 60 tablet 2   digoxin (LANOXIN) 0.125 MG tablet Take 1 tablet (0.125 mg total) by mouth daily with supper. 30 tablet 6   empagliflozin (JARDIANCE) 10 MG TABS tablet Take 1 tablet (10 mg total) by mouth daily. 30 tablet 6   ENTRESTO 24-26 MG TAKE ONE TABLET BY MOUTH EVERY MORNING and TAKE ONE TABLET BY MOUTH EVERY EVENING 60 tablet 2   FEROSUL 325 (65 Fe) MG tablet TAKE ONE TABLET BY MOUTH EVERY MORNING WITH BREAKFAST (Patient not taking: Reported on 12/01/2021) 100 tablet 1   gabapentin (NEURONTIN) 300 MG capsule TAKE ONE CAPSULE BY MOUTH THREE TIMES DAILY 90 capsule 5   glucose blood (TRUE METRIX BLOOD GLUCOSE TEST) test strip Use as instructed (Patient not taking: Reported on 11/20/2021) 100 each 12   metFORMIN (GLUCOPHAGE) 500 MG tablet TAKE ONE TABLET BY MOUTH TWICE DAILY WITH A MEAL 60 tablet 1   nutrition supplement, JUVEN, (JUVEN) PACK Take 1 packet by mouth 2 (two) times daily between meals. (Patient not taking: Reported on 11/20/2021)  0   oxyCODONE (ROXICODONE) 5 MG immediate release tablet Take 1 tablet (5 mg total) by mouth every 8 (eight) hours as needed for severe pain. (Patient not taking: Reported on 12/01/2021) 30 tablet 0   potassium chloride SA (KLOR-CON) 20 MEQ tablet TAKE ONE TABLET BY  MOUTH EVERY MORNING 30 tablet 2   spironolactone (ALDACTONE) 25 MG tablet TAKE ONE TABLET BY MOUTH EVERY MORNING 30 tablet 2   torsemide (DEMADEX) 20 MG tablet TAKE ONE TABLET BY MOUTH every morning. needs appt FOR further refills 15 tablet 0   TRUEplus Lancets 28G MISC Use as directed (Patient not taking: Reported on 11/20/2021) 100 each 4   Vitamin D, Ergocalciferol, (DRISDOL) 1.25 MG (50000 UNIT) CAPS capsule TAKE ONE CAPSULE BY MOUTH ONCE WEEKLY ON SUNDAY (Patient not taking: Reported on 12/01/2021) 4 capsule 0   zinc sulfate 220 (50 Zn) MG  capsule Take 1 capsule (220 mg total) by mouth daily. (Patient not taking: Reported on 08/01/2021)     No current facility-administered medications for this visit.    There were no vitals filed for this visit.  Wt Readings from Last 3 Encounters:  05/05/21 97.9 kg (215 lb 13.3 oz)  04/26/21 90.7 kg (199 lb 15.3 oz)  04/14/21 90.7 kg (200 lb)   PHYSICAL EXAM:  General:  NAD. No resp difficulty. HEENT: Normal. Neck: Supple. JVP 7-8. Carotids 2+ bilat; no bruits. No lymphadenopathy or thryomegaly appreciated. Cor: PMI nondisplaced. Regular rate & rhythm. No rubs, gallops or murmurs. Lungs: Clear, diminished in bases Abdomen: Soft, nontender, nondistended. No hepatosplenomegaly. No bruits or masses. Good bowel sounds. Extremities: No cyanosis, clubbing, rash, 1+ LE edema, L>R Neuro: Alert & oriented x 3, cranial nerves grossly intact. Moves all 4 extremities w/o difficulty. Affect pleasant.  ECG: SR 95 bpm, no ectopy (personally reviewed).  ASSESSMENT & PLAN: 1. Chronic systolic CHF: Echo this admission reviewed, EF 20-25% range with severe RV dysfunction.  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  HIV negative.  He drinks, but not markedly heavily (pint/week liquor). He apparently had poorly controlled HTN prior to admission (not taking meds for about 9 months). 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  [facility] on 01/02/21 showed elevated filling pressures especially on the right side, also low CI at 1.3. cMRI with LV EF 23%, RVEF 22%, nonspecific RV insertion LGE.  Excellent diuresis, weight down 20 lbs (50 lbs total).   - NYHA II, physically limited by amputated toes and foot pain. Volume up slightly on exam, although weight stable. - Increase torsemide 60 mg daily. May be able to back down at next visit w/ increase in [place]. - Increase Entresto to 97/103 mg bid.  BMET today, repeat 7-10 days. - Continue Jardiance 10 mg daily.  - Continue spironolactone 25 mg daily.  - Continue digoxin 0.125 daily. Dig level today. - Consider adding Toprol at next visit. - Suspect he would not be compliant with Bidil at home.  - Given social situation and ETOH abuse, he would not be a good candidate for advanced therapies at this time.  - I suspect ETOH played a major role in CMP.  We discussed cessation of ETOH today.  2. CKD: Stage 3a.  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  BMET today. 3. HTN: BP stable today, increase Entresto as above. 4. PVCs: Would avoid high dose Toprol XL for now with recent low output/volume overload.  - Continue amiodarone 200 mg bid to try to control PVCs.  5. ETOH abuse: needs complete cessation.  - Would benefit from Paramedicine assistance as he has trouble with his medications and understanding which medications he needs to take and when; also appears to have poor insight to his disease process and importance of medication compliance.  - Follow back in 3 weeks with PharmD (consider adding beta blocker) for further medication titration and in 6 weeks with APP.  Allena Katz, FNP-BC 12/18/21

## 2021-12-19 ENCOUNTER — Encounter (HOSPITAL_COMMUNITY): Payer: Medicaid Other

## 2021-12-19 DIAGNOSIS — Z7689 Persons encountering health services in other specified circumstances: Secondary | ICD-10-CM | POA: Diagnosis not present

## 2021-12-19 DIAGNOSIS — E1152 Type 2 diabetes mellitus with diabetic peripheral angiopathy with gangrene: Secondary | ICD-10-CM | POA: Diagnosis not present

## 2021-12-19 IMAGING — CR DG FOOT COMPLETE 3+V*L*
3 series · 3 of 3 positions shown · non-contrast
Comparison: None.

CLINICAL DATA: Wound dehiscence

EXAM:
LEFT FOOT - COMPLETE 3+ VIEW

[foot ap]
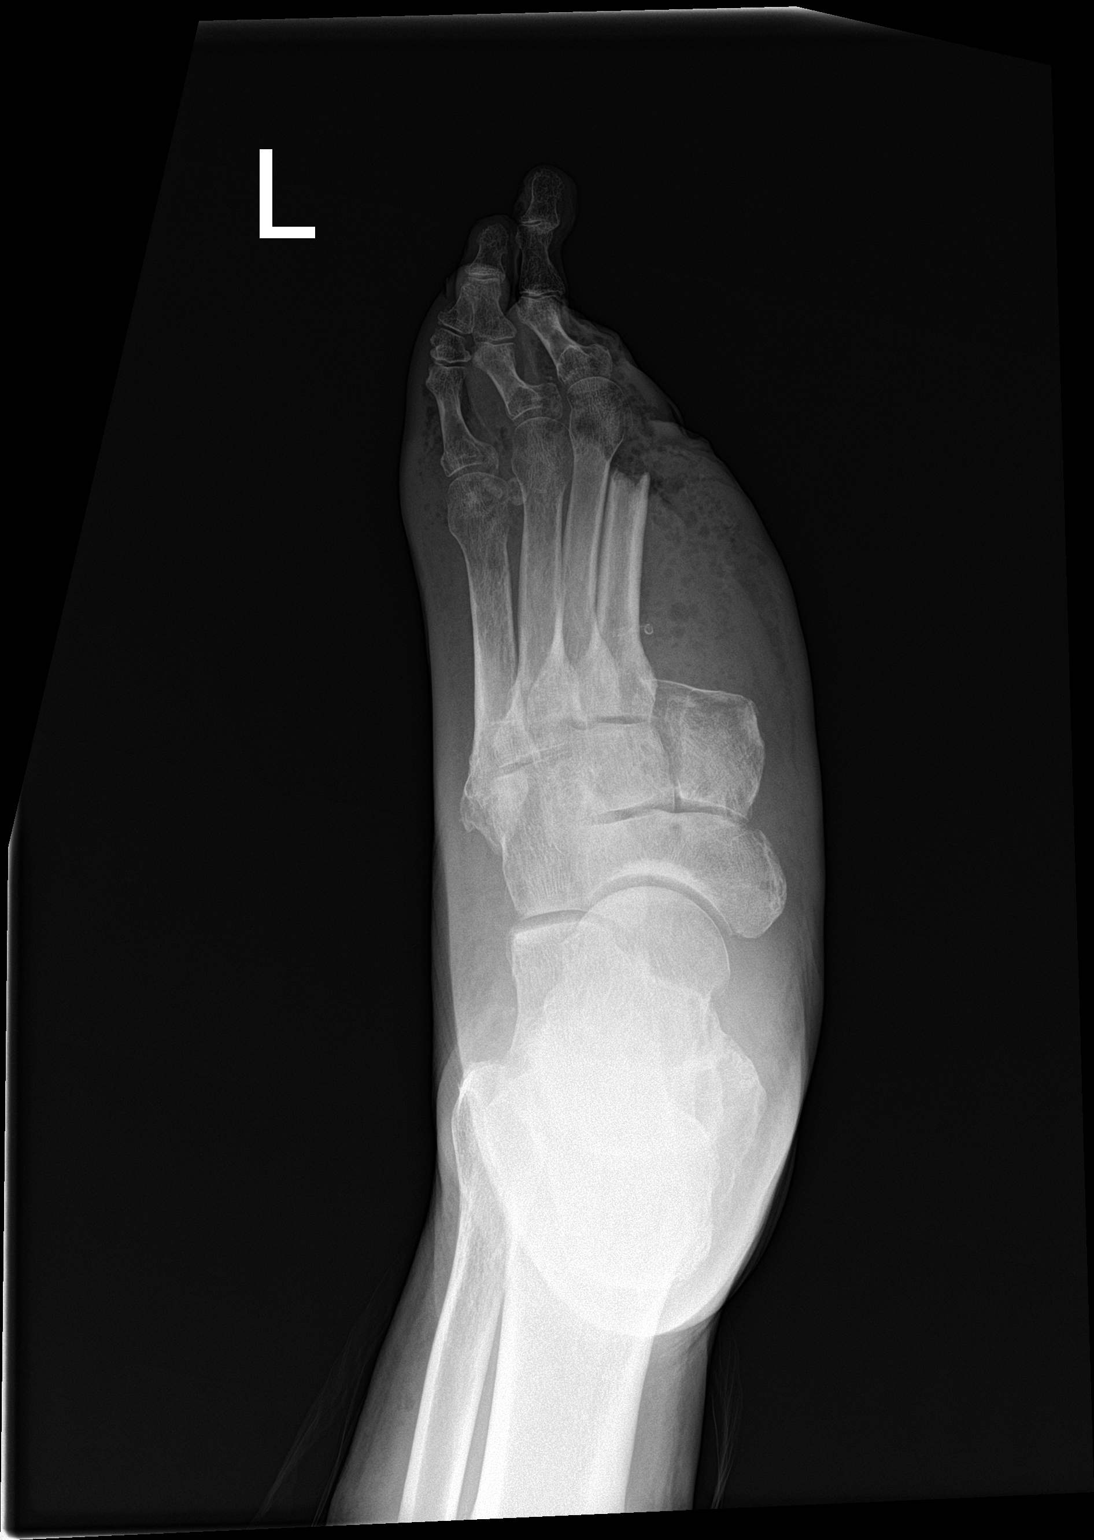

[foot obl]
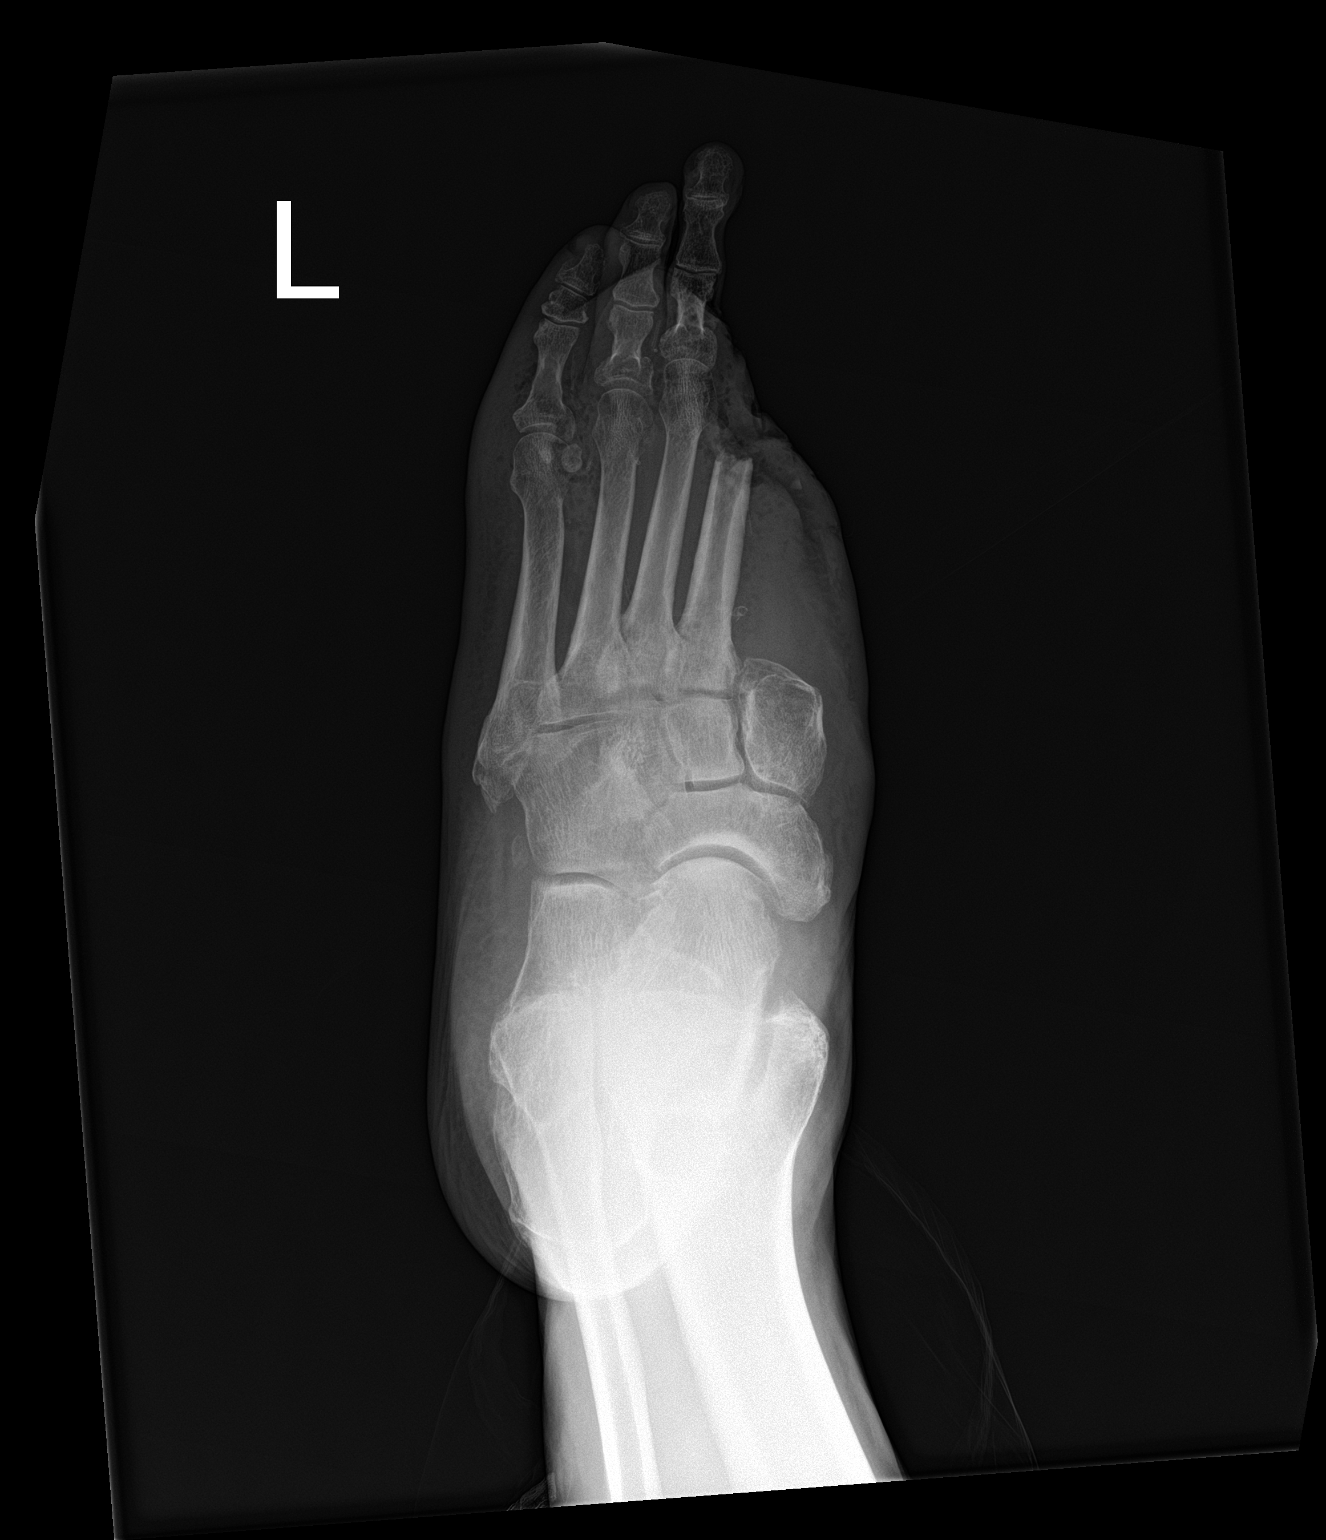

[foot lat]
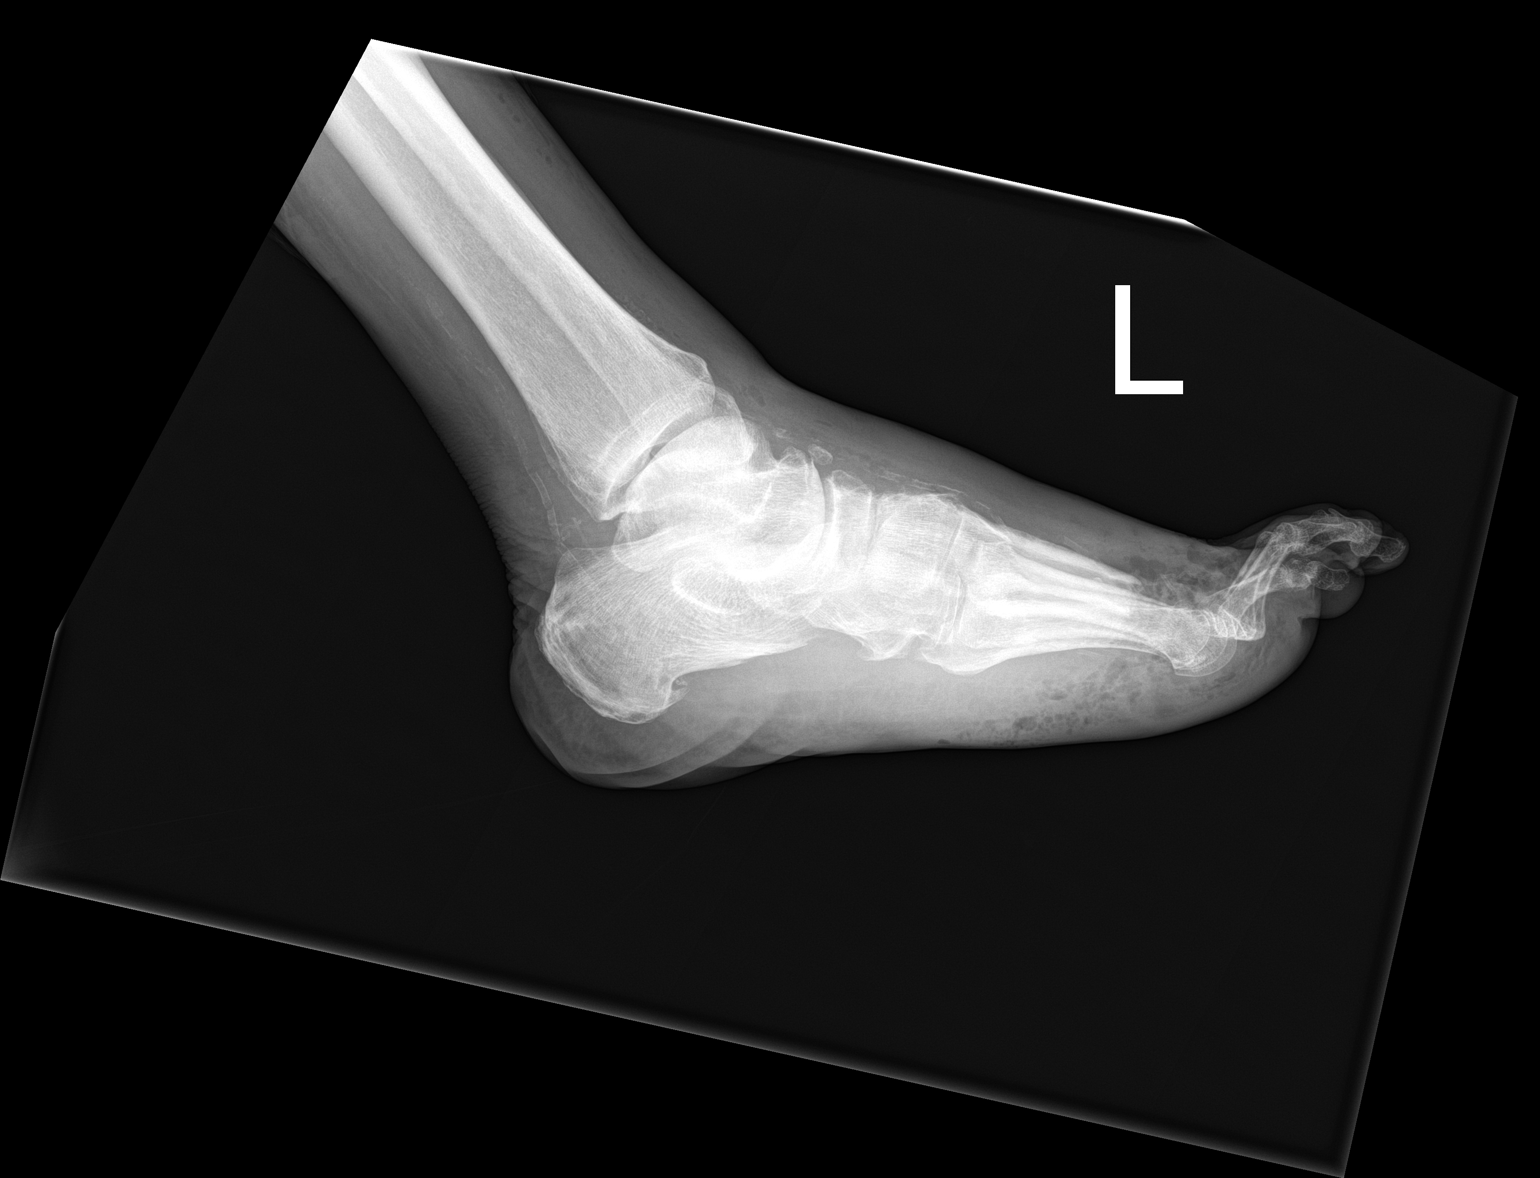

[3 of 3 positions shown; findings below may reference images not displayed]

FINDINGS: Prior 1st and 2nd transmetatarsal amputation. Gas noted throughout
the soft tissues of the forefoot. No bone destruction to suggest
osteomyelitis. No acute fracture, subluxation or dislocation.
IMPRESSION: Gas throughout the soft tissues of the foot. No changes of acute
osteomyelitis by plain film.

## 2021-12-20 ENCOUNTER — Ambulatory Visit: Payer: Medicaid Other

## 2021-12-20 DIAGNOSIS — F320 Major depressive disorder, single episode, mild: Secondary | ICD-10-CM | POA: Diagnosis not present

## 2021-12-20 DIAGNOSIS — E1152 Type 2 diabetes mellitus with diabetic peripheral angiopathy with gangrene: Secondary | ICD-10-CM | POA: Diagnosis not present

## 2021-12-21 DIAGNOSIS — E1152 Type 2 diabetes mellitus with diabetic peripheral angiopathy with gangrene: Secondary | ICD-10-CM | POA: Diagnosis not present

## 2021-12-22 DIAGNOSIS — E1152 Type 2 diabetes mellitus with diabetic peripheral angiopathy with gangrene: Secondary | ICD-10-CM | POA: Diagnosis not present

## 2021-12-23 DIAGNOSIS — E1152 Type 2 diabetes mellitus with diabetic peripheral angiopathy with gangrene: Secondary | ICD-10-CM | POA: Diagnosis not present

## 2021-12-25 ENCOUNTER — Other Ambulatory Visit (HOSPITAL_COMMUNITY): Payer: Self-pay | Admitting: Cardiology

## 2021-12-25 ENCOUNTER — Other Ambulatory Visit: Payer: Self-pay | Admitting: Internal Medicine

## 2021-12-26 ENCOUNTER — Other Ambulatory Visit: Payer: Self-pay | Admitting: *Deleted

## 2021-12-26 NOTE — Patient Outreach (Signed)
Care Coordination  12/26/2021  Scott Greer 11-10-1955 [id number]   Medicaid Managed Care   Unsuccessful Outreach Note  12/26/2021 Name: Scott Greer MRN: [id number] DOB: 08-02-1956  Referred by: [name], MD Reason for referral : High Risk Managed Medicaid (Unsuccessful RNCM follow up telephone outreach)   An unsuccessful telephone outreach was attempted today. The patient was referred to the case management team for assistance with care management and care coordination.   Follow Up Plan: The care management team will reach out to the patient again over the next 14 days.   Estanislado Emms RN, BSN [place]   [facility]

## 2021-12-26 NOTE — Patient Instructions (Signed)
Visit Information  Mr. Blayn L Agresti  - as a part of your Medicaid benefit, you are eligible for care management and care coordination services at no cost or copay. I was unable to reach you by phone today but would be happy to help you with your health related needs. Please feel free to call me @ [phone].   A member of the Managed Medicaid care management team will reach out to you again over the next 14 days.   Oisin Yoakum RN, BSN [place]  [facility] RN Care Coordinator   

## 2021-12-26 NOTE — Telephone Encounter (Signed)
Requested medication (s) are due for refill today: yes  Requested medication (s) are on the active medication list: yes  Last refill:  06/03/21 #90/5  Future visit scheduled: yes  Notes to clinic:  PHQ not completed, please advise     Requested Prescriptions  Pending Prescriptions Disp Refills   gabapentin (NEURONTIN) 300 MG capsule [Pharmacy Med Name: gabapentin 300 mg capsule] 90 capsule 5    Sig: TAKE ONE CAPSULE BY MOUTH THREE TIMES DAILY     Neurology: Anticonvulsants - gabapentin Failed - 12/25/2021  8:02 AM      Failed - Completed PHQ-2 or PHQ-9 in the last 360 days      Passed - Cr in normal range and within 360 days    Creatinine, Ser  Date Value Ref Range Status  07/18/2021 1.18 0.76 - 1.27 mg/dL Final          Passed - Valid encounter within last 12 months    Recent Outpatient Visits           1 month ago Type 2 diabetes mellitus with other circulatory complications [facility])   [place] [facility] [place], Gavin Pound B, MD   5 months ago Type 2 diabetes mellitus with other circulatory complications [facility])   [place] [facility] Jonah Blue B, MD   8 months ago Type 2 diabetes mellitus with other circulatory complications [facility])   [place] [facility] Jonah Blue B, MD   9 months ago Type 2 diabetes mellitus with diabetic nephropathy, without long-term current use of insulin (HCC)   [place] [facility] Jonah Blue B, MD   1 year ago Type 2 diabetes mellitus with diabetic nephropathy, without long-term current use of insulin (HCC)    [facility] [name], MD       Future Appointments             In 4 weeks [name], MD [facility]

## 2021-12-27 DIAGNOSIS — F320 Major depressive disorder, single episode, mild: Secondary | ICD-10-CM | POA: Diagnosis not present

## 2021-12-29 ENCOUNTER — Ambulatory Visit: Payer: Medicaid Other

## 2021-12-29 DIAGNOSIS — E1152 Type 2 diabetes mellitus with diabetic peripheral angiopathy with gangrene: Secondary | ICD-10-CM | POA: Diagnosis not present

## 2021-12-30 DIAGNOSIS — E1152 Type 2 diabetes mellitus with diabetic peripheral angiopathy with gangrene: Secondary | ICD-10-CM | POA: Diagnosis not present

## 2021-12-31 DIAGNOSIS — E1152 Type 2 diabetes mellitus with diabetic peripheral angiopathy with gangrene: Secondary | ICD-10-CM | POA: Diagnosis not present

## 2022-01-03 ENCOUNTER — Other Ambulatory Visit (HOSPITAL_COMMUNITY): Payer: Self-pay | Admitting: Cardiology

## 2022-01-03 DIAGNOSIS — Z419 Encounter for procedure for purposes other than remedying health state, unspecified: Secondary | ICD-10-CM | POA: Diagnosis not present

## 2022-01-03 DIAGNOSIS — F320 Major depressive disorder, single episode, mild: Secondary | ICD-10-CM | POA: Diagnosis not present

## 2022-01-03 DIAGNOSIS — E1152 Type 2 diabetes mellitus with diabetic peripheral angiopathy with gangrene: Secondary | ICD-10-CM | POA: Diagnosis not present

## 2022-01-05 ENCOUNTER — Other Ambulatory Visit: Payer: Self-pay | Admitting: *Deleted

## 2022-01-05 ENCOUNTER — Telehealth: Payer: Self-pay

## 2022-01-05 ENCOUNTER — Ambulatory Visit: Payer: Medicaid Other | Attending: Orthopedic Surgery

## 2022-01-05 NOTE — Patient Outreach (Signed)
Care Coordination ? ?01/05/2022 ? ?[name] ?06/04/1956 ?[id number] ? ? ?Medicaid Managed Care  ? ?Unsuccessful Outreach Note ? ?01/05/2022 ?Name: Scott Greer MRN: [id number] DOB: Jan 17, 1956 ? ?Referred by: [name], MD ?Reason for referral : High Risk Managed Medicaid (Unsuccessful RNCM follow up telephone outreach) ? ? ?A second unsuccessful telephone outreach was attempted today. The patient was referred to the case management team for assistance with care management and care coordination.  ? ?Follow Up Plan: The care management team will reach out to the patient again over the next 14 days.  ? ?[name] RN, BSN ?[place] ?RN Care Coordinator ? ? ?

## 2022-01-05 NOTE — Therapy (Deleted)
Patient Name: ADRIEN SHANKAR ?MRN: [id number] ?DOB:[birth year], 65 y.o., male ?Today's Date: 01/05/2022 ? ?Patient was initially evaluated for outpatient physical therapy on 12/06/21. Since then patient has no showed 5 consecutive times. We tried calling patient but phone number is not valid as there was no ring tone and we weren't able to leave voice mail. Due to poor compliance with physical therapy, patient will be discharged from physical therapy and will need new referral before starting physical therapy again. ? ? ?Ileana Ladd, PT ?01/05/2022, 9:46 AM ? ?

## 2022-01-05 NOTE — Patient Instructions (Signed)
Visit Information  [name]  - as a part of your Medicaid benefit, you are eligible for care management and care coordination services at no cost or copay. I was unable to reach you by phone today but would be happy to help you with your health related needs. Please feel free to call me @ [phone].   A member of the Managed Medicaid care management team will reach out to you again over the next 14 days.   Camari Quintanilla RN, BSN [place]  [facility] RN Care Coordinator   

## 2022-01-05 NOTE — Telephone Encounter (Signed)
Patient Name: Scott Greer ?MRN: [id number] ?DOB:February 21, 1956, 66 y.o., male ?Today's Date: 01/05/2022 ?  ?Patient was initially evaluated for outpatient physical therapy on 12/06/21. Since then patient has no showed 5 consecutive times. We tried calling patient but phone number is not valid as there was no ring tone and we weren't able to leave voice mail. Due to poor compliance with physical therapy, patient will be discharged from physical therapy and will need new referral before starting physical therapy again. ?  ?  ?Kerrie Pleasure, PT ?01/05/2022, 9:46 AM ?   ?  ?  ?  ? ?Note Details ? ?Doyle Askew, Prince Solian, PT File Time 01/05/2022  9:48 AM  ?Author Type Physical Therapist Status Unsigned  ?Last Editor Kerrie Pleasure, PT Specialty Physical Therapy  ? ?

## 2022-01-07 DIAGNOSIS — E1152 Type 2 diabetes mellitus with diabetic peripheral angiopathy with gangrene: Secondary | ICD-10-CM | POA: Diagnosis not present

## 2022-01-09 DIAGNOSIS — E1152 Type 2 diabetes mellitus with diabetic peripheral angiopathy with gangrene: Secondary | ICD-10-CM | POA: Diagnosis not present

## 2022-01-10 DIAGNOSIS — E1152 Type 2 diabetes mellitus with diabetic peripheral angiopathy with gangrene: Secondary | ICD-10-CM | POA: Diagnosis not present

## 2022-01-10 DIAGNOSIS — F320 Major depressive disorder, single episode, mild: Secondary | ICD-10-CM | POA: Diagnosis not present

## 2022-01-11 DIAGNOSIS — E1152 Type 2 diabetes mellitus with diabetic peripheral angiopathy with gangrene: Secondary | ICD-10-CM | POA: Diagnosis not present

## 2022-01-12 DIAGNOSIS — E1152 Type 2 diabetes mellitus with diabetic peripheral angiopathy with gangrene: Secondary | ICD-10-CM | POA: Diagnosis not present

## 2022-01-13 DIAGNOSIS — E1152 Type 2 diabetes mellitus with diabetic peripheral angiopathy with gangrene: Secondary | ICD-10-CM | POA: Diagnosis not present

## 2022-01-14 DIAGNOSIS — E1152 Type 2 diabetes mellitus with diabetic peripheral angiopathy with gangrene: Secondary | ICD-10-CM | POA: Diagnosis not present

## 2022-01-15 DIAGNOSIS — E1152 Type 2 diabetes mellitus with diabetic peripheral angiopathy with gangrene: Secondary | ICD-10-CM | POA: Diagnosis not present

## 2022-01-16 DIAGNOSIS — E1152 Type 2 diabetes mellitus with diabetic peripheral angiopathy with gangrene: Secondary | ICD-10-CM | POA: Diagnosis not present

## 2022-01-17 DIAGNOSIS — E1152 Type 2 diabetes mellitus with diabetic peripheral angiopathy with gangrene: Secondary | ICD-10-CM | POA: Diagnosis not present

## 2022-01-18 DIAGNOSIS — F320 Major depressive disorder, single episode, mild: Secondary | ICD-10-CM | POA: Diagnosis not present

## 2022-01-20 ENCOUNTER — Encounter (HOSPITAL_COMMUNITY): Payer: Self-pay

## 2022-01-20 ENCOUNTER — Emergency Department (HOSPITAL_COMMUNITY)
Admission: EM | Admit: 2022-01-20 | Discharge: 2022-01-21 | Disposition: A | Discharge status: Home / Self Care | Payer: Medicaid Other | Attending: Emergency Medicine | Admitting: Emergency Medicine

## 2022-01-20 ENCOUNTER — Other Ambulatory Visit: Payer: Self-pay

## 2022-01-20 DIAGNOSIS — Y92410 Unspecified street and highway as the place of occurrence of the external cause: Secondary | ICD-10-CM | POA: Diagnosis not present

## 2022-01-20 DIAGNOSIS — Z0000 Encounter for general adult medical examination without abnormal findings: Secondary | ICD-10-CM | POA: Diagnosis not present

## 2022-01-20 DIAGNOSIS — Z5321 Procedure and treatment not carried out due to patient leaving prior to being seen by health care provider: Secondary | ICD-10-CM | POA: Insufficient documentation

## 2022-01-20 DIAGNOSIS — Z743 Need for continuous supervision: Secondary | ICD-10-CM | POA: Diagnosis not present

## 2022-01-20 DIAGNOSIS — T1490XA Injury, unspecified, initial encounter: Secondary | ICD-10-CM | POA: Diagnosis not present

## 2022-01-20 DIAGNOSIS — I10 Essential (primary) hypertension: Secondary | ICD-10-CM | POA: Diagnosis not present

## 2022-01-20 NOTE — ED Notes (Signed)
Called Pt several times to be roomed no answer. ?

## 2022-01-20 NOTE — ED Notes (Signed)
Pt is sitting in the lobby but did answer when he was called. Pt refused the bed saying he will come back tomorrow. ?
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
## 2022-01-23 ENCOUNTER — Other Ambulatory Visit: Payer: Self-pay | Admitting: Internal Medicine

## 2022-01-23 ENCOUNTER — Ambulatory Visit: Payer: Medicaid Other | Admitting: Internal Medicine

## 2022-01-23 DIAGNOSIS — E1121 Type 2 diabetes mellitus with diabetic nephropathy: Secondary | ICD-10-CM

## 2022-01-24 DIAGNOSIS — E1152 Type 2 diabetes mellitus with diabetic peripheral angiopathy with gangrene: Secondary | ICD-10-CM | POA: Diagnosis not present

## 2022-01-24 NOTE — Telephone Encounter (Signed)
Requested Prescriptions  ?Refused Prescriptions Disp Refills  ?? metFORMIN (GLUCOPHAGE) 500 MG tablet [Pharmacy Med Name: metformin 500 mg tablet] 60 tablet 1  ?  Sig: TAKE ONE TABLET BY MOUTH TWICE DAILY WITH A MEAL  ?  ? Endocrinology:  Diabetes - Biguanides Failed - 01/23/2022  8:02 AM  ?  ?  Failed - HBA1C is between 0 and 7.9 and within 180 days  ?  HbA1c, POC (controlled diabetic range)  ?Date Value Ref Range Status  ?07/18/2021 6.0 0.0 - 7.0 % Final  ?   ?  ?  Failed - B12 Level in normal range and within 720 days  ?  No results found for: VITAMINB12   ?  ?  Passed - Cr in normal range and within 360 days  ?  Creatinine, Ser  ?Date Value Ref Range Status  ?07/18/2021 1.18 0.76 - 1.27 mg/dL Final  ?   ?  ?  Passed - eGFR in normal range and within 360 days  ?  GFR calc Af Amer  ?Date Value Ref Range Status  ?12/14/2020 57 (L) >59 mL/min/1.73 Final  ?  Comment:  ?  **In accordance with recommendations from the NKF-ASN Task force,** ?  [facility] is in the process of updating its eGFR calculation to the ?  2021 CKD-EPI creatinine equation that estimates kidney function ?  without a race variable. ?  ? ?GFR, Estimated  ?Date Value Ref Range Status  ?05/05/2021 >60 >60 mL/min Final  ?  Comment:  ?  (NOTE) ?Calculated using the CKD-EPI Creatinine Equation (2021) ?  ? ?eGFR  ?Date Value Ref Range Status  ?07/18/2021 70 >59 mL/min/1.73 Final  ?   ?  ?  Passed - Valid encounter within last 6 months  ?  Recent Outpatient Visits   ?      ? 2 months ago Type 2 diabetes mellitus with other circulatory complications ([place])  ? [place] Plumber B, MD  ? 6 months ago Type 2 diabetes mellitus with other circulatory complications [facility])  ? [place] [name], MD  ? 9 months ago Type 2 diabetes mellitus with other circulatory complications [facility])  ? [place] [name], MD  ? 10 months ago Type 2 diabetes  mellitus with diabetic nephropathy, without long-term current use of insulin ([place])  ? [place] [name], MD  ? 1 year ago Type 2 diabetes mellitus with diabetic nephropathy, without long-term current use of insulin ([place])  ? [facility], PA-C  ?  ?  ? ?  ?  ?  Passed - CBC within normal limits and completed in the last 12 months  ?  WBC  ?Date Value Ref Range Status  ?07/18/2021 7.6 3.4 - 10.8 x10E3/uL Final  ?05/05/2021 12.8 (H) 4.0 - 10.5 K/uL Final  ? ?RBC  ?Date Value Ref Range Status  ?07/18/2021 4.12 (L) 4.14 - 5.80 x10E6/uL Final  ?05/05/2021 2.67 (L) 4.22 - 5.81 MIL/uL Final  ? ?Hemoglobin  ?Date Value Ref Range Status  ?07/18/2021 11.6 (L) 13.0 - 17.7 g/dL Final  ? ?Total hemoglobin  ?Date Value Ref Range Status  ?01/02/2021 11.7 (L) 12.0 - 16.0 g/dL Final  ? ?Hematocrit  ?Date Value Ref Range Status  ?07/18/2021 34.0 (L) 37.5 - 51.0 % Final  ? ?MCHC  ?Date Value Ref Range Status  ?07/18/2021  34.1 31.5 - 35.7 g/dL Final  ?05/05/2021 31.9 30.0 - 36.0 g/dL Final  ? ?MCH  ?Date Value Ref Range Status  ?07/18/2021 28.2 26.6 - 33.0 pg Final  ?05/05/2021 28.1 26.0 - 34.0 pg Final  ? ?MCV  ?Date Value Ref Range Status  ?07/18/2021 83 79 - 97 fL Final  ? ?No results found for: PLTCOUNTKUC, LABPLAT, [place] ?RDW  ?Date Value Ref Range Status  ?07/18/2021 14.2 11.6 - 15.4 % Final  ? ?  ?  ?  ? ?

## 2022-01-25 DIAGNOSIS — E1152 Type 2 diabetes mellitus with diabetic peripheral angiopathy with gangrene: Secondary | ICD-10-CM | POA: Diagnosis not present

## 2022-01-26 DIAGNOSIS — F320 Major depressive disorder, single episode, mild: Secondary | ICD-10-CM | POA: Diagnosis not present

## 2022-01-26 DIAGNOSIS — E1152 Type 2 diabetes mellitus with diabetic peripheral angiopathy with gangrene: Secondary | ICD-10-CM | POA: Diagnosis not present

## 2022-01-27 DIAGNOSIS — E1152 Type 2 diabetes mellitus with diabetic peripheral angiopathy with gangrene: Secondary | ICD-10-CM | POA: Diagnosis not present

## 2022-01-28 DIAGNOSIS — E1152 Type 2 diabetes mellitus with diabetic peripheral angiopathy with gangrene: Secondary | ICD-10-CM | POA: Diagnosis not present

## 2022-01-29 DIAGNOSIS — E1152 Type 2 diabetes mellitus with diabetic peripheral angiopathy with gangrene: Secondary | ICD-10-CM | POA: Diagnosis not present

## 2022-01-29 DIAGNOSIS — F320 Major depressive disorder, single episode, mild: Secondary | ICD-10-CM | POA: Diagnosis not present

## 2022-01-30 ENCOUNTER — Other Ambulatory Visit: Payer: Self-pay

## 2022-01-30 ENCOUNTER — Other Ambulatory Visit: Payer: Self-pay | Admitting: *Deleted

## 2022-01-30 ENCOUNTER — Telehealth: Payer: Self-pay | Admitting: *Deleted

## 2022-01-30 DIAGNOSIS — E1152 Type 2 diabetes mellitus with diabetic peripheral angiopathy with gangrene: Secondary | ICD-10-CM | POA: Diagnosis not present

## 2022-01-30 NOTE — Patient Outreach (Signed)
Care Coordination ? ?01/30/2022 ? ?[name] ?1956/05/06 ?[id number] ? ? ?Medicaid Managed Care  ? ?Unsuccessful Outreach Note ? ?01/30/2022 ?Name: Scott Greer MRN: [id number] DOB: 05-25-1956 ? ?Referred by: [name], MD ?Reason for referral : Case Closure (RNCM performing Case Closure for 3 or more unsuccessful outreach attempts) ? ? ?Three or more unsuccessful telephone outreach attempts have been made. The patient was referred to the case management team for assistance with care management and care coordination. The patient's primary care provider has been notified of our unsuccessful attempts to make or maintain contact with the patient. The care management team is pleased to engage with this patient at any time in the future should he/she be interested in assistance from the care management team.  ? ?Follow Up Plan: We have been unable to make contact with the patient for follow up. The care management team is available to follow up with the patient after provider conversation with the patient regarding recommendation for care management engagement and subsequent re-referral to the care management team.  ? ?Estanislado Emms RN, BSN ?[place]  [facility] ?RN Care Coordinator ? ? ?

## 2022-01-30 NOTE — Patient Outreach (Signed)
Care Coordination ? ?01/30/2022 ? ?[name] ?05/09/1956 ?[id number] ? ? ?Medicaid Managed Care  ? ?Unsuccessful Outreach Note ? ?01/30/2022 ?Name: Scott Greer MRN: [id number] DOB: [birth year] ? ?Referred by: [name], MD ?Reason for referral : No chief complaint on file. ? ? ?Third unsuccessful telephone outreach was attempted today. The patient was referred to the case management team for assistance with care management and care coordination. The patient's primary care provider has been notified of our unsuccessful attempts to make or maintain contact with the patient. The care management team is pleased to engage with this patient at any time in the future should he/she be interested in assistance from the care management team.  ? ?Follow Up Plan: We have been unable to make contact with the patient for follow up. The care management team is available to follow up with the patient after provider conversation with the patient regarding recommendation for care management engagement and subsequent re-referral to the care management team. [facility] will perform a Case Closure for multiple unsuccessful outreach attempts. ? ?Estanislado Emms RN, BSN ?[place]  [facility] ?RN Care Coordinator ? ? ?

## 2022-01-31 DIAGNOSIS — E1152 Type 2 diabetes mellitus with diabetic peripheral angiopathy with gangrene: Secondary | ICD-10-CM | POA: Diagnosis not present

## 2022-02-01 ENCOUNTER — Other Ambulatory Visit (HOSPITAL_COMMUNITY): Payer: Self-pay | Admitting: Cardiology

## 2022-02-01 ENCOUNTER — Other Ambulatory Visit: Payer: Self-pay | Admitting: Internal Medicine

## 2022-02-01 DIAGNOSIS — E1152 Type 2 diabetes mellitus with diabetic peripheral angiopathy with gangrene: Secondary | ICD-10-CM | POA: Diagnosis not present

## 2022-02-01 DIAGNOSIS — E1121 Type 2 diabetes mellitus with diabetic nephropathy: Secondary | ICD-10-CM

## 2022-02-02 DIAGNOSIS — E1152 Type 2 diabetes mellitus with diabetic peripheral angiopathy with gangrene: Secondary | ICD-10-CM | POA: Diagnosis not present

## 2022-02-03 DIAGNOSIS — Z419 Encounter for procedure for purposes other than remedying health state, unspecified: Secondary | ICD-10-CM | POA: Diagnosis not present

## 2022-02-05 DIAGNOSIS — F320 Major depressive disorder, single episode, mild: Secondary | ICD-10-CM | POA: Diagnosis not present

## 2022-02-06 DIAGNOSIS — E1152 Type 2 diabetes mellitus with diabetic peripheral angiopathy with gangrene: Secondary | ICD-10-CM | POA: Diagnosis not present

## 2022-02-07 ENCOUNTER — Other Ambulatory Visit (HOSPITAL_COMMUNITY): Payer: Self-pay

## 2022-02-07 DIAGNOSIS — E1152 Type 2 diabetes mellitus with diabetic peripheral angiopathy with gangrene: Secondary | ICD-10-CM | POA: Diagnosis not present

## 2022-02-08 ENCOUNTER — Telehealth (HOSPITAL_COMMUNITY): Payer: Self-pay | Admitting: Licensed Clinical Social Worker

## 2022-02-08 DIAGNOSIS — E1152 Type 2 diabetes mellitus with diabetic peripheral angiopathy with gangrene: Secondary | ICD-10-CM | POA: Diagnosis not present

## 2022-02-08 NOTE — Telephone Encounter (Signed)
CSW consulted to help get a hold of pt and help with barriers to coming back to clinic for appt ? ?Attempted to call but unable to reach- phone message states unable to accept calls at this time- will continue to attempt contact ? ?Burna Sis, LCSW ?Clinical Social Worker ?[facility] ?Desk#: [phone] ?Cell#: [phone] ? ?

## 2022-02-09 DIAGNOSIS — E1152 Type 2 diabetes mellitus with diabetic peripheral angiopathy with gangrene: Secondary | ICD-10-CM | POA: Diagnosis not present

## 2022-02-10 DIAGNOSIS — E1152 Type 2 diabetes mellitus with diabetic peripheral angiopathy with gangrene: Secondary | ICD-10-CM | POA: Diagnosis not present

## 2022-02-11 DIAGNOSIS — E1152 Type 2 diabetes mellitus with diabetic peripheral angiopathy with gangrene: Secondary | ICD-10-CM | POA: Diagnosis not present

## 2022-02-12 ENCOUNTER — Telehealth (HOSPITAL_COMMUNITY): Payer: Self-pay | Admitting: Licensed Clinical Social Worker

## 2022-02-12 DIAGNOSIS — E1152 Type 2 diabetes mellitus with diabetic peripheral angiopathy with gangrene: Secondary | ICD-10-CM | POA: Diagnosis not present

## 2022-02-12 DIAGNOSIS — F320 Major depressive disorder, single episode, mild: Secondary | ICD-10-CM | POA: Diagnosis not present

## 2022-02-12 NOTE — Telephone Encounter (Signed)
CSW attempted to call pt to discuss getting re-established with clinic.  Unable to reach- left VM to request return call ? ?Jorge Ny, LCSW ?Clinical Social Worker ?[facility] ?Desk#: [phone] ?Cell#: [phone] ? ?

## 2022-02-13 DIAGNOSIS — E1152 Type 2 diabetes mellitus with diabetic peripheral angiopathy with gangrene: Secondary | ICD-10-CM | POA: Diagnosis not present

## 2022-02-14 DIAGNOSIS — E1152 Type 2 diabetes mellitus with diabetic peripheral angiopathy with gangrene: Secondary | ICD-10-CM | POA: Diagnosis not present

## 2022-02-15 DIAGNOSIS — E1152 Type 2 diabetes mellitus with diabetic peripheral angiopathy with gangrene: Secondary | ICD-10-CM | POA: Diagnosis not present

## 2022-02-16 DIAGNOSIS — E1152 Type 2 diabetes mellitus with diabetic peripheral angiopathy with gangrene: Secondary | ICD-10-CM | POA: Diagnosis not present

## 2022-02-17 DIAGNOSIS — E1152 Type 2 diabetes mellitus with diabetic peripheral angiopathy with gangrene: Secondary | ICD-10-CM | POA: Diagnosis not present

## 2022-02-19 ENCOUNTER — Telehealth: Payer: Self-pay | Admitting: Orthopedic Surgery

## 2022-02-19 ENCOUNTER — Telehealth (HOSPITAL_COMMUNITY): Payer: Self-pay | Admitting: Licensed Clinical Social Worker

## 2022-02-19 ENCOUNTER — Other Ambulatory Visit (HOSPITAL_COMMUNITY): Payer: Self-pay

## 2022-02-19 ENCOUNTER — Telehealth: Payer: Self-pay | Admitting: *Deleted

## 2022-02-19 DIAGNOSIS — E1152 Type 2 diabetes mellitus with diabetic peripheral angiopathy with gangrene: Secondary | ICD-10-CM | POA: Diagnosis not present

## 2022-02-19 NOTE — Patient Outreach (Signed)
Care Coordination ? ?02/19/2022 ? ?Scott Greer ?December 07, 1955 ?[id number] ? ?RNCM returning call to patient. Scott Greer left a voicemail this afternoon for [facility] stating that he is currently homeless, living in his car, does not have all of his medications, not attending provider appointments, not eating properly, and now having swelling in both legs. He plans to go to the ED today. ? ?RNCM left a detailed message for Scott Greer and will attempt to reach him tomorrow.  ? ?Scott Emms RN, BSN ?[place]  [facility] ?RN Care Coordinator ? ?  ?

## 2022-02-19 NOTE — Telephone Encounter (Signed)
Pt called to inform Dr. Lajoyce Corners both his amputated legs are swollen and is going to the Ed to see if they can admit him because he is homeless right now and been sleeping in his car. Pt is asking for Dr. Lajoyce Corners to call him and help him some how. Pt phone number is [phone] ?
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
## 2022-02-20 ENCOUNTER — Emergency Department (HOSPITAL_COMMUNITY)
Admission: EM | Admit: 2022-02-20 | Discharge: 2022-02-20 | Disposition: A | Discharge status: Home / Self Care | Payer: Medicaid Other | Attending: Emergency Medicine | Admitting: Emergency Medicine

## 2022-02-20 ENCOUNTER — Telehealth: Payer: Self-pay | Admitting: *Deleted

## 2022-02-20 ENCOUNTER — Encounter (HOSPITAL_COMMUNITY): Payer: Self-pay

## 2022-02-20 DIAGNOSIS — Z7984 Long term (current) use of oral hypoglycemic drugs: Secondary | ICD-10-CM | POA: Insufficient documentation

## 2022-02-20 DIAGNOSIS — M79605 Pain in left leg: Secondary | ICD-10-CM | POA: Diagnosis not present

## 2022-02-20 DIAGNOSIS — M79604 Pain in right leg: Secondary | ICD-10-CM | POA: Diagnosis not present

## 2022-02-20 DIAGNOSIS — Z7982 Long term (current) use of aspirin: Secondary | ICD-10-CM | POA: Diagnosis not present

## 2022-02-20 DIAGNOSIS — E876 Hypokalemia: Secondary | ICD-10-CM | POA: Insufficient documentation

## 2022-02-20 DIAGNOSIS — Z5900 Homelessness unspecified: Secondary | ICD-10-CM | POA: Insufficient documentation

## 2022-02-20 DIAGNOSIS — F320 Major depressive disorder, single episode, mild: Secondary | ICD-10-CM | POA: Diagnosis not present

## 2022-02-20 DIAGNOSIS — E1165 Type 2 diabetes mellitus with hyperglycemia: Secondary | ICD-10-CM | POA: Insufficient documentation

## 2022-02-20 DIAGNOSIS — E1152 Type 2 diabetes mellitus with diabetic peripheral angiopathy with gangrene: Secondary | ICD-10-CM | POA: Diagnosis not present

## 2022-02-20 LAB — CBC
HCT: 34.1 % — ABNORMAL LOW (ref 39.0–52.0)
Hemoglobin: 11.1 g/dL — ABNORMAL LOW (ref 13.0–17.0)
MCH: 29.1 pg (ref 26.0–34.0)
MCHC: 32.6 g/dL (ref 30.0–36.0)
MCV: 89.3 fL (ref 80.0–100.0)
Platelets: 220 10*3/uL (ref 150–400)
RBC: 3.82 MIL/uL — ABNORMAL LOW (ref 4.22–5.81)
RDW: 14.3 % (ref 11.5–15.5)
WBC: 4.7 10*3/uL (ref 4.0–10.5)
nRBC: 0 % (ref 0.0–0.2)

## 2022-02-20 LAB — COMPREHENSIVE METABOLIC PANEL
ALT: 10 U/L (ref 0–44)
AST: 15 U/L (ref 15–41)
Albumin: 2.8 g/dL — ABNORMAL LOW (ref 3.5–5.0)
Alkaline Phosphatase: 40 U/L (ref 38–126)
Anion gap: 5 (ref 5–15)
BUN: 14 mg/dL (ref 8–23)
CO2: 27 mmol/L (ref 22–32)
Calcium: 8.6 mg/dL — ABNORMAL LOW (ref 8.9–10.3)
Chloride: 106 mmol/L (ref 98–111)
Creatinine, Ser: 1.3 mg/dL — ABNORMAL HIGH (ref 0.61–1.24)
GFR, Estimated: >60 mL/min (ref >60)
Glucose, Bld: 199 mg/dL — ABNORMAL HIGH (ref 70–99)
Potassium: 3.2 mmol/L — ABNORMAL LOW (ref 3.5–5.1)
Sodium: 138 mmol/L (ref 135–145)
Total Bilirubin: 0.5 mg/dL (ref 0.3–1.2)
Total Protein: 5.4 g/dL — ABNORMAL LOW (ref 6.5–8.1)

## 2022-02-20 MED ORDER — OXYCODONE-ACETAMINOPHEN 5-325 MG PO TABS
1.0000 | ORAL_TABLET | Freq: Once | ORAL | Status: AC
  Administered 2022-02-20: 1 via ORAL
  Filled 2022-02-20: qty 1

## 2022-02-20 MED ORDER — POTASSIUM CHLORIDE CRYS ER 20 MEQ PO TBCR
40.0000 meq | EXTENDED_RELEASE_TABLET | Freq: Once | ORAL | Status: AC
  Administered 2022-02-20: 40 meq via ORAL
  Filled 2022-02-20: qty 2

## 2022-02-20 NOTE — Telephone Encounter (Signed)
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 Also advised that while he is there at the hospital there are social workers and case managers that may be able to help his living situation. I saw several notes that he is working with someone on this and to call if there is anything else that I can do.  ?

## 2022-02-20 NOTE — Discharge Instructions (Addendum)
It was a pleasure taking care of you today! ? ?Your labs were notable for low potassium which was treated in the ED.  Otherwise your labs are unremarkable.  It is important that you maintain follow-up with your care management team consistent of [place]a [place] and [place].  Additionally attached is information for shelters locally in the area.  Call your primary care provider to set up a follow-up appoint regarding today's ED visit.  Return to the emergency department for experiencing increasing/worsening fever, pain, swelling, worsening symptoms. ?

## 2022-02-20 NOTE — ED Notes (Signed)
Pt provided discharge instructions and prescription information. Pt was given the opportunity to ask questions and questions were answered. Discharge signature not obtained in the setting of the COVID-19 pandemic in order to reduce high touch surfaces.  ° °
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
## 2022-02-20 NOTE — ED Provider Triage Note (Signed)
Emergency Medicine Provider Triage Evaluation Note ? ?Scott Greer , a 66 y.o. male  was evaluated in triage.  Pt complains of bilateral leg pain and swelling x 3-4 days. Reports pain is constant, worse with movement, no alleviating factors. Has become homeless and is living out of his car which he thinks is contributing as he is sleeping there. ? ?Review of Systems  ?Positive: Leg pain/swelling ?Negative: Fever, chest pain, dyspnea, injuyr ? 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 TTP distally ?Other:  Dopplerable popliteal pulses.  ? ?Medical Decision Making  ?Medically screening exam initiated at 3:33 AM.  Appropriate orders placed.  Scott Greer was informed that the remainder of the evaluation will be completed by another provider, this initial triage assessment does not replace that evaluation, and the importance of remaining in the ED until their evaluation is complete. ? ?Leg pain ?  ?Cherly Anderson, PA-C ?02/20/22 0335 ? ?

## 2022-02-20 NOTE — Patient Instructions (Signed)
Visit Information  [name]  - as a part of your Medicaid benefit, you are eligible for care management and care coordination services at no cost or copay. I was unable to reach you by phone today but would be happy to help you with your health related needs. Please feel free to call me @ [phone].   A member of the Managed Medicaid care management team will reach out to you again over the next 7 days.   Kenta Laster RN, BSN [place]  [facility] RN Care Coordinator   

## 2022-02-20 NOTE — Patient Outreach (Signed)
Care Coordination ? ?02/20/2022 ? ?[name] ?[birth year] ?[id number] ? ? ?Medicaid Managed Care  ? ?Unsuccessful Outreach Note ? ?02/20/2022 ?Name: Scott Greer MRN: [id number] DOB: 03-03-1956 ? ?Referred by: [name], MD ?Reason for referral : No chief complaint on file. ? ? ?A second unsuccessful telephone outreach was attempted today. The patient was referred to the case management team for assistance with care management and care coordination.  ? ?Follow Up Plan: A HIPAA compliant phone message was left for the patient providing contact information and requesting a return call.  ?The care management team will reach out to the patient again over the next 7 days.  ? ?Estanislado Emms RN, BSN ?[place]  [facility] ?RN Care Coordinator ? ? ?

## 2022-02-20 NOTE — ED Provider Notes (Signed)
?[facility] ?Provider Note ? ? ?CSN: [id number] ?Arrival date & time: 02/20/22  0247 ? ?  ? ?History ? ?Chief Complaint  ?Patient presents with  ? Leg Pain  ? ? ?Scott Greer is a 67 y.o. male who presents to the emergency department complaining of bilateral leg pain and swelling onset 2 weeks.  He notes that his pain is constant.  Patient notes he is currently homeless and living out of his car.  He has not been able to wear his bilateral prosthetic legs due to an increase in pain and swelling.  Patient notes that he has been walking on his bilateral stumps due to being homeless and has noticed increased pain and swelling with doing that.  He has a past medical history of diabetes and notes that he is not as compliant as he could be with his medications due to not having to wait to get his meds.  Denies fever, chills, drainage, redness, chest pain, shortness of breath.  Denies recent injury. ? ?The history is provided by the patient. No language interpreter was used.  ? ?  ? ?Home Medications ?Prior to Admission medications   ?Medication Sig Start Date End Date Taking? Authorizing Provider  ?amiodarone (PACERONE) 200 MG tablet Take 1 tablet (200 mg total) by mouth daily. 10/31/21   [name], MD  ?aspirin (ASPIRIN LOW DOSE) 81 MG chewable tablet Chew 1 tablet (81 mg total) by mouth daily. 10/31/21   [name], MD  ?atorvastatin (LIPITOR) 10 MG tablet Take 1 tablet (10 mg total) by mouth daily with supper. ?Patient not taking: Reported on 12/01/2021 11/02/21   [name], MD  ?BIDIL 20-37.5 MG tablet TAKE ONE TABLET BY MOUTH THREE TIMES DAILY Needs appointment for further refills 02/06/22   [name], MD  ?Blood Glucose Monitoring Suppl (TRUE METRIX METER) w/Device KIT Use as directed ?Patient not taking: Reported on 11/20/2021 10/23/19   [name], MD  ?carvedilol (COREG) 3.125 MG tablet TAKE ONE TABLET BY MOUTH EVERY MORNING and TAKE ONE  TABLET BY MOUTH EVERY EVENING WITH A MEAL 12/26/21   [name], MD  ?digoxin (LANOXIN) 0.125 MG tablet Take 1 tablet (0.125 mg total) by mouth daily with supper. 10/31/21   [name], MD  ?empagliflozin (JARDIANCE) 10 MG TABS tablet Take 1 tablet (10 mg total) by mouth daily. 10/31/21   [name], MD  ?FEROSUL 325 (65 Fe) MG tablet TAKE ONE TABLET BY MOUTH EVERY MORNING WITH BREAKFAST ?Patient not taking: Reported on 12/01/2021 10/28/21   [name], MD  ?gabapentin (NEURONTIN) 300 MG capsule TAKE ONE CAPSULE BY MOUTH THREE TIMES DAILY 12/27/21   [name], MD  ?glucose blood (TRUE METRIX BLOOD GLUCOSE TEST) test strip Use as instructed ?Patient not taking: Reported on 11/20/2021 10/23/19   [name], MD  ?metFORMIN (GLUCOPHAGE) 500 MG tablet TAKE ONE TABLET BY MOUTH TWICE DAILY WITH A MEAL 02/01/22   [name], MD  ?nutrition supplement, JUVEN, (JUVEN) PACK Take 1 packet by mouth 2 (two) times daily between meals. ?Patient not taking: Reported on 11/20/2021 04/17/21   Persons, [name], [name]  ?oxyCODONE (ROXICODONE) 5 MG immediate release tablet Take 1 tablet (5 mg total) by mouth every 8 (eight) hours as needed for severe pain. ?Patient not taking: Reported on 12/01/2021 11/07/21   [name], NP  ?potassium chloride SA (KLOR-CON M) 20 MEQ tablet Take 1 tablet (20  mEq total) by mouth daily. LAST REFILL NEEDS TO KEEP FOLLOW UP APPOINTMENT FOR ANYMORE REFILLS 02/06/22   [name], MD  ?sacubitril-valsartan (ENTRESTO) 24-26 MG Take 1 tablet by mouth 2 (two) times daily. LAST REFILL NEEDS TO KEEP FOLLOW UP APPOINTMENT FOR ANYMORE REFILLS 02/06/22   [name], MD  ?spironolactone (ALDACTONE) 25 MG tablet TAKE ONE TABLET BY MOUTH EVERY MORNING Needs appointment for further refills 02/06/22   [name], MD  ?torsemide (DEMADEX) 20 MG tablet Take 1 tablet (20 mg total) by mouth daily. LAST REFILL NEEDS TO KEEP FOLLOW UP APPOINTMENT FOR ANYMORE REFILLS 02/06/22    [name], MD  ?TRUEplus Lancets 28G MISC Use as directed ?Patient not taking: Reported on 11/20/2021 10/23/19   [name], MD  ?Vitamin D, Ergocalciferol, (DRISDOL) 1.25 MG (50000 UNIT) CAPS capsule TAKE ONE CAPSULE BY MOUTH ONCE WEEKLY ON SUNDAY ?Patient not taking: Reported on 12/01/2021 08/03/21   [name], MD  ?zinc sulfate 220 (50 Zn) MG capsule Take 1 capsule (220 mg total) by mouth daily. ?Patient not taking: Reported on 08/01/2021 04/17/21   Persons, [name], [name]  ?   ? ?Allergies    ?Bee venom   ? ?Review of Systems   ?Review of Systems  ?Constitutional:  Negative for chills and fever.  ?Respiratory:  Negative for shortness of breath.   ?Cardiovascular:  Negative for chest pain.  ?Musculoskeletal:  Positive for arthralgias.  ?Skin:  Negative for color change and wound.  ?All other systems reviewed and are negative. ? ?Physical Exam ?Updated Vital Signs ?BP (!) 161/108 (BP Location: Right Arm)   Pulse 66   Temp 97.7 ?F (36.5 ?C) (Oral)   Resp 20   SpO2 100%  ?Physical Exam ?Vitals and nursing note reviewed.  ?Constitutional:   ?   General: He is not in acute distress. ?   Appearance: He is not diaphoretic.  ?HENT:  ?   Head: Normocephalic and atraumatic.  ?   Mouth/Throat:  ?   Pharynx: No oropharyngeal exudate.  ?Eyes:  ?   General: No scleral icterus. ?   Conjunctiva/sclera: Conjunctivae normal.  ?Cardiovascular:  ?   Rate and Rhythm: Normal rate and regular rhythm.  ?   Pulses: Normal pulses.  ?   Heart sounds: Normal heart sounds.  ?Pulmonary:  ?   Effort: Pulmonary effort is normal. No respiratory distress.  ?   Breath sounds: Normal breath sounds. No wheezing.  ?Abdominal:  ?   General: Bowel sounds are normal.  ?   Palpations: Abdomen is soft. There is no mass.  ?   Tenderness: There is no abdominal tenderness. There is no guarding or rebound.  ?Musculoskeletal:     ?   General: Normal range of motion.  ?   Cervical back: Normal range of motion and neck supple.  ?    Comments: Bilateral BKA.  Mild edema noted to bilateral stumps.  No erythema or drainage noted to bilateral stumps.  Tenderness to palpation noted to distal bilateral stumps.  ?Skin: ?   General: Skin is warm and dry.  ?Neurological:  ?   Mental Status: He is alert.  ?Psychiatric:     ?   Behavior: Behavior normal.  ? ? ?ED Results / Procedures / Treatments   ?Labs ?(all labs ordered are listed, but only abnormal results are displayed) ?Labs Reviewed  ?COMPREHENSIVE METABOLIC PANEL - Abnormal; Notable for the following components:  ?    Result Value  ?  Potassium 3.2 (*)   ? Glucose, Bld 199 (*)   ? Creatinine, Ser 1.30 (*)   ? Calcium 8.6 (*)   ? Total Protein 5.4 (*)   ? Albumin 2.8 (*)   ? All other components within normal limits  ?CBC - Abnormal; Notable for the following components:  ? RBC 3.82 (*)   ? Hemoglobin 11.1 (*)   ? HCT 34.1 (*)   ? All other components within normal limits  ? ? ?EKG ?None ? ?Radiology ?No results found. ? ?Procedures ?Procedures  ? ? ?Medications Ordered in ED ?Medications  ?oxyCODONE-acetaminophen (PERCOCET/ROXICET) 5-325 MG per tablet 1 tablet (1 tablet Oral Given 02/20/22 1056)  ?potassium chloride SA (KLOR-CON M) CR tablet 40 mEq (40 mEq Oral Given 02/20/22 1354)  ? ? ?ED Course/ Medical Decision Making/ A&P ?Clinical Course as of 02/20/22 1537  ?Tue Feb 20, 2022  ?1051 Discussed with patient regarding treatment plan of potassium tablet, Percocet, as well as consult with Education officer, museum.  Answered all available questions.  Patient agreeable at this time. [SB]  ?11 Discussed with patient's outpatient case manager RN, Melanie regarding patient case.  Threasa Beards notes that they have attempted to get patient with outpatient medication management as well as emergency housing.  Upon Melanie chart review she notes that patient was given emergency housing however it was discontinued. [SB]  ?[place] with Larene Beach, ED LCSW who notes that patient is already followed by case management team  with [facility] and the heart failure clinic.  Also notes that upon her evaluation and discussion with the patient, patient notes that he was kicked out of [facility] housing due to not following the rules approxi

## 2022-02-20 NOTE — ED Notes (Signed)
Called x 3 NO answer 

## 2022-02-20 NOTE — ED Triage Notes (Signed)
Pt states that he has been having bilateral leg pain and swelling for the past few days and is unable to get his prosthetics on  ?

## 2022-02-21 ENCOUNTER — Telehealth: Payer: Self-pay | Admitting: Internal Medicine

## 2022-02-21 ENCOUNTER — Telehealth: Payer: Self-pay

## 2022-02-21 ENCOUNTER — Telehealth: Payer: Self-pay | Admitting: Orthopedic Surgery

## 2022-02-21 ENCOUNTER — Telehealth: Payer: Self-pay | Admitting: *Deleted

## 2022-02-21 DIAGNOSIS — E1152 Type 2 diabetes mellitus with diabetic peripheral angiopathy with gangrene: Secondary | ICD-10-CM | POA: Diagnosis not present

## 2022-02-21 NOTE — Telephone Encounter (Signed)
Pt called and states that he needs someone to call him. He states his leg is really hurting and is swollen. He said his leg is so swollen he can't get it in the prosthesis.  ? ?Cb [phone] ?

## 2022-02-21 NOTE — Telephone Encounter (Signed)
Transition Care Management Follow-up Telephone Call ?Date of discharge and from where: 02/21/2022-[facility]  ?How have you been since you were released from the hospital? Pt stated he is doing ok.  ?Any questions or concerns? No ? ?Items Reviewed: ?Did the pt receive and understand the discharge instructions provided? Yes  ?Medications obtained and verified?  No medications sent to pharmacy. ?Other? No  ?Any new allergies since your discharge? No  ?Dietary orders reviewed? No ?Do you have support at home? Yes  ? ?Home Care and Equipment/Supplies: ?Were home health services ordered? not applicable ?If so, what is the name of the agency? N/A  ?Has the agency set up a time to come to the patient's home? not applicable ?Were any new equipment or medical supplies ordered?  No ?What is the name of the medical supply agency? N/A ?Were you able to get the supplies/equipment? not applicable ?Do you have any questions related to the use of the equipment or supplies? No ? ?Functional Questionnaire: (I = Independent and D = Dependent) ?ADLs: I ? ?Bathing/Dressing- I ? ?Meal Prep- I ? ?Eating- I ? ?Maintaining continence- I ? ?Transferring/Ambulation- I ? ?Managing Meds- I ? ?Follow up appointments reviewed: ? ?PCP Hospital f/u appt confirmed? No   ?Specialist Hospital f/u appt confirmed? No   ?Are transportation arrangements needed? No  ?If their condition worsens, is the pt aware to call PCP or go to the Emergency Dept.? Yes ?Was the patient provided with contact information for the PCP's office or ED? Yes ?Was to pt encouraged to call back with questions or concerns? Yes  ?

## 2022-02-21 NOTE — Patient Outreach (Signed)
Care Coordination ? ?02/21/2022 ? ?Scott Greer ?16-Oct-1956 ?[id number] ? ?RNCM attempting to reach [name] in response to a voicemail. RNCM left a detailed message, including contact information as well as information for Safe Parking available 7pm to 7am, [facility] in [place]. Resources for housing, medical, education and employment are available. This location offers restrooms and security. RNCM will attempt to reach patient again over the next 7 days. ? ?[name] RN, BSN ?[place] ?RN Care Coordinator ? ?

## 2022-02-21 NOTE — Telephone Encounter (Signed)
.. ?  Medicaid Managed Care  ? ?Unsuccessful Outreach Note ? ?02/21/2022 ?Name: Scott Greer MRN: [id number] DOB: [birth year] ? ?Referred by: [name], MD ?Reason for referral : High Risk Managed Medicaid (I called the patient today to get him scheduled with the MM RNCM. I left a message on his VM with my number.) ? ? ?A second unsuccessful telephone outreach was attempted today. The patient was referred to the case management team for assistance with care management and care coordination.  ? ?Follow Up Plan: The care management team will reach out to the patient again over the next 7 days.  ? ? ?Weston Settle ?Care Guide, High Risk Medicaid Managed Care ?Embedded Care Coordination ?[place]  [facility]  ? ? ?SIGNATURE  ?

## 2022-02-22 DIAGNOSIS — E1152 Type 2 diabetes mellitus with diabetic peripheral angiopathy with gangrene: Secondary | ICD-10-CM | POA: Diagnosis not present

## 2022-02-23 DIAGNOSIS — E1152 Type 2 diabetes mellitus with diabetic peripheral angiopathy with gangrene: Secondary | ICD-10-CM | POA: Diagnosis not present

## 2022-02-24 DIAGNOSIS — E1152 Type 2 diabetes mellitus with diabetic peripheral angiopathy with gangrene: Secondary | ICD-10-CM | POA: Diagnosis not present

## 2022-02-26 DIAGNOSIS — E1152 Type 2 diabetes mellitus with diabetic peripheral angiopathy with gangrene: Secondary | ICD-10-CM | POA: Diagnosis not present

## 2022-02-27 ENCOUNTER — Ambulatory Visit: Payer: Self-pay

## 2022-02-27 ENCOUNTER — Other Ambulatory Visit: Payer: Self-pay | Admitting: *Deleted

## 2022-02-27 NOTE — Telephone Encounter (Signed)
?  Chief Complaint: Needs transportation to appointment ?Symptoms:  ?Frequency:  ?Pertinent Negatives: Patient denies any change in legs - Pt does report increased urination. ?Disposition: [] ED /[] Urgent Care (no appt availability in office) / [] Appointment(In office/virtual)/ []  [facility]/ [] Home Care/ [] Refused Recommended Disposition /[] [facility]/ [x]  Follow-up with PCP ?Additional Notes:   ? ?Pt states there is no change in in his legs from when he was at the ED.  ? ?Pt does need transportation to get to his appointment with Dr. Wynetta Emery on Thursday. ? ?Pt is staying in his car at : ?[facility] ?[place].  ?[place]. ? ?Please call pt back to set up transportation ? ?Protocols used: Information Only Call - No Triage-A-AH ? ? ? ? ? ? ? ?Summary: legs swelling.  ? Pt stated both of his legs are swelling. pt stated he isn't eating correctly because he is homeless and living in his car.   ?Pt seeking clinical advice.  ? ?----- Message from [facility] sent at 02/27/2022  1:11 PM EDT -----  ?Pt stated both of his legs are swelling. pt stated he isn't eating correctly because he is homeless and living in his car.   ?Pt seeking clinical advice.   ?  ? ?Answer Assessment - Initial Assessment Questions ?1. REASON FOR CALL or QUESTION: "What is your reason for calling today?" or "How can I best help you?" or "What question do you have that I can help answer?" ?    Pt states there is no change in in his legs from when he was at the ED.  ? ?Pt does need transportation to get to his appointment with Dr. Wynetta Emery on Thursday. ? ?Pt is staying in his car at : ?[facility] ?[place].  ?[place]. ? ?Protocols used: Information Only Call - No Triage-A-AH ? ?

## 2022-02-27 NOTE — Telephone Encounter (Signed)
Summary: legs swelling.  ? Pt stated both of his legs are swelling. pt stated he isn't eating correctly because he is homeless and living in his car.   ?Pt seeking clinical advice.  ? ?----- Message from [facility] sent at 02/27/2022  1:11 PM EDT -----  ?Pt stated both of his legs are swelling. pt stated he isn't eating correctly because he is homeless and living in his car.   ?Pt seeking clinical advice.   ?  ?Called pt and LMOM to return our call. ?

## 2022-02-27 NOTE — Telephone Encounter (Signed)
This pt had been evaluated in the ER day before. The pt is difficult to try and reach by phone. We can see him in the office for follow up any time. He has several social workers and Conservation officer, historic buildings working to arrange his care. Will you continue to reach out and try and make appt for follow up in the office with Dr. Sharol Given. He would need to be seen to eval pain and swelling ?

## 2022-02-27 NOTE — Patient Outreach (Signed)
?Medicaid Managed Care   ?Nurse Care Manager Note ? ?02/27/2022 ?Name:  Scott Greer MRN:  [id number] DOB:  Nov 23, 1955 ? ?Scott Greer is an 66 y.o. year old male who is a primary patient of [name], MD.  The [facility] team was consulted for assistance with:    ?DMII ?Homelessness ?Stress ? ?Scott Greer was given information about Medicaid Managed Care Coordination team services today. Scott Greer Patient agreed to services and verbal consent obtained. ? ?Engaged with patient by telephone for initial visit in response to provider referral for case management and/or care coordination services.  ? ?Assessments/Interventions:  Review of past medical history, allergies, medications, health status, including review of consultants reports, laboratory and other test data, was performed as part of comprehensive evaluation and provision of chronic care management services. ? ?SDOH (Social Determinants of Health) assessments and interventions performed: ?SDOH Interventions   ? ?Flowsheet Row Most Recent Value  ?SDOH Interventions   ?Stress Interventions Other (Comment)  [Referral to LCSW]  ? ?  ? ? ?Care Plan ? ?Allergies  ?Allergen Reactions  ? Bee Venom Hives, Itching and Swelling  ? ? ?Medications Reviewed Today   ? ? Reviewed by [name], RN (Registered Nurse) on 02/27/22 at 1205  Med List Status: <None>  ? ?Medication Order Taking? Sig Documenting Provider Last Dose Status Informant  ?amiodarone (PACERONE) 200 MG tablet [id number] No Take 1 tablet (200 mg total) by mouth daily.  ?Patient not taking: Reported on 02/27/2022  ? [name], MD Not Taking Active   ?aspirin (ASPIRIN LOW DOSE) 81 MG chewable tablet [id number] No Chew 1 tablet (81 mg total) by mouth daily.  ?Patient not taking: Reported on 02/27/2022  ? [name], MD Not Taking Active   ?atorvastatin (LIPITOR) 10 MG tablet [id number] No Take 1 tablet (10 mg total) by mouth daily with supper.  ?Patient not  taking: Reported on 12/01/2021  ? [name], MD Not Taking Active   ?BIDIL 20-37.5 MG tablet [id number] No TAKE ONE TABLET BY MOUTH THREE TIMES DAILY Needs appointment for further refills  ?Patient not taking: Reported on 02/27/2022  ? [name], MD Not Taking Active   ?Blood Glucose Monitoring Suppl (TRUE METRIX METER) w/Device KIT [id number] No Use as directed  ?Patient not taking: Reported on 11/20/2021  ? [name], MD Not Taking Active Other  ?carvedilol (COREG) 3.125 MG tablet [id number] No TAKE ONE TABLET BY MOUTH EVERY MORNING and TAKE ONE TABLET BY MOUTH EVERY EVENING WITH A MEAL  ?Patient not taking: Reported on 02/27/2022  ? [name], MD Not Taking Active   ?digoxin (LANOXIN) 0.125 MG tablet [id number] No Take 1 tablet (0.125 mg total) by mouth daily with supper.  ?Patient not taking: Reported on 02/27/2022  ? [name], MD Not Taking Active   ?empagliflozin (JARDIANCE) 10 MG TABS tablet [id number] No Take 1 tablet (10 mg total) by mouth daily.  ?Patient not taking: Reported on 02/27/2022  ? [name], MD Not Taking Active   ?FEROSUL 325 (65 Fe) MG tablet [id number] No TAKE ONE TABLET BY MOUTH EVERY MORNING WITH BREAKFAST  ?Patient not taking: Reported on 12/01/2021  ? [name], MD Not Taking Active   ?gabapentin (NEURONTIN) 300 MG capsule [id number] No TAKE ONE CAPSULE BY MOUTH THREE TIMES DAILY  ?Patient not taking: Reported on 02/27/2022  ? [name], MD Not Taking Active   ?glucose blood (TRUE  METRIX BLOOD GLUCOSE TEST) test strip [id number] No Use as instructed  ?Patient not taking: Reported on 11/20/2021  ? [name], MD Not Taking Active Other  ?metFORMIN (GLUCOPHAGE) 500 MG tablet [id number] No TAKE ONE TABLET BY MOUTH TWICE DAILY WITH A MEAL  ?Patient not taking: Reported on 02/27/2022  ? [name], MD Not Taking Active   ?nutrition supplement, [name]) PACK [id number] No Take 1 packet by mouth 2 (two) times daily between meals.   ?Patient not taking: Reported on 11/20/2021  ? Persons, [name], [name] Not Taking Active   ?oxyCODONE (ROXICODONE) 5 MG immediate release tablet [id number] No Take 1 tablet (5 mg total) by mouth every 8 (eight) hours as needed for severe pain.  ?Patient not taking: Reported on 12/01/2021  ? [name], NP Not Taking Active   ?potassium chloride SA (KLOR-CON M) 20 MEQ tablet [id number] Yes Take 1 tablet (20 mEq total) by mouth daily. LAST REFILL NEEDS TO KEEP FOLLOW UP APPOINTMENT FOR ANYMORE REFILLS [name], MD Taking Active   ?sacubitril-valsartan (ENTRESTO) 24-26 MG [id number] No Take 1 tablet by mouth 2 (two) times daily. LAST REFILL NEEDS TO KEEP FOLLOW UP APPOINTMENT FOR ANYMORE REFILLS  ?Patient not taking: Reported on 02/27/2022  ? [name], MD Not Taking Active   ?spironolactone (ALDACTONE) 25 MG tablet [id number] No TAKE ONE TABLET BY MOUTH EVERY MORNING Needs appointment for further refills  ?Patient not taking: Reported on 02/27/2022  ? [name], MD Not Taking Active   ?torsemide (DEMADEX) 20 MG tablet [id number] No Take 1 tablet (20 mg total) by mouth daily. LAST REFILL NEEDS TO KEEP FOLLOW UP APPOINTMENT FOR ANYMORE REFILLS  ?Patient not taking: Reported on 02/27/2022  ? [name], MD Not Taking Active   ?TRUEplus Lancets 28G MISC [id number] No Use as directed  ?Patient not taking: Reported on 11/20/2021  ? [name], MD Not Taking Active Other  ?Vitamin D, Ergocalciferol, (DRISDOL) 1.25 MG (50000 UNIT) CAPS capsule [id number] No TAKE ONE CAPSULE BY MOUTH ONCE WEEKLY ON SUNDAY  ?Patient not taking: Reported on 12/01/2021  ? [name], MD Not Taking Active   ?zinc sulfate 220 (50 Zn) MG capsule [id number] No Take 1 capsule (220 mg total) by mouth daily.  ?Patient not taking: Reported on 08/01/2021  ? Persons, [name], [name] Not Taking Active   ? ?  ?  ? ?  ? ? ?Patient Active Problem List  ? Diagnosis Date Noted  ? Chronic combined systolic and diastolic CHF  (congestive heart failure) ([place]) 12/01/2021  ? S/P BKA (below knee amputation) bilateral ([place]) 07/18/2021  ? Acute hematogenous osteomyelitis of right foot ([place]) 04/26/2021  ? Gangrene of right foot ([place])   ? Foot pain, right 04/14/2021  ? Subacute osteomyelitis, right ankle and foot ([place])   ? Status post below-knee amputation ([place]) 02/17/2021  ? Wound dehiscence   ? Gangrene of toe of left foot ([place])   ? Cutaneous abscess of left foot   ? Left foot infection 02/07/2021  ? Leukocytosis 02/07/2021  ? Thrombocytosis 02/07/2021  ? Hyponatremia 02/07/2021  ? AKI (acute kidney injury) ([place]) 02/07/2021  ? Hyperglycemia due to diabetes mellitus ([place]) 02/07/2021  ? Hypoalbuminemia due to protein-calorie malnutrition ([place]) 02/07/2021  ? Osteomyelitis of second toe of left foot ([place])   ? New onset of congestive heart failure ([place]) 12/26/2020  ? CKD (chronic kidney disease) stage 3, GFR 30-59 ml/min ([facility]) 12/26/2020  ? Acute  systolic CHF (congestive heart failure) ([place]) 12/26/2020  ? Anemia, chronic disease 06/22/2020  ? Amputee, great toe, right ([place]) 06/20/2020  ? Normocytic anemia 06/20/2020  ? Erectile dysfunction associated with type 2 diabetes mellitus ([place]) 06/20/2020  ? Positive for macroalbuminuria 10/24/2019  ? Amputation of left great toe ([place]) 10/23/2019  ? Tobacco dependence 10/23/2019  ? Osteomyelitis of great toe of right foot ([place]) 06/04/2019  ? Diabetic foot infection ([place])   ? Noncompliance   ? Glaucoma   ? Hyperlipidemia   ? Essential hypertension 11/06/2003  ? Diabetes mellitus ([place]) 11/05/1997  ? ? ?Conditions to be addressed/monitored per PCP order:  DMII, Homelessness, and Stress ? ?Care Plan : RN Care Manager Plan of Care  ?Updates made by [name], RN since 02/27/2022 12:00 AM  ?  ? ?Problem: Health Management needs related to DMII   ?  ? ?Long-Range Goal: Development of Plan of Care to address Health Management needs related to DMII   ?Start Date: 02/27/2022  ?Expected End Date: 05/28/2022   ?Priority: High  ?Note:   ?Current Barriers:  ?Care Coordination needs related to Housing barriers  ?Chronic Disease Management support and education needs related to DMII, Homelessness, and Stress  ?Mr. Danne Baxter recen

## 2022-02-27 NOTE — Patient Instructions (Signed)
Visit Information ? ?Mr. Rigg was given information about Medicaid Managed Care team care coordination services as a part of their [facility] Medicaid benefit. [name] verbally consented to engagement with the [facility] Managed Care team.  ? ?If you are experiencing a medical emergency, please call 911 or report to your local emergency department or urgent care.  ? ?If you have a non-emergency medical problem during routine business hours, please contact your provider's office and ask to speak with a nurse.  ? ?For questions related to your [facility] health plan, please call: [phone] or go here:[email or web address] ? ?If you would like to schedule transportation through your [facility] plan, please call the following number at least 2 days in advance of your appointment: [phone]. ? You can also use the MTM portal or MTM mobile app to manage your rides. For the portal, please go to [email or web address] ? ?Call the Behavioral Health Crisis Line at [phone], at any time, 24 hours a day, 7 days a week. If you are in danger or need immediate medical attention call 911. ? ?If you would like help to quit smoking, call 1-800-QUIT-NOW (561-066-3871) OR Espa?ol: 1-855-D?jelo-Ya 587-215-4257) o para m?s informaci?n haga clic aqu? or Text READY to 200-400 to register via text ? ?Mr. Willis, ? ? ?Please see education materials related to DM provided as print materials.  ? ?The patient verbalized understanding of instructions, educational materials, and care plan provided today and agreed to receive a mailed copy of patient instructions, educational materials, and care plan.  ? ?Telephone follow up appointment with Managed Medicaid care management team member scheduled for:03/06/22 @ 1:15pm ? ?Estanislado Emms RN, BSN ?[place]  [facility] ?RN Care Coordinator ? ? ?Following is a copy of your plan of care:  ?Care Plan : RN Care Manager Plan of Care  ?Updates made by Heidi Dach, RN since 02/27/2022 12:00 AM  ?  ? ?Problem: Health Management needs related to DMII   ?  ? ?Long-Range Goal: Development of Plan of Care to address Health Management needs related to DMII   ?Start Date: 02/27/2022  ?Expected End Date: 05/28/2022  ?Priority: High  ?Note:   ?Current Barriers:  ?Care Coordination needs related to Housing barriers  ?Chronic Disease Management support and education needs related to DMII, Homelessness, and Stress  ?Mr. Crotty recently became homeless. He was staying in Emergency housing and was asked to leave for not following the rules. He is working with Loraine Leriche from [employer] and spoke with him this morning. Mr. Rodeheaver has missed many appointments and needs reschedule. ? ?RNCM Clinical Goal(s):  ?Patient will verbalize understanding of plan for management of DMII as evidenced by patient report ?continue to work with Medical illustrator and/or Social Worker to address care management and care coordination needs related to Homelessness and managing stress as evidenced by adherence to CM Team Scheduled appointments     through collaboration with Medical illustrator, provider, and care team.  ? ?Interventions: ?Inter-disciplinary care team collaboration (see longitudinal plan of care) ?Evaluation of current treatment plan related to  self management and patient's adherence to plan as established by provider ?Updated patient's chart with temporary address for future mail distribution ?Collaborate with Eileen Stanford, LCSW for housing updates ?Advised patient to follow up with resources provided by [facility] with Disability Advocacy ?RNCM will follow closely ? ? ?Diabetes:  (Status: New goal.) Long Term Goal  ? ?Lab Results  ?Component Value Date  ? HGBA1C 6.0 07/18/2021  ? @ ?  Assessed patient's understanding of A1c goal: <7% ?Referral made to social work team for assistance with managing stress and procuring housing ;      ?Assessed social determinant of health barriers;        ?Provided patient  with PCP office number [phone] ? ?Patient Goals/Self-Care Activities: ?Attend all scheduled provider appointments ?Call provider office for new concerns or questions  ?Work with the Child psychotherapist to address care coordination needs and will continue to work with the clinical team to address health care and disease management related needs ?Call PCP office to schedule follow up for missed appointment ? ? ?  ?  ?

## 2022-02-28 ENCOUNTER — Telehealth: Payer: Self-pay

## 2022-02-28 NOTE — Telephone Encounter (Signed)
Attempted to contact patient # [phone] to discuss need for transportation to [facility] appointment tomorrow. Message left with call back requested to this CM.  ? ?Per Estanislado Emms, RN/THN, she contacted [facility] about transportation to the appt tomorrow and she said, [facility] will not override the denial. They are asking if his appointment can be changed. ? ? ?Message sent to provider inquiring if appointment can be changed to telephone or re-scheduled ?

## 2022-02-28 NOTE — Telephone Encounter (Signed)
Another call placed to patient to discuss transportation plan for tomorrow to [facility].  Message left with call back requested to this CM. ? ?Per Estanislado Emms, RN/THN, Rolene Arbour has approved the transportation for tomorrow.  Will need patient to call back to inform him of details.  ?

## 2022-02-28 NOTE — Telephone Encounter (Signed)
Copied from [place] [id number]. Topic: General - Other ?>> Feb 27, 2022  1:05 PM McGill, Nelva Bush wrote: ?Reason for CRM:Pt is requesting transportation for an upcoming hospital follow up appointment on 4/27. ? ? ?Pt stated he is currently homeless and living in his car and needs to be picked up at [address], [place], [place] [address] ? ? ?Patient was called and case manager has been informed and will be working on getting patient to his appointment tomorrow. ?

## 2022-02-28 NOTE — Telephone Encounter (Signed)
Case manager will assist with setting up transportation for patient. ?

## 2022-03-01 ENCOUNTER — Ambulatory Visit: Payer: Medicaid Other | Attending: Internal Medicine | Admitting: Internal Medicine

## 2022-03-01 DIAGNOSIS — Z91199 Patient's noncompliance with other medical treatment and regimen due to unspecified reason: Secondary | ICD-10-CM

## 2022-03-01 NOTE — Progress Notes (Signed)
Patient ID: Scott Greer, male   DOB: 1956-06-11, 66 y.o.   MRN: [id number] ?Patient no showed this telephone appointment.  Call was placed to patient at 12:14 p.m.  I left a voicemail message.  Called back again 3 times.  Case worker will work on rescheduling him. ?

## 2022-03-01 NOTE — Telephone Encounter (Signed)
I received a message last evening that [facility] would not be able to secure a transportation vendor to provide a ride for the patient today.  ? ?I called patient this morning regarding his appointment today.  He said he is waiting for his transportation and he said he confirmed  the ride. However he did not know who he spoke with  to confirm the ride. He said that he gave the person the address where he would be and they told him the ride would be there "early." I explained to him that I do not see any record of transportation being arranged. He insisted that he has a confirmed ride and was told that he would be picked up this morning.  ? ?I said I could order cab transportation to the appointment if needed but he said he needs wheelchair transport because he is not able to use his prostheses. Wheelchair transport cannot be ordered last minute.  ? ?I told him that Dr Laural Benes could do telephone visit with him today and we can schedule an in person appointment and transportation for him at a later date. He said not to bother.  I told him that it would be important to speak with Dr Laural Benes today and establish a plan for follow up. He said he could be " doing other things today" if he knew he couldn't see Dr Laural Benes. I told him that Dr Laural Benes would be notified that the visit will be by telephone.  ? ?I spoke to [name], LCSW who received a call from the patient about the appointment today and transportation.  I explained to her that Dr Laural Benes will do a telephone visit with him today and we can schedule his next visit for in person.  Eileen Stanford said she would let the patient know about the telephone visit this morning.  ? ?

## 2022-03-02 ENCOUNTER — Other Ambulatory Visit: Payer: Self-pay

## 2022-03-02 NOTE — Patient Outreach (Signed)
[facility] Customer service manager [facility]) Care Management ? ?03/02/2022 ? ?[name] ?Oct 04, 1956 ?[id number] ? ?LCSW completed [facility] outreach attempt today during scheduled appointment time but was unable to reach patient successfully. A HIPPA compliant voice message was left encouraging patient to return call once available. LCSW will ask Scheduling Care Guide to reschedule [facility] SW appointment with patient as well. ? ?Dickie La, BSW, MSW, LCSW ?Managed Medicaid LCSW ?[place]  [facility] ?[email or web address] ?Phone: [phone] ? ? ? ? ?

## 2022-03-02 NOTE — Patient Instructions (Signed)
Visit Information ? ?[name]  - as a part of your Medicaid benefit, you are eligible for care management and care coordination services at no cost or copay. I was unable to reach you by phone today but would be happy to help you with your health related needs. Please feel free to call me @ [phone]  ? ?A member of the Managed Medicaid care management team will reach out to you again over the next 7 days.  ? ?Mickel Fuchs, BSW, MHA ?[place]  ?High Risk Managed Medicaid Team  ?[phone]  ?

## 2022-03-02 NOTE — Patient Instructions (Signed)
Scott Greer ,  ? ?The Medicaid Managed Care Team is available to provide assistance to you with your healthcare needs at no cost and as a benefit of your Medicaid Health plan. I'm sorry I was unable to reach you today for our scheduled appointment. Our care guide will call you to reschedule our telephone appointment. Please call me at the number below. I am available to be of assistance to you regarding your healthcare needs. .  ? ?Thank you,  ? ?Kendallyn Lippold, BSW, MSW, LCSW ?Managed Medicaid LCSW ?[place]  [facility] ?[email or web address] ?Phone: [phone] ? ? ?

## 2022-03-02 NOTE — Patient Outreach (Signed)
Care Coordination ? ?03/02/2022 ? ?[name] ?1956/09/12 ?[id number] ? ? ?Medicaid Managed Care  ? ?Unsuccessful Outreach Note ? ?03/02/2022 ?Name: Scott Greer MRN: [id number] DOB: [birth year] ? ?Referred by: [name], MD ?Reason for referral : High Risk Managed Medicaid (MM social Work telephone outreach) ? ? ?An unsuccessful telephone outreach was attempted today. The patient was referred to the case management team for assistance with care management and care coordination.  ? ?Follow Up Plan: The care management team will reach out to the patient again over the next 7 days.  ? ?Scott Greer, BSW, MHA ?[facility]  ?High Risk Managed Medicaid Team  ?[phone]  ?

## 2022-03-04 ENCOUNTER — Other Ambulatory Visit: Payer: Self-pay | Admitting: Internal Medicine

## 2022-03-04 ENCOUNTER — Other Ambulatory Visit (HOSPITAL_COMMUNITY): Payer: Self-pay | Admitting: Cardiology

## 2022-03-04 DIAGNOSIS — E1121 Type 2 diabetes mellitus with diabetic nephropathy: Secondary | ICD-10-CM

## 2022-03-05 ENCOUNTER — Ambulatory Visit: Payer: Medicaid Other | Attending: Internal Medicine | Admitting: Internal Medicine

## 2022-03-05 ENCOUNTER — Other Ambulatory Visit: Payer: Self-pay | Admitting: Internal Medicine

## 2022-03-05 DIAGNOSIS — Z91148 Patient's other noncompliance with medication regimen for other reason: Secondary | ICD-10-CM | POA: Diagnosis not present

## 2022-03-05 DIAGNOSIS — Z89512 Acquired absence of left leg below knee: Secondary | ICD-10-CM

## 2022-03-05 DIAGNOSIS — Z89511 Acquired absence of right leg below knee: Secondary | ICD-10-CM | POA: Diagnosis not present

## 2022-03-05 DIAGNOSIS — E1159 Type 2 diabetes mellitus with other circulatory complications: Secondary | ICD-10-CM | POA: Diagnosis not present

## 2022-03-05 DIAGNOSIS — E1121 Type 2 diabetes mellitus with diabetic nephropathy: Secondary | ICD-10-CM

## 2022-03-05 DIAGNOSIS — Z5900 Homelessness unspecified: Secondary | ICD-10-CM

## 2022-03-05 DIAGNOSIS — Z419 Encounter for procedure for purposes other than remedying health state, unspecified: Secondary | ICD-10-CM | POA: Diagnosis not present

## 2022-03-05 NOTE — Progress Notes (Signed)
Patient ID: Scott Greer, male   DOB: 05-25-1956, 66 y.o.   MRN: [id number] ?Virtual Visit via Telephone Note ? ?I connected with Scott Greer on 03/05/2022 at 3:29 PM by telephone and verified that I am speaking with the correct person using two identifiers ? ?Location: ?Patient: in his car ?Provider: office ? ?Participants: ?Myself ?Patient ?  ?I discussed the limitations, risks, security and privacy concerns of performing an evaluation and management service by telephone and the availability of in person appointments. I also discussed with the patient that there may be a patient responsible charge related to this service. The patient expressed understanding and agreed to proceed. ? ? 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  Last eval by me 11/17/2021. ? ?Pt reports he is homeless.  Currently sleeping in his car.  Seen in the emergency room 02/20/2022 with pain and swelling in both of his stumps.  Patient tells me that he has not been wearing his prosthesis due to the swelling.  He thinks the swelling is caused by him walking around on his stumps when he has to get out of his car.  He has not noticed any open wounds or drainage.  Reports being seen by Scott Greer at [facility] and was told to take the cushion out of the prosthesis and put on a lot of socks.  He was referred for physical therapy by me on last visit.  He has been inconsistent in going due to transportation issues at that time.  Also reports that he has not been taking his medications consistently because they make him urinate too much and cause constipation.  When he has to urinate, he has to go right away which she states is an inconvenience for him given that he is living out of his vehicle.  Denies any chest pains or shortness of breath. ?He is not checking  blood sugars.  Diabetic testing supplies are in storage.  Reports he takes metformin occasionally.  Last A1c in September of last year was 6. ? ?Outpatient Encounter Medications as of 03/05/2022  ?Medication Sig  ? amiodarone (PACERONE) 200 MG tablet Take 1 tablet (200 mg total) by mouth daily. (Patient not taking: Reported on 02/27/2022)  ? aspirin (ASPIRIN LOW DOSE) 81 MG chewable tablet Chew 1 tablet (81 mg total) by mouth daily. (Patient not taking: Reported on 02/27/2022)  ? atorvastatin (LIPITOR) 10 MG tablet Take 1 tablet (10 mg total) by mouth daily with supper. (Patient not taking: Reported on 12/01/2021)  ? BIDIL 20-37.5 MG tablet TAKE ONE TABLET BY MOUTH THREE TIMES DAILY Needs appointment for further refills (Patient not taking: Reported on 02/27/2022)  ? Blood Glucose Monitoring Suppl (TRUE METRIX METER) w/Device KIT Use as directed (Patient not taking: Reported on 11/20/2021)  ? carvedilol (COREG) 3.125 MG tablet TAKE ONE TABLET BY MOUTH EVERY MORNING and TAKE ONE TABLET BY MOUTH EVERY EVENING WITH A MEAL (Patient not taking: Reported on 02/27/2022)  ? digoxin (LANOXIN) 0.125 MG tablet Take 1 tablet (0.125 mg total) by mouth daily with supper. (Patient not taking: Reported on 02/27/2022)  ? empagliflozin (JARDIANCE) 10 MG TABS tablet Take 1 tablet (10 mg total) by mouth daily. (Patient not taking: Reported on 02/27/2022)  ? FEROSUL 325 (65 Fe) MG tablet TAKE ONE TABLET BY MOUTH EVERY MORNING WITH BREAKFAST (Patient  not taking: Reported on 12/01/2021)  ? gabapentin (NEURONTIN) 300 MG capsule TAKE ONE CAPSULE BY MOUTH THREE TIMES DAILY (Patient not taking: Reported on 02/27/2022)  ? glucose blood (TRUE METRIX BLOOD GLUCOSE TEST) test strip Use as instructed (Patient not taking: Reported on 11/20/2021)  ? metFORMIN (GLUCOPHAGE) 500 MG tablet TAKE ONE TABLET BY MOUTH TWICE DAILY WITH A MEAL (Patient not taking: Reported on 02/27/2022)  ? nutrition supplement, JUVEN, (JUVEN) PACK Take 1 packet by mouth 2 (two) times  daily between meals. (Patient not taking: Reported on 11/20/2021)  ? oxyCODONE (ROXICODONE) 5 MG immediate release tablet Take 1 tablet (5 mg total) by mouth every 8 (eight) hours as needed for severe pain. (Patient not taking: Reported on 12/01/2021)  ? potassium chloride SA (KLOR-CON M) 20 MEQ tablet Take 1 tablet (20 mEq total) by mouth daily. LAST REFILL NEEDS TO KEEP FOLLOW UP APPOINTMENT FOR ANYMORE REFILLS  ? sacubitril-valsartan (ENTRESTO) 24-26 MG Take 1 tablet by mouth 2 (two) times daily. LAST REFILL NEEDS TO KEEP FOLLOW UP APPOINTMENT FOR ANYMORE REFILLS (Patient not taking: Reported on 02/27/2022)  ? spironolactone (ALDACTONE) 25 MG tablet TAKE ONE TABLET BY MOUTH EVERY MORNING Needs appointment for further refills (Patient not taking: Reported on 02/27/2022)  ? torsemide (DEMADEX) 20 MG tablet Take 1 tablet (20 mg total) by mouth daily. LAST REFILL NEEDS TO KEEP FOLLOW UP APPOINTMENT FOR ANYMORE REFILLS (Patient not taking: Reported on 02/27/2022)  ? TRUEplus Lancets 28G MISC Use as directed (Patient not taking: Reported on 11/20/2021)  ? Vitamin D, Ergocalciferol, (DRISDOL) 1.25 MG (50000 UNIT) CAPS capsule TAKE ONE CAPSULE BY MOUTH ONCE WEEKLY ON SUNDAY (Patient not taking: Reported on 12/01/2021)  ? zinc sulfate 220 (50 Zn) MG capsule Take 1 capsule (220 mg total) by mouth daily. (Patient not taking: Reported on 08/01/2021)  ? ?No facility-administered encounter medications on file as of 03/05/2022.  ? ? ?  ?Observations/Objective: ?Results for orders placed or performed during the hospital encounter of 02/20/22  ?Comprehensive metabolic panel  ?Result Value Ref Range  ? Sodium 138 135 - 145 mmol/L  ? Potassium 3.2 (L) 3.5 - 5.1 mmol/L  ? Chloride 106 98 - 111 mmol/L  ? CO2 27 22 - 32 mmol/L  ? Glucose, Bld 199 (H) 70 - 99 mg/dL  ? BUN 14 8 - 23 mg/dL  ? Creatinine, Ser 1.30 (H) 0.61 - 1.24 mg/dL  ? Calcium 8.6 (L) 8.9 - 10.3 mg/dL  ? Total Protein 5.4 (L) 6.5 - 8.1 g/dL  ? Albumin 2.8 (L) 3.5 - 5.0 g/dL  ?  AST 15 15 - 41 U/L  ? ALT 10 0 - 44 U/L  ? Alkaline Phosphatase 40 38 - 126 U/L  ? Total Bilirubin 0.5 0.3 - 1.2 mg/dL  ? GFR, Estimated >60 >60 mL/min  ? Anion gap 5 5 - 15  ?CBC  ?Result Value Ref Range  ? WBC 4.7 4.0 - 10.5 K/uL  ? RBC 3.82 (L) 4.22 - 5.81 MIL/uL  ? Hemoglobin 11.1 (L) 13.0 - 17.0 g/dL  ? HCT 34.1 (L) 39.0 - 52.0 %  ? MCV 89.3 80.0 - 100.0 fL  ? MCH 29.1 26.0 - 34.0 pg  ? MCHC 32.6 30.0 - 36.0 g/dL  ? RDW 14.3 11.5 - 15.5 %  ? Platelets 220 150 - 400 K/uL  ? nRBC 0.0 0.0 - 0.2 %  ? ?Lab Results  ?Component Value Date  ? HGBA1C 6.0 07/18/2021  ? ? ? ?Assessment and Plan: ?1.  Homeless ?Main issue right now is homelessness. ?I do not think his other issues will get better until he has secure housing. ?The social worker/care coordinator with [facility] has been working with him but there has been issues and trying to reach him and consistently. ? ?2. S/P BKA (below knee amputation) bilateral ([place]) ?We will try to get him back with physical therapy.  However I stressed the importance of him taking his medications as the fluid pills will help in getting swelling down in stumps so that he can wear the prosthesis.  He understands that PT will only be able to work with him on his gait if he is able to wear the prosthesis ? ?3. H/O medication noncompliance ?This has been an ongoing issue but compounded by his homelessness.  Encouraged him to take his medications. ? ?4. Type 2 diabetes mellitus with other circulatory complications ([place]) ?Request that he comes to the lab to have A1c done. ? ? ?Follow Up Instructions: ?6-8 wks ?  ?I discussed the assessment and treatment plan with the patient. The patient was provided an opportunity to ask questions and all were answered. The patient agreed with the plan and demonstrated an understanding of the instructions. ?  ?The patient was advised to call back or seek an in-person evaluation if the symptoms worsen or if the condition fails to improve as  anticipated. ? ?I  Spent 13 minutes on this telephone encounter ? ?This note has been created with Surveyor, quantity. Any transcriptional errors are unintentional. ?

## 2022-03-05 NOTE — Telephone Encounter (Signed)
Pt had was suppose to have a virtual appt with pcp for today 03/05/22. Will forward to provider  ?

## 2022-03-06 ENCOUNTER — Other Ambulatory Visit: Payer: Self-pay | Admitting: *Deleted

## 2022-03-06 ENCOUNTER — Telehealth: Payer: Self-pay

## 2022-03-06 NOTE — Patient Outreach (Addendum)
?Medicaid Managed Care   ?Nurse Care Manager Note ? ?03/06/2022 ?Name:  Scott Greer MRN:  [id number] DOB:  1956-09-07 ? ?Scott Greer is an 66 y.o. year old male who is a primary patient of Scott Matar, MD.  The [facility] Managed Care Coordination team was consulted for assistance with:    ?DMII ? ?Scott Greer was given information about Medicaid Managed Care Coordination team services today. [name] Patient agreed to services and verbal consent obtained. ? ?Engaged with patient by telephone for follow up visit in response to provider referral for case management and/or care coordination services.  ? ?Assessments/Interventions:  Review of past medical history, allergies, medications, health status, including review of consultants reports, laboratory and other test data, was performed as part of comprehensive evaluation and provision of chronic care management services. ? ?SDOH (Social Determinants of Health) assessments and interventions performed: ?SDOH Interventions   ? ?Flowsheet Row Most Recent Value  ?SDOH Interventions   ?Transportation Interventions Other (Comment)  [Provided with [employer] [phone]  ? ?  ? ? ?Care Plan ? ?Allergies  ?Allergen Reactions  ? Bee Venom Hives, Itching and Swelling  ? ? ?Medications Reviewed Today   ? ? RNCM unable to review medications today ? ?  ?  ? ?  ? ? ?Patient Active Problem List  ? Diagnosis Date Noted  ? Chronic combined systolic and diastolic CHF (congestive heart failure) (HCC) 12/01/2021  ? S/P BKA (below knee amputation) bilateral (HCC) 07/18/2021  ? Acute hematogenous osteomyelitis of right foot (HCC) 04/26/2021  ? Gangrene of right foot (HCC)   ? Foot pain, right 04/14/2021  ? Subacute osteomyelitis, right ankle and foot (HCC)   ? Status post below-knee amputation (HCC) 02/17/2021  ? Wound dehiscence   ? Gangrene of toe of left foot (HCC)   ? Cutaneous abscess of left foot   ? Left foot infection 02/07/2021  ? Leukocytosis  02/07/2021  ? Thrombocytosis 02/07/2021  ? Hyponatremia 02/07/2021  ? AKI (acute kidney injury) (HCC) 02/07/2021  ? Hyperglycemia due to diabetes mellitus (HCC) 02/07/2021  ? Hypoalbuminemia due to protein-calorie malnutrition (HCC) 02/07/2021  ? Osteomyelitis of second toe of left foot (HCC)   ? New onset of congestive heart failure (HCC) 12/26/2020  ? CKD (chronic kidney disease) stage 3, GFR 30-59 ml/min (HCC) 12/26/2020  ? Acute systolic CHF (congestive heart failure) (HCC) 12/26/2020  ? Anemia, chronic disease 06/22/2020  ? Amputee, great toe, right (HCC) 06/20/2020  ? Normocytic anemia 06/20/2020  ? Erectile dysfunction associated with type 2 diabetes mellitus (HCC) 06/20/2020  ? Positive for macroalbuminuria 10/24/2019  ? Amputation of left great toe (HCC) 10/23/2019  ? Tobacco dependence 10/23/2019  ? Osteomyelitis of great toe of right foot (HCC) 06/04/2019  ? Diabetic foot infection (HCC)   ? Noncompliance   ? Glaucoma   ? Hyperlipidemia   ? Essential hypertension 11/06/2003  ? Diabetes mellitus (HCC) 11/05/1997  ? ? ?Conditions to be addressed/monitored per PCP order:  DMII ? ?Care Plan : RN Care Manager Plan of Care  ?Updates made by Heidi Dach, RN since 03/06/2022 12:00 AM  ?  ? ?Problem: Health Management needs related to DMII   ?  ? ?Long-Range Goal: Development of Plan of Care to address Health Management needs related to DMII   ?Start Date: 02/27/2022  ?Expected End Date: 05/28/2022  ?Priority: High  ?Note:   ?Current Barriers:  ?Care Coordination needs related to Housing barriers  ?Chronic Disease Management support  and education needs related to DMII, Homelessness, and Stress  ?[name] had a telephone visit with Dr. Wynetta Emery on 03/05/22(RNCM unable to see note). He continues to look for housing. Missed appointment with BSW and LCSW due to his phone being dead. Patient had to end conversation with RNCM to move his car.  ? ?RNCM Clinical Goal(s):  ?Patient will verbalize understanding of plan for  management of DMII as evidenced by patient report ?continue to work with Consulting civil engineer and/or Social Worker to address care management and care coordination needs related to Homelessness and managing stress as evidenced by adherence to CM Team Scheduled appointments     through collaboration with Consulting civil engineer, provider, and care team.  ? ?Interventions: ?Inter-disciplinary care team collaboration (see longitudinal plan of care) ?Evaluation of current treatment plan related to  self management and patient's adherence to plan as established by provider ?Collaborate with BSW for housing updates ?Advised patient to follow up with resources provided by [facility] with Disability Advocacy ?Collaborate with Eliezer Lofts, LCSW with [facility] to reestablish care. ?RNCM will follow up in one week ? ? ?Diabetes:  (Status: Goal on Track (progressing): YES.) Long Term Goal  ? ?Lab Results  ?Component Value Date  ? HGBA1C 6.0 07/18/2021  ? @ ?Assessed patient's understanding of A1c goal: <7% ?Discussed plans with patient for ongoing care management follow up and provided patient with direct contact information for care management team;      ?Referral made to social work team for assistance with managing stress and procuring housing ;      ?Assessed social determinant of health barriers;        ? ?Patient Goals/Self-Care Activities: ?Attend all scheduled provider appointments ?Call provider office for new concerns or questions  ?Work with the Education officer, museum to address care coordination needs and will continue to work with the clinical team to address health care and disease management related needs ?Follow up with housing resources ? ? ?  ? ? ?Follow Up:  Patient agrees to Care Plan and Follow-up. ? ?Plan: The Managed Medicaid care management team will reach out to the patient again over the next 7 days. ? ?Date/time of next scheduled RN care management/care coordination outreach:  03/14/22 @ 1:15pm ? ?[name] RN, BSN ?[place] ?RN Care Coordinator ? ?

## 2022-03-06 NOTE — Patient Outreach (Signed)
Care Coordination ? ?03/06/2022 ? ?[name] ?09-Jan-1956 ?[id number] ? ?BSW completed outreach due to patient asking RNCM for assisting and asking BSW to call today. BSW will reach back out on patients appointment date 03/13/22. ? ? ?Gus Puma, BSW, [place] ?[employer] Health  ?High Risk Managed Medicaid Team  ?[phone]  ?

## 2022-03-06 NOTE — Patient Instructions (Addendum)
Visit Information ? ?[name] was given information about Medicaid Managed Care team care coordination services as a part of their [facility] Medicaid benefit. Scott Greer verbally consented to engagement with the [facility] Managed Care team.  ? ?If you are experiencing a medical emergency, please call 911 or report to your local emergency department or urgent care.  ? ?If you have a non-emergency medical problem during routine business hours, please contact your provider's office and ask to speak with a nurse.  ? ?For questions related to your [facility] health plan, please call: [phone] or go here:[email or web address] ? ?If you would like to schedule transportation through your [facility] plan, please call the following number at least 2 days in advance of your appointment: [phone]. ? You can also use the MTM portal or MTM mobile app to manage your rides. For the portal, please go to [email or web address]. ? ?Call the [place] at [phone], at any time, 24 hours a day, 7 days a week. If you are in danger or need immediate medical attention call 911. ? ?If you would like help to quit smoking, call 1-800-QUIT-NOW 303 564 8371) OR Espa?ol: 1-855-D?jelo-Ya 315-581-7945) o para m?s informaci?n haga clic aqu? or Text READY to 200-400 to register via text ? ?Scott Greer, ? ? ?Please see education materials related to DM provided as print materials.  ? ?The patient verbalized understanding of instructions, educational materials, and care plan provided today and agreed to receive a mailed copy of patient instructions, educational materials, and care plan.  ? ?Telephone follow up appointment with Managed Medicaid care management team member scheduled for:03/14/22 @ 1:15pm ? ?[name] RN, BSN ?[place] ?RN Care Coordinator ? ? ?Following is a copy of your plan of care:  ?Care Plan : [place] of Care  ?Updates made by [name], RN since 03/06/2022 12:00 AM  ?  ? ?Problem: Health Management needs related to DMII   ?  ? ?Long-Range Goal: Development of Plan of Care to address Health Management needs related to DMII   ?Start Date: 02/27/2022  ?Expected End Date: 05/28/2022  ?Priority: High  ?Note:   ?Current Barriers:  ?Care Coordination needs related to Housing barriers  ?Chronic Disease Management support and education needs related to DMII, Homelessness, and Stress  ?Scott Greer had a telephone visit with Dr. Wynetta Emery on 03/05/22(RNCM unable to see note). He continues to look for housing. Missed appointment with BSW and LCSW due to his phone being dead. Patient had to end conversation with RNCM to move his car.  ? ?RNCM Clinical Goal(s):  ?Patient will verbalize understanding of plan for management of DMII as evidenced by patient report ?continue to work with Consulting civil engineer and/or Social Worker to address care management and care coordination needs related to Homelessness and managing stress as evidenced by adherence to CM Team Scheduled appointments     through collaboration with Consulting civil engineer, provider, and care team.  ? ?Interventions: ?Inter-disciplinary care team collaboration (see longitudinal plan of care) ?Evaluation of current treatment plan related to  self management and patient's adherence to plan as established by provider ?Collaborate with BSW for housing updates ?Advised patient to follow up with resources provided by [facility] with Disability Advocacy ?Collaborate with Eliezer Lofts, LCSW with [facility] to reestablish care. ?RNCM will follow up in one week ? ? ?Diabetes:  (Status: Goal on Track (progressing): YES.) Long Term Goal  ? ?Lab Results  ?Component Value Date  ?  HGBA1C 6.0 07/18/2021  ? @ ?Assessed patient's understanding of A1c goal: <7% ?Discussed plans with patient for ongoing care management follow up and provided patient with direct contact information for care management team;      ?Referral made to social work team  for assistance with managing stress and procuring housing ;      ?Assessed social determinant of health barriers;        ? ?Patient Goals/Self-Care Activities: ?Attend all scheduled provider appointments ?Call provider office for new concerns or questions  ?Work with the Education officer, museum to address care coordination needs and will continue to work with the clinical team to address health care and disease management related needs ?Follow up with housing resources ? ? ?  ?  ?

## 2022-03-06 NOTE — Patient Instructions (Signed)
Visit Information ? ?[name]  - as a part of your Medicaid benefit, you are eligible for care management and care coordination services at no cost or copay. I was unable to reach you by phone today but would be happy to help you with your health related needs. Please feel free to call me @ [phone] ? ?A member of the Managed Medicaid care management team will reach out to you again over the next 30 ? ?Scott Greer, BSW, [place] ?[employer]  ?High Risk Managed Medicaid Team  ?[phone]  ?

## 2022-03-08 ENCOUNTER — Telehealth: Payer: Self-pay | Admitting: *Deleted

## 2022-03-08 NOTE — Patient Outreach (Signed)
Care Coordination ? ?03/08/2022 ? ?[name] ?[birth year] ?[id number] ? ?RNCM received Teams message from PCP office requesting this RNCM to arrange transportation to the office for needed labs to be collected. RNCM attempted to reach Mr. Kava to discuss date and time for lab collection and arranging transportation. RNCM left a detailed HIPAA compliant message, including contact information, requesting a return call. RNCM will try to reach patient again over the next 2-3 days. ? ?[name] RN, BSN ?[place] ?RN Care Coordinator ? ?

## 2022-03-09 ENCOUNTER — Other Ambulatory Visit: Payer: Self-pay | Admitting: *Deleted

## 2022-03-09 NOTE — Patient Outreach (Signed)
Medicaid Managed Care   Nurse Care Manager Note  03/09/2022 Name:  Scott Greer MRN:  [id number] DOB:  03-01-1956  Scott Greer is an 66 y.o. year old male who is a primary patient of [name], MD.  The [facility] Managed Care Coordination team was consulted for assistance with:    DMII  Mr. Fera was given information about Medicaid Managed Care Coordination team services today. Scott Greer Patient agreed to services and verbal consent obtained.  Engaged with patient by telephone for follow up visit in response to provider referral for case management and/or care coordination services.   Assessments/Interventions:  Review of past medical history, allergies, medications, health status, including review of consultants reports, laboratory and other test data, was performed as part of comprehensive evaluation and provision of chronic care management services.  SDOH (Social Determinants of Health) assessments and interventions performed:   Care Plan  Allergies  Allergen Reactions   Bee Venom Hives, Itching and Swelling    Medications Reviewed Today     Reviewed by Heidi Dach, RN (Registered Nurse) on 03/09/22 at 1011  Med List Status: <None>   Medication Order Taking? Sig Documenting Provider Last Dose Status Informant  amiodarone (PACERONE) 200 MG tablet [id number] No Take 1 tablet (200 mg total) by mouth daily.  Patient not taking: Reported on 02/27/2022   Laurey Morale, MD Not Taking Active   aspirin (ASPIRIN LOW DOSE) 81 MG chewable tablet [id number] No Chew 1 tablet (81 mg total) by mouth daily.  Patient not taking: Reported on 02/27/2022   Laurey Morale, MD Not Taking Active   atorvastatin (LIPITOR) 10 MG tablet [id number] No Take 1 tablet (10 mg total) by mouth daily with supper.  Patient not taking: Reported on 12/01/2021   Laurey Morale, MD Not Taking Active   BIDIL 20-37.5 MG tablet [id number] No TAKE ONE TABLET BY MOUTH THREE TIMES DAILY Needs  appointment for further refills  Patient not taking: Reported on 02/27/2022   Laurey Morale, MD Not Taking Active   Blood Glucose Monitoring Suppl (TRUE METRIX METER) w/Device KIT [id number] No Use as directed  Patient not taking: Reported on 11/20/2021   [name], MD Not Taking Active Other  carvedilol (COREG) 3.125 MG tablet [id number] No TAKE ONE TABLET BY MOUTH EVERY MORNING and TAKE ONE TABLET BY MOUTH EVERY EVENING WITH A MEAL  Patient not taking: Reported on 02/27/2022   Laurey Morale, MD Not Taking Active   digoxin (LANOXIN) 0.125 MG tablet [id number] No Take 1 tablet (0.125 mg total) by mouth daily with supper.  Patient not taking: Reported on 02/27/2022   Laurey Morale, MD Not Taking Active   empagliflozin (JARDIANCE) 10 MG TABS tablet [id number] No Take 1 tablet (10 mg total) by mouth daily.  Patient not taking: Reported on 02/27/2022   Laurey Morale, MD Not Taking Active   FEROSUL [phone] Fe) MG tablet [id number] No TAKE ONE TABLET BY MOUTH EVERY MORNING WITH BREAKFAST  Patient not taking: Reported on 12/01/2021   [name], MD Not Taking Active   gabapentin (NEURONTIN) 300 MG capsule [id number] No TAKE ONE CAPSULE BY MOUTH THREE TIMES DAILY  Patient not taking: Reported on 02/27/2022   [name], MD Not Taking Active   glucose blood (TRUE METRIX BLOOD GLUCOSE TEST) test strip [id number] No Use as instructed  Patient not taking: Reported on 11/20/2021   [name], MD Not Taking Active Other  metFORMIN (GLUCOPHAGE) 500 MG tablet [id number]  TAKE ONE TABLET BY MOUTH TWICE DAILY WITH A MEAL [name], MD  Active   nutrition supplement, Alphia Moh) PACK [id number] No Take 1 packet by mouth 2 (two) times daily between meals.  Patient not taking: Reported on 11/20/2021   Scott Greer Not Taking Active   oxyCODONE (ROXICODONE) 5 MG immediate release tablet [id number] No Take 1 tablet (5 mg total) by mouth every 8 (eight) hours as  needed for severe pain.  Patient not taking: Reported on 12/01/2021   Adonis Huguenin, NP Not Taking Active   potassium chloride SA (KLOR-CON M) 20 MEQ tablet [id number]  Take 1 tablet (20 mEq total) by mouth daily. Absolute last refill without office visit please call [phone] to schedule [name], Bevelyn Buckles, MD  Active   sacubitril-valsartan (ENTRESTO) 24-26 MG [id number] No Take 1 tablet by mouth 2 (two) times daily. LAST REFILL NEEDS TO KEEP FOLLOW UP APPOINTMENT FOR ANYMORE REFILLS  Patient not taking: Reported on 02/27/2022   Laurey Morale, MD Not Taking Active   spironolactone (ALDACTONE) 25 MG tablet [id number]  Take 1 tablet (25 mg total) by mouth every morning. Absolute last refill without office visit please call [phone] to schedule [name], Bevelyn Buckles, MD  Active   torsemide (DEMADEX) 20 MG tablet [id number]  Take 1 tablet (20 mg total) by mouth daily. Absolute last refill without office visit please call [phone] to schedule [name], Bevelyn Buckles, MD  Active   TRUEplus Lancets 28G MISC [id number] No Use as directed  Patient not taking: Reported on 11/20/2021   [name], MD Not Taking Active Other  Vitamin D, Ergocalciferol, (DRISDOL) 1.25 MG (50000 UNIT) CAPS capsule [id number] No TAKE ONE CAPSULE BY MOUTH ONCE WEEKLY ON SUNDAY  Patient not taking: Reported on 12/01/2021   [name], MD Not Taking Active   zinc sulfate 220 (50 Zn) MG capsule [id number] No Take 1 capsule (220 mg total) by mouth daily.  Patient not taking: Reported on 08/01/2021   Scott Greer Not Taking Active             Patient Active Problem List   Diagnosis Date Noted   Chronic combined systolic and diastolic CHF (congestive heart failure) (HCC) 12/01/2021   S/P BKA (below knee amputation) bilateral (HCC) 07/18/2021   Acute hematogenous osteomyelitis of right foot (HCC) 04/26/2021   Gangrene of right foot (HCC)    Foot pain, right 04/14/2021   Subacute osteomyelitis, right  ankle and foot (HCC)    Status post below-knee amputation (HCC) 02/17/2021   Wound dehiscence    Gangrene of toe of left foot (HCC)    Cutaneous abscess of left foot    Left foot infection 02/07/2021   Leukocytosis 02/07/2021   Thrombocytosis 02/07/2021   Hyponatremia 02/07/2021   AKI (acute kidney injury) (HCC) 02/07/2021   Hyperglycemia due to diabetes mellitus (HCC) 02/07/2021   Hypoalbuminemia due to protein-calorie malnutrition (HCC) 02/07/2021   Osteomyelitis of second toe of left foot (HCC)    New onset of congestive heart failure (HCC) 12/26/2020   CKD (chronic kidney disease) stage 3, GFR 30-59 ml/min (HCC) 12/26/2020   Acute systolic CHF (congestive heart failure) (HCC) 12/26/2020   Anemia, chronic disease 06/22/2020   Amputee, great toe, right (HCC) 06/20/2020   Normocytic anemia 06/20/2020   Erectile dysfunction associated with type 2 diabetes mellitus (HCC) 06/20/2020   Positive for macroalbuminuria 10/24/2019   Amputation of left great  toe (HCC) 10/23/2019   Tobacco dependence 10/23/2019   Osteomyelitis of great toe of right foot (HCC) 06/04/2019   Diabetic foot infection (HCC)    Noncompliance    Glaucoma    Hyperlipidemia    Essential hypertension 11/06/2003   Diabetes mellitus (HCC) 11/05/1997    Conditions to be addressed/monitored per PCP order:  DMII  Care Plan : RN Care Manager Plan of Care  Updates made by Heidi Dach, RN since 03/09/2022 12:00 AM     Problem: Health Management needs related to DMII      Long-Range Goal: Development of Plan of Care to address Health Management needs related to DMII   Start Date: 02/27/2022  Expected End Date: 05/28/2022  Priority: High  Note:   Current Barriers:  Care Coordination needs related to Housing barriers  Chronic Disease Management support and education needs related to DMII, Homelessness, and Stress  Mr. Orders completed phone visit with PCP earlier this week. RNCM was contacted to arrange  transportation for patient to the office for collection of labs.  RNCM Clinical Goal(s):  Patient will verbalize understanding of plan for management of DMII as evidenced by patient report continue to work with RN Care Manager and/or Social Worker to address care management and care coordination needs related to Homelessness and managing stress as evidenced by adherence to CM Team Scheduled appointments     through collaboration with RN Care manager, provider, and care team.   Interventions: Inter-disciplinary care team collaboration (see longitudinal plan of care) Evaluation of current treatment plan related to  self management and patient's adherence to plan as established by provider Collaborate with BSW for housing updates Collaborate with Eileen Stanford, LCSW with [facility] Clinic to reestablish care. RNCM will follow up in one week RNCM assisted with arranging transportation to [facility] for lab collection, on 03/14/22, pick up time 9:30am from [address] [place], [place], [place] [address], going to [address] [place], [place], [place] [address], return pick up time 11:00am, conf # [id number]   Diabetes:  (Status: Goal on Track (progressing): YES.) Long Term Goal   Lab Results  Component Value Date   HGBA1C 6.0 07/18/2021   @ Assessed patient's understanding of A1c goal: <7% Discussed plans with patient for ongoing care management follow up and provided patient with direct contact information for care management team;      Referral made to social work team for assistance with managing stress and procuring housing ;      Assessed social determinant of health barriers;         Patient Goals/Self-Care Activities: Attend all scheduled provider appointments Call provider office for new concerns or questions  Work with the social worker to address care coordination needs and will continue to work with the clinical team to address health care and disease management related needs Follow up with  housing resources       Follow Up:  Patient agrees to Care Plan and Follow-up.  Plan: The Managed Medicaid care management team will reach out to the patient again over the next 7 days.  Date/time of next scheduled RN care management/care coordination outreach:  03/14/22 @ 1:15pm  Estanislado Emms RN, BSN [place]  [employer]

## 2022-03-09 NOTE — Patient Instructions (Signed)
Visit Information ? ?Mr. Lumpp was given information about Medicaid Managed Care team care coordination services as a part of their [facility] Medicaid benefit. [name] verbally consented to engagement with the [facility] Managed Care team.  ? ?If you are experiencing a medical emergency, please call 911 or report to your local emergency department or urgent care.  ? ?If you have a non-emergency medical problem during routine business hours, please contact your provider's office and ask to speak with a nurse.  ? ?For questions related to your [facility] health plan, please call: [phone] or go here:[email or web address] ? ?If you would like to schedule transportation through your [facility] plan, please call the following number at least 2 days in advance of your appointment: [phone]. ? You can also use the MTM portal or MTM mobile app to manage your rides. For the portal, please go to [email or web address]. ? ?Call the [place] at [phone], at any time, 24 hours a day, 7 days a week. If you are in danger or need immediate medical attention call 911. ? ?If you would like help to quit smoking, call 1-800-QUIT-NOW 7193632848) OR Espa?ol: 1-855-D?jelo-Ya 702-042-2434) o para m?s informaci?n haga clic aqu? or Text READY to 200-400 to register via text ? ?Mr. Toothman, ? ? ?Please see education materials related to DM provided as print materials.  ? ?The patient verbalized understanding of instructions, educational materials, and care plan provided today and declined offer to receive copy of patient instructions, educational materials, and care plan.  ? ?Telephone follow up appointment with Managed Medicaid care management team member scheduled for: ? ?[name] RN, BSN ?[place] ?RN Care Coordinator ? ? ?Following is a copy of your plan of care:  ?Care Plan : [place] of Care  ?Updates made by [name], RN  since 03/09/2022 12:00 AM  ?  ? ?Problem: Health Management needs related to DMII   ?  ? ?Long-Range Goal: Development of Plan of Care to address Health Management needs related to DMII   ?Start Date: 02/27/2022  ?Expected End Date: 05/28/2022  ?Priority: High  ?Note:   ?Current Barriers:  ?Care Coordination needs related to Housing barriers  ?Chronic Disease Management support and education needs related to DMII, Homelessness, and Stress  ?Mr. Danne Baxter completed phone visit with PCP earlier this week. RNCM was contacted to arrange transportation for patient to the office for collection of labs. ? ?RNCM Clinical Goal(s):  ?Patient will verbalize understanding of plan for management of DMII as evidenced by patient report ?continue to work with Consulting civil engineer and/or Social Worker to address care management and care coordination needs related to Homelessness and managing stress as evidenced by adherence to CM Team Scheduled appointments     through collaboration with Consulting civil engineer, provider, and care team.  ? ?Interventions: ?Inter-disciplinary care team collaboration (see longitudinal plan of care) ?Evaluation of current treatment plan related to  self management and patient's adherence to plan as established by provider ?Collaborate with BSW for housing updates ?Collaborate with Eliezer Lofts, LCSW with [facility] to reestablish care. ?RNCM will follow up in one week ?RNCM assisted with arranging transportation to [facility] for lab collection, on 03/14/22, pick up time 9:30am from [place], [place], [place] [address], going to [address], [place], [place] [address], return pick up time 11:00am, conf # [id number] ? ? ?Diabetes:  (Status: Goal on Track (progressing): YES.) Long Term Goal  ? ?  Lab Results  ?Component Value Date  ? HGBA1C 6.0 07/18/2021  ? @ ?Assessed patient's understanding of A1c goal: <7% ?Discussed plans with patient for ongoing care management follow up and provided patient with direct  contact information for care management team;      ?Referral made to social work team for assistance with managing stress and procuring housing ;      ?Assessed social determinant of health barriers;        ? ?Patient Goals/Self-Care Activities: ?Attend all scheduled provider appointments ?Call provider office for new concerns or questions  ?Work with the Education officer, museum to address care coordination needs and will continue to work with the clinical team to address health care and disease management related needs ?Follow up with housing resources ? ? ?  ?  ?

## 2022-03-12 ENCOUNTER — Other Ambulatory Visit: Payer: Self-pay

## 2022-03-12 ENCOUNTER — Emergency Department (HOSPITAL_COMMUNITY)
Admission: EM | Admit: 2022-03-12 | Discharge: 2022-03-13 | Disposition: A | Discharge status: Home / Self Care | Payer: Medicaid Other | Attending: Emergency Medicine | Admitting: Emergency Medicine

## 2022-03-12 DIAGNOSIS — M79604 Pain in right leg: Secondary | ICD-10-CM | POA: Insufficient documentation

## 2022-03-12 DIAGNOSIS — R52 Pain, unspecified: Secondary | ICD-10-CM | POA: Diagnosis not present

## 2022-03-12 DIAGNOSIS — Z79899 Other long term (current) drug therapy: Secondary | ICD-10-CM | POA: Insufficient documentation

## 2022-03-12 DIAGNOSIS — Z7982 Long term (current) use of aspirin: Secondary | ICD-10-CM | POA: Insufficient documentation

## 2022-03-12 DIAGNOSIS — R609 Edema, unspecified: Secondary | ICD-10-CM | POA: Diagnosis not present

## 2022-03-12 DIAGNOSIS — R0781 Pleurodynia: Secondary | ICD-10-CM | POA: Insufficient documentation

## 2022-03-12 DIAGNOSIS — Y92410 Unspecified street and highway as the place of occurrence of the external cause: Secondary | ICD-10-CM | POA: Diagnosis not present

## 2022-03-12 DIAGNOSIS — Z743 Need for continuous supervision: Secondary | ICD-10-CM | POA: Diagnosis not present

## 2022-03-12 DIAGNOSIS — R600 Localized edema: Secondary | ICD-10-CM | POA: Diagnosis not present

## 2022-03-12 DIAGNOSIS — Z041 Encounter for examination and observation following transport accident: Secondary | ICD-10-CM | POA: Diagnosis not present

## 2022-03-12 DIAGNOSIS — M7989 Other specified soft tissue disorders: Secondary | ICD-10-CM | POA: Diagnosis not present

## 2022-03-12 DIAGNOSIS — M546 Pain in thoracic spine: Secondary | ICD-10-CM | POA: Insufficient documentation

## 2022-03-12 DIAGNOSIS — M25511 Pain in right shoulder: Secondary | ICD-10-CM | POA: Insufficient documentation

## 2022-03-12 DIAGNOSIS — M19011 Primary osteoarthritis, right shoulder: Secondary | ICD-10-CM | POA: Diagnosis not present

## 2022-03-12 DIAGNOSIS — I10 Essential (primary) hypertension: Secondary | ICD-10-CM | POA: Diagnosis not present

## 2022-03-12 DIAGNOSIS — Z5900 Homelessness unspecified: Secondary | ICD-10-CM | POA: Diagnosis not present

## 2022-03-12 DIAGNOSIS — M79605 Pain in left leg: Secondary | ICD-10-CM | POA: Insufficient documentation
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
## 2022-03-13 ENCOUNTER — Emergency Department (HOSPITAL_COMMUNITY)
Admission: EM | Admit: 2022-03-13 | Discharge: 2022-03-14 | Disposition: A | Discharge status: Home / Self Care | Payer: Medicaid Other | Attending: Emergency Medicine | Admitting: Emergency Medicine

## 2022-03-13 ENCOUNTER — Emergency Department (HOSPITAL_COMMUNITY): Payer: Medicaid Other

## 2022-03-13 ENCOUNTER — Encounter (HOSPITAL_COMMUNITY): Payer: Self-pay

## 2022-03-13 ENCOUNTER — Ambulatory Visit: Payer: Self-pay

## 2022-03-13 ENCOUNTER — Telehealth: Payer: Self-pay | Admitting: *Deleted

## 2022-03-13 DIAGNOSIS — E119 Type 2 diabetes mellitus without complications: Secondary | ICD-10-CM | POA: Insufficient documentation

## 2022-03-13 DIAGNOSIS — Z7982 Long term (current) use of aspirin: Secondary | ICD-10-CM | POA: Diagnosis not present

## 2022-03-13 DIAGNOSIS — M25511 Pain in right shoulder: Secondary | ICD-10-CM | POA: Diagnosis not present

## 2022-03-13 DIAGNOSIS — M19011 Primary osteoarthritis, right shoulder: Secondary | ICD-10-CM | POA: Diagnosis not present

## 2022-03-13 DIAGNOSIS — R600 Localized edema: Secondary | ICD-10-CM | POA: Diagnosis not present

## 2022-03-13 DIAGNOSIS — Z79899 Other long term (current) drug therapy: Secondary | ICD-10-CM | POA: Diagnosis not present

## 2022-03-13 DIAGNOSIS — Z7984 Long term (current) use of oral hypoglycemic drugs: Secondary | ICD-10-CM | POA: Insufficient documentation

## 2022-03-13 DIAGNOSIS — I10 Essential (primary) hypertension: Secondary | ICD-10-CM | POA: Diagnosis not present

## 2022-03-13 DIAGNOSIS — M7989 Other specified soft tissue disorders: Secondary | ICD-10-CM | POA: Insufficient documentation

## 2022-03-13 DIAGNOSIS — R0781 Pleurodynia: Secondary | ICD-10-CM | POA: Diagnosis not present

## 2022-03-13 DIAGNOSIS — Z041 Encounter for examination and observation following transport accident: Secondary | ICD-10-CM | POA: Diagnosis not present

## 2022-03-13 NOTE — Discharge Instructions (Addendum)
It was a pleasure caring for you today in the emergency department. ° °Please return to the emergency department for any worsening or worrisome symptoms. ° ° °
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
## 2022-03-13 NOTE — Discharge Planning (Signed)
RNCM consulted regarding homeless pt requiring replacement wheelchair.  Pt unable to afford.  RNCM requested letter of guarantee (payment) for wheelchair from [facility] leadership.  TOC leadership approved and wheelchair provided by [employer] ?
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
## 2022-03-13 NOTE — ED Notes (Signed)
DC instructions reviewed with pt.  Pt verbalized understading and was rolled to lobby in new wheelchair.  ?
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
## 2022-03-13 NOTE — Discharge Planning (Signed)
?  ?  Durable Medical Equipment  ?(From admission, onward)  ?  ? ? ?  ? ?  Start     Ordered  ? 03/13/22 1410  For home use only DME standard manual wheelchair with seat cushion  Once       ?Comments: Patient suffers from b/l bka which impairs their ability to perform daily activities like bathing, dressing, feeding, grooming, and toileting in the home.  A cane, crutch, or walker will not resolve issue with performing activities of daily living. A wheelchair will allow patient to safely perform daily activities. Patient can safely propel the wheelchair in the home or has a caregiver who can provide assistance. Length of need Lifetime. ?Accessories: elevating leg rests (ELRs), wheel locks, extensions and anti-tippers.  ? 03/13/22 1409  ? ?  ?  ? ?  ? ? ?

## 2022-03-14 ENCOUNTER — Telehealth: Payer: Self-pay

## 2022-03-14 ENCOUNTER — Other Ambulatory Visit: Payer: Self-pay | Admitting: *Deleted

## 2022-03-14 LAB — BASIC METABOLIC PANEL
Anion gap: 6 (ref 5–15)
BUN: 15 mg/dL (ref 8–23)
CO2: 28 mmol/L (ref 22–32)
Calcium: 9 mg/dL (ref 8.9–10.3)
Chloride: 108 mmol/L (ref 98–111)
Creatinine, Ser: 1.45 mg/dL — ABNORMAL HIGH (ref 0.61–1.24)
GFR, Estimated: 54 mL/min — ABNORMAL LOW (ref >60)
Glucose, Bld: 286 mg/dL — ABNORMAL HIGH (ref 70–99)
Potassium: 3.5 mmol/L (ref 3.5–5.1)
Sodium: 142 mmol/L (ref 135–145)

## 2022-03-14 LAB — CBC WITH DIFFERENTIAL/PLATELET
Abs Immature Granulocytes: 0.02 10*3/uL (ref 0.00–0.07)
Basophils Absolute: 0 10*3/uL (ref 0.0–0.1)
Basophils Relative: 0 %
Eosinophils Absolute: 0.1 10*3/uL (ref 0.0–0.5)
Eosinophils Relative: 2 %
HCT: 33.9 % — ABNORMAL LOW (ref 39.0–52.0)
Hemoglobin: 11.4 g/dL — ABNORMAL LOW (ref 13.0–17.0)
Immature Granulocytes: 0 %
Lymphocytes Relative: 21 %
Lymphs Abs: 1.2 10*3/uL (ref 0.7–4.0)
MCH: 29.7 pg (ref 26.0–34.0)
MCHC: 33.6 g/dL (ref 30.0–36.0)
MCV: 88.3 fL (ref 80.0–100.0)
Monocytes Absolute: 0.5 10*3/uL (ref 0.1–1.0)
Monocytes Relative: 9 %
Neutro Abs: 4.1 10*3/uL (ref 1.7–7.7)
Neutrophils Relative %: 68 %
Platelets: 226 10*3/uL (ref 150–400)
RBC: 3.84 MIL/uL — ABNORMAL LOW (ref 4.22–5.81)
RDW: 15.4 % (ref 11.5–15.5)
WBC: 6 10*3/uL (ref 4.0–10.5)
nRBC: 0 % (ref 0.0–0.2)

## 2022-03-14 MED ORDER — OXYCODONE-ACETAMINOPHEN 5-325 MG PO TABS
2.0000 | ORAL_TABLET | Freq: Once | ORAL | Status: AC
  Administered 2022-03-14: 2 via ORAL
  Filled 2022-03-14: qty 2

## 2022-03-14 MED ORDER — FUROSEMIDE 10 MG/ML IJ SOLN
40.0000 mg | Freq: Once | INTRAMUSCULAR | Status: AC
  Administered 2022-03-14: 40 mg via INTRAVENOUS
  Filled 2022-03-14: qty 4
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
## 2022-03-14 NOTE — ED Notes (Signed)
Patient refused repeat vitals, requesting to stay.  Patient informed there wasn't a medical reason to admit him.  Patient then stated his shoulder hurt.  Patient given pain medication for the shoulder pain and given a clean brief and clean disposable scrub pants.  Patient voiced his displeasure about getting discharged but took the brief and pants.   ?

## 2022-03-14 NOTE — Patient Instructions (Signed)
Visit Information ? ?Mr. Tan was given information about Medicaid Managed Care team care coordination services as a part of their [facility] Medicaid benefit. [name] verbally consented to engagement with the [facility] Managed Care team.  ? ?If you are experiencing a medical emergency, please call 911 or report to your local emergency department or urgent care.  ? ?If you have a non-emergency medical problem during routine business hours, please contact your provider's office and ask to speak with a nurse.  ? ?For questions related to your [facility] health plan, please call: [phone] or go here:[email or web address] ? ?If you would like to schedule transportation through your [facility] plan, please call the following number at least 2 days in advance of your appointment: [phone]. ? You can also use the MTM portal or MTM mobile app to manage your rides. For the portal, please go to [email or web address] ? ?Call the Behavioral Health Crisis Line at [phone], at any time, 24 hours a day, 7 days a week. If you are in danger or need immediate medical attention call 911. ? ?If you would like help to quit smoking, call 1-800-QUIT-NOW (506-170-4294) OR Espa?ol: 1-855-D?jelo-Ya 3080941904) o para m?s informaci?n haga clic aqu? or Text READY to 200-400 to register via text ? ?Mr. Mierzwa - following are the goals we discussed in your visit today:  ? Goals Addressed   ?None ?  ? ? ?Social Worker will follow up in 7 days .  ? ?Gus Puma, BSW, [place] ?[facility]  ?High Risk Managed Medicaid Team  ?[phone]  ? ?Following is a copy of your plan of care:  ?There are no care plans that you recently modified to display for this patient. ?  ?

## 2022-03-14 NOTE — ED Notes (Signed)
Patient reports having vomiting and diarrhea while in the bathroom.  No episodes witnessed by this RN as patient is by himself.  MD made aware. ?

## 2022-03-14 NOTE — ED Notes (Signed)
Patient assisted to bathroom 

## 2022-03-14 NOTE — Patient Outreach (Signed)
Care Coordination ? ?03/14/2022 ? ?[name] ?1956/03/04 ?[id number] ? ?BSW received a message from [facility] that Erskine Squibb from [facility] and wellness may have an ALF option for patient. BSW spoke with Erskine Squibb and she stated [facility] does have a bed available. BSW contacted patient to discuss him going to this Alf and left a voicemail for patient.  ? ?Gus Puma, BSW, MHA ?[facility]  ?High Risk Managed Medicaid Team  ?[phone]  ?

## 2022-03-14 NOTE — Discharge Instructions (Signed)
Continue medications as previously prescribed. ° °Follow-up with your primary doctor in the next few days. °

## 2022-03-14 NOTE — Patient Outreach (Signed)
?Medicaid Managed Care   ?Nurse Care Manager Note ? ?03/14/2022 ?Name:  Scott Greer MRN:  [id number] DOB:  1956-04-27 ? ?Scott Greer is an 66 y.o. year old male who is a primary patient of Scott Pier, MD.  The [place] team was consulted for assistance with:    ?DMII ?Homelessness ? ?Scott Greer was given information about Medicaid Managed Care Coordination team services today. Scott Greer Patient agreed to services and verbal consent obtained. ? ?Engaged with patient by telephone for follow up visit in response to provider referral for case management and/or care coordination services.  ? ?Assessments/Interventions:  Review of past medical history, allergies, medications, health status, including review of consultants reports, laboratory and other test data, was performed as part of comprehensive evaluation and provision of chronic care management services. ? ?SDOH (Social Determinants of Health) assessments and interventions performed: ? ? ?Care Plan ? ?Allergies  ?Allergen Reactions  ? Bee Venom Hives, Itching and Swelling  ? ? ?Medications Reviewed Today   ? ? Reviewed by Scott Montane, RN (Registered Nurse) on 03/14/22 at 1311  Med List Status: <None>  ? ?Medication Order Taking? Sig Documenting Provider Last Dose Status Informant  ?amiodarone (PACERONE) 200 MG tablet [id number] No Take 1 tablet (200 mg total) by mouth daily.  ?Patient not taking: Reported on 02/27/2022  ? Scott Dresser, MD Not Taking Active   ?aspirin (ASPIRIN LOW DOSE) 81 MG chewable tablet [id number] No Chew 1 tablet (81 mg total) by mouth daily.  ?Patient not taking: Reported on 02/27/2022  ? Scott Dresser, MD Not Taking Active   ?atorvastatin (LIPITOR) 10 MG tablet [id number] No Take 1 tablet (10 mg total) by mouth daily with supper.  ?Patient not taking: Reported on 12/01/2021  ? Scott Dresser, MD Not Taking Active   ?Scott Greer 20-37.5 MG tablet [id number] No TAKE ONE TABLET BY MOUTH THREE TIMES  DAILY Needs appointment for further refills  ?Patient not taking: Reported on 02/27/2022  ? Scott Dresser, MD Not Taking Active   ?Blood Glucose Monitoring Suppl (TRUE METRIX METER) w/Device KIT [id number] No Use as directed  ?Patient not taking: Reported on 11/20/2021  ? Scott Pier, MD Not Taking Active Other  ?carvedilol (COREG) 3.125 MG tablet [id number] No TAKE ONE TABLET BY MOUTH EVERY MORNING and TAKE ONE TABLET BY MOUTH EVERY EVENING WITH A MEAL  ?Patient not taking: Reported on 02/27/2022  ? Scott Dresser, MD Not Taking Active   ?digoxin (LANOXIN) 0.125 MG tablet [id number] No Take 1 tablet (0.125 mg total) by mouth daily with supper.  ?Patient not taking: Reported on 02/27/2022  ? Scott Dresser, MD Not Taking Active   ?empagliflozin (JARDIANCE) 10 MG TABS tablet [id number] No Take 1 tablet (10 mg total) by mouth daily.  ?Patient not taking: Reported on 02/27/2022  ? Scott Dresser, MD Not Taking Active   ?FEROSUL 325 (65 Fe) MG tablet [id number] No TAKE ONE TABLET BY MOUTH EVERY MORNING WITH BREAKFAST  ?Patient not taking: Reported on 12/01/2021  ? Scott Pier, MD Not Taking Active   ?gabapentin (NEURONTIN) 300 MG capsule [id number] No TAKE ONE CAPSULE BY MOUTH THREE TIMES DAILY  ?Patient not taking: Reported on 02/27/2022  ? Scott Pier, MD Not Taking Active   ?glucose blood (TRUE METRIX BLOOD GLUCOSE TEST) test strip [id number] No Use as instructed  ?Patient not taking: Reported on 11/20/2021  ? Scott Pier, MD Not Taking Active  Other  ?metFORMIN (GLUCOPHAGE) 500 MG tablet [id number] No TAKE ONE TABLET BY MOUTH TWICE DAILY WITH A MEAL  ?Patient not taking: Reported on 03/14/2022  ? Scott Pier, MD Not Taking Active   ?nutrition supplement, [name]) PACK [id number] No Take 1 packet by mouth 2 (two) times daily between meals.  ?Patient not taking: Reported on 11/20/2021  ? Scott Greer, [name] Not Taking Active   ?oxyCODONE (ROXICODONE) 5 MG immediate release tablet  [id number] No Take 1 tablet (5 mg total) by mouth every 8 (eight) hours as needed for severe pain.  ?Patient not taking: Reported on 12/01/2021  ? Scott Slick, NP Not Taking Active   ?potassium chloride SA (KLOR-CON M) 20 MEQ tablet [id number] No Take 1 tablet (20 mEq total) by mouth daily. Absolute last refill without office visit please call [phone] to schedule  ?Patient not taking: Reported on 03/14/2022  ? [name], [name], MD Not Taking Active   ?sacubitril-valsartan (ENTRESTO) 24-26 MG [id number] No Take 1 tablet by mouth 2 (two) times daily. LAST REFILL NEEDS TO KEEP FOLLOW UP APPOINTMENT FOR ANYMORE REFILLS  ?Patient not taking: Reported on 02/27/2022  ? Scott Dresser, MD Not Taking Active   ?spironolactone (ALDACTONE) 25 MG tablet [id number] No Take 1 tablet (25 mg total) by mouth every morning. Absolute last refill without office visit please call [phone] to schedule  ?Patient not taking: Reported on 03/14/2022  ? [name], [name], MD Not Taking Active   ?torsemide (DEMADEX) 20 MG tablet [id number] No Take 1 tablet (20 mg total) by mouth daily. Absolute last refill without office visit please call [phone] to schedule  ?Patient not taking: Reported on 03/14/2022  ? [name], [name], MD Not Taking Active   ?TRUEplus Lancets 28G MISC [id number] No Use as directed  ?Patient not taking: Reported on 11/20/2021  ? Scott Pier, MD Not Taking Active Other  ?Vitamin D, Ergocalciferol, (DRISDOL) 1.25 MG (50000 UNIT) CAPS capsule [id number] No TAKE ONE CAPSULE BY MOUTH ONCE WEEKLY ON SUNDAY  ?Patient not taking: Reported on 12/01/2021  ? Scott Pier, MD Not Taking Active   ?zinc sulfate 220 (50 Zn) MG capsule [id number] No Take 1 capsule (220 mg total) by mouth daily.  ?Patient not taking: Reported on 08/01/2021  ? Scott Greer, [name] Not Taking Active   ? ?  ?  ? ?  ? ? ?Patient Active Problem List  ? Diagnosis Date Noted  ? Chronic combined systolic and diastolic CHF (congestive heart  failure) ([place]) 12/01/2021  ? S/P BKA (below knee amputation) bilateral ([place]) 07/18/2021  ? Acute hematogenous osteomyelitis of right foot ([place]) 04/26/2021  ? Gangrene of right foot ([place])   ? Foot pain, right 04/14/2021  ? Subacute osteomyelitis, right ankle and foot ([place])   ? Status post below-knee amputation ([place]) 02/17/2021  ? Wound dehiscence   ? Gangrene of toe of left foot ([place])   ? Cutaneous abscess of left foot   ? Left foot infection 02/07/2021  ? Leukocytosis 02/07/2021  ? Thrombocytosis 02/07/2021  ? Hyponatremia 02/07/2021  ? AKI (acute kidney injury) ([place]) 02/07/2021  ? Hyperglycemia due to diabetes mellitus ([place]) 02/07/2021  ? Hypoalbuminemia due to protein-calorie malnutrition ([place]) 02/07/2021  ? Osteomyelitis of second toe of left foot ([place])   ? New onset of congestive heart failure ([place]) 12/26/2020  ? CKD (chronic kidney disease) stage 3, GFR 30-59 ml/min (H[facility]) 12/26/2020  ? Acute systolic CHF (congestive heart failure) ([facility]) 12/26/2020  ?  Anemia, chronic disease 06/22/2020  ? Amputee, great toe, right ([place]) 06/20/2020  ? Normocytic anemia 06/20/2020  ? Erectile dysfunction associated with type 2 diabetes mellitus ([place]) 06/20/2020  ? Positive for macroalbuminuria 10/24/2019  ? Amputation of left great toe ([place]) 10/23/2019  ? Tobacco dependence 10/23/2019  ? Osteomyelitis of great toe of right foot ([place]) 06/04/2019  ? Diabetic foot infection ([place])   ? Noncompliance   ? Glaucoma   ? Hyperlipidemia   ? Essential hypertension 11/06/2003  ? Diabetes mellitus ([place]) 11/05/1997  ? ? ?Conditions to be addressed/monitored per PCP order:  DMII and Homelessness ? ?Care Plan : RN Care Manager Plan of Care  ?Updates made by Scott Montane, RN since 03/14/2022 12:00 AM  ?  ? ?Problem: Health Management needs related to DMII   ?  ? ?Long-Range Goal: Development of Plan of Care to address Health Management needs related to DMII   ?Start Date: 02/27/2022  ?Expected End Date: 05/28/2022  ?Priority: High  ?Note:    ?Current Barriers:  ?Care Coordination needs related to Housing barriers  ?Chronic Disease Management support and education needs related to DMII, Homelessness, and Stress  ?Mr. Gaertner was evaluated twice over the

## 2022-03-14 NOTE — Patient Outreach (Signed)
Care Coordination ? ?03/14/2022 ? ?Scott Greer ?06-Mar-1956 ?[id number] ? ?BSW received a message from [facility] about patient being discharged from the ED. BSW informed RNCM she would contact patient. Patient contacted BSW 10 times stating he did not have anywhere to go. BSW contacted several agencies and no one has any beds. Patient stated he was waiting on a family member to come and pick him up from the hospital, he has been there since discharge at [facility]. BSW provided patient with the telephone number for [facility]. BSW spoke with patient about going into an ALF, patient stated yes he would go because he has no where else to go. ? ?Scott Greer, BSW, MHA ?[facility]  ?High Risk Managed Medicaid Team  ?[phone]  ?

## 2022-03-15 ENCOUNTER — Telehealth: Payer: Self-pay

## 2022-03-15 NOTE — Patient Instructions (Signed)
Visit Information ? ?[name]  - as a part of your Medicaid benefit, you are eligible for care management and care coordination services at no cost or copay. I was unable to reach you by phone today but would be happy to help you with your health related needs. Please feel free to call me [phone].  ?.  ?[phone], BSW, Gus Puma ?[facility] [place] [facility]  ?High Risk Managed Medicaid Team  ?[phone]  ?

## 2022-03-15 NOTE — Patient Outreach (Signed)
Care Coordination ? ?03/15/2022 ? ?[name] ?1956-10-18 ?[id number] ? ? ?Medicaid Managed Care  ? ?Unsuccessful Outreach Note ? ?03/15/2022 ?Name: Scott Greer MRN: [id number] DOB: 01/19/1956 ? ?Referred by: [name], MD ?Reason for referral : High Risk Managed Medicaid (MM Social work Unsuccessful telephone outreach) ? ? ?An unsuccessful telephone outreach was attempted today. The patient was referred to the case management team for assistance with care management and care coordination.  ? ?Follow Up Plan: A HIPAA compliant phone message was left for the patient providing contact information and requesting a return call.  ? ?Gus Puma, BSW, MHA ?[facility] [employer] [facility]  ?High Risk Managed Medicaid Team  ?[phone]  ?

## 2022-03-18 IMAGING — CR DG KNEE COMPLETE 4+V*R*
5 series · 5 of 5 positions shown · non-contrast
Comparison: None.

CLINICAL DATA: Below the knee amputation 1 week ago with swelling
in the stump.

EXAM:
RIGHT KNEE - COMPLETE 4+ VIEW

[knee ap]
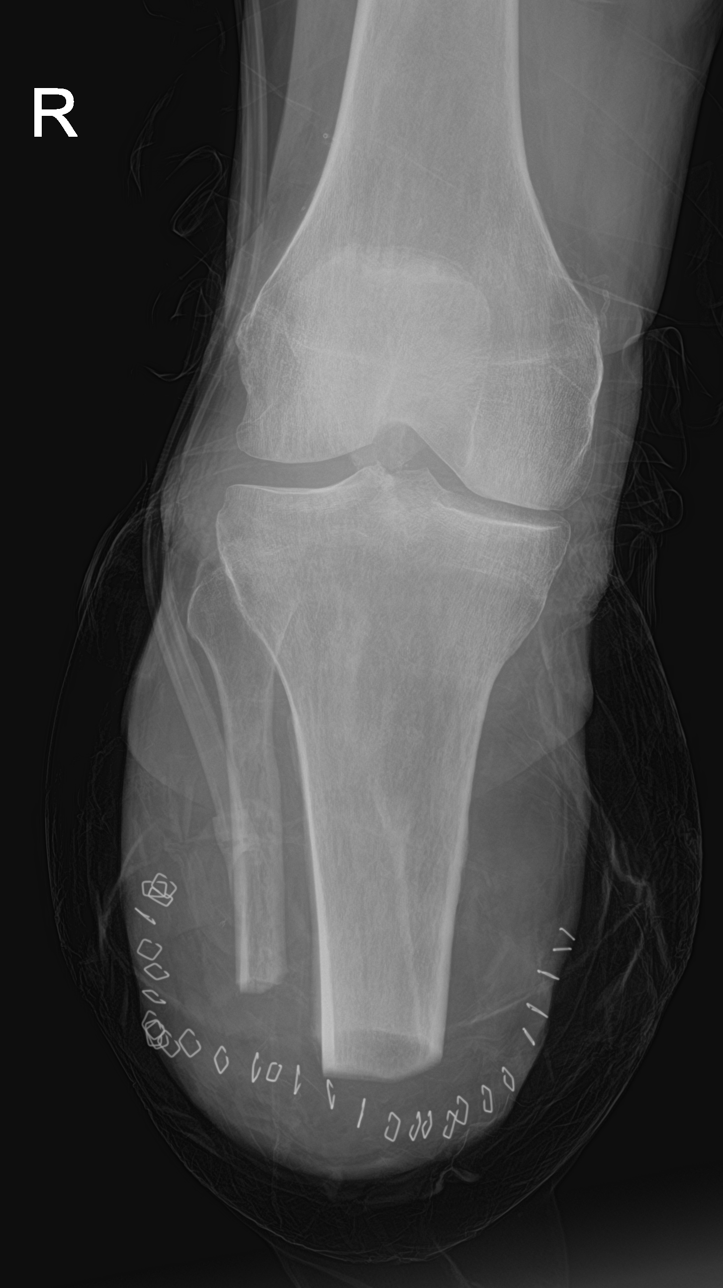

[knee lat (1 of 2)]
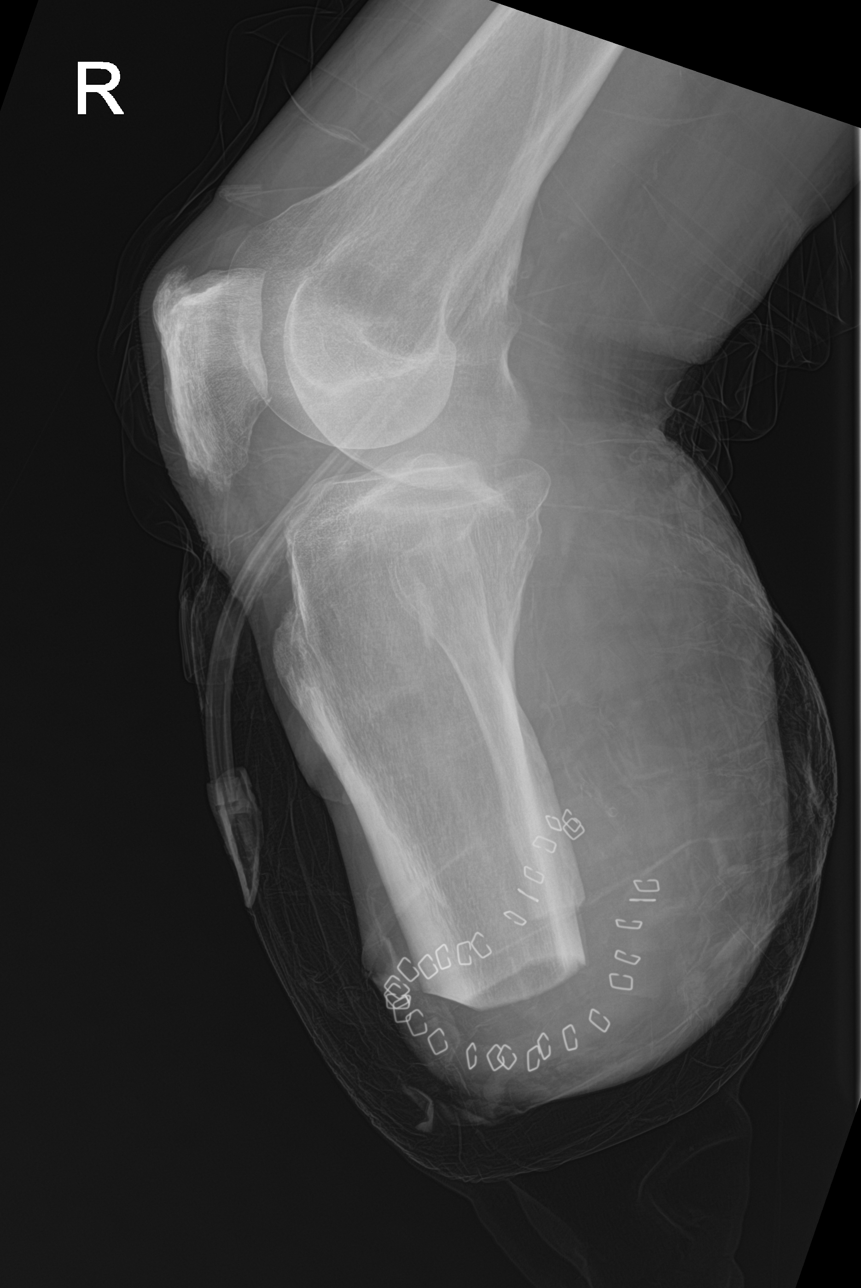

[knee obl (1 of 2)]
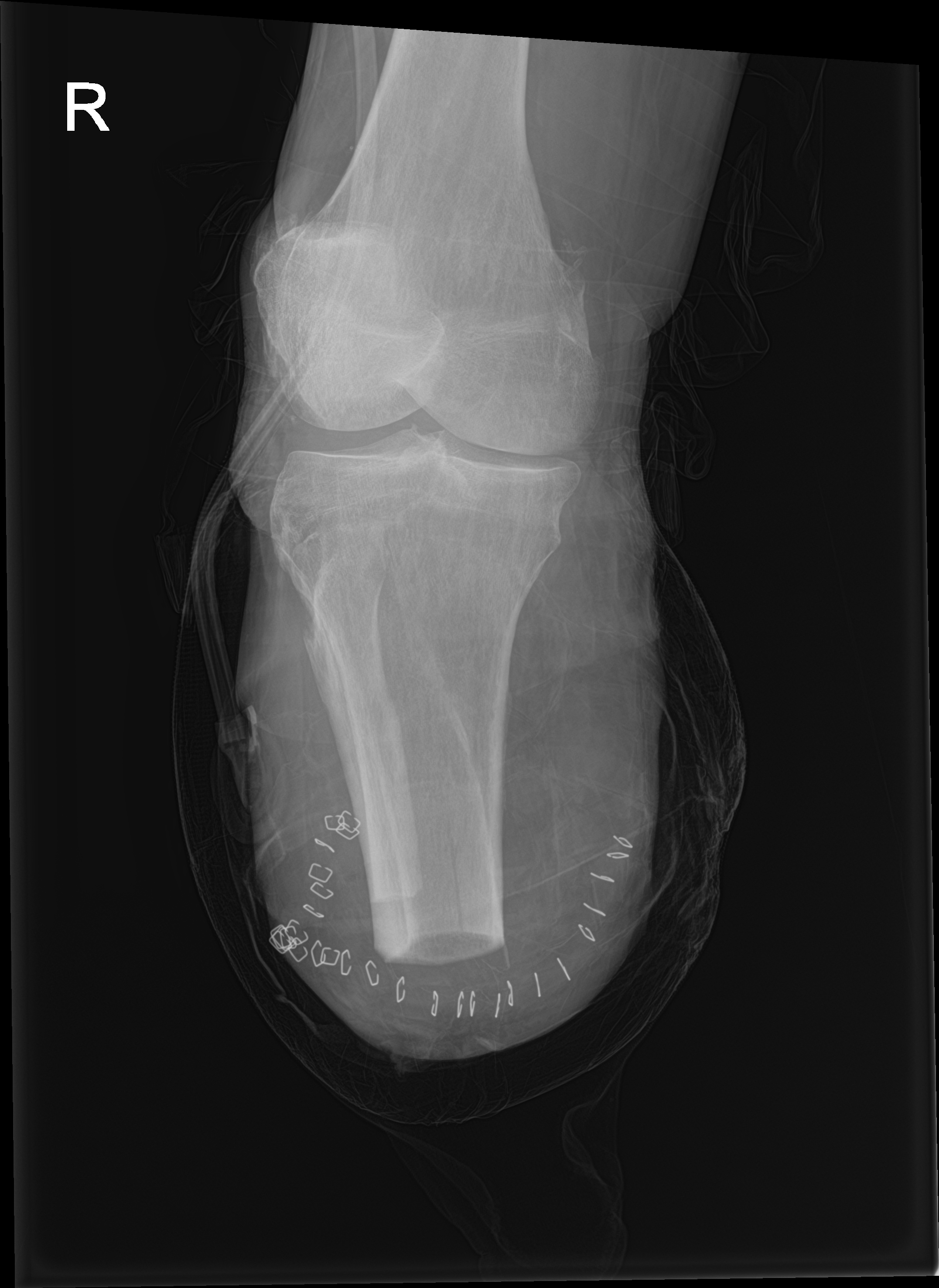

[knee obl (2 of 2)]
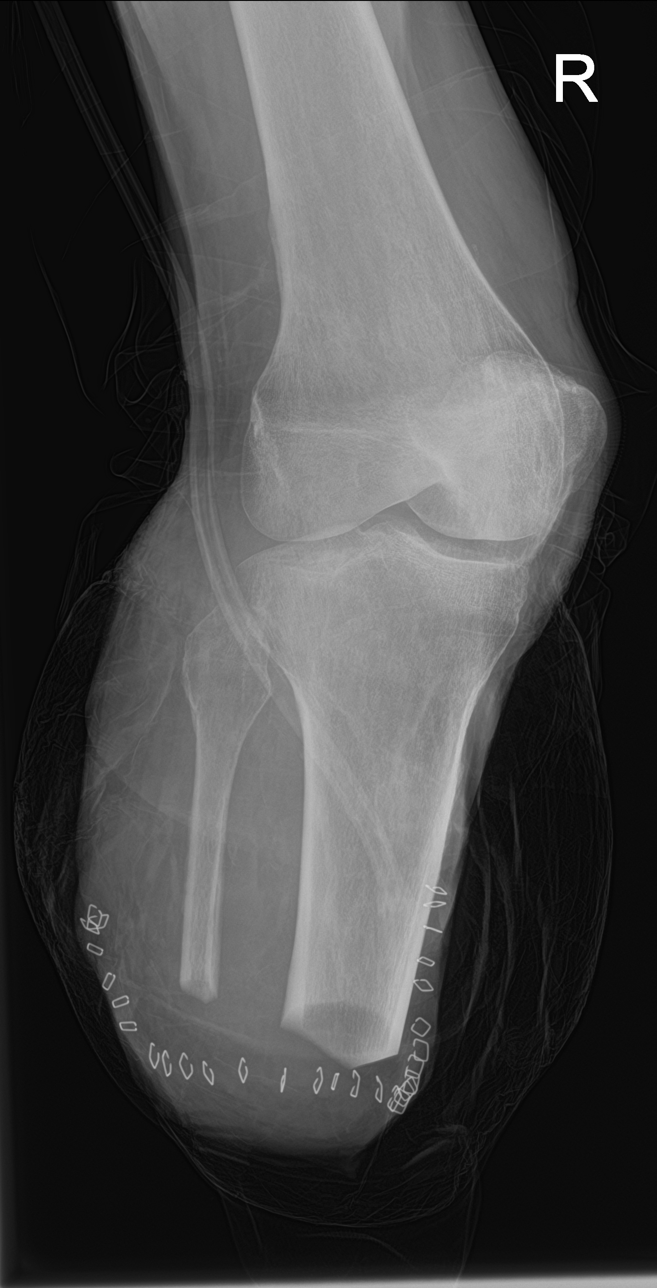

[knee lat (2 of 2)]
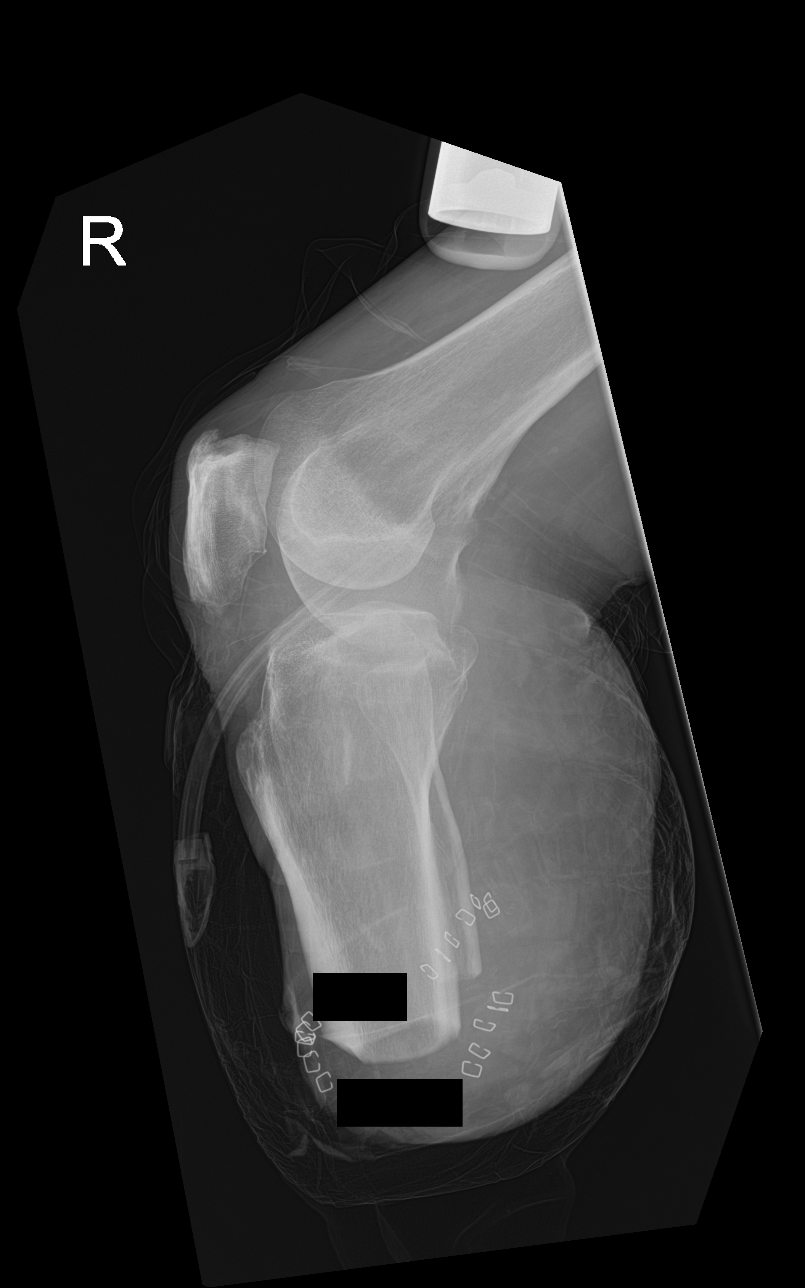

[5 of 5 positions shown; findings below may reference images not displayed]

FINDINGS: Postoperative changes with right below the knee amputation. Skin
clips are present consistent with recent surgery. Margin of
resection appears intact without evidence of erosion or sclerosis.
Soft tissues are unremarkable. No radiopaque soft tissue foreign
body or gas is identified. Mild degenerative changes in the knee
with medial compartment narrowing. No significant effusions.
IMPRESSION: Postoperative below the knee amputation without apparent
radiographic complication.

## 2022-03-19 ENCOUNTER — Other Ambulatory Visit: Payer: Self-pay

## 2022-03-19 ENCOUNTER — Emergency Department (HOSPITAL_COMMUNITY): Payer: Medicaid Other

## 2022-03-19 ENCOUNTER — Telehealth (HOSPITAL_COMMUNITY): Payer: Self-pay | Admitting: Licensed Clinical Social Worker

## 2022-03-19 ENCOUNTER — Encounter (HOSPITAL_COMMUNITY): Payer: Self-pay

## 2022-03-19 ENCOUNTER — Emergency Department (HOSPITAL_COMMUNITY)
Admission: EM | Admit: 2022-03-19 | Discharge: 2022-03-19 | Disposition: A | Discharge status: Home / Self Care | Payer: Medicaid Other | Attending: Emergency Medicine | Admitting: Emergency Medicine

## 2022-03-19 ENCOUNTER — Other Ambulatory Visit (HOSPITAL_COMMUNITY): Payer: Self-pay

## 2022-03-19 DIAGNOSIS — E1121 Type 2 diabetes mellitus with diabetic nephropathy: Secondary | ICD-10-CM

## 2022-03-19 DIAGNOSIS — M79661 Pain in right lower leg: Secondary | ICD-10-CM | POA: Insufficient documentation

## 2022-03-19 DIAGNOSIS — W19XXXA Unspecified fall, initial encounter: Secondary | ICD-10-CM

## 2022-03-19 DIAGNOSIS — M25461 Effusion, right knee: Secondary | ICD-10-CM | POA: Diagnosis not present

## 2022-03-19 DIAGNOSIS — M7989 Other specified soft tissue disorders: Secondary | ICD-10-CM | POA: Diagnosis not present

## 2022-03-19 DIAGNOSIS — Z7982 Long term (current) use of aspirin: Secondary | ICD-10-CM | POA: Diagnosis not present

## 2022-03-19 DIAGNOSIS — Z5900 Homelessness unspecified: Secondary | ICD-10-CM | POA: Diagnosis not present

## 2022-03-19 DIAGNOSIS — Z743 Need for continuous supervision: Secondary | ICD-10-CM | POA: Diagnosis not present

## 2022-03-19 DIAGNOSIS — S8992XA Unspecified injury of left lower leg, initial encounter: Secondary | ICD-10-CM | POA: Diagnosis present

## 2022-03-19 DIAGNOSIS — R609 Edema, unspecified: Secondary | ICD-10-CM | POA: Diagnosis not present

## 2022-03-19 DIAGNOSIS — E782 Mixed hyperlipidemia: Secondary | ICD-10-CM

## 2022-03-19 DIAGNOSIS — M25561 Pain in right knee: Secondary | ICD-10-CM | POA: Diagnosis not present

## 2022-03-19 DIAGNOSIS — M25562 Pain in left knee: Secondary | ICD-10-CM | POA: Diagnosis not present

## 2022-03-19 DIAGNOSIS — E119 Type 2 diabetes mellitus without complications: Secondary | ICD-10-CM

## 2022-03-19 DIAGNOSIS — M79662 Pain in left lower leg: Secondary | ICD-10-CM | POA: Diagnosis not present

## 2022-03-19 DIAGNOSIS — E559 Vitamin D deficiency, unspecified: Secondary | ICD-10-CM

## 2022-03-19 MED ORDER — VITAMIN D (ERGOCALCIFEROL) 1.25 MG (50000 UNIT) PO CAPS
ORAL_CAPSULE | ORAL | 0 refills | Status: DC
  Filled 2022-03-19: qty 4, 28d supply, fill #0

## 2022-03-19 MED ORDER — TORSEMIDE 20 MG PO TABS
20.0000 mg | ORAL_TABLET | Freq: Every day | ORAL | Status: DC
  Administered 2022-03-19: 20 mg via ORAL
  Filled 2022-03-19: qty 1

## 2022-03-19 MED ORDER — ACETAMINOPHEN 500 MG PO TABS
1000.0000 mg | ORAL_TABLET | Freq: Once | ORAL | Status: AC
  Administered 2022-03-19: 1000 mg via ORAL
  Filled 2022-03-19: qty 2

## 2022-03-19 MED ORDER — ASPIRIN 81 MG PO CHEW
81.0000 mg | CHEWABLE_TABLET | Freq: Every day | ORAL | 0 refills | Status: DC
  Filled 2022-03-19: qty 30, 30d supply, fill #0

## 2022-03-19 MED ORDER — ISOSORB DINITRATE-HYDRALAZINE 20-37.5 MG PO TABS
ORAL_TABLET | ORAL | 0 refills | Status: DC
Start: 2022-03-19 — End: 2022-04-04
  Filled 2022-03-19: qty 60, 30d supply, fill #0

## 2022-03-19 MED ORDER — SPIRONOLACTONE 25 MG PO TABS
25.0000 mg | ORAL_TABLET | Freq: Every morning | ORAL | Status: DC
  Administered 2022-03-19: 25 mg via ORAL
  Filled 2022-03-19: qty 1

## 2022-03-19 MED ORDER — CARVEDILOL 3.125 MG PO TABS
ORAL_TABLET | ORAL | 0 refills | Status: DC
  Filled 2022-03-19: qty 60, 30d supply, fill #0

## 2022-03-19 MED ORDER — SACUBITRIL-VALSARTAN 24-26 MG PO TABS
1.0000 | ORAL_TABLET | Freq: Once | ORAL | Status: AC
  Administered 2022-03-19: 1 via ORAL
  Filled 2022-03-19: qty 1

## 2022-03-19 MED ORDER — ACCU-CHEK GUIDE W/DEVICE KIT
PACK | 0 refills | Status: DC
  Filled 2022-03-19: qty 1, 30d supply, fill #0

## 2022-03-19 MED ORDER — METFORMIN HCL 500 MG PO TABS
500.0000 mg | ORAL_TABLET | Freq: Two times a day (BID) | ORAL | 0 refills | Status: DC
  Filled 2022-03-19: qty 60, 30d supply, fill #0

## 2022-03-19 MED ORDER — AMIODARONE HCL 200 MG PO TABS
200.0000 mg | ORAL_TABLET | Freq: Every day | ORAL | 0 refills | Status: DC
  Filled 2022-03-19: qty 30, 30d supply, fill #0

## 2022-03-19 MED ORDER — ACCU-CHEK SOFTCLIX LANCETS MISC
0 refills | Status: DC
  Filled 2022-03-19: qty 100, 25d supply, fill #0

## 2022-03-19 MED ORDER — EMPAGLIFLOZIN 10 MG PO TABS
10.0000 mg | ORAL_TABLET | Freq: Every day | ORAL | 0 refills | Status: DC
  Filled 2022-03-19: qty 30, 30d supply, fill #0

## 2022-03-19 MED ORDER — ACCU-CHEK GUIDE VI STRP
ORAL_STRIP | 12 refills | Status: DC
  Filled 2022-03-19: qty 100, 25d supply, fill #0

## 2022-03-19 MED ORDER — METFORMIN HCL 500 MG PO TABS
500.0000 mg | ORAL_TABLET | Freq: Once | ORAL | Status: AC
  Administered 2022-03-19: 500 mg via ORAL
  Filled 2022-03-19: qty 1

## 2022-03-19 MED ORDER — CARVEDILOL 3.125 MG PO TABS
3.1250 mg | ORAL_TABLET | Freq: Once | ORAL | Status: AC
  Administered 2022-03-19: 3.125 mg via ORAL
  Filled 2022-03-19: qty 1

## 2022-03-19 MED ORDER — SPIRONOLACTONE 25 MG PO TABS
25.0000 mg | ORAL_TABLET | Freq: Every morning | ORAL | 0 refills | Status: DC
  Filled 2022-03-19: qty 30, 30d supply, fill #0

## 2022-03-19 MED ORDER — DIGOXIN 125 MCG PO TABS
0.1250 mg | ORAL_TABLET | Freq: Every day | ORAL | 0 refills | Status: DC
  Filled 2022-03-19: qty 30, 30d supply, fill #0

## 2022-03-19 MED ORDER — ATORVASTATIN CALCIUM 10 MG PO TABS
10.0000 mg | ORAL_TABLET | Freq: Every day | ORAL | 0 refills | Status: DC
  Filled 2022-03-19: qty 30, 30d supply, fill #0

## 2022-03-19 MED ORDER — ISOSORB DINITRATE-HYDRALAZINE 20-37.5 MG PO TABS
1.0000 | ORAL_TABLET | Freq: Once | ORAL | Status: AC
  Administered 2022-03-19: 1 via ORAL
  Filled 2022-03-19: qty 1

## 2022-03-19 MED ORDER — TORSEMIDE 20 MG PO TABS
20.0000 mg | ORAL_TABLET | Freq: Every day | ORAL | 0 refills | Status: DC
  Filled 2022-03-19: qty 30, 30d supply, fill #0

## 2022-03-19 MED ORDER — GABAPENTIN 300 MG PO CAPS
300.0000 mg | ORAL_CAPSULE | Freq: Three times a day (TID) | ORAL | 0 refills | Status: DC
  Filled 2022-03-19: qty 90, 30d supply, fill #0

## 2022-03-19 MED ORDER — FERROUS SULFATE 325 (65 FE) MG PO TABS
ORAL_TABLET | ORAL | 1 refills | Status: DC
  Filled 2022-03-19: qty 30, 30d supply, fill #0

## 2022-03-19 NOTE — ED Triage Notes (Signed)
Pt BIB [facility] c/o a fall from his wheelchair this morning. Pt is homeless and is a bilateral BKA. Pt is c/o of swelling and bilateral stump pain. ?

## 2022-03-19 NOTE — ED Provider Triage Note (Signed)
Emergency Medicine Provider Triage Evaluation Note ? ?Scott Greer , a 65 y.o. male  was evaluated in triage.  Pt complains of amputation pain.  Patient reports that he fell onto his bilateral stumps this morning. ? ?Review of Systems  ?Positive: Pain ?Negative: Laceration ? ?Physical Exam  ?BP (!) 195/118 (BP Location: Left Arm)   Pulse 74   Temp 97.6 ?F (36.4 ?C) (Oral)   Resp 16   SpO2 95%  ?Gen:   Awake, no distress   ?Resp:  Normal effort  ?MSK:   Moves extremities without difficulty  ?Other:  Skin intact, no wound dehiscence. ? ?Medical Decision Making  ?Medically screening exam initiated at 9:12 AM.  Appropriate orders placed.  Scott Greer was informed that the remainder of the evaluation will be completed by another provider, this initial triage assessment does not replace that evaluation, and the importance of remaining in the ED until their evaluation is complete. ? ? ?  ?Saddie Benders, PA-C ?03/19/22 0913 ? ?

## 2022-03-19 NOTE — ED Notes (Signed)
Pt wheeled out to ED lobby. Refused taxi cab back to the street where his wheelchair is at. Patient also refused medications brought by pharmacy. Patient stable when leaving main ED.  ?

## 2022-03-19 NOTE — Telephone Encounter (Signed)
CSW saw that pt was currently in the ED so CSW went to meet with him to discuss ALF option that CHW RNCM Erskine Squibb had looked into for him. ? ?Pt currently against ALF placement because he would lose his SSI check.  CSW reiterated to pt that ALF would provide all his basic needs which is why he would not longer receive his check but pt not willing to consider at this time. ? ?Burna Sis, LCSW ?Clinical Social Worker ?[facility] ?Desk#: [phone] ?Cell#: [phone] ? ?

## 2022-03-19 NOTE — ED Notes (Signed)
Pt states he has not taken any meds since he left here the last time he was here-- states his stumps are swollen, homeless and no money per patient ?

## 2022-03-19 NOTE — Patient Outreach (Addendum)
[facility] Customer service manager [facility]) Care Management ? ?03/19/2022 ? ?Scott Greer ?1956-07-26 ?[id number] ? ?LCSW completed [facility] outreach attempt today during scheduled appointment time but was unable to reach patient successfully. A HIPPA compliant voice message was left encouraging patient to return call once available. [facility] LCSW completed chart review and saw that patient is currently in the ED. LCSW will ask Scheduling Care Guide to reschedule [facility] SW appointment with patient once he discharges back home.  ? ?Dickie La, BSW, MSW, LCSW ?Managed Medicaid LCSW ?[place]  [facility] ?[email or web address] ?Phone: [phone] ? ? ? ?
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
## 2022-03-19 NOTE — Discharge Summary (Incomplete)
ED RNCM  received consult for medication assistance and homeless issues.   Patient is a bil BKA and homeless.   ?

## 2022-03-19 NOTE — Progress Notes (Signed)
Transition of Care [facility]) - Emergency Department Mini Assessment ? ? ?Patient Details  ?Name: Scott Greer ?MRN: [id number] ?Date of Birth: [birth year] ? ?Transition of Care (TOC) CM/SW Contact:    ?Roseanne Kaufman, RN ?Phone Number: ?03/19/2022, 1:30 PM ? ? ?Clinical Narrative: ?[facility] [place] [facility] with c/o fall resulting in swelling and bilateral stump pain (BKA). RNCM received TOC consult for medication assistance. ? ? ?ED Mini Assessment: ? Per review patient has medical insurance, and not eligible for MATCH. This RNCM attempted to call patient's phone #  [phone] and reached voicemail, left call back phone number. Will notify bedside RN. ? ? ?Patient Contact and Communications ?  ? Admission diagnosis:  Fall Bilateral Leg pain ?Patient Active Problem List  ? Diagnosis Date Noted  ? Chronic combined systolic and diastolic CHF (congestive heart failure) ([place]) 12/01/2021  ? S/P BKA (below knee amputation) bilateral ([place]) 07/18/2021  ? Acute hematogenous osteomyelitis of right foot ([place]) 04/26/2021  ? Gangrene of right foot ([place])   ? Foot pain, right 04/14/2021  ? Subacute osteomyelitis, right ankle and foot ([place])   ? Status post below-knee amputation ([place]) 02/17/2021  ? Wound dehiscence   ? Gangrene of toe of left foot ([place])   ? Cutaneous abscess of left foot   ? Left foot infection 02/07/2021  ? Leukocytosis 02/07/2021  ? Thrombocytosis 02/07/2021  ? Hyponatremia 02/07/2021  ? AKI (acute kidney injury) ([place]) 02/07/2021  ? Hyperglycemia due to diabetes mellitus ([place]) 02/07/2021  ? Hypoalbuminemia due to protein-calorie malnutrition ([place]) 02/07/2021  ? Osteomyelitis of second toe of left foot ([place])   ? New onset of congestive heart failure ([place]) 12/26/2020  ? CKD (chronic kidney disease) stage 3, GFR 30-59 ml/min (H[facility]) 12/26/2020  ? Acute systolic CHF (congestive heart failure) ([place]) 12/26/2020  ? Anemia, chronic disease 06/22/2020  ? Amputee, great toe, right ([place]) 06/20/2020  ? Normocytic anemia  06/20/2020  ? Erectile dysfunction associated with type 2 diabetes mellitus ([place]) 06/20/2020  ? Positive for macroalbuminuria 10/24/2019  ? Amputation of left great toe ([place]) 10/23/2019  ? Tobacco dependence 10/23/2019  ? Osteomyelitis of great toe of right foot ([place]) 06/04/2019  ? Diabetic foot infection ([place])   ? Noncompliance   ? Glaucoma   ? Hyperlipidemia   ? Essential hypertension 11/06/2003  ? Diabetes mellitus ([place]) 11/05/1997  ? ?PCP:  [name], MD ?Pharmacy:   ?[facility] - [place], [place] - [address] ?[address] ?[place] [address] ?Phone: [phone] Fax: [phone] ? ?[id number] - [place], [place] [address] ?[place] [address] ?Phone: [phone] Fax: [phone] ?  ?

## 2022-03-19 NOTE — Patient Instructions (Signed)
[name] ,  ? ?The [facility] Managed Care Team is available to provide assistance to you with your healthcare needs at no cost and as a benefit of your [facility] Health plan. I'm sorry I was unable to reach you today for our scheduled appointment. Our care guide will call you to reschedule our telephone appointment. Please call me at the number below. I am available to be of assistance to you regarding your healthcare needs. .  ? ?Thank you,  ? ?Dickie La, BSW, MSW, LCSW ?Managed Medicaid LCSW ?[place]  [facility] ?[email or web address] ?Phone: [phone] ? ? ?

## 2022-03-20 ENCOUNTER — Other Ambulatory Visit: Payer: Self-pay | Admitting: *Deleted

## 2022-03-20 ENCOUNTER — Emergency Department (HOSPITAL_COMMUNITY)
Admission: EM | Admit: 2022-03-20 | Discharge: 2022-03-20 | Disposition: A | Discharge status: Home / Self Care | Payer: Medicaid Other | Attending: Emergency Medicine | Admitting: Emergency Medicine

## 2022-03-20 DIAGNOSIS — E119 Type 2 diabetes mellitus without complications: Secondary | ICD-10-CM | POA: Insufficient documentation

## 2022-03-20 DIAGNOSIS — I509 Heart failure, unspecified: Secondary | ICD-10-CM | POA: Insufficient documentation

## 2022-03-20 DIAGNOSIS — M549 Dorsalgia, unspecified: Secondary | ICD-10-CM | POA: Diagnosis present

## 2022-03-20 DIAGNOSIS — W19XXXA Unspecified fall, initial encounter: Secondary | ICD-10-CM | POA: Diagnosis not present

## 2022-03-20 DIAGNOSIS — Z7984 Long term (current) use of oral hypoglycemic drugs: Secondary | ICD-10-CM | POA: Insufficient documentation

## 2022-03-20 DIAGNOSIS — Z79899 Other long term (current) drug therapy: Secondary | ICD-10-CM | POA: Diagnosis not present

## 2022-03-20 DIAGNOSIS — I110 Hypertensive heart disease with heart failure: Secondary | ICD-10-CM | POA: Diagnosis not present

## 2022-03-20 DIAGNOSIS — M545 Low back pain, unspecified: Secondary | ICD-10-CM | POA: Diagnosis not present

## 2022-03-20 DIAGNOSIS — M79604 Pain in right leg: Secondary | ICD-10-CM | POA: Diagnosis not present

## 2022-03-20 DIAGNOSIS — Z7982 Long term (current) use of aspirin: Secondary | ICD-10-CM | POA: Diagnosis not present

## 2022-03-20 DIAGNOSIS — M79605 Pain in left leg: Secondary | ICD-10-CM | POA: Diagnosis not present

## 2022-03-20 DIAGNOSIS — M5459 Other low back pain: Secondary | ICD-10-CM | POA: Diagnosis not present

## 2022-03-20 MED ORDER — KETOROLAC TROMETHAMINE 30 MG/ML IJ SOLN
15.0000 mg | Freq: Once | INTRAMUSCULAR | Status: AC
  Administered 2022-03-20: 15 mg via INTRAMUSCULAR
  Filled 2022-03-20: qty 1

## 2022-03-20 NOTE — ED Triage Notes (Signed)
Pt. Stated, Im having back, and leg pain for 2 and half years. ?Pt is homeless and requested Child psychotherapist. ?

## 2022-03-20 NOTE — Patient Instructions (Signed)
Visit Information  [name]  - as a part of your Medicaid benefit, you are eligible for care management and care coordination services at no cost or copay. I was unable to reach you by phone today but would be happy to help you with your health related needs. Please feel free to call me @ [phone].   A member of the Managed Medicaid care management team will reach out to you again over the next 7 days.   Siham Bucaro RN, BSN [place]  [facility] RN Care Coordinator   

## 2022-03-20 NOTE — Discharge Instructions (Addendum)
Please continue take your home medications as prescribed.  I will provide information regarding potential shelters/homes you can look into staying at.  Continue to elevate your lower extremities.  This will help draw fluid off your legs and help with the pain.  Please follow-up with your primary care provider for further management of symptoms.  Your blood pressure was elevated in the hospital.  This may be due to not taking a blood pressure medicine, but you may need blood pressure medication adjustment by your primary care provider.  Communicate with your primary care about potential prosthetics to make ambulation easier.  Consider taking the offer of assisted living facility for future residents and care.  Please not hesitate to return to the emergency department for worrisome signs and symptoms we discussed become apparent. ?
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
## 2022-03-20 NOTE — Patient Outreach (Signed)
Care Coordination ? ?03/20/2022 ? ?[name] ?Jan 06, 1956 ?[id number] ? ? ?Medicaid Managed Care  ? ?Unsuccessful Outreach Note ? ?03/20/2022 ?Name: Scott Greer MRN: [id number] DOB: 03/05/1956 ? ?Referred by: [name], MD ?Reason for referral : High Risk Managed Medicaid (Unsuccessful RNCM telephone outreach) ? ? ?An unsuccessful telephone outreach was attempted today. The patient was referred to the case management team for assistance with care management and care coordination.  ? ?RNCM attempting to reach patient for follow up visit. RNCM aware of patient being evaluated in the ED at this time and will attempt to reach patient once discharged. ? ?Follow Up Plan:  RNCM will follow up with a telephone outreach to patient following discharge from the ED. ? ? ?Estanislado Emms RN, BSN ?[place]  [facility] ?RN Care Coordinator ? ? ?

## 2022-03-21 ENCOUNTER — Telehealth: Payer: Self-pay

## 2022-03-21 NOTE — Patient Outreach (Signed)
Care Coordination ? ?03/21/2022 ? ?[name] ?03-Oct-1956 ?[id number] ? ? ?Medicaid Managed Care  ? ?Unsuccessful Outreach Note ? ?03/21/2022 ?Name: ANTARIO YASUDA MRN: [id number] DOB: Aug 31, 1956 ? ?Referred by: [name], MD ?Reason for referral : High Risk Managed Medicaid (MM Social Work [employer]) ? ? ?An unsuccessful telephone outreach was attempted today. The patient was referred to the case management team for assistance with care management and care coordination.  ? ?Follow Up Plan: A HIPAA compliant phone message was left for the patient providing contact information and requesting a return call.  ? ?Gus Puma, BSW, MHA ?[facility]  ?High Risk Managed Medicaid Team  ?[phone]  ?

## 2022-03-21 NOTE — Patient Instructions (Signed)
Visit Information ? ?[name]  - as a part of your Medicaid benefit, you are eligible for care management and care coordination services at no cost or copay. I was unable to reach you by phone today but would be happy to help you with your health related needs. Please feel free to call me @[phone].  ?.  ?Katanya Schlie, BSW, MHA ?[facility]  [place]  ?High Risk Managed Medicaid Team  ?[phone]  ?

## 2022-03-25 ENCOUNTER — Other Ambulatory Visit: Payer: Self-pay | Admitting: Internal Medicine

## 2022-03-25 ENCOUNTER — Other Ambulatory Visit (HOSPITAL_COMMUNITY): Payer: Self-pay | Admitting: Cardiology

## 2022-03-25 DIAGNOSIS — E782 Mixed hyperlipidemia: Secondary | ICD-10-CM

## 2022-03-27 ENCOUNTER — Telehealth: Payer: Self-pay | Admitting: *Deleted

## 2022-03-27 NOTE — Telephone Encounter (Signed)
Requested medication (s) are due for refill today: no  Requested medication (s) are on the active medication list: yes  Last refill:  03/19/22 #90   Future visit scheduled: no  Notes to clinic:   med was reordered at hospital discharge Please review if ok to refill and send to mail order   Requested Prescriptions  Pending Prescriptions Disp Refills   gabapentin (NEURONTIN) 300 MG capsule [Pharmacy Med Name: gabapentin 300 mg capsule] 90 capsule 2    Sig: TAKE ONE CAPSULE BY MOUTH THREE TIMES DAILY     Neurology: Anticonvulsants - gabapentin Failed - 03/25/2022  8:03 AM      Failed - Cr in normal range and within 360 days    Creatinine, Ser  Date Value Ref Range Status  03/14/2022 1.45 (H) 0.61 - 1.24 mg/dL Final         Failed - Completed PHQ-2 or PHQ-9 in the last 360 days      Passed - Valid encounter within last 12 months    Recent Outpatient Visits           3 weeks ago Homeless   [facility] [place], Binnie Rail, MD   3 weeks ago No-show for appointment   [facility] Jonah Blue B, MD   4 months ago Type 2 diabetes mellitus with other circulatory complications [facility])   [facility] Jonah Blue B, MD   8 months ago Type 2 diabetes mellitus with other circulatory complications [facility])   [place] [facility] Jonah Blue B, MD   11 months ago Type 2 diabetes mellitus with other circulatory complications [facility])    [facility] [name], MD

## 2022-03-27 NOTE — Patient Instructions (Signed)
Visit Information  [name]  - as a part of your Medicaid benefit, you are eligible for care management and care coordination services at no cost or copay. I was unable to reach you by phone today but would be happy to help you with your health related needs. Please feel free to call me @ [phone].   A member of the Managed Medicaid care management team will reach out to you again over the next 14 days.   Orvilla Truett RN, BSN [place]  [facility] RN Care Coordinator   

## 2022-03-27 NOTE — Patient Outreach (Signed)
Care Coordination  03/27/2022  Scott Greer 1956-07-14 [id number]   Medicaid Managed Care   Unsuccessful Outreach Note  03/27/2022 Name: Scott Greer MRN: [id number] DOB: 1956/08/13  Referred by: [name], MD Reason for referral : High Risk Managed Medicaid (Unsuccessful RNCM telephone outreach)   A second unsuccessful telephone outreach was attempted today. The patient was referred to the case management team for assistance with care management and care coordination.   Follow Up Plan: A HIPAA compliant phone message was left for the patient providing contact information and requesting a return call.   Estanislado Emms RN, BSN [place]  [facility]

## 2022-03-28 ENCOUNTER — Ambulatory Visit: Payer: Self-pay | Admitting: *Deleted

## 2022-03-28 ENCOUNTER — Telehealth: Payer: Self-pay | Admitting: Internal Medicine

## 2022-03-28 NOTE — Telephone Encounter (Signed)
Call returned to patient # [phone], message left with call back requested to this CM

## 2022-03-28 NOTE — Telephone Encounter (Signed)
Summary: Legs are swollen very badly and sore   Pt stated Legs are swollen very badly and sore. 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 Pt mentioned his hands are very sore as well.   Please call pt at [phone]   Pt seeking clinical advice.     Attempted to call patient regarding symptoms- left message to call office.

## 2022-03-28 NOTE — Telephone Encounter (Signed)
Called [phone] and unable to leave message mailbox full.

## 2022-03-28 NOTE — Telephone Encounter (Signed)
Called patient using [phone] and mailbox full. Sent SMS message to call back #[phone].
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
## 2022-03-28 NOTE — Telephone Encounter (Signed)
2nd call attempted to contact patient to review sx. No answer. Lvmtcb [phone].

## 2022-03-29 NOTE — Telephone Encounter (Signed)
Called pt and no answer unable to leave vm
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
## 2022-04-04 ENCOUNTER — Other Ambulatory Visit (HOSPITAL_COMMUNITY): Payer: Self-pay | Admitting: Internal Medicine

## 2022-04-04 ENCOUNTER — Other Ambulatory Visit (HOSPITAL_COMMUNITY): Payer: Self-pay | Admitting: Cardiology

## 2022-04-05 DIAGNOSIS — Z419 Encounter for procedure for purposes other than remedying health state, unspecified: Secondary | ICD-10-CM | POA: Diagnosis not present

## 2022-04-09 ENCOUNTER — Telehealth: Payer: Self-pay | Admitting: *Deleted

## 2022-04-09 NOTE — Patient Outreach (Signed)
Care Coordination  04/09/2022  CAMRAN KEADY Oct 09, 1956 [id number]  RNCM attempting to reach patient to assist in arranging transportation to PCP office on 04/12/22. RNCM left detailed HIPAA compliant message including contact information. RNCM will attempt to reach patient again tomorrow.  Scott Emms RN, BSN [place]  [facility]

## 2022-04-10 ENCOUNTER — Other Ambulatory Visit: Payer: Self-pay | Admitting: *Deleted

## 2022-04-10 ENCOUNTER — Telehealth: Payer: Self-pay | Admitting: *Deleted

## 2022-04-10 NOTE — Patient Outreach (Signed)
Care Coordination  04/10/2022  DAMAREE WINTJEN 01-30-1956 [id number]  RNCM attempting to reach [name] to arrange transportation to his PCP appointment on 04/12/22. RNCM left a detailed HIPAA compliant message, including contact information, requesting a return call.  [name] RN, BSN [place]  [facility]

## 2022-04-10 NOTE — Telephone Encounter (Signed)
Call placed to patient [phone] to discuss transportation need for appointment at [facility] this week - 04/12/2022. Message left with call back requested to this CM

## 2022-04-10 NOTE — Patient Instructions (Addendum)
Visit Information  Mr. Frappier was given information about Medicaid Managed Care team care coordination services as a part of their [facility] Medicaid benefit. [name] verbally consented to engagement with the [facility] Managed Care team.   If you are experiencing a medical emergency, please call 911 or report to your local emergency department or urgent care.   If you have a non-emergency medical problem during routine business hours, please contact your provider's office and ask to speak with a nurse.   For questions related to your [name] health plan, please call: [phone] or go here:[email or web address]  If you would like to schedule transportation through your [facility] plan, please call the following number at least 2 days in advance of your appointment: [phone].  You can also use the MTM portal or MTM mobile app to manage your rides. For the portal, please go to [email or web address].  Call the [place] at [phone], at any time, 24 hours a day, 7 days a week. If you are in danger or need immediate medical attention call 911.  If you would like help to quit smoking, call 1-800-QUIT-NOW 435-405-9717) OR Espaol: 1-855-Djelo-Ya (8-325-498-2641) o para ms informacin haga clic aqu or Text READY to 200-400 to register via text  Mr. Goshorn,   Please see education materials related to living with an amputation provided as print materials.   The patient verbalized understanding of instructions, educational materials, and care plan provided today and agreed to receive a mailed copy of patient instructions, educational materials, and care plan.   Telephone follow up appointment with Managed Medicaid care management team member scheduled for:04/16/22 @ [place] RN, [place] RN Care Coordinator   Following is a copy of your plan of care:  Care Plan : RN Care Manager Plan of Care   Updates made by [name], RN since 04/10/2022 12:00 AM     Problem: Health Management needs related to DMII      Long-Range Goal: Development of Plan of Care to address Health Management needs related to DMII   Start Date: 02/27/2022  Expected End Date: 05/28/2022  Priority: High  Note:   Current Barriers:  Care Coordination needs related to Housing barriers  Chronic Disease Management support and education needs related to DMII, Homelessness, and Stress  Mr. Lauf returned call to [facility], he is now in emergency housing at [employer] for two weeks. He is working with [employer] with [employer] and [employer] to find a low income apartment.    RNCM Clinical Goal(s):  Patient will verbalize understanding of plan for management of DMII as evidenced by patient report continue to work with RN Care Manager and/or Social Worker to address care management and care coordination needs related to Homelessness and managing stress as evidenced by adherence to CM Team Scheduled appointments     through collaboration with RN Care manager, provider, and care team.   Interventions: Inter-disciplinary care team collaboration (see longitudinal plan of care) Evaluation of current treatment plan related to  self management and patient's adherence to plan as established by provider Updated patient chart with new temporary address Assisted with arranging transportation to PCP appointment on 04/12/22, pick up time 8:50am, return pick up 11am Discussed benefits of ALF, patient prefers independent living at this time Advised patient to take all medications to PCP appointment Advised patient to continue working with Disability Advocacy and [employer] for housing resources   Diabetes:  (  Status: Goal Not Met.) Long Term Goal   Lab Results  Component Value Date   HGBA1C 6.0 07/18/2021   @ Assessed patient's understanding of A1c goal: <7% Discussed plans with patient for ongoing  care management follow up and provided patient with direct contact information for care management team;      Referral made to social work team for assistance with managing stress and procuring housing ;      Assessed social determinant of health barriers;        Unable to focus on DM management as housing is patient's priority today  Patient Goals/Self-Care Activities: Attend all scheduled provider appointments Call provider office for new concerns or questions  Work with the social worker to address care coordination needs and will continue to work with the clinical team to address health care and disease management related needs Follow up with housing resources

## 2022-04-10 NOTE — Patient Outreach (Addendum)
Medicaid Managed Care   Nurse Care Manager Note  04/10/2022 Name:  Scott Greer MRN:  [id number] DOB:  Mar 03, 1956  Scott Greer is an 66 y.o. year old male who is a primary patient of [name], [name], MD.  The [facility] Managed Care Coordination team was consulted for assistance with:    DMII and homelessness  Scott Greer was given information about Medicaid Managed Care Coordination team services today. [name] Patient agreed to services and verbal consent obtained.  Engaged with patient by telephone for follow up visit in response to provider referral for case management and/or care coordination services.   Assessments/Interventions:  Review of past medical history, allergies, medications, health status, including review of consultants reports, laboratory and other test data, was performed as part of comprehensive evaluation and provision of chronic care management services.  SDOH (Social Determinants of Health) assessments and interventions performed:   Care Plan  Allergies  Allergen Reactions   Bee Venom Hives, Itching and Swelling    Medications Reviewed Today     Reviewed by [name], RN (Registered Nurse) on 03/14/22 at 1311  Med List Status: <None>   Medication Order Taking? Sig Documenting Provider Last Dose Status Informant  amiodarone (PACERONE) 200 MG tablet [id number] No Take 1 tablet (200 mg total) by mouth daily.  Patient not taking: Reported on 02/27/2022   [name], MD Not Taking Active   aspirin (ASPIRIN LOW DOSE) 81 MG chewable tablet [id number] No Chew 1 tablet (81 mg total) by mouth daily.  Patient not taking: Reported on 02/27/2022   [name], MD Not Taking Active   atorvastatin (LIPITOR) 10 MG tablet [id number] No Take 1 tablet (10 mg total) by mouth daily with supper.  Patient not taking: Reported on 12/01/2021   [name], MD Not Taking Active   BIDIL 20-37.5 MG tablet [id number] No TAKE ONE TABLET BY MOUTH THREE TIMES  DAILY Needs appointment for further refills  Patient not taking: Reported on 02/27/2022   [name], MD Not Taking Active   Blood Glucose Monitoring Suppl (TRUE METRIX METER) w/Device KIT [id number] No Use as directed  Patient not taking: Reported on 11/20/2021   [name], MD Not Taking Active Other  carvedilol (COREG) 3.125 MG tablet [id number] No TAKE ONE TABLET BY MOUTH EVERY MORNING and TAKE ONE TABLET BY MOUTH EVERY EVENING WITH A MEAL  Patient not taking: Reported on 02/27/2022   [name], MD Not Taking Active   digoxin (LANOXIN) 0.125 MG tablet [id number] No Take 1 tablet (0.125 mg total) by mouth daily with supper.  Patient not taking: Reported on 02/27/2022   [name], MD Not Taking Active   empagliflozin (JARDIANCE) 10 MG TABS tablet [id number] No Take 1 tablet (10 mg total) by mouth daily.  Patient not taking: Reported on 02/27/2022   [name], MD Not Taking Active   FEROSUL 325 [id number] Fe) MG tablet [id number] No TAKE ONE TABLET BY MOUTH EVERY MORNING WITH BREAKFAST  Patient not taking: Reported on 12/01/2021   [name], MD Not Taking Active   gabapentin (NEURONTIN) 300 MG capsule [id number] No TAKE ONE CAPSULE BY MOUTH THREE TIMES DAILY  Patient not taking: Reported on 02/27/2022   [name], MD Not Taking Active   glucose blood (TRUE METRIX BLOOD GLUCOSE TEST) test strip [id number] No Use as instructed  Patient not taking: Reported on 11/20/2021   [name], MD Not Taking  Active Other  metFORMIN (GLUCOPHAGE) 500 MG tablet [id number] No TAKE ONE TABLET BY MOUTH TWICE DAILY WITH A MEAL  Patient not taking: Reported on 03/14/2022   [name], MD Not Taking Active   nutrition supplement, [name]) PACK [id number] No Take 1 packet by mouth 2 (two) times daily between meals.  Patient not taking: Reported on 11/20/2021   Persons, [name], [name] Not Taking Active   oxyCODONE (ROXICODONE) 5 MG immediate release tablet  [id number] No Take 1 tablet (5 mg total) by mouth every 8 (eight) hours as needed for severe pain.  Patient not taking: Reported on 12/01/2021   [name], NP Not Taking Active   potassium chloride SA (KLOR-CON M) 20 MEQ tablet [id number] No Take 1 tablet (20 mEq total) by mouth daily. Absolute last refill without office visit please call [phone] to schedule  Patient not taking: Reported on 03/14/2022   [name], [name], MD Not Taking Active   sacubitril-valsartan (ENTRESTO) 24-26 MG [id number] No Take 1 tablet by mouth 2 (two) times daily. LAST REFILL NEEDS TO KEEP FOLLOW UP APPOINTMENT FOR ANYMORE REFILLS  Patient not taking: Reported on 02/27/2022   [name], MD Not Taking Active   spironolactone (ALDACTONE) 25 MG tablet [id number] No Take 1 tablet (25 mg total) by mouth every morning. Absolute last refill without office visit please call [phone] to schedule  Patient not taking: Reported on 03/14/2022   [name], [name], MD Not Taking Active   torsemide (DEMADEX) 20 MG tablet [id number] No Take 1 tablet (20 mg total) by mouth daily. Absolute last refill without office visit please call [phone] to schedule  Patient not taking: Reported on 03/14/2022   [name], [name], MD Not Taking Active   TRUEplus Lancets 28G MISC [id number] No Use as directed  Patient not taking: Reported on 11/20/2021   [name], MD Not Taking Active Other  Vitamin D, Ergocalciferol, (DRISDOL) 1.25 MG (50000 UNIT) CAPS capsule [id number] No TAKE ONE CAPSULE BY MOUTH ONCE WEEKLY ON SUNDAY  Patient not taking: Reported on 12/01/2021   [name], MD Not Taking Active   zinc sulfate 220 (50 Zn) MG capsule [id number] No Take 1 capsule (220 mg total) by mouth daily.  Patient not taking: Reported on 08/01/2021   Persons, [name], [name] Not Taking Active             Patient Active Problem List   Diagnosis Date Noted   Chronic combined systolic and diastolic CHF (congestive heart  failure) ([place]) 12/01/2021   S/P BKA (below knee amputation) bilateral ([place]) 07/18/2021   Acute hematogenous osteomyelitis of right foot ([place]) 04/26/2021   Gangrene of right foot ([place])    Foot pain, right 04/14/2021   Subacute osteomyelitis, right ankle and foot ([place])    Status post below-knee amputation ([place]) 02/17/2021   Wound dehiscence    Gangrene of toe of left foot ([place])    Cutaneous abscess of left foot    Left foot infection 02/07/2021   Leukocytosis 02/07/2021   Thrombocytosis 02/07/2021   Hyponatremia 02/07/2021   AKI (acute kidney injury) ([place]) 02/07/2021   Hyperglycemia due to diabetes mellitus ([place]) 02/07/2021   Hypoalbuminemia due to protein-calorie malnutrition ([place]) 02/07/2021   Osteomyelitis of second toe of left foot ([place])    New onset of congestive heart failure ([place]) 12/26/2020   CKD (chronic kidney disease) stage 3, GFR 30-59 ml/min ([facility]) 03/50/0938   Acute systolic CHF (congestive heart failure) ([place]) 12/26/2020  Anemia, chronic disease 06/22/2020   Amputee, great toe, right ([place]) 06/20/2020   Normocytic anemia 06/20/2020   Erectile dysfunction associated with type 2 diabetes mellitus ([place]) 06/20/2020   Positive for macroalbuminuria 10/24/2019   Amputation of left great toe ([place]) 10/23/2019   Tobacco dependence 10/23/2019   Osteomyelitis of great toe of right foot ([place]) 06/04/2019   Diabetic foot infection ([place])    Noncompliance    Glaucoma    Hyperlipidemia    Essential hypertension 11/06/2003   Diabetes mellitus ([place]) 11/05/1997    Conditions to be addressed/monitored per PCP order:  DMII and Homelessness  Care Plan : RN Care Manager Plan of Care  Updates made by [name], RN since 04/10/2022 12:00 AM     Problem: Health Management needs related to DMII      Long-Range Goal: Development of Plan of Care to address Health Management needs related to DMII   Start Date: 02/27/2022  Expected End Date: 05/28/2022  Priority: High  Note:   Current  Barriers:  Care Coordination needs related to Housing barriers  Chronic Disease Management support and education needs related to DMII, Homelessness, and Stress  Scott Greer returned call to [facility], he is now in emergency housing at [employer] for two weeks. He is working with [employer] with [employer] and [employer] to find a low income apartment.    RNCM Clinical Goal(s):  Patient will verbalize understanding of plan for management of DMII as evidenced by patient report continue to work with RN Care Manager and/or Social Worker to address care management and care coordination needs related to Homelessness and managing stress as evidenced by adherence to CM Team Scheduled appointments     through collaboration with RN Care manager, provider, and care team.   Interventions: Inter-disciplinary care team collaboration (see longitudinal plan of care) Evaluation of current treatment plan related to  self management and patient's adherence to plan as established by provider Updated patient chart with new temporary address Assisted with arranging transportation to PCP appointment on 04/12/22, pick up time 8:50am, return pick up 11am Discussed benefits of ALF, patient prefers independent living at this time Advised patient to take all medications to PCP appointment Advised patient to continue working with [employer] and [employer] for housing resources   Diabetes:  (Status: Goal Not Met.) Long Term Goal   Lab Results  Component Value Date   HGBA1C 6.0 07/18/2021   @ Assessed patient's understanding of A1c goal: <7% Discussed plans with patient for ongoing care management follow up and provided patient with direct contact information for care management team;      Referral made to social work team for assistance with managing stress and procuring housing ;      Assessed social determinant of health barriers;        Unable to focus on DM management as housing is  patient's priority today  Patient Goals/Self-Care Activities: Attend all scheduled provider appointments Call provider office for new concerns or questions  Work with the social worker to address care coordination needs and will continue to work with the clinical team to address health care and disease management related needs Follow up with housing resources       Follow Up:  Patient agrees to Care Plan and Follow-up.  Plan: The Managed Medicaid care management team will reach out to the patient again over the next 7 days.  Date/time of next scheduled RN care management/care coordination outreach:  04/16/22 @  9am  [name] RN, BSN [place]  [employer] manager

## 2022-04-12 ENCOUNTER — Ambulatory Visit: Payer: Medicaid Other | Admitting: Internal Medicine

## 2022-04-12 NOTE — Telephone Encounter (Signed)
Transportation to patient's appointment today was arranged by Estanislado Emms, RN/THN and patient was a no show.

## 2022-04-13 DIAGNOSIS — F320 Major depressive disorder, single episode, mild: Secondary | ICD-10-CM | POA: Diagnosis not present

## 2022-04-16 ENCOUNTER — Other Ambulatory Visit: Payer: Self-pay | Admitting: *Deleted
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
## 2022-04-18 ENCOUNTER — Other Ambulatory Visit: Payer: Self-pay | Admitting: Internal Medicine

## 2022-04-18 DIAGNOSIS — E782 Mixed hyperlipidemia: Secondary | ICD-10-CM

## 2022-04-18 MED ORDER — CARVEDILOL 3.125 MG PO TABS: ORAL_TABLET | ORAL | 1 refills | Status: DC

## 2022-04-18 MED ORDER — EMPAGLIFLOZIN 10 MG PO TABS: 10.0000 mg | ORAL_TABLET | Freq: Every day | ORAL | 0 refills | Status: DC

## 2022-04-18 MED ORDER — TORSEMIDE 20 MG PO TABS: 20.0000 mg | ORAL_TABLET | Freq: Every day | ORAL | 1 refills | Status: DC

## 2022-04-18 MED ORDER — DIGOXIN 125 MCG PO TABS: 0.1250 mg | ORAL_TABLET | Freq: Every day | ORAL | 1 refills | Status: DC

## 2022-04-18 MED ORDER — SPIRONOLACTONE 25 MG PO TABS: ORAL_TABLET | ORAL | 1 refills | Status: DC

## 2022-04-18 MED ORDER — ENTRESTO 24-26 MG PO TABS: 1.0000 | ORAL_TABLET | Freq: Two times a day (BID) | ORAL | 1 refills | Status: DC

## 2022-04-18 MED ORDER — AMIODARONE HCL 200 MG PO TABS: 200.0000 mg | ORAL_TABLET | Freq: Every day | ORAL | 0 refills | Status: DC

## 2022-04-18 MED ORDER — ATORVASTATIN CALCIUM 10 MG PO TABS: 10.0000 mg | ORAL_TABLET | Freq: Every day | ORAL | 1 refills | Status: DC

## 2022-04-18 MED ORDER — ISOSORB DINITRATE-HYDRALAZINE 20-37.5 MG PO TABS: ORAL_TABLET | ORAL | 1 refills | Status: DC

## 2022-04-20 DIAGNOSIS — F320 Major depressive disorder, single episode, mild: Secondary | ICD-10-CM | POA: Diagnosis not present

## 2022-04-24 ENCOUNTER — Other Ambulatory Visit: Payer: Self-pay | Admitting: *Deleted

## 2022-04-24 NOTE — Patient Instructions (Signed)
Visit Information  [name]  - as a part of your Medicaid benefit, you are eligible for care management and care coordination services at no cost or copay. I was unable to reach you by phone today but would be happy to help you with your health related needs. Please feel free to call me @ [phone].   A member of the Managed Medicaid care management team will reach out to you again over the next 14 days.   Mkayla Steele RN, BSN [place]  [facility] RN Care Coordinator   

## 2022-04-24 NOTE — Patient Outreach (Signed)
Care Coordination  04/24/2022  Scott Greer 04/14/1956 [id number]   Medicaid Managed Care   Unsuccessful Outreach Note  04/24/2022 Name: Scott Greer MRN: [id number] DOB: [birth year]  Referred by: Vesta Mixer, MD Reason for referral : High Risk Managed Medicaid (Unsuccessful RNCM follow up outreach)   An unsuccessful telephone outreach was attempted today. The patient was referred to the case management team for assistance with care management and care coordination.   Follow Up Plan: A HIPAA compliant phone message was left for the patient providing contact information and requesting a return call.   Estanislado Emms RN, BSN   [employer]

## 2022-04-26 ENCOUNTER — Ambulatory Visit: Payer: Medicaid Other | Admitting: Family

## 2022-04-26 DIAGNOSIS — F320 Major depressive disorder, single episode, mild: Secondary | ICD-10-CM | POA: Diagnosis not present

## 2022-04-30 ENCOUNTER — Other Ambulatory Visit: Payer: Self-pay | Admitting: *Deleted

## 2022-05-02 DIAGNOSIS — F320 Major depressive disorder, single episode, mild: Secondary | ICD-10-CM | POA: Diagnosis not present

## 2022-05-04 ENCOUNTER — Other Ambulatory Visit: Payer: Self-pay | Admitting: *Deleted

## 2022-05-04 NOTE — Patient Instructions (Signed)
Visit Information  [name] was given information about Medicaid Managed Care team care coordination services as a part of their [facility] Medicaid benefit. Scott Greer verbally consented to engagement with the [facility] Managed Care team.   If you are experiencing a medical emergency, please call 911 or report to your local emergency department or urgent care.   If you have a non-emergency medical problem during routine business hours, please contact your provider's office and ask to speak with a nurse.   For questions related to your [facility] health plan, please call: [phone] or go here:[email or web address]  If you would like to schedule transportation through your [facility] plan, please call the following number at least 2 days in advance of your appointment: [phone].  You can also use the MTM portal or MTM mobile app to manage your rides. For the portal, please go to mtm.[email or web address].cy.  Call the [place] at [phone], at any time, 24 hours a day, 7 days a week. If you are in danger or need immediate medical attention call 911.  If you would like help to quit smoking, call 1-800-QUIT-NOW 567-279-9474) OR Espaol: 1-855-Djelo-Ya (1-443-154-0086) o para ms informacin haga clic aqu or Text READY to 200-400 to register via text  Scott Greer,   Please see education materials related to HF, skin care and Diabetes provided as print materials.   The patient verbalized understanding of instructions, educational materials, and care plan provided today and agreed to receive a mailed copy of patient instructions, educational materials, and care plan.   Telephone follow up appointment with Managed Medicaid care management team member scheduled for:06/05/22 @ 11:15am  Scott Joiner RN, [place] RN Care Coordinator [phone]   Following is a copy of your plan of care:  Care Plan : RN Care Manager  Plan of Care  Updates made by [name], RN since 05/04/2022 12:00 AM     Problem: Health Management needs related to DMII      Long-Range Goal: Development of Plan of Care to address Health Management needs related to DMII   Start Date: 02/27/2022  Expected End Date: 07/05/2022  Priority: High  Note:   Current Barriers:  Care Coordination needs related to Housing barriers  Chronic Disease Management support and education needs related to DMII, Homelessness, and Stress  Scott Greer is staying at the [place] until 05/09/22. He hopes [employer] will extend his stay for 2 more weeks. He continues to work with Scott Greer and Scott Greer at [employer] for permanent housing. He is only taking one of his medications on some days, he does not have any of his other medications and has not picked up medication refills sent by Dr. Wynetta Emery.  RNCM Clinical Goal(s):  Patient will verbalize understanding of plan for management of DMII as evidenced by patient report continue to work with RN Care Manager and/or Social Worker to address care management and care coordination needs related to Homelessness and managing stress as evidenced by adherence to CM Team Scheduled appointments     through collaboration with RN Care manager, provider, and care team.   Interventions: Inter-disciplinary care team collaboration (see longitudinal plan of care) Evaluation of current treatment plan related to  self management and patient's adherence to plan as established by provider [place], requested patient medications to be delivered-medications should be delivered on Monday 05/07/22 Advised patient to take all medications to PCP appointment Advised patient to continue working with  Disability Advocacy and [employer] for housing resources Provided therapeutic listening Advised patient to contact RNCM July 5th for his location, [facility] will arrange transportation to PCP appointment on July 11 at  2:50pm   Diabetes:  (Status: Goal Not Met.) Long Term Goal   Lab Results  Component Value Date   HGBA1C 6.0 07/18/2021   @ Assessed patient's understanding of A1c goal: <7% Discussed plans with patient for ongoing care management follow up and provided patient with direct contact information for care management team;      Referral made to social work team for assistance with managing stress and procuring housing ;      Assessed social determinant of health barriers;        Unable to focus on DM management as housing is patient's priority today  Patient Goals/Self-Care Activities: Attend all scheduled provider appointments Call provider office for new concerns or questions  Work with the social worker to address care coordination needs and will continue to work with the clinical team to address health care and disease management related needs Follow up with housing resources

## 2022-05-04 NOTE — Patient Outreach (Signed)
Medicaid Managed Care   Nurse Care Manager Note  05/04/2022 Name:  Scott Greer MRN:  [id number] DOB:  1956-06-27  Scott Greer is an 66 y.o. year old male who is a primary patient of [name], [name], MD.  The Medicaid Managed Care Coordination team was consulted for assistance with:    DMII Homelessness  [name] was given information about Medicaid Managed Care Coordination team services today. Scott Greer Patient agreed to services and verbal consent obtained.  Engaged with patient by telephone for follow up visit in response to provider referral for case management and/or care coordination services.   Assessments/Interventions:  Review of past medical history, allergies, medications, health status, including review of consultants reports, laboratory and other test data, was performed as part of comprehensive evaluation and provision of chronic care management services.  SDOH (Social Determinants of Health) assessments and interventions performed:   Care Plan  Allergies  Allergen Reactions   Bee Venom Hives, Itching and Swelling    Medications Reviewed Today     Reviewed by [name], RN (Registered Nurse) on 05/04/22 at 1349  Med List Status: <None>   Medication Order Taking? Sig Documenting Provider Last Dose Status Informant  Accu-Chek Softclix Lancets lancets [id number] No Use to test blood sugars up to 4 times daily as directed.  Patient not taking: Reported on 04/16/2022   [name], MD Not Taking Active   amiodarone (PACERONE) 200 MG tablet [id number] No Take 1 tablet (200 mg total) by mouth daily.  Patient not taking: Reported on 05/04/2022   [name], MD Not Taking Active   aspirin (ASPIRIN LOW DOSE) 81 MG chewable tablet [id number] No Chew 1 tablet (81 mg total) by mouth daily.  Patient not taking: Reported on 04/16/2022   [name], MD Not Taking Active   atorvastatin (LIPITOR) 10 MG tablet [id number] No Take 1 tablet (10 mg total)  by mouth daily with supper.  Patient not taking: Reported on 05/04/2022   [name], MD Not Taking Active   Blood Glucose Monitoring Suppl (ACCU-CHEK GUIDE) w/Device KIT [id number] No Use to test blood sugars up to 4 times daily as directed.  Patient not taking: Reported on 04/16/2022   [name], MD Not Taking Active   Blood Glucose Monitoring Suppl (TRUE METRIX METER) w/Device KIT [id number] No Use as directed  Patient not taking: Reported on 11/20/2021   [name], MD Not Taking Active Other  carvedilol (COREG) 3.125 MG tablet [id number] No TAKE ONE TABLET BY MOUTH EVERY MORNING and TAKE ONE TABLET BY MOUTH EVERY EVENING WITH A MEAL  Patient not taking: Reported on 05/04/2022   [name], MD Not Taking Active   digoxin (LANOXIN) 0.125 MG tablet [id number] No Take 1 tablet (0.125 mg total) by mouth daily with supper.  Patient not taking: Reported on 05/04/2022   [name], MD Not Taking Active   empagliflozin (JARDIANCE) 10 MG TABS tablet [id number] No Take 1 tablet (10 mg total) by mouth daily.  Patient not taking: Reported on 05/04/2022   [name], MD Not Taking Active   ferrous sulfate (FEROSUL) 325 (65 FE) MG tablet [id number] No TAKE ONE TABLET BY MOUTH EVERY MORNING WITH BREAKFAST  Patient not taking: Reported on 04/16/2022   [name], MD Not Taking Active   gabapentin (NEURONTIN) 300 MG capsule [id number] No TAKE ONE CAPSULE BY MOUTH THREE TIMES DAILY  Patient not taking: Reported on 04/16/2022   Karle Plumber  B, MD Not Taking Active   glucose blood (ACCU-CHEK GUIDE) test strip [id number] No Use to test blood sugars up to 4 times daily as directed.  Patient not taking: Reported on 04/16/2022   [name], MD Not Taking Active   isosorbide-hydrALAZINE (BIDIL) 20-37.5 MG tablet [id number] No TAKE ONE TABLET BY MOUTH THREE TIMES DAILY Needs appointment for further refills  Patient not taking: Reported on 05/04/2022   [name], MD Not Taking Active   metFORMIN (GLUCOPHAGE) 500 MG tablet [id number] Yes Take 1 tablet (500 mg total) by mouth 2 (two) times daily with a meal. [name], MD Taking Active            Med Note (Maison Agrusa A   Fri May 04, 2022  1:18 PM) "Taking every now and then"  nutrition supplement, [name]) PACK [id number] No Take 1 packet by mouth 2 (two) times daily between meals.  Patient not taking: Reported on 11/20/2021   Persons, Scott Greer, [name] Not Taking Active   oxyCODONE (ROXICODONE) 5 MG immediate release tablet [id number] No Take 1 tablet (5 mg total) by mouth every 8 (eight) hours as needed for severe pain.  Patient not taking: Reported on 12/01/2021   [name], NP Not Taking Active   potassium chloride SA (KLOR-CON M) 20 MEQ tablet [id number] No Take 1 tablet (20 mEq total) by mouth daily. Absolute last refill without office visit please call [phone] to schedule  Patient not taking: Reported on 03/14/2022   [name], [name], MD Not Taking Active   sacubitril-valsartan (ENTRESTO) 24-26 MG [id number] No Take 1 tablet by mouth 2 (two) times daily. LAST REFILL NEEDS TO KEEP FOLLOW UP APPOINTMENT FOR ANYMORE REFILLS  Patient not taking: Reported on 05/04/2022   [name], MD Not Taking Active   spironolactone (ALDACTONE) 25 MG tablet [id number] No TAKE ONE TABLET BY MOUTH EVERY MORNING Absolute last refill without office visit please call [phone] TO schedule  Patient not taking: Reported on 05/04/2022   [name], MD Not Taking Active   torsemide (DEMADEX) 20 MG tablet [id number] No Take 1 tablet (20 mg total) by mouth daily. Absolute last refill without office visit please call [phone] to schedule  Patient not taking: Reported on 05/04/2022   [name], MD Not Taking Active   Vitamin D, Ergocalciferol, (DRISDOL) 1.25 MG (50000 UNIT) CAPS capsule [id number] No TAKE ONE CAPSULE BY MOUTH ONCE WEEKLY ON SUNDAY  Patient not taking: Reported on  04/16/2022   [name], MD Not Taking Active   zinc sulfate 220 (50 Zn) MG capsule [id number] No Take 1 capsule (220 mg total) by mouth daily.  Patient not taking: Reported on 08/01/2021   Persons, Scott Greer, [name] Not Taking Active             Patient Active Problem List   Diagnosis Date Noted   Chronic combined systolic and diastolic CHF (congestive heart failure) ([place]) 12/01/2021   S/P BKA (below knee amputation) bilateral ([place]) 07/18/2021   Acute hematogenous osteomyelitis of right foot ([place]) 04/26/2021   Gangrene of right foot ([place])    Foot pain, right 04/14/2021   Subacute osteomyelitis, right ankle and foot ([place])    Status post below-knee amputation ([place]) 02/17/2021   Wound dehiscence    Gangrene of toe of left foot ([place])    Cutaneous abscess of left foot    Left foot infection 02/07/2021   Leukocytosis 02/07/2021   Thrombocytosis 02/07/2021   Hyponatremia  02/07/2021   AKI (acute kidney injury) ([place]) 02/07/2021   Hyperglycemia due to diabetes mellitus ([place]) 02/07/2021   Hypoalbuminemia due to protein-calorie malnutrition ([place]) 02/07/2021   Osteomyelitis of second toe of left foot ([place])    New onset of congestive heart failure ([place]) 12/26/2020   CKD (chronic kidney disease) stage 3, GFR 30-59 ml/min (HCC) 75/17/0017   Acute systolic CHF (congestive heart failure) ([place]) 12/26/2020   Anemia, chronic disease 06/22/2020   Amputee, great toe, right ([place]) 06/20/2020   Normocytic anemia 06/20/2020   Erectile dysfunction associated with type 2 diabetes mellitus ([place]) 06/20/2020   Positive for macroalbuminuria 10/24/2019   Amputation of left great toe ([place]) 10/23/2019   Tobacco dependence 10/23/2019   Osteomyelitis of great toe of right foot ([place]) 06/04/2019   Diabetic foot infection ([place])    Noncompliance    Glaucoma    Hyperlipidemia    Essential hypertension 11/06/2003   Diabetes mellitus ([place]) 11/05/1997    Conditions to be addressed/monitored per PCP order:   DMII  Care Plan : RN Care Manager Plan of Care  Updates made by [name], RN since 05/04/2022 12:00 AM     Problem: Health Management needs related to DMII      Long-Range Goal: Development of Plan of Care to address Health Management needs related to DMII   Start Date: 02/27/2022  Expected End Date: 07/05/2022  Priority: High  Note:   Current Barriers:  Care Coordination needs related to Housing barriers  Chronic Disease Management support and education needs related to DMII, Homelessness, and Stress  Mr. Netter is staying at the [facility] until 05/09/22. He hopes [employer] will extend his stay for 2 more weeks. He continues to work with Marya Amsler and Event organiser at [employer] for permanent housing. He is only taking one of his medications on some days, he does not have any of his other medications and has not picked up medication refills sent by Dr. Wynetta Emery.  RNCM Clinical Goal(s):  Patient will verbalize understanding of plan for management of DMII as evidenced by patient report continue to work with RN Care Manager and/or Social Worker to address care management and care coordination needs related to Homelessness and managing stress as evidenced by adherence to CM Team Scheduled appointments     through collaboration with RN Care manager, provider, and care team.   Interventions: Inter-disciplinary care team collaboration (see longitudinal plan of care) Evaluation of current treatment plan related to  self management and patient's adherence to plan as established by provider [place], requested patient medications to be delivered-medications should be delivered on Monday 05/07/22 Advised patient to take all medications to PCP appointment Advised patient to continue working with Disability Advocacy and [employer] for housing resources Provided therapeutic listening Advised patient to contact RNCM July 5th for his location, [facility] will arrange  transportation to PCP appointment on July 11 at 2:50pm   Diabetes:  (Status: Goal Not Met.) Long Term Goal   Lab Results  Component Value Date   HGBA1C 6.0 07/18/2021   @ Assessed patient's understanding of A1c goal: <7% Discussed plans with patient for ongoing care management follow up and provided patient with direct contact information for care management team;      Referral made to social work team for assistance with managing stress and procuring housing ;      Assessed social determinant of health barriers;        Unable to focus on DM management as  housing is patient's priority today  Patient Goals/Self-Care Activities: Attend all scheduled provider appointments Call provider office for new concerns or questions  Work with the social worker to address care coordination needs and will continue to work with the clinical team to address health care and disease management related needs Follow up with housing resources       Follow Up:  Patient agrees to Care Plan and Follow-up.  Plan: The Managed Medicaid care management team will reach out to the patient again over the next 30 days.  Date/time of next scheduled RN care management/care coordination outreach:  06/05/22 @ 11:15am  [name] RN, [place] RN Care Coordinator

## 2022-05-05 DIAGNOSIS — Z419 Encounter for procedure for purposes other than remedying health state, unspecified: Secondary | ICD-10-CM | POA: Diagnosis not present

## 2022-05-09 ENCOUNTER — Telehealth: Payer: Self-pay

## 2022-05-09 ENCOUNTER — Other Ambulatory Visit: Payer: Self-pay

## 2022-05-09 DIAGNOSIS — F320 Major depressive disorder, single episode, mild: Secondary | ICD-10-CM | POA: Diagnosis not present

## 2022-05-09 NOTE — Telephone Encounter (Signed)
ENTRESTO PA APPROVED UNTIL 05/09/23, [facility] AWARE.

## 2022-05-10 ENCOUNTER — Telehealth: Payer: Self-pay | Admitting: *Deleted

## 2022-05-10 NOTE — Patient Outreach (Signed)
Care Coordination  05/10/2022  LEMAN MARTINEK 27-May-1956 [id number]  Unsuccessful telephone outreach. RNCM attempting to reach patient to assist with arranging transportation to PCP appointment.  Estanislado Emms RN, BSN [place]  [employer]

## 2022-05-10 NOTE — Patient Outreach (Signed)
Care Coordination  05/10/2022  DIEGO ULBRICHT 04-16-1956 [id number]  RNCM attempting to reach Mr. Veras to assist with arranging transportation to his PCP appointment on 05/15/22. A detailed HIPAA compliant message was left, including RNCM contact information.  Estanislado Emms RN, BSN [place]  [employer]

## 2022-05-11 ENCOUNTER — Other Ambulatory Visit: Payer: Self-pay

## 2022-05-14 ENCOUNTER — Telehealth: Payer: Self-pay | Admitting: *Deleted

## 2022-05-14 NOTE — Patient Outreach (Signed)
Care Coordination  05/14/2022  Scott Greer 1956/06/07 [id number]  RNCM received call from [name] stating that his stay at [facility] has been extended through 05/21/22(paid for by [employer]). RNCM explained to [name] that transportation had to be requested with his insurance plan at least 2-3 days prior to his appointment. His appointment with Dr. Laural Benes is tomorrow 05/15/22 @ 2:50pm. RNCM discussed other possibilities for transportation with patient. [name] will ask his ex-wife to take him to the appointment on 05/15/22. RNCM will follow up with [name] at the next scheduled telephone visit on 06/05/22.  Estanislado Emms RN, BSN [place]  [employer]

## 2022-05-15 ENCOUNTER — Ambulatory Visit: Payer: Medicaid Other | Admitting: Internal Medicine

## 2022-05-15 ENCOUNTER — Telehealth: Payer: Self-pay

## 2022-05-15 NOTE — Telephone Encounter (Signed)
Call placed to patient # [phone] to inquire if he has arranged transportation to his appointment at [facility] this afternoon. Message left with call back requested.   If he has not arranged a ride and can get into a cab, this clinic will schedule a cab ride for him,

## 2022-05-15 NOTE — Telephone Encounter (Signed)
I tried contacting patient again # [phone] to inquire if he has arranged transportation to his appointment at [facility] this afternoon. Message left with call back requested.    If he has not arranged a ride and can get into a cab, this clinic will schedule a cab ride for him. His appointment is at 1450 today

## 2022-05-16 ENCOUNTER — Telehealth: Payer: Self-pay | Admitting: Cardiovascular Disease

## 2022-05-16 DIAGNOSIS — F320 Major depressive disorder, single episode, mild: Secondary | ICD-10-CM | POA: Diagnosis not present

## 2022-05-16 NOTE — Telephone Encounter (Signed)
Copied from CRM [id number]. Topic: General - Other >> May 14, 2022  2:30 PM De [place] wrote: Reason for CRM: Christianne Dolin nurse from [facility].   Stated pt was discharged for personal care services because they have been unable to contact pt .  No contact information or address. Last contact was in the hospital.  Message is for [name], MD

## 2022-05-17 NOTE — Telephone Encounter (Signed)
Call received from [facility]. She said that they have never provided PCS for the patient because they have never been able to reach him.

## 2022-05-17 NOTE — Telephone Encounter (Signed)
Call returned to [facility] # [phone] to inquire if patient ever received personal care services. Message left with call back requested.

## 2022-05-23 DIAGNOSIS — F320 Major depressive disorder, single episode, mild: Secondary | ICD-10-CM | POA: Diagnosis not present

## 2022-05-28 ENCOUNTER — Other Ambulatory Visit: Payer: Self-pay | Admitting: Internal Medicine

## 2022-05-29 ENCOUNTER — Other Ambulatory Visit: Payer: Self-pay | Admitting: Internal Medicine

## 2022-05-29 DIAGNOSIS — F320 Major depressive disorder, single episode, mild: Secondary | ICD-10-CM | POA: Diagnosis not present

## 2022-05-29 NOTE — Telephone Encounter (Signed)
Requested medication (s) are due for refill today: yes  Requested medication (s) are on the active medication list: yes  Last refill:  04/18/22 #30  Future visit scheduled: yes  Notes to clinic:  overdue lab work- med. Not delegated to NT to RF   Requested Prescriptions  Pending Prescriptions Disp Refills   amiodarone (PACERONE) 200 MG tablet [Pharmacy Med Name: amiodarone 200 mg tablet] 30 tablet 0    Sig: TAKE ONE TABLET BY MOUTH AT NOON     Not Delegated - Cardiovascular: Antiarrhythmic Agents - amiodarone Failed - 05/28/2022  9:57 AM      Failed - This refill cannot be delegated      Failed - Manual Review: Eye exam recommended every 12 months      Failed - TSH in normal range and within 360 days    No results found for: "TSH", "POCTSH", "[place]"       Failed - Mg Level in normal range and within 360 days    Magnesium  Date Value Ref Range Status  02/07/2021 1.8 1.7 - 2.4 mg/dL Final    Comment:    Performed at [facility], [place] [address], [place], [place] [address]         Failed - Patient had ECG in the last 180 days      Failed - Last BP in normal range    BP Readings from Last 1 Encounters:  03/20/22 (!) 165/113         Failed - Patient had chest x-ray within the last 6 months      Passed - K in normal range and within 180 days    Potassium  Date Value Ref Range Status  03/14/2022 3.5 3.5 - 5.1 mmol/L Final         Passed - AST in normal range and within 180 days    AST  Date Value Ref Range Status  02/20/2022 15 15 - 41 U/L Final         Passed - ALT in normal range and within 180 days    ALT  Date Value Ref Range Status  02/20/2022 10 0 - 44 U/L Final         Passed - Patient is not pregnant      Passed - Last Heart Rate in normal range    Pulse Readings from Last 1 Encounters:  03/20/22 70         Passed - Valid encounter within last 6 months    Recent Outpatient Visits           2 months ago [place], Dalbert Batman, MD   2 months ago No-show for appointment   [place] Karle Plumber B, MD   6 months ago Type 2 diabetes mellitus with other circulatory complications [facility])   [place] Karle Plumber B, MD   10 months ago Type 2 diabetes mellitus with other circulatory complications [facility])   [place], Deborah B, MD   1 year ago Type 2 diabetes mellitus with other circulatory complications [facility])   [place], MD       Future Appointments             In 2 months Wynetta Emery Dalbert Batman, MD [place]  JARDIANCE 10 MG TABS tablet [Pharmacy Med Name: Jardiance 10 mg tablet] 30 tablet 0    Sig: TAKE ONE TABLET BY MOUTH AT [place]     Endocrinology:  Diabetes - SGLT2 Inhibitors Failed - 05/28/2022  9:57 AM      Failed - Cr in normal range and within 360 days    Creatinine, Ser  Date Value Ref Range Status  03/14/2022 1.45 (H) 0.61 - 1.24 mg/dL Final         Failed - HBA1C is between 0 and 7.9 and within 180 days    HbA1c, POC (controlled diabetic range)  Date Value Ref Range Status  07/18/2021 6.0 0.0 - 7.0 % Final         Failed - eGFR in normal range and within 360 days    GFR calc Af Amer  Date Value Ref Range Status  12/14/2020 57 (L) >59 mL/min/1.73 Final    Comment:    **In accordance with recommendations from the NKF-ASN Task force,**   [facility] is in the process of updating its eGFR calculation to the   2021 CKD-EPI creatinine equation that estimates kidney function   without a race variable.    GFR, Estimated  Date Value Ref Range Status  03/14/2022 54 (L) >60 mL/min Final    Comment:    (NOTE) Calculated using the CKD-EPI Creatinine Equation (2021)    eGFR  Date Value Ref Range Status  07/18/2021 70 >59 mL/min/1.73 Final         Passed -  Valid encounter within last 6 months    Recent Outpatient Visits           2 months ago [place] [name], MD   2 months ago No-show for appointment   [place] Karle Plumber B, MD   6 months ago Type 2 diabetes mellitus with other circulatory complications [facility])   [place] Karle Plumber B, MD   10 months ago Type 2 diabetes mellitus with other circulatory complications [facility])   [place], Deborah B, MD   1 year ago Type 2 diabetes mellitus with other circulatory complications [facility])   [place], MD       Future Appointments             In 2 months Wynetta Emery Dalbert Batman, MD [place]

## 2022-05-31 ENCOUNTER — Other Ambulatory Visit: Payer: Self-pay | Admitting: Internal Medicine

## 2022-06-01 ENCOUNTER — Other Ambulatory Visit: Payer: Self-pay | Admitting: Cardiology

## 2022-06-05 ENCOUNTER — Ambulatory Visit: Payer: Self-pay

## 2022-06-05 DIAGNOSIS — Z419 Encounter for procedure for purposes other than remedying health state, unspecified: Secondary | ICD-10-CM | POA: Diagnosis not present

## 2022-06-06 DIAGNOSIS — F320 Major depressive disorder, single episode, mild: Secondary | ICD-10-CM | POA: Diagnosis not present

## 2022-06-11 DIAGNOSIS — F320 Major depressive disorder, single episode, mild: Secondary | ICD-10-CM | POA: Diagnosis not present

## 2022-06-19 ENCOUNTER — Other Ambulatory Visit: Payer: Self-pay

## 2022-06-19 ENCOUNTER — Emergency Department (HOSPITAL_BASED_OUTPATIENT_CLINIC_OR_DEPARTMENT_OTHER)
Admission: EM | Admit: 2022-06-19 | Discharge: 2022-06-19 | Disposition: A | Discharge status: Home / Self Care | Payer: Medicaid Other | Attending: Emergency Medicine | Admitting: Emergency Medicine

## 2022-06-19 ENCOUNTER — Encounter (HOSPITAL_BASED_OUTPATIENT_CLINIC_OR_DEPARTMENT_OTHER): Payer: Self-pay | Admitting: Emergency Medicine

## 2022-06-19 DIAGNOSIS — M79604 Pain in right leg: Secondary | ICD-10-CM | POA: Diagnosis not present

## 2022-06-19 DIAGNOSIS — Z743 Need for continuous supervision: Secondary | ICD-10-CM | POA: Diagnosis not present

## 2022-06-19 DIAGNOSIS — Z7984 Long term (current) use of oral hypoglycemic drugs: Secondary | ICD-10-CM | POA: Diagnosis not present

## 2022-06-19 DIAGNOSIS — Z5900 Homelessness unspecified: Secondary | ICD-10-CM | POA: Insufficient documentation

## 2022-06-19 DIAGNOSIS — I130 Hypertensive heart and chronic kidney disease with heart failure and stage 1 through stage 4 chronic kidney disease, or unspecified chronic kidney disease: Secondary | ICD-10-CM | POA: Diagnosis not present

## 2022-06-19 DIAGNOSIS — S8991XA Unspecified injury of right lower leg, initial encounter: Secondary | ICD-10-CM | POA: Diagnosis present

## 2022-06-19 DIAGNOSIS — F320 Major depressive disorder, single episode, mild: Secondary | ICD-10-CM | POA: Diagnosis not present

## 2022-06-19 DIAGNOSIS — E1022 Type 1 diabetes mellitus with diabetic chronic kidney disease: Secondary | ICD-10-CM | POA: Diagnosis not present

## 2022-06-19 DIAGNOSIS — Z89511 Acquired absence of right leg below knee: Secondary | ICD-10-CM | POA: Diagnosis not present

## 2022-06-19 DIAGNOSIS — W050XXA Fall from non-moving wheelchair, initial encounter: Secondary | ICD-10-CM | POA: Insufficient documentation

## 2022-06-19 DIAGNOSIS — S80811A Abrasion, right lower leg, initial encounter: Secondary | ICD-10-CM | POA: Insufficient documentation

## 2022-06-19 DIAGNOSIS — I509 Heart failure, unspecified: Secondary | ICD-10-CM | POA: Insufficient documentation

## 2022-06-19 DIAGNOSIS — W19XXXA Unspecified fall, initial encounter: Secondary | ICD-10-CM

## 2022-06-19 DIAGNOSIS — Z89512 Acquired absence of left leg below knee: Secondary | ICD-10-CM | POA: Diagnosis not present

## 2022-06-19 DIAGNOSIS — Z7982 Long term (current) use of aspirin: Secondary | ICD-10-CM | POA: Diagnosis not present

## 2022-06-19 DIAGNOSIS — N189 Chronic kidney disease, unspecified: Secondary | ICD-10-CM | POA: Diagnosis not present

## 2022-06-19 NOTE — ED Provider Notes (Signed)
I saw and evaluated the patient, reviewed the resident's note and I agree with the findings and plan.   66 year old male who presents had mechanical fall from wheelchair.  No LOC.  Complains of abrasion to his lower extremity.  On exam his prior amputation sites are without evidence of ecchymosis.  Small abrasion noted.  Full range of motion in his knees.  No indication for imaging at this time and will discharge   Lorre Nick, MD 06/19/22 [phone]

## 2022-06-19 NOTE — ED Triage Notes (Signed)
Pt arrives to ED via [facility] with c/o cut to right amputated leg. Pt reports he fell out of his wheelchair yesterday. Pt is a bilateral lower leg amputee.

## 2022-06-19 NOTE — ED Provider Notes (Signed)
[facility] EMERGENCY DEPT Provider Note   CSN: [id number] Arrival date & time: 06/19/22  1156     History  Chief Complaint  Patient presents with   Elnora Morrison is a 66 y.o. male with history of CHF, CKD, HLD, T1DM, HTN, b/l BKA.  He is presenting after a fall. Patient reports that he fell out of his wheelchair yesterday and sustained a cut to his right leg. He has bilateral BKAs but only has wounds on his right stump.  He notes that he has limited soreness in his times where he fell but has no other wounds other than a small abrasion on his right leg.  He denies any fevers, difficulty moving the area or signs of infection.   He is also having financial difficulties and has now run out of money and is no longer able to stay at the motel that he was initially staying at.  Patient is currently hungry and needing a bath to get cleaned up.  He has no other concerns at this time.   Fall Pertinent negatives include no chest pain, no abdominal pain and no shortness of breath.       Home Medications Prior to Admission medications   Medication Sig Start Date End Date Taking? Authorizing Provider  amiodarone (PACERONE) 200 MG tablet Take 1 tablet (200 mg total) by mouth daily. 06/07/22   [name], MD  aspirin (ASPIRIN LOW DOSE) 81 MG chewable tablet Chew 1 tablet (81 mg total) by mouth daily. Patient not taking: Reported on 04/16/2022 03/19/22   [name], MD  atorvastatin (LIPITOR) 10 MG tablet Take 1 tablet (10 mg total) by mouth daily with supper. Patient not taking: Reported on 05/04/2022 04/18/22   [name], MD  Blood Glucose Monitoring Suppl (ACCU-CHEK GUIDE) w/Device KIT Use to test blood sugars up to 4 times daily as directed. Patient not taking: Reported on 04/16/2022 03/19/22   [name], MD  Blood Glucose Monitoring Suppl (TRUE METRIX METER) w/Device KIT Use as directed Patient not taking: Reported on 11/20/2021 10/23/19   [name], MD  carvedilol (COREG) 3.125 MG tablet TAKE ONE TABLET BY MOUTH EVERY MORNING and TAKE ONE TABLET BY MOUTH EVERY EVENING WITH A MEAL Patient not taking: Reported on 05/04/2022 04/18/22   [name], MD  digoxin (LANOXIN) 0.125 MG tablet Take 1 tablet (0.125 mg total) by mouth daily with supper. Patient not taking: Reported on 05/04/2022 04/18/22   [name], MD  empagliflozin (JARDIANCE) 10 MG TABS tablet TAKE ONE TABLET BY MOUTH AT NOON 05/29/22   [name], MD  ferrous sulfate (FEROSUL) 325 (65 FE) MG tablet TAKE ONE TABLET BY MOUTH EVERY MORNING WITH BREAKFAST Patient not taking: Reported on 04/16/2022 03/19/22   [name], MD  gabapentin (NEURONTIN) 300 MG capsule TAKE ONE CAPSULE BY MOUTH THREE TIMES DAILY Patient not taking: Reported on 04/16/2022 03/27/22   [name], MD  glucose blood (ACCU-CHEK GUIDE) test strip Use to test blood sugars up to 4 times daily as directed. Patient not taking: Reported on 04/16/2022 03/19/22   [name], MD  isosorbide-hydrALAZINE (BIDIL) 20-37.5 MG tablet TAKE ONE TABLET BY MOUTH THREE TIMES DAILY Needs appointment for further refills Patient not taking: Reported on 05/04/2022 04/18/22   [name], MD  metFORMIN (GLUCOPHAGE) 500 MG tablet Take 1 tablet (500 mg total) by mouth 2 (two) times daily with a meal. 03/19/22   [name],  MD  nutrition supplement, JUVEN, (JUVEN) PACK Take 1 packet by mouth 2 (two) times daily between meals. Patient not taking: Reported on 11/20/2021 04/17/21   Persons, [name], [name]  oxyCODONE (ROXICODONE) 5 MG immediate release tablet Take 1 tablet (5 mg total) by mouth every 8 (eight) hours as needed for severe pain. Patient not taking: Reported on 12/01/2021 11/07/21   [name], NP  potassium chloride SA (KLOR-CON M) 20 MEQ tablet Take 1 tablet (20 mEq total) by mouth daily. Absolute last refill without office visit please call [phone] to schedule Patient not taking:  Reported on 03/14/2022 03/06/22   [name], [name], MD  sacubitril-valsartan (ENTRESTO) 24-26 MG Take 1 tablet by mouth 2 (two) times daily. Absolute last refill without office visit please call [phone] to schedule 06/07/22   [name], MD  spironolactone (ALDACTONE) 25 MG tablet TAKE ONE TABLET BY MOUTH EVERY MORNING Absolute last refill without office visit please call [phone] TO schedule Patient not taking: Reported on 05/04/2022 04/18/22   [name], MD  torsemide (DEMADEX) 20 MG tablet Take 1 tablet (20 mg total) by mouth daily. Absolute last refill without office visit please call [phone] to schedule Patient not taking: Reported on 05/04/2022 04/18/22   [name], MD  Accu-Chek Softclix Lancets lancets Use to test blood sugars up to 4 times daily as directed. Patient not taking: Reported on 04/16/2022 03/19/22   [name], MD  Vitamin D, Ergocalciferol, (DRISDOL) 1.25 MG (50000 UNIT) CAPS capsule TAKE ONE CAPSULE BY MOUTH ONCE WEEKLY ON SUNDAY Patient not taking: Reported on 04/16/2022 03/19/22   [name], MD  zinc sulfate 220 (50 Zn) MG capsule Take 1 capsule (220 mg total) by mouth daily. Patient not taking: Reported on 08/01/2021 04/17/21   Persons, [name], [name]      Allergies    Bee venom    Review of Systems   Review of Systems  Constitutional:  Negative for activity change, appetite change, chills and diaphoresis.  HENT:  Negative for congestion and trouble swallowing.   Eyes:  Negative for visual disturbance.  Respiratory:  Negative for shortness of breath and wheezing.   Cardiovascular:  Negative for chest pain and palpitations.  Gastrointestinal:  Negative for abdominal distention and abdominal pain.  Genitourinary:  Negative for difficulty urinating and urgency.  Musculoskeletal:  Negative for arthralgias and joint swelling.       Mild soreness of stumps where abrasion is  Neurological:  Negative for dizziness, tremors and  weakness.    Physical Exam Updated Vital Signs BP (!) 147/93 (BP Location: Left Arm)   Pulse 69   Temp 97.7 F (36.5 C) (Temporal)   Resp 18   SpO2 100%  Physical Exam Constitutional:      General: He is not in acute distress.    Appearance: Normal appearance. He is not ill-appearing.     Comments: Appears somewhat disheveled  HENT:     Head: Normocephalic and atraumatic.     Nose: Nose normal.     Mouth/Throat:     Mouth: Mucous membranes are moist.  Eyes:     Extraocular Movements: Extraocular movements intact.     Conjunctiva/sclera: Conjunctivae normal.  Cardiovascular:     Rate and Rhythm: Normal rate and regular rhythm.  Pulmonary:     Effort: Pulmonary effort is normal.     Breath sounds: Normal breath sounds.  Abdominal:     General: Abdomen is flat. Bowel sounds are normal.  Palpations: Abdomen is soft.  Musculoskeletal:        General: Normal range of motion.     Cervical back: Normal range of motion and neck supple.  Skin:    General: Skin is warm and dry.     Comments: Small 1 cm abrasion noted on right stump, no bleeding or signs of infection.  Neurological:     General: No focal deficit present.     Mental Status: He is alert.     ED Results / Procedures / Treatments   Labs (all labs ordered are listed, but only abnormal results are displayed) Labs Reviewed - No data to display  EKG None  Radiology No results found.  Procedures Procedures   Medications Ordered in ED Medications - No data to display  ED Course/ Medical Decision Making/ A&P                           Medical Decision Making  SHAKAI DOLLEY is a 66 y.o. male with history of CHF, CKD, HLD, T1DM, HTN, b/l BKA.  He is presenting after a fall from his wheelchair yesterday with subsequent soreness in his stumps.  Patient with minor abrasion on the right stump but no signs of infection and no further work-up necessary at this time.  Patient is not having significant pain in the  area and does not warrant x-rays. Patient does note that he is having financial hardship and that he does not have any more money for where he is living.  He does not have any further concerns at this time and needs no further work-up.  Discussed with patient to keep the abrasion clean and strict return precautions were given.   Final Clinical Impression(s) / ED Diagnoses Final diagnoses:  Fall, initial encounter  Homeless    Rx / DC Orders ED Discharge Orders     None         Rise Patience, DO 06/19/22 1404    Lacretia Leigh, MD 06/22/22 1313

## 2022-06-19 NOTE — Discharge Instructions (Signed)
Please return if your wound gets worse, you are not having any healing of the area or cannot control the pain.

## 2022-06-20 ENCOUNTER — Telehealth: Payer: Self-pay | Admitting: *Deleted

## 2022-06-20 ENCOUNTER — Other Ambulatory Visit: Payer: Self-pay | Admitting: Internal Medicine

## 2022-06-20 DIAGNOSIS — E782 Mixed hyperlipidemia: Secondary | ICD-10-CM

## 2022-06-20 NOTE — Patient Outreach (Signed)
Care Coordination  06/20/2022  Scott Greer 06/24/1956 [id number]  Transition Care Management Unsuccessful Follow-up Telephone Call  Date of discharge and from where:  06/19/22, [facility]  Attempts:  1st Attempt  Reason for unsuccessful TCM follow-up call:  Unable to leave message  Estanislado Emms RN, BSN [place]  [facility] RN Care Coordinator

## 2022-06-21 ENCOUNTER — Telehealth: Payer: Self-pay

## 2022-06-21 NOTE — Patient Outreach (Signed)
Care Coordination  06/21/2022  FAHIM KATS [birth year] [id number]  Transition Care Management Unsuccessful Follow-up Telephone Call  Date of discharge and from where:  [facility] 06/19/22  Attempts:  2nd Attempt  Reason for unsuccessful TCM follow-up call:  Unable to reach patient   Gus Puma, BSW, [place] [facility]  [facility]  High Risk Managed Medicaid Team  [phone]

## 2022-06-26 ENCOUNTER — Other Ambulatory Visit: Payer: Self-pay | Admitting: *Deleted

## 2022-06-26 NOTE — Patient Outreach (Signed)
Medicaid Managed Care   Nurse Care Manager Note  06/26/2022 Name:  Scott Greer MRN:  [id number] DOB:  [birth year]  JEFFREE CAZEAU is an 66 y.o. year old male who is a primary patient of [name], [name], MD.  The Medicaid Managed Care Coordination team was consulted for assistance with:    DMII  Mr. Elsayed was given information about Medicaid Managed Care Coordination team services today. [name] Patient agreed to services and verbal consent obtained.  Engaged with patient by telephone for follow up visit in response to provider referral for case management and/or care coordination services.   Assessments/Interventions:  Review of past medical history, allergies, medications, health status, including review of consultants reports, laboratory and other test data, was performed as part of comprehensive evaluation and provision of chronic care management services.  SDOH (Social Determinants of Health) assessments and interventions performed: SDOH Interventions    Flowsheet Row Most Recent Value  SDOH Interventions   Housing Interventions Other (Comment)  [Patient is currently homeless. RNCM collaborated with [employer]. [name] will follow up this afternoon after contacting [facility]. [name] with Social Services with APS is now involved]       Care Plan  Allergies  Allergen Reactions   Bee Venom Hives, Itching and Swelling    Medications Reviewed Today     Reviewed by [name], RN (Registered Nurse) on 06/26/22 at 83  Med List Status: <None>   Medication Order Taking? Sig Documenting Provider Last Dose Status Informant  Accu-Chek Softclix Lancets lancets [id number] No Use to test blood sugars up to 4 times daily as directed.  Patient not taking: Reported on 04/16/2022   [name], MD Not Taking Active   amiodarone (PACERONE) 200 MG tablet [id number]  Take 1 tablet (200 mg total) by mouth daily. [name], MD   Active   aspirin (ASPIRIN LOW DOSE) 81 MG chewable tablet [id number] No Chew 1 tablet (81 mg total) by mouth daily.  Patient not taking: Reported on 04/16/2022   [name], MD Not Taking Active   atorvastatin (LIPITOR) 10 MG tablet [id number]  TAKE ONE TABLET BY MOUTH EVERY Nadene Rubins, MD  Active   Blood Glucose Monitoring Suppl (ACCU-CHEK GUIDE) w/Device KIT [id number] No Use to test blood sugars up to 4 times daily as directed.  Patient not taking: Reported on 04/16/2022   [name], MD Not Taking Active   Blood Glucose Monitoring Suppl (TRUE METRIX METER) w/Device KIT [id number] No Use as directed  Patient not taking: Reported on 11/20/2021   [name], MD Not Taking Active Other  carvedilol (COREG) 3.125 MG tablet [id number]  TAKE ONE TABLET BY MOUTH EVERY MORNING and TAKE ONE TABLET BY MOUTH EVERY EVENING WITH A MEAL [name], MD  Active   digoxin (LANOXIN) 0.125 MG tablet [id number]  TAKE ONE TABLET BY MOUTH EVERY EVENING WITH SUPPER [name], MD  Active   empagliflozin (JARDIANCE) 10 MG TABS tablet [id number]  TAKE ONE TABLET BY MOUTH AT Gregary Cromer, MD  Active   FEROSUL 325 (65 Fe) MG tablet [id number]  TAKE ONE TABLET BY MOUTH EVERY MORNING WITH BREAKFAST [name], MD  Active   gabapentin (NEURONTIN) 300 MG capsule [id number] No TAKE ONE CAPSULE BY MOUTH THREE TIMES DAILY  Patient not taking: Reported on 04/16/2022   [name], MD Not Taking Active   glucose blood (ACCU-CHEK GUIDE) test strip [id number] No  Use to test blood sugars up to 4 times daily as directed.  Patient not taking: Reported on 04/16/2022   [name], MD Not Taking Active   isosorbide-hydrALAZINE (BIDIL) 20-37.5 MG tablet [id number]  TAKE ONE TABLET BY MOUTH THREE TIMES DAILY Needs appointment for further refills [name], MD  Active   metFORMIN (GLUCOPHAGE) 500 MG tablet [id number] No Take 1 tablet (500 mg total) by mouth 2 (two)  times daily with a meal. [name], MD Taking Active            Med Note (Kylei Purington A   Fri May 04, 2022  1:18 PM) "Taking every now and then"  nutrition supplement, [name]) PACK [id number] No Take 1 packet by mouth 2 (two) times daily between meals.  Patient not taking: Reported on 11/20/2021   Persons, [name], [name] Not Taking Active   oxyCODONE (ROXICODONE) 5 MG immediate release tablet [id number] No Take 1 tablet (5 mg total) by mouth every 8 (eight) hours as needed for severe pain.  Patient not taking: Reported on 12/01/2021   [name], NP Not Taking Active   potassium chloride SA (KLOR-CON M) 20 MEQ tablet [id number] No Take 1 tablet (20 mEq total) by mouth daily. Absolute last refill without office visit please call [phone] to schedule  Patient not taking: Reported on 03/14/2022   [name], [name], MD Not Taking Active   sacubitril-valsartan (ENTRESTO) 24-26 MG [id number]  Take 1 tablet by mouth 2 (two) times daily. Absolute last refill without office visit please call [phone] to schedule [name], MD  Active   spironolactone (ALDACTONE) 25 MG tablet [id number]  TAKE ONE TABLET BY MOUTH EVERY MORNING Needs appointment for further refills [name], MD  Active   torsemide (DEMADEX) 20 MG tablet [id number]  TAKE ONE TABLET BY MOUTH ONCE DAILY Needs appointment for further refills [name], MD  Active   Vitamin D, Ergocalciferol, (DRISDOL) 1.25 MG (50000 UNIT) CAPS capsule [id number] No TAKE ONE CAPSULE BY MOUTH ONCE WEEKLY ON SUNDAY  Patient not taking: Reported on 04/16/2022   [name], MD Not Taking Active   zinc sulfate 220 (50 Zn) MG capsule [id number] No Take 1 capsule (220 mg total) by mouth daily.  Patient not taking: Reported on 08/01/2021   Persons, [name], [name] Not Taking Active             Patient Active Problem List   Diagnosis Date Noted   Chronic combined systolic and diastolic CHF (congestive heart failure)  ([place]) 12/01/2021   S/P BKA (below knee amputation) bilateral ([place]) 07/18/2021   Acute hematogenous osteomyelitis of right foot ([place]) 04/26/2021   Gangrene of right foot ([place])    Foot pain, right 04/14/2021   Subacute osteomyelitis, right ankle and foot ([place])    Status post below-knee amputation ([place]) 02/17/2021   Wound dehiscence    Gangrene of toe of left foot ([place])    Cutaneous abscess of left foot    Left foot infection 02/07/2021   Leukocytosis 02/07/2021   Thrombocytosis 02/07/2021   Hyponatremia 02/07/2021   AKI (acute kidney injury) ([place]) 02/07/2021   Hyperglycemia due to diabetes mellitus ([place]) 02/07/2021   Hypoalbuminemia due to protein-calorie malnutrition ([place]) 02/07/2021   Osteomyelitis of second toe of left foot ([place])    New onset of congestive heart failure ([place]) 12/26/2020   CKD (chronic kidney disease) stage 3, GFR 30-59 ml/min (HCC) 62/94/7654   Acute systolic CHF (congestive heart failure) (  [place]) 12/26/2020   Anemia, chronic disease 06/22/2020   Amputee, great toe, right ([place]) 06/20/2020   Normocytic anemia 06/20/2020   Erectile dysfunction associated with type 2 diabetes mellitus ([place]) 06/20/2020   Positive for macroalbuminuria 10/24/2019   Amputation of left great toe ([place]) 10/23/2019   Tobacco dependence 10/23/2019   Osteomyelitis of great toe of right foot ([place]) 06/04/2019   Diabetic foot infection ([place])    Noncompliance    Glaucoma    Hyperlipidemia    Essential hypertension 11/06/2003   Diabetes mellitus ([place]) 11/05/1997    Conditions to be addressed/monitored per PCP order:  DMII  Care Plan : RN Care Manager Plan of Care  Updates made by [name], RN since 06/26/2022 12:00 AM     Problem: Health Management needs related to DMII      Long-Range Goal: Development of Plan of Care to address Health Management needs related to DMII   Start Date: 02/27/2022  Expected End Date: 09/04/2022  Priority: High  Note:   Current Barriers:  Care  Coordination needs related to Housing barriers  Chronic Disease Management support and education needs related to DMII, Homelessness, and Stress  Mr. Hartog is now on the streets. He was going to have to pay $65/day to continue living at the hotel and is unable to afford the cost. He has misplaced all of his medications and is unable to focus on his health, while worrying about housing. His phone is cracked and he has lost all contacts. He reports someone from [employer] found him yesterday and will be helping him with housing.  RNCM Clinical Goal(s):  Patient will verbalize understanding of plan for management of DMII as evidenced by patient report continue to work with RN Care Manager and/or Social Worker to address care management and care coordination needs related to Homelessness and managing stress as evidenced by adherence to CM Team Scheduled appointments     through collaboration with RN Care manager, provider, and care team.   Interventions: Inter-disciplinary care team collaboration (see longitudinal plan of care) Evaluation of current treatment plan related to  self management and patient's adherence to plan as established by provider Collaborated with multiple members of Mr. Yannuzzi Care Team for updates on housing Conference call with Mr. Borgwardt and [name](filling in for [employer]) with [facility] [phone], Rubin Payor is aware of Mr. Dehaas, an APS report was placed yesterday and Mr. [name] with Social Services [phone] is working with Mr. Umland for housing BSW will reach out to Patient on 06/28/22 Provided therapeutic listening Advised patient to stay hydrated and keep his skin clean and free from injury   Diabetes:  (Status: Goal Not Met.) Long Term Goal   Lab Results  Component Value Date   HGBA1C 6.0 07/18/2021   @ Assessed patient's understanding of A1c goal: <7% Discussed plans with patient for ongoing care management follow up and  provided patient with direct contact information for care management team;      Referral made to social work team for assistance with managing stress and procuring housing ;      Assessed social determinant of health barriers;        Unable to focus on DM management as housing is patient's priority today  Patient Goals/Self-Care Activities: Attend all scheduled provider appointments Call provider office for new concerns or questions  Work with the social worker to address care coordination needs and will continue to work with the clinical team to address health  care and disease management related needs Follow up with housing resources       Follow Up:  Patient agrees to Care Plan and Follow-up.  Plan: The Managed Medicaid care management team will reach out to the patient again over the next 7 days.  Date/time of next scheduled RN care management/care coordination outreach:  07/02/22 @ 12:30pm  [name] RN, [place] RN Care Coordinator

## 2022-06-26 NOTE — Patient Instructions (Signed)
Visit Information  Scott Greer was given information about Medicaid Managed Care team care coordination services as a part of their [facility] Medicaid benefit. Scott Greer verbally consented to engagement with the [facility] Managed Care team.   If you are experiencing a medical emergency, please call 911 or report to your local emergency department or urgent care.   If you have a non-emergency medical problem during routine business hours, please contact your provider's office and ask to speak with a nurse.   For questions related to your [facility] health plan, please call: [phone] or go here:[email or web address]  If you would like to schedule transportation through your [facility] plan, please call the following number at least 2 days in advance of your appointment: [phone].  You can also use the MTM portal or MTM mobile app to manage your rides. For the portal, please go to [email or web address].  Call the [place] at [phone], at any time, 24 hours a day, 7 days a week. If you are in danger or need immediate medical attention call 911.  If you would like help to quit smoking, call 1-800-QUIT-NOW 209-454-2970) OR Espaol: 1-855-Djelo-Ya (6-269-485-4627) o para ms informacin haga clic aqu or Text READY to 200-400 to register via text  Scott Greer,   Please see education materials related to diabetes provided  unable to deliver via [place] or mail  Unable to deliver via [place] or mail. Patient is homeless with no address currently  Telephone follow up appointment with Managed Medicaid care management team member scheduled for:07/02/22 @ 12:30pm  [name] RN, BSN [place] RN Care Coordinator   Following is a copy of your plan of care:  Care Plan : RN Care Manager Plan of Care  Updates made by [name], RN since 06/26/2022 12:00 AM     Problem: Health Management needs related to DMII       Long-Range Goal: Development of Plan of Care to address Health Management needs related to DMII   Start Date: 02/27/2022  Expected End Date: 09/04/2022  Priority: High  Note:   Current Barriers:  Care Coordination needs related to Housing barriers  Chronic Disease Management support and education needs related to DMII, Homelessness, and Stress  Scott Greer is now on the streets. He was going to have to pay $65/day to continue living at the hotel and is unable to afford the cost. He has misplaced all of his medications and is unable to focus on his health, while worrying about housing. His phone is cracked and he has lost all contacts. He reports someone from [employer] found him yesterday and will be helping him with housing.  RNCM Clinical Goal(s):  Patient will verbalize understanding of plan for management of DMII as evidenced by patient report continue to work with RN Care Manager and/or Social Worker to address care management and care coordination needs related to Homelessness and managing stress as evidenced by adherence to CM Team Scheduled appointments     through collaboration with RN Care manager, provider, and care team.   Interventions: Inter-disciplinary care team collaboration (see longitudinal plan of care) Evaluation of current treatment plan related to  self management and patient's adherence to plan as established by provider Collaborated with multiple members of Scott Greer Care Team for updates on housing Conference call with Scott Greer and Scott Greer(filling in for [employer]) with [facility] [phone], Scott Greer is aware of Scott Greer, an APS report was placed  yesterday and Scott Greer with Social Services [phone] is working with Scott Greer for housing BSW will reach out to Patient on 06/28/22 Provided therapeutic listening Advised patient to stay hydrated and keep his skin clean and free from injury   Diabetes:  (Status: Goal Not  Met.) Long Term Goal   Lab Results  Component Value Date   HGBA1C 6.0 07/18/2021   @ Assessed patient's understanding of A1c goal: <7% Discussed plans with patient for ongoing care management follow up and provided patient with direct contact information for care management team;      Referral made to social work team for assistance with managing stress and procuring housing ;      Assessed social determinant of health barriers;        Unable to focus on DM management as housing is patient's priority today  Patient Goals/Self-Care Activities: Attend all scheduled provider appointments Call provider office for new concerns or questions  Work with the social worker to address care coordination needs and will continue to work with the clinical team to address health care and disease management related needs Follow up with housing resources

## 2022-06-27 DIAGNOSIS — F320 Major depressive disorder, single episode, mild: Secondary | ICD-10-CM | POA: Diagnosis not present

## 2022-06-28 ENCOUNTER — Other Ambulatory Visit: Payer: Self-pay

## 2022-06-28 NOTE — Patient Instructions (Signed)
Visit Information  Scott Greer was given information about Medicaid Managed Care team care coordination services as a part of their [facility] Medicaid benefit. Scott Greer verbally consented to engagement with the [facility] Managed Care team.   If you are experiencing a medical emergency, please call 911 or report to your local emergency department or urgent care.   If you have a non-emergency medical problem during routine business hours, please contact your provider's office and ask to speak with a nurse.   For questions related to your [facility] health plan, please call: [phone] or go here:[email or web address]  If you would like to schedule transportation through your [facility] plan, please call the following number at least 2 days in advance of your appointment: [phone].  You can also use the MTM portal or MTM mobile app to manage your rides. For the portal, please go to [email or web address]  Call the [facility] Crisis Line at [phone], at any time, 24 hours a day, 7 days a week. If you are in danger or need immediate medical attention call 911.  If you would like help to quit smoking, call 1-800-QUIT-NOW (617-799-9374) OR Espaol: 1-855-Djelo-Ya (5-956-387-5643) o para ms informacin haga clic aqu or Text READY to 329-518 to register via text  Scott Greer - following are the goals we discussed in your visit today:   Goals Addressed   None      Social Worker will follow up in 14 days .   Scott Greer, BSW, [facility]  [place]  High Risk Managed Medicaid Team  [phone]   Following is a copy of your plan of care:  Care Plan : LCSW Plan of Care  Updates made by Shaune Leeks since 06/28/2022 12:00 AM     Problem: Depression Identification (Depression)      Long-Range Goal: Depressive Symptoms Identified   Start Date: 05/30/2021  Recent Progress: On track  Priority: High  Note:   Timeframe:   Long-Range Goal Priority:  High Start Date:  05/30/21                        Expected End Date:  ongoing                    Current Barriers:  Acute Mental Health needs related to stress related to recent right BKA Financial constraints related to housing, Limited social support, ADL IADL limitations, Mental Health Concerns , and Social Isolation Suicidal Ideation/Homicidal Ideation: No  Clinical Social Work Goal(s):  Over the next 120 days, patient will work with SW by telephone to reduce or manage symptoms related to stress patient will work with [facility] BSW to address needs related to financial strain patient will demonstrate improved health management independence as evidenced by increasing self-care and implementing healthy coping skills to reeducate stress  Interventions: Patient interviewed and appropriate assessments performed: brief mental health assessment 66 year old male patient with a recent right BKA is having difficulty adjusting to his new way of life and experiencing financial strain. Patient will be referred to [facility] BSW for community resource support on 05/30/21. 06/19/21 and 08/28/21-Patient still denies wanting mental health resource implementation at this time. He reports that he may consider mental health support once he is able to find and settle in a new residence as he is still in need of housing.  Patient has a strong support network which includes his ex spouse, CNA and neighbors. Patient reports  that one of his neighbors' sons will assist him with various task like running errands or taking out his trash which is helpful to him. Patient's CNA comes daily for 2 hours in the morning and his ex spouse checks on him daily as well. Positive reinforcement provided for his current health care management  Patient was educated on available mental health resources but patient declined needing this implementation at this time and will notify [facility] LCSW if this changes. Patient is two  months behind in rent at this time and is in need of community resource support. [facility] LCSW will update [facility] BSW on this financial concerns on 06/19/21 Patient is on the wait list for [employer] Based Voucher program.  Patient was reminded to return missed call from [facility] Pharmacist today on 08/28/21 SDOH Interventions    Flowsheet Row Most Recent Value  SDOH Interventions   SDOH Interventions for the Following Domains Depression, Financial Strain, Housing, Stress  Financial Strain Interventions Other (Comment)  [Referral to [facility] BSW]  Housing Interventions Intervention Not Indicated  [Referral to [facility] BSW]  Stress Interventions Provide Counseling  Depression Interventions/Treatment  Patient refuses Treatment      Patient interviewed and appropriate assessments performed Provided patient with information about available mental health resources and healthy coping skills to help reduce stressors Discussed plans with patient for ongoing care management follow up and provided patient with direct contact information for care management team Solution-Focused Strategies, Active listening / Reflection utilized , Emotional Supportive Provided, and Participation in counseling encouraged  06/28/22: BSW completed a telephone outreach with patient, he stated he was at the [employer] and would be there for awhile. He was able to catch the bus to [facility] for his foodstamps and was able to recertify. He should check his card in 24-48 hours. Patient is currently working with [facility] and they will be meeting him on Monday, BSW reminded him of the appointment.  Patient Self Care Activities:  Self administers medications as prescribed Attends all scheduled provider appointments Calls provider office for new concerns or questions Ability for insight Independent living Motivation for treatment Strong family or social support  Patient Coping Strengths:  Supportive  Relationships Friends Hopefulness Self Advocate Able to Communicate Effectively  Patient Self Care Deficits:  Lacks social connections  Please see past updates related to this goal by clicking on the "Past Updates" button in the selected goal

## 2022-06-28 NOTE — Patient Outreach (Signed)
Medicaid Managed Care Social Work Note  06/28/2022 Name:  Scott Greer MRN:  [id number] DOB:  Jan 07, 1956  Scott Greer is an 66 y.o. year old male who is a primary patient of [name], [name], MD.  The Medicaid Managed Care Coordination team was consulted for assistance with:   housing   Scott Greer was given information about Medicaid Managed Care Coordination team services today. Scott Greer Patient agreed to services and verbal consent obtained.  Engaged with patient  for by telephone forfollow up visit in response to referral for case management and/or care coordination services.   Assessments/Interventions:  Review of past medical history, allergies, medications, health status, including review of consultants reports, laboratory and other test data, was performed as part of comprehensive evaluation and provision of chronic care management services.  SDOH: (Social Determinant of Health) assessments and interventions performed: 06/28/22: BSW completed a telephone outreach with patient, he stated he was at the [employer] and would be there for awhile. He was able to catch the bus to [employer] for his foodstamps and was able to recertify. He should check his card in 24-48 hours. Patient is currently working with [facility] and they will be meeting him on Monday, BSW reminded him of the appointment.  Advanced Directives Status:  Not addressed in this encounter.  Care Plan                 Allergies  Allergen Reactions   Bee Venom Hives, Itching and Swelling    Medications Reviewed Today     Reviewed by [name], RN (Registered Nurse) on 06/26/22 at 55  Med List Status: <None>   Medication Order Taking? Sig Documenting Provider Last Dose Status Informant  Accu-Chek Softclix Lancets lancets [id number] No Use to test blood sugars up to 4 times daily as directed.  Patient not taking: Reported on 04/16/2022   [name], MD Not Taking Active    amiodarone (PACERONE) 200 MG tablet [id number]  Take 1 tablet (200 mg total) by mouth daily. [name], MD  Active   aspirin (ASPIRIN LOW DOSE) 81 MG chewable tablet [id number] No Chew 1 tablet (81 mg total) by mouth daily.  Patient not taking: Reported on 04/16/2022   [name], MD Not Taking Active   atorvastatin (LIPITOR) 10 MG tablet [id number]  TAKE ONE TABLET BY MOUTH EVERY Nadene Rubins, MD  Active   Blood Glucose Monitoring Suppl (ACCU-CHEK GUIDE) w/Device KIT [id number] No Use to test blood sugars up to 4 times daily as directed.  Patient not taking: Reported on 04/16/2022   [name], MD Not Taking Active   Blood Glucose Monitoring Suppl (TRUE METRIX METER) w/Device KIT [id number] No Use as directed  Patient not taking: Reported on 11/20/2021   [name], MD Not Taking Active Other  carvedilol (COREG) 3.125 MG tablet [id number]  TAKE ONE TABLET BY MOUTH EVERY MORNING and TAKE ONE TABLET BY MOUTH EVERY EVENING WITH A MEAL [name], MD  Active   digoxin (LANOXIN) 0.125 MG tablet [id number]  TAKE ONE TABLET BY MOUTH EVERY EVENING WITH SUPPER [name], MD  Active   empagliflozin (JARDIANCE) 10 MG TABS tablet [id number]  TAKE ONE TABLET BY MOUTH AT Gregary Cromer, MD  Active   FEROSUL 325 (65 Fe) MG tablet [id number]  TAKE ONE TABLET BY MOUTH EVERY MORNING WITH BREAKFAST [name], MD  Active   gabapentin (NEURONTIN) 300 MG capsule  [id number] No TAKE ONE CAPSULE BY MOUTH THREE TIMES DAILY  Patient not taking: Reported on 04/16/2022   [name], MD Not Taking Active   glucose blood (ACCU-CHEK GUIDE) test strip [id number] No Use to test blood sugars up to 4 times daily as directed.  Patient not taking: Reported on 04/16/2022   [name], MD Not Taking Active   isosorbide-hydrALAZINE (BIDIL) 20-37.5 MG tablet [id number]  TAKE ONE TABLET BY MOUTH THREE TIMES DAILY Needs appointment for further refills [name], MD  Active   metFORMIN (GLUCOPHAGE) 500 MG tablet [id number] No Take 1 tablet (500 mg total) by mouth 2 (two) times daily with a meal. [name], MD Taking Active            Med Note (ROBB, MELANIE A   Fri May 04, 2022  1:18 PM) "Taking every now and then"  nutrition supplement, [name]) PACK [id number] No Take 1 packet by mouth 2 (two) times daily between meals.  Patient not taking: Reported on 11/20/2021   Persons, [name], [name] Not Taking Active   oxyCODONE (ROXICODONE) 5 MG immediate release tablet [id number] No Take 1 tablet (5 mg total) by mouth every 8 (eight) hours as needed for severe pain.  Patient not taking: Reported on 12/01/2021   [name], NP Not Taking Active   potassium chloride SA (KLOR-CON M) 20 MEQ tablet [id number] No Take 1 tablet (20 mEq total) by mouth daily. Absolute last refill without office visit please call [phone] to schedule  Patient not taking: Reported on 03/14/2022   [name], [name], MD Not Taking Active   sacubitril-valsartan (ENTRESTO) 24-26 MG [id number]  Take 1 tablet by mouth 2 (two) times daily. Absolute last refill without office visit please call [phone] to schedule [name], MD  Active   spironolactone (ALDACTONE) 25 MG tablet [id number]  TAKE ONE TABLET BY MOUTH EVERY MORNING Needs appointment for further refills [name], MD  Active   torsemide (DEMADEX) 20 MG tablet [id number]  TAKE ONE TABLET BY MOUTH ONCE DAILY Needs appointment for further refills [name], MD  Active   Vitamin D, Ergocalciferol, (DRISDOL) 1.25 MG (50000 UNIT) CAPS capsule [id number] No TAKE ONE CAPSULE BY MOUTH ONCE WEEKLY ON SUNDAY  Patient not taking: Reported on 04/16/2022   [name], MD Not Taking Active   zinc sulfate 220 (50 Zn) MG capsule [id number] No Take 1 capsule (220 mg total) by mouth daily.  Patient not taking: Reported on 08/01/2021   Persons, [name], [name] Not Taking Active             Patient  Active Problem List   Diagnosis Date Noted   Chronic combined systolic and diastolic CHF (congestive heart failure) ([place]) 12/01/2021   S/P BKA (below knee amputation) bilateral ([place]) 07/18/2021   Acute hematogenous osteomyelitis of right foot ([place]) 04/26/2021   Gangrene of right foot ([place])    Foot pain, right 04/14/2021   Subacute osteomyelitis, right ankle and foot ([place])    Status post below-knee amputation ([place]) 02/17/2021   Wound dehiscence    Gangrene of toe of left foot ([place])    Cutaneous abscess of left foot    Left foot infection 02/07/2021   Leukocytosis 02/07/2021   Thrombocytosis 02/07/2021   Hyponatremia 02/07/2021   AKI (acute kidney injury) ([place]) 02/07/2021   Hyperglycemia due to diabetes mellitus ([place]) 02/07/2021   Hypoalbuminemia due to protein-calorie malnutrition ([place]) 02/07/2021   Osteomyelitis of second toe  of left foot ([place])    New onset of congestive heart failure ([place]) 12/26/2020   CKD (chronic kidney disease) stage 3, GFR 30-59 ml/min (HCC) 71/04/2693   Acute systolic CHF (congestive heart failure) ([place]) 12/26/2020   Anemia, chronic disease 06/22/2020   Amputee, great toe, right ([place]) 06/20/2020   Normocytic anemia 06/20/2020   Erectile dysfunction associated with type 2 diabetes mellitus ([place]) 06/20/2020   Positive for macroalbuminuria 10/24/2019   Amputation of left great toe ([place]) 10/23/2019   Tobacco dependence 10/23/2019   Osteomyelitis of great toe of right foot ([place]) 06/04/2019   Diabetic foot infection ([place])    Noncompliance    Glaucoma    Hyperlipidemia    Essential hypertension 11/06/2003   Diabetes mellitus ([place]) 11/05/1997    Conditions to be addressed/monitored per PCP order:  Homelessness  Care Plan : LCSW Plan of Care  Updates made by Ethelda Chick since 06/28/2022 12:00 AM     Problem: Depression Identification (Depression)      Long-Range Goal: Depressive Symptoms Identified   Start Date: 05/30/2021  Recent Progress: On track   Priority: High  Note:   Timeframe:  Long-Range Goal Priority:  High Start Date:  05/30/21                        Expected End Date:  ongoing                    Current Barriers:  Acute Mental Health needs related to stress related to recent right BKA Financial constraints related to housing, Limited social support, ADL IADL limitations, Mental Health Concerns , and Social Isolation Suicidal Ideation/Homicidal Ideation: No  Clinical Social Work Goal(s):  Over the next 120 days, patient will work with SW by telephone to reduce or manage symptoms related to stress patient will work with [facility] BSW to address needs related to financial strain patient will demonstrate improved health management independence as evidenced by increasing self-care and implementing healthy coping skills to reeducate stress  Interventions: Patient interviewed and appropriate assessments performed: brief mental health assessment 66 year old male patient with a recent right BKA is having difficulty adjusting to his new way of life and experiencing financial strain. Patient will be referred to [facility] BSW for community resource support on 05/30/21. 06/19/21 and 08/28/21-Patient still denies wanting mental health resource implementation at this time. He reports that he may consider mental health support once he is able to find and settle in a new residence as he is still in need of housing.  Patient has a strong support network which includes his ex spouse, CNA and neighbors. Patient reports that one of his neighbors' sons will assist him with various task like running errands or taking out his trash which is helpful to him. Patient's CNA comes daily for 2 hours in the morning and his ex spouse checks on him daily as well. Positive reinforcement provided for his current health care management  Patient was educated on available mental health resources but patient declined needing this implementation at this time and will notify [facility]  LCSW if this changes. Patient is two months behind in rent at this time and is in need of community resource support. [facility] LCSW will update [facility] BSW on this financial concerns on 06/19/21 Patient is on the wait list for [place] program.  Patient was reminded to return missed call from [facility] Pharmacist today on 08/28/21 SDOH Interventions  Flowsheet Row Most Recent Value  SDOH Interventions   SDOH Interventions for the Following Domains Depression, Financial Strain, Housing, Stress  Financial Strain Interventions Other (Comment)  [Referral to [facility] BSW]  Housing Interventions Intervention Not Indicated  [Referral to [facility] BSW]  Stress Interventions Provide Counseling  Depression Interventions/Treatment  Patient refuses Treatment      Patient interviewed and appropriate assessments performed Provided patient with information about available mental health resources and healthy coping skills to help reduce stressors Discussed plans with patient for ongoing care management follow up and provided patient with direct contact information for care management team Solution-Focused Strategies, Active listening / Reflection utilized , Emotional Supportive Provided, and Participation in counseling encouraged  06/28/22: BSW completed a telephone outreach with patient, he stated he was at the [employer] and would be there for awhile. He was able to catch the bus to [employer] for his foodstamps and was able to recertify. He should check his card in 24-48 hours. Patient is currently working with [facility] and they will be meeting him on Monday, BSW reminded him of the appointment.  Patient Self Care Activities:  Self administers medications as prescribed Attends all scheduled provider appointments Calls provider office for new concerns or questions Ability for insight Independent living Motivation for treatment Strong family or social  support  Patient Coping Strengths:  Supportive Relationships Friends Hopefulness Self Advocate Able to Communicate Effectively  Patient Self Care Deficits:  Lacks social connections  Please see past updates related to this goal by clicking on the "Past Updates" button in the selected goal       Follow up:  Patient agrees to Care Plan and Follow-up.  Plan: The Managed Medicaid care management team will reach out to the patient again over the next 14 days.  Date/time of next scheduled Social Work care management/care coordination outreach:  07/18/22  Mickel Fuchs, Arita Miss, [place] Medicaid Team  [phone]

## 2022-07-02 ENCOUNTER — Other Ambulatory Visit: Payer: Self-pay | Admitting: *Deleted

## 2022-07-02 NOTE — Patient Instructions (Signed)
Visit Information  Mr. ATTILA MCCARTHY  - as a part of your Medicaid benefit, you are eligible for care management and care coordination services at no cost or copay. I was unable to reach you by phone today but would be happy to help you with your health related needs. Please feel free to call me @ [phone].   A member of the Managed Medicaid care management team will reach out to you again over the next 7 days.   Estanislado Emms RN, BSN [place]  [facility]

## 2022-07-02 NOTE — Patient Outreach (Signed)
Care Coordination  07/02/2022  NANA HOSELTON 14-Feb-1956 [id number]   Medicaid Managed Care   Unsuccessful Outreach Note  07/02/2022 Name: KENNA KIRN MRN: [id number] DOB: 06-Jul-1956  Referred by: Vesta Mixer, MD Reason for referral : High Risk Managed Medicaid (Unsuccessful RNCM follow up telephone outreach )   An unsuccessful telephone outreach was attempted today. The patient was referred to the case management team for assistance with care management and care coordination.   Follow Up Plan: The care management team will reach out to the patient again over the next 7 days.   Estanislado Emms RN, BSN [place]  [facility]

## 2022-07-05 ENCOUNTER — Other Ambulatory Visit: Payer: Self-pay | Admitting: *Deleted

## 2022-07-05 NOTE — Patient Outreach (Signed)
  Medicaid Managed Care   Unsuccessful Attempt Note   07/05/2022 Name: ESPN ZEMAN MRN: [id number] DOB: 05/19/1956  Referred by: Vesta Mixer, MD Reason for referral : High Risk Managed Medicaid (Unsuccessful RNCM follow up telephone outreach)   A second unsuccessful telephone outreach was attempted today. The patient was referred to the case management team for assistance with care management and care coordination.    Follow Up Plan: A HIPAA compliant phone message was left for the patient providing contact information and requesting a return call.    Estanislado Emms RN, BSN   [facility]

## 2022-07-05 NOTE — Patient Instructions (Signed)
Visit Information  [name]  - as a part of your Medicaid benefit, you are eligible for care management and care coordination services at no cost or copay. I was unable to reach you by phone today but would be happy to help you with your health related needs. Please feel free to call me @ [phone].   A member of the Managed Medicaid care management team will reach out to you again over the next 14 days.   Karah Caruthers RN, BSN [place]  [facility] RN Care Coordinator   

## 2022-07-06 ENCOUNTER — Ambulatory Visit: Payer: Self-pay | Admitting: *Deleted

## 2022-07-06 ENCOUNTER — Encounter: Payer: Self-pay | Admitting: *Deleted

## 2022-07-06 ENCOUNTER — Telehealth: Payer: Self-pay | Admitting: *Deleted

## 2022-07-06 DIAGNOSIS — Z419 Encounter for procedure for purposes other than remedying health state, unspecified: Secondary | ICD-10-CM | POA: Diagnosis not present

## 2022-07-06 NOTE — Patient Outreach (Signed)
Medicaid Managed Care   Nurse Care Manager Note  07/06/2022 Name:  BACH ROCCHI MRN:  [id number] DOB:  1955/12/08  ABIMAEL ZEITER is an 66 y.o. year old male who is a primary patient of [name], [name], MD.  The Medicaid Managed Care Coordination team was consulted for assistance with:    DMII  Mr. Vasil was given information about Medicaid Managed Care Coordination team services today. [name] Patient agreed to services and verbal consent obtained.  Engaged with patient by telephone for follow up visit in response to provider referral for case management and/or care coordination services.   Assessments/Interventions:  Review of past medical history, allergies, medications, health status, including review of consultants reports, laboratory and other test data, was performed as part of comprehensive evaluation and provision of chronic care management services.  SDOH (Social Determinants of Health) assessments and interventions performed:   Care Plan  Allergies  Allergen Reactions   Bee Venom Hives, Itching and Swelling    Medications Reviewed Today     Reviewed by [name], RN (Registered Nurse) on 07/06/22 at 1134  Med List Status: <None>   Medication Order Taking? Sig Documenting Provider Last Dose Status Informant  Accu-Chek Softclix Lancets lancets [id number]  Use to test blood sugars up to 4 times daily as directed.  Patient not taking: Reported on 04/16/2022   [name], MD  Active   amiodarone (PACERONE) 200 MG tablet [id number]  Take 1 tablet (200 mg total) by mouth daily. [name], MD  Active   aspirin (ASPIRIN LOW DOSE) 81 MG chewable tablet [id number]  Chew 1 tablet (81 mg total) by mouth daily.  Patient not taking: Reported on 04/16/2022   [name], MD  Active   atorvastatin (LIPITOR) 10 MG tablet [id number]  TAKE ONE TABLET BY MOUTH EVERY Nadene Rubins, MD  Active   Blood Glucose Monitoring Suppl (ACCU-CHEK GUIDE) w/Device  KIT [id number]  Use to test blood sugars up to 4 times daily as directed.  Patient not taking: Reported on 04/16/2022   [name], MD  Active   Blood Glucose Monitoring Suppl (TRUE METRIX METER) w/Device KIT [id number]  Use as directed  Patient not taking: Reported on 11/20/2021   [name], MD  Active Other  carvedilol (COREG) 3.125 MG tablet [id number]  TAKE ONE TABLET BY MOUTH EVERY MORNING and TAKE ONE TABLET BY MOUTH EVERY EVENING WITH A MEAL [name], MD  Active   digoxin (LANOXIN) 0.125 MG tablet [id number]  TAKE ONE TABLET BY MOUTH EVERY EVENING WITH SUPPER [name], MD  Active   empagliflozin (JARDIANCE) 10 MG TABS tablet [id number]  TAKE ONE TABLET BY MOUTH AT Gregary Cromer, MD  Active   FEROSUL 325 (65 Fe) MG tablet [id number]  TAKE ONE TABLET BY MOUTH EVERY MORNING WITH BREAKFAST [name], MD  Active   gabapentin (NEURONTIN) 300 MG capsule [id number]  TAKE ONE CAPSULE BY MOUTH THREE TIMES DAILY  Patient not taking: Reported on 04/16/2022   [name], MD  Active   glucose blood (ACCU-CHEK GUIDE) test strip [id number]  Use to test blood sugars up to 4 times daily as directed.  Patient not taking: Reported on 04/16/2022   [name], MD  Active   isosorbide-hydrALAZINE (BIDIL) 20-37.5 MG tablet [id number]  TAKE ONE TABLET BY MOUTH THREE TIMES DAILY Needs appointment for further refills [name], MD  Active   metFORMIN (GLUCOPHAGE) 500 MG  tablet [id number]  Take 1 tablet (500 mg total) by mouth 2 (two) times daily with a meal. [name], MD  Active            Med Note (Keywon Mestre A   Fri May 04, 2022  1:18 PM) "Taking every now and then"  nutrition supplement, [name]) PACK [id number]  Take 1 packet by mouth 2 (two) times daily between meals.  Patient not taking: Reported on 11/20/2021   Persons, [name], [name]  Active   oxyCODONE (ROXICODONE) 5 MG immediate release tablet [id number]  Take 1 tablet (5 mg total)  by mouth every 8 (eight) hours as needed for severe pain.  Patient not taking: Reported on 12/01/2021   [name], NP  Active   potassium chloride SA (KLOR-CON M) 20 MEQ tablet [id number]  Take 1 tablet (20 mEq total) by mouth daily. Absolute last refill without office visit please call [phone] to schedule  Patient not taking: Reported on 03/14/2022   [name], [name], MD  Active   sacubitril-valsartan (ENTRESTO) 24-26 MG [id number]  Take 1 tablet by mouth 2 (two) times daily. Absolute last refill without office visit please call [phone] to schedule [name], MD  Active   spironolactone (ALDACTONE) 25 MG tablet [id number]  TAKE ONE TABLET BY MOUTH EVERY MORNING Needs appointment for further refills [name], MD  Active   torsemide (DEMADEX) 20 MG tablet [id number]  TAKE ONE TABLET BY MOUTH ONCE DAILY Needs appointment for further refills [name], MD  Active   Vitamin D, Ergocalciferol, (DRISDOL) 1.25 MG (50000 UNIT) CAPS capsule [id number]  TAKE ONE CAPSULE BY MOUTH ONCE WEEKLY ON SUNDAY  Patient not taking: Reported on 04/16/2022   [name], MD  Active   zinc sulfate 220 (50 Zn) MG capsule [id number]  Take 1 capsule (220 mg total) by mouth daily.  Patient not taking: Reported on 08/01/2021   Persons, [name], [name]  Active             Patient Active Problem List   Diagnosis Date Noted   Chronic combined systolic and diastolic CHF (congestive heart failure) ([place]) 12/01/2021   S/P BKA (below knee amputation) bilateral ([place]) 07/18/2021   Acute hematogenous osteomyelitis of right foot ([place]) 04/26/2021   Gangrene of right foot ([place])    Foot pain, right 04/14/2021   Subacute osteomyelitis, right ankle and foot ([place])    Status post below-knee amputation ([place]) 02/17/2021   Wound dehiscence    Gangrene of toe of left foot ([place])    Cutaneous abscess of left foot    Left foot infection 02/07/2021   Leukocytosis 02/07/2021   Thrombocytosis  02/07/2021   Hyponatremia 02/07/2021   AKI (acute kidney injury) ([place]) 02/07/2021   Hyperglycemia due to diabetes mellitus ([place]) 02/07/2021   Hypoalbuminemia due to protein-calorie malnutrition ([place]) 02/07/2021   Osteomyelitis of second toe of left foot ([place])    New onset of congestive heart failure ([place]) 12/26/2020   CKD (chronic kidney disease) stage 3, GFR 30-59 ml/min (HCC) 60/60/0459   Acute systolic CHF (congestive heart failure) ([place]) 12/26/2020   Anemia, chronic disease 06/22/2020   Amputee, great toe, right ([place]) 06/20/2020   Normocytic anemia 06/20/2020   Erectile dysfunction associated with type 2 diabetes mellitus ([place]) 06/20/2020   Positive for macroalbuminuria 10/24/2019   Amputation of left great toe ([place]) 10/23/2019   Tobacco dependence 10/23/2019   Osteomyelitis of great toe of right foot ([place]) 06/04/2019  Diabetic foot infection ([place])    Noncompliance    Glaucoma    Hyperlipidemia    Essential hypertension 11/06/2003   Diabetes mellitus ([place]) 11/05/1997    Conditions to be addressed/monitored per PCP order:  DMII  Care Plan : RN Care Manager Plan of Care  Updates made by [name], RN since 07/06/2022 12:00 AM     Problem: Health Management needs related to DMII      Long-Range Goal: Development of Plan of Care to address Health Management needs related to DMII   Start Date: 02/27/2022  Expected End Date: 09/04/2022  Priority: High  Note:   Current Barriers:  Care Coordination needs related to Housing barriers  Chronic Disease Management support and education needs related to DMII, Homelessness, and Stress  Mr. Guedes is staying at [employer]. He is working with multiple agencies including [employer] and [employer] [facility] for housing and met with them on Monday. He is working with [facility] for a new phone. His belongings, including his medications are now in a storage building at his ex wife's house. He reports taking metformin  and torsemide irregularly.  RNCM Clinical Goal(s):  Patient will verbalize understanding of plan for management of DMII as evidenced by patient report continue to work with RN Care Manager and/or Social Worker to address care management and care coordination needs related to Homelessness and managing stress as evidenced by adherence to CM Team Scheduled appointments     through collaboration with RN Care manager, provider, and care team.   Interventions: Inter-disciplinary care team collaboration (see longitudinal plan of care) Evaluation of current treatment plan related to  self management and patient's adherence to plan as established by provider Collaboration with [name](filling in for [facility]) with [facility] [phone], and Janett Billow with [employer] [facility] Provided therapeutic listening Advised patient to stay hydrated and keep his skin clean and free from injury Reviewed upcoming appointment with PCP on 08/14/22 @ 2:30pm, RNCM will assist with arranging transportation   Diabetes:  (Status: Goal Not Met.) Long Term Goal   Lab Results  Component Value Date   HGBA1C 6.0 07/18/2021   @ Assessed patient's understanding of A1c goal: <7% Discussed plans with patient for ongoing care management follow up and provided patient with direct contact information for care management team;      Referral made to social work team for assistance with managing stress and procuring housing ;      Assessed social determinant of health barriers;        Advised patient to retrieve his glucometer from storage and begin checking BS Discussed the importance of medication compliance, advised patient to take medications as packaged from [facility]  Patient Goals/Self-Care Activities: Attend all scheduled provider appointments Call provider office for new concerns or questions  Work with the social worker to address care coordination needs and will continue to work with the  clinical team to address health care and disease management related needs Follow up with housing resources       Follow Up:  Patient agrees to Care Plan and Follow-up.  Plan: The Managed Medicaid care management team will reach out to the patient again over the next 14 days.  Date/time of next scheduled RN care management/care coordination outreach:  07/20/22 @ 10:30am  [name] RN, BSN [place] RN Care Coordinator

## 2022-07-06 NOTE — Patient Instructions (Signed)
Visit Information  Mr. Eisenhour was given information about Medicaid Managed Care team care coordination services as a part of their [facility] Medicaid benefit. [name] verbally consented to engagement with the [facility] Managed Care team.   If you are experiencing a medical emergency, please call 911 or report to your local emergency department or urgent care.   If you have a non-emergency medical problem during routine business hours, please contact your provider's office and ask to speak with a nurse.   For questions related to your [facility] health plan, please call: [phone] or go here:[email or web address]  If you would like to schedule transportation through your [facility] plan, please call the following number at least 2 days in advance of your appointment: [phone].  You can also use the MTM portal or MTM mobile app to manage your rides. For the portal, please go to [email or web address].  Call the [place] at [phone], at any time, 24 hours a day, 7 days a week. If you are in danger or need immediate medical attention call 911.  If you would like help to quit smoking, call 1-800-QUIT-NOW 902-739-0930) OR Espaol: 1-855-Djelo-Ya (7-544-920-1007) o para ms informacin haga clic aqu or Text READY to 200-400 to register via text  Mr. Genrich,   Please see education materials related to diabetes provided as print materials.   Patient unable to receive mail at this time.  Telephone follow up appointment with Managed Medicaid care management team member scheduled for:07/20/22 @ 10:30am  [name] RN, BSN [place] RN Care Coordinator   Following is a copy of your plan of care:  Care Plan : RN Care Manager Plan of Care  Updates made by [name], RN since 07/06/2022 12:00 AM     Problem: Health Management needs related to DMII      Long-Range Goal: Development of Plan of Care to  address Health Management needs related to DMII   Start Date: 02/27/2022  Expected End Date: 09/04/2022  Priority: High  Note:   Current Barriers:  Care Coordination needs related to Housing barriers  Chronic Disease Management support and education needs related to DMII, Homelessness, and Stress  Mr. Cumba is staying at [employer]. He is working with multiple agencies including [employer] and [employer] [facility] for housing and met with them on Monday. He is working with [facility] for a new phone. His belongings, including his medications are now in a storage building at his ex wife's house. He reports taking metformin and torsemide irregularly.  RNCM Clinical Goal(s):  Patient will verbalize understanding of plan for management of DMII as evidenced by patient report continue to work with RN Care Manager and/or Social Worker to address care management and care coordination needs related to Homelessness and managing stress as evidenced by adherence to CM Team Scheduled appointments     through collaboration with RN Care manager, provider, and care team.   Interventions: Inter-disciplinary care team collaboration (see longitudinal plan of care) Evaluation of current treatment plan related to  self management and patient's adherence to plan as established by provider Collaboration with [name](filling in for [facility]) with [facility] [phone], and Janett Billow with [employer] [facility] Provided therapeutic listening Advised patient to stay hydrated and keep his skin clean and free from injury Reviewed upcoming appointment with PCP on 08/14/22 @ 2:30pm, RNCM will assist with arranging transportation   Diabetes:  (Status: Goal Not Met.) Long Term Goal  Lab Results  Component Value Date   HGBA1C 6.0 07/18/2021   @ Assessed patient's understanding of A1c goal: <7% Discussed plans with patient for ongoing care management follow up and  provided patient with direct contact information for care management team;      Referral made to social work team for assistance with managing stress and procuring housing ;      Assessed social determinant of health barriers;        Advised patient to retrieve his glucometer from storage and begin checking BS Discussed the importance of medication compliance, advised patient to take medications as packaged from [facility]  Patient Goals/Self-Care Activities: Attend all scheduled provider appointments Call provider office for new concerns or questions  Work with the social worker to address care coordination needs and will continue to work with the clinical team to address health care and disease management related needs Follow up with housing resources

## 2022-07-11 ENCOUNTER — Other Ambulatory Visit: Payer: Self-pay | Admitting: Critical Care Medicine

## 2022-07-11 ENCOUNTER — Encounter: Payer: Self-pay | Admitting: Critical Care Medicine

## 2022-07-11 ENCOUNTER — Other Ambulatory Visit: Payer: Self-pay

## 2022-07-11 ENCOUNTER — Encounter: Payer: Self-pay | Admitting: Physician Assistant

## 2022-07-11 ENCOUNTER — Encounter: Payer: Self-pay | Admitting: *Deleted

## 2022-07-11 DIAGNOSIS — E1121 Type 2 diabetes mellitus with diabetic nephropathy: Secondary | ICD-10-CM

## 2022-07-11 MED ORDER — EMPAGLIFLOZIN 10 MG PO TABS: ORAL_TABLET | ORAL | 2 refills | Status: DC

## 2022-07-11 MED ORDER — ISOSORB DINITRATE-HYDRALAZINE 20-37.5 MG PO TABS
ORAL_TABLET | ORAL | 0 refills | Status: DC
Start: 2022-07-11 — End: 2022-08-14

## 2022-07-11 MED ORDER — SPIRONOLACTONE 25 MG PO TABS
ORAL_TABLET | ORAL | 0 refills | Status: DC
Start: 2022-07-11 — End: 2022-08-14

## 2022-07-11 MED ORDER — ENTRESTO 24-26 MG PO TABS
1.0000 | ORAL_TABLET | Freq: Two times a day (BID) | ORAL | 0 refills | Status: DC
  Filled 2022-07-11: qty 60, 30d supply, fill #0

## 2022-07-11 MED ORDER — CARVEDILOL 3.125 MG PO TABS: 3.1250 mg | ORAL_TABLET | Freq: Two times a day (BID) | ORAL | 0 refills | Status: DC

## 2022-07-11 MED ORDER — AMIODARONE HCL 200 MG PO TABS: 200.0000 mg | ORAL_TABLET | Freq: Every day | ORAL | 0 refills | Status: DC

## 2022-07-11 MED ORDER — DIGOXIN 125 MCG PO TABS: ORAL_TABLET | ORAL | 0 refills | Status: DC

## 2022-07-11 MED ORDER — METFORMIN HCL 500 MG PO TABS: 500.0000 mg | ORAL_TABLET | Freq: Two times a day (BID) | ORAL | 0 refills | Status: DC

## 2022-07-11 NOTE — Congregational Nurse Program (Unsigned)
Pt new to shelter. Poor history given to nurse. Pt is not happy at shelter. Doesn't appear to take meds correctly, he is agreeable to RN preparing pillbox. He does not check his cbg on regular basis, will do cbg shortly.
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
## 2022-07-16 ENCOUNTER — Telehealth: Payer: Self-pay | Admitting: Emergency Medicine

## 2022-07-16 DIAGNOSIS — K0381 Cracked tooth: Secondary | ICD-10-CM

## 2022-07-16 NOTE — Telephone Encounter (Signed)
Copied from CRM [id number]. Topic: Referral - Request for Referral >> Jul 16, 2022 11:06 AM Everette C wrote: Has patient seen PCP for this complaint? No. *If NO, is insurance requiring patient see PCP for this issue before PCP can refer them? Referral for which specialty: Dentistry  Preferred provider/office: Patient has no preference  Reason for referral: Patient has cracked a tooth

## 2022-07-18 ENCOUNTER — Encounter: Payer: Self-pay | Admitting: Critical Care Medicine

## 2022-07-18 ENCOUNTER — Other Ambulatory Visit: Payer: Self-pay

## 2022-07-18 NOTE — Patient Outreach (Signed)
Care Coordination  07/18/2022  [name] 23-Feb-1956 [id number]    Medicaid Managed Care   Unsuccessful Outreach Note  07/18/2022 Name: Scott Greer MRN: [id number] DOB: [birth year]  Referred by: Vesta Mixer, MD Reason for referral : High Risk Managed Medicaid (MM Social work Unsuccessful Telephone outreach )   An unsuccessful telephone outreach was attempted today. The patient was referred to the case management team for assistance with care management and care coordination.   Follow Up Plan: The care management team will reach out to the patient again over the next 30 days.   Gus Puma, BSW, [place] [facility]  [place]  High Risk Managed Medicaid Team  [phone]

## 2022-07-18 NOTE — Patient Instructions (Signed)
Visit Information  Mr. GIO JANOSKI  - as a part of your Medicaid benefit, you are eligible for care management and care coordination services at no cost or copay. I was unable to reach you by phone today but would be happy to help you with your health related needs. Please feel free to call me @ [phone]).   A member of the Managed Medicaid care management team will reach out to you again over the next 30 days.   Gus Puma, BSW, [place] [facility]  [place]  High Risk Managed Medicaid Team  [phone]

## 2022-07-18 NOTE — Telephone Encounter (Signed)
Please advise.----DD,RMA  

## 2022-07-19 NOTE — Addendum Note (Signed)
Addended by: Jonah Blue B on: 07/19/2022 07:31 AM   Modules accepted: Orders

## 2022-07-19 NOTE — Telephone Encounter (Signed)
Dental referral submitted. 
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
## 2022-07-20 ENCOUNTER — Other Ambulatory Visit: Payer: Self-pay | Admitting: *Deleted

## 2022-07-20 NOTE — Patient Instructions (Signed)
Visit Information  Mr. Armando L Heiland  - as a part of your Medicaid benefit, you are eligible for care management and care coordination services at no cost or copay. I was unable to reach you by phone today but would be happy to help you with your health related needs. Please feel free to call me @ [phone].   A member of the Managed Medicaid care management team will reach out to you again over the next 14 days.   Ashleynicole Mcclees RN, BSN [place]  [facility] RN Care Coordinator   

## 2022-07-20 NOTE — Patient Outreach (Signed)
  Medicaid Managed Care   Unsuccessful Attempt Note   07/20/2022 Name: Scott Greer MRN: [id number] DOB: 1956-09-02  Referred by: Vesta Mixer, MD Reason for referral : High Risk Managed Medicaid (Unsuccessful RNCM follow up telephone outreach)   An unsuccessful telephone outreach was attempted today. The patient was referred to the case management team for assistance with care management and care coordination.    RNCM sent message to H. Alfredo Bach, Congregational Nurse, including information for medical transportation [phone], requesting assistance scheduling transportation to upcoming PCP appointment on 08/14/22 if she has contact with Mr. Fuston.  Follow Up Plan: The Managed Medicaid care management team will reach out to the patient again over the next 14 days.    Estanislado Emms RN, BSN [place]  [facility]

## 2022-07-22 ENCOUNTER — Other Ambulatory Visit: Payer: Self-pay | Admitting: Internal Medicine

## 2022-07-22 ENCOUNTER — Other Ambulatory Visit (HOSPITAL_COMMUNITY): Payer: Self-pay | Admitting: Cardiology

## 2022-07-23 NOTE — Telephone Encounter (Signed)
Requested Prescriptions  Pending Prescriptions Disp Refills  . gabapentin (NEURONTIN) 300 MG capsule [Pharmacy Med Name: gabapentin 300 mg capsule] 270 capsule 0    Sig: TAKE ONE CAPSULE BY MOUTH THREE TIMES DAILY     Neurology: Anticonvulsants - gabapentin Failed - 07/22/2022  8:04 AM      Failed - Cr in normal range and within 360 days    Creatinine, Ser  Date Value Ref Range Status  03/14/2022 1.45 (H) 0.61 - 1.24 mg/dL Final         Failed - Completed PHQ-2 or PHQ-9 in the last 360 days      Passed - Valid encounter within last 12 months    Recent Outpatient Visits          4 months ago [place], Dalbert Batman, MD   4 months ago No-show for appointment   [place] Karle Plumber B, MD   8 months ago Type 2 diabetes mellitus with other circulatory complications [facility])   [place], Deborah B, MD   1 year ago Type 2 diabetes mellitus with other circulatory complications [facility])   [place], Deborah B, MD   1 year ago Type 2 diabetes mellitus with other circulatory complications [facility])   [place], Deborah B, MD      Future Appointments            In 3 weeks Wynetta Emery Dalbert Batman, MD [place]

## 2022-07-25 ENCOUNTER — Other Ambulatory Visit: Payer: Self-pay | Admitting: *Deleted

## 2022-07-25 NOTE — Patient Outreach (Addendum)
  Medicaid Managed Care   Unsuccessful Attempt Note   07/25/2022 Name: Scott Greer MRN: [id number] DOB: 1955-11-20  Referred by: Thayer Headings, MD Reason for referral : High Risk Managed Medicaid (Unsuccessful RNCM telephone outreach)   An unsuccessful telephone outreach was attempted today. The patient was referred to the case management team for assistance with care management and care coordination.    Follow Up Plan: The Managed Medicaid care management team will reach out to the patient again over the next 14 days.   RNCM sent message to H. Buena Irish, Congregational Nurse, Theressa Stamps with [place] with [facility] and Mickel Fuchs, [place] with MM team for collaboration, requesting assistance with arranging transportation to upcoming PCP appointment on 08/14/22.   [name] RN, BSN [place]  [employer] manager

## 2022-07-25 NOTE — Patient Instructions (Signed)
Visit Information  [name]  - as a part of your Medicaid benefit, you are eligible for care management and care coordination services at no cost or copay. I was unable to reach you by phone today but would be happy to help you with your health related needs. Please feel free to call me @ [phone].   A member of the Managed Medicaid care management team will reach out to you again over the next 14 days.   [name] RN, BSN [place]  [employer] manager

## 2022-08-01 ENCOUNTER — Encounter: Payer: Self-pay | Admitting: Physician Assistant
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
## 2022-08-02 ENCOUNTER — Other Ambulatory Visit: Payer: Medicaid Other | Admitting: *Deleted

## 2022-08-02 NOTE — Patient Instructions (Signed)
Visit Information  Scott Greer was given information about Medicaid Managed Care team care coordination services as a part of their [facility] Medicaid benefit. Scott Greer verbally consented to engagement with the [facility] Managed Care team.   If you are experiencing a medical emergency, please call 911 or report to your local emergency department or urgent care.   If you have a non-emergency medical problem during routine business hours, please contact your provider's office and ask to speak with a nurse.   For questions related to your [facility] health plan, please call: [phone] or go here:[email or web address]  If you would like to schedule transportation through your [facility] plan, please call the following number at least 2 days in advance of your appointment: [phone].  You can also use the MTM portal or MTM mobile app to manage your rides. For the portal, please go to [email or web address].  Call the [place] at [phone], at any time, 24 hours a day, 7 days a week. If you are in danger or need immediate medical attention call 911.  If you would like help to quit smoking, call 1-800-QUIT-NOW (828) 626-5964) OR Espaol: 1-855-Djelo-Ya (1-007-121-9758) o para ms informacin haga clic aqu or Text READY to 200-400 to register via text  Scott Greer,   Please see education materials related to DM provided as print materials.   The patient verbalized understanding of instructions, educational materials, and care plan provided today and DECLINED offer to receive copy of patient instructions, educational materials, and care plan.   Telephone follow up appointment with Managed Medicaid care management team member scheduled for:09/04/22 @ 10:30am  Scott Joiner RN, BSN [place] RN Care Coordinator   Following is a copy of your plan of care:  Care Plan : Scott Greer of Care  Updates made by Scott Montane, RN since 08/02/2022 12:00 AM     Problem: Health Management needs related to DMII      Long-Range Goal: Development of Plan of Care to address Health Management needs related to DMII   Start Date: 02/27/2022  Expected End Date: 09/04/2022  Priority: High  Note:   Current Barriers:  Care Coordination needs related to Housing barriers  Chronic Disease Management support and education needs related to DMII, Homelessness, and Stress  Scott Greer is staying at [employer]. He is working with multiple agencies including [facility] for housing. He has two upcoming appointments and needs assistance arranging transportation.  RNCM Clinical Goal(s):  Patient will verbalize understanding of plan for management of DMII as evidenced by patient report continue to work with RN Care Manager and/or Social Worker to address care management and care coordination needs related to Homelessness and managing stress as evidenced by adherence to CM Team Scheduled appointments     through collaboration with RN Care manager, provider, and care team.   Interventions: Inter-disciplinary care team collaboration (see longitudinal plan of care) Evaluation of current treatment plan related to  self management and patient's adherence to plan as established by provider Assisted with arranging transportation to [place] on 08/07/22, pick up from [facility] at 11:45 am, return ride at 2 pm and [place] on 08/14/22, pick up from [employer] at 12:15 pm and return ride at 2:30pm Provided patient with MTM transportation number [phone]   Diabetes:  (Status: Goal Not Met.) Long Term Goal   Lab Results  Component Value Date  HGBA1C 6.0 07/18/2021   @ Assessed patient's understanding of A1c goal: <7% Discussed plans with patient for ongoing care management follow up and provided patient with direct contact information for care  management team;      Referral made to social work team for assistance with managing stress and procuring housing ;      Assessed social determinant of health barriers;        Advised patient to retrieve his glucometer from storage and begin checking BS Discussed the importance of medication compliance, advised patient to take medications as packaged from [facility]  Patient Goals/Self-Care Activities: Attend all scheduled provider appointments Call provider office for new concerns or questions  Work with the social worker to address care coordination needs and will continue to work with the clinical team to address health care and disease management related needs Follow up with housing resources

## 2022-08-02 NOTE — Patient Outreach (Signed)
Medicaid Managed Care   Nurse Care Manager Note  08/02/2022 Name:  JOSH NICOLOSI MRN:  [id number] DOB:  May 08, 1956  Scott Greer is an 66 y.o. year old male who is a primary patient of [name], [name], MD.  The Medicaid Managed Care Coordination team was consulted for assistance with:    DMII Homelessness  Mr. Oberhaus was given information about Medicaid Managed Care Coordination team services today. [name] Patient agreed to services and verbal consent obtained.  Engaged with patient by telephone for follow up visit in response to provider referral for case management and/or care coordination services.   Assessments/Interventions:  Review of past medical history, allergies, medications, health status, including review of consultants reports, laboratory and other test data, was performed as part of comprehensive evaluation and provision of chronic care management services.  SDOH (Social Determinants of Health) assessments and interventions performed: SDOH Interventions    Flowsheet Row Patient Outreach Telephone from 06/26/2022 in [place] Patient Outreach Telephone from 04/16/2022 in [place] Patient Outreach Telephone from 03/06/2022 in [place] Patient Outreach Telephone from 02/27/2022 in [employer] Telephone from 02/19/2022 in [place] Patient Outreach Telephone from 12/11/2021 in [place] Coordination  SDOH Interventions        Food Insecurity Interventions -- -- -- -- -- Intervention Not Indicated  [Patient receiving 3 meals a day.]  Housing Interventions Other (Comment)  [Patient is currently homeless. RNCM collaborated with [employer]. [name] will follow up this afternoon after contacting [facility].  [name] with Social Services with APS is now involved] Intervention Not Indicated  [Patient working with [employer] and an agent [name] to find housing. Currently living in [place].] -- -- Other (Comment)  [[facility] and [place] follow up] --  Transportation Interventions -- Other (Comment)  [Provided with [employer] transportation [phone] Other (Comment)  [Provided with [employer] [phone] -- -- --  Physical Activity Interventions -- -- -- -- -- Intervention Not Indicated  [Patient scheduled to attend PT to improve mobility.]  Stress Interventions -- -- -- Other (Comment)  [Referral to LCSW] -- --       Care Plan  Allergies  Allergen Reactions   Bee Venom Hives, Itching and Swelling    Medications Reviewed Today     Reviewed by Elsie Stain, MD (Physician) on 07/11/22 at 1434  Med List Status: <None>   Medication Order Taking? Sig Documenting Provider Last Dose Status Informant  Accu-Chek Softclix Lancets lancets [id number] No Use to test blood sugars up to 4 times daily as directed.  Patient not taking: Reported on 04/16/2022   [name], MD Not Taking Active   amiodarone (PACERONE) 200 MG tablet [id number] No Take 1 tablet (200 mg total) by mouth daily.  Patient not taking: Reported on 07/06/2022   [name], MD Not Taking Active   aspirin (ASPIRIN LOW DOSE) 81 MG chewable tablet [id number] No Chew 1 tablet (81 mg total) by mouth daily.  Patient not taking: Reported on 04/16/2022   [name], MD Not Taking Active   atorvastatin (LIPITOR) 10 MG tablet [id number] No TAKE ONE TABLET BY MOUTH EVERY EVENING  Patient not taking: Reported on 07/06/2022   [name], MD Not Taking Active   Blood Glucose Monitoring Suppl (ACCU-CHEK GUIDE) w/Device KIT [id number] No Use to  test blood sugars up to 4 times daily as directed.  Patient not taking: Reported on 04/16/2022   [name], MD Not Taking Active   Blood Glucose  Monitoring Suppl (TRUE METRIX METER) w/Device KIT [id number] No Use as directed  Patient not taking: Reported on 11/20/2021   [name], MD Not Taking Active Other  carvedilol (COREG) 3.125 MG tablet [id number] No TAKE ONE TABLET BY MOUTH EVERY MORNING and TAKE ONE TABLET BY MOUTH EVERY EVENING WITH A MEAL  Patient not taking: Reported on 07/06/2022   [name], MD Not Taking Active   digoxin (LANOXIN) 0.125 MG tablet [id number] No TAKE ONE TABLET BY MOUTH EVERY EVENING WITH SUPPER  Patient not taking: Reported on 07/06/2022   [name], MD Not Taking Active   empagliflozin (JARDIANCE) 10 MG TABS tablet [id number] No TAKE ONE TABLET BY MOUTH AT NOON  Patient not taking: Reported on 07/06/2022   [name], MD Not Taking Active   FEROSUL 325 (65 Fe) MG tablet [id number] No TAKE ONE TABLET BY MOUTH EVERY MORNING WITH BREAKFAST  Patient not taking: Reported on 07/06/2022   [name], MD Not Taking Active   gabapentin (NEURONTIN) 300 MG capsule [id number] No TAKE ONE CAPSULE BY MOUTH THREE TIMES DAILY  Patient not taking: Reported on 04/16/2022   [name], MD Not Taking Active   glucose blood (ACCU-CHEK GUIDE) test strip [id number] No Use to test blood sugars up to 4 times daily as directed.  Patient not taking: Reported on 04/16/2022   [name], MD Not Taking Active   isosorbide-hydrALAZINE (BIDIL) 20-37.5 MG tablet [id number] No TAKE ONE TABLET BY MOUTH THREE TIMES DAILY Needs appointment for further refills  Patient not taking: Reported on 07/06/2022   [name], MD Not Taking Active   metFORMIN (GLUCOPHAGE) 500 MG tablet [id number] No Take 1 tablet (500 mg total) by mouth 2 (two) times daily with a meal. [name], MD Taking Active            Med Note (Annis Lagoy A   Fri May 04, 2022  1:18 PM) "Taking every now and then"  nutrition supplement, [name]) PACK [id number] No Take 1 packet by mouth 2 (two) times daily between meals.   Patient not taking: Reported on 11/20/2021   Persons, [name], [name] Not Taking Active   oxyCODONE (ROXICODONE) 5 MG immediate release tablet [id number] No Take 1 tablet (5 mg total) by mouth every 8 (eight) hours as needed for severe pain.  Patient not taking: Reported on 12/01/2021   [name], NP Not Taking Active   potassium chloride SA (KLOR-CON M) 20 MEQ tablet [id number] No Take 1 tablet (20 mEq total) by mouth daily. Absolute last refill without office visit please call [phone] to schedule [name], [name], MD Taking Active   sacubitril-valsartan (ENTRESTO) 24-26 MG [id number] No Take 1 tablet by mouth 2 (two) times daily. Absolute last refill without office visit please call [phone] to schedule  Patient not taking: Reported on 07/06/2022   [name], MD Not Taking Active   spironolactone (ALDACTONE) 25 MG tablet [id number] No TAKE ONE TABLET BY MOUTH EVERY MORNING Needs appointment for further refills  Patient not taking: Reported on 07/06/2022   [name], MD Not Taking Active   torsemide (DEMADEX) 20 MG tablet [id number] No TAKE ONE TABLET BY MOUTH ONCE DAILY Needs appointment for further refills [name], MD Taking Active  Med Note (Jannifer Fischler A   Fri Jul 06, 2022 11:36 AM) "Taking every now and then"  Vitamin D, Ergocalciferol, (DRISDOL) 1.25 MG (50000 UNIT) CAPS capsule [id number] No TAKE ONE CAPSULE BY MOUTH ONCE WEEKLY ON SUNDAY  Patient not taking: Reported on 04/16/2022   [name], MD Not Taking Active   zinc sulfate 220 (50 Zn) MG capsule [id number] No Take 1 capsule (220 mg total) by mouth daily.  Patient not taking: Reported on 08/01/2021   Persons, [name], [name] Not Taking Active             Patient Active Problem List   Diagnosis Date Noted   Chronic combined systolic and diastolic CHF (congestive heart failure) ([place]) 12/01/2021   S/P BKA (below knee amputation) bilateral ([place]) 07/18/2021   Acute hematogenous  osteomyelitis of right foot ([place]) 04/26/2021   Gangrene of right foot ([place])    Foot pain, right 04/14/2021   Subacute osteomyelitis, right ankle and foot ([place])    Status post below-knee amputation ([place]) 02/17/2021   Wound dehiscence    Gangrene of toe of left foot ([place])    Cutaneous abscess of left foot    Left foot infection 02/07/2021   Leukocytosis 02/07/2021   Thrombocytosis 02/07/2021   Hyponatremia 02/07/2021   AKI (acute kidney injury) ([place]) 02/07/2021   Hyperglycemia due to diabetes mellitus ([place]) 02/07/2021   Hypoalbuminemia due to protein-calorie malnutrition ([place]) 02/07/2021   Osteomyelitis of second toe of left foot ([place])    New onset of congestive heart failure ([place]) 12/26/2020   CKD (chronic kidney disease) stage 3, GFR 30-59 ml/min (HCC) 94/49/6759   Acute systolic CHF (congestive heart failure) ([place]) 12/26/2020   Anemia, chronic disease 06/22/2020   Amputee, great toe, right ([place]) 06/20/2020   Normocytic anemia 06/20/2020   Erectile dysfunction associated with type 2 diabetes mellitus ([place]) 06/20/2020   Positive for macroalbuminuria 10/24/2019   Amputation of left great toe ([place]) 10/23/2019   Tobacco dependence 10/23/2019   Osteomyelitis of great toe of right foot ([place]) 06/04/2019   Diabetic foot infection ([place])    Noncompliance    Glaucoma    Hyperlipidemia    Essential hypertension 11/06/2003   Diabetes mellitus ([place]) 11/05/1997    Conditions to be addressed/monitored per PCP order:  DMII and Homelessness  Care Plan : RN Care Manager Plan of Care  Updates made by [name], RN since 08/02/2022 12:00 AM     Problem: Health Management needs related to DMII      Long-Range Goal: Development of Plan of Care to address Health Management needs related to DMII   Start Date: 02/27/2022  Expected End Date: 09/04/2022  Priority: High  Note:   Current Barriers:  Care Coordination needs related to Housing barriers  Chronic Disease Management support and  education needs related to DMII, Homelessness, and Stress  Mr. Lanphere is staying at [employer]. He is working with multiple agencies including [facility] for housing. He has two upcoming appointments and needs assistance arranging transportation.  RNCM Clinical Goal(s):  Patient will verbalize understanding of plan for management of DMII as evidenced by patient report continue to work with RN Care Manager and/or Social Worker to address care management and care coordination needs related to Homelessness and managing stress as evidenced by adherence to CM Team Scheduled appointments     through collaboration with RN Care manager, provider, and care team.   Interventions: Inter-disciplinary care team collaboration (see longitudinal plan  of care) Evaluation of current treatment plan related to  self management and patient's adherence to plan as established by provider Assisted with arranging transportation to [place] on 08/07/22, pick [facility] at 11:45 am, return ride at 2 pm and [place] on 08/14/22, pick up from [place] at 12:15 pm and return ride at 2:30pm Provided patient with MTM transportation number [phone]   Diabetes:  (Status: Goal Not Met.) Long Term Goal   Lab Results  Component Value Date   HGBA1C 6.0 07/18/2021   @ Assessed patient's understanding of A1c goal: <7% Discussed plans with patient for ongoing care management follow up and provided patient with direct contact information for care management team;      Referral made to social work team for assistance with managing stress and procuring housing ;      Assessed social determinant of health barriers;        Advised patient to retrieve his glucometer from storage and begin checking BS Discussed the importance of medication compliance, advised patient to take medications as packaged from [facility]  Patient Goals/Self-Care  Activities: Attend all scheduled provider appointments Call provider office for new concerns or questions  Work with the social worker to address care coordination needs and will continue to work with the clinical team to address health care and disease management related needs Follow up with housing resources       Follow Up:  Patient agrees to Care Plan and Follow-up.  Plan: The Managed Medicaid care management team will reach out to the patient again over the next 30 days.  Date/time of next scheduled RN care management/care coordination outreach:  09/04/22 @ 10:30am  [name] RN, BSN [place] RN Care Coordinator

## 2022-08-05 DIAGNOSIS — Z419 Encounter for procedure for purposes other than remedying health state, unspecified: Secondary | ICD-10-CM | POA: Diagnosis not present

## 2022-08-06 ENCOUNTER — Telehealth: Payer: Self-pay | Admitting: *Deleted

## 2022-08-06 NOTE — Patient Outreach (Signed)
Care Coordination  08/06/2022  Scott Greer [birth year] [id number]   RNCM calling to remind patient of transportation details for his Orthopedic appointment on 08/07/22. Patient to be picked up at the [facility] at 11:45. Advised patient to keep his phone turned on, incase the transportation service needs to contact him. Patient voiced understanding.  [name] RN, BSN [place]  [employer] manager

## 2022-08-07 ENCOUNTER — Telehealth: Payer: Self-pay | Admitting: *Deleted

## 2022-08-07 ENCOUNTER — Ambulatory Visit (INDEPENDENT_AMBULATORY_CARE_PROVIDER_SITE_OTHER): Payer: Medicaid Other | Admitting: Orthopedic Surgery

## 2022-08-07 DIAGNOSIS — Z89512 Acquired absence of left leg below knee: Secondary | ICD-10-CM | POA: Diagnosis not present

## 2022-08-07 DIAGNOSIS — Z89511 Acquired absence of right leg below knee: Secondary | ICD-10-CM

## 2022-08-07 NOTE — Patient Outreach (Signed)
Care Coordination  08/07/2022  Scott Greer 05-Sep-1956 [id number]   RNCM received multiple voicemails from Scott Greer this morning expressing concern about arranged transportation to his appointment today. RNCM returned call to Scott Greer, he contacted [employer] transportation to confirm and was told that they had tried to contact him. [employer] had an old phone number, therefore was unable to reach patient. Transportation for today was then cancelled. RNCM and patient had updated all contact information when scheduling transportation. Scott Greer had reached out to Dr. Jess Barters office regarding transportation issue. Dr. Jess Barters office arranged a taxi to pick up Scott Greer for his appointment today. RNCM will reach out to [facility] regarding this issue with transportation.  [name] RN, BSN [place]  [employer] manager

## 2022-08-08 ENCOUNTER — Other Ambulatory Visit: Payer: Self-pay | Admitting: Critical Care Medicine

## 2022-08-08 DIAGNOSIS — E1121 Type 2 diabetes mellitus with diabetic nephropathy: Secondary | ICD-10-CM

## 2022-08-10 ENCOUNTER — Encounter: Payer: Self-pay | Admitting: *Deleted

## 2022-08-10 NOTE — Congregational Nurse Program (Signed)
Pillbox complete 

## 2022-08-13 ENCOUNTER — Telehealth: Payer: Self-pay | Admitting: *Deleted

## 2022-08-13 NOTE — Patient Outreach (Signed)
Care Coordination  08/13/2022  Scott Greer Nov 12, 1955 [id number]   RNCM attempted to reach [name] multiple times today to remind him of his appointment with Dr. Wynetta Emery on 08/14/22 and verify his transportation that we had previously arranged. Mr. Guard left a message on [facility] voicemail, stating that he was aware of the appointment, but concerned that transportation would not pick him up. RNCM will attempt again tomorrow to connect and verify transportation.  [name] RN, BSN [place]  [employer] manager

## 2022-08-14 ENCOUNTER — Ambulatory Visit: Payer: Medicaid Other | Attending: Internal Medicine | Admitting: Internal Medicine

## 2022-08-14 ENCOUNTER — Encounter: Payer: Self-pay | Admitting: Internal Medicine

## 2022-08-14 ENCOUNTER — Telehealth: Payer: Self-pay | Admitting: *Deleted

## 2022-08-14 ENCOUNTER — Encounter: Payer: Self-pay | Admitting: Orthopedic Surgery

## 2022-08-14 ENCOUNTER — Telehealth: Payer: Self-pay

## 2022-08-14 VITALS — BP 166/81 | HR 73 | Ht 75.0 in | Wt 240.0 lb

## 2022-08-14 DIAGNOSIS — I152 Hypertension secondary to endocrine disorders: Secondary | ICD-10-CM | POA: Diagnosis not present

## 2022-08-14 DIAGNOSIS — F17200 Nicotine dependence, unspecified, uncomplicated: Secondary | ICD-10-CM

## 2022-08-14 DIAGNOSIS — E1159 Type 2 diabetes mellitus with other circulatory complications: Secondary | ICD-10-CM

## 2022-08-14 DIAGNOSIS — I5042 Chronic combined systolic (congestive) and diastolic (congestive) heart failure: Secondary | ICD-10-CM

## 2022-08-14 DIAGNOSIS — E1121 Type 2 diabetes mellitus with diabetic nephropathy: Secondary | ICD-10-CM | POA: Diagnosis not present

## 2022-08-14 DIAGNOSIS — Z23 Encounter for immunization: Secondary | ICD-10-CM | POA: Diagnosis not present

## 2022-08-14 DIAGNOSIS — Z89512 Acquired absence of left leg below knee: Secondary | ICD-10-CM | POA: Diagnosis not present

## 2022-08-14 DIAGNOSIS — N521 Erectile dysfunction due to diseases classified elsewhere: Secondary | ICD-10-CM

## 2022-08-14 DIAGNOSIS — D638 Anemia in other chronic diseases classified elsewhere: Secondary | ICD-10-CM | POA: Diagnosis not present

## 2022-08-14 DIAGNOSIS — L84 Corns and callosities: Secondary | ICD-10-CM

## 2022-08-14 DIAGNOSIS — E782 Mixed hyperlipidemia: Secondary | ICD-10-CM

## 2022-08-14 DIAGNOSIS — E1169 Type 2 diabetes mellitus with other specified complication: Secondary | ICD-10-CM | POA: Diagnosis not present

## 2022-08-14 DIAGNOSIS — Z89511 Acquired absence of right leg below knee: Secondary | ICD-10-CM

## 2022-08-14 MED ORDER — ISOSORB DINITRATE-HYDRALAZINE 20-37.5 MG PO TABS: ORAL_TABLET | ORAL | 6 refills | Status: DC

## 2022-08-14 MED ORDER — METFORMIN HCL 500 MG PO TABS: 500.0000 mg | ORAL_TABLET | Freq: Two times a day (BID) | ORAL | 6 refills | Status: DC

## 2022-08-14 MED ORDER — EMPAGLIFLOZIN 10 MG PO TABS: ORAL_TABLET | ORAL | 6 refills | Status: DC

## 2022-08-14 MED ORDER — TORSEMIDE 20 MG PO TABS: ORAL_TABLET | ORAL | 1 refills | Status: DC

## 2022-08-14 MED ORDER — AMIODARONE HCL 200 MG PO TABS: 200.0000 mg | ORAL_TABLET | Freq: Every day | ORAL | 2 refills | Status: DC

## 2022-08-14 MED ORDER — ATORVASTATIN CALCIUM 10 MG PO TABS: 10.0000 mg | ORAL_TABLET | Freq: Every evening | ORAL | 6 refills | Status: DC

## 2022-08-14 MED ORDER — FREESTYLE LIBRE SENSOR SYSTEM MISC: 12 refills | Status: DC

## 2022-08-14 MED ORDER — ENTRESTO 24-26 MG PO TABS: 1.0000 | ORAL_TABLET | Freq: Two times a day (BID) | ORAL | 6 refills | Status: DC

## 2022-08-14 MED ORDER — FREESTYLE LIBRE 2 READER DEVI: 0 refills | Status: DC

## 2022-08-14 MED ORDER — SPIRONOLACTONE 25 MG PO TABS: ORAL_TABLET | ORAL | 6 refills | Status: DC

## 2022-08-14 MED ORDER — DIGOXIN 125 MCG PO TABS: ORAL_TABLET | ORAL | 6 refills | Status: DC

## 2022-08-14 MED ORDER — CARVEDILOL 3.125 MG PO TABS: 3.1250 mg | ORAL_TABLET | Freq: Two times a day (BID) | ORAL | 6 refills | Status: DC

## 2022-08-14 NOTE — Progress Notes (Signed)
Office Visit Note   Patient: Scott Greer           Date of Birth: June 09, 1956           MRN: [id number] Visit Date: 08/07/2022              Requested by: Thayer Headings, MD [address],  [place] [address] PCP: Thayer Headings, MD  Chief Complaint  Patient presents with   Right Leg - Follow-up      HPI: Patient is a 66 year old gentleman right transtibial amputee who has been subsiding into his socket and is having painful subsidence into his socket and difficulty with ADLs.  Assessment & Plan: Visit Diagnoses:  1. Hx of right BKA ([place])     Plan: Patient was provided a prescription for a new socket.  Follow-Up Instructions: Return if symptoms worsen or fail to improve.   Ortho Exam  Patient is alert, oriented, no adenopathy, well-dressed, normal affect, normal respiratory effort. Examination patient has tenderness to palpation over the residual limb.  He is wearing multiple ply socks and is still subsiding into his socket.  He has lost significant residual volume.  Patient is an existing right transtibial  amputee.  Patient's current comorbidities are not expected to impact the ability to function with the prescribed prosthesis. Patient verbally communicates a strong desire to use a prosthesis. Patient currently requires mobility aids to ambulate without a prosthesis.  Expects not to use mobility aids with a new prosthesis.  Patient is a K3 level ambulator that spends a lot of time walking around on uneven terrain over obstacles, up and down stairs, and ambulates with a variable cadence.     Imaging: No results found. No images are attached to the encounter.  Labs: Lab Results  Component Value Date   HGBA1C 6.0 07/18/2021   HGBA1C 6.7 (H) 04/15/2021   HGBA1C 9.1 (H) 02/08/2021   ESRSEDRATE 78 (H) 06/04/2019   CRP 4.5 (H) 06/04/2019   REPTSTATUS 02/11/2021 FINAL 02/06/2021   REPTSTATUS 02/11/2021 FINAL 02/06/2021   CULT   02/06/2021    NO GROWTH 5 DAYS Performed at [facility], [place] [address] [place], [place] [address]    CULT  02/06/2021    NO GROWTH 5 DAYS Performed at [facility] [address] [place], [place] [address]      Lab Results  Component Value Date   ALBUMIN 2.8 (L) 02/20/2022   ALBUMIN 2.8 (L) 05/05/2021   ALBUMIN 3.8 04/07/2021   PREALBUMIN 13.8 (L) 06/04/2019    Lab Results  Component Value Date   MG 1.8 02/07/2021   MG 2.0 01/05/2021   MG 2.1 01/04/2021   Lab Results  Component Value Date   VD25OH 9.2 (L) 12/14/2020    Lab Results  Component Value Date   PREALBUMIN 13.8 (L) 06/04/2019      Latest Ref Rng & Units 03/14/2022    2:45 AM 02/20/2022    3:42 AM 07/18/2021   11:34 AM  CBC EXTENDED  WBC 4.0 - 10.5 K/uL 6.0  4.7  7.6   RBC 4.22 - 5.81 MIL/uL 3.84  3.82  4.12   Hemoglobin 13.0 - 17.0 g/dL 11.4  11.1  11.6   HCT 39.0 - 52.0 % 33.9  34.1  34.0   Platelets 150 - 400 K/uL 226  220  320   NEUT# 1.7 - 7.7 K/uL 4.1     Lymph# 0.7 - 4.0  K/uL 1.2        There is no height or weight on file to calculate BMI.  Orders:  No orders of the defined types were placed in this encounter.  No orders of the defined types were placed in this encounter.    Procedures: No procedures performed  Clinical Data: No additional findings.  ROS:  All other systems negative, except as noted in the HPI. Review of Systems  Objective: Vital Signs: There were no vitals taken for this visit.  Specialty Comments:  No specialty comments available.  PMFS History: Patient Active Problem List   Diagnosis Date Noted   Chronic combined systolic and diastolic CHF (congestive heart failure) ([place]) 12/01/2021   S/P BKA (below knee amputation) bilateral ([place]) 07/18/2021   Acute hematogenous osteomyelitis of right foot ([place]) 04/26/2021   Gangrene of right foot (HCC)    Foot pain, right 04/14/2021   Subacute osteomyelitis, right ankle and foot ([place])    Status post  below-knee amputation ([place]) 02/17/2021   Wound dehiscence    Gangrene of toe of left foot (H[facility])    Cutaneous abscess of left foot    Left foot infection 02/07/2021   Leukocytosis 02/07/2021   Thrombocytosis 02/07/2021   Hyponatremia 02/07/2021   AKI (acute kidney injury) ([place]) 02/07/2021   Hyperglycemia due to diabetes mellitus ([place]) 02/07/2021   Hypoalbuminemia due to protein-calorie malnutrition ([place]) 02/07/2021   Osteomyelitis of second toe of left foot ([place])    New onset of congestive heart failure ([place]) 12/26/2020   CKD (chronic kidney disease) stage 3, GFR 30-59 ml/min (H[facility]) [id number]   Acute systolic CHF (congestive heart failure) ([place]) 12/26/2020   Anemia, chronic disease 06/22/2020   Amputee, great toe, right ([place]) 06/20/2020   Normocytic anemia 06/20/2020   Erectile dysfunction associated with type 2 diabetes mellitus ([place]) 06/20/2020   Positive for macroalbuminuria 10/24/2019   Amputation of left great toe ([place]) 10/23/2019   Tobacco dependence 10/23/2019   Osteomyelitis of great toe of right foot ([place]) 06/04/2019   Diabetic foot infection ([place])    Noncompliance    Glaucoma    Hyperlipidemia    Essential hypertension 11/06/2003   Diabetes mellitus ([place]) 11/05/1997   Past Medical History:  Diagnosis Date   CHF (congestive heart failure) ([place])    Dehiscence of amputation stump ([place])    left great toe   Diabetes mellitus without complication ([place]) [id number]   Type II   Glaucoma 2015   Hyperlipidemia    Hypertension 2005   Left foot infection 02/07/2021   Osteomyelitis ([place])    Wears glasses     Family History  Problem Relation Age of Onset   Stroke Mother    Diabetes Mother        Toward end of life    Past Surgical History:  Procedure Laterality Date   AMPUTATION Left 06/05/2019   Procedure: LEFT GREAT TOE AMPUTATION;  Surgeon: Newt Minion, MD;  Location: [facility];  Service: Orthopedics;  Laterality: Left;   AMPUTATION Left 07/10/2019   Procedure: LEFT FOOT 1ST  RAY AMPUTATION;  Surgeon: Newt Minion, MD;  Location: [facility];  Service: Orthopedics;  Laterality: Left;   AMPUTATION Right 05/25/2020   Procedure: RIGHT GREAT TOE AMPUTATION;  Surgeon: Newt Minion, MD;  Location: [facility];  Service: Orthopedics;  Laterality: Right;   AMPUTATION Left 01/25/2021   Procedure: LEFT 2ND TOE AMPUTATION;  Surgeon: Newt Minion, MD;  Location: [facility];  Service: Orthopedics;  Laterality: Left;  AMPUTATION Left 02/08/2021   Procedure: LEFT TRANSMETATARSAL AMPUTATION;  Surgeon: Newt Minion, MD;  Location: [facility];  Service: Orthopedics;  Laterality: Left;   AMPUTATION Left 02/10/2021   Procedure: AMPUTATION BELOW KNEE;  Surgeon: Newt Minion, MD;  Location: [facility];  Service: Orthopedics;  Laterality: Left;   AMPUTATION Right 04/14/2021   Procedure: RIGHT 1ST AND 2ND RAY AMPUTATION;  Surgeon: Newt Minion, MD;  Location: [facility];  Service: Orthopedics;  Laterality: Right;   AMPUTATION Right 04/26/2021   Procedure: RIGHT BELOW KNEE AMPUTATION;  Surgeon: Newt Minion, MD;  Location: [facility];  Service: Orthopedics;  Laterality: Right;   NO PAST SURGERIES     RIGHT/LEFT HEART CATH AND CORONARY ANGIOGRAPHY N/A 01/02/2021   Procedure: RIGHT/LEFT HEART CATH AND CORONARY ANGIOGRAPHY;  Surgeon: Lorretta Harp, MD;  Location: [facility];  Service: Cardiovascular;  Laterality: N/A;   Social History   Occupational History   Not on file  Tobacco Use   Smoking status: Every Day    Types: Cigars   Smokeless tobacco: Former    Types: Chew   Tobacco comments:    8 daily  Vaping Use   Vaping Use: Never used  Substance and Sexual Activity   Alcohol use: Yes    Alcohol/week: 11.0 standard drinks of alcohol    Types: 3 Cans of beer, 8 Shots of liquor per week    Comment: occasional   Drug use: Yes    Types: Marijuana    Comment: ocassional - last time 05/08/2020   Sexual activity: Not Currently

## 2022-08-14 NOTE — Patient Instructions (Signed)
Please make sure that the patient gets the medication called isosorbide/hydralazine 3 times a day as prescribed.

## 2022-08-14 NOTE — Patient Outreach (Signed)
Care Coordination  08/14/2022  Scott Greer 11-Nov-1955 [id number]   RNCM received call from [name]. A conference call was made to [employer] transportation [phone] to verifiy transportation that was arranged on 08/02/22. A lift Lucianne Lei is not available today. RNCM expressed the importance of transportation to [name] PCP appointment. [employer] arranged for an SUV to pick up [name] from the [employer] to his appointment today with a return ride. [name] will be ready for pick up by 12:15pm.  RNCM received email from [facility] with [employer] requesting forms for [name] to be completed by PCP. Email forwarded to PCP Care Coordinator, Opal Sidles. A message was also sent to Dr. Wynetta Emery.  RNCM will follow up with [name] at next scheduled visit.  [name] RN, BSN [place]  [employer] manager

## 2022-08-14 NOTE — Telephone Encounter (Addendum)
I met with the patient today and completed part B of [employer] [place] application. It was then faxed to [employer] [place] [employer].   He was inquiring about a power wheelchair as he has developed blisters on his hands from using his manual wheelchair. This was request was discussed with Dr Wynetta Emery. He did not have a preference for DME companies. I explained to him that we need to find a company that is in network with his insurance. I also informed him that this process of ordering and receiving approval from the insurance company can take months.   He also inquired about housing resources.  We discussed the fact that multiple SWs, caseworkers have been working with him to address his housing needs for over a year and I really don't have additional resources.  His income is about $625/month and he thinks he can afford up to $500/month.  He said he can make phone calls and visit places that are available.  I provided him with the listing of available apartments in [place] with rent up to $500/month from [email or web address]
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
## 2022-08-15 ENCOUNTER — Other Ambulatory Visit: Payer: Self-pay | Admitting: Internal Medicine

## 2022-08-15 ENCOUNTER — Telehealth: Payer: Self-pay | Admitting: Critical Care Medicine

## 2022-08-15 ENCOUNTER — Encounter: Payer: Self-pay | Admitting: Physician Assistant

## 2022-08-15 DIAGNOSIS — Z89511 Acquired absence of right leg below knee: Secondary | ICD-10-CM

## 2022-08-15 DIAGNOSIS — E1121 Type 2 diabetes mellitus with diabetic nephropathy: Secondary | ICD-10-CM

## 2022-08-15 DIAGNOSIS — D638 Anemia in other chronic diseases classified elsewhere: Secondary | ICD-10-CM

## 2022-08-15 LAB — COMPREHENSIVE METABOLIC PANEL
ALT: 8 IU/L (ref 0–44)
AST: 10 IU/L (ref 0–40)
Albumin/Globulin Ratio: 1.7 (ref 1.2–2.2)
Albumin: 4.1 g/dL (ref 3.9–4.9)
Alkaline Phosphatase: 51 IU/L (ref 44–121)
BUN/Creatinine Ratio: 13 (ref 10–24)
BUN: 21 mg/dL (ref 8–27)
Bilirubin Total: 0.4 mg/dL (ref 0.0–1.2)
CO2: 27 mmol/L (ref 20–29)
Calcium: 9.1 mg/dL (ref 8.6–10.2)
Chloride: 103 mmol/L (ref 96–106)
Creatinine, Ser: 1.64 mg/dL — ABNORMAL HIGH (ref 0.76–1.27)
Globulin, Total: 2.4 g/dL (ref 1.5–4.5)
Glucose: 208 mg/dL — ABNORMAL HIGH (ref 70–99)
Potassium: 4.3 mmol/L (ref 3.5–5.2)
Sodium: 144 mmol/L (ref 134–144)
Total Protein: 6.5 g/dL (ref 6.0–8.5)
eGFR: 47 mL/min/{1.73_m2} — ABNORMAL LOW (ref >59)

## 2022-08-15 LAB — CBC
Hematocrit: 32.2 % — ABNORMAL LOW (ref 37.5–51.0)
Hemoglobin: 10.5 g/dL — ABNORMAL LOW (ref 13.0–17.7)
MCH: 27.9 pg (ref 26.6–33.0)
MCHC: 32.6 g/dL (ref 31.5–35.7)
MCV: 85 fL (ref 79–97)
Platelets: 269 10*3/uL (ref 150–450)
RBC: 3.77 x10E6/uL — ABNORMAL LOW (ref 4.14–5.80)
RDW: 14.8 % (ref 11.6–15.4)
WBC: 6.3 10*3/uL (ref 3.4–10.8)

## 2022-08-15 LAB — LIPID PANEL
Chol/HDL Ratio: 2.6 ratio (ref 0.0–5.0)
Cholesterol, Total: 157 mg/dL (ref 100–199)
HDL: 60 mg/dL (ref >39)
LDL Chol Calc (NIH): 71 mg/dL (ref 0–99)
Triglycerides: 153 mg/dL — ABNORMAL HIGH (ref 0–149)
VLDL Cholesterol Cal: 26 mg/dL (ref 5–40)

## 2022-08-15 LAB — IRON,TIBC AND FERRITIN PANEL
Ferritin: 57 ng/mL (ref 30–400)
Iron Saturation: 15 % (ref 15–55)
Iron: 46 ug/dL (ref 38–169)
Total Iron Binding Capacity: 300 ug/dL (ref 250–450)
UIBC: 254 ug/dL (ref 111–343)

## 2022-08-15 MED ORDER — FERROUS SULFATE 325 (65 FE) MG PO TABS: ORAL_TABLET | ORAL | 1 refills | Status: DC

## 2022-08-15 MED ORDER — METFORMIN HCL 500 MG PO TABS: 500.0000 mg | ORAL_TABLET | Freq: Every day | ORAL | 6 refills | Status: DC

## 2022-08-15 NOTE — Progress Notes (Addendum)
Pt seen by Dr Joya Gaskins.  He wants a motorized wheelchair. He has problems with his current wheelchair, it is not working that well.   We are ordering gloves.  Was seen by Dr Wynetta Emery yesterday.   Today's Vitals   08/15/22 1651  BP: (!) 210/98  Pulse: 79  SpO2: 98%   There is no height or weight on file to calculate BMI.  He may be able to get a new wheelchair until he can get a motorized wheelchair.   He had infections in his legs, that is what caused the amputations.  He will get the gloves tomorrow.   Rosaria Ferries, PA-C 08/15/2022 4:57 PM   Order placed for new wheelchair manual [employer] [facility]

## 2022-08-15 NOTE — Progress Notes (Signed)
Let patient know that he is still anemic and a little worse compared to earlier this year.  Please make sure that he is getting the iron tablet called ferrous sulfate and taking it once a day.  His cholesterol level is good. Kidney function is not 100% and a little worse than before.  I want him to cut back on the metformin from taking it twice a day to just once a day.  Please call pt's [place] and let them know that I have decreased metformin to 500 mg once a day.  Also please find out if they are putting the ferrous sulfate which is the iron supplement in his blister packs.

## 2022-08-15 NOTE — Telephone Encounter (Signed)
This pt I saw at shelter today.  His wheelchair is falling apart can we not order manual wheelchair until he gets the motorized if it is approved  I just put order in for this

## 2022-08-16 ENCOUNTER — Encounter: Payer: Self-pay | Admitting: *Deleted

## 2022-08-16 ENCOUNTER — Telehealth: Payer: Self-pay

## 2022-08-16 LAB — POCT GLYCOSYLATED HEMOGLOBIN (HGB A1C): HbA1c, POC (controlled diabetic range): 6.9 % (ref 0.0–7.0)

## 2022-08-16 NOTE — Telephone Encounter (Signed)
Order for power wheelchair faxed to [place].  Andria Rhein notified that the order has been sent and to advise [place] regarding next steps.

## 2022-08-16 NOTE — Congregational Nurse Program (Signed)
[facility] used. It is difficult for pt to navigate their packaging system and also difficult and time consuming for clinic nurse to prepare a pillbox for pt. He is only 1/2 compliant taking meds. Last week he took 4 am meds and 2 pm meds. Dr Wynetta Emery please consider changing pt to [facility] on [address] or pharm in [place] building, thank you

## 2022-08-16 NOTE — Telephone Encounter (Signed)
I will see if [facility] can get him a manual wheelchair, can we see if Dr Wynetta Emery can start process for [facility]?

## 2022-08-16 NOTE — Congregational Nurse Program (Signed)
[facility] used. It is difficult for pt to navigate their packaging system and also difficult and time consuming for clinic nurse to prepare a pillbox for pt. He is only 1/2 compliant taking meds. Last week he took 4 am meds and 2 pm meds. Dr johnson please consider changing pt to [facility] on [address] or pharm in [facility] building, thank you 

## 2022-08-16 NOTE — Telephone Encounter (Signed)
FYI

## 2022-08-16 NOTE — Telephone Encounter (Signed)
I faxed the order for the power wheelchair to [facility] today

## 2022-08-16 NOTE — Telephone Encounter (Signed)
Thank you :)

## 2022-08-21 ENCOUNTER — Other Ambulatory Visit: Payer: Self-pay

## 2022-08-21 ENCOUNTER — Telehealth: Payer: Self-pay | Admitting: *Deleted

## 2022-08-21 NOTE — Patient Outreach (Signed)
Care Coordination  08/21/2022  Scott Greer 08/04/1956 [id number]   RNCM returning call to Scott Greer. He expresses concern regarding transportation to appointment to [facility] for prosthetic repair on 09/04/22 at [place] explained to Scott Greer that [facility] will provide transportation to this appointment. He has contacted [facility] and arranged transportation. RNCM advised patient to contact [facility] 1 day before the appointment to verify arranged transportation. Scott Greer voiced understanding.  [name] RN, BSN [place]  [employer] manager

## 2022-08-23 ENCOUNTER — Telehealth: Payer: Self-pay | Admitting: *Deleted

## 2022-08-23 NOTE — Telephone Encounter (Signed)
Patient was contacted in regards to his lab results.  Currently at the [place] residing. He shares that he is voiding an awful lot and wishes that he could get inside the shelter to use the restroom. He uses a wheelchair and cant make it around the building a lot of times and end up soiling his pants when his depend is too full.  Are there any medications that can be adjusted?  Also please view encounter from [place] dated on 08/16/2022 in reference to his medication.   Are there any considerations that can be in place and to who do they need to go to?    Please advise.

## 2022-08-28 NOTE — Congregational Nurse Program (Signed)
  Dept: [phone]   Congregational Nurse Program Note  Date of Encounter: 08/28/2022  Clinic visit for questions regarding medications, stated he had not had morning medicine because pillbox was not filled.  Medicines were in blister package from pharmacy and dosage for 10/24 AM were still in package.  Given morning medicines at 1210P and will not be able to take noontime medicines. Left note for Wednesday clinic nurse regarding medicines. Past Medical History: Past Medical History:  Diagnosis Date   CHF (congestive heart failure) ([facility])    Dehiscence of amputation stump ([place])    left great toe   Diabetes mellitus without complication ([place]) 5397   Type II   Glaucoma 2015   Hyperlipidemia    Hypertension 2005   Left foot infection 02/07/2021   Osteomyelitis ([place])    Wears glasses     Encounter Details:  CNP Questionnaire - 08/28/22 1155       Questionnaire   Ask client: Do you give verbal consent for me to treat you today? Yes    Student Assistance N/A    Location Patient [facility]    Visit Setting with Client Organization    Patient Status Unhoused    Insurance Medicaid    Insurance/Financial Assistance Referral N/A    Medication N/A    Medical Provider Yes    Screening Referrals Made N/A    Medical Referrals Made N/A    Medical Appointment Made N/A    Recently w/o PCP, now 1st time PCP visit completed due to CNs referral or appointment made N/A    Food Have Food Insecurities    Transportation Need transportation assistance    Housing/Utilities No permanent housing    Interpersonal Safety Do not feel safe at current residence    Interventions Educate;Counsel;Advocate/Support    Abnormal to Normal Screening Since Last CN Visit N/A    Screenings CN Performed N/A    Sent Client to Lab for: N/A    Did client attend any of the following based off CNs referral or appointments made? N/A    ED Visit Averted N/A    Life-Saving Intervention Made N/A

## 2022-08-29 ENCOUNTER — Other Ambulatory Visit (HOSPITAL_COMMUNITY): Payer: Self-pay | Admitting: Cardiology

## 2022-09-04 ENCOUNTER — Other Ambulatory Visit: Payer: Self-pay | Admitting: *Deleted

## 2022-09-04 NOTE — Patient Instructions (Signed)
Visit Information  Scott Greer was given information about Medicaid Managed Care team care coordination services as a part of their [facility] Medicaid benefit. Scott Greer verbally consented to engagement with the [facility] Managed Care team.   If you are experiencing a medical emergency, please call 911 or report to your local emergency department or urgent care.   If you have a non-emergency medical problem during routine business hours, please contact your provider's office and ask to speak with a nurse.   For questions related to your [facility] health plan, please call: [phone] or go here:[email or web address]  If you would like to schedule transportation through your [facility] plan, please call the following number at least 2 days in advance of your appointment: [phone].  You can also use the MTM portal or MTM mobile app to manage your rides. For the portal, please go to [email or web address].  Call the [place] at [phone], at any time, 24 hours a day, 7 days a week. If you are in danger or need immediate medical attention call 911.  If you would like help to quit smoking, call 1-800-QUIT-NOW (817)827-9424) OR Espaol: 1-855-Djelo-Ya (6-644-034-7425) o para ms informacin haga clic aqu or Text READY to 200-400 to register via text  [name],   Please see education materials related to DM provided as print materials.   The patient verbalized understanding of instructions, educational materials, and care plan provided today and DECLINED offer to receive copy of patient instructions, educational materials, and care plan.   Telephone follow up appointment with Managed Medicaid care management team member scheduled for:10/08/22 @ 10:30am  Scott Joiner RN, BSN [place] RN Care Coordinator   Following is a copy of your plan of care:  Care Plan : [place] of Care  Updates made by [name], RN since 09/04/2022 12:00 AM     Problem: Health Management needs related to DMII      Long-Range Goal: Development of Plan of Care to address Health Management needs related to DMII   Start Date: 02/27/2022  Expected End Date: 11/02/2022  Priority: High  Note:   Current Barriers:  Care Coordination needs related to Housing barriers  Chronic Disease Management support and education needs related to DMII, Homelessness, and Stress  Scott Greer continues to stay at [employer]. A one bedroom apartment has been located and he will be able to move in November. He expresses concern regarding finances with having a home.   RNCM Clinical Goal(s):  Patient will verbalize understanding of plan for management of DMII as evidenced by patient report continue to work with RN Care Manager and/or Social Worker to address care management and care coordination needs related to Homelessness and managing stress as evidenced by adherence to CM Team Scheduled appointments     through collaboration with RN Care manager, provider, and care team.   Interventions: Inter-disciplinary care team collaboration (see longitudinal plan of care) Evaluation of current treatment plan related to  self management and patient's adherence to plan as established by provider Rescheduled with Scott Greer, BSW on 09/10/22 @ 9am Advised patient to answer his phone Advised patient to set an alarm to remind himself to use the bathroom regularly during the day to avoid urgency and incontinence   Diabetes:  (Status: Goal on Track (progressing): YES.) Long Term Goal   Lab Results  Component Value Date   HGBA1C 6.9 08/16/2022   @ Assessed patient's understanding of  A1c goal: <7% Discussed plans with patient for ongoing care management follow up and provided patient with direct contact information for care management team;      Referral made to social work team for assistance with managing stress and procuring housing ;       Assessed social determinant of health barriers;        Patient taking metformin once daily Discussed diabetic diet Reviewed upcoming appointment including: [place] 09/04/22 @ 1:30pm-transportation verified and 09/24/22 @ 2:30 with PCP-advised to arrange transportation  Patient Goals/Self-Care Activities: Attend all scheduled provider appointments Call provider office for new concerns or questions  Work with the social worker to address care coordination needs and will continue to work with the clinical team to address health care and disease management related needs Follow up with housing resources

## 2022-09-04 NOTE — Patient Outreach (Signed)
Medicaid Managed Care   Nurse Care Manager Note  09/04/2022 Name:  Scott Greer MRN:  [id number] DOB:  1955-11-19  Scott Greer is an 66 y.o. year old male who is a primary patient of Scott Pier, MD.  The [facility] Managed Care Coordination team was consulted for assistance with:    DMII Homelessness  Scott Greer was given information about Medicaid Managed Care Coordination team services today. Scott Greer Patient agreed to services and verbal consent obtained.  Engaged with patient by telephone for follow up visit in response to provider referral for case management and/or care coordination services.   Assessments/Interventions:  Review of past medical history, allergies, medications, health status, including review of consultants reports, laboratory and other test data, was performed as part of comprehensive evaluation and provision of chronic care management services.  SDOH (Social Determinants of Health) assessments and interventions performed: SDOH Interventions    Flowsheet Row Patient Outreach Telephone from 06/26/2022 in [place] Patient Outreach Telephone from 04/16/2022 in [place] Patient Outreach Telephone from 03/06/2022 in [place] Patient Outreach Telephone from 02/27/2022 in [place] [employer] Telephone from 02/19/2022 in [place] Patient Outreach Telephone from 12/11/2021 in [place] Coordination  SDOH Interventions        Food Insecurity Interventions -- -- -- -- -- Intervention Not Indicated  [Patient receiving 3 meals a day.]  Housing Interventions Other (Comment)  [Patient is currently homeless. RNCM collaborated with [employer]. Scott Greer will follow up this afternoon after contacting [facility].  Scott Greer with Social Services with APS is now involved] Intervention Not Indicated  [Patient working with [employer] and an agent Scott Greer to find housing. Currently living in [place].] -- -- Other (Comment)  [[facility]H and [place] follow up] --  Transportation Interventions -- Other (Comment)  [Provided with [employer] transportation [phone] Other (Comment)  [Provided with [employer] [phone] -- -- --  Physical Activity Interventions -- -- -- -- -- Intervention Not Indicated  [Patient scheduled to attend PT to improve mobility.]  Stress Interventions -- -- -- Other (Comment)  [Referral to LCSW] -- --       Care Plan  Allergies  Allergen Reactions   Bee Venom Hives, Itching and Swelling    Medications Reviewed Today     Reviewed by [name], RN (Registered Nurse) on 09/04/22 at 1034  Med List Status: <None>   Medication Order Taking? Sig Documenting Provider Last Dose Status Informant  Accu-Chek Softclix Lancets lancets [id number] No Use to test blood sugars up to 4 times daily as directed. [name], MD Taking Active   amiodarone (PACERONE) 200 MG tablet [id number]  Take 1 tablet (200 mg total) by mouth daily. Scott Pier, MD  Active   ASPIRIN LOW DOSE 81 MG chewable tablet [id number]  CHEW ONE TABLET BY MOUTH ONCE DAILY Needs appointment for further refills [name], MD  Active   atorvastatin (LIPITOR) 10 MG tablet [id number]  Take 1 tablet (10 mg total) by mouth every evening. Scott Pier, MD  Active   Blood Glucose Monitoring Suppl (ACCU-CHEK GUIDE) w/Device KIT [id number] No Use to test blood sugars up to 4 times daily as directed. [name], MD Taking Active   Blood Glucose Monitoring Suppl (TRUE METRIX METER) w/Device KIT [id number] No Use as directed Karle Plumber  B, MD Taking Active Other  carvedilol (COREG) 3.125 MG tablet [id number]  Take 1 tablet (3.125 mg total) by mouth 2 (two) times daily with  a meal. Scott Pier, MD  Active   Continuous Blood Gluc Receiver (FREESTYLE LIBRE 2 READER) DEVI [id number]  UAD Scott Pier, MD  Active   Continuous Blood Gluc Sensor (FREESTYLE LIBRE SENSOR SYSTEM) MISC [id number]  Change sensor Q 2 wks Scott Pier, MD  Active   digoxin (LANOXIN) 0.125 MG tablet [id number]  TAKE ONE TABLET BY MOUTH EVERY EVENING WITH SUPPER Scott Pier, MD  Active   empagliflozin (JARDIANCE) 10 MG TABS tablet [id number]  TAKE ONE TABLET BY MOUTH AT [place] Bil[place], Dalbert Batman, MD  Active   ferrous sulfate (FEROSUL) 325 (65 FE) MG tablet [id number]  TAKE ONE TABLET BY MOUTH EVERY MORNING WITH BREAKFAST Scott Pier, MD  Active   gabapentin (NEURONTIN) 300 MG capsule [id number] No TAKE ONE CAPSULE BY MOUTH THREE TIMES DAILY Scott Pier, MD Taking Active   glucose blood (ACCU-CHEK GUIDE) test strip [id number] No Use to test blood sugars up to 4 times daily as directed. [name], MD Taking Active   isosorbide-hydrALAZINE (BIDIL) 20-37.5 MG tablet [id number]  TAKE ONE TABLET BY MOUTH THREE TIMES DAILY Scott Pier, MD  Active   metFORMIN (GLUCOPHAGE) 500 MG tablet [id number]  Take 1 tablet (500 mg total) by mouth daily with breakfast. Scott Pier, MD  Active   nutrition supplement, Fanny Dance [facility]) PACK [id number] No Take 1 packet by mouth 2 (two) times daily between meals. Persons, [name], PA Taking Active   sacubitril-valsartan (ENTRESTO) 24-26 MG [id number]  Take 1 tablet by mouth 2 (two) times daily. Absolute last refill without office visit please call [phone] to schedule Scott Pier, MD  Active   spironolactone (ALDACTONE) 25 MG tablet [id number]  TAKE ONE TABLET BY MOUTH EVERY MORNING Scott Pier, MD  Active   torsemide (DEMADEX) 20 MG tablet [id number]  TAKE ONE TABLET BY MOUTH ONCE DAILY Scott Pier, MD  Active   Vitamin D, Ergocalciferol, (DRISDOL) 1.25 MG (50000 UNIT) CAPS capsule [id number] No TAKE ONE  CAPSULE BY MOUTH ONCE WEEKLY ON Jetty Peeks, MD Taking Active   zinc sulfate 220 (50 Zn) MG capsule [id number] No Take 1 capsule (220 mg total) by mouth daily. Persons, [name], [name] Taking Active             Patient Active Problem List   Diagnosis Date Noted   Chronic combined systolic and diastolic CHF (congestive heart failure) ([place]) 12/01/2021   S/P BKA (below knee amputation) bilateral ([place]) 07/18/2021   Gangrene of right foot (HCC)    Foot pain, right 04/14/2021   Status post below-knee amputation ([place]) 02/17/2021   Wound dehiscence    Gangrene of toe of left foot (HCC)    Cutaneous abscess of left foot    Left foot infection 02/07/2021   Leukocytosis 02/07/2021   Thrombocytosis 02/07/2021   Hyponatremia 02/07/2021   AKI (acute kidney injury) ([place]) 02/07/2021   Hyperglycemia due to diabetes mellitus ([place]) 02/07/2021   New onset of congestive heart failure ([place]) 12/26/2020   CKD (chronic kidney disease) stage 3, GFR 30-59 ml/min (HCC) 65/46/5035   Acute systolic CHF (congestive heart failure) ([place]) 12/26/2020   Anemia, chronic disease 06/22/2020   Amputee, great toe, right ([place]) 06/20/2020   Normocytic anemia 06/20/2020   Erectile dysfunction associated with type 2 diabetes mellitus ([place]) 06/20/2020  Positive for macroalbuminuria 10/24/2019   Amputation of left great toe ([place]) 10/23/2019   Tobacco dependence 10/23/2019   Diabetic foot infection ([place])    Noncompliance    Glaucoma    Hyperlipidemia    Essential hypertension 11/06/2003   Diabetes mellitus ([place]) 11/05/1997    Conditions to be addressed/monitored per PCP order:  DMII and Homelessness  Care Plan : RN Care Manager Plan of Care  Updates made by [name], RN since 09/04/2022 12:00 AM     Problem: Health Management needs related to DMII      Long-Range Goal: Development of Plan of Care to address Health Management needs related to DMII   Start Date: 02/27/2022  Expected End Date:  11/02/2022  Priority: High  Note:   Current Barriers:  Care Coordination needs related to Housing barriers  Chronic Disease Management support and education needs related to DMII, Homelessness, and Stress  Mr. Gibeault continues to stay at [employer]. A one bedroom apartment has been located and he will be able to move in November. He expresses concern regarding finances with having a home.   RNCM Clinical Goal(s):  Patient will verbalize understanding of plan for management of DMII as evidenced by patient report continue to work with RN Care Manager and/or Social Worker to address care management and care coordination needs related to Homelessness and managing stress as evidenced by adherence to CM Team Scheduled appointments     through collaboration with RN Care manager, provider, and care team.   Interventions: Inter-disciplinary care team collaboration (see longitudinal plan of care) Evaluation of current treatment plan related to  self management and patient's adherence to plan as established by provider Rescheduled with [name], BSW on 09/10/22 @ 9am Advised patient to answer his phone Advised patient to set an alarm to remind himself to use the bathroom regularly during the day to avoid urgency and incontinence   Diabetes:  (Status: Goal on Track (progressing): YES.) Long Term Goal   Lab Results  Component Value Date   HGBA1C 6.9 08/16/2022   @ Assessed patient's understanding of A1c goal: <7% Discussed plans with patient for ongoing care management follow up and provided patient with direct contact information for care management team;      Referral made to social work team for assistance with managing stress and procuring housing ;      Assessed social determinant of health barriers;        Patient taking metformin once daily Discussed diabetic diet Reviewed upcoming appointment including: [place] 09/04/22 @ 1:30pm-transportation verified and 09/24/22 @ 2:30 with  PCP-advised to arrange transportation  Patient Goals/Self-Care Activities: Attend all scheduled provider appointments Call provider office for new concerns or questions  Work with the social worker to address care coordination needs and will continue to work with the clinical team to address health care and disease management related needs Follow up with housing resources       Follow Up:  Patient agrees to Care Plan and Follow-up.  Plan: The Managed Medicaid care management team will reach out to the patient again over the next 30 days.  Date/time of next scheduled RN care management/care coordination outreach:  10/08/22 @ 10:30am  [name] RN, BSN [place] RN Care Coordinator

## 2022-09-05 DIAGNOSIS — Z419 Encounter for procedure for purposes other than remedying health state, unspecified: Secondary | ICD-10-CM | POA: Diagnosis not present

## 2022-09-10 ENCOUNTER — Other Ambulatory Visit: Payer: Medicaid Other

## 2022-09-10 NOTE — Patient Instructions (Signed)
Visit Information  Scott Greer was given information about Medicaid Managed Care team care coordination services as a part of their [facility] Medicaid benefit. Scott Greer verbally consented to engagement with the [facility] Managed Care team.   If you are experiencing a medical emergency, please call 911 or report to your local emergency department or urgent care.   If you have a non-emergency medical problem during routine business hours, please contact your provider's office and ask to speak with a nurse.   For questions related to your [facility] health plan, please call: [phone] or go here:[email or web address]  If you would like to schedule transportation through your [facility] plan, please call the following number at least 2 days in advance of your appointment: [phone].  You can also use the MTM portal or MTM mobile app to manage your rides. For the portal, please go to [email or web address].  Call the [place] at [phone], at any time, 24 hours a day, 7 days a week. If you are in danger or need immediate medical attention call 911.  If you would like help to quit smoking, call 1-800-QUIT-NOW 240-271-4424) OR Espaol: 1-855-Djelo-Ya (4-401-027-2536) o para ms informacin haga clic aqu or Text READY to 200-400 to register via text  Scott Greer - following are the goals we discussed in your visit today:   Goals Addressed   None      Social Worker will follow up in 14 days .   Scott Greer, BSW, Scott Greer  High Risk Managed Medicaid Team  [phone]   Following is a copy of your plan of care:  Care Plan : General Pharmacy (Adult)  Updates made by Scott Greer since 09/10/2022 12:00 AM     Problem: Chronic Disease Management   Priority: High  Onset Date: 10/13/2021     Long-Range Goal: Managing Chronic Disease Therapies   Start Date: 10/13/2021  Expected End Date: 01/11/2022   Recent Progress: On track  Priority: High  Note:   Current Barriers:  Does not adhere to prescribed medication regimen Does not contact provider office for questions/concerns   Pharmacist Clinical Goal(s):  patient will adhere to prescribed medication regimen as evidenced by discuss with patient regarding adherence contact provider office for questions/concerns as evidenced notation of same in electronic health record through collaboration with PharmD and provider.    Interventions: Inter-disciplinary care team collaboration (see longitudinal plan of care) Comprehensive medication review performed; medication list updated in electronic medical record  Diabetes:  Controlled; current treatment: Metformin 500mg  twice daily, Empagliflozin 10mg   Current glucose readings: patient reports he does not check often, last checked around one month ago  Denies hypoglycemic/hyperglycemic symptoms  Educated on importance of checking blood glucose to assess effectiveness of therapy; recommended patient check 2-3x/week fasting  Hypertension:  Controlled; current treatment: Entresto 24-26mg , Carvedilol 3.125mg  twice daily, BIDIL 20-37.5mg  daily, spironolactone 25mg  daily  Current home readings: does not have blood pressure monitor  Denies hypotensive/hypertensive symptoms  Educated on ability to check blood pressure at [employer] or [place] for free  Tobacco Abuse:  8 [place]isher Cigars per day  Patient not interested in tobacco cessation at this time as he states "it is all I have"  Educated on importance of tobacco cessation and patient agreed to re-visit topic at next follow-up visit  Pain (below knee amputation)       Uncontrolled; current treatment: tylenol OTC       Patient reports last being  on Oxycodone 5mg  recently but that this was not sufficient at controlling his pain       Rates pain 5/10 today       Recommended patient discuss with Ortho at next follow-up appointment  Heart  Failure       Controlled; current treatment: Entresto 24-26mg , Carvedilol 3.125mg  twice daily, Torsemide 20mg  daily, BIDIL 20-37.5mg  daily, spironolactone 25mg  daily,  amiodarone 200mg , aspirin 81mg , Digoxin 0.125mg  daily       Recommended continued follow-up with cardiology BSW completed a telephone outreach with patient, he stated he caseworker told him an apartment was lined up for him, he called the place and found out they don't have anything for him. Patient states that he is tired of living on the street and it is hard for him being a double amputee. BSW will follow up with the disability advocacy team.  Patient Goals/Self-Care Activities patient will:  - take medications as prescribed as evidenced by patient report and record review check glucose 2-3x/week, document, and provide at future appointments  Follow Up Plan:  Telephone follow up appointment with care management team member scheduled for: 10/18/21 Care Manager; 11/24/21 Pharmacist Next PCP appointment scheduled for: 11/17/21 Next Cardiology appointment scheduled for: 11/28/21

## 2022-09-13 ENCOUNTER — Encounter: Payer: Self-pay | Admitting: *Deleted

## 2022-09-13 NOTE — Congregational Nurse Program (Signed)
Pillbox complete 

## 2022-09-19 ENCOUNTER — Telehealth: Payer: Self-pay

## 2022-09-19 ENCOUNTER — Encounter: Payer: Self-pay | Admitting: Physician Assistant

## 2022-09-19 ENCOUNTER — Encounter: Payer: Self-pay | Admitting: *Deleted
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
## 2022-09-19 NOTE — Telephone Encounter (Signed)
I spoke to Dr Delford Field as well as Leta Jungling, SW/[facility] and they recommended that the patient pursue the order for the power wheelchair, they have spoken with the patient and he is in agreement.  They understand that the patient's insurance will most likely not pay for new prosthetics and a power wheelchair.   Per Leta Jungling, the patient has prosthetics but is not using them and we are not sure if they fit appropriately    Debbie Roach/ [employer] has been notified to initiate the order for the PT power wheelchair seating eval.

## 2022-09-20 NOTE — Telephone Encounter (Signed)
Signed order for power wheelchair seating eval emailed to [facility]

## 2022-09-24 ENCOUNTER — Ambulatory Visit: Payer: Medicaid Other | Admitting: Internal Medicine

## 2022-10-05 DIAGNOSIS — Z419 Encounter for procedure for purposes other than remedying health state, unspecified: Secondary | ICD-10-CM | POA: Diagnosis not present

## 2022-10-08 ENCOUNTER — Other Ambulatory Visit: Payer: Medicaid Other | Admitting: *Deleted

## 2022-10-08 NOTE — Patient Instructions (Signed)
Visit Information  [name]  - as a part of your Medicaid benefit, you are eligible for care management and care coordination services at no cost or copay. I was unable to reach you by phone today but would be happy to help you with your health related needs. Please feel free to call me @ [phone].   A member of the Managed Medicaid care management team will reach out to you again over the next 14 days.   Estanislado Emms RN, BSN [place]  [facility]

## 2022-10-08 NOTE — Patient Outreach (Signed)
  Medicaid Managed Care   Unsuccessful Attempt Note   10/08/2022 Name: MOREY ANDONIAN MRN: [id number] DOB: Jun 26, 1956  Referred by: [name], MD Reason for referral : High Risk Managed Medicaid (Unsuccessful RNCM follow up telephone outreach)   An unsuccessful telephone outreach was attempted today. The patient was referred to the case management team for assistance with care management and care coordination.    Follow Up Plan: The Managed Medicaid care management team will reach out to the patient again over the next 14 days.    Estanislado Emms RN, BSN [place]  [facility]

## 2022-10-09 NOTE — Telephone Encounter (Signed)
Part B of [employer] application emailed to [email or web address] because she said she did not receive all 3 pages of part B.  I explained to her that Part A was submitted by his caseworker at [facility].

## 2022-10-09 NOTE — Telephone Encounter (Signed)
I returned the call to Cheree Ditto, RN/ [employer] and explained that the order for the power wheelchair seating eval was sent to [employer] about 2 weeks ago. He would not have received a power chair this quickly without a seating eval.   Lynden Ang said his phone is not working.  I am not sure if someone has been trying to reach him to schedule the seating eval.  Until he receives a new phone, Lynden Ang said that the best number to reach him would be through  Enid Skeens, case manager at [employer] [phone].   I sent this updated contact information to [facility]

## 2022-10-09 NOTE — Telephone Encounter (Signed)
Scott Greer from [employer] called in checking if Wheelchair is in. Please call back

## 2022-10-10 ENCOUNTER — Encounter: Payer: Self-pay | Admitting: Critical Care Medicine

## 2022-10-10 ENCOUNTER — Encounter: Payer: Self-pay | Admitting: Physician Assistant

## 2022-10-10 NOTE — Telephone Encounter (Signed)
Patient's new phone number as of today emailed to [employer]

## 2022-10-10 NOTE — Progress Notes (Signed)
Pt seen by Dr Joya Gaskins.  He wonders about his electric wheelchair.  His metformin was decreased to once a day because his renal function was worse.   The [employer] company is sending meds in pack, the pack should have metformin at once a day.   CBG 216 yesterday, 176 today.   He has decreased vision in R eye, has been told he has glaucoma, has not seen an eye doctor in about 4 years.   Today's Vitals   10/10/22 1430  BP: (!) 170/83  Pulse: 78  SpO2: 100%   There is no height or weight on file to calculate BMI.  BMET    Component Value Date/Time   NA 139 09/16/2022 2322   K 4.2 09/16/2022 2322   CL 109 09/16/2022 2322   CO2 26 09/16/2022 2315   GLUCOSE 108 (H) 09/16/2022 2322   BUN 16 09/16/2022 2322   CREATININE 1.10 09/16/2022 2322   CALCIUM 9.5 09/16/2022 2315   EGFR 74 07/31/2022 1509   GFRNONAA >60 09/16/2022 2315   He really wants the electric wheelchair. He dropped his gloves, before he could get turned around, someone took them.   He was supposed to f/u with Cardiology, but has not been able to do so.   Rosaria Ferries, PA-C 10/10/2022 2:35 PM

## 2022-10-11 NOTE — Progress Notes (Signed)
This patient was seen in the [place] shelter clinic and is a 66 year old male with multiple medical problems.  He is wheelchair-bound with bilateral below-knee amputations.  We are trying to get him through his primary care provider a motorized wheelchair.  He needs to have an assessment at the homecare company for the seat on the unit.  He has been approved for the unit.  An appointment has been made and he will go tomorrow for this appointment.  He has had increased calluses on his hands because of the wheelchair he is using now is difficult to navigate on hills because of the degree of heaviness of the chair.  He had gloss before but they were lost.  Also needs an eye exam he has history of glaucoma with decreased visual acuity 2 attempts been made for him to go to [facility] center however he has not been able to make appointments because his phone is broken and he was not able to receive calls he now has a new cell phone and we have the correct number in the chart  On exam blood sugar 176 eyes are somewhat protruding with erythema in the conjunctiva  Plan is to get him to the [facility] we have contact there and made a referral he will get his eyes examined glaucoma assess this is easier for him to get to and he will be awaiting a phone call for this appointment he also he will make his appointment for his wheelchair assessment for motorized wheelchair

## 2022-10-15 ENCOUNTER — Emergency Department (HOSPITAL_COMMUNITY): Payer: Medicaid Other

## 2022-10-15 ENCOUNTER — Emergency Department (HOSPITAL_COMMUNITY)
Admission: EM | Admit: 2022-10-15 | Discharge: 2022-10-16 | Disposition: A | Discharge status: Home / Self Care | Payer: Medicaid Other | Attending: Emergency Medicine | Admitting: Emergency Medicine

## 2022-10-15 DIAGNOSIS — R1111 Vomiting without nausea: Secondary | ICD-10-CM | POA: Diagnosis not present

## 2022-10-15 DIAGNOSIS — R9431 Abnormal electrocardiogram [ECG] [EKG]: Secondary | ICD-10-CM | POA: Diagnosis not present

## 2022-10-15 DIAGNOSIS — Z743 Need for continuous supervision: Secondary | ICD-10-CM | POA: Diagnosis not present

## 2022-10-15 DIAGNOSIS — J101 Influenza due to other identified influenza virus with other respiratory manifestations: Secondary | ICD-10-CM | POA: Diagnosis not present

## 2022-10-15 DIAGNOSIS — E1122 Type 2 diabetes mellitus with diabetic chronic kidney disease: Secondary | ICD-10-CM | POA: Diagnosis not present

## 2022-10-15 DIAGNOSIS — Z20822 Contact with and (suspected) exposure to covid-19: Secondary | ICD-10-CM | POA: Diagnosis not present

## 2022-10-15 DIAGNOSIS — Z7984 Long term (current) use of oral hypoglycemic drugs: Secondary | ICD-10-CM | POA: Insufficient documentation

## 2022-10-15 DIAGNOSIS — N189 Chronic kidney disease, unspecified: Secondary | ICD-10-CM | POA: Insufficient documentation

## 2022-10-15 DIAGNOSIS — D72819 Decreased white blood cell count, unspecified: Secondary | ICD-10-CM | POA: Diagnosis not present

## 2022-10-15 DIAGNOSIS — R059 Cough, unspecified: Secondary | ICD-10-CM | POA: Diagnosis not present

## 2022-10-15 DIAGNOSIS — R509 Fever, unspecified: Secondary | ICD-10-CM | POA: Diagnosis not present

## 2022-10-15 LAB — CBC WITH DIFFERENTIAL/PLATELET
Abs Immature Granulocytes: 0.01 10*3/uL (ref 0.00–0.07)
Basophils Absolute: 0 10*3/uL (ref 0.0–0.1)
Basophils Relative: 0 %
Eosinophils Absolute: 0 10*3/uL (ref 0.0–0.5)
Eosinophils Relative: 0 %
HCT: 32.5 % — ABNORMAL LOW (ref 39.0–52.0)
Hemoglobin: 10.7 g/dL — ABNORMAL LOW (ref 13.0–17.0)
Immature Granulocytes: 0 %
Lymphocytes Relative: 19 %
Lymphs Abs: 0.7 10*3/uL (ref 0.7–4.0)
MCH: 28.5 pg (ref 26.0–34.0)
MCHC: 32.9 g/dL (ref 30.0–36.0)
MCV: 86.7 fL (ref 80.0–100.0)
Monocytes Absolute: 0.9 10*3/uL (ref 0.1–1.0)
Monocytes Relative: 23 %
Neutro Abs: 2.2 10*3/uL (ref 1.7–7.7)
Neutrophils Relative %: 58 %
Platelets: 216 10*3/uL (ref 150–400)
RBC: 3.75 MIL/uL — ABNORMAL LOW (ref 4.22–5.81)
RDW: 15.4 % (ref 11.5–15.5)
WBC: 3.8 10*3/uL — ABNORMAL LOW (ref 4.0–10.5)
nRBC: 0 % (ref 0.0–0.2)

## 2022-10-15 LAB — COMPREHENSIVE METABOLIC PANEL
ALT: 15 U/L (ref 0–44)
AST: 36 U/L (ref 15–41)
Albumin: 3.7 g/dL (ref 3.5–5.0)
Alkaline Phosphatase: 39 U/L (ref 38–126)
Anion gap: 16 — ABNORMAL HIGH (ref 5–15)
BUN: 27 mg/dL — ABNORMAL HIGH (ref 8–23)
CO2: 20 mmol/L — ABNORMAL LOW (ref 22–32)
Calcium: 9 mg/dL (ref 8.9–10.3)
Chloride: 100 mmol/L (ref 98–111)
Creatinine, Ser: 1.94 mg/dL — ABNORMAL HIGH (ref 0.61–1.24)
GFR, Estimated: 38 mL/min — ABNORMAL LOW (ref >60)
Glucose, Bld: 102 mg/dL — ABNORMAL HIGH (ref 70–99)
Potassium: 3.6 mmol/L (ref 3.5–5.1)
Sodium: 136 mmol/L (ref 135–145)
Total Bilirubin: 0.8 mg/dL (ref 0.3–1.2)
Total Protein: 6.6 g/dL (ref 6.5–8.1)

## 2022-10-15 LAB — RESP PANEL BY RT-PCR (RSV, FLU A&B, COVID)  RVPGX2
Influenza A by PCR: POSITIVE — AB
Influenza B by PCR: NEGATIVE
Resp Syncytial Virus by PCR: NEGATIVE
SARS Coronavirus 2 by RT PCR: NEGATIVE

## 2022-10-15 LAB — CBG MONITORING, ED: Glucose-Capillary: 89 mg/dL (ref 70–99)

## 2022-10-15 MED ORDER — ACETAMINOPHEN 325 MG PO TABS
650.0000 mg | ORAL_TABLET | Freq: Once | ORAL | Status: AC
  Administered 2022-10-16: 650 mg via ORAL
  Filled 2022-10-15: qty 2

## 2022-10-15 NOTE — ED Triage Notes (Signed)
Pt BIB [facility] from [facility] d/t 2 days of having body aches, fever, cough, n/v & malaise that worsened yesterday. A/Ox4, no PIV upon arrival, EMS reports he refused cough med & Tylenol where he lives when he found out EMS was bringing him to hospital. Lung Sounds clear, 100% on RA, 180/104, 92 bpm, felt afebrile, CBG 108.

## 2022-10-15 NOTE — ED Provider Triage Note (Signed)
Emergency Medicine Provider Triage Evaluation Note  NAITHAN DELAGE , a 66 y.o. male  was evaluated in triage.  Pt complains of fatigue, fever, body aches, kidney painx 2-3 days..  Review of Systems  Positive: Fever, chills, vomiting Negative: Shortness of breath  Physical Exam  BP (!) 173/106   Pulse 81   Temp 99 F (37.2 C) (Oral)   Resp 16   SpO2 100%  Gen:   Awake, no distress   Resp:  Normal effort  MSK:   Moves extremities without difficulty  Other:    Medical Decision Making  Medically screening exam initiated at 9:28 PM.  Appropriate orders placed.  CHESKEL SILVERIO was informed that the remainder of the evaluation will be completed by another provider, this initial triage assessment does not replace that evaluation, and the importance of remaining in the ED until their evaluation is complete.     Pete Pelt, [name] 10/15/22 2129

## 2022-10-16 ENCOUNTER — Other Ambulatory Visit: Payer: Self-pay

## 2022-10-16 ENCOUNTER — Other Ambulatory Visit (HOSPITAL_COMMUNITY): Payer: Self-pay

## 2022-10-16 ENCOUNTER — Other Ambulatory Visit: Payer: Medicaid Other | Admitting: *Deleted

## 2022-10-16 MED ORDER — BENZONATATE 100 MG PO CAPS
200.0000 mg | ORAL_CAPSULE | Freq: Once | ORAL | Status: AC
  Administered 2022-10-16: 200 mg via ORAL
  Filled 2022-10-16: qty 2

## 2022-10-16 MED ORDER — BENZONATATE 100 MG PO CAPS
100.0000 mg | ORAL_CAPSULE | Freq: Three times a day (TID) | ORAL | 0 refills | Status: DC
  Filled 2022-10-17 (×2): qty 21, 7d supply, fill #0

## 2022-10-16 MED ORDER — OSELTAMIVIR PHOSPHATE 75 MG PO CAPS
75.0000 mg | ORAL_CAPSULE | Freq: Once | ORAL | Status: AC
  Administered 2022-10-16: 75 mg via ORAL
  Filled 2022-10-16: qty 1

## 2022-10-16 MED ORDER — OSELTAMIVIR PHOSPHATE 30 MG PO CAPS: 30.0000 mg | ORAL_CAPSULE | Freq: Once | ORAL | Status: DC

## 2022-10-16 MED ORDER — OSELTAMIVIR PHOSPHATE 30 MG PO CAPS
30.0000 mg | ORAL_CAPSULE | Freq: Two times a day (BID) | ORAL | 0 refills | Status: AC
  Filled 2022-10-17 (×2): qty 10, 5d supply, fill #0

## 2022-10-16 NOTE — Discharge Instructions (Addendum)
You were seen in the ER today and diagnosed with influenza.  The initial dose of Tamiflu was given while you are in the ER today as well as Tessalon Perles to help with the cough.  A prescription for Tamiflu was printed out for you to provide to a pharmacy of your choosing with the dosing accounting for kidney function.  I I recommend that you take the Tamiflu for the 5-day course if possible in order to reduce the severity of the illness and reduce risk of complications.  A prescription for Tessalon Perles for cough was also printed and provided due to take to a pharmacy of your choosing.  Take Tylenol for fevers as needed.

## 2022-10-16 NOTE — Patient Outreach (Signed)
  Medicaid Managed Care   Unsuccessful Attempt Note   10/16/2022 Name: Scott Greer MRN: [id number] DOB: October 05, 1956  Referred by: [name], MD Reason for referral : High Risk Managed Medicaid (Unsuccessful RNCM follow up outreach)   An unsuccessful telephone outreach was attempted today. The patient was referred to the case management team for assistance with care management and care coordination.    Follow Up Plan: A HIPAA compliant phone message was left for the patient providing contact information and requesting a return call. and The Managed Medicaid care management team will reach out to the patient again over the next 14 days.    Scott Emms RN, BSN [place]  [facility] [employer]

## 2022-10-16 NOTE — Progress Notes (Signed)
CSW contacted Enid Skeens, case manager at [employer] [phone]. CSW was told they can send a lift to the ED to pick-up patient. Leta Jungling stated she would call CSW back with the ETA and details.

## 2022-10-16 NOTE — ED Notes (Signed)
Pt resting quietly with eyes closed

## 2022-10-16 NOTE — Congregational Nurse Program (Signed)
  Dept: [phone]   Congregational Nurse Program Note  Date of Encounter: 10/16/2022  Clinic visit post discharge from [facility] ER with diagnosis of flu.  Complains of body aches and having difficulty sleeping.  Had written prescriptions that needed to be filled, [facility] to get [facility].  Reviewed medications and assisted with AM medications that were in pill pack from the [facility] pharmacy provider.  Made appointment for follow-up visit from Er with PCP office. Past Medical History: Past Medical History:  Diagnosis Date   CHF (congestive heart failure) (HCC)    Dehiscence of amputation stump (HCC)    left great toe   Diabetes mellitus without complication (HCC) 1999   Type II   Glaucoma 2015   Hyperlipidemia    Hypertension 2005   Left foot infection 02/07/2021   Osteomyelitis (HCC)    Wears glasses     Encounter Details:  CNP Questionnaire - 10/16/22 1145       Questionnaire   Ask client: Do you give verbal consent for me to treat you today? Yes    Student Assistance N/A    Location Patient Served  [facility]    Visit Setting with Client Organization    Patient Status Unhoused    Insurance Medicaid    Insurance/Financial Assistance Referral N/A    Medication N/A    Medical Provider Yes    Screening Referrals Made N/A    Medical Referrals Made [facility] PCP[facility]inic    Medical Appointment Made [facility] PCP/clinic    Recently w/o PCP, now 1st time PCP visit completed due to CNs referral or appointment made N/A    Food Have Food Insecurities    Transportation Need transportation assistance;Provided transportation assistance    Housing/Utilities No permanent housing    Interpersonal Safety Do not feel safe at current residence    Interventions Advocate/Support;Navigate Healthcare System;Case Management;Counsel;Educate    Abnormal to Normal Screening Since Last CN Visit N/A    Screenings CN Performed N/A    Sent Client to Lab  for: N/A    Did client attend any of the following based off CNs referral or appointments made? N/A    ED Visit Averted N/A    Life-Saving Intervention Made N/A

## 2022-10-16 NOTE — ED Provider Notes (Signed)
[facility] EMERGENCY DEPARTMENT Provider Note   CSN: [id number] Arrival date & time: 10/15/22  1730     History No chief complaint on file.   Scott Greer is a 66 y.o. male.  HPI Patient presents to the ER after 3 days of fever and general malaise.  Patient reports that he is currently homeless and began experiencing signs of illness approximately 3 days ago.  He denies any known sick contacts but he is currently residing in a homeless shelter and believes that there are many sick individuals around him.  Patient has a history of type 2 diabetes that is poorly controlled as well as chronic kidney disease.  Patient reports associated nausea and vomiting, myalgias, arthralgia, and headaches.  Denies any chest pain, shortness of breath, dizziness, lightheadedness.    Home Medications Prior to Admission medications   Medication Sig Start Date End Date Taking? Authorizing Provider  benzonatate (TESSALON) 100 MG capsule Take 1 capsule (100 mg total) by mouth every 8 (eight) hours. 10/16/22  Yes Luvenia Heller, PA-C  oseltamivir (TAMIFLU) 30 MG capsule Take 1 capsule (30 mg total) by mouth 2 (two) times daily for 5 days. 10/16/22 10/21/22 Yes Luvenia Heller, PA-C  Accu-Chek Softclix Lancets lancets Use to test blood sugars up to 4 times daily as directed. 03/19/22   [name], MD  amiodarone (PACERONE) 200 MG tablet Take 1 tablet (200 mg total) by mouth daily. 08/14/22   [name], MD  ASPIRIN LOW DOSE 81 MG chewable tablet CHEW ONE TABLET BY MOUTH ONCE DAILY Needs appointment for further refills 08/29/22   [name], MD  atorvastatin (LIPITOR) 10 MG tablet Take 1 tablet (10 mg total) by mouth every evening. 08/14/22   [name], MD  Blood Glucose Monitoring Suppl (ACCU-CHEK GUIDE) w/Device KIT Use to test blood sugars up to 4 times daily as directed. 03/19/22   [name], MD  Blood Glucose Monitoring Suppl (TRUE METRIX METER) w/Device  KIT Use as directed 10/23/19   [name], MD  carvedilol (COREG) 3.125 MG tablet Take 1 tablet (3.125 mg total) by mouth 2 (two) times daily with a meal. 08/14/22   [name], MD  Continuous Blood Gluc Receiver (FREESTYLE LIBRE 2 READER) DEVI UAD 08/14/22   [name], MD  Continuous Blood Gluc Sensor (FREESTYLE LIBRE SENSOR SYSTEM) MISC Change sensor Q 2 wks 08/14/22   [name], MD  digoxin (LANOXIN) 0.125 MG tablet TAKE ONE TABLET BY MOUTH EVERY EVENING WITH SUPPER 08/14/22   [name], MD  empagliflozin (JARDIANCE) 10 MG TABS tablet TAKE ONE TABLET BY MOUTH AT NOON 08/14/22   [name], MD  ferrous sulfate (FEROSUL) 325 (65 FE) MG tablet TAKE ONE TABLET BY MOUTH EVERY MORNING WITH BREAKFAST 08/15/22   [name], MD  gabapentin (NEURONTIN) 300 MG capsule TAKE ONE CAPSULE BY MOUTH THREE TIMES DAILY 07/23/22   [name], MD  glucose blood (ACCU-CHEK GUIDE) test strip Use to test blood sugars up to 4 times daily as directed. 03/19/22   [name], MD  isosorbide-hydrALAZINE (BIDIL) 20-37.5 MG tablet TAKE ONE TABLET BY MOUTH THREE TIMES DAILY 08/14/22   [name], MD  metFORMIN (GLUCOPHAGE) 500 MG tablet Take 1 tablet (500 mg total) by mouth daily with breakfast. 08/15/22   [name], MD  nutrition supplement, JUVEN, (JUVEN) PACK Take 1 packet by mouth 2 (two) times daily between meals. 04/17/21  Persons, [name], PA  sacubitril-valsartan (ENTRESTO) 24-26 MG Take 1 tablet by mouth 2 (two) times daily. Absolute last refill without office visit please call [phone] to schedule 08/14/22   [name], MD  spironolactone (ALDACTONE) 25 MG tablet TAKE ONE TABLET BY MOUTH EVERY MORNING 08/14/22   [name], MD  torsemide (DEMADEX) 20 MG tablet TAKE ONE TABLET BY MOUTH ONCE DAILY 08/14/22   [name], MD  Vitamin D, Ergocalciferol, (DRISDOL) 1.25 MG (50000 UNIT) CAPS capsule TAKE ONE  CAPSULE BY MOUTH ONCE WEEKLY ON SUNDAY 03/19/22   [name], MD  zinc sulfate 220 (50 Zn) MG capsule Take 1 capsule (220 mg total) by mouth daily. 04/17/21   Persons, [name], PA      Allergies    Bee venom    Review of Systems   Review of Systems  Constitutional:  Positive for chills and fever.  Respiratory:  Positive for cough.   Gastrointestinal:  Positive for nausea and vomiting. Negative for constipation and diarrhea.  Musculoskeletal:  Positive for arthralgias and myalgias.  Neurological:  Positive for headaches. Negative for syncope.  All other systems reviewed and are negative.   Physical Exam Updated Vital Signs BP (!) 133/102 (BP Location: Right Arm)   Pulse 67   Temp 98.4 F (36.9 C) (Oral)   Resp 12   SpO2 100%  Physical Exam Vitals and nursing note reviewed.  Constitutional:      Appearance: Normal appearance.  HENT:     Head: Normocephalic and atraumatic.     Nose: Congestion present.  Eyes:     Conjunctiva/sclera: Conjunctivae normal.     Pupils: Pupils are equal, round, and reactive to light.  Cardiovascular:     Rate and Rhythm: Normal rate and regular rhythm.  Pulmonary:     Effort: Pulmonary effort is normal. No respiratory distress.     Breath sounds: Normal breath sounds. No wheezing.  Abdominal:     General: Abdomen is flat. Bowel sounds are normal.  Musculoskeletal:     Cervical back: Normal range of motion and neck supple.  Skin:    General: Skin is warm and dry.  Neurological:     General: No focal deficit present.     Mental Status: He is alert.  Psychiatric:        Mood and Affect: Mood normal.        Thought Content: Thought content normal.     ED Results / Procedures / Treatments   Labs (all labs ordered are listed, but only abnormal results are displayed) Labs Reviewed  RESP PANEL BY RT-PCR (RSV, FLU A&B, COVID)  RVPGX2 - Abnormal; Notable for the following components:      Result Value   Influenza A by PCR POSITIVE (*)     All other components within normal limits  COMPREHENSIVE METABOLIC PANEL - Abnormal; Notable for the following components:   CO2 20 (*)    Glucose, Bld 102 (*)    BUN 27 (*)    Creatinine, Ser 1.94 (*)    GFR, Estimated 38 (*)    Anion gap 16 (*)    All other components within normal limits  CBC WITH DIFFERENTIAL/PLATELET - Abnormal; Notable for the following components:   WBC 3.8 (*)    RBC 3.75 (*)    Hemoglobin 10.7 (*)    HCT 32.5 (*)    All other components within normal limits  URINALYSIS, ROUTINE W REFLEX MICROSCOPIC  CBG MONITORING, ED  EKG EKG Interpretation  Date/Time:  Monday October 15 2022 21:25:49 EST Ventricular Rate:  85 PR Interval:  128 QRS Duration: 106 QT Interval:  390 QTC Calculation: 464 R Axis:   -35 Text Interpretation: Normal sinus rhythm Left axis deviation Moderate voltage criteria for LVH, may be normal variant ( R in aVL , Cornell product ) Septal infarct , age undetermined Abnormal ECG When compared with ECG of 08-Feb-2021 05:05, PREVIOUS ECG IS PRESENT Confirmed by Gerlene Fee [phone]) on 10/16/2022 6:22:40 AM  Radiology DG Chest 2 View  Result Date: 10/15/2022 CLINICAL DATA:  Cough, fever EXAM: CHEST - 2 VIEW COMPARISON:  03/13/2022 FINDINGS: The heart size and mediastinal contours are within normal limits. Both lungs are clear. The visualized skeletal structures are unremarkable. IMPRESSION: Normal study. Electronically Signed   By: Rolm Baptise M.D.   On: 10/15/2022 21:57    Procedures Procedures   Medications Ordered in ED Medications  acetaminophen (TYLENOL) tablet 650 mg (650 mg Oral Given 10/16/22 0044)  benzonatate (TESSALON) capsule 200 mg (200 mg Oral Given 10/16/22 0606)  oseltamivir (TAMIFLU) capsule 75 mg (75 mg Oral Given 10/16/22 0607)    ED Course/ Medical Decision Making/ A&P                           Medical Decision Making  This patient presents to the ED for concern of influenza.  Differential diagnosis  includes COVID-19, meningitis, viral URI, pneumonia, bronchitis.   Lab Tests:  I Ordered, and personally interpreted labs.  The pertinent results include: Influenza A positive.  Leukocytopenia with white count of 3.8.  Decreased GFR at 38 and elevated creatinine of 1.94.   Imaging Studies ordered:  I ordered imaging studies including chest x-ray I independently visualized and interpreted imaging which showed no acute disease I agree with the radiologist interpretation   Medicines ordered and prescription drug management:  I ordered medication including Tylenol, Tessalon Perles, Tamiflu for influenza A Reevaluation of the patient after these medicines showed that the patient improved I have reviewed the patients home medicines and have made adjustments as needed   Problem List / ED Course:  Patient presented to the ER complaining of 3 days of fever and bodyaches.  While patient was in triage she was screened for basic labs and viral testing which showed he was positive for influenza A.  Patient did not know of any sick contacts but reports that he is currently homeless and in and out of different shelters.  Patient's presentation is complicated by the fact that he is a poorly controlled type II diabetic and chronic kidney disease with noted decreased GFR on CMP today of 38.  Based on patient presentation and patient's current living situation and dose of Tamiflu was started while in the ER today with renal dosing applied. Prescription for 5-day course of renally dosed Tamiflu was also written and advised patient to provide this to his pharmacy of choice.  Patient verbalized understanding results and current treatment plan.  He also verbalized understanding return precautions.   Social Determinants of Health:  Patient reports that he is currently homeless   Final Clinical Impression(s) / ED Diagnoses Final diagnoses:  Influenza A    Rx / DC Orders ED Discharge Orders           Ordered    oseltamivir (TAMIFLU) 30 MG capsule  2 times daily        10/16/22 0540    benzonatate (  TESSALON) 100 MG capsule  Every 8 hours        10/16/22 0622              Luvenia Heller, PA-C 10/16/22 6659    Maudie Flakes, MD 10/16/22 [phone]

## 2022-10-16 NOTE — Patient Instructions (Signed)
Visit Information  [name]  - as a part of your Medicaid benefit, you are eligible for care management and care coordination services at no cost or copay. I was unable to reach you by phone today but would be happy to help you with your health related needs. Please feel free to call me @ [phone].   A member of the Managed Medicaid care management team will reach out to you again over the next 14 days.   Zemirah Krasinski RN, BSN [place]  [facility] RN Care Coordinator   

## 2022-10-17 ENCOUNTER — Encounter: Payer: Self-pay | Admitting: Physician Assistant

## 2022-10-17 ENCOUNTER — Telehealth: Payer: Self-pay | Admitting: *Deleted

## 2022-10-17 ENCOUNTER — Other Ambulatory Visit: Payer: Self-pay

## 2022-10-17 ENCOUNTER — Other Ambulatory Visit (HOSPITAL_COMMUNITY): Payer: Self-pay

## 2022-10-17 NOTE — Patient Outreach (Signed)
  Care Coordination [facility] Note Transition Care Management Unsuccessful Follow-up Telephone Call  Date of discharge and from where:  10/15/22 from [facility]  Attempts:  1st Attempt  Reason for unsuccessful TCM follow-up call:  No answer/busy   Estanislado Emms RN, BSN [place]  [facility] RN Care Coordinator

## 2022-10-17 NOTE — Progress Notes (Signed)
Pt dx flu, was prescribed Tamiflu and Tessalon perles.   Pt had a flu shot 10/11 at PCP office.   He does not wish to keep the gloves, does not feel they are big enough.  Wants to get his motorized wheelchair, is very focused on that.   He has body aches, given Tylenol, 500 mg, take 2 tabs tid.  Rx Meds were made easier access.  Compliance w/ flu meds is encouraged.   No other issues or concerns.   Theodore Demark, PA-C 10/17/2022 3:36 PM

## 2022-10-23 NOTE — Telephone Encounter (Signed)
Message sent to Debbie Roach/ [employer], requesting an update on appointment for PT seating eval for power chair   

## 2022-10-24 ENCOUNTER — Encounter: Payer: Self-pay | Admitting: Physician Assistant

## 2022-10-24 ENCOUNTER — Other Ambulatory Visit: Payer: Medicaid Other | Admitting: *Deleted

## 2022-10-24 ENCOUNTER — Other Ambulatory Visit: Payer: Self-pay | Admitting: Critical Care Medicine

## 2022-10-24 DIAGNOSIS — I509 Heart failure, unspecified: Secondary | ICD-10-CM

## 2022-10-24 NOTE — Patient Outreach (Signed)
  Medicaid Managed Care   Unsuccessful Attempt Note   10/24/2022 Name: Scott Greer MRN: [id number] DOB: 1956-02-14  Referred by: [name], MD Reason for referral : High Risk Managed Medicaid (Unsuccessful RNCM follow up telephone outreach)   A second unsuccessful telephone outreach was attempted today. The patient was referred to the case management team for assistance with care management and care coordination.    Follow Up Plan: A HIPAA compliant phone message was left for the patient providing contact information and requesting a return call. and The Managed Medicaid care management team will reach out to the patient again over the next 14 days.    Estanislado Emms RN, BSN [place]  [facility] [employer]

## 2022-10-24 NOTE — Progress Notes (Signed)
Pt seen by Dr Delford Field.  Has not quite finished the Tamiflu.   He feels over the flu.   Is not taking the gabapentin and Bidil at lunch time. Needs BID meds >> will change the gaba and Bidil to bid>> f/u with Cards.   Meds were organized and med box filled.  Although he has multiple complaints, no acute issues.   Mentions frequent urination, but volume status appears to be good.   BP was high when nurse checked it, f/u with PCP.   Leta Jungling and pt were reminded of his appt 12/21 at 2:50 pm. She will arrange transportation.   Theodore Demark, PA-C 10/24/2022 4:09 PM

## 2022-10-24 NOTE — Patient Instructions (Signed)
Visit Information  Mr. Gorge L Castello  - as a part of your Medicaid benefit, you are eligible for care management and care coordination services at no cost or copay. I was unable to reach you by phone today but would be happy to help you with your health related needs. Please feel free to call me @ [phone].   A member of the Managed Medicaid care management team will reach out to you again over the next 14 days.   Myers Tutterow RN, BSN [place]  [facility] RN Care Coordinator   

## 2022-10-24 NOTE — Congregational Nurse Program (Signed)
  Dept: [phone]   Congregational Nurse Program Note  Date of Encounter: 10/23/2022  Clinic visit for follow-up from influenza, has no cough or congestion and has one more day to complete Tamiflu, drinking liquids. Requested assistance with filling pillbox d/t difficulty with using medication prepackage from [facility] provided by Medicaid. Past Medical History: Past Medical History:  Diagnosis Date   CHF (congestive heart failure) (HCC)    Dehiscence of amputation stump (HCC)    left great toe   Diabetes mellitus without complication (HCC) 1999   Type II   Glaucoma 2015   Hyperlipidemia    Hypertension 2005   Left foot infection 02/07/2021   Osteomyelitis (HCC)    Wears glasses     Encounter Details:  CNP Questionnaire - 10/23/22 1000       Questionnaire   Ask client: Do you give verbal consent for me to treat you today? Yes    Student Assistance N/A    Location Patient Served  [facility]    Visit Setting with Client Organization    Patient Status Unhoused    Insurance Medicaid    Insurance/Financial Assistance Referral N/A    Medication N/A    Medical Provider Yes    Screening Referrals Made N/A    Medical Referrals [facility]    Medical Appointment Made [facility]    Recently w/o PCP, now 1st time PCP visit completed due to CNs referral or appointment made N/A    Food Have Food Insecurities    Transportation Need transportation assistance    Housing/Utilities No permanent housing    Interpersonal Safety Do not feel safe at current residence    Interventions Advocate/Support;Navigate Healthcare System;Case Management;Counsel;Educate;Reviewed Medications    Abnormal to Normal Screening Since Last CN Visit N/A    Screenings CN Performed N/A    Sent Client to Lab for: N/A    Did client attend any of the following based off CNs referral or appointments made? N/A    ED Visit Averted N/A    Life-Saving Intervention Made N/A

## 2022-10-25 ENCOUNTER — Encounter: Payer: Self-pay | Admitting: Physician Assistant

## 2022-10-25 ENCOUNTER — Ambulatory Visit: Payer: Medicaid Other | Attending: Internal Medicine | Admitting: Physician Assistant

## 2022-10-25 VITALS — BP 153/79 | HR 54

## 2022-10-25 DIAGNOSIS — Z760 Encounter for issue of repeat prescription: Secondary | ICD-10-CM

## 2022-10-25 DIAGNOSIS — E1159 Type 2 diabetes mellitus with other circulatory complications: Secondary | ICD-10-CM | POA: Diagnosis not present

## 2022-10-25 DIAGNOSIS — E1121 Type 2 diabetes mellitus with diabetic nephropathy: Secondary | ICD-10-CM | POA: Diagnosis not present

## 2022-10-25 DIAGNOSIS — Z09 Encounter for follow-up examination after completed treatment for conditions other than malignant neoplasm: Secondary | ICD-10-CM

## 2022-10-25 DIAGNOSIS — E782 Mixed hyperlipidemia: Secondary | ICD-10-CM

## 2022-10-25 DIAGNOSIS — Z5900 Homelessness unspecified: Secondary | ICD-10-CM

## 2022-10-25 DIAGNOSIS — H539 Unspecified visual disturbance: Secondary | ICD-10-CM | POA: Diagnosis not present

## 2022-10-25 DIAGNOSIS — I5042 Chronic combined systolic (congestive) and diastolic (congestive) heart failure: Secondary | ICD-10-CM

## 2022-10-25 DIAGNOSIS — Z7984 Long term (current) use of oral hypoglycemic drugs: Secondary | ICD-10-CM

## 2022-10-25 DIAGNOSIS — R350 Frequency of micturition: Secondary | ICD-10-CM

## 2022-10-25 DIAGNOSIS — Z5901 Sheltered homelessness: Secondary | ICD-10-CM

## 2022-10-25 DIAGNOSIS — I152 Hypertension secondary to endocrine disorders: Secondary | ICD-10-CM

## 2022-10-25 MED ORDER — METFORMIN HCL 500 MG PO TABS: 500.0000 mg | ORAL_TABLET | Freq: Every day | ORAL | 6 refills | Status: DC

## 2022-10-25 MED ORDER — TORSEMIDE 20 MG PO TABS: ORAL_TABLET | ORAL | 1 refills | Status: DC

## 2022-10-25 MED ORDER — ISOSORB DINITRATE-HYDRALAZINE 20-37.5 MG PO TABS: ORAL_TABLET | ORAL | 6 refills | Status: DC

## 2022-10-25 MED ORDER — ASPIRIN 81 MG PO CHEW: CHEWABLE_TABLET | ORAL | 2 refills | Status: DC

## 2022-10-25 MED ORDER — AMIODARONE HCL 200 MG PO TABS: 200.0000 mg | ORAL_TABLET | Freq: Every day | ORAL | 2 refills | Status: DC

## 2022-10-25 MED ORDER — CARVEDILOL 3.125 MG PO TABS: 3.1250 mg | ORAL_TABLET | Freq: Two times a day (BID) | ORAL | 6 refills | Status: DC

## 2022-10-25 MED ORDER — SPIRONOLACTONE 25 MG PO TABS: ORAL_TABLET | ORAL | 6 refills | Status: DC

## 2022-10-25 MED ORDER — ENTRESTO 24-26 MG PO TABS: 1.0000 | ORAL_TABLET | Freq: Two times a day (BID) | ORAL | 6 refills | Status: DC

## 2022-10-25 MED ORDER — ATORVASTATIN CALCIUM 10 MG PO TABS: 10.0000 mg | ORAL_TABLET | Freq: Every evening | ORAL | 6 refills | Status: DC

## 2022-10-25 MED ORDER — EMPAGLIFLOZIN 10 MG PO TABS: ORAL_TABLET | ORAL | 6 refills | Status: DC

## 2022-10-25 MED ORDER — DIGOXIN 125 MCG PO TABS: ORAL_TABLET | ORAL | 6 refills | Status: DC

## 2022-10-25 MED ORDER — GABAPENTIN 300 MG PO CAPS: 300.0000 mg | ORAL_CAPSULE | Freq: Two times a day (BID) | ORAL | 1 refills | Status: DC

## 2022-10-25 NOTE — Telephone Encounter (Signed)
I met with the patient when he was in the clinic this afternoon.  He said that he did not know he was supposed to call [facility]  and he did not have a number to call her.  I sent an email to Andria Rhein with his phone number requesting she reach out to him again to schedule the PT seating eval.  I also left her a voicemail message requesting a call back

## 2022-10-25 NOTE — Telephone Encounter (Signed)
Message received from [facility] stating that they are waiting for a call back from patient.

## 2022-10-25 NOTE — Progress Notes (Signed)
Patient ID: Scott Greer, male   DOB: 1956/06/07, 66 y.o.   MRN: [id number]   Scott Greer, is a 66 y.o. male  TXM:[id number]  [id number]  DOB - 10/22/1956  Chief Complaint  Patient presents with   Hospitalization Follow-up       Subjective:   Scott Greer is a 66 y.o. male here today for a hospital follow up after testing positive for flu on 10/15/2022.  He is staying at the [facility]/homeless and is here for med rec and RF.  He has an updated medication list/doses and I have reconciled these into his med list.  I discussed his case with Eden Lathe as the patient is very unclear as to why he is here.  He is feeling much better from the flu.   No problems updated.  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 Authorizing Provider  ferrous sulfate (FEROSUL) 325 (65 FE) MG tablet TAKE ONE TABLET BY MOUTH EVERY MORNING WITH BREAKFAST 08/15/22  Yes [name], MD  Accu-Chek Softclix Lancets lancets Use to test blood sugars up to 4 times daily as directed. Patient not taking: Reported on 10/25/2022 03/19/22   [name], MD  amiodarone (PACERONE) 200 MG tablet Take 1 tablet (200 mg total) by mouth daily. 10/25/22   [name], PA-C  aspirin (ASPIRIN LOW DOSE) 81 MG chewable tablet CHEW ONE TABLET BY MOUTH ONCE DAILY Needs appointment for further refills 10/25/22   [name], PA-C  atorvastatin (LIPITOR) 10 MG tablet Take 1 tablet (10 mg total) by mouth every  evening. 10/25/22   [name], PA-C  Blood Glucose Monitoring Suppl (ACCU-CHEK GUIDE) w/Device KIT Use to test blood sugars up to 4 times daily as directed. Patient not taking: Reported on 10/25/2022 03/19/22   [name], MD  Blood Glucose Monitoring Suppl (TRUE METRIX METER) w/Device KIT Use as directed Patient not taking: Reported on 10/25/2022 10/23/19   [name], MD  carvedilol (COREG) 3.125 MG tablet Take 1 tablet (3.125 mg total) by mouth 2 (two) times daily with a meal. 10/25/22   [name], PA-C  Continuous Blood Gluc Receiver (FREESTYLE LIBRE 2 READER) DEVI UAD Patient not taking: Reported on 10/25/2022 08/14/22   [name], MD  Continuous Blood Gluc Sensor ([place]) MISC Change sensor Q 2 wks Patient not taking: Reported on 10/25/2022 08/14/22   [name], MD  digoxin (LANOXIN) 0.125 MG tablet TAKE ONE TABLET BY MOUTH EVERY EVENING WITH SUPPER 10/25/22   [name], PA-C  empagliflozin (JARDIANCE) 10 MG TABS tablet TAKE ONE TABLET BY MOUTH AT NOON 10/25/22   [name], PA-C  gabapentin (NEURONTIN) 300 MG capsule Take 1 capsule (300 mg total) by mouth 2 (two) times daily. 10/25/22   [name], PA-C  glucose blood (ACCU-CHEK GUIDE) test strip Use to test blood sugars up to 4 times daily  as directed. Patient not taking: Reported on 10/25/2022 03/19/22   [name], MD  isosorbide-hydrALAZINE (BIDIL) 20-37.5 MG tablet TAKE ONE TABLET BY MOUTH Two TIMES DAILY 10/25/22   [name], PA-C  metFORMIN (GLUCOPHAGE) 500 MG tablet Take 1 tablet (500 mg total) by mouth daily with breakfast. 10/25/22   [name], PA-C  nutrition supplement, JUVEN, (JUVEN) PACK Take 1 packet by mouth 2 (two) times daily between meals. Patient not taking: Reported on 10/25/2022 04/17/21   Persons, [name], PA  sacubitril-valsartan (ENTRESTO) 24-26 MG Take 1 tablet by mouth 2 (two) times daily. Absolute last  refill without office visit please call [phone] to schedule 10/25/22   [name], PA-C  spironolactone (ALDACTONE) 25 MG tablet TAKE ONE TABLET BY MOUTH EVERY MORNING 10/25/22   [name], PA-C  torsemide (DEMADEX) 20 MG tablet TAKE ONE TABLET BY MOUTH ONCE DAILY 10/25/22   [name], PA-C  zinc sulfate 220 (50 Zn) MG capsule Take 1 capsule (220 mg total) by mouth daily. Patient not taking: Reported on 10/25/2022 04/17/21   Persons, [name], PA    ROS: Neg HEENT Neg resp Neg cardiac Neg GI Neg GU Neg MS Neg psych Neg neuro  Objective:   Vitals:   10/25/22 1531  BP: (!) 153/79  Pulse: (!) 54  SpO2: 95%   Exam General appearance : Awake, alert, not in any distress. Speech tangential. Not toxic looking.  In wheelchair.  B BKA HEENT: Atraumatic and Normocephalic Neck: Supple, no JVD. No cervical lymphadenopathy.  Chest: Good air entry bilaterally, CTAB.  No rales/rhonchi/wheezing CVS: S1 S2 regular, no murmurs.  Extremities: B/L Lower Ext shows no edema, both legs are warm to touch Neurology: Awake alert, and oriented X 3, CN II-XII intact, Non focal Skin: No Rash  Data Review Lab Results  Component Value Date   HGBA1C 6.9 08/16/2022   HGBA1C 6.0 07/18/2021   HGBA1C 6.7 (H) 04/15/2021    Assessment & Plan   1. Type 2 diabetes mellitus with diabetic nephropathy, without long-term current use of insulin ([place]) Med rec based on paper work he brought.   - Glucose (CBG) - metFORMIN (GLUCOPHAGE) 500 MG tablet; Take 1 tablet (500 mg total) by mouth daily with breakfast.  Dispense: 30 tablet; Refill: 6  2. Chronic combined systolic and diastolic CHF (congestive heart failure) (HCC) - torsemide (DEMADEX) 20 MG tablet; TAKE ONE TABLET BY MOUTH ONCE DAILY  Dispense: 30 tablet; Refill: 1 - amiodarone (PACERONE) 200 MG tablet; Take 1 tablet (200 mg total) by mouth daily.  Dispense: 30 tablet; Refill: 2 - aspirin (ASPIRIN LOW DOSE) 81 MG chewable  tablet; CHEW ONE TABLET BY MOUTH ONCE DAILY Needs appointment for further refills  Dispense: 30 tablet; Refill: 2 - carvedilol (COREG) 3.125 MG tablet; Take 1 tablet (3.125 mg total) by mouth 2 (two) times daily with a meal.  Dispense: 60 tablet; Refill: 6 - digoxin (LANOXIN) 0.125 MG tablet; TAKE ONE TABLET BY MOUTH EVERY EVENING WITH SUPPER  Dispense: 30 tablet; Refill: 6 - empagliflozin (JARDIANCE) 10 MG TABS tablet; TAKE ONE TABLET BY MOUTH AT NOON  Dispense: 30 tablet; Refill: 6 - sacubitril-valsartan (ENTRESTO) 24-26 MG; Take 1 tablet by mouth 2 (two) times daily. Absolute last refill without office visit please call [phone] to schedule  Dispense: 60 tablet; Refill: 6 - spironolactone (ALDACTONE) 25 MG tablet; TAKE ONE TABLET BY MOUTH EVERY MORNING  Dispense: 30 tablet; Refill: 6 - isosorbide-hydrALAZINE (BIDIL) 20-37.5 MG tablet; TAKE  ONE TABLET BY MOUTH Two TIMES DAILY  Dispense: 90 tablet; Refill: 6  3. Mixed hyperlipidemia - atorvastatin (LIPITOR) 10 MG tablet; Take 1 tablet (10 mg total) by mouth every evening.  Dispense: 30 tablet; Refill: 6  4. Hypertension associated with type 2 diabetes mellitus (HCC)  - empagliflozin (JARDIANCE) 10 MG TABS tablet; TAKE ONE TABLET BY MOUTH AT NOON  Dispense: 30 tablet; Refill: 6  5. Homeless Staying at [facility]  6. Medication refill - gabapentin (NEURONTIN) 300 MG capsule; Take 1 capsule (300 mg total) by mouth 2 (two) times daily.  Dispense: 180 capsule; Refill: 1  7. Vision changes - Ambulatory referral to Ophthalmology  8. Urinary frequency No infection - POCT URINALYSIS DIP (CLINITEK)  9. Encounter for examination following treatment at hospital Doing well    He has a follow up scheduled with Dr Wynetta Emery  The patient was given clear instructions to go to ER or return to medical center if symptoms don't improve, worsen or new problems develop. The patient verbalized understanding. The patient was told to call to get lab  results if they haven't heard anything in the next week.      Freeman Caldron, PA-C [facility], [place]   10/29/2022, 11:14 AM

## 2022-10-30 DIAGNOSIS — Z89512 Acquired absence of left leg below knee: Secondary | ICD-10-CM | POA: Diagnosis not present

## 2022-10-30 DIAGNOSIS — Z89511 Acquired absence of right leg below knee: Secondary | ICD-10-CM | POA: Diagnosis not present

## 2022-10-30 LAB — POCT URINALYSIS DIP (CLINITEK)
Bilirubin, UA: NEGATIVE
Blood, UA: NEGATIVE
Glucose, UA: 500 mg/dL — AB
Ketones, POC UA: NEGATIVE mg/dL
Leukocytes, UA: NEGATIVE
Nitrite, UA: NEGATIVE
POC PROTEIN,UA: >=300 — AB
Spec Grav, UA: 1.02 (ref 1.010–1.025)
Urobilinogen, UA: 0.2 E.U./dL
pH, UA: 7 (ref 5.0–8.0)

## 2022-11-02 ENCOUNTER — Other Ambulatory Visit: Payer: Self-pay | Admitting: Orthopedic Surgery

## 2022-11-02 ENCOUNTER — Telehealth: Payer: Self-pay | Admitting: Orthopedic Surgery

## 2022-11-02 DIAGNOSIS — Z89511 Acquired absence of right leg below knee: Secondary | ICD-10-CM

## 2022-11-02 NOTE — Telephone Encounter (Signed)
SW pt, okay for him to see PT without an appointment here. He was just seen in October. He does have transportation issues. He did ask about getting him set up for transportation there and from. I asked him when he called neurorehab to scheduled his appointment to ask them about that, they should be able to set up for him at there office.

## 2022-11-02 NOTE — Telephone Encounter (Signed)
Patient states he got his new leg and needs to if he goes to therapy or comes back to see Dr Lajoyce Corners. [phone]

## 2022-11-06 DIAGNOSIS — E1121 Type 2 diabetes mellitus with diabetic nephropathy: Secondary | ICD-10-CM

## 2022-11-06 LAB — GLUCOSE, POCT (MANUAL RESULT ENTRY): POC Glucose: 213 mg/dl — AB (ref 70–99)

## 2022-11-06 NOTE — Congregational Nurse Program (Signed)
  Dept: [phone]   Congregational Nurse Program Note  Date of Encounter: 11/06/2022  Clinic visit to check blood pressure and blood glucose.  BP 148/75, pulse 78, O2 Sat 98%.  Blood glucose 213 ac lunch, states he missed taking Meformin while out of the shelter with family for the holiday.  Reviewed need to take medication each day as prescribed.  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 Yes    Student Assistance N/A    Location Patient [facility]    Visit Setting with Client Organization    Patient Status Unhoused    Insurance Medicaid    Insurance/Financial Assistance Referral N/A    Medication N/A    Medical Provider Yes    Screening Referrals Made [facility]    Medical Referrals Made [facility]    Medical Appointment Made N/A    Recently w/o PCP, now 1st time PCP visit completed due to CNs referral or appointment made N/A    Food Have Food Insecurities    Transportation Need transportation assistance    Housing/Utilities No permanent housing    Interpersonal Safety Do not feel safe at current residence    Interventions Advocate/Support;Navigate Healthcare System;Case Management;Counsel;Educate    Abnormal to Normal Screening Since Last CN Visit N/A    Screenings CN Performed Pulse Ox;Blood Pressure;Blood Glucose    Sent Client to Lab for: N/A    Did client attend any of the following based off CNs referral or appointments made? N/A    ED Visit Averted N/A    Life-Saving Intervention Made N/A

## 2022-11-08 NOTE — Congregational Nurse Program (Signed)
  Dept: [phone]   Congregational Nurse Program Note  Date of Encounter: 11/08/2022  Clinic visit to assist with medications provided in daily packaging by [facility].  Reviewed medicines ordered on 10/25/2022 and the lunch time packet didn't match current orders.  Spoke with Ariel at [facility] and they are to correct the lunch time packet. Resident able to distinguish the correct medicine for lunch time, is to only take Jardiance at lunchtime per the MD orders and disregard the other pill in the packet until the pharmacy can send corrected medicines. 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 Yes    Student Assistance N/A    Location Patient [facility]    Visit Setting with Client Organization    Patient Status Unhoused    Insurance Medicaid    Insurance/Financial Assistance Referral N/A    Medication Have Medication Insecurities    Medical Provider Yes    Screening Referrals Made N/A    Medical Referrals Made N/A    Medical Appointment Made N/A    Recently w/o PCP, now 1st time PCP visit completed due to CNs referral or appointment made N/A    Food Have Food Insecurities    Transportation Need transportation assistance    Housing/Utilities No permanent housing    Interpersonal Safety Do not feel safe at current residence    Interventions Advocate/Support;Case Management;Counsel;Educate;Navigate Healthcare System;Reviewed Medications    Abnormal to Normal Screening Since Last CN Visit N/A    Screenings CN  Performed N/A    Sent Client to Lab for: N/A    Did client attend any of the following based off CNs referral or appointments made? N/A    ED Visit Averted N/A    Life-Saving Intervention Made N/A

## 2022-11-13 ENCOUNTER — Ambulatory Visit: Payer: Medicaid Other | Admitting: *Deleted

## 2022-11-13 NOTE — Telephone Encounter (Signed)
I spoke to the patient and to Andria Rhein, [place].  He has a routine PT session scheduled for tomorrow, 11/14/2022 and his seating eval for the power wheelchair is scheduled for 11/28/2022 in [facility].  He is aware of both appointments.

## 2022-11-14 ENCOUNTER — Other Ambulatory Visit: Payer: Self-pay

## 2022-11-14 ENCOUNTER — Ambulatory Visit: Payer: Medicaid Other | Attending: Orthopedic Surgery | Admitting: Rehabilitation

## 2022-11-14 ENCOUNTER — Telehealth: Payer: Self-pay | Admitting: Rehabilitation

## 2022-11-14 ENCOUNTER — Encounter: Payer: Self-pay | Admitting: Rehabilitation

## 2022-11-14 DIAGNOSIS — R2689 Other abnormalities of gait and mobility: Secondary | ICD-10-CM

## 2022-11-14 DIAGNOSIS — Z89511 Acquired absence of right leg below knee: Secondary | ICD-10-CM | POA: Insufficient documentation

## 2022-11-14 DIAGNOSIS — R293 Abnormal posture: Secondary | ICD-10-CM

## 2022-11-14 DIAGNOSIS — Z89512 Acquired absence of left leg below knee: Secondary | ICD-10-CM | POA: Diagnosis not present

## 2022-11-14 DIAGNOSIS — R2681 Unsteadiness on feet: Secondary | ICD-10-CM | POA: Diagnosis not present

## 2022-11-14 DIAGNOSIS — M6281 Muscle weakness (generalized): Secondary | ICD-10-CM

## 2022-11-14 NOTE — Therapy (Addendum)
OUTPATIENT PHYSICAL THERAPY PROSTHETICS EVALUATION   Patient Name: Scott Greer MRN: [id number] DOB:[birth year], 67 y.o., male Today's Date: 11/14/2022  PCP: Karle Plumber, MD REFERRING PROVIDER: Karle Plumber, MD   END OF SESSION:  PT End of Session - 11/14/22 1022     Visit Number 1    Number of Visits 17    Date for PT Re-Evaluation 01/13/23    Authorization Type [employer] (27 v limit)    Authorization - Number of Visits 24    PT Start Time 1017    PT Stop Time 1105    PT Time Calculation (min) 48 min    Activity Tolerance Patient tolerated treatment well    Behavior During Therapy WFL for tasks assessed/performed             Past Medical History:  Diagnosis Date   CHF (congestive heart failure) ([place])    Dehiscence of amputation stump ([place])    left great toe   Diabetes mellitus without complication ([place]) 5027   Type II   Glaucoma 2015   Hyperlipidemia    Hypertension 2005   Left foot infection 02/07/2021   Osteomyelitis ([place])    Wears glasses    Past Surgical History:  Procedure Laterality Date   AMPUTATION Left 06/05/2019   Procedure: LEFT GREAT TOE AMPUTATION;  Surgeon: Newt Minion, MD;  Location: [place];  Service: Orthopedics;  Laterality: Left;   AMPUTATION Left 07/10/2019   Procedure: LEFT FOOT 1ST RAY AMPUTATION;  Surgeon: Newt Minion, MD;  Location: [place];  Service: Orthopedics;  Laterality: Left;   AMPUTATION Right 05/25/2020   Procedure: RIGHT GREAT TOE AMPUTATION;  Surgeon: Newt Minion, MD;  Location: [place];  Service: Orthopedics;  Laterality: Right;   AMPUTATION Left 01/25/2021   Procedure: LEFT 2ND TOE AMPUTATION;  Surgeon: Newt Minion, MD;  Location: [place];  Service: Orthopedics;  Laterality: Left;   AMPUTATION Left 02/08/2021   Procedure: LEFT TRANSMETATARSAL AMPUTATION;  Surgeon: Newt Minion, MD;  Location: [place];  Service: Orthopedics;  Laterality: Left;   AMPUTATION Left 02/10/2021   Procedure: AMPUTATION BELOW KNEE;   Surgeon: Newt Minion, MD;  Location: [place];  Service: Orthopedics;  Laterality: Left;   AMPUTATION Right 04/14/2021   Procedure: RIGHT 1ST AND 2ND RAY AMPUTATION;  Surgeon: Newt Minion, MD;  Location: [place];  Service: Orthopedics;  Laterality: Right;   AMPUTATION Right 04/26/2021   Procedure: RIGHT BELOW KNEE AMPUTATION;  Surgeon: Newt Minion, MD;  Location: [place];  Service: Orthopedics;  Laterality: Right;   NO PAST SURGERIES     RIGHT/LEFT HEART CATH AND CORONARY ANGIOGRAPHY N/A 01/02/2021   Procedure: RIGHT/LEFT HEART CATH AND CORONARY ANGIOGRAPHY;  Surgeon: Lorretta Harp, MD;  Location: [place] [facility];  Service: Cardiovascular;  Laterality: N/A;   Patient Active Problem List   Diagnosis Date Noted   Chronic combined systolic and diastolic CHF (congestive heart failure) ([place]) 12/01/2021   S/P BKA (below knee amputation) bilateral ([place]) 07/18/2021   Gangrene of right foot (HCC)    Foot pain, right 04/14/2021   Status post below-knee amputation ([place]) 02/17/2021   Wound dehiscence    Gangrene of toe of left foot (HCC)    Cutaneous abscess of left foot    Left foot infection 02/07/2021   Leukocytosis 02/07/2021   Thrombocytosis 02/07/2021   Hyponatremia 02/07/2021   AKI (acute kidney injury) ([place]) 02/07/2021   Hyperglycemia due to diabetes mellitus ([place]) 02/07/2021  New onset of congestive heart failure (HCC) 12/26/2020   CKD (chronic kidney disease) stage 3, GFR 30-59 ml/min (HCC) 12/26/2020   Acute systolic CHF (congestive heart failure) (HCC) 12/26/2020   Anemia, chronic disease 06/22/2020   Amputee, great toe, right (HCC) 06/20/2020   Normocytic anemia 06/20/2020   Erectile dysfunction associated with type 2 diabetes mellitus (HCC) 06/20/2020   Positive for macroalbuminuria 10/24/2019   Amputation of left great toe (HCC) 10/23/2019   Tobacco dependence 10/23/2019   Diabetic foot infection (HCC)    Noncompliance    Glaucoma    Hyperlipidemia    Essential  hypertension 11/06/2003   Diabetes mellitus (HCC) 11/05/1997    ONSET DATE: 11/02/2022 (referral date) (He reports he got legs on 12/26)  REFERRING DIAG: Z89.512,Z89.511 (ICD-10-CM) - S/P BKA (below knee amputation) bilateral (HCC)  THERAPY DIAG:  Other abnormalities of gait and mobility  Muscle weakness (generalized)  Unsteadiness on feet  Abnormal posture  Rationale for Evaluation and Treatment: Rehabilitation  SUBJECTIVE:   SUBJECTIVE STATEMENT: "I just got my legs back on Dec 26th.  I'm ready to start being able to use these legs.   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  left BKA on 02/10/21 and R BKA on 04/26/21.   PAIN:  Are you having pain? Yes: NPRS scale: 7/10 Pain location: L knee  Pain description: sore, achy  Aggravating factors: nothing Relieving factors: rest   PRECAUTIONS: Fall  WEIGHT BEARING RESTRICTIONS: No  FALLS: Has patient fallen in last 6 months? Yes. Number of falls a few   LIVING ENVIRONMENT: Lives with: lives with their family and is homeless is living at [facility]  Lives in: Homeless shelter Home Access: Elevator Home layout: Two level Stairs: No Has following equipment at home: Wheelchair (manual)  PLOF: Independent with basic ADLs, Independent with community mobility with device, and Independent with homemaking with wheelchair  PATIENT GOALS: "I want to be able to get my own apartment.  I want to walk independently with these legs."n  OBJECTIVE:   DIAGNOSTIC FINDINGS: n/a  COGNITION: Overall cognitive status: Within functional limits for tasks assessed   SENSATION: WFL  MUSCLE LENGTH: Some tightness noted in B knees in standing, unable to achieve full knee extension, lacking approx 10 deg in standing.    POSTURE: rounded shoulders, forward head, and flexed trunk   LOWER  EXTREMITY ROM:  ROM Right eval Left eval  Hip flexion    Hip extension    Hip abduction    Hip adduction    Hip internal rotation    Hip external rotation    Knee flexion    Knee extension    Ankle dorsiflexion    Ankle plantarflexion    Ankle inversion    Ankle eversion     (Blank rows = not tested) Grossly WFL, did not formally measure knee extension lag.   LOWER EXTREMITY MMT:  MMT Right eval Left eval  Hip flexion 4/5 4/5  Hip extension    Hip abduction 4/5 4/5  Hip adduction 4/5 4/5  Hip internal rotation    Hip external rotation    Knee flexion 4/5 4/5  Knee extension 4/5 4/5  Ankle dorsiflexion    Ankle plantarflexion    Ankle inversion    Ankle eversion    (Blank rows = not tested) All tested in seated position.  TRANSFERS: Sit to stand: Min A Stand to sit: Min A Stand pivot transfer: Min A    GAIT: Gait pattern: step through pattern, decreased stride length, knee flexed in stance- Right, knee flexed in stance- Left, trunk flexed, and narrow BOS Distance walked: 80' x 2 reps then TUG Assistive device utilized: Environmental consultant - 2 wheeled Level of assistance: Min A Gait velocity: 1.13 ft/sec with RW and min A Comments: Utilized RW with band and two targets tied on so provided cues on aiming R foot for R target and L foot for left target.  He did very well with this.  Forward flexed posture and noted decreased ability to extend B knees in stance.    FUNCTIONAL TESTS:  Timed up and go (TUG): 43.69 secs with RW and min A 10 meter walk test: 1.13 ft/sec with RW and min A     CURRENT PROSTHETIC WEAR ASSESSMENT: Patient is independent with: skin check and residual limb care Patient is dependent with: prosthetic cleaning, ply sock cleaning, correct ply sock adjustment, proper wear schedule/adjustment, and proper weight-bearing schedule/adjustment Donning prosthesis: Min A Doffing prosthesis: Min A (needed more assist today as fabric got caught in  pin) Prosthetic wear tolerance: Reports he is wearing for about 30 mins a few times day  Prosthetic weight bearing tolerance: 2-3 minutes Edema: none noted  Residual limb condition: Pts skin is in excellent condition, good hydration and hair growth.  Prosthetic description: pin lock suspension with flexible inner socket.  He does 1 ply over this and then 5 ply over silicone liner.   K code/activity level with prosthetic use: Level 3      TODAY'S TREATMENT:                                                                                                                                         DATE: no treatment initiated today   TREATMENT:  PATIENT EDUCATED ON FOLLOWING PROSTHETIC CARE: Prosthetic wear tolerance: 2 hours, 2x/day, 7 days/week Prosthetic weight bearing tolerance: 3-5 minutes Other education  Correct ply sock adjustment, Propper donning, Proper doffing, Proper wear schedule/adjustment, and Proper weight-bearing schedule/adjustment  PATIENT EDUCATION: Education details: Educated on above, evaluation results, POC, goals and PT to send request to MD for RW order.  Person educated: Patient Education method: Explanation, Demonstration, and Verbal cues Education comprehension: verbalized understanding and needs further education  HOME EXERCISE PROGRAM: None initiated today   ASSESSMENT:  CLINICAL IMPRESSION: Patient is a 67 y.o. male  who was seen today for physical therapy evaluation and treatment for B BKA for prosthetic training. 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  Upon PT evaluation, PT assessed pts skin and note that sock over flexible liner got stuck in pin so provided education on ensuring that both sets  of fabric are clear from hole to ensure this doesn't happen.  Also provided education on how to better align pin by holding with B thumbs.   Note gait speed was 1.13 ft/sec with RW, indicative of recurrent fall risk, and TUG time of 43 secs with RW, indicative of high fall risk.   OBJECTIVE IMPAIRMENTS: Abnormal gait, decreased activity tolerance, decreased balance, decreased knowledge of use of DME, decreased mobility, decreased ROM, decreased strength, impaired perceived functional ability, impaired flexibility, impaired UE functional use, postural dysfunction, and prosthetic dependency .   ACTIVITY LIMITATIONS: carrying, lifting, bending, standing, squatting, stairs, transfers, bathing, and locomotion level  PARTICIPATION LIMITATIONS: meal prep, cleaning, laundry, driving, shopping, and community activity  PERSONAL FACTORS: Past/current experiences and 3+ comorbidities: see above  are also affecting patient's functional outcome.   REHAB POTENTIAL: Good  CLINICAL DECISION MAKING: Evolving/moderate complexity  EVALUATION COMPLEXITY: Moderate   GOALS: Goals reviewed with patient? Yes  SHORT TERM GOALS: Target date: 12/15/22  Pt will be IND with initial HEP in order to indicate improved functional mobility and dec fall risk. Baseline: None given at this time Goal status: INITIAL  2.  Pt will improve gait speed to >/=1.8 ft/sec w/ RW in order to indicate dec fall risk.  Baseline: 1.13 ft/sec with RW Goal status: INITIAL  3.  Pt will ambulate 150' with RW at S level in order to indicate more independent household ambulation.   Baseline: 75' with RW at min A  Goal status: INITIAL  4.  Pt will improve TUG to </=38 secs w/ RW at S level in order to indicate dec fall risk   Baseline: 43.69 secs with RW and min A Goal status: INITIAL  5.  Pt will report wearing B prostheses 60% of awake hours, donning and doffing independently without skin issues.   Baseline: only wearing 30 mins a few times a day, needs some assist to don appropriately  Goal status: INITIAL  6.  Will assess BERG balance test and improve score by 4  points from baseline in order to indicate dec fall risk.  Baseline: did not assess at this time  Goal status: INITIAL  LONG TERM GOALS: Target date: 01/13/23  Pt will be IND with final HEP in order to indicate improved functional mobility and dec fall risk. Baseline: none initiated at this time  Goal status: INITIAL  2.  Pt will improve gait speed to >/=2.62 ft/sec w/ RW in order to indicate dec fall risk.  Baseline: 1.13 ft/sec with RW and min A  Goal status: INITIAL  3.  Pt will negotiate up/down 4 steps with B rail, up/down ramp and curb and ambulate x 500' outdoors over unlevel paved surfaces with RW at S level in order to indicate improved community mobility.   Baseline: Not assessed due to time constraint Goal status: INITIAL  4.  Pt will improve TUG to </=30 secs w/ LRAD at S level in order to indicate dec fall risk   Baseline: 43.69 secs with RW and min A  Goal status: INITIAL  5.  Pt will improve BERG balance score by 8 points from baseline in order to indicate dec fall risk.  Baseline: Did not assess yet  Goal status: INITIAL  6.  Will initiate gait training with quad tip cane/forearm crutch when appropriate and update goal.   Baseline: not assessed at this time  Goal status: INITIAL   PLAN:  PT FREQUENCY: 2x/week  PT DURATION: 8 weeks  PLANNED INTERVENTIONS: Therapeutic  exercises, Therapeutic activity, Neuromuscular re-education, Balance training, Gait training, Patient/Family education, Self Care, Stair training, Vestibular training, Prosthetic training, DME instructions, and Manual therapy  PLAN FOR NEXT SESSION: Assess BERG, initiate HEP for standing balance (sink HEP), hamstring stretching/hip flex stretch, prosthetic education (esp sock adjustment), gait with RW, obstacles, stairs/ramp/curb   Harriet Butte, PT, MPT [facility] [address] [place], [place], [address] Phone: [phone]   Fax:   [phone] 11/14/22, 2:21 PM    Check all possible CPT codes: 97110- Therapeutic Exercise, [phone]- Neuro Re-education, [phone] - Gait Training, [phone] - Manual Therapy, 97530 - Therapeutic Activities, [phone] - Self Care, and [phone] - Prosthetic training    Check all conditions that are expected to impact treatment: Diabetes mellitus   If treatment provided at initial evaluation, no treatment charged due to lack of authorization.

## 2022-11-14 NOTE — Telephone Encounter (Signed)
Dr. Wynetta Emery,   I evaluated Scott Greer today here at OP neuro for PT.  He would greatly benefit from a RW to help with gait training at home.  Please write order in epic work que and I can give this to him.    Thanks Cameron Sprang, PT, MPT [facility] [address] [place], [place], [address] Phone: [phone]   Fax:  [phone] 11/14/22, 2:34 PM

## 2022-11-15 ENCOUNTER — Ambulatory Visit: Payer: Self-pay | Admitting: *Deleted

## 2022-11-15 NOTE — Telephone Encounter (Signed)
Spoke with Dr. Joya Gaskins . Dr. Joya Gaskins recommendation is for the patient to be evaluated in the ED. Transportion to ED being arrangedMarcia Lovena Le, SW at [facility]

## 2022-11-15 NOTE — Telephone Encounter (Signed)
Called The follow-up with [facility]. Message along with contact information left.

## 2022-11-15 NOTE — Telephone Encounter (Signed)
  Chief Complaint: [place] nurse from [facility] reports worsening in left hand and arm.  Symptoms: patient not with caller. BP 150/71 last check today. During assessment with patient today patient reports worsening weakness in left arm but did not report "for a while". Patient is now homeless and going to physical therapy learning to use prothesis soon. Reports no other neurological sx. Frequency: patient reported to RN " a while ago" Pertinent Negatives: Patient denies neurological sx  Disposition: [x] ED /[] Urgent Care (no appt availability in office) / [] Appointment(In office/virtual)/ []  [facility]/ [] Home Care/ [] Refused Recommended Disposition /[] [facility]/ []  Follow-up with PCP Additional Notes:   Recommended to [facility], RN to recommended if sx worsening to be evaluated at ED. Tye Maryland reports no new sx and can schedule appt with Dr. Joya Gaskins in clinic next Wed. Please advise regarding if patient can be seen in [place]. Tye Maryland, RN [phone].    Reason for Disposition  [1] Weakness of the face, arm / hand, or leg / foot on one side of the body AND [2] gradual onset (e.g., days to weeks) AND [3] present now  Answer Assessment - Initial Assessment Questions 1. SYMPTOM: "What is the main symptom you are concerned about?" (e.g., weakness, numbness)     Cathy, RN called to report patient reports worsening weakness in left arm and hand.  2. ONSET: "When did this start?" (minutes, hours, days; while sleeping)     " A while ago " but did not report to anyone  3. LAST NORMAL: "When was the last time you (the patient) were normal (no symptoms)?"     na 4. PATTERN "Does this come and go, or has it been constant since it started?"  "Is it present now?"     Unknown  5. CARDIAC SYMPTOMS: "Have you had any of the following symptoms: chest pain, difficulty breathing, palpitations?"     None reported from [place], [place] not with patient at this time. 6. NEUROLOGIC  SYMPTOMS: "Have you had any of the following symptoms: headache, dizziness, vision loss, double vision, changes in speech, unsteady on your feet?"     Reports  7. OTHER SYMPTOMS: "Do you have any other symptoms?"     no 8. PREGNANCY: "Is there any chance you are pregnant?" "When was your last menstrual period?"     na  Protocols used: Neurologic Deficit-A-AH

## 2022-11-15 NOTE — Congregational Nurse Program (Signed)
  Dept: [phone]   Congregational Nurse Program Note  Date of Encounter: 11/15/2022  Clinic visit for complaint of weakness left hand that he has had but is somewhat worse since he began going to physical therapy.  BP 150/71, pulse 78 and regular, 98%. Spoke with Angie nurse at Dr. Durenda Age office, she will notify Dr. Wynetta Emery of this change and call patient with appointment information. Past Medical History: Past Medical History:  Diagnosis Date   CHF (congestive heart failure) ([place])    Dehiscence of amputation stump ([place])    left great toe   Diabetes mellitus without complication ([place]) 2683   Type II   Glaucoma 2015   Hyperlipidemia    Hypertension 2005   Left foot infection 02/07/2021   Osteomyelitis ([place])    Wears glasses     Encounter Details:  CNP Questionnaire - 11/15/22 1130       Questionnaire   Ask client: Do you give verbal consent for me to treat you today? Yes    Student Assistance N/A    Location Patient [facility]    Visit Setting with Client Organization    Patient Status Unhoused    Insurance Medicaid    Insurance/Financial Assistance Referral N/A    Medication Have Medication Insecurities    Medical Provider Yes    Screening Referrals Made N/A    Medical Referrals Made [facility]    Medical Appointment Made [facility] P[facility]    Recently w/o PCP, now 1st time PCP visit completed due to CNs referral or appointment made N/A    Food Have Food Insecurities    Transportation Need transportation assistance    Housing/Utilities No permanent housing    Interpersonal Safety Do not feel safe at current residence    Interventions Advocate/Support;Case Management;Counsel;Educate;Navigate Healthcare System    Abnormal to Normal Screening Since Last CN Visit N/A    Screenings CN Performed Blood Pressure;Pulse Ox    Sent Client to Lab for: N/A    Did client attend any of the following based off CNs referral or appointments made? N/A    ED Visit  Averted N/A    Life-Saving Intervention Made N/A

## 2022-11-16 ENCOUNTER — Telehealth: Payer: Self-pay | Admitting: *Deleted

## 2022-11-16 NOTE — Patient Outreach (Signed)
  Care Management   Note  11/16/2022 Name: Scott Greer MRN: [id number] DOB: [birth year]  [name] is enrolled in a Managed Medicaid plan: Yes. Outreach attempt today was unsuccessful.   RNCM attempting to return call to [name]. Patient left a message for [facility] with an updated telephone number. Number has been updated in EPIC. RNCM will reach out to [name] at next scheduled appointment.  Telephone follow up appointment with care management team member scheduled for:11/21/22 @ 3:30pm  [name] RN, [place] RN Care Coordinator

## 2022-11-19 ENCOUNTER — Ambulatory Visit: Payer: Medicaid Other | Admitting: Physical Therapy

## 2022-11-20 ENCOUNTER — Encounter: Payer: Self-pay | Admitting: *Deleted

## 2022-11-20 ENCOUNTER — Other Ambulatory Visit: Payer: Medicaid Other | Admitting: *Deleted

## 2022-11-20 NOTE — Patient Instructions (Signed)
Visit Information  Scott Greer was given information about Medicaid Managed Care team care coordination services as a part of their [facility] Medicaid benefit. Scott Greer verbally consented to engagement with the [facility] Managed Care team.   If you are experiencing a medical emergency, please call 911 or report to your local emergency department or urgent care.   If you have a non-emergency medical problem during routine business hours, please contact your provider's office and ask to speak with a nurse.   For questions related to your [place] health plan, please call: [phone] or go here:[email or web address]  If you would like to schedule transportation through your [facility] Greer plan, please call the following number at least 2 days in advance of your appointment: [phone].  You can also use the MTM portal or MTM mobile app to manage your rides. For the portal, please go to [email or web address].cy.  Call the [place] at [phone], at any time, 24 hours a day, 7 days a week. If you are in danger or need immediate medical attention call 911.  If you would like help to quit smoking, call 1-800-QUIT-NOW (720)128-8500) OR Espaol: 1-855-Djelo-Ya (2-694-854-6270) o para ms informacin haga clic aqu or Text READY to 200-400 to register via text  Scott Greer,   Please see education materials related to HF provided as print materials.   The patient verbalized understanding of instructions, educational materials, and care plan provided today and agreed to receive a mailed copy of patient instructions, educational materials, and care plan.   Telephone follow up appointment with Managed Medicaid care management team member scheduled for:11/28/22 @ 3:30pm  Scott Greer   Following is a copy of your plan of care:  Care Plan : Greer Care Manager Plan of Care  Updates made by Scott Montane, Greer since 11/20/2022 12:00 AM     Problem: Health Management needs related to DMII      Long-Range Goal: Development of Plan of Care to address Health Management needs related to DMII   Start Date: 02/27/2022  Expected End Date: 02/01/2023  Priority: High  Note:   Current Barriers:  Care Coordination needs related to Housing barriers  Chronic Disease Management support and education needs related to DMII, Homelessness, and Stress  Scott Greer continues to stay at [employer]. He has both prosthetic legs and is attending PT to learn to walk. His medications are delivered to Scott Greer. He has a new phone, but does not have a lot of his old contacts.  RNCM Clinical Goal(s):  Patient will verbalize understanding of plan for management of DMII as evidenced by patient report continue to work with Greer Care Manager and/or Social Worker to address care management and care coordination needs related to Homelessness and managing stress as evidenced by adherence to CM Team Scheduled appointments     through collaboration with Greer Care manager, provider, and care team.   Interventions: Inter-disciplinary care team collaboration (see longitudinal plan of care) Evaluation of current treatment plan related to  self management and patient's adherence to plan as established by provider Rescheduled with Scott Greer, Scott Greer on 11/26/22 @ 1:30pm Attempted to contact Scott Greer with [place] [phone] and Scott Greer with [facility] [phone] for any updates on placement Advised patient to answer his phone Provided patient with his sister's telephone number listed in his chart (from contacts) Provided therapeutic listening   Diabetes:  (Status: Goal  on Track (progressing): YES.) Long Term Goal   Lab Results  Component Value Date   HGBA1C 6.9 08/16/2022   @ Assessed patient's understanding of A1c goal: <7% Discussed plans with patient for ongoing care  management follow up and provided patient with direct contact information for care management team;      Referral made to social work team for assistance with managing stress and procuring housing ;      Assessed social determinant of health barriers;        Patient taking metformin once daily Discussed diabetic diet Reviewed upcoming appointment including: PT on Monday and Friday, Eye Exam on 12/10/22 and 12/21/22 wit PCP  Patient Goals/Self-Care Activities: Attend all scheduled provider appointments Call provider office for new concerns or questions  Work with the social worker to address care coordination needs and will continue to work with the clinical team to address health care and disease management related needs Follow up with housing resources

## 2022-11-20 NOTE — Patient Outreach (Signed)
Medicaid Managed Care   Nurse Care Manager Note  11/20/2022 Name:  Scott Greer MRN:  [id number] DOB:  1956-02-23  Scott Greer is an 67 y.o. year old male who is a primary patient of [name], MD.  The [facility] Managed Care Coordination team was consulted for assistance with:    DMII  Scott Greer was given information about Medicaid Managed Care Coordination team services today. [name] Patient agreed to services and verbal consent obtained.  Engaged with patient by telephone for follow up visit in response to provider referral for case management and/or care coordination services.   Assessments/Interventions:  Review of past medical history, allergies, medications, health status, including review of consultants reports, laboratory and other test data, was performed as part of comprehensive evaluation and provision of chronic care management services.  SDOH (Social Determinants of Health) assessments and interventions performed: SDOH Interventions    Flowsheet Row Patient Outreach Telephone from 11/20/2022 in [place] Patient Outreach Telephone from 06/26/2022 in [place] Patient Outreach Telephone from 04/16/2022 in [place] Patient Outreach Telephone from 03/06/2022 in [place] Patient Outreach Telephone from 02/27/2022 in [place] [employer] Telephone from 02/19/2022 in [place]  SDOH Interventions        Food Insecurity Interventions Intervention Not Indicated -- -- -- -- --  Housing Interventions Other (Comment)  [Collaborated with Patent attorney, [facility] and BSW] Other (Comment)  [Patient is currently homeless. RNCM collaborated with [employer]. [name] will follow up this afternoon after  contacting [facility]. [name] with Social Services with APS is now involved] Intervention Not Indicated  [Patient working with [employer] and an agent [name] to find housing. Currently living in [place].] -- -- Other (Comment)  [[facility] and [place] follow up]  Transportation Interventions -- -- Other (Comment)  [Provided with [employer] transportation [phone] Other (Comment)  [Provided with [employer] [phone] -- --  Stress Interventions -- -- -- -- Other (Comment)  [Referral to [place]  Allergies  Allergen Reactions   Bee Venom Hives, Itching and Swelling    Medications Reviewed Today     Reviewed by [name], RN (Registered Nurse) on 11/20/22 at [place] List Status: <None>   Medication Order Taking? Sig Documenting Provider Last Dose Status Informant  Accu-Chek Softclix Lancets lancets [id number]  Use to test blood sugars up to 4 times daily as directed.  Patient not taking: Reported on 10/25/2022   [name], MD  Active   amiodarone (PACERONE) 200 MG tablet [id number] No Take 1 tablet (200 mg total) by mouth daily. [name], PA-C Unknown Active   aspirin (ASPIRIN LOW DOSE) 81 MG chewable tablet [id number] Yes CHEW ONE TABLET BY MOUTH ONCE DAILY Needs appointment for further refills [name], PA-C Taking Active   atorvastatin (LIPITOR) 10 MG tablet [id number] Yes Take 1 tablet (10 mg total) by mouth every evening. [name], PA-C Taking Active   Blood Glucose Monitoring Suppl (ACCU-CHEK GUIDE) w/Device KIT [id number]  Use to test blood sugars up to 4 times daily as directed.  Patient not taking: Reported on 10/25/2022   [name], MD  Active   Blood Glucose Monitoring Suppl (TRUE METRIX METER) w/Device KIT [id number]  Use as directed  Patient not taking: Reported on 10/25/2022   [name], MD  Active Other  carvedilol (COREG) 3.125 MG tablet  [id number] Yes Take 1 tablet (3.125 mg total) by mouth 2 (two) times daily with a meal. [name], PA-C Taking Active   Continuous Blood Gluc Receiver (FREESTYLE LIBRE 2 READER) DEVI [id number] No UAD  Patient not taking: Reported on 10/25/2022   [name], MD Not Taking Active   Continuous Blood Gluc Sensor ([place]) [place] [id number] No Change sensor Q 2 wks  Patient not taking: Reported on 10/25/2022   [name], MD Not Taking Active   digoxin (LANOXIN) 0.125 MG tablet [id number] Yes TAKE ONE TABLET BY MOUTH EVERY EVENING WITH SUPPER [name], PA-C Taking Active   empagliflozin (JARDIANCE) 10 MG TABS tablet [id number] Yes TAKE ONE TABLET BY MOUTH AT [place], [place] Taking Active   ferrous sulfate (FEROSUL) 325 (65 FE) MG tablet [id number] Yes TAKE ONE TABLET BY MOUTH EVERY MORNING WITH BREAKFAST [name], MD Taking Active   gabapentin (NEURONTIN) 300 MG capsule [id number] Yes Take 1 capsule (300 mg total) by mouth 2 (two) times daily. [name], PA-C Taking Active   glucose blood (ACCU-CHEK GUIDE) test strip [id number]  Use to test blood sugars up to 4 times daily as directed.  Patient not taking: Reported on 10/25/2022   [name], MD  Active   isosorbide-hydrALAZINE (BIDIL) 20-37.5 MG tablet [id number] Yes TAKE ONE TABLET BY MOUTH Two TIMES DAILY [name], PA-C Taking Active   metFORMIN (GLUCOPHAGE) 500 MG tablet [id number] Yes Take 1 tablet (500 mg total) by mouth daily with breakfast. [name], PA-C Taking Active   nutrition supplement, [name]) PACK [id number] No Take 1 packet by mouth 2 (two) times daily between meals.  Patient not taking: Reported on 10/25/2022   Persons, [name], [name] Not Taking Active   sacubitril-valsartan (ENTRESTO) 24-26 MG [id number] No Take 1 tablet by mouth 2 (two) times daily. Absolute last refill without office visit please call [phone] to schedule  [name], PA-C Unknown Active   spironolactone (ALDACTONE) 25 MG tablet [id number] Yes TAKE ONE TABLET BY MOUTH EVERY MORNING [name], PA-C Taking Active   torsemide (DEMADEX) 20 MG tablet [id number] Yes TAKE ONE TABLET BY MOUTH ONCE DAILY [name], PA-C Taking Active   zinc sulfate 220 (50 Zn) MG capsule [id number] No Take 1 capsule (220 mg total) by mouth daily.  Patient not taking: Reported on 10/25/2022   Persons, [name], [name] Not Taking Active             Patient Active Problem List   Diagnosis Date Noted   Chronic combined systolic and diastolic CHF (congestive heart failure) ([place]) 12/01/2021   S/P BKA (below knee amputation) bilateral ([place]) 07/18/2021   Gangrene of right foot (HCC)    Foot pain, right 04/14/2021   Status post below-knee amputation ([place]) 02/17/2021   Wound dehiscence    Gangrene of toe of left foot ([place])    Cutaneous abscess of left foot    Left foot infection 02/07/2021   Leukocytosis 02/07/2021   Thrombocytosis 02/07/2021   Hyponatremia 02/07/2021   AKI (acute kidney injury) ([place]) 02/07/2021   Hyperglycemia due to diabetes mellitus ([place]) 02/07/2021   New onset of congestive heart failure ([place]) 12/26/2020   CKD (chronic kidney disease) stage 3, GFR 30-59 ml/min (HCC) [id number]   Acute systolic CHF (congestive heart  failure) (HCC) 12/26/2020   Anemia, chronic disease 06/22/2020   Amputee, great toe, right (HCC) 06/20/2020   Normocytic anemia 06/20/2020   Erectile dysfunction associated with type 2 diabetes mellitus (HCC) 06/20/2020   Positive for macroalbuminuria 10/24/2019   Amputation of left great toe (HCC) 10/23/2019   Tobacco dependence 10/23/2019   Diabetic foot infection (HCC)    Noncompliance    Glaucoma    Hyperlipidemia    Essential hypertension 11/06/2003   Diabetes mellitus (HCC) 11/05/1997    Conditions to be addressed/monitored per PCP order:  DMII and Homelessness  Care Plan : RN Care Manager Plan of  Care  Updates made by Heidi Dach, RN since 11/20/2022 12:00 AM     Problem: Health Management needs related to DMII      Long-Range Goal: Development of Plan of Care to address Health Management needs related to DMII   Start Date: 02/27/2022  Expected End Date: 02/01/2023  Priority: High  Note:   Current Barriers:  Care Coordination needs related to Housing barriers  Chronic Disease Management support and education needs related to DMII, Homelessness, and Stress  Mr. Hopwood continues to stay at [employer]. He has both prosthetic legs and is attending PT to learn to walk. His medications are delivered to [facility]. He has a new phone, but does not have a lot of his old contacts.  RNCM Clinical Goal(s):  Patient will verbalize understanding of plan for management of DMII as evidenced by patient report continue to work with RN Care Manager and/or Social Worker to address care management and care coordination needs related to Homelessness and managing stress as evidenced by adherence to CM Team Scheduled appointments     through collaboration with RN Care manager, provider, and care team.   Interventions: Inter-disciplinary care team collaboration (see longitudinal plan of care) Evaluation of current treatment plan related to  self management and patient's adherence to plan as established by provider Rescheduled with Jon Gills, BSW on 11/26/22 @ 1:30pm Attempted to contact [name] with Disability [facility] [phone] and Shanda Bumps with [facility] [phone] for any updates on placement Advised patient to answer his phone Provided patient with his sister's telephone number listed in his chart (from contacts) Provided therapeutic listening   Diabetes:  (Status: Goal on Track (progressing): YES.) Long Term Goal   Lab Results  Component Value Date   HGBA1C 6.9 08/16/2022   @ Assessed patient's understanding of A1c goal: <7% Discussed plans with  patient for ongoing care management follow up and provided patient with direct contact information for care management team;      Referral made to social work team for assistance with managing stress and procuring housing ;      Assessed social determinant of health barriers;        Patient taking metformin once daily Discussed diabetic diet Reviewed upcoming appointment including: PT on Monday and Friday, Eye Exam on 12/10/22 and 12/21/22 wit PCP  Patient Goals/Self-Care Activities: Attend all scheduled provider appointments Call provider office for new concerns or questions  Work with the social worker to address care coordination needs and will continue to work with the clinical team to address health care and disease management related needs Follow up with housing resources       Follow Up:  Patient agrees to Care Plan and Follow-up.  Plan: The Managed Medicaid care management team will reach out to the patient again over the next 14 days.  Date/time of next  scheduled RN care management/care coordination outreach:  11/28/22 @ 3:30pm  [name] RN, BSN [place] RN Care Coordinator

## 2022-11-21 ENCOUNTER — Ambulatory Visit: Payer: Medicaid Other | Admitting: *Deleted

## 2022-11-22 ENCOUNTER — Encounter: Payer: Self-pay | Admitting: Physician Assistant

## 2022-11-22 NOTE — Progress Notes (Addendum)
Pt seen 11/21/2022  Pt still using a wheelchair, but has both prosthetic legs on.  He 1 physical therapy appointment and will go back.  He missed a second appointment because of scheduling issues.  He is upset because of frequent urination.  The need for him to take multiple medications including a diuretic was reinforced, because his EF was 23% by MRI 01/2021.  He has RV dysfunction as well.  He complains of some left hand weakness.  He worries about the circulation.  Pulses were checked and capillary refill was checked in both hands and all fingers, radial pulses are 2+ and he has good capillary refill. He is still using the hand cream w/ improvement in his calluses.  Grip strength is equal, he is not weak.  He uses his wheelchair very effectively and he is not limited.  The source of his symptoms is unclear, but he says this has been going on for several months and not worsened.  I do not believe urgent evaluation is indicated, discuss further evaluation with Dr. Joya Gaskins.   He was given Voltaren gel to use on his had to see if it helps.  His meds were reviewed, he is still getting lunchtime doses of Bidil and he says he is compliant with medications.  [facility] was contacted but we could not get anyone to answer the phone.  Old medications were sorted out from his January medications and he was advised not to take them.  Today's Vitals   11/22/22 1720  BP: 135/68  Pulse: 77  SpO2: 100%   There is no height or weight on file to calculate BMI.  Volume status good by exam  Rosaria Ferries, PA-C 11/22/2022 2:48 PM

## 2022-11-23 ENCOUNTER — Ambulatory Visit: Payer: Medicaid Other | Admitting: Physical Therapy

## 2022-11-26 ENCOUNTER — Other Ambulatory Visit: Payer: Medicaid Other

## 2022-11-26 ENCOUNTER — Ambulatory Visit: Payer: Medicaid Other | Admitting: Physical Therapy

## 2022-11-26 VITALS — BP 154/73 | HR 68

## 2022-11-26 DIAGNOSIS — R2681 Unsteadiness on feet: Secondary | ICD-10-CM | POA: Diagnosis not present

## 2022-11-26 DIAGNOSIS — R2689 Other abnormalities of gait and mobility: Secondary | ICD-10-CM

## 2022-11-26 DIAGNOSIS — Z89512 Acquired absence of left leg below knee: Secondary | ICD-10-CM | POA: Diagnosis not present

## 2022-11-26 DIAGNOSIS — Z89511 Acquired absence of right leg below knee: Secondary | ICD-10-CM | POA: Diagnosis not present

## 2022-11-26 DIAGNOSIS — M6281 Muscle weakness (generalized): Secondary | ICD-10-CM | POA: Diagnosis not present

## 2022-11-26 DIAGNOSIS — R293 Abnormal posture: Secondary | ICD-10-CM | POA: Diagnosis not present

## 2022-11-26 NOTE — Therapy (Signed)
OUTPATIENT PHYSICAL THERAPY PROSTHETICS TREATMENT   Patient Name: Scott Greer MRN: [id number] DOB:1955/12/05, 67 y.o., male Today's Date: 11/26/2022  PCP: Karle Plumber, MD REFERRING PROVIDER: Karle Plumber, MD   END OF SESSION:  PT End of Session - 11/26/22 1322     Visit Number 2    Number of Visits 17    Date for PT Re-Evaluation 01/13/23    Authorization Type [employer] (27 v limit)    Authorization - Number of Visits 24    PT Start Time [id number]    PT Stop Time 1400    PT Time Calculation (min) 42 min    Equipment Utilized During Treatment Gait belt    Activity Tolerance Patient tolerated treatment well    Behavior During Therapy WFL for tasks assessed/performed              Past Medical History:  Diagnosis Date   CHF (congestive heart failure) ([place])    Dehiscence of amputation stump ([place])    left great toe   Diabetes mellitus without complication ([place]) [id number]   Type II   Glaucoma 2015   Hyperlipidemia    Hypertension 2005   Left foot infection 02/07/2021   Osteomyelitis ([place])    Wears glasses    Past Surgical History:  Procedure Laterality Date   AMPUTATION Left 06/05/2019   Procedure: LEFT GREAT TOE AMPUTATION;  Surgeon: Newt Minion, MD;  Location: [facility];  Service: Orthopedics;  Laterality: Left;   AMPUTATION Left 07/10/2019   Procedure: LEFT FOOT 1ST RAY AMPUTATION;  Surgeon: Newt Minion, MD;  Location: [place];  Service: Orthopedics;  Laterality: Left;   AMPUTATION Right 05/25/2020   Procedure: RIGHT GREAT TOE AMPUTATION;  Surgeon: Newt Minion, MD;  Location: [place];  Service: Orthopedics;  Laterality: Right;   AMPUTATION Left 01/25/2021   Procedure: LEFT 2ND TOE AMPUTATION;  Surgeon: Newt Minion, MD;  Location: [place];  Service: Orthopedics;  Laterality: Left;   AMPUTATION Left 02/08/2021   Procedure: LEFT TRANSMETATARSAL AMPUTATION;  Surgeon: Newt Minion, MD;  Location: [place];  Service: Orthopedics;  Laterality: Left;   AMPUTATION  Left 02/10/2021   Procedure: AMPUTATION BELOW KNEE;  Surgeon: Newt Minion, MD;  Location: [place];  Service: Orthopedics;  Laterality: Left;   AMPUTATION Right 04/14/2021   Procedure: RIGHT 1ST AND 2ND RAY AMPUTATION;  Surgeon: Newt Minion, MD;  Location: [facility];  Service: Orthopedics;  Laterality: Right;   AMPUTATION Right 04/26/2021   Procedure: RIGHT BELOW KNEE AMPUTATION;  Surgeon: Newt Minion, MD;  Location: [place];  Service: Orthopedics;  Laterality: Right;   NO PAST SURGERIES     RIGHT/LEFT HEART CATH AND CORONARY ANGIOGRAPHY N/A 01/02/2021   Procedure: RIGHT/LEFT HEART CATH AND CORONARY ANGIOGRAPHY;  Surgeon: Lorretta Harp, MD;  Location: [facility];  Service: Cardiovascular;  Laterality: N/A;   Patient Active Problem List   Diagnosis Date Noted   Chronic combined systolic and diastolic CHF (congestive heart failure) ([place]) 12/01/2021   S/P BKA (below knee amputation) bilateral ([place]) 07/18/2021   Gangrene of right foot (HCC)    Foot pain, right 04/14/2021   Status post below-knee amputation ([place]) 02/17/2021   Wound dehiscence    Gangrene of toe of left foot (HCC)    Cutaneous abscess of left foot    Left foot infection 02/07/2021   Leukocytosis 02/07/2021   Thrombocytosis 02/07/2021   Hyponatremia 02/07/2021   AKI (acute kidney injury) ([place])  02/07/2021   Hyperglycemia due to diabetes mellitus (HCC) 02/07/2021   New onset of congestive heart failure (HCC) 12/26/2020   CKD (chronic kidney disease) stage 3, GFR 30-59 ml/min (HCC) 12/26/2020   Acute systolic CHF (congestive heart failure) (HCC) 12/26/2020   Anemia, chronic disease 06/22/2020   Amputee, great toe, right (HCC) 06/20/2020   Normocytic anemia 06/20/2020   Erectile dysfunction associated with type 2 diabetes mellitus (HCC) 06/20/2020   Positive for macroalbuminuria 10/24/2019   Amputation of left great toe (HCC) 10/23/2019   Tobacco dependence 10/23/2019   Diabetic foot infection (HCC)    Noncompliance     Glaucoma    Hyperlipidemia    Essential hypertension 11/06/2003   Diabetes mellitus (HCC) 11/05/1997    ONSET DATE: 11/02/2022 (referral date) (He reports he got legs on 12/26)  REFERRING DIAG: Z89.512,Z89.511 (ICD-10-CM) - S/P BKA (below knee amputation) bilateral (HCC)  THERAPY DIAG:  Other abnormalities of gait and mobility  Unsteadiness on feet  Rationale for Evaluation and Treatment: Rehabilitation  SUBJECTIVE:   SUBJECTIVE STATEMENT: Pt reports he missed the past two PT appointments because his transportation was not set up. Is grateful he could be here today.    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  left BKA on 02/10/21 and R BKA on 04/26/21.   PAIN:  Are you having pain? No  VITALS  Vitals:   11/26/22 1331  BP: (!) 154/73  Pulse: 68     PRECAUTIONS: Fall  WEIGHT BEARING RESTRICTIONS: No  FALLS: Has patient fallen in last 6 months? Yes. Number of falls a few   LIVING ENVIRONMENT: Lives with: lives with their family and is homeless is living at [place] [facility]  Lives in: Homeless shelter Home Access: Elevator Home layout: Two level Stairs: No Has following equipment at home: Wheelchair (manual)  PLOF: Independent with basic ADLs, Independent with community mobility with device, and Independent with homemaking with wheelchair  PATIENT GOALS: "I want to be able to get my own apartment.  I want to walk independently with these legs."  OBJECTIVE:   DIAGNOSTIC FINDINGS: n/a  COGNITION: Overall cognitive status: Within functional limits for tasks assessed   SENSATION: WFL  MUSCLE LENGTH: Some tightness noted in B knees in standing, unable to achieve full knee extension, lacking approx 10 deg in standing.    POSTURE: rounded shoulders, forward head, and flexed trunk    LOWER EXTREMITY MMT:  MMT  Right eval Left eval  Hip flexion 4/5 4/5  Hip extension    Hip abduction 4/5 4/5  Hip adduction 4/5 4/5  Hip internal rotation    Hip external rotation    Knee flexion 4/5 4/5  Knee extension 4/5 4/5  Ankle dorsiflexion    Ankle plantarflexion    Ankle inversion    Ankle eversion    (Blank rows = not tested) All tested in seated position.   TODAY'S TREATMENT:  Ther Ex  Reviewed sink amputee HEP (see below). Struck out item 3 due to limited availability in shelter.    Do each exercise 1-2  times per day Do each exercise 5-10 repetitions Hold each exercise for 2 seconds to feel your location  AT SINK FIND YOUR MIDLINE POSITION AND PLACE FEET EQUAL DISTANCE FROM THE MIDLINE.  Try to find this position when standing still for activities.   USE TAPE ON FLOOR TO  MARK THE MIDLINE POSITION which is even with middle of sink.  You also should try to feel with your limb pressure in socket.  You are trying to feel with limb what you used to feel with the bottom of your foot.  Side to Side Shift: Moving your hips only (not shoulders): move weight onto your left leg, HOLD/FEEL pressure in socket.  Move back to equal weight on each leg, HOLD/FEEL pressure in socket. Move weight onto your right leg, HOLD/FEEL pressure in socket. Move back to equal weight on each leg, HOLD/FEEL pressure in socket. Repeat.  Start with both hands on sink, progress to hand on prosthetic side only, then no hands.  Front to Back Shift: Moving your hips only (not shoulders): move your weight forward onto your toes, HOLD/FEEL pressure in socket. Move your weight back to equal Flat Foot on both legs, HOLD/FEEL  pressure in socket. Move your weight back onto your heels, HOLD/FEEL  pressure in socket. Move your weight back to equal on both legs, HOLD/FEEL  pressure in socket. Repeat.  Start with both hands on sink, progress to hand on prosthetic side only, then no hands.  Overhead/Upward Reaching: alternated reaching up to  top cabinets or ceiling if no cabinets present. Keep equal weight on each leg. Start with one hand support on counter while other hand reaches and progress to no hand support with reaching.  ace one hand in middle of sink and reach with other hand. Do both arms.  Then hover one hand and move cups with other hand.  5.   Looking Over Shoulders: With equal weight on each leg: alternate turning to look over your shoulders with one hand support on counter as needed.  Start with head motions only to look in front of shoulder, then even with shoulder and progress to looking behind you. To look to side, move head /eyes, then shoulder on side looking pulls back, shift more weight to side looking and pull hip back. Place one hand in middle of sink and let go with other hand so your shoulder can pull back. Switch hands to look other way.   Then hover one hand and look over shoulder. If looking right, use left hand at sink. If looking left, use right hand at sink.  Gait Training  TRANSFERS: Sit to stand: CGA Stand to sit: CGA Cued pt to move leg rests prior to standing to RW in order to properly place feet. Pt able to manage leg rests independently and required CGA for RW management    GAIT: Gait pattern: step through pattern, decreased stride length, knee flexed in stance- Right, knee flexed in stance- Left, trunk flexed, and narrow BOS Distance walked: 230'  Assistive device utilized: Environmental consultant - 2 wheeled Level of assistance: SBA and CGA Comments: Pt able to ambulate 230' without rest breaks and heavy reliance on RW. No scissoring of gait noted but did note decreased knee extension and TO.    **Of note, pt soaked in urine today and states he is unable to get WC into bathroom well so urinates on himself and tries to change at night. Educated pt on skin breakdown/infection risk with this and to ask for portable urinal to use until he obtains RW and can get to bathroom. Pt verbalized understanding and declined  change of clothes in clinic.    CURRENT PROSTHETIC WEAR ASSESSMENT: Patient is independent with: skin check and residual limb care Patient is dependent with: prosthetic cleaning, ply sock cleaning, correct  ply sock adjustment, proper wear schedule/adjustment, and proper weight-bearing schedule/adjustment Donning prosthesis: Min A Doffing prosthesis: Min A (needed more assist today as fabric got caught in pin) Prosthetic wear tolerance: Reports he is wearing for about 30 mins a few times day  Prosthetic weight bearing tolerance: 2-3 minutes Edema: none noted  Residual limb condition: Pts skin is in excellent condition, good hydration and hair growth.  Prosthetic description: pin lock suspension with flexible inner socket.  He does 1 ply over this and then 5 ply over silicone liner.   K code/activity level with prosthetic use: Level 3     PATIENT EDUCATED ON FOLLOWING PROSTHETIC CARE: Prosthetic wear tolerance: 2 hours, 2x/day, 7 days/week Prosthetic weight bearing tolerance: 3-5 minutes Other education  Correct ply sock adjustment, Propper donning, Proper doffing, Proper wear schedule/adjustment, and Proper weight-bearing schedule/adjustment  PATIENT EDUCATION: Education details:Initial HEP, information on RW faxed to [employer], asking for bedside urinal to use until he obtains RW  Person educated: Patient Education method: Programmer, multimedia, Demonstration, and Verbal cues Education comprehension: verbalized understanding and needs further education  HOME EXERCISE PROGRAM: Amputee sink HEP  ASSESSMENT:  CLINICAL IMPRESSION: Emphasis of skilled PT session on establishing initial HEP and gait training. Pt performed HEP and had no difficulty w/movements, could likely benefit from more challenging exercises in future sessions. Pt ambulated 230' w/RW at [facility] level today without pain or fatigue. Noted pt soaked in urine today, educated need to ask for bedside urinal for use at shelter to reduce  risk of skin breakdown/infection. Continue POC.   OBJECTIVE IMPAIRMENTS: Abnormal gait, decreased activity tolerance, decreased balance, decreased knowledge of use of DME, decreased mobility, decreased ROM, decreased strength, impaired perceived functional ability, impaired flexibility, impaired UE functional use, postural dysfunction, and prosthetic dependency .   ACTIVITY LIMITATIONS: carrying, lifting, bending, standing, squatting, stairs, transfers, bathing, and locomotion level  PARTICIPATION LIMITATIONS: meal prep, cleaning, laundry, driving, shopping, and community activity  PERSONAL FACTORS: Past/current experiences and 3+ comorbidities: see above  are also affecting patient's functional outcome.   REHAB POTENTIAL: Good  CLINICAL DECISION MAKING: Evolving/moderate complexity  EVALUATION COMPLEXITY: Moderate   GOALS: Goals reviewed with patient? Yes  SHORT TERM GOALS: Target date: 12/15/22  Pt will be IND with initial HEP in order to indicate improved functional mobility and dec fall risk. Baseline: None given at this time Goal status: INITIAL  2.  Pt will improve gait speed to >/=1.8 ft/sec w/ RW in order to indicate dec fall risk.  Baseline: 1.13 ft/sec with RW Goal status: INITIAL  3.  Pt will ambulate 150' with RW at S level in order to indicate more independent household ambulation.   Baseline: 12' with RW at min A  Goal status: INITIAL  4.  Pt will improve TUG to </=38 secs w/ RW at S level in order to indicate dec fall risk   Baseline: 43.69 secs with RW and min A Goal status: INITIAL  5.  Pt will report wearing B prostheses 60% of awake hours, donning and doffing independently without skin issues.   Baseline: only wearing 30 mins a few times a day, needs some assist to don appropriately  Goal status: INITIAL  6.  Will assess BERG balance test and improve score by 4 points from baseline in order to indicate dec fall risk.  Baseline: did not assess at this  time  Goal status: INITIAL  LONG TERM GOALS: Target date: 01/13/23  Pt will be IND with final HEP in order to indicate  improved functional mobility and dec fall risk. Baseline: none initiated at this time  Goal status: INITIAL  2.  Pt will improve gait speed to >/=2.62 ft/sec w/ RW in order to indicate dec fall risk.  Baseline: 1.13 ft/sec with RW and min A  Goal status: INITIAL  3.  Pt will negotiate up/down 4 steps with B rail, up/down ramp and curb and ambulate x 500' outdoors over unlevel paved surfaces with RW at S level in order to indicate improved community mobility.   Baseline: Not assessed due to time constraint Goal status: INITIAL  4.  Pt will improve TUG to </=30 secs w/ LRAD at S level in order to indicate dec fall risk   Baseline: 43.69 secs with RW and min A  Goal status: INITIAL  5.  Pt will improve BERG balance score by 8 points from baseline in order to indicate dec fall risk.  Baseline: Did not assess yet  Goal status: INITIAL  6.  Will initiate gait training with quad tip cane/forearm crutch when appropriate and update goal.   Baseline: not assessed at this time  Goal status: INITIAL   PLAN:  PT FREQUENCY: 2x/week  PT DURATION: 8 weeks  PLANNED INTERVENTIONS: Therapeutic exercises, Therapeutic activity, Neuromuscular re-education, Balance training, Gait training, Patient/Family education, Self Care, Stair training, Vestibular training, Prosthetic training, DME instructions, and Manual therapy  PLAN FOR NEXT SESSION: Assess BERG (or maybe another balance test?), add to HEP for standing balance, hamstring stretching/hip flex stretch (sink HEP was pretty easy but maybe I did it wrong), prosthetic education (esp sock adjustment), gait with RW, obstacles, stairs/ramp/curb, endurance    Cruzita Lederer Eboney Claybrook, PT, DPT [place] [address] [place],   [address] Phone:  [phone] Fax:  [phone] 11/26/22, 2:02  PM    Check all possible CPT codes: 97110- Therapeutic Exercise, [phone]- Neuro Re-education, [phone] - Gait Training, [phone] - Manual Therapy, 97530 - Therapeutic Activities, [phone] - [place], and [phone] - Prosthetic training    Check all conditions that are expected to impact treatment: Diabetes mellitus   If treatment provided at initial evaluation, no treatment charged due to lack of authorization.

## 2022-11-26 NOTE — Patient Outreach (Signed)
  Medicaid Managed Care   Unsuccessful Outreach Note  11/26/2022 Name: Scott Greer MRN: [id number] DOB: 08/02/1956  Referred by: Johnson, Deborah B, MD Reason for referral : High Risk Managed Medicaid (MM Social work telephone outreach )   An unsuccessful telephone outreach was attempted today. The patient was referred to the case management team for assistance with care management and care coordination.   Follow Up Plan: A HIPAA compliant phone message was left for the patient providing contact information and requesting a return call.   Ijanae Macapagal, BSW, MHA [facility]  [place]  High Risk Managed Medicaid Team  [phone]  

## 2022-11-26 NOTE — Patient Instructions (Signed)
  Medicaid Managed Care   Unsuccessful Outreach Note  11/26/2022 Name: Scott Greer MRN: [id number] DOB: 02/03/1956  Referred by: [name], MD Reason for referral : High Risk Managed Medicaid (MM Social work telephone outreach )   An unsuccessful telephone outreach was attempted today. The patient was referred to the case management team for assistance with care management and care coordination.   Follow Up Plan: A HIPAA compliant phone message was left for the patient providing contact information and requesting a return call.   Mickel Fuchs, BSW, [place] Managed Medicaid Team  [phone]

## 2022-11-28 ENCOUNTER — Encounter: Payer: Self-pay | Admitting: *Deleted

## 2022-11-28 ENCOUNTER — Other Ambulatory Visit: Payer: Medicaid Other | Admitting: *Deleted

## 2022-11-28 DIAGNOSIS — Z89511 Acquired absence of right leg below knee: Secondary | ICD-10-CM | POA: Diagnosis not present

## 2022-11-28 DIAGNOSIS — Z89512 Acquired absence of left leg below knee: Secondary | ICD-10-CM | POA: Diagnosis not present

## 2022-11-28 NOTE — Congregational Nurse Program (Signed)
  Dept: [phone]   Congregational Nurse Program Note  Date of Encounter: 11/28/2022  Visit for assistance with medications and to check blood pressure.  Had not taken medications due to prepackaged meds having 2 packages for each morning, reminded to take meds from both morning pill package. BP 165/81, pulse 70 and regular, O2 Sat 100%.  Assisted with review of potential apartments from list he had been given that would fit his disability. Past Medical History: Past Medical History:  Diagnosis Date   CHF (congestive heart failure) ([place])    Dehiscence of amputation stump ([place])    left great toe   Diabetes mellitus without complication ([place]) 6433   Type II   Glaucoma 2015   Hyperlipidemia    Hypertension 2005   Left foot infection 02/07/2021   Osteomyelitis ([place])    Wears glasses     Encounter Details:  CNP Questionnaire - 11/27/22 1150       Questionnaire   Ask client: Do you give verbal consent for me to treat you today? Yes    Student Assistance N/A    Location Patient [facility]    Visit Setting with Client Organization    Patient Status Unhoused    Insurance Medicaid    Insurance/Financial Assistance Referral N/A    Medication Have Medication Insecurities    Medical Provider Yes    Screening Referrals Made N/A    Medical Referrals Made N/A    Medical Appointment Made N/A    Recently w/o PCP, now 1st time PCP visit completed due to CNs referral or appointment made N/A    Food Have Food Insecurities    Transportation Need transportation assistance    Housing/Utilities No permanent housing    Interpersonal Safety Do not feel safe at current residence    Interventions Advocate/Support;Counsel;Case Management;Reviewed Medications    Abnormal to Normal Screening Since Last CN Visit N/A    Screenings CN Performed Blood Pressure;Pulse Ox    Sent Client to Lab for: N/A    Did client attend any of the following based off CNs referral or appointments made? N/A     ED Visit Averted N/A    Life-Saving Intervention Made N/A

## 2022-11-28 NOTE — Patient Instructions (Signed)
Visit Information  Mr. Wotton was given information about Medicaid Managed Care team care coordination services as a part of their [facility] Medicaid benefit. [name] verbally consented to engagement with the [facility] Managed Care team.   If you are experiencing a medical emergency, please call 911 or report to your local emergency department or urgent care.   If you have a non-emergency medical problem during routine business hours, please contact your provider's office and ask to speak with a nurse.   For questions related to your [facility] health plan, please call: [phone] or go here:[email or web address]  If you would like to schedule transportation through your [facility] plan, please call the following number at least 2 days in advance of your appointment: [phone].  You can also use the MTM portal or MTM mobile app to manage your rides. For the portal, please go to [email or web address].  Call the [place] at [phone], at any time, 24 hours a day, 7 days a week. If you are in danger or need immediate medical attention call 911.  If you would like help to quit smoking, call 1-800-QUIT-NOW 610 804 5968) OR Espaol: 1-855-Djelo-Ya (5-427-062-3762) o para ms informacin haga clic aqu or Text READY to 200-400 to register via text  Mr. Ryner,    The patient verbalized understanding of instructions, educational materials, and care plan provided today and agreed to receive a mailed copy of patient instructions, educational materials, and care plan.   Telephone follow up appointment with Managed Medicaid care management team member scheduled for:12/31/22 @ 2:30pm  [name] RN, BSN [place] RN Care Coordinator   Following is a copy of your plan of care:  Care Plan : RN Care Manager Plan of Care  Updates made by [name], RN since 11/28/2022 12:00 AM     Problem: Health Management  needs related to DMII      Long-Range Goal: Development of Plan of Care to address Health Management needs related to DMII   Start Date: 02/27/2022  Expected End Date: 02/01/2023  Priority: High  Note:   Current Barriers:  Care Coordination needs related to Housing barriers  Chronic Disease Management support and education needs related to DMII, Homelessness, and Stress  Mr. Vanderhoof continues to stay at [facility]. He has both prosthetic legs and is attending PT to learn to walk. His medications are delivered to [facility]. He continues to desire housing. He did not receive communication with Janett Billow or [name] for housing resources. Today he is at his cousins apartment visiting.  RNCM Clinical Goal(s):  Patient will verbalize understanding of plan for management of DMII as evidenced by patient report continue to work with RN Care Manager and/or Social Worker to address care management and care coordination needs related to Homelessness and managing stress as evidenced by adherence to CM Team Scheduled appointments     through collaboration with RN Care manager, provider, and care team.   Interventions: Inter-disciplinary care team collaboration (see longitudinal plan of care) Evaluation of current treatment plan related to  self management and patient's adherence to plan as established by provider Advised patient to answer his phone Rescheduled missed telephone visit with BSW, scheduled on 11/29/22 @ 12:30pm Provided therapeutic listening Reviewed PT note and electric wheelchair fitting note   Diabetes:  (Status: Goal on Track (progressing): YES.) Long Term Goal   Lab Results  Component Value Date   HGBA1C 6.9 08/16/2022   @ Assessed patient's understanding of  A1c goal: <7% Discussed plans with patient for ongoing care management follow up and provided patient with direct contact information for care management team;      Referral made to social work team for assistance with  managing stress and procuring housing ;      Assessed social determinant of health barriers;        Unable to review medications, Patient is taking prepackaged medications Reviewed upcoming appointment including: PT on Monday and Friday, Eye Exam on 12/10/22 and 12/21/22 wit PCP  Patient Goals/Self-Care Activities: Attend all scheduled provider appointments Call provider office for new concerns or questions  Work with the social worker to address care coordination needs and will continue to work with the clinical team to address health care and disease management related needs Follow up with housing resources

## 2022-11-28 NOTE — Congregational Nurse Program (Signed)
  Dept: [phone]   Congregational Nurse Program Note  Date of Encounter: 11/20/2022  Came to clinic for update on schedule for physical therapy appointments.  Has received both lower limb protheses, was advised not to keep them on more than a few hours each day by the therapist. Past Medical History: Past Medical History:  Diagnosis Date   CHF (congestive heart failure) ([place])    Dehiscence of amputation stump ([place])    left great toe   Diabetes mellitus without complication ([place]) 8841   Type II   Glaucoma 2015   Hyperlipidemia    Hypertension 2005   Left foot infection 02/07/2021   Osteomyelitis ([place])    Wears glasses     Encounter Details:  CNP Questionnaire - 11/20/22 1200       Questionnaire   Ask client: Do you give verbal consent for me to treat you today? Yes    Student Assistance N/A    Location Patient [facility]    Visit Setting with Client Organization    Patient Status Unhoused    Insurance Medicaid    Insurance/Financial Assistance Referral N/A    Medication Have Medication Insecurities    Medical Provider Yes    Screening Referrals Made N/A    Medical Referrals Made N/A    Medical Appointment Made N/A    Recently w/o PCP, now 1st time PCP visit completed due to CNs referral or appointment made N/A    Food Have Food Insecurities    Transportation Need transportation assistance    Housing/Utilities No permanent housing    Interpersonal Safety Do not feel safe at current residence    Interventions Advocate/Support;Counsel    Abnormal to Normal Screening Since Last CN Visit N/A    Screenings CN Performed N/A    Sent Client to Lab for: N/A    Did client attend any of the following based off CNs referral or appointments made? N/A    ED Visit Averted N/A    Life-Saving Intervention Made N/A

## 2022-11-28 NOTE — Patient Outreach (Signed)
Medicaid Managed Care   Nurse Care Manager Note  11/28/2022 Name:  Scott Greer MRN:  [id number] DOB:  November 01, 1956  Scott Greer is an 67 y.o. year old male who is a primary patient of [name], MD.  The [facility] Managed Care Coordination team was consulted for assistance with:    DMII  Scott Greer was given information about Medicaid Managed Care Coordination team services today. [name] Patient agreed to services and verbal consent obtained.  Engaged with patient by telephone for follow up visit in response to provider referral for case management and/or care coordination services.   Assessments/Interventions:  Review of past medical history, allergies, medications, health status, including review of consultants reports, laboratory and other test data, was performed as part of comprehensive evaluation and provision of chronic care management services.  SDOH (Social Determinants of Health) assessments and interventions performed: SDOH Interventions    Flowsheet Row Patient Outreach Telephone from 11/28/2022 in [place] Patient Outreach Telephone from 11/20/2022 in [place] Patient Outreach Telephone from 06/26/2022 in [place] Coordination Patient Outreach Telephone from 04/16/2022 in [place] Patient Outreach Telephone from 03/06/2022 in [place] Patient Outreach Telephone from 02/27/2022 in [place] Coordination  SDOH Interventions        Food Insecurity Interventions -- Intervention Not Indicated -- -- -- --  Housing Interventions -- Other (Comment)  [Collaborated with Patent attorney, [facility] and BSW] Other (Comment)  [Patient is currently homeless. RNCM collaborated with [employer]. [name] will follow up this afternoon  after contacting [employer] [facility]. [name] with Social Services with APS is now involved] Intervention Not Indicated  [Patient working with [employer] and an agent [name] to find housing. Currently living in [place].] -- --  Transportation Interventions Intervention Not Indicated  [Patient using transportation provided by Medicaid] -- -- Other (Comment)  [Provided with [employer] transportation [phone] Other (Comment)  [Provided with [employer] [phone] --  Stress Interventions -- -- -- -- -- Other (Comment)  [Referral to [place]  Allergies  Allergen Reactions   Bee Venom Hives, Itching and Swelling    Medications Reviewed Today     Reviewed by [name], RN (Registered Nurse) on 11/28/22 at 1557  Med List Status: <None>   Medication Order Taking? Sig Documenting Provider Last Dose Status Informant  Accu-Chek Softclix Lancets lancets [id number] No Use to test blood sugars up to 4 times daily as directed.  Patient not taking: Reported on 10/25/2022   [name], MD Not Taking Active   amiodarone (PACERONE) 200 MG tablet [id number] No Take 1 tablet (200 mg total) by mouth daily. [name], PA-C Unknown Active   aspirin (ASPIRIN LOW DOSE) 81 MG chewable tablet [id number] No CHEW ONE TABLET BY MOUTH ONCE DAILY Needs appointment for further refills [name], PA-C Taking Active   atorvastatin (LIPITOR) 10 MG tablet [id number] No Take 1 tablet (10 mg total) by mouth every evening. [name], PA-C Taking Active   Blood Glucose Monitoring Suppl (ACCU-CHEK GUIDE) w/Device KIT [id number] No Use to test blood sugars up to 4 times daily as directed.  Patient not taking: Reported on 10/25/2022   [name], MD Not Taking Active   Blood Glucose Monitoring Suppl (TRUE METRIX METER) w/Device KIT [id number] No Use as directed  Patient not taking: Reported on 10/25/2022   [name], MD Not Taking  Active Other  carvedilol (COREG) 3.125 MG tablet [id number] No Take 1 tablet (3.125 mg total) by mouth 2 (two) times daily with a meal. [name], PA-C Taking Active   Continuous Blood Gluc Receiver (FREESTYLE LIBRE 2 READER) DEVI [id number] No UAD  Patient not taking: Reported on 10/25/2022   [name], MD Not Taking Active   Continuous Blood Gluc Sensor ([place]) [place] [id number] No Change sensor Q 2 wks  Patient not taking: Reported on 10/25/2022   [name], MD Not Taking Active   digoxin (LANOXIN) 0.125 MG tablet [id number] No TAKE ONE TABLET BY MOUTH EVERY EVENING WITH SUPPER [name], PA-C Taking Active   empagliflozin (JARDIANCE) 10 MG TABS tablet [id number] No TAKE ONE TABLET BY MOUTH AT [place], Angela M, [place] Taking Active   ferrous sulfate (FEROSUL) 325 (65 FE) MG tablet [id number] No TAKE ONE TABLET BY MOUTH EVERY MORNING WITH BREAKFAST [name], MD Taking Active   gabapentin (NEURONTIN) 300 MG capsule [id number] No Take 1 capsule (300 mg total) by mouth 2 (two) times daily. [name], PA-C Taking Active   glucose blood (ACCU-CHEK GUIDE) test strip [id number] No Use to test blood sugars up to 4 times daily as directed.  Patient not taking: Reported on 10/25/2022   [name], MD Not Taking Active   isosorbide-hydrALAZINE (BIDIL) 20-37.5 MG tablet [id number] No TAKE ONE TABLET BY MOUTH Two TIMES DAILY [name], PA-C Taking Active   metFORMIN (GLUCOPHAGE) 500 MG tablet [id number] No Take 1 tablet (500 mg total) by mouth daily with breakfast. [name], PA-C Taking Active   nutrition supplement, [name]) PACK [id number] No Take 1 packet by mouth 2 (two) times daily between meals.  Patient not taking: Reported on 10/25/2022   Persons, [name], [name] Not Taking Active   sacubitril-valsartan (ENTRESTO) 24-26 MG [id number] No Take 1 tablet by mouth 2 (two) times daily. Absolute last refill without  office visit please call [phone] to schedule [name], PA-C Unknown Active   spironolactone (ALDACTONE) 25 MG tablet [id number] No TAKE ONE TABLET BY MOUTH EVERY MORNING [name], PA-C Taking Active   torsemide (DEMADEX) 20 MG tablet [id number] No TAKE ONE TABLET BY MOUTH ONCE DAILY [name], PA-C Taking Active   zinc sulfate 220 (50 Zn) MG capsule [id number] No Take 1 capsule (220 mg total) by mouth daily.  Patient not taking: Reported on 10/25/2022   Persons, [name], [name] Not Taking Active             Patient Active Problem List   Diagnosis Date Noted   Chronic combined systolic and diastolic CHF (congestive heart failure) ([place]) 12/01/2021   S/P BKA (below knee amputation) bilateral ([place]) 07/18/2021   Gangrene of right foot ([place])    Foot pain, right 04/14/2021   Status post below-knee amputation ([place]) 02/17/2021   Wound dehiscence    Gangrene of toe of left foot (H[facility])    Cutaneous abscess of left foot    Left foot infection 02/07/2021   Leukocytosis 02/07/2021   Thrombocytosis 02/07/2021   Hyponatremia 02/07/2021   AKI (acute kidney injury) ([place]) 02/07/2021   Hyperglycemia due to diabetes mellitus ([place]) 02/07/2021   New onset of congestive heart failure ([place]) 12/26/2020   CKD (chronic kidney disease) stage 3, GFR 30-59 ml/min ([place]) 02/40/9735   Acute systolic CHF (  congestive heart failure) (H[facility]) 12/26/2020   Anemia, chronic disease 06/22/2020   Amputee, great toe, right (HCC) 06/20/2020   Normocytic anemia 06/20/2020   Erectile dysfunction associated with type 2 diabetes mellitus ([facility]) 06/20/2020   Positive for macroalbuminuria 10/24/2019   Amputation of left great toe ([facility]) 10/23/2019   Tobacco dependence 10/23/2019   Diabetic foot infection ([facility])    Noncompliance    Glaucoma    Hyperlipidemia    Essential hypertension 11/06/2003   Diabetes mellitus ([facility]) 11/05/1997    Conditions to be addressed/monitored per PCP order:  DMII  Care  Plan : RN Care Manager Plan of Care  Updates made by Heidi Dach, RN since 11/28/2022 12:00 AM     Problem: Health Management needs related to DMII      Long-Range Goal: Development of Plan of Care to address Health Management needs related to DMII   Start Date: 02/27/2022  Expected End Date: 02/01/2023  Priority: High  Note:   Current Barriers:  Care Coordination needs related to Housing barriers  Chronic Disease Management support and education needs related to DMII, Homelessness, and Stress  Scott Greer continues to stay at [employer]. He has both prosthetic legs and is attending PT to learn to walk. His medications are delivered to [facility]. He continues to desire housing. He did not receive communication with Shanda Bumps or [name] for housing resources. Today he is at his cousins apartment visiting.  RNCM Clinical Goal(s):  Patient will verbalize understanding of plan for management of DMII as evidenced by patient report continue to work with RN Care Manager and/or Social Worker to address care management and care coordination needs related to Homelessness and managing stress as evidenced by adherence to CM Team Scheduled appointments     through collaboration with RN Care manager, provider, and care team.   Interventions: Inter-disciplinary care team collaboration (see longitudinal plan of care) Evaluation of current treatment plan related to  self management and patient's adherence to plan as established by provider Advised patient to answer his phone Rescheduled missed telephone visit with BSW, scheduled on 11/29/22 @ 12:30pm Provided therapeutic listening Reviewed PT note and electric wheelchair fitting note   Diabetes:  (Status: Goal on Track (progressing): YES.) Long Term Goal   Lab Results  Component Value Date   HGBA1C 6.9 08/16/2022   @ Assessed patient's understanding of A1c goal: <7% Discussed plans with patient for ongoing care management follow up and  provided patient with direct contact information for care management team;      Referral made to social work team for assistance with managing stress and procuring housing ;      Assessed social determinant of health barriers;        Unable to review medications, Patient is taking prepackaged medications Reviewed upcoming appointment including: PT on Monday and Friday, Eye Exam on 12/10/22 and 12/21/22 wit PCP  Patient Goals/Self-Care Activities: Attend all scheduled provider appointments Call provider office for new concerns or questions  Work with the social worker to address care coordination needs and will continue to work with the clinical team to address health care and disease management related needs Follow up with housing resources       Follow Up:  Patient agrees to Care Plan and Follow-up.  Plan: The Managed Medicaid care management team will reach out to the patient again over the next 30 days.  Date/time of next scheduled RN care management/care coordination outreach:  12/31/22 @ 2:30pm  Estanislado Emms RN,  [facility] RN Care Coordinator

## 2022-11-29 ENCOUNTER — Other Ambulatory Visit: Payer: Medicaid Other

## 2022-11-29 NOTE — Patient Outreach (Signed)
  Medicaid Managed Care   Unsuccessful Outreach Note  11/29/2022 Name: Scott Greer MRN: [id number] DOB: 05/30/1956  Referred by: Johnson, Deborah B, MD Reason for referral : High Risk Managed Medicaid (MM social work unsuccessful telephone outreach )   A second unsuccessful telephone outreach was attempted today. The patient was referred to the case management team for assistance with care management and care coordination.   Follow Up Plan: A HIPAA compliant phone message was left for the patient providing contact information and requesting a return call.   Chandel Zaun, BSW, MHA [facility]  [place]  High Risk Managed Medicaid Team  [phone]  

## 2022-11-29 NOTE — Patient Instructions (Signed)
  Medicaid Managed Care   Unsuccessful Outreach Note  11/29/2022 Name: Scott Greer MRN: [id number] DOB: [birth year]  Referred by: [name], MD Reason for referral : High Risk Managed Medicaid (MM social work unsuccessful telephone outreach )   A second unsuccessful telephone outreach was attempted today. The patient was referred to the case management team for assistance with care management and care coordination.   Follow Up Plan: A HIPAA compliant phone message was left for the patient providing contact information and requesting a return call.   Mickel Fuchs, BSW, [place] Managed Medicaid Team  [phone]

## 2022-11-29 NOTE — Congregational Nurse Program (Signed)
  Dept: [phone]   Congregational Nurse Program Note  Date of Encounter: 11/29/2022  Clinic visit to check blood pressure and blood glucose. BP 122/72, blood glucose not checked due to time he had eaten lunch. Past Medical History: Past Medical History:  Diagnosis Date   CHF (congestive heart failure) ([place])    Dehiscence of amputation stump ([place])    left great toe   Diabetes mellitus without complication ([place]) 2355   Type II   Glaucoma 2015   Hyperlipidemia    Hypertension 2005   Left foot infection 02/07/2021   Osteomyelitis ([place])    Wears glasses     Encounter Details:  CNP Questionnaire - 11/29/22 1200       Questionnaire   Ask client: Do you give verbal consent for me to treat you today? Yes    Student Assistance [employer] Nurse    Location Patient [facility]    Visit Setting with Client Organization    Patient Status Unhoused    Insurance Medicaid    Insurance/Financial Assistance Referral N/A    Medication Have Medication Insecurities    Medical Provider Yes    Screening Referrals Made N/A    Medical Referrals Made N/A    Medical Appointment Made N/A    Recently w/o PCP, now 1st time PCP visit completed due to CNs referral or appointment made N/A    Food Have Food Insecurities    Transportation Need transportation assistance    Housing/Utilities No permanent housing    Interpersonal Safety Do not feel safe at current residence    Interventions Advocate/Support;Counsel;Case Management;Reviewed Medications;Educate    Abnormal to Normal Screening Since Last CN Visit N/A    Screenings CN Performed Blood Pressure;Pulse Ox;Blood Glucose    Sent Client to Lab for: N/A    Did client attend any of the following based off CNs referral or appointments made? N/A    ED Visit Averted N/A    Life-Saving Intervention Made N/A

## 2022-11-30 ENCOUNTER — Ambulatory Visit: Payer: Medicaid Other | Admitting: Physical Therapy

## 2022-11-30 ENCOUNTER — Encounter: Payer: Self-pay | Admitting: Physical Therapy

## 2022-11-30 VITALS — BP 169/60 | HR 76

## 2022-11-30 DIAGNOSIS — R2681 Unsteadiness on feet: Secondary | ICD-10-CM

## 2022-11-30 DIAGNOSIS — R293 Abnormal posture: Secondary | ICD-10-CM

## 2022-11-30 DIAGNOSIS — M6281 Muscle weakness (generalized): Secondary | ICD-10-CM | POA: Diagnosis not present

## 2022-11-30 DIAGNOSIS — R2689 Other abnormalities of gait and mobility: Secondary | ICD-10-CM | POA: Diagnosis not present

## 2022-11-30 DIAGNOSIS — Z89512 Acquired absence of left leg below knee: Secondary | ICD-10-CM | POA: Diagnosis not present

## 2022-11-30 DIAGNOSIS — Z89511 Acquired absence of right leg below knee: Secondary | ICD-10-CM | POA: Diagnosis not present

## 2022-11-30 NOTE — Therapy (Signed)
OUTPATIENT PHYSICAL THERAPY PROSTHETICS TREATMENT   Patient Name: Scott Greer MRN: [id number] DOB:Jun 02, 1956, 67 y.o., male Today's Date: 11/30/2022  PCP: Jonah Blue, MD REFERRING PROVIDER: Jonah Blue, MD   END OF SESSION:  PT End of Session - 11/30/22 0957     Visit Number 3    Number of Visits 17    Date for PT Re-Evaluation 01/13/23    Authorization Type [employer] (27 v limit)    Authorization - Number of Visits 24    PT Start Time 0950   Pt agreeable to being seen early   PT Stop Time 1048    PT Time Calculation (min) 58 min    Equipment Utilized During Treatment Gait belt    Activity Tolerance Patient tolerated treatment well    Behavior During Therapy WFL for tasks assessed/performed              Past Medical History:  Diagnosis Date   CHF (congestive heart failure) (HCC)    Dehiscence of amputation stump (HCC)    left great toe   Diabetes mellitus without complication (HCC) 1999   Type II   Glaucoma 2015   Hyperlipidemia    Hypertension 2005   Left foot infection 02/07/2021   Osteomyelitis (HCC)    Wears glasses    Past Surgical History:  Procedure Laterality Date   AMPUTATION Left 06/05/2019   Procedure: LEFT GREAT TOE AMPUTATION;  Surgeon: [name], MD;  Location: [facility] OR;  Service: Orthopedics;  Laterality: Left;   AMPUTATION Left 07/10/2019   Procedure: LEFT FOOT 1ST RAY AMPUTATION;  Surgeon: [name], MD;  Location: [facility] OR;  Service: Orthopedics;  Laterality: Left;   AMPUTATION Right 05/25/2020   Procedure: RIGHT GREAT TOE AMPUTATION;  Surgeon: [name], MD;  Location: [facility] OR;  Service: Orthopedics;  Laterality: Right;   AMPUTATION Left 01/25/2021   Procedure: LEFT 2ND TOE AMPUTATION;  Surgeon: [name], MD;  Location: [facility] OR;  Service: Orthopedics;  Laterality: Left;   AMPUTATION Left 02/08/2021   Procedure: LEFT TRANSMETATARSAL AMPUTATION;  Surgeon: [name], MD;  Location: [facility] OR;  Service: Orthopedics;   Laterality: Left;   AMPUTATION Left 02/10/2021   Procedure: AMPUTATION BELOW KNEE;  Surgeon: [name], MD;  Location: [facility] OR;  Service: Orthopedics;  Laterality: Left;   AMPUTATION Right 04/14/2021   Procedure: RIGHT 1ST AND 2ND RAY AMPUTATION;  Surgeon: [name], MD;  Location: [facility] OR;  Service: Orthopedics;  Laterality: Right;   AMPUTATION Right 04/26/2021   Procedure: RIGHT BELOW KNEE AMPUTATION;  Surgeon: [name], MD;  Location: [facility] OR;  Service: Orthopedics;  Laterality: Right;   NO PAST SURGERIES     RIGHT/LEFT HEART CATH AND CORONARY ANGIOGRAPHY N/A 01/02/2021   Procedure: RIGHT/LEFT HEART CATH AND CORONARY ANGIOGRAPHY;  Surgeon: Runell Gess, MD;  Location: [facility] INVASIVE CV LAB;  Service: Cardiovascular;  Laterality: N/A;   Patient Active Problem List   Diagnosis Date Noted   Chronic combined systolic and diastolic CHF (congestive heart failure) (HCC) 12/01/2021   S/P BKA (below knee amputation) bilateral (HCC) 07/18/2021   Gangrene of right foot (HCC)    Foot pain, right 04/14/2021   Status post below-knee amputation (HCC) 02/17/2021   Wound dehiscence    Gangrene of toe of left foot (HCC)    Cutaneous abscess of left foot    Left foot infection 02/07/2021   Leukocytosis 02/07/2021   Thrombocytosis 02/07/2021   Hyponatremia 02/07/2021  AKI (acute kidney injury) (HCC) 02/07/2021   Hyperglycemia due to diabetes mellitus (HCC) 02/07/2021   New onset of congestive heart failure (HCC) 12/26/2020   CKD (chronic kidney disease) stage 3, GFR 30-59 ml/min (HCC) 12/26/2020   Acute systolic CHF (congestive heart failure) (HCC) 12/26/2020   Anemia, chronic disease 06/22/2020   Amputee, great toe, right (HCC) 06/20/2020   Normocytic anemia 06/20/2020   Erectile dysfunction associated with type 2 diabetes mellitus (HCC) 06/20/2020   Positive for macroalbuminuria 10/24/2019   Amputation of left great toe (HCC) 10/23/2019   Tobacco dependence 10/23/2019   Diabetic foot  infection (HCC)    Noncompliance    Glaucoma    Hyperlipidemia    Essential hypertension 11/06/2003   Diabetes mellitus (HCC) 11/05/1997    ONSET DATE: 11/02/2022 (referral date) (He reports he got legs on 12/26)  REFERRING DIAG: Z89.512,Z89.511 (ICD-10-CM) - S/P BKA (below knee amputation) bilateral (HCC)  THERAPY DIAG:  Other abnormalities of gait and mobility  Unsteadiness on feet  Muscle weakness (generalized)  Abnormal posture  Rationale for Evaluation and Treatment: Rehabilitation  SUBJECTIVE:   SUBJECTIVE STATEMENT: Pt reports he did not take his meds this morning which might be why his BP is high today.  He just got measured for a motorized wheelchair at [employer] yesterday, but did not hear about his RW.   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  left BKA on 02/10/21 and R BKA on 04/26/21.   PAIN:  Are you having pain? No  VITALS  Vitals:   11/30/22 0955  BP: (!) 169/60  Pulse: 76     PRECAUTIONS: Fall  WEIGHT BEARING RESTRICTIONS: No  FALLS: Has patient fallen in last 6 months? Yes. Number of falls a few   LIVING ENVIRONMENT: Lives with: lives with their family and is homeless is living at [facility]  Lives in: Homeless shelter Home Access: Elevator Home layout: Two level Stairs: No Has following equipment at home: Wheelchair (manual)  PLOF: Independent with basic ADLs, Independent with community mobility with device, and Independent with homemaking with wheelchair  PATIENT GOALS: "I want to be able to get my own apartment.  I want to walk independently with these legs."  OBJECTIVE:   DIAGNOSTIC FINDINGS: n/a  COGNITION: Overall cognitive status: Within functional limits for tasks assessed   SENSATION: WFL  MUSCLE LENGTH: Some tightness noted in B knees in standing, unable to achieve full knee  extension, lacking approx 10 deg in standing.    POSTURE: rounded shoulders, forward head, and flexed trunk    LOWER EXTREMITY MMT:  MMT Right eval Left eval  Hip flexion 4/5 4/5  Hip extension    Hip abduction 4/5 4/5  Hip adduction 4/5 4/5  Hip internal rotation    Hip external rotation    Knee flexion 4/5 4/5  Knee extension 4/5 4/5  Ankle dorsiflexion    Ankle plantarflexion    Ankle inversion    Ankle eversion    (Blank rows = not tested) All tested in seated position.   TODAY'S TREATMENT:   Gait Training  THERACT: Lezlie Lye PT Assessment - 11/30/22 1014       Standardized Balance Assessment   Standardized Balance Assessment Berg Balance Test      Berg Balance Test   Sit to Stand Able to stand  independently using hands  Standing Unsupported Able to stand 2 minutes with supervision    Sitting with Back Unsupported but Feet Supported on Floor or Stool Able to sit safely and securely 2 minutes    Stand to Sit Controls descent by using hands    Transfers Able to transfer with verbal cueing and /or supervision    Standing Unsupported with Eyes Closed Able to stand 3 seconds    Standing Unsupported with Feet Together Needs help to attain position but able to stand for 30 seconds with feet together   FT limited by sockets   From Standing, Reach Forward with Outstretched Arm Reaches forward but needs supervision    From Standing Position, Pick up Object from Floor Unable to pick up and needs supervision    From Standing Position, Turn to Look Behind Over each Shoulder Turn sideways only but maintains balance    Turn 360 Degrees Needs assistance while turning    Standing Unsupported, Alternately Place Feet on Step/Stool Able to complete >2 steps/needs minimal assist    Standing Unsupported, One Foot in [employer] balance while stepping or standing    Standing on One Leg Tries to lift leg/unable to hold 3 seconds but remains standing independently    Total Score  24    Berg comment: 24/56 = significant fall risk            GAIT: Gait pattern: step through pattern, decreased stride length, knee flexed in stance- Right, knee flexed in stance- Left, trendelenburg, trunk flexed, and poor foot clearance- Right Distance walked: 400'  Assistive device utilized: Environmental consultant - 2 wheeled Level of assistance: SBA and CGA Comments: Pt able to ambulate 400' without rest breaks and improved UE reliance on RW this visit. Continues to exhibit decreased knee extension left worse than right, mild right trendelenburg, and intermittent right toe drag with cues for clearance and good correction towards end of trial.  **Of note, pt seated on towel soaked in urine today and states he is unable to get WC into bathroom at shelter [facility] and is limited in his time upstairs in accessible personal area to change himself or clean up.  Because of this, he urinates on himself and tries to change depends/briefs at night.  PT prior educated pt on skin breakdown/infection risk with this, he verbalizes ongoing understanding of concern, and PT today continued to encourage pt to ask for portable urinal to use until he obtains RW and can get to bathroom.  PT this session (1/26) provides clean towel (disposed of old towel per pt request), chuck pad, and additional brief due to soiled nature and pt toilets prior to PT session in clinic.  Pt is currently staying at the [facility].  He is working with a case worker to find him an apartment.  Therapeutic listening provided as pt becomes somewhat emotional about PMH leading to bilateral amputations and current housing situation.  He is discouraged by the process of it all, but motivated to be in PT.  He does not overtly fear for his safety, but is worried eventually he will end up in boarding style housing with little privacy and is bothered by ongoing issues others around his exhibit in current shelter housing.  Pt does not indicate need for in-depth  screening at this time.  -SciFit x16min BUE/BLE for cardiovascular endurance and LE strengthening on level 4.0  CURRENT PROSTHETIC WEAR ASSESSMENT: Patient is independent with: skin check and residual limb care Patient is dependent with: prosthetic cleaning, ply sock  cleaning, correct ply sock adjustment, proper wear schedule/adjustment, and proper weight-bearing schedule/adjustment Donning prosthesis: Complete Independence Doffing prosthesis: Complete Independence  Prosthetic wear tolerance: Reports he is wearing for about 2 hours a day or more depending on when he can go upstairs in the shelter after dinner. Prosthetic weight bearing tolerance: 2-3 minutes Edema: none noted  Residual limb condition: Pts skin is in excellent condition, good hydration and hair growth.  Prosthetic description: pin lock suspension with flexible inner socket.  He does 1 ply over this and then 5 ply over silicone liner.   K code/activity level with prosthetic use: Level 3     PATIENT EDUCATED ON FOLLOWING PROSTHETIC CARE: Prosthetic wear tolerance: 3 hours, 2x/day, 7 days/week Prosthetic weight bearing tolerance: 3-5 minutes Other education  Correct ply sock adjustment, Propper donning, Proper doffing, Proper wear schedule/adjustment, and Proper weight-bearing schedule/adjustment  PATIENT EDUCATION: Education details: Call [employer] and follow-up about RW - confirmed pt has a phone and a contact card for Josh at [employer]. Person educated: Patient Education method: Explanation, Demonstration, and Verbal cues Education comprehension: verbalized understanding and needs further education  HOME EXERCISE PROGRAM: Amputee sink HEP Do each exercise 1-2  times per day Do each exercise 5-10 repetitions Hold each exercise for 2 seconds to feel your location [place] out item 3 due to limited availability in shelter.  AT [facility] FIND YOUR MIDLINE POSITION AND PLACE FEET EQUAL DISTANCE FROM THE MIDLINE.  Try to find this  position when standing still for activities.   USE TAPE ON FLOOR TO MARK THE MIDLINE POSITION which is even with middle of sink.  You also should try to feel with your limb pressure in socket.  You are trying to feel with limb what you used to feel with the bottom of your foot.  Side to Side Shift: Moving your hips only (not shoulders): move weight onto your left leg, HOLD/FEEL pressure in socket.  Move back to equal weight on each leg, HOLD/FEEL pressure in socket. Move weight onto your right leg, HOLD/FEEL pressure in socket. Move back to equal weight on each leg, HOLD/FEEL pressure in socket. Repeat.  Start with both hands on sink, progress to hand on prosthetic side only, then no hands.  Front to Back Shift: Moving your hips only (not shoulders): move your weight forward onto your toes, HOLD/FEEL pressure in socket. Move your weight back to equal Flat Foot on both legs, HOLD/FEEL  pressure in socket. Move your weight back onto your heels, HOLD/FEEL  pressure in socket. Move your weight back to equal on both legs, HOLD/FEEL  pressure in socket. Repeat.  Start with both hands on sink, progress to hand on prosthetic side only, then no hands.  Overhead/Upward Reaching: alternated reaching up to top cabinets or ceiling if no cabinets present. Keep equal weight on each leg. Start with one hand support on counter while other hand reaches and progress to no hand support with reaching.  ace one hand in middle of sink and reach with other hand. Do both arms.  Then hover one hand and move cups with other hand.  5.   Looking Over Shoulders: With equal weight on each leg: alternate turning to look over your shoulders with one hand support on counter as needed.  Start with head motions only to look in front of shoulder, then even with shoulder and progress to looking behind you. To look to side, move head /eyes, then shoulder on side looking pulls back, shift more weight to side  looking and pull hip back. Place one  hand in middle of sink and let go with other hand so your shoulder can pull back. Switch hands to look other way.   Then hover one hand and look over shoulder. If looking right, use left hand at sink. If looking left, use right hand at sink.  ASSESSMENT:  CLINICAL IMPRESSION: Session limited by pt perseverating on past medical encounters leading to present requiring therapeutic listening, encouragement, and deferral to additional resources.  Pt able to tolerate unsupported standing with performance of BERG scoring 24/56 this visit.  He ambulates farther in clinic today with similar deficits noted last session.  He continues to benefit from skilled PT to progress dynamic stability and safety in order to promote quality of life and improved ADL management.  OBJECTIVE IMPAIRMENTS: Abnormal gait, decreased activity tolerance, decreased balance, decreased knowledge of use of DME, decreased mobility, decreased ROM, decreased strength, impaired perceived functional ability, impaired flexibility, impaired UE functional use, postural dysfunction, and prosthetic dependency .   ACTIVITY LIMITATIONS: carrying, lifting, bending, standing, squatting, stairs, transfers, bathing, and locomotion level  PARTICIPATION LIMITATIONS: meal prep, cleaning, laundry, driving, shopping, and community activity  PERSONAL FACTORS: Past/current experiences and 3+ comorbidities: see above  are also affecting patient's functional outcome.   REHAB POTENTIAL: Good  CLINICAL DECISION MAKING: Evolving/moderate complexity  EVALUATION COMPLEXITY: Moderate   GOALS: Goals reviewed with patient? Yes  SHORT TERM GOALS: Target date: 12/15/22  Pt will be IND with initial HEP in order to indicate improved functional mobility and dec fall risk. Baseline: None given at this time Goal status: INITIAL  2.  Pt will improve gait speed to >/=1.8 ft/sec w/ RW in order to indicate dec fall risk.  Baseline: 1.13 ft/sec with RW Goal  status: INITIAL  3.  Pt will ambulate 150' with RW at S level in order to indicate more independent household ambulation.   Baseline: 39' with RW at min A  Goal status: INITIAL  4.  Pt will improve TUG to </=38 secs w/ RW at S level in order to indicate dec fall risk   Baseline: 43.69 secs with RW and min A Goal status: INITIAL  5.  Pt will report wearing B prostheses 60% of awake hours, donning and doffing independently without skin issues.   Baseline: only wearing 30 mins a few times a day, needs some assist to don appropriately  Goal status: INITIAL  6.  Will assess BERG balance test and improve score by 4 points from baseline in order to indicate dec fall risk.  Baseline: 24/56 (1/26)  Goal status: INITIAL  LONG TERM GOALS: Target date: 01/13/23  Pt will be IND with final HEP in order to indicate improved functional mobility and dec fall risk. Baseline: none initiated at this time  Goal status: INITIAL  2.  Pt will improve gait speed to >/=2.62 ft/sec w/ RW in order to indicate dec fall risk.  Baseline: 1.13 ft/sec with RW and min A  Goal status: INITIAL  3.  Pt will negotiate up/down 4 steps with B rail, up/down ramp and curb and ambulate x 500' outdoors over unlevel paved surfaces with RW at S level in order to indicate improved community mobility.   Baseline: Not assessed due to time constraint Goal status: INITIAL  4.  Pt will improve TUG to </=30 secs w/ LRAD at S level in order to indicate dec fall risk   Baseline: 43.69 secs with RW and min A  Goal status:  INITIAL  5.  Pt will improve BERG balance score by 8 points from baseline in order to indicate dec fall risk.  Baseline: 24/56 (1/26) Goal status: INITIAL  6.  Will initiate gait training with quad tip cane/forearm crutch when appropriate and update goal.   Baseline: not assessed at this time  Goal status: INITIAL   PLAN:  PT FREQUENCY: 2x/week  PT DURATION: 8 weeks  PLANNED INTERVENTIONS:  Therapeutic exercises, Therapeutic activity, Neuromuscular re-education, Balance training, Gait training, Patient/Family education, Self Care, Stair training, Vestibular training, Prosthetic training, DME instructions, and Manual therapy  PLAN FOR NEXT SESSION: Pt may be appropriate for AMPRO at STG deadline?, add to HEP for standing balance, hamstring stretching/hip flex stretch-add high level strength and balance to HEP (pt lives in shelter so space is limited), prosthetic education, gait with RW, obstacles, stairs/ramp/curb, endurance    Elease Etienne, PT, DPT [facility] [address] [place], [place]  [address] Phone:  [phone] Fax:  [phone] 11/30/22, 2:27 PM    Check all possible CPT codes: 97110- Therapeutic Exercise, [phone]- Neuro Re-education, [phone] - Gait Training, [phone] - Manual Therapy, 97530 - Therapeutic Activities, [phone] - [place], and [phone] - Prosthetic training    Check all conditions that are expected to impact treatment: Diabetes mellitus   If treatment provided at initial evaluation, no treatment charged due to lack of authorization.

## 2022-12-03 ENCOUNTER — Encounter: Payer: Self-pay | Admitting: Physical Therapy

## 2022-12-03 ENCOUNTER — Ambulatory Visit: Payer: Medicaid Other | Admitting: Physical Therapy

## 2022-12-03 VITALS — BP 124/61 | HR 72

## 2022-12-03 DIAGNOSIS — M6281 Muscle weakness (generalized): Secondary | ICD-10-CM | POA: Diagnosis not present

## 2022-12-03 DIAGNOSIS — R293 Abnormal posture: Secondary | ICD-10-CM | POA: Diagnosis not present

## 2022-12-03 DIAGNOSIS — R2681 Unsteadiness on feet: Secondary | ICD-10-CM

## 2022-12-03 DIAGNOSIS — R2689 Other abnormalities of gait and mobility: Secondary | ICD-10-CM

## 2022-12-03 DIAGNOSIS — Z89511 Acquired absence of right leg below knee: Secondary | ICD-10-CM | POA: Diagnosis not present

## 2022-12-03 DIAGNOSIS — Z89512 Acquired absence of left leg below knee: Secondary | ICD-10-CM | POA: Diagnosis not present

## 2022-12-03 NOTE — Therapy (Signed)
OUTPATIENT PHYSICAL THERAPY PROSTHETICS TREATMENT   Patient Name: Scott Greer MRN: [id number] DOB:06-16-1956, 67 y.o., male Today's Date: 12/03/2022  PCP: Jonah Blue, MD REFERRING PROVIDER: Jonah Blue, MD   END OF SESSION:  PT End of Session - 12/03/22 1155     Visit Number 4    Number of Visits 17    Date for PT Re-Evaluation 01/13/23    Authorization Type [employer] (27 v limit)    Authorization - Number of Visits 24    PT Start Time 1147    PT Stop Time 1230    PT Time Calculation (min) 43 min    Equipment Utilized During Treatment Gait belt    Activity Tolerance Patient tolerated treatment well    Behavior During Therapy WFL for tasks assessed/performed              Past Medical History:  Diagnosis Date   CHF (congestive heart failure) (HCC)    Dehiscence of amputation stump (HCC)    left great toe   Diabetes mellitus without complication (HCC) 1999   Type II   Glaucoma 2015   Hyperlipidemia    Hypertension 2005   Left foot infection 02/07/2021   Osteomyelitis (HCC)    Wears glasses    Past Surgical History:  Procedure Laterality Date   AMPUTATION Left 06/05/2019   Procedure: LEFT GREAT TOE AMPUTATION;  Surgeon: [name], MD;  Location: [facility] OR;  Service: Orthopedics;  Laterality: Left;   AMPUTATION Left 07/10/2019   Procedure: LEFT FOOT 1ST RAY AMPUTATION;  Surgeon: [name], MD;  Location: [facility] OR;  Service: Orthopedics;  Laterality: Left;   AMPUTATION Right 05/25/2020   Procedure: RIGHT GREAT TOE AMPUTATION;  Surgeon: [name], MD;  Location: [facility] OR;  Service: Orthopedics;  Laterality: Right;   AMPUTATION Left 01/25/2021   Procedure: LEFT 2ND TOE AMPUTATION;  Surgeon: [name], MD;  Location: [facility] OR;  Service: Orthopedics;  Laterality: Left;   AMPUTATION Left 02/08/2021   Procedure: LEFT TRANSMETATARSAL AMPUTATION;  Surgeon: [name], MD;  Location: [facility] OR;  Service: Orthopedics;  Laterality: Left;   AMPUTATION  Left 02/10/2021   Procedure: AMPUTATION BELOW KNEE;  Surgeon: [name], MD;  Location: [facility] OR;  Service: Orthopedics;  Laterality: Left;   AMPUTATION Right 04/14/2021   Procedure: RIGHT 1ST AND 2ND RAY AMPUTATION;  Surgeon: [name], MD;  Location: [facility] OR;  Service: Orthopedics;  Laterality: Right;   AMPUTATION Right 04/26/2021   Procedure: RIGHT BELOW KNEE AMPUTATION;  Surgeon: [name], MD;  Location: [facility] OR;  Service: Orthopedics;  Laterality: Right;   NO PAST SURGERIES     RIGHT/LEFT HEART CATH AND CORONARY ANGIOGRAPHY N/A 01/02/2021   Procedure: RIGHT/LEFT HEART CATH AND CORONARY ANGIOGRAPHY;  Surgeon: Runell Gess, MD;  Location: [facility] INVASIVE CV LAB;  Service: Cardiovascular;  Laterality: N/A;   Patient Active Problem List   Diagnosis Date Noted   Chronic combined systolic and diastolic CHF (congestive heart failure) (HCC) 12/01/2021   S/P BKA (below knee amputation) bilateral (HCC) 07/18/2021   Gangrene of right foot (HCC)    Foot pain, right 04/14/2021   Status post below-knee amputation (HCC) 02/17/2021   Wound dehiscence    Gangrene of toe of left foot (HCC)    Cutaneous abscess of left foot    Left foot infection 02/07/2021   Leukocytosis 02/07/2021   Thrombocytosis 02/07/2021   Hyponatremia 02/07/2021   AKI (acute kidney injury) (HCC)  02/07/2021   Hyperglycemia due to diabetes mellitus (HCC) 02/07/2021   New onset of congestive heart failure (HCC) 12/26/2020   CKD (chronic kidney disease) stage 3, GFR 30-59 ml/min (HCC) 12/26/2020   Acute systolic CHF (congestive heart failure) (HCC) 12/26/2020   Anemia, chronic disease 06/22/2020   Amputee, great toe, right (HCC) 06/20/2020   Normocytic anemia 06/20/2020   Erectile dysfunction associated with type 2 diabetes mellitus (HCC) 06/20/2020   Positive for macroalbuminuria 10/24/2019   Amputation of left great toe (HCC) 10/23/2019   Tobacco dependence 10/23/2019   Diabetic foot infection (HCC)    Noncompliance     Glaucoma    Hyperlipidemia    Essential hypertension 11/06/2003   Diabetes mellitus (HCC) 11/05/1997    ONSET DATE: 11/02/2022 (referral date) (He reports he got legs on 12/26)  REFERRING DIAG: Z89.512,Z89.511 (ICD-10-CM) - S/P BKA (below knee amputation) bilateral (HCC)  THERAPY DIAG:  Other abnormalities of gait and mobility  Unsteadiness on feet  Muscle weakness (generalized)  Abnormal posture  Rationale for Evaluation and Treatment: Rehabilitation  SUBJECTIVE:   SUBJECTIVE STATEMENT: Pt reports he has been having an insurance issue regarding payment for his RW and he has to speak to someone at [employer] again as it may have to do with the power [facility] consult.  Pt denies falls or acute changes.  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  left BKA on 02/10/21 and R BKA on 04/26/21.   PAIN:  Are you having pain? No  VITALS  Vitals:   12/03/22 1151  BP: 124/61  Pulse: 72     PRECAUTIONS: Fall  WEIGHT BEARING RESTRICTIONS: No  FALLS: Has patient fallen in last 6 months? Yes. Number of falls a few   LIVING ENVIRONMENT: Lives with: lives with their family and is homeless is living at [facility]  Lives in: Homeless shelter Home Access: Elevator Home layout: Two level Stairs: No Has following equipment at home: Wheelchair (manual)  PLOF: Independent with basic ADLs, Independent with community mobility with device, and Independent with homemaking with wheelchair  PATIENT GOALS: "I want to be able to get my own apartment.  I want to walk independently with these legs."  OBJECTIVE:   DIAGNOSTIC FINDINGS: n/a  COGNITION: Overall cognitive status: Within functional limits for tasks assessed   SENSATION: WFL  MUSCLE LENGTH: Some tightness noted in B knees in standing, unable to achieve full knee extension, lacking approx 10  deg in standing.  POSTURE: rounded shoulders, forward head, and flexed trunk    LOWER EXTREMITY MMT:  MMT Right eval Left eval  Hip flexion 4/5 4/5  Hip extension    Hip abduction 4/5 4/5  Hip adduction 4/5 4/5  Hip internal rotation    Hip external rotation    Knee flexion 4/5 4/5  Knee extension 4/5 4/5  Ankle dorsiflexion    Ankle plantarflexion    Ankle inversion    Ankle eversion    (Blank rows = not tested) All tested in seated position.   TODAY'S TREATMENT:  -Discussed ongoing issue obtaining RW and possible hold ups and line of contact.  Confirmed contact for [employer] and that PT information has been faxed.  THEREX: -SciFit x34min BUE/BLE in hill mode level 5.0 for HIIT style workout for cardiovascular endurance and efficiency of reciprocal mobility, RPE of 2-3/10 midway through activity. -Terminal knee extension x12  on each LE w/ therapist holding blue theraband and blocking sach foot to prevent patient from lifting LE and compensating w/ step or hip shifting  Gait Training  GAIT: Gait pattern: step through pattern, decreased stride length, knee flexed in stance- Right, knee flexed in stance- Left, trendelenburg, trunk flexed, narrow BOS, and poor foot clearance- Right Distance walked: 515'  Assistive device utilized: Environmental consultant - 2 wheeled Level of assistance: SBA and CGA Comments: Pt able to ambulate 515' without rest breaks. Continues to exhibit forward trunk lean and decreased knee extension left worse than right during stance phase of gait with cues for improved control and quad engagement.  Cued to improve BOS w/ visual cues and upright posture using mirror during gait.  Pt is dyspneic at end of task.  **Of note, pt seated on towel soaked in urine today PT today continued to encourage pt to ask for portable urinal to use until he obtains RW and can get to bathroom.  PT this session (1/29) provides clean towel (disposed of old towel per pt request) and chuck pad due to  soiled nature.    -PT provides therapeutic listening as pt becomes emotional about cosmetic appearance of prostheses and comments from other individuals.  He briefly recounts medical history again with mild success with redirection at end of session.  CURRENT PROSTHETIC WEAR ASSESSMENT: Patient is independent with: skin check and residual limb care Patient is dependent with: prosthetic cleaning, ply sock cleaning, correct ply sock adjustment, proper wear schedule/adjustment, and proper weight-bearing schedule/adjustment Donning prosthesis: Complete Independence Doffing prosthesis: Complete Independence  Prosthetic wear tolerance: Reports he is wearing for about 2 hours a day or more depending on when he can go upstairs in the shelter after dinner. Prosthetic weight bearing tolerance: 2-3 minutes Edema: none noted  Residual limb condition: Pts skin is in excellent condition, good hydration and hair growth.  Prosthetic description: pin lock suspension with flexible inner socket.  He does 1 ply over this and then 5 ply over silicone liner.   K code/activity level with prosthetic use: Level 3     PATIENT EDUCATED ON FOLLOWING PROSTHETIC CARE: Prosthetic wear tolerance: 3 hours, 2x/day, 7 days/week Prosthetic weight bearing tolerance: 3-5 minutes Other education  Correct ply sock adjustment, Propper donning, Proper doffing, Proper wear schedule/adjustment, and Proper weight-bearing schedule/adjustment  PATIENT EDUCATION: Education details: Discussed ongoing numbness in left hand and difficulty dressing-pt does not want to have OT referral until he sees what he uses for PT.  Call [employer] and follow-up about RW - confirmed pt has a phone and a contact card for Josh at [employer]. Person educated: Patient Education method: Explanation, Demonstration, and Verbal cues Education comprehension: verbalized understanding and needs further education  HOME EXERCISE PROGRAM: Amputee sink HEP Do each exercise  1-2  times per day Do each exercise 5-10 repetitions Hold each exercise for 2 seconds to feel your location [place] out item 3 due to limited availability in shelter.  AT [facility] FIND YOUR MIDLINE POSITION AND PLACE FEET EQUAL DISTANCE FROM THE MIDLINE.  Try to find this position when standing still for activities.   USE TAPE ON FLOOR TO MARK THE MIDLINE POSITION which is even with middle of sink.  You also should try to feel with your limb pressure in socket.  You are trying to feel with limb what you used to feel with the bottom of your foot.  Side to Side Shift: Moving your hips only (not shoulders): move weight onto your left leg,  HOLD/FEEL pressure in socket.  Move back to equal weight on each leg, HOLD/FEEL pressure in socket. Move weight onto your right leg, HOLD/FEEL pressure in socket. Move back to equal weight on each leg, HOLD/FEEL pressure in socket. Repeat.  Start with both hands on sink, progress to hand on prosthetic side only, then no hands.  Front to Back Shift: Moving your hips only (not shoulders): move your weight forward onto your toes, HOLD/FEEL pressure in socket. Move your weight back to equal Flat Foot on both legs, HOLD/FEEL  pressure in socket. Move your weight back onto your heels, HOLD/FEEL  pressure in socket. Move your weight back to equal on both legs, HOLD/FEEL  pressure in socket. Repeat.  Start with both hands on sink, progress to hand on prosthetic side only, then no hands.  Overhead/Upward Reaching: alternated reaching up to top cabinets or ceiling if no cabinets present. Keep equal weight on each leg. Start with one hand support on counter while other hand reaches and progress to no hand support with reaching.  ace one hand in middle of sink and reach with other hand. Do both arms.  Then hover one hand and move cups with other hand.  5.   Looking Over Shoulders: With equal weight on each leg: alternate turning to look over your shoulders with one hand support on counter  as needed.  Start with head motions only to look in front of shoulder, then even with shoulder and progress to looking behind you. To look to side, move head /eyes, then shoulder on side looking pulls back, shift more weight to side looking and pull hip back. Place one hand in middle of sink and let go with other hand so your shoulder can pull back. Switch hands to look other way.   Then hover one hand and look over shoulder. If looking right, use left hand at sink. If looking left, use right hand at sink.  ASSESSMENT:  CLINICAL IMPRESSION: Emphasis of skilled session this visit on endurance and functional quad strength with carryover to gait training.  He continues to have bilateral knee flexion in stance with left being worse than right.  He continues to benefit from skilled PT to improve upright mobility, flexibility, and bilateral NMR to improve access to shelter environment and self care.  OBJECTIVE IMPAIRMENTS: Abnormal gait, decreased activity tolerance, decreased balance, decreased knowledge of use of DME, decreased mobility, decreased ROM, decreased strength, impaired perceived functional ability, impaired flexibility, impaired UE functional use, postural dysfunction, and prosthetic dependency .   ACTIVITY LIMITATIONS: carrying, lifting, bending, standing, squatting, stairs, transfers, bathing, and locomotion level  PARTICIPATION LIMITATIONS: meal prep, cleaning, laundry, driving, shopping, and community activity  PERSONAL FACTORS: Past/current experiences and 3+ comorbidities: see above  are also affecting patient's functional outcome.   REHAB POTENTIAL: Good  CLINICAL DECISION MAKING: Evolving/moderate complexity  EVALUATION COMPLEXITY: Moderate   GOALS: Goals reviewed with patient? Yes  SHORT TERM GOALS: Target date: 12/15/22  Pt will be IND with initial HEP in order to indicate improved functional mobility and dec fall risk. Baseline: None given at this time Goal status:  INITIAL  2.  Pt will improve gait speed to >/=1.8 ft/sec w/ RW in order to indicate dec fall risk.  Baseline: 1.13 ft/sec with RW Goal status: INITIAL  3.  Pt will ambulate 150' with RW at S level in order to indicate more independent household ambulation.   Baseline: 66' with RW at min A  Goal status: INITIAL  4.  Pt will improve TUG to </=38 secs w/ RW at S level in order to indicate dec fall risk   Baseline: 43.69 secs with RW and min A Goal status: INITIAL  5.  Pt will report wearing B prostheses 60% of awake hours, donning and doffing independently without skin issues.   Baseline: only wearing 30 mins a few times a day, needs some assist to don appropriately  Goal status: INITIAL  6.  Will assess BERG balance test and improve score by 4 points from baseline in order to indicate dec fall risk.  Baseline: 24/56 (1/26)  Goal status: INITIAL  LONG TERM GOALS: Target date: 01/13/23  Pt will be IND with final HEP in order to indicate improved functional mobility and dec fall risk. Baseline: none initiated at this time  Goal status: INITIAL  2.  Pt will improve gait speed to >/=2.62 ft/sec w/ RW in order to indicate dec fall risk.  Baseline: 1.13 ft/sec with RW and min A  Goal status: INITIAL  3.  Pt will negotiate up/down 4 steps with B rail, up/down ramp and curb and ambulate x 500' outdoors over unlevel paved surfaces with RW at S level in order to indicate improved community mobility.   Baseline: Not assessed due to time constraint Goal status: INITIAL  4.  Pt will improve TUG to </=30 secs w/ LRAD at S level in order to indicate dec fall risk   Baseline: 43.69 secs with RW and min A  Goal status: INITIAL  5.  Pt will improve BERG balance score by 8 points from baseline in order to indicate dec fall risk.  Baseline: 24/56 (1/26) Goal status: INITIAL  6.  Will initiate gait training with quad tip cane/forearm crutch when appropriate and update goal.   Baseline: not  assessed at this time  Goal status: INITIAL   PLAN:  PT FREQUENCY: 2x/week  PT DURATION: 8 weeks  PLANNED INTERVENTIONS: Therapeutic exercises, Therapeutic activity, Neuromuscular re-education, Balance training, Gait training, Patient/Family education, Self Care, Stair training, Vestibular training, Prosthetic training, DME instructions, and Manual therapy  PLAN FOR NEXT SESSION: Make additions to HEP for static unsupported and dynamic balance and quad strength!  Pt may be appropriate for AMPRO at STG deadline?, add to HEP for standing balance, hamstring stretching/hip flex stretch-add high level strength and balance to HEP (pt lives in shelter so space is limited), prosthetic education, gait with RW, obstacles, stairs/ramp/curb, endurance    Elease Etienne, PT, [employer] [address] [place], [place]  [address] Phone:  [phone] Fax:  [phone] 12/03/22, 1:17 PM    Check all possible CPT codes: 97110- Therapeutic Exercise, [phone]- Neuro Re-education, [phone] - Gait Training, [phone] - Manual Therapy, 97530 - Therapeutic Activities, [phone] - [place], and [phone] - Prosthetic training    Check all conditions that are expected to impact treatment: Diabetes mellitus   If treatment provided at initial evaluation, no treatment charged due to lack of authorization.

## 2022-12-06 DIAGNOSIS — E1121 Type 2 diabetes mellitus with diabetic nephropathy: Secondary | ICD-10-CM

## 2022-12-07 ENCOUNTER — Ambulatory Visit: Payer: Medicaid Other | Attending: Orthopedic Surgery | Admitting: Physical Therapy

## 2022-12-07 DIAGNOSIS — R293 Abnormal posture: Secondary | ICD-10-CM | POA: Insufficient documentation

## 2022-12-07 DIAGNOSIS — R2689 Other abnormalities of gait and mobility: Secondary | ICD-10-CM | POA: Insufficient documentation

## 2022-12-07 DIAGNOSIS — M6281 Muscle weakness (generalized): Secondary | ICD-10-CM | POA: Insufficient documentation

## 2022-12-07 DIAGNOSIS — R2681 Unsteadiness on feet: Secondary | ICD-10-CM | POA: Insufficient documentation

## 2022-12-10 ENCOUNTER — Ambulatory Visit: Payer: Medicaid Other | Admitting: Physical Therapy

## 2022-12-11 NOTE — Congregational Nurse Program (Signed)
  Dept: [phone]   Congregational Nurse Program Note  Date of Encounter: 12/11/2022  Clinic visit to review progress with therapy for adjusting to bilateral prosthesis.  He has missed 2 physical therapy visits due to transportation late on 12/07/22 and on yesterday when therapist was ill.    Review of medications found he has not been taking Jardiance when returning to [employer] after lunch time.  Advised to take the medicine with dinner on days he is out of the facility at lunch time rather than miss dosage each time he has therapy visits. Past Medical History: Past Medical History:  Diagnosis Date   CHF (congestive heart failure) ([place])    Dehiscence of amputation stump ([place])    left great toe   Diabetes mellitus without complication ([place]) 7591   Type II   Glaucoma 2015   Hyperlipidemia    Hypertension 2005   Left foot infection 02/07/2021   Osteomyelitis ([place])    Wears glasses     Encounter Details:  CNP Questionnaire - 12/11/22 1200       Questionnaire   Ask client: Do you give verbal consent for me to treat you today? Yes    Student Assistance N/A    Location Patient [facility]    Visit Setting with Client Organization    Patient Status Unhoused    Insurance Medicaid    Insurance/Financial Assistance Referral N/A    Medication Have Medication Insecurities    Medical Provider Yes    Screening Referrals Made N/A    Medical Referrals Made N/A    Medical Appointment Made N/A    Recently w/o PCP, now 1st time PCP visit completed due to CNs referral or appointment made N/A    Food Have Food Insecurities    Transportation Need transportation assistance    Housing/Utilities No permanent housing    Interpersonal Safety Do not feel safe at current residence    Interventions Advocate/Support;Counsel;Educate;Case Management    Abnormal to Normal Screening Since Last CN Visit N/A    Screenings CN Performed N/A    Sent Client to Lab for: N/A    Did client attend  any of the following based off CNs referral or appointments made? N/A    ED Visit Averted N/A    Life-Saving Intervention Made N/A

## 2022-12-11 NOTE — Congregational Nurse Program (Signed)
  Dept: [phone]   Congregational Nurse Program Note  Date of Encounter: 12/06/2022  Clinic visit to check blood glucose before lunch.  Has taken medications as prescribed, glucose 180. Past Medical History: Past Medical History:  Diagnosis Date   CHF (congestive heart failure) ([place])    Dehiscence of amputation stump ([place])    left great toe   Diabetes mellitus without complication ([place]) 3154   Type II   Glaucoma 2015   Hyperlipidemia    Hypertension 2005   Left foot infection 02/07/2021   Osteomyelitis ([place])    Wears glasses     Encounter Details:  CNP Questionnaire - 12/06/22 1030       Questionnaire   Ask client: Do you give verbal consent for me to treat you today? Yes    Student Assistance [employer] Nurse    Location Patient [facility]    Visit Setting with Client Organization    Patient Status Unhoused    Insurance Medicaid    Insurance/Financial Assistance Referral N/A    Medication Have Medication Insecurities    Medical Provider Yes    Screening Referrals Made N/A    Medical Referrals Made N/A    Medical Appointment Made N/A    Recently w/o PCP, now 1st time PCP visit completed due to CNs referral or appointment made N/A    Food Have Food Insecurities    Transportation Need transportation assistance    Housing/Utilities No permanent housing    Interpersonal Safety Do not feel safe at current residence    Interventions Advocate/Support;Counsel;Educate    Abnormal to Normal Screening Since Last CN Visit N/A    Screenings CN Performed Blood Glucose    Sent Client to Lab for: N/A    Did client attend any of the following based off CNs referral or appointments made? N/A    ED Visit Averted N/A    Life-Saving Intervention Made N/A

## 2022-12-12 ENCOUNTER — Other Ambulatory Visit: Payer: Self-pay | Admitting: Physician Assistant

## 2022-12-12 ENCOUNTER — Encounter: Payer: Self-pay | Admitting: Physician Assistant

## 2022-12-12 ENCOUNTER — Other Ambulatory Visit: Payer: Self-pay | Admitting: Critical Care Medicine

## 2022-12-12 ENCOUNTER — Other Ambulatory Visit: Payer: Self-pay

## 2022-12-12 DIAGNOSIS — I5042 Chronic combined systolic (congestive) and diastolic (congestive) heart failure: Secondary | ICD-10-CM

## 2022-12-12 MED ORDER — ENTRESTO 24-26 MG PO TABS
1.0000 | ORAL_TABLET | Freq: Two times a day (BID) | ORAL | 6 refills | Status: DC
  Filled 2022-12-12: qty 60, 30d supply, fill #0

## 2022-12-12 NOTE — Progress Notes (Signed)
Pt seen by Dr Joya Gaskins.  Most of the hoops that are needed to get him a motorized wheelchair have been completed.   He says the PT people want an additional test with him wearing the legs, but this is not confirmed.   He mentions that he feels the circulation in his L hand is bad, but pulses, strength and sensation were checked and were equal. The problem has been going on for > 1 year.   He denies numbness in his feet, had an infection from a blister on his toe. The skin did not heal well and then it got worse.   Yesterday, he had not taken the Jardiance when seen by Ms Freda Munro yesterday.   He was told he had a copay on his next pill set refill would be $40, has never had one before. It turns out that it was for the [facility], which was returned since he did not have the copay.  The Delene Loll comes from a pt assistance program in a separate bottle. He has not taken it recently. It was said that the Delene Loll was sent to [facility] in December, but they were contacted and said they had never received it.   Pt said he does not want a pillbox filled. Continue w/ [facility]. Will reorder the [facility] and see if it can be filled.   Rosaria Ferries, PA-C 12/12/2022 2:12 PM

## 2022-12-12 NOTE — Progress Notes (Signed)
error 

## 2022-12-13 ENCOUNTER — Encounter: Payer: Self-pay | Admitting: Physical Therapy

## 2022-12-13 ENCOUNTER — Ambulatory Visit: Payer: Medicaid Other | Admitting: Physical Therapy

## 2022-12-13 VITALS — BP 146/76 | HR 69

## 2022-12-13 DIAGNOSIS — M6281 Muscle weakness (generalized): Secondary | ICD-10-CM | POA: Diagnosis present

## 2022-12-13 DIAGNOSIS — R293 Abnormal posture: Secondary | ICD-10-CM

## 2022-12-13 DIAGNOSIS — R2689 Other abnormalities of gait and mobility: Secondary | ICD-10-CM | POA: Diagnosis present

## 2022-12-13 DIAGNOSIS — R2681 Unsteadiness on feet: Secondary | ICD-10-CM

## 2022-12-13 NOTE — Therapy (Signed)
OUTPATIENT PHYSICAL THERAPY PROSTHETICS TREATMENT   Patient Name: Scott Greer MRN: [id number] DOB:Sep 12, 1956, 67 y.o., male Today's Date: 12/13/2022  PCP: Jonah Blue, MD REFERRING PROVIDER: Jonah Blue, MD   END OF SESSION:  PT End of Session - 12/13/22 1107     Visit Number 5    Number of Visits 17    Date for PT Re-Evaluation 01/13/23    Authorization Type [employer] (27 v limit)    Authorization - Number of Visits 24    PT Start Time 1103    PT Stop Time 1145    PT Time Calculation (min) 42 min    Equipment Utilized During Treatment Gait belt    Activity Tolerance Patient tolerated treatment well    Behavior During Therapy WFL for tasks assessed/performed              Past Medical History:  Diagnosis Date   CHF (congestive heart failure) (HCC)    Dehiscence of amputation stump (HCC)    left great toe   Diabetes mellitus without complication (HCC) 1999   Type II   Glaucoma 2015   Hyperlipidemia    Hypertension 2005   Left foot infection 02/07/2021   Osteomyelitis (HCC)    Wears glasses    Past Surgical History:  Procedure Laterality Date   AMPUTATION Left 06/05/2019   Procedure: LEFT GREAT TOE AMPUTATION;  Surgeon: [name], MD;  Location: [facility] OR;  Service: Orthopedics;  Laterality: Left;   AMPUTATION Left 07/10/2019   Procedure: LEFT FOOT 1ST RAY AMPUTATION;  Surgeon: [name], MD;  Location: [facility] OR;  Service: Orthopedics;  Laterality: Left;   AMPUTATION Right 05/25/2020   Procedure: RIGHT GREAT TOE AMPUTATION;  Surgeon: [name], MD;  Location: [facility] OR;  Service: Orthopedics;  Laterality: Right;   AMPUTATION Left 01/25/2021   Procedure: LEFT 2ND TOE AMPUTATION;  Surgeon: [name], MD;  Location: [facility] OR;  Service: Orthopedics;  Laterality: Left;   AMPUTATION Left 02/08/2021   Procedure: LEFT TRANSMETATARSAL AMPUTATION;  Surgeon: [name], MD;  Location: [facility] OR;  Service: Orthopedics;  Laterality: Left;   AMPUTATION  Left 02/10/2021   Procedure: AMPUTATION BELOW KNEE;  Surgeon: [name], MD;  Location: [facility] OR;  Service: Orthopedics;  Laterality: Left;   AMPUTATION Right 04/14/2021   Procedure: RIGHT 1ST AND 2ND RAY AMPUTATION;  Surgeon: [name], MD;  Location: [facility] OR;  Service: Orthopedics;  Laterality: Right;   AMPUTATION Right 04/26/2021   Procedure: RIGHT BELOW KNEE AMPUTATION;  Surgeon: [name], MD;  Location: [facility] OR;  Service: Orthopedics;  Laterality: Right;   NO PAST SURGERIES     RIGHT/LEFT HEART CATH AND CORONARY ANGIOGRAPHY N/A 01/02/2021   Procedure: RIGHT/LEFT HEART CATH AND CORONARY ANGIOGRAPHY;  Surgeon: Runell Gess, MD;  Location: [facility] INVASIVE CV LAB;  Service: Cardiovascular;  Laterality: N/A;   Patient Active Problem List   Diagnosis Date Noted   Chronic combined systolic and diastolic CHF (congestive heart failure) (HCC) 12/01/2021   S/P BKA (below knee amputation) bilateral (HCC) 07/18/2021   Gangrene of right foot (HCC)    Foot pain, right 04/14/2021   Status post below-knee amputation (HCC) 02/17/2021   Wound dehiscence    Gangrene of toe of left foot (HCC)    Cutaneous abscess of left foot    Left foot infection 02/07/2021   Leukocytosis 02/07/2021   Thrombocytosis 02/07/2021   Hyponatremia 02/07/2021   AKI (acute kidney injury) (HCC)  02/07/2021   Hyperglycemia due to diabetes mellitus ([place]) 02/07/2021   New onset of congestive heart failure ([place]) 12/26/2020   CKD (chronic kidney disease) stage 3, GFR 30-59 ml/min (HCC) 70/96/2836   Acute systolic CHF (congestive heart failure) ([place]) 12/26/2020   Anemia, chronic disease 06/22/2020   Amputee, great toe, right ([place]) 06/20/2020   Normocytic anemia 06/20/2020   Erectile dysfunction associated with type 2 diabetes mellitus ([place]) 06/20/2020   Positive for macroalbuminuria 10/24/2019   Amputation of left great toe ([place]) 10/23/2019   Tobacco dependence 10/23/2019   Diabetic foot infection ([place])    Noncompliance     Glaucoma    Hyperlipidemia    Essential hypertension 11/06/2003   Diabetes mellitus ([place]) 11/05/1997    ONSET DATE: 11/02/2022 (referral date) (He reports he got legs on 12/26)  REFERRING DIAG: Z89.512,Z89.511 (ICD-10-CM) - S/P BKA (below knee amputation) bilateral (HCC)  THERAPY DIAG:  Other abnormalities of gait and mobility  Unsteadiness on feet  Muscle weakness (generalized)  Abnormal posture  Rationale for Evaluation and Treatment: Rehabilitation  SUBJECTIVE:   SUBJECTIVE STATEMENT: Pt reports his doctor ordered him a hand held urinal and should be on the way. He denies falls or near falls; his doctor also lowered metformin dose as indicated in chart due to kidney challenges.   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  left BKA on 02/10/21 and R BKA on 04/26/21.   PAIN:  Are you having pain? No  VITALS  Vitals:   12/13/22 1107  BP: (!) 146/76  Pulse: 69     PRECAUTIONS: Fall  WEIGHT BEARING RESTRICTIONS: No  FALLS: Has patient fallen in last 6 months? Yes. Number of falls a few   LIVING ENVIRONMENT: Lives with: lives with their family and is homeless is living at [facility] in: [place]: [place] layout: Two level Stairs: No Has following equipment at home: Wheelchair (manual)  PLOF: Independent with basic ADLs, Independent with community mobility with device, and Independent with homemaking with wheelchair  PATIENT GOALS: "I want to be able to get my own apartment.  I want to walk independently with these legs."  OBJECTIVE:   DIAGNOSTIC FINDINGS: n/a  COGNITION: Overall cognitive status: Within functional limits for tasks assessed   SENSATION: WFL  MUSCLE LENGTH: Some tightness noted in B knees in standing, unable to achieve full knee extension, lacking approx 10 deg in  standing.  POSTURE: rounded shoulders, forward head, and flexed trunk    LOWER EXTREMITY MMT:  MMT Right eval Left eval  Hip flexion 4/5 4/5  Hip extension    Hip abduction 4/5 4/5  Hip adduction 4/5 4/5  Hip internal rotation    Hip external rotation    Knee flexion 4/5 4/5  Knee extension 4/5 4/5  Ankle dorsiflexion    Ankle plantarflexion    Ankle inversion    Ankle eversion    (Blank rows = not tested) All tested in seated position.   TODAY'S TREATMENT:  -Discussed ongoing issue obtaining RW and possible hold ups and line of contact.  Confirm  TherAct - Reviewed STG as indicated below:  Ambulation: Gait pattern: step through pattern, decreased stride length, knee flexed in stance- Right, knee flexed in stance- Left, trendelenburg, trunk flexed, narrow BOS, and poor foot clearance- Right Distance walked: 200'  Assistive device utilized: Environmental consultant -  2 wheeled Level of assistance: SBA and CGA Comments: Pt able to ambulate without rest breaks. Continues to exhibit forward trunk lean and decreased knee extension left worse than right during stance phase of gait with cues for improved control and quad engagement. Pt has one instance where feet cross due to poor awareness of prosthetics and requires CGA for safety. Able to progress safely forward for remainder of distance.    [facility] PT Assessment - 12/13/22 0001       Standardized Balance Assessment   Standardized Balance Assessment Timed Up and Go Test;10 meter walk test    10 Meter Walk 1.97 ft/second   2WW + SBA     Timed Up and Go Test   Normal TUG (seconds) 30.34   2WW + SBA            NMR:  - EO balance without UE support 1 x 60"  - EC balance without UE support 1 x 30"  - Semitandem balance without UE support WBOS 2 x 12-30" bil  - Semitandem balance without UE support NBOS 2 x 10-15" bil  -PT arrives to clinic again in wet pants that are nearly dry due to incontinence. Patient to receive urinal shortly from  doctor. Patient states that he will change/shower when he gets home. Extra sanitation measures taken during session as indicated.  CURRENT PROSTHETIC WEAR ASSESSMENT: Patient is independent with: skin check and residual limb care Patient is dependent with: prosthetic cleaning, ply sock cleaning, correct ply sock adjustment, proper wear schedule/adjustment, and proper weight-bearing schedule/adjustment Donning prosthesis: Complete Independence Doffing prosthesis: Complete Independence  Prosthetic wear tolerance: Reports he is wearing for about 2 hours a day or more depending on when he can go upstairs in the shelter after dinner. Prosthetic weight bearing tolerance: 2-3 minutes Edema: none noted  Residual limb condition: Pts skin is in excellent condition, good hydration and hair growth.  Prosthetic description: pin lock suspension with flexible inner socket.  He does 1 ply over this and then 5 ply over silicone liner.   K code/activity level with prosthetic use: Level 3  PATIENT EDUCATED ON FOLLOWING PROSTHETIC CARE: Prosthetic wear tolerance: 3 hours, 2x/day, 7 days/week Prosthetic weight bearing tolerance: 3-5 minutes Other education  Correct ply sock adjustment, [place] donning, Proper doffing, Proper wear schedule/adjustment, and Proper weight-bearing schedule/adjustment  PATIENT EDUCATION: Education details: Continue HEP, progress towards goals/outcomes Person educated: Patient Education method: Consulting civil engineer, Demonstration, and Verbal cues Education comprehension: verbalized understanding and needs further education  HOME EXERCISE PROGRAM: Amputee sink HEP Do each exercise 1-2  times per day Do each exercise 5-10 repetitions Hold each exercise for 2 seconds to feel your location Struck out item 3 due to limited availability in shelter.  AT [place] AND PLACE FEET EQUAL DISTANCE FROM THE MIDLINE.  Try to find this position when standing still for activities.    USE TAPE ON FLOOR TO MARK THE MIDLINE POSITION which is even with middle of sink.  You also should try to feel with your limb pressure in socket.  You are trying to feel with limb what you used to feel with the bottom of your foot.  Side to Side Shift: Moving your hips only (not shoulders): move weight onto your left leg, HOLD/FEEL pressure in socket.  Move back to equal weight on each leg, HOLD/FEEL pressure in socket. Move weight onto your right leg, HOLD/FEEL pressure in socket. Move back to equal weight on each leg, HOLD/FEEL pressure in  socket. Repeat.  Start with both hands on sink, progress to hand on prosthetic side only, then no hands.  Front to Back Shift: Moving your hips only (not shoulders): move your weight forward onto your toes, HOLD/FEEL pressure in socket. Move your weight back to equal Flat Foot on both legs, HOLD/FEEL  pressure in socket. Move your weight back onto your heels, HOLD/FEEL  pressure in socket. Move your weight back to equal on both legs, HOLD/FEEL  pressure in socket. Repeat.  Start with both hands on sink, progress to hand on prosthetic side only, then no hands.  Overhead/Upward Reaching: alternated reaching up to top cabinets or ceiling if no cabinets present. Keep equal weight on each leg. Start with one hand support on counter while other hand reaches and progress to no hand support with reaching.  ace one hand in middle of sink and reach with other hand. Do both arms.  Then hover one hand and move cups with other hand.  5.   Looking Over Shoulders: With equal weight on each leg: alternate turning to look over your shoulders with one hand support on counter as needed.  Start with head motions only to look in front of shoulder, then even with shoulder and progress to looking behind you. To look to side, move head /eyes, then shoulder on side looking pulls back, shift more weight to side looking and pull hip back. Place one hand in middle of sink and let go with other  hand so your shoulder can pull back. Switch hands to look other way.   Then hover one hand and look over shoulder. If looking right, use left hand at sink. If looking left, use right hand at sink.  ASSESSMENT:  CLINICAL IMPRESSION: Emphasis of skilled session this visit on endurance and functional quad strength with carryover to gait training.  He continues to have bilateral knee flexion in stance with left being worse than right.  He continues to benefit from skilled PT to improve upright mobility, flexibility, and bilateral NMR to improve access to shelter environment and self care.  OBJECTIVE IMPAIRMENTS: Abnormal gait, decreased activity tolerance, decreased balance, decreased knowledge of use of DME, decreased mobility, decreased ROM, decreased strength, impaired perceived functional ability, impaired flexibility, impaired UE functional use, postural dysfunction, and prosthetic dependency .   ACTIVITY LIMITATIONS: carrying, lifting, bending, standing, squatting, stairs, transfers, bathing, and locomotion level  PARTICIPATION LIMITATIONS: meal prep, cleaning, laundry, driving, shopping, and community activity  PERSONAL FACTORS: Past/current experiences and 3+ comorbidities: see above  are also affecting patient's functional outcome.   REHAB POTENTIAL: Good  CLINICAL DECISION MAKING: Evolving/moderate complexity  EVALUATION COMPLEXITY: Moderate   GOALS: Goals reviewed with patient? Yes  SHORT TERM GOALS: Target date: 12/15/22  Pt will be IND with initial HEP in order to indicate improved functional mobility and dec fall risk. Baseline: None given at this time; patient reports that he doesn't have a place he can complete HEP despite modification 12/13/2022 Goal status: NOT MET  2.  Pt will improve gait speed to >/=1.8 ft/sec w/ RW in order to indicate dec fall risk.  Baseline: 1.13 ft/sec with RW; 1.97 ft/sec with RW Goal status: MET  3.  Pt will ambulate 150' with RW at S level in  order to indicate more independent household ambulation.   Baseline: 81' with RW at min A; 180' with RW + CGA-SBA Goal status: IN PROGRESS  4.  Pt will improve TUG to </=38 secs w/ RW at S level  in order to indicate dec fall risk   Baseline: 43.69 secs with RW and min A; 30.34" with RW + SBA Goal status: MET  5.  Pt will report wearing B prostheses 60% of awake hours, donning and doffing independently without skin issues.   Baseline: only wearing 30 mins a few times a day, needs some assist to don appropriately; 40% of the time wearing prosthetic Goal status: IN PROGRESS  6.  Will assess BERG balance test and improve score by 4 points from baseline in order to indicate dec fall risk.  Baseline: 24/56 (1/26)  Goal status: INITIAL  LONG TERM GOALS: Target date: 01/13/23  Pt will be IND with final HEP in order to indicate improved functional mobility and dec fall risk. Baseline: none initiated at this time  Goal status: INITIAL  2.  Pt will improve gait speed to >/=2.62 ft/sec w/ RW in order to indicate dec fall risk.  Baseline: 1.13 ft/sec with RW and min A; 1.97 ft/sec with RW  Goal status: IN PROGRESS  3.  Pt will negotiate up/down 4 steps with B rail, up/down ramp and curb and ambulate x 500' outdoors over unlevel paved surfaces with RW at S level in order to indicate improved community mobility.   Baseline: Not assessed due to time constraint Goal status: INITIAL  4.  Pt will improve TUG to </=30 secs w/ LRAD at S level in order to indicate dec fall risk   Baseline: 43.69 secs with RW and min A; 30.34" with RW + SBA Goal status: IN PROGRESS  5.  Pt will improve BERG balance score by 8 points from baseline in order to indicate dec fall risk.  Baseline: 24/56 (1/26) Goal status: INITIAL  6.  Will initiate gait training with quad tip cane/forearm crutch when appropriate and update goal.   Baseline: not assessed at this time  Goal status: INITIAL   PLAN:  PT FREQUENCY:  2x/week  PT DURATION: 8 weeks  PLANNED INTERVENTIONS: Therapeutic exercises, Therapeutic activity, Neuromuscular re-education, Balance training, Gait training, Patient/Family education, Self Care, Stair training, Vestibular training, Prosthetic training, DME instructions, and Manual therapy  PLAN FOR NEXT SESSION: Make additions to HEP for static unsupported and dynamic balance and quad strength!  Pt may be appropriate for AMPRO at STG deadline?, add to HEP for standing balance, hamstring stretching/hip flex stretch-add high level strength and balance to HEP (pt lives in shelter so space is limited), prosthetic education, gait with RW, obstacles, stairs/ramp/curb, endurance; follow up about urinal/RW, assess BERG  Maryruth Eve, PT, DPT [facility] [address] [place], [place]  [address] Phone:  [phone] Fax:  [phone] 12/13/22, 1:58 PM   Check all possible CPT codes: 97110- Therapeutic Exercise, [phone]- Neuro Re-education, [phone] - Gait Training, [phone] - Manual Therapy, 97530 - Therapeutic Activities, [phone] - Self Care, and [phone] - Prosthetic training    Check all conditions that are expected to impact treatment: Diabetes mellitus   If treatment provided at initial evaluation, no treatment charged due to lack of authorization.

## 2022-12-14 ENCOUNTER — Other Ambulatory Visit: Payer: Self-pay

## 2022-12-14 ENCOUNTER — Telehealth: Payer: Self-pay | Admitting: Critical Care Medicine

## 2022-12-14 DIAGNOSIS — E1121 Type 2 diabetes mellitus with diabetic nephropathy: Secondary | ICD-10-CM

## 2022-12-14 DIAGNOSIS — I5042 Chronic combined systolic (congestive) and diastolic (congestive) heart failure: Secondary | ICD-10-CM

## 2022-12-14 DIAGNOSIS — E1159 Type 2 diabetes mellitus with other circulatory complications: Secondary | ICD-10-CM

## 2022-12-14 DIAGNOSIS — E782 Mixed hyperlipidemia: Secondary | ICD-10-CM

## 2022-12-14 MED ORDER — METFORMIN HCL 500 MG PO TABS
500.0000 mg | ORAL_TABLET | Freq: Every day | ORAL | 6 refills | Status: DC
  Filled 2022-12-14: qty 30, 30d supply, fill #0

## 2022-12-14 MED ORDER — TORSEMIDE 20 MG PO TABS
20.0000 mg | ORAL_TABLET | Freq: Every day | ORAL | 1 refills | Status: DC
  Filled 2022-12-14 – 2023-01-03 (×2): qty 30, 30d supply, fill #0
  Filled 2023-01-31 – 2023-02-07 (×2): qty 30, 30d supply, fill #1

## 2022-12-14 MED ORDER — AMIODARONE HCL 200 MG PO TABS
200.0000 mg | ORAL_TABLET | Freq: Every day | ORAL | 2 refills | Status: DC
  Filled 2022-12-14: qty 30, 30d supply, fill #0
  Filled 2023-02-07: qty 30, 30d supply, fill #1

## 2022-12-14 MED ORDER — DIGOXIN 125 MCG PO TABS
0.1250 mg | ORAL_TABLET | Freq: Every evening | ORAL | 6 refills | Status: DC
  Filled 2022-12-14 – 2023-01-03 (×2): qty 30, 30d supply, fill #0
  Filled 2023-03-04: qty 30, 30d supply, fill #1

## 2022-12-14 MED ORDER — ENTRESTO 24-26 MG PO TABS
1.0000 | ORAL_TABLET | Freq: Two times a day (BID) | ORAL | 6 refills | Status: DC
  Filled 2023-01-03 – 2023-03-04 (×2): qty 60, 30d supply, fill #0

## 2022-12-14 MED ORDER — EMPAGLIFLOZIN 10 MG PO TABS
10.0000 mg | ORAL_TABLET | Freq: Every day | ORAL | 6 refills | Status: DC
  Filled 2022-12-14 – 2023-01-03 (×3): qty 30, 30d supply, fill #0
  Filled 2023-03-04: qty 30, 30d supply, fill #1
  Filled 2023-05-01: qty 30, 30d supply, fill #2

## 2022-12-14 MED ORDER — SPIRONOLACTONE 25 MG PO TABS
25.0000 mg | ORAL_TABLET | Freq: Every morning | ORAL | 6 refills | Status: DC
  Filled 2022-12-14 – 2023-01-03 (×2): qty 30, 30d supply, fill #0
  Filled 2023-01-31 – 2023-02-07 (×2): qty 30, 30d supply, fill #1
  Filled 2023-03-04: qty 30, 30d supply, fill #2

## 2022-12-14 MED ORDER — ATORVASTATIN CALCIUM 10 MG PO TABS
10.0000 mg | ORAL_TABLET | Freq: Every evening | ORAL | 6 refills | Status: DC
  Filled 2022-12-14 – 2023-01-03 (×3): qty 30, 30d supply, fill #0
  Filled 2023-01-31 – 2023-02-07 (×2): qty 30, 30d supply, fill #1
  Filled 2023-03-04: qty 30, 30d supply, fill #2
  Filled 2023-04-18: qty 30, 30d supply, fill #3

## 2022-12-14 MED ORDER — ISOSORB DINITRATE-HYDRALAZINE 20-37.5 MG PO TABS
1.0000 | ORAL_TABLET | Freq: Two times a day (BID) | ORAL | 6 refills | Status: DC
  Filled 2022-12-14 – 2023-01-03 (×2): qty 90, 45d supply, fill #0
  Filled 2023-03-04: qty 90, 45d supply, fill #1
  Filled 2023-05-01: qty 90, 45d supply, fill #2

## 2022-12-14 MED ORDER — CARVEDILOL 3.125 MG PO TABS
3.1250 mg | ORAL_TABLET | Freq: Two times a day (BID) | ORAL | 6 refills | Status: DC
  Filled 2022-12-14 – 2023-01-03 (×2): qty 60, 30d supply, fill #0
  Filled 2023-02-07: qty 60, 30d supply, fill #1
  Filled 2023-03-04: qty 60, 30d supply, fill #2

## 2022-12-14 NOTE — Telephone Encounter (Addendum)
He needs them all and also the entresto. Ty! I can get the congregational nurse to pick them up Tuesday.  Pls hold them filled until she comes

## 2022-12-14 NOTE — Telephone Encounter (Signed)
Scott Greer and adding Dr Wynetta Emery  I have been seeing this patient who resides at [facility] homeless shelter and trying to help him manage and take his medications.  He gets a pill roll pak from [facility]    the issue is with new insurance change this year his 10 medications cost $4 each = $40 which he does not have  We cannot use grant money to pay his copays-- is illegal  [name] can you see if we send his meds to our [facility] they would be willing to fill and place copay on account until he gets funds?  I am willing to write the transfer Rx  He is high risk pt  He has enough meds to last about 10 more days

## 2022-12-14 NOTE — Addendum Note (Signed)
Addended by: Daisy Blossom, Annie Main L on: 12/14/2022 02:49 PM   Modules accepted: Orders

## 2022-12-17 ENCOUNTER — Other Ambulatory Visit: Payer: Self-pay

## 2022-12-18 ENCOUNTER — Other Ambulatory Visit: Payer: Self-pay

## 2022-12-18 DIAGNOSIS — E1121 Type 2 diabetes mellitus with diabetic nephropathy: Secondary | ICD-10-CM

## 2022-12-18 LAB — GLUCOSE, POCT (MANUAL RESULT ENTRY): POC Glucose: 239 mg/dl — AB (ref 70–99)

## 2022-12-18 NOTE — Congregational Nurse Program (Signed)
  Dept: [phone]   Congregational Nurse Program Note  Date of Encounter: 12/18/2022  Clinic visit to check blood glucose and for broken wheelchair tire.  BP 169/81, pulse 62 and regular, O2 Sat 100%.  Blood glucose 239 ac lunch.  Call to physical therapist regarding wheelchair and call to [place] and left message for wheelchair repair. Past Medical History: Past Medical History:  Diagnosis Date   CHF (congestive heart failure) ([place])    Dehiscence of amputation stump ([place])    left great toe   Diabetes mellitus without complication ([place]) 9935   Type II   Glaucoma 2015   Hyperlipidemia    Hypertension 2005   Left foot infection 02/07/2021   Osteomyelitis ([place])    Wears glasses     Encounter Details:  CNP Questionnaire - 12/18/22 1020       Questionnaire   Ask client: Do you give verbal consent for me to treat you today? Yes    Student Assistance N/A    Location Patient [facility]    Visit Setting with Client Organization    Patient Status Unhoused    Insurance Medicaid    Insurance/Financial Assistance Referral N/A    Medication Have Medication Insecurities    Medical Provider Yes    Screening Referrals Made N/A    Medical Referrals Made N/A    Medical Appointment Made N/A    Recently w/o PCP, now 1st time PCP visit completed due to CNs referral or appointment made N/A    Food Have Food Insecurities    Transportation Need transportation assistance    Housing/Utilities No permanent housing    Interpersonal Safety Do not feel safe at current residence    Interventions Advocate/Support;Counsel;Educate;Case Management    Abnormal to Normal Screening Since Last CN Visit N/A    Screenings CN Performed Blood Pressure;Blood Glucose;Pulse Ox    Sent Client to Lab for: N/A    Did client attend any of the following based off CNs referral or appointments made? N/A    ED Visit Averted N/A    Life-Saving Intervention Made N/A

## 2022-12-19 ENCOUNTER — Encounter: Payer: Self-pay | Admitting: Physician Assistant

## 2022-12-19 ENCOUNTER — Encounter: Payer: Self-pay | Admitting: Rehabilitation

## 2022-12-19 ENCOUNTER — Ambulatory Visit: Payer: Medicaid Other | Admitting: Rehabilitation

## 2022-12-19 DIAGNOSIS — R293 Abnormal posture: Secondary | ICD-10-CM

## 2022-12-19 DIAGNOSIS — R2689 Other abnormalities of gait and mobility: Secondary | ICD-10-CM

## 2022-12-19 DIAGNOSIS — M6281 Muscle weakness (generalized): Secondary | ICD-10-CM

## 2022-12-19 DIAGNOSIS — R2681 Unsteadiness on feet: Secondary | ICD-10-CM

## 2022-12-19 NOTE — Progress Notes (Cosign Needed)
Pt seen by Dr Joya Gaskins.  He has his legs on and had PT today.   He is to see Dr Wynetta Emery on 02/16 at 4:10 pm, importance of keeping this appt was emphasized.   A wheel came off the wheelchair, he is to get another one.   Has copay on meds, $4 each, so now $40 for ten bottles of meds.  Not able to use [facility] any more, they require money up front.   Pt upset because he now has a copay, but it was explained that rules have changed.  Pt gets disability and SSI, says he will have trouble making these payments because of other money he owes.   Rosaria Ferries, PA-C 12/19/2022 4:40 PM  Patient seen and examined he has an upcoming appointment with his primary care provider encouraged to keep this appointment because of polyuria he also has an appointment February 27 with cardiology he is aware of this appointment as is the case manager at the shelter he knows he has had all his medications refilled and his co-pay is on the balance of $40 he has a broken wheelchair were going to get it replaced and his motorized wheelchair process is still pending he is aware of all this Asencion Noble

## 2022-12-19 NOTE — Therapy (Signed)
OUTPATIENT PHYSICAL THERAPY PROSTHETICS TREATMENT   Patient Name: Scott Greer MRN: [id number] DOB:Mar 29, 1956, 67 y.o., male Today's Date: 12/19/2022  PCP: Karle Plumber, MD REFERRING PROVIDER: Karle Plumber, MD   END OF SESSION:  PT End of Session - 12/19/22 1221     Visit Number 6    Number of Visits 17    Date for PT Re-Evaluation 01/13/23    Authorization Type [employer] (27 v limit)    Authorization - Visit Number 5    Authorization - Number of Visits 24    PT Start Time [id number]    PT Stop Time 1315    PT Time Calculation (min) 45 min    Equipment Utilized During Treatment Gait belt    Activity Tolerance Patient tolerated treatment well    Behavior During Therapy WFL for tasks assessed/performed              Past Medical History:  Diagnosis Date   CHF (congestive heart failure) ([place])    Dehiscence of amputation stump ([place])    left great toe   Diabetes mellitus without complication ([place]) [id number]   Type II   Glaucoma 2015   Hyperlipidemia    Hypertension 2005   Left foot infection 02/07/2021   Osteomyelitis ([place])    Wears glasses    Past Surgical History:  Procedure Laterality Date   AMPUTATION Left 06/05/2019   Procedure: LEFT GREAT TOE AMPUTATION;  Surgeon: Newt Minion, MD;  Location: [facility];  Service: Orthopedics;  Laterality: Left;   AMPUTATION Left 07/10/2019   Procedure: LEFT FOOT 1ST RAY AMPUTATION;  Surgeon: Newt Minion, MD;  Location: [facility];  Service: Orthopedics;  Laterality: Left;   AMPUTATION Right 05/25/2020   Procedure: RIGHT GREAT TOE AMPUTATION;  Surgeon: Newt Minion, MD;  Location: [facility];  Service: Orthopedics;  Laterality: Right;   AMPUTATION Left 01/25/2021   Procedure: LEFT 2ND TOE AMPUTATION;  Surgeon: Newt Minion, MD;  Location: [facility];  Service: Orthopedics;  Laterality: Left;   AMPUTATION Left 02/08/2021   Procedure: LEFT TRANSMETATARSAL AMPUTATION;  Surgeon: Newt Minion, MD;  Location: [facility];  Service: Orthopedics;   Laterality: Left;   AMPUTATION Left 02/10/2021   Procedure: AMPUTATION BELOW KNEE;  Surgeon: Newt Minion, MD;  Location: [facility];  Service: Orthopedics;  Laterality: Left;   AMPUTATION Right 04/14/2021   Procedure: RIGHT 1ST AND 2ND RAY AMPUTATION;  Surgeon: Newt Minion, MD;  Location: [facility];  Service: Orthopedics;  Laterality: Right;   AMPUTATION Right 04/26/2021   Procedure: RIGHT BELOW KNEE AMPUTATION;  Surgeon: Newt Minion, MD;  Location: [facility];  Service: Orthopedics;  Laterality: Right;   NO PAST SURGERIES     RIGHT/LEFT HEART CATH AND CORONARY ANGIOGRAPHY N/A 01/02/2021   Procedure: RIGHT/LEFT HEART CATH AND CORONARY ANGIOGRAPHY;  Surgeon: Lorretta Harp, MD;  Location: [facility];  Service: Cardiovascular;  Laterality: N/A;   Patient Active Problem List   Diagnosis Date Noted   Chronic combined systolic and diastolic CHF (congestive heart failure) ([place]) 12/01/2021   S/P BKA (below knee amputation) bilateral ([place]) 07/18/2021   Gangrene of right foot (HCC)    Foot pain, right 04/14/2021   Status post below-knee amputation ([place]) 02/17/2021   Wound dehiscence    Gangrene of toe of left foot (HCC)    Cutaneous abscess of left foot    Left foot infection 02/07/2021   Leukocytosis 02/07/2021   Thrombocytosis 02/07/2021   Hyponatremia  02/07/2021   AKI (acute kidney injury) ([place]) 02/07/2021   Hyperglycemia due to diabetes mellitus ([place]) 02/07/2021   New onset of congestive heart failure ([place]) 12/26/2020   CKD (chronic kidney disease) stage 3, GFR 30-59 ml/min (HCC) [id number]   Acute systolic CHF (congestive heart failure) ([place]) 12/26/2020   Anemia, chronic disease 06/22/2020   Amputee, great toe, right ([place]) 06/20/2020   Normocytic anemia 06/20/2020   Erectile dysfunction associated with type 2 diabetes mellitus ([place]) 06/20/2020   Positive for macroalbuminuria 10/24/2019   Amputation of left great toe ([place]) 10/23/2019   Tobacco dependence 10/23/2019   Diabetic foot  infection ([place])    Noncompliance    Glaucoma    Hyperlipidemia    Essential hypertension 11/06/2003   Diabetes mellitus ([place]) 11/05/1997    ONSET DATE: 11/02/2022 (referral date) (He reports he got legs on 12/26)  REFERRING DIAG: Z89.512,Z89.511 (ICD-10-CM) - S/P BKA (below knee amputation) bilateral (HCC)  THERAPY DIAG:  Other abnormalities of gait and mobility  Muscle weakness (generalized)  Abnormal posture  Unsteadiness on feet  Rationale for Evaluation and Treatment: Rehabilitation  SUBJECTIVE:   SUBJECTIVE STATEMENT: Pt reports he did get urinal but is difficult to use due to amount of people around him in [place].  He reports he gets new manual w/c today and is also getting a RW from a friend.   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  left BKA on 02/10/21 and R BKA on 04/26/21.   PAIN:  Are you having pain? No  VITALS  There were no vitals filed for this visit.    PRECAUTIONS: Fall  WEIGHT BEARING RESTRICTIONS: No  FALLS: Has patient fallen in last 6 months? Yes. Number of falls a few   LIVING ENVIRONMENT: Lives with: lives with their family and is homeless is living at [place] in: [place]: [place] layout: Two level Stairs: No Has following equipment at home: Wheelchair (manual)  PLOF: Independent with basic ADLs, Independent with community mobility with device, and Independent with homemaking with wheelchair  PATIENT GOALS: "I want to be able to get my own apartment.  I want to walk independently with these legs."  OBJECTIVE:   DIAGNOSTIC FINDINGS: n/a  COGNITION: Overall cognitive status: Within functional limits for tasks assessed   SENSATION: WFL  MUSCLE LENGTH: Some tightness noted in B knees in standing, unable to achieve full knee extension, lacking  approx 10 deg in standing.  POSTURE: rounded shoulders, forward head, and flexed trunk    LOWER EXTREMITY MMT:  MMT Right eval Left eval  Hip flexion 4/5 4/5  Hip extension    Hip abduction 4/5 4/5  Hip adduction 4/5 4/5  Hip internal rotation    Hip external rotation    Knee flexion 4/5 4/5  Knee extension 4/5 4/5  Ankle dorsiflexion    Ankle plantarflexion    Ankle inversion    Ankle eversion    (Blank rows = not tested) All tested in seated position.   TODAY'S TREATMENT:    Ambulation: Gait pattern: step through pattern, decreased stride length, knee flexed in stance- Right, knee flexed in stance- Left, trendelenburg, trunk flexed, narrow BOS, and poor foot clearance- Right Distance walked: 50' x 2  Assistive device utilized: Walker - 2 wheeled Level of assistance: SBA Comments: Pt continues to require cues for  upright posture and for improved prosthetic alignment when stepping RLE as he tends to adduct/IR but can correct with cuing.  Assisted to/from treatment room today for session as he again came in soiled and PT provided new brief, wash cloths to clean/towels to dry.  PT placed new chuck pad in w/c.  He is able to perform sit<>stand today from low mat at S level with cues for hand placement and forward weight shift.  He is also able to stand at [facility] without support for several seconds while buttoning pants.    Provided education that now that he will have RW that he needs to slowly build WB time and check skin in between bouts of standing/walking to ensure no skin breakdown.  Re-educated on sock ply adjustment and that he may need to do this during the day.        CURRENT PROSTHETIC WEAR ASSESSMENT: Patient is independent with: skin check and residual limb care Patient is dependent with: prosthetic cleaning, ply sock cleaning, correct ply sock adjustment, proper wear schedule/adjustment, and proper weight-bearing schedule/adjustment Donning prosthesis: Complete  Independence Doffing prosthesis: Complete Independence  Prosthetic wear tolerance: Reports he is wearing for about 2 hours a day or more depending on when he can go upstairs in the shelter after dinner.  Continued to encourage consistency with wear time esp now that he will have RW.  Prosthetic weight bearing tolerance: 2-3 minutes Edema: none noted  Residual limb condition: Pts skin is in excellent condition, good hydration and hair growth.  Prosthetic description: pin lock suspension with flexible inner socket.  He does 1 ply over this and then 5 ply over silicone liner.   K code/activity level with prosthetic use: Level 3  PATIENT EDUCATED ON FOLLOWING PROSTHETIC CARE: Prosthetic wear tolerance: 3 hours, 2x/day, 7 days/week Prosthetic weight bearing tolerance: 3-5 minutes Other education  Correct ply sock adjustment, [place] donning, Proper doffing, Proper wear schedule/adjustment, and Proper weight-bearing schedule/adjustment  PATIENT EDUCATION: Education details: Continue HEP, progress towards goals/outcomes Person educated: Patient Education method: Consulting civil engineer, Demonstration, and Verbal cues Education comprehension: verbalized understanding and needs further education  HOME EXERCISE PROGRAM: Amputee sink HEP Do each exercise 1-2  times per day Do each exercise 5-10 repetitions Hold each exercise for 2 seconds to feel your location Struck out item 3 due to limited availability in shelter.  AT [place] AND PLACE FEET EQUAL DISTANCE FROM THE MIDLINE.  Try to find this position when standing still for activities.   USE TAPE ON FLOOR TO MARK THE MIDLINE POSITION which is even with middle of sink.  You also should try to feel with your limb pressure in socket.  You are trying to feel with limb what you used to feel with the bottom of your foot.  Side to Side Shift: Moving your hips only (not shoulders): move weight onto your left leg, HOLD/FEEL pressure in  socket.  Move back to equal weight on each leg, HOLD/FEEL pressure in socket. Move weight onto your right leg, HOLD/FEEL pressure in socket. Move back to equal weight on each leg, HOLD/FEEL pressure in socket. Repeat.  Start with both hands on sink, progress to hand on prosthetic side only, then no hands.  Front to Back Shift: Moving your hips only (not shoulders): move your weight forward onto your toes, HOLD/FEEL pressure in socket. Move your weight back to equal Flat Foot on both legs, HOLD/FEEL  pressure in socket. Move your weight back onto your heels, HOLD/FEEL  pressure in socket. Move your weight back to equal on both legs, HOLD/FEEL  pressure in socket. Repeat.  Start with both hands on sink, progress to hand on prosthetic side only, then no hands.  Overhead/Upward Reaching: alternated reaching up to top cabinets or ceiling if no cabinets present. Keep equal weight on each leg. Start with one hand support on counter while other hand reaches and progress to no hand support with reaching.  ace one hand in middle of sink and reach with other hand. Do both arms.  Then hover one hand and move cups with other hand.  5.   Looking Over Shoulders: With equal weight on each leg: alternate turning to look over your shoulders with one hand support on counter as needed.  Start with head motions only to look in front of shoulder, then even with shoulder and progress to looking behind you. To look to side, move head /eyes, then shoulder on side looking pulls back, shift more weight to side looking and pull hip back. Place one hand in middle of sink and let go with other hand so your shoulder can pull back. Switch hands to look other way.   Then hover one hand and look over shoulder. If looking right, use left hand at sink. If looking left, use right hand at sink.  ASSESSMENT:  CLINICAL IMPRESSION: Continue to emphasize education on importance of maintaining dry skin to reduce infection and skin breakdown.  Reports  he will be getting new manual w/c and RW from friend today so will be able to walk to restroom.  Provided education on slow ramp up of wear time/WB time to avoid skin breakdown/soreness.  Continue to recommend he seek appt with PCP regarding urinary frequency and urgency as this seems to be a bigger issue than not being able to walk into restroom.  Note from his RN visit yesterday that blood glucose levels are well above 200 so this should be addressed.  Pt verbalized understanding to all.   OBJECTIVE IMPAIRMENTS: Abnormal gait, decreased activity tolerance, decreased balance, decreased knowledge of use of DME, decreased mobility, decreased ROM, decreased strength, impaired perceived functional ability, impaired flexibility, impaired UE functional use, postural dysfunction, and prosthetic dependency .   ACTIVITY LIMITATIONS: carrying, lifting, bending, standing, squatting, stairs, transfers, bathing, and locomotion level  PARTICIPATION LIMITATIONS: meal prep, cleaning, laundry, driving, shopping, and community activity  PERSONAL FACTORS: Past/current experiences and 3+ comorbidities: see above  are also affecting patient's functional outcome.   REHAB POTENTIAL: Good  CLINICAL DECISION MAKING: Evolving/moderate complexity  EVALUATION COMPLEXITY: Moderate   GOALS: Goals reviewed with patient? Yes  SHORT TERM GOALS: Target date: 12/15/22  Pt will be IND with initial HEP in order to indicate improved functional mobility and dec fall risk. Baseline: None given at this time; patient reports that he doesn't have a place he can complete HEP despite modification 12/13/2022 Goal status: NOT MET  2.  Pt will improve gait speed to >/=1.8 ft/sec w/ RW in order to indicate dec fall risk.  Baseline: 1.13 ft/sec with RW; 1.97 ft/sec with RW Goal status: MET  3.  Pt will ambulate 150' with RW at S level in order to indicate more independent household ambulation.   Baseline: 83' with RW at min A; 180'  with RW + CGA-SBA Goal status: IN PROGRESS  4.  Pt will improve TUG to </=38 secs w/ RW at S level in order to indicate dec fall risk   Baseline: 43.69 secs with RW  and min A; 30.34" with RW + SBA Goal status: MET  5.  Pt will report wearing B prostheses 60% of awake hours, donning and doffing independently without skin issues.   Baseline: only wearing 30 mins a few times a day, needs some assist to don appropriately; 40% of the time wearing prosthetic Goal status: IN PROGRESS  6.  Will assess BERG balance test and improve score by 4 points from baseline in order to indicate dec fall risk.  Baseline: 24/56 (1/26)  Goal status: INITIAL  LONG TERM GOALS: Target date: 01/13/23  Pt will be IND with final HEP in order to indicate improved functional mobility and dec fall risk. Baseline: none initiated at this time  Goal status: INITIAL  2.  Pt will improve gait speed to >/=2.62 ft/sec w/ RW in order to indicate dec fall risk.  Baseline: 1.13 ft/sec with RW and min A; 1.97 ft/sec with RW  Goal status: IN PROGRESS  3.  Pt will negotiate up/down 4 steps with B rail, up/down ramp and curb and ambulate x 500' outdoors over unlevel paved surfaces with RW at S level in order to indicate improved community mobility.   Baseline: Not assessed due to time constraint Goal status: INITIAL  4.  Pt will improve TUG to </=30 secs w/ LRAD at S level in order to indicate dec fall risk   Baseline: 43.69 secs with RW and min A; 30.34" with RW + SBA Goal status: IN PROGRESS  5.  Pt will improve BERG balance score by 8 points from baseline in order to indicate dec fall risk.  Baseline: 24/56 (1/26) Goal status: INITIAL  6.  Will initiate gait training with quad tip cane/forearm crutch when appropriate and update goal.   Baseline: not assessed at this time  Goal status: INITIAL   PLAN:  PT FREQUENCY: 2x/week  PT DURATION: 8 weeks  PLANNED INTERVENTIONS: Therapeutic exercises, Therapeutic  activity, Neuromuscular re-education, Balance training, Gait training, Patient/Family education, Self Care, Stair training, Vestibular training, Prosthetic training, DME instructions, and Manual therapy  PLAN FOR NEXT SESSION: How is walking with RW going at [place], check BERG for STG, Make additions to HEP for static unsupported and dynamic balance and quad strength!  Pt may be appropriate for AMPRO at STG deadline?, add to HEP for standing balance, hamstring stretching/hip flex stretch-add high level strength and balance to HEP (pt lives in shelter so space is limited), prosthetic education, gait with RW, obstacles, stairs/ramp/curb, endurance;  Cameron Sprang, PT, MPT [facility] [address] [place], [place], [address] Phone: [phone]   Fax:  [phone] 12/19/22, 1:28 PM    Check all possible CPT codes: 97110- Therapeutic Exercise, [phone]- Neuro Re-education, [phone] - Gait Training, [phone] - Manual Therapy, 97530 - Therapeutic Activities, [phone] - [place], and [phone] - Prosthetic training    Check all conditions that are expected to impact treatment: Diabetes mellitus   If treatment provided at initial evaluation, no treatment charged due to lack of authorization.

## 2022-12-20 ENCOUNTER — Telehealth (HOSPITAL_COMMUNITY): Payer: Self-pay | Admitting: Vascular Surgery

## 2022-12-20 NOTE — Telephone Encounter (Signed)
Called pt to make f/u appt w/ NP/APP

## 2022-12-21 ENCOUNTER — Ambulatory Visit: Payer: Medicaid Other | Attending: Internal Medicine | Admitting: Internal Medicine

## 2022-12-21 ENCOUNTER — Other Ambulatory Visit: Payer: Self-pay

## 2022-12-21 ENCOUNTER — Encounter: Payer: Self-pay | Admitting: Physical Therapy

## 2022-12-21 ENCOUNTER — Ambulatory Visit: Payer: Medicaid Other | Admitting: Physical Therapy

## 2022-12-21 ENCOUNTER — Encounter: Payer: Self-pay | Admitting: Internal Medicine

## 2022-12-21 VITALS — BP 90/47 | HR 69

## 2022-12-21 VITALS — BP 145/80 | HR 63 | Temp 97.9°F

## 2022-12-21 DIAGNOSIS — Z89512 Acquired absence of left leg below knee: Secondary | ICD-10-CM

## 2022-12-21 DIAGNOSIS — Z89511 Acquired absence of right leg below knee: Secondary | ICD-10-CM | POA: Diagnosis not present

## 2022-12-21 DIAGNOSIS — R2689 Other abnormalities of gait and mobility: Secondary | ICD-10-CM | POA: Diagnosis not present

## 2022-12-21 DIAGNOSIS — M6281 Muscle weakness (generalized): Secondary | ICD-10-CM

## 2022-12-21 DIAGNOSIS — E1159 Type 2 diabetes mellitus with other circulatory complications: Secondary | ICD-10-CM

## 2022-12-21 DIAGNOSIS — N1832 Chronic kidney disease, stage 3b: Secondary | ICD-10-CM

## 2022-12-21 DIAGNOSIS — I152 Hypertension secondary to endocrine disorders: Secondary | ICD-10-CM | POA: Diagnosis not present

## 2022-12-21 DIAGNOSIS — I5042 Chronic combined systolic (congestive) and diastolic (congestive) heart failure: Secondary | ICD-10-CM

## 2022-12-21 DIAGNOSIS — Z5900 Homelessness unspecified: Secondary | ICD-10-CM

## 2022-12-21 DIAGNOSIS — E1121 Type 2 diabetes mellitus with diabetic nephropathy: Secondary | ICD-10-CM | POA: Diagnosis not present

## 2022-12-21 DIAGNOSIS — R2681 Unsteadiness on feet: Secondary | ICD-10-CM

## 2022-12-21 DIAGNOSIS — R293 Abnormal posture: Secondary | ICD-10-CM

## 2022-12-21 LAB — GLUCOSE, POCT (MANUAL RESULT ENTRY): POC Glucose: 186 mg/dl — AB (ref 70–99)

## 2022-12-21 LAB — POCT GLYCOSYLATED HEMOGLOBIN (HGB A1C): HbA1c, POC (controlled diabetic range): 7.3 % — AB (ref 0.0–7.0)

## 2022-12-21 MED ORDER — GLIMEPIRIDE 1 MG PO TABS
1.0000 mg | ORAL_TABLET | Freq: Every day | ORAL | 6 refills | Status: DC
  Filled 2022-12-21: qty 30, 30d supply, fill #0
  Filled 2023-01-31 – 2023-02-07 (×2): qty 30, 30d supply, fill #1
  Filled 2023-03-04: qty 30, 30d supply, fill #2

## 2022-12-21 NOTE — Patient Instructions (Addendum)
STOP METFORMIN.  START GLIMEPIRIDE instead.  I have sent this prescription to our pharmacy for the nurse to pick up for you next week.

## 2022-12-21 NOTE — Progress Notes (Signed)
Patient ID: Scott Greer, male    DOB: 1956/04/24  MRN: [id number]  CC: Diabetes (Dm f/u. Med refill - curretnly facing financial hardship /Glaucoma - requesting a referral to opthomology. Missed previous appts due to homelessness. [name] what meds he is taking/Already received flu vax. )   Subjective: Scott Greer is a 67 y.o. 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   [facility].  States he was told that he would have a co-pay of $40 when they deliver his medications.  He cannot afford this.  Medications were sent to our pharmacy instead.  Picked up by [employer] nurse from the shelter.  He does not have his medicines with him today and does not recall the names.  DM:  Results for orders placed or performed in visit on 12/21/22  POCT glucose (manual entry)  Result Value Ref Range   POC Glucose 186 (A) 70 - 99 mg/dl  POCT glycosylated hemoglobin (Hb A1C)  Result Value Ref Range   Hemoglobin A1C     HbA1c POC (<> result, manual entry)     HbA1c, POC (prediabetic range)     HbA1c, POC (controlled diabetic range) 7.3 (A) 0.0 - 7.0 %  Patient is on metformin and Jardiance 10 mg daily.  I had decreased the dose of metformin to 500 mg once a day after last visit with me in October due to decreasing GFR.  Blood sugar checked by nurse at the homeless shelter.  Reports his readings have been good When he eats sweets. I note that he had chemistry done back in December and GFR had decreased further to 38. Requests referral again to the eye specialist for history of glaucoma.  We have had difficulty getting him to an eye doctor because his phone goes in and out and at other times transportation issues.  Bilateral BKA:  Wearing his prosthetic legs today.  Working with physical therapy.  Chronic combined CHF/HTN: Denies any chest pains or shortness of breath.  Swelling in his stumps.  Should be on amiodarone 20 mg daily, carvedilol 3.125 mg twice a day, digoxin 0.125 mg daily, torsemide 20 mg daily, spironolactone 25 mg daily and Entresto 24/26 mg BID Reports that his blood pressure is checked intermittently by nurse at the shelter and that his readings have been good.   Patient Active Problem List   Diagnosis Date Noted   Chronic combined systolic and diastolic CHF (congestive heart failure) ([place]) 12/01/2021   S/P BKA (below knee amputation) bilateral ([place]) 07/18/2021   Gangrene of right foot (HCC)    Foot pain, right 04/14/2021   Status post below-knee amputation ([place]) 02/17/2021   Wound dehiscence    Gangrene of toe of left foot (HCC)    Cutaneous abscess of left foot    Left foot infection 02/07/2021   Leukocytosis 02/07/2021   Thrombocytosis 02/07/2021   Hyponatremia 02/07/2021   AKI (acute kidney injury) ([place]) 02/07/2021   Hyperglycemia due to diabetes mellitus ([place]) 02/07/2021   New onset of congestive heart failure ([place]) 12/26/2020   CKD (chronic kidney disease) stage 3, GFR 30-59 ml/min (HCC) [id number]   Acute systolic CHF (congestive heart failure) ([place]) 12/26/2020   Anemia, chronic disease 06/22/2020   Amputee, great  toe, right ([place]) 06/20/2020   Normocytic anemia 06/20/2020   Erectile dysfunction associated with type 2 diabetes mellitus ([place]) 06/20/2020   Positive for macroalbuminuria 10/24/2019   Amputation of left great toe ([place]) 10/23/2019   Tobacco dependence 10/23/2019   Diabetic foot infection ([place])    Noncompliance    Glaucoma    Hyperlipidemia    Essential hypertension 11/06/2003   Diabetes mellitus ([place]) 11/05/1997     Current Outpatient Medications on File Prior to Visit  Medication Sig Dispense Refill   Accu-Chek Softclix Lancets lancets Use to test blood sugars up to  4 times daily as directed. (Patient not taking: Reported on 10/25/2022) 100 each 0   amiodarone (PACERONE) 200 MG tablet Take 1 tablet (200 mg total) by mouth daily. 30 tablet 2   aspirin (ASPIRIN LOW DOSE) 81 MG chewable tablet CHEW ONE TABLET BY MOUTH ONCE DAILY Needs appointment for further refills 30 tablet 2   atorvastatin (LIPITOR) 10 MG tablet Take 1 tablet (10 mg total) by mouth every evening. 30 tablet 6   Blood Glucose Monitoring Suppl (ACCU-CHEK GUIDE) w/Device KIT Use to test blood sugars up to 4 times daily as directed. (Patient not taking: Reported on 10/25/2022) 1 kit 0   Blood Glucose Monitoring Suppl (TRUE METRIX METER) w/Device KIT Use as directed (Patient not taking: Reported on 10/25/2022) 1 kit 0   carvedilol (COREG) 3.125 MG tablet Take 1 tablet (3.125 mg total) by mouth 2 (two) times daily with a meal. 60 tablet 6   Continuous Blood Gluc Receiver (FREESTYLE LIBRE 2 READER) DEVI UAD (Patient not taking: Reported on 10/25/2022) 1 each 0   Continuous Blood Gluc Sensor (FREESTYLE LIBRE SENSOR SYSTEM) MISC Change sensor Q 2 wks (Patient not taking: Reported on 10/25/2022) 2 each 12   digoxin (LANOXIN) 0.125 MG tablet Take 1 tablet (0.125 mg total) by mouth every evening with supper. 30 tablet 6   empagliflozin (JARDIANCE) 10 MG TABS tablet Take 1 tablet (10 mg total) by mouth daily at 12 noon. 30 tablet 6   ferrous sulfate (FEROSUL) 325 (65 FE) MG tablet TAKE ONE TABLET BY MOUTH EVERY MORNING WITH BREAKFAST 100 tablet 1   gabapentin (NEURONTIN) 300 MG capsule Take 1 capsule (300 mg total) by mouth 2 (two) times daily. 180 capsule 1   glucose blood (ACCU-CHEK GUIDE) test strip Use to test blood sugars up to 4 times daily as directed. (Patient not taking: Reported on 10/25/2022) 100 each 12   isosorbide-hydrALAZINE (BIDIL) 20-37.5 MG tablet Take 1 tablet by mouth 2 (two) times daily. 90 tablet 6   metFORMIN (GLUCOPHAGE) 500 MG tablet Take 1 tablet (500 mg total) by mouth daily with  breakfast. 30 tablet 6   nutrition supplement, JUVEN, (JUVEN) PACK Take 1 packet by mouth 2 (two) times daily between meals. (Patient not taking: Reported on 10/25/2022)  0   sacubitril-valsartan (ENTRESTO) 24-26 MG Take 1 tablet by mouth 2 (two) times daily. 60 tablet 6   spironolactone (ALDACTONE) 25 MG tablet Take 1 tablet (25 mg total) by mouth every morning. 30 tablet 6   torsemide (DEMADEX) 20 MG tablet Take 1 tablet (20 mg total) by mouth daily. 30 tablet 1   zinc sulfate 220 (50 Zn) MG capsule Take 1 capsule (220 mg total) by mouth daily. (Patient not taking: Reported on 10/25/2022)     No current facility-administered medications on file prior to visit.    Allergies  Allergen Reactions   Bee Venom Hives, Itching and Swelling  Social History   Socioeconomic History   Marital status: Divorced    Spouse name: Tawana   Number of children: 2   Years of education: Not on file   Highest education level: Associate degree: occupational, Hotel manager, or vocational program  Occupational History   Not on file  Tobacco Use   Smoking status: Every Day    Types: Cigars   Smokeless tobacco: Former    Types: Chew   Tobacco comments:    8 daily  Vaping Use   Vaping Use: Never used  Substance and Sexual Activity   Alcohol use: Yes    Alcohol/week: 11.0 standard drinks of alcohol    Types: 3 Cans of beer, 8 Shots of liquor per week    Comment: occasional   Drug use: Yes    Types: Marijuana    Comment: ocassional - last time 05/08/2020   Sexual activity: Not Currently  Other Topics Concern   Not on file  Social History Narrative   Out of prison for 2 months.   Lives at home with his wife.   Social Determinants of Health   Financial Resource Strain: High Risk (10/18/2021)   Overall Financial Resource Strain (CARDIA)    Difficulty of Paying Living Expenses: Very hard  Food Insecurity: Food Insecurity Present (11/20/2022)   Hunger Vital Sign    Worried About Running Out of Food  in the Last Year: Sometimes true    Ran Out of Food in the Last Year: Sometimes true  Transportation Needs: Unmet Transportation Needs (11/28/2022)   PRAPARE - Hydrologist (Medical): Yes    Lack of Transportation (Non-Medical): Yes  Physical Activity: Inactive (12/11/2021)   Exercise Vital Sign    Days of Exercise per Week: 0 days    Minutes of Exercise per Session: 0 min  Stress: Stress Concern Present (02/27/2022)   [place]    Feeling of Stress : Very much  Social Connections: Socially Isolated (10/18/2021)   Social Connection and Isolation Panel [NHANES]    Frequency of Communication with Friends and Family: Three times a week    Frequency of Social Gatherings with Friends and Family: Twice a week    Attends Religious Services: Never    Marine scientist or Organizations: No    Attends Archivist Meetings: Never    Marital Status: Divorced  Human resources officer Violence: Not on file    Family History  Problem Relation Age of Onset   Stroke Mother    Diabetes Mother        Toward end of life    Past Surgical History:  Procedure Laterality Date   AMPUTATION Left 06/05/2019   Procedure: LEFT GREAT TOE AMPUTATION;  Surgeon: Newt Minion, MD;  Location: [facility];  Service: Orthopedics;  Laterality: Left;   AMPUTATION Left 07/10/2019   Procedure: LEFT FOOT 1ST RAY AMPUTATION;  Surgeon: Newt Minion, MD;  Location: [facility];  Service: Orthopedics;  Laterality: Left;   AMPUTATION Right 05/25/2020   Procedure: RIGHT GREAT TOE AMPUTATION;  Surgeon: Newt Minion, MD;  Location: [facility];  Service: Orthopedics;  Laterality: Right;   AMPUTATION Left 01/25/2021   Procedure: LEFT 2ND TOE AMPUTATION;  Surgeon: Newt Minion, MD;  Location: [facility];  Service: Orthopedics;  Laterality: Left;   AMPUTATION Left 02/08/2021   Procedure: LEFT TRANSMETATARSAL AMPUTATION;  Surgeon: Newt Minion, MD;   Location: [facility];  Service:  Orthopedics;  Laterality: Left;   AMPUTATION Left 02/10/2021   Procedure: AMPUTATION BELOW KNEE;  Surgeon: Newt Minion, MD;  Location: [facility];  Service: Orthopedics;  Laterality: Left;   AMPUTATION Right 04/14/2021   Procedure: RIGHT 1ST AND 2ND RAY AMPUTATION;  Surgeon: Newt Minion, MD;  Location: [facility];  Service: Orthopedics;  Laterality: Right;   AMPUTATION Right 04/26/2021   Procedure: RIGHT BELOW KNEE AMPUTATION;  Surgeon: Newt Minion, MD;  Location: [facility];  Service: Orthopedics;  Laterality: Right;   NO PAST SURGERIES     RIGHT/LEFT HEART CATH AND CORONARY ANGIOGRAPHY N/A 01/02/2021   Procedure: RIGHT/LEFT HEART CATH AND CORONARY ANGIOGRAPHY;  Surgeon: Lorretta Harp, MD;  Location: [facility];  Service: Cardiovascular;  Laterality: N/A;    ROS: Review of Systems Negative except as stated above  PHYSICAL EXAM: BP (!) 158/80 (BP Location: Left Arm, Patient Position: Sitting, Cuff Size: Normal)   Pulse 63   Temp 97.9 F (36.6 C) (Oral)   SpO2 100%   Physical Exam   General appearance - alert, well appearing, older African-American male sitting in wheelchair and in no distress.  He is wearing prosthetic legs for his bilateral BKA Mental status -patient answers questions appropriately. Chest - clear to auscultation, no wheezes, rales or rhonchi, symmetric air entry Heart - normal rate, regular rhythm, normal S1, S2, no murmurs, rubs, clicks or gallops Extremities -patient has prosthetic legs on.     Latest Ref Rng & Units 10/15/2022    9:30 PM 08/14/2022    2:58 PM 03/14/2022    2:45 AM  CMP  Glucose 70 - 99 mg/dL 102  208  286   BUN 8 - 23 mg/dL 27  21  15   $ Creatinine 0.61 - 1.24 mg/dL 1.94  1.64  1.45   Sodium 135 - 145 mmol/L 136  144  142   Potassium 3.5 - 5.1 mmol/L 3.6  4.3  3.5   Chloride 98 - 111 mmol/L 100  103  108   CO2 22 - 32 mmol/L 20  27  28   $ Calcium 8.9 - 10.3 mg/dL 9.0  9.1  9.0   Total Protein 6.5 - 8.1 g/dL  6.6  6.5    Total Bilirubin 0.3 - 1.2 mg/dL 0.8  0.4    Alkaline Phos 38 - 126 U/L 39  51    AST 15 - 41 U/L 36  10    ALT 0 - 44 U/L 15  8     Lipid Panel     Component Value Date/Time   CHOL 157 08/14/2022 1458   TRIG 153 (H) 08/14/2022 1458   HDL 60 08/14/2022 1458   CHOLHDL 2.6 08/14/2022 1458   LDLCALC 71 08/14/2022 1458    CBC    Component Value Date/Time   WBC 3.8 (L) 10/15/2022 2130   RBC 3.75 (L) 10/15/2022 2130   HGB 10.7 (L) 10/15/2022 2130   HGB 10.5 (L) 08/14/2022 1458   HCT 32.5 (L) 10/15/2022 2130   HCT 32.2 (L) 08/14/2022 1458   PLT 216 10/15/2022 2130   PLT 269 08/14/2022 1458   MCV 86.7 10/15/2022 2130   MCV 85 08/14/2022 1458   MCH 28.5 10/15/2022 2130   MCHC 32.9 10/15/2022 2130   RDW 15.4 10/15/2022 2130   RDW 14.8 08/14/2022 1458   LYMPHSABS 0.7 10/15/2022 2130   LYMPHSABS 0.7 12/14/2020 1156   MONOABS 0.9 10/15/2022 2130   EOSABS 0.0 10/15/2022 2130  EOSABS 0.0 12/14/2020 1156   BASOSABS 0.0 10/15/2022 2130   BASOSABS 0.0 12/14/2020 1156    ASSESSMENT AND PLAN:  1. Type 2 diabetes mellitus with diabetic nephropathy, without long-term current use of insulin (HCC) Given his falling GFR, I recommend stopping metformin.  Discussed putting him on low-dose of Lantus insulin at bedtime.  Patient declines stating he does not want to do any injections.  He will continue Jardiance.  Add low-dose glimepiride.  Advised him to show the discharge summary note to the [facility] nurse at the shelter so that she is aware that we have stopped the metformin and they will send someone to pick up the glimepiride. - POCT glucose (manual entry) - POCT glycosylated hemoglobin (Hb A1C) - glimepiride (AMARYL) 1 MG tablet; Take 1 tablet (1 mg total) by mouth daily with breakfast.  Dispense: 30 tablet; Refill: 6  2. S/P BKA (below knee amputation) bilateral ([place]) Working with physical therapy to get more comfortable wearing his prosthetic legs.  3. Chronic  combined systolic and diastolic CHF (congestive heart failure) ([place]) Appears compensated today.  He will continue the medications listed above.  4. Stage 3b chronic kidney disease ([place]) See #1 above.  5. Hypertension associated with type 2 diabetes mellitus ([place]) Not at goal but repeat blood pressure reading was better.  He will continue his current medications  6. Homeless Patient now gets his medications through our pharmacy as he is not able to afford co-pay for [facility].    Patient was given the opportunity to ask questions.  Patient verbalized understanding of the plan and was able to repeat key elements of the plan.   This documentation was completed using Radio producer.  Any transcriptional errors are unintentional.  Orders Placed This Encounter  Procedures   POCT glucose (manual entry)   POCT glycosylated hemoglobin (Hb A1C)     Requested Prescriptions    No prescriptions requested or ordered in this encounter    No follow-ups on file.  Karle Plumber, MD, FACP

## 2022-12-21 NOTE — Patient Instructions (Signed)
Access Code: [email or web address] URL: [email or web address] Date: 12/21/2022 Prepared by: Elease Etienne  Exercises - Sit to Stand with Armchair  - 1 x daily - 7 x weekly - 2 sets - 10 reps - Seated Short Arc Quad  - 1 x daily - 7 x weekly - 2 sets - 10 reps - 3 seconds hold - Seated Hamstring Stretch  - 1 x daily - 7 x weekly - 1 sets - 3 reps - 30-45 seconds hold

## 2022-12-21 NOTE — Therapy (Signed)
OUTPATIENT PHYSICAL THERAPY PROSTHETICS TREATMENT   Patient Name: Scott Greer MRN: [id number] DOB:[birth year], 67 y.o., male Today's Date: 12/21/2022  PCP: Karle Plumber, MD REFERRING PROVIDER: Karle Plumber, MD   END OF SESSION:  PT End of Session - 12/21/22 1109     Visit Number 7    Number of Visits 17    Date for PT Re-Evaluation 01/13/23    Authorization Type [employer] (27 v limit)    Authorization - Number of Visits 24    PT Start Time 1102    PT Stop Time 1150    PT Time Calculation (min) 48 min    Equipment Utilized During Treatment Gait belt    Activity Tolerance Patient tolerated treatment well    Behavior During Therapy WFL for tasks assessed/performed              Past Medical History:  Diagnosis Date   CHF (congestive heart failure) ([place])    Dehiscence of amputation stump ([place])    left great toe   Diabetes mellitus without complication ([place]) [id number]   Type II   Glaucoma 2015   Hyperlipidemia    Hypertension 2005   Left foot infection 02/07/2021   Osteomyelitis ([place])    Wears glasses    Past Surgical History:  Procedure Laterality Date   AMPUTATION Left 06/05/2019   Procedure: LEFT GREAT TOE AMPUTATION;  Surgeon: Newt Minion, MD;  Location: [facility];  Service: Orthopedics;  Laterality: Left;   AMPUTATION Left 07/10/2019   Procedure: LEFT FOOT 1ST RAY AMPUTATION;  Surgeon: Newt Minion, MD;  Location: [facility];  Service: Orthopedics;  Laterality: Left;   AMPUTATION Right 05/25/2020   Procedure: RIGHT GREAT TOE AMPUTATION;  Surgeon: Newt Minion, MD;  Location: [facility];  Service: Orthopedics;  Laterality: Right;   AMPUTATION Left 01/25/2021   Procedure: LEFT 2ND TOE AMPUTATION;  Surgeon: Newt Minion, MD;  Location: [facility];  Service: Orthopedics;  Laterality: Left;   AMPUTATION Left 02/08/2021   Procedure: LEFT TRANSMETATARSAL AMPUTATION;  Surgeon: Newt Minion, MD;  Location: [facility];  Service: Orthopedics;  Laterality: Left;   AMPUTATION  Left 02/10/2021   Procedure: AMPUTATION BELOW KNEE;  Surgeon: Newt Minion, MD;  Location: [facility];  Service: Orthopedics;  Laterality: Left;   AMPUTATION Right 04/14/2021   Procedure: RIGHT 1ST AND 2ND RAY AMPUTATION;  Surgeon: Newt Minion, MD;  Location: [facility];  Service: Orthopedics;  Laterality: Right;   AMPUTATION Right 04/26/2021   Procedure: RIGHT BELOW KNEE AMPUTATION;  Surgeon: Newt Minion, MD;  Location: [facility];  Service: Orthopedics;  Laterality: Right;   NO PAST SURGERIES     RIGHT/LEFT HEART CATH AND CORONARY ANGIOGRAPHY N/A 01/02/2021   Procedure: RIGHT/LEFT HEART CATH AND CORONARY ANGIOGRAPHY;  Surgeon: Lorretta Harp, MD;  Location: [facility];  Service: Cardiovascular;  Laterality: N/A;   Patient Active Problem List   Diagnosis Date Noted   Chronic combined systolic and diastolic CHF (congestive heart failure) ([place]) 12/01/2021   S/P BKA (below knee amputation) bilateral ([place]) 07/18/2021   Gangrene of right foot (HCC)    Foot pain, right 04/14/2021   Status post below-knee amputation ([place]) 02/17/2021   Wound dehiscence    Gangrene of toe of left foot (HCC)    Cutaneous abscess of left foot    Left foot infection 02/07/2021   Leukocytosis 02/07/2021   Thrombocytosis 02/07/2021   Hyponatremia 02/07/2021   AKI (acute kidney injury) ([place])  02/07/2021   Hyperglycemia due to diabetes mellitus ([place]) 02/07/2021   New onset of congestive heart failure ([place]) 12/26/2020   CKD (chronic kidney disease) stage 3, GFR 30-59 ml/min (HCC) [id number]   Acute systolic CHF (congestive heart failure) ([place]) 12/26/2020   Anemia, chronic disease 06/22/2020   Amputee, great toe, right ([place]) 06/20/2020   Normocytic anemia 06/20/2020   Erectile dysfunction associated with type 2 diabetes mellitus ([place]) 06/20/2020   Positive for macroalbuminuria 10/24/2019   Amputation of left great toe ([place]) 10/23/2019   Tobacco dependence 10/23/2019   Diabetic foot infection ([place])    Noncompliance     Glaucoma    Hyperlipidemia    Essential hypertension 11/06/2003   Diabetes mellitus ([place]) 11/05/1997    ONSET DATE: 11/02/2022 (referral date) (He reports he got legs on 12/26)  REFERRING DIAG: Z89.512,Z89.511 (ICD-10-CM) - S/P BKA (below knee amputation) bilateral (HCC)  THERAPY DIAG:  Other abnormalities of gait and mobility  Muscle weakness (generalized)  Abnormal posture  Unsteadiness on feet  Rationale for Evaluation and Treatment: Rehabilitation  SUBJECTIVE:   SUBJECTIVE STATEMENT: Pt is using the urinal at nighttime, but has not been able to use it during the daytime.  Pt denies falls, he presents to clinic in wheelchair without RW.  He has not been able to bring his walker downstairs at shelter due to time constraint to be downstairs in time for breakfast.  He has been using the walker at night to get into the bathroom.  Pt perseverates on past surgical history and ongoing frustration, failed repeated attempts at redirection.  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  left BKA on 02/10/21 and R BKA on 04/26/21.   PAIN:  Are you having pain? No  VITALS  Vitals:   12/21/22 1110  BP: (!) 90/47  Pulse: 69      PRECAUTIONS: Fall  WEIGHT BEARING RESTRICTIONS: No  FALLS: Has patient fallen in last 6 months? Yes. Number of falls a few   LIVING ENVIRONMENT: Lives with: lives with their family and is homeless is living at [place] in: [place]: [place] layout: Two level Stairs: No Has following equipment at home: Wheelchair (manual)  PLOF: Independent with basic ADLs, Independent with community mobility with device, and Independent with homemaking with wheelchair  PATIENT GOALS: "I want to be able to get my own apartment.  I want to walk independently with these legs."  OBJECTIVE:    DIAGNOSTIC FINDINGS: n/a  COGNITION: Overall cognitive status: Within functional limits for tasks assessed   SENSATION: WFL  MUSCLE LENGTH: Some tightness noted in B knees in standing, unable to achieve full knee extension, lacking approx 10 deg in standing.  POSTURE: rounded shoulders, forward head, and flexed trunk    LOWER EXTREMITY MMT:  MMT Right eval Left eval  Hip flexion 4/5 4/5  Hip extension    Hip abduction 4/5 4/5  Hip adduction 4/5 4/5  Hip internal rotation    Hip external rotation    Knee flexion 4/5 4/5  Knee extension 4/5 4/5  Ankle dorsiflexion    Ankle plantarflexion    Ankle inversion    Ankle eversion    (Blank rows = not tested) All tested in seated position.   TODAY'S TREATMENT:  -BERG:  [facility] PT Assessment - 12/21/22 1133  Berg Balance Test   Sit to Stand Able to stand  independently using hands    Standing Unsupported Able to stand 2 minutes with supervision    Sitting with Back Unsupported but Feet Supported on Floor or Stool Able to sit safely and securely 2 minutes    Stand to Sit Controls descent by using hands    Transfers Able to transfer safely, definite need of hands    Standing Unsupported with Eyes Closed Able to stand 10 seconds with supervision    Standing Unsupported with Feet Together Able to place feet together independently and stand for 1 minute with supervision   able to touch feet together, mild tension in low back   From Standing, Reach Forward with Outstretched Arm Can reach forward >5 cm safely (2")   uses staggered stance unsupported   From Standing Position, Pick up Object from Floor Unable to pick up and needs supervision    From Standing Position, Turn to Look Behind Over each Shoulder Turn sideways only but maintains balance    Turn 360 Degrees Needs assistance while turning    Standing Unsupported, Alternately Place Feet on Step/Stool Able to complete >2 steps/needs minimal assist    Standing Unsupported,  One Foot in Front Needs help to step but can hold 15 seconds    Standing on One Leg Tries to lift leg/unable to hold 3 seconds but remains standing independently    Total Score 30    Berg comment: High fall risk            -STS x8, focused on sequencing of hands on RW and W/C for safe transfer -SAQ from w/c x10 each LE  to promote ability to perform when sitting outside during the day -Seated hamstring stretch x45 seconds each LE  Ambulation: Gait pattern: step through pattern, decreased stride length, knee flexed in stance- Right, knee flexed in stance- Left, trendelenburg, trunk flexed, narrow BOS, and poor foot clearance- Right Distance walked: 200'  Assistive device utilized: Environmental consultant - 2 wheeled Level of assistance: SBA Comments: Same from prior:  Pt continues to require cues for upright posture and for improved prosthetic alignment when stepping RLE as he tends to adduct/IR but can correct with cuing.   12/21/2022:  Pt has single instance of left turn w/ crossover step corrected w/ verbal cue.  Pt has single instance of risky behavior demonstrating drastic push away of RW when therapist instructs pt to maintain safe approximation as he explains that this sometimes happens to him when he is walking in the shelter.  His forward trunk lean is contributing to difficulty managing approximation to RW as distance increases.     Provided education on safety w/ RW.  Time spent disposing of and replacing towel and soaked chuck pad which pt endorses is from putting in the shower this morning.  He did not wish to change pants though they were also wet, denies soiled brief.  CURRENT PROSTHETIC WEAR ASSESSMENT: Patient is independent with: skin check and residual limb care Patient is dependent with: prosthetic cleaning, ply sock cleaning, correct ply sock adjustment, proper wear schedule/adjustment, and proper weight-bearing schedule/adjustment Donning prosthesis: Complete Independence Doffing  prosthesis: Complete Independence  Prosthetic wear tolerance: ONGOING:  Reports he is wearing for about 2 hours a day or more depending on when he can go upstairs in the shelter after dinner.  Continued to encourage consistency with wear time esp now that he will have RW.  When he leaves PT he wears  prosthetics remainder of day. Prosthetic weight bearing tolerance: 2-3 minutes Edema: none noted  Residual limb condition: Pts skin is in excellent condition, good hydration and hair growth.  Prosthetic description: pin lock suspension with flexible inner socket.  He does 1 ply over this and then 5 ply over silicone liner.   K code/activity level with prosthetic use: Level 3  PATIENT EDUCATED ON FOLLOWING PROSTHETIC CARE: Prosthetic wear tolerance: 3 hours, 2x/day, 7 days/week Prosthetic weight bearing tolerance: 3-5 minutes Other education  Correct ply sock adjustment, Propper donning, Proper doffing, Proper wear schedule/adjustment, and Proper weight-bearing schedule/adjustment  PATIENT EDUCATION: Education details: Continue HEP, progress on BERG.  Discussed having security guard or staff member bring RW downstairs so he can practice standing and walking as able during the day. Person educated: Patient Education method: Explanation, Demonstration, and Verbal cues Education comprehension: verbalized understanding and needs further education  HOME EXERCISE PROGRAM: Amputee sink HEP Do each exercise 1-2  times per day Do each exercise 5-10 repetitions Hold each exercise for 2 seconds to feel your location [place] out item 3 due to limited availability in shelter.  AT [place] AND PLACE FEET EQUAL DISTANCE FROM THE MIDLINE.  Try to find this position when standing still for activities.   USE TAPE ON FLOOR TO MARK THE MIDLINE POSITION which is even with middle of sink.  You also should try to feel with your limb pressure in socket.  You are trying to feel with limb what you  used to feel with the bottom of your foot.  Side to Side Shift: Moving your hips only (not shoulders): move weight onto your left leg, HOLD/FEEL pressure in socket.  Move back to equal weight on each leg, HOLD/FEEL pressure in socket. Move weight onto your right leg, HOLD/FEEL pressure in socket. Move back to equal weight on each leg, HOLD/FEEL pressure in socket. Repeat.  Start with both hands on sink, progress to hand on prosthetic side only, then no hands.  Front to Back Shift: Moving your hips only (not shoulders): move your weight forward onto your toes, HOLD/FEEL pressure in socket. Move your weight back to equal Flat Foot on both legs, HOLD/FEEL  pressure in socket. Move your weight back onto your heels, HOLD/FEEL  pressure in socket. Move your weight back to equal on both legs, HOLD/FEEL  pressure in socket. Repeat.  Start with both hands on sink, progress to hand on prosthetic side only, then no hands.  Overhead/Upward Reaching: alternated reaching up to top cabinets or ceiling if no cabinets present. Keep equal weight on each leg. Start with one hand support on counter while other hand reaches and progress to no hand support with reaching.  ace one hand in middle of sink and reach with other hand. Do both arms.  Then hover one hand and move cups with other hand.  5.   Looking Over Shoulders: With equal weight on each leg: alternate turning to look over your shoulders with one hand support on counter as needed.  Start with head motions only to look in front of shoulder, then even with shoulder and progress to looking behind you. To look to side, move head /eyes, then shoulder on side looking pulls back, shift more weight to side looking and pull hip back. Place one hand in middle of sink and let go with other hand so your shoulder can pull back. Switch hands to look other way.   Then hover one hand and look over  shoulder. If looking right, use left hand at sink. If looking left, use right hand at  sink.   Access Code: [email or web address] URL: [email or web address] Date: 12/21/2022 Prepared by: Elease Etienne  Exercises - Sit to Stand with Armchair  - 1 x daily - 7 x weekly - 2 sets - 10 reps - Seated Short Arc Quad  - 1 x daily - 7 x weekly - 2 sets - 10 reps - 3 seconds hold - Seated Hamstring Stretch  - 1 x daily - 7 x weekly - 1 sets - 3 reps - 30-45 seconds hold  ASSESSMENT:  CLINICAL IMPRESSION: Pt requires frequent redirection to tasks today for both assessment and safety.  Ambulatory warmup performed prior to assessment of BERG w/ patient scoring 30/56 today which is a 6 point improvement from baseline.  He has ongoing bilateral knee flexion and forward trunk lean during ambulation and upright activity so exercises provided today to address this and make HEP compliance easier in current living situation.  He continues to benefit from skilled PT to address ongoing deficits and promote safe functional independence for quality of life.  OBJECTIVE IMPAIRMENTS: Abnormal gait, decreased activity tolerance, decreased balance, decreased knowledge of use of DME, decreased mobility, decreased ROM, decreased strength, impaired perceived functional ability, impaired flexibility, impaired UE functional use, postural dysfunction, and prosthetic dependency .   ACTIVITY LIMITATIONS: carrying, lifting, bending, standing, squatting, stairs, transfers, bathing, and locomotion level  PARTICIPATION LIMITATIONS: meal prep, cleaning, laundry, driving, shopping, and community activity  PERSONAL FACTORS: Past/current experiences and 3+ comorbidities: see above  are also affecting patient's functional outcome.   REHAB POTENTIAL: Good  CLINICAL DECISION MAKING: Evolving/moderate complexity  EVALUATION COMPLEXITY: Moderate   GOALS: Goals reviewed with patient? Yes  SHORT TERM GOALS: Target date: 12/15/22  Pt will be IND with initial HEP in order to indicate improved functional mobility  and dec fall risk. Baseline: None given at this time; patient reports that he doesn't have a place he can complete HEP despite modification 12/13/2022 Goal status: NOT MET  2.  Pt will improve gait speed to >/=1.8 ft/sec w/ RW in order to indicate dec fall risk.  Baseline: 1.13 ft/sec with RW; 1.97 ft/sec with RW Goal status: MET  3.  Pt will ambulate 150' with RW at S level in order to indicate more independent household ambulation.   Baseline: 23' with RW at min A; 180' with RW + CGA-SBA Goal status: IN PROGRESS  4.  Pt will improve TUG to </=38 secs w/ RW at S level in order to indicate dec fall risk   Baseline: 43.69 secs with RW and min A; 30.34" with RW + SBA Goal status: MET  5.  Pt will report wearing B prostheses 60% of awake hours, donning and doffing independently without skin issues.   Baseline: only wearing 30 mins a few times a day, needs some assist to don appropriately; 40% of the time wearing prosthetic Goal status: IN PROGRESS  6.  Will assess BERG balance test and improve score by 4 points from baseline in order to indicate dec fall risk.  Baseline: 24/56 (1/26); 30/56 (2/16) Goal status: MET  LONG TERM GOALS: Target date: 01/13/23  Pt will be IND with final HEP in order to indicate improved functional mobility and dec fall risk. Baseline: none initiated at this time  Goal status: INITIAL  2.  Pt will improve gait speed to >/=2.62 ft/sec w/ RW in order to indicate dec fall risk.  Baseline: 1.13  ft/sec with RW and min A; 1.97 ft/sec with RW  Goal status: IN PROGRESS  3.  Pt will negotiate up/down 4 steps with B rail, up/down ramp and curb and ambulate x 500' outdoors over unlevel paved surfaces with RW at S level in order to indicate improved community mobility.   Baseline: Not assessed due to time constraint Goal status: INITIAL  4.  Pt will improve TUG to </=30 secs w/ LRAD at S level in order to indicate dec fall risk   Baseline: 43.69 secs with RW and  min A; 30.34" with RW + SBA Goal status: IN PROGRESS  5.  Pt will improve BERG balance score by 8 points from baseline in order to indicate dec fall risk.  Baseline: 24/56 (1/26) Goal status: INITIAL  6.  Will initiate gait training with quad tip cane/forearm crutch when appropriate and update goal.   Baseline: not assessed at this time  Goal status: INITIAL   PLAN:  PT FREQUENCY: 2x/week  PT DURATION: 8 weeks  PLANNED INTERVENTIONS: Therapeutic exercises, Therapeutic activity, Neuromuscular re-education, Balance training, Gait training, Patient/Family education, Self Care, Stair training, Vestibular training, Prosthetic training, DME instructions, and Manual therapy  PLAN FOR NEXT SESSION: further additions to HEP for static unsupported and dynamic balance and quad strength!   add to HEP for standing balance, hamstring stretching/hip flex stretch-add high level strength and balance to HEP (pt lives in shelter so space is limited), prosthetic education, gait with RW, obstacles, stairs/ramp/curb, endurance  Elease Etienne, PT, DPT [facility] [address] [place], [place], [address] Phone: [phone]   Fax:  [phone] 12/21/22, 12:01 PM    Check all possible CPT codes: 97110- Therapeutic Exercise, [id number]- Neuro Re-education, [phone] - Gait Training, [phone] - Manual Therapy, 97530 - Therapeutic Activities, [phone] - [place], and [phone] - Prosthetic training    Check all conditions that are expected to impact treatment: Diabetes mellitus   If treatment provided at initial evaluation, no treatment charged due to lack of authorization.

## 2022-12-24 ENCOUNTER — Telehealth: Payer: Self-pay

## 2022-12-24 ENCOUNTER — Other Ambulatory Visit: Payer: Self-pay

## 2022-12-24 NOTE — Telephone Encounter (Signed)
The following message was received from Libby :   faxed forms to MD for signature on 12/21/2022,  once received she will send to patient's insurance company for approval.

## 2022-12-25 NOTE — Telephone Encounter (Signed)
Thank you Opal Sidles

## 2022-12-26 ENCOUNTER — Ambulatory Visit: Payer: Medicaid Other | Admitting: Rehabilitation

## 2022-12-26 ENCOUNTER — Encounter: Payer: Self-pay | Admitting: Rehabilitation

## 2022-12-26 DIAGNOSIS — R2689 Other abnormalities of gait and mobility: Secondary | ICD-10-CM

## 2022-12-26 DIAGNOSIS — R2681 Unsteadiness on feet: Secondary | ICD-10-CM

## 2022-12-26 DIAGNOSIS — M6281 Muscle weakness (generalized): Secondary | ICD-10-CM

## 2022-12-26 DIAGNOSIS — R293 Abnormal posture: Secondary | ICD-10-CM

## 2022-12-26 NOTE — Therapy (Signed)
OUTPATIENT PHYSICAL THERAPY PROSTHETICS TREATMENT   Patient Name: Scott Greer MRN: [id number] DOB:04-30-1956, 67 y.o., male Today's Date: 12/26/2022  PCP: Karle Plumber, MD REFERRING PROVIDER: Karle Plumber, MD   END OF SESSION:  PT End of Session - 12/26/22 1236     Visit Number 8    Number of Visits 17    Date for PT Re-Evaluation 01/13/23    Authorization Type [employer] (27 v limit)    Authorization - Number of Visits 24    PT Start Time [id number]    PT Stop Time 1315    PT Time Calculation (min) 44 min    Equipment Utilized During Treatment Gait belt    Activity Tolerance Patient tolerated treatment well    Behavior During Therapy WFL for tasks assessed/performed              Past Medical History:  Diagnosis Date   CHF (congestive heart failure) ([place])    Dehiscence of amputation stump ([place])    left great toe   Diabetes mellitus without complication ([place]) [id number]   Type II   Glaucoma 2015   Hyperlipidemia    Hypertension 2005   Left foot infection 02/07/2021   Osteomyelitis ([place])    Wears glasses    Past Surgical History:  Procedure Laterality Date   AMPUTATION Left 06/05/2019   Procedure: LEFT GREAT TOE AMPUTATION;  Surgeon: Newt Minion, MD;  Location: [facility];  Service: Orthopedics;  Laterality: Left;   AMPUTATION Left 07/10/2019   Procedure: LEFT FOOT 1ST RAY AMPUTATION;  Surgeon: Newt Minion, MD;  Location: [facility];  Service: Orthopedics;  Laterality: Left;   AMPUTATION Right 05/25/2020   Procedure: RIGHT GREAT TOE AMPUTATION;  Surgeon: Newt Minion, MD;  Location: [facility];  Service: Orthopedics;  Laterality: Right;   AMPUTATION Left 01/25/2021   Procedure: LEFT 2ND TOE AMPUTATION;  Surgeon: Newt Minion, MD;  Location: [facility];  Service: Orthopedics;  Laterality: Left;   AMPUTATION Left 02/08/2021   Procedure: LEFT TRANSMETATARSAL AMPUTATION;  Surgeon: Newt Minion, MD;  Location: [facility];  Service: Orthopedics;  Laterality: Left;   AMPUTATION  Left 02/10/2021   Procedure: AMPUTATION BELOW KNEE;  Surgeon: Newt Minion, MD;  Location: [facility];  Service: Orthopedics;  Laterality: Left;   AMPUTATION Right 04/14/2021   Procedure: RIGHT 1ST AND 2ND RAY AMPUTATION;  Surgeon: Newt Minion, MD;  Location: [facility];  Service: Orthopedics;  Laterality: Right;   AMPUTATION Right 04/26/2021   Procedure: RIGHT BELOW KNEE AMPUTATION;  Surgeon: Newt Minion, MD;  Location: [facility];  Service: Orthopedics;  Laterality: Right;   NO PAST SURGERIES     RIGHT/LEFT HEART CATH AND CORONARY ANGIOGRAPHY N/A 01/02/2021   Procedure: RIGHT/LEFT HEART CATH AND CORONARY ANGIOGRAPHY;  Surgeon: Lorretta Harp, MD;  Location: [facility];  Service: Cardiovascular;  Laterality: N/A;   Patient Active Problem List   Diagnosis Date Noted   Chronic combined systolic and diastolic CHF (congestive heart failure) ([place]) 12/01/2021   S/P BKA (below knee amputation) bilateral ([place]) 07/18/2021   Gangrene of right foot (HCC)    Foot pain, right 04/14/2021   Status post below-knee amputation ([place]) 02/17/2021   Wound dehiscence    Gangrene of toe of left foot (H[facility])    Cutaneous abscess of left foot    Left foot infection 02/07/2021   Leukocytosis 02/07/2021   Thrombocytosis 02/07/2021   Hyponatremia 02/07/2021   AKI (acute kidney injury) ([place])  02/07/2021   Hyperglycemia due to diabetes mellitus ([place]) 02/07/2021   New onset of congestive heart failure ([place]) 12/26/2020   CKD (chronic kidney disease) stage 3, GFR 30-59 ml/min (HCC) [id number]   Acute systolic CHF (congestive heart failure) ([place]) 12/26/2020   Anemia, chronic disease 06/22/2020   Amputee, great toe, right ([place]) 06/20/2020   Normocytic anemia 06/20/2020   Erectile dysfunction associated with type 2 diabetes mellitus ([place]) 06/20/2020   Positive for macroalbuminuria 10/24/2019   Amputation of left great toe ([place]) 10/23/2019   Tobacco dependence 10/23/2019   Diabetic foot infection ([place])    Noncompliance     Glaucoma    Hyperlipidemia    Essential hypertension 11/06/2003   Diabetes mellitus ([place]) 11/05/1997    ONSET DATE: 11/02/2022 (referral date) (He reports he got legs on 12/26)  REFERRING DIAG: Z89.512,Z89.511 (ICD-10-CM) - S/P BKA (below knee amputation) bilateral (HCC)  THERAPY DIAG:  Other abnormalities of gait and mobility  Muscle weakness (generalized)  Abnormal posture  Unsteadiness on feet  Rationale for Evaluation and Treatment: Rehabilitation  SUBJECTIVE:   SUBJECTIVE STATEMENT: Pt is using the urinal at nighttime, but has not been able to use it during the daytime.  States that he has been keeping walker downstairs with him and using it as he is able, but still sometimes uses w/c to get to bathroom.  Lengthy discussion with pt as he continues to take time to talk about issues at [facility] and with getting power w/c and difficulties he encounters in general.  Doesn't have legs donned today due to time constraint.   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  left BKA on 02/10/21 and R BKA on 04/26/21.   PAIN:  Are you having pain? No  VITALS  There were no vitals filed for this visit.     PRECAUTIONS: Fall  WEIGHT BEARING RESTRICTIONS: No  FALLS: Has patient fallen in last 6 months? Yes. Number of falls a few   LIVING ENVIRONMENT: Lives with: lives with their family and is homeless is living at [place] in: [place]: [place] layout: Two level Stairs: No Has following equipment at home: Wheelchair (manual)  PLOF: Independent with basic ADLs, Independent with community mobility with device, and Independent with homemaking with wheelchair  PATIENT GOALS: "I want to be able to get my own apartment.  I want to walk independently with these legs."  OBJECTIVE:   DIAGNOSTIC  FINDINGS: n/a  COGNITION: Overall cognitive status: Within functional limits for tasks assessed   SENSATION: WFL  MUSCLE LENGTH: Some tightness noted in B knees in standing, unable to achieve full knee extension, lacking approx 10 deg in standing.  POSTURE: rounded shoulders, forward head, and flexed trunk    LOWER EXTREMITY MMT:  MMT Right eval Left eval  Hip flexion 4/5 4/5  Hip extension    Hip abduction 4/5 4/5  Hip adduction 4/5 4/5  Hip internal rotation    Hip external rotation    Knee flexion 4/5 4/5  Knee extension 4/5 4/5  Ankle dorsiflexion    Ankle plantarflexion    Ankle inversion    Ankle eversion    (Blank rows = not tested) All tested in seated position.   TODAY'S TREATMENT:   Comes into clinic without legs donned today due to time constraint so spends a descent amount  of time in session donning legs.  Needs assist and cues for aligning pin for proper placement into socket.  He continues to perseverate on his social issues and issues at [place] [place].  PT did discuss that he needs to take responsibility for what he can.  For example, he reports that he doesn't get enough sleep and doesn't have enough time in the morning to get legs on before going downstairs.  Discussed that a very simple thing he can do is to begin going to bed 15-30 mins earlier in order to get up 15-30 mins earlier to ensure he can don legs as I feel he would be able to move better in his room with legs donned than moving in w/c.  His new manual w/c does seem quite small and not deep enough for his height.  Pt said they just dropped it off.    GAIT: Gait pattern: step through pattern, decreased step length- Right, decreased step length- Left, decreased stride length, scissoring, and trunk flexed Distance walked: 230', 115', 85' Assistive device utilized: Environmental consultant - 2 wheeled Level of assistance: SBA and CGA Comments: Cues for safe foot placement when making turns and for wider BOS throughout.   He is doing better with upright posture but still needs intermittent cuing.    NMR:  In // bars, side stepping with light UE support x 4 laps, standing with feet apart without UE support x 20 secs progressing to lateral weight shift with opposite UE reach x 20 reps.  Cues for slower motions to get better weight shift and maintain stability.  Forward walking in // bars with single UE support x 4 laps.  Pt did very well with this task!   CURRENT PROSTHETIC WEAR ASSESSMENT: Patient is independent with: skin check and residual limb care Patient is dependent with: prosthetic cleaning, ply sock cleaning, correct ply sock adjustment, proper wear schedule/adjustment, and proper weight-bearing schedule/adjustment Donning prosthesis: Complete Independence Doffing prosthesis: Complete Independence  Prosthetic wear tolerance: ONGOING:  Reports he is wearing for about 2 hours a day or more depending on when he can go upstairs in the shelter after dinner.  Continued to encourage consistency with wear time esp  since he has RW.  When he leaves PT he wears prosthetics remainder of day. Prosthetic weight bearing tolerance: 2-3 minutes Edema: none noted  Residual limb condition: Pts skin is in excellent condition, good hydration and hair growth.  Prosthetic description: pin lock suspension with flexible inner socket.  He does 1 ply over this and then 5 ply over silicone liner.   K code/activity level with prosthetic use: Level 3  PATIENT EDUCATED ON FOLLOWING PROSTHETIC CARE: Prosthetic wear tolerance: 3 hours, 2x/day, 7 days/week Prosthetic weight bearing tolerance: 3-5 minutes Other education  Correct ply sock adjustment, [place] donning, Proper doffing, Proper wear schedule/adjustment, and Proper weight-bearing schedule/adjustment  PATIENT EDUCATION: Education details: Continue HEP Person educated: Patient Education method: Consulting civil engineer, Demonstration, and Verbal cues Education comprehension: verbalized  understanding and needs further education  HOME EXERCISE PROGRAM: Amputee sink HEP Do each exercise 1-2  times per day Do each exercise 5-10 repetitions Hold each exercise for 2 seconds to feel your location Struck out item 3 due to limited availability in shelter.  AT [place] AND PLACE FEET EQUAL DISTANCE FROM THE MIDLINE.  Try to find this position when standing still for activities.   USE TAPE ON FLOOR TO MARK THE MIDLINE POSITION which is even with middle of sink.  You also should try to feel with your limb pressure in socket.  You are trying to feel with limb what you used to feel with the bottom of your foot.  Side to Side Shift: Moving your hips only (not shoulders): move weight onto your left leg, HOLD/FEEL pressure in socket.  Move back to equal weight on each leg, HOLD/FEEL pressure in socket. Move weight onto your right leg, HOLD/FEEL pressure in socket. Move back to equal weight on each leg, HOLD/FEEL pressure in socket. Repeat.  Start with both hands on sink, progress to hand on prosthetic side only, then no hands.  Front to Back Shift: Moving your hips only (not shoulders): move your weight forward onto your toes, HOLD/FEEL pressure in socket. Move your weight back to equal Flat Foot on both legs, HOLD/FEEL  pressure in socket. Move your weight back onto your heels, HOLD/FEEL  pressure in socket. Move your weight back to equal on both legs, HOLD/FEEL  pressure in socket. Repeat.  Start with both hands on sink, progress to hand on prosthetic side only, then no hands.  Overhead/Upward Reaching: alternated reaching up to top cabinets or ceiling if no cabinets present. Keep equal weight on each leg. Start with one hand support on counter while other hand reaches and progress to no hand support with reaching.  ace one hand in middle of sink and reach with other hand. Do both arms.  Then hover one hand and move cups with other hand.  5.   Looking Over Shoulders: With  equal weight on each leg: alternate turning to look over your shoulders with one hand support on counter as needed.  Start with head motions only to look in front of shoulder, then even with shoulder and progress to looking behind you. To look to side, move head /eyes, then shoulder on side looking pulls back, shift more weight to side looking and pull hip back. Place one hand in middle of sink and let go with other hand so your shoulder can pull back. Switch hands to look other way.   Then hover one hand and look over shoulder. If looking right, use left hand at sink. If looking left, use right hand at sink.   Access Code: [email or web address] URL: [email or web address] Date: 12/21/2022 Prepared by: Elease Etienne  Exercises - Sit to Stand with Armchair  - 1 x daily - 7 x weekly - 2 sets - 10 reps - Seated Short Arc Quad  - 1 x daily - 7 x weekly - 2 sets - 10 reps - 3 seconds hold - Seated Hamstring Stretch  - 1 x daily - 7 x weekly - 1 sets - 3 reps - 30-45 seconds hold  ASSESSMENT:  CLINICAL IMPRESSION: Pt requires frequent redirection to tasks today as in previous sessions. Remainder of session focused on gait with RW for quality and distance and balance in // bars decreasing amount of UE support.  Pt tolerates well.  Continue POC.   OBJECTIVE IMPAIRMENTS: Abnormal gait, decreased activity tolerance, decreased balance, decreased knowledge of use of DME, decreased mobility, decreased ROM, decreased strength, impaired perceived functional ability, impaired flexibility, impaired UE functional use, postural dysfunction, and prosthetic dependency .   ACTIVITY LIMITATIONS: carrying, lifting, bending, standing, squatting, stairs, transfers, bathing, and locomotion level  PARTICIPATION LIMITATIONS: meal prep, cleaning, laundry, driving, shopping, and community activity  PERSONAL FACTORS: Past/current experiences and 3+ comorbidities: see above  are also affecting patient's functional  outcome.   REHAB POTENTIAL: Good  CLINICAL DECISION  MAKING: Evolving/moderate complexity  EVALUATION COMPLEXITY: Moderate   GOALS: Goals reviewed with patient? Yes  SHORT TERM GOALS: Target date: 12/15/22  Pt will be IND with initial HEP in order to indicate improved functional mobility and dec fall risk. Baseline: None given at this time; patient reports that he doesn't have a place he can complete HEP despite modification 12/13/2022 Goal status: NOT MET  2.  Pt will improve gait speed to >/=1.8 ft/sec w/ RW in order to indicate dec fall risk.  Baseline: 1.13 ft/sec with RW; 1.97 ft/sec with RW Goal status: MET  3.  Pt will ambulate 150' with RW at S level in order to indicate more independent household ambulation.   Baseline: 18' with RW at min A; 180' with RW + CGA-SBA Goal status: IN PROGRESS  4.  Pt will improve TUG to </=38 secs w/ RW at S level in order to indicate dec fall risk   Baseline: 43.69 secs with RW and min A; 30.34" with RW + SBA Goal status: MET  5.  Pt will report wearing B prostheses 60% of awake hours, donning and doffing independently without skin issues.   Baseline: only wearing 30 mins a few times a day, needs some assist to don appropriately; 40% of the time wearing prosthetic Goal status: IN PROGRESS  6.  Will assess BERG balance test and improve score by 4 points from baseline in order to indicate dec fall risk.  Baseline: 24/56 (1/26); 30/56 (2/16) Goal status: MET  LONG TERM GOALS: Target date: 01/13/23  Pt will be IND with final HEP in order to indicate improved functional mobility and dec fall risk. Baseline: none initiated at this time  Goal status: INITIAL  2.  Pt will improve gait speed to >/=2.62 ft/sec w/ RW in order to indicate dec fall risk.  Baseline: 1.13 ft/sec with RW and min A; 1.97 ft/sec with RW  Goal status: IN PROGRESS  3.  Pt will negotiate up/down 4 steps with B rail, up/down ramp and curb and ambulate x 500' outdoors  over unlevel paved surfaces with RW at S level in order to indicate improved community mobility.   Baseline: Not assessed due to time constraint Goal status: INITIAL  4.  Pt will improve TUG to </=30 secs w/ LRAD at S level in order to indicate dec fall risk   Baseline: 43.69 secs with RW and min A; 30.34" with RW + SBA Goal status: IN PROGRESS  5.  Pt will improve BERG balance score by 8 points from baseline in order to indicate dec fall risk.  Baseline: 24/56 (1/26) Goal status: INITIAL  6.  Will initiate gait training with quad tip cane/forearm crutch when appropriate and update goal.   Baseline: not assessed at this time  Goal status: INITIAL   PLAN:  PT FREQUENCY: 2x/week  PT DURATION: 8 weeks  PLANNED INTERVENTIONS: Therapeutic exercises, Therapeutic activity, Neuromuscular re-education, Balance training, Gait training, Patient/Family education, Self Care, Stair training, Vestibular training, Prosthetic training, DME instructions, and Manual therapy  PLAN FOR NEXT SESSION: further additions to HEP for static unsupported and dynamic balance and quad strength!   add to HEP for standing balance, hamstring stretching/hip flex stretch-add high level strength and balance to HEP (pt lives in shelter so space is limited), prosthetic education, gait with RW, obstacles, stairs/ramp/curb, endurance  Cameron Sprang, PT, MPT [facility] [address] [place], [place], [address] Phone: [phone]   Fax:  [phone] 12/26/22, 3:50 PM  Check all possible CPT codes: 97110- Therapeutic Exercise, [phone]- Neuro Re-education, [phone] - Gait Training, [phone] - Manual Therapy, [phone] - Therapeutic Activities, [phone] - Self Care, and [phone] - Prosthetic training    Check all conditions that are expected to impact treatment: Diabetes mellitus   If treatment provided at initial evaluation, no treatment charged due to lack of authorization.

## 2022-12-27 ENCOUNTER — Telehealth (HOSPITAL_COMMUNITY): Payer: Self-pay | Admitting: Licensed Clinical Social Worker

## 2022-12-27 NOTE — Telephone Encounter (Signed)
CSW sent message about pt transportation to attending appts.  CSW attempted to call pt to discuss barriers and help set up another appt and Medicaid transport to get there- unable to reach pt- left VM requesting return call.  Jorge Ny, LCSW Clinical Social Worker [facility] Desk#: [phone] Cell#: [phone]

## 2022-12-28 ENCOUNTER — Ambulatory Visit: Payer: Medicaid Other | Admitting: Physical Therapy

## 2022-12-31 ENCOUNTER — Other Ambulatory Visit: Payer: Medicaid Other | Admitting: *Deleted

## 2022-12-31 ENCOUNTER — Encounter: Payer: Self-pay | Admitting: *Deleted

## 2022-12-31 NOTE — Patient Outreach (Signed)
Medicaid Managed Care   Nurse Care Manager Note  12/31/2022 Name:  Scott Greer MRN:  [id number] DOB:  18-Nov-1955  Scott Greer is an 67 y.o. year old male who is a primary patient of [name], MD.  The [facility] Managed Care Coordination team was consulted for assistance with:    DMII  Mr. [name] was given information about Medicaid Managed Care Coordination team services today. Scott Greer Patient agreed to services and verbal consent obtained.  Engaged with patient by telephone for follow up visit in response to provider referral for case management and/or care coordination services.   Assessments/Interventions:  Review of past medical history, allergies, medications, health status, including review of consultants reports, laboratory and other test data, was performed as part of comprehensive evaluation and provision of chronic care management services.  SDOH (Social Determinants of Health) assessments and interventions performed: SDOH Interventions    Flowsheet Row Patient Outreach Telephone from 11/28/2022 in [place] Patient Outreach Telephone from 11/20/2022 in [place] Patient Outreach Telephone from 06/26/2022 in [place] Coordination Patient Outreach Telephone from 04/16/2022 in [place] Patient Outreach Telephone from 03/06/2022 in [place] Patient Outreach Telephone from 02/27/2022 in [place] Coordination  SDOH Interventions        Food Insecurity Interventions -- Intervention Not Indicated -- -- -- --  Housing Interventions -- Other (Comment)  [Collaborated with Patent attorney, [facility] and BSW] Other (Comment)  [Patient is currently homeless. RNCM collaborated with [employer]. [name] will follow up this afternoon  after contacting [facility]. [name] with Social Services with APS is now involved] Intervention Not Indicated  [Patient working with [employer] and an agent [name] to find housing. Currently living in [place].] -- --  Transportation Interventions Intervention Not Indicated  [Patient using transportation provided by Medicaid] -- -- Other (Comment)  [Provided with [employer] transportation [phone] Other (Comment)  [Provided with [employer] [phone] --  Stress Interventions -- -- -- -- -- Other (Comment)  [Referral to [place]  Allergies  Allergen Reactions   Bee Venom Hives, Itching and Swelling    Medications Reviewed Today     Reviewed by [name], RN (Registered Nurse) on 12/31/22 at 1449  Med List Status: <None>   Medication Order Taking? Sig Documenting Provider Last Dose Status Informant  Accu-Chek Softclix Lancets lancets [id number] No Use to test blood sugars up to 4 times daily as directed.  Patient not taking: Reported on 10/25/2022   [name], MD Not Taking Active   amiodarone (PACERONE) 200 MG tablet [id number]  Take 1 tablet (200 mg total) by mouth daily. [name], MD  Active   aspirin (ASPIRIN LOW DOSE) 81 MG chewable tablet [id number] No CHEW ONE TABLET BY MOUTH ONCE DAILY Needs appointment for further refills Scott Donovan, PA-C Taking Active   atorvastatin (LIPITOR) 10 MG tablet [id number]  Take 1 tablet (10 mg total) by mouth every evening. [name], MD  Active   Blood Glucose Monitoring Suppl (ACCU-CHEK GUIDE) w/Device KIT [id number] No Use to test blood sugars up to 4 times daily as directed.  Patient not taking: Reported on 10/25/2022   [name], MD Not Taking Active   Blood Glucose Monitoring Suppl (TRUE METRIX METER) w/Device KIT [id number] No Use as directed  Patient not taking: Reported on 10/25/2022   [name], MD Not Taking Active Other   carvedilol (COREG) 3.125 MG tablet [id number]  Take 1 tablet (3.125 mg total) by mouth 2 (two) times daily with a meal. [name], MD  Active   Continuous Blood Gluc Receiver (FREESTYLE LIBRE 2 READER) DEVI [id number] No UAD  Patient not taking: Reported on 10/25/2022   [name], MD Not Taking Active   Continuous Blood Gluc Sensor ([place]) MISC [id number] No Change sensor Q 2 wks  Patient not taking: Reported on 10/25/2022   [name], MD Not Taking Active   digoxin (LANOXIN) 0.125 MG tablet [id number]  Take 1 tablet (0.125 mg total) by mouth every evening with supper. [name], MD  Active   empagliflozin (JARDIANCE) 10 MG TABS tablet [id number]  Take 1 tablet (10 mg total) by mouth daily at 12 noon. [name], MD  Active   ferrous sulfate (FEROSUL) 325 (65 FE) MG tablet [id number] No TAKE ONE TABLET BY MOUTH EVERY MORNING WITH BREAKFAST [name], MD Taking Active   gabapentin (NEURONTIN) 300 MG capsule [id number] No Take 1 capsule (300 mg total) by mouth 2 (two) times daily. [name], PA-C Taking Active   glimepiride (AMARYL) 1 MG tablet [id number]  Take 1 tablet (1 mg total) by mouth daily with breakfast. [name], MD  Active   glucose blood (ACCU-CHEK GUIDE) test strip [id number] No Use to test blood sugars up to 4 times daily as directed.  Patient not taking: Reported on 10/25/2022   [name], MD Not Taking Active   isosorbide-hydrALAZINE (BIDIL) 20-37.5 MG tablet [id number]  Take 1 tablet by mouth 2 (two) times daily. [name], MD  Active   nutrition supplement, [name]) PACK [id number] No Take 1 packet by mouth 2 (two) times daily between meals.  Patient not taking: Reported on 10/25/2022   Scott Greer, [name] Not Taking Active   sacubitril-valsartan (ENTRESTO) 24-26 MG [id number]  Take 1 tablet by mouth 2 (two) times daily. [name], MD  Active   spironolactone  (ALDACTONE) 25 MG tablet [id number]  Take 1 tablet (25 mg total) by mouth every morning. [name], MD  Active   torsemide (DEMADEX) 20 MG tablet [id number]  Take 1 tablet (20 mg total) by mouth daily. [name], MD  Active   zinc sulfate 220 (50 Zn) MG capsule [id number] No Take 1 capsule (220 mg total) by mouth daily.  Patient not taking: Reported on 10/25/2022   Scott Greer, [name] Not Taking Active             Patient Active Problem List   Diagnosis Date Noted   Chronic combined systolic and diastolic CHF (congestive heart failure) ([place]) 12/01/2021   S/P BKA (below knee amputation) bilateral ([place]) 07/18/2021   Gangrene of right foot ([place])    Foot pain, right 04/14/2021   Status post below-knee amputation ([place]) 02/17/2021   Wound dehiscence    Gangrene of toe of left foot (HCC)    Cutaneous abscess of left foot    Left foot infection 02/07/2021   Leukocytosis 02/07/2021   Thrombocytosis 02/07/2021   Hyponatremia 02/07/2021   AKI (acute kidney injury) ([place]) 02/07/2021   Hyperglycemia due to diabetes mellitus ([place]) 02/07/2021   New onset of congestive heart failure ([place]) 12/26/2020   CKD (chronic kidney disease) stage 3, GFR 30-59 ml/min ([place]) 12/26/2020  Acute systolic CHF (congestive heart failure) ([place]) 12/26/2020   Anemia, chronic disease 06/22/2020   Amputee, great toe, right ([place]) 06/20/2020   Normocytic anemia 06/20/2020   Erectile dysfunction associated with type 2 diabetes mellitus ([place]) 06/20/2020   Positive for macroalbuminuria 10/24/2019   Amputation of left great toe ([place]) 10/23/2019   Tobacco dependence 10/23/2019   Diabetic foot infection ([place])    Noncompliance    Glaucoma    Hyperlipidemia    Essential hypertension 11/06/2003   Diabetes mellitus ([place]) 11/05/1997    Conditions to be addressed/monitored per PCP order:  DMII  Care Plan : RN Care Manager Plan of Care  Updates made by [name], RN since 12/31/2022 12:00 AM      Problem: Health Management needs related to DMII      Long-Range Goal: Development of Plan of Care to address Health Management needs related to DMII   Start Date: 02/27/2022  Expected End Date: 02/01/2023  Priority: High  Note:   Current Barriers:  Care Coordination needs related to Housing barriers  Chronic Disease Management support and education needs related to DMII, Homelessness, and Stress  Mr. Sproles continues to stay at [facility]. He reports having to be out by February 03, 2023. He is attending PT to learn to use prosthetics. He has missed a few recent appointments due to issues with transportation. Intern with [employer] is helping Mr. Bartosch to apply for eligible housing. Mr. Dworak has not contacted [place] to follow up on housing.  RNCM Clinical Goal(s):  Patient will verbalize understanding of plan for management of DMII as evidenced by patient report continue to work with RN Care Manager and/or Social Worker to address care management and care coordination needs related to Homelessness and managing stress as evidenced by adherence to CM Team Scheduled appointments     through collaboration with RN Care manager, provider, and care team.   Interventions: Inter-disciplinary care team collaboration (see longitudinal plan of care) Evaluation of current treatment plan related to  self management and patient's adherence to plan as established by provider Advised patient to answer his phone Rescheduled missed telephone visit with BSW on 01/07/23 at 10:30am Provided therapeutic listening Provided patient with [facility] contact information [phone] to contact regarding wait list and his place line    Diabetes:  (Status: Goal on Track (progressing): YES.) Long Term Goal   Lab Results  Component Value Date   HGBA1C 7.3 (A) 12/21/2022   @ Assessed patient's understanding of A1c goal: <7% Discussed plans with patient for ongoing care management follow up and  provided patient with direct contact information for care management team;      Referral made to social work team for assistance with managing stress and procuring housing ;      Assessed social determinant of health barriers;        Unable to review medications, Patient is taking medications dispensed by RN at the [facility] upcoming appointment including: PT on Monday and Friday and  01/01/23 with Cardiology  Advised patient to wear prosthetics every day for a couple of hours a day Discussed the importance of notifying provider if he notices any blisters or sores Discussed cutting back on sweets Advised to reschedule missed eye exam  Patient Goals/Self-Care Activities: Attend all scheduled provider appointments Call provider office for new concerns or questions  Work with the social worker to address care coordination needs and will continue to work with the clinical team to address health  care and disease management related needs Follow up with housing resources       Follow Up:  Patient agrees to Care Plan and Follow-up.  Plan: The Managed Medicaid care management team will reach out to the patient again over the next 30 days.  Date/time of next scheduled RN care management/care coordination outreach:  01/30/23 @ 1:15pm  [name] RN, BSN [place]  [employer] manager

## 2022-12-31 NOTE — Patient Instructions (Signed)
Visit Information  Mr. Palos was given information about Medicaid Managed Care team care coordination services as a part of their [facility] Medicaid benefit. [name] verbally consented to engagement with the [facility] Managed Care team.   If you are experiencing a medical emergency, please call 911 or report to your local emergency department or urgent care.   If you have a non-emergency medical problem during routine business hours, please contact your provider's office and ask to speak with a nurse.   For questions related to your [facility] health plan, please call: [phone] or go here:[email or web address]  If you would like to schedule transportation through your [facility] plan, please call the following number at least 2 days in advance of your appointment: [phone].  You can also use the MTM portal or MTM mobile app to manage your rides. For the portal, please go to [email or web address].  Call the [place] at [phone], at any time, 24 hours a day, 7 days a week. If you are in danger or need immediate medical attention call 911.  If you would like help to quit smoking, call 1-800-QUIT-NOW (724) 142-8836) OR Espaol: 1-855-Djelo-Ya HD:1601594) o para ms informacin haga clic aqu or Text READY to 200-400 to register via text  Mr. Freerksen,   Please see education materials related to diabetes provided as print materials.   The patient verbalized understanding of instructions, educational materials, and care plan provided today and agreed to receive a mailed copy of patient instructions, educational materials, and care plan.   Telephone follow up appointment with Managed Medicaid care management team member scheduled for:01/30/23 @ 1:15pm  [name] RN, BSN [place] RN Care Coordinator   Following is a copy of your plan of care:  Care Plan : RN Care Manager Plan of Care  Updates made by  [name], RN since 12/31/2022 12:00 AM     Problem: Health Management needs related to DMII      Long-Range Goal: Development of Plan of Care to address Health Management needs related to DMII   Start Date: 02/27/2022  Expected End Date: 02/01/2023  Priority: High  Note:   Current Barriers:  Care Coordination needs related to Housing barriers  Chronic Disease Management support and education needs related to DMII, Homelessness, and Stress  Mr. Scarpinato continues to stay at [facility]. He reports having to be out by February 03, 2023. He is attending PT to learn to use prosthetics. He has missed a few recent appointments due to issues with transportation. Intern with [employer] is helping Mr. Bizon to apply for eligible housing. Mr. Riedinger has not contacted [place] to follow up on housing.  RNCM Clinical Goal(s):  Patient will verbalize understanding of plan for management of DMII as evidenced by patient report continue to work with RN Care Manager and/or Social Worker to address care management and care coordination needs related to Homelessness and managing stress as evidenced by adherence to CM Team Scheduled appointments     through collaboration with RN Care manager, provider, and care team.   Interventions: Inter-disciplinary care team collaboration (see longitudinal plan of care) Evaluation of current treatment plan related to  self management and patient's adherence to plan as established by provider Advised patient to answer his phone Rescheduled missed telephone visit with BSW on 01/07/23 at 10:30am Provided therapeutic listening Provided patient with [facility] contact information [phone] to contact regarding wait list and his place line  Diabetes:  (Status: Goal on Track (progressing): YES.) Long Term Goal   Lab Results  Component Value Date   HGBA1C 7.3 (A) 12/21/2022   @ Assessed patient's understanding of A1c goal: <7% Discussed plans with patient  for ongoing care management follow up and provided patient with direct contact information for care management team;      Referral made to social work team for assistance with managing stress and procuring housing ;      Assessed social determinant of health barriers;        Unable to review medications, Patient is taking medications dispensed by RN at the [facility] upcoming appointment including: PT on Monday and Friday and  01/01/23 with Cardiology  Advised patient to wear prosthetics every day for a couple of hours a day Discussed the importance of notifying provider if he notices any blisters or sores Discussed cutting back on sweets Advised to reschedule missed eye exam  Patient Goals/Self-Care Activities: Attend all scheduled provider appointments Call provider office for new concerns or questions  Work with the social worker to address care coordination needs and will continue to work with the clinical team to address health care and disease management related needs Follow up with housing resources

## 2023-01-01 ENCOUNTER — Encounter: Payer: Self-pay | Admitting: Cardiovascular Disease

## 2023-01-01 ENCOUNTER — Ambulatory Visit: Payer: Medicaid Other | Attending: Cardiovascular Disease | Admitting: Cardiovascular Disease

## 2023-01-01 VITALS — BP 126/66 | HR 58

## 2023-01-01 DIAGNOSIS — Z59812 Housing instability, housed, homelessness in past 12 months: Secondary | ICD-10-CM | POA: Diagnosis not present

## 2023-01-01 DIAGNOSIS — I5042 Chronic combined systolic (congestive) and diastolic (congestive) heart failure: Secondary | ICD-10-CM | POA: Diagnosis not present

## 2023-01-01 DIAGNOSIS — E782 Mixed hyperlipidemia: Secondary | ICD-10-CM | POA: Diagnosis not present

## 2023-01-01 NOTE — Progress Notes (Signed)
Cardiology Office Note:    Date:  01/01/2023   ID:  Scott Greer, DOB 11/30/1955, MRN [id number]  PCP:  Scott Pier, MD   [facility] Providers Cardiologist:  Scott Moores, MD    . Scott Greer   Referring MD: Scott Pier, MD   Chief Complaint  Patient presents with   Congestive Heart Failure          History of Present Illness:    Scott Greer is a 67 y.o. male with a hx of CHF, DM, HTN. I met him in the hospital Feb. 2022 EF 25-30%, mildly - moderately elevated pulmonary HTN He was last seen by Dr. 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   January 01, 2023: Scott Greer is seen today for follow-up of his severe LV dysfunction.  He has moderately elevated pulmonary hypertension.  Has been homeless, Is at the [facility] currently  Anticipates losing his spot in the [facility] at the end of March .  Is applying for apartments  Is able to get his meds [facility]   Breathing has been ok .  Will repeat his echo to see if his LV function has improved He needs to seen in the advanced CHF clinic He needs to various resources that are available through the Heart failure clinic       Past Medical History:  Diagnosis Date   CHF (congestive heart failure) ([place])    Dehiscence of amputation stump ([place])    left great toe   Diabetes mellitus without complication ([place]) [id number]   Type II   Glaucoma 2015   Hyperlipidemia    Hypertension 2005   Left foot infection 02/07/2021   Osteomyelitis ([place])    Wears glasses     Past Surgical History:  Procedure Laterality Date   AMPUTATION Left 06/05/2019   Procedure: LEFT GREAT TOE  AMPUTATION;  Surgeon: Scott Minion, MD;  Location: [place];  Service: Orthopedics;  Laterality: Left;   AMPUTATION Left 07/10/2019   Procedure: LEFT FOOT 1ST RAY AMPUTATION;  Surgeon: Scott Minion, MD;  Location: [place];  Service: Orthopedics;  Laterality: Left;   AMPUTATION Right 05/25/2020   Procedure: RIGHT GREAT TOE AMPUTATION;  Surgeon: Scott Minion, MD;  Location: [place];  Service: Orthopedics;  Laterality: Right;   AMPUTATION Left 01/25/2021   Procedure: LEFT 2ND TOE AMPUTATION;  Surgeon: Scott Minion, MD;  Location: [place];  Service: Orthopedics;  Laterality: Left;   AMPUTATION Left 02/08/2021   Procedure: LEFT TRANSMETATARSAL AMPUTATION;  Surgeon: Scott Minion, MD;  Location: [place];  Service: Orthopedics;  Laterality: Left;   AMPUTATION Left 02/10/2021   Procedure: AMPUTATION BELOW KNEE;  Surgeon: Scott Minion, MD;  Location: [place];  Service: Orthopedics;  Laterality: Left;   AMPUTATION Right 04/14/2021   Procedure: RIGHT 1ST AND 2ND RAY AMPUTATION;  Surgeon: Scott Minion, MD;  Location: [place];  Service: Orthopedics;  Laterality: Right;   AMPUTATION Right 04/26/2021   Procedure: RIGHT BELOW KNEE AMPUTATION;  Surgeon: Scott Greer,  Scott Regulus, MD;  Location: [place];  Service: Orthopedics;  Laterality: Right;   NO PAST SURGERIES     RIGHT/LEFT HEART CATH AND CORONARY ANGIOGRAPHY N/A 01/02/2021   Procedure: RIGHT/LEFT HEART CATH AND CORONARY ANGIOGRAPHY;  Surgeon: Scott Harp, MD;  Location: [place] [facility];  Service: Cardiovascular;  Laterality: N/A;    Current Medications: Current Meds  Medication Sig   amiodarone (PACERONE) 200 MG tablet Take 1 tablet (200 mg total) by mouth daily.   aspirin (ASPIRIN LOW DOSE) 81 MG chewable tablet CHEW ONE TABLET BY MOUTH ONCE DAILY Needs appointment for further refills   atorvastatin (LIPITOR) 10 MG tablet Take 1 tablet (10 mg total) by mouth every evening.   carvedilol (COREG) 3.125 MG tablet Take 1 tablet (3.125 mg total) by mouth 2 (two) times  daily with a meal.   digoxin (LANOXIN) 0.125 MG tablet Take 1 tablet (0.125 mg total) by mouth every evening with supper.   empagliflozin (JARDIANCE) 10 MG TABS tablet Take 1 tablet (10 mg total) by mouth daily at 12 noon.   ferrous sulfate (FEROSUL) 325 (65 FE) MG tablet TAKE ONE TABLET BY MOUTH EVERY MORNING WITH BREAKFAST   gabapentin (NEURONTIN) 300 MG capsule Take 1 capsule (300 mg total) by mouth 2 (two) times daily.   glimepiride (AMARYL) 1 MG tablet Take 1 tablet (1 mg total) by mouth daily with breakfast.   isosorbide-hydrALAZINE (BIDIL) 20-37.5 MG tablet Take 1 tablet by mouth 2 (two) times daily.   sacubitril-valsartan (ENTRESTO) 24-26 MG Take 1 tablet by mouth 2 (two) times daily.   spironolactone (ALDACTONE) 25 MG tablet Take 1 tablet (25 mg total) by mouth every morning.   torsemide (DEMADEX) 20 MG tablet Take 1 tablet (20 mg total) by mouth daily.     Allergies:   Bee venom   Social History   Socioeconomic History   Marital status: Divorced    Spouse name: Scott Greer   Number of children: 2   Years of education: Not on file   Highest education level: Associate degree: occupational, Hotel manager, or vocational program  Occupational History   Not on file  Tobacco Use   Smoking status: Every Day    Types: Cigars   Smokeless tobacco: Former    Types: Chew   Tobacco comments:    8 daily  Vaping Use   Vaping Use: Never used  Substance and Sexual Activity   Alcohol use: Yes    Alcohol/week: 11.0 standard drinks of alcohol    Types: 3 Cans of beer, 8 Shots of liquor per week    Comment: occasional   Drug use: Yes    Types: Marijuana    Comment: ocassional - last time 05/08/2020   Sexual activity: Not Currently  Other Topics Concern   Not on file  Social History Narrative   Out of prison for 2 months.   Lives at home with his wife.   Social Determinants of Health   Financial Resource Strain: High Risk (10/18/2021)   Overall Financial Resource Strain (CARDIA)     Difficulty of Paying Living Expenses: Very hard  Food Insecurity: Food Insecurity Present (11/20/2022)   Hunger Vital Sign    Worried About Running Out of Food in the Last Year: Sometimes true    Ran Out of Food in the Last Year: Sometimes true  Transportation Needs: Unmet Transportation Needs (11/28/2022)   PRAPARE - Hydrologist (Medical): Yes    Lack of Transportation (Non-Medical): Yes  Physical Activity: Inactive (12/11/2021)   Exercise Vital Sign    Days of Exercise per Week: 0 days    Minutes of Exercise per Session: 0 min  Stress: Stress Concern Present (02/27/2022)   [place]    Feeling of Stress : Very much  Social Connections: Socially Isolated (10/18/2021)   Social Connection and Isolation Panel [NHANES]    Frequency of Communication with Friends and Family: Three times a week    Frequency of Social Gatherings with Friends and Family: Twice a week    Attends Religious Services: Never    Marine scientist or Organizations: No    Attends Music therapist: Never    Marital Status: Divorced     Family History: The patient's family history includes Diabetes in his mother; Stroke in his mother.  ROS:   Please see the history of present illness.     All other systems reviewed and are negative.  EKGs/Labs/Other Studies Reviewed:    The following studies were reviewed today:   EKG:    Recent Labs: 10/15/2022: ALT 15; BUN 27; Creatinine, Ser 1.94; Hemoglobin 10.7; Platelets 216; Potassium 3.6; Sodium 136  Recent Lipid Panel    Component Value Date/Time   CHOL 157 08/14/2022 1458   TRIG 153 (H) 08/14/2022 1458   HDL 60 08/14/2022 1458   CHOLHDL 2.6 08/14/2022 1458   LDLCALC 71 08/14/2022 1458     Risk Assessment/Calculations:           Physical Exam:    Physical Exam: Blood pressure 126/66, pulse (!) 58, SpO2 99 %.      GEN:  middle age man,   bilateral BKAs ,  in no acute distress, examined in wheelchair  HEENT: Normal NECK: No JVD; No carotid bruits LYMPHATICS: No lymphadenopathy CARDIAC: RRR , no murmurs, rubs, gallops RESPIRATORY:  Clear to auscultation without rales, wheezing or rhonchi  ABDOMEN: Soft, non-tender, non-distended MUSCULOSKELETAL:  No edema; No deformity  SKIN: Warm and dry NEUROLOGIC:  Alert and oriented x 3   ASSESSMENT:    1. Chronic combined systolic and diastolic CHF (congestive heart failure) ([place])   2. Mixed hyperlipidemia   3. Housing instability, housed, homelessness in past 12 months     PLAN:       Chronic systolic congestive heart failure:   will repeat his echo to assess his LV function . I would like to refer him to the advanced heart failure clinic.  He anticipates becoming homeless again at the end of March.  He will be losing his spot in the [facility].  The heart failure clinic has access to different resources including various social workers who should be able to help him navigate his living situation, pharmacy requirements and others.             Medication Adjustments/Labs and Tests Ordered: Current medicines are reviewed at length with the patient today.  Concerns regarding medicines are outlined above.  Orders Placed This Encounter  Procedures   AMB referral to CHF clinic   Ambulatory referral to Social Work   ECHOCARDIOGRAM COMPLETE   No orders of the defined types were placed in this encounter.   Patient Instructions  Medication Instructions:  Your physician recommends that you continue on your current medications as directed. Please refer to the Current Medication list Greer to you today.  *If you need a refill on your cardiac medications before your next appointment, please call your pharmacy*  Lab Work: NONE If you have labs (blood work) drawn today and your tests are completely normal, you will receive your results only by: [place] (if you  have MyChart) OR A paper copy in the mail If you have any lab test that is abnormal or we need to change your treatment, we will call you to review the results.   Testing/Procedures: ECHO Your physician has requested that you have an echocardiogram. Echocardiography is a painless test that uses sound waves to create images of your heart. It provides your doctor with information about the size and shape of your heart and how well your heart's chambers and valves are working. This procedure takes approximately one hour. There are no restrictions for this procedure. Please do NOT wear cologne, perfume, aftershave, or lotions (deodorant is allowed). Please arrive 15 minutes prior to your appointment time.  Ambulatory Referral to HF clinic   Follow-Up: At [facility], you and your health needs are our priority.  As part of our continuing mission to provide you with exceptional heart care, we have created designated Provider Care Teams.  These Care Teams include your primary Cardiologist (physician) and Advanced Practice Providers (APPs -  Physician Assistants and Nurse Practitioners) who all work together to provide you with the care you need, when you need it.  Your next appointment:   6 months  Provider:   Mertie Moores, MD    Raquel Sarna, LCSW, CCSW-MCS Clinical Social Worker Lead [facility] [address] [place], [place] [address] Phone [phone] Cell [phone] Fax [phone] [email or web address]   Signed, Scott Moores, MD  01/01/2023 2:19 PM    [facility]

## 2023-01-01 NOTE — Patient Instructions (Addendum)
Medication Instructions:  Your physician recommends that you continue on your current medications as directed. Please refer to the Current Medication list given to you today.  *If you need a refill on your cardiac medications before your next appointment, please call your pharmacy*   Lab Work: NONE If you have labs (blood work) drawn today and your tests are completely normal, you will receive your results only by: [place] (if you have MyChart) OR A paper copy in the mail If you have any lab test that is abnormal or we need to change your treatment, we will call you to review the results.   Testing/Procedures: ECHO Your physician has requested that you have an echocardiogram. Echocardiography is a painless test that uses sound waves to create images of your heart. It provides your doctor with information about the size and shape of your heart and how well your heart's chambers and valves are working. This procedure takes approximately one hour. There are no restrictions for this procedure. Please do NOT wear cologne, perfume, aftershave, or lotions (deodorant is allowed). Please arrive 15 minutes prior to your appointment time.  Ambulatory Referral to HF clinic   Follow-Up: At [facility], you and your health needs are our priority.  As part of our continuing mission to provide you with exceptional heart care, we have created designated Provider Care Teams.  These Care Teams include your primary Cardiologist (physician) and Advanced Practice Providers (APPs -  Physician Assistants and Nurse Practitioners) who all work together to provide you with the care you need, when you need it.  Your next appointment:   6 months  Provider:   Mertie Moores, MD    Raquel Sarna, LCSW, CCSW-MCS Clinical Social Worker Lead [facility] [address] [place], [place] [address] Phone [phone] Cell [phone] Fax [phone] [email or web address]

## 2023-01-02 ENCOUNTER — Encounter: Payer: Self-pay | Admitting: Rehabilitation

## 2023-01-02 ENCOUNTER — Ambulatory Visit: Payer: Medicaid Other | Admitting: Rehabilitation

## 2023-01-02 ENCOUNTER — Telehealth: Payer: Self-pay | Admitting: Licensed Clinical Social Worker

## 2023-01-02 DIAGNOSIS — R293 Abnormal posture: Secondary | ICD-10-CM

## 2023-01-02 DIAGNOSIS — R2681 Unsteadiness on feet: Secondary | ICD-10-CM

## 2023-01-02 DIAGNOSIS — R2689 Other abnormalities of gait and mobility: Secondary | ICD-10-CM | POA: Diagnosis not present

## 2023-01-02 DIAGNOSIS — M6281 Muscle weakness (generalized): Secondary | ICD-10-CM
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
## 2023-01-02 NOTE — Telephone Encounter (Signed)
H&V Care Navigation CSW Progress Note  Clinical Social Worker received referral from care team at [facility] regarding pt current housing situation. Chart review completed, note multiple engagements with HF LCSW Jenna since 2022. Pt also active with [facility] RN program, [facility] Bettina Gavia clinic at [facility], [place] team and [place] at [facility]. Pt currently residing in [facility], per notes from [place], pt has been working with [employer] for housing options. Also has been connected various times with [facility] and has been encouraged to f/u with them multiple times. Pt has [facility] Medicaid benefits which include some case management and transportation to appts. I will route this to team should there be any additional assistance our team can provide.    Patient is participating in a Managed Medicaid Plan:  Yes- [place]: Food Insecurity Present (11/20/2022)  Housing: High Risk (11/20/2022)  Transportation Needs: Unmet Transportation Needs (11/28/2022)  Alcohol Screen: Low Risk  (10/18/2021)  Depression (PHQ2-9): Medium Risk (12/14/2020)  Financial Resource Strain: High Risk (10/18/2021)  Physical Activity: Inactive (12/11/2021)  Social Connections: Socially Isolated (10/18/2021)  Stress: Stress Concern Present (02/27/2022)  Tobacco Use: High Risk (01/01/2023)   Westley Hummer, MSW, [place]  [phone]- work cell phone (preferred) [phone]- desk phone

## 2023-01-03 ENCOUNTER — Encounter: Payer: Self-pay | Admitting: Critical Care Medicine

## 2023-01-03 ENCOUNTER — Ambulatory Visit: Payer: Self-pay

## 2023-01-03 ENCOUNTER — Other Ambulatory Visit: Payer: Self-pay

## 2023-01-03 ENCOUNTER — Telehealth: Payer: Self-pay

## 2023-01-03 DIAGNOSIS — Z7689 Persons encountering health services in other specified circumstances: Secondary | ICD-10-CM | POA: Insufficient documentation

## 2023-01-03 NOTE — Progress Notes (Addendum)
Established Patient Office Visit  Subjective   Patient ID: Scott Greer, male    DOB: April 23, 1956  Age: 67 y.o. MRN: [id number]  Cc mobility assessment  This is a 67 year old male who is a double amputee with history of hypertension and heart failure type 2 diabetes hyperglycemia associated and chronic kidney disease and tobacco use.  The patient had gangrene of both feet and subsequently had to undergo bilateral below-knee amputations.  This is a face-to-face visit to assess for a power wheelchair.  This is a mobility assessment.  The patient's height is 6 foot 3 and he is 240 pounds  The patient currently resides in a homeless shelter.  He is wheelchair-bound in a manual wheelchair.  He is able to transfer from bed to chair and has enough upper body strength that he can actually manipulate short distances.  He is being fitted for a prosthesis but remains very weak with this and has to go long distances due to his homelessness.  The patient has 5/5 strength in the upper extremities however he has 3-4/5 weakness in his lower extremities with his bilateral BKA's.  Even with the prosthesis which she has been trying to use he still has quite severe weakness and high risk of falling.  The power chair will assist him in cooking cleaning and toileting once he is able to achieve housing.  In addition he will have a safer ability to navigate the bus system and go long distances along the sidewalks as with the prosthetic alone he will not be able to do this.  In addition he has severe calluses on his hands from pushing the manual wheelchair throughout the city and his homeless state.  Canes and walkers are not sufficient and the manual wheelchair that has been living in for the past 2 years has created severe difficulties with sores and calluses on both hands.  The patient does have decreased range of motion of both lower extremities.  And he has weakness in the lower extremities even with the  prosthetics is not strong enough to stand on the prosthesis for long periods of time without a cane.  The patient cannot operate a tiller type steering scooter because of his body size and because of his inability to transfer on and off the scooter.  The patient does have active mental capacity to operate a power chair safely and willingness to use this inside the apartment or home that he may eventually achieve that he can also use it currently in the homeless shelter because there is a safe storage area and a elevator to which she can get upstairs        Review of Systems  Constitutional:  Positive for malaise/fatigue. Negative for chills, diaphoresis, fever and weight loss.  HENT:  Negative for congestion, hearing loss, nosebleeds, sore throat and tinnitus.   Eyes:  Negative for blurred vision, photophobia and redness.  Respiratory:  Positive for shortness of breath. Negative for cough, hemoptysis, sputum production, wheezing and stridor.   Cardiovascular:  Negative for chest pain, palpitations, orthopnea, claudication, leg swelling and PND.  Gastrointestinal:  Negative for abdominal pain, blood in stool, constipation, diarrhea, heartburn, nausea and vomiting.  Genitourinary:  Negative for dysuria, flank pain, frequency, hematuria and urgency.  Musculoskeletal:  Positive for back pain, falls and joint pain. Negative for myalgias and neck pain.  Skin:  Negative for itching and rash.  Neurological:  Positive for weakness. Negative for dizziness, tingling, tremors, sensory change, speech change, focal  weakness, seizures, loss of consciousness and headaches.  Endo/Heme/Allergies:  Negative for environmental allergies and polydipsia. Does not bruise/bleed easily.  Psychiatric/Behavioral:  Negative for depression, memory loss, substance abuse and suicidal ideas. The patient is not nervous/anxious and does not have insomnia.       Objective:     There were no vitals taken for this  visit.   Physical Exam Vitals reviewed.  Constitutional:      Appearance: Normal appearance. He is well-developed. He is not diaphoretic.  HENT:     Head: Normocephalic and atraumatic.     Nose: No nasal deformity, septal deviation, mucosal edema or rhinorrhea.     Right Sinus: No maxillary sinus tenderness or frontal sinus tenderness.     Left Sinus: No maxillary sinus tenderness or frontal sinus tenderness.     Mouth/Throat:     Pharynx: No oropharyngeal exudate.  Eyes:     General: No scleral icterus.    Conjunctiva/sclera: Conjunctivae normal.     Pupils: Pupils are equal, round, and reactive to light.  Neck:     Thyroid: No thyromegaly.     Vascular: No carotid bruit or JVD.     Trachea: Trachea normal. No tracheal tenderness or tracheal deviation.  Cardiovascular:     Rate and Rhythm: Normal rate and regular rhythm.     Chest Wall: PMI is not displaced.     Pulses: Normal pulses. No decreased pulses.     Heart sounds: Normal heart sounds, S1 normal and S2 normal. Heart sounds not distant. No murmur heard.    No systolic murmur is present.     No diastolic murmur is present.     No friction rub. No gallop. No S3 or S4 sounds.  Pulmonary:     Effort: No tachypnea, accessory muscle usage or respiratory distress.     Breath sounds: No stridor. No decreased breath sounds, wheezing, rhonchi or rales.  Chest:     Chest wall: No tenderness.  Abdominal:     General: Bowel sounds are normal. There is no distension.     Palpations: Abdomen is soft. Abdomen is not rigid.     Tenderness: There is no abdominal tenderness. There is no guarding or rebound.  Musculoskeletal:        General: Normal range of motion.     Cervical back: Normal range of motion and neck supple. No edema, erythema or rigidity. No muscular tenderness. Normal range of motion.     Comments: Bilateral BKA's  Lymphadenopathy:     Head:     Right side of head: No submental or submandibular adenopathy.     Left  side of head: No submental or submandibular adenopathy.     Cervical: No cervical adenopathy.  Skin:    General: Skin is warm and dry.     Coloration: Skin is not pale.     Findings: No rash.     Nails: There is no clubbing.  Neurological:     Mental Status: He is alert and oriented to person, place, and time.     GCS: GCS eye subscore is 4. GCS verbal subscore is 5. GCS motor subscore is 6.     Cranial Nerves: Cranial nerves 2-12 are intact.     Sensory: No sensory deficit.     Motor: Weakness and atrophy present.     Coordination: Coordination abnormal.     Gait: Gait abnormal.  Psychiatric:        Speech: Speech normal.  Behavior: Behavior normal.      No results found for any visits on 01/03/23.    The ASCVD Risk score (Arnett DK, et al., 2019) failed to calculate for the following reasons:   The patient has a prior MI or stroke diagnosis    Assessment & Plan:   Problem List Items Addressed This Visit       Other   Encounter for power mobility device assessment - Primary    It is my opinion this patient qualifies for a power wheelchair due to decreased strength in the lower extremities high risk for falls and inability to continue to operate the manual wheelchair because of severe calluses and pain in the hands  See documentation as above  I have read the PT evaluation and concur with the PT evaluation.          Asencion Noble, MD

## 2023-01-03 NOTE — Telephone Encounter (Signed)
Additional Notes: Vicky, RN with [employer] Nurse called to go over the patient's current medications, when they were sent to the pharmacy, who prescribed, which pharmacy. Advised of the current medication list and information requested. She asked if the cardiologist wanted him to continue on aspirin and amiodarone, advised she will need to call their office, but the list is current and Dr. Wynetta Emery prescribed those. She verbalized understanding and says they will follow up with the patient and pharmacies about his medications as well.    Reason for Disposition  Health Information question, no triage required and triager able to answer question  Answer Assessment - Initial Assessment Questions 1. REASON FOR CALL or QUESTION: "What is your reason for calling today?" or "How can I best help you?" or "What question do you have that I can help answer?"    Current medication list  Protocols used: Information Only Call - No Triage-A-AH

## 2023-01-03 NOTE — Assessment & Plan Note (Addendum)
It is my opinion this patient qualifies for a power wheelchair due to decreased strength in the lower extremities high risk for falls and inability to continue to operate the manual wheelchair because of severe calluses and pain in the hands  See documentation as above  I have read the PT evaluation and concur with the PT evaluation.

## 2023-01-03 NOTE — Telephone Encounter (Signed)
Standard written order, signed PT seating eval and face to face encounter for mobility exam faxed to [facility] attn: Andria Rhein

## 2023-01-04 ENCOUNTER — Ambulatory Visit: Payer: Medicaid Other | Attending: Orthopedic Surgery | Admitting: Physical Therapy

## 2023-01-04 ENCOUNTER — Encounter: Payer: Self-pay | Admitting: Physical Therapy

## 2023-01-04 ENCOUNTER — Other Ambulatory Visit: Payer: Self-pay

## 2023-01-04 VITALS — BP 145/75

## 2023-01-04 DIAGNOSIS — Z89512 Acquired absence of left leg below knee: Secondary | ICD-10-CM | POA: Diagnosis present

## 2023-01-04 DIAGNOSIS — R2689 Other abnormalities of gait and mobility: Secondary | ICD-10-CM | POA: Insufficient documentation

## 2023-01-04 DIAGNOSIS — Z89511 Acquired absence of right leg below knee: Secondary | ICD-10-CM | POA: Insufficient documentation

## 2023-01-04 DIAGNOSIS — R293 Abnormal posture: Secondary | ICD-10-CM | POA: Diagnosis present

## 2023-01-04 DIAGNOSIS — R2681 Unsteadiness on feet: Secondary | ICD-10-CM | POA: Insufficient documentation

## 2023-01-04 DIAGNOSIS — M6281 Muscle weakness (generalized): Secondary | ICD-10-CM | POA: Insufficient documentation
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
## 2023-01-07 ENCOUNTER — Other Ambulatory Visit: Payer: Medicaid Other

## 2023-01-07 NOTE — Patient Outreach (Signed)
  Medicaid Managed Care   Unsuccessful Outreach Note  01/07/2023 Name: Scott Greer MRN: [id number] DOB: June 16, 1956  Referred by: [name], MD Reason for referral : High Risk Managed Medicaid (MM Social work unsuccessful telephone outreach )   An unsuccessful telephone outreach was attempted today. The patient was referred to the case management team for assistance with care management and care coordination.   Follow Up Plan: A HIPAA compliant phone message was left for the patient providing contact information and requesting a return call.   Mickel Fuchs, BSW, [place] Managed Medicaid Team  [phone]

## 2023-01-07 NOTE — Patient Instructions (Signed)
  Medicaid Managed Care   Unsuccessful Outreach Note  01/07/2023 Name: Scott Greer MRN: [id number] DOB: 1956/08/31  Referred by: [name], MD Reason for referral : High Risk Managed Medicaid (MM Social work unsuccessful telephone outreach )   An unsuccessful telephone outreach was attempted today. The patient was referred to the case management team for assistance with care management and care coordination.   Follow Up Plan: A HIPAA compliant phone message was left for the patient providing contact information and requesting a return call.   Mickel Fuchs, BSW, [place] Managed Medicaid Team  [phone]

## 2023-01-08 ENCOUNTER — Other Ambulatory Visit: Payer: Self-pay

## 2023-01-08 ENCOUNTER — Other Ambulatory Visit: Payer: Self-pay | Admitting: Internal Medicine

## 2023-01-08 DIAGNOSIS — Z760 Encounter for issue of repeat prescription: Secondary | ICD-10-CM

## 2023-01-08 NOTE — Congregational Nurse Program (Signed)
  Dept: [phone]   Congregational Nurse Program Note  Date of Encounter: 01/03/2023  Clinic visit for blood glucose check and medication review.  Changing pharmacies d/t co-pay requirement, able to determine that prescriptions were filled at [facility].  [facility] case manager will arrange for medications to be picked up on Friday.  Blood glucose 83 before dinner, had taken Jardiance at lunch time as prescribed. Past Medical History: Past Medical History:  Diagnosis Date   CHF (congestive heart failure) ([place])    Dehiscence of amputation stump ([place])    left great toe   Diabetes mellitus without complication ([place]) [id number]   Type II   Glaucoma 2015   Hyperlipidemia    Hypertension 2005   Left foot infection 02/07/2021   Osteomyelitis ([place])    Wears glasses     Encounter Details:  CNP Questionnaire - 01/03/23 1510       Questionnaire   Ask client: Do you give verbal consent for me to treat you today? Yes    Student Assistance N/A    Location Patient [facility]    Visit Setting with Client Organization    Patient Status Unhoused    Insurance Medicaid    Insurance/Financial Assistance Referral N/A    Medication Have Medication Insecurities    Medical Provider Yes    Screening Referrals Made N/A    Medical Referrals Made N/A    Medical Appointment Made N/A    Recently w/o PCP, now 1st time PCP visit completed due to CNs referral or appointment made N/A    Food Have Food Insecurities    Transportation Need transportation assistance    Housing/Utilities No permanent housing    Interpersonal Safety Do not feel safe at current residence    Interventions Advocate/Support;Counsel;Educate;Case Management;Reviewed Medications    Abnormal to Normal Screening Since Last CN Visit N/A    Screenings CN Performed Blood Pressure;Blood Glucose;Pulse Ox    Sent Client to Lab for: N/A    Did client attend any of the following based off CNs referral or appointments made? N/A     ED Visit Averted N/A    Life-Saving Intervention Made N/A

## 2023-01-08 NOTE — Congregational Nurse Program (Signed)
  Dept: [phone]   Congregational Nurse Program Note  Date of Encounter: 01/08/2023  Clinic visit to assist with medication.  Reviewed medications list and educated him regarding reason for medication, when to take it and possible side effects.  Called pharmacy to determine if Gabapentin is to be refilled, pharmacy to contact Dr. Wynetta Emery to determine continuing it.  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 Yes    Student Assistance N/A    Location Patient [facility]    Visit Setting with Client Organization    Patient Status Unhoused    Insurance Medicaid    Insurance/Financial Assistance Referral N/A    Medication Have Medication Insecurities    Medical Provider Yes    Screening Referrals Made N/A    Medical Referrals Made N/A    Medical Appointment Made N/A    Recently w/o PCP, now 1st time PCP visit completed due to CNs referral or appointment made N/A    Food Have Food Insecurities    Transportation Need transportation assistance    Housing/Utilities No permanent housing    Interpersonal Safety Do not feel safe at current residence    Interventions Advocate/Support;Counsel;Educate;Case Management;Reviewed Medications;Spiritual Care    Abnormal to Normal Screening Since Last CN Visit N/A    Screenings CN Performed N/A    Sent Client to Lab for: N/A    Did client attend any of the following based off CNs referral or appointments made? N/A    ED Visit Averted N/A    Life-Saving Intervention Made N/A

## 2023-01-08 NOTE — Telephone Encounter (Signed)
Unable to refill per protocol, Rx request is too soon. Last refill 10/25/22 for 90 days and 1 refill.  Requested Prescriptions  Pending Prescriptions Disp Refills   gabapentin (NEURONTIN) 300 MG capsule 180 capsule 1    Sig: Take 1 capsule (300 mg total) by mouth 2 (two) times daily.     Neurology: Anticonvulsants - gabapentin Failed - 01/08/2023 11:39 AM      Failed - Cr in normal range and within 360 days    Creatinine, Ser  Date Value Ref Range Status  10/15/2022 1.94 (H) 0.61 - 1.24 mg/dL Final         Failed - Completed PHQ-2 or PHQ-9 in the last 360 days      Passed - Valid encounter within last 12 months    Recent Outpatient Visits           2 weeks ago Type 2 diabetes mellitus with diabetic nephropathy, without long-term current use of insulin ([place])   [place] Karle Plumber B, MD   2 months ago Type 2 diabetes mellitus with diabetic nephropathy, without long-term current use of insulin [facility])   [place], [place], [place]   4 months ago Type 2 diabetes mellitus with diabetic nephropathy, without long-term current use of insulin [facility])   [place] [name], MD   10 months ago [place] [name], MD   10 months ago No-show for appointment   [facility] [name], MD       Future Appointments             In 5 months [name], [name], MD [place] at [facility], [place]

## 2023-01-09 ENCOUNTER — Ambulatory Visit: Payer: Medicaid Other | Admitting: Physical Therapy

## 2023-01-10 DIAGNOSIS — E1121 Type 2 diabetes mellitus with diabetic nephropathy: Secondary | ICD-10-CM

## 2023-01-10 LAB — GLUCOSE, POCT (MANUAL RESULT ENTRY): POC Glucose: 107 mg/dl — AB (ref 70–99)

## 2023-01-10 NOTE — Congregational Nurse Program (Signed)
  Dept: [phone]   Congregational Nurse Program Note  Date of Encounter: 01/10/2023  Client visit today BP 105/60, Pulse 70 regular, Blood sugar 107 client reported he is taking his medications as prescribed. Client stated no dizziness, nausea, sweating.  Client looking for housing, provided with address and phone number for [facility] per client request. Missed physical therapy several times due to transportation problems with his [employer]. Client instructed to call Physical Therapist to determine a plan for client at home. Client reported he has instructions on paper.   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 Yes    Student Assistance N/A    Location Patient [facility]    Visit Setting with Client Organization    Patient Status Unhoused    Insurance Medicaid    Insurance/Financial Assistance Referral N/A    Medication Have Medication Insecurities    Medical Provider Yes    Screening Referrals Made N/A    Medical Referrals Made N/A    Medical Appointment Made N/A    Recently w/o PCP, now 1st time PCP visit completed due to CNs referral or appointment made N/A    Food Have Food Insecurities    Transportation Need transportation assistance    Housing/Utilities No permanent housing    Interpersonal Safety Do not feel safe at current residence    Interventions Advocate/Support;Counsel;Educate;Case Management;Reviewed Medications;Spiritual Care    Abnormal to Normal Screening Since  Last CN Visit Blood Glucose    Screenings CN Performed Blood Pressure;Blood Glucose;Pulse Ox    Sent Client to Lab for: N/A    Did client attend any of the following based off CNs referral or appointments made? N/A    ED Visit Averted N/A    Life-Saving Intervention Made N/A

## 2023-01-11 ENCOUNTER — Ambulatory Visit: Payer: Medicaid Other | Admitting: Physical Therapy

## 2023-01-14 ENCOUNTER — Other Ambulatory Visit: Payer: Self-pay

## 2023-01-15 ENCOUNTER — Other Ambulatory Visit: Payer: Self-pay | Admitting: Internal Medicine

## 2023-01-15 ENCOUNTER — Other Ambulatory Visit: Payer: Self-pay

## 2023-01-15 NOTE — Congregational Nurse Program (Signed)
  Dept: [phone]   Congregational Nurse Program Note  Date of Encounter: 01/15/2023  Clinic visit for blood pressure, BP 132/68, pulse 70 and regular. Reviewed all of medications, reason for medication, amount and time to take each medicine. 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 Yes    Student Assistance N/A    Location Patient [facility]    Visit Setting with Client Organization    Patient Status Unhoused    Insurance Medicaid    Insurance/Financial Assistance Referral N/A    Medication Have Medication Insecurities    Medical Provider Yes    Screening Referrals Made N/A    Medical Referrals Made N/A    Medical Appointment Made N/A    Recently w/o PCP, now 1st time PCP visit completed due to CNs referral or appointment made N/A    Food Have Food Insecurities    Transportation Need transportation assistance    Housing/Utilities No permanent housing    Interpersonal Safety Do not feel safe at current residence    Interventions Advocate/Support;Counsel;Educate;Case Management;Reviewed Medications    Abnormal to Normal Screening Since Last CN Visit N/A    Screenings CN Performed Blood Pressure;Pulse Ox    Sent Client to Lab for: N/A    Did client attend any of the following based off CNs referral or appointments made? N/A    ED Visit Averted N/A    Life-Saving Intervention Made N/A

## 2023-01-16 ENCOUNTER — Other Ambulatory Visit (HOSPITAL_COMMUNITY): Payer: Medicaid Other

## 2023-01-16 ENCOUNTER — Encounter (HOSPITAL_COMMUNITY): Payer: Self-pay | Admitting: Cardiovascular Disease

## 2023-01-17 ENCOUNTER — Other Ambulatory Visit: Payer: Self-pay

## 2023-01-17 ENCOUNTER — Other Ambulatory Visit: Payer: Self-pay | Admitting: Internal Medicine

## 2023-01-17 DIAGNOSIS — Z760 Encounter for issue of repeat prescription: Secondary | ICD-10-CM

## 2023-01-17 MED ORDER — GABAPENTIN 300 MG PO CAPS
300.0000 mg | ORAL_CAPSULE | Freq: Two times a day (BID) | ORAL | 1 refills | Status: DC
  Filled 2023-01-17: qty 180, 90d supply, fill #0

## 2023-01-18 ENCOUNTER — Other Ambulatory Visit: Payer: Medicaid Other

## 2023-01-18 NOTE — Patient Instructions (Signed)
  Medicaid Managed Care   Unsuccessful Outreach Note  01/18/2023 Name: Scott Greer MRN: [id number] DOB: [birth year]  Referred by: [name], MD Reason for referral : High Risk Managed Medicaid (MM social work unsuccessful telephone outreach )   An unsuccessful telephone outreach was attempted today. The patient was referred to the case management team for assistance with care management and care coordination.   Follow Up Plan: A HIPAA compliant phone message was left for the patient providing contact information and requesting a return call.    Mickel Fuchs, BSW, [place] Managed Medicaid Team  [phone]

## 2023-01-18 NOTE — Patient Outreach (Signed)
  Medicaid Managed Care   Unsuccessful Outreach Note  01/18/2023 Name: Scott Greer MRN: [id number] DOB: December 18, 1955  Referred by: [name], MD Reason for referral : High Risk Managed Medicaid (MM social work unsuccessful telephone outreach )   An unsuccessful telephone outreach was attempted today. The patient was referred to the case management team for assistance with care management and care coordination.   Follow Up Plan: A HIPAA compliant phone message was left for the patient providing contact information and requesting a return call.   Mickel Fuchs, BSW, [place] Managed Medicaid Team  [phone]

## 2023-01-22 ENCOUNTER — Telehealth: Payer: Self-pay

## 2023-01-22 ENCOUNTER — Other Ambulatory Visit: Payer: Self-pay

## 2023-01-22 NOTE — Telephone Encounter (Signed)
Updated CMN, detailed product description and PT mobility/ seating eval faxed to [facility]: [phone]

## 2023-01-23 ENCOUNTER — Other Ambulatory Visit: Payer: Self-pay

## 2023-01-23 ENCOUNTER — Encounter: Payer: Self-pay | Admitting: Physician Assistant

## 2023-01-23 ENCOUNTER — Telehealth (HOSPITAL_COMMUNITY): Payer: Self-pay | Admitting: Licensed Clinical Social Worker

## 2023-01-23 ENCOUNTER — Other Ambulatory Visit: Payer: Self-pay | Admitting: Critical Care Medicine

## 2023-01-23 MED ORDER — DOXYCYCLINE HYCLATE 100 MG PO TABS
100.0000 mg | ORAL_TABLET | Freq: Two times a day (BID) | ORAL | 0 refills | Status: DC
  Filled 2023-01-23: qty 14, 7d supply, fill #0

## 2023-01-23 NOTE — Telephone Encounter (Signed)
H&V Care Navigation CSW Progress Note  Clinical Social Worker received call from pt and assisted him in setting up follow up appt in clinic.  Able to get updated contact info for pt and added to chart.  Pt will set up medicaid ride and call CSW if that falls through so I can assist.  Patient is participating in a Managed Medicaid Plan:  Yes  [place]: Food Insecurity Present (11/20/2022)  Housing: High Risk (11/20/2022)  Transportation Needs: Unmet Transportation Needs (11/28/2022)  Alcohol Screen: Low Risk  (10/18/2021)  Depression (PHQ2-9): Medium Risk (12/14/2020)  Financial Resource Strain: High Risk (10/18/2021)  Physical Activity: Inactive (12/11/2021)  Social Connections: Socially Isolated (10/18/2021)  Stress: Stress Concern Present (02/27/2022)  Tobacco Use: High Risk (01/04/2023)   Jorge Ny, [facility] Desk#: [phone] Cell#: [phone]

## 2023-01-23 NOTE — Progress Notes (Signed)
Pt has missed PT appts and other appts.   He has another phone, [phone].  However, he wants to use the [phone] as his primary number.  He says he cannot pay for meds. He has Medicaid and there is a copay of $4 or so.  He is getting Rx at [place] [facility].  He is frustrated at how long it has taken to get him a wheelchair.   He is frustrated at urinary frequency, this has been related torsemide.   His meds are filled for him to take weekly, he is compliant.   Rosaria Ferries, PA-C 01/23/2023 2:50 PM

## 2023-01-23 NOTE — Telephone Encounter (Signed)
CSW messaged about helping pt reschedule appt with clinic and arrange transportation.  No phone number listed for pt at this time.  CSW sent message to pt PCP and to Congregational RN who might be able to see pt in person through [facility]- requested assistance in getting pt new appt with [place] and coordinating with me for transportation  Will continue to follow and assist as needed  Jorge Ny, [facility] Desk#: [phone] Cell#: [phone]

## 2023-01-23 NOTE — Telephone Encounter (Signed)
H&V Care Navigation CSW Progress Note  Clinical Social Worker received call back from [place] who reports she will follow up with [facility] case management and with pt regarding scheduling appt with our clinic and setting up transportation.  Patient is participating in a Managed Medicaid Plan:  Yes  [place]: Food Insecurity Present (11/20/2022)  Housing: High Risk (11/20/2022)  Transportation Needs: Unmet Transportation Needs (11/28/2022)  Alcohol Screen: Low Risk  (10/18/2021)  Depression (PHQ2-9): Medium Risk (12/14/2020)  Financial Resource Strain: High Risk (10/18/2021)  Physical Activity: Inactive (12/11/2021)  Social Connections: Socially Isolated (10/18/2021)  Stress: Stress Concern Present (02/27/2022)  Tobacco Use: High Risk (01/04/2023)    Jorge Ny, LCSW Clinical Social Worker [facility] Desk#: [phone] Cell#: [phone]

## 2023-01-24 ENCOUNTER — Other Ambulatory Visit: Payer: Self-pay

## 2023-01-24 DIAGNOSIS — E1121 Type 2 diabetes mellitus with diabetic nephropathy: Secondary | ICD-10-CM

## 2023-01-24 LAB — GLUCOSE, POCT (MANUAL RESULT ENTRY): POC Glucose: 103 mg/dl — AB (ref 70–99)

## 2023-01-24 NOTE — Congregational Nurse Program (Signed)
  Dept: [phone]   Congregational Nurse Program Note  Date of Encounter: 01/24/2023  Clinic visit to check blood pressure and blood glucose.  BP 112/66, pulse 65 and regular, O2 sat 98%.  Blood glucose 103 PC lunch.    Reviewed all of medications and given educated regarding Doxycycline to start this evening and take for 7 days for urinary symptoms. 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 Yes    Student Assistance [employer] Nurse    Location Patient [facility]    Visit Setting with Client Organization    Patient Status Unhoused    Insurance Medicaid    Insurance/Financial Assistance Referral N/A    Medication Have Medication Insecurities    Medical Provider Yes    Screening Referrals Made N/A    Medical Referrals Made N/A    Medical Appointment Made N/A    Recently w/o PCP, now 1st time PCP visit completed due to CNs referral or appointment made N/A    Food Have Food Insecurities    Transportation Need transportation assistance    Housing/Utilities No permanent housing    Interpersonal Safety Do not feel safe at current residence    Interventions Advocate/Support;Counsel;Educate;Case Management;Reviewed Medications    Abnormal to Normal Screening Since Last CN Visit N/A    Screenings CN Performed Blood Pressure;Pulse Ox;Blood Glucose    Sent Client to Lab for: N/A    Did client attend any of the following based off CNs referral or appointments made? N/A    ED Visit Averted N/A    Life-Saving Intervention Made N/A

## 2023-01-30 ENCOUNTER — Encounter: Payer: Self-pay | Admitting: Physician Assistant

## 2023-01-30 ENCOUNTER — Telehealth (HOSPITAL_COMMUNITY): Payer: Self-pay | Admitting: Licensed Clinical Social Worker

## 2023-01-30 ENCOUNTER — Other Ambulatory Visit: Payer: Medicaid Other | Admitting: *Deleted

## 2023-01-30 NOTE — Telephone Encounter (Addendum)
Attempted to call pt x2 to confirm he has arranged transport for 4/8 appt- unable to reach or leave VM- will continue to attempt  Jorge Ny, [facility] Desk#: [phone] Cell#: [phone]

## 2023-01-30 NOTE — Patient Instructions (Signed)
Visit Information  [name]  - as a part of your Medicaid benefit, you are eligible for care management and care coordination services at no cost or copay. I was unable to reach you by phone today but would be happy to help you with your health related needs. Please feel free to call me @ [phone].   A member of the Managed Medicaid care management team will reach out to you again over the next 7 days.   [name] RN, BSN [place] [facility] RN Care Coordinator [phone]

## 2023-01-30 NOTE — Progress Notes (Signed)
Pt seen by Dr Joya Gaskins.  PW has spoken to the people in charge of getting him the motorized wheelchair.  They told him they have all the paperwork they need. It will be sent in, hopefully will hear good news soon.   Pt still c/o being asked to pay for meds.   It was explained that the pharmacy will charge him the copays but put it on an account so he does not have to put out the money.   Has appt 04/08 at noon with the CHF clinic.   He has had problems with transportation not showing up, causing him to miss appts. PW will try to facilitate them showing up.    After a prolonged phone call, mult issues including wrong pt phone, not having all the information needed >> there were fixed. Hopefully the next appt will go better.  Lungs are clear, no JVD.  Today's Vitals   01/30/23 1442  BP: 139/69  Pulse: 63  SpO2: 99%   He still c/o frequent urination, but it is not as bad as it was. No SOB, DOE is at baseline.   Rosaria Ferries, PA-C 01/30/2023 2:47 PM

## 2023-01-30 NOTE — Patient Outreach (Signed)
  Medicaid Managed Care   Unsuccessful Attempt Note   01/30/2023 Name: Scott Greer MRN: [id number] DOB: 10/10/1956  Referred by: [name], MD Reason for referral : High Risk Managed Medicaid (Unsuccessful RNCM follow up telephone outreach)   An unsuccessful telephone outreach was attempted today. The patient was referred to the case management team for assistance with care management and care coordination.    Follow Up Plan: A HIPAA compliant phone message was left for the patient providing contact information and requesting a return call. and The Managed Medicaid care management team will reach out to the patient again over the next 7 days.    [name] RN, BSN [facility] RN Care Coordinator [phone]

## 2023-01-31 ENCOUNTER — Other Ambulatory Visit: Payer: Self-pay

## 2023-01-31 DIAGNOSIS — E1121 Type 2 diabetes mellitus with diabetic nephropathy: Secondary | ICD-10-CM

## 2023-01-31 LAB — GLUCOSE, POCT (MANUAL RESULT ENTRY): POC Glucose: 212 mg/dl — AB (ref 70–99)

## 2023-01-31 NOTE — Congregational Nurse Program (Signed)
  Dept: [phone]   Congregational Nurse Program Note  Date of Encounter: 01/31/2023  Clinic visit to check blood pressure and blood glucose and to assist with medications.  BP 149/71, pulse 70 and regular, had not taken medicine this am due to [facility] schedule. Reminded him that staff member can get his pillbox even when schedule for dayroom is different.  Blood glucose 212 after lunch, educated regarding limiting foods with high sugar content.  Reviewed all of medications, purpose, side effects and dosage.  Assisted with filling weekly pillbox. 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 Yes    Student Assistance [employer] Nurse    Location Patient [facility]    Visit Setting with Client Organization    Patient Status Unhoused    Insurance Medicaid    Insurance/Financial Assistance Referral N/A    Medication Have Medication Insecurities    Medical Provider Yes    Screening Referrals Made N/A    Medical Referrals Made N/A    Medical Appointment Made N/A    Recently w/o PCP, now 1st time PCP visit completed due to CNs referral or appointment made N/A    Food Have Food Insecurities    Transportation Need transportation assistance    Housing/Utilities No permanent housing    Interpersonal Safety Do not feel safe at current residence    Interventions Advocate/Support;Counsel;Educate;Case Management;Reviewed Medications    Abnormal to Normal Screening Since Last CN Visit N/A    Screenings CN Performed Blood Pressure;Pulse  Ox;Blood Glucose    Sent Client to Lab for: N/A    Did client attend any of the following based off CNs referral or appointments made? N/A    ED Visit Averted N/A    Life-Saving Intervention Made N/A

## 2023-02-01 ENCOUNTER — Telehealth (HOSPITAL_COMMUNITY): Payer: Self-pay | Admitting: Licensed Clinical Social Worker

## 2023-02-01 NOTE — Telephone Encounter (Signed)
H&V Care Navigation CSW Progress Note  Clinical Social Worker contacted pt to see if he had arranged Medicaid transport.  Pt reports he had no set up ride yet but that he has had too many issues with Medicaid transportation coming late and causing him to miss appts.  CSW agreed to set up taxi to come to appt with clinic on 4/8 as we need to reestablish care but that pt would need to utilize Medicaid transport after this.  Pt expressed understanding and reports he can get into taxi on his own and will just need help putting his wheelchair into the car.  Voucher sent in for taxi.   [place]: Food Insecurity Present (11/20/2022)  Housing: High Risk (11/20/2022)  Transportation Needs: Unmet Transportation Needs (02/01/2023)  Alcohol Screen: Low Risk  (10/18/2021)  Depression (PHQ2-9): Medium Risk (12/14/2020)  Financial Resource Strain: High Risk (10/18/2021)  Physical Activity: Inactive (12/11/2021)  Social Connections: Socially Isolated (10/18/2021)  Stress: Stress Concern Present (02/27/2022)  Tobacco Use: High Risk (01/04/2023)      02/01/2023  Scott Greer DOB: 1956-05-04 MRN: [id number]   RIDER WAIVER AND RELEASE OF LIABILITY  For the purposes of helping with transportation needs, [facility] partners with outside transportation providers (taxi companies, [place], Social research officer, government.) to give [employer] patients or other approved people the choice of on-demand rides [employer]") to our buildings for non-emergency visits.  By using [employer], I, the person signing this document, on behalf of myself and/or any legal minors (in my care using the [employer]), agree:  Government social research officer given to me are supplied by independent, outside transportation providers who do not work for, or have any affiliation with, [employer]. [place] is not a transportation company. [place] has no control over the quality or safety of the rides I get using [employer]. [place] has no control over whether any outside ride will happen on time or not. [place] gives no guarantee on the reliability, quality, safety, or availability on any rides, or that no mistakes will happen. I know and accept that traveling by vehicle (car, truck, SVU, Lucianne Lei, bus, taxi, etc.) has risks of serious injuries such as disability, being paralyzed, and death. I know and agree the risk of using [employer] is mine alone, and not [employer]. Transport Services are provided "as is" and as are available. The transportation providers are in charge for all inspections and care of the vehicles used to provide these rides. I agree not to take legal action against [place], its agents, employees, officers, directors, representatives, insurers, attorneys, assigns, successors, subsidiaries, and affiliates at any time for any reasons related directly or indirectly to using [employer]. I also agree not to take legal action against [employer] or its affiliates for any injury, death, or damage to property caused by or related to using [employer]. I have read this Waiver and Release of Liability, and I understand the terms used in it and their legal meaning. This Waiver is freely and voluntarily given with the understanding that my right (or any legal minors) to legal action against [place] relating to [employer] is knowingly given up to use these services.   I attest that I read the Ride Waiver and Release of Liability to Scott Greer, gave Mr. Vivian the opportunity to ask questions and answered the questions asked (if any). I affirm that Scott Greer then provided consent for assistance with transportation.  Jorge Ny

## 2023-02-05 DIAGNOSIS — E1121 Type 2 diabetes mellitus with diabetic nephropathy: Secondary | ICD-10-CM

## 2023-02-05 LAB — GLUCOSE, POCT (MANUAL RESULT ENTRY): POC Glucose: 214 mg/dl — AB (ref 70–99)

## 2023-02-05 NOTE — Congregational Nurse Program (Signed)
  Dept: [phone]   Congregational Nurse Program Note  Date of Encounter: 02/05/2023  Clinic visit for blood pressure and blood glucose checks,  BP 138/72, pulse 58 and regular.  Blood glucose 214 pc breakfast, had missed morning medications 2 consecutive days.  Reviewed all medications and educated regarding need to take medications as prescribed.  Appointment made for diabetic eye exam on 04/02/2023 with [facility].  Discussed need to resume physical therapy, sent message to Dr. Wynetta Emery regarding his discharge from [facility] d/t missed appoitnments from late transportation. Past Medical History: Past Medical History:  Diagnosis Date   CHF (congestive heart failure) ([place])    Dehiscence of amputation stump ([place])    left great toe   Diabetes mellitus without complication ([place]) [id number]   Type II   Glaucoma 2015   Hyperlipidemia    Hypertension 2005   Left foot infection 02/07/2021   Osteomyelitis ([place])    Wears glasses     Encounter Details:  CNP Questionnaire - 02/05/23 1005       Questionnaire   Ask client: Do you give verbal consent for me to treat you today? Yes    Student Assistance N/A    Location Patient [facility]    Visit Setting with Client Organization    Patient Status Unhoused    Insurance Medicaid    Insurance/Financial Assistance Referral N/A    Medication Have Medication Insecurities    Medical Provider Yes    Screening Referrals Made N/A    Medical Referrals Made [facility]    Medical Appointment Made [facility]    Recently w/o PCP, now 1st time PCP visit completed due to CNs referral or appointment made N/A    Food Have Food Insecurities    Transportation Need transportation assistance    Housing/Utilities No permanent housing    Interpersonal Safety Do not feel safe at current residence    Interventions Advocate/Support;Counsel;Educate;Case Management;Navigate Healthcare System    Abnormal to Normal Screening Since Last CN Visit  N/A    Screenings CN Performed Blood Pressure;Pulse Ox;Blood Glucose    Sent Client to Lab for: N/A    Did client attend any of the following based off CNs referral or appointments made? N/A    ED Visit Averted N/A    Life-Saving Intervention Made N/A

## 2023-02-06 ENCOUNTER — Other Ambulatory Visit: Payer: Self-pay

## 2023-02-08 ENCOUNTER — Telehealth (HOSPITAL_COMMUNITY): Payer: Self-pay

## 2023-02-08 ENCOUNTER — Other Ambulatory Visit: Payer: Self-pay

## 2023-02-08 NOTE — Telephone Encounter (Signed)
Called and left patient a voice message to confirm/remind patient of their appointment at the [facility] on 02/11/23.    

## 2023-02-08 NOTE — Progress Notes (Signed)
[facility] Note  PCP: Scott Greer, Scott B, MD HF Cardiologist: Dr. Shirlee LatchMcLean  HPI: [name] is a 67 y.o. with a history of HTN, hyperlipidemia, diabetes, s/p bilateral BKA, ETOH abuse, smoker and systolic heart failure.   He has been disabled for the last couple of years. He has difficulty paying for medications and housing. He stopped getting meds about 9 months before 12/2019 admission.    Admitted 2/21 with new acute systolic heart failure. Echo showed biventricular HF (LVEF 25-30% + severely reduced RV). Diuresed with IV lasix, and started on Entresto, Jardiance, and spiro. Had high PVC burden so Toprol XL increased to 100 mg daily. Cath showed with elevated filling pressures, low output HF, and min CAD. Diuresed 20 lbs, started on GDMT, discharge weight 199 lbs.  Last seen 01/2021. Unfortunately lost to follow up.  Today he returns for HF follow up. He is now s/p R and L BKA. He does not know what medications he is taking but he has a Engineer, watercongregational nurse who [facility] and [facility] his pill box.  Overall feeling fine. He has SOB rolling his wheelchair uphill but no dyspnea rolling on flat ground. Denies CP, abnormal bleeding, dizziness, edema, or PND/Orthopnea. Appetite ok. No fever or chills. He is unable to weigh. Taking all medications. He has prosthetics but transportation issues prevents him from getting to PT appts. Smokes 2 cigars/day, occasional ETOH, no drugs.  ECG (personally reviewed): NSR 74 bpm  Labs (12/23): K 3.6, creatinine 1.94, hgb 10.7   Cardiac Studies:  RHC/LHC (2/22): Ost LAD -Prox LAD 30% stenosed RA 17 PA 47/27 (36)  PCWP 22 CO 3 CI 1.3  cMRI (3/22):  -Technically difficult study due to poor breath-holding. - LV EF 23%, RVEF 22%, nonspecific RV insertion LGE.  ROS: All systems negative except as listed in HPI, PMH and Problem List.  SH:  Social History   Socioeconomic History   Marital status: Divorced    Spouse name:  Scott Greer   Number of children: 2   Years of education: Not on file   Highest education level: Associate degree: occupational, Scientist, product/process developmenttechnical, or vocational program  Occupational History   Not on file  Tobacco Use   Smoking status: Every Day    Types: Cigars   Smokeless tobacco: Former    Types: Chew   Tobacco comments:    8 daily  Vaping Use   Vaping Use: Never used  Substance and Sexual Activity   Alcohol use: Yes    Alcohol/week: 11.0 standard drinks of alcohol    Types: 3 Cans of beer, 8 Shots of liquor per week    Comment: occasional   Drug use: Yes    Types: Marijuana    Comment: ocassional - last time 05/08/2020   Sexual activity: Not Currently  Other Topics Concern   Not on file  Social History Narrative   Out of prison for 2 months.   Lives at home with his wife.   Social Determinants of Health   Financial Resource Strain: High Risk (10/18/2021)   Overall Financial Resource Strain (CARDIA)    Difficulty of Paying Living Expenses: Very hard  Food Insecurity: Food Insecurity Present (11/20/2022)   Hunger Vital Sign    Worried About Running Out of Food in the Last Year: Sometimes true    Ran Out of Food in the Last Year: Sometimes true  Transportation Needs: Unmet Transportation Needs (02/01/2023)   PRAPARE - Transportation    Lack of  Transportation (Medical): Yes    Lack of Transportation (Non-Medical): Yes  Physical Activity: Inactive (12/11/2021)   Exercise Vital Sign    Days of Exercise per Week: 0 days    Minutes of Exercise per Session: 0 min  Stress: Stress Concern Present (02/27/2022)   [employer] of Occupational Health - Occupational Stress Questionnaire    Feeling of Stress : Very much  Social Connections: Socially Isolated (10/18/2021)   Social Connection and Isolation Panel [NHANES]    Frequency of Communication with Friends and Family: Three times a week    Frequency of Social Gatherings with Friends and Family: Twice a week    Attends Religious  Services: Never    Database administrator or Organizations: No    Attends Engineer, structural: Never    Marital Status: Divorced  Catering manager Violence: Not on file   FH:  Family History  Problem Relation Age of Onset   Stroke Mother    Diabetes Mother        Toward end of life   Past Medical History:  Diagnosis Date   CHF (congestive heart failure) (HCC)    Dehiscence of amputation stump (HCC)    left great toe   Diabetes mellitus without complication (HCC) 1999   Type II   Glaucoma 2015   Hyperlipidemia    Hypertension 2005   Left foot infection 02/07/2021   Osteomyelitis ([facility])    Wears glasses    Current Outpatient Medications  Medication Sig Dispense Refill   doxycycline (VIBRA-TABS) 100 MG tablet Take 1 tablet (100 mg total) by mouth 2 (two) times daily. 14 tablet 0   amiodarone (PACERONE) 200 MG tablet Take 1 tablet (200 mg total) by mouth daily. 30 tablet 2   aspirin (ASPIRIN LOW DOSE) 81 MG chewable tablet CHEW ONE TABLET BY MOUTH ONCE DAILY Needs appointment for further refills 30 tablet 2   atorvastatin (LIPITOR) 10 MG tablet Take 1 tablet (10 mg total) by mouth every evening. 30 tablet 6   carvedilol (COREG) 3.125 MG tablet Take 1 tablet (3.125 mg total) by mouth 2 (two) times daily with a meal. 60 tablet 6   digoxin (LANOXIN) 0.125 MG tablet Take 1 tablet (0.125 mg total) by mouth every evening with supper. 30 tablet 6   empagliflozin (JARDIANCE) 10 MG TABS tablet Take 1 tablet (10 mg total) by mouth daily at 12 noon. 30 tablet 6   ferrous sulfate (FEROSUL) 325 (65 FE) MG tablet TAKE ONE TABLET BY MOUTH EVERY MORNING WITH BREAKFAST 100 tablet 1   gabapentin (NEURONTIN) 300 MG capsule Take 1 capsule (300 mg total) by mouth 2 (two) times daily. 180 capsule 1   glimepiride (AMARYL) 1 MG tablet Take 1 tablet (1 mg total) by mouth daily with breakfast. 30 tablet 6   isosorbide-hydrALAZINE (BIDIL) 20-37.5 MG tablet Take 1 tablet by mouth 2 (two) times daily.  90 tablet 6   sacubitril-valsartan (ENTRESTO) 24-26 MG Take 1 tablet by mouth 2 (two) times daily. 60 tablet 6   spironolactone (ALDACTONE) 25 MG tablet Take 1 tablet (25 mg total) by mouth every morning. 30 tablet 6   torsemide (DEMADEX) 20 MG tablet Take 1 tablet (20 mg total) by mouth daily. 30 tablet 1   No current facility-administered medications for this encounter.   BP 120/80   Pulse 73   SpO2 98%   Wt Readings from Last 3 Encounters:  08/14/22 108.9 kg (240 lb)  03/20/22 108.9 kg (240 lb)  01/20/22 61.2 kg (135 lb)   PHYSICAL EXAM: General:  NAD. No resp difficulty, [facility] HEENT: Normal Neck: Supple. No JVD. Carotids 2+ bilat; no bruits. No lymphadenopathy or thryomegaly appreciated. Cor: PMI nondisplaced. Regular rate & rhythm. No rubs, gallops or murmurs. Lungs: Clear Abdomen: Soft, nontender, nondistended. No hepatosplenomegaly. No bruits or masses. Good bowel sounds. Extremities: No cyanosis, clubbing, rash, edema; bilateral BKA Neuro: Alert & oriented x 3, cranial nerves grossly intact. Moves all 4 extremities w/o difficulty. Affect pleasant.  ASSESSMENT & PLAN: 1. Chronic systolic CHF: Echo this admission reviewed, EF 20-25% range with severe RV dysfunction.  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  HIV negative.  He drinks, but not markedly heavily (pint/week liquor). He apparently had poorly controlled HTN prior to admission (not taking meds for about 9 months). 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  RHC on 01/02/21 showed elevated filling pressures especially on the right side, also low CI at 1.3. cMRI with LV EF 23%, RVEF 22%, nonspecific RV insertion LGE. Diuresed 50 lbs during admission. Unfortunately lost to follow up.  Today, NYHA II, volume difficult but he does not appear overloaded.  - Continue torsemide 20 mg daily. BMET and BNP today. - Continue  BiDil 1 tab bid (will not take tid) - Continue Entresto 24/26 mg bid. - Continue Jardiance 10 mg daily.  - Continue spironolactone 25 mg daily.  - Continue digoxin 0.125 daily. Dig level today. - Continue Coreg 3.125 mg bid. - Update echo. - Given social situation & ETOH use, he would not be a good candidate for advanced therapies at this time.  2. CKD: Stage 3a.  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  BMET today. 3. HTN: BP controlled. 4. PVCs: NSR on ECG today. - Decrease amiodarone to 100 mg daily. Check TSH and LFTs today. 5. ETOH abuse: Previously drank 1 pint liquor/week. Has cut back. - Needs complete cessation. 6. SDOH: Engage HFSW & consult Managed Medicaid to hlep with rides to appt.  Follow up in 3 weeks with PharmD for med check (I asked him to bring all of his medications to this visit), and 3 months with Dr. Shirlee Latch. Will update echo to look for EF improvement.  Prince Rome, FNP-BC 02/11/23

## 2023-02-11 ENCOUNTER — Telehealth (HOSPITAL_COMMUNITY): Payer: Self-pay | Admitting: Licensed Clinical Social Worker

## 2023-02-11 ENCOUNTER — Encounter (HOSPITAL_COMMUNITY): Payer: Self-pay

## 2023-02-11 ENCOUNTER — Ambulatory Visit (HOSPITAL_COMMUNITY)
Admission: RE | Admit: 2023-02-11 | Discharge: 2023-02-11 | Disposition: A | Discharge status: Home / Self Care | Payer: Medicaid Other | Source: Ambulatory Visit | Attending: Family Medicine | Admitting: Family Medicine

## 2023-02-11 ENCOUNTER — Other Ambulatory Visit: Payer: Self-pay

## 2023-02-11 VITALS — BP 120/80 | HR 73

## 2023-02-11 DIAGNOSIS — Z89511 Acquired absence of right leg below knee: Secondary | ICD-10-CM | POA: Diagnosis not present

## 2023-02-11 DIAGNOSIS — F17290 Nicotine dependence, other tobacco product, uncomplicated: Secondary | ICD-10-CM | POA: Diagnosis not present

## 2023-02-11 DIAGNOSIS — E1122 Type 2 diabetes mellitus with diabetic chronic kidney disease: Secondary | ICD-10-CM | POA: Insufficient documentation

## 2023-02-11 DIAGNOSIS — N183 Chronic kidney disease, stage 3 unspecified: Secondary | ICD-10-CM

## 2023-02-11 DIAGNOSIS — Z89512 Acquired absence of left leg below knee: Secondary | ICD-10-CM | POA: Insufficient documentation

## 2023-02-11 DIAGNOSIS — Z5982 Transportation insecurity: Secondary | ICD-10-CM | POA: Insufficient documentation

## 2023-02-11 DIAGNOSIS — R0602 Shortness of breath: Secondary | ICD-10-CM | POA: Insufficient documentation

## 2023-02-11 DIAGNOSIS — Z7984 Long term (current) use of oral hypoglycemic drugs: Secondary | ICD-10-CM | POA: Insufficient documentation

## 2023-02-11 DIAGNOSIS — N1831 Chronic kidney disease, stage 3a: Secondary | ICD-10-CM | POA: Insufficient documentation

## 2023-02-11 DIAGNOSIS — I5022 Chronic systolic (congestive) heart failure: Secondary | ICD-10-CM

## 2023-02-11 DIAGNOSIS — I2510 Atherosclerotic heart disease of native coronary artery without angina pectoris: Secondary | ICD-10-CM | POA: Diagnosis not present

## 2023-02-11 DIAGNOSIS — I130 Hypertensive heart and chronic kidney disease with heart failure and stage 1 through stage 4 chronic kidney disease, or unspecified chronic kidney disease: Secondary | ICD-10-CM | POA: Insufficient documentation

## 2023-02-11 DIAGNOSIS — I10 Essential (primary) hypertension: Secondary | ICD-10-CM | POA: Diagnosis not present

## 2023-02-11 DIAGNOSIS — Z79899 Other long term (current) drug therapy: Secondary | ICD-10-CM | POA: Insufficient documentation

## 2023-02-11 DIAGNOSIS — E785 Hyperlipidemia, unspecified: Secondary | ICD-10-CM | POA: Insufficient documentation

## 2023-02-11 DIAGNOSIS — I493 Ventricular premature depolarization: Secondary | ICD-10-CM | POA: Insufficient documentation

## 2023-02-11 DIAGNOSIS — F1010 Alcohol abuse, uncomplicated: Secondary | ICD-10-CM

## 2023-02-11 DIAGNOSIS — I428 Other cardiomyopathies: Secondary | ICD-10-CM | POA: Diagnosis not present

## 2023-02-11 DIAGNOSIS — Z139 Encounter for screening, unspecified: Secondary | ICD-10-CM

## 2023-02-11 LAB — COMPREHENSIVE METABOLIC PANEL
ALT: 14 U/L (ref 0–44)
AST: 15 U/L (ref 15–41)
Albumin: 3.8 g/dL (ref 3.5–5.0)
Alkaline Phosphatase: 40 U/L (ref 38–126)
Anion gap: 8 (ref 5–15)
BUN: 39 mg/dL — ABNORMAL HIGH (ref 8–23)
CO2: 26 mmol/L (ref 22–32)
Calcium: 9.2 mg/dL (ref 8.9–10.3)
Chloride: 104 mmol/L (ref 98–111)
Creatinine, Ser: 2.12 mg/dL — ABNORMAL HIGH (ref 0.61–1.24)
GFR, Estimated: 34 mL/min — ABNORMAL LOW (ref >60)
Glucose, Bld: 192 mg/dL — ABNORMAL HIGH (ref 70–99)
Potassium: 4.1 mmol/L (ref 3.5–5.1)
Sodium: 138 mmol/L (ref 135–145)
Total Bilirubin: 0.5 mg/dL (ref 0.3–1.2)
Total Protein: 6.5 g/dL (ref 6.5–8.1)

## 2023-02-11 LAB — DIGOXIN LEVEL: Digoxin Level: 0.3 ng/mL — ABNORMAL LOW (ref 0.8–2.0)

## 2023-02-11 LAB — BRAIN NATRIURETIC PEPTIDE: B Natriuretic Peptide: 43.2 pg/mL (ref 0.0–100.0)

## 2023-02-11 LAB — TSH: TSH: 0.938 u[IU]/mL (ref 0.350–4.500)

## 2023-02-11 MED ORDER — AMIODARONE HCL 200 MG PO TABS
100.0000 mg | ORAL_TABLET | Freq: Every day | ORAL | 8 refills | Status: DC
  Filled 2023-02-11 – 2023-03-04 (×2): qty 15, 30d supply, fill #0

## 2023-02-11 NOTE — Telephone Encounter (Signed)
H&V Care Navigation CSW Progress Note  Clinical Social Worker called pt and reminded of ride picking him up at 11am.  Pt expressed understanding and will be waiting for ride.  Patient is participating in a Managed Medicaid Plan:  Yes  SDOH Screenings   Food Insecurity: Food Insecurity Present (11/20/2022)  Housing: High Risk (11/20/2022)  Transportation Needs: Unmet Transportation Needs (02/01/2023)  Alcohol Screen: Low Risk  (10/18/2021)  Depression (PHQ2-9): Medium Risk (12/14/2020)  Financial Resource Strain: High Risk (10/18/2021)  Physical Activity: Inactive (12/11/2021)  Social Connections: Socially Isolated (10/18/2021)  Stress: Stress Concern Present (02/27/2022)  Tobacco Use: High Risk (01/04/2023)     Burna Sis, LCSW Clinical Social Worker [facility] Desk#: [phone] Cell#: [phone]

## 2023-02-11 NOTE — Progress Notes (Signed)
H&V Care Navigation CSW Progress Note  Clinical Social Worker placed consult to managed medicaid team to help pt arrange future rides with medicaid.  Has had issues with setting up rides in the past and having them come on time so hopeful medicaid team can help ensure this is not due to user error.  Patient is participating in a Managed Medicaid Plan:  Yes  SDOH Screenings   Food Insecurity: Food Insecurity Present (11/20/2022)  Housing: High Risk (11/20/2022)  Transportation Needs: Unmet Transportation Needs (02/01/2023)  Alcohol Screen: Low Risk  (10/18/2021)  Depression (PHQ2-9): Medium Risk (12/14/2020)  Financial Resource Strain: High Risk (10/18/2021)  Physical Activity: Inactive (12/11/2021)  Social Connections: Socially Isolated (10/18/2021)  Stress: Stress Concern Present (02/27/2022)  Tobacco Use: High Risk (01/04/2023)     Burna Sis, LCSW Clinical Social Worker [facility] Desk#: [phone] Cell#: [phone]

## 2023-02-11 NOTE — Patient Instructions (Addendum)
Thank you for coming in today  Labs were done today, if any labs are abnormal the clinic will call you No news is good news  Medications: DECREASE Amiodarone to 100 mg 1/2 tablet daily   Follow up appointments: echocardiogram  Your physician has requested that you have an echocardiogram. Echocardiography is a painless test that uses sound waves to create images of your heart. It provides your doctor with information about the size and shape of your heart and how well your heart's chambers and valves are working. This procedure takes approximately one hour. There are no restrictions for this procedure.   3-4 weeks with Pharmacy  3 months with Dr. Earlean Shawl will receive a reminder letter in the mail a few months in advance. If you don't receive a letter, please call our office to schedule the follow-up appointment.   Your physician recommends that you schedule a follow-up appointment in:     Do the following things EVERYDAY: Weigh yourself in the morning before breakfast. Write it down and keep it in a log. Take your medicines as prescribed Eat low salt foods--Limit salt (sodium) to 2000 mg per day.  Stay as active as you can everyday Limit all fluids for the day to less than 2 liters   At the [facility], you and your health needs are our priority. As part of our continuing mission to provide you with exceptional heart care, we have created designated Provider Care Teams. These Care Teams include your primary Cardiologist (physician) and Advanced Practice Providers (APPs- Physician Assistants and Nurse Practitioners) who all work together to provide you with the care you need, when you need it.   You may see any of the following providers on your designated Care Team at your next follow up: Dr Arvilla Meres Dr Marca Ancona Dr. Marcos Eke, NP Robbie Lis, [name] [facility], [name] Brynda Peon, NP Karle Plumber,  PharmD   Please be sure to bring in all your medications bottles to every appointment.    Thank you for choosing [facility]  If you have any questions or concerns before your next appointment please send [place] a message through [place] or call our office at [phone].    TO LEAVE A MESSAGE FOR THE NURSE SELECT OPTION 2, PLEASE LEAVE A MESSAGE INCLUDING: YOUR NAME DATE OF BIRTH CALL BACK NUMBER REASON FOR CALL**this is important as we prioritize the call backs  YOU WILL RECEIVE A CALL BACK THE SAME DAY AS LONG AS YOU CALL BEFORE 4:00 PM

## 2023-02-12 ENCOUNTER — Other Ambulatory Visit: Payer: Self-pay

## 2023-02-12 ENCOUNTER — Telehealth (HOSPITAL_COMMUNITY): Payer: Self-pay

## 2023-02-12 NOTE — Telephone Encounter (Signed)
Outpatient rehab where referral sent yesterday - they called and stated patient was discharged in March due to level of function and issues with bathing and clothing. They gave him exercises to do at home and recommended he do those first before trying to come back to rehab. They stated he was non compliant with issues mentioned and they seem hesitant to bring him back into facility. Do you wish to proceed differently?

## 2023-02-12 NOTE — Telephone Encounter (Signed)
I spoke to outpatient rehab and they indicated they will try to get him in. They were concerned because of transportation issues and missing a lot of appointments. She said he had trouble working his phone to assist with transportation and appointments. I explained that Scott Greer would be assisting with some of these issues for the patient. They will work to get him scheduled to come back.

## 2023-02-12 NOTE — Congregational Nurse Program (Signed)
  Dept: [phone]   Congregational Nurse Program Note  Date of Encounter: 02/12/2023  Clinic visit to review lab results from 4/4 MD visit and to discuss medication changes.  Missed taking medications over the weekend due to being out of [employer] with family visiting.  Was to have decreased Pacerone to one-half tablet, 100 mg daily.  Called to pharmacy to verify that medication only comes in 200 mg tablets and that he is to take one-half  tablet,  explained this to him.   Past Medical History: Past Medical History:  Diagnosis Date   CHF (congestive heart failure)    Dehiscence of amputation stump    left great toe   Diabetes mellitus without complication 1999   Type II   Glaucoma 2015   Hyperlipidemia    Hypertension 2005   Left foot infection 02/07/2021   Osteomyelitis    Wears glasses     Encounter Details:  CNP Questionnaire - 02/12/23 0955       Questionnaire   Ask client: Do you give verbal consent for me to treat you today? Yes    Student Assistance N/A    Location Patient Served  [facility]    Visit Setting with Client Organization    Patient Status Unhoused    Insurance Medicaid    Insurance/Financial Assistance Referral N/A    Medication Have Medication Insecurities    Medical Provider Yes    Screening Referrals Made N/A    Medical Referrals Made N/A    Medical Appointment Made N/A    Recently w/o PCP, now 1st time PCP visit completed due to CNs referral or appointment made N/A    Food Have Food Insecurities    Transportation Need transportation assistance    Housing/Utilities No permanent housing    Interpersonal Safety Do not feel safe at current residence    Interventions Advocate/Support;Counsel;Educate;Case Management    Abnormal to Normal Screening Since Last CN Visit N/A    Screenings CN Performed N/A    Sent Client to Lab for: N/A    Did client attend any of the following based off CNs referral or appointments made? N/A    ED Visit Averted  N/A    Life-Saving Intervention Made N/A

## 2023-02-15 ENCOUNTER — Other Ambulatory Visit: Payer: Medicaid Other

## 2023-02-15 NOTE — Patient Instructions (Signed)
  Medicaid Managed Care   Unsuccessful Outreach Note  02/15/2023 Name: Scott Greer MRN: [id number] DOB: [birth year]  Referred by: [name], MD Reason for referral : High Risk Managed Medicaid (MM Social work unsuccessful telephone outreach )   An unsuccessful telephone outreach was attempted today. The patient was referred to the case management team for assistance with care management and care coordination.   Follow Up Plan: The care management team will reach out to the patient again over the next 7-10 days.   Abelino Derrick, MHA [facility]  Managed [facility] Social Worker [phone]

## 2023-02-15 NOTE — Patient Outreach (Signed)
  Medicaid Managed Care   Unsuccessful Outreach Note  02/15/2023 Name: Scott Greer MRN: [id number] DOB: January 31, 1956  Referred by: [name], MD Reason for referral : High Risk Managed Medicaid (MM Social work unsuccessful telephone outreach )   An unsuccessful telephone outreach was attempted today. The patient was referred to the case management team for assistance with care management and care coordination.   Follow Up Plan: The care management team will reach out to the patient again over the next 7-10 days.   2 attempts made.  Abelino Derrick, MHA [facility]  Managed [facility] Social Worker [phone]

## 2023-02-19 NOTE — Congregational Nurse Program (Signed)
  Dept: [phone]   Congregational Nurse Program Note  Date of Encounter: 02/19/2023  Clinic visit for follow-up from cardiology appointment.  Referral was made for him to resume physical therapy.  Case manager from [facility] had called him but d/t building renovations he had been outside and missed the call.  Notified the CM of the issue and she is to call him back on Thursday afternoon as building work to be completed tomorrow.  She will make the arrangements for the therapy appointments. Past Medical History: Past Medical History:  Diagnosis Date   CHF (congestive heart failure)    Dehiscence of amputation stump    left great toe   Diabetes mellitus without complication 1999   Type II   Glaucoma 2015   Hyperlipidemia    Hypertension 2005   Left foot infection 02/07/2021   Osteomyelitis    Wears glasses     Encounter Details:  CNP Questionnaire - 02/19/23 1130       Questionnaire   Ask client: Do you give verbal consent for me to treat you today? Yes    Student Assistance N/A    Location Patient Served  [facility]    Visit Setting with Client Organization    Patient Status Unhoused    Insurance Medicaid    Insurance/Financial Assistance Referral N/A    Medication Have Medication Insecurities    Medical Provider Yes    Screening Referrals Made N/A    Medical Referrals Made N/A    Medical Appointment Made N/A    Recently w/o PCP, now 1st time PCP visit completed due to CNs referral or appointment made N/A    Food Have Food Insecurities    Transportation Need transportation assistance    Housing/Utilities No permanent housing    Interpersonal Safety Do not feel safe at current residence    Interventions Advocate/Support;Counsel;Educate;Case Management;Navigate Healthcare System;Spiritual Care    Abnormal to Normal Screening Since Last CN Visit N/A    Screenings CN Performed N/A    Sent Client to Lab for: N/A    Did client attend any of the following based off  CNs referral or appointments made? N/A    ED Visit Averted N/A    Life-Saving Intervention Made N/A

## 2023-02-19 NOTE — Congregational Nurse Program (Signed)
  Dept: [phone]   Congregational Nurse Program Note  Date of Encounter: 02/14/2023  Clinic visit to check blood pressure and assist with medications.  BP 132/71, pulse 62, O2 Sat 97%.  Had not asked [facility] staff for his medications, due to building renovations in progress residents were to remain outside from after breakfast until workers left for the day.  Given medication bag and assisted with pillbox, educated regarding necessity of taking medications as prescribed and to ask staff to bring bag containing pillbox and other medications each time he is to take medicines. Past Medical History: Past Medical History:  Diagnosis Date   CHF (congestive heart failure)    Dehiscence of amputation stump    left great toe   Diabetes mellitus without complication 1999   Type II   Glaucoma 2015   Hyperlipidemia    Hypertension 2005   Left foot infection 02/07/2021   Osteomyelitis    Wears glasses     Encounter Details:  CNP Questionnaire - 02/14/23 1300       Questionnaire   Ask client: Do you give verbal consent for me to treat you today? Yes    Student Assistance [employer] Nurse    Location Patient Served  [facility]    Visit Setting with Client Organization    Patient Status Unhoused    Insurance Medicaid    Insurance/Financial Assistance Referral N/A    Medication Have Medication Insecurities    Medical Provider Yes    Screening Referrals Made N/A    Medical Referrals Made N/A    Medical Appointment Made N/A    Recently w/o PCP, now 1st time PCP visit completed due to CNs referral or appointment made N/A    Food Have Food Insecurities    Transportation Need transportation assistance    Housing/Utilities No permanent housing    Interpersonal Safety Do not feel safe at current residence    Interventions Advocate/Support;Counsel;Educate;Case Management;Reviewed Medications    Abnormal to Normal Screening Since Last CN Visit N/A    Screenings CN Performed N/A    Sent  Client to Lab for: N/A    Did client attend any of the following based off CNs referral or appointments made? N/A    ED Visit Averted N/A    Life-Saving Intervention Made N/A

## 2023-02-21 ENCOUNTER — Other Ambulatory Visit: Payer: Medicaid Other

## 2023-02-22 NOTE — Patient Outreach (Signed)
Medicaid Managed Care Social Work Note  02/22/2023 Name:  Scott Greer MRN:  [id number] DOB:  [birth year]  Scott Greer is an 67 y.o. year old male who is a primary patient of [name], MD.  The Medicaid Managed Care Coordination team was consulted for assistance with:  Transportation Needs   [name] was given information about Medicaid Managed Care Coordination team services today. Scott Greer Patient agreed to services and verbal consent obtained.  Engaged with patient  for by telephone forfollow up visit in response to referral for case management and/or care coordination services.   Assessments/Interventions:  Review of past medical history, allergies, medications, health status, including review of consultants reports, laboratory and other test data, was performed as part of comprehensive evaluation and provision of chronic care management services.  SDOH: (Social Determinant of Health) assessments and interventions performed: SDOH Interventions    Flowsheet Row ESTABLISHED CHF from 02/11/2023 in [facility] Telephone from 02/01/2023 in [facility] Patient Outreach Telephone from 11/28/2022 in [place] [facility]ULATION HEALTH DEPARTMENT Patient Outreach Telephone from 11/20/2022 in [place] POPULATION HEALTH DEPARTMENT Patient Outreach Telephone from 06/26/2022 in [facility] Care Coordination Patient Outreach Telephone from 04/16/2022 in [facility] Care Coordination  SDOH Interventions        Food Insecurity Interventions -- -- -- Intervention Not Indicated -- --  Housing Interventions -- -- -- Other (Comment)  [Collaborated with Development worker, international aid, [facility] and BSW] Other (Comment)  [Patient is currently homeless. RNCM collaborated with [employer]. Scott Greer will follow up this afternoon after contacting  [employer] [facility]. Scott Greer with Social Services with APS is now involved] Intervention Not Indicated  [Patient working with [employer] and an agent Scott Greer to find housing. Currently living in [place].]  Transportation Interventions Payor Benefit Taxi Voucher Given Intervention Not Indicated  [Patient using transportation provided by Medicaid] -- -- Other (Comment)  [Provided with [employer] transportation [phone]     BSW completed a telephone outreach with patient, he stated he is still at [employer] and they have to be out most of the day due to renovations. Patient express frustration with getting his own place and states Medicaid transportation never gets him to his appointment on time. BSW contacted [facility] for transportation option but they stated they do not have any volunteers for transportation right now.   Advanced Directives Status:  Not addressed in this encounter.  Care Plan                 Allergies  Allergen Reactions   Bee Venom Hives, Itching and Swelling    Medications Reviewed Today     Reviewed by [name], FNP (Family Nurse Practitioner) on 02/11/23 at 1345  Med List Status: <None>   Medication Order Taking? Sig Documenting Provider Last Dose Status Informant  amiodarone (PACERONE) 200 MG tablet [id number]  Take 0.5 tablets (100 mg total) by mouth daily. Mar-Mac, [name], [place]  Active   aspirin (ASPIRIN LOW DOSE) 81 MG chewable tablet [id number] Yes CHEW ONE TABLET BY MOUTH ONCE DAILY Needs appointment for further refills [name], PA-C Taking Active   atorvastatin (LIPITOR) 10 MG tablet [id number] Yes Take 1 tablet (10 mg total) by mouth every evening. [name], MD Taking Active   carvedilol (COREG) 3.125 MG tablet [id number] Yes Take 1 tablet (3.125 mg total) by  mouth 2 (two) times daily with a meal. [name], MD Taking Active   digoxin (LANOXIN) 0.125 MG tablet [id number] Yes Take 1 tablet (0.125  mg total) by mouth every evening with supper. [name], MD Taking Active   empagliflozin (JARDIANCE) 10 MG TABS tablet [id number] Yes Take 1 tablet (10 mg total) by mouth daily at 12 noon. [name], MD Taking Active   ferrous sulfate (FEROSUL) 325 (65 FE) MG tablet [id number] Yes TAKE ONE TABLET BY MOUTH EVERY MORNING WITH BREAKFAST [name], MD Taking Active   gabapentin (NEURONTIN) 300 MG capsule [id number] Yes Take 1 capsule (300 mg total) by mouth 2 (two) times daily. [name], MD Taking Active   glimepiride (AMARYL) 1 MG tablet [id number] Yes Take 1 tablet (1 mg total) by mouth daily with breakfast. [name], MD Taking Active   isosorbide-hydrALAZINE (BIDIL) 20-37.5 MG tablet [id number] Yes Take 1 tablet by mouth 2 (two) times daily. [name], MD Taking Active   sacubitril-valsartan [name]) 24-26 [place] [id number] Yes Take 1 tablet by mouth 2 (two) times daily. [name], MD Taking Active   spironolactone (ALDACTONE) 25 MG tablet [id number] Yes Take 1 tablet (25 mg total) by mouth every morning. [name], MD Taking Active   torsemide (DEMADEX) 20 MG tablet [id number] Yes Take 1 tablet (20 mg total) by mouth daily. [name], MD Taking Active             Patient Active Problem List   Diagnosis Date Noted   Encounter for power mobility device assessment 01/03/2023   Chronic combined systolic and diastolic CHF (congestive heart failure) 12/01/2021   S/P BKA (below knee amputation) bilateral 07/18/2021   Gangrene of right foot    Foot pain, right 04/14/2021   Status post below-knee amputation 02/17/2021   Wound dehiscence    Gangrene of toe of left foot    Cutaneous abscess of left foot    Left foot infection 02/07/2021   Leukocytosis 02/07/2021   Thrombocytosis 02/07/2021   Hyponatremia 02/07/2021   AKI (acute kidney injury) 02/07/2021   Hyperglycemia due to diabetes mellitus 02/07/2021   New onset of  congestive heart failure 12/26/2020   CKD (chronic kidney disease) stage 3, GFR 30-59 ml/min 12/26/2020   Acute systolic CHF (congestive heart failure) 12/26/2020   Anemia, chronic disease 06/22/2020   Amputee, great toe, right 06/20/2020   Normocytic anemia 06/20/2020   Erectile dysfunction associated with type 2 diabetes mellitus 06/20/2020   Positive for macroalbuminuria 10/24/2019   Amputation of left great toe 10/23/2019   Tobacco dependence 10/23/2019   Diabetic foot infection    Noncompliance    Glaucoma    Hyperlipidemia    Essential hypertension 11/06/2003   Diabetes mellitus 11/05/1997    Conditions to be addressed/monitored per PCP order:   transportation and housing  There are no care plans that you recently modified to display for this patient.   Follow up:  Patient agrees to Care Plan and Follow-up.  Plan: The Managed Medicaid care management team will reach out to the patient again over the next 10 days.  Date/time of next scheduled Social Work care management/care coordination outreach:  03/08/23  Gus Puma, Kenard Gower, [facility]  Managed [facility] Social Worker [phone]

## 2023-02-22 NOTE — Patient Instructions (Signed)
Visit Information  Mr. Leppo was given information about Medicaid Managed Care team care coordination services as a part of their [facility] Medicaid benefit. [name] verbally consented to engagement with the [facility] Managed Care team.   If you are experiencing a medical emergency, please call 911 or report to your local emergency department or urgent care.   If you have a non-emergency medical problem during routine business hours, please contact your provider's office and ask to speak with a nurse.   For questions related to your [facility] health plan, please call: [phone] or go here:[email or web address]  If you would like to schedule transportation through your [facility] plan, please call the following number at least 2 days in advance of your appointment: [phone].  You can also use the MTM portal or MTM mobile app to manage your rides. For the portal, please go to [email or web address]  Call the [facility] Crisis Line at [phone], at any time, 24 hours a day, 7 days a week. If you are in danger or need immediate medical attention call 911.  If you would like help to quit smoking, call 1-800-QUIT-NOW ((775) 604-6336) OR Espaol: 1-855-Djelo-Ya (4-132-440-1027) o para ms informacin haga clic aqu or Text READY to 253-664 to register via text  Mr. Demartin - following are the goals we discussed in your visit today:   Goals Addressed   None      Social Worker will follow up on 03/08/23.   Gus Puma, Kenard Gower, MHA [facility]  Managed Medicaid Social Worker [phone]   Following is a copy of your plan of care:  There are no care plans that you recently modified to display for this patient.

## 2023-02-26 ENCOUNTER — Encounter: Payer: Self-pay | Admitting: *Deleted

## 2023-02-26 DIAGNOSIS — Z139 Encounter for screening, unspecified: Secondary | ICD-10-CM

## 2023-02-26 LAB — GLUCOSE, POCT (MANUAL RESULT ENTRY)
POC Glucose: 258 mg/dl — AB (ref 70–99)
POC Glucose: 258 mg/dl — AB (ref 70–99)

## 2023-02-26 NOTE — Congregational Nurse Program (Signed)
  Dept: [phone]   Congregational Nurse Program Note  Date of Encounter: 02/26/2023  Past Medical History: Past Medical History:  Diagnosis Date   CHF (congestive heart failure)    Dehiscence of amputation stump    left great toe   Diabetes mellitus without complication 1999   Type II   Glaucoma 2015   Hyperlipidemia    Hypertension 2005   Left foot infection 02/07/2021   Osteomyelitis    Wears glasses     Encounter Details:  CNP Questionnaire - 02/26/23 1032       Questionnaire   Ask client: Do you give verbal consent for me to treat you today? Yes    Student Assistance N/A    Location [facility] Served  [facility]    Visit Setting with Client Organization    Patient Status Unhoused    Insurance Medicaid    Insurance/Financial Assistance Referral N/A    Medication Have Medication Insecurities    Medical Provider Yes    Screening Referrals Made N/A    Medical Referrals Made N/A    Medical Appointment Made N/A    Recently w/o PCP, now 1st time PCP visit completed due to CNs referral or appointment made N/A    Food Have Food Insecurities    Transportation Need transportation assistance    Housing/Utilities No permanent housing    Interpersonal Safety Do not feel safe at current residence    Interventions Advocate/Support;Educate;Case Management;Reviewed Medications   medication teaching provided by [facility] RN   Abnormal to Normal Screening Since Last CN Visit N/A    Screenings CN Performed Blood Pressure;Blood Glucose;Pulse Ox    Sent Client to Lab for: N/A    Did client attend any of the following based off CNs referral or appointments made? N/A    ED Visit Averted N/A    Life-Saving Intervention Made N/A           Client came to nurses office to receive assistance with medication management. Cathy RN provided medication teaching. Client had blood glucose and vitals checked while in office. Blood pressure (!) 156/77, pulse 62, SpO2 97 %. CBG 258 non fasting. Memory Argue encouragement, teaching and support.  Da Michelle W RN CN

## 2023-02-28 DIAGNOSIS — E1121 Type 2 diabetes mellitus with diabetic nephropathy: Secondary | ICD-10-CM

## 2023-03-04 ENCOUNTER — Other Ambulatory Visit: Payer: Self-pay | Admitting: Internal Medicine

## 2023-03-04 ENCOUNTER — Encounter: Payer: Self-pay | Admitting: *Deleted

## 2023-03-04 ENCOUNTER — Other Ambulatory Visit: Payer: Self-pay

## 2023-03-04 DIAGNOSIS — Z139 Encounter for screening, unspecified: Secondary | ICD-10-CM

## 2023-03-04 DIAGNOSIS — I5042 Chronic combined systolic (congestive) and diastolic (congestive) heart failure: Secondary | ICD-10-CM

## 2023-03-04 LAB — GLUCOSE, POCT (MANUAL RESULT ENTRY): POC Glucose: 183 mg/dl — AB (ref 70–99)

## 2023-03-04 NOTE — Congregational Nurse Program (Signed)
  Dept: [phone]   Congregational Nurse Program Note  Date of Encounter: 03/04/2023  Past Medical History: Past Medical History:  Diagnosis Date   CHF (congestive heart failure) (HCC)    Dehiscence of amputation stump (HCC)    left great toe   Diabetes mellitus without complication (HCC) 1999   Type II   Glaucoma 2015   Hyperlipidemia    Hypertension 2005   Left foot infection 02/07/2021   Osteomyelitis (HCC)    Wears glasses     Encounter Details:  CNP Questionnaire - 03/04/23 1005       Questionnaire   Ask client: Do you give verbal consent for me to treat you today? Yes    Student Assistance N/A    Location [facility] S[facility]    Visit Setting with Client Organization    Patient Status Unhoused    Insurance Medicaid    Insurance/Financial Assistance Referral N/A    Medication Have Medication Insecurities;Provided Medication Assistance    Medical Provider Yes    Screening Referrals Made N/A    Medical Referrals Made N/A    Medical Appointment Made N/A    Recently w/o PCP, now 1st time PCP visit completed due to CNs referral or appointment made N/A    Food N/A    Transportation N/A    Housing/Utilities No permanent housing    Interpersonal Safety N/A    Interventions Advocate/Support;Counsel;Educate;Case Management;Reviewed Medications    Abnormal to Normal Screening Since Last CN Visit N/A    Screenings CN Performed Blood Pressure;Blood Glucose;Pulse Ox    Sent Client to Lab for: N/A    Did client attend any of the following based off CNs referral or appointments made? N/A    ED Visit Averted N/A    Life-Saving Intervention Made N/A            Client came to see CN nurse to receive help with his medications. Educated client on each medication and assisted with pill container. Called [facility] pharmacy for refills. Medication will be ready later today. Client has all of his medication through Thursday. Discussed medication balance from pharmacy with  client. He reports he will pay $10 towards balance on Wednesday when he gets paid. Offered to pick up medication Wednesday if needed. Checked vitals and cbg. Blood pressure (!) 145/66, pulse (!) 58, SpO2 97 %. Client had not taken his medication this morning. Offered support and encouraged client to take his medications.  Daveon Arpino W RN CN

## 2023-03-05 ENCOUNTER — Other Ambulatory Visit: Payer: Self-pay

## 2023-03-05 ENCOUNTER — Inpatient Hospital Stay (HOSPITAL_COMMUNITY)
Admission: RE | Admit: 2023-03-05 | Discharge: 2023-03-05 | Disposition: A | Discharge status: Home / Self Care | Payer: Medicaid Other | Source: Ambulatory Visit

## 2023-03-05 MED ORDER — TORSEMIDE 20 MG PO TABS
20.0000 mg | ORAL_TABLET | Freq: Every day | ORAL | 0 refills | Status: DC
Start: 2023-03-05 — End: 2023-04-10
  Filled 2023-03-05: qty 90, 90d supply, fill #0

## 2023-03-05 NOTE — Telephone Encounter (Signed)
Requested Prescriptions  Pending Prescriptions Disp Refills   torsemide (DEMADEX) 20 MG tablet 90 tablet 0    Sig: Take 1 tablet (20 mg total) by mouth daily.     Cardiovascular:  Diuretics - Loop Failed - 03/04/2023 11:30 AM      Failed - Cr in normal range and within 180 days    Creatinine, Ser  Date Value Ref Range Status  02/11/2023 2.12 (H) 0.61 - 1.24 mg/dL Final         Failed - Mg Level in normal range and within 180 days    Magnesium  Date Value Ref Range Status  02/07/2021 1.8 1.7 - 2.4 mg/dL Final    Comment:    Performed at [facility], [address] [place], [place] [address]         Failed - Last BP in normal range    BP Readings from Last 1 Encounters:  03/04/23 (!) 145/66         Passed - K in normal range and within 180 days    Potassium  Date Value Ref Range Status  02/11/2023 4.1 3.5 - 5.1 mmol/L Final         Passed - Ca in normal range and within 180 days    Calcium  Date Value Ref Range Status  02/11/2023 9.2 8.9 - 10.3 mg/dL Final   Calcium, Ion  Date Value Ref Range Status  01/02/2021 1.15 1.15 - 1.40 mmol/L Final         Passed - Na in normal range and within 180 days    Sodium  Date Value Ref Range Status  02/11/2023 138 135 - 145 mmol/L Final  08/14/2022 144 134 - 144 mmol/L Final         Passed - Cl in normal range and within 180 days    Chloride  Date Value Ref Range Status  02/11/2023 104 98 - 111 mmol/L Final         Passed - Valid encounter within last 6 months    Recent Outpatient Visits           2 months ago Type 2 diabetes mellitus with diabetic nephropathy, without long-term current use of insulin (HCC)   [facility] Jonah Blue B, MD   4 months ago Type 2 diabetes mellitus with diabetic nephropathy, without long-term current use of insulin [facility])   [place] [facility] [place], [place], [place]   6 months ago Type 2 diabetes mellitus with diabetic  nephropathy, without long-term current use of insulin [facility])   [place] [facility] [name], MD   1 year ago Homeless   [place] [facility] [name], MD   1 year ago No-show for appointment   [facility] [name], MD       Future Appointments             In 4 months [name], Deloris Ping, MD [facility]

## 2023-03-06 ENCOUNTER — Encounter: Payer: Self-pay | Admitting: Physician Assistant

## 2023-03-06 NOTE — Progress Notes (Unsigned)
Pt here today, has not gotten his check yet.   He is upset by this, was on the phone w/ [employer], waiting for almost an hour.   This was frustrating to him.  He will have his money in a few days, there was confusion about his address.  He does not have the 10 dollars to pay for his medication.   He is not definitely going to pay for his meds when he gets the money, but he might.   He admits to depression, from the situation.   He is frustrated by his health and not having gotten an apartment.   Also not liking urinating multiple times a day.  Requested he speak to the CHF team about this.

## 2023-03-07 ENCOUNTER — Encounter: Payer: Self-pay | Admitting: *Deleted

## 2023-03-07 NOTE — Congregational Nurse Program (Signed)
  Dept: [phone]   Congregational Nurse Program Note  Date of Encounter: 03/07/2023  Past Medical History: Past Medical History:  Diagnosis Date   CHF (congestive heart failure) (HCC)    Dehiscence of amputation stump (HCC)    left great toe   Diabetes mellitus without complication (HCC) 1999   Type II   Glaucoma 2015   Hyperlipidemia    Hypertension 2005   Left foot infection 02/07/2021   Osteomyelitis (HCC)    Wears glasses     Encounter Details:  CNP Questionnaire - 03/07/23 1349       Questionnaire   Ask client: Do you give verbal consent for me to treat you today? Yes    Student Assistance N/A    [facility]    Visit Setting with Client Organization    Patient Status Unhoused    Insurance Medicaid    Insurance/Financial Assistance Referral N/A    Medication Have Medication Insecurities    Medical Provider Yes    Screening Referrals Made N/A    Medical Referrals Made N/A    Medical Appointment Made N/A    Recently w/o PCP, now 1st time PCP visit completed due to CNs referral or appointment made N/A    Food N/A    Transportation N/A    Housing/Utilities No permanent housing    Interpersonal Safety N/A    Interventions Advocate/Support    Abnormal to Normal Screening Since Last CN Visit N/A    Screenings CN Performed Blood Pressure    Sent Client to Lab for: N/A    Did client attend any of the following based off CNs referral or appointments made? N/A    ED Visit Averted N/A    Life-Saving Intervention Made N/A            Client came to nurse's office for blood pressure check and have bandage changed. Per visit with Theodore Demark PA yesterday, client has an abrasion below rt knee near amputated site. Changed and replaced dressing as requested by PA. No additional oozing, redness, or swelling. Client reports he will pay for his medications tomorrow and SW is aware and plans to assist client with pick up. Scott Greer W RN CN

## 2023-03-08 ENCOUNTER — Other Ambulatory Visit: Payer: Medicaid Other

## 2023-03-08 ENCOUNTER — Other Ambulatory Visit: Payer: Self-pay

## 2023-03-08 NOTE — Patient Instructions (Signed)
  Medicaid Managed Care   Unsuccessful Outreach Note  03/08/2023 Name: Scott Greer MRN: [id number] DOB: 1956-05-24  Referred by: [name], MD Reason for referral : High Risk Managed Medicaid (MM social work unsuccessful telephone outreach )   An unsuccessful telephone outreach was attempted today. The patient was referred to the case management team for assistance with care management and care coordination.   Follow Up Plan: The care management team will reach out to the patient again over the next 30-60 days.   Abelino Derrick, MHA [facility]  Managed [facility] Social Worker [phone]

## 2023-03-08 NOTE — Patient Outreach (Signed)
  Medicaid Managed Care   Unsuccessful Outreach Note  03/08/2023 Name: Scott Greer MRN: [id number] DOB: 06/10/1956  Referred by: Johnson, Deborah B, MD Reason for referral : High Risk Managed Medicaid (MM social work unsuccessful telephone outreach )   An unsuccessful telephone outreach was attempted today. The patient was referred to the case management team for assistance with care management and care coordination.   Follow Up Plan: The care management team will reach out to the patient again over the next 30-60 days.   Crosley Stejskal, BSW, MHA [place]  Managed Medicaid Social Worker [phone]  

## 2023-03-11 ENCOUNTER — Other Ambulatory Visit: Payer: Self-pay

## 2023-03-11 ENCOUNTER — Encounter: Payer: Self-pay | Admitting: *Deleted

## 2023-03-11 NOTE — Congregational Nurse Program (Signed)
  Dept: [phone]   Congregational Nurse Program Note  Date of Encounter: 03/11/2023  Past Medical History: Past Medical History:  Diagnosis Date   CHF (congestive heart failure) (HCC)    Dehiscence of amputation stump (HCC)    left great toe   Diabetes mellitus without complication (HCC) 1999   Type II   Glaucoma 2015   Hyperlipidemia    Hypertension 2005   Left foot infection 02/07/2021   Osteomyelitis (HCC)    Wears glasses     Encounter Details:  CNP Questionnaire - 03/11/23 1258       Questionnaire   Ask client: Do you give verbal consent for me to treat you today? Yes    Student Assistance N/A    Location Patient Served  [facility]    Visit Setting with Client Organization    Patient Status Unhoused    Insurance Medicaid    Insurance/Financial Assistance Referral N/A    Medication Have Medication Insecurities    Medical Provider Yes    Screening Referrals Made N/A    Medical Referrals Made N/A    Medical Appointment Made N/A    Recently w/o PCP, now 1st time PCP visit completed due to CNs referral or appointment made N/A    Food N/A    Transportation N/A    Housing/Utilities No permanent housing    Interpersonal Safety N/A    Interventions Advocate/Support    Abnormal to Normal Screening Since Last CN Visit N/A    Screenings CN Performed Blood Pressure;Pulse Ox    Sent Client to Lab for: N/A    Did client attend any of the following based off CNs referral or appointments made? N/A    ED Visit Averted N/A    Life-Saving Intervention Made N/A           Client came to nurse's office to have bandage on rt leg below knee above amputation changed. Abrasion is pink smaller than last visit with no redness or swelling. Replaced bandage and antibiotic ointment per Theodore Demark PA request. Checked vitals. Blood pressure 122/67, pulse 67, SpO2 97 %. Client has not given SW money for his medications as discussed last week. Talked about importance of taking medications  as prescribed. Client reports he will give her the money today. He plans to return tomorrow to see CN and have assistance with medication pill box. Offered support and encouragement. Client took noon medication while seeing CN. CBG was not completed as client had recently ate lunch. Eshan Trupiano W RN CN

## 2023-03-13 ENCOUNTER — Encounter: Payer: Self-pay | Admitting: *Deleted

## 2023-03-13 NOTE — Congregational Nurse Program (Signed)
  Dept: [phone]   Congregational Nurse Program Note  Date of Encounter: 03/13/2023  Past Medical History: Past Medical History:  Diagnosis Date   CHF (congestive heart failure) (HCC)    Dehiscence of amputation stump (HCC)    left great toe   Diabetes mellitus without complication (HCC) 1999   Type II   Glaucoma 2015   Hyperlipidemia    Hypertension 2005   Left foot infection 02/07/2021   Osteomyelitis (HCC)    Wears glasses     Encounter Details:  CNP Questionnaire - 03/13/23 1255       Questionnaire   Ask client: Do you give verbal consent for me to treat you today? Yes    Student Assistance N/A    Location Patient Served  [facility]    Visit Setting with Client Organization    Patient Status Unhoused    Insurance Medicaid    Insurance/Financial Assistance Referral N/A    Medication N/A    Medical Provider Yes    Screening Referrals Made N/A    Medical Referrals Made N/A    Medical Appointment Made N/A    Recently w/o PCP, now 1st time PCP visit completed due to CNs referral or appointment made N/A    Food N/A    Transportation N/A    Housing/Utilities No permanent housing    Interpersonal Safety N/A    Interventions Advocate/Support    Abnormal to Normal Screening Since Last CN Visit N/A    Screenings CN Performed Blood Pressure;Pulse Ox    Sent Client to Lab for: N/A    Did client attend any of the following based off CNs referral or appointments made? N/A    ED Visit Averted N/A    Life-Saving Intervention Made N/A           Client seen today requesting a MD visit at [facility] with Dr Delford Field. Checked vitals. Blood pressure 106/69, pulse 71, SpO2 93 %. Client if following up with MD for an abrasion on rt leg below knee above amputation site. Client reports he is suppose to get a new motorized wheelchair and is frustrated with the wait. He c/o sore hands and fingers while using his wheel chair. Offered support and encouragement. Gave assessment paper to MD  Nira Conn  RN CN

## 2023-03-14 ENCOUNTER — Telehealth: Payer: Self-pay

## 2023-03-14 NOTE — Telephone Encounter (Signed)
Fax received from [facility] stating the patient's power wheelchair has been approved.   Message sent to Zenia Resides, [employer] inquiring if she is aware of this approval and when the patient may be able to receive it.   I tried to call the patient [phone]  to inform him of the fax that as received from [facility].  The phone just rang with no option for voicemail. I called Waynetta Sandy, RN and left a message for her that [facility] has approved the power wheelchair

## 2023-03-19 NOTE — Telephone Encounter (Signed)
The following message was received from [facility] [employer]:  His chair has been ordered; but they don't have an anticipated date that it is expected yet.

## 2023-03-19 NOTE — Telephone Encounter (Signed)
Pt is returning Jane's call and requesting a callback.  Please advise.

## 2023-03-19 NOTE — Telephone Encounter (Signed)
Excellent news!

## 2023-03-19 NOTE — Telephone Encounter (Signed)
Call returned to patient, left message informing him that the power chair has been approved by his insurance and [employer] has placed the order but they do not have a delivery date yet.

## 2023-03-20 ENCOUNTER — Encounter: Payer: Self-pay | Admitting: *Deleted

## 2023-03-20 DIAGNOSIS — Z139 Encounter for screening, unspecified: Secondary | ICD-10-CM

## 2023-03-20 LAB — GLUCOSE, POCT (MANUAL RESULT ENTRY): POC Glucose: 150 mg/dl — AB (ref 70–99)

## 2023-03-20 NOTE — Congregational Nurse Program (Signed)
  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 Yes    Student Assistance N/A    Location Patient Served  [facility]    Visit Setting with Client Organization    Patient Status Unhoused    Insurance Medicaid    Insurance/Financial Assistance Referral N/A    Medication Provided Medication Assistance;Have Medication Insecurities    Medical Provider Yes    Screening Referrals Made N/A    Medical Referrals Made N/A    Medical Appointment Made N/A    Recently w/o PCP, now 1st time PCP visit completed due to CNs referral or appointment made N/A    Food N/A    Transportation N/A    Housing/Utilities No permanent housing    Interpersonal Safety N/A    Interventions Advocate/Support;Educate;Reviewed Medications    Abnormal to Normal Screening Since Last CN Visit N/A    Screenings CN Performed Blood Pressure;Blood Glucose;Pulse Ox    Sent Client to Lab for: N/A    Did client attend any of the following based off CNs referral or appointments made? N/A    ED Visit Averted N/A    Life-Saving Intervention Made N/A            Client came to nurse's office for assistance with his medication and vitals check. Blood pressure 138/72, pulse 66, SpO2 98 %. Cbg 150. Educated client on each medication and assisted with pill box. Offered support and encouragement. Client's abrasion on rt lower leg has a scab and shows no signs of infection. Discussed updated  wheelchair information received. Client reports he is discussing housing options with CM today. Tiffanni Scarfo W RN CN

## 2023-03-21 LAB — GLUCOSE, POCT (MANUAL RESULT ENTRY): POC Glucose: 107 mg/dl — AB (ref 70–99)

## 2023-03-21 NOTE — Congregational Nurse Program (Signed)
  Dept: [phone]   Congregational Nurse Program Note  Date of Encounter:  03/12/2023  Clinic visit to check blood pressure and blood glucose.  Blood pressure 130/65, pulse 58 and regular.  Blood glucose 170 AC lunch, discussed necessity of taking medications for diabetes each day as prescribed. 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 Yes    Student Assistance N/A    Location Patient Served  [facility]    Visit Setting with Client Organization    Patient Status Unhoused    Insurance Medicaid    Insurance/Financial Assistance Referral N/A    Medication Have Medication Insecurities    Medical Provider Yes    Screening Referrals Made N/A    Medical Referrals Made N/A    Medical Appointment Made N/A    Recently w/o PCP, now 1st time PCP visit completed due to CNs referral or appointment made N/A    Food N/A    Transportation N/A    Housing/Utilities No permanent housing    Interpersonal Safety N/A    Interventions Advocate/Support;Case Management;Counsel;Educate    Abnormal to Normal Screening Since Last CN Visit N/A    Screenings CN Performed Blood Pressure;Pulse Ox;Blood Glucose    Sent Client to Lab for: N/A    Did client attend any of the following based off CNs referral or appointments made? N/A    ED Visit Averted N/A    Life-Saving Intervention Made N/A

## 2023-03-25 ENCOUNTER — Ambulatory Visit (HOSPITAL_COMMUNITY)
Admission: RE | Admit: 2023-03-25 | Discharge: 2023-03-25 | Disposition: A | Discharge status: Home / Self Care | Payer: Medicaid Other | Source: Ambulatory Visit | Attending: Internal Medicine | Admitting: Internal Medicine

## 2023-03-25 ENCOUNTER — Telehealth: Payer: Self-pay | Admitting: *Deleted

## 2023-03-25 ENCOUNTER — Telehealth (HOSPITAL_COMMUNITY): Payer: Self-pay

## 2023-03-25 DIAGNOSIS — E785 Hyperlipidemia, unspecified: Secondary | ICD-10-CM | POA: Insufficient documentation

## 2023-03-25 DIAGNOSIS — I110 Hypertensive heart disease with heart failure: Secondary | ICD-10-CM | POA: Diagnosis not present

## 2023-03-25 DIAGNOSIS — I5022 Chronic systolic (congestive) heart failure: Secondary | ICD-10-CM | POA: Diagnosis present

## 2023-03-25 DIAGNOSIS — E119 Type 2 diabetes mellitus without complications: Secondary | ICD-10-CM | POA: Diagnosis not present

## 2023-03-25 LAB — ECHOCARDIOGRAM COMPLETE
Area-P 1/2: 2.64 cm2
S' Lateral: 3.3 cm

## 2023-03-25 NOTE — Telephone Encounter (Addendum)
Pt aware, agreeable, and verbalized understanding    ----- Message from Laurey Morale, MD sent at 03/25/2023  4:26 PM EDT ----- LV EF normal.  RV normal.

## 2023-03-25 NOTE — Telephone Encounter (Signed)
Contacted [employer] as client has broken leg rests on wheelchair. Contacted to replace/repair while waiting on electric chair. Per call to Elease Hashimoto, representative will call client this afternoon to schedule delivery of electric chair.

## 2023-03-26 ENCOUNTER — Other Ambulatory Visit: Payer: Self-pay

## 2023-03-26 DIAGNOSIS — E1121 Type 2 diabetes mellitus with diabetic nephropathy: Secondary | ICD-10-CM

## 2023-03-26 LAB — GLUCOSE, POCT (MANUAL RESULT ENTRY): POC Glucose: 205 mg/dl — AB (ref 70–99)

## 2023-03-26 NOTE — Congregational Nurse Program (Signed)
  Dept: [phone]   Congregational Nurse Program Note  Date of Encounter: 03/26/2023  Clinic visit to check blood pressure and blood glucose.  BP 100/58, pulse 63, O2 Sat 97%.  Blood glucose 205 AC lunch.  States he had been eating candy after breakfast.  Discussed avoiding foods with high amount of sugar.  Sent message to PCP regarding low blood pressure.  Reviewed medications, purpose for medicine and assisted with filling pillbox. 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 Yes    Student Assistance N/A    Location Patient Served  [facility]    Visit Setting with Client Organization    Patient Status Unhoused    Insurance Medicaid    Insurance/Financial Assistance Referral N/A    Medication Have Medication Insecurities    Medical Provider Yes    Screening Referrals Made N/A    Medical Referrals Made N/A    Medical Appointment Made N/A    Recently w/o PCP, now 1st time PCP visit completed due to CNs referral or appointment made N/A    Food Have Food Insecurities    Transportation Need transportation assistance    Housing/Utilities No permanent housing    Interpersonal Safety Do not feel safe at current residence    Interventions Advocate/Support;Educate;Reviewed Medications;Spiritual Care;Counsel;Case Management    Abnormal to Normal Screening Since Last CN Visit N/A    Screenings CN Performed Blood Pressure;Blood Glucose;Pulse Ox    Sent Client to Lab for: N/A    Did client attend any of the following based off CNs  referral or appointments made? N/A    ED Visit Averted N/A    Life-Saving Intervention Made N/A

## 2023-03-27 ENCOUNTER — Encounter: Payer: Self-pay | Admitting: *Deleted

## 2023-03-27 NOTE — Congregational Nurse Program (Signed)
  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 Yes    Student Assistance N/A    Location Patient Served  [facility]    Visit Setting with Client Organization    Patient Status Unhoused    Insurance Medicaid    Insurance/Financial Assistance Referral N/A    Medication N/A    Medical Provider Yes    Screening Referrals Made N/A    Medical Referrals Made N/A    Medical Appointment Made N/A    Food N/A    Transportation N/A    Housing/Utilities No permanent housing    Interpersonal Safety N/A    Interventions Advocate/Support    Abnormal to Normal Screening Since Last CN Visit Blood Pressure    Screenings CN Performed Blood Pressure    Sent Client to Lab for: N/A    Did client attend any of the following based off CNs referral or appointments made? N/A    ED Visit Averted N/A    Life-Saving Intervention Made N/A            Rechecked blood pressure from prior CN visit yesterday. Blood pressure 119/67, pulse 68. Client has not heard from wheelchair assistance [employer]. Client plans to come back to nurse's office tomorrow for additional assistance calling [employer]. Charis Juliana W RN CN

## 2023-03-28 ENCOUNTER — Encounter: Payer: Self-pay | Admitting: *Deleted

## 2023-03-28 NOTE — Congregational Nurse Program (Signed)
  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 Yes    Student Assistance N/A    Location Patient Served  [facility]    Visit Setting with Client Organization;Phone/Text/Email    Patient Status Unhoused    Insurance Medicaid    Insurance/Financial Assistance Referral N/A    Medication N/A    Medical Provider Yes    Screening Referrals Made N/A    Medical Referrals Made N/A    Medical Appointment Made Other    Recently w/o PCP, now 1st time PCP visit completed due to CNs referral or appointment made N/A    Food N/A    Transportation N/A    Housing/Utilities No permanent housing    Interpersonal Safety N/A    Interventions Advocate/Support;[facility]    Abnormal to Normal Screening Since Last CN Visit N/A    Screenings CN Performed Blood Pressure    Sent Client to Lab for: N/A    Did client attend any of the following based off CNs referral or appointments made? N/A    ED Visit Averted N/A    Life-Saving Intervention Made N/A            Client reported he had not received a call or wheelchair from [employer]. Assisted client in calling [employer] and spoke with DeeDee. She reported leaving client a message about scheduling an appt and client never received message. While on the phone, a scheduled delivery was made for May 30th between 8:30 and 12:00. Checked vitals while client was in office. Blood pressure 116/64, pulse 71. No other  requests or concerns voiced today. Anarely Nicholls W RN CN

## 2023-04-02 DIAGNOSIS — E1121 Type 2 diabetes mellitus with diabetic nephropathy: Secondary | ICD-10-CM

## 2023-04-02 LAB — HM DIABETES EYE EXAM

## 2023-04-02 LAB — GLUCOSE, POCT (MANUAL RESULT ENTRY): POC Glucose: 219 mg/dl — AB (ref 70–99)

## 2023-04-02 NOTE — Congregational Nurse Program (Signed)
  Dept: [phone]   Congregational Nurse Program Note  Date of Encounter: 04/02/2023  Clinic visit to check blood pressure and blood glucose, BP 109/64, pulse 66 and regular, O2 Sat 95%.  Blood glucose 219 PC breakfast.  Discussed different types of carbohydrates and need to limit the number of carbohydrates at each meal to keep blood glucose at more normal levels.  Reviewed all of medications and assisted with filling weekly pillbox.  Discussed recent fall from wheelchair and need to keep appointment to get new motorized wheelchair. 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 Yes    Student Assistance N/A    Location Patient Served  [facility]    Visit Setting with Client Organization    Patient Status Unhoused    Insurance Medicaid    Insurance/Financial Assistance Referral N/A    Medication N/A    Medical Provider Yes    Screening Referrals Made N/A    Medical Referrals Made N/A    Medical Appointment Made N/A    Recently w/o PCP, now 1st time PCP visit completed due to CNs referral or appointment made N/A    Food N/A    Transportation N/A    Housing/Utilities No permanent housing    Interpersonal Safety N/A    Interventions Advocate/Support;Educate;Counsel;Case Management;Reviewed Medications    Abnormal to Normal Screening Since Last CN Visit N/A    Screenings CN Performed Blood Pressure;Pulse Ox;Cholesterol;Blood Glucose    Sent Client to Lab for: N/A    Did client attend any of the following based off CNs referral or  appointments made? N/A    ED Visit Averted N/A    Life-Saving Intervention Made N/A

## 2023-04-03 ENCOUNTER — Encounter: Payer: Self-pay | Admitting: Physician Assistant
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
## 2023-04-04 ENCOUNTER — Encounter: Payer: Self-pay | Admitting: *Deleted

## 2023-04-04 NOTE — Congregational Nurse Program (Signed)
  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 Yes    Student Assistance N/A    Location Patient Served  [facility]    Visit Setting with Client Organization    Patient Status Unhoused    Insurance Medicaid    Insurance/Financial Assistance Referral N/A    Medication N/A    Medical Provider Yes    Screening Referrals Made N/A    Medical Referrals Made N/A    Medical Appointment Made N/A    Recently w/o PCP, now 1st time PCP visit completed due to CNs referral or appointment made N/A    Food N/A    Transportation N/A    Housing/Utilities No permanent housing    Interpersonal Safety N/A    Interventions Advocate/Support;Reviewed Medications    Abnormal to Normal Screening Since Last CN Visit N/A    Screenings CN Performed Blood Pressure    Sent Client to Lab for: N/A    Did client attend any of the following based off CNs referral or appointments made? N/A    ED Visit Averted N/A    Life-Saving Intervention Made N/A            Client came to nurse's office for blood pressure check. Blood pressure 124/69, pulse 64. He has an eye appointment tomorrow and needed a copy of his current medications. Made copy for latest hospital discharge summary and gave to client to take to appointment. No other concerns or requests voiced at this time. Akane Tessier W RN CN

## 2023-04-08 ENCOUNTER — Encounter (HOSPITAL_COMMUNITY): Payer: Self-pay | Admitting: Internal Medicine

## 2023-04-08 ENCOUNTER — Encounter: Payer: Self-pay | Admitting: *Deleted

## 2023-04-08 ENCOUNTER — Emergency Department (HOSPITAL_COMMUNITY): Payer: Medicaid Other

## 2023-04-08 ENCOUNTER — Other Ambulatory Visit: Payer: Self-pay

## 2023-04-08 ENCOUNTER — Inpatient Hospital Stay (HOSPITAL_COMMUNITY)
Admission: EM | Admit: 2023-04-08 | Discharge: 2023-04-10 | DRG: 683 | Disposition: A | Discharge status: Home / Self Care | Payer: Medicaid Other | Attending: Internal Medicine | Admitting: Internal Medicine

## 2023-04-08 DIAGNOSIS — Z79899 Other long term (current) drug therapy: Secondary | ICD-10-CM

## 2023-04-08 DIAGNOSIS — D631 Anemia in chronic kidney disease: Secondary | ICD-10-CM | POA: Diagnosis present

## 2023-04-08 DIAGNOSIS — Z91030 Bee allergy status: Secondary | ICD-10-CM

## 2023-04-08 DIAGNOSIS — R296 Repeated falls: Secondary | ICD-10-CM | POA: Diagnosis present

## 2023-04-08 DIAGNOSIS — I5032 Chronic diastolic (congestive) heart failure: Secondary | ICD-10-CM

## 2023-04-08 DIAGNOSIS — E860 Dehydration: Secondary | ICD-10-CM

## 2023-04-08 DIAGNOSIS — N179 Acute kidney failure, unspecified: Secondary | ICD-10-CM | POA: Diagnosis not present

## 2023-04-08 DIAGNOSIS — Z1152 Encounter for screening for COVID-19: Secondary | ICD-10-CM

## 2023-04-08 DIAGNOSIS — E1142 Type 2 diabetes mellitus with diabetic polyneuropathy: Secondary | ICD-10-CM

## 2023-04-08 DIAGNOSIS — N3000 Acute cystitis without hematuria: Secondary | ICD-10-CM | POA: Diagnosis not present

## 2023-04-08 DIAGNOSIS — Z7982 Long term (current) use of aspirin: Secondary | ICD-10-CM

## 2023-04-08 DIAGNOSIS — Z862 Personal history of diseases of the blood and blood-forming organs and certain disorders involving the immune mechanism: Secondary | ICD-10-CM

## 2023-04-08 DIAGNOSIS — I959 Hypotension, unspecified: Secondary | ICD-10-CM | POA: Diagnosis present

## 2023-04-08 DIAGNOSIS — I130 Hypertensive heart and chronic kidney disease with heart failure and stage 1 through stage 4 chronic kidney disease, or unspecified chronic kidney disease: Secondary | ICD-10-CM | POA: Diagnosis present

## 2023-04-08 DIAGNOSIS — N189 Chronic kidney disease, unspecified: Secondary | ICD-10-CM

## 2023-04-08 DIAGNOSIS — E785 Hyperlipidemia, unspecified: Secondary | ICD-10-CM | POA: Diagnosis present

## 2023-04-08 DIAGNOSIS — E782 Mixed hyperlipidemia: Secondary | ICD-10-CM

## 2023-04-08 DIAGNOSIS — I5042 Chronic combined systolic (congestive) and diastolic (congestive) heart failure: Secondary | ICD-10-CM

## 2023-04-08 DIAGNOSIS — H409 Unspecified glaucoma: Secondary | ICD-10-CM | POA: Diagnosis present

## 2023-04-08 DIAGNOSIS — R531 Weakness: Secondary | ICD-10-CM

## 2023-04-08 DIAGNOSIS — E1122 Type 2 diabetes mellitus with diabetic chronic kidney disease: Secondary | ICD-10-CM | POA: Diagnosis present

## 2023-04-08 DIAGNOSIS — W19XXXA Unspecified fall, initial encounter: Secondary | ICD-10-CM

## 2023-04-08 DIAGNOSIS — Z139 Encounter for screening, unspecified: Secondary | ICD-10-CM

## 2023-04-08 DIAGNOSIS — Z89511 Acquired absence of right leg below knee: Secondary | ICD-10-CM

## 2023-04-08 DIAGNOSIS — Y92009 Unspecified place in unspecified non-institutional (private) residence as the place of occurrence of the external cause: Secondary | ICD-10-CM

## 2023-04-08 DIAGNOSIS — I10 Essential (primary) hypertension: Secondary | ICD-10-CM

## 2023-04-08 DIAGNOSIS — N1832 Chronic kidney disease, stage 3b: Secondary | ICD-10-CM | POA: Diagnosis present

## 2023-04-08 DIAGNOSIS — F17290 Nicotine dependence, other tobacco product, uncomplicated: Secondary | ICD-10-CM | POA: Diagnosis present

## 2023-04-08 DIAGNOSIS — E119 Type 2 diabetes mellitus without complications: Secondary | ICD-10-CM

## 2023-04-08 DIAGNOSIS — Z7984 Long term (current) use of oral hypoglycemic drugs: Secondary | ICD-10-CM

## 2023-04-08 DIAGNOSIS — Z833 Family history of diabetes mellitus: Secondary | ICD-10-CM

## 2023-04-08 DIAGNOSIS — Z823 Family history of stroke: Secondary | ICD-10-CM

## 2023-04-08 DIAGNOSIS — Z89512 Acquired absence of left leg below knee: Secondary | ICD-10-CM

## 2023-04-08 DIAGNOSIS — Z20828 Contact with and (suspected) exposure to other viral communicable diseases: Secondary | ICD-10-CM | POA: Diagnosis present

## 2023-04-08 DIAGNOSIS — Z5901 Sheltered homelessness: Secondary | ICD-10-CM

## 2023-04-08 DIAGNOSIS — N1831 Chronic kidney disease, stage 3a: Secondary | ICD-10-CM

## 2023-04-08 LAB — URINALYSIS, W/ REFLEX TO CULTURE (INFECTION SUSPECTED)
Bilirubin Urine: NEGATIVE
Glucose, UA: 150 mg/dL — AB
Hgb urine dipstick: NEGATIVE
Ketones, ur: NEGATIVE mg/dL
Nitrite: NEGATIVE
Protein, ur: 100 mg/dL — AB
Specific Gravity, Urine: 1.01 (ref 1.005–1.030)
WBC, UA: >50 WBC/hpf (ref 0–5)
pH: 5 (ref 5.0–8.0)

## 2023-04-08 LAB — RESP PANEL BY RT-PCR (RSV, FLU A&B, COVID)  RVPGX2
Influenza A by PCR: NEGATIVE
Influenza B by PCR: NEGATIVE
Resp Syncytial Virus by PCR: NEGATIVE
SARS Coronavirus 2 by RT PCR: NEGATIVE

## 2023-04-08 LAB — CBC
HCT: 28.3 % — ABNORMAL LOW (ref 39.0–52.0)
Hemoglobin: 9 g/dL — ABNORMAL LOW (ref 13.0–17.0)
MCH: 27.5 pg (ref 26.0–34.0)
MCHC: 31.8 g/dL (ref 30.0–36.0)
MCV: 86.5 fL (ref 80.0–100.0)
Platelets: 273 10*3/uL (ref 150–400)
RBC: 3.27 MIL/uL — ABNORMAL LOW (ref 4.22–5.81)
RDW: 15.1 % (ref 11.5–15.5)
WBC: 9 10*3/uL (ref 4.0–10.5)
nRBC: 0 % (ref 0.0–0.2)

## 2023-04-08 LAB — MAGNESIUM: Magnesium: 2.3 mg/dL (ref 1.7–2.4)

## 2023-04-08 LAB — RAPID URINE DRUG SCREEN, HOSP PERFORMED
Amphetamines: NOT DETECTED
Barbiturates: NOT DETECTED
Benzodiazepines: NOT DETECTED
Cocaine: POSITIVE — AB
Opiates: NOT DETECTED
Tetrahydrocannabinol: NOT DETECTED

## 2023-04-08 LAB — BASIC METABOLIC PANEL
Anion gap: 12 (ref 5–15)
BUN: 59 mg/dL — ABNORMAL HIGH (ref 8–23)
CO2: 22 mmol/L (ref 22–32)
Calcium: 9 mg/dL (ref 8.9–10.3)
Chloride: 102 mmol/L (ref 98–111)
Creatinine, Ser: 3 mg/dL — ABNORMAL HIGH (ref 0.61–1.24)
GFR, Estimated: 22 mL/min — ABNORMAL LOW (ref >60)
Glucose, Bld: 207 mg/dL — ABNORMAL HIGH (ref 70–99)
Potassium: 4.3 mmol/L (ref 3.5–5.1)
Sodium: 136 mmol/L (ref 135–145)

## 2023-04-08 LAB — CK: Total CK: 227 U/L (ref 49–397)

## 2023-04-08 LAB — GLUCOSE, POCT (MANUAL RESULT ENTRY): POC Glucose: 293 mg/dl — AB (ref 70–99)

## 2023-04-08 MED ORDER — FERROUS SULFATE 325 (65 FE) MG PO TABS
325.0000 mg | ORAL_TABLET | Freq: Every day | ORAL | Status: DC
  Administered 2023-04-09 – 2023-04-10 (×2): 325 mg via ORAL
  Filled 2023-04-08 (×2): qty 1

## 2023-04-08 MED ORDER — LACTATED RINGERS IV BOLUS
500.0000 mL | Freq: Once | INTRAVENOUS | Status: AC
  Administered 2023-04-08: 500 mL via INTRAVENOUS

## 2023-04-08 MED ORDER — LACTATED RINGERS IV SOLN: INTRAVENOUS | Status: AC

## 2023-04-08 MED ORDER — ACETAMINOPHEN 325 MG PO TABS
650.0000 mg | ORAL_TABLET | Freq: Four times a day (QID) | ORAL | Status: DC | PRN
  Administered 2023-04-09 (×2): 650 mg via ORAL
  Filled 2023-04-08 (×2): qty 2

## 2023-04-08 MED ORDER — SODIUM CHLORIDE 0.9 % IV SOLN
1.0000 g | INTRAVENOUS | Status: DC
  Administered 2023-04-08 – 2023-04-09 (×2): 1 g via INTRAVENOUS
  Filled 2023-04-08 (×2): qty 10

## 2023-04-08 MED ORDER — ASPIRIN 81 MG PO CHEW
81.0000 mg | CHEWABLE_TABLET | Freq: Every day | ORAL | Status: DC
  Administered 2023-04-09 – 2023-04-10 (×2): 81 mg via ORAL
  Filled 2023-04-08 (×2): qty 1

## 2023-04-08 MED ORDER — INSULIN ASPART 100 UNIT/ML IJ SOLN
0.0000 [IU] | Freq: Three times a day (TID) | INTRAMUSCULAR | Status: DC
  Administered 2023-04-09 (×2): 2 [IU] via SUBCUTANEOUS
  Administered 2023-04-10 (×2): 1 [IU] via SUBCUTANEOUS

## 2023-04-08 MED ORDER — ATORVASTATIN CALCIUM 10 MG PO TABS
10.0000 mg | ORAL_TABLET | Freq: Every evening | ORAL | Status: DC
  Administered 2023-04-08 – 2023-04-09 (×2): 10 mg via ORAL
  Filled 2023-04-08 (×2): qty 1

## 2023-04-08 MED ORDER — HEPARIN SODIUM (PORCINE) 5000 UNIT/ML IJ SOLN
5000.0000 [IU] | Freq: Three times a day (TID) | INTRAMUSCULAR | Status: DC
  Administered 2023-04-08 – 2023-04-10 (×5): 5000 [IU] via SUBCUTANEOUS
  Filled 2023-04-08 (×5): qty 1

## 2023-04-08 MED ORDER — MELATONIN 3 MG PO TABS
3.0000 mg | ORAL_TABLET | Freq: Every evening | ORAL | Status: DC | PRN
  Administered 2023-04-09: 3 mg via ORAL
  Filled 2023-04-08: qty 1

## 2023-04-08 MED ORDER — ONDANSETRON HCL 4 MG/2ML IJ SOLN: 4.0000 mg | Freq: Four times a day (QID) | INTRAMUSCULAR | Status: DC | PRN

## 2023-04-08 MED ORDER — ACETAMINOPHEN 650 MG RE SUPP: 650.0000 mg | Freq: Four times a day (QID) | RECTAL | Status: DC | PRN

## 2023-04-08 NOTE — ED Notes (Signed)
Patient transported to CT 

## 2023-04-08 NOTE — ED Provider Notes (Signed)
[place] EMERGENCY DEPARTMENT AT [facility] Provider Note   CSN: [id number] Arrival date & time: 04/08/23  1421     History  Chief Complaint  Patient presents with   Scott Greer    Scott Greer is a 67 y.o. male.   Fall       Home Medications Prior to Admission medications   Medication Sig Start Date End Date Taking? Authorizing Provider  amiodarone (PACERONE) 200 MG tablet Take 0.5 tablets (100 mg total) by mouth daily. 02/11/23   [name], FNP  aspirin (ASPIRIN LOW DOSE) 81 MG chewable tablet CHEW ONE TABLET BY MOUTH ONCE DAILY Needs appointment for further refills 10/25/22   Georgian Co M, PA-C  atorvastatin (LIPITOR) 10 MG tablet Take 1 tablet (10 mg total) by mouth every evening. 12/14/22   [name], MD  carvedilol (COREG) 3.125 MG tablet Take 1 tablet (3.125 mg total) by mouth 2 (two) times daily with a meal. 12/14/22   [name], MD  digoxin (LANOXIN) 0.125 MG tablet Take 1 tablet (0.125 mg total) by mouth every evening with supper. 12/14/22   [name], MD  empagliflozin (JARDIANCE) 10 MG TABS tablet Take 1 tablet (10 mg total) by mouth daily at 12 noon. 12/14/22   [name], MD  ferrous sulfate (FEROSUL) 325 (65 FE) MG tablet TAKE ONE TABLET BY MOUTH EVERY MORNING WITH BREAKFAST 08/15/22   [name], MD  gabapentin (NEURONTIN) 300 MG capsule Take 1 capsule (300 mg total) by mouth 2 (two) times daily. 01/17/23   [name], MD  glimepiride (AMARYL) 1 MG tablet Take 1 tablet (1 mg total) by mouth daily with breakfast. 12/21/22   [name], MD  isosorbide-hydrALAZINE (BIDIL) 20-37.5 MG tablet Take 1 tablet by mouth 2 (two) times daily. 12/14/22   [name], MD  sacubitril-valsartan (ENTRESTO) 24-26 MG Take 1 tablet by mouth 2 (two) times daily. 12/14/22   [name], MD  spironolactone (ALDACTONE) 25 MG tablet Take 1 tablet (25 mg total) by mouth every morning. 12/14/22   [name],  MD  torsemide (DEMADEX) 20 MG tablet Take 1 tablet (20 mg total) by mouth daily. 03/05/23   [name], MD      Allergies    Bee venom    Review of Systems   Review of Systems  Physical Exam Updated Vital Signs BP (!) 91/54 (BP Location: Right Arm)   Pulse 70   Temp 98.4 F (36.9 C) (Oral)   Resp 14   SpO2 100%  Physical Exam  ED Results / Procedures / Treatments   Labs (all labs ordered are listed, but only abnormal results are displayed) Labs Reviewed  CBC - Abnormal; Notable for the following components:      Result Value   RBC 3.27 (*)    Hemoglobin 9.0 (*)    HCT 28.3 (*)    All other components within normal limits  BASIC METABOLIC PANEL - Abnormal; Notable for the following components:   Glucose, Bld 207 (*)    BUN 59 (*)    Creatinine, Ser 3.00 (*)    GFR, Estimated 22 (*)    All other components within normal limits  RESP PANEL BY RT-PCR (RSV, FLU A&B, COVID)  [id number]  RAPID URINE DRUG SCREEN, HOSP PERFORMED    EKG None  Radiology No results found.  Procedures Procedures  {Document cardiac monitor, telemetry assessment procedure when appropriate:1}  Medications Ordered  in ED Medications - No data to display  ED Course/ Medical Decision Making/ A&P   {   Click here for ABCD2, HEART and other calculatorsREFRESH Note before signing :1}                          Medical Decision Making  ***  {Document critical care time when appropriate:1} {Document review of labs and clinical decision tools ie heart score, Chads2Vasc2 etc:1}  {Document your independent review of radiology images, and any outside records:1} {Document your discussion with family members, caretakers, and with consultants:1} {Document social determinants of health affecting pt's care:1} {Document your decision making why or why not admission, treatments were needed:1} Final Clinical Impression(s) / ED Diagnoses Final diagnoses:  None    Rx / DC Orders ED Discharge  Orders     None

## 2023-04-08 NOTE — ED Notes (Signed)
ED TO INPATIENT HANDOFF REPORT  ED Nurse Name and Phone #: Lanora Manis [phone]  S Name/Age/Gender Scott Greer 67 y.o. male Room/Bed: 046C/046C  Code Status   Code Status: Full Code  Home/SNF/Other Nursing Home Patient oriented to: self, place, time, and situation Is this baseline? Yes   Triage Complete: Triage complete  Chief Complaint Acute renal failure superimposed on stage 3a chronic kidney disease (HCC) [N17.9, N18.31]  Triage Note Pt to ED via EMS from urban [employer]. Pt states he had 2 falls yesterday out of wheelchair. Pt reports hitting lip and back of head. Pt is bilateral  BKA. Pt c/o headache and neck pain. Pt denies blood thinners. Pt denies LOC. Pt is also requesting assistance to be placed in nursing home.   EMS Vitals: 116/60 72 HR 16 RR 96% RA 293 CBG   Allergies Allergies  Allergen Reactions   Bee Venom Hives, Itching and Swelling    Level of Care/Admitting Diagnosis ED Disposition     ED Disposition  Admit   Condition  --   Comment  Hospital Area: [facility] [[id number]]  Level of Care: Telemetry Medical [[id number]]  May place patient in observation at [facility] or [facility] if equivalent level of care is available:: No  Covid Evaluation: Asymptomatic - no recent exposure (last 10 days) testing not required  Diagnosis: Acute renal failure superimposed on stage 3a chronic kidney disease [facility]) [[id number]]  Admitting Physician: Angie Fava [[id number]]  Attending Physician: Angie Fava [[id number]]          B Medical/Surgery History Past Medical History:  Diagnosis Date   CHF (congestive heart failure) (HCC)    Dehiscence of amputation stump (HCC)    left great toe   Diabetes mellitus without complication (HCC) 1999   Type II   Glaucoma 2015   Hyperlipidemia    Hypertension 2005   Left foot infection 02/07/2021   Osteomyelitis (HCC)    Wears glasses    Past Surgical History:  Procedure  Laterality Date   AMPUTATION Left 06/05/2019   Procedure: LEFT GREAT TOE AMPUTATION;  Surgeon: [name], MD;  Location: [facility] OR;  Service: Orthopedics;  Laterality: Left;   AMPUTATION Left 07/10/2019   Procedure: LEFT FOOT 1ST RAY AMPUTATION;  Surgeon: [name], MD;  Location: [facility] OR;  Service: Orthopedics;  Laterality: Left;   AMPUTATION Right 05/25/2020   Procedure: RIGHT GREAT TOE AMPUTATION;  Surgeon: [name], MD;  Location: [facility] OR;  Service: Orthopedics;  Laterality: Right;   AMPUTATION Left 01/25/2021   Procedure: LEFT 2ND TOE AMPUTATION;  Surgeon: [name], MD;  Location: [facility] OR;  Service: Orthopedics;  Laterality: Left;   AMPUTATION Left 02/08/2021   Procedure: LEFT TRANSMETATARSAL AMPUTATION;  Surgeon: [name], MD;  Location: [facility] OR;  Service: Orthopedics;  Laterality: Left;   AMPUTATION Left 02/10/2021   Procedure: AMPUTATION BELOW KNEE;  Surgeon: [name], MD;  Location: [facility] OR;  Service: Orthopedics;  Laterality: Left;   AMPUTATION Right 04/14/2021   Procedure: RIGHT 1ST AND 2ND RAY AMPUTATION;  Surgeon: [name], MD;  Location: [facility] OR;  Service: Orthopedics;  Laterality: Right;   AMPUTATION Right 04/26/2021   Procedure: RIGHT BELOW KNEE AMPUTATION;  Surgeon: [name], MD;  Location: [facility] OR;  Service: Orthopedics;  Laterality: Right;   NO PAST SURGERIES     RIGHT/LEFT HEART CATH AND CORONARY ANGIOGRAPHY N/A 01/02/2021   Procedure: RIGHT/LEFT HEART CATH AND  CORONARY ANGIOGRAPHY;  Surgeon: Runell Gess, MD;  Location: [facility] INVASIVE CV LAB;  Service: Cardiovascular;  Laterality: N/A;     A IV Location/Drains/Wounds Patient Lines/Drains/Airways Status     Active Line/Drains/Airways     Name Placement date Placement time Site Days   Peripheral IV 04/08/23 20 G Anterior;Right Forearm 04/08/23  1855  Forearm  less than 1   Negative Pressure Wound Therapy Leg Anterior;Left 02/10/21  0925  --  787   Negative Pressure Wound Therapy Leg Anterior;Right  04/26/21  1600  --  712            Intake/Output Last 24 hours  Intake/Output Summary (Last 24 hours) at 04/08/2023 2348 Last data filed at 04/08/2023 2138 Gross per 24 hour  Intake 500 ml  Output --  Net 500 ml    Labs/Imaging Results for orders placed or performed during the hospital encounter of 04/08/23 (from the past 48 hour(s))  CBC     Status: Abnormal   Collection Time: 04/08/23  3:00 PM  Result Value Ref Range   WBC 9.0 4.0 - 10.5 K/uL   RBC 3.27 (L) 4.22 - 5.81 MIL/uL   Hemoglobin 9.0 (L) 13.0 - 17.0 g/dL   HCT 16.1 (L) 09.6 - 04.5 %   MCV 86.5 80.0 - 100.0 fL   MCH 27.5 26.0 - 34.0 pg   MCHC 31.8 30.0 - 36.0 g/dL   RDW 40.9 81.1 - 91.4 %   Platelets 273 150 - 400 K/uL   nRBC 0.0 0.0 - 0.2 %    Comment: Performed at [facility] Lab, [address], [place], [place] [address]  Basic metabolic panel     Status: Abnormal   Collection Time: 04/08/23  3:00 PM  Result Value Ref Range   Sodium 136 135 - 145 mmol/L   Potassium 4.3 3.5 - 5.1 mmol/L   Chloride 102 98 - 111 mmol/L   CO2 22 22 - 32 mmol/L   Glucose, Bld 207 (H) 70 - 99 mg/dL    Comment: Glucose reference range applies only to samples taken after fasting for at least 8 hours.   BUN 59 (H) 8 - 23 mg/dL   Creatinine, Ser 6.21 (H) 0.61 - 1.24 mg/dL   Calcium 9.0 8.9 - 30.8 mg/dL   GFR, Estimated 22 (L) >60 mL/min    Comment: (NOTE) Calculated using the CKD-EPI Creatinine Equation (2021)    Anion gap 12 5 - 15    Comment: Performed at [facility] Lab, [address], [place], [place] [address]  Resp panel by RT-PCR (RSV, Flu A&B, Covid) Anterior Nasal Swab     Status: None   Collection Time: 04/08/23  3:10 PM   Specimen: Anterior Nasal Swab  Result Value Ref Range   SARS Coronavirus 2 by RT PCR NEGATIVE NEGATIVE   Influenza A by PCR NEGATIVE NEGATIVE   Influenza B by PCR NEGATIVE NEGATIVE    Comment: (NOTE) The Xpert Xpress SARS-CoV-2/FLU/RSV plus assay is intended as an aid in the diagnosis of  influenza from Nasopharyngeal swab specimens and should not be used as a sole basis for treatment. Nasal washings and aspirates are unacceptable for Xpert Xpress SARS-CoV-2/FLU/RSV testing.  Fact Sheet for Patients: BloggerCourse.com  Fact Sheet for Healthcare Providers: SeriousBroker.it  This test is not yet approved or cleared by the [place] FDA and has been authorized for detection and/or diagnosis of SARS-CoV-2 by FDA under an Emergency Use Authorization (EUA). This EUA will remain  in effect (meaning this test can be used) for the duration of the COVID-19 declaration under Section 564(b)(1) of the Act, 21 U.S.C. section 360bbb-3(b)(1), unless the authorization is terminated or revoked.     Resp Syncytial Virus by PCR NEGATIVE NEGATIVE    Comment: (NOTE) Fact Sheet for Patients: BloggerCourse.com  Fact Sheet for Healthcare Providers: SeriousBroker.it  This test is not yet approved or cleared by the [place] FDA and has been authorized for detection and/or diagnosis of SARS-CoV-2 by FDA under an Emergency Use Authorization (EUA). This EUA will remain in effect (meaning this test can be used) for the duration of the COVID-19 declaration under Section 564(b)(1) of the Act, 21 U.S.C. section 360bbb-3(b)(1), unless the authorization is terminated or revoked.  Performed at [facility] Lab, [address] [place], [place] [address]   Rapid urine drug screen (hospital performed)     Status: Abnormal   Collection Time: 04/08/23  8:00 PM  Result Value Ref Range   Opiates NONE DETECTED NONE DETECTED   Cocaine POSITIVE (A) NONE DETECTED   Benzodiazepines NONE DETECTED NONE DETECTED   Amphetamines NONE DETECTED NONE DETECTED   Tetrahydrocannabinol NONE DETECTED NONE DETECTED   Barbiturates NONE DETECTED NONE DETECTED    Comment: (NOTE) DRUG SCREEN FOR MEDICAL  PURPOSES ONLY.  IF CONFIRMATION IS NEEDED FOR ANY PURPOSE, NOTIFY LAB WITHIN 5 DAYS.  LOWEST DETECTABLE LIMITS FOR URINE DRUG SCREEN Drug Class                     Cutoff (ng/mL) Amphetamine and metabolites    1000 Barbiturate and metabolites    200 Benzodiazepine                 200 Opiates and metabolites        300 Cocaine and metabolites        300 THC                            50 Performed at [facility], [address] [place], [place] [address]   Urinalysis, w/ Reflex to Culture (Infection Suspected) -Urine, Clean Catch     Status: Abnormal   Collection Time: 04/08/23  8:00 PM  Result Value Ref Range   Specimen Source URINE, CLEAN CATCH    Color, Urine YELLOW YELLOW   APPearance CLOUDY (A) CLEAR   Specific Gravity, Urine 1.010 1.005 - 1.030   pH 5.0 5.0 - 8.0   Glucose, UA 150 (A) NEGATIVE mg/dL   Hgb urine dipstick NEGATIVE NEGATIVE   Bilirubin Urine NEGATIVE NEGATIVE   Ketones, ur NEGATIVE NEGATIVE mg/dL   Protein, ur 244 (A) NEGATIVE mg/dL   Nitrite NEGATIVE NEGATIVE   Leukocytes,Ua LARGE (A) NEGATIVE   RBC / HPF 6-10 0 - 5 RBC/hpf   WBC, UA >50 0 - 5 WBC/hpf    Comment:        Reflex urine culture not performed if WBC <=10, OR if Squamous epithelial cells >5. If Squamous epithelial cells >5 suggest recollection.    Bacteria, UA FEW (A) NONE SEEN   Squamous Epithelial / HPF 0-5 0 - 5 /HPF   WBC Clumps PRESENT    Mucus PRESENT     Comment: Performed at [facility], [address], [place], [place] [address]  CK     Status: None   Collection Time: 04/08/23  9:50 PM  Result Value Ref Range  Total CK 227 49 - 397 U/L    Comment: Performed at [facility], [address] [place], [place] [address]  Magnesium     Status: None   Collection Time: 04/08/23  9:50 PM  Result Value Ref Range   Magnesium 2.3 1.7 - 2.4 mg/dL    Comment: Performed at [facility], [address] [place], [place] [address]   DG Pelvis Portable  Result  Date: 04/08/2023 CLINICAL DATA:  Two falls yesterday. EXAM: PORTABLE PELVIS 1-2 VIEWS COMPARISON:  None Available. FINDINGS: There is no evidence of pelvic fracture or diastasis. Mild to moderate severity degenerative changes are seen in the form of joint space narrowing, acetabular sclerosis and lateral acetabular bony spurring. No pelvic bone lesions are seen. IMPRESSION: No acute osseous abnormality. Electronically Signed   By: Aram Candela M.D.   On: 04/08/2023 19:42   DG Shoulder Right Port  Result Date: 04/08/2023 CLINICAL DATA:  Status post fall. EXAM: RIGHT SHOULDER - 1 VIEW COMPARISON:  Mar 13, 2022 FINDINGS: There is no evidence of fracture or dislocation. Moderate to marked severity degenerative changes are seen involving the right acromioclavicular joint and right glenohumeral articulation. Soft tissues are unremarkable. IMPRESSION: Moderate to marked severity degenerative changes without evidence of acute fracture or dislocation. Electronically Signed   By: Aram Candela M.D.   On: 04/08/2023 19:40   DG Chest Portable 1 View  Result Date: 04/08/2023 CLINICAL DATA:  Multiple recent falls. EXAM: PORTABLE CHEST 1 VIEW COMPARISON:  October 15, 2022 FINDINGS: The heart size and mediastinal contours are within normal limits. Both lungs are clear. Degenerative changes seen involving both shoulders. No acute osseous abnormalities are identified. IMPRESSION: No active disease. Electronically Signed   By: Aram Candela M.D.   On: 04/08/2023 19:38   CT Head Wo Contrast  Result Date: 04/08/2023 CLINICAL DATA:  Fall EXAM: CT HEAD WITHOUT CONTRAST CT CERVICAL SPINE WITHOUT CONTRAST TECHNIQUE: Multidetector CT imaging of the head and cervical spine was performed following the standard protocol without intravenous contrast. Multiplanar CT image reconstructions of the cervical spine were also generated. RADIATION DOSE REDUCTION: This exam was performed according to the departmental dose-optimization  program which includes automated exposure control, adjustment of the mA and/or kV according to patient size and/or use of iterative reconstruction technique. COMPARISON:  None Available. FINDINGS: CT HEAD FINDINGS Brain: There is no acute intracranial hemorrhage, extra-axial fluid collection, or acute infarct Parenchymal volume is normal. The ventricles are normal in size. Gray-white differentiation is preserved. A focus of hypodensity in the left basal ganglia is consistent with a prominent perivascular space versus remote lacunar infarct. Additional hypodensity in the supratentorial white matter likely reflects sequela of underlying chronic small-vessel ischemic change. The pituitary and suprasellar region are normal. There is no mass lesion. There is no mass effect or midline shift. Vascular: No hyperdense vessel or unexpected calcification. Skull: Normal. Negative for fracture or focal lesion. Sinuses/Orbits: The paranasal sinuses and mastoid air cells are clear. There is a remote fracture of the left lamina papyracea. The globes and orbits are otherwise unremarkable. Other: There is swelling in the suboccipital soft tissues to the left of midline likely reflecting hematoma/contusion. CT CERVICAL SPINE FINDINGS Alignment: Normal. Skull base and vertebrae: Skull base alignment is maintained. Vertebral body heights are preserved. There is no evidence of acute fracture there is no suspicious osseous lesion. Soft tissues and spinal canal: No prevertebral fluid or swelling. No visible canal hematoma. Disc levels: There is disc  space narrowing and degenerative endplate change at C3-C4 and C5-C6. There is no evidence of high-grade spinal canal stenosis. Upper chest: The imaged lung apices are clear. Other: None. IMPRESSION: 1. No acute intracranial pathology. 2. No acute fracture or traumatic malalignment of the cervical spine. 3. Suboccipital hematoma/contusion. Electronically Signed   By: Lesia Hausen M.D.   On:  04/08/2023 19:18   CT Cervical Spine Wo Contrast  Result Date: 04/08/2023 CLINICAL DATA:  Fall EXAM: CT HEAD WITHOUT CONTRAST CT CERVICAL SPINE WITHOUT CONTRAST TECHNIQUE: Multidetector CT imaging of the head and cervical spine was performed following the standard protocol without intravenous contrast. Multiplanar CT image reconstructions of the cervical spine were also generated. RADIATION DOSE REDUCTION: This exam was performed according to the departmental dose-optimization program which includes automated exposure control, adjustment of the mA and/or kV according to patient size and/or use of iterative reconstruction technique. COMPARISON:  None Available. FINDINGS: CT HEAD FINDINGS Brain: There is no acute intracranial hemorrhage, extra-axial fluid collection, or acute infarct Parenchymal volume is normal. The ventricles are normal in size. Gray-white differentiation is preserved. A focus of hypodensity in the left basal ganglia is consistent with a prominent perivascular space versus remote lacunar infarct. Additional hypodensity in the supratentorial white matter likely reflects sequela of underlying chronic small-vessel ischemic change. The pituitary and suprasellar region are normal. There is no mass lesion. There is no mass effect or midline shift. Vascular: No hyperdense vessel or unexpected calcification. Skull: Normal. Negative for fracture or focal lesion. Sinuses/Orbits: The paranasal sinuses and mastoid air cells are clear. There is a remote fracture of the left lamina papyracea. The globes and orbits are otherwise unremarkable. Other: There is swelling in the suboccipital soft tissues to the left of midline likely reflecting hematoma/contusion. CT CERVICAL SPINE FINDINGS Alignment: Normal. Skull base and vertebrae: Skull base alignment is maintained. Vertebral body heights are preserved. There is no evidence of acute fracture there is no suspicious osseous lesion. Soft tissues and spinal canal:  No prevertebral fluid or swelling. No visible canal hematoma. Disc levels: There is disc space narrowing and degenerative endplate change at C3-C4 and C5-C6. There is no evidence of high-grade spinal canal stenosis. Upper chest: The imaged lung apices are clear. Other: None. IMPRESSION: 1. No acute intracranial pathology. 2. No acute fracture or traumatic malalignment of the cervical spine. 3. Suboccipital hematoma/contusion. Electronically Signed   By: Lesia Hausen M.D.   On: 04/08/2023 19:18    Pending Labs Unresulted Labs (From admission, onward)     Start     Ordered   04/09/23 0500  CBC with Differential/Platelet  Tomorrow morning,   R        04/08/23 2041   04/09/23 0500  Comprehensive metabolic panel  Tomorrow morning,   R        04/08/23 2041   04/09/23 0500  Magnesium  Tomorrow morning,   R        04/08/23 2041   04/08/23 2223  Sodium, urine, random  Add-on,   AD        04/08/23 2222   04/08/23 2223  Creatinine, urine, random  Add-on,   AD        04/08/23 2222   04/08/23 2223  Protein / creatinine ratio, urine  Add-on,   AD        04/08/23 2222   04/08/23 2000  Urine Culture  Once,   R        04/08/23 2000  Vitals/Pain Today's Vitals   04/08/23 1745 04/08/23 1830 04/08/23 1853 04/08/23 2333  BP: (!) 157/82 114/77  121/74  Pulse:  76  79  Resp: 16 12  17   Temp:   98 F (36.7 C) 98.1 F (36.7 C)  TempSrc:   Oral Oral  SpO2:  100%  100%  PainSc:        Isolation Precautions No active isolations  Medications Medications  acetaminophen (TYLENOL) tablet 650 mg (has no administration in time range)    Or  acetaminophen (TYLENOL) suppository 650 mg (has no administration in time range)  heparin injection 5,000 Units (5,000 Units Subcutaneous Given 04/08/23 2149)  melatonin tablet 3 mg (has no administration in time range)  ondansetron (ZOFRAN) injection 4 mg (has no administration in time range)  lactated ringers infusion ( Intravenous New Bag/Given 04/08/23  2101)  cefTRIAXone (ROCEPHIN) 1 g in sodium chloride 0.9 % 100 mL IVPB (has no administration in time range)  atorvastatin (LIPITOR) tablet 10 mg (has no administration in time range)  aspirin chewable tablet 81 mg (has no administration in time range)  ferrous sulfate tablet 325 mg (has no administration in time range)  insulin aspart (novoLOG) injection 0-9 Units (has no administration in time range)  lactated ringers bolus 500 mL (0 mLs Intravenous Stopped 04/08/23 2138)    Mobility manual wheelchair     Focused Assessments Neuro Assessment Handoff:  Swallow screen pass? Yes          Neuro Assessment: Within Defined Limits Neuro Checks:      Has TPA been given? Yes Temp: 98.1 F (36.7 C) (06/03 2333) Temp Source: Oral (06/03 2333) BP: 121/74 (06/03 2333) Pulse Rate: 79 (06/03 2333) If patient is a Neuro Trauma and patient is going to OR before floor call report to 4N Charge nurse: [phone] or [phone]   R Recommendations: See Admitting Provider Note  Report given to:   Additional Notes: A and o x 4 Bilateral BKA, multiple recent falls. Pt states that he can no longer take care of himself

## 2023-04-08 NOTE — ED Provider Triage Note (Signed)
Emergency Medicine Provider Triage Evaluation Note  Scott Greer , a 67 y.o. male  was evaluated in triage.  Pt complains of headache, cough, congestion.  Review of Systems  Positive:  Negative:   Physical Exam  BP (!) 91/54 (BP Location: Right Arm)   Pulse 70   Temp 98.4 F (36.9 C) (Oral)   Resp 14   SpO2 100%  Gen:   Awake, no distress   Resp:  Normal effort  MSK:   Moves extremities without difficulty  Other:    Medical Decision Making  Medically screening exam initiated at 3:02 PM.  Appropriate orders placed.  Scott Greer was informed that the remainder of the evaluation will be completed by another provider, this initial triage assessment does not replace that evaluation, and the importance of remaining in the ED until their evaluation is complete.  Patient complaining of posterior headache, productive cough and congestion - stating that multiple people that he lives with currently have the flu. States "I just need some good rest".  Also endorses 2 mechanical falls yesterday. Denies loss of consciousness,  blood thinners, seizures, confusion, and vomiting, midline spinal tenderness. Neuro exam unremarkable.  Obtaining BMP, CBC, UDS, and respiratory panel    Dorthy Cooler, [place] 04/08/23 1510

## 2023-04-08 NOTE — Congregational Nurse Program (Signed)
  Dept: [phone]   Congregational Nurse Program Note  Date of Encounter: 04/08/2023  Past Medical History: Past Medical History:  Diagnosis Date   CHF (congestive heart failure) (HCC)    Dehiscence of amputation stump (HCC)    left great toe   Diabetes mellitus without complication (HCC) 1999   Type II   Glaucoma 2015   Hyperlipidemia    Hypertension 2005   Left foot infection 02/07/2021   Osteomyelitis (HCC)    Wears glasses     Encounter Details:  CNP Questionnaire - 04/08/23 1330       Questionnaire   Ask client: Do you give verbal consent for me to treat you today? Yes    Student Assistance N/A    Location Patient Served  [facility]    Visit Setting with Client Organization;Phone/Text/Email    Patient Status Unhoused    Insurance Medicaid    Insurance/Financial Assistance Referral N/A    Medication N/A    Medical Provider Yes    Screening Referrals Made N/A    Medical Referrals Made ED    Medical Appointment Made N/A    Recently w/o PCP, now 1st time PCP visit completed due to CNs referral or appointment made N/A    Food N/A    Transportation Need transportation assistance;Referred to transportation service    Housing/Utilities No permanent housing    Interpersonal Safety N/A    Interventions Advocate/Support;Reviewed Medications    Abnormal to Normal Screening Since Last CN Visit N/A    Screenings CN Performed Blood Pressure;Blood Glucose;Pulse Ox    Sent Client to Lab for: N/A    Did client attend any of the following based off CNs referral or appointments made? N/A    ED Visit Averted N/A    Life-Saving Intervention Made N/A            Client came to nurse's office reporting a headache with pain on 7 on 0-10 score with 10 the most. He c/o neck pain that started yesterday following two falls from his wheelchair. One fall involved picking up a piece of trash and the other trying to get his phone after he dropped it. He was using his manual wheelchair at the  time. Checked vitals and cbg. Blood pressure 103/63, pulse 74, SpO2 90 %. CBG 293. Clients lower head is sore with some swelling. Contacted PCP and was instructed to send non emergency to ED for evaluation.  Tieisha Darden W RN CN

## 2023-04-08 NOTE — H&P (Signed)
History and Physical      Scott Greer Scott Greer DOB: 02/06/1956 DOA: 04/08/2023; DOS: 04/08/2023  PCP: [name], MD *** Patient coming from: home ***  I have personally briefly reviewed patient's old medical records in [facility]  Chief Complaint: ***  HPI: Scott Greer is a 67 y.o. male with medical history significant for *** who is admitted to [facility] on 04/08/2023 with *** after presenting from home*** to [facility] ED complaining of ***.    ***       ***   ED Course:  Vital signs in the ED were notable for the following: ***  Labs were notable for the following: ***  Per my interpretation, EKG in ED demonstrated the following:  ***  Imaging and additional notable ED work-up: ***  While in the ED, the following were administered: ***  Subsequently, the patient was admitted  ***  ***red    Review of Systems: As per HPI otherwise 10 point review of systems negative.   Past Medical History:  Diagnosis Date   CHF (congestive heart failure) (HCC)    Dehiscence of amputation stump (HCC)    left great toe   Diabetes mellitus without complication (HCC) 1999   Type II   Glaucoma 2015   Hyperlipidemia    Hypertension 2005   Left foot infection 02/07/2021   Osteomyelitis (HCC)    Wears glasses     Past Surgical History:  Procedure Laterality Date   AMPUTATION Left 06/05/2019   Procedure: LEFT GREAT TOE AMPUTATION;  Surgeon: [name], MD;  Location: [facility] OR;  Service: Orthopedics;  Laterality: Left;   AMPUTATION Left 07/10/2019   Procedure: LEFT FOOT 1ST RAY AMPUTATION;  Surgeon: [name], MD;  Location: [facility] OR;  Service: Orthopedics;  Laterality: Left;   AMPUTATION Right 05/25/2020   Procedure: RIGHT GREAT TOE AMPUTATION;  Surgeon: [name], MD;  Location: [facility] OR;  Service: Orthopedics;  Laterality: Right;   AMPUTATION Left 01/25/2021   Procedure: LEFT 2ND TOE AMPUTATION;  Surgeon: [name], MD;  Location: [facility] OR;   Service: Orthopedics;  Laterality: Left;   AMPUTATION Left 02/08/2021   Procedure: LEFT TRANSMETATARSAL AMPUTATION;  Surgeon: [name], MD;  Location: [facility] OR;  Service: Orthopedics;  Laterality: Left;   AMPUTATION Left 02/10/2021   Procedure: AMPUTATION BELOW KNEE;  Surgeon: [name], MD;  Location: [facility] OR;  Service: Orthopedics;  Laterality: Left;   AMPUTATION Right 04/14/2021   Procedure: RIGHT 1ST AND 2ND RAY AMPUTATION;  Surgeon: [name], MD;  Location: [facility] OR;  Service: Orthopedics;  Laterality: Right;   AMPUTATION Right 04/26/2021   Procedure: RIGHT BELOW KNEE AMPUTATION;  Surgeon: [name], MD;  Location: [facility] OR;  Service: Orthopedics;  Laterality: Right;   NO PAST SURGERIES     RIGHT/LEFT HEART CATH AND CORONARY ANGIOGRAPHY N/A 01/02/2021   Procedure: RIGHT/LEFT HEART CATH AND CORONARY ANGIOGRAPHY;  Surgeon: Runell Gess, MD;  Location: [facility] INVASIVE CV LAB;  Service: Cardiovascular;  Laterality: N/A;    Social History:  reports that he has been smoking cigars. He has quit using smokeless tobacco.  His smokeless tobacco use included chew. He reports current alcohol use of about 11.0 standard drinks of alcohol per week. He reports current drug use. Drug: Marijuana.   Allergies  Allergen Reactions   Bee Venom Hives, Itching and Swelling    Family History  Problem Relation Age of Onset   Stroke  Mother    Diabetes Mother        Toward end of life    Family history reviewed and not pertinent ***   Prior to Admission medications   Medication Sig Start Date End Date Taking? Authorizing Provider  aspirin (ASPIRIN LOW DOSE) 81 MG chewable tablet CHEW ONE TABLET BY MOUTH ONCE DAILY Needs appointment for further refills 10/25/22  Yes McClung, Angela M, PA-C  atorvastatin (LIPITOR) 10 MG tablet Take 1 tablet (10 mg total) by mouth every evening. 12/14/22  Yes [name], MD  carvedilol (COREG) 3.125 MG tablet Take 1 tablet (3.125 mg total) by mouth 2 (two)  times daily with a meal. 12/14/22  Yes [name], MD  digoxin (LANOXIN) 0.125 MG tablet Take 1 tablet (0.125 mg total) by mouth every evening with supper. 12/14/22  Yes [name], MD  empagliflozin (JARDIANCE) 10 MG TABS tablet Take 1 tablet (10 mg total) by mouth daily at 12 noon. 12/14/22  Yes [name], MD  ferrous sulfate (FEROSUL) 325 (65 FE) MG tablet TAKE ONE TABLET BY MOUTH EVERY MORNING WITH BREAKFAST 08/15/22  Yes [name], MD  gabapentin (NEURONTIN) 300 MG capsule Take 1 capsule (300 mg total) by mouth 2 (two) times daily. 01/17/23  Yes [name], MD  glimepiride (AMARYL) 1 MG tablet Take 1 tablet (1 mg total) by mouth daily with breakfast. 12/21/22  Yes [name], MD  isosorbide-hydrALAZINE (BIDIL) 20-37.5 MG tablet Take 1 tablet by mouth 2 (two) times daily. 12/14/22  Yes [name], MD  sacubitril-valsartan (ENTRESTO) 24-26 MG Take 1 tablet by mouth 2 (two) times daily. 12/14/22  Yes [name], MD  spironolactone (ALDACTONE) 25 MG tablet Take 1 tablet (25 mg total) by mouth every morning. 12/14/22  Yes [name], MD  torsemide (DEMADEX) 20 MG tablet Take 1 tablet (20 mg total) by mouth daily. 03/05/23  Yes [name], MD     Objective    Physical Exam: Vitals:   04/08/23 1431 04/08/23 1745 04/08/23 1830 04/08/23 1853  BP: (!) 91/54 (!) 157/82 114/77   Pulse:   76   Resp:  16 12   Temp:    98 F (36.7 C)  TempSrc:    Oral  SpO2:   100%     General: appears to be stated age; alert, oriented Skin: warm, dry, no rash Head:  AT/[place] Mouth:  Oral mucosa membranes appear moist, normal dentition Neck: supple; trachea midline Heart:  RRR; did not appreciate any M/R/G Lungs: CTAB, did not appreciate any wheezes, rales, or rhonchi Abdomen: + BS; soft, ND, NT Vascular: 2+ pedal pulses b/l; 2+ radial pulses b/l Extremities: no peripheral edema, no muscle wasting Neuro: strength and sensation intact in upper  and lower extremities b/l ***   *** Neuro: 5/5 strength of the proximal and distal flexors and extensors of the upper and lower extremities bilaterally; sensation intact in upper and lower extremities b/l; cranial nerves II through XII grossly intact; no pronator drift; no evidence suggestive of slurred speech, dysarthria, or facial droop; Normal muscle tone. No tremors.  *** Neuro: In the setting of the patient's current mental status and associated inability to follow instructions, unable to perform full neurologic exam at this time.  As such, assessment of strength, sensation, and cranial nerves is limited at this time. Patient noted to spontaneously move all 4 extremities. No tremors.  ***    Labs on Admission: I have  personally reviewed following labs and imaging studies  CBC: Recent Labs  Lab 04/08/23 1500  WBC 9.0  HGB 9.0*  HCT 28.3*  MCV 86.5  PLT 273   Basic Metabolic Panel: Recent Labs  Lab 04/08/23 1500  NA 136  K 4.3  CL 102  CO2 22  GLUCOSE 207*  BUN 59*  CREATININE 3.00*  CALCIUM 9.0   GFR: CrCl cannot be calculated (Unknown ideal weight.). Liver Function Tests: No results for input(s): "AST", "ALT", "ALKPHOS", "BILITOT", "PROT", "ALBUMIN" in the last 168 hours. No results for input(s): "LIPASE", "AMYLASE" in the last 168 hours. No results for input(s): "AMMONIA" in the last 168 hours. Coagulation Profile: No results for input(s): "INR", "PROTIME" in the last 168 hours. Cardiac Enzymes: No results for input(s): "CKTOTAL", "CKMB", "CKMBINDEX", "TROPONINI" in the last 168 hours. BNP (last 3 results) No results for input(s): "PROBNP" in the last 8760 hours. HbA1C: No results for input(s): "HGBA1C" in the last 72 hours. CBG: No results for input(s): "GLUCAP" in the last 168 hours. Lipid Profile: No results for input(s): "CHOL", "HDL", "LDLCALC", "TRIG", "CHOLHDL", "LDLDIRECT" in the last 72 hours. Thyroid Function Tests: No results for input(s):  "TSH", "T4TOTAL", "FREET4", "T3FREE", "THYROIDAB" in the last 72 hours. Anemia Panel: No results for input(s): "VITAMINB12", "FOLATE", "FERRITIN", "TIBC", "IRON", "RETICCTPCT" in the last 72 hours. Urine analysis:    Component Value Date/Time   COLORURINE YELLOW 04/08/2023 2000   APPEARANCEUR CLOUDY (A) 04/08/2023 2000   LABSPEC 1.010 04/08/2023 2000   PHURINE 5.0 04/08/2023 2000   GLUCOSEU 150 (A) 04/08/2023 2000   HGBUR NEGATIVE 04/08/2023 2000   BILIRUBINUR NEGATIVE 04/08/2023 2000   BILIRUBINUR negative 10/30/2022 1405   KETONESUR NEGATIVE 04/08/2023 2000   PROTEINUR 100 (A) 04/08/2023 2000   UROBILINOGEN 0.2 10/30/2022 1405   NITRITE NEGATIVE 04/08/2023 2000   LEUKOCYTESUR LARGE (A) 04/08/2023 2000    Radiological Exams on Admission: DG Pelvis Portable  Result Date: 04/08/2023 CLINICAL DATA:  Two falls yesterday. EXAM: PORTABLE PELVIS 1-2 VIEWS COMPARISON:  None Available. FINDINGS: There is no evidence of pelvic fracture or diastasis. Mild to moderate severity degenerative changes are seen in the form of joint space narrowing, acetabular sclerosis and lateral acetabular bony spurring. No pelvic bone lesions are seen. IMPRESSION: No acute osseous abnormality. Electronically Signed   By: Aram Candela M.D.   On: 04/08/2023 19:42   DG Shoulder Right Port  Result Date: 04/08/2023 CLINICAL DATA:  Status post fall. EXAM: RIGHT SHOULDER - 1 VIEW COMPARISON:  Mar 13, 2022 FINDINGS: There is no evidence of fracture or dislocation. Moderate to marked severity degenerative changes are seen involving the right acromioclavicular joint and right glenohumeral articulation. Soft tissues are unremarkable. IMPRESSION: Moderate to marked severity degenerative changes without evidence of acute fracture or dislocation. Electronically Signed   By: Aram Candela M.D.   On: 04/08/2023 19:40   DG Chest Portable 1 View  Result Date: 04/08/2023 CLINICAL DATA:  Multiple recent falls. EXAM: PORTABLE  CHEST 1 VIEW COMPARISON:  October 15, 2022 FINDINGS: The heart size and mediastinal contours are within normal limits. Both lungs are clear. Degenerative changes seen involving both shoulders. No acute osseous abnormalities are identified. IMPRESSION: No active disease. Electronically Signed   By: Aram Candela M.D.   On: 04/08/2023 19:38   CT Head Wo Contrast  Result Date: 04/08/2023 CLINICAL DATA:  Fall EXAM: CT HEAD WITHOUT CONTRAST CT CERVICAL SPINE WITHOUT CONTRAST TECHNIQUE: Multidetector CT imaging of the head and cervical spine was performed  following the standard protocol without intravenous contrast. Multiplanar CT image reconstructions of the cervical spine were also generated. RADIATION DOSE REDUCTION: This exam was performed according to the departmental dose-optimization program which includes automated exposure control, adjustment of the mA and/or kV according to patient size and/or use of iterative reconstruction technique. COMPARISON:  None Available. FINDINGS: CT HEAD FINDINGS Brain: There is no acute intracranial hemorrhage, extra-axial fluid collection, or acute infarct Parenchymal volume is normal. The ventricles are normal in size. Gray-white differentiation is preserved. A focus of hypodensity in the left basal ganglia is consistent with a prominent perivascular space versus remote lacunar infarct. Additional hypodensity in the supratentorial white matter likely reflects sequela of underlying chronic small-vessel ischemic change. The pituitary and suprasellar region are normal. There is no mass lesion. There is no mass effect or midline shift. Vascular: No hyperdense vessel or unexpected calcification. Skull: Normal. Negative for fracture or focal lesion. Sinuses/Orbits: The paranasal sinuses and mastoid air cells are clear. There is a remote fracture of the left lamina papyracea. The globes and orbits are otherwise unremarkable. Other: There is swelling in the suboccipital soft  tissues to the left of midline likely reflecting hematoma/contusion. CT CERVICAL SPINE FINDINGS Alignment: Normal. Skull base and vertebrae: Skull base alignment is maintained. Vertebral body heights are preserved. There is no evidence of acute fracture there is no suspicious osseous lesion. Soft tissues and spinal canal: No prevertebral fluid or swelling. No visible canal hematoma. Disc levels: There is disc space narrowing and degenerative endplate change at C3-C4 and C5-C6. There is no evidence of high-grade spinal canal stenosis. Upper chest: The imaged lung apices are clear. Other: None. IMPRESSION: 1. No acute intracranial pathology. 2. No acute fracture or traumatic malalignment of the cervical spine. 3. Suboccipital hematoma/contusion. Electronically Signed   By: Lesia Hausen M.D.   On: 04/08/2023 19:18   CT Cervical Spine Wo Contrast  Result Date: 04/08/2023 CLINICAL DATA:  Fall EXAM: CT HEAD WITHOUT CONTRAST CT CERVICAL SPINE WITHOUT CONTRAST TECHNIQUE: Multidetector CT imaging of the head and cervical spine was performed following the standard protocol without intravenous contrast. Multiplanar CT image reconstructions of the cervical spine were also generated. RADIATION DOSE REDUCTION: This exam was performed according to the departmental dose-optimization program which includes automated exposure control, adjustment of the mA and/or kV according to patient size and/or use of iterative reconstruction technique. COMPARISON:  None Available. FINDINGS: CT HEAD FINDINGS Brain: There is no acute intracranial hemorrhage, extra-axial fluid collection, or acute infarct Parenchymal volume is normal. The ventricles are normal in size. Gray-white differentiation is preserved. A focus of hypodensity in the left basal ganglia is consistent with a prominent perivascular space versus remote lacunar infarct. Additional hypodensity in the supratentorial white matter likely reflects sequela of underlying chronic  small-vessel ischemic change. The pituitary and suprasellar region are normal. There is no mass lesion. There is no mass effect or midline shift. Vascular: No hyperdense vessel or unexpected calcification. Skull: Normal. Negative for fracture or focal lesion. Sinuses/Orbits: The paranasal sinuses and mastoid air cells are clear. There is a remote fracture of the left lamina papyracea. The globes and orbits are otherwise unremarkable. Other: There is swelling in the suboccipital soft tissues to the left of midline likely reflecting hematoma/contusion. CT CERVICAL SPINE FINDINGS Alignment: Normal. Skull base and vertebrae: Skull base alignment is maintained. Vertebral body heights are preserved. There is no evidence of acute fracture there is no suspicious osseous lesion. Soft tissues and spinal canal: No prevertebral fluid or  swelling. No visible canal hematoma. Disc levels: There is disc space narrowing and degenerative endplate change at C3-C4 and C5-C6. There is no evidence of high-grade spinal canal stenosis. Upper chest: The imaged lung apices are clear. Other: None. IMPRESSION: 1. No acute intracranial pathology. 2. No acute fracture or traumatic malalignment of the cervical spine. 3. Suboccipital hematoma/contusion. Electronically Signed   By: Lesia Hausen M.D.   On: 04/08/2023 19:18      Assessment/Plan   Principal Problem:   Acute renal failure superimposed on stage 3a chronic kidney disease (HCC)   ***       ***            ***             ***            ***            ***            ***             ***           ***           ***           ***           ***          ***          ***  DVT prophylaxis: SCD's ***  Code Status: Full code*** Family Communication: none*** Disposition Plan: Per Rounding Team Consults called: none***;  Admission status:  ***     I SPENT GREATER THAN 75 *** MINUTES IN CLINICAL CARE TIME/MEDICAL DECISION-MAKING IN COMPLETING THIS ADMISSION.      Chaney Born Matilyn Fehrman DO [facility]  From 7PM - 7AM   04/08/2023, 8:44 PM   ***

## 2023-04-08 NOTE — ED Triage Notes (Signed)
Pt to ED via EMS from urban [employer]. Pt states he had 2 falls yesterday out of wheelchair. Pt reports hitting lip and back of head. Pt is bilateral  BKA. Pt c/o headache and neck pain. Pt denies blood thinners. Pt denies LOC. Pt is also requesting assistance to be placed in nursing home.   EMS Vitals: 116/60 72 HR 16 RR 96% RA 293 CBG

## 2023-04-09 DIAGNOSIS — F17290 Nicotine dependence, other tobacco product, uncomplicated: Secondary | ICD-10-CM | POA: Diagnosis present

## 2023-04-09 DIAGNOSIS — Z823 Family history of stroke: Secondary | ICD-10-CM | POA: Diagnosis not present

## 2023-04-09 DIAGNOSIS — E860 Dehydration: Secondary | ICD-10-CM | POA: Diagnosis present

## 2023-04-09 DIAGNOSIS — N3000 Acute cystitis without hematuria: Secondary | ICD-10-CM | POA: Diagnosis present

## 2023-04-09 DIAGNOSIS — Z1152 Encounter for screening for COVID-19: Secondary | ICD-10-CM | POA: Diagnosis not present

## 2023-04-09 DIAGNOSIS — Z5901 Sheltered homelessness: Secondary | ICD-10-CM | POA: Diagnosis not present

## 2023-04-09 DIAGNOSIS — H409 Unspecified glaucoma: Secondary | ICD-10-CM | POA: Diagnosis present

## 2023-04-09 DIAGNOSIS — Z7984 Long term (current) use of oral hypoglycemic drugs: Secondary | ICD-10-CM | POA: Diagnosis not present

## 2023-04-09 DIAGNOSIS — Z7982 Long term (current) use of aspirin: Secondary | ICD-10-CM | POA: Diagnosis not present

## 2023-04-09 DIAGNOSIS — I5032 Chronic diastolic (congestive) heart failure: Secondary | ICD-10-CM | POA: Diagnosis present

## 2023-04-09 DIAGNOSIS — Z91030 Bee allergy status: Secondary | ICD-10-CM | POA: Diagnosis not present

## 2023-04-09 DIAGNOSIS — Z89511 Acquired absence of right leg below knee: Secondary | ICD-10-CM | POA: Diagnosis not present

## 2023-04-09 DIAGNOSIS — I130 Hypertensive heart and chronic kidney disease with heart failure and stage 1 through stage 4 chronic kidney disease, or unspecified chronic kidney disease: Secondary | ICD-10-CM | POA: Diagnosis present

## 2023-04-09 DIAGNOSIS — Z79899 Other long term (current) drug therapy: Secondary | ICD-10-CM | POA: Diagnosis not present

## 2023-04-09 DIAGNOSIS — I959 Hypotension, unspecified: Secondary | ICD-10-CM | POA: Diagnosis present

## 2023-04-09 DIAGNOSIS — E1142 Type 2 diabetes mellitus with diabetic polyneuropathy: Secondary | ICD-10-CM | POA: Diagnosis present

## 2023-04-09 DIAGNOSIS — N179 Acute kidney failure, unspecified: Secondary | ICD-10-CM | POA: Diagnosis present

## 2023-04-09 DIAGNOSIS — R296 Repeated falls: Secondary | ICD-10-CM | POA: Diagnosis present

## 2023-04-09 DIAGNOSIS — D631 Anemia in chronic kidney disease: Secondary | ICD-10-CM | POA: Diagnosis present

## 2023-04-09 DIAGNOSIS — Z833 Family history of diabetes mellitus: Secondary | ICD-10-CM | POA: Diagnosis not present

## 2023-04-09 DIAGNOSIS — N1831 Chronic kidney disease, stage 3a: Secondary | ICD-10-CM | POA: Diagnosis not present

## 2023-04-09 DIAGNOSIS — W19XXXA Unspecified fall, initial encounter: Secondary | ICD-10-CM

## 2023-04-09 DIAGNOSIS — R531 Weakness: Secondary | ICD-10-CM

## 2023-04-09 DIAGNOSIS — E1122 Type 2 diabetes mellitus with diabetic chronic kidney disease: Secondary | ICD-10-CM | POA: Diagnosis present

## 2023-04-09 DIAGNOSIS — N189 Chronic kidney disease, unspecified: Secondary | ICD-10-CM

## 2023-04-09 DIAGNOSIS — N1832 Chronic kidney disease, stage 3b: Secondary | ICD-10-CM | POA: Diagnosis present

## 2023-04-09 DIAGNOSIS — Z89512 Acquired absence of left leg below knee: Secondary | ICD-10-CM | POA: Diagnosis not present

## 2023-04-09 DIAGNOSIS — E785 Hyperlipidemia, unspecified: Secondary | ICD-10-CM | POA: Diagnosis present

## 2023-04-09 LAB — CBC WITH DIFFERENTIAL/PLATELET
Abs Immature Granulocytes: 0.01 10*3/uL (ref 0.00–0.07)
Basophils Absolute: 0 10*3/uL (ref 0.0–0.1)
Basophils Relative: 0 %
Eosinophils Absolute: 0.2 10*3/uL (ref 0.0–0.5)
Eosinophils Relative: 3 %
HCT: 27.1 % — ABNORMAL LOW (ref 39.0–52.0)
Hemoglobin: 8.8 g/dL — ABNORMAL LOW (ref 13.0–17.0)
Immature Granulocytes: 0 %
Lymphocytes Relative: 17 %
Lymphs Abs: 1.1 10*3/uL (ref 0.7–4.0)
MCH: 28.2 pg (ref 26.0–34.0)
MCHC: 32.5 g/dL (ref 30.0–36.0)
MCV: 86.9 fL (ref 80.0–100.0)
Monocytes Absolute: 0.9 10*3/uL (ref 0.1–1.0)
Monocytes Relative: 14 %
Neutro Abs: 4.2 10*3/uL (ref 1.7–7.7)
Neutrophils Relative %: 66 %
Platelets: 267 10*3/uL (ref 150–400)
RBC: 3.12 MIL/uL — ABNORMAL LOW (ref 4.22–5.81)
RDW: 15.2 % (ref 11.5–15.5)
WBC: 6.3 10*3/uL (ref 4.0–10.5)
nRBC: 0 % (ref 0.0–0.2)

## 2023-04-09 LAB — COMPREHENSIVE METABOLIC PANEL
ALT: 11 U/L (ref 0–44)
AST: 14 U/L — ABNORMAL LOW (ref 15–41)
Albumin: 3.4 g/dL — ABNORMAL LOW (ref 3.5–5.0)
Alkaline Phosphatase: 40 U/L (ref 38–126)
Anion gap: 11 (ref 5–15)
BUN: 49 mg/dL — ABNORMAL HIGH (ref 8–23)
CO2: 23 mmol/L (ref 22–32)
Calcium: 9 mg/dL (ref 8.9–10.3)
Chloride: 106 mmol/L (ref 98–111)
Creatinine, Ser: 2.5 mg/dL — ABNORMAL HIGH (ref 0.61–1.24)
GFR, Estimated: 28 mL/min — ABNORMAL LOW (ref >60)
Glucose, Bld: 107 mg/dL — ABNORMAL HIGH (ref 70–99)
Potassium: 4.3 mmol/L (ref 3.5–5.1)
Sodium: 140 mmol/L (ref 135–145)
Total Bilirubin: 0.3 mg/dL (ref 0.3–1.2)
Total Protein: 6.4 g/dL — ABNORMAL LOW (ref 6.5–8.1)

## 2023-04-09 LAB — GLUCOSE, CAPILLARY
Glucose-Capillary: 101 mg/dL — ABNORMAL HIGH (ref 70–99)
Glucose-Capillary: 242 mg/dL — ABNORMAL HIGH (ref 70–99)
Glucose-Capillary: 174 mg/dL — ABNORMAL HIGH (ref 70–99)
Glucose-Capillary: 218 mg/dL — ABNORMAL HIGH (ref 70–99)

## 2023-04-09 LAB — URINE CULTURE

## 2023-04-09 LAB — TSH: TSH: 1.345 u[IU]/mL (ref 0.350–4.500)

## 2023-04-09 MED ORDER — BENZONATATE 100 MG PO CAPS
200.0000 mg | ORAL_CAPSULE | Freq: Three times a day (TID) | ORAL | Status: DC
  Administered 2023-04-09 – 2023-04-10 (×3): 200 mg via ORAL
  Filled 2023-04-09 (×3): qty 2

## 2023-04-09 MED ORDER — SODIUM CHLORIDE 0.9 % IV SOLN: INTRAVENOUS | Status: DC

## 2023-04-09 NOTE — TOC Initial Note (Addendum)
Transition of Care [facility]) - Initial/Assessment Note    Patient Details  Name: Scott Greer MRN: [id number] Date of Birth: 1956-09-16  Transition of Care [facility]) CM/SW Contact:    Scott Greer, Scott Greer Phone Number: 04/09/2023, 3:39 PM  Clinical Narrative:                 Nursing informed CSW that pt had not been picked up by transportation. CSW contacted [phone] to find out updated on ride. Scott Greer, staff member stated that pt's ride was not in the queue. She was unable to figure out how previously scheduled ride was not in the system. CSW rescheduled ride for 3:30pm, earliest available. Scott Greer stated that she had resent the ride and a dispatch was informed and ride would be scheduled at earliest available. Pt was informed and wanted to wait at the hospital entrance for transportation.    CSW contacted [facility], [phone], to assist pt with setting up transportation. Pt scheduled to be picked up around 2pm at [facility]. Pt informed of assistance with transportation and discharge.    5:12PM CSW contacted [name], [phone], managed plan to scheduled pt's transportation for potential dc tomorrow. CSW scheduled ride tentatively for 1pm. Nursing was unable to confirm discharge. CSW will contact again to set up as they cannot schedule ride until confirmation.   TOC will continue to follow.   CSW met pt at bedside. Pt said he did not want skilled nursing and denied recommendation. He stated that he has a community member assisting him with finding permanent housing. He said that he if goes to a shelter than he will lose his spot and does not want to risk that.   Pt stated that he has had outpatient PT/OT in the past. [facility] nurse case manager Scott Greer has been notified and will assist pt with outpatient referral.   Per provider, pt expected discharge tomorrow. TOC will assist pt with obtaining transportation at discharge.   TOC will continue to follow.   Expected Discharge Plan: Skilled Nursing Facility Barriers to Discharge: Continued Medical Work up   Patient Goals and CMS Choice Patient states their goals for this hospitalization and ongoing recovery are:: don't wanna go to SNF          Expected Discharge Plan and Services In-house Referral: Clinical Social Work     Living arrangements for the past 2 months: Homeless Shelter                                      Prior Living Arrangements/Services Living arrangements for the past 2 months: Homeless Shelter Lives with:: Facility Resident                   Activities of Daily Living Home Assistive Devices/Equipment: Wheelchair ADL Screening (condition at time of admission) Patient's cognitive ability adequate to safely complete daily activities?: Yes Is the patient deaf or have difficulty hearing?: No Does the patient have difficulty seeing, even when wearing glasses/contacts?: Yes Does the patient have difficulty concentrating, remembering, or making decisions?: No Patient able to express need for assistance with ADLs?: Yes Does the patient have difficulty dressing or bathing?: No Independently performs ADLs?: Yes (appropriate for developmental age) Does the patient have difficulty walking or climbing stairs?: Yes Weakness of Legs: Both Weakness of Arms/Hands: None  Permission Sought/Granted  Emotional Assessment Appearance:: Appears stated age Attitude/Demeanor/Rapport: Engaged Affect (typically observed): Guarded Orientation: : Oriented to Place, Oriented to Self, Oriented to  Time, Oriented to Situation Alcohol / Substance Use: Not Applicable, Tobacco Use Psych Involvement: No (comment)  Admission diagnosis:  Acute renal failure superimposed on stage 3a chronic Greer disease (HCC) [N17.9, N18.31] AKI (acute Greer injury) (HCC) [N17.9] Patient Active Problem List   Diagnosis Date Noted   Generalized weakness 04/09/2023    Dehydration 04/09/2023   Fall at home, initial encounter 04/09/2023   Acute cystitis 04/09/2023   History of anemia due to chronic Greer disease 04/09/2023   Acute renal failure superimposed on stage 3a chronic Greer disease (HCC) 04/08/2023   Encounter for power mobility device assessment 01/03/2023   Chronic diastolic CHF (congestive heart failure) (HCC) 12/01/2021   S/P BKA (below knee amputation) bilateral (HCC) 07/18/2021   Gangrene of right foot (HCC)    Foot pain, right 04/14/2021   Status post below-knee amputation (HCC) 02/17/2021   Wound dehiscence    Gangrene of toe of left foot (HCC)    Cutaneous abscess of left foot    Left foot infection 02/07/2021   Leukocytosis 02/07/2021   Thrombocytosis 02/07/2021   Hyponatremia 02/07/2021   AKI (acute Greer injury) (HCC) 02/07/2021   Hyperglycemia due to diabetes mellitus (HCC) 02/07/2021   New onset of congestive heart failure (HCC) 12/26/2020   CKD (chronic Greer disease) stage 3, GFR 30-59 ml/min (HCC) 12/26/2020   Acute systolic CHF (congestive heart failure) (HCC) 12/26/2020   Anemia, chronic disease 06/22/2020   Amputee, great toe, 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 [place] [address] Phone: [phone] Fax: [phone]     Social Determinants of Health (SDOH) Social History: SDOH Screenings   Food Insecurity: Food Insecurity Present (04/09/2023)  Housing: High Risk (04/09/2023)   Transportation Needs: Unmet Transportation Needs (04/09/2023)  Utilities: At Risk (04/09/2023)  Alcohol Screen: Low Risk  (10/18/2021)  Depression (PHQ2-9): Medium Risk (12/14/2020)  Financial Resource Strain: High Risk (10/18/2021)  Physical Activity: Inactive (12/11/2021)  Social Connections: Socially Isolated (10/18/2021)  Stress: Stress Concern Present (02/27/2022)  Tobacco Use: High Risk (04/08/2023)   SDOH Interventions:     Readmission Risk Interventions     No data to display

## 2023-04-09 NOTE — Evaluation (Signed)
Occupational Therapy Evaluation Patient Details Name: Scott Greer MRN: [id number] DOB: 08-11-1956 Today's Date: 04/09/2023   History of Present Illness 67 yo male presents to [facility] on [facility]/3 with fall out of w/c x2. CTH, other imaging negative. Workup for acute on chronic CKD. PMH includes bilat BKA 2022, CKDIII, HF, DMII with polyneuropathy, HTN, HLD, anemia.   Clinical Impression   PTA, pt staying at a shelter, reports typically Modified Independent with ADLs from a wheelchair level (manual + power chair). Pt does endorse recent falls from both wheelchairs (once not wearing seatbelt and once reaching too far forward for an item). Pt endorses R head/neck pain from fall though no functional issues with dominant R UE. Pt able to demonstrate bed mobility with Modified Independence and simulated ADLs with primarily min guard (declined ADL practice until ex-wife brings in clothing). Pt feels he will be able to manage ADLs/transfers fine at discharge and declines need for postacute rehab. Will continue to follow acutely to ensure independence is maximized.      Recommendations for follow up therapy are one component of a multi-disciplinary discharge planning process, led by the attending physician.  Recommendations may be updated based on patient status, additional functional criteria and insurance authorization.   Assistance Recommended at Discharge PRN  Patient can return home with the following Assist for transportation;Assistance with cooking/housework    Functional Status Assessment  Patient has had a recent decline in their functional status and demonstrates the ability to make significant improvements in function in a reasonable and predictable amount of time.  Equipment Recommendations  None recommended by OT    Recommendations for Other Services       Precautions / Restrictions Precautions Precautions: Fall Precaution Comments: bilat BKA w/o prosthetics Restrictions Weight Bearing  Restrictions: No      Mobility Bed Mobility Overal bed mobility: Modified Independent Bed Mobility: Supine to Sit, Sit to Supine                Transfers                   General transfer comment: declined OOB transfer for ADLs      Balance Overall balance assessment: Needs assistance, History of Falls Sitting-balance support: No upper extremity supported Sitting balance-Leahy Scale: Fair                                     ADL either performed or assessed with clinical judgement   ADL Overall ADL's : Needs assistance/impaired Eating/Feeding: Modified independent   Grooming: Set up;Sitting   Upper Body Bathing: Set up   Lower Body Bathing: Sitting/lateral leans;Min guard   Upper Body Dressing : Set up;Sitting   Lower Body Dressing: Min guard;Sitting/lateral leans       Toileting- Clothing Manipulation and Hygiene: Minimal assistance;Sitting/lateral lean         General ADL Comments: Despite R neck/head pain, R UE WFL and pt able to reach back of head, behind back, etc to simulate ADLs. Pt reported desire for wash up, OT offered assistance though pt declined until ex wife brought in clothing for him to wear. Offered clean gown, etc and dressing later though pt politely and adamantly declined     Vision Baseline Vision/History: 1 Wears glasses (readers) Ability to See in Adequate Light: 0 Adequate Patient Visual Report: No change from baseline;Other (comment) (no reading glasses present; reports difficulty seeing menu)  Vision Assessment?: No apparent visual deficits     Perception     Praxis      Pertinent Vitals/Pain Pain Assessment Pain Assessment: Faces Faces Pain Scale: Hurts little more Pain Location: R side of head/neck; R wrist IV site Pain Descriptors / Indicators: Sore Pain Intervention(s): Monitored during session, Heat applied, Other (comment) (heat in place on entry; provided new heat packs at end of session)      Hand Dominance Right   Extremity/Trunk Assessment Upper Extremity Assessment Upper Extremity Assessment: Overall WFL for tasks assessed   Lower Extremity Assessment Lower Extremity Assessment: Defer to PT evaluation   Cervical / Trunk Assessment Cervical / Trunk Assessment: Normal   Communication Communication Communication: No difficulties   Cognition Arousal/Alertness: Awake/alert Behavior During Therapy: WFL for tasks assessed/performed Overall Cognitive Status: No family/caregiver present to determine baseline cognitive functioning Area of Impairment: Problem solving, Safety/judgement                         Safety/Judgement: Decreased awareness of deficits, Decreased awareness of safety   Problem Solving: Requires verbal cues General Comments: pt with some decreased insight into benefits of participating with therapy; particular about needs but pleasant and [facility]     General Comments       Exercises     Shoulder Instructions      Home Living Family/patient expects to be discharged to:: Shelter/Homeless                                 Additional Comments: [facility] by [facility] pt reports only allowed to go in at night and has to stay outside during the day. Has manual wheelchair and power chair that he reportedly just received      Prior Functioning/Environment Prior Level of Function : Independent/Modified Independent;History of Falls (last six months)             Mobility Comments: pt reports able to transfer to/from manual w/c vs power chair but has had 2-3 recent falls. Reports one fall was sliding out of power chair d/t not having seatbelt on, 1 falling forward out of wheelchair reaching for soomething ADLs Comments: pt reports independence with ADLs typically; uses handicapped shower stall w/ shower bench, uses manual w/c for shower transfers and for bus transportation around town to look  for apartments. pt reports some difficulty accessing bathrooms when locked at facility, "kidney issues" with need to urinate often - pt reports having a urinal he can use during the day if needed and often wears depends        OT Problem List: Pain;Decreased strength;Decreased safety awareness      OT Treatment/Interventions: Self-care/ADL training;Therapeutic exercise;DME and/or AE instruction;Energy conservation;Therapeutic activities;Patient/family education    OT Goals(Current goals can be found in the care plan section) Acute Rehab OT Goals Patient Stated Goal: declines need for rehab; hopeful to feel better and find own apartment soon OT Goal Formulation: With patient Time For Goal Achievement: 04/23/23 Potential to Achieve Goals: Good  OT Frequency: Min 2X/week    Co-evaluation              AM-PAC OT "6 Clicks" Daily Activity     Outcome Measure Help from another person eating meals?: None Help from another person taking care of personal grooming?: A Little Help from another person toileting, which includes using toliet, bedpan,  or urinal?: A Little Help from another person bathing (including washing, rinsing, drying)?: A Little Help from another person to put on and taking off regular upper body clothing?: A Little Help from another person to put on and taking off regular lower body clothing?: A Little 6 Click Score: 19   End of Session Nurse Communication: Mobility status  Activity Tolerance: Patient tolerated treatment well Patient left: in bed;with call bell/phone within reach;with bed alarm set;with nursing/sitter in room  OT Visit Diagnosis: Other abnormalities of gait and mobility (R26.89);Muscle weakness (generalized) (M62.81);Pain Pain - Right/Left: Right Pain - part of body:  (head/neck)                Time: 1359-1420 OT Time Calculation (min): 21 min Charges:  OT General Charges $OT Visit: 1 Visit OT Evaluation $OT Eval Low Complexity: 1 Low  Bradd Canary, OTR/L Acute Rehab Services Office: [phone]   Lorre Munroe 04/09/2023, 2:34 PM

## 2023-04-09 NOTE — Progress Notes (Signed)
PROGRESS NOTE  EDU CRESTO  WUJ:[id number] DOB: 05-Jun-1956 DOA: 04/08/2023 PCP: [name], MD   Brief Narrative: Patient is a 67 year old male with history of CKD stage IIIb with creatinine in the range of 1.6-2.1, chronic diastolic CHF, diabetes type 2 with peripheral neuropathy, hypertension, hyperlipidemia who presented with generalized weakness.  Reported 3 to 4 days of diminished oral intake.  He has history of bilateral BKA but is able to independently transfer from his wheelchair.  On presentation, he was mildly hypotensive which improved with IV fluid.  Lab work showed creatinine of 3, hemoglobin of 9.  UA was suspicious for UTI.  Started on ceftriaxone.  CT did not show any acute intracranial findings.  PT/OT recommending SNF on discharge.  TOC consulted  Assessment & Plan:  Principal Problem:   Acute renal failure superimposed on stage 3a chronic kidney disease (HCC) Active Problems:   DM2 (diabetes mellitus, type 2) (HCC)   Essential hypertension   Hyperlipidemia   Chronic diastolic CHF (congestive heart failure) (HCC)   Generalized weakness   Dehydration   Fall at home, initial encounter   Acute cystitis   History of anemia due to chronic kidney disease  AKI on CKD stage IIIb: Baseline creatinine ranging from 1.6-2.1.  Presented with creatinine in the range of 3.  Appears hydrated on presentation.  Started on IV fluids.  Continue to monitor BMP, improving kidney function  Generalized weakness: Presented with 2 to 3 days of generalized weakness.  Patient is status post bilateral BKA.  Ambulates with the help of wheelchair.  Report of fall, 2 episodes over the last 2 days.  CT head, cervical CT did not show any acute fracture or dislocation.  PT/OT consulted, recommended SNF.  Suspected UTI: UA was suspicious for UTI.  No report of dysuria.  No fever or leukocytosis.  Started on ceftriaxone.  Will follow-up urine culture  History of chronic diastolic CHF: Last echo  has shown EF of 55 to 60%, grade 2 diastolic dysfunction.  Appeared  hydrated on presentation and was given IV fluids.  Home diuretic regimen on hold  Diabetes type 2: Takes glimepiride, empagliflozin at home.  Recent A1c of 7.3.  Takes gabapentin for diabetic neuropathy.  Currently on sliding scale.  Monitor blood sugars  Hypertension: Currently normotensive.  Presented with hypotension.  Takes Coreg, spironolactone, torsemide, Entresto, isosorbide, hydralazine at home.  Hyperlipidemia: On statin  Chronic normocytic anemia: Baseline hemoglobin ranges from 8-11.  No evidence of acute blood loss.  Debility: Patient is status post bilateral BKA         DVT prophylaxis:heparin injection 5,000 Units Start: 04/08/23 2200     Code Status: Full Code  Family Communication: None at the bedside  Patient status:obs  Patient is from : Shelter  Anticipated discharge to: SNF  Estimated DC date: 1 to 2 days   Consultants: None  Procedures: None  Antimicrobials:  Anti-infectives (From admission, onward)    Start     Dose/Rate Route Frequency Ordered Stop   04/08/23 2230  cefTRIAXone (ROCEPHIN) 1 g in sodium chloride 0.9 % 100 mL IVPB        1 g 200 mL/hr over 30 Minutes Intravenous Every 24 hours 04/08/23 2223         Subjective: Patient seen and examined at bedside today.  Hemodynamically stable.  Lying in bed.  Complains of generalized weakness.  Apparently he looks comfortable.  No complaint of chest pain, shortness of breath, nausea or vomiting.  Objective: Vitals:   04/08/23 2333 04/09/23 0030 04/09/23 0108 04/09/23 0513  BP: 121/74 (!) 145/79 (!) 149/66 123/69  Pulse: 79 76 77 64  Resp: 17 14 18 16   Temp: 98.1 F (36.7 C) 98.1 F (36.7 C) 97.9 F (36.6 C) 98.3 F (36.8 C)  TempSrc: Oral Oral  Oral  SpO2: 100% 99% 100% 100%  Weight:   91.9 kg     Intake/Output Summary (Last 24 hours) at 04/09/2023 0747 Last data filed at 04/09/2023 0522 Gross per 24 hour  Intake  1410.06 ml  Output --  Net 1410.06 ml   Filed Weights   04/09/23 0108  Weight: 91.9 kg    Examination:  General exam: Overall comfortable, not in distress HEENT: PERRL Respiratory system:  no wheezes or crackles  Cardiovascular system: S1 & S2 heard, RRR.  Gastrointestinal system: Abdomen is nondistended, soft and nontender. Central nervous system: Alert and oriented Extremities: No edema, no clubbing ,no cyanosis, bilateral BKA Skin: No rashes, no ulcers,no icterus     Data Reviewed: I have personally reviewed following labs and imaging studies  CBC: Recent Labs  Lab 04/08/23 1500  WBC 9.0  HGB 9.0*  HCT 28.3*  MCV 86.5  PLT 273   Basic Metabolic Panel: Recent Labs  Lab 04/08/23 1500 04/08/23 2150  NA 136  --   K 4.3  --   CL 102  --   CO2 22  --   GLUCOSE 207*  --   BUN 59*  --   CREATININE 3.00*  --   CALCIUM 9.0  --   MG  --  2.3     Recent Results (from the past 240 hour(s))  Resp panel by RT-PCR (RSV, Flu A&B, Covid) Anterior Nasal Swab     Status: None   Collection Time: 04/08/23  3:10 PM   Specimen: Anterior Nasal Swab  Result Value Ref Range Status   SARS Coronavirus 2 by RT PCR NEGATIVE NEGATIVE Final   Influenza A by PCR NEGATIVE NEGATIVE Final   Influenza B by PCR NEGATIVE NEGATIVE Final    Comment: (NOTE) The Xpert Xpress SARS-CoV-2/FLU/RSV plus assay is intended as an aid in the diagnosis of influenza from Nasopharyngeal swab specimens and should not be used as a sole basis for treatment. Nasal washings and aspirates are unacceptable for Xpert Xpress SARS-CoV-2/FLU/RSV testing.  Fact Sheet for Patients: BloggerCourse.com  Fact Sheet for Healthcare Providers: SeriousBroker.it  This test is not yet approved or cleared by the [place] FDA and has been authorized for detection and/or diagnosis of SARS-CoV-2 by FDA under an Emergency Use Authorization (EUA). This EUA will remain in  effect (meaning this test can be used) for the duration of the COVID-19 declaration under Section 564(b)(1) of the Act, 21 U.S.C. section 360bbb-3(b)(1), unless the authorization is terminated or revoked.     Resp Syncytial Virus by PCR NEGATIVE NEGATIVE Final    Comment: (NOTE) Fact Sheet for Patients: BloggerCourse.com  Fact Sheet for Healthcare Providers: SeriousBroker.it  This test is not yet approved or cleared by the [place] FDA and has been authorized for detection and/or diagnosis of SARS-CoV-2 by FDA under an Emergency Use Authorization (EUA). This EUA will remain in effect (meaning this test can be used) for the duration of the COVID-19 declaration under Section 564(b)(1) of the Act, 21 U.S.C. section 360bbb-3(b)(1), unless the authorization is terminated or revoked.  Performed at [facility], [address], [place], [place] [address]  Radiology Studies: DG Pelvis Portable  Result Date: 04/08/2023 CLINICAL DATA:  Two falls yesterday. EXAM: PORTABLE PELVIS 1-2 VIEWS COMPARISON:  None Available. FINDINGS: There is no evidence of pelvic fracture or diastasis. Mild to moderate severity degenerative changes are seen in the form of joint space narrowing, acetabular sclerosis and lateral acetabular bony spurring. No pelvic bone lesions are seen. IMPRESSION: No acute osseous abnormality. Electronically Signed   By: Aram Candela M.D.   On: 04/08/2023 19:42   DG Shoulder Right Port  Result Date: 04/08/2023 CLINICAL DATA:  Status post fall. EXAM: RIGHT SHOULDER - 1 VIEW COMPARISON:  Mar 13, 2022 FINDINGS: There is no evidence of fracture or dislocation. Moderate to marked severity degenerative changes are seen involving the right acromioclavicular joint and right glenohumeral articulation. Soft tissues are unremarkable. IMPRESSION: Moderate to marked severity degenerative changes without evidence of acute fracture  or dislocation. Electronically Signed   By: Aram Candela M.D.   On: 04/08/2023 19:40   DG Chest Portable 1 View  Result Date: 04/08/2023 CLINICAL DATA:  Multiple recent falls. EXAM: PORTABLE CHEST 1 VIEW COMPARISON:  October 15, 2022 FINDINGS: The heart size and mediastinal contours are within normal limits. Both lungs are clear. Degenerative changes seen involving both shoulders. No acute osseous abnormalities are identified. IMPRESSION: No active disease. Electronically Signed   By: Aram Candela M.D.   On: 04/08/2023 19:38   CT Head Wo Contrast  Result Date: 04/08/2023 CLINICAL DATA:  Fall EXAM: CT HEAD WITHOUT CONTRAST CT CERVICAL SPINE WITHOUT CONTRAST TECHNIQUE: Multidetector CT imaging of the head and cervical spine was performed following the standard protocol without intravenous contrast. Multiplanar CT image reconstructions of the cervical spine were also generated. RADIATION DOSE REDUCTION: This exam was performed according to the departmental dose-optimization program which includes automated exposure control, adjustment of the mA and/or kV according to patient size and/or use of iterative reconstruction technique. COMPARISON:  None Available. FINDINGS: CT HEAD FINDINGS Brain: There is no acute intracranial hemorrhage, extra-axial fluid collection, or acute infarct Parenchymal volume is normal. The ventricles are normal in size. Gray-white differentiation is preserved. A focus of hypodensity in the left basal ganglia is consistent with a prominent perivascular space versus remote lacunar infarct. Additional hypodensity in the supratentorial white matter likely reflects sequela of underlying chronic small-vessel ischemic change. The pituitary and suprasellar region are normal. There is no mass lesion. There is no mass effect or midline shift. Vascular: No hyperdense vessel or unexpected calcification. Skull: Normal. Negative for fracture or focal lesion. Sinuses/Orbits: The paranasal  sinuses and mastoid air cells are clear. There is a remote fracture of the left lamina papyracea. The globes and orbits are otherwise unremarkable. Other: There is swelling in the suboccipital soft tissues to the left of midline likely reflecting hematoma/contusion. CT CERVICAL SPINE FINDINGS Alignment: Normal. Skull base and vertebrae: Skull base alignment is maintained. Vertebral body heights are preserved. There is no evidence of acute fracture there is no suspicious osseous lesion. Soft tissues and spinal canal: No prevertebral fluid or swelling. No visible canal hematoma. Disc levels: There is disc space narrowing and degenerative endplate change at C3-C4 and C5-C6. There is no evidence of high-grade spinal canal stenosis. Upper chest: The imaged lung apices are clear. Other: None. IMPRESSION: 1. No acute intracranial pathology. 2. No acute fracture or traumatic malalignment of the cervical spine. 3. Suboccipital hematoma/contusion. Electronically Signed   By: Lesia Hausen M.D.   On: 04/08/2023 19:18   CT Cervical Spine Wo Contrast  Result Date: 04/08/2023 CLINICAL DATA:  Fall EXAM: CT HEAD WITHOUT CONTRAST CT CERVICAL SPINE WITHOUT CONTRAST TECHNIQUE: Multidetector CT imaging of the head and cervical spine was performed following the standard protocol without intravenous contrast. Multiplanar CT image reconstructions of the cervical spine were also generated. RADIATION DOSE REDUCTION: This exam was performed according to the departmental dose-optimization program which includes automated exposure control, adjustment of the mA and/or kV according to patient size and/or use of iterative reconstruction technique. COMPARISON:  None Available. FINDINGS: CT HEAD FINDINGS Brain: There is no acute intracranial hemorrhage, extra-axial fluid collection, or acute infarct Parenchymal volume is normal. The ventricles are normal in size. Gray-white differentiation is preserved. A focus of hypodensity in the left basal  ganglia is consistent with a prominent perivascular space versus remote lacunar infarct. Additional hypodensity in the supratentorial white matter likely reflects sequela of underlying chronic small-vessel ischemic change. The pituitary and suprasellar region are normal. There is no mass lesion. There is no mass effect or midline shift. Vascular: No hyperdense vessel or unexpected calcification. Skull: Normal. Negative for fracture or focal lesion. Sinuses/Orbits: The paranasal sinuses and mastoid air cells are clear. There is a remote fracture of the left lamina papyracea. The globes and orbits are otherwise unremarkable. Other: There is swelling in the suboccipital soft tissues to the left of midline likely reflecting hematoma/contusion. CT CERVICAL SPINE FINDINGS Alignment: Normal. Skull base and vertebrae: Skull base alignment is maintained. Vertebral body heights are preserved. There is no evidence of acute fracture there is no suspicious osseous lesion. Soft tissues and spinal canal: No prevertebral fluid or swelling. No visible canal hematoma. Disc levels: There is disc space narrowing and degenerative endplate change at C3-C4 and C5-C6. There is no evidence of high-grade spinal canal stenosis. Upper chest: The imaged lung apices are clear. Other: None. IMPRESSION: 1. No acute intracranial pathology. 2. No acute fracture or traumatic malalignment of the cervical spine. 3. Suboccipital hematoma/contusion. Electronically Signed   By: Lesia Hausen M.D.   On: 04/08/2023 19:18    Scheduled Meds:  aspirin  81 mg Oral Daily   atorvastatin  10 mg Oral QPM   ferrous sulfate  325 mg Oral Q breakfast   heparin  5,000 Units Subcutaneous Q8H   insulin aspart  0-9 Units Subcutaneous TID WC   Continuous Infusions:  cefTRIAXone (ROCEPHIN)  IV Stopped (04/09/23 0034)     LOS: 0 days   Burnadette Pop, MD [facility] P6/02/2023, 7:47 AM

## 2023-04-09 NOTE — Evaluation (Signed)
Physical Therapy Evaluation Patient Details Name: Scott Greer MRN: [id number] DOB: 11-11-1955 Today's Date: 04/09/2023  History of Present Illness  67 yo male presents to [facility] on 6/3 with fall out of w/c x2. CTH, other imaging negative. Workup for acute on chronic CKD. PMH includes bilat BKA 2022, CKDIII, HF, DMII with polyneuropathy, HTN, HLD, anemia.  Clinical Impression   Pt presents with generalized weakness, pain in neck and head from fall, impaired balance with recent falls x3, min difficulty transferring to/from w/c. Pt to benefit from acute PT to address deficits. Pt requiring light steadying and w/c management assist for transfer to/from w/c, at baseline pt reports independence with this. Pt adamant he wants to return to shelter at d/c as he has people working on getting him low income housing. PT to progress mobility as tolerated, and will continue to follow acutely.         Recommendations for follow up therapy are one component of a multi-disciplinary discharge planning process, led by the attending physician.  Recommendations may be updated based on patient status, additional functional criteria and insurance authorization.  Follow Up Recommendations       Assistance Recommended at Discharge Intermittent Supervision/Assistance  Patient can return home with the following  A little help with walking and/or transfers;A little help with bathing/dressing/bathroom    Equipment Recommendations None recommended by PT  Recommendations for Other Services       Functional Status Assessment Patient has had a recent decline in their functional status and demonstrates the ability to make significant improvements in function in a reasonable and predictable amount of time.     Precautions / Restrictions Precautions Precautions: Fall Precaution Comments: bilat BKA      Mobility  Bed Mobility Overal bed mobility: Needs Assistance Bed Mobility: Supine to Sit, Sit to Supine      Supine to sit: Supervision Sit to supine: Supervision   General bed mobility comments: for safety, increased time with cues for sequencing    Transfers Overall transfer level: Needs assistance Equipment used: None Transfers: Bed to chair/wheelchair/BSC            Lateral/Scoot Transfers: Min assist General transfer comment: assist for scoot pivot towards R then L from bed<>w/c, cues for safety including locking w/c and hand placement. PT assisting with power up and safe pivot. pt touching down with LLE on ground during pivot to recliner, which pt states he does at baseline sometimes.    Ambulation/Gait                  Stairs            Wheelchair Mobility    Modified Rankin (Stroke Patients Only)       Balance Overall balance assessment: Needs assistance, History of Falls Sitting-balance support: No upper extremity supported Sitting balance-Leahy Scale: Fair                                       Pertinent Vitals/Pain Pain Assessment Pain Assessment: Faces Faces Pain Scale: Hurts little more Pain Location: R side head Pain Descriptors / Indicators: Sore Pain Intervention(s): Limited activity within patient's tolerance, Monitored during session, Repositioned    Home Living Family/patient expects to be discharged to:: Shelter/Homeless                   Additional Comments: [facility]    Prior Function Prior Level of Function : Independent/Modified Independent;History of Falls (last six months)             Mobility Comments: pt reports independence with transfers typically, x3 falls in the past week two of which pt reports due to chair flipping over backwards ADLs Comments: pt reports independence with ADLs typically     Hand Dominance   Dominant Hand: Right    Extremity/Trunk Assessment   Upper Extremity Assessment Upper Extremity Assessment: Defer to OT  evaluation    Lower Extremity Assessment Lower Extremity Assessment: Generalized weakness (bilat BKA)    Cervical / Trunk Assessment Cervical / Trunk Assessment: Normal  Communication   Communication: No difficulties  Cognition Arousal/Alertness: Awake/alert Behavior During Therapy: WFL for tasks assessed/performed Overall Cognitive Status: No family/caregiver present to determine baseline cognitive functioning Area of Impairment: Problem solving, Safety/judgement                         Safety/Judgement: Decreased awareness of deficits, Decreased awareness of safety   Problem Solving: Difficulty sequencing, Requires verbal cues, Requires tactile cues General Comments: pt does not understand why PT needs to evaluate him, even given multiple recent falls and feeling weaker vs baseline.        General Comments      Exercises     Assessment/Plan    PT Assessment Patient needs continued PT services  PT Problem List Decreased strength;Decreased mobility;Decreased activity tolerance;Decreased balance;Decreased knowledge of use of DME;Pain;Decreased safety awareness       PT Treatment Interventions DME instruction;Therapeutic activities;Gait training;Therapeutic exercise;Patient/family education;Balance training;Stair training;Functional mobility training;Neuromuscular re-education    PT Goals (Current goals can be found in the Care Plan section)  Acute Rehab PT Goals PT Goal Formulation: With patient Time For Goal Achievement: 04/23/23 Potential to Achieve Goals: Good    Frequency Min 3X/week     Co-evaluation               AM-PAC PT "6 Clicks" Mobility  Outcome Measure Help needed turning from your back to your side while in a flat bed without using bedrails?: A Little Help needed moving from lying on your back to sitting on the side of a flat bed without using bedrails?: A Little Help needed moving to and from a bed to a chair (including a  wheelchair)?: A Little Help needed standing up from a chair using your arms (e.g., wheelchair or bedside chair)?: Total Help needed to walk in hospital room?: Total Help needed climbing 3-5 steps with a railing? : Total 6 Click Score: 12    End of Session   Activity Tolerance: Patient tolerated treatment well;Patient limited by fatigue Patient left: in bed;with call bell/phone within reach;with bed alarm set Nurse Communication: Mobility status PT Visit Diagnosis: Other abnormalities of gait and mobility (R26.89);History of falling (Z91.81)    Time: 4098-1191 PT Time Calculation (min) (ACUTE ONLY): 21 min   Charges:   PT Evaluation $PT Eval Low Complexity: 1 Low          Copelyn Widmer S, PT DPT Acute Rehabilitation Services Secure Chat Preferred  Office [phone]   Otis Burress E Christain [place] 04/09/2023, 10:51 AM

## 2023-04-10 ENCOUNTER — Other Ambulatory Visit: Payer: Self-pay

## 2023-04-10 ENCOUNTER — Encounter: Payer: Self-pay | Admitting: Internal Medicine

## 2023-04-10 DIAGNOSIS — E113599 Type 2 diabetes mellitus with proliferative diabetic retinopathy without macular edema, unspecified eye: Secondary | ICD-10-CM | POA: Insufficient documentation

## 2023-04-10 DIAGNOSIS — N1831 Chronic kidney disease, stage 3a: Secondary | ICD-10-CM | POA: Diagnosis not present

## 2023-04-10 DIAGNOSIS — N179 Acute kidney failure, unspecified: Secondary | ICD-10-CM | POA: Diagnosis not present

## 2023-04-10 LAB — BASIC METABOLIC PANEL
Anion gap: 9 (ref 5–15)
BUN: 32 mg/dL — ABNORMAL HIGH (ref 8–23)
CO2: 22 mmol/L (ref 22–32)
Calcium: 8.7 mg/dL — ABNORMAL LOW (ref 8.9–10.3)
Chloride: 107 mmol/L (ref 98–111)
Creatinine, Ser: 1.84 mg/dL — ABNORMAL HIGH (ref 0.61–1.24)
GFR, Estimated: 40 mL/min — ABNORMAL LOW (ref >60)
Glucose, Bld: 132 mg/dL — ABNORMAL HIGH (ref 70–99)
Potassium: 4.4 mmol/L (ref 3.5–5.1)
Sodium: 138 mmol/L (ref 135–145)

## 2023-04-10 LAB — GLUCOSE, CAPILLARY
Glucose-Capillary: 123 mg/dL — ABNORMAL HIGH (ref 70–99)
Glucose-Capillary: 142 mg/dL — ABNORMAL HIGH (ref 70–99)

## 2023-04-10 MED ORDER — GUAIFENESIN ER 600 MG PO TB12
600.0000 mg | ORAL_TABLET | Freq: Two times a day (BID) | ORAL | Status: DC
  Administered 2023-04-10: 600 mg via ORAL
  Filled 2023-04-10: qty 1

## 2023-04-10 MED ORDER — GUAIFENESIN ER 600 MG PO TB12
600.0000 mg | ORAL_TABLET | Freq: Two times a day (BID) | ORAL | 0 refills | Status: AC
  Filled 2023-04-10: qty 14, 7d supply, fill #0

## 2023-04-10 MED ORDER — TORSEMIDE 10 MG PO TABS
10.0000 mg | ORAL_TABLET | Freq: Every day | ORAL | 1 refills | Status: DC
Start: 2023-04-10 — End: 2024-05-19
  Filled 2023-04-10: qty 30, 30d supply, fill #0

## 2023-04-10 NOTE — Discharge Summary (Signed)
Physician Discharge Summary  Scott Greer ZHY:[id number] DOB: 26-Jul-1956 DOA: 04/08/2023  PCP: [name], MD  Admit date: 04/08/2023 Discharge date: 04/10/2023  Admitted From: Shelter Disposition:  Shelter  Discharge Condition:Stable CODE STATUS:FULL Diet recommendation: Heart Healthy   Brief/Interim Summary:  Patient is a 67 year old male with history of CKD stage IIIb with creatinine in the range of 1.6-2.1, chronic diastolic CHF, diabetes type 2 with peripheral neuropathy, hypertension, hyperlipidemia who presented with generalized weakness.  Reported 3 to 4 days of diminished oral intake.  He has history of bilateral BKA but is able to independently transfer from his wheelchair.  On presentation, he was mildly hypotensive which improved with IV fluid.  Lab work showed creatinine of 3, hemoglobin of 9.  CT did not show any acute intracranial findings.  Kidney function improved with IV fluid.  PT/OT recommending SNF on discharge but he declined and wants to be discharged back to his shelter.  TOC was consulted and following.  Medically stable for discharge.  Following problems were addressed during the hospitalization:  AKI on CKD stage IIIb: Baseline creatinine ranging from 1.6-2.1.  Presented with creatinine in the range of 3.  Appeared dehydrated on presentation.  Started on IV fluids.  Now kidney function is back to baseline.  Creatinine of 1.8 today   generalized weakness: Presented with 2 to 3 days of generalized weakness.  Patient is status post bilateral BKA.  Ambulates with the help of wheelchair.  Report of fall, 2 episodes over the last 2 days.  CT head, cervical CT did not show any acute fracture or dislocation.  PT/OT consulted, recommended SNF but patient declines.  We recommend outpatient follow-up with PT/OT   Suspected UTI: UA was suspicious for UTI.  No report of dysuria.  No fever or leukocytosis.  Started on ceftriaxone.  Urine culture showed multiple species,  antibiotics discontinued  History of chronic diastolic CHF: Last echo has shown EF of 55 to 60%, grade 1 diastolic dysfunction.  Appeared  dehydrated on presentation and was given IV fluids.  He was on multiple medications at home including digoxin, Coreg, spironolactone, BiDil.  Patient is currently normotensive without any medications.  He has history of systolic heart failure in the past but recent echo showed normal ejection fraction.  These medications will be discontinued.  Continue torsemide 10 mg daily at home   Diabetes type 2: Takes glimepiride, empagliflozin at home.  Recent A1c of 7.3.  Takes gabapentin for diabetic neuropathy.     Hypertension: Currently normotensive.  Presented with hypotension.  Takes Coreg, spironolactone, torsemide, Entresto, BiDil at home.  These medicines discontinued.  Hypotension could have contributed to the AKI   Hyperlipidemia: On statin   Chronic normocytic anemia: Baseline hemoglobin ranges from 8-11.  No evidence of acute blood loss.  Continue iron supplementation  Cough: Lungs clear on auscultation.  On room air.  Last chest x-ray showed no acute findings.  Continue Mucinex. Patient counseled to stop smoking   Debility: Patient is status post bilateral BKA.  Currently living at a shelter  Discharge Diagnoses:  Principal Problem:   Acute renal failure superimposed on stage 3a chronic kidney disease (HCC) Active Problems:   DM2 (diabetes mellitus, type 2) (HCC)   Essential hypertension   Hyperlipidemia   AKI (acute kidney injury) (HCC)   Chronic diastolic CHF (congestive heart failure) (HCC)   Generalized weakness   Dehydration   Fall at home, initial encounter   Acute cystitis   History of anemia  due to chronic kidney disease    Discharge Instructions  Discharge Instructions     Ambulatory referral to Physical Therapy   Complete by: As directed    Iontophoresis - 4 mg/ml of dexamethasone: No   T.E.N.S. Unit Evaluation and Dispense  as Indicated: No   Diet - low sodium heart healthy   Complete by: As directed    Discharge instructions   Complete by: As directed    1)Please take prescribed medications as instructed 2)Monitor your blood pressure  3)Follow up with your PCP in a week.   Increase activity slowly   Complete by: As directed       Allergies as of 04/10/2023       Reactions   Bee Venom Hives, Itching, Swelling        Medication List     STOP taking these medications    carvedilol 3.125 MG tablet Commonly known as: COREG   digoxin 0.125 MG tablet Commonly known as: LANOXIN   Entresto 24-26 MG Generic drug: sacubitril-valsartan   spironolactone 25 MG tablet Commonly known as: ALDACTONE       TAKE these medications    aspirin 81 MG chewable tablet Commonly known as: Aspirin Low Dose CHEW ONE TABLET BY MOUTH ONCE DAILY Needs appointment for further refills   atorvastatin 10 MG tablet Commonly known as: LIPITOR Take 1 tablet (10 mg total) by mouth every evening.   BiDil 20-37.5 MG tablet Generic drug: isosorbide-hydrALAZINE Take 1 tablet by mouth 2 (two) times daily.   ferrous sulfate 325 (65 FE) MG tablet Commonly known as: FeroSul TAKE ONE TABLET BY MOUTH EVERY MORNING WITH BREAKFAST   gabapentin 300 MG capsule Commonly known as: NEURONTIN Take 1 capsule (300 mg total) by mouth 2 (two) times daily.   glimepiride 1 MG tablet Commonly known as: AMARYL Take 1 tablet (1 mg total) by mouth daily with breakfast.   guaiFENesin 600 MG 12 hr tablet Commonly known as: MUCINEX Take 1 tablet (600 mg total) by mouth 2 (two) times daily for 7 days.   Jardiance 10 MG Tabs tablet Generic drug: empagliflozin Take 1 tablet (10 mg total) by mouth daily at 12 noon.   torsemide 10 MG tablet Commonly known as: DEMADEX Take 1 tablet (10 mg total) by mouth daily. What changed:  medication strength how much to take        Follow-up Information     [facility]. Call.   Specialty: Rehabilitation Why: Please call the Rehabilitation Center to schedule OUtpatient therapy sessions. Contact information: [address] [id number] mc [place] [address] [phone]        [name], MD. Schedule an appointment as soon as possible for a visit in 1 week(s).   Specialty: Internal Medicine Contact information: [address] [place] [address] [place] [address] [phone]                Allergies  Allergen Reactions   Bee Venom Hives, Itching and Swelling    Consultations: none   Procedures/Studies: DG Pelvis Portable  Result Date: 04/08/2023 CLINICAL DATA:  Two falls yesterday. EXAM: PORTABLE PELVIS 1-2 VIEWS COMPARISON:  None Available. FINDINGS: There is no evidence of pelvic fracture or diastasis. Mild to moderate severity degenerative changes are seen in the form of joint space narrowing, acetabular sclerosis and lateral acetabular bony spurring. No pelvic bone lesions are seen. IMPRESSION: No acute osseous abnormality. Electronically Signed   By: Aram Candela  M.D.   On: 04/08/2023 19:42   DG Shoulder Right Port  Result Date: 04/08/2023 CLINICAL DATA:  Status post fall. EXAM: RIGHT SHOULDER - 1 VIEW COMPARISON:  Mar 13, 2022 FINDINGS: There is no evidence of fracture or dislocation. Moderate to marked severity degenerative changes are seen involving the right acromioclavicular joint and right glenohumeral articulation. Soft tissues are unremarkable. IMPRESSION: Moderate to marked severity degenerative changes without evidence of acute fracture or dislocation. Electronically Signed   By: Aram Candela M.D.   On: 04/08/2023 19:40   DG Chest Portable 1 View  Result Date: 04/08/2023 CLINICAL DATA:  Multiple recent falls. EXAM: PORTABLE CHEST 1 VIEW COMPARISON:  October 15, 2022 FINDINGS: The heart size and mediastinal contours are within normal limits. Both lungs are  clear. Degenerative changes seen involving both shoulders. No acute osseous abnormalities are identified. IMPRESSION: No active disease. Electronically Signed   By: Aram Candela M.D.   On: 04/08/2023 19:38   CT Head Wo Contrast  Result Date: 04/08/2023 CLINICAL DATA:  Fall EXAM: CT HEAD WITHOUT CONTRAST CT CERVICAL SPINE WITHOUT CONTRAST TECHNIQUE: Multidetector CT imaging of the head and cervical spine was performed following the standard protocol without intravenous contrast. Multiplanar CT image reconstructions of the cervical spine were also generated. RADIATION DOSE REDUCTION: This exam was performed according to the departmental dose-optimization program which includes automated exposure control, adjustment of the mA and/or kV according to patient size and/or use of iterative reconstruction technique. COMPARISON:  None Available. FINDINGS: CT HEAD FINDINGS Brain: There is no acute intracranial hemorrhage, extra-axial fluid collection, or acute infarct Parenchymal volume is normal. The ventricles are normal in size. Gray-white differentiation is preserved. A focus of hypodensity in the left basal ganglia is consistent with a prominent perivascular space versus remote lacunar infarct. Additional hypodensity in the supratentorial white matter likely reflects sequela of underlying chronic small-vessel ischemic change. The pituitary and suprasellar region are normal. There is no mass lesion. There is no mass effect or midline shift. Vascular: No hyperdense vessel or unexpected calcification. Skull: Normal. Negative for fracture or focal lesion. Sinuses/Orbits: The paranasal sinuses and mastoid air cells are clear. There is a remote fracture of the left lamina papyracea. The globes and orbits are otherwise unremarkable. Other: There is swelling in the suboccipital soft tissues to the left of midline likely reflecting hematoma/contusion. CT CERVICAL SPINE FINDINGS Alignment: Normal. Skull base and vertebrae:  Skull base alignment is maintained. Vertebral body heights are preserved. There is no evidence of acute fracture there is no suspicious osseous lesion. Soft tissues and spinal canal: No prevertebral fluid or swelling. No visible canal hematoma. Disc levels: There is disc space narrowing and degenerative endplate change at C3-C4 and C5-C6. There is no evidence of high-grade spinal canal stenosis. Upper chest: The imaged lung apices are clear. Other: None. IMPRESSION: 1. No acute intracranial pathology. 2. No acute fracture or traumatic malalignment of the cervical spine. 3. Suboccipital hematoma/contusion. Electronically Signed   By: Lesia Hausen M.D.   On: 04/08/2023 19:18   CT Cervical Spine Wo Contrast  Result Date: 04/08/2023 CLINICAL DATA:  Fall EXAM: CT HEAD WITHOUT CONTRAST CT CERVICAL SPINE WITHOUT CONTRAST TECHNIQUE: Multidetector CT imaging of the head and cervical spine was performed following the standard protocol without intravenous contrast. Multiplanar CT image reconstructions of the cervical spine were also generated. RADIATION DOSE REDUCTION: This exam was performed according to the departmental dose-optimization program which includes automated exposure control, adjustment of the mA and/or kV according to patient  size and/or use of iterative reconstruction technique. COMPARISON:  None Available. FINDINGS: CT HEAD FINDINGS Brain: There is no acute intracranial hemorrhage, extra-axial fluid collection, or acute infarct Parenchymal volume is normal. The ventricles are normal in size. Gray-white differentiation is preserved. A focus of hypodensity in the left basal ganglia is consistent with a prominent perivascular space versus remote lacunar infarct. Additional hypodensity in the supratentorial white matter likely reflects sequela of underlying chronic small-vessel ischemic change. The pituitary and suprasellar region are normal. There is no mass lesion. There is no mass effect or midline shift.  Vascular: No hyperdense vessel or unexpected calcification. Skull: Normal. Negative for fracture or focal lesion. Sinuses/Orbits: The paranasal sinuses and mastoid air cells are clear. There is a remote fracture of the left lamina papyracea. The globes and orbits are otherwise unremarkable. Other: There is swelling in the suboccipital soft tissues to the left of midline likely reflecting hematoma/contusion. CT CERVICAL SPINE FINDINGS Alignment: Normal. Skull base and vertebrae: Skull base alignment is maintained. Vertebral body heights are preserved. There is no evidence of acute fracture there is no suspicious osseous lesion. Soft tissues and spinal canal: No prevertebral fluid or swelling. No visible canal hematoma. Disc levels: There is disc space narrowing and degenerative endplate change at C3-C4 and C5-C6. There is no evidence of high-grade spinal canal stenosis. Upper chest: The imaged lung apices are clear. Other: None. IMPRESSION: 1. No acute intracranial pathology. 2. No acute fracture or traumatic malalignment of the cervical spine. 3. Suboccipital hematoma/contusion. Electronically Signed   By: Lesia Hausen M.D.   On: 04/08/2023 19:18   ECHOCARDIOGRAM COMPLETE  Result Date: 03/25/2023    ECHOCARDIOGRAM REPORT   Patient Name:   AENEAS BEECHAM Date of Exam: 03/25/2023 Medical Rec #:  [id number]      Height:       75.0 in Accession #:    [id number]     Weight:       240.0 lb Date of Birth:  28-May-1956     BSA:          2.371 m Patient Age:    66 years       BP:           125/64 mmHg Patient Gender: M              HR:           63 bpm. Exam Location:  Outpatient Procedure: 2D Echo, Color Doppler and Cardiac Doppler Indications:    I50.22 Chronic systolic (congestive) heart failure  History:        Patient has prior history of Echocardiogram examinations, most                 recent 12/26/2020. CHF; Risk Factors:Hypertension, Diabetes and                 Dyslipidemia.  Sonographer:    Irving Burton Senior RDCS  Referring Phys: [phone] [facility]  Sonographer Comments: Suboptimal parasternal window. IMPRESSIONS  1. Left ventricular ejection fraction, by estimation, is 55 to 60%. The left ventricle has normal function. The left ventricle has no regional wall motion abnormalities. Left ventricular diastolic parameters are consistent with Grade I diastolic dysfunction (impaired relaxation).  2. Right ventricular systolic function is normal. The right ventricular size is normal. Tricuspid regurgitation signal is inadequate for assessing PA pressure.  3. The mitral valve is normal in structure. No evidence of mitral valve regurgitation.  4. The aortic valve is tricuspid. Aortic  valve regurgitation is not visualized.  5. The inferior vena cava is normal in size with greater than 50% respiratory variability, suggesting right atrial pressure of 3 mmHg. FINDINGS  Left Ventricle: Left ventricular ejection fraction, by estimation, is 55 to 60%. The left ventricle has normal function. The left ventricle has no regional wall motion abnormalities. The left ventricular internal cavity size was normal in size. There is  no left ventricular hypertrophy. Left ventricular diastolic parameters are consistent with Grade I diastolic dysfunction (impaired relaxation). Right Ventricle: The right ventricular size is normal. Right ventricular systolic function is normal. Tricuspid regurgitation signal is inadequate for assessing PA pressure. Left Atrium: Left atrial size was normal in size. Right Atrium: Right atrial size was normal in size. Pericardium: There is no evidence of pericardial effusion. Mitral Valve: The mitral valve is normal in structure. No evidence of mitral valve regurgitation. Tricuspid Valve: The tricuspid valve is normal in structure. Tricuspid valve regurgitation is trivial. Aortic Valve: The aortic valve is tricuspid. Aortic valve regurgitation is not visualized. Pulmonic Valve: The pulmonic valve was grossly normal.  Pulmonic valve regurgitation is not visualized. Aorta: The aortic root is normal in size and structure and the ascending aorta was not well visualized. Venous: The inferior vena cava is normal in size with greater than 50% respiratory variability, suggesting right atrial pressure of 3 mmHg. IAS/Shunts: No atrial level shunt detected by color flow Doppler.  LEFT VENTRICLE PLAX 2D LVIDd:         4.50 cm   Diastology LVIDs:         3.30 cm   LV e' medial:    6.85 cm/s LV PW:         1.00 cm   LV E/e' medial:  9.0 LV IVS:        0.90 cm   LV e' lateral:   10.00 cm/s LVOT diam:     2.30 cm   LV E/e' lateral: 6.2 LV SV:         74 LV SV Index:   31 LVOT Area:     4.15 cm  RIGHT VENTRICLE RV S prime:     10.20 cm/s TAPSE (M-mode): 1.9 cm LEFT ATRIUM             Index        RIGHT ATRIUM           Index LA diam:        3.30 cm 1.39 cm/m   RA Area:     13.50 cm LA Vol (A2C):   32.9 ml 13.87 ml/m  RA Volume:   31.50 ml  13.28 ml/m LA Vol (A4C):   52.6 ml 22.18 ml/m LA Biplane Vol: 41.6 ml 17.54 ml/m  AORTIC VALVE LVOT Vmax:   93.40 cm/s LVOT Vmean:  66.500 cm/s LVOT VTI:    0.179 m  AORTA Ao Root diam: 3.00 cm MITRAL VALVE MV Area (PHT): 2.64 cm    SHUNTS MV Decel Time: 287 msec    Systemic VTI:  0.18 m MV E velocity: 61.80 cm/s  Systemic Diam: 2.30 cm MV A velocity: 46.60 cm/s MV E/A ratio:  1.33 Olga Millers MD Electronically signed by Olga Millers MD Signature Date/Time: 03/25/2023/3:26:45 PM    Final       Subjective: Patient seen and examined at bedside today.  Hemodynamically stable comfortable.  His only complaint is some cough.  Lungs are clear on auscultation today.  He is on room air.  Medically stable  for discharge  Discharge Exam: Vitals:   04/10/23 0525 04/10/23 0808  BP: 132/64 130/64  Pulse: 63 (!) 57  Resp: 18 18  Temp: 98.2 F (36.8 C) (!) 97.3 F (36.3 C)  SpO2: 100% 100%   Vitals:   04/09/23 2128 04/10/23 0500 04/10/23 0525 04/10/23 0808  BP: 137/81  132/64 130/64  Pulse: 71   63 (!) 57  Resp: 18  18 18   Temp: 98.3 F (36.8 C)  98.2 F (36.8 C) (!) 97.3 F (36.3 C)  TempSrc: Oral  Oral Oral  SpO2: 99%  100% 100%  Weight:  93.1 kg      General: Pt is alert, awake, not in acute distress Cardiovascular: RRR, S1/S2 +, no rubs, no gallops Respiratory: CTA bilaterally, no wheezing, no rhonchi Abdominal: Soft, NT, ND, bowel sounds + Extremities: Bilateral BKA    The results of significant diagnostics from this hospitalization (including imaging, microbiology, ancillary and laboratory) are listed below for reference.     Microbiology: Recent Results (from the past 240 hour(s))  Resp panel by RT-PCR (RSV, Flu A&B, Covid) Anterior Nasal Swab     Status: None   Collection Time: 04/08/23  3:10 PM   Specimen: Anterior Nasal Swab  Result Value Ref Range Status   SARS Coronavirus 2 by RT PCR NEGATIVE NEGATIVE Final   Influenza A by PCR NEGATIVE NEGATIVE Final   Influenza B by PCR NEGATIVE NEGATIVE Final    Comment: (NOTE) The Xpert Xpress SARS-CoV-2/FLU/RSV plus assay is intended as an aid in the diagnosis of influenza from Nasopharyngeal swab specimens and should not be used as a sole basis for treatment. Nasal washings and aspirates are unacceptable for Xpert Xpress SARS-CoV-2/FLU/RSV testing.  Fact Sheet for Patients: BloggerCourse.com  Fact Sheet for Healthcare Providers: SeriousBroker.it  This test is not yet approved or cleared by the [place] FDA and has been authorized for detection and/or diagnosis of SARS-CoV-2 by FDA under an Emergency Use Authorization (EUA). This EUA will remain in effect (meaning this test can be used) for the duration of the COVID-19 declaration under Section 564(b)(1) of the Act, 21 U.S.C. section 360bbb-3(b)(1), unless the authorization is terminated or revoked.     Resp Syncytial Virus by PCR NEGATIVE NEGATIVE Final    Comment: (NOTE) Fact Sheet for  Patients: BloggerCourse.com  Fact Sheet for Healthcare Providers: SeriousBroker.it  This test is not yet approved or cleared by the [place] FDA and has been authorized for detection and/or diagnosis of SARS-CoV-2 by FDA under an Emergency Use Authorization (EUA). This EUA will remain in effect (meaning this test can be used) for the duration of the COVID-19 declaration under Section 564(b)(1) of the Act, 21 U.S.C. section 360bbb-3(b)(1), unless the authorization is terminated or revoked.  Performed at [facility], [address] [place], [place] [address]   Urine Culture     Status: Abnormal   Collection Time: 04/08/23  8:00 PM   Specimen: Urine, Random  Result Value Ref Range Status   Specimen Description URINE, RANDOM  Final   Special Requests   Final    NONE Reflexed from [id number] Performed at [facility], [address], [place], [place] [address]    Culture MULTIPLE SPECIES PRESENT, SUGGEST RECOLLECTION (A)  Final   Report Status 04/09/2023 FINAL  Final     Labs: BNP (last 3 results) Recent Labs    02/11/23 1214  BNP 43.2   Basic Metabolic Panel: Recent Labs  Lab 04/08/23 1500  04/08/23 2150 04/09/23 0726 04/10/23 0800  NA 136  --  140 138  K 4.3  --  4.3 4.4  CL 102  --  106 107  CO2 22  --  23 22  GLUCOSE 207*  --  107* 132*  BUN 59*  --  49* 32*  CREATININE 3.00*  --  2.50* 1.84*  CALCIUM 9.0  --  9.0 8.7*  MG  --  2.3  --   --    Liver Function Tests: Recent Labs  Lab 04/09/23 0726  AST 14*  ALT 11  ALKPHOS 40  BILITOT 0.3  PROT 6.4*  ALBUMIN 3.4*   No results for input(s): "LIPASE", "AMYLASE" in the last 168 hours. No results for input(s): "AMMONIA" in the last 168 hours. CBC: Recent Labs  Lab 04/08/23 1500 04/09/23 0726  WBC 9.0 6.3  NEUTROABS  --  4.2  HGB 9.0* 8.8*  HCT 28.3* 27.1*  MCV 86.5 86.9  PLT 273 267   Cardiac Enzymes: Recent Labs  Lab  04/08/23 2150  CKTOTAL 227   BNP: Invalid input(s): "POCBNP" CBG: Recent Labs  Lab 04/09/23 0814 04/09/23 1223 04/09/23 1615 04/09/23 2130 04/10/23 0809  GLUCAP 101* 242* 174* 218* 123*   D-Dimer No results for input(s): "DDIMER" in the last 72 hours. Hgb A1c No results for input(s): "HGBA1C" in the last 72 hours. Lipid Profile No results for input(s): "CHOL", "HDL", "LDLCALC", "TRIG", "CHOLHDL", "LDLDIRECT" in the last 72 hours. Thyroid function studies Recent Labs    04/09/23 0726  TSH 1.345   Anemia work up No results for input(s): "VITAMINB12", "FOLATE", "FERRITIN", "TIBC", "IRON", "RETICCTPCT" in the last 72 hours. Urinalysis    Component Value Date/Time   COLORURINE YELLOW 04/08/2023 2000   APPEARANCEUR CLOUDY (A) 04/08/2023 2000   LABSPEC 1.010 04/08/2023 2000   PHURINE 5.0 04/08/2023 2000   GLUCOSEU 150 (A) 04/08/2023 2000   HGBUR NEGATIVE 04/08/2023 2000   BILIRUBINUR NEGATIVE 04/08/2023 2000   BILIRUBINUR negative 10/30/2022 1405   KETONESUR NEGATIVE 04/08/2023 2000   PROTEINUR 100 (A) 04/08/2023 2000   UROBILINOGEN 0.2 10/30/2022 1405   NITRITE NEGATIVE 04/08/2023 2000   LEUKOCYTESUR LARGE (A) 04/08/2023 2000   Sepsis Labs Recent Labs  Lab 04/08/23 1500 04/09/23 0726  WBC 9.0 6.3   Microbiology Recent Results (from the past 240 hour(s))  Resp panel by RT-PCR (RSV, Flu A&B, Covid) Anterior Nasal Swab     Status: None   Collection Time: 04/08/23  3:10 PM   Specimen: Anterior Nasal Swab  Result Value Ref Range Status   SARS Coronavirus 2 by RT PCR NEGATIVE NEGATIVE Final   Influenza A by PCR NEGATIVE NEGATIVE Final   Influenza B by PCR NEGATIVE NEGATIVE Final    Comment: (NOTE) The Xpert Xpress SARS-CoV-2/FLU/RSV plus assay is intended as an aid in the diagnosis of influenza from Nasopharyngeal swab specimens and should not be used as a sole basis for treatment. Nasal washings and aspirates are unacceptable for Xpert Xpress  SARS-CoV-2/FLU/RSV testing.  Fact Sheet for Patients: BloggerCourse.com  Fact Sheet for Healthcare Providers: SeriousBroker.it  This test is not yet approved or cleared by the [place] FDA and has been authorized for detection and/or diagnosis of SARS-CoV-2 by FDA under an Emergency Use Authorization (EUA). This EUA will remain in effect (meaning this test can be used) for the duration of the COVID-19 declaration under Section 564(b)(1) of the Act, 21 U.S.C. section 360bbb-3(b)(1), unless the authorization is terminated or revoked.  Resp Syncytial Virus by PCR NEGATIVE NEGATIVE Final    Comment: (NOTE) Fact Sheet for Patients: BloggerCourse.com  Fact Sheet for Healthcare Providers: SeriousBroker.it  This test is not yet approved or cleared by the [place] FDA and has been authorized for detection and/or diagnosis of SARS-CoV-2 by FDA under an Emergency Use Authorization (EUA). This EUA will remain in effect (meaning this test can be used) for the duration of the COVID-19 declaration under Section 564(b)(1) of the Act, 21 U.S.C. section 360bbb-3(b)(1), unless the authorization is terminated or revoked.  Performed at [facility], [address] [place], [place] [address]   Urine Culture     Status: Abnormal   Collection Time: 04/08/23  8:00 PM   Specimen: Urine, Random  Result Value Ref Range Status   Specimen Description URINE, RANDOM  Final   Special Requests   Final    NONE Reflexed from [id number] Performed at [facility], [address] [place], [place] [address]    Culture MULTIPLE SPECIES PRESENT, SUGGEST RECOLLECTION (A)  Final   Report Status 04/09/2023 FINAL  Final    Please note: You were cared for by a hospitalist during your hospital stay. Once you are discharged, your primary care physician will handle any further medical issues.  Please note that NO REFILLS for any discharge medications will be authorized once you are discharged, as it is imperative that you return to your primary care physician (or establish a relationship with a primary care physician if you do not have one) for your post hospital discharge needs so that they can reassess your need for medications and monitor your lab values.    Time coordinating discharge: 40 minutes  SIGNED:   Burnadette Pop, MD  [facility] 04/10/2023, 9:45 AM Pager [phone]  If 7PM-7AM, please contact night-coverage [email or web address] Password [email or web address]

## 2023-04-10 NOTE — Progress Notes (Signed)
Occupational Therapy Treatment Patient Details Name: Scott Greer MRN: [id number] DOB: 21-Feb-1956 Today's Date: 04/10/2023   History of present illness 66 yo male presents [facility] on [facility] with fall out of w/c x2. CTH, other imaging negative. Workup for acute on chronic CKD. PMH includes bilat BKA 2022, CKDIII, HF, DMII with polyneuropathy, HTN, HLD, anemia.   OT comments  Pt making good progress with functional goals. Pt in shower on shower chair upon arrival, NT present. Pt participated in UB/LB bathing with mod I - Sup, lateral scoot transfers from shower chair to w/c and toilet min guard A - Sup. Pt completed UB dressing with clean gown, retrieved toiletry items form bag without physical assist to perform grooming/hygiene and oral care seated at sink.   Recommendations for follow up therapy are one component of a multi-disciplinary discharge planning process, led by the attending physician.  Recommendations may be updated based on patient status, additional functional criteria and insurance authorization.    Assistance Recommended at Discharge PRN  Patient can return home with the following  Assist for transportation;Assistance with cooking/housework   Equipment Recommendations  None recommended by OT    Recommendations for Other Services      Precautions / Restrictions Precautions Precautions: Fall Precaution Comments: bilat BKA w/o prosthetics Restrictions Weight Bearing Restrictions: No       Mobility Bed Mobility               General bed mobility comments: pt in shower on shower chair upon arrival, NT present    Transfers Overall transfer level: Needs assistance Equipment used: Pushed w/c Transfers: Bed to chair/wheelchair/BSC            Lateral/Scoot Transfers: Min guard       Balance Overall balance assessment: Needs assistance, History of Falls Sitting-balance support: No upper extremity supported Sitting balance-Leahy Scale: Fair                                      ADL either performed or assessed with clinical judgement   ADL Overall ADL's : Needs assistance/impaired     Grooming: Wash/dry hands;Wash/dry face;Oral care;Modified independent Grooming Details (indicate cue type and reason): pt retrieved grooming/hygiene items from his bag and propelled w/c to sink with no physical assist Upper Body Bathing: Supervision/ safety;Sitting Upper Body Bathing Details (indicate cue type and reason): in shower on shower chair Lower Body Bathing: Supervison/ safety;Sitting/lateral leans Lower Body Bathing Details (indicate cue type and reason): in shower on shower chair Upper Body Dressing : Modified independent       Toilet Transfer: Supervision/safety Toilet Transfer Details (indicate cue type and reason): lateral scoot Toileting- Clothing Manipulation and Hygiene: Supervision/safety   Tub/ Shower Transfer: Therapist, sports Details (indicate cue type and reason): transferred of shower chair, lateral scoot Functional mobility during ADLs: Min guard A - Supervision/safety      Extremity/Trunk Assessment Upper Extremity Assessment Upper Extremity Assessment: Overall WFL for tasks assessed   Lower Extremity Assessment Lower Extremity Assessment: Defer to PT evaluation   Cervical / Trunk Assessment Cervical / Trunk Assessment: Normal    Vision Ability to See in Adequate Light: 0 Adequate Patient Visual Report: No change from baseline;Other (comment)     Perception     Praxis      Cognition Arousal/Alertness: Awake/alert Behavior During Therapy: WFL for tasks assessed/performed Overall Cognitive Status: No family/caregiver present to determine baseline  cognitive functioning Area of Impairment: Safety/judgement                         Safety/Judgement: Decreased awareness of deficits, Decreased awareness of safety     General Comments: pt with some decreased insight into  benefits of participating with therapy; particular about needs but pleasant and [facility]        Exercises      Shoulder Instructions       General Comments      Pertinent Vitals/ Pain       Pain Assessment Pain Assessment: 0-10 Pain Score: 3  Pain Location: R side of head/neck; R wrist IV site Pain Descriptors / Indicators: Sore Pain Intervention(s): Monitored during session  Home Living                                          Prior Functioning/Environment              Frequency  Min 2X/week        Progress Toward Goals  OT Goals(current goals can now be found in the care plan section)  Progress towards OT goals: Progressing toward goals     Plan Discharge plan remains appropriate    Co-evaluation                 AM-PAC OT "6 Clicks" Daily Activity     Outcome Measure   Help from another person eating meals?: None Help from another person taking care of personal grooming?: None Help from another person toileting, which includes using toliet, bedpan, or urinal?: A Little Help from another person bathing (including washing, rinsing, drying)?: A Little Help from another person to put on and taking off regular upper body clothing?: None Help from another person to put on and taking off regular lower body clothing?: A Little 6 Click Score: 21    End of Session Equipment Utilized During Treatment: Other (comment) (shower chair, w/c)  OT Visit Diagnosis: Other abnormalities of gait and mobility (R26.89);Muscle weakness (generalized) (M62.81);Pain Pain - part of body:  (neck)   Activity Tolerance Patient tolerated treatment well   Patient Left with call bell/phone within reach;in chair (seated at sink)   Nurse Communication Other (comment) (ADL completion)        Time: 1610-9604 OT Time Calculation (min): 34 min  Charges: OT General Charges $OT Visit: 1 Visit OT Treatments $Self Care/Home Management : 8-22 mins $Therapeutic  Activity: 8-22 mins    Galen Manila 04/10/2023, 2:02 PM

## 2023-04-10 NOTE — TOC Transition Note (Addendum)
Transition of Care [facility]) - CM/SW Discharge Note   Patient Details  Name: Scott Greer MRN: [id number] Date of Birth: 08/17/1956  Transition of Care [facility]) CM/SW Contact:  Rhylen Shaheen A [place], Theresia Majors Phone Number: 04/10/2023, 3:24 PM   Clinical Narrative:     Medicaid Transportation did not arrive for pt. Pt became agitated and returned to unit floor and began to interrupt the workflow. CSW contacted [employer] taxi service and provided voucher to pt. Pt then made his way back to the front entrance.   No other TOC needs. TOC will sign off.   Patient will DC to: [facility]  Anticipated DC date: 04/10/23  Family notified: Pt declined   Transport by: Medicaid transportation      Per MD patient ready for DC to [facility] . RN, patient, notified of DC.  DC packet on chart. Medicaid  transport requested for patient.       CSW will sign off for now as social work intervention is no longer needed. Please consult [place] again if new needs arise.   Final next level of care: Homeless Shelter Barriers to Discharge: No Barriers Identified   Patient Goals and CMS Choice      Discharge Placement                  Patient to be transferred to facility by: Medicaid Transportation Name of family member notified: Pt declined Patient and family notified of of transfer: 04/10/23  Discharge Plan and Services Additional resources added to the After Visit Summary for   In-house Referral: Clinical Social Work                                   Social Determinants of Health (SDOH) Interventions SDOH Screenings   Food Insecurity: Food Insecurity Present (04/09/2023)  Housing: High Risk (04/09/2023)  Transportation Needs: Unmet Transportation Needs (04/09/2023)  Utilities: At Risk (04/09/2023)  Alcohol Screen: Low Risk  (10/18/2021)  Depression (PHQ2-9): Medium Risk (12/14/2020)  Financial Resource Strain: High Risk (10/18/2021)  Physical Activity: Inactive (12/11/2021)  Social  Connections: Socially Isolated (10/18/2021)  Stress: Stress Concern Present (02/27/2022)  Tobacco Use: High Risk (04/08/2023)     Readmission Risk Interventions     No data to display

## 2023-04-11 ENCOUNTER — Telehealth: Payer: Self-pay

## 2023-04-11 NOTE — Transitions of Care (Post Inpatient/ED Visit) (Signed)
   04/11/2023  Name: Scott Greer MRN: [id number] DOB: [birth year]  Today's TOC FU Call Status: Today's TOC FU Call Status:: Unsuccessul Call (1st Attempt) Unsuccessful Call (1st Attempt) Date: 04/11/23  Attempted to reach the patient regarding the most recent Inpatient/ED visit.  Follow Up Plan: Additional outreach attempts will be made to reach the patient to complete the Transitions of Care (Post Inpatient/ED visit) call.   Signature   Robyne Peers, RN

## 2023-04-15 ENCOUNTER — Telehealth: Payer: Self-pay

## 2023-04-15 ENCOUNTER — Telehealth (HOSPITAL_COMMUNITY): Payer: Self-pay | Admitting: Cardiology

## 2023-04-15 ENCOUNTER — Encounter: Payer: Self-pay | Admitting: *Deleted

## 2023-04-15 ENCOUNTER — Other Ambulatory Visit (HOSPITAL_COMMUNITY): Payer: Self-pay

## 2023-04-15 NOTE — Congregational Nurse Program (Signed)
  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 Yes    Student Assistance N/A    Location Patient Served  [facility]    Visit Setting with Client Organization;Phone/Text/Email    Patient Status Unhoused    Insurance Medicaid    Insurance/Financial Assistance Referral N/A    Medication Have Medication Insecurities;Provided Medication Assistance    Medical Provider Yes    Screening Referrals Made N/A    Medical Referrals Made N/A    Medical Appointment Made Other    Recently w/o PCP, now 1st time PCP visit completed due to CNs referral or appointment made N/A    Food N/A    Transportation N/A    Housing/Utilities No permanent housing    Interpersonal Safety N/A    Interventions Advocate/Support;Navigate Healthcare System;Case Management;Educate;Reviewed Medications    Abnormal to Normal Screening Since Last CN Visit N/A    Screenings CN Performed N/A    Sent Client to Lab for: N/A    Did client attend any of the following based off CNs referral or appointments made? Medical;Transportation    ED Visit Averted N/A    Life-Saving Intervention Made N/A           Client came to nurse's office for help with medications and discharge follow ups. Filled pill box with medications listed in discharge paperwork. Client had torsemide 20mg  in his bag and medication had been changed by hospital provider to 10mg . Cut pills in half until new prescription  could be picked up. Contacted [facility] pharmacy and they have torsemide 10mg  for pick up. Client needs to pay on his account. CM out until middle of week to assist client with payment. Filled weekly pill box providing education on each medicine. Client plans to get guaifenesin 600mg  OTC when he goes out today. Contacted PCP Gavin Pound Johnson's office for hospital follow up. Per call office will need to work him in and office is to call client with appt information. Gave client's number and shelter's number. Contacted [facility] and client has an appt 06/21/23 at 0940. Contacted [facility] as referral listed on D/C paperwork. Per call they cannot schedule an appt due to dx listed as Acute Kidney Injury. Client is aware and plans to discuss with PCP at upcoming appt so referral can be made and sent with correct dx. Client plans to use [employer] for transportation. Shatoya Roets W RN CN

## 2023-04-15 NOTE — Transitions of Care (Post Inpatient/ED Visit) (Signed)
   04/15/2023  Name: PACEN WATFORD MRN: [id number] DOB: [birth year]  Today's TOC FU Call Status: Today's TOC FU Call Status:: Unsuccessful Call (2nd Attempt) Unsuccessful Call (1st Attempt) Date: 04/11/23 Unsuccessful Call (2nd Attempt) Date: 04/15/23  Attempted to reach the patient regarding the most recent Inpatient/ED visit.  Follow Up Plan: Additional outreach attempts will be made to reach the patient to complete the Transitions of Care (Post Inpatient/ED visit) call.   Signature   Robyne Peers, RN

## 2023-04-16 ENCOUNTER — Telehealth: Payer: Self-pay | Admitting: *Deleted

## 2023-04-16 ENCOUNTER — Telehealth: Payer: Self-pay

## 2023-04-16 DIAGNOSIS — E1121 Type 2 diabetes mellitus with diabetic nephropathy: Secondary | ICD-10-CM

## 2023-04-16 LAB — GLUCOSE, POCT (MANUAL RESULT ENTRY): POC Glucose: 157 mg/dl — AB (ref 70–99)

## 2023-04-16 NOTE — Patient Outreach (Signed)
  Care Management   Note  04/16/2023 Name: Scott Greer MRN: [id number] DOB: Sep 13, 1956  [name] is enrolled in a Managed Medicaid plan: Yes. Outreach attempt today was unsuccessful.   The care management team will reach out to the patient again over the next 7 days.   Estanislado Emms RN, BSN [place]  Managed [facility] RN Care Coordinator [phone]

## 2023-04-16 NOTE — Transitions of Care (Post Inpatient/ED Visit) (Signed)
   04/16/2023  Name: Scott Greer MRN: [id number] DOB: Aug 06, 1956  Today's TOC FU Call Status: Today's TOC FU Call Status:: Unsuccessful Call (3rd Attempt) Unsuccessful Call (1st Attempt) Date: 04/11/23 Unsuccessful Call (2nd Attempt) Date: 04/15/23 Unsuccessful Call (3rd Attempt) Date: 04/16/23  Attempted to reach the patient regarding the most recent Inpatient/ED visit.  Follow Up Plan: No further outreach attempts will be made at this time. We have been unable to contact the patient.  I left a voicemail message for Waynetta Sandy, RN CN requesting a call back to schedule a hospital follow up appointment as I have not been able to reach him   Signature   Robyne Peers, RN

## 2023-04-16 NOTE — Congregational Nurse Program (Signed)
  Dept: [phone]   Congregational Nurse Program Note  Date of Encounter: 04/16/2023  Clinic visit to check blood pressure and blood glucose and complaint of cough with yellow sputum.  BP 117/87, temp 98.5, O2 Sat 93%, respirations 16, no wheezing, rales or crackling noted either lung.  States coughing more in AM with yellow sputum.  Had not had PCP visit from hospital follow-up, called office, appointment made for Wednesday at 9:30A, transportation request given to [facility] staff.   Blood glucose 157 AC lunch, has taken medicine as prescribed at hospital discharge, to get refills at pharmacy after PCP visit.  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 Yes    Student Assistance N/A    Location Patient Served  [facility]    Visit Setting with Client Organization    Patient Status Unhoused    Insurance Medicaid    Insurance/Financial Assistance Referral N/A    Medication Have Medication Insecurities    Medical Provider Yes    Screening Referrals Made N/A    Medical Referrals Made [facility]    Medical Appointment Made [facility]    Recently w/o PCP, now 1st time PCP visit completed due to CNs referral or appointment made N/A    Food Have Food Insecurities    Transportation Need transportation assistance    Housing/Utilities No permanent housing    Interpersonal Safety N/A    Interventions Advocate/Support;Navigate Healthcare System;Case Management;Educate;Reviewed Medications    Abnormal to Normal Screening Since  Last CN Visit N/A    Screenings CN Performed Blood Pressure;Blood Glucose;Pulse Ox    Sent Client to Lab for: N/A    ED Visit Averted N/A    Life-Saving Intervention Made N/A

## 2023-04-17 ENCOUNTER — Other Ambulatory Visit: Payer: Self-pay

## 2023-04-17 ENCOUNTER — Encounter: Payer: Self-pay | Admitting: *Deleted

## 2023-04-17 ENCOUNTER — Ambulatory Visit: Payer: Medicaid Other | Attending: Physician Assistant | Admitting: Physician Assistant

## 2023-04-17 ENCOUNTER — Encounter: Payer: Self-pay | Admitting: Physician Assistant

## 2023-04-17 VITALS — BP 124/67 | HR 105

## 2023-04-17 DIAGNOSIS — I5042 Chronic combined systolic (congestive) and diastolic (congestive) heart failure: Secondary | ICD-10-CM | POA: Diagnosis not present

## 2023-04-17 DIAGNOSIS — Z7985 Long-term (current) use of injectable non-insulin antidiabetic drugs: Secondary | ICD-10-CM

## 2023-04-17 DIAGNOSIS — E1121 Type 2 diabetes mellitus with diabetic nephropathy: Secondary | ICD-10-CM | POA: Diagnosis not present

## 2023-04-17 DIAGNOSIS — E1169 Type 2 diabetes mellitus with other specified complication: Secondary | ICD-10-CM

## 2023-04-17 DIAGNOSIS — R052 Subacute cough: Secondary | ICD-10-CM | POA: Diagnosis not present

## 2023-04-17 DIAGNOSIS — Z09 Encounter for follow-up examination after completed treatment for conditions other than malignant neoplasm: Secondary | ICD-10-CM

## 2023-04-17 DIAGNOSIS — Z5900 Homelessness unspecified: Secondary | ICD-10-CM

## 2023-04-17 DIAGNOSIS — Z7984 Long term (current) use of oral hypoglycemic drugs: Secondary | ICD-10-CM | POA: Diagnosis not present

## 2023-04-17 DIAGNOSIS — N183 Chronic kidney disease, stage 3 unspecified: Secondary | ICD-10-CM

## 2023-04-17 DIAGNOSIS — N521 Erectile dysfunction due to diseases classified elsewhere: Secondary | ICD-10-CM

## 2023-04-17 LAB — POCT GLYCOSYLATED HEMOGLOBIN (HGB A1C): HbA1c, POC (controlled diabetic range): 7.8 % — AB (ref 0.0–7.0)

## 2023-04-17 MED ORDER — GLIMEPIRIDE 1 MG PO TABS
2.0000 mg | ORAL_TABLET | Freq: Every day | ORAL | 1 refills | Status: DC
Start: 2023-04-17 — End: 2024-08-23
  Filled 2023-04-17: qty 180, 90d supply, fill #0

## 2023-04-17 NOTE — Congregational Nurse Program (Signed)
  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 Yes    Student Assistance N/A    Location Patient Served  [facility]    Visit Setting with Client Organization    Patient Status Unhoused    Insurance Medicaid    Insurance/Financial Assistance Referral N/A    Medication Have Medication Insecurities;Provided Medication Assistance    Medical Provider Yes    Screening Referrals Made N/A    Medical Referrals Made N/A    Medical Appointment Made N/A    Recently w/o PCP, now 1st time PCP visit completed due to CNs referral or appointment made N/A    Food N/A    Transportation N/A    Housing/Utilities No permanent housing    Interpersonal Safety N/A    Interventions Advocate/Support;Reviewed Medications    Abnormal to Normal Screening Since Last CN Visit N/A    Screenings CN Performed N/A    Sent Client to Lab for: N/A    Did client attend any of the following based off CNs referral or appointments made? N/A    ED Visit Averted N/A    Life-Saving Intervention Made N/A            Client seen in [facility] lobby. Client requested writer assist with medication changes made at recent PCP appt. Glimepiride had been increased. Assisted client in updating pill box.  Steadman Prosperi W RN CN

## 2023-04-17 NOTE — Patient Instructions (Signed)
Make sure you are drinking about 60 ounces water daily

## 2023-04-17 NOTE — Progress Notes (Signed)
Patient ID: Scott Greer, male   DOB: [birth year], 67 y.o.   MRN: [id number]     Scott Greer, is a 67 y.o. male  NWG:[id number]  [id number]:[id number]  DOB - [birth year]  Chief Complaint  Patient presents with   Hospitalization Follow-up       Subjective:   Scott Greer is a 67 y.o. male here today for a follow up visit After hospitalization 6/3-6/5.  He presented to the ED with generalized weakness and was found to be dehydrated.  He has B BKA and is staying at [facility].  He says he is feeling much better since hospitalization.  He is asking for help with ED and says cialis  Didn't work in the past.  Appetite is ok.  Compliant with meds.  Not compliant with diabetic diet.  He was having a cough but says it is much better on mucinex and his mucus is now more clear.    Brief/Interim Summary:   Patient is a 67 year old male with history of CKD stage IIIb with creatinine in the range of 1.6-2.1, chronic diastolic CHF, diabetes type 2 with peripheral neuropathy, hypertension, hyperlipidemia who presented with generalized weakness.  Reported 3 to 4 days of diminished oral intake.  He has history of bilateral BKA but is able to independently transfer from his wheelchair.  On presentation, he was mildly hypotensive which improved with IV fluid.  Lab work showed creatinine of 3, hemoglobin of 9.  CT did not show any acute intracranial findings.  Kidney function improved with IV fluid.  PT/OT recommending SNF on discharge but he declined and wants to be discharged back to his shelter.  TOC was consulted and following.  Medically stable for discharge.   Following problems were addressed during the hospitalization:   AKI on CKD stage IIIb: Baseline creatinine ranging from 1.6-2.1.  Presented with creatinine in the range of 3.  Appeared dehydrated on presentation.  Started on IV fluids.  Now kidney function is back to baseline.  Creatinine of 1.8 today    generalized weakness: Presented with 2 to 3  days of generalized weakness.  Patient is status post bilateral BKA.  Ambulates with the help of wheelchair.  Report of fall, 2 episodes over the last 2 days.  CT head, cervical CT did not show any acute fracture or dislocation.  PT/OT consulted, recommended SNF but patient declines.  We recommend outpatient follow-up with PT/OT   Suspected UTI: UA was suspicious for UTI.  No report of dysuria.  No fever or leukocytosis.  Started on ceftriaxone.  Urine culture showed multiple species, antibiotics discontinued   History of chronic diastolic CHF: Last echo has shown EF of 55 to 60%, grade 1 diastolic dysfunction.  Appeared  dehydrated on presentation and was given IV fluids.  He was on multiple medications at home including digoxin, Coreg, spironolactone, BiDil.  Patient is currently normotensive without any medications.  He has history of systolic heart failure in the past but recent echo showed normal ejection fraction.  These medications will be discontinued.  Continue torsemide 10 mg daily at home   Diabetes type 2: Takes glimepiride, empagliflozin at home.  Recent A1c of 7.3.  Takes gabapentin for diabetic neuropathy.     Hypertension: Currently normotensive.  Presented with hypotension.  Takes Coreg, spironolactone, torsemide, Entresto, BiDil at home.  These medicines discontinued.  Hypotension could have contributed to the AKI   Hyperlipidemia: On statin   Chronic normocytic anemia: Baseline hemoglobin ranges from 8-11.  No evidence of acute blood loss.  Continue iron supplementation   Cough: Lungs clear on auscultation.  On room air.  Last chest x-ray showed no acute findings.  Continue Mucinex. Patient counseled to stop smoking   Debility: Patient is status post bilateral BKA.  Currently living at a shelter   Discharge Diagnoses:  Principal Problem:   Acute renal failure superimposed on stage 3a chronic kidney disease (HCC) Active Problems:   DM2 (diabetes mellitus, type 2) (HCC)    Essential hypertension   Hyperlipidemia   AKI (acute kidney injury) (HCC)   Chronic diastolic CHF (congestive heart failure) (HCC)   Generalized weakness   Dehydration   Fall at home, initial encounter   Acute cystitis   History of anemia due to chronic kidney disease No problems updated.  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 Authorizing Provider  aspirin (ASPIRIN LOW DOSE) 81 MG chewable tablet CHEW ONE TABLET BY MOUTH ONCE DAILY Needs appointment for further refills 10/25/22  Yes Layton Naves M, PA-C  atorvastatin (LIPITOR) 10 MG tablet Take 1 tablet (10 mg total) by mouth every evening. 12/14/22  Yes [name], MD  empagliflozin (JARDIANCE) 10 MG TABS tablet Take 1 tablet (10 mg total) by mouth daily at 12 noon. 12/14/22  Yes [name], MD  ferrous sulfate (FEROSUL) 325 (65 FE) MG tablet TAKE ONE TABLET BY MOUTH EVERY MORNING WITH BREAKFAST 08/15/22  Yes [name], MD  gabapentin (NEURONTIN) 300 MG capsule Take 1 capsule (300 mg total) by mouth 2 (two) times daily. 01/17/23  Yes [name], MD  guaiFENesin (MUCINEX) 600 MG 12 hr tablet Take 1 tablet (600 mg total) by mouth 2 (two) times daily for 7 days. 04/10/23 04/17/23 Yes Adhikari, Willia Craze, MD  isosorbide-hydrALAZINE (BIDIL) 20-37.5 MG tablet Take 1 tablet by mouth 2 (two) times daily. 12/14/22  Yes [name], MD  torsemide (DEMADEX) 10 MG tablet Take 1 tablet (10 mg total) by mouth daily. 04/10/23   Yes Burnadette Pop, MD  glimepiride (AMARYL) 1 MG tablet Take 2 tablets (2 mg total) by mouth daily with breakfast. 04/17/23   [name], Marzella Schlein, PA-C    ROS: Neg HEENT Neg resp Neg cardiac Neg GI Neg GU Neg MS Neg psych Neg neuro  Objective:   Vitals:   04/17/23 0940  BP: 124/67  Pulse: (!) 105  SpO2: 97%   Exam General appearance : Awake, alert, not in any distress. Speech Clear. Not toxic looking;  appears in poor health.  In wheelchair HEENT: Atraumatic and Normocephalic Neck: Supple, no JVD. No cervical lymphadenopathy.  Chest: Good air entry bilaterally, CTAB.  No rales/rhonchi/wheezing CVS: S1 S2 regular, no murmurs.   Neurology: Awake alert, and oriented X 3, CN II-XII intact, Non focal Skin: No Rash  Data Review Lab Results  Component Value Date   HGBA1C 7.8 (A) 04/17/2023   HGBA1C 7.3 (A) 12/21/2022  HGBA1C 6.9 08/16/2022    Assessment & Plan   1. Type 2 diabetes mellitus with diabetic nephropathy, without long-term current use of insulin (HCC) Continue jardiance and increase amaryl from 1 to 2mg  daily.  Work on diabetic diet and stay hydrated - HgB A1c - glimepiride (AMARYL) 1 MG tablet; Take 2 tablets (2 mg total) by mouth daily with breakfast.  Dispense: 180 tablet; Refill: 1  2. Hospital discharge follow-up  3. Homeless Staying at [facility]  4. Chronic combined systolic and diastolic CHF (congestive heart failure) (HCC) Continue current regimen.  Sees cardiology in about 6 weeks  5. Erectile dysfunction associated with type 2 diabetes mellitus (HCC) He will need to discuss this with cardiology to see if there are safe options  6. Subacute cough Improving;  continue mucines  7. Stage 3 chronic kidney disease, unspecified whether stage 3a or 3b CKD (HCC) If not improving, will refer to nephrology - Comprehensive metabolic panel    Return in about 3 months (around 07/18/2023) for PCP/Dr Johnosn for chronic conditions.  The patient was  given clear instructions to go to ER or return to medical center if symptoms don't improve, worsen or new problems develop. The patient verbalized understanding. The patient was told to call to get lab results if they haven't heard anything in the next week.      Georgian Co, PA-C [facility] and [facility] [place], [place] [phone]   04/17/2023, 10:03 AM

## 2023-04-18 ENCOUNTER — Other Ambulatory Visit: Payer: Self-pay | Admitting: Physician Assistant

## 2023-04-18 ENCOUNTER — Other Ambulatory Visit: Payer: Medicaid Other

## 2023-04-18 ENCOUNTER — Other Ambulatory Visit: Payer: Self-pay

## 2023-04-18 ENCOUNTER — Other Ambulatory Visit (HOSPITAL_COMMUNITY): Payer: Self-pay

## 2023-04-18 DIAGNOSIS — N183 Chronic kidney disease, stage 3 unspecified: Secondary | ICD-10-CM

## 2023-04-18 LAB — COMPREHENSIVE METABOLIC PANEL
ALT: 10 IU/L (ref 0–44)
AST: 10 IU/L (ref 0–40)
Albumin/Globulin Ratio: 1.8
Albumin: 4.1 g/dL (ref 3.9–4.9)
Alkaline Phosphatase: 46 IU/L (ref 44–121)
BUN/Creatinine Ratio: 16 (ref 10–24)
BUN: 32 mg/dL — ABNORMAL HIGH (ref 8–27)
Bilirubin Total: 0.3 mg/dL (ref 0.0–1.2)
CO2: 23 mmol/L (ref 20–29)
Calcium: 9.5 mg/dL (ref 8.6–10.2)
Chloride: 104 mmol/L (ref 96–106)
Creatinine, Ser: 2.06 mg/dL — ABNORMAL HIGH (ref 0.76–1.27)
Globulin, Total: 2.3 g/dL (ref 1.5–4.5)
Glucose: 160 mg/dL — ABNORMAL HIGH (ref 70–99)
Potassium: 5 mmol/L (ref 3.5–5.2)
Sodium: 141 mmol/L (ref 134–144)
Total Protein: 6.4 g/dL (ref 6.0–8.5)
eGFR: 35 mL/min/{1.73_m2} — ABNORMAL LOW (ref >59)

## 2023-04-18 NOTE — Patient Outreach (Signed)
  Medicaid Managed Care   Unsuccessful Outreach Note  04/18/2023 Name: Scott Greer MRN: [id number] DOB: [birth year]  Referred by: [name], MD Reason for referral : High Risk Managed Medicaid (MM social work unsuccessful telephone outreach )   An unsuccessful telephone outreach was attempted today. The patient was referred to the case management team for assistance with care management and care coordination.   Follow Up Plan: A HIPAA compliant phone message was left for the patient providing contact information and requesting a return call.   Abelino Derrick, MHA [facility]  Managed [facility] Social Worker [phone]

## 2023-04-18 NOTE — Patient Instructions (Signed)
  Medicaid Managed Care   Unsuccessful Outreach Note  04/18/2023 Name: Scott Greer MRN: [id number] DOB: 05/19/1956  Referred by: Johnson, Deborah B, MD Reason for referral : High Risk Managed Medicaid (MM social work unsuccessful telephone outreach )   An unsuccessful telephone outreach was attempted today. The patient was referred to the case management team for assistance with care management and care coordination.   Follow Up Plan: A HIPAA compliant phone message was left for the patient providing contact information and requesting a return call.   Peyton Rossner, BSW, MHA [place]  Managed Medicaid Social Worker [phone]  

## 2023-04-22 ENCOUNTER — Encounter: Payer: Self-pay | Admitting: *Deleted

## 2023-04-22 NOTE — Congregational Nurse Program (Signed)
  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 Yes    Student Assistance N/A    Location Patient Served  [facility]    Visit Setting with Client Organization    Patient Status Unhoused    Insurance Medicaid    Insurance/Financial Assistance Referral N/A    Medication Have Medication Insecurities;Provided Medication Assistance    Medical Provider Yes    Screening Referrals Made N/A    Medical Referrals Made N/A    Medical Appointment Made N/A    Recently w/o PCP, now 1st time PCP visit completed due to CNs referral or appointment made N/A    Food N/A    Transportation N/A    Housing/Utilities No permanent housing    Interpersonal Safety N/A    Interventions Advocate/Support;Reviewed Medications    Abnormal to Normal Screening Since Last CN Visit N/A    Screenings CN Performed Blood Pressure    Sent Client to Lab for: N/A    Did client attend any of the following based off CNs referral or appointments made? N/A    ED Visit Averted N/A    Life-Saving Intervention Made N/A            Client came to nurse's office for medication assistance and blood pressure check. Blood pressure 136/70, pulse 81. Assisted client with filling weekly medication pill box. Offered support and encouragement. Stacie Templin W RNCN

## 2023-04-23 ENCOUNTER — Telehealth: Payer: Self-pay

## 2023-04-23 NOTE — Telephone Encounter (Signed)
Copied from CRM [id number]. Topic: Referral - Status >> Apr 23, 2023 11:13 AM Alfred Levins wrote: Reason for CRM: [id number] - AMB REFERRAL TO PHYSICAL THERAPY/.Marland KitchenMarland KitchenMarland KitchenPlease update the referral by putting a diagnosis in it so patient can be scheduled.Marland KitchenMarland KitchenHe therapy has been delayed two now.  Please call the [facility] Congregational Nurse (Ms. Sibyl Parr) when the referral has been updated she has to make transportation arrangements for patient

## 2023-04-24 ENCOUNTER — Other Ambulatory Visit: Payer: Self-pay | Admitting: Nurse Practitioner

## 2023-04-24 DIAGNOSIS — Z89511 Acquired absence of right leg below knee: Secondary | ICD-10-CM

## 2023-04-24 NOTE — Telephone Encounter (Signed)
Referral placed.

## 2023-04-26 ENCOUNTER — Encounter: Payer: Self-pay | Admitting: *Deleted

## 2023-04-26 NOTE — Telephone Encounter (Signed)
Called & spoke to [name]. Informed of new referral placed to [facility] and their information. Scott Greer expressed understanding and gratitude and will have another congregational nurse on Monday 04/29/2023 to schedule transportation for the patient. No further questions at this time.

## 2023-04-30 NOTE — Congregational Nurse Program (Signed)
  Dept: [phone]   Congregational Nurse Program Note  Date of Encounter: 04/30/2023  Clinic visit to assist with medications and check blood glucose and blood pressure. Unable to check blood glucose as he had finished lunch less than 30 minutes before coming to clinic. Had not taken AM medications due to waiting on nurse visit. BP was 148/82, pulse 69, O2 Sat 98%.  Reviewed all of medications, their reason for him taking them, gave his morning dosages and filled pillbox as each medicine was discussed.  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 Yes    Student Assistance N/A    Location Patient Served  [facility]    Visit Setting with Client Organization    Patient Status Unhoused    Insurance Medicaid;Medicare    Insurance/Financial Assistance Referral N/A    Medication Have Medication Insecurities    Medical Provider Yes    Screening Referrals Made N/A    Medical Referrals Made N/A    Medical Appointment Made N/A    Recently w/o PCP, now 1st time PCP visit completed due to CNs referral or appointment made N/A    Food Have Food Insecurities    Transportation Need transportation assistance    Housing/Utilities No permanent housing    Interpersonal Safety Do not feel safe at current residence    Interventions Advocate/Support;Case Management;Counsel;Educate;Reviewed Medications    Abnormal to Normal Screening Since Last CN Visit N/A    Screenings CN Performed Blood Pressure;Pulse Ox    Sent Client to Lab for: N/A    Did  client attend any of the following based off CNs referral or appointments made? N/A    ED Visit Averted N/A    Life-Saving Intervention Made N/A

## 2023-04-30 NOTE — Congregational Nurse Program (Signed)
Dept: [phone]   Congregational Nurse Program Note  Date of Encounter: 04/23/2023  Past Medical History: Past Medical History:  Diagnosis Date   CHF (congestive heart failure) (HCC)    Dehiscence of amputation stump (HCC)    left great toe   Diabetes mellitus without complication (HCC) 1999   Type II   Glaucoma 2015   Hyperlipidemia    Hypertension 2005   Left foot infection 02/07/2021   Osteomyelitis (HCC)    Wears glasses     Encounter Details:  CNP Questionnaire - 04/23/23 1100       Questionnaire   Ask client: Do you give verbal consent for me to treat you today? Yes    Student Assistance N/A    Location Patient Served  [facility]    Visit Setting with Client Organization    Patient Status Unhoused    Insurance Medicaid    Insurance/Financial Assistance Referral N/A    Medication Have Medication Insecurities    Medical Provider Yes    Screening Referrals Made N/A    Medical Referrals Made Non-[facility] PCP/Clinic    Medical Appointment Made N/A    Recently w/o PCP, now 1st time PCP visit completed due to CNs referral or appointment made N/A    Food Have Food Insecurities    Transportation Need transportation assistance    Housing/Utilities No permanent housing    Interpersonal Safety Do not feel safe at current residence    Interventions Advocate/Support;Navigate Healthcare System;Case Management    Abnormal to Normal Screening Since Last CN Visit N/A    Screenings CN Performed Blood Pressure;Pulse Ox    Sent Client to Lab for: N/A    Did client attend any of the following based off CNs referral or appointments made? N/A    ED Visit Averted N/A    Life-Saving Intervention Made N/A               Dept: [phone]   Congregational Nurse Program Note  Date of Encounter: 04/23/2023  Clinic visit to check blood pressure and blood glucose, BP 150/77.  Had just taken AM meds prior to coming to the nurse clinic and had just eaten so unable to check  blood glucose.  Made call to MD office to check on referral for physical therapy that had been sent in by the cardiologist office.  Determined that it had wrong diagnosis and PCP office staff will make new referral for physical therapy.   Past Medical History: Past Medical History:  Diagnosis Date   CHF (congestive heart failure) (HCC)    Dehiscence of amputation stump (HCC)    left great toe   Diabetes mellitus without complication (HCC) 1999   Type II   Glaucoma 2015   Hyperlipidemia    Hypertension 2005   Left foot infection 02/07/2021   Osteomyelitis (HCC)    Wears glasses     Encounter Details:  CNP Questionnaire - 04/23/23 1100       Questionnaire   Ask client: Do you give verbal consent for me to treat you today? Yes    Student Assistance N/A    Location Patient Served  [facility]    Visit Setting with Client Organization    Patient Status Unhoused    Insurance Medicaid    Insurance/Financial Assistance Referral N/A    Medication Have Medication Insecurities    Medical Provider Yes    Screening Referrals Made N/A    Medical Referrals Made Non-[facility] PCP/Clinic    Medical Appointment  Made N/A    Recently w/o PCP, now 1st time PCP visit completed due to CNs referral or appointment made N/A    Food Have Food Insecurities    Transportation Need transportation assistance    Housing/Utilities No permanent housing    Interpersonal Safety Do not feel safe at current residence    Interventions Advocate/Support;Navigate Healthcare System;Case Management    Abnormal to Normal Screening Since Last CN Visit N/A    Screenings CN Performed Blood Pressure;Pulse Ox    Sent Client to Lab for: N/A    Did client attend any of the following based off CNs referral or appointments made? N/A    ED Visit Averted N/A    Life-Saving Intervention Made N/A

## 2023-05-01 ENCOUNTER — Other Ambulatory Visit: Payer: Self-pay

## 2023-05-01 ENCOUNTER — Other Ambulatory Visit: Payer: Self-pay | Admitting: Physician Assistant

## 2023-05-01 ENCOUNTER — Encounter: Payer: Self-pay | Admitting: Physician Assistant

## 2023-05-01 ENCOUNTER — Encounter: Payer: Self-pay | Admitting: *Deleted

## 2023-05-01 DIAGNOSIS — M21512 Acquired clawhand, left hand: Secondary | ICD-10-CM

## 2023-05-01 NOTE — Progress Notes (Signed)
Pt seen by Dr Delford Field  Pt says has bad circulation in his L hand.   Has good distal pulses.  Says fine motor control is decreased.  Grip strength is slightly decreased on L. Is able to touch thumb w/ other 4 fingers.    He has decreased functional status of his L hand. Has some problems w/ ADLs due to this. This was documented in his hospital chart, so is not a new problem.   Sensation is intact.   He can be referred to [facility], Dr Roda Shutters, to see if additional eval is needed. May need PT/OT as well.  Marland Kitchen Has motorized wheelchair, hand calluses are improving   Theodore Demark, PA-C 05/01/2023 3:50 PM

## 2023-05-01 NOTE — Congregational Nurse Program (Signed)
Client  Dept: [phone]   Congregational Nurse Program Note  Date of Encounter: 05/01/2023  Past Medical History: Past Medical History:  Diagnosis Date   CHF (congestive heart failure) (HCC)    Dehiscence of amputation stump (HCC)    left great toe   Diabetes mellitus without complication (HCC) 1999   Type II   Glaucoma 2015   Hyperlipidemia    Hypertension 2005   Left foot infection 02/07/2021   Osteomyelitis (HCC)    Wears glasses     Encounter Details:  CNP Questionnaire - 05/01/23 1407       Questionnaire   Ask client: Do you give verbal consent for me to treat you today? Yes    Student Assistance N/A    Location Patient Served  [facility]    Visit Setting with Client Phone/Text/Email;Organization    Patient Status Unhoused    Insurance Medicaid    Insurance/Financial Assistance Referral N/A    Medication Have Medication Insecurities;Provided Medication Assistance    Medical Provider Yes    Screening Referrals Made N/A    Medical Referrals Made N/A    Medical Appointment Made N/A    Recently w/o PCP, now 1st time PCP visit completed due to CNs referral or appointment made N/A    Food N/A    Transportation Need transportation assistance    Housing/Utilities No permanent housing    Interpersonal Safety N/A    Interventions Advocate/Support;Case Management;Reviewed Medications    Abnormal to Normal Screening Since Last CN Visit N/A    Screenings CN Performed N/A    Sent Client to Lab for: N/A    Did client attend any of the following based off CNs referral or appointments made? N/A    ED Visit Averted N/A    Life-Saving Intervention Made N/A            Assisted client by picking up two medications at [facility] and completed filling pill box educating client on each medication. Client could not remember where his next eye appt was or time. Called for client and determined his next appt is with Dr. [facility] [phone] on June 28th at 12:30   [address] [facility] [address]. Adel Neyer W RN CN

## 2023-05-02 ENCOUNTER — Other Ambulatory Visit: Payer: Medicaid Other

## 2023-05-02 NOTE — Patient Instructions (Signed)
  Medicaid Managed Care   Unsuccessful Outreach Note  05/02/2023 Name: Scott Greer MRN: [id number] DOB: 02-Jul-1956  Referred by: [name], MD Reason for referral : High Risk Managed Medicaid (MM Social work unsuccessful telephone outreach )   A second unsuccessful telephone outreach was attempted today. The patient was referred to the case management team for assistance with care management and care coordination.   Follow Up Plan: A HIPAA compliant phone message was left for the patient providing contact information and requesting a return call.   Abelino Derrick, MHA [facility]  Managed [facility] Social Worker [phone]

## 2023-05-02 NOTE — Patient Outreach (Signed)
  Medicaid Managed Care   Unsuccessful Outreach Note  05/02/2023 Name: Trey L Parlow MRN: [id number] DOB: 11/20/1955  Referred by: Johnson, Deborah B, MD Reason for referral : High Risk Managed Medicaid (MM Social work unsuccessful telephone outreach )   A second unsuccessful telephone outreach was attempted today. The patient was referred to the case management team for assistance with care management and care coordination.   Follow Up Plan: A HIPAA compliant phone message was left for the patient providing contact information and requesting a return call.   Yalitza Teed, BSW, MHA [place]  Managed Medicaid Social Worker [phone]  

## 2023-05-06 ENCOUNTER — Encounter: Payer: Self-pay | Admitting: *Deleted

## 2023-05-06 LAB — BASIC METABOLIC PANEL: Glucose: 226

## 2023-05-06 NOTE — Congregational Nurse Program (Signed)
  Dept: [phone]   Congregational Nurse Program Note  Date of Encounter: 05/06/2023  Past Medical History: Past Medical History:  Diagnosis Date   CHF (congestive heart failure) (HCC)    Dehiscence of amputation stump (HCC)    left great toe   Diabetes mellitus without complication (HCC) 1999   Type II   Glaucoma 2015   Hyperlipidemia    Hypertension 2005   Left foot infection 02/07/2021   Osteomyelitis (HCC)    Wears glasses     Encounter Details:  Client came to nurse's office before lunch of a blood pressure and blood sugar check. Blood pressure 105/69, pulse 82. CBG 226. Client reports he had orange and fruit juice this morning. Educated client on food choices related to diabetes. Offered support and encouragement. Ciarah Peace W RN CN

## 2023-05-07 NOTE — Congregational Nurse Program (Signed)
  Dept: [phone]   Congregational Nurse Program Note  Date of Encounter: 05/07/2023  Clinic visit to heck blood pressure and blood glucose, BP 129/72, pulse 75 and regular, O2 Sat 96%.  Unable to check blood glucose d/t less than 30 minutes since he had eaten lunch.  Reviewed medications and discussed reason to take both medications prescribed for his diabetes.  Blood glucose on 7/1 was 226, explained complications of diabetes more likely when average blood glucose over 150.  Filled pillbox as medications were reviewed. Past Medical History: Past Medical History:  Diagnosis Date   CHF (congestive heart failure) (HCC)    Dehiscence of amputation stump (HCC)    left great toe   Diabetes mellitus without complication (HCC) 1999   Type II   Glaucoma 2015   Hyperlipidemia    Hypertension 2005   Left foot infection 02/07/2021   Osteomyelitis (HCC)    Wears glasses     Encounter Details:  CNP Questionnaire - 05/07/23 1105       Questionnaire   Ask client: Do you give verbal consent for me to treat you today? Yes    Student Assistance N/A    Location Patient Served  [facility]    Visit Setting with Client Organization    Patient Status Unhoused    Insurance Medicaid    Insurance/Financial Assistance Referral N/A    Medication N/A    Medical Provider Yes    Screening Referrals Made N/A    Medical Referrals Made N/A    Medical Appointment Made N/A    Recently w/o PCP, now 1st time PCP visit completed due to CNs referral or appointment made N/A    Food Have Food Insecurities    Transportation Need transportation assistance    Housing/Utilities No permanent housing    Interpersonal Safety Do not feel safe at current residence    Interventions Advocate/Support;Counsel;Educate;Reviewed Medications;Case Management    Abnormal to Normal Screening Since Last CN Visit N/A    Screenings CN Performed Blood Pressure;Pulse Ox    Sent Client to Lab for: N/A    Did client attend any  of the following based off CNs referral or appointments made? N/A    ED Visit Averted N/A    Life-Saving Intervention Made N/A

## 2023-05-13 ENCOUNTER — Encounter: Payer: Self-pay | Admitting: Physical Therapy

## 2023-05-13 ENCOUNTER — Ambulatory Visit: Payer: Medicaid Other | Attending: Nurse Practitioner | Admitting: Physical Therapy

## 2023-05-13 ENCOUNTER — Other Ambulatory Visit: Payer: Self-pay

## 2023-05-13 VITALS — BP 172/79 | HR 71

## 2023-05-13 DIAGNOSIS — Z89512 Acquired absence of left leg below knee: Secondary | ICD-10-CM | POA: Diagnosis present

## 2023-05-13 DIAGNOSIS — M6281 Muscle weakness (generalized): Secondary | ICD-10-CM | POA: Insufficient documentation

## 2023-05-13 DIAGNOSIS — R293 Abnormal posture: Secondary | ICD-10-CM | POA: Diagnosis present

## 2023-05-13 DIAGNOSIS — R2681 Unsteadiness on feet: Secondary | ICD-10-CM | POA: Insufficient documentation

## 2023-05-13 DIAGNOSIS — Z89511 Acquired absence of right leg below knee: Secondary | ICD-10-CM | POA: Insufficient documentation

## 2023-05-13 DIAGNOSIS — R2689 Other abnormalities of gait and mobility: Secondary | ICD-10-CM | POA: Diagnosis present

## 2023-05-13 NOTE — Progress Notes (Deleted)
Office Visit Note   Patient: Scott Greer           Date of Birth: 11/30/1955           MRN: [id number] Visit Date: 05/14/2023              Requested by: Darrol Jump, PA-C [address] [place],  [place] [address] PCP: [name], MD   Assessment & Plan: Visit Diagnoses:  1. S/P BKA (below knee amputation) bilateral (HCC)   2. Type 2 diabetes mellitus with diabetic polyneuropathy, without long-term current use of insulin (HCC)     Plan: ***  Follow-Up Instructions: No follow-ups on file.   Orders:  No orders of the defined types were placed in this encounter.  No orders of the defined types were placed in this encounter.     Procedures: No procedures performed   Clinical Data: No additional findings.   Subjective: No chief complaint on file.   HPI  Review of Systems  Constitutional: Negative.   HENT: Negative.    Eyes: Negative.   Respiratory: Negative.    Cardiovascular: Negative.   Gastrointestinal: Negative.   Endocrine: Negative.   Genitourinary: Negative.   Skin: Negative.   Allergic/Immunologic: Negative.   Neurological: Negative.   Hematological: Negative.   Psychiatric/Behavioral: Negative.    All other systems reviewed and are negative.   Objective: Vital Signs: There were no vitals taken for this visit.  Physical Exam Vitals and nursing note reviewed.  Constitutional:      Appearance: He is well-developed.  HENT:     Head: Normocephalic and atraumatic.  Eyes:     Pupils: Pupils are equal, round, and reactive to light.  Pulmonary:     Effort: Pulmonary effort is normal.  Abdominal:     Palpations: Abdomen is soft.  Musculoskeletal:        General: Normal range of motion.     Cervical back: Neck supple.  Skin:    General: Skin is warm.  Neurological:     Mental Status: He is alert and oriented to person, place, and time.  Psychiatric:        Behavior: Behavior normal.        Thought Content: Thought  content normal.        Judgment: Judgment normal.   Ortho Exam  Specialty Comments:  No specialty comments available.  Imaging: No results found.   PMFS History: Patient Active Problem List   Diagnosis Date Noted  . PDR (proliferative diabetic retinopathy) (HCC) 04/10/2023  . Generalized weakness 04/09/2023  . Dehydration 04/09/2023  . Fall at home, initial encounter 04/09/2023  . Acute cystitis 04/09/2023  . History of anemia due to chronic kidney disease 04/09/2023  . Acute renal failure superimposed on stage 3a chronic kidney disease (HCC) 04/08/2023  . Encounter for power mobility device assessment 01/03/2023  . Chronic diastolic CHF (congestive heart failure) (HCC) 12/01/2021  . S/P BKA (below knee amputation) bilateral (HCC) 07/18/2021  . Gangrene of right foot (HCC)   . Foot pain, right 04/14/2021  . Status post below-knee amputation (HCC) 02/17/2021  . Wound dehiscence   . Gangrene of toe of left foot (HCC)   . Cutaneous abscess of left foot   . Left foot infection 02/07/2021  . Leukocytosis 02/07/2021  . Thrombocytosis 02/07/2021  . Hyponatremia 02/07/2021  . AKI (acute kidney injury) (HCC) 02/07/2021  . Hyperglycemia due to diabetes mellitus (HCC) 02/07/2021  . New onset of  congestive heart failure (HCC) 12/26/2020  . CKD (chronic kidney disease) stage 3, GFR 30-59 ml/min (HCC) 12/26/2020  . Acute systolic CHF (congestive heart failure) (HCC) 12/26/2020  . Anemia, chronic disease 06/22/2020  . Amputee, great toe, right (HCC) 06/20/2020  . Normocytic anemia 06/20/2020  . Erectile dysfunction associated with type 2 diabetes mellitus (HCC) 06/20/2020  . Positive for macroalbuminuria 10/24/2019  . Amputation of left great toe (HCC) 10/23/2019  . Tobacco dependence 10/23/2019  . Diabetic foot infection (HCC)   . Noncompliance   . Glaucoma   . Hyperlipidemia   . Essential hypertension 11/06/2003  . DM2 (diabetes mellitus, type 2) (HCC) 11/05/1997   Past  Medical History:  Diagnosis Date  . CHF (congestive heart failure) (HCC)   . Dehiscence of amputation stump (HCC)    left great toe  . Diabetes mellitus without complication (HCC) 1999   Type II  . Glaucoma 2015  . Hyperlipidemia   . Hypertension 2005  . Left foot infection 02/07/2021  . Osteomyelitis (HCC)   . Wears glasses     Family History  Problem Relation Age of Onset  . Stroke Mother   . Diabetes Mother        Toward end of life    Past Surgical History:  Procedure Laterality Date  . AMPUTATION Left 06/05/2019   Procedure: LEFT GREAT TOE AMPUTATION;  Surgeon: [name], MD;  Location: [facility] OR;  Service: Orthopedics;  Laterality: Left;  . AMPUTATION Left 07/10/2019   Procedure: LEFT FOOT 1ST RAY AMPUTATION;  Surgeon: [name], MD;  Location: [facility] OR;  Service: Orthopedics;  Laterality: Left;  . AMPUTATION Right 05/25/2020   Procedure: RIGHT GREAT TOE AMPUTATION;  Surgeon: [name], MD;  Location: [facility] OR;  Service: Orthopedics;  Laterality: Right;  . AMPUTATION Left 01/25/2021   Procedure: LEFT 2ND TOE AMPUTATION;  Surgeon: [name], MD;  Location: [facility] OR;  Service: Orthopedics;  Laterality: Left;  . AMPUTATION Left 02/08/2021   Procedure: LEFT TRANSMETATARSAL AMPUTATION;  Surgeon: [name], MD;  Location: [facility] OR;  Service: Orthopedics;  Laterality: Left;  . AMPUTATION Left 02/10/2021   Procedure: AMPUTATION BELOW KNEE;  Surgeon: [name], MD;  Location: [facility] OR;  Service: Orthopedics;  Laterality: Left;  . AMPUTATION Right 04/14/2021   Procedure: RIGHT 1ST AND 2ND RAY AMPUTATION;  Surgeon: [name], MD;  Location: [facility] OR;  Service: Orthopedics;  Laterality: Right;  . AMPUTATION Right 04/26/2021   Procedure: RIGHT BELOW KNEE AMPUTATION;  Surgeon: [name], MD;  Location: [facility] OR;  Service: Orthopedics;  Laterality: Right;  . NO PAST SURGERIES    . RIGHT/LEFT HEART CATH AND CORONARY ANGIOGRAPHY N/A 01/02/2021   Procedure: RIGHT/LEFT HEART CATH AND  CORONARY ANGIOGRAPHY;  Surgeon: Runell Gess, MD;  Location: [facility];  Service: Cardiovascular;  Laterality: N/A;   Social History   Occupational History  . Not on file  Tobacco Use  . Smoking status: Every Day    Types: Cigars  . Smokeless tobacco: Former    Types: Chew  . Tobacco comments:    8 daily  Vaping Use  . Vaping Use: Never used  Substance and Sexual Activity  . Alcohol use: Yes    Alcohol/week: 11.0 standard drinks of alcohol    Types: 3 Cans of beer, 8 Shots of liquor per week    Comment: occasional  . Drug use: Yes    Types: Marijuana    Comment:  ocassional - last time 05/08/2020  . Sexual activity: Not Currently

## 2023-05-13 NOTE — Therapy (Unsigned)
OUTPATIENT PHYSICAL THERAPY PROSTHETICS EVALUATION   Patient Name: Scott Greer MRN: [id number] DOB:1956/05/31, 67 y.o., male Today's Date: 05/13/2023  PCP: Jonah Blue, MD REFERRING PROVIDER: Claiborne Rigg, NP   END OF SESSION:  PT End of Session - 05/13/23 1330     Visit Number 1    Number of Visits 9   8 + eval   Date for PT Re-Evaluation 07/19/23   pushed out due to likely patient need for reschedule   Authorization Type [employer] (27 v limit)    PT Start Time 1315    PT Stop Time 1402    PT Time Calculation (min) 47 min    Equipment Utilized During Treatment Gait belt    Behavior During Therapy Agitated              Past Medical History:  Diagnosis Date   CHF (congestive heart failure) (HCC)    Dehiscence of amputation stump (HCC)    left great toe   Diabetes mellitus without complication (HCC) 1999   Type II   Glaucoma 2015   Hyperlipidemia    Hypertension 2005   Left foot infection 02/07/2021   Osteomyelitis (HCC)    Wears glasses    Past Surgical History:  Procedure Laterality Date   AMPUTATION Left 06/05/2019   Procedure: LEFT GREAT TOE AMPUTATION;  Surgeon: [name], MD;  Location: [facility] OR;  Service: Orthopedics;  Laterality: Left;   AMPUTATION Left 07/10/2019   Procedure: LEFT FOOT 1ST RAY AMPUTATION;  Surgeon: [name], MD;  Location: [facility] OR;  Service: Orthopedics;  Laterality: Left;   AMPUTATION Right 05/25/2020   Procedure: RIGHT GREAT TOE AMPUTATION;  Surgeon: [name], MD;  Location: [facility] OR;  Service: Orthopedics;  Laterality: Right;   AMPUTATION Left 01/25/2021   Procedure: LEFT 2ND TOE AMPUTATION;  Surgeon: [name], MD;  Location: [facility] OR;  Service: Orthopedics;  Laterality: Left;   AMPUTATION Left 02/08/2021   Procedure: LEFT TRANSMETATARSAL AMPUTATION;  Surgeon: [name], MD;  Location: [facility] OR;  Service: Orthopedics;  Laterality: Left;   AMPUTATION Left 02/10/2021   Procedure: AMPUTATION BELOW KNEE;   Surgeon: [name], MD;  Location: [facility] OR;  Service: Orthopedics;  Laterality: Left;   AMPUTATION Right 04/14/2021   Procedure: RIGHT 1ST AND 2ND RAY AMPUTATION;  Surgeon: [name], MD;  Location: [facility] OR;  Service: Orthopedics;  Laterality: Right;   AMPUTATION Right 04/26/2021   Procedure: RIGHT BELOW KNEE AMPUTATION;  Surgeon: [name], MD;  Location: [facility] OR;  Service: Orthopedics;  Laterality: Right;   NO PAST SURGERIES     RIGHT/LEFT HEART CATH AND CORONARY ANGIOGRAPHY N/A 01/02/2021   Procedure: RIGHT/LEFT HEART CATH AND CORONARY ANGIOGRAPHY;  Surgeon: Runell Gess, MD;  Location: [facility] INVASIVE CV LAB;  Service: Cardiovascular;  Laterality: N/A;   Patient Active Problem List   Diagnosis Date Noted   PDR (proliferative diabetic retinopathy) (HCC) 04/10/2023   Generalized weakness 04/09/2023   Dehydration 04/09/2023   Fall at home, initial encounter 04/09/2023   Acute cystitis 04/09/2023   History of anemia due to chronic kidney disease 04/09/2023   Acute renal failure superimposed on stage 3a chronic kidney disease (HCC) 04/08/2023   Encounter for power mobility device assessment 01/03/2023   Chronic diastolic CHF (congestive heart failure) (HCC) 12/01/2021   S/P BKA (below knee amputation) bilateral (HCC) 07/18/2021   Gangrene of right foot (HCC)    Foot pain, right 04/14/2021  Status post below-knee amputation (HCC) 02/17/2021   Wound dehiscence    Gangrene of toe of left foot (HCC)    Cutaneous abscess of left foot    Left foot infection 02/07/2021   Leukocytosis 02/07/2021   Thrombocytosis 02/07/2021   Hyponatremia 02/07/2021   AKI (acute kidney injury) (HCC) 02/07/2021   Hyperglycemia due to diabetes mellitus (HCC) 02/07/2021   New onset of congestive heart failure (HCC) 12/26/2020   CKD (chronic kidney disease) stage 3, GFR 30-59 ml/min (HCC) 12/26/2020   Acute systolic CHF (congestive heart failure) (HCC) 12/26/2020   Anemia, chronic disease 06/22/2020    Amputee, great toe, right (HCC) 06/20/2020   Normocytic anemia 06/20/2020   Erectile dysfunction associated with type 2 diabetes mellitus (HCC) 06/20/2020   Positive for macroalbuminuria 10/24/2019   Amputation of left great toe (HCC) 10/23/2019   Tobacco dependence 10/23/2019   Diabetic foot infection (HCC)    Noncompliance    Glaucoma    Hyperlipidemia    Essential hypertension 11/06/2003   DM2 (diabetes mellitus, type 2) (HCC) 11/05/1997    ONSET DATE: 04/24/2023 (referral date) (He reports he got legs on 10/30/2022)  REFERRING DIAG: Z89.512,Z89.511 (ICD-10-CM) - S/P BKA (below knee amputation) bilateral (HCC)  THERAPY DIAG:  Other abnormalities of gait and mobility  Muscle weakness (generalized)  Abnormal posture  Unsteadiness on feet  Rationale for Evaluation and Treatment: Rehabilitation  SUBJECTIVE:   SUBJECTIVE STATEMENT: Patient states he has not been wearing his prosthetics or walking since his was last discharged from PT for his no show appts (3/1 was last seen in clinic).  He states he is being kicked out of [employer], but reports having no where else to go at current as SW has not found him a place at current.  He does not have RW with him today as he states it is back at shelter.  He presents in manual chair w/ prosthetics donned incorrectly, liner > socks > silicone inner socket > sock > hard socket.  PT as pt fix prosthetics w/ education on sock adjustment and prosthetic fit.  Pt is frustrated by questioning regarding wear schedule and why he has not been walking since discharge.  PT encourages patient to button and zip pants prior to onset of objective measures. 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  left BKA on 02/10/21 and R BKA on 04/26/21.  HTN, DM2  PAIN:  Are you having  pain? No  PRECAUTIONS: Fall  WEIGHT BEARING RESTRICTIONS: No  FALLS: Has patient fallen in last 6 months? Yes. Number of falls 1-fell out of wheelchair without prosthetics on when he went to catch his phone as it fell out of his lap, fell forward (pt is agitated with therapist for asking about falls as "it does not pertain to my balance") - 6/3 ED visit  LIVING ENVIRONMENT: Lives with: lives with their family and is homeless is living at [employer] in: Homeless shelter Home Access: Elevator Home layout: Two level Stairs: No Has following equipment at home: Wheelchair (manual)  PLOF: Independent with basic ADLs, Independent with community mobility with device, and Independent with homemaking with wheelchair  PATIENT GOALS: "To walk"  OBJECTIVE:   DIAGNOSTIC FINDINGS:  CT of head and cervical spine 04/08/2023: IMPRESSION: 1. No acute intracranial pathology. 2. No acute fracture or traumatic malalignment  of the cervical spine. 3. Suboccipital hematoma/contusion.  COGNITION: Overall cognitive status: No family/caregiver present to determine baseline cognitive functioning and pt is verbally agitated with therapist about evaluation process throughout; repeatedly asking why therapist is asking questions and if this process could have been done over the phone.   SENSATION: WFL  MUSCLE LENGTH: Some tightness noted in B knees in standing, unable to achieve full knee extension, lacking approx 10 deg in standing.   POSTURE: rounded shoulders, forward head, and flexed trunk   LOWER EXTREMITY ROM:  ROM Right eval Left eval  Hip flexion    Hip extension    Hip abduction    Hip adduction    Hip internal rotation    Hip external rotation    Knee flexion    Knee extension    Ankle dorsiflexion    Ankle plantarflexion    Ankle inversion    Ankle eversion     (Blank rows = not tested) Grossly WFL, did not formally measure knee extension lag.   LOWER EXTREMITY  MMT:  MMT Right eval Left eval  Hip flexion 4/5 4/5  Hip extension    Hip abduction 4/5 4/5  Hip adduction 4/5 4/5  Hip internal rotation    Hip external rotation    Knee flexion 4/5 4/5  Knee extension 4/5 4/5  Ankle dorsiflexion    Ankle plantarflexion    Ankle inversion    Ankle eversion    (Blank rows = not tested) All tested in seated position.    TRANSFERS: Sit to stand: CGA Stand to sit: CGA Stand pivot transfer: CGA    GAIT: Gait pattern: step through pattern, decreased stride length, knee flexed in stance- Right, ataxic, trunk flexed, and narrow BOS Distance walked: various clinic distances + testing Assistive device utilized: Walker - 2 wheeled Level of assistance: CGA and Min A Gait velocity: 1.55 ft/sec with RW and CGA - min A Comments: Utilized RW with band and two targets tied on so provided cues on aiming R foot for R target and L foot for left target as done before.  Pt LLE appears ataxic w/ variable placement noted during swing and without heel strike which could be attributed to loafer style shoes and lack of heel height for prosthetic.  Improved bilateral LE extension in stance, but more noted in the RLE than LLE.     FUNCTIONAL TESTS:  Timed up and go (TUG): 35.03 secs with RW and CGA-min A 10 meter walk test: 21.31 seconds w/ RW and CGA-minA = 1.55 ft/sec OR 0.47 m/sec   CURRENT PROSTHETIC WEAR ASSESSMENT: Patient is independent with: skin check and residual limb care Patient is dependent with: prosthetic cleaning, ply sock cleaning, correct ply sock adjustment, proper wear schedule/adjustment, and proper weight-bearing schedule/adjustment Donning prosthesis: SBA and Min A Doffing prosthesis: SBA and Min A (needed more assist today as fabric got caught in pin) Prosthetic wear tolerance: Reports he is wearing for about 30 mins a few times day  Prosthetic weight bearing tolerance: has not been wearing prosthetics since 3/1 Edema: none noted  Residual  limb condition: Pts skin is dry and intact bilaterally Prosthetic description: pin lock suspension with flexible inner socket.  He does 1 ply over this and then 3 ply over silicone liner on RLE and 3 ply over liner on LLE.   K code/activity level with prosthetic use: Level 3      TODAY'S TREATMENT:  DATE: no treatment initiated today   TREATMENT:  PATIENT EDUCATED ON FOLLOWING PROSTHETIC CARE: Prosthetic wear tolerance: 2 hours, 2x/day, 7 days/week Prosthetic weight bearing tolerance: 3-5 minutes Other education  Correct ply sock adjustment, Propper donning, Proper doffing, Proper wear schedule/adjustment, and Proper weight-bearing schedule/adjustment  PATIENT EDUCATION: Education details: Educated on above, evaluation results, POC, goals and PT to send request to MD for RW order.  Person educated: Patient Education method: Explanation, Demonstration, and Verbal cues Education comprehension: verbalized understanding and needs further education  HOME EXERCISE PROGRAM: None initiated today   ASSESSMENT:  CLINICAL IMPRESSION: Patient is a 67 y.o. male  who was seen today for physical therapy evaluation and treatment for B BKA for prosthetic training. 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  Upon PT evaluation, PT assessed pts skin and note that sock over flexible liner got stuck in pin so provided education on ensuring that both sets of fabric are clear from hole to ensure this doesn't happen.  Also provided education on how to better align pin by holding with B thumbs.  Note gait speed was 1.13 ft/sec with RW, indicative of recurrent fall risk, and TUG time of 43 secs with RW, indicative of high fall risk.   OBJECTIVE IMPAIRMENTS:  Abnormal gait, decreased activity tolerance, decreased balance, decreased knowledge of use of DME, decreased mobility, decreased ROM, decreased strength, impaired perceived functional ability, impaired flexibility, impaired UE functional use, postural dysfunction, and prosthetic dependency .   ACTIVITY LIMITATIONS: carrying, lifting, bending, standing, squatting, stairs, transfers, bathing, and locomotion level  PARTICIPATION LIMITATIONS: meal prep, cleaning, laundry, driving, shopping, and community activity  PERSONAL FACTORS: Past/current experiences and 3+ comorbidities: see above  are also affecting patient's functional outcome.   REHAB POTENTIAL: Good  CLINICAL DECISION MAKING: Evolving/moderate complexity  EVALUATION COMPLEXITY: Moderate   GOALS: Goals reviewed with patient? Yes  SHORT TERM GOALS: Target date: 12/15/22  Pt will be IND with initial HEP in order to indicate improved functional mobility and dec fall risk. Baseline: None given at this time Goal status: INITIAL  2.  Pt will improve gait speed to >/=1.8 ft/sec w/ RW in order to indicate dec fall risk.  Baseline: 1.13 ft/sec with RW Goal status: INITIAL  3.  Pt will ambulate 150' with RW at S level in order to indicate more independent household ambulation.   Baseline: 80' with RW at min A  Goal status: INITIAL  4.  Pt will improve TUG to </=38 secs w/ RW at S level in order to indicate dec fall risk   Baseline: 43.69 secs with RW and min A Goal status: INITIAL  5.  Pt will report wearing B prostheses 60% of awake hours, donning and doffing independently without skin issues.   Baseline: only wearing 30 mins a few times a day, needs some assist to don appropriately  Goal status: INITIAL  6.  Will assess BERG balance test and improve score by 4 points from baseline in order to indicate dec fall risk.  Baseline: did not assess at this time  Goal status: INITIAL  LONG TERM GOALS: Target date: 01/13/23  Pt  will be IND with final HEP in order to indicate improved functional mobility and dec fall risk. Baseline: none initiated at this time  Goal status: INITIAL  2.  Pt will improve gait speed to >/=2.62 ft/sec w/ RW in order to  indicate dec fall risk.  Baseline: 1.13 ft/sec with RW and min A  Goal status: INITIAL  3.  Pt will negotiate up/down 4 steps with B rail, up/down ramp and curb and ambulate x 500' outdoors over unlevel paved surfaces with RW at S level in order to indicate improved community mobility.   Baseline: Not assessed due to time constraint Goal status: INITIAL  4.  Pt will improve TUG to </=30 secs w/ LRAD at S level in order to indicate dec fall risk   Baseline: 43.69 secs with RW and min A  Goal status: INITIAL  5.  Pt will improve BERG balance score by 8 points from baseline in order to indicate dec fall risk.  Baseline: Did not assess yet  Goal status: INITIAL  6.  Will initiate gait training with quad tip cane/forearm crutch when appropriate and update goal.   Baseline: not assessed at this time  Goal status: INITIAL   PLAN:  PT FREQUENCY: 1x/week (Scheduling 1 visit at a time)  PT DURATION: 8 weeks  PLANNED INTERVENTIONS: Therapeutic exercises, Therapeutic activity, Neuromuscular re-education, Balance training, Gait training, Patient/Family education, Self Care, Stair training, Vestibular training, Prosthetic training, DME instructions, and Manual therapy  PLAN FOR NEXT SESSION: Assess BERG, initiate HEP for standing balance (sink HEP), hamstring stretching/hip flex stretch, prosthetic education (esp sock adjustment), gait with RW, obstacles, stairs/ramp/curb   Camille Bal, PT, DPT [facility] [address] [place], [place], [address] Phone: [phone]   Fax:  [phone] 05/13/23, 2:04 PM    Check all possible CPT codes: 97110- Therapeutic Exercise, [phone]- Neuro Re-education, [phone] - Gait Training,  [phone] - Manual Therapy, 97530 - Therapeutic Activities, [phone] - Self Care, and [phone] - Prosthetic training    Check all conditions that are expected to impact treatment: Diabetes mellitus   If treatment provided at initial evaluation, no treatment charged due to lack of authorization.

## 2023-05-14 ENCOUNTER — Ambulatory Visit: Payer: Medicaid Other | Admitting: Orthopaedic Surgery

## 2023-05-14 DIAGNOSIS — Z89512 Acquired absence of left leg below knee: Secondary | ICD-10-CM

## 2023-05-14 DIAGNOSIS — E1142 Type 2 diabetes mellitus with diabetic polyneuropathy: Secondary | ICD-10-CM

## 2023-05-20 ENCOUNTER — Other Ambulatory Visit: Payer: Medicaid Other

## 2023-05-20 NOTE — Patient Outreach (Signed)
  Medicaid Managed Care   Unsuccessful Outreach Note  05/20/2023 Name: Scott Greer MRN: [id number] DOB: September 18, 1956  Referred by: [name], MD Reason for referral : High Risk Managed Medicaid (MM Social work unsuccessful telephone outreach )   A second unsuccessful telephone outreach was attempted today. The patient was referred to the case management team for assistance with care management and care coordination.   Follow Up Plan: The care management team will reach out to the patient again over the next 30 days.   Abelino Derrick, MHA [facility]  Managed [facility] Social Worker [phone]

## 2023-05-20 NOTE — Patient Instructions (Signed)
  Medicaid Managed Care   Unsuccessful Outreach Note  05/20/2023 Name: Scott Greer MRN: [id number] DOB: September 18, 1956  Referred by: [name], MD Reason for referral : High Risk Managed Medicaid (MM Social work unsuccessful telephone outreach )   A second unsuccessful telephone outreach was attempted today. The patient was referred to the case management team for assistance with care management and care coordination.   Follow Up Plan: The care management team will reach out to the patient again over the next 30 days.   Abelino Derrick, MHA [facility]  Managed [facility] Social Worker [phone]

## 2023-05-27 ENCOUNTER — Ambulatory Visit: Payer: Medicaid Other | Admitting: Physical Therapy

## 2023-05-27 ENCOUNTER — Encounter: Payer: Self-pay | Admitting: Physical Therapy

## 2023-05-27 VITALS — BP 163/78 | HR 56

## 2023-05-27 DIAGNOSIS — Z89511 Acquired absence of right leg below knee: Secondary | ICD-10-CM

## 2023-05-27 DIAGNOSIS — R2689 Other abnormalities of gait and mobility: Secondary | ICD-10-CM | POA: Diagnosis not present

## 2023-05-27 DIAGNOSIS — R2681 Unsteadiness on feet: Secondary | ICD-10-CM

## 2023-05-27 DIAGNOSIS — M6281 Muscle weakness (generalized): Secondary | ICD-10-CM

## 2023-05-27 DIAGNOSIS — R293 Abnormal posture: Secondary | ICD-10-CM

## 2023-05-27 NOTE — Therapy (Signed)
OUTPATIENT PHYSICAL THERAPY PROSTHETICS TREATMENT   Patient Name: Scott Greer MRN: [id number] DOB:Aug 27, 1956, 67 y.o., male Today's Date: 05/27/2023  PCP: Jonah Blue, MD REFERRING PROVIDER: Claiborne Rigg, NP   END OF SESSION:  PT End of Session - 05/27/23 1318     Visit Number 2    Number of Visits 9    Date for PT Re-Evaluation 07/19/23    Authorization Type [employer] (27 v limit)    PT Start Time 1318    PT Stop Time 1400    PT Time Calculation (min) 42 min    Equipment Utilized During Treatment Gait belt    Activity Tolerance Patient tolerated treatment well    Behavior During Therapy WFL for tasks assessed/performed              Past Medical History:  Diagnosis Date   CHF (congestive heart failure) (HCC)    Dehiscence of amputation stump (HCC)    left great toe   Diabetes mellitus without complication (HCC) 1999   Type II   Glaucoma 2015   Hyperlipidemia    Hypertension 2005   Left foot infection 02/07/2021   Osteomyelitis (HCC)    Wears glasses    Past Surgical History:  Procedure Laterality Date   AMPUTATION Left 06/05/2019   Procedure: LEFT GREAT TOE AMPUTATION;  Surgeon: [name], MD;  Location: [facility] OR;  Service: Orthopedics;  Laterality: Left;   AMPUTATION Left 07/10/2019   Procedure: LEFT FOOT 1ST RAY AMPUTATION;  Surgeon: [name], MD;  Location: [facility] OR;  Service: Orthopedics;  Laterality: Left;   AMPUTATION Right 05/25/2020   Procedure: RIGHT GREAT TOE AMPUTATION;  Surgeon: [name], MD;  Location: [facility] OR;  Service: Orthopedics;  Laterality: Right;   AMPUTATION Left 01/25/2021   Procedure: LEFT 2ND TOE AMPUTATION;  Surgeon: [name], MD;  Location: [facility] OR;  Service: Orthopedics;  Laterality: Left;   AMPUTATION Left 02/08/2021   Procedure: LEFT TRANSMETATARSAL AMPUTATION;  Surgeon: [name], MD;  Location: [facility] OR;  Service: Orthopedics;  Laterality: Left;   AMPUTATION Left 02/10/2021   Procedure: AMPUTATION  BELOW KNEE;  Surgeon: [name], MD;  Location: [facility] OR;  Service: Orthopedics;  Laterality: Left;   AMPUTATION Right 04/14/2021   Procedure: RIGHT 1ST AND 2ND RAY AMPUTATION;  Surgeon: [name], MD;  Location: [name] OR;  Service: Orthopedics;  Laterality: Right;   AMPUTATION Right 04/26/2021   Procedure: RIGHT BELOW KNEE AMPUTATION;  Surgeon: [name], MD;  Location: [facility] OR;  Service: Orthopedics;  Laterality: Right;   NO PAST SURGERIES     RIGHT/LEFT HEART CATH AND CORONARY ANGIOGRAPHY N/A 01/02/2021   Procedure: RIGHT/LEFT HEART CATH AND CORONARY ANGIOGRAPHY;  Surgeon: Runell Gess, MD;  Location: [facility] INVASIVE CV LAB;  Service: Cardiovascular;  Laterality: N/A;   Patient Active Problem List   Diagnosis Date Noted   PDR (proliferative diabetic retinopathy) (HCC) 04/10/2023   Generalized weakness 04/09/2023   Dehydration 04/09/2023   Fall at home, initial encounter 04/09/2023   Acute cystitis 04/09/2023   History of anemia due to chronic kidney disease 04/09/2023   Acute renal failure superimposed on stage 3a chronic kidney disease (HCC) 04/08/2023   Encounter for power mobility device assessment 01/03/2023   Chronic diastolic CHF (congestive heart failure) (HCC) 12/01/2021   S/P BKA (below knee amputation) bilateral (HCC) 07/18/2021   Gangrene of right foot (HCC)    Foot pain, right 04/14/2021   Status  post below-knee amputation (HCC) 02/17/2021   Wound dehiscence    Gangrene of toe of left foot (HCC)    Cutaneous abscess of left foot    Left foot infection 02/07/2021   Leukocytosis 02/07/2021   Thrombocytosis 02/07/2021   Hyponatremia 02/07/2021   AKI (acute kidney injury) (HCC) 02/07/2021   Hyperglycemia due to diabetes mellitus (HCC) 02/07/2021   New onset of congestive heart failure (HCC) 12/26/2020   CKD (chronic kidney disease) stage 3, GFR 30-59 ml/min (HCC) 12/26/2020   Acute systolic CHF (congestive heart failure) (HCC) 12/26/2020   Anemia, chronic disease  06/22/2020   Amputee, great toe, right (HCC) 06/20/2020   Normocytic anemia 06/20/2020   Erectile dysfunction associated with type 2 diabetes mellitus (HCC) 06/20/2020   Positive for macroalbuminuria 10/24/2019   Amputation of left great toe (HCC) 10/23/2019   Tobacco dependence 10/23/2019   Diabetic foot infection (HCC)    Noncompliance    Glaucoma    Hyperlipidemia    Essential hypertension 11/06/2003   DM2 (diabetes mellitus, type 2) (HCC) 11/05/1997    ONSET DATE: 04/24/2023 (referral date) (He reports he got legs on 10/30/2022)  REFERRING DIAG: Z89.512,Z89.511 (ICD-10-CM) - S/P BKA (below knee amputation) bilateral (HCC)  THERAPY DIAG:  Other abnormalities of gait and mobility  Muscle weakness (generalized)  Unsteadiness on feet  Abnormal posture  S/P bilateral BKA (below knee amputation) (HCC)  Rationale for Evaluation and Treatment: Rehabilitation  SUBJECTIVE:   SUBJECTIVE STATEMENT: Patient denies any falls/near falls. Patient arrives to session in power chair without 2WW. Patient states he has now moved to [address]. Patient reports that he has not been wearing prosthetics or practice much standing at all.   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 No  PRECAUTIONS: Fall  WEIGHT BEARING RESTRICTIONS: No  FALLS: Has patient fallen in last 6 months? Yes. Number of falls 1-fell out of wheelchair without prosthetics on when he went to catch his phone as it fell out of his lap, fell forward (pt is agitated with therapist for asking about falls as "it does not pertain to my balance") - 6/3 ED visit  LIVING ENVIRONMENT: Lives with: lives with their family and is homeless is living at  [employer] in: Homeless shelter Home Access: Elevator Home layout: Two level Stairs: No Has following equipment at home: Wheelchair (manual)  PLOF: Independent with basic ADLs, Independent with community mobility with device, and Independent with homemaking with wheelchair  PATIENT GOALS: "To walk"  OBJECTIVE:   DIAGNOSTIC FINDINGS:  CT of head and cervical spine 04/08/2023: IMPRESSION: 1. No acute intracranial pathology. 2. No acute fracture or traumatic malalignment of the cervical spine. 3. Suboccipital hematoma/contusion.  COGNITION: Overall cognitive status: No family/caregiver present to determine baseline cognitive functioning and pt is verbally agitated with therapist about evaluation process throughout; repeatedly asking why therapist is asking questions and if this process could have been done over the phone.   TODAY'S TREATMENT:  Vitals:   05/27/23 1322  BP: (!) 163/78  Pulse: (!) 56   Self Care:  Discussed extensively with patient the importance of BP management and taking medications as prescribed. Patient reports he feels better of his medication but therapist reinforced the importance of managing health/following up with PCP about medication side effects. Patient resistant to therapist education and states he will do what he wants. Also discussed extensively the importance of wearing prosthetics and practice standing at home with 2WW. Patient again resistant but open to sink HEP which therapist reprints for him to begin working on at home.  TherAct:   [facility] PT Assessment - 05/27/23 0001       Standardized Balance Assessment   Standardized Balance Assessment Berg Balance Test      Berg Balance Test   Sit to Stand Able to stand  independently using hands    Standing Unsupported Able to stand 30 seconds unsupported   1  minute and 47 seconds (cannot tolerate longer due to fatigue)   Sitting with Back Unsupported but Feet Supported on Floor or Stool Able to sit safely and securely 2 minutes    Stand to Sit Uses backs of legs against chair to control descent    Transfers Needs one person to assist   has to use external support to stablilize   Standing Unsupported with Eyes Closed Able to stand 10 seconds with supervision    Standing Unsupported with Feet Together Able to place feet together independently and stand for 1 minute with supervision    From Standing, Reach Forward with Outstretched Arm Reaches forward but needs supervision   needs supervison   From Standing Position, Pick up Object from Floor Unable to pick up and needs supervision   needs supervision for safety but can pickup hand sanitizer   From Standing Position, Turn to Look Behind Over each Shoulder Turn sideways only but maintains balance    Turn 360 Degrees Needs assistance while turning   requires 2WW   Standing Unsupported, Alternately Place Feet on Step/Stool Needs assistance to keep from falling or unable to try    Standing Unsupported, One Foot in Front Loses balance while stepping or standing   requires external support to steady   Standing on One Leg Unable to try or needs assist to prevent fall    Total Score 22    Berg comment: = 22/56 (Falls Risk)            PATIENT EDUCATION: Education details: See above + Sink HEP Person educated: Patient Education method: Programmer, multimedia, Demonstration, and Verbal cues Education comprehension: verbalized understanding and needs further education  HOME EXERCISE PROGRAM: Do each exercise 1-2  times per day Do each exercise 10 repetitions Hold each exercise for 2 seconds to feel your location  AT SINK FIND YOUR MIDLINE POSITION AND PLACE FEET EQUAL DISTANCE FROM THE MIDLINE.  Try to find this position when standing still for activities.   USE TAPE ON FLOOR TO MARK THE MIDLINE POSITION. You  also should try to feel with your limb pressure in socket.  You are trying to feel with limb what you used to feel with the bottom of your foot.  Side to Side Shift: Moving your hips only (not shoulders): move weight onto your left leg, HOLD/FEEL.  Move back to equal weight on each leg, HOLD/FEEL. Move weight onto your right leg, HOLD/FEEL. Move back to equal weight on each leg, HOLD/FEEL. Repeat.  Start with both hands on sink, progress  to right hand only, then no hands.  Front to Back Shift: Moving your hips only (not shoulders): move your weight forward onto your toes, HOLD/FEEL. Move your weight back to equal Flat Foot on both legs, HOLD/FEEL. Move your weight back onto your heels, HOLD/FEEL. Move your weight back to equal on both legs, HOLD/FEEL. Repeat.   tart with both hands on sink, progress to right hand only, then no hands. Moving Cones / Cups: With equal weight on each leg: Hold on with one hand the first time, then progress to no hand supports. Move cups from one side of sink to the other. Place cups ~2" out of your reach, progress to 10" beyond reach.  Place one hand in middle of sink and reach with other hand. Do both arms.  Then hover one hand and move cups with other hand.  Overhead/Upward Reaching: alternated reaching up to top cabinets or ceiling if no cabinets present. Keep equal weight on each leg. Start with one hand support on counter while other hand reaches and progress to no hand support with reaching.  ace one hand in middle of sink and reach with other hand. Do both arms.  Then hover one hand and move cups with other hand.  5.   Looking Over Shoulders: With equal weight on each leg: alternate turning to look over your shoulders with one hand support on counter as needed.  Start with head motions only to look in front of shoulder, then even with shoulder and progress to looking behind you. To look to side, move head /eyes, then shoulder on side looking pulls back, shift more weight to  side looking and pull hip back. ace one hand in middle of sink and let go with other hand so your shoulder can pull back. Switch hands to look other way.   Then hover one hand and move cups with other hand.  6.  Stepping with leg that is not amputated:  Move items under cabinet out of your way. Shift your hips/pelvis so weight on prosthesis. SLOWLY step other leg so front of foot is in cabinet. Then step back to floor.      ASSESSMENT:  CLINICAL IMPRESSION: Session emphasized assessment of balance with Berg in addition to extensive self care education regarding prosthetic wear time, HEP, and medical management. Patient is at an increased risk for falls on Berg. Patient required multiple seated breaks during session due to limited tolerance for prolonged standing activities due to not wearing his prosthetic much outside of PT. Continue POC as able.   OBJECTIVE IMPAIRMENTS: Abnormal gait, decreased activity tolerance, decreased balance, decreased knowledge of use of DME, decreased mobility, decreased ROM, decreased strength, impaired perceived functional ability, impaired flexibility, postural dysfunction, and prosthetic dependency .   ACTIVITY LIMITATIONS: carrying, lifting, bending, standing, squatting, stairs, transfers, bathing, toileting, hygiene/grooming, and locomotion level  PARTICIPATION LIMITATIONS: meal prep, cleaning, laundry, driving, shopping, and community activity  PERSONAL FACTORS: Past/current experiences, Time since onset of injury/illness/exacerbation, Transportation, 3+ comorbidities: HTN, DM2, CKD-stage 3, cardiac history, glaucoma, and history of medical non-compliance  are also affecting patient's functional outcome.   REHAB POTENTIAL: Good  CLINICAL DECISION MAKING: Evolving/moderate complexity  EVALUATION COMPLEXITY: Moderate   GOALS: Goals reviewed with patient? Yes  SHORT TERM GOALS: Target date: 06/14/2023  Pt will be independent and compliant with strengthening  and balance focused HEP in order to maintain functional progress and improve mobility. Baseline: To be reviewed and modified. Goal status: INITIAL  2.  Pt will  demonstrate a gait speed of >/=0.67 m/sec in order to decrease risk for falls. Baseline: 0.47 m/sec w/ RW and CGA-MinA Goal status: INITIAL  3.  Pt will ambulate >/=200' with RW at no more than SBA level in order to improve navigation of current home environment and improve access to bathroom areas.  Baseline: various clinic distances  Goal status: INITIAL  4.  Pt will demonstrate TUG of </=30 seconds in order to decrease risk of falls and improve functional mobility using LRAD.  Baseline: 35.03 secs with RW and CGA-min A Goal status: INITIAL  5.  Patient will don prosthetics correctly and independently and report wearing prosthetics for >/=50% of all awake hours without skin breakdown to improve tolerance to and management of prosthetics.   Baseline: cues to minA to don/doff, currently not wearing, skin intact Goal status: INITIAL  6.  BERG to be assessed w/ STG set as appropriate. Baseline: To be assessed.  Goal status: INITIAL  LONG TERM GOALS: Target date: 07/12/2023  Patient will be compliant to formal walking program >/= 3 days per week to improve aerobic tolerance and ambulatory mechanics. Baseline: pt not ambulating regularly on eval  Goal status: INITIAL  2.  Pt will demonstrate a gait speed of >/=0.87 m/sec in order to decrease risk for falls. Baseline: 0.47 m/sec w/ RW and CGA-MinA  Goal status: INITIAL  3.  Patient will manage x4 stairs using handrails as needed at no more than SBA level to improve access to home and community environments. Baseline: Not assessed due to time constraint Goal status: INITIAL  4.  Pt will demonstrate TUG of </=25 seconds in order to decrease risk of falls and improve functional mobility using LRAD. Baseline: 35.03 secs with RW and CGA-min A Goal status: INITIAL  5.  BERG to be  assessed with LTG set as appropriate.  Baseline: To be assessed. Goal status: INITIAL  6.  Pt will ambulate >/=500' over level and unlevel distances with RW at modI level in order to improve navigation of current home environment and community access.   Baseline: Pt not ambulating at eval. Goal status: INITIAL  PLAN:  PT FREQUENCY: 1x/week (Scheduling 1 visit at a time due to prior no shows/cancellations)  PT DURATION: 8 weeks  PLANNED INTERVENTIONS: Therapeutic exercises, Therapeutic activity, Neuromuscular re-education, Balance training, Gait training, Patient/Family education, Self Care, Joint mobilization, Stair training, Vestibular training, Prosthetic training, DME instructions, Manual therapy, and Re-evaluation  PLAN FOR NEXT SESSION: ASSESS BERG-set goals, How is skin integrity if he increased wear schedule? review/modify sink HEP, hamstring stretching/hip flex stretch, prosthetic education (esp sock adjustment), gait with RW, obstacles, stairs/ramp/curb/sidewalk ambulation  Camille Bal, PT, DPT [facility] [address] [place], [place], [address] Phone: [phone]   Fax:  [phone] 05/27/23, 5:11 PM  Check all possible CPT codes: [phone] - PT Re-evaluation, 97110- Therapeutic Exercise, [phone]- Neuro Re-education, [phone] - Gait Training, [phone] - Manual Therapy, [phone] - Therapeutic Activities, [phone] - Self Care, and [phone] - Prosthetic training   Check all conditions that are expected to impact treatment: Diabetes mellitus, Musculoskeletal disorders, and Social determinants of health   If treatment provided at initial evaluation, no treatment charged due to lack of authorization.

## 2023-06-17 ENCOUNTER — Ambulatory Visit: Payer: Medicaid Other | Admitting: Physical Therapy

## 2023-06-20 ENCOUNTER — Telehealth: Payer: Self-pay | Admitting: *Deleted

## 2023-06-20 NOTE — Patient Outreach (Signed)
  Care Management   Note  06/20/2023 Name: Scott Greer MRN: [id number] DOB: 05-18-1956  [name] is enrolled in a Managed Medicaid plan: Yes. Outreach attempt today was unsuccessful.   We have been unable to make contact with the patient for follow up. The care management team is available to follow up with the patient after provider conversation with the patient regarding recommendation for care management engagement and subsequent re-referral to the care management team.   Estanislado Emms RN, BSN [place]  Managed [facility] RN Care Coordinator [phone]

## 2023-06-21 ENCOUNTER — Encounter (HOSPITAL_COMMUNITY): Payer: Medicaid Other | Admitting: Cardiology

## 2023-07-03 ENCOUNTER — Encounter: Payer: Self-pay | Admitting: Physical Therapy

## 2023-07-03 ENCOUNTER — Encounter: Payer: Self-pay | Admitting: Cardiovascular Disease

## 2023-07-03 ENCOUNTER — Ambulatory Visit: Payer: Medicaid Other | Attending: Nurse Practitioner | Admitting: Physical Therapy

## 2023-07-03 DIAGNOSIS — M6281 Muscle weakness (generalized): Secondary | ICD-10-CM | POA: Insufficient documentation

## 2023-07-03 DIAGNOSIS — R2689 Other abnormalities of gait and mobility: Secondary | ICD-10-CM | POA: Diagnosis not present

## 2023-07-03 DIAGNOSIS — R2681 Unsteadiness on feet: Secondary | ICD-10-CM | POA: Insufficient documentation

## 2023-07-03 DIAGNOSIS — R293 Abnormal posture: Secondary | ICD-10-CM | POA: Diagnosis present

## 2023-07-03 NOTE — Therapy (Addendum)
OUTPATIENT PHYSICAL THERAPY PROSTHETICS TREATMENT   Patient Name: Scott Greer MRN: [id number] DOB:[birth year], 67 y.o., male Today's Date: 07/03/2023  PCP: Jonah Blue, MD REFERRING PROVIDER: Claiborne Rigg, NP   END OF SESSION:  PT End of Session - 07/03/23 1452     Visit Number 3    Number of Visits 9    Date for PT Re-Evaluation 07/19/23    Authorization Type [employer] (27 v limit)    PT Start Time 1448    PT Stop Time 1527    PT Time Calculation (min) 39 min    Equipment Utilized During Treatment Gait belt    Activity Tolerance Patient tolerated treatment well    Behavior During Therapy WFL for tasks assessed/performed;Agitated              Past Medical History:  Diagnosis Date   CHF (congestive heart failure) (HCC)    Dehiscence of amputation stump (HCC)    left great toe   Diabetes mellitus without complication (HCC) 1999   Type II   Glaucoma 2015   Hyperlipidemia    Hypertension 2005   Left foot infection 02/07/2021   Osteomyelitis (HCC)    Wears glasses    Past Surgical History:  Procedure Laterality Date   AMPUTATION Left 06/05/2019   Procedure: LEFT GREAT TOE AMPUTATION;  Surgeon: [name], MD;  Location: [facility] OR;  Service: Orthopedics;  Laterality: Left;   AMPUTATION Left 07/10/2019   Procedure: LEFT FOOT 1ST RAY AMPUTATION;  Surgeon: [name], MD;  Location: [facility] OR;  Service: Orthopedics;  Laterality: Left;   AMPUTATION Right 05/25/2020   Procedure: RIGHT GREAT TOE AMPUTATION;  Surgeon: [name], MD;  Location: [facility] OR;  Service: Orthopedics;  Laterality: Right;   AMPUTATION Left 01/25/2021   Procedure: LEFT 2ND TOE AMPUTATION;  Surgeon: [name], MD;  Location: [facility] OR;  Service: Orthopedics;  Laterality: Left;   AMPUTATION Left 02/08/2021   Procedure: LEFT TRANSMETATARSAL AMPUTATION;  Surgeon: [name], MD;  Location: [facility] OR;  Service: Orthopedics;  Laterality: Left;   AMPUTATION Left 02/10/2021   Procedure:  AMPUTATION BELOW KNEE;  Surgeon: [name], MD;  Location: [facility] OR;  Service: Orthopedics;  Laterality: Left;   AMPUTATION Right 04/14/2021   Procedure: RIGHT 1ST AND 2ND RAY AMPUTATION;  Surgeon: [name], MD;  Location: [facility] OR;  Service: Orthopedics;  Laterality: Right;   AMPUTATION Right 04/26/2021   Procedure: RIGHT BELOW KNEE AMPUTATION;  Surgeon: [name], MD;  Location: [facility] OR;  Service: Orthopedics;  Laterality: Right;   NO PAST SURGERIES     RIGHT/LEFT HEART CATH AND CORONARY ANGIOGRAPHY N/A 01/02/2021   Procedure: RIGHT/LEFT HEART CATH AND CORONARY ANGIOGRAPHY;  Surgeon: Runell Gess, MD;  Location: [facility] INVASIVE CV LAB;  Service: Cardiovascular;  Laterality: N/A;   Patient Active Problem List   Diagnosis Date Noted   PDR (proliferative diabetic retinopathy) (HCC) 04/10/2023   Generalized weakness 04/09/2023   Dehydration 04/09/2023   Fall at home, initial encounter 04/09/2023   Acute cystitis 04/09/2023   History of anemia due to chronic kidney disease 04/09/2023   Acute renal failure superimposed on stage 3a chronic kidney disease (HCC) 04/08/2023   Encounter for power mobility device assessment 01/03/2023   Chronic diastolic CHF (congestive heart failure) (HCC) 12/01/2021   S/P BKA (below knee amputation) bilateral (HCC) 07/18/2021   Gangrene of right foot (HCC)    Foot pain, right 04/14/2021   Status  post below-knee amputation (HCC) 02/17/2021   Wound dehiscence    Gangrene of toe of left foot (HCC)    Cutaneous abscess of left foot    Left foot infection 02/07/2021   Leukocytosis 02/07/2021   Thrombocytosis 02/07/2021   Hyponatremia 02/07/2021   AKI (acute kidney injury) (HCC) 02/07/2021   Hyperglycemia due to diabetes mellitus (HCC) 02/07/2021   New onset of congestive heart failure (HCC) 12/26/2020   CKD (chronic kidney disease) stage 3, GFR 30-59 ml/min (HCC) 12/26/2020   Acute systolic CHF (congestive heart failure) (HCC) 12/26/2020   Anemia,  chronic disease 06/22/2020   Amputee, great toe, right (HCC) 06/20/2020   Normocytic anemia 06/20/2020   Erectile dysfunction associated with type 2 diabetes mellitus (HCC) 06/20/2020   Positive for macroalbuminuria 10/24/2019   Amputation of left great toe (HCC) 10/23/2019   Tobacco dependence 10/23/2019   Diabetic foot infection (HCC)    Noncompliance    Glaucoma    Hyperlipidemia    Essential hypertension 11/06/2003   DM2 (diabetes mellitus, type 2) (HCC) 11/05/1997    ONSET DATE: 04/24/2023 (referral date) (He reports he got legs on 10/30/2022)  REFERRING DIAG: Z89.512,Z89.511 (ICD-10-CM) - S/P BKA (below knee amputation) bilateral (HCC)  THERAPY DIAG:  Other abnormalities of gait and mobility  Muscle weakness (generalized)  Unsteadiness on feet  Abnormal posture  Rationale for Evaluation and Treatment: Rehabilitation  SUBJECTIVE:   SUBJECTIVE STATEMENT: Patient denies any falls/near falls. Patient arrives to session in power chair without 2WW. Patient re-iterates he has not moved to a boarding house on [address]. In the 2 months he has been there he has not been standing or walking partially due to rotted floors.   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 No  PRECAUTIONS: Fall  WEIGHT BEARING RESTRICTIONS: No  FALLS: Has patient fallen in last 6 months? Yes. Number of falls 1-fell out of wheelchair without prosthetics on when he went to catch his phone as it fell out of his lap, fell forward (pt is agitated with therapist for asking about falls as "it does not pertain to my balance") - 6/3 ED visit  LIVING ENVIRONMENT: Lives with: lives with their family and is homeless is living at  [employer] in: Homeless shelter Home Access: Elevator Home layout: Two level Stairs: No Has following equipment at home: Wheelchair (manual)  PLOF: Independent with basic ADLs, Independent with community mobility with device, and Independent with homemaking with wheelchair  PATIENT GOALS: "To walk"  OBJECTIVE:   DIAGNOSTIC FINDINGS:  CT of head and cervical spine 04/08/2023: IMPRESSION: 1. No acute intracranial pathology. 2. No acute fracture or traumatic malalignment of the cervical spine. 3. Suboccipital hematoma/contusion.  COGNITION: Overall cognitive status: No family/caregiver present to determine baseline cognitive functioning and pt is verbally agitated with therapist about evaluation process throughout; repeatedly asking why therapist is asking questions and if this process could have been done over the phone.   TODAY'S TREATMENT:  There were no vitals filed for this visit.  Self Care:  Discussed patient housing situation and his safety concerns due to bugs and reporting being bit on bilateral LE at night.  Had pt doff prosthetics to assess skin integrity without noted open areas or wounds.  Encouraged pt to follow-up with SW regarding housing concerns as he states they found him this place and he cannot go back to [facility] due to recent Covid cases.  He needs increased time to redon prosthetics.  Pt presents with towel in his lap, when asked about this pt states he is only sometimes using the toilet in his boarding house as it is all the way down the hall and he is not using the RW to get there.  He relies on urinal in his room.  He is using the towel as a pouch to carry his belongings.  He reports he is no longer taking the medicines he was taking at [facility], but cannot specify which medicines he has stopped.  Patient reports  he is rarely wearing his prosthetics as he "don't go anywhere".  PT emphasized point of wearing prosthetics and that insurance will not pay for therapy is patient is not using prosthetics.  Pt becomes agitated with therapist raising voice stating PT "just don't know what I'm going through.  I do what I can!  My legs are sore!"  PT explains that his tolerance to prosthetics are diminished due to lack of wearing and weight bearing.    Explained to pt in order for PT to justify continuing PT: -Increase wear time from 1 hour to 7 hours (by 1 hour per day) by next visit -Stand and do sink HEP 4x between now and next visit -Use walker to walk to and from bathroom at least once b/w now and next visit Did inform patient if these criteria are not met we will plan to discharge.  -Pt ambulates w/ RW ModI to SBA for foot scuff x2 w/ awareness and self-correction of clearance over 200 ft which should make pt appropriate to trial ambulation down hallway in boarding house.  PATIENT EDUCATION: Education details: Told patient to reach out to [employer] about vivewear socks due to concern for bug bites on residual limbs and wound development.  Discussed sock ply adjustment and carrying options with him due to limb volume change with standing/walking and helping to promote prosthetic fit and comfort.  Discussed scheduling next visit.  Other education as above. Person educated: Patient Education method: Explanation, Demonstration, and Verbal cues Education comprehension: verbalized understanding and needs further education  HOME EXERCISE PROGRAM: Do each exercise 1-2  times per day Do each exercise 10 repetitions Hold each exercise for 2 seconds to feel your location  AT SINK FIND YOUR MIDLINE POSITION AND PLACE FEET EQUAL DISTANCE FROM THE MIDLINE.  Try to find this position when standing still for activities.   USE TAPE ON FLOOR TO MARK THE MIDLINE POSITION. You also should try to feel with your limb pressure in  socket.  You are trying to feel with limb what you used to feel with the bottom of your foot.  Side to Side Shift: Moving your hips only (not shoulders): move weight onto your left leg, HOLD/FEEL.  Move back to equal weight on each leg, HOLD/FEEL. Move weight onto your right leg, HOLD/FEEL. Move back to equal weight on each leg, HOLD/FEEL. Repeat.  Start with both hands on sink, progress to right hand only, then no hands.  Front to Back Shift: Moving your hips only (not shoulders): move your weight forward onto your toes, HOLD/FEEL. Move your weight back to equal Flat Foot on both legs, HOLD/FEEL. Move your weight back onto your heels, HOLD/FEEL. Move your weight back to equal on both legs, HOLD/FEEL. Repeat.   tart with both hands on sink, progress to right hand only, then no hands. Moving Cones / Cups: With equal weight on each leg: Hold on with one hand the first time, then progress to no hand supports. Move cups from one side of sink to the other. Place cups ~2" out of your reach, progress to 10" beyond reach.  Place one hand in middle of sink and reach with other hand. Do both arms.  Then hover one hand and move cups with other hand.  Overhead/Upward Reaching: alternated reaching up to top cabinets or ceiling if no cabinets present. Keep equal weight on each leg. Start with one hand support on counter while other hand reaches and progress to no hand support with reaching.  ace one hand in middle of sink and reach with other hand. Do both arms.  Then hover one hand and move cups with other hand.  5.   Looking Over Shoulders: With equal weight on each leg: alternate turning to look over your shoulders with one hand support on counter as needed.  Start with head motions only to look in front of shoulder, then even with shoulder and progress to looking behind you. To look to side, move head /eyes, then shoulder on side looking pulls back, shift more weight to side looking and pull hip back. ace one hand in  middle of sink and let go with other hand so your shoulder can pull back. Switch hands to look other way.   Then hover one hand and move cups with other hand.  6.  Stepping with leg that is not amputated:  Move items under cabinet out of your way. Shift your hips/pelvis so weight on prosthesis. SLOWLY step other leg so front of foot is in cabinet. Then step back to floor.      ASSESSMENT:  CLINICAL IMPRESSION: Majority of session spent providing education and reestablishing wear schedule as pt remains self-limiting.  Did set clear boundaries with pt today regarding his motivation and efforts at home as he has lacked basic prosthetic wear and standing as he is able to do, but chooses not to in home environment.  He does have concerns about new home environment which he also expressed about prior home setting and SW is actively involved.  If pt continues on this path he will not benefit from OPPT services and PT will discharge.  He was able to safely complete 200 ft w/ RW this visit which he was encouraged to further practice at home.  Had pt schedule next visit at end of session.  OBJECTIVE IMPAIRMENTS: Abnormal gait, decreased activity tolerance, decreased balance, decreased knowledge of use of DME, decreased mobility, decreased ROM, decreased strength, impaired perceived functional ability, impaired flexibility, postural dysfunction, and prosthetic dependency .   ACTIVITY LIMITATIONS: carrying, lifting, bending, standing, squatting, stairs, transfers, bathing, toileting, hygiene/grooming, and locomotion level  PARTICIPATION LIMITATIONS: meal prep, cleaning, laundry, driving, shopping, and community activity  PERSONAL FACTORS: Past/current experiences, Time since onset of injury/illness/exacerbation, Transportation, 3+ comorbidities: HTN, DM2, CKD-stage 3, cardiac history, glaucoma, and history of medical non-compliance  are also affecting patient's functional outcome.   REHAB POTENTIAL:  Good  CLINICAL DECISION MAKING: Evolving/moderate complexity  EVALUATION  COMPLEXITY: Moderate   GOALS: Goals reviewed with patient? Yes  SHORT TERM GOALS: Target date: 06/14/2023  Pt will be independent and compliant with strengthening and balance focused HEP in order to maintain functional progress and improve mobility. Baseline: To be reviewed and modified. Goal status: INITIAL  2.  Pt will demonstrate a gait speed of >/=0.67 m/sec in order to decrease risk for falls. Baseline: 0.47 m/sec w/ RW and CGA-MinA Goal status: INITIAL  3.  Pt will ambulate >/=200' with RW at no more than SBA level in order to improve navigation of current home environment and improve access to bathroom areas.  Baseline: various clinic distances  Goal status: INITIAL  4.  Pt will demonstrate TUG of </=30 seconds in order to decrease risk of falls and improve functional mobility using LRAD.  Baseline: 35.03 secs with RW and CGA-min A Goal status: INITIAL  5.  Patient will don prosthetics correctly and independently and report wearing prosthetics for >/=50% of all awake hours without skin breakdown to improve tolerance to and management of prosthetics.   Baseline: cues to minA to don/doff, currently not wearing, skin intact Goal status: INITIAL  6.  BERG to be assessed w/ STG set as appropriate. Baseline: N/A due to date assessed. Goal status: INITIAL  LONG TERM GOALS: Target date: 07/12/2023  Patient will be compliant to formal walking program >/= 3 days per week to improve aerobic tolerance and ambulatory mechanics. Baseline: pt not ambulating regularly on eval  Goal status: INITIAL  2.  Pt will demonstrate a gait speed of >/=0.87 m/sec in order to decrease risk for falls. Baseline: 0.47 m/sec w/ RW and CGA-MinA  Goal status: INITIAL  3.  Patient will manage x4 stairs using handrails as needed at no more than SBA level to improve access to home and community environments. Baseline: Not assessed  due to time constraint Goal status: INITIAL  4.  Pt will demonstrate TUG of </=25 seconds in order to decrease risk of falls and improve functional mobility using LRAD. Baseline: 35.03 secs with RW and CGA-min A Goal status: INITIAL  5.  Pt will increase BERG balance score to >/=26/56 to demonstrate improved static balance. Baseline: 22/56 Goal status: INITIAL  6.  Pt will ambulate >/=500' over level and unlevel distances with RW at modI level in order to improve navigation of current home environment and community access.   Baseline: Pt not ambulating at eval. Goal status: INITIAL  PLAN:  PT FREQUENCY: 1x/week (Scheduling 1 visit at a time due to prior no shows/cancellations)  PT DURATION: 8 weeks  PLANNED INTERVENTIONS: Therapeutic exercises, Therapeutic activity, Neuromuscular re-education, Balance training, Gait training, Patient/Family education, Self Care, Joint mobilization, Stair training, Vestibular training, Prosthetic training, DME instructions, Manual therapy, and Re-evaluation  PLAN FOR NEXT SESSION: Did pt accomplish 3 tasks required of him?  If not plan to discharge.  How is skin integrity if he increased wear schedule? review/modify sink HEP, hamstring stretching/hip flex stretch, prosthetic education (esp sock adjustment), gait with RW, obstacles, stairs/ramp/curb/sidewalk ambulation  Camille Bal, PT, DPT [facility] [address] [place], [place], [address] Phone: [phone]   Fax:  [phone] 07/03/23, 3:27 PM  Check all possible CPT codes: [phone] - PT Re-evaluation, [id number]- Therapeutic Exercise, [phone]- Neuro Re-education, [phone] - Gait Training, [phone] - Manual Therapy, [phone] - Therapeutic Activities, [phone] - Self Care, and [phone] - Prosthetic training   Check all conditions that are expected to impact treatment: Diabetes mellitus, Musculoskeletal disorders,  and Social determinants of health   If treatment provided  at initial evaluation, no treatment charged due to lack of authorization.

## 2023-07-03 NOTE — Progress Notes (Unsigned)
Cardiology Office Note:    Date:  07/03/2023   ID:  JAMESDEAN Greer, DOB [birth year], MRN [id number]  PCP:  [name], MD   [facility] Providers Cardiologist:  Kristeen Miss, MD    . Shirlee Latch   Referring MD: [name], MD   Chief Complaint  Patient presents with   Congestive Heart Failure          History of Present Illness:    Scott Greer is a 67 y.o. male with a hx of CHF, DM, HTN. I met him in the hospital Feb. 2022 EF 25-30%, mildly - moderately elevated pulmonary HTN He was last seen by Dr. 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   January 01, 2023: Scott Greer is seen today for follow-up of his severe LV dysfunction.  He has moderately elevated pulmonary hypertension.  Has been homeless, Is at the [facility] currently  Anticipates losing his spot in the [facility] at the end of March .  Is applying for apartments  Is able to get his meds [facility]   Breathing has been ok .  Will repeat his echo to see if his LV function has improved He needs to seen in the advanced CHF clinic He needs to various resources that are available through the Heart failure clinic   Aug. 29, 2024 Scott Greer is seen for follow up of his severe LV dysfunction Has been seen by Dr Shirlee Latch in the CHF clinic He should be following up there      Past Medical History:  Diagnosis Date   CHF (congestive heart failure) (HCC)    Dehiscence of amputation stump (HCC)    left great toe   Diabetes mellitus without complication (HCC) 1999   Type II   Glaucoma 2015   Hyperlipidemia    Hypertension 2005   Left foot infection 02/07/2021    Osteomyelitis (HCC)    Wears glasses     Past Surgical History:  Procedure Laterality Date   AMPUTATION Left 06/05/2019   Procedure: LEFT GREAT TOE AMPUTATION;  Surgeon: [name], MD;  Location: [facility] OR;  Service: Orthopedics;  Laterality: Left;   AMPUTATION Left 07/10/2019   Procedure: LEFT FOOT 1ST RAY AMPUTATION;  Surgeon: [name], MD;  Location: [facility] OR;  Service: Orthopedics;  Laterality: Left;   AMPUTATION Right 05/25/2020   Procedure: RIGHT GREAT TOE AMPUTATION;  Surgeon: [name], MD;  Location: [facility] OR;  Service: Orthopedics;  Laterality: Right;   AMPUTATION Left 01/25/2021   Procedure: LEFT 2ND TOE AMPUTATION;  Surgeon: [name], MD;  Location: [facility] OR;  Service: Orthopedics;  Laterality: Left;   AMPUTATION Left 02/08/2021   Procedure: LEFT TRANSMETATARSAL AMPUTATION;  Surgeon: [name], MD;  Location: [facility] OR;  Service: Orthopedics;  Laterality: Left;   AMPUTATION Left 02/10/2021   Procedure: AMPUTATION BELOW KNEE;  Surgeon: [name], MD;  Location: [facility] OR;  Service: Orthopedics;  Laterality: Left;   AMPUTATION Right 04/14/2021   Procedure: RIGHT 1ST AND 2ND RAY AMPUTATION;  Surgeon: [name], MD;  Location: [facility] OR;  Service: Orthopedics;  Laterality: Right;   AMPUTATION Right 04/26/2021   Procedure: RIGHT BELOW KNEE AMPUTATION;  Surgeon: [name], MD;  Location: [facility] OR;  Service: Orthopedics;  Laterality: Right;   NO PAST SURGERIES     RIGHT/LEFT HEART CATH AND CORONARY ANGIOGRAPHY N/A 01/02/2021   Procedure: RIGHT/LEFT HEART CATH AND CORONARY ANGIOGRAPHY;  Surgeon: Runell Gess, MD;  Location: [facility] INVASIVE CV LAB;  Service: Cardiovascular;  Laterality: N/A;    Current Medications: No outpatient medications have been marked as taking for the 07/04/23 encounter (Office Visit) with Toniqua Melamed, Deloris Ping, MD.     Allergies:   Bee venom   Social History   Socioeconomic History   Marital status: Divorced    Spouse name: Tawana   Number of children: 2    Years of education: Not on file   Highest education level: Associate degree: occupational, Scientist, product/process development, or vocational program  Occupational History   Not on file  Tobacco Use   Smoking status: Every Day    Types: Cigars   Smokeless tobacco: Former    Types: Chew   Tobacco comments:    8 daily  Vaping Use   Vaping status: Never Used  Substance and Sexual Activity   Alcohol use: Yes    Alcohol/week: 11.0 standard drinks of alcohol    Types: 3 Cans of beer, 8 Shots of liquor per week    Comment: occasional   Drug use: Yes    Types: Marijuana    Comment: ocassional - last time 05/08/2020   Sexual activity: Not Currently  Other Topics Concern   Not on file  Social History Narrative   Out of prison for 2 months.   Lives at home with his wife.   Social Determinants of Health   Financial Resource Strain: High Risk (10/18/2021)   Overall Financial Resource Strain (CARDIA)    Difficulty of Paying Living Expenses: Very hard  Food Insecurity: Food Insecurity Present (04/09/2023)   Hunger Vital Sign    Worried About Running Out of Food in the Last Year: Sometimes true    Ran Out of Food in the Last Year: Sometimes true  Transportation Needs: Unmet Transportation Needs (04/09/2023)   PRAPARE - Administrator, Civil Service (Medical): Yes    Lack of Transportation (Non-Medical): Yes  Physical Activity: Inactive (12/11/2021)   Exercise Vital Sign    Days of Exercise per Week: 0 days    Minutes of Exercise per Session: 0 min  Stress: Stress Concern Present (02/27/2022)   [employer] of Occupational Health - Occupational Stress Questionnaire    Feeling of Stress : Very much  Social Connections: Socially Isolated (10/18/2021)   Social Connection and Isolation Panel [NHANES]    Frequency of Communication with Friends and Family: Three times a week    Frequency of Social Gatherings with Friends and Family: Twice a week    Attends Religious Services: Never    Doctor, general practice or Organizations: No    Attends Engineer, structural: Never    Marital Status: Divorced     Family History: The patient's family history includes Diabetes in his mother; Stroke in his mother.  ROS:   Please see the history of present illness.     All other systems reviewed and are negative.  EKGs/Labs/Other Studies Reviewed:    The following studies were reviewed today:   EKG:    Recent Labs:  02/11/2023: B Natriuretic Peptide 43.2 04/08/2023: Magnesium 2.3 04/09/2023: Hemoglobin 8.8; Platelets 267; TSH 1.345 04/17/2023: ALT 10; BUN 32; Creatinine, Ser 2.06; Potassium 5.0; Sodium 141  Recent Lipid Panel    Component Value Date/Time   CHOL 157 08/14/2022 1458   TRIG 153 (H) 08/14/2022 1458   HDL 60 08/14/2022 1458   CHOLHDL 2.6 08/14/2022 1458   LDLCALC 71 08/14/2022 1458     Risk Assessment/Calculations:           Physical Exam:    Physical Exam: There were no vitals taken for this visit.  No BP recorded.  {Refresh Note OR Click here to enter BP  :1}***   GEN:  middle age man,  bilateral BKAs ,  in no acute distress, examined in wheelchair  HEENT: Normal NECK: No JVD; No carotid bruits LYMPHATICS: No lymphadenopathy CARDIAC: RRR , no murmurs, rubs, gallops RESPIRATORY:  Clear to auscultation without rales, wheezing or rhonchi  ABDOMEN: Soft, non-tender, non-distended MUSCULOSKELETAL:  No edema; No deformity  SKIN: Warm and dry NEUROLOGIC:  Alert and oriented x 3   ASSESSMENT:    No diagnosis found.   PLAN:       Chronic systolic congestive heart failure:                Medication Adjustments/Labs and Tests Ordered: Current medicines are reviewed at length with the patient today.  Concerns regarding medicines are outlined above.  No orders of the defined types were placed in this encounter.  No orders of the defined types were placed in this encounter.   There are no Patient Instructions on file for this visit.    Signed, Kristeen Miss, MD  07/03/2023 6:59 PM    [facility]

## 2023-07-04 ENCOUNTER — Ambulatory Visit: Payer: Medicaid Other | Attending: Cardiovascular Disease | Admitting: Cardiovascular Disease

## 2023-07-18 ENCOUNTER — Telehealth: Payer: Self-pay

## 2023-07-18 NOTE — Telephone Encounter (Signed)
Attempted to contact patient for BP follow up. Both numbers listed for patient are invalid.   Called number listed for sister Eber Jones). Sister stated that she has not been in contact with him in over 2 years and requested to be removed as an emergency contact.

## 2023-07-23 ENCOUNTER — Ambulatory Visit: Payer: Medicaid Other | Admitting: Physical Therapy

## 2023-08-01 NOTE — Telephone Encounter (Signed)
Unable to reach patient using any of the numbers listed.

## 2023-08-08 ENCOUNTER — Other Ambulatory Visit (HOSPITAL_COMMUNITY): Payer: Self-pay

## 2023-11-05 ENCOUNTER — Emergency Department (HOSPITAL_COMMUNITY)
Admission: EM | Admit: 2023-11-05 | Discharge: 2023-11-06 | Disposition: A | Discharge status: Home / Self Care | Payer: Medicare Other | Attending: Emergency Medicine | Admitting: Emergency Medicine

## 2023-11-05 ENCOUNTER — Other Ambulatory Visit: Payer: Self-pay

## 2023-11-05 ENCOUNTER — Emergency Department (HOSPITAL_COMMUNITY): Payer: Medicare Other

## 2023-11-05 ENCOUNTER — Encounter (HOSPITAL_COMMUNITY): Payer: Self-pay

## 2023-11-05 DIAGNOSIS — J069 Acute upper respiratory infection, unspecified: Secondary | ICD-10-CM | POA: Insufficient documentation

## 2023-11-05 DIAGNOSIS — N189 Chronic kidney disease, unspecified: Secondary | ICD-10-CM | POA: Insufficient documentation

## 2023-11-05 DIAGNOSIS — E1122 Type 2 diabetes mellitus with diabetic chronic kidney disease: Secondary | ICD-10-CM | POA: Diagnosis not present

## 2023-11-05 DIAGNOSIS — I129 Hypertensive chronic kidney disease with stage 1 through stage 4 chronic kidney disease, or unspecified chronic kidney disease: Secondary | ICD-10-CM | POA: Insufficient documentation

## 2023-11-05 DIAGNOSIS — Z7982 Long term (current) use of aspirin: Secondary | ICD-10-CM | POA: Diagnosis not present

## 2023-11-05 DIAGNOSIS — Z20822 Contact with and (suspected) exposure to covid-19: Secondary | ICD-10-CM | POA: Diagnosis not present

## 2023-11-05 DIAGNOSIS — Z7984 Long term (current) use of oral hypoglycemic drugs: Secondary | ICD-10-CM | POA: Diagnosis not present

## 2023-11-05 DIAGNOSIS — Z79899 Other long term (current) drug therapy: Secondary | ICD-10-CM | POA: Insufficient documentation

## 2023-11-05 DIAGNOSIS — R059 Cough, unspecified: Secondary | ICD-10-CM | POA: Diagnosis present

## 2023-11-05 LAB — CBC WITH DIFFERENTIAL/PLATELET
Abs Immature Granulocytes: 0.02 10*3/uL (ref 0.00–0.07)
Basophils Absolute: 0 10*3/uL (ref 0.0–0.1)
Basophils Relative: 0 %
Eosinophils Absolute: 0.1 10*3/uL (ref 0.0–0.5)
Eosinophils Relative: 1 %
HCT: 31.3 % — ABNORMAL LOW (ref 39.0–52.0)
Hemoglobin: 10.2 g/dL — ABNORMAL LOW (ref 13.0–17.0)
Immature Granulocytes: 0 %
Lymphocytes Relative: 13 %
Lymphs Abs: 1.1 10*3/uL (ref 0.7–4.0)
MCH: 27.9 pg (ref 26.0–34.0)
MCHC: 32.6 g/dL (ref 30.0–36.0)
MCV: 85.8 fL (ref 80.0–100.0)
Monocytes Absolute: 0.7 10*3/uL (ref 0.1–1.0)
Monocytes Relative: 8 %
Neutro Abs: 6.5 10*3/uL (ref 1.7–7.7)
Neutrophils Relative %: 78 %
Platelets: 280 10*3/uL (ref 150–400)
RBC: 3.65 MIL/uL — ABNORMAL LOW (ref 4.22–5.81)
RDW: 14.8 % (ref 11.5–15.5)
WBC: 8.4 10*3/uL (ref 4.0–10.5)
nRBC: 0 % (ref 0.0–0.2)

## 2023-11-05 LAB — RESP PANEL BY RT-PCR (RSV, FLU A&B, COVID)  RVPGX2
Influenza A by PCR: NEGATIVE
Influenza B by PCR: NEGATIVE
Resp Syncytial Virus by PCR: NEGATIVE
SARS Coronavirus 2 by RT PCR: NEGATIVE

## 2023-11-05 LAB — BASIC METABOLIC PANEL
Anion gap: 8 (ref 5–15)
BUN: 28 mg/dL — ABNORMAL HIGH (ref 8–23)
CO2: 24 mmol/L (ref 22–32)
Calcium: 9.6 mg/dL (ref 8.9–10.3)
Chloride: 104 mmol/L (ref 98–111)
Creatinine, Ser: 1.47 mg/dL — ABNORMAL HIGH (ref 0.61–1.24)
GFR, Estimated: 53 mL/min — ABNORMAL LOW (ref >60)
Glucose, Bld: 307 mg/dL — ABNORMAL HIGH (ref 70–99)
Potassium: 4.5 mmol/L (ref 3.5–5.1)
Sodium: 136 mmol/L (ref 135–145)

## 2023-11-05 LAB — CBG MONITORING, ED: Glucose-Capillary: 307 mg/dL — ABNORMAL HIGH (ref 70–99)

## 2023-11-05 NOTE — ED Provider Triage Note (Signed)
 Emergency Medicine Provider Triage Evaluation Note  JURIEL CID , a 67 y.o. male  was evaluated in triage.  Pt complains of cough sputum production body aches.  Review of Systems  Positive: Cough Negative: Chest pain shortness of breath  Physical Exam  BP (!) 196/95 (BP Location: Left Arm)   Pulse 87   Temp 98.8 F (37.1 C) (Oral)   Resp 16   SpO2 100%  Gen:   Awake, no distress   Resp:  Normal effort  MSK:   Moves extremities without difficulty  Other:    Medical Decision Making  Medically screening exam initiated at 8:48 PM.  Appropriate orders placed.  BUDDIE MARSTON was informed that the remainder of the evaluation will be completed by another provider, this initial triage assessment does not replace that evaluation, and the importance of remaining in the ED until their evaluation is complete.     Ruthe Cornet, DO 11/05/23 2048

## 2023-11-05 NOTE — ED Triage Notes (Addendum)
 [facility] ED [facility]. Apparently Police called EMS d/t pt WC being out of battery, pt now c/o flu sx, cough, body aches x 1 days. Pt is bilat bka. Denies N/V/D. Pt is wet from the rain  Reports homelessness  Last VS:   CBG 393. BP 156 PALP. 90 HR.

## 2023-11-06 NOTE — ED Provider Notes (Signed)
 [place] EMERGENCY DEPARTMENT AT [facility] Provider Note   CSN: [id number] Arrival date & time: 11/05/23  2036     History  Chief Complaint  Patient presents with   Homeless   flu symptoms    Scott Greer is a 68 y.o. male with history of hypertension, diabetes, CKD, presents with concern for body aches and cough for the past day.  Denies any nausea, vomiting, abdominal pain.  Denies any fever, but endorses chills.  HPI     Home Medications Prior to Admission medications   Medication Sig Start Date End Date Taking? Authorizing Provider  aspirin  (ASPIRIN  LOW DOSE) 81 MG chewable tablet CHEW ONE TABLET BY MOUTH ONCE DAILY Needs appointment for further refills 10/25/22   Danton Slough M, PA-C  atorvastatin  (LIPITOR) 10 MG tablet Take 1 tablet (10 mg total) by mouth every evening. 12/14/22   Vicci Barnie NOVAK, MD  empagliflozin  (JARDIANCE ) 10 MG TABS tablet Take 1 tablet (10 mg total) by mouth daily at 12 noon. 12/14/22   Vicci Barnie NOVAK, MD  ferrous sulfate  (FEROSUL) 325 (65 FE) MG tablet TAKE ONE TABLET BY MOUTH EVERY MORNING WITH BREAKFAST 08/15/22   Vicci Barnie NOVAK, MD  gabapentin  (NEURONTIN ) 300 MG capsule Take 1 capsule (300 mg total) by mouth 2 (two) times daily. 01/17/23   Vicci Barnie NOVAK, MD  glimepiride  (AMARYL ) 1 MG tablet Take 2 tablets (2 mg total) by mouth daily with breakfast. 04/17/23   Danton Slough HERO, PA-C  isosorbide -hydrALAZINE  (BIDIL ) 20-37.5 MG tablet Take 1 tablet by mouth 2 (two) times daily. 12/14/22   Vicci Barnie NOVAK, MD  torsemide  (DEMADEX ) 10 MG tablet Take 1 tablet (10 mg total) by mouth daily. 04/10/23   Jillian Buttery, MD      Allergies    Bee venom    Review of Systems   Review of Systems  Constitutional:  Negative for fever.    Physical Exam Updated Vital Signs BP (!) 173/93 (BP Location: Right Arm)   Pulse 87   Temp 97.9 F (36.6 C) (Oral)   Resp 18   SpO2 94%  Physical Exam Vitals and nursing note reviewed.   Constitutional:      General: He is not in acute distress.    Appearance: He is well-developed.  HENT:     Head: Normocephalic and atraumatic.  Eyes:     Conjunctiva/sclera: Conjunctivae normal.  Cardiovascular:     Rate and Rhythm: Normal rate and regular rhythm.     Heart sounds: No murmur heard. Pulmonary:     Effort: Pulmonary effort is normal. No respiratory distress.     Breath sounds: Normal breath sounds.  Abdominal:     Palpations: Abdomen is soft.     Tenderness: There is no abdominal tenderness.  Musculoskeletal:        General: No swelling.     Cervical back: Neck supple.  Skin:    General: Skin is warm and dry.     Capillary Refill: Capillary refill takes less than 2 seconds.  Neurological:     Mental Status: He is alert.  Psychiatric:        Mood and Affect: Mood normal.     ED Results / Procedures / Treatments   Labs (all labs ordered are listed, but only abnormal results are displayed) Labs Reviewed  CBC WITH DIFFERENTIAL/PLATELET - Abnormal; Notable for the following components:      Result Value   RBC 3.65 (*)    Hemoglobin 10.2 (*)  HCT 31.3 (*)    All other components within normal limits  BASIC METABOLIC PANEL - Abnormal; Notable for the following components:   Glucose, Bld 307 (*)    BUN 28 (*)    Creatinine, Ser 1.47 (*)    GFR, Estimated 53 (*)    All other components within normal limits  CBG MONITORING, ED - Abnormal; Notable for the following components:   Glucose-Capillary 307 (*)    All other components within normal limits  RESP PANEL BY RT-PCR (RSV, FLU A&B, COVID)  RVPGX2    EKG None  Radiology DG Chest 2 View Result Date: 11/05/2023 CLINICAL DATA:  COUGH EXAM: CHEST - 2 VIEW COMPARISON:  04/08/2023. FINDINGS: Cardiac silhouette is unremarkable. No pneumothorax or pleural effusion. The lungs are clear. The visualized skeletal structures are unremarkable. IMPRESSION: No acute cardiopulmonary process. Electronically Signed    By: Fonda Field M.D.   On: 11/05/2023 22:58    Procedures Procedures    Medications Ordered in ED Medications - No data to display  ED Course/ Medical Decision Making/ A&P                                 Medical Decision Making    Differential diagnosis includes but is not limited to COVID, flu, RSV, viral URI, strep pharyngitis, viral pharyngitis, allergic rhinitis, pneumonia, bronchitis   ED Course:  Patient well-appearing, no acute distress.  Stable vital signs aside from a elevated blood pressure of 173/93.  His flu, COVID, RSV is negative.  CBC, BMP at patient baseline.  Chest x-ray negative for pneumonia.  Suspect viral URI.  Also discussed with patient that his blood pressure was elevated here today and he needs to follow-up with a primary care provider for further management.  Patient stable and appropriate for discharge home   Impression: Viral URI  Disposition:  The patient was discharged home with instructions to use over-the-counter medications as needed for symptoms.  Follow-up with PCP within the next month discuss blood pressure management. Return precautions given.   Imaging Studies ordered: I ordered imaging studies including chest x-ray  I independently visualized the imaging with scope of interpretation limited to determining acute life threatening conditions related to emergency care. Imaging showed no consolidations I agree with the radiologist interpretation               Final Clinical Impression(s) / ED Diagnoses Final diagnoses:  Viral URI with cough    Rx / DC Orders ED Discharge Orders     None         Veta Palma, PA-C 11/06/23 9180    Jerrol Agent, MD 11/06/23 1128

## 2023-11-06 NOTE — Discharge Instructions (Addendum)
 You appear to have an upper respiratory infection (URI). An upper respiratory tract infection, or cold, is a viral infection of the air passages leading to the lungs. It should improve gradually after 5-7 days. You may have a lingering cough that lasts for 2- 4 weeks after the infection.  Your flu, covid, and RSV test were negative today.  Your chest x-ray did not show any signs of pneumonia.  Your blood pressure was high today.  Please follow-up with your primary care provider within the next month for further blood pressure management.  There are no medications, such as antibiotics, that will cure your infection.   Home care instructions:  You can take Tylenol  and/or Ibuprofen  as directed on the packaging for fever reduction and pain relief.    For cough: honey 1/2 to 1 teaspoon (you can dilute the honey in water  or another fluid).  You can also use guaifenesin  and dextromethorphan for cough which are over-the-counter medications. You can use a humidifier for chest congestion and cough.  If you don't have a humidifier, you can sit in the bathroom with the hot shower running.      For sore throat: try warm salt water  gargles, cepacol lozenges, throat spray, warm tea or water  with lemon/honey, popsicles or ice, or OTC cold relief medicine for throat discomfort.    For congestion: Flonase 1-2 sprays in each nostril daily.    It is important to stay hydrated: drink plenty of fluids (water , gatorade/powerade/pedialyte, juices, or teas) to keep your throat moisturized and help further relieve irritation/discomfort.   Your illness is contagious and can be spread to others, especially during the first 3 or 4 days. It cannot be cured by antibiotics or other medicines. Take basic precautions such as washing your hands often, covering your mouth when you cough or sneeze, and avoiding public places where you could spread your illness to others.   Follow-up instructions: Please follow-up with your primary  care provider for further evaluation of your symptoms if you are not feeling better within the next 5 days.   Return instructions:  Please return to the Emergency Department if you experience worsening symptoms.  RETURN IMMEDIATELY IF you develop shortness of breath, confusion or altered mental status, a new rash, become dizzy, faint, or poorly responsive, or are unable to be cared for at home. Please return if you have persistent vomiting and cannot keep down fluids or develop a fever that is not controlled by tylenol  or motrin .   Please return if you have any other emergent concerns.

## 2023-11-06 NOTE — ED Notes (Signed)
[facility] called, 3rd in line °

## 2023-11-10 ENCOUNTER — Emergency Department (HOSPITAL_COMMUNITY)
Admission: EM | Admit: 2023-11-10 | Discharge: 2023-11-12 | Disposition: A | Discharge status: Home / Self Care | Payer: Medicare Other | Attending: Emergency Medicine | Admitting: Emergency Medicine

## 2023-11-10 DIAGNOSIS — I509 Heart failure, unspecified: Secondary | ICD-10-CM | POA: Diagnosis not present

## 2023-11-10 DIAGNOSIS — R600 Localized edema: Secondary | ICD-10-CM | POA: Insufficient documentation

## 2023-11-10 DIAGNOSIS — Z7984 Long term (current) use of oral hypoglycemic drugs: Secondary | ICD-10-CM | POA: Diagnosis not present

## 2023-11-10 DIAGNOSIS — E1165 Type 2 diabetes mellitus with hyperglycemia: Secondary | ICD-10-CM

## 2023-11-10 DIAGNOSIS — Z7982 Long term (current) use of aspirin: Secondary | ICD-10-CM | POA: Diagnosis not present

## 2023-11-10 DIAGNOSIS — M7989 Other specified soft tissue disorders: Secondary | ICD-10-CM | POA: Diagnosis present

## 2023-11-10 DIAGNOSIS — E119 Type 2 diabetes mellitus without complications: Secondary | ICD-10-CM | POA: Diagnosis not present

## 2023-11-10 DIAGNOSIS — I110 Hypertensive heart disease with heart failure: Secondary | ICD-10-CM | POA: Diagnosis not present

## 2023-11-10 LAB — COMPREHENSIVE METABOLIC PANEL
ALT: 16 U/L (ref 0–44)
AST: 17 U/L (ref 15–41)
Albumin: 3.2 g/dL — ABNORMAL LOW (ref 3.5–5.0)
Alkaline Phosphatase: 43 U/L (ref 38–126)
Anion gap: 12 (ref 5–15)
BUN: 29 mg/dL — ABNORMAL HIGH (ref 8–23)
CO2: 22 mmol/L (ref 22–32)
Calcium: 9.2 mg/dL (ref 8.9–10.3)
Chloride: 101 mmol/L (ref 98–111)
Creatinine, Ser: 1.77 mg/dL — ABNORMAL HIGH (ref 0.61–1.24)
GFR, Estimated: 42 mL/min — ABNORMAL LOW (ref >60)
Glucose, Bld: 449 mg/dL — ABNORMAL HIGH (ref 70–99)
Potassium: 4.5 mmol/L (ref 3.5–5.1)
Sodium: 135 mmol/L (ref 135–145)
Total Bilirubin: 0.6 mg/dL (ref 0.0–1.2)
Total Protein: 5.9 g/dL — ABNORMAL LOW (ref 6.5–8.1)

## 2023-11-10 LAB — CBC
HCT: 26.4 % — ABNORMAL LOW (ref 39.0–52.0)
Hemoglobin: 8.7 g/dL — ABNORMAL LOW (ref 13.0–17.0)
MCH: 28.1 pg (ref 26.0–34.0)
MCHC: 33 g/dL (ref 30.0–36.0)
MCV: 85.2 fL (ref 80.0–100.0)
Platelets: 269 10*3/uL (ref 150–400)
RBC: 3.1 MIL/uL — ABNORMAL LOW (ref 4.22–5.81)
RDW: 14.8 % (ref 11.5–15.5)
WBC: 7.2 10*3/uL (ref 4.0–10.5)
nRBC: 0 % (ref 0.0–0.2)

## 2023-11-10 LAB — CBG MONITORING, ED
Glucose-Capillary: 339 mg/dL — ABNORMAL HIGH (ref 70–99)
Glucose-Capillary: 171 mg/dL — ABNORMAL HIGH (ref 70–99)

## 2023-11-10 LAB — BRAIN NATRIURETIC PEPTIDE: B Natriuretic Peptide: 57.1 pg/mL (ref 0.0–100.0)

## 2023-11-10 MED ORDER — GABAPENTIN 300 MG PO CAPS
300.0000 mg | ORAL_CAPSULE | Freq: Once | ORAL | Status: AC
  Administered 2023-11-10: 300 mg via ORAL
  Filled 2023-11-10: qty 1

## 2023-11-10 MED ORDER — INSULIN ASPART 100 UNIT/ML IJ SOLN
5.0000 [IU] | Freq: Once | INTRAMUSCULAR | Status: AC
  Administered 2023-11-10: 5 [IU] via SUBCUTANEOUS

## 2023-11-10 MED ORDER — INSULIN ASPART 100 UNIT/ML IJ SOLN: 8.0000 [IU] | Freq: Once | INTRAMUSCULAR | Status: DC

## 2023-11-10 MED ORDER — OXYCODONE-ACETAMINOPHEN 5-325 MG PO TABS
1.0000 | ORAL_TABLET | Freq: Once | ORAL | Status: AC
  Administered 2023-11-10: 1 via ORAL
  Filled 2023-11-10: qty 1

## 2023-11-10 NOTE — ED Provider Notes (Signed)
 Patient given in sign out by Barrett, PA-C.  Please review their note for patient HPI, physical exam, workup.  At this time the plan is follow-up on BNP and anticipate discharge.  I went to go evaluate the patient patient did have bilateral swelling of his below the knee amputation sites.  There did not appear to be any erythema or warmth or wounds or be indicative of infection and patient was not endorsing any infectious symptoms.  I spoke to the patient about possible x-rays however patient declined at this time.  Patient states he has not been taking his medications and 1 of these does include gabapentin .  Patient states pain did get better with the pain meds earlier however do feel this could be neuropathic pain and will give patient his prescribed dose of gabapentin  here.  Still waiting on the BNP.  BNP was not elevated and so do not feel this is related to heart failure.  Glucose was elevated and so patient was given NovoLog  which did bring his sugars down to a reasonable level.  Patient did not take his medications do feel this is the reason for patient's hyperglycemia.  Patient's labs do not show any signs of HHS or DKA that would necessitate further workup.  I spoke to the patient about taking his medications as prescribed to avoid the hyperglycemia.  Attending evaluated patient and we will find compression stockings for the patient or if we cannot find them will prescribe him.  At this time patient is stable to be discharged.  Patient given strict return precautions.  Patient verbalized understanding acceptance of this plan.   Scott Lynwood DASEN, PA-C 11/10/23 1931    Franklyn Sid SAILOR, MD 11/15/23 [phone]

## 2023-11-10 NOTE — Discharge Instructions (Addendum)
 Please follow-up with a primary care provider regards recent symptoms and ER visit.  Today your labs did show your sugar was elevated and we brought this back down with insulin  however you will need to take your medications to keep your sugar low.  Please pick up your gabapentin  as well for your leg pain.  Keep legs elevated, use compression sleeve, take Lasix  as needed. Please follow up with cardiology team, return to emergency room with new or worsening symptoms.

## 2023-11-10 NOTE — ED Provider Notes (Signed)
 [place] EMERGENCY DEPARTMENT AT [facility] Provider Note   CSN: [id number] Arrival date & time: 11/10/23  9072     History  Chief Complaint  Patient presents with   Leg Pain    Scott Greer is a 68 y.o. male hypertension, diabetes, hyperlipidemia, congestive heart failure, history of osteomyelitis reporting to emergency room with bilateral extremity pain.  Patient has history of bilateral below the knee amputation.  This has been ongoing for 3 days, worse this morning.  He reports that he has had significant swelling below both knees that occurred very rapidly.  Reports he has not been taking his medications.  Has not tried anything for symptoms.  Denies any chest pain, shortness of breath cough abdominal pain nausea vomiting diarrhea or associated symptoms.  Requesting pain medicine.   Leg Pain      Home Medications Prior to Admission medications   Medication Sig Start Date End Date Taking? Authorizing Provider  aspirin  (ASPIRIN  LOW DOSE) 81 MG chewable tablet CHEW ONE TABLET BY MOUTH ONCE DAILY Needs appointment for further refills 10/25/22   Danton Slough M, PA-C  atorvastatin  (LIPITOR) 10 MG tablet Take 1 tablet (10 mg total) by mouth every evening. 12/14/22   Vicci Barnie NOVAK, MD  empagliflozin  (JARDIANCE ) 10 MG TABS tablet Take 1 tablet (10 mg total) by mouth daily at 12 noon. 12/14/22   Vicci Barnie NOVAK, MD  ferrous sulfate  (FEROSUL) 325 (65 FE) MG tablet TAKE ONE TABLET BY MOUTH EVERY MORNING WITH BREAKFAST 08/15/22   Vicci Barnie NOVAK, MD  gabapentin  (NEURONTIN ) 300 MG capsule Take 1 capsule (300 mg total) by mouth 2 (two) times daily. 01/17/23   Vicci Barnie NOVAK, MD  glimepiride  (AMARYL ) 1 MG tablet Take 2 tablets (2 mg total) by mouth daily with breakfast. 04/17/23   Danton Slough HERO, PA-C  isosorbide -hydrALAZINE  (BIDIL ) 20-37.5 MG tablet Take 1 tablet by mouth 2 (two) times daily. 12/14/22   Vicci Barnie NOVAK, MD  torsemide  (DEMADEX ) 10 MG tablet Take 1  tablet (10 mg total) by mouth daily. 04/10/23   Jillian Buttery, MD      Allergies    Bee venom    Review of Systems   Review of Systems  Cardiovascular:  Positive for leg swelling.    Physical Exam Updated Vital Signs BP (!) 145/73 (BP Location: Left Arm)   Pulse 85   Temp 98.7 F (37.1 C)   Resp 18   SpO2 100%  Physical Exam Vitals and nursing note reviewed.  Constitutional:      General: He is not in acute distress.    Appearance: He is not toxic-appearing.  HENT:     Head: Normocephalic and atraumatic.     Nose: No congestion or rhinorrhea.  Eyes:     General: No scleral icterus.    Conjunctiva/sclera: Conjunctivae normal.  Cardiovascular:     Rate and Rhythm: Normal rate and regular rhythm.     Pulses: Normal pulses.     Heart sounds: Normal heart sounds.  Pulmonary:     Effort: Pulmonary effort is normal. No respiratory distress.     Breath sounds: Normal breath sounds.  Abdominal:     General: Abdomen is flat. Bowel sounds are normal.     Palpations: Abdomen is soft.     Tenderness: There is no abdominal tenderness.  Musculoskeletal:     Right lower leg: Edema present.     Left lower leg: Edema present.     Comments: History of  bilateral but noted knee amputation, patient has bilateral swelling of both extremity.  Skin:    General: Skin is warm and dry.     Findings: No lesion.  Neurological:     General: No focal deficit present.     Mental Status: He is alert and oriented to person, place, and time. Mental status is at baseline.     ED Results / Procedures / Treatments   Labs (all labs ordered are listed, but only abnormal results are displayed) Labs Reviewed  BRAIN NATRIURETIC PEPTIDE  COMPREHENSIVE METABOLIC PANEL  CBC    EKG None  Radiology No results found.  Procedures Procedures    Medications Ordered in ED Medications - No data to display  ED Course/ Medical Decision Making/ A&P                                 Medical  Decision Making Amount and/or Complexity of Data Reviewed Labs: ordered.  Risk Prescription drug management.   Lorrene LITTIE Mae 68 y.o. presented today for BLE edema. Working Ddx: dependent edema, venous insufficiency, thrombophlebitis, secondary to medications, CHF, edema, AKI, nephrotic syndrome, dvt   R/o DDx: These are considered less likely than current impression due to history of present illness, physical exam, labs/imaging findings.  Review of prior external notes: 11/06/23 visit to ED  Unique Tests and My Interpretation:  BC without leukocytosis, hemoglobin 8.7 CMP without significant leukocytosis.  Slight elevation in creatinine from baseline. BNP: pending   Do not feel imaging will be beneficial at this time, will start with labs and re-assess   Problem List / ED Course / Critical interventions / Medication management  Reporting with bilateral lower extremity edema.  Patient has history of amputation.  No obvious sign of infection or cellulitis on exam.  No pitting edema.  Patient denies any associated symptoms, no shortness of breath no chest pain.  Obtaining labs to further evaluate.  Will give pain medicine.  Patient well-appearing and hemodynamically stable no oxygen demand. I ordered medication including oxycodone  for pain Reevaluation of the patient after these medicines showed that the patient improved Patients vitals assessed. Upon arrival patient is hemodynamically stable.  I have reviewed the patients home medicines and have made adjustments as needed   Consult: None    Plan:  Signed off, pending BNP and reassessment.  Anticipate stable for discharge.         Final Clinical Impression(s) / ED Diagnoses Final diagnoses:  Bilateral lower extremity edema    Rx / DC Orders ED Discharge Orders     None         Shermon Naziyah Tieszen N, PA-C 11/10/23 JEROLYN Randol Simmonds, MD 11/10/23 [phone]

## 2023-11-10 NOTE — ED Notes (Signed)
 [facility] called

## 2023-11-10 NOTE — ED Notes (Signed)
 Pt alert, NAD, calm, interactive, speaking with pharm tech at [facility]
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
## 2023-11-10 NOTE — ED Notes (Signed)
 EDP at [facility]

## 2023-11-11 ENCOUNTER — Other Ambulatory Visit: Payer: Self-pay

## 2023-11-11 ENCOUNTER — Encounter (HOSPITAL_COMMUNITY): Payer: Self-pay

## 2023-11-11 NOTE — Progress Notes (Addendum)
 12:05pm: CSW received call from [place] who states patient's wheelchair is at [employer] still because upon driver arrival, chair did not have a sufficient battery life and required charging. Danial states the driver will obtain the wheelchair once charged, bring it to [facility] and get patient into the chair then return him to the shelter.  CSW informed RN of information.  9am: CSW spoke with Angie at [facility] who confirms patient's power wheelchair is at the facility. Angie states patient does not reside at the shelter but that he can come get his wheelchair.  CSW spoke with Brynen at [employer] to arrange transportation to [facility]. Danial states it will be approximately one hour before patient is picked up.  CSW informed RN and MD of information.   Niels Portugal, MSW, LCSW Transitions of Care  Clinical Social Worker II [phone]

## 2023-11-11 NOTE — ED Notes (Addendum)
 This NT and Corean, RN helped pt use the bedside commode. Upon entering the room, it smelled very strongly of old urine. While helping pt over to the bedside commode, we noticed that the pts bed and clothes were completely saturated in urine that had obviously been there for a lengthy period of time. Pts linens completely changed, pt bathed and changed into clean, dry clothes. Floor also wiped down with bleach wipes due to urine on floor. Pt stated that he had not been helped before. Pt now speaking to family on phone.

## 2023-11-12 DIAGNOSIS — R600 Localized edema: Secondary | ICD-10-CM | POA: Diagnosis not present

## 2023-11-12 MED ORDER — INSULIN ASPART 100 UNIT/ML IJ SOLN
0.0000 [IU] | Freq: Three times a day (TID) | INTRAMUSCULAR | Status: DC
Start: 2023-11-12 — End: 2023-11-12

## 2023-11-12 MED ORDER — INSULIN ASPART 100 UNIT/ML IJ SOLN
0.0000 [IU] | Freq: Every day | INTRAMUSCULAR | Status: DC
Start: 2023-11-12 — End: 2023-11-12

## 2023-11-12 MED ORDER — GABAPENTIN 300 MG PO CAPS
300.0000 mg | ORAL_CAPSULE | Freq: Two times a day (BID) | ORAL | Status: DC
  Administered 2023-11-12: 300 mg via ORAL
  Filled 2023-11-12: qty 1

## 2023-11-12 MED ORDER — TORSEMIDE 20 MG PO TABS
10.0000 mg | ORAL_TABLET | Freq: Every day | ORAL | Status: DC
  Administered 2023-11-12: 10 mg via ORAL
  Filled 2023-11-12: qty 1

## 2023-11-12 MED ORDER — ATORVASTATIN CALCIUM 10 MG PO TABS
10.0000 mg | ORAL_TABLET | Freq: Every day | ORAL | Status: DC
  Administered 2023-11-12: 10 mg via ORAL
  Filled 2023-11-12: qty 1

## 2023-11-12 MED ORDER — ISOSORB DINITRATE-HYDRALAZINE 20-37.5 MG PO TABS
1.0000 | ORAL_TABLET | Freq: Two times a day (BID) | ORAL | Status: DC
  Administered 2023-11-12: 1 via ORAL
  Filled 2023-11-12: qty 1
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
## 2023-11-12 NOTE — ED Provider Notes (Signed)
 Emergency Medicine Observation Re-evaluation Note  Scott Greer is a 68 y.o. male, seen on rounds today.  Pt initially presented to the ED for complaints of Leg Pain Currently, the patient is resting comfortably.  Physical Exam  BP (!) 175/91   Pulse 77   Temp 98.5 F (36.9 C) (Oral)   Resp 18   SpO2 99%  Physical Exam Vitals and nursing note reviewed.  Constitutional:      General: He is not in acute distress.    Appearance: He is well-developed.  HENT:     Head: Normocephalic and atraumatic.  Eyes:     Conjunctiva/sclera: Conjunctivae normal.  Cardiovascular:     Rate and Rhythm: Normal rate and regular rhythm.     Heart sounds: No murmur heard. Pulmonary:     Effort: Pulmonary effort is normal. No respiratory distress.  Musculoskeletal:        General: No swelling.     Cervical back: Neck supple.  Skin:    General: Skin is warm and dry.  Neurological:     Mental Status: He is alert.  Psychiatric:        Mood and Affect: Mood normal.      ED Course / MDM  EKG:   I have reviewed the labs performed to date as well as medications administered while in observation.  Recent changes in the last 24 hours include awaiting transport back to [employer] where his wheelchair is.  Plan  Current plan is for transfer to [facility].    Albertina Dixon, MD 11/12/23 [phone]

## 2024-01-23 ENCOUNTER — Emergency Department (HOSPITAL_COMMUNITY)
Admission: EM | Admit: 2024-01-23 | Discharge: 2024-01-24 | Discharge status: Against Medical Advice | Attending: Emergency Medicine | Admitting: Emergency Medicine

## 2024-01-23 ENCOUNTER — Other Ambulatory Visit: Payer: Self-pay

## 2024-01-23 ENCOUNTER — Encounter (HOSPITAL_COMMUNITY): Payer: Self-pay

## 2024-01-23 DIAGNOSIS — M79604 Pain in right leg: Secondary | ICD-10-CM | POA: Insufficient documentation

## 2024-01-23 DIAGNOSIS — Z5321 Procedure and treatment not carried out due to patient leaving prior to being seen by health care provider: Secondary | ICD-10-CM | POA: Diagnosis not present

## 2024-01-23 DIAGNOSIS — M79605 Pain in left leg: Secondary | ICD-10-CM | POA: Diagnosis not present

## 2024-01-23 NOTE — ED Triage Notes (Signed)
 Pt arrived via [facility] s/p fall from [facility]. Pt is BBKA. Pt c/o BLE pain 5/10. Pt denies loc. Pt denies hitting head.

## 2024-01-24 ENCOUNTER — Emergency Department (HOSPITAL_COMMUNITY)

## 2024-01-24 ENCOUNTER — Other Ambulatory Visit: Payer: Self-pay

## 2024-01-24 ENCOUNTER — Emergency Department (HOSPITAL_COMMUNITY): Admission: EM | Admit: 2024-01-24 | Discharge: 2024-01-30 | Disposition: A | Discharge status: Home / Self Care

## 2024-01-24 DIAGNOSIS — Z7189 Other specified counseling: Secondary | ICD-10-CM | POA: Diagnosis present

## 2024-01-24 DIAGNOSIS — M25511 Pain in right shoulder: Secondary | ICD-10-CM | POA: Insufficient documentation

## 2024-01-24 DIAGNOSIS — E119 Type 2 diabetes mellitus without complications: Secondary | ICD-10-CM | POA: Diagnosis not present

## 2024-01-24 DIAGNOSIS — M25561 Pain in right knee: Secondary | ICD-10-CM | POA: Diagnosis not present

## 2024-01-24 DIAGNOSIS — M542 Cervicalgia: Secondary | ICD-10-CM | POA: Diagnosis not present

## 2024-01-24 DIAGNOSIS — I110 Hypertensive heart disease with heart failure: Secondary | ICD-10-CM | POA: Insufficient documentation

## 2024-01-24 DIAGNOSIS — W050XXA Fall from non-moving wheelchair, initial encounter: Secondary | ICD-10-CM | POA: Insufficient documentation

## 2024-01-24 DIAGNOSIS — M25562 Pain in left knee: Secondary | ICD-10-CM | POA: Insufficient documentation

## 2024-01-24 DIAGNOSIS — R519 Headache, unspecified: Secondary | ICD-10-CM | POA: Diagnosis present

## 2024-01-24 DIAGNOSIS — Z993 Dependence on wheelchair: Secondary | ICD-10-CM | POA: Diagnosis not present

## 2024-01-24 DIAGNOSIS — Z5900 Homelessness unspecified: Secondary | ICD-10-CM | POA: Diagnosis not present

## 2024-01-24 DIAGNOSIS — W19XXXA Unspecified fall, initial encounter: Secondary | ICD-10-CM

## 2024-01-24 DIAGNOSIS — M25512 Pain in left shoulder: Secondary | ICD-10-CM | POA: Insufficient documentation

## 2024-01-24 DIAGNOSIS — I509 Heart failure, unspecified: Secondary | ICD-10-CM | POA: Diagnosis not present

## 2024-01-24 MED ORDER — OXYCODONE-ACETAMINOPHEN 5-325 MG PO TABS
1.0000 | ORAL_TABLET | Freq: Once | ORAL | Status: AC
  Administered 2024-01-24: 1 via ORAL
  Filled 2024-01-24: qty 1

## 2024-01-24 NOTE — ED Notes (Signed)
 No response 20+ mins for vitals
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
## 2024-01-24 NOTE — ED Provider Notes (Signed)
 [place] EMERGENCY DEPARTMENT AT [facility] Provider Note   CSN: [id number] Arrival date & time: 01/24/24  5284     History  Chief Complaint  Patient presents with   Marletta Lor    Scott Greer is a 68 y.o. male.  This is a 68 year old male with bilateral BKA's presenting emergency department after falling out of his wheelchair earlier this morning.  Reports lost control, wheelchair spun around, and fell backwards, unsure if he hit his head, but then did a somersault and landed on his stump.  Complaining of pain to his head, neck, bilateral shoulders and his bilateral knees.  Denies shortness of breath, chest pain, abdominal pain.  Denies taking blood thinners.     Fall       Home Medications Prior to Admission medications   Medication Sig Start Date End Date Taking? Authorizing Provider  aspirin (ASPIRIN LOW DOSE) 81 MG chewable tablet CHEW ONE TABLET BY MOUTH ONCE DAILY Needs appointment for further refills Patient not taking: Reported on 11/10/2023 10/25/22   [name], PA-C  atorvastatin (LIPITOR) 10 MG tablet Take 1 tablet (10 mg total) by mouth every evening. Patient not taking: Reported on 11/10/2023 12/14/22   [name], MD  empagliflozin (JARDIANCE) 10 MG TABS tablet Take 1 tablet (10 mg total) by mouth daily at 12 noon. Patient not taking: Reported on 11/10/2023 12/14/22   [name], MD  ferrous sulfate (FEROSUL) 325 (65 FE) MG tablet TAKE ONE TABLET BY MOUTH EVERY MORNING WITH BREAKFAST Patient not taking: Reported on 11/10/2023 08/15/22   [name], MD  gabapentin (NEURONTIN) 300 MG capsule Take 1 capsule (300 mg total) by mouth 2 (two) times daily. Patient not taking: Reported on 11/10/2023 01/17/23   [name], MD  glimepiride (AMARYL) 1 MG tablet Take 2 tablets (2 mg total) by mouth daily with breakfast. Patient not taking: Reported on 11/10/2023 04/17/23   [name], PA-C  isosorbide-hydrALAZINE (BIDIL) 20-37.5 MG  tablet Take 1 tablet by mouth 2 (two) times daily. Patient not taking: Reported on 11/10/2023 12/14/22   [name], MD  torsemide (DEMADEX) 10 MG tablet Take 1 tablet (10 mg total) by mouth daily. Patient not taking: Reported on 11/10/2023 04/10/23   Burnadette Pop, MD      Allergies    Bee venom, Metformin, and Oatmeal    Review of Systems   Review of Systems  Physical Exam Updated Vital Signs BP (!) 163/110 (BP Location: Right Arm)   Pulse 90   Temp 98.7 F (37.1 C) (Oral)   Resp 17   SpO2 100%  Physical Exam Vitals and nursing note reviewed.  Constitutional:      General: He is not in acute distress.    Appearance: He is not toxic-appearing.  HENT:     Head: Normocephalic.     Nose: Nose normal.  Eyes:     Conjunctiva/sclera: Conjunctivae normal.  Cardiovascular:     Rate and Rhythm: Normal rate and regular rhythm.  Pulmonary:     Effort: Pulmonary effort is normal.  Abdominal:     General: Abdomen is flat. There is no distension.     Tenderness: There is no abdominal tenderness. There is no guarding or rebound.  Musculoskeletal:     Comments: Chest wall stable nontender.  Pelvis stable nontender.  Does have some tenderness globally to his cervical spine, but seemingly more in musculature rather than midline.  Neurovascular intact in upper and lower  extremities.  No overt deformities.  No lacerations or abrasions appreciated.  Skin:    General: Skin is warm.     Capillary Refill: Capillary refill takes less than 2 seconds.  Neurological:     Mental Status: He is alert and oriented to person, place, and time.  Psychiatric:        Mood and Affect: Mood normal.        Behavior: Behavior normal.     ED Results / Procedures / Treatments   Labs (all labs ordered are listed, but only abnormal results are displayed) Labs Reviewed - No data to display  EKG None  Radiology DG Knee 2 Views Right Result Date: 01/24/2024 CLINICAL DATA:  Trauma, fall from  wheelchair. EXAM: RIGHT KNEE - 1-2 VIEW COMPARISON:  03/19/2022 FINDINGS: No acute fracture or dislocation. Minor peripheral spurring in the patellofemoral compartment. No joint effusion. Below the knee amputation with smooth resection margins. Small quadriceps and patellar tendon enthesophytes. The bones are diffusely under mineralized. Mild generalized soft tissue edema. IMPRESSION: 1. No acute fracture or dislocation of the right knee. 2. Below the knee amputation. Electronically Signed   By: Narda Rutherford M.D.   On: 01/24/2024 15:13   DG Knee 2 Views Left Result Date: 01/24/2024 CLINICAL DATA:  Trauma, fall from wheelchair. EXAM: LEFT KNEE - 1-2 VIEW COMPARISON:  03/19/2022 FINDINGS: Below the knee amputation. Mild heterotopic calcification at the resection margin. No acute fracture or dislocation. Knee alignment is maintained. The bones are diffusely under mineralized. No erosive or destructive change. Mild generalized soft tissue edema. IMPRESSION: 1.  No acute fracture or dislocation. 2. Below the knee amputation.  Osteopenia/osteoporosis. Electronically Signed   By: Narda Rutherford M.D.   On: 01/24/2024 15:13   DG Chest Portable 1 View Result Date: 01/24/2024 CLINICAL DATA:  Trauma, fall from wheelchair. EXAM: PORTABLE CHEST 1 VIEW COMPARISON:  Chest radiograph 11/05/2019 FINDINGS: Lung volumes are low.The cardiomediastinal contours are normal. Pulmonary vasculature is normal. No consolidation, pleural effusion, or pneumothorax. No acute osseous abnormalities are seen. IMPRESSION: Low lung volumes without acute findings. Electronically Signed   By: Narda Rutherford M.D.   On: 01/24/2024 15:12    Procedures Procedures    Medications Ordered in ED Medications  oxyCODONE-acetaminophen (PERCOCET/ROXICET) 5-325 MG per tablet 1 tablet (1 tablet Oral Given 01/24/24 1345)    ED Course/ Medical Decision Making/ A&P                                 Medical Decision Making This is a 67 year old  male with bilateral BKA, diabetes, hypertension hyperlipidemia CHF presenting emergency department after falling out of his wheelchair.  He is afebrile, nontachycardic, slightly hypertensive.  No overt injuries identified on exam.  He is complaining of headache, no focal deficits on exam.  Will get CT head and C-spine.  Will get chest x-ray to evaluate for intrathoracic and shoulder pathology.  He is moving upper extremities freely with full ROM.  Low suspicion for fracture or dislocation of the humerus.  Benign abdominal exam.  Seemingly most concerned about pain to his bilateral stumps.  Will get x-rays to evaluate for trauma.  Will forego labs at this time as I have low suspicion for acute intrathoracic or intra-abdominal pathology.  Patient notes that he does not have his wheelchair that EMS was unable to transported.  Will contact TOC regarding assistance.  See ED course for further MDM and  final disposition.  Care signed out to afternoon team. Dispo pending imaging.   Amount and/or Complexity of Data Reviewed External Data Reviewed:     Details: Per chart review he is post be taking antihypertensives, and torsemide Radiology: ordered.  Risk Prescription drug management. Decision regarding hospitalization.         Final Clinical Impression(s) / ED Diagnoses Final diagnoses:  Fall, initial encounter    Rx / DC Orders ED Discharge Orders     None         Coral Spikes, DO 01/24/24 1524

## 2024-01-24 NOTE — Progress Notes (Signed)
 CSW received consult for patient for possible home health or DME needs. Patient appears to be homeless. Patient is not active with [facility] and will not qualify for the [facility] ACO REACH waiver.   There are only two facilities accepting Traditional Medicare patient's without the [facility] waiver in place - [facility] and [facility]. If patient is admitted for 3 midnights, additional facilities may be be potential options for placement if needed.  Patient has not been seen by therapy staff yet and the medical workup appears to be ongoing.  TOC will continue to follow for discharge planning needs.  Edwin Dada, MSW, LCSW Transitions of Care  Clinical Social Worker II [phone]
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
## 2024-01-24 NOTE — Care Management (Signed)
 ED RNCM attempted to locate a wheelchair for discharge, unsuccessful. Contacted EMS to find out where patient's motorized wheelchair was located. According to the  EMS run-sheet patient who is a BKA [place] and brought to the ED at 2:30 am this morning after falling out of his wheelchair.  Spoke with patient who states, his wheelchair was left near [employer]. Attempted to contact [facility] no answer. Unable to safely discharge patient after hours. TOC will need to follow up in the am. Updated EDP and ED RN.

## 2024-01-25 NOTE — ED Provider Notes (Signed)
 Emergency Medicine Observation Re-evaluation Note  Scott Greer is a 68 y.o. male, seen on rounds today.  Pt initially presented to the ED for complaints of Fall Currently, the patient is awaiting social work assistance with finding wheelchair.  Physical Exam  BP (!) 178/88 (BP Location: Right Arm)   Pulse 88   Temp 98 F (36.7 C)   Resp 19   SpO2 100%  Physical Exam General: Calm Cardiac: Well perfused Lungs: Even respirations Psych: Calm  ED Course / MDM  EKG:   I have reviewed the labs performed to date as well as medications administered while in observation.  Recent changes in the last 24 hours include PD has found the patient's chair and are assisting with charging it. Patient stable for discharge.  Plan  Current plan is for discharge.    Maia Plan, MD 01/25/24 1155

## 2024-01-25 NOTE — Care Management (Addendum)
 Patient is boarding due to not being able to get his wheelchair. It is over near [facility]. Called [facility], however they do not have means to retrieve a electric wheelchair. Conferred with Social Work.  [phone] [facility] house, directed to shelter. They are not in the same place. Called [facility] back to see if they could go by the [facility] and see if the wheelchair is still there. They cannot do this.  [phone] Contacted PD, they will look and see if the wheelchair is still there.  1045 PD has graciously found the wheelchair. Located near the bus stop going toward [place]  on [address] . The electric chair needs to be charged.Message sent to nursing MD, patient will need to be discharged, then will call [facility] if provider gives the ok  1215 contacted [facility] to see if they would transport, however if his electric wheelchair is not charged then they do not feel comfortable dropping him off. Called [facility]OC leadership. Asked [facility] if they could pick up the wheelchair that is on the corner [address].  ( police have confirmed it is there) Again Due to liability, they could not pick up, if they had someone there that could verify, then they could. Called ex wife listed on chart. No answer or services with that phone. Spoke with patient at the bedside. He is trying to call some people as well. He has been staying at [employer], [facility] and had a hotel for a couple of months, but his money has now run out. He does not get paid until the first. He stopped taking his medications because they were making him feel sicker.  Messaged Cheri Rous about  no-one being able to pick up the wheelchair. As well as updated the team. Awaiting reply.  1400 Spoke to security to see if they could procure his electric wheelchair, they also say this is a liability  and they cannot do it.

## 2024-01-26 ENCOUNTER — Encounter (HOSPITAL_COMMUNITY): Payer: Self-pay

## 2024-01-26 NOTE — ED Notes (Signed)
 Pt has bags of "laundry" in room that are dirty and needs to be cleaned up. Patient is refusing to allow help for clean up and does not want anything touched. Patient did eat his breakfast and does not need anything at this time. Patient refused vital signs and this RN was told to come back later.

## 2024-01-26 NOTE — ED Provider Notes (Signed)
 Emergency Medicine Observation Re-evaluation Note  Scott Greer is a 68 y.o. male, seen on rounds today.  Pt initially presented to the ED for complaints of Fall Currently, the patient is awaiting transport/placement issues.  Physical Exam  BP (!) 154/92   Pulse 74   Temp 98.7 F (37.1 C)   Resp 17   SpO2 100%  Physical Exam General: Calm Cardiac: Well perfused Lungs: Even respirations  ED Course / MDM  EKG:   I have reviewed the labs performed to date as well as medications administered while in observation.  Recent changes in the last 24 hours include N/A.  Plan  Current plan is for awaiting wheelchair/transport. Case mgmt following.     Maia Plan, MD 01/26/24 [phone]

## 2024-01-26 NOTE — Care Management (Addendum)
 No New updates for patient at this time. Sent a message to oncoming shift tomorrow with information. Leadership aware that patient is still here.  1630 patients electric wheelchair was obtained by rehab from [facility]. Called [facility] they counter outsource with [employer] and mobility. They do not repair electric wheelchairs at [facility], would have to call [employer] at [phone]. Called them ( they have an office in [place]) and they are closed.  Will leave information in hand off. [employer] could not go get wheelchair. TOC leadership ( [facility]) aware.

## 2024-01-27 DIAGNOSIS — R519 Headache, unspecified: Secondary | ICD-10-CM | POA: Diagnosis not present

## 2024-01-27 MED ORDER — ATORVASTATIN CALCIUM 10 MG PO TABS
10.0000 mg | ORAL_TABLET | Freq: Every evening | ORAL | Status: DC
  Administered 2024-01-28 – 2024-01-29 (×2): 10 mg via ORAL
  Filled 2024-01-27 (×2): qty 1

## 2024-01-27 MED ORDER — GLIMEPIRIDE 2 MG PO TABS
2.0000 mg | ORAL_TABLET | Freq: Every day | ORAL | Status: DC
  Administered 2024-01-28 – 2024-01-30 (×3): 2 mg via ORAL
  Filled 2024-01-27 (×4): qty 1

## 2024-01-27 MED ORDER — GABAPENTIN 300 MG PO CAPS
300.0000 mg | ORAL_CAPSULE | Freq: Two times a day (BID) | ORAL | Status: DC
  Administered 2024-01-27 – 2024-01-29 (×4): 300 mg via ORAL
  Filled 2024-01-27 (×4): qty 1

## 2024-01-27 MED ORDER — ISOSORB DINITRATE-HYDRALAZINE 20-37.5 MG PO TABS
1.0000 | ORAL_TABLET | Freq: Two times a day (BID) | ORAL | Status: DC
  Administered 2024-01-27 – 2024-01-29 (×4): 1 via ORAL
  Filled 2024-01-27 (×5): qty 1

## 2024-01-27 MED ORDER — ASPIRIN 81 MG PO CHEW
81.0000 mg | CHEWABLE_TABLET | Freq: Every day | ORAL | Status: DC
Start: 2024-01-28 — End: 2024-01-30
  Administered 2024-01-28 – 2024-01-30 (×3): 81 mg via ORAL
  Filled 2024-01-27 (×3): qty 1

## 2024-01-27 MED ORDER — HYDROCODONE-ACETAMINOPHEN 5-325 MG PO TABS
1.0000 | ORAL_TABLET | Freq: Once | ORAL | Status: AC
  Administered 2024-01-27: 1 via ORAL
  Filled 2024-01-27: qty 1

## 2024-01-27 MED ORDER — FERROUS SULFATE 325 (65 FE) MG PO TABS
325.0000 mg | ORAL_TABLET | Freq: Every day | ORAL | Status: DC
  Administered 2024-01-28 – 2024-01-30 (×3): 325 mg via ORAL
  Filled 2024-01-27 (×3): qty 1

## 2024-01-27 MED ORDER — TORSEMIDE 20 MG PO TABS
10.0000 mg | ORAL_TABLET | Freq: Every day | ORAL | Status: DC
  Administered 2024-01-28 – 2024-01-30 (×3): 10 mg via ORAL
  Filled 2024-01-27 (×3): qty 1

## 2024-01-27 MED ORDER — EMPAGLIFLOZIN 10 MG PO TABS
10.0000 mg | ORAL_TABLET | Freq: Every day | ORAL | Status: DC
  Administered 2024-01-28 – 2024-01-29 (×2): 10 mg via ORAL
  Filled 2024-01-27 (×5): qty 1

## 2024-01-27 NOTE — ED Provider Notes (Signed)
 Emergency Medicine Observation Re-evaluation Note  Scott Greer is a 68 y.o. male, seen on rounds today.  Pt initially presented to the ED for complaints of Fall Currently, the patient is sleeping.  Physical Exam  BP (!) 165/94 (BP Location: Right Arm)   Pulse 85   Temp 98.4 F (36.9 C) (Oral)   Resp 20   SpO2 100%  Physical Exam General: NAD Cardiac: well perfused Lungs: no respiratory distress Psych: resting, calm  ED Course / MDM  EKG:   I have reviewed the labs performed to date as well as medications administered while in observation.  Recent changes in the last 24 hours include none.  Plan  Current plan is for social work evaluation.    Durwin Glaze, MD 01/27/24 [phone]

## 2024-01-27 NOTE — Progress Notes (Signed)
 CSW attempted to reach staff at [facility] and [facility] without success - a voicemail was left requesting a return call.  Edwin Dada, MSW, LCSW Transitions of Care  Clinical Social Worker II [phone]

## 2024-01-27 NOTE — Care Management (Signed)
 RN Case Manager attempted to contact [facility] and Mob[facility] [phone] as well, no answer LVM requesting a return call.

## 2024-01-28 DIAGNOSIS — R519 Headache, unspecified: Secondary | ICD-10-CM | POA: Diagnosis not present

## 2024-01-28 LAB — CBG MONITORING, ED: Glucose-Capillary: 108 mg/dL — ABNORMAL HIGH (ref 70–99)

## 2024-01-28 NOTE — ED Notes (Signed)
 Disregard departure conditions: charted by [facility], RN

## 2024-01-28 NOTE — ED Notes (Signed)
 Patient receive a shower and complete bed change

## 2024-01-28 NOTE — ED Provider Notes (Signed)
 Emergency Medicine Observation Re-evaluation Note  Scott Greer is a 68 y.o. male, seen on rounds today.  Pt initially presented to the ED for complaints of Fall Currently, the patient is eating breakfast.  Very talkative and pleasant.  Physical Exam  BP (!) 169/73 (BP Location: Left Arm)   Pulse 73   Temp 98.3 F (36.8 C) (Oral)   Resp 18   SpO2 99%  Physical Exam General: nad Cardiac: regular Lungs: clear msk: bilateral bka.  Skin is warm and no edema or wounds present  ED Course / MDM  EKG:   I have reviewed the labs performed to date as well as medications administered while in observation.  Recent changes in the last 24 hours include still looking for someone to repair wheelchair.  Plan  Current plan is for wheelchair and housing assistance.    Gwyneth Sprout, MD 01/28/24 [phone]

## 2024-01-29 DIAGNOSIS — R519 Headache, unspecified: Secondary | ICD-10-CM | POA: Diagnosis not present

## 2024-01-29 NOTE — ED Provider Notes (Signed)
 Emergency Medicine Observation Re-evaluation Note  Scott Greer is a 68 y.o. male, seen on rounds today.  Pt initially presented to the ED for complaints of Fall Currently, the patient is sitting upright calm.  Physical Exam  BP (!) 149/89 (BP Location: Right Arm)   Pulse 75   Temp 98.2 F (36.8 C) (Oral)   Resp 18   SpO2 100%  Physical Exam General: No distress Psych: Calm  ED Course / MDM  EKG:   I have reviewed the labs performed to date as well as medications administered while in observation.  Recent changes in the last 24 hours include ongoing efforts for placement.  Plan  Current plan is for placement.    Gerhard Munch, MD 01/29/24 1049

## 2024-01-30 ENCOUNTER — Emergency Department (HOSPITAL_COMMUNITY)
Admission: EM | Admit: 2024-01-30 | Discharge: 2024-01-31 | Disposition: A | Discharge status: Home / Self Care | Source: Home / Self Care | Attending: Emergency Medicine | Admitting: Emergency Medicine

## 2024-01-30 ENCOUNTER — Other Ambulatory Visit: Payer: Self-pay

## 2024-01-30 ENCOUNTER — Encounter (HOSPITAL_COMMUNITY): Payer: Self-pay | Admitting: Emergency Medicine

## 2024-01-30 DIAGNOSIS — Z993 Dependence on wheelchair: Secondary | ICD-10-CM | POA: Insufficient documentation

## 2024-01-30 DIAGNOSIS — Z5900 Homelessness unspecified: Secondary | ICD-10-CM | POA: Insufficient documentation

## 2024-01-30 DIAGNOSIS — R519 Headache, unspecified: Secondary | ICD-10-CM | POA: Diagnosis not present

## 2024-01-30 MED ORDER — ONDANSETRON HCL 4 MG PO TABS: 4.0000 mg | ORAL_TABLET | Freq: Three times a day (TID) | ORAL | Status: DC | PRN

## 2024-01-30 MED ORDER — NICOTINE 21 MG/24HR TD PT24
21.0000 mg | MEDICATED_PATCH | Freq: Every day | TRANSDERMAL | Status: DC
  Filled 2024-01-30: qty 1

## 2024-01-30 MED ORDER — ACETAMINOPHEN 325 MG PO TABS
650.0000 mg | ORAL_TABLET | ORAL | Status: DC | PRN
  Administered 2024-01-30 – 2024-01-31 (×2): 650 mg via ORAL
  Filled 2024-01-30 (×2): qty 2

## 2024-01-30 MED ORDER — ALUM & MAG HYDROXIDE-SIMETH 200-200-20 MG/5ML PO SUSP: 30.0000 mL | Freq: Four times a day (QID) | ORAL | Status: DC | PRN

## 2024-01-30 NOTE — Discharge Planning (Signed)
 Pt provided alternate number for sister, Eber Jones [phone]) and was able to reach her.  Eber Jones states that she can not help with this matter at this time.  RNCM located a used manual wheelchair for pt use.

## 2024-01-30 NOTE — Discharge Planning (Signed)
 TOC continues to work on obtaining pt wheelchair from community last seen on [employer] going toward [employer] (per [employer]).  Pt informed EDP that his sister may be able to help.  RNCM obtained number from pt and it was the incorrect number.  TOC will continue to reach out to  [employer] and [employer] [phone].  Atticus Lemberger J. Lucretia Roers, RN, BSN, NCM  Transitions of Care  Nurse Case Manager  [facility] Emergency Departments  Operative Services  [phone]

## 2024-01-30 NOTE — ED Provider Notes (Signed)
 Emergency Medicine Observation Re-evaluation Note  Scott Greer is a 68 y.o. male, seen on rounds today.  Pt initially presented to the ED for complaints of Fall Currently, the patient is eating breakfast. No complaints   Physical Exam  BP (!) 151/82 (BP Location: Right Arm)   Pulse 75   Temp 98 F (36.7 C) (Oral)   Resp 18   SpO2 100%  Physical Exam General: NAD Cardiac: RR Lungs: non labored  Psych: Calm   ED Course / MDM  EKG:   I have reviewed the labs performed to date as well as medications administered while in observation.  Recent changes in the last 24 hours include none.  Plan  Current plan is for wheelchair; difficulty getting home electric wheelchair. TOC working on solutions.     Coral Spikes, DO 01/30/24 [phone]
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
## 2024-01-30 NOTE — ED Triage Notes (Signed)
 Pt reports "flipping wheelchair" and falling x 1 week ago. Bilateral BKA noted. Pt states he does not have a place to stay and had someone helping him out to pick up his electric wheelchair where he left it. Pt states he was unable to find his wheelchair where he left it and was brought here to get assistance.
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
## 2024-01-30 NOTE — Discharge Instructions (Addendum)
 Please follow-up with your primary doctor.  Return if you develop fevers, chills, chest pain, shortness of breath or any new or worsening symptoms that are concerning to you.

## 2024-01-30 NOTE — ED Notes (Signed)
 Pts knife given to security for safety precautions.

## 2024-01-30 NOTE — ED Notes (Signed)
 Pt placed on hospital bed for comfort, provided with snacks at this time.

## 2024-01-30 NOTE — ED Notes (Signed)
 Report given to [facility].

## 2024-01-31 DIAGNOSIS — R519 Headache, unspecified: Secondary | ICD-10-CM | POA: Diagnosis not present

## 2024-01-31 MED ORDER — GABAPENTIN 300 MG PO CAPS: 300.0000 mg | ORAL_CAPSULE | Freq: Two times a day (BID) | ORAL | Status: DC

## 2024-01-31 MED ORDER — FERROUS SULFATE 325 (65 FE) MG PO TABS
325.0000 mg | ORAL_TABLET | Freq: Every day | ORAL | Status: DC
  Administered 2024-01-31: 325 mg via ORAL
  Filled 2024-01-31: qty 1

## 2024-01-31 MED ORDER — TORSEMIDE 20 MG PO TABS
10.0000 mg | ORAL_TABLET | Freq: Every day | ORAL | Status: DC
Start: 2024-01-31 — End: 2024-01-31
  Administered 2024-01-31: 10 mg via ORAL
  Filled 2024-01-31: qty 1

## 2024-01-31 MED ORDER — GLIMEPIRIDE 2 MG PO TABS
2.0000 mg | ORAL_TABLET | Freq: Every day | ORAL | Status: DC
  Administered 2024-01-31: 2 mg via ORAL
  Filled 2024-01-31: qty 1

## 2024-01-31 MED ORDER — ASPIRIN 81 MG PO CHEW
81.0000 mg | CHEWABLE_TABLET | Freq: Every day | ORAL | Status: DC
Start: 2024-01-31 — End: 2024-01-31
  Administered 2024-01-31: 81 mg via ORAL
  Filled 2024-01-31: qty 1

## 2024-01-31 MED ORDER — ATORVASTATIN CALCIUM 10 MG PO TABS: 10.0000 mg | ORAL_TABLET | Freq: Every evening | ORAL | Status: DC

## 2024-01-31 MED ORDER — ISOSORB DINITRATE-HYDRALAZINE 20-37.5 MG PO TABS: 1.0000 | ORAL_TABLET | Freq: Two times a day (BID) | ORAL | Status: DC

## 2024-01-31 MED ORDER — EMPAGLIFLOZIN 10 MG PO TABS
10.0000 mg | ORAL_TABLET | Freq: Every day | ORAL | Status: DC
  Administered 2024-01-31: 10 mg via ORAL
  Filled 2024-01-31: qty 1

## 2024-01-31 NOTE — ED Provider Notes (Addendum)
 Emergency Medicine Observation Re-evaluation Note  Scott Greer is a 68 y.o. male, seen on rounds today.  Pt initially presented to the ED for complaints of Case Management Consult Currently, the patient is awake eating breakfast, no concerns by nursing staff.  Physical Exam  BP (!) 151/66 (BP Location: Right Arm)   Pulse 84   Temp 98 F (36.7 C) (Oral)   Resp 15   SpO2 100%  Physical Exam General: Awake and alert, no acute distress Cardiac: Regular rate Lungs: No increased work of breathing Psych: Calm, cooperative  ED Course / MDM  EKG:   I have reviewed the labs performed to date as well as medications administered while in observation.  Recent changes in the last 24 hours include patient remains medically cleared. TOC was consulted to assist in helping him obtain a new electric wheelchair.  Plan  Current plan is for [facility] for new wheelchair prior to dispo.  12:22 PM CM unfortunately unable to provider another electric wheelchair and only able to offer manual. Patient updated. He is stable for discharge home.    Elayne Snare K, DO 01/31/24 1223

## 2024-01-31 NOTE — ED Notes (Signed)
Case management to bedside

## 2024-01-31 NOTE — Discharge Instructions (Signed)
 You were seen in the emergency department after losing her electric wheelchair.  We are unfortunately unable to get you another electric wheelchair and only able to get you a manual one.  You can follow-up with your primary doctor or with outpatient social work for additional assistance and supplies.  You can return to the emergency department for any new or concerning symptoms.

## 2024-01-31 NOTE — Discharge Planning (Signed)
 TOC attempted to locate pt motorized wheelchair without success.  RNCM obtained permission from leadership to provide manual wheelchair utilizing [employer] funds.  Wheelchair ordered through [employer] and will be delivered to pt at bedside.  Sherrey North J. Lucretia Roers, RN, BSN, NCM  Transitions of Care  Nurse Case Manager  [facility] Emergency Departments  Operative Services  [phone]

## 2024-02-01 ENCOUNTER — Encounter (HOSPITAL_COMMUNITY): Payer: Self-pay

## 2024-02-01 ENCOUNTER — Emergency Department (HOSPITAL_COMMUNITY)
Admission: EM | Admit: 2024-02-01 | Discharge: 2024-02-01 | Disposition: A | Discharge status: Home / Self Care | Attending: Emergency Medicine | Admitting: Emergency Medicine

## 2024-02-01 ENCOUNTER — Other Ambulatory Visit: Payer: Self-pay

## 2024-02-01 DIAGNOSIS — M79643 Pain in unspecified hand: Secondary | ICD-10-CM | POA: Diagnosis present

## 2024-02-01 DIAGNOSIS — Z5900 Homelessness unspecified: Secondary | ICD-10-CM | POA: Insufficient documentation

## 2024-02-01 DIAGNOSIS — Z993 Dependence on wheelchair: Secondary | ICD-10-CM | POA: Diagnosis not present

## 2024-02-01 DIAGNOSIS — Z89511 Acquired absence of right leg below knee: Secondary | ICD-10-CM | POA: Insufficient documentation

## 2024-02-01 DIAGNOSIS — Z89512 Acquired absence of left leg below knee: Secondary | ICD-10-CM | POA: Diagnosis not present

## 2024-02-01 DIAGNOSIS — Z7982 Long term (current) use of aspirin: Secondary | ICD-10-CM | POA: Diagnosis not present

## 2024-02-01 MED ORDER — ACETAMINOPHEN 500 MG PO TABS
1000.0000 mg | ORAL_TABLET | Freq: Once | ORAL | Status: AC
  Administered 2024-02-01: 1000 mg via ORAL
  Filled 2024-02-01: qty 2

## 2024-02-01 MED ORDER — ACETAMINOPHEN 500 MG PO TABS
ORAL_TABLET | ORAL | Status: AC
  Filled 2024-02-01: qty 1

## 2024-02-01 NOTE — ED Triage Notes (Signed)
 Pt c.o bilateral hand pain from wheeling around in his manual WC he received yesterday. Pt also c.o continued leg pain from fall out of his electric wheelchair a week ago

## 2024-02-01 NOTE — ED Provider Notes (Signed)
 [place] EMERGENCY DEPARTMENT AT [facility] Provider Note   CSN: [id number] Arrival date & time: 02/01/24  1627     History {Add pertinent medical, surgical, social history, OB history to HPI:1} Chief Complaint  Patient presents with   Hand Pain    Scott Greer is a 67 y.o. male.  68 year old male with prior medical history as detailed below presents for evaluation.  Patient is homeless.  He is currently wheelchair-bound status post bilateral BKA amputation.  He just left our facility yesterday.  Apparently he said that he could not find a shelter spot.  He slept in his chair  wheelchair overnight on the street.  He has no acute complaint today.  He reports persistent soreness to both amputation stumps after fall last week.  Workup of the fall did not reveal significant traumatic injury.    The history is provided by the patient.       Home Medications Prior to Admission medications   Medication Sig Start Date End Date Taking? Authorizing Provider  aspirin (ASPIRIN LOW DOSE) 81 MG chewable tablet CHEW ONE TABLET BY MOUTH ONCE DAILY Needs appointment for further refills Patient not taking: Reported on 11/10/2023 10/25/22   [name], PA-C  atorvastatin (LIPITOR) 10 MG tablet Take 1 tablet (10 mg total) by mouth every evening. Patient not taking: Reported on 11/10/2023 12/14/22   [name], MD  empagliflozin (JARDIANCE) 10 MG TABS tablet Take 1 tablet (10 mg total) by mouth daily at 12 noon. Patient not taking: Reported on 11/10/2023 12/14/22   [name], MD  ferrous sulfate (FEROSUL) 325 (65 FE) MG tablet TAKE ONE TABLET BY MOUTH EVERY MORNING WITH BREAKFAST Patient not taking: Reported on 11/10/2023 08/15/22   [name], MD  gabapentin (NEURONTIN) 300 MG capsule Take 1 capsule (300 mg total) by mouth 2 (two) times daily. Patient not taking: Reported on 11/10/2023 01/17/23   [name], MD  glimepiride (AMARYL) 1 MG tablet Take 2  tablets (2 mg total) by mouth daily with breakfast. Patient not taking: Reported on 11/10/2023 04/17/23   [name], PA-C  isosorbide-hydrALAZINE (BIDIL) 20-37.5 MG tablet Take 1 tablet by mouth 2 (two) times daily. Patient not taking: Reported on 11/10/2023 12/14/22   [name], MD  torsemide (DEMADEX) 10 MG tablet Take 1 tablet (10 mg total) by mouth daily. Patient not taking: Reported on 11/10/2023 04/10/23   Burnadette Pop, MD      Allergies    Bee venom, Metformin, and Oatmeal    Review of Systems   Review of Systems  All other systems reviewed and are negative.   Physical Exam Updated Vital Signs BP (!) 142/83 (BP Location: Left Arm)   Pulse 92   Temp 97.8 F (36.6 C)   Resp 18   SpO2 100%  Physical Exam Vitals and nursing note reviewed.  Constitutional:      General: He is not in acute distress.    Appearance: Normal appearance. He is well-developed.  HENT:     Head: Normocephalic and atraumatic.  Eyes:     Conjunctiva/sclera: Conjunctivae normal.     Pupils: Pupils are equal, round, and reactive to light.  Cardiovascular:     Rate and Rhythm: Normal rate and regular rhythm.     Heart sounds: Normal heart sounds.  Pulmonary:     Effort: Pulmonary effort is normal. No respiratory distress.     Breath sounds: Normal breath sounds.  Abdominal:  General: There is no distension.     Palpations: Abdomen is soft.     Tenderness: There is no abdominal tenderness.  Musculoskeletal:        General: No deformity. Normal range of motion.     Cervical back: Normal range of motion and neck supple.     Comments: SP Bilateral BKA   Skin:    General: Skin is warm and dry.  Neurological:     General: No focal deficit present.     Mental Status: He is alert and oriented to person, place, and time.     ED Results / Procedures / Treatments   Labs (all labs ordered are listed, but only abnormal results are displayed) Labs Reviewed - No data to  display  EKG None  Radiology No results found.  Procedures Procedures  {Document cardiac monitor, telemetry assessment procedure when appropriate:1}  Medications Ordered in ED Medications  acetaminophen (TYLENOL) tablet 1,000 mg (has no administration in time range)    ED Course/ Medical Decision Making/ A&P   {   Click here for ABCD2, HEART and other calculatorsREFRESH Note before signing :1}                              Medical Decision Making Risk OTC drugs.    Medical Screen Complete  This patient presented to the ED with complaint of ***.  This complaint involves an extensive number of treatment options. The initial differential diagnosis includes, but is not limited to, ***  This presentation is: {IllnessRisk:19196::"***","Acute","Chronic","Self-Limited","Previously Undiagnosed","Uncertain Prognosis","Complicated","Systemic Symptoms","Threat to Life/Bodily Function"}    Co morbidities that complicated the patient's evaluation  ***   Additional history obtained:  Additional history obtained from {History source:19196::"EMS","Spouse","Family","Friend","Caregiver"} External records from outside sources obtained and reviewed including prior ED visits and prior Inpatient records.    Lab Tests:  I ordered and personally interpreted labs.  The pertinent results include:  ***   Imaging Studies ordered:  I ordered imaging studies including ***  I independently visualized and interpreted obtained imaging which showed *** I agree with the radiologist interpretation.   Cardiac Monitoring:  The patient was maintained on a cardiac monitor.  I personally viewed and interpreted the cardiac monitor which showed an underlying rhythm of: ***   Medicines ordered:  I ordered medication including ***  for ***  Reevaluation of the patient after these medicines showed that the patient: {resolved/improved/worsened:23923::"improved"}    Test  Considered:  ***   Critical Interventions:  ***   Consultations Obtained:  I consulted ***,  and discussed lab and imaging findings as well as pertinent plan of care.    Problem List / ED Course:  ***   Reevaluation:  After the interventions noted above, I reevaluated the patient and found that they have: {resolved/improved/worsened:23923::"improved"}   Social Determinants of Health:  ***   Disposition:  After consideration of the diagnostic results and the patients response to treatment, I feel that the patent would benefit from ***.    {Document critical care time when appropriate:1} {Document review of labs and clinical decision tools ie heart score, Chads2Vasc2 etc:1}  {Document your independent review of radiology images, and any outside records:1} {Document your discussion with family members, caretakers, and with consultants:1} {Document social determinants of health affecting pt's care:1} {Document your decision making why or why not admission, treatments were needed:1} Final Clinical Impression(s) / ED Diagnoses Final diagnoses:  Homelessness    Rx / DC  Orders ED Discharge Orders     None

## 2024-02-01 NOTE — Discharge Instructions (Signed)
 Return for any problem.  ?
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
## 2024-03-04 ENCOUNTER — Emergency Department (HOSPITAL_COMMUNITY)
Admission: EM | Admit: 2024-03-04 | Discharge: 2024-03-06 | Disposition: A | Discharge status: Home / Self Care | Attending: Emergency Medicine | Admitting: Emergency Medicine

## 2024-03-04 ENCOUNTER — Encounter (HOSPITAL_COMMUNITY): Payer: Self-pay | Admitting: *Deleted

## 2024-03-04 ENCOUNTER — Other Ambulatory Visit: Payer: Self-pay

## 2024-03-04 DIAGNOSIS — Z7984 Long term (current) use of oral hypoglycemic drugs: Secondary | ICD-10-CM | POA: Insufficient documentation

## 2024-03-04 DIAGNOSIS — Z5900 Homelessness unspecified: Secondary | ICD-10-CM | POA: Insufficient documentation

## 2024-03-04 DIAGNOSIS — R600 Localized edema: Secondary | ICD-10-CM | POA: Diagnosis not present

## 2024-03-04 DIAGNOSIS — M25561 Pain in right knee: Secondary | ICD-10-CM | POA: Diagnosis present

## 2024-03-04 DIAGNOSIS — Z7982 Long term (current) use of aspirin: Secondary | ICD-10-CM | POA: Insufficient documentation

## 2024-03-04 DIAGNOSIS — Z79899 Other long term (current) drug therapy: Secondary | ICD-10-CM | POA: Diagnosis not present

## 2024-03-04 DIAGNOSIS — E119 Type 2 diabetes mellitus without complications: Secondary | ICD-10-CM | POA: Insufficient documentation

## 2024-03-04 DIAGNOSIS — I110 Hypertensive heart disease with heart failure: Secondary | ICD-10-CM | POA: Diagnosis not present

## 2024-03-04 DIAGNOSIS — I509 Heart failure, unspecified: Secondary | ICD-10-CM | POA: Diagnosis not present

## 2024-03-04 DIAGNOSIS — W19XXXA Unspecified fall, initial encounter: Secondary | ICD-10-CM | POA: Insufficient documentation

## 2024-03-04 DIAGNOSIS — S8011XA Contusion of right lower leg, initial encounter: Secondary | ICD-10-CM | POA: Diagnosis not present

## 2024-03-04 DIAGNOSIS — S8012XA Contusion of left lower leg, initial encounter: Secondary | ICD-10-CM | POA: Diagnosis not present
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
## 2024-03-05 ENCOUNTER — Emergency Department (HOSPITAL_COMMUNITY)

## 2024-03-05 DIAGNOSIS — S8011XA Contusion of right lower leg, initial encounter: Secondary | ICD-10-CM | POA: Diagnosis not present

## 2024-03-05 MED ORDER — IBUPROFEN 400 MG PO TABS
600.0000 mg | ORAL_TABLET | Freq: Once | ORAL | Status: AC
  Administered 2024-03-05: 600 mg via ORAL
  Filled 2024-03-05: qty 1

## 2024-03-05 MED ORDER — FUROSEMIDE 20 MG PO TABS
40.0000 mg | ORAL_TABLET | Freq: Once | ORAL | Status: AC
  Administered 2024-03-05: 40 mg via ORAL
  Filled 2024-03-05: qty 2

## 2024-03-05 MED ORDER — ACETAMINOPHEN 325 MG PO TABS
650.0000 mg | ORAL_TABLET | Freq: Once | ORAL | Status: AC
  Administered 2024-03-05: 650 mg via ORAL
  Filled 2024-03-05: qty 2

## 2024-03-05 NOTE — Discharge Planning (Signed)
 RNCM contacted staff at [facility] regarding pt wheelchair.  Staff states pt wheelchair is not on their premises at this time.  RNCM will speak with leadership regarding options to obtain a wheelchair for pt. Autumne Kallio J. Rachel Budds, RN, BSN, NCM  Transitions of Care  Nurse Case Manager  [facility] Emergency Departments  Operative Services  [phone]
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
## 2024-03-05 NOTE — Progress Notes (Addendum)
 3:10pm: CSW spoke with Aron Lard, RN CM who states she spoke with Aleta Anda and Cornelius Dill [facility]) who both state TOC will not be purchasing patient another wheelchair.  8am: CSW received consult for patient for possible SNF placement.  Patient was given a manual wheelchair on 01/31/24 by TOC utilizing an LOG. Patient is not eligible to receive another wheelchair.  Patient cannot be placed in SNF due to his insurance being traditional Medicare that requires a 3 midnight inpatient stay to qualify.  Patient is homeless and is aware of local temporary housing options.  Shepard Dicker, MSW, LCSW Transitions of Care  Clinical Social Worker II [phone]

## 2024-03-05 NOTE — ED Provider Notes (Signed)
  Physical Exam  BP (!) 160/91 (BP Location: Right Arm)   Pulse 96   Temp 98.4 F (36.9 C) (Oral)   Resp 15   Ht 6\' 3"  (1.905 m)   Wt 93.1 kg   SpO2 100%   BMI 25.65 kg/m   Physical Exam  Procedures  Procedures  ED Course / MDM   Clinical Course as of 03/05/24 0848  Thu Mar 05, 2024  0815 Assumed care from Dr Candelaria Chaco. 68 yo M with hx of BL amputations who is wheelchair-bound.  Came in because his wheelchair was not working and he fell out of his wheelchair.  TOC is evaluating the patient now. [RP]  0830 Shepard Dicker from [facility] is following.  Cannot place the patient in SNF unless he is admitted for 3 night stay.  They state that the patient was just given a manual wheelchair 5 weeks ago.  Patient says tha the says the manual wheelchair is at the [facility] the shelter at [address]  Believes there is someone named Ernestina Headland that should know where it is.  I attempted to call them and no one picked up. [RP]  0848 Placed in TOC boarder status until we are able to find a safe disposition for the patient [RP]    Clinical Course User Index [RP] Ninetta Basket, MD   Medical Decision Making Amount and/or Complexity of Data Reviewed Radiology: ordered.  Risk OTC drugs. Prescription drug management.      Ninetta Basket, MD 03/05/24 [phone]

## 2024-03-06 DIAGNOSIS — S8011XA Contusion of right lower leg, initial encounter: Secondary | ICD-10-CM | POA: Diagnosis not present

## 2024-03-06 MED ORDER — IBUPROFEN 400 MG PO TABS
600.0000 mg | ORAL_TABLET | Freq: Once | ORAL | Status: AC
  Administered 2024-03-06: 600 mg via ORAL
  Filled 2024-03-06: qty 1

## 2024-03-06 MED ORDER — ACETAMINOPHEN 500 MG PO TABS
1000.0000 mg | ORAL_TABLET | Freq: Once | ORAL | Status: AC
  Administered 2024-03-06: 1000 mg via ORAL
  Filled 2024-03-06: qty 2

## 2024-03-06 NOTE — Discharge Instructions (Signed)
 Continue Tylenol  and ibuprofen  for pain at home.  Recommend ice.

## 2024-03-06 NOTE — ED Notes (Signed)
 Pt friend brought electric w/c to his room.
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
## 2024-03-06 NOTE — ED Provider Notes (Addendum)
 Emergency Medicine Observation Re-evaluation Note  Scott Greer is a 68 y.o. male, seen on rounds today.  Pt initially presented to the ED for complaints of Knee Pain Currently, the patient is ok.  Physical Exam  BP (!) 166/89 (BP Location: Right Arm)   Pulse 73   Temp 97.6 F (36.4 C) (Oral)   Resp 16   Ht 6\' 3"  (1.905 m)   Wt 93.1 kg   SpO2 100%   BMI 25.65 kg/m  Physical Exam General: awake   ED Course / MDM  EKG:   I have reviewed the labs performed to date as well as medications administered while in observation.  Recent changes in the last 24 hours include nothing.  Plan  Current plan is for [facility] plan for d/c or placement.  Patient got electric wheelchair.  Discharge.  Lowery Rue, DO 03/06/24 1610    Lowery Rue, DO 03/06/24 [phone]
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
## 2024-03-06 NOTE — ED Notes (Signed)
 Pt given basin filled with warm water  and rags. Pt also given soap, toothbrush and tooth paste and deodorant. Pt sat up in bed. Call light within reach. No other needs voiced at this time from pt.

## 2024-03-16 ENCOUNTER — Emergency Department (HOSPITAL_COMMUNITY)
Admission: EM | Admit: 2024-03-16 | Discharge: 2024-03-17 | Disposition: A | Discharge status: Home / Self Care | Attending: Emergency Medicine | Admitting: Emergency Medicine

## 2024-03-16 ENCOUNTER — Emergency Department (HOSPITAL_COMMUNITY)

## 2024-03-16 ENCOUNTER — Other Ambulatory Visit: Payer: Self-pay

## 2024-03-16 ENCOUNTER — Encounter (HOSPITAL_COMMUNITY): Payer: Self-pay | Admitting: *Deleted

## 2024-03-16 DIAGNOSIS — R5383 Other fatigue: Secondary | ICD-10-CM | POA: Insufficient documentation

## 2024-03-16 DIAGNOSIS — M79604 Pain in right leg: Secondary | ICD-10-CM | POA: Diagnosis not present

## 2024-03-16 DIAGNOSIS — Z5900 Homelessness unspecified: Secondary | ICD-10-CM | POA: Diagnosis not present

## 2024-03-16 DIAGNOSIS — I509 Heart failure, unspecified: Secondary | ICD-10-CM | POA: Diagnosis not present

## 2024-03-16 DIAGNOSIS — Z7984 Long term (current) use of oral hypoglycemic drugs: Secondary | ICD-10-CM | POA: Insufficient documentation

## 2024-03-16 DIAGNOSIS — E1122 Type 2 diabetes mellitus with diabetic chronic kidney disease: Secondary | ICD-10-CM | POA: Diagnosis not present

## 2024-03-16 DIAGNOSIS — I130 Hypertensive heart and chronic kidney disease with heart failure and stage 1 through stage 4 chronic kidney disease, or unspecified chronic kidney disease: Secondary | ICD-10-CM | POA: Insufficient documentation

## 2024-03-16 DIAGNOSIS — Z79899 Other long term (current) drug therapy: Secondary | ICD-10-CM | POA: Insufficient documentation

## 2024-03-16 DIAGNOSIS — N189 Chronic kidney disease, unspecified: Secondary | ICD-10-CM | POA: Insufficient documentation

## 2024-03-16 DIAGNOSIS — R4182 Altered mental status, unspecified: Secondary | ICD-10-CM | POA: Diagnosis not present

## 2024-03-16 DIAGNOSIS — M79606 Pain in leg, unspecified: Secondary | ICD-10-CM | POA: Diagnosis present

## 2024-03-16 DIAGNOSIS — M79605 Pain in left leg: Secondary | ICD-10-CM | POA: Insufficient documentation

## 2024-03-16 LAB — RAPID URINE DRUG SCREEN, HOSP PERFORMED
Amphetamines: NOT DETECTED
Barbiturates: NOT DETECTED
Benzodiazepines: NOT DETECTED
Cocaine: POSITIVE — AB
Opiates: NOT DETECTED
Tetrahydrocannabinol: POSITIVE — AB

## 2024-03-16 LAB — CBC WITH DIFFERENTIAL/PLATELET
Abs Immature Granulocytes: 0.04 10*3/uL (ref 0.00–0.07)
Basophils Absolute: 0 10*3/uL (ref 0.0–0.1)
Basophils Relative: 0 %
Eosinophils Absolute: 0.2 10*3/uL (ref 0.0–0.5)
Eosinophils Relative: 2 %
HCT: 33.3 % — ABNORMAL LOW (ref 39.0–52.0)
Hemoglobin: 10.4 g/dL — ABNORMAL LOW (ref 13.0–17.0)
Immature Granulocytes: 1 %
Lymphocytes Relative: 17 %
Lymphs Abs: 1.5 10*3/uL (ref 0.7–4.0)
MCH: 27.5 pg (ref 26.0–34.0)
MCHC: 31.2 g/dL (ref 30.0–36.0)
MCV: 88.1 fL (ref 80.0–100.0)
Monocytes Absolute: 0.7 10*3/uL (ref 0.1–1.0)
Monocytes Relative: 8 %
Neutro Abs: 6.1 10*3/uL (ref 1.7–7.7)
Neutrophils Relative %: 72 %
Platelets: 312 10*3/uL (ref 150–400)
RBC: 3.78 MIL/uL — ABNORMAL LOW (ref 4.22–5.81)
RDW: 16.2 % — ABNORMAL HIGH (ref 11.5–15.5)
WBC: 8.5 10*3/uL (ref 4.0–10.5)
nRBC: 0 % (ref 0.0–0.2)

## 2024-03-16 LAB — URINALYSIS, ROUTINE W REFLEX MICROSCOPIC
Bacteria, UA: NONE SEEN
Bilirubin Urine: NEGATIVE
Glucose, UA: 50 mg/dL — AB
Hgb urine dipstick: NEGATIVE
Ketones, ur: NEGATIVE mg/dL
Leukocytes,Ua: NEGATIVE
Nitrite: NEGATIVE
Protein, ur: >=300 mg/dL — AB
Specific Gravity, Urine: 1.015 (ref 1.005–1.030)
pH: 6 (ref 5.0–8.0)

## 2024-03-16 LAB — COMPREHENSIVE METABOLIC PANEL WITH GFR
ALT: 11 U/L (ref 0–44)
AST: 12 U/L — ABNORMAL LOW (ref 15–41)
Albumin: 3.9 g/dL (ref 3.5–5.0)
Alkaline Phosphatase: 43 U/L (ref 38–126)
Anion gap: 7 (ref 5–15)
BUN: 28 mg/dL — ABNORMAL HIGH (ref 8–23)
CO2: 25 mmol/L (ref 22–32)
Calcium: 9 mg/dL (ref 8.9–10.3)
Chloride: 105 mmol/L (ref 98–111)
Creatinine, Ser: 1.73 mg/dL — ABNORMAL HIGH (ref 0.61–1.24)
GFR, Estimated: 43 mL/min — ABNORMAL LOW (ref >60)
Glucose, Bld: 154 mg/dL — ABNORMAL HIGH (ref 70–99)
Potassium: 4 mmol/L (ref 3.5–5.1)
Sodium: 137 mmol/L (ref 135–145)
Total Bilirubin: 0.8 mg/dL (ref 0.0–1.2)
Total Protein: 7 g/dL (ref 6.5–8.1)

## 2024-03-16 LAB — CBG MONITORING, ED: Glucose-Capillary: 157 mg/dL — ABNORMAL HIGH (ref 70–99)

## 2024-03-16 LAB — ETHANOL: Alcohol, Ethyl (B): <15 mg/dL (ref <15)

## 2024-03-16 LAB — MAGNESIUM: Magnesium: 2.2 mg/dL (ref 1.7–2.4)

## 2024-03-16 MED ORDER — ISOSORB DINITRATE-HYDRALAZINE 20-37.5 MG PO TABS
1.0000 | ORAL_TABLET | Freq: Two times a day (BID) | ORAL | Status: DC
  Administered 2024-03-16 – 2024-03-17 (×2): 1 via ORAL
  Filled 2024-03-16 (×5): qty 1

## 2024-03-16 MED ORDER — ACETAMINOPHEN 500 MG PO TABS
1000.0000 mg | ORAL_TABLET | Freq: Once | ORAL | Status: AC
  Administered 2024-03-16: 1000 mg via ORAL
  Filled 2024-03-16: qty 2

## 2024-03-16 MED ORDER — GABAPENTIN 300 MG PO CAPS
300.0000 mg | ORAL_CAPSULE | Freq: Two times a day (BID) | ORAL | Status: DC
  Administered 2024-03-16 – 2024-03-17 (×3): 300 mg via ORAL
  Filled 2024-03-16 (×3): qty 1

## 2024-03-16 NOTE — ED Notes (Signed)
 [facility] called for transport.

## 2024-03-16 NOTE — ED Notes (Signed)
 PTAR stating originally patient called due to the wheelchair being dead, so unable to transport back due to patient not having a way to get around.
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
## 2024-03-16 NOTE — ED Notes (Signed)
 Continuing to try and obtain information regarding transport back to w/c.
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
## 2024-03-16 NOTE — Discharge Instructions (Addendum)
 Return for any problem.  ?
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
## 2024-03-16 NOTE — ED Notes (Signed)
 Office updated, there is no w/c at location the pt has provided when checked by D-2.

## 2024-03-16 NOTE — Care Management (Addendum)
---  Late Documentation---  -2:12pm  This RNCM left voicemail for [facility] leadership awaiting a call back regarding assisting patient with obtaining his motorized wheelchair.  Charge RN notified TOC that patient is in lobby due to need for beds.  -4:32pm Left another voicemail for [facility] leadership re: wheelchair  Location and repair. Awaiting a response. Notified Consulting civil engineer.  -5:24p This RNCM called [employer] non emergency line and was advised they are unable to assist. [employer] transferred this RNCM to EMS supervisor who is unable to assist with picking the motorized wheelchair up to transport. EMS supervisor referred me back to [employer]. Awaiting response from [facility] Director.
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
## 2024-03-16 NOTE — ED Notes (Signed)
 Pt in bed with eyes closed emitting snoring like noise, pt awaits toc to retreive his wheel chair.

## 2024-03-16 NOTE — ED Triage Notes (Signed)
 Pt says that he doesn't know why they called EMS, but he is having pain in both of his legs at the amputation site, has not taken any medication for the pain- was given tylenol  prior for the same but that does not help him. No signs of wounds or trauma to the sites. He falls asleep, but is arouses to voices.

## 2024-03-16 NOTE — Discharge Planning (Addendum)
 RNCM reached out to DME wheelchair company regarding Geophysicist/field seismologist. [employer] provided number for [employer] [phone]) and [employer] [phone]) for additional assistance.  Leshawn Straka J. Rachel Budds, RN, BSN, NCM  Transitions of Care  Nurse Case Manager  [facility] Emergency Departments  Operative Services  [phone]

## 2024-03-16 NOTE — Discharge Planning (Signed)
 RNCM was unsuccessful in obtaining portable wheelchair and extra battery for pt motorized wheelchair. TOC will continue to work with leadership tp find solution.  Margery Szostak J. Rachel Budds, RN, BSN, NCM  Transitions of Care  Nurse Case Manager  [facility] Emergency Departments  Operative Services  [phone]

## 2024-03-16 NOTE — ED Notes (Signed)
 PT in bed sleeping; steady, unlabored breathing

## 2024-03-16 NOTE — ED Notes (Signed)
 TOC has not been able to obtain a battery for pts w/c. Writer has spoken with [employer] to see if someone can check and make sure chair is where he left it. If so, is it at a physical address. Still waiting on updates.
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
## 2024-03-16 NOTE — ED Provider Notes (Signed)
 [place] EMERGENCY DEPARTMENT AT [facility] Provider Note   CSN: [id number] Arrival date & time: 03/16/24  4098     History  Chief Complaint  Patient presents with   Leg Pain    Scott Greer is a 68 y.o. male.   Leg Pain Associated symptoms: fatigue   Patient presents after a bystander saw him laying on the ground outside and called EMS.  Medical history includes HTN, DM, HLD, anemia, CKD, CHF, bilateral BKA's, homelessness.  Patient states that he has ongoing pain in his stumps.  He denies any other areas of discomfort.  He states that he feels tired.     Home Medications Prior to Admission medications   Medication Sig Start Date End Date Taking? Authorizing Provider  aspirin  (ASPIRIN  LOW DOSE) 81 MG chewable tablet CHEW ONE TABLET BY MOUTH ONCE DAILY Needs appointment for further refills Patient not taking: Reported on 11/10/2023 10/25/22   Hassie Lint, PA-C  atorvastatin  (LIPITOR) 10 MG tablet Take 1 tablet (10 mg total) by mouth every evening. Patient not taking: Reported on 11/10/2023 12/14/22   Lawrance Presume, MD  empagliflozin  (JARDIANCE ) 10 MG TABS tablet Take 1 tablet (10 mg total) by mouth daily at 12 noon. Patient not taking: Reported on 11/10/2023 12/14/22   Lawrance Presume, MD  ferrous sulfate  (FEROSUL) 325 (65 FE) MG tablet TAKE ONE TABLET BY MOUTH EVERY MORNING WITH BREAKFAST Patient not taking: Reported on 11/10/2023 08/15/22   Lawrance Presume, MD  gabapentin  (NEURONTIN ) 300 MG capsule Take 1 capsule (300 mg total) by mouth 2 (two) times daily. Patient not taking: Reported on 11/10/2023 01/17/23   Lawrance Presume, MD  glimepiride  (AMARYL ) 1 MG tablet Take 2 tablets (2 mg total) by mouth daily with breakfast. Patient not taking: Reported on 11/10/2023 04/17/23   McClung, Angela M, PA-C  isosorbide -hydrALAZINE  (BIDIL ) 20-37.5 MG tablet Take 1 tablet by mouth 2 (two) times daily. Patient not taking: Reported on 11/10/2023 12/14/22   Lawrance Presume, MD  torsemide  (DEMADEX ) 10 MG tablet Take 1 tablet (10 mg total) by mouth daily. Patient not taking: Reported on 11/10/2023 04/10/23   Leona Rake, MD      Allergies    Bee venom, Metformin , and Oatmeal    Review of Systems   Review of Systems  Constitutional:  Positive for fatigue.  All other systems reviewed and are negative.   Physical Exam Updated Vital Signs BP (!) 185/120 (BP Location: Left Arm)   Pulse 77   Temp 98.2 F (36.8 C) (Oral)   Resp 17   SpO2 100%  Physical Exam Vitals and nursing note reviewed.  Constitutional:      General: He is not in acute distress.    Appearance: He is well-developed. He is not ill-appearing, toxic-appearing or diaphoretic.  HENT:     Head: Normocephalic and atraumatic.     Right Ear: External ear normal.     Left Ear: External ear normal.     Nose: Nose normal.     Mouth/Throat:     Mouth: Mucous membranes are moist.  Eyes:     Extraocular Movements: Extraocular movements intact.     Conjunctiva/sclera: Conjunctivae normal.  Cardiovascular:     Rate and Rhythm: Normal rate and regular rhythm.  Pulmonary:     Effort: Pulmonary effort is normal. No respiratory distress.     Breath sounds: No wheezing or rales.  Abdominal:     General: There is no distension.  Palpations: Abdomen is soft.     Tenderness: There is no abdominal tenderness.  Musculoskeletal:        General: No deformity.     Cervical back: Normal range of motion and neck supple.     Comments: Bilateral BKA's.  Skin:    General: Skin is warm and dry.     Coloration: Skin is not jaundiced or pale.  Neurological:     General: No focal deficit present.     Mental Status: He is alert and oriented to person, place, and time.  Psychiatric:        Mood and Affect: Mood normal.        Behavior: Behavior normal.     ED Results / Procedures / Treatments   Labs (all labs ordered are listed, but only abnormal results are displayed) Labs Reviewed  CBC WITH  DIFFERENTIAL/PLATELET - Abnormal; Notable for the following components:      Result Value   RBC 3.78 (*)    Hemoglobin 10.4 (*)    HCT 33.3 (*)    RDW 16.2 (*)    All other components within normal limits  COMPREHENSIVE METABOLIC PANEL WITH GFR - Abnormal; Notable for the following components:   Glucose, Bld 154 (*)    BUN 28 (*)    Creatinine, Ser 1.73 (*)    AST 12 (*)    GFR, Estimated 43 (*)    All other components within normal limits  URINALYSIS, ROUTINE W REFLEX MICROSCOPIC - Abnormal; Notable for the following components:   Glucose, UA 50 (*)    Protein, ur >=300 (*)    All other components within normal limits  RAPID URINE DRUG SCREEN, HOSP PERFORMED - Abnormal; Notable for the following components:   Cocaine POSITIVE (*)    Tetrahydrocannabinol POSITIVE (*)    All other components within normal limits  CBG MONITORING, ED - Abnormal; Notable for the following components:   Glucose-Capillary 157 (*)    All other components within normal limits  MAGNESIUM   ETHANOL    EKG None  Radiology DG Knee 2 Views Right Result Date: 03/16/2024 CLINICAL DATA:  Pain at amputation stump. EXAM: RIGHT KNEE - 1-2 VIEW COMPARISON:  Right knee radiograph 03/05/2024. FINDINGS: The knee is located. Mild degenerative changes stable. Below the knee amputation is again noted. No acute bone or soft tissue abnormality or significant interval change is present. IMPRESSION: 1. No acute abnormality or significant interval change. 2. Below the knee amputation. 3. Mild degenerative changes. Electronically Signed   By: Audree Leas M.D.   On: 03/16/2024 07:06   DG Knee 2 Views Left Result Date: 03/16/2024 CLINICAL DATA:  Pain at amputation stump. EXAM: LEFT KNEE - 1-2 VIEW COMPARISON:  Two-view left knee radiographs 03/05/2024. FINDINGS: Knee is located. Mild degenerative changes are stable. Below the knee amputation noted. No acute bone or soft tissue abnormality or significant change is present.  IMPRESSION: 1. No acute abnormality or significant change. 2. Below the knee amputation. 3. Mild degenerative changes. Electronically Signed   By: Audree Leas M.D.   On: 03/16/2024 07:05   DG Chest Portable 1 View Result Date: 03/16/2024 CLINICAL DATA:  Altered mental status. Bilateral lower extremity pain. EXAM: PORTABLE CHEST 1 VIEW COMPARISON:  Two-view chest x-ray 11/05/2023 FINDINGS: The heart size and mediastinal contours are within normal limits. Both lungs are clear. The visualized skeletal structures are unremarkable. Lung volumes are low. IMPRESSION: 1. Low lung volumes. 2. No acute cardiopulmonary disease. Electronically Signed  By: Audree Leas M.D.   On: 03/16/2024 07:04   CT Head Wo Contrast Result Date: 03/16/2024 CLINICAL DATA:  Mental status change with unknown cause EXAM: CT HEAD WITHOUT CONTRAST TECHNIQUE: Contiguous axial images were obtained from the base of the skull through the vertex without intravenous contrast. RADIATION DOSE REDUCTION: This exam was performed according to the departmental dose-optimization program which includes automated exposure control, adjustment of the mA and/or kV according to patient size and/or use of iterative reconstruction technique. COMPARISON:  03/25/2024 FINDINGS: Brain: No evidence of acute infarction, hemorrhage, hydrocephalus, extra-axial collection or mass lesion/mass effect. Generalized atrophy with mild white matter low-density. Dilated perivascular space below the left putamen. Vascular: No hyperdense vessel or unexpected calcification. Skull: Suboccipital scarring with high-density thickening. Sinuses/Orbits: No acute finding. IMPRESSION: No acute or interval finding. Electronically Signed   By: Ronnette Coke M.D.   On: 03/16/2024 05:44    Procedures Procedures    Medications Ordered in ED Medications  isosorbide -hydrALAZINE  (BIDIL ) 20-37.5 MG per tablet 1 tablet (1 tablet Oral Given 03/16/24 0614)  gabapentin   (NEURONTIN ) capsule 300 mg (300 mg Oral Given 03/16/24 0981)    ED Course/ Medical Decision Making/ A&P                                 Medical Decision Making Amount and/or Complexity of Data Reviewed Labs: ordered. Radiology: ordered.  Risk Prescription drug management.   Patient presents to the ED after bystanders saw him sleeping on the ground outside and called 911.  Patient does have a history of homelessness.  Patient is unsure of why he was brought to the ED.  He does state that he has ongoing pain in his bilateral stumps.  He denies any falls today.  He states that he is feels tired.  He does not participate in exam but prefers to lay in stretcher try to sleep.  Areas of stumps showed no wounds, swelling, erythema, or warmth.  There is mild tenderness.  Vital signs on arrival notable for hypertension.  Home blood pressure medication was ordered.  Workup initiated.  Imaging studies did not show any acute findings.  Lab work shows baseline creatinine, baseline anemia, normal electrolytes, no evidence of UTI.  UDS was positive for cocaine and THC.  Discharge is anticipated.  Patient is still quite somnolent.  Care of patient signed out to oncoming ED provider.        Final Clinical Impression(s) / ED Diagnoses Final diagnoses:  Bilateral leg pain    Rx / DC Orders ED Discharge Orders     None         Iva Mariner, MD 03/16/24 [phone]

## 2024-03-17 DIAGNOSIS — M79604 Pain in right leg: Secondary | ICD-10-CM | POA: Diagnosis not present

## 2024-03-17 NOTE — Care Management (Addendum)
 Per chart review patient currently at [facility] ED unable to locate his wheelchair. At this time TOC does not have anything to offer due to purchasing a manual wheelchair for patient on 02/01/24.   This RNCM notified TOC Director, awaiting a response.      - 2:05pm Sent a follow up message to [facility] Director, awaiting a response.   - 3:45pm This RNCM called [facility] at [place] and [place] who both report they do not have any available wheelchairs. Called congregational RN who does not have manual wheelchair to fit patient.    -4:13pm Received call from patient's friend [phone]) where the patient's motorized wheelchair is located. ED dept team assisted with obtaining the motorized wheelchair from patient's friend truck. Wheelchair is at bedside with patient charging.   - 4:30pm This RNCM spoke with [facility] Case manager Jerryl Morin who reports no available resources at this time and have not seen patient this year.   Attached shelter resources and indicated patient needs to call case manager at the [facility].    No additional TOC needs at this time.

## 2024-03-17 NOTE — ED Notes (Signed)
 Patient is resting comfortably.

## 2024-03-17 NOTE — ED Notes (Signed)
Patient is resting comfortably. Rise and fall of chest noted.

## 2024-03-17 NOTE — ED Notes (Signed)
Pt given sandwich per request.

## 2024-03-17 NOTE — ED Notes (Signed)
 Pt in bed in nad at this time

## 2024-03-17 NOTE — ED Notes (Signed)
 Pt states he is going to "get [place] here" when wheelchair is charged

## 2024-03-17 NOTE — ED Notes (Signed)
 Pt refused discharge vitals

## 2024-03-29 ENCOUNTER — Emergency Department (HOSPITAL_COMMUNITY)
Admission: EM | Admit: 2024-03-29 | Discharge: 2024-03-30 | Disposition: A | Discharge status: Home / Self Care | Source: Home / Self Care | Attending: Emergency Medicine | Admitting: Emergency Medicine

## 2024-03-29 ENCOUNTER — Emergency Department (HOSPITAL_COMMUNITY)
Admission: EM | Admit: 2024-03-29 | Discharge: 2024-03-29 | Disposition: A | Discharge status: Home / Self Care | Attending: Emergency Medicine | Admitting: Emergency Medicine

## 2024-03-29 ENCOUNTER — Encounter (HOSPITAL_COMMUNITY): Payer: Self-pay

## 2024-03-29 ENCOUNTER — Other Ambulatory Visit: Payer: Self-pay

## 2024-03-29 DIAGNOSIS — Z89512 Acquired absence of left leg below knee: Secondary | ICD-10-CM | POA: Insufficient documentation

## 2024-03-29 DIAGNOSIS — Z79899 Other long term (current) drug therapy: Secondary | ICD-10-CM | POA: Insufficient documentation

## 2024-03-29 DIAGNOSIS — M79662 Pain in left lower leg: Secondary | ICD-10-CM | POA: Insufficient documentation

## 2024-03-29 DIAGNOSIS — E119 Type 2 diabetes mellitus without complications: Secondary | ICD-10-CM | POA: Insufficient documentation

## 2024-03-29 DIAGNOSIS — M79661 Pain in right lower leg: Secondary | ICD-10-CM | POA: Insufficient documentation

## 2024-03-29 DIAGNOSIS — I110 Hypertensive heart disease with heart failure: Secondary | ICD-10-CM | POA: Diagnosis not present

## 2024-03-29 DIAGNOSIS — M25562 Pain in left knee: Secondary | ICD-10-CM | POA: Insufficient documentation

## 2024-03-29 DIAGNOSIS — M25561 Pain in right knee: Secondary | ICD-10-CM | POA: Insufficient documentation

## 2024-03-29 DIAGNOSIS — Z89511 Acquired absence of right leg below knee: Secondary | ICD-10-CM | POA: Diagnosis not present

## 2024-03-29 DIAGNOSIS — I509 Heart failure, unspecified: Secondary | ICD-10-CM | POA: Insufficient documentation

## 2024-03-29 DIAGNOSIS — Z5900 Homelessness unspecified: Secondary | ICD-10-CM | POA: Diagnosis not present

## 2024-03-29 DIAGNOSIS — Z7982 Long term (current) use of aspirin: Secondary | ICD-10-CM | POA: Diagnosis not present

## 2024-03-29 DIAGNOSIS — M79604 Pain in right leg: Secondary | ICD-10-CM

## 2024-03-29 MED ORDER — ACETAMINOPHEN 325 MG PO TABS
975.0000 mg | ORAL_TABLET | Freq: Once | ORAL | Status: AC
  Administered 2024-03-29: 975 mg via ORAL
  Filled 2024-03-29: qty 3

## 2024-03-29 NOTE — ED Notes (Signed)
 Pt is up for discharge but refuse to leave. Security is at bedside escorting the pt out. Pt was cussing at nursing staff.

## 2024-03-29 NOTE — Care Management (Signed)
 Transition of Care [facility]) - Emergency Department Mini Assessment   Patient Details  Name: Scott Greer MRN: [id number] Date of Birth: 1956/10/02  Transition of Care [facility]) CM/SW Contact:    Ronni Colace, RN Phone Number: 03/29/2024, 11:40 AM   Clinical Narrative:  Patient presents with bilateral stump pain after having to walk on his stumps as his "wheelchair failed me". He has a Mining engineer and manual wheelchair. He is homeless and frequently comes in to the ED via squad, leaving his electric wheelchair in various places.  TOC has purchased a manual wheelchair for him as well. Spoke to patient at bedisde. He states his electric wheelchair has a  short in it, and therefore he cannot ride it, it is on [address]. His manual wheelchair is at the [employer], however they are not open until Tuesday. Unsure what we can do, will not be purchasing another wheelchair, and have no loaners. When he gets to a place where it can be repaired he can call the company to try to fix it.   ED Mini Assessment: What brought you to the Emergency Department? : walking on stumps,  wheelchair failed ( electric)                Patient Contact and Communications        ,                 Admission diagnosis:  leg pain Patient Active Problem List   Diagnosis Date Noted   PDR (proliferative diabetic retinopathy) (HCC) 04/10/2023   Generalized weakness 04/09/2023   Dehydration 04/09/2023   Fall at home, initial encounter 04/09/2023   Acute cystitis 04/09/2023   History of anemia due to chronic kidney disease 04/09/2023   Acute renal failure superimposed on stage 3a chronic kidney disease (HCC) 04/08/2023   Encounter for power mobility device assessment 01/03/2023   Chronic diastolic CHF (congestive heart failure) (HCC) 12/01/2021   S/P BKA (below knee amputation) bilateral (HCC) 07/18/2021   Gangrene of right foot (HCC)    Foot pain, right 04/14/2021   Status  post below-knee amputation (HCC) 02/17/2021   Wound dehiscence    Gangrene of toe of left foot (HCC)    Cutaneous abscess of left foot    Left foot infection 02/07/2021   Leukocytosis 02/07/2021   Thrombocytosis 02/07/2021   Hyponatremia 02/07/2021   AKI (acute kidney injury) (HCC) 02/07/2021   Hyperglycemia due to diabetes mellitus (HCC) 02/07/2021   New onset of congestive heart failure (HCC) 12/26/2020   CKD (chronic kidney disease) stage 3, GFR 30-59 ml/min (HCC) 12/26/2020   Acute systolic CHF (congestive heart failure) (HCC) 12/26/2020   Anemia, chronic disease 06/22/2020   Amputee, great toe, right (HCC) 06/20/2020   Normocytic anemia 06/20/2020   Erectile dysfunction associated with type 2 diabetes mellitus (HCC) 06/20/2020   Positive for macroalbuminuria 10/24/2019   Amputation of left great toe (HCC) 10/23/2019   Tobacco dependence 10/23/2019   Diabetic foot infection (HCC)    Noncompliance    Glaucoma    Hyperlipidemia    Essential hypertension 11/06/2003   DM2 (diabetes mellitus, type 2) (HCC) 11/05/1997   PCP:  Lawrance Presume, MD Pharmacy:   [facility] [address] [place] [address] Phone: [phone] Fax: [phone]

## 2024-03-29 NOTE — ED Triage Notes (Signed)
 Pt BIB [facility] d/t bilateral leg pain. Pt has bilateral BKA stated his wheel chair failed on him, so he has been walking around with residual limbs. Pt c/o them being painful and swollen. A&O X4. VSS.

## 2024-03-29 NOTE — ED Provider Notes (Addendum)
 [place] EMERGENCY DEPARTMENT AT [name] Provider Note   CSN: [id number] Arrival date & time: 03/29/24  1040     History  Chief Complaint  Patient presents with   Leg Pain    Scott Greer is a 68 y.o. male.   Leg Pain  Patient has history of glaucoma hypertension hyperlipidemia diabetes osteomyelitis CHF.  Patient is homeless.  Patient was seen in the emergency room on the 12th.  At that time he was complaining of pain in his bilateral stumps.  At that time he had imaging test and laboratory tests.  Patient has a history of bilateral BKA.  Patient states his wheelchair failed on him.  He had had to walk around on his residual stumps.  Patient states there was gravel on the ground.  He has had pain and discomfort in that area since then.  Home Medications Prior to Admission medications   Medication Sig Start Date End Date Taking? Authorizing Provider  aspirin  (ASPIRIN  LOW DOSE) 81 MG chewable tablet CHEW ONE TABLET BY MOUTH ONCE DAILY Needs appointment for further refills Patient not taking: Reported on 11/10/2023 10/25/22   Hassie Lint, PA-C  atorvastatin  (LIPITOR) 10 MG tablet Take 1 tablet (10 mg total) by mouth every evening. Patient not taking: Reported on 11/10/2023 12/14/22   Lawrance Presume, MD  empagliflozin  (JARDIANCE ) 10 MG TABS tablet Take 1 tablet (10 mg total) by mouth daily at 12 noon. Patient not taking: Reported on 11/10/2023 12/14/22   Lawrance Presume, MD  ferrous sulfate  (FEROSUL) 325 (65 FE) MG tablet TAKE ONE TABLET BY MOUTH EVERY MORNING WITH BREAKFAST Patient not taking: Reported on 11/10/2023 08/15/22   Lawrance Presume, MD  gabapentin  (NEURONTIN ) 300 MG capsule Take 1 capsule (300 mg total) by mouth 2 (two) times daily. Patient not taking: Reported on 11/10/2023 01/17/23   Lawrance Presume, MD  glimepiride  (AMARYL ) 1 MG tablet Take 2 tablets (2 mg total) by mouth daily with breakfast. Patient not taking: Reported on 11/10/2023 04/17/23    McClung, Angela M, PA-C  isosorbide -hydrALAZINE  (BIDIL ) 20-37.5 MG tablet Take 1 tablet by mouth 2 (two) times daily. Patient not taking: Reported on 11/10/2023 12/14/22   Lawrance Presume, MD  torsemide  (DEMADEX ) 10 MG tablet Take 1 tablet (10 mg total) by mouth daily. Patient not taking: Reported on 11/10/2023 04/10/23   Leona Rake, MD      Allergies    Bee venom, Metformin , and Oatmeal    Review of Systems   Review of Systems  Physical Exam Updated Vital Signs BP (!) 160/88   Pulse 68   Temp 97.8 F (36.6 C) (Oral)   Resp 18   SpO2 100%  Physical Exam Vitals and nursing note reviewed.  Constitutional:      Appearance: He is well-developed. He is not diaphoretic.  HENT:     Head: Normocephalic and atraumatic.     Right Ear: External ear normal.     Left Ear: External ear normal.  Eyes:     General: No scleral icterus.       Right eye: No discharge.        Left eye: No discharge.     Conjunctiva/sclera: Conjunctivae normal.  Neck:     Trachea: No tracheal deviation.  Cardiovascular:     Rate and Rhythm: Normal rate.  Pulmonary:     Effort: Pulmonary effort is normal. No respiratory distress.     Breath sounds: No stridor.  Abdominal:  General: There is no distension.  Musculoskeletal:        General: Tenderness present. No swelling or deformity.     Cervical back: Neck supple.     Right lower leg: No edema.     Left lower leg: No edema.     Comments: Patient has tenderness to palpation bilateral stumps below his BKA, there is no erythema there is no edema there is no breakdown of the skin, the tissue is soft and not indurated  Skin:    General: Skin is warm and dry.     Findings: No rash.  Neurological:     Mental Status: He is alert. Mental status is at baseline.     Cranial Nerves: No dysarthria or facial asymmetry.     Motor: No seizure activity.     ED Results / Procedures / Treatments   Labs (all labs ordered are listed, but only abnormal results  are displayed) Labs Reviewed - No data to display  EKG None  Radiology No results found.  Procedures Procedures    Medications Ordered in ED Medications  acetaminophen  (TYLENOL ) tablet 975 mg (has no administration in time range)    ED Course/ Medical Decision Making/ A&P                                 Medical Decision Making Risk OTC drugs.   Patient is complaining of pain in his stumps since having to walk around on gravel.  Patient does not have any evidence of injury.  There is no signs of infection.  Patient states he also has been having to sleep outside.  He is homeless.  Patient does not have any signs of acute injury or infection.  Will provide him something to eat.  Have given a dose of Tylenol  for pain.  Will ask social worker to make sure patient still has access to a wheelchair   Case discussed with Child psychotherapist.  Patient does have an electronic wheelchair but it has been shortening out on him.  Patient also has a wheelchair that is currently at [facility] [employer].  Patient reportedly has been repetitively coming to the hospital and we have obtained replacement wheelchairs.  Patient is stable for discharge     Final Clinical Impression(s) / ED Diagnoses Final diagnoses:  Pain in both lower extremities  Homeless    Rx / DC Orders ED Discharge Orders          Ordered    For home use only DME standard manual wheelchair with seat cushion       Comments: Patient suffers from bilateral amputation which impairs their ability to perform daily activities like bathing, dressing, feeding, grooming, and toileting in the home.  A crutch or walker will not resolve issue with performing activities of daily living. A wheelchair will allow patient to safely perform daily activities. Patient can safely propel the wheelchair in the home or has a caregiver who can provide assistance. Length of need Lifetime. Accessories: elevating leg rests (ELRs), wheel locks, extensions  and anti-tippers.   03/29/24 1102              Trish Furl, MD 03/29/24 1103    Trish Furl, MD 03/29/24 [phone]

## 2024-03-29 NOTE — Discharge Instructions (Signed)
 Take over-the-counter medications such as Tylenol  help with aches and pains.  Follow-up with a primary care doctor to be rechecked

## 2024-03-29 NOTE — ED Triage Notes (Signed)
 Pt complaining of bilateral leg pain that started when his motorized wheel chair broke down. Said both legs are swollen and hurting.

## 2024-03-30 MED ORDER — ACETAMINOPHEN 500 MG PO TABS
1000.0000 mg | ORAL_TABLET | Freq: Once | ORAL | Status: AC
  Administered 2024-03-30: 1000 mg via ORAL
  Filled 2024-03-30: qty 2

## 2024-03-30 NOTE — ED Notes (Signed)
 ED Provider at bedside.

## 2024-03-30 NOTE — ED Notes (Signed)
 Food and drink given tolerated well.

## 2024-03-30 NOTE — Discharge Planning (Signed)
 RNCM notes consult to [facility] for discharge planning.  RNCM notes that pt was given wheelchair on yesterday for mobility and transportation.  RNCM met with pt at bedside.  Pt states he was given hospital wheelchair that requires someone to push.  Pt states he has spoken with police officer ([place]?) to help retrieve his electric wheelchair, but he was on vacation and not able to help.  RNCM located used wheelchair for pt use.  RNCM will discuss with EDP before presenting to pt for use.  Ivylynn Hoppes J. Rachel Budds, BSN, RN, NCM  Transitions of Care  Nurse Case Manager  [facility] Emergency Departments  Operative Services  [phone]

## 2024-03-30 NOTE — ED Provider Notes (Signed)
 [facility] Emergency Department Provider Note MRN:  [id number]  Arrival date & time: 03/30/24     Chief Complaint   Leg Pain   History of Present Illness   Scott Greer is a 68 y.o. year-old male presents to the ED with chief complaint of bilateral leg pain.  He was seen earlier today for the same.  He was given a wheelchair, but patient states that he cannot use the wheelchair.  He states that he needs additional assistance.  States that prior to getting the wheelchair he had been crawling around on his stumps.  He states he has not eaten anything all day.  He denies new complaints.  History provided by patient.   Review of Systems  Pertinent positive and negative review of systems noted in HPI.    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 erythema, or skin breakdown SKIN:  no rash, atraumatic   *Additional and/or pertinent findings included in MDM below  Diagnostic and Interventional Summary    EKG Interpretation Date/Time:    Ventricular Rate:    PR Interval:    QRS Duration:    QT Interval:    QTC Calculation:   R Axis:      Text Interpretation:         Labs Reviewed - No data to display  No orders to display    Medications - No data to display   Procedures  /  Critical Care Procedures  ED Course and Medical Decision Making  I have reviewed the triage vital signs, the nursing notes, and pertinent available records from the EMR.  Social Determinants  Affecting Complexity of Care: Patient has no clinically significant social determinants affecting this chief complaint..   ED Course:    Medical Decision Making Patient here for bilateral knee pain/leg pain.  He states that he has been crawling around on his stumps.  He was given wheelchair earlier today.  States that he cannot use the wheelchair effectively.  He has nowhere to go.  States he has had no food today.  I will consult TOC and social work for assistance, may need placement for safe discharge plan.         Consultants: No consultations were needed in caring for this patient.   Treatment and Plan: Emergency department workup does not suggest an emergent condition requiring admission or immediate intervention beyond  what has been performed at this time. The patient is safe for discharge and has  been instructed to return immediately for worsening symptoms, change in  symptoms or any other concerns   Will consult social work for safe discharge plan.   Final Clinical Impressions(s) / ED Diagnoses     ICD-10-CM   1. Bilateral leg pain  M79.604    M79.605       ED Discharge Orders     None         Discharge Instructions Discussed with and Provided to  Patient:   Discharge Instructions   None      Sherel Dikes, Kirby Peoples 03/30/24 0107    Kelsey Patricia, MD 03/30/24 [phone]

## 2024-03-30 NOTE — Discharge Planning (Addendum)
 RNCM provided used manual wheelchair for patient use.  RNCM explained that it is the only one available to him at this time and that he will have to keep up with it for mobility and transportation.  Pt verbalizes understanding by saying, "I know."   Scott Greer J. Rachel Budds, BSN, RN, NCM  Transitions of Care  Nurse Case Manager  [facility] Emergency Departments  Operative Services  [phone]

## 2024-05-04 ENCOUNTER — Emergency Department (HOSPITAL_COMMUNITY)
Admission: EM | Admit: 2024-05-04 | Discharge: 2024-05-05 | Disposition: A | Discharge status: Home / Self Care | Attending: Emergency Medicine | Admitting: Emergency Medicine

## 2024-05-04 DIAGNOSIS — Z5900 Homelessness unspecified: Secondary | ICD-10-CM | POA: Diagnosis not present

## 2024-05-04 DIAGNOSIS — M545 Low back pain, unspecified: Secondary | ICD-10-CM | POA: Insufficient documentation

## 2024-05-04 DIAGNOSIS — E119 Type 2 diabetes mellitus without complications: Secondary | ICD-10-CM | POA: Diagnosis not present

## 2024-05-04 DIAGNOSIS — Z79899 Other long term (current) drug therapy: Secondary | ICD-10-CM | POA: Diagnosis not present

## 2024-05-04 DIAGNOSIS — Z7984 Long term (current) use of oral hypoglycemic drugs: Secondary | ICD-10-CM | POA: Diagnosis not present

## 2024-05-04 DIAGNOSIS — R600 Localized edema: Secondary | ICD-10-CM

## 2024-05-04 DIAGNOSIS — Z7982 Long term (current) use of aspirin: Secondary | ICD-10-CM | POA: Insufficient documentation

## 2024-05-04 DIAGNOSIS — I10 Essential (primary) hypertension: Secondary | ICD-10-CM | POA: Insufficient documentation

## 2024-05-04 DIAGNOSIS — M79661 Pain in right lower leg: Secondary | ICD-10-CM | POA: Insufficient documentation

## 2024-05-04 DIAGNOSIS — M79662 Pain in left lower leg: Secondary | ICD-10-CM | POA: Diagnosis not present

## 2024-05-04 DIAGNOSIS — M7989 Other specified soft tissue disorders: Secondary | ICD-10-CM | POA: Insufficient documentation

## 2024-05-04 MED ORDER — FUROSEMIDE 20 MG PO TABS
20.0000 mg | ORAL_TABLET | Freq: Every day | ORAL | 0 refills | Status: DC
  Filled 2024-05-04: qty 7, 7d supply, fill #0

## 2024-05-04 MED ORDER — FUROSEMIDE 20 MG PO TABS
20.0000 mg | ORAL_TABLET | Freq: Every day | ORAL | Status: DC
  Filled 2024-05-04: qty 1

## 2024-05-04 NOTE — ED Provider Notes (Signed)
 [place] EMERGENCY DEPARTMENT AT [facility] Provider Note   CSN: [id number] Arrival date & time: 05/04/24  8191     Patient presents with: No chief complaint on file.   Scott Greer is a 67 y.o. male.  {Add pertinent medical, surgical, social history, OB history to HPI:32947} Pt is a 68 year old male that came in with a c/o bl lower extremity swelling and pain. Pt has a history of homelessness and bilateral BKA. He said his swelling started over a week ago with constant aching. He mentioned having difficulties using his wheelchair as that has caused calluses in his hands. He denies any fever, SOB, CP, dizziness, difficulty urinating. He endorses chills. Pt has a PMH of DM, HTN, and heart conditions. He said he was on several medications for his medical conditions but has not taken them for the past year. His PCP was Dr. Adrien Daring but he has not seen her in months.         Prior to Admission medications   Medication Sig Start Date End Date Taking? Authorizing Provider  aspirin  (ASPIRIN  LOW DOSE) 81 MG chewable tablet CHEW ONE TABLET BY MOUTH ONCE DAILY Needs appointment for further refills Patient not taking: Reported on 11/10/2023 10/25/22   Danton Jon HERO, PA-C  atorvastatin  (LIPITOR) 10 MG tablet Take 1 tablet (10 mg total) by mouth every evening. Patient not taking: Reported on 11/10/2023 12/14/22   Vicci Barnie NOVAK, MD  empagliflozin  (JARDIANCE ) 10 MG TABS tablet Take 1 tablet (10 mg total) by mouth daily at 12 noon. Patient not taking: Reported on 11/10/2023 12/14/22   Vicci Barnie NOVAK, MD  ferrous sulfate  (FEROSUL) 325 (65 FE) MG tablet TAKE ONE TABLET BY MOUTH EVERY MORNING WITH BREAKFAST Patient not taking: Reported on 11/10/2023 08/15/22   Vicci Barnie NOVAK, MD  gabapentin  (NEURONTIN ) 300 MG capsule Take 1 capsule (300 mg total) by mouth 2 (two) times daily. Patient not taking: Reported on 11/10/2023 01/17/23   Vicci Barnie NOVAK, MD  glimepiride  (AMARYL ) 1 MG  tablet Take 2 tablets (2 mg total) by mouth daily with breakfast. Patient not taking: Reported on 11/10/2023 04/17/23   McClung, Angela M, PA-C  isosorbide -hydrALAZINE  (BIDIL ) 20-37.5 MG tablet Take 1 tablet by mouth 2 (two) times daily. Patient not taking: Reported on 11/10/2023 12/14/22   Vicci Barnie NOVAK, MD  torsemide  (DEMADEX ) 10 MG tablet Take 1 tablet (10 mg total) by mouth daily. Patient not taking: Reported on 11/10/2023 04/10/23   Jillian Buttery, MD    Allergies: Bee venom, Metformin , and Oatmeal    Review of Systems  Constitutional:        All pertinent ROS in the HPI    Updated Vital Signs BP (!) 174/93   Pulse 67   Temp (!) 97.5 F (36.4 C) (Oral)   Resp 14   SpO2 100%   Physical Exam Constitutional:      Comments: Pt answers all questions but appears very somnolent. He had to be woken up several times during the encounter.  HENT:     Head: Atraumatic.   Cardiovascular:     Rate and Rhythm: Normal rate and regular rhythm.  Pulmonary:     Breath sounds: Normal breath sounds.   Musculoskeletal:     Comments: BL lower extremities: Erythema and swelling with calluses. Mild tenderness on palpation.   Low back pain on palpation   Neurological:     Mental Status: He is oriented to person, place, and time.     (  all labs ordered are listed, but only abnormal results are displayed) Labs Reviewed - No data to display  EKG: None  Radiology: No results found.  {Document cardiac monitor, telemetry assessment procedure when appropriate:32947} Procedures   Medications Ordered in the ED - No data to display    {Click here for ABCD2, HEART and other calculators REFRESH Note before signing:1}                              Medical Decision Making Pt came in due to lower extremity swelling. This seems like a chronic issue as pt has been coming to the ED for several times in the past. Pt is not compliant with his medications. His BMP . Pt will receive a dose of lasix  in  ED. He will be d/c with Lasix  20 mg once a day for 7 days.   Amount and/or Complexity of Data Reviewed Labs: ordered.  Risk Prescription drug management.   ***  {Document critical care time when appropriate  Document review of labs and clinical decision tools ie CHADS2VASC2, etc  Document your independent review of radiology images and any outside records  Document your discussion with family members, caretakers and with consultants  Document social determinants of health affecting pt's care  Document your decision making why or why not admission, treatments were needed:32947:::1}   Final diagnoses:  None    ED Discharge Orders     None

## 2024-05-04 NOTE — ED Triage Notes (Signed)
 PT BIB [facility] for swelling and tenderness below the knee bilaterally. Pt is bilateral BKA amputee.  There are maggots to bottom and on wheelchair per EMS.   150/70 HRE 61 RR 15, 97% CBG 127

## 2024-05-04 NOTE — ED Notes (Signed)
Pt refused lab work at this time.

## 2024-05-05 ENCOUNTER — Other Ambulatory Visit (HOSPITAL_COMMUNITY): Payer: Self-pay

## 2024-05-05 LAB — BASIC METABOLIC PANEL WITH GFR
Anion gap: 9 (ref 5–15)
BUN: 17 mg/dL (ref 8–23)
CO2: 22 mmol/L (ref 22–32)
Calcium: 8.9 mg/dL (ref 8.9–10.3)
Chloride: 109 mmol/L (ref 98–111)
Creatinine, Ser: 1.79 mg/dL — ABNORMAL HIGH (ref 0.61–1.24)
GFR, Estimated: 42 mL/min — ABNORMAL LOW (ref >60)
Glucose, Bld: 97 mg/dL (ref 70–99)
Potassium: 3.9 mmol/L (ref 3.5–5.1)
Sodium: 140 mmol/L (ref 135–145)

## 2024-05-05 NOTE — Discharge Instructions (Addendum)
 Pt prescribed furosemide  for 7 days He needs to f/u with a PCP to establish care

## 2024-05-16 ENCOUNTER — Encounter (HOSPITAL_COMMUNITY): Payer: Self-pay | Admitting: Emergency Medicine

## 2024-05-16 ENCOUNTER — Other Ambulatory Visit: Payer: Self-pay

## 2024-05-16 ENCOUNTER — Emergency Department (HOSPITAL_COMMUNITY)

## 2024-05-16 ENCOUNTER — Encounter (HOSPITAL_COMMUNITY): Payer: Self-pay

## 2024-05-16 ENCOUNTER — Inpatient Hospital Stay (HOSPITAL_COMMUNITY)
Admission: EM | Admit: 2024-05-16 | Discharge: 2024-05-19 | DRG: 683 | Disposition: A | Discharge status: Home / Self Care | Attending: Internal Medicine | Admitting: Internal Medicine

## 2024-05-16 ENCOUNTER — Emergency Department (HOSPITAL_COMMUNITY)
Admission: EM | Admit: 2024-05-16 | Discharge: 2024-05-16 | Disposition: A | Discharge status: Home / Self Care | Attending: Emergency Medicine | Admitting: Emergency Medicine

## 2024-05-16 DIAGNOSIS — Z91199 Patient's noncompliance with other medical treatment and regimen due to unspecified reason: Secondary | ICD-10-CM

## 2024-05-16 DIAGNOSIS — E785 Hyperlipidemia, unspecified: Secondary | ICD-10-CM | POA: Diagnosis present

## 2024-05-16 DIAGNOSIS — M79604 Pain in right leg: Secondary | ICD-10-CM | POA: Insufficient documentation

## 2024-05-16 DIAGNOSIS — Z993 Dependence on wheelchair: Secondary | ICD-10-CM

## 2024-05-16 DIAGNOSIS — I5042 Chronic combined systolic (congestive) and diastolic (congestive) heart failure: Secondary | ICD-10-CM

## 2024-05-16 DIAGNOSIS — D631 Anemia in chronic kidney disease: Secondary | ICD-10-CM | POA: Diagnosis present

## 2024-05-16 DIAGNOSIS — E1122 Type 2 diabetes mellitus with diabetic chronic kidney disease: Secondary | ICD-10-CM | POA: Diagnosis present

## 2024-05-16 DIAGNOSIS — Z833 Family history of diabetes mellitus: Secondary | ICD-10-CM

## 2024-05-16 DIAGNOSIS — M7989 Other specified soft tissue disorders: Secondary | ICD-10-CM | POA: Diagnosis not present

## 2024-05-16 DIAGNOSIS — Z888 Allergy status to other drugs, medicaments and biological substances status: Secondary | ICD-10-CM

## 2024-05-16 DIAGNOSIS — G8929 Other chronic pain: Secondary | ICD-10-CM | POA: Diagnosis present

## 2024-05-16 DIAGNOSIS — N1832 Chronic kidney disease, stage 3b: Secondary | ICD-10-CM | POA: Diagnosis present

## 2024-05-16 DIAGNOSIS — N179 Acute kidney failure, unspecified: Secondary | ICD-10-CM | POA: Diagnosis not present

## 2024-05-16 DIAGNOSIS — Z89512 Acquired absence of left leg below knee: Secondary | ICD-10-CM

## 2024-05-16 DIAGNOSIS — H409 Unspecified glaucoma: Secondary | ICD-10-CM | POA: Diagnosis present

## 2024-05-16 DIAGNOSIS — E119 Type 2 diabetes mellitus without complications: Secondary | ICD-10-CM

## 2024-05-16 DIAGNOSIS — M79605 Pain in left leg: Secondary | ICD-10-CM | POA: Insufficient documentation

## 2024-05-16 DIAGNOSIS — I5032 Chronic diastolic (congestive) heart failure: Secondary | ICD-10-CM | POA: Diagnosis not present

## 2024-05-16 DIAGNOSIS — E1142 Type 2 diabetes mellitus with diabetic polyneuropathy: Secondary | ICD-10-CM

## 2024-05-16 DIAGNOSIS — Z91018 Allergy to other foods: Secondary | ICD-10-CM

## 2024-05-16 DIAGNOSIS — Z5900 Homelessness unspecified: Secondary | ICD-10-CM

## 2024-05-16 DIAGNOSIS — Z823 Family history of stroke: Secondary | ICD-10-CM

## 2024-05-16 DIAGNOSIS — Z91030 Bee allergy status: Secondary | ICD-10-CM

## 2024-05-16 DIAGNOSIS — I130 Hypertensive heart and chronic kidney disease with heart failure and stage 1 through stage 4 chronic kidney disease, or unspecified chronic kidney disease: Secondary | ICD-10-CM | POA: Diagnosis present

## 2024-05-16 DIAGNOSIS — F17290 Nicotine dependence, other tobacco product, uncomplicated: Secondary | ICD-10-CM | POA: Diagnosis present

## 2024-05-16 DIAGNOSIS — D638 Anemia in other chronic diseases classified elsewhere: Secondary | ICD-10-CM | POA: Diagnosis present

## 2024-05-16 DIAGNOSIS — Z89511 Acquired absence of right leg below knee: Secondary | ICD-10-CM

## 2024-05-16 HISTORY — DX: Type 2 diabetes mellitus without complications: E11.9

## 2024-05-16 HISTORY — DX: Chronic kidney disease, stage 3 unspecified: N18.30

## 2024-05-16 HISTORY — DX: Hyperlipidemia, unspecified: E78.5

## 2024-05-16 HISTORY — DX: Homelessness unspecified: Z59.00

## 2024-05-16 HISTORY — DX: Tobacco use: Z72.0

## 2024-05-16 HISTORY — DX: Acquired absence of unspecified leg below knee: Z89.519

## 2024-05-16 HISTORY — DX: Cannabis abuse, uncomplicated: F12.10

## 2024-05-16 HISTORY — DX: Cocaine abuse, uncomplicated: F14.10

## 2024-05-16 HISTORY — DX: Unspecified diastolic (congestive) heart failure: I50.30

## 2024-05-16 LAB — CBC
HCT: 28.7 % — ABNORMAL LOW (ref 39.0–52.0)
Hemoglobin: 9 g/dL — ABNORMAL LOW (ref 13.0–17.0)
MCH: 27.8 pg (ref 26.0–34.0)
MCHC: 31.4 g/dL (ref 30.0–36.0)
MCV: 88.6 fL (ref 80.0–100.0)
Platelets: 254 K/uL (ref 150–400)
RBC: 3.24 MIL/uL — ABNORMAL LOW (ref 4.22–5.81)
RDW: 15.8 % — ABNORMAL HIGH (ref 11.5–15.5)
WBC: 6.1 K/uL (ref 4.0–10.5)
nRBC: 0 % (ref 0.0–0.2)

## 2024-05-16 LAB — BASIC METABOLIC PANEL WITH GFR
Anion gap: 8 (ref 5–15)
BUN: 27 mg/dL — ABNORMAL HIGH (ref 8–23)
CO2: 26 mmol/L (ref 22–32)
Calcium: 8.6 mg/dL — ABNORMAL LOW (ref 8.9–10.3)
Chloride: 105 mmol/L (ref 98–111)
Creatinine, Ser: 2.49 mg/dL — ABNORMAL HIGH (ref 0.61–1.24)
GFR, Estimated: 28 mL/min — ABNORMAL LOW (ref >60)
Glucose, Bld: 172 mg/dL — ABNORMAL HIGH (ref 70–99)
Potassium: 4 mmol/L (ref 3.5–5.1)
Sodium: 139 mmol/L (ref 135–145)

## 2024-05-16 LAB — BRAIN NATRIURETIC PEPTIDE: B Natriuretic Peptide: 89.8 pg/mL (ref 0.0–100.0)

## 2024-05-16 MED ORDER — HYDRALAZINE HCL 25 MG PO TABS
25.0000 mg | ORAL_TABLET | Freq: Four times a day (QID) | ORAL | Status: DC | PRN
  Administered 2024-05-17 – 2024-05-18 (×3): 25 mg via ORAL
  Filled 2024-05-16 (×3): qty 1

## 2024-05-16 MED ORDER — SENNA 8.6 MG PO TABS: 1.0000 | ORAL_TABLET | Freq: Every day | ORAL | Status: DC | PRN

## 2024-05-16 MED ORDER — OXYCODONE HCL 5 MG PO TABS
5.0000 mg | ORAL_TABLET | Freq: Four times a day (QID) | ORAL | Status: DC | PRN
  Administered 2024-05-18 (×2): 10 mg via ORAL
  Filled 2024-05-16 (×2): qty 2

## 2024-05-16 MED ORDER — INSULIN ASPART 100 UNIT/ML IJ SOLN
0.0000 [IU] | Freq: Every day | INTRAMUSCULAR | Status: DC
  Filled 2024-05-16: qty 0.05

## 2024-05-16 MED ORDER — ACETAMINOPHEN 650 MG RE SUPP
650.0000 mg | Freq: Four times a day (QID) | RECTAL | Status: DC | PRN
Start: 2024-05-16 — End: 2024-05-19

## 2024-05-16 MED ORDER — HEPARIN SODIUM (PORCINE) 5000 UNIT/ML IJ SOLN
5000.0000 [IU] | Freq: Three times a day (TID) | INTRAMUSCULAR | Status: DC
  Administered 2024-05-17 (×2): 5000 [IU] via SUBCUTANEOUS
  Filled 2024-05-16 (×6): qty 1

## 2024-05-16 MED ORDER — ACETAMINOPHEN 325 MG PO TABS: 650.0000 mg | ORAL_TABLET | Freq: Four times a day (QID) | ORAL | Status: DC | PRN

## 2024-05-16 MED ORDER — PROCHLORPERAZINE EDISYLATE 10 MG/2ML IJ SOLN: 5.0000 mg | Freq: Four times a day (QID) | INTRAMUSCULAR | Status: DC | PRN

## 2024-05-16 MED ORDER — ACETAMINOPHEN 500 MG PO TABS
1000.0000 mg | ORAL_TABLET | Freq: Once | ORAL | Status: AC
  Administered 2024-05-16: 1000 mg via ORAL
  Filled 2024-05-16: qty 2

## 2024-05-16 MED ORDER — LACTATED RINGERS IV BOLUS
1000.0000 mL | Freq: Once | INTRAVENOUS | Status: AC
  Administered 2024-05-16: 1000 mL via INTRAVENOUS

## 2024-05-16 MED ORDER — INSULIN ASPART 100 UNIT/ML IJ SOLN
0.0000 [IU] | Freq: Three times a day (TID) | INTRAMUSCULAR | Status: DC
  Administered 2024-05-17 – 2024-05-18 (×5): 1 [IU] via SUBCUTANEOUS
  Filled 2024-05-16: qty 0.06

## 2024-05-16 NOTE — ED Triage Notes (Signed)
 PT [place] via [facility]  c/o bilateral leg pain. Pt is a bilateral AKA. Pt states that he has more swelling than normal .

## 2024-05-16 NOTE — ED Notes (Signed)
 This nurse went over discharge paperwork with patient. This nurse asked patient if he needed assistance getting into his wheelchair. Patient declined needing assistance, this nurse let side rail down and moved wheelchair within patient's reach.

## 2024-05-16 NOTE — ED Triage Notes (Signed)
 Pt in from gas station via [facility] for BLE pain and swelling. Pt states he went to [facility] for same last night and feels no better. Hx of bilat AKA, blisters to both  VS en route: 140palp 80HR 97%RA CBG 173 99.5

## 2024-05-16 NOTE — H&P (Signed)
 History and Physical    Scott Greer FMW:[id number] DOB: 05/03/1956 DOA: 05/16/2024  PCP: Vicci Barnie NOVAK, MD   Patient coming from: Home   Chief Complaint: Bilateral BKA stump pain and swelling   HPI: Scott Greer is a 68 y.o. male with medical history significant for hypertension, hyperlipidemia, type 2 diabetes mellitus, chronic HFpEF, and bilateral BKA's who presents with bilateral BKA stump pain and swelling.  Patient reports months of swelling and discomfort to the bilateral BKA stumps.  He denies chest pain, shortness of breath, fever, or chills.  He was seen for this in the ED early this morning but would not cooperate with the ED PA's interview and was discharged.  ED Course: Upon arrival to the ED, patient is found to be afebrile and saturating well on room air with normal HR and stable BP.  Labs are most notable for creatinine 2.49, normal WBC, hemoglobin 9.0, and normal BNP.  Chest x-ray is negative for acute findings.  Patient was treated with acetaminophen  and 1 L LR in the ED.  Review of Systems:  All other systems reviewed and apart from HPI, are negative.  Past Medical History:  Diagnosis Date   CHF (congestive heart failure) (HCC)    Dehiscence of amputation stump (HCC)    left great toe   Diabetes mellitus without complication (HCC) 1999   Type II   Glaucoma 2015   Hyperlipidemia    Hypertension 2005   Left foot infection 02/07/2021   Osteomyelitis (HCC)    Wears glasses     Past Surgical History:  Procedure Laterality Date   AMPUTATION Left 06/05/2019   Procedure: LEFT GREAT TOE AMPUTATION;  Surgeon: Harden Jerona GAILS, MD;  Location: [facility] OR;  Service: Orthopedics;  Laterality: Left;   AMPUTATION Left 07/10/2019   Procedure: LEFT FOOT 1ST RAY AMPUTATION;  Surgeon: Harden Jerona GAILS, MD;  Location[facility] OR;  Service: Orthopedics;  Laterality: Left;   AMPUTATION Right 05/25/2020   Procedure: RIGHT GREAT TOE AMPUTATION;  Surgeon: Harden Jerona GAILS, MD;  Location: [facility]  OR;  Service: Orthopedics;  Laterality: Right;   AMPUTATION Left 01/25/2021   Procedure: LEFT 2ND TOE AMPUTATION;  Surgeon: Harden Jerona GAILS, MD;  Location: [facility] OR;  Service: Orthopedics;  Laterality: Left;   AMPUTATION Left 02/08/2021   Procedure: LEFT TRANSMETATARSAL AMPUTATION;  Surgeon: Harden Jerona GAILS, MD;  Location: [facility] OR;  Service: Orthopedics;  Laterality: Left;   AMPUTATION Left 02/10/2021   Procedure: AMPUTATION BELOW KNEE;  Surgeon: Harden Jerona GAILS, MD;  Location: [facility] OR;  Service: Orthopedics;  Laterality: Left;   AMPUTATION Right 04/14/2021   Procedure: RIGHT 1ST AND 2ND RAY AMPUTATION;  Surgeon: Harden Jerona GAILS, MD;  Location: [facility] OR;  Service: Orthopedics;  Laterality: Right;   AMPUTATION Right 04/26/2021   Procedure: RIGHT BELOW KNEE AMPUTATION;  Surgeon: Harden Jerona GAILS, MD;  Location: [facility] OR;  Service: Orthopedics;  Laterality: Right;   NO PAST SURGERIES     RIGHT/LEFT HEART CATH AND CORONARY ANGIOGRAPHY N/A 01/02/2021   Procedure: RIGHT/LEFT HEART CATH AND CORONARY ANGIOGRAPHY;  Surgeon: Court Dorn PARAS, MD;  Location: [facility] INVASIVE CV LAB;  Service: Cardiovascular;  Laterality: N/A;    Social History:   reports that he has been smoking cigars. He has been exposed to tobacco smoke. He has quit using smokeless tobacco.  His smokeless tobacco use included chew. He reports current alcohol use of about 11.0 standard drinks of alcohol per week. He reports current drug use. Drug: Marijuana.  Allergies  Allergen Reactions   Bee Venom Hives, Itching and Swelling   Metformin  Other (See Comments)    Made the kidneys weak- was stopped by a MD   Oatmeal Nausea And Vomiting    Family History  Problem Relation Age of Onset   Stroke Mother    Diabetes Mother        Toward end of life     Prior to Admission medications   Medication Sig Start Date End Date Taking? Authorizing Provider  aspirin  (ASPIRIN  LOW DOSE) 81 MG chewable tablet CHEW ONE TABLET BY MOUTH ONCE DAILY Needs appointment for  further refills Patient not taking: Reported on 11/10/2023 10/25/22   Danton Jon HERO, PA-C  atorvastatin  (LIPITOR) 10 MG tablet Take 1 tablet (10 mg total) by mouth every evening. Patient not taking: Reported on 11/10/2023 12/14/22   Vicci Barnie NOVAK, MD  empagliflozin  (JARDIANCE ) 10 MG TABS tablet Take 1 tablet (10 mg total) by mouth daily at 12 noon. Patient not taking: Reported on 11/10/2023 12/14/22   Vicci Barnie NOVAK, MD  ferrous sulfate  (FEROSUL) 325 (65 FE) MG tablet TAKE ONE TABLET BY MOUTH EVERY MORNING WITH BREAKFAST Patient not taking: Reported on 11/10/2023 08/15/22   Vicci Barnie NOVAK, MD  furosemide  (LASIX ) 20 MG tablet Take 1 tablet (20 mg total) by mouth daily for 7 days. 05/04/24 05/11/24  Tonia Chew, MD  gabapentin  (NEURONTIN ) 300 MG capsule Take 1 capsule (300 mg total) by mouth 2 (two) times daily. Patient not taking: Reported on 11/10/2023 01/17/23   Vicci Barnie NOVAK, MD  glimepiride  (AMARYL ) 1 MG tablet Take 2 tablets (2 mg total) by mouth daily with breakfast. Patient not taking: Reported on 11/10/2023 04/17/23   McClung, Angela M, PA-C  isosorbide -hydrALAZINE  (BIDIL ) 20-37.5 MG tablet Take 1 tablet by mouth 2 (two) times daily. Patient not taking: Reported on 11/10/2023 12/14/22   Vicci Barnie NOVAK, MD  torsemide  (DEMADEX ) 10 MG tablet Take 1 tablet (10 mg total) by mouth daily. Patient not taking: Reported on 11/10/2023 04/10/23   Jillian Buttery, MD    Physical Exam: Vitals:   05/16/24 2245 05/16/24 2300 05/16/24 2315 05/16/24 2330  BP: (!) 168/75 (!) 181/87 (!) 188/82 (!) 175/98  Pulse:      Resp: 12 13 16 15   Temp:      TempSrc:      SpO2:    98%  Weight:        Constitutional: NAD, no pallor or diaphoresis   Eyes: PERTLA, lids and conjunctivae normal ENMT: Mucous membranes are moist. Posterior pharynx clear of any exudate or lesions.   Neck: supple, no masses  Respiratory: no wheezing, no crackles. No accessory muscle use.  Cardiovascular: S1 & S2 heard, regular  rate and rhythm. Bilateral BKA stump edema.  Abdomen: No tenderness, soft. Bowel sounds active.  Musculoskeletal: no clubbing / cyanosis. S/p b/l BKAs.   Skin: no significant rashes, lesions, ulcers. Warm, dry, well-perfused. Neurologic: CN 2-12 grossly intact. Moving all extremities. Alert and oriented.  Psychiatric: Calm. Intermittently cooperative.    Labs and Imaging on Admission: I have personally reviewed following labs and imaging studies  CBC: Recent Labs  Lab 05/16/24 2057  WBC 6.1  HGB 9.0*  HCT 28.7*  MCV 88.6  PLT 254   Basic Metabolic Panel: Recent Labs  Lab 05/16/24 2057  NA 139  K 4.0  CL 105  CO2 26  GLUCOSE 172*  BUN 27*  CREATININE 2.49*  CALCIUM  8.6*   GFR: Estimated Creatinine  Clearance: 35.3 mL/min (A) (by C-G formula based on SCr of 2.49 mg/dL (H)). Liver Function Tests: No results for input(s): AST, ALT, ALKPHOS, BILITOT, PROT, ALBUMIN  in the last 168 hours. No results for input(s): LIPASE, AMYLASE in the last 168 hours. No results for input(s): AMMONIA in the last 168 hours. Coagulation Profile: No results for input(s): INR, PROTIME in the last 168 hours. Cardiac Enzymes: No results for input(s): CKTOTAL, CKMB, CKMBINDEX, TROPONINI in the last 168 hours. BNP (last 3 results) No results for input(s): PROBNP in the last 8760 hours. HbA1C: No results for input(s): HGBA1C in the last 72 hours. CBG: No results for input(s): GLUCAP in the last 168 hours. Lipid Profile: No results for input(s): CHOL, HDL, LDLCALC, TRIG, CHOLHDL, LDLDIRECT in the last 72 hours. Thyroid  Function Tests: No results for input(s): TSH, T4TOTAL, FREET4, T3FREE, THYROIDAB in the last 72 hours. Anemia Panel: No results for input(s): VITAMINB12, FOLATE, FERRITIN, TIBC, IRON, RETICCTPCT in the last 72 hours. Urine analysis:    Component Value Date/Time   COLORURINE YELLOW 03/16/2024 0622    APPEARANCEUR CLEAR 03/16/2024 0622   LABSPEC 1.015 03/16/2024 0622   PHURINE 6.0 03/16/2024 0622   GLUCOSEU 50 (A) 03/16/2024 0622   HGBUR NEGATIVE 03/16/2024 0622   BILIRUBINUR NEGATIVE 03/16/2024 0622   BILIRUBINUR negative 10/30/2022 1405   KETONESUR NEGATIVE 03/16/2024 0622   PROTEINUR >=300 (A) 03/16/2024 0622   UROBILINOGEN 0.2 10/30/2022 1405   NITRITE NEGATIVE 03/16/2024 0622   LEUKOCYTESUR NEGATIVE 03/16/2024 0622   Sepsis Labs: @LABRCNTIP (procalcitonin:4,lacticidven:4) )No results found for this or any previous visit (from the past 240 hours).   Radiological Exams on Admission: DG Chest Port 1 View Result Date: 05/16/2024 CLINICAL DATA:  Lower extremity edema EXAM: PORTABLE CHEST 1 VIEW COMPARISON:  03/16/2024 FINDINGS: Cardiomegaly. Both lungs are clear. The visualized skeletal structures are unremarkable. IMPRESSION: Cardiomegaly without acute abnormality of the lungs. Electronically Signed   By: Marolyn JONETTA Jaksch M.D.   On: 05/16/2024 20:41    EKG: Independently reviewed. Sinus rhythm, PAC.   Assessment/Plan   1. AKI superimposed on CKD 3B  - Check UA and urine chemistries, hold diuretics and Jardiance , continue IVF hydration, repeat serum chemistries in am    2. Leg swelling  - Patient has been complaining of b/l BKA stump pain and swelling for months  - Does not appear to be in acute CHF, suspect this is dependent edema d/t spending majority of his time in wheelchair  - Elevated stumps while at rest, monitor    3. Chronic HFpEF  - Appears compensated    4. Type II DM  - Check CBGs and use low-intensity SSI for now    5. Anemia  - Appears stable, no overt bleeding    DVT prophylaxis: sq heparin   Code Status: Full  Level of Care: Level of care: Med-Surg Family Communication: None present  Disposition Plan:  Patient is from: Home  Anticipated d/c is to: home  Anticipated d/c date is: possibly as early as 7/13 or 7/14 Patient currently: Pending improved  renal function  Consults called: None  Admission status: Observation     Evalene GORMAN Sprinkles, MD [facility]  05/16/2024, 11:51 PM

## 2024-05-16 NOTE — ED Provider Notes (Signed)
 [place] EMERGENCY DEPARTMENT AT [facility] Provider Note   CSN: [id number] Arrival date & time: 05/16/24  8077     Patient presents with: Leg Pain and Leg Swelling   Scott Greer is a 68 y.o. male.   68 year old male with past medical history of CHF and bilateral BKA's presenting to the emergency department today with leg pain and swelling.  This seems to be more of a chronic issue looking back through the patient's chart.  He does admit that this been going now for months.  He states that he has had some swelling in his legs.  Denies any associated shortness of breath.  He does have a history of CHF.  I do see that he was seen here last week and was prescribed some Lasix .  Reports that this did not do much for his symptoms.  He came to the ER today for further evaluation regarding this.  Unfortunately, the patient is homeless and wheelchair-bound at baseline which does make things difficult.  He denies any associated chest pain.   Leg Pain      Prior to Admission medications   Medication Sig Start Date End Date Taking? Authorizing Provider  aspirin  (ASPIRIN  LOW DOSE) 81 MG chewable tablet CHEW ONE TABLET BY MOUTH ONCE DAILY Needs appointment for further refills Patient not taking: Reported on 11/10/2023 10/25/22   Danton Jon HERO, PA-C  atorvastatin  (LIPITOR) 10 MG tablet Take 1 tablet (10 mg total) by mouth every evening. Patient not taking: Reported on 11/10/2023 12/14/22   Vicci Barnie NOVAK, MD  empagliflozin  (JARDIANCE ) 10 MG TABS tablet Take 1 tablet (10 mg total) by mouth daily at 12 noon. Patient not taking: Reported on 11/10/2023 12/14/22   Vicci Barnie NOVAK, MD  ferrous sulfate  (FEROSUL) 325 (65 FE) MG tablet TAKE ONE TABLET BY MOUTH EVERY MORNING WITH BREAKFAST Patient not taking: Reported on 11/10/2023 08/15/22   Vicci Barnie NOVAK, MD  furosemide  (LASIX ) 20 MG tablet Take 1 tablet (20 mg total) by mouth daily for 7 days. 05/04/24 05/11/24  Tonia Chew, MD   gabapentin  (NEURONTIN ) 300 MG capsule Take 1 capsule (300 mg total) by mouth 2 (two) times daily. Patient not taking: Reported on 11/10/2023 01/17/23   Vicci Barnie NOVAK, MD  glimepiride  (AMARYL ) 1 MG tablet Take 2 tablets (2 mg total) by mouth daily with breakfast. Patient not taking: Reported on 11/10/2023 04/17/23   McClung, Angela M, PA-C  isosorbide -hydrALAZINE  (BIDIL ) 20-37.5 MG tablet Take 1 tablet by mouth 2 (two) times daily. Patient not taking: Reported on 11/10/2023 12/14/22   Vicci Barnie NOVAK, MD  torsemide  (DEMADEX ) 10 MG tablet Take 1 tablet (10 mg total) by mouth daily. Patient not taking: Reported on 11/10/2023 04/10/23   Jillian Buttery, MD    Allergies: Bee venom, Metformin , and Oatmeal    Review of Systems  Cardiovascular:  Positive for leg swelling.  All other systems reviewed and are negative.   Updated Vital Signs BP (!) 168/75 (BP Location: Left Arm)   Pulse 90   Temp 98.3 F (36.8 C) (Oral)   Resp 18   Wt 90.7 kg   SpO2 98%   BMI 25.00 kg/m   Physical Exam Vitals and nursing note reviewed.   Gen: NAD Eyes: PERRL, EOMI HEENT: no oropharyngeal swelling Neck: trachea midline Resp: clear to auscultation bilaterally Card: RRR, no murmurs, rubs, or gallops Abd: nontender, nondistended Extremities: Bilateral BKA's noted, there is trace edema noted bilaterally, no overlying erythema or warmth noted Vascular:  2+ radial pulses bilaterally, 2+ DP pulses bilaterally Skin: no rashes Psyc: acting appropriately   (all labs ordered are listed, but only abnormal results are displayed) Labs Reviewed  BASIC METABOLIC PANEL WITH GFR - Abnormal; Notable for the following components:      Result Value   Glucose, Bld 172 (*)    BUN 27 (*)    Creatinine, Ser 2.49 (*)    Calcium  8.6 (*)    GFR, Estimated 28 (*)    All other components within normal limits  CBC - Abnormal; Notable for the following components:   RBC 3.24 (*)    Hemoglobin 9.0 (*)    HCT 28.7 (*)    RDW  15.8 (*)    All other components within normal limits  BRAIN NATRIURETIC PEPTIDE    EKG: None  Radiology: [facility] Chest Port 1 View Result Date: 05/16/2024 CLINICAL DATA:  Lower extremity edema EXAM: PORTABLE CHEST 1 VIEW COMPARISON:  03/16/2024 FINDINGS: Cardiomegaly. Both lungs are clear. The visualized skeletal structures are unremarkable. IMPRESSION: Cardiomegaly without acute abnormality of the lungs. Electronically Signed   By: Marolyn JONETTA Jaksch M.D.   On: 05/16/2024 20:41     Procedures   Medications Ordered in the ED  lactated ringers  bolus 1,000 mL (has no administration in time range)  acetaminophen  (TYLENOL ) tablet 1,000 mg (1,000 mg Oral Given 05/16/24 2200)                                    Medical Decision Making 68 year old male with past medical history of diabetes and hypertension presenting to the emergency department today with bilateral lower extremity edema and chronic pain.  I will further evaluate the patient here with basic labs as well as a chest x-ray to evaluate for for pulmonary edema.  Will give the patient Tylenol  for pain.  Based on description and duration of his symptoms suspicion for DVT is low this time.  Exam not consistent with cellulitis.  The patient does have a history of CHF.  I will give him an increased dose of Lasix  if his BMP is within normal limits.  The patient's BNP is within normal limits.  Does appear to have an AKI with a creatinine of 2.5 from baseline of less than 2.  On reassessment patient does have some lower extremity swelling but does not have any pitting edema to his abdomen or upper thighs.  Suspect this is likely dependent from him being wheelchair-bound and is wheelchair close to 24 hours a day.  The patient is given IV fluids here.  Calls placed to hospitalist service for admission for IV fluids for the AKI.  Amount and/or Complexity of Data Reviewed Labs: ordered. Radiology: ordered.  Risk OTC drugs. Decision regarding  hospitalization.        Final diagnoses:  AKI (acute kidney injury) [facility])    ED Discharge Orders     None          Ula Prentice SAUNDERS, MD 05/16/24 2311

## 2024-05-16 NOTE — ED Provider Notes (Signed)
 [facility] Emergency Department Provider Note MRN:  [id number]  Arrival date & time: 05/16/24     Chief Complaint   Leg Pain   History of Present Illness   Scott Greer is a 68 y.o. year-old male presents to the ED with chief complaint of reported leg swelling.  He does not offer any history to me.  He repeatedly tells me that he just wants to sleep and to be left alone.  Refuses to provide further history.  History provided by patient.   Review of Systems  Pertinent positive and negative review of systems noted in HPI.    Physical Exam   Vitals:   05/16/24 0103 05/16/24 0111  BP: (!) 196/95   Pulse: 84   Resp: 16   Temp: 97.6 F (36.4 C)   SpO2: 100% 100%    CONSTITUTIONAL:  non toxic-appearing, NAD NEURO:  Alert and oriented x 3, CN 3-12 grossly intact EYES:  eyes equal and reactive ENT/NECK:  Supple, no stridor  CARDIO:  normal rate, appears well-perfused  PULM:  No respiratory distress,  GI/GU:  non-distended,  MSK/SPINE:  No gross deformities, no edema, moves all extremities, bilateral BKAs, no excessive swelling, heat, or erythema to suggest infection SKIN:  no rash, atraumatic   *Additional and/or pertinent findings included in MDM below  Diagnostic and Interventional Summary    EKG Interpretation Date/Time:    Ventricular Rate:    PR Interval:    QRS Duration:    QT Interval:    QTC Calculation:   R Axis:      Text Interpretation:         Labs Reviewed - No data to display  No orders to display    Medications - No data to display   Procedures  /  Critical Care Procedures  ED Course and Medical Decision Making  I have reviewed the triage vital signs, the nursing notes, and pertinent available records from the EMR.  Social Determinants Affecting Complexity of Care: Patient has no clinically significant social determinants affecting this chief complaint..   ED Course:    Medical Decision Making Triage  complaint is for bilateral leg swelling of his BKA's, I do not note any significant swelling on my exam.  There is no erythema.  No evidence of infection or trauma.  Patient refuses to give me any history.  He states that he just wants somewhere to sleep.  I told him that I cannot keep him here for this and that if he does not have any medical complaints that I will need to discharge him.  Patient replies I do not care what you do.         Consultants: No consultations were needed in caring for this patient.   Treatment and Plan: Emergency department workup does not suggest an emergent condition requiring admission or immediate intervention beyond  what has been performed at this time. The patient is safe for discharge and has  been instructed to return immediately for worsening symptoms, change in  symptoms or any other concerns    Final Clinical Impressions(s) / ED Diagnoses     ICD-10-CM   1. Chronic pain of both lower extremities  M79.604    M79.605    G89.29       ED Discharge Orders     None         Discharge Instructions Discussed with and Provided to Patient:   Discharge Instructions   None  Vicky Charleston, PA-C 05/16/24 0248    Trine Raynell Moder, MD 05/16/24 [phone]

## 2024-05-17 ENCOUNTER — Observation Stay (HOSPITAL_COMMUNITY)

## 2024-05-17 ENCOUNTER — Encounter (HOSPITAL_COMMUNITY): Payer: Self-pay | Admitting: Family Medicine

## 2024-05-17 DIAGNOSIS — Z993 Dependence on wheelchair: Secondary | ICD-10-CM | POA: Diagnosis not present

## 2024-05-17 DIAGNOSIS — Z833 Family history of diabetes mellitus: Secondary | ICD-10-CM | POA: Diagnosis not present

## 2024-05-17 DIAGNOSIS — E785 Hyperlipidemia, unspecified: Secondary | ICD-10-CM | POA: Diagnosis present

## 2024-05-17 DIAGNOSIS — N1832 Chronic kidney disease, stage 3b: Secondary | ICD-10-CM | POA: Diagnosis present

## 2024-05-17 DIAGNOSIS — Z91030 Bee allergy status: Secondary | ICD-10-CM | POA: Diagnosis not present

## 2024-05-17 DIAGNOSIS — N179 Acute kidney failure, unspecified: Secondary | ICD-10-CM | POA: Diagnosis present

## 2024-05-17 DIAGNOSIS — G8929 Other chronic pain: Secondary | ICD-10-CM | POA: Diagnosis present

## 2024-05-17 DIAGNOSIS — Z91018 Allergy to other foods: Secondary | ICD-10-CM | POA: Diagnosis not present

## 2024-05-17 DIAGNOSIS — Z888 Allergy status to other drugs, medicaments and biological substances status: Secondary | ICD-10-CM | POA: Diagnosis not present

## 2024-05-17 DIAGNOSIS — E1122 Type 2 diabetes mellitus with diabetic chronic kidney disease: Secondary | ICD-10-CM | POA: Diagnosis present

## 2024-05-17 DIAGNOSIS — D631 Anemia in chronic kidney disease: Secondary | ICD-10-CM | POA: Diagnosis present

## 2024-05-17 DIAGNOSIS — I130 Hypertensive heart and chronic kidney disease with heart failure and stage 1 through stage 4 chronic kidney disease, or unspecified chronic kidney disease: Secondary | ICD-10-CM | POA: Diagnosis present

## 2024-05-17 DIAGNOSIS — F17290 Nicotine dependence, other tobacco product, uncomplicated: Secondary | ICD-10-CM | POA: Diagnosis present

## 2024-05-17 DIAGNOSIS — Z89511 Acquired absence of right leg below knee: Secondary | ICD-10-CM | POA: Diagnosis not present

## 2024-05-17 DIAGNOSIS — Z89512 Acquired absence of left leg below knee: Secondary | ICD-10-CM | POA: Diagnosis not present

## 2024-05-17 DIAGNOSIS — H409 Unspecified glaucoma: Secondary | ICD-10-CM | POA: Diagnosis present

## 2024-05-17 DIAGNOSIS — Z823 Family history of stroke: Secondary | ICD-10-CM | POA: Diagnosis not present

## 2024-05-17 DIAGNOSIS — I5032 Chronic diastolic (congestive) heart failure: Secondary | ICD-10-CM | POA: Diagnosis present

## 2024-05-17 DIAGNOSIS — Z91199 Patient's noncompliance with other medical treatment and regimen due to unspecified reason: Secondary | ICD-10-CM | POA: Diagnosis not present

## 2024-05-17 DIAGNOSIS — Z5900 Homelessness unspecified: Secondary | ICD-10-CM | POA: Diagnosis not present

## 2024-05-17 LAB — URINALYSIS, COMPLETE (UACMP) WITH MICROSCOPIC
Bilirubin Urine: NEGATIVE
Glucose, UA: 50 mg/dL — AB
Hgb urine dipstick: NEGATIVE
Ketones, ur: NEGATIVE mg/dL
Leukocytes,Ua: NEGATIVE
Nitrite: NEGATIVE
Protein, ur: 100 mg/dL — AB
Specific Gravity, Urine: 1.013 (ref 1.005–1.030)
pH: 5 (ref 5.0–8.0)

## 2024-05-17 LAB — RAPID URINE DRUG SCREEN, HOSP PERFORMED
Amphetamines: NOT DETECTED
Barbiturates: NOT DETECTED
Benzodiazepines: NOT DETECTED
Cocaine: POSITIVE — AB
Opiates: NOT DETECTED
Tetrahydrocannabinol: NOT DETECTED

## 2024-05-17 LAB — CBC
HCT: 28.3 % — ABNORMAL LOW (ref 39.0–52.0)
Hemoglobin: 9 g/dL — ABNORMAL LOW (ref 13.0–17.0)
MCH: 28 pg (ref 26.0–34.0)
MCHC: 31.8 g/dL (ref 30.0–36.0)
MCV: 88.2 fL (ref 80.0–100.0)
Platelets: 257 K/uL (ref 150–400)
RBC: 3.21 MIL/uL — ABNORMAL LOW (ref 4.22–5.81)
RDW: 15.6 % — ABNORMAL HIGH (ref 11.5–15.5)
WBC: 5 K/uL (ref 4.0–10.5)
nRBC: 0 % (ref 0.0–0.2)

## 2024-05-17 LAB — GLUCOSE, CAPILLARY
Glucose-Capillary: 152 mg/dL — ABNORMAL HIGH (ref 70–99)
Glucose-Capillary: 187 mg/dL — ABNORMAL HIGH (ref 70–99)
Glucose-Capillary: 158 mg/dL — ABNORMAL HIGH (ref 70–99)
Glucose-Capillary: 170 mg/dL — ABNORMAL HIGH (ref 70–99)

## 2024-05-17 LAB — BASIC METABOLIC PANEL WITH GFR
Anion gap: 6 (ref 5–15)
BUN: 25 mg/dL — ABNORMAL HIGH (ref 8–23)
CO2: 24 mmol/L (ref 22–32)
Calcium: 8.5 mg/dL — ABNORMAL LOW (ref 8.9–10.3)
Chloride: 108 mmol/L (ref 98–111)
Creatinine, Ser: 2.18 mg/dL — ABNORMAL HIGH (ref 0.61–1.24)
GFR, Estimated: 33 mL/min — ABNORMAL LOW (ref >60)
Glucose, Bld: 158 mg/dL — ABNORMAL HIGH (ref 70–99)
Potassium: 4 mmol/L (ref 3.5–5.1)
Sodium: 138 mmol/L (ref 135–145)

## 2024-05-17 LAB — SODIUM, URINE, RANDOM: Sodium, Ur: 108 mmol/L

## 2024-05-17 LAB — CREATININE, URINE, RANDOM: Creatinine, Urine: 100 mg/dL

## 2024-05-17 LAB — HIV ANTIBODY (ROUTINE TESTING W REFLEX): HIV Screen 4th Generation wRfx: NONREACTIVE

## 2024-05-17 MED ORDER — SODIUM CHLORIDE 0.9 % IV SOLN: INTRAVENOUS | Status: DC

## 2024-05-17 NOTE — TOC Initial Note (Signed)
 Transition of Care [facility]) - Initial/Assessment Note    Patient Details  Name: Scott Greer MRN: [id number] Date of Birth: [birth year]  [facility]) CM/SW Contact:    Sonda Manuella Quill, RN Phone Number: 05/17/2024, 3:07 PM  Clinical Narrative:                 TOC for SA, homelessness, and SDOH risks; spoke w/ pt in room; pt says he lives in a motel until his money runs out, then he lives in the streets; he is not sure where he will go at d/c; he identified POC ex-wife Darrick Catching [phone]); pt says he needs assistance w/ transportation; he also verified insurance/PCP; he denied IPV; pt has wheelchair; he does not receive HH services or home oxygen; pt agreed to receive resources for SA, transportation, social services, financial assistance, and shelters; resources placed in d/c instructions; copies of resources also given to pt; he will make his own appt at agencies of choice; awaiting PT eval; TOC is following.  Expected Discharge Plan: Home/Self Care Barriers to Discharge: Continued Medical Work up   Patient Goals and CMS Choice Patient states their goals for this hospitalization and ongoing recovery are:: home CMS Medicare.gov Compare Post Acute Care list provided to:: Patient   [place] ownership interest in [facility].provided to:: Patient    Expected Discharge Plan and Services   Discharge Planning Services: CM Consult   Living arrangements for the past 2 months: Homeless Shelter                                      Prior Living Arrangements/Services Living arrangements for the past 2 months: Homeless Shelter Lives with:: Self Patient language and need for interpreter reviewed:: Yes Do you feel safe going back to the place where you live?: Yes      Need for Family Participation in Patient Care: Yes (Comment) Care giver support system in place?: Yes (comment) Current home services: DME (wheelchair) Criminal Activity/Legal  Involvement Pertinent to Current Situation/Hospitalization: No - Comment as needed  Activities of Daily Living   ADL Screening (condition at time of admission) Independently performs ADLs?: No Does the patient have a NEW difficulty with bathing/dressing/toileting/self-feeding that is expected to last >3 days?: No Does the patient have a NEW difficulty with getting in/out of bed, walking, or climbing stairs that is expected to last >3 days?: No Does the patient have a NEW difficulty with communication that is expected to last >3 days?: No Is the patient deaf or have difficulty hearing?: No Does the patient have difficulty seeing, even when wearing glasses/contacts?: No Does the patient have difficulty concentrating, remembering, or making decisions?: No  Permission Sought/Granted Permission sought to share information with : Case Manager Permission granted to share information with : Yes, Verbal Permission Granted  Share Information with NAME: Case Manager     Permission granted to share info w Relationship: Darrick Catching (ex-wife) [phone]     Emotional Assessment Appearance:: Appears stated age Attitude/Demeanor/Rapport: Gracious Affect (typically observed): Accepting Orientation: : Oriented to Self, Oriented to Place, Oriented to  Time, Oriented to Situation Alcohol / Substance Use: Not Applicable Psych Involvement: No (comment)  Admission diagnosis:  AKI (acute kidney injury) (HCC) [N17.9] Acute renal failure superimposed on stage 3b chronic kidney disease (HCC) [N17.9, N18.32] Patient Active Problem List   Diagnosis Date Noted   Acute renal failure superimposed  on stage 3b chronic kidney disease (HCC) 05/16/2024   Leg swelling 05/16/2024   PDR (proliferative diabetic retinopathy) (HCC) 04/10/2023   Generalized weakness 04/09/2023   Dehydration 04/09/2023   Fall at home, initial encounter 04/09/2023   Acute cystitis 04/09/2023   History of anemia due to chronic kidney  disease 04/09/2023   Acute renal failure superimposed on stage 3a chronic kidney disease (HCC) 04/08/2023   Encounter for power mobility device assessment 01/03/2023   Chronic diastolic CHF (congestive heart failure) (HCC) 12/01/2021   S/P BKA (below knee amputation) bilateral (HCC) 07/18/2021   Gangrene of right foot (HCC)    Foot pain, right 04/14/2021   Status post below-knee amputation (HCC) 02/17/2021   Wound dehiscence    Gangrene of toe of left foot (HCC)    Cutaneous abscess of left foot    Left foot infection 02/07/2021   Leukocytosis 02/07/2021   Thrombocytosis 02/07/2021   Hyponatremia 02/07/2021   AKI (acute kidney injury) (HCC) 02/07/2021   Hyperglycemia due to diabetes mellitus (HCC) 02/07/2021   New onset of congestive heart failure (HCC) 12/26/2020   CKD (chronic kidney disease) stage 3, GFR 30-59 ml/min (HCC) 12/26/2020   Acute systolic CHF (congestive heart failure) (HCC) 12/26/2020   Anemia, chronic disease 06/22/2020   Amputee, great toe, right (HCC) 06/20/2020   Normocytic anemia 06/20/2020   Erectile dysfunction associated with type 2 diabetes mellitus (HCC) 06/20/2020   Positive for macroalbuminuria 10/24/2019   Amputation of left great toe (HCC) 10/23/2019   Tobacco dependence 10/23/2019   Diabetic foot infection (HCC)    Noncompliance    Glaucoma    Hyperlipidemia    Essential hypertension 11/06/2003   DM2 (diabetes mellitus, type 2) (HCC) 11/05/1997   PCP:  Vicci Barnie NOVAK, MD Pharmacy:   [name] [address] [place] [address] Phone: [phone] Fax: [phone]     Social Drivers of Health (SDOH) Social History: SDOH Screenings   Food Insecurity: No Food Insecurity (05/17/2024)  Housing: High Risk (05/17/2024)  Transportation Needs: Unmet Transportation Needs (05/17/2024)  Utilities: Not At Risk (05/17/2024)  Recent Concern: Utilities - At Risk (05/17/2024)  Alcohol Screen: Low Risk  (10/18/2021)   Depression (PHQ2-9): Medium Risk (12/14/2020)  Financial Resource Strain: High Risk (10/18/2021)  Physical Activity: Inactive (12/11/2021)  Social Connections: Patient Declined (05/16/2024)  Stress: Stress Concern Present (02/27/2022)  Tobacco Use: High Risk (05/16/2024)   SDOH Interventions: Food Insecurity Interventions: Intervention Not Indicated, Inpatient TOC Housing Interventions: Programmer, applications Provided, Inpatient TOC Transportation Interventions: Community Resources Provided, Inpatient TOC Utilities Interventions: Intervention Not Indicated, Inpatient TOC   Readmission Risk Interventions     No data to display

## 2024-05-17 NOTE — Progress Notes (Signed)
  Echocardiogram 2D Echocardiogram has been attempted. Pt was eating. Explained that if he doesn't get it done now it would be pushed to tomorrow. Patient said That's what's gonna have to happen   Tinnie FORBES Gosling RDCS 05/17/2024, 1:57 PM

## 2024-05-17 NOTE — Plan of Care (Signed)

## 2024-05-17 NOTE — Care Management Obs Status (Signed)
 MEDICARE OBSERVATION STATUS NOTIFICATION   Patient Details  Name: ZIMRI BRENNEN MRN: [id number] Date of Birth: Dec 10, 1955   Medicare Observation Status Notification Given:  Yes    Sonda Manuella Quill, RN 05/17/2024, 11:40 AM

## 2024-05-17 NOTE — Progress Notes (Signed)
 PROGRESS NOTE    Scott Greer  FMW:[id number] DOB: May 31, 1956 DOA: 05/16/2024 PCP: Vicci Barnie NOVAK, MD   Brief Narrative:  68 y.o. male with medical history significant for hypertension, hyperlipidemia, type 2 diabetes mellitus, chronic HFpEF, polysubstance abuse, noncompliance and bilateral BKAs presented with bilateral BKA stump pain and swelling.  On presentation, creatinine was 2.49 with normal WBC and normal BNP.  Chest x-ray showed no acute findings.  He was started on IV fluids.  Assessment & Plan:   AKI on CKD stage IIIb - Baseline creatinine of 1.6-1.8.  Presented with creatinine of 2.49.  Improving to 2.18.  Continue IV fluids.  Monitor - Diuretics and Jardiance  on hold.  Bilateral BKA stump pain and swelling -Questionable cause.  Suspect this is dependent edema due to spending majority of his time in his wheelchair.  Patient does not follow-up with orthopedics as an outpatient.  Did recommend compliance with outpatient follow-up. - Continue pain management. - Elevate stumps while at rest.  Homelessness - TOC consult  Noncompliance - Counseled regarding the same  Chronic diastolic heart failure - Currently compensated.  Strict input and output.  Daily weights.  Diuretics on hold for now.  Repeat 2D echo.  Anemia of chronic disease - From chronic kidney disease.  Hemoglobin stable.  No signs of bleeding.  Monitor intermittently  Diabetes mellitus type 2 - Continue CBG with SSI.  Carb modified diet  Physical deconditioning - PT eval.  Patient states that he cannot take care of himself and is hoping to go to rehab.   DVT prophylaxis: Subcutaneous heparin  Code Status: Full Family Communication: None at bedside Disposition Plan: Status is: Observation The patient will require care spanning > 2 midnights and should be moved to inpatient because: Of severity of illness.  Need for IV fluids.    Consultants: None  Procedures: None  Antimicrobials:  None   Subjective: Patient seen and examined at bedside.  Does not feel well.  Still complains of bilateral stump pain and swelling.  Denies any abdominal pain or chest pain.  No fever or vomiting reported.  Objective: Vitals:   05/17/24 0027 05/17/24 0200 05/17/24 0400 05/17/24 0434  BP: (!) 161/93   (!) 178/91  Pulse: 86   93  Resp: 18 17 15 20   Temp: (!) 97.5 F (36.4 C)   98.6 F (37 C)  TempSrc: Oral   Oral  SpO2: 100%   100%  Weight:      Height:        Intake/Output Summary (Last 24 hours) at 05/17/2024 1148 Last data filed at 05/17/2024 0735 Gross per 24 hour  Intake 1479 ml  Output 650 ml  Net 829 ml   Filed Weights   05/16/24 1948 05/17/24 0022  Weight: 90.7 kg 87.5 kg    Examination:  General exam: Appears calm and comfortable.  On room air.  Looks chronically ill and deconditioned. Respiratory system: Bilateral decreased breath sounds at bases with scattered crackles Cardiovascular system: S1 & S2 heard, Rate controlled Gastrointestinal system: Abdomen is nondistended, soft and nontender. Normal bowel sounds heard. Extremities: Bilateral BKA with some swelling around the stump but no erythema, discharge or wound  Central nervous system: Alert and oriented.  Slow to respond.  Poor historian.  No focal neurological deficits. Moving extremities Skin: No rashes, lesions or ulcers Psychiatry: Intermittently gets angry.  Affect is mostly flat otherwise    Data Reviewed: I have personally reviewed following labs and imaging studies  CBC: Recent Labs  Lab 05/16/24 2057 05/17/24 0752  WBC 6.1 5.0  HGB 9.0* 9.0*  HCT 28.7* 28.3*  MCV 88.6 88.2  PLT 254 257   Basic Metabolic Panel: Recent Labs  Lab 05/16/24 2057 05/17/24 0752  NA 139 138  K 4.0 4.0  CL 105 108  CO2 26 24  GLUCOSE 172* 158*  BUN 27* 25*  CREATININE 2.49* 2.18*  CALCIUM  8.6* 8.5*   GFR: Estimated Creatinine Clearance: 40.4 mL/min (A) (by C-G formula based on SCr of 2.18 mg/dL  (H)). Liver Function Tests: No results for input(s): AST, ALT, ALKPHOS, BILITOT, PROT, ALBUMIN  in the last 168 hours. No results for input(s): LIPASE, AMYLASE in the last 168 hours. No results for input(s): AMMONIA in the last 168 hours. Coagulation Profile: No results for input(s): INR, PROTIME in the last 168 hours. Cardiac Enzymes: No results for input(s): CKTOTAL, CKMB, CKMBINDEX, TROPONINI in the last 168 hours. BNP (last 3 results) No results for input(s): PROBNP in the last 8760 hours. HbA1C: No results for input(s): HGBA1C in the last 72 hours. CBG: Recent Labs  Lab 05/17/24 0730 05/17/24 1124  GLUCAP 152* 187*   Lipid Profile: No results for input(s): CHOL, HDL, LDLCALC, TRIG, CHOLHDL, LDLDIRECT in the last 72 hours. Thyroid  Function Tests: No results for input(s): TSH, T4TOTAL, FREET4, T3FREE, THYROIDAB in the last 72 hours. Anemia Panel: No results for input(s): VITAMINB12, FOLATE, FERRITIN, TIBC, IRON, RETICCTPCT in the last 72 hours. Sepsis Labs: No results for input(s): PROCALCITON, LATICACIDVEN in the last 168 hours.  No results found for this or any previous visit (from the past 240 hours).       Radiology Studies: DG Chest Port 1 View Result Date: 05/16/2024 CLINICAL DATA:  Lower extremity edema EXAM: PORTABLE CHEST 1 VIEW COMPARISON:  03/16/2024 FINDINGS: Cardiomegaly. Both lungs are clear. The visualized skeletal structures are unremarkable. IMPRESSION: Cardiomegaly without acute abnormality of the lungs. Electronically Signed   By: Marolyn JONETTA Jaksch M.D.   On: 05/16/2024 20:41        Scheduled Meds:  heparin   5,000 Units Subcutaneous Q8H   insulin  aspart  0-5 Units Subcutaneous QHS   insulin  aspart  0-6 Units Subcutaneous TID WC   Continuous Infusions:  sodium chloride  100 mL/hr at 05/17/24 9097          Sophie Mao, MD [facility] 05/17/2024, 11:48 AM

## 2024-05-18 ENCOUNTER — Inpatient Hospital Stay (HOSPITAL_COMMUNITY)

## 2024-05-18 DIAGNOSIS — N1832 Chronic kidney disease, stage 3b: Secondary | ICD-10-CM | POA: Diagnosis not present

## 2024-05-18 DIAGNOSIS — N179 Acute kidney failure, unspecified: Secondary | ICD-10-CM | POA: Diagnosis not present

## 2024-05-18 DIAGNOSIS — I5032 Chronic diastolic (congestive) heart failure: Secondary | ICD-10-CM

## 2024-05-18 LAB — CBC
HCT: 27.7 % — ABNORMAL LOW (ref 39.0–52.0)
Hemoglobin: 8.5 g/dL — ABNORMAL LOW (ref 13.0–17.0)
MCH: 27 pg (ref 26.0–34.0)
MCHC: 30.7 g/dL (ref 30.0–36.0)
MCV: 87.9 fL (ref 80.0–100.0)
Platelets: 250 K/uL (ref 150–400)
RBC: 3.15 MIL/uL — ABNORMAL LOW (ref 4.22–5.81)
RDW: 15.8 % — ABNORMAL HIGH (ref 11.5–15.5)
WBC: 5.8 K/uL (ref 4.0–10.5)
nRBC: 0 % (ref 0.0–0.2)

## 2024-05-18 LAB — ECHOCARDIOGRAM COMPLETE
AR max vel: 2.74 cm2
AV Area VTI: 2.58 cm2
AV Area mean vel: 2.72 cm2
AV Mean grad: 4 mmHg
AV Peak grad: 6.8 mmHg
Ao pk vel: 1.3 m/s
Area-P 1/2: 3.42 cm2
Calc EF: 50.1 %
Height: 75 in
S' Lateral: 4.1 cm
Single Plane A2C EF: 51.6 %
Single Plane A4C EF: 49.1 %
Weight: 3181.68 [oz_av]

## 2024-05-18 LAB — MAGNESIUM: Magnesium: 2 mg/dL (ref 1.7–2.4)

## 2024-05-18 LAB — BASIC METABOLIC PANEL WITH GFR
Anion gap: 7 (ref 5–15)
BUN: 25 mg/dL — ABNORMAL HIGH (ref 8–23)
CO2: 23 mmol/L (ref 22–32)
Calcium: 8.2 mg/dL — ABNORMAL LOW (ref 8.9–10.3)
Chloride: 108 mmol/L (ref 98–111)
Creatinine, Ser: 1.84 mg/dL — ABNORMAL HIGH (ref 0.61–1.24)
GFR, Estimated: 40 mL/min — ABNORMAL LOW (ref >60)
Glucose, Bld: 155 mg/dL — ABNORMAL HIGH (ref 70–99)
Potassium: 3.7 mmol/L (ref 3.5–5.1)
Sodium: 138 mmol/L (ref 135–145)

## 2024-05-18 LAB — HEMOGLOBIN A1C
Hgb A1c MFr Bld: 6.9 % — ABNORMAL HIGH (ref 4.8–5.6)
Mean Plasma Glucose: 151 mg/dL

## 2024-05-18 LAB — UREA NITROGEN, URINE: Urea Nitrogen, Ur: 514 mg/dL

## 2024-05-18 LAB — GLUCOSE, CAPILLARY
Glucose-Capillary: 156 mg/dL — ABNORMAL HIGH (ref 70–99)
Glucose-Capillary: 148 mg/dL — ABNORMAL HIGH (ref 70–99)
Glucose-Capillary: 157 mg/dL — ABNORMAL HIGH (ref 70–99)
Glucose-Capillary: 162 mg/dL — ABNORMAL HIGH (ref 70–99)

## 2024-05-18 MED ORDER — AMLODIPINE BESYLATE 10 MG PO TABS
10.0000 mg | ORAL_TABLET | Freq: Every day | ORAL | Status: DC
  Administered 2024-05-18 – 2024-05-19 (×2): 10 mg via ORAL
  Filled 2024-05-18 (×2): qty 1

## 2024-05-18 NOTE — Evaluation (Signed)
 Physical Therapy Evaluation Patient Details Name: Scott Greer MRN: [id number] DOB: 1956/01/28 Today's Date: 05/18/2024  History of Present Illness  Pt is 68 yo male admitted on 05/16/24 with AKI on CKD and bil BKA residual limb swelling.  Pt with hx including but not limited to HTN, HLD, DM2, CHF, polysubstance abuse, non-compliance, and bil BKA.  Clinical Impression  Pt admitted with above diagnosis.  Pt is homeless and stays at hotel or on streets.  Pt with bil BKA - reports has not wore prosthesis in months - states was working on walking with therapy at one point but has been a while.  Pt transfers independently to  w/c. He has a power chair but report it is broken -states he has called about repair but not sure of status. States he is getting calluses on his hand from pushing w/c and concerned about wounds/infection.  He had gloves at one point but no longer has.  Today, pt reports legs still painful but good improvement in edema. Increased time to explain therapy role and encourage pt in OOB activity.  Pt able to complete bed mobility and lateral scoot to recliner without difficulty, only needs supervision for lines.  Pt expressed interest in SNF at d/c; however, from a PT perspective pt is at baseline.  No further acute PT needs.  Pt does have needs due to social situation - notified TOC.  If able to get power w/c repaired would greatly improve his situation.  No PT recommendations for d/c unless pt wants to start outpt therapy working with prosthesis training.       If plan is discharge home, recommend the following: Assistance with cooking/housework;Help with stairs or ramp for entrance   Can travel by private vehicle        Equipment Recommendations Other (comment) (Power w/c needs repair; Manual w/c needs repair)  Recommendations for Other Services       Functional Status Assessment Patient has not had a recent decline in their functional status     Precautions / Restrictions  Precautions Precautions: Fall      Mobility  Bed Mobility Overal bed mobility: Needs Assistance Bed Mobility: Supine to Sit, Sit to Supine     Supine to sit: Modified independent (Device/Increase time) Sit to supine: Modified independent (Device/Increase time)   General bed mobility comments: use of momentum    Transfers Overall transfer level: Needs assistance   Transfers: Bed to chair/wheelchair/BSC            Lateral/Scoot Transfers: Supervision General transfer comment: supervision lines only    Ambulation/Gait                  Stairs            Wheelchair Mobility     Tilt Bed    Modified Rankin (Stroke Patients Only)       Balance Overall balance assessment: Modified Independent Sitting-balance support: No upper extremity supported Sitting balance-Leahy Scale: Good Sitting balance - Comments: Able to sit edge of chair and move arms                                     Pertinent Vitals/Pain Pain Assessment Pain Assessment: Faces Faces Pain Scale: Hurts a little bit Pain Location: bil legs Pain Descriptors / Indicators: Discomfort Pain Intervention(s): Monitored during session, Limited activity within patient's tolerance    Home Living Family/patient expects to  be discharged to:: Shelter/Homeless                   Additional Comments: Pt reports stays in hotels but when money runs out is on street.  Has a manual w/c and bil prosthesis.  Also reports power w/c but it is at ex-wife's house and is not working.  States he has called to get fixed but they said would be a while.  Pt has pressure relief cushion but it is soaking wet and stuck in plastic - reports has been wet for months.    Prior Function Prior Level of Function : Independent/Modified Independent             Mobility Comments: Reports has bil prosthesis but hasn't wore in a few months.  Has been transferring to w/c on his own but reports  difficulty pushing on road and up hills due to callus on hands.  He is worried callus will make open wound and get infected.  States he had gloves at one point but no longer has them. ADLs Comments: Pt reports typically independent with ADLs     Extremity/Trunk Assessment   Upper Extremity Assessment Upper Extremity Assessment: Overall WFL for tasks assessed    Lower Extremity Assessment Lower Extremity Assessment: LLE deficits/detail;RLE deficits/detail RLE Deficits / Details: Bil BKA.  Reports significant improvement in edema.  ROM overall WFL -does lack ~5 degrees knee ext.  MMT 5/5 LLE Deficits / Details: Bil BKA. Reports significant improvement in edema. ROM overall WFL -does lack ~5 degrees knee ext. MMT 5/5    Cervical / Trunk Assessment Cervical / Trunk Assessment: Normal  Communication        Cognition Arousal: Alert Behavior During Therapy: WFL for tasks assessed/performed   PT - Cognitive impairments: No apparent impairments                       PT - Cognition Comments: Decreased medical literacy         Cueing       General Comments General comments (skin integrity, edema, etc.): Calluses on bil hand from w/c    Exercises     Assessment/Plan    PT Assessment Patient does not need any further PT services  PT Problem List         PT Treatment Interventions      PT Goals (Current goals can be found in the Care Plan section)  Acute Rehab PT Goals Patient Stated Goal: get some help; go smoke PT Goal Formulation: All assessment and education complete, DC therapy    Frequency       Co-evaluation               AM-PAC PT 6 Clicks Mobility  Outcome Measure Help needed turning from your back to your side while in a flat bed without using bedrails?: None Help needed moving from lying on your back to sitting on the side of a flat bed without using bedrails?: None Help needed moving to and from a bed to a chair (including a  wheelchair)?: A Little Help needed standing up from a chair using your arms (e.g., wheelchair or bedside chair)?: Total (bil bka) Help needed to walk in hospital room?: Total Help needed climbing 3-5 steps with a railing? : Total 6 Click Score: 14    End of Session   Activity Tolerance: Patient tolerated treatment well Patient left: with chair alarm set;in chair;with call bell/phone within reach Nurse Communication:  Mobility status PT Visit Diagnosis: Other abnormalities of gait and mobility (R26.89);Muscle weakness (generalized) (M62.81)    Time: 1230-1306 PT Time Calculation (min) (ACUTE ONLY): 36 min   Charges:   PT Evaluation $PT Eval Low Complexity: 1 Low PT Treatments $Therapeutic Activity: 8-22 mins PT General Charges $$ ACUTE PT VISIT: 1 Visit         Benjiman, PT Acute Rehab [facility] [phone]   Benjiman VEAR Mulberry 05/18/2024, 1:48 PM

## 2024-05-18 NOTE — Progress Notes (Signed)
   05/18/24 1415  Spiritual Encounters  Type of Visit Initial  Care provided to: Patient  Referral source Clinical staff  Reason for visit Advance directives  OnCall Visit No   Per spiritual care consult request, I visited at length with [name].  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 He shared that he has two siblings, notably a sister who was working near [place], [employer], with whom he has lost contact. He has some contact with ex-wife but she has not been reliable as an advocate.  I visited with [name] initially to consider assistance with HCPOA but 1) ex-wife seems not his preferred health care agent 2) sister needs to be located. I offered compassionate presence and relational support, offering active listening to understand challenges especially over past year.  [name] would benefit from coordination of services. He has previously worked with [facility]) and receives some disability income but has inconsistently had access to shelters. He is struggling to maneuver in wheelchair due to pain and swelling of hands. Will continue to follow.  [name] [name], M.Div [phone]

## 2024-05-18 NOTE — Plan of Care (Signed)

## 2024-05-18 NOTE — Progress Notes (Signed)
 PROGRESS NOTE    Scott Greer  FMW:[id number] DOB: Apr 01, 1956 DOA: 05/16/2024 PCP: Vicci Barnie NOVAK, MD   Brief Narrative:  68 y.o. male with medical history significant for hypertension, hyperlipidemia, type 2 diabetes mellitus, chronic HFpEF, polysubstance abuse, noncompliance and bilateral BKAs presented with bilateral BKA stump pain and swelling.  On presentation, creatinine was 2.49 with normal WBC and normal BNP.  Chest x-ray showed no acute findings.  He was started on IV fluids.  Assessment & Plan:   AKI on CKD stage IIIb - Baseline creatinine of 1.6-1.8.  Presented with creatinine of 2.49.  Improving to 1.84.  Continue IV fluids.  Monitor - Diuretics and Jardiance  on hold.  Bilateral BKA stump pain and swelling -Questionable cause.  Suspect this is dependent edema due to spending majority of his time in his wheelchair.  Patient does not follow-up with orthopedics as an outpatient.  Patient needs to be compliant with outpatient follow-up. - Continue pain management. - Elevate stumps while at rest.  Homelessness - TOC consulted  Noncompliance - Counseled regarding the same  Chronic diastolic heart failure - Currently compensated.  Strict input and output.  Daily weights.  Diuretics on hold for now.  2D echo pending.  Anemia of chronic disease - From chronic kidney disease.  Hemoglobin stable.  No signs of bleeding.  Monitor intermittently  Diabetes mellitus type 2 - Continue CBG with SSI.  Carb modified diet  Physical deconditioning - PT eval.  Patient states that he cannot take care of himself and is hoping to go to rehab.   DVT prophylaxis: Subcutaneous heparin  Code Status: Full Family Communication: None at bedside Disposition Plan: Status is:  inpatient because: Of severity of illness.  Need for IV fluids.    Consultants: None  Procedures: None  Antimicrobials: None   Subjective: Patient seen and examined at bedside.  Continues to complain of  bilateral stump pain and swelling.  Denies worsening shortness breath, fever or vomiting. Objective: Vitals:   05/18/24 0155 05/18/24 0439 05/18/24 0500 05/18/24 0608  BP: (!) 175/79 (!) 189/92  (!) 173/96  Pulse: 74   69  Resp:      Temp:      TempSrc:      SpO2:      Weight:   90.2 kg   Height:        Intake/Output Summary (Last 24 hours) at 05/18/2024 0747 Last data filed at 05/18/2024 0500 Gross per 24 hour  Intake 652.13 ml  Output 1750 ml  Net -1097.87 ml   Filed Weights   05/16/24 1948 05/17/24 0022 05/18/24 0500  Weight: 90.7 kg 87.5 kg 90.2 kg    Examination:  General: On room air.  No distress.  Chronically ill and deconditioned looking. ENT/neck: No thyromegaly.  JVD is not elevated  respiratory: Decreased breath sounds at bases bilaterally with some crackles; no wheezing CVS: S1-S2 heard, rate controlled Abdominal: Soft, nontender, slightly distended; no organomegaly, normal bowel sounds are heard Extremities: Bilateral BKA with stump swelling present CNS: Awake and alert.  Remains slow to respond.  No focal neurologic deficit.  Moves extremities Lymph: No obvious lymphadenopathy Skin: No obvious ecchymosis/lesions  psych: Intermittently gets upset.  Mostly flat affect otherwise.    Data Reviewed: I have personally reviewed following labs and imaging studies  CBC: Recent Labs  Lab 05/16/24 2057 05/17/24 0752 05/18/24 0523  WBC 6.1 5.0 5.8  HGB 9.0* 9.0* 8.5*  HCT 28.7* 28.3* 27.7*  MCV 88.6 88.2 87.9  PLT  254 257 250   Basic Metabolic Panel: Recent Labs  Lab 05/16/24 2057 05/17/24 0752 05/18/24 0523  NA 139 138 138  K 4.0 4.0 3.7  CL 105 108 108  CO2 26 24 23   GLUCOSE 172* 158* 155*  BUN 27* 25* 25*  CREATININE 2.49* 2.18* 1.84*  CALCIUM  8.6* 8.5* 8.2*  MG  --   --  2.0   GFR: Estimated Creatinine Clearance: 47.8 mL/min (A) (by C-G formula based on SCr of 1.84 mg/dL (H)). Liver Function Tests: No results for input(s): AST, ALT,  ALKPHOS, BILITOT, PROT, ALBUMIN  in the last 168 hours. No results for input(s): LIPASE, AMYLASE in the last 168 hours. No results for input(s): AMMONIA in the last 168 hours. Coagulation Profile: No results for input(s): INR, PROTIME in the last 168 hours. Cardiac Enzymes: No results for input(s): CKTOTAL, CKMB, CKMBINDEX, TROPONINI in the last 168 hours. BNP (last 3 results) No results for input(s): PROBNP in the last 8760 hours. HbA1C: No results for input(s): HGBA1C in the last 72 hours. CBG: Recent Labs  Lab 05/17/24 0730 05/17/24 1124 05/17/24 1613 05/17/24 2038 05/18/24 0728  GLUCAP 152* 187* 158* 170* 156*   Lipid Profile: No results for input(s): CHOL, HDL, LDLCALC, TRIG, CHOLHDL, LDLDIRECT in the last 72 hours. Thyroid  Function Tests: No results for input(s): TSH, T4TOTAL, FREET4, T3FREE, THYROIDAB in the last 72 hours. Anemia Panel: No results for input(s): VITAMINB12, FOLATE, FERRITIN, TIBC, IRON, RETICCTPCT in the last 72 hours. Sepsis Labs: No results for input(s): PROCALCITON, LATICACIDVEN in the last 168 hours.  No results found for this or any previous visit (from the past 240 hours).       Radiology Studies: DG Chest Port 1 View Result Date: 05/16/2024 CLINICAL DATA:  Lower extremity edema EXAM: PORTABLE CHEST 1 VIEW COMPARISON:  03/16/2024 FINDINGS: Cardiomegaly. Both lungs are clear. The visualized skeletal structures are unremarkable. IMPRESSION: Cardiomegaly without acute abnormality of the lungs. Electronically Signed   By: Marolyn JONETTA Jaksch M.D.   On: 05/16/2024 20:41        Scheduled Meds:  heparin   5,000 Units Subcutaneous Q8H   insulin  aspart  0-5 Units Subcutaneous QHS   insulin  aspart  0-6 Units Subcutaneous TID WC   Continuous Infusions:  sodium chloride  100 mL/hr at 05/18/24 0740          Sophie Mao, MD [facility] 05/18/2024, 7:47 AM

## 2024-05-19 ENCOUNTER — Other Ambulatory Visit (HOSPITAL_BASED_OUTPATIENT_CLINIC_OR_DEPARTMENT_OTHER): Payer: Self-pay

## 2024-05-19 ENCOUNTER — Other Ambulatory Visit (HOSPITAL_COMMUNITY): Payer: Self-pay

## 2024-05-19 DIAGNOSIS — N179 Acute kidney failure, unspecified: Secondary | ICD-10-CM | POA: Diagnosis not present

## 2024-05-19 DIAGNOSIS — N1832 Chronic kidney disease, stage 3b: Secondary | ICD-10-CM | POA: Diagnosis not present

## 2024-05-19 LAB — BASIC METABOLIC PANEL WITH GFR
Anion gap: 5 (ref 5–15)
BUN: 22 mg/dL (ref 8–23)
CO2: 24 mmol/L (ref 22–32)
Calcium: 8.4 mg/dL — ABNORMAL LOW (ref 8.9–10.3)
Chloride: 107 mmol/L (ref 98–111)
Creatinine, Ser: 1.74 mg/dL — ABNORMAL HIGH (ref 0.61–1.24)
GFR, Estimated: 43 mL/min — ABNORMAL LOW (ref >60)
Glucose, Bld: 152 mg/dL — ABNORMAL HIGH (ref 70–99)
Potassium: 3.9 mmol/L (ref 3.5–5.1)
Sodium: 136 mmol/L (ref 135–145)

## 2024-05-19 LAB — CBC
HCT: 28.3 % — ABNORMAL LOW (ref 39.0–52.0)
Hemoglobin: 9.1 g/dL — ABNORMAL LOW (ref 13.0–17.0)
MCH: 28 pg (ref 26.0–34.0)
MCHC: 32.2 g/dL (ref 30.0–36.0)
MCV: 87.1 fL (ref 80.0–100.0)
Platelets: 242 K/uL (ref 150–400)
RBC: 3.25 MIL/uL — ABNORMAL LOW (ref 4.22–5.81)
RDW: 15.6 % — ABNORMAL HIGH (ref 11.5–15.5)
WBC: 7 K/uL (ref 4.0–10.5)
nRBC: 0 % (ref 0.0–0.2)

## 2024-05-19 LAB — GLUCOSE, CAPILLARY: Glucose-Capillary: 140 mg/dL — ABNORMAL HIGH (ref 70–99)

## 2024-05-19 LAB — MAGNESIUM: Magnesium: 1.9 mg/dL (ref 1.7–2.4)

## 2024-05-19 MED ORDER — OXYCODONE HCL 5 MG PO TABS
5.0000 mg | ORAL_TABLET | Freq: Four times a day (QID) | ORAL | 0 refills | Status: DC | PRN
  Filled 2024-05-19: qty 20, 5d supply, fill #0

## 2024-05-19 MED ORDER — AMLODIPINE BESYLATE 10 MG PO TABS
10.0000 mg | ORAL_TABLET | Freq: Every day | ORAL | 0 refills | Status: AC
  Filled 2024-05-19: qty 30, 30d supply, fill #0

## 2024-05-19 MED ORDER — POLYETHYLENE GLYCOL 3350 17 GM/SCOOP PO POWD
17.0000 g | Freq: Every day | ORAL | 0 refills | Status: DC | PRN
  Filled 2024-05-19: qty 238, 14d supply, fill #0

## 2024-05-19 MED ORDER — ISOSORB DINITRATE-HYDRALAZINE 20-37.5 MG PO TABS
1.0000 | ORAL_TABLET | Freq: Two times a day (BID) | ORAL | 0 refills | Status: DC
  Filled 2024-05-19: qty 60, 30d supply, fill #0

## 2024-05-19 MED ORDER — HYDRALAZINE HCL 25 MG PO TABS
25.0000 mg | ORAL_TABLET | Freq: Three times a day (TID) | ORAL | 0 refills | Status: AC
  Filled 2024-05-19: qty 90, 30d supply, fill #0

## 2024-05-19 NOTE — Discharge Summary (Addendum)
 Physician Discharge Summary  [name]:[id number] DOB: 25-Aug-1956 DOA: 05/16/2024  PCP: Vicci Barnie NOVAK, MD  Admit date: 05/16/2024 Discharge date: 05/19/2024  Admitted From: Home Disposition: Home  Recommendations for Outpatient Follow-up:  Follow up with PCP in 1 week with repeat CBC/BMP , With medications and follow-up.  Outpatient follow-up with cardiology and orthopedics. Follow up in ED if symptoms worsen or new appear   Home Health: No Equipment/Devices: None  Discharge Condition: Stable CODE STATUS: Full Diet recommendation: Heart healthy/carb modified/fluid restriction of up to 1500 cc a day  Brief/Interim Summary: 68 y.o. male with medical history significant for hypertension, hyperlipidemia, type 2 diabetes mellitus, chronic HFpEF, polysubstance abuse, noncompliance and bilateral BKAs presented with bilateral BKA stump pain and swelling.  On presentation, creatinine was 2.49 with normal WBC and normal BNP.  Chest x-ray showed no acute findings.  He was started on IV fluids.  During the hospitalization, his kidney function has normalized.  TOC was consulted for his homelessness.  TOC has provided resources as an outpatient.  He will be discharged today.  Discharge Diagnoses:   AKI on CKD stage IIIb - Baseline creatinine of 1.6-1.8.  Presented with creatinine of 2.49.  Improving to 1.74 today.  Treated with IV fluids.   - Diuretics and Jardiance  will remain on hold on discharge till reevaluation by PCP. -Discharge patient home today with outpatient follow-up with PCP with repeat BMP within a week   Bilateral BKA stump pain and swelling -Questionable cause.  Suspect this is dependent edema due to spending majority of his time in his wheelchair.  Patient does not follow-up with orthopedics as an outpatient.  Patient needs to be compliant with outpatient follow-up. - Continue pain management. - Elevate stumps while at rest.   Homelessness - TOC consulted and has  provided resources to the patient regarding the same.   Noncompliance - Counseled regarding the same   Chronic diastolic heart failure Hypertension - Currently compensated.  Diet and fluid restriction.  Diuretics will remain on hold for now.  2D echo showed EF of 50 to 55% with grade 1 diastolic dysfunction.  Outpatient follow-up with cardiology. - Echo elevated.  Started amlodipine  during the hospitalization which be continued.  Patient noncompliant with BiDil .  Will start hydralazine  on discharge.  Anemia of chronic disease - From chronic kidney disease.  Hemoglobin stable.  No signs of bleeding.  Monitor intermittently as an outpatient   Diabetes mellitus type 2 - Continue home regimen.  Carb modified diet   Discharge Instructions  Discharge Instructions     Ambulatory referral to Cardiology   Complete by: As directed    Diet - low sodium heart healthy   Complete by: As directed    Diet general   Complete by: As directed    Increase activity slowly   Complete by: As directed       Allergies as of 05/19/2024       Reactions   Bee Venom Hives, Itching, Swelling   Metformin  Other (See Comments)   Made the kidneys weak- was stopped by a MD        Medication List     STOP taking these medications    BiDil  20-37.5 MG tablet Generic drug: isosorbide -hydrALAZINE    furosemide  20 MG tablet Commonly known as: LASIX    Jardiance  10 MG Tabs tablet Generic drug: empagliflozin    torsemide  10 MG tablet Commonly known as: DEMADEX        TAKE these medications    amLODipine   10 MG tablet Commonly known as: NORVASC  Take 1 tablet (10 mg total) by mouth daily. Start taking on: May 20, 2024   aspirin  81 MG chewable tablet Commonly known as: Aspirin  Low Dose CHEW ONE TABLET BY MOUTH ONCE DAILY Needs appointment for further refills   atorvastatin  10 MG tablet Commonly known as: LIPITOR Take 1 tablet (10 mg total) by mouth every evening.   ferrous sulfate  325 (65  FE) MG tablet Commonly known as: FeroSul TAKE ONE TABLET BY MOUTH EVERY MORNING WITH BREAKFAST   gabapentin  300 MG capsule Commonly known as: NEURONTIN  Take 1 capsule (300 mg total) by mouth 2 (two) times daily.   glimepiride  1 MG tablet Commonly known as: AMARYL  Take 2 tablets (2 mg total) by mouth daily with breakfast.   hydrALAZINE  25 MG tablet Commonly known as: APRESOLINE  Take 1 tablet (25 mg total) by mouth 3 (three) times daily.   oxyCODONE  5 MG immediate release tablet Commonly known as: Oxy IR/ROXICODONE  Take 1 tablet (5 mg total) by mouth every 6 (six) hours as needed for severe pain (pain score 7-10).   polyethylene glycol powder 17 GM/SCOOP powder Commonly known as: MiraLax  Take 17 g by mouth daily as needed for mild constipation or moderate constipation.          Follow-up Information     Vicci Barnie NOVAK, MD. Schedule an appointment as soon as possible for a visit in 1 week(s).   Specialty: Internal Medicine Why: with repeat bmp Contact information: [address] [place] [address] [phone]         Harden Jerona GAILS, MD. Schedule an appointment as soon as possible for a visit in 1 week(s).   Specialty: Orthopedic Surgery Contact information: [address] [place] [address] [phone]                Allergies  Allergen Reactions   Bee Venom Hives, Itching and Swelling   Metformin  Other (See Comments)    Made the kidneys weak- was stopped by a MD    Consultations: None   Procedures/Studies: ECHOCARDIOGRAM COMPLETE Result Date: 05/18/2024    ECHOCARDIOGRAM REPORT   Patient Name:   Scott Greer Date of Exam: 05/18/2024 Medical Rec #:  [id number]      Height:       75.0 in Accession #:    [id number]     Weight:       193.0 lb Date of Birth:  01/16/1956     BSA:          2.161 m Patient Age:    67 years       BP:           173/96 mmHg Patient Gender: M              HR:           68 bpm. Exam Location:  Inpatient  Procedure: 2D Echo, Cardiac Doppler, Color Doppler and Strain Analysis (Both            Spectral and Color Flow Doppler were utilized during procedure). Indications:    CHF  History:        Patient has prior history of Echocardiogram examinations, most                 recent 03/25/2023. Risk Factors:Dyslipidemia and Hypertension.  Sonographer:    Therisa Crouch Referring Phys: [id number] [facility]  Sonographer Comments: Suboptimal parasternal window. IMPRESSIONS  1. Left ventricular ejection  fraction, by estimation, is 50 to 55%. The left ventricle has low normal function. The left ventricle has no regional wall motion abnormalities. Left ventricular diastolic parameters are consistent with Grade I diastolic dysfunction (impaired relaxation). The average left ventricular global longitudinal strain is -11.1 %. The global longitudinal strain is abnormal.  2. Right ventricular systolic function is normal. The right ventricular size is normal. Tricuspid regurgitation signal is inadequate for assessing PA pressure.  3. The mitral valve is degenerative. Trivial mitral valve regurgitation. No evidence of mitral stenosis.  4. The aortic valve was not well visualized. Aortic valve regurgitation is not visualized. Aortic valve sclerosis is present, with no evidence of aortic valve stenosis.  5. The inferior vena cava is dilated in size with <50% respiratory variability, suggesting right atrial pressure of 15 mmHg. FINDINGS  Left Ventricle: Left ventricular ejection fraction, by estimation, is 50 to 55%. The left ventricle has low normal function. The left ventricle has no regional wall motion abnormalities. The average left ventricular global longitudinal strain is -11.1 %. Strain was performed and the global longitudinal strain is abnormal. The left ventricular internal cavity size was normal in size. There is no left ventricular hypertrophy. Left ventricular diastolic parameters are consistent with Grade I diastolic  dysfunction (impaired relaxation). Right Ventricle: The right ventricular size is normal. No increase in right ventricular wall thickness. Right ventricular systolic function is normal. Tricuspid regurgitation signal is inadequate for assessing PA pressure. Left Atrium: Left atrial size was normal in size. Right Atrium: Right atrial size was normal in size. Pericardium: There is no evidence of pericardial effusion. Mitral Valve: The mitral valve is degenerative in appearance. Mild to moderate mitral annular calcification. Trivial mitral valve regurgitation. No evidence of mitral valve stenosis. Tricuspid Valve: The tricuspid valve is normal in structure. Tricuspid valve regurgitation is not demonstrated. No evidence of tricuspid stenosis. Aortic Valve: The aortic valve was not well visualized. Aortic valve regurgitation is not visualized. Aortic valve sclerosis is present, with no evidence of aortic valve stenosis. Aortic valve mean gradient measures 4.0 mmHg. Aortic valve peak gradient measures 6.8 mmHg. Aortic valve area, by VTI measures 2.58 cm. Pulmonic Valve: The pulmonic valve was not well visualized. Pulmonic valve regurgitation is not visualized. No evidence of pulmonic stenosis. Aorta: The aortic root is normal in size and structure. Venous: The inferior vena cava is dilated in size with less than 50% respiratory variability, suggesting right atrial pressure of 15 mmHg. IAS/Shunts: The atrial septum is grossly normal.  LEFT VENTRICLE PLAX 2D LVIDd:         5.50 cm      Diastology LVIDs:         4.10 cm      LV e' medial:    6.53 cm/s LV PW:         1.20 cm      LV E/e' medial:  12.5 LV IVS:        1.00 cm      LV e' lateral:   8.16 cm/s LVOT diam:     2.20 cm      LV E/e' lateral: 10.0 LV SV:         75 LV SV Index:   34           2D Longitudinal Strain LVOT Area:     3.80 cm     2D Strain GLS Avg:     -11.1 %  3D Volume EF LV Volumes (MOD)            LV 3D EDV:   154.61 ml LV  vol d, MOD A2C: 174.0 ml LV 3D ESV:   95.96 ml LV vol d, MOD A4C: 172.0 ml LV vol s, MOD A2C: 84.3 ml LV vol s, MOD A4C: 87.5 ml LV SV MOD A2C:     89.7 ml LV SV MOD A4C:     172.0 ml LV SV MOD BP:      87.2 ml RIGHT VENTRICLE             IVC RV Basal diam:  3.40 cm     IVC diam: 2.30 cm RV S prime:     11.20 cm/s TAPSE (M-mode): 1.8 cm LEFT ATRIUM             Index LA diam:        3.90 cm 1.80 cm/m LA Vol (A2C):   59.7 ml 27.62 ml/m LA Vol (A4C):   50.3 ml 23.27 ml/m LA Biplane Vol: 57.4 ml 26.56 ml/m  AORTIC VALVE AV Area (Vmax):    2.74 cm AV Area (Vmean):   2.72 cm AV Area (VTI):     2.58 cm AV Vmax:           130.00 cm/s AV Vmean:          86.900 cm/s AV VTI:            0.289 m AV Peak Grad:      6.8 mmHg AV Mean Grad:      4.0 mmHg LVOT Vmax:         93.80 cm/s LVOT Vmean:        62.100 cm/s LVOT VTI:          0.196 m LVOT/AV VTI ratio: 0.68  AORTA Ao Root diam: 3.00 cm MITRAL VALVE MV Area (PHT): 3.42 cm    SHUNTS MV Decel Time: 222 msec    Systemic VTI:  0.20 m MV E velocity: 81.40 cm/s  Systemic Diam: 2.20 cm MV A velocity: 78.40 cm/s MV E/A ratio:  1.04 Sunit Tolia Electronically signed by Madonna Large Signature Date/Time: 05/18/2024/11:42:49 AM    Final    DG Chest Port 1 View Result Date: 05/16/2024 CLINICAL DATA:  Lower extremity edema EXAM: PORTABLE CHEST 1 VIEW COMPARISON:  03/16/2024 FINDINGS: Cardiomegaly. Both lungs are clear. The visualized skeletal structures are unremarkable. IMPRESSION: Cardiomegaly without acute abnormality of the lungs. Electronically Signed   By: Marolyn JONETTA Jaksch M.D.   On: 05/16/2024 20:41      Subjective: Patient seen and examined at bedside.  Denies worsening shortness of breath, fever, vomiting.  Discharge Exam: Vitals:   05/19/24 0843 05/19/24 0844  BP: (!) 179/103 (!) 179/103  Pulse: 71   Resp: 18   Temp:    SpO2: 99%     General: Pt is alert, awake, not in acute distress.  On room air.  Looks chronically ill and deconditioned.  Flat affect.   Slow to respond. Cardiovascular: rate controlled, S1/S2 + Respiratory: bilateral decreased breath sounds at bases Abdominal: Soft, NT, ND, bowel sounds + Extremities: Bilateral BKA with slight stump swelling but no erythema or wound or discharge.   The results of significant diagnostics from this hospitalization (including imaging, microbiology, ancillary and laboratory) are listed below for reference.     Microbiology: No results found for this or any previous visit (from the past 240 hours).   Labs: BNP (last  3 results) Recent Labs    11/10/23 1400 05/16/24 2057  BNP 57.1 89.8   Basic Metabolic Panel: Recent Labs  Lab 05/16/24 2057 05/17/24 0752 05/18/24 0523 05/19/24 0535  NA 139 138 138 136  K 4.0 4.0 3.7 3.9  CL 105 108 108 107  CO2 26 24 23 24   GLUCOSE 172* 158* 155* 152*  BUN 27* 25* 25* 22  CREATININE 2.49* 2.18* 1.84* 1.74*  CALCIUM  8.6* 8.5* 8.2* 8.4*  MG  --   --  2.0 1.9   Liver Function Tests: No results for input(s): AST, ALT, ALKPHOS, BILITOT, PROT, ALBUMIN  in the last 168 hours. No results for input(s): LIPASE, AMYLASE in the last 168 hours. No results for input(s): AMMONIA in the last 168 hours. CBC: Recent Labs  Lab 05/16/24 2057 05/17/24 0752 05/18/24 0523 05/19/24 0535  WBC 6.1 5.0 5.8 7.0  HGB 9.0* 9.0* 8.5* 9.1*  HCT 28.7* 28.3* 27.7* 28.3*  MCV 88.6 88.2 87.9 87.1  PLT 254 257 250 242   Cardiac Enzymes: No results for input(s): CKTOTAL, CKMB, CKMBINDEX, TROPONINI in the last 168 hours. BNP: Invalid input(s): POCBNP CBG: Recent Labs  Lab 05/18/24 0728 05/18/24 1142 05/18/24 1640 05/18/24 2101 05/19/24 0724  GLUCAP 156* 148* 157* 162* 140*   D-Dimer No results for input(s): DDIMER in the last 72 hours. Hgb A1c Recent Labs    05/17/24 0752  HGBA1C 6.9*   Lipid Profile No results for input(s): CHOL, HDL, LDLCALC, TRIG, CHOLHDL, LDLDIRECT in the last 72 hours. Thyroid  function  studies No results for input(s): TSH, T4TOTAL, T3FREE, THYROIDAB in the last 72 hours.  Invalid input(s): FREET3 Anemia work up No results for input(s): VITAMINB12, FOLATE, FERRITIN, TIBC, IRON, RETICCTPCT in the last 72 hours. Urinalysis    Component Value Date/Time   COLORURINE YELLOW 05/17/2024 1100   APPEARANCEUR CLEAR 05/17/2024 1100   LABSPEC 1.013 05/17/2024 1100   PHURINE 5.0 05/17/2024 1100   GLUCOSEU 50 (A) 05/17/2024 1100   HGBUR NEGATIVE 05/17/2024 1100   BILIRUBINUR NEGATIVE 05/17/2024 1100   BILIRUBINUR negative 10/30/2022 1405   KETONESUR NEGATIVE 05/17/2024 1100   PROTEINUR 100 (A) 05/17/2024 1100   UROBILINOGEN 0.2 10/30/2022 1405   NITRITE NEGATIVE 05/17/2024 1100   LEUKOCYTESUR NEGATIVE 05/17/2024 1100   Sepsis Labs Recent Labs  Lab 05/16/24 2057 05/17/24 0752 05/18/24 0523 05/19/24 0535  WBC 6.1 5.0 5.8 7.0   Microbiology No results found for this or any previous visit (from the past 240 hours).   Time coordinating discharge: 35 minutes  SIGNED:   Sophie Mao, MD  [facility] 05/19/2024, 10:15 AM

## 2024-05-19 NOTE — Evaluation (Signed)
 Occupational Therapy Evaluation Patient Details Name: Scott Greer MRN: [id number] DOB: 1956/10/22 Today's Date: 05/19/2024   History of Present Illness   Pt is 68 yo male admitted on 05/16/24 with AKI on CKD and bil BKA residual limb swelling.  Pt with hx including but not limited to HTN, HLD, DM2, CHF, polysubstance abuse, non-compliance, and bil BKA.     Clinical Impressions Patient evaluated by Occupational Therapy with no further acute OT needs identified. All education has been completed and the patient has no further questions. OT worked with [facility] bedside to establish patient assistance for resources for repair of manual and pwc, OT issued work gloves for palm protection as [employer] contacted and does not carry w/c gloves. Patient demonstrated Mod I with BADL's and basic lateral scoot transfers.   See below for any follow-up Occupational Therapy or equipment needs. OT is signing off. Thank you for this referral.      If plan is discharge home, recommend the following:   Other (comment) (except for power and manual w/c needs as per care coordination with TOC)     Functional Status Assessment   Patient has not had a recent decline in their functional status     Equipment Recommendations   Other (comment) (needs repair of power w/c and manual w/c)      Precautions/Restrictions   Precautions Precautions: Fall Restrictions Weight Bearing Restrictions Per Provider Order: No     Mobility Bed Mobility Overal bed mobility: Modified Independent                  Transfers Overall transfer level: Modified independent Equipment used: None Transfers: Bed to chair/wheelchair/BSC            Lateral/Scoot Transfers: Modified independent (Device/Increase time)        Balance Overall balance assessment: Modified Independent Sitting-balance support: No upper extremity supported Sitting balance-Leahy Scale: Good Sitting balance - Comments: dynamic trunk  control WFL's                                   ADL either performed or assessed with clinical judgement   ADL Overall ADL's : Modified independent                                       General ADL Comments: able to complete sponge bathing, modified dressing bed and recliner level including leaning both directions for buttocks hygiene     Vision Baseline Vision/History: 0 No visual deficits Ability to See in Adequate Light: 0 Adequate Patient Visual Report: No change from baseline              Pertinent Vitals/Pain Pain Assessment Pain Assessment: Faces Faces Pain Scale: Hurts a little bit Pain Location: B LE's Pain Descriptors / Indicators: Aching Pain Intervention(s): Monitored during session, Repositioned     Extremity/Trunk Assessment Upper Extremity Assessment Upper Extremity Assessment: Overall WFL for tasks assessed;Right hand dominant   Lower Extremity Assessment Lower Extremity Assessment: Defer to PT evaluation   Cervical / Trunk Assessment Cervical / Trunk Assessment: Normal   Communication Communication Communication: No apparent difficulties   Cognition Arousal: Alert Behavior During Therapy: WFL for tasks assessed/performed Cognition: No apparent impairments  Following commands: Intact       Cueing  General Comments      issued rubber coated work gloves as temporary solution as Marketing executive contacted and no w/c gloves stocked to issue patient due to darkened calluses on B palms, no open regions on skin noted           Home Living Family/patient expects to be discharged to:: Shelter/Homeless                                 Additional Comments: Pt reports stays in hotels but when money runs out is on street.  Has a manual w/c and bil prosthesis.  Also reports power w/c but it is at ex-wife's house and is not working.  States he has called to get fixed but they  said would be a while.  Pt has pressure relief cushion but it is soaking wet and stuck in plastic - reports has been wet for months.      Prior Functioning/Environment Prior Level of Function : Independent/Modified Independent             Mobility Comments: Reports has bil prosthesis but hasn't wore in a few months.  Has been transferring to w/c on his own but reports difficulty pushing on road and up hills due to callus on hands.  He is worried callus will make open wound and get infected.  States he had gloves at one point but no longer has them. ADLs Comments: Typically indep with all aspects of self care including w/c mobility and toileting     AM-PAC OT 6 Clicks Daily Activity     Outcome Measure Help from another person eating meals?: None Help from another person taking care of personal grooming?: None Help from another person toileting, which includes using toliet, bedpan, or urinal?: None Help from another person bathing (including washing, rinsing, drying)?: None Help from another person to put on and taking off regular upper body clothing?: None Help from another person to put on and taking off regular lower body clothing?: None 6 Click Score: 24   End of Session Equipment Utilized During Treatment: Gait belt Nurse Communication: Mobility status  Activity Tolerance: Patient tolerated treatment well Patient left: in chair;with call bell/phone within reach;Other (comment) (TOC in room for discharge planning and OT care coordination)                   Time: 1001-1039 OT Time Calculation (min): 38 min Charges:  OT General Charges $OT Visit: 1 Visit OT Evaluation $OT Eval Low Complexity: 1 Low OT Treatments $Self Care/Home Management : 8-22 mins Yomaira Solar OT/L Acute Rehabilitation Department  [phone]  05/19/2024, 10:50 AM

## 2024-05-19 NOTE — Progress Notes (Signed)
 Discharge instructions given to patient, he verbalizes understanding of medications and follow up appointments D Scott Greer

## 2024-05-20 ENCOUNTER — Telehealth: Payer: Self-pay | Admitting: *Deleted

## 2024-05-20 ENCOUNTER — Telehealth: Payer: Self-pay

## 2024-05-20 NOTE — Transitions of Care (Post Inpatient/ED Visit) (Signed)
   05/20/2024  Name: Scott Greer MRN: [id number] DOB: [birth year]  Today's TOC FU Call Status: Today's TOC FU Call Status:: Unsuccessful Call (1st Attempt) Unsuccessful Call (1st Attempt) Date: 05/20/24  Attempted to reach the patient regarding the most recent Inpatient/ED visit.  Follow Up Plan: Additional outreach attempts will be made to reach the patient to complete the Transitions of Care (Post Inpatient/ED visit) call.   Signature  Slater Diesel, RN

## 2024-05-20 NOTE — Transitions of Care (Post Inpatient/ED Visit) (Signed)
   05/20/2024  Name: Scott Greer MRN: [id number] DOB: 1956/04/22  Today's TOC FU Call Status: Today's TOC FU Call Status:: Unsuccessful Call (1st Attempt) Unsuccessful Call (1st Attempt) Date: 05/20/24  Attempted to reach the patient regarding the most recent Inpatient/ED visit.  Follow Up Plan: Additional outreach attempts will be made to reach the patient to complete the Transitions of Care (Post Inpatient/ED visit) call.   Cathlean Headland BSN RN [facility] Care Management Coordinator [email or web address] Direct Dial: [phone]  Fax: [phone] Website: [email or web address]

## 2024-05-21 ENCOUNTER — Telehealth: Payer: Self-pay

## 2024-05-21 ENCOUNTER — Telehealth: Payer: Self-pay | Admitting: *Deleted

## 2024-05-21 NOTE — Transitions of Care (Post Inpatient/ED Visit) (Signed)
   05/21/2024  Name: Scott Greer MRN: [id number] DOB: 12-23-1955  Today's TOC FU Call Status: Today's TOC FU Call Status:: Unsuccessful Call (2nd Attempt) Unsuccessful Call (1st Attempt) Date: 05/20/24 Unsuccessful Call (2nd Attempt) Date: 05/21/24  Attempted to reach the patient regarding the most recent Inpatient/ED visit.  Follow Up Plan: Additional outreach attempts will be made to reach the patient to complete the Transitions of Care (Post Inpatient/ED visit) call.   Signature  Slater Diesel, RN

## 2024-05-21 NOTE — Transitions of Care (Post Inpatient/ED Visit) (Signed)
   05/21/2024  Name: OAKLEY KOSSMAN MRN: [id number] DOB: [birth year]  Today's TOC FU Call Status: Today's TOC FU Call Status:: Unsuccessful Call (2nd Attempt) Unsuccessful Call (2nd Attempt) Date: 05/21/24  Attempted to reach the patient regarding the most recent Inpatient/ED visit.  Follow Up Plan: Additional outreach attempts will be made to reach the patient to complete the Transitions of Care (Post Inpatient/ED visit) call.   Andrea Dimes RN, BSN [facility] RN Care Manager [phone]

## 2024-05-22 ENCOUNTER — Telehealth: Payer: Self-pay | Admitting: *Deleted

## 2024-05-22 NOTE — Transitions of Care (Post Inpatient/ED Visit) (Signed)
   05/22/2024  Name: Scott Greer MRN: [id number] DOB: Feb 20, 1956  Today's TOC FU Call Status: Today's TOC FU Call Status:: Unsuccessful Call (3rd Attempt) Unsuccessful Call (3rd Attempt) Date: 05/22/24  Attempted to reach the patient regarding the most recent Inpatient/ED visit.  Follow Up Plan: No further outreach attempts will be made at this time. We have been unable to contact the patient.  Andrea Dimes RN, BSN [facility] RN Care Manager [phone]

## 2024-05-26 ENCOUNTER — Other Ambulatory Visit: Payer: Self-pay

## 2024-05-26 ENCOUNTER — Emergency Department (HOSPITAL_COMMUNITY)
Admission: EM | Admit: 2024-05-26 | Discharge: 2024-05-26 | Disposition: A | Discharge status: Home / Self Care | Attending: Emergency Medicine | Admitting: Emergency Medicine

## 2024-05-26 ENCOUNTER — Encounter (HOSPITAL_COMMUNITY): Payer: Self-pay

## 2024-05-26 DIAGNOSIS — Z79899 Other long term (current) drug therapy: Secondary | ICD-10-CM | POA: Insufficient documentation

## 2024-05-26 DIAGNOSIS — I110 Hypertensive heart disease with heart failure: Secondary | ICD-10-CM | POA: Diagnosis not present

## 2024-05-26 DIAGNOSIS — Z720 Tobacco use: Secondary | ICD-10-CM | POA: Diagnosis not present

## 2024-05-26 DIAGNOSIS — Z7984 Long term (current) use of oral hypoglycemic drugs: Secondary | ICD-10-CM | POA: Insufficient documentation

## 2024-05-26 DIAGNOSIS — Z5900 Homelessness unspecified: Secondary | ICD-10-CM | POA: Insufficient documentation

## 2024-05-26 DIAGNOSIS — N189 Chronic kidney disease, unspecified: Secondary | ICD-10-CM | POA: Diagnosis not present

## 2024-05-26 DIAGNOSIS — R600 Localized edema: Secondary | ICD-10-CM

## 2024-05-26 DIAGNOSIS — I509 Heart failure, unspecified: Secondary | ICD-10-CM | POA: Diagnosis not present

## 2024-05-26 DIAGNOSIS — R2243 Localized swelling, mass and lump, lower limb, bilateral: Secondary | ICD-10-CM | POA: Diagnosis present

## 2024-05-26 LAB — CBC WITH DIFFERENTIAL/PLATELET
Abs Immature Granulocytes: 0.02 K/uL (ref 0.00–0.07)
Basophils Absolute: 0 K/uL (ref 0.0–0.1)
Basophils Relative: 0 %
Eosinophils Absolute: 0.1 K/uL (ref 0.0–0.5)
Eosinophils Relative: 2 %
HCT: 28.5 % — ABNORMAL LOW (ref 39.0–52.0)
Hemoglobin: 9.1 g/dL — ABNORMAL LOW (ref 13.0–17.0)
Immature Granulocytes: 0 %
Lymphocytes Relative: 15 %
Lymphs Abs: 1.1 K/uL (ref 0.7–4.0)
MCH: 28 pg (ref 26.0–34.0)
MCHC: 31.9 g/dL (ref 30.0–36.0)
MCV: 87.7 fL (ref 80.0–100.0)
Monocytes Absolute: 0.5 K/uL (ref 0.1–1.0)
Monocytes Relative: 7 %
Neutro Abs: 5.2 K/uL (ref 1.7–7.7)
Neutrophils Relative %: 76 %
Platelets: 283 K/uL (ref 150–400)
RBC: 3.25 MIL/uL — ABNORMAL LOW (ref 4.22–5.81)
RDW: 15.4 % (ref 11.5–15.5)
WBC: 6.9 K/uL (ref 4.0–10.5)
nRBC: 0 % (ref 0.0–0.2)

## 2024-05-26 LAB — BASIC METABOLIC PANEL WITH GFR
Anion gap: 10 (ref 5–15)
BUN: 34 mg/dL — ABNORMAL HIGH (ref 8–23)
CO2: 22 mmol/L (ref 22–32)
Calcium: 9 mg/dL (ref 8.9–10.3)
Chloride: 106 mmol/L (ref 98–111)
Creatinine, Ser: 2.24 mg/dL — ABNORMAL HIGH (ref 0.61–1.24)
GFR, Estimated: 32 mL/min — ABNORMAL LOW (ref >60)
Glucose, Bld: 161 mg/dL — ABNORMAL HIGH (ref 70–99)
Potassium: 4.2 mmol/L (ref 3.5–5.1)
Sodium: 138 mmol/L (ref 135–145)

## 2024-05-26 LAB — URINALYSIS, ROUTINE W REFLEX MICROSCOPIC
Bacteria, UA: NONE SEEN
Bilirubin Urine: NEGATIVE
Glucose, UA: 50 mg/dL — AB
Hgb urine dipstick: NEGATIVE
Ketones, ur: NEGATIVE mg/dL
Leukocytes,Ua: NEGATIVE
Nitrite: NEGATIVE
Protein, ur: 100 mg/dL — AB
Specific Gravity, Urine: 1.012 (ref 1.005–1.030)
pH: 6 (ref 5.0–8.0)

## 2024-05-26 LAB — BRAIN NATRIURETIC PEPTIDE: B Natriuretic Peptide: 81.2 pg/mL (ref 0.0–100.0)

## 2024-05-26 LAB — MAGNESIUM: Magnesium: 2.2 mg/dL (ref 1.7–2.4)

## 2024-05-26 MED ORDER — FUROSEMIDE 20 MG PO TABS: 20.0000 mg | ORAL_TABLET | Freq: Every day | ORAL | 0 refills | Status: DC

## 2024-05-26 MED ORDER — OXYCODONE-ACETAMINOPHEN 5-325 MG PO TABS
1.0000 | ORAL_TABLET | Freq: Once | ORAL | Status: AC
  Administered 2024-05-26: 1 via ORAL
  Filled 2024-05-26: qty 1

## 2024-05-26 NOTE — Discharge Instructions (Signed)
 Please restart your furosemide  1 tablet daily for 2 weeks.  Try to keep to a low-salt diet and elevate your legs.  Follow-up with your primary care doctor.

## 2024-05-26 NOTE — Progress Notes (Signed)
 TOC consulted for shelter resources. Resources attached to AVS. Pt is very familiar to [facility] team. No additional TOC needs.

## 2024-05-26 NOTE — ED Provider Notes (Signed)
 [place] EMERGENCY DEPARTMENT AT [facility] Provider Note   CSN: [id number] Arrival date & time: 05/26/24  1021     Patient presents with: Leg Swelling and Homeless   Scott Greer is a 68 y.o. male.  He has a history of hypertension and CHF bilateral BKA homelessness.  He was just discharged a week ago after being admitted for bilateral lower extremity swelling and AKI.  He said since discharge he has been homeless on the streets.  Increased swelling of his legs again with severe pain.  He also has calluses on his hands and his hands hurt due to not having a motorized wheelchair and having to wheel himself everywhere.  Unclear if he is taking his medications.  He says he thinks he wants to go to a nursing home because he cannot take care of himself.  No fevers or chills.  No chest pain or shortness of breath.   The history is provided by the patient.       Prior to Admission medications   Medication Sig Start Date End Date Taking? Authorizing Provider  amLODipine  (NORVASC ) 10 MG tablet Take 1 tablet (10 mg total) by mouth daily. 05/20/24   Cheryle Page, MD  aspirin  (ASPIRIN  LOW DOSE) 81 MG chewable tablet CHEW ONE TABLET BY MOUTH ONCE DAILY Needs appointment for further refills Patient not taking: Reported on 11/10/2023 10/25/22   Danton Jon HERO, PA-C  atorvastatin  (LIPITOR) 10 MG tablet Take 1 tablet (10 mg total) by mouth every evening. Patient not taking: Reported on 11/10/2023 12/14/22   Vicci Barnie NOVAK, MD  ferrous sulfate  (FEROSUL) 325 (65 FE) MG tablet TAKE ONE TABLET BY MOUTH EVERY MORNING WITH BREAKFAST Patient not taking: Reported on 11/10/2023 08/15/22   Vicci Barnie NOVAK, MD  gabapentin  (NEURONTIN ) 300 MG capsule Take 1 capsule (300 mg total) by mouth 2 (two) times daily. Patient not taking: Reported on 11/10/2023 01/17/23   Vicci Barnie NOVAK, MD  glimepiride  (AMARYL ) 1 MG tablet Take 2 tablets (2 mg total) by mouth daily with breakfast. Patient not taking:  Reported on 11/10/2023 04/17/23   Danton Jon HERO, PA-C  hydrALAZINE  (APRESOLINE ) 25 MG tablet Take 1 tablet (25 mg total) by mouth 3 (three) times daily. 05/19/24 05/19/25  Cheryle Page, MD  oxyCODONE  (OXY IR/ROXICODONE ) 5 MG immediate release tablet Take 1 tablet (5 mg total) by mouth every 6 (six) hours as needed for severe pain (pain score 7-10). 05/19/24   Cheryle Page, MD  polyethylene glycol powder (MIRALAX ) 17 GM/SCOOP powder Take 17 g by mouth daily as needed for mild constipation or moderate constipation. 05/19/24   Cheryle Page, MD    Allergies: Bee venom and Metformin     Review of Systems  Constitutional:  Negative for fever.  HENT:  Negative for sore throat.   Respiratory:  Negative for shortness of breath.   Cardiovascular:  Positive for leg swelling. Negative for chest pain.  Gastrointestinal:  Negative for abdominal pain.  Genitourinary:  Negative for dysuria.  Skin:  Negative for rash.    Updated Vital Signs BP (!) 164/88 (BP Location: Right Arm)   Pulse 75   Temp 97.7 F (36.5 C) (Oral)   Resp 20   Ht 6' 3 (1.905 m)   Wt 93.9 kg   SpO2 100%   BMI 25.87 kg/m   Physical Exam Vitals and nursing note reviewed.  Constitutional:      General: He is not in acute distress.    Appearance: Normal appearance.  He is well-developed.  HENT:     Head: Normocephalic and atraumatic.  Cardiovascular:     Rate and Rhythm: Normal rate and regular rhythm.     Heart sounds: No murmur heard. Pulmonary:     Effort: Pulmonary effort is normal. No respiratory distress.     Breath sounds: Normal breath sounds.  Abdominal:     Palpations: Abdomen is soft.     Tenderness: There is no abdominal tenderness. There is no guarding or rebound.  Musculoskeletal:        General: No swelling.     Cervical back: Neck supple.     Right lower leg: Edema present.     Left lower leg: Edema present.     Comments: He has bilateral BKA's. small superficial sore on his right stump.  They are  diffusely tender.  Both of his palms are callused and are tender.  No gross signs of infection though.  Skin:    General: Skin is warm and dry.     Capillary Refill: Capillary refill takes less than 2 seconds.  Neurological:     General: No focal deficit present.     Mental Status: He is alert.     Motor: No weakness.     (all labs ordered are listed, but only abnormal results are displayed) Labs Reviewed  BASIC METABOLIC PANEL WITH GFR - Abnormal; Notable for the following components:      Result Value   Glucose, Bld 161 (*)    BUN 34 (*)    Creatinine, Ser 2.24 (*)    GFR, Estimated 32 (*)    All other components within normal limits  CBC WITH DIFFERENTIAL/PLATELET - Abnormal; Notable for the following components:   RBC 3.25 (*)    Hemoglobin 9.1 (*)    HCT 28.5 (*)    All other components within normal limits  URINALYSIS, ROUTINE W REFLEX MICROSCOPIC - Abnormal; Notable for the following components:   Color, Urine STRAW (*)    Glucose, UA 50 (*)    Protein, ur 100 (*)    All other components within normal limits  BRAIN NATRIURETIC PEPTIDE  MAGNESIUM     EKG: EKG Interpretation Date/Time:  Tuesday May 26 2024 10:39:50 EDT Ventricular Rate:  73 PR Interval:  137 QRS Duration:  109 QT Interval:  422 QTC Calculation: 465 R Axis:   -48  Text Interpretation: Sinus rhythm LAD, consider left anterior fascicular block Abnormal R-wave progression, early transition Left ventricular hypertrophy Borderline T abnormalities, inferior leads No significant change since prior 7/25 Confirmed by Towana Sharper [phone]) on 05/26/2024 10:50:13 AM  Radiology: No results found.   Procedures   Medications Ordered in the ED  oxyCODONE -acetaminophen  (PERCOCET/ROXICET) 5-325 MG per tablet 1 tablet (1 tablet Oral Given 05/26/24 1113)    Clinical Course as of 05/26/24 1751  Tue May 26, 2024  1255 Reviewed case with nephrology Dr. Geralynn.  He said it would be reasonable to start the  patient back on Lasix  20 mg.  Ideally he would follow-up with a primary care doctor to get his lab work rechecked.  He also does not feel the patient needs to be admitted to the hospital for this at this time. [MB]  1300 I also messaged Child psychotherapist and they said there very familiar with him and he does not have any options as far as a nursing home at this time.  They are giving him shelter information.  Patient is quite frustrated with this plan but at  this point does not need acute medical admission. [MB]    Clinical Course User Index [MB] Towana Ozell BROCKS, MD                                 Medical Decision Making Amount and/or Complexity of Data Reviewed Labs: ordered.  Risk Prescription drug management.   This patient complains of bilateral leg pain and swelling; this involves an extensive number of treatment Options and is a complaint that carries with it a high risk of complications and morbidity. The differential includes DVT, peripheral edema, cellulitis, chronic pain  I ordered, reviewed and interpreted labs, which included CBC with chronically low hemoglobin, chemistries with worsening of his CKD, urinalysis without signs of infection, BMP normal I ordered medication oral pain medicine and reviewed PMP when indicated. Previous records obtained and reviewed in epic, multiple ED visits for similar complaints.  Was just discharged. I consulted nephrology Dr. Geralynn and social work and discussed lab and imaging findings and discussed disposition.  Cardiac monitoring reviewed, sinus rhythm Social determinants considered, multiple barriers including homelessness transportation insecurity financial insecurity tobacco use Critical Interventions: None  After the interventions stated above, I reevaluated the patient and found patient to be resting comfortably with elevated blood pressure but otherwise stable vital signs Admission and further testing considered, no indications for  admission.  Will restart his diuretic.  Patient will need close outpatient follow-up with PCP.  Return instructions discussed.      Final diagnoses:  Leg edema  Chronic kidney disease, unspecified CKD stage    ED Discharge Orders          Ordered    furosemide  (LASIX ) 20 MG tablet  Daily        05/26/24 1301               Towana Ozell BROCKS, MD 05/26/24 1754

## 2024-05-26 NOTE — ED Triage Notes (Signed)
 BIB EMS.  Patient is homeless and complains blateral leg swelling and pain.  Patient bilateral BKA.  Reports he has been taking the meds prescribed.  Patient has 2+ pitting edema to stumps.  Patient also complains of callus on bilateral hands that are spliting and causing pain so he can't wheel his wheelchair.

## 2024-06-16 ENCOUNTER — Emergency Department (HOSPITAL_COMMUNITY)

## 2024-06-16 ENCOUNTER — Other Ambulatory Visit: Payer: Self-pay

## 2024-06-16 ENCOUNTER — Emergency Department (HOSPITAL_COMMUNITY)
Admission: EM | Admit: 2024-06-16 | Discharge: 2024-06-16 | Disposition: A | Discharge status: Home / Self Care | Source: Home / Self Care | Attending: Emergency Medicine | Admitting: Emergency Medicine

## 2024-06-16 ENCOUNTER — Emergency Department (HOSPITAL_COMMUNITY)
Admission: EM | Admit: 2024-06-16 | Discharge: 2024-06-16 | Disposition: A | Discharge status: Home / Self Care | Attending: Emergency Medicine | Admitting: Emergency Medicine

## 2024-06-16 ENCOUNTER — Other Ambulatory Visit (HOSPITAL_COMMUNITY): Payer: Self-pay

## 2024-06-16 ENCOUNTER — Encounter (HOSPITAL_COMMUNITY): Payer: Self-pay

## 2024-06-16 DIAGNOSIS — Z7982 Long term (current) use of aspirin: Secondary | ICD-10-CM | POA: Insufficient documentation

## 2024-06-16 DIAGNOSIS — Z79899 Other long term (current) drug therapy: Secondary | ICD-10-CM | POA: Diagnosis not present

## 2024-06-16 DIAGNOSIS — Z89512 Acquired absence of left leg below knee: Secondary | ICD-10-CM | POA: Insufficient documentation

## 2024-06-16 DIAGNOSIS — R9082 White matter disease, unspecified: Secondary | ICD-10-CM | POA: Diagnosis not present

## 2024-06-16 DIAGNOSIS — S88121D Partial traumatic amputation at level between knee and ankle, right lower leg, subsequent encounter: Secondary | ICD-10-CM | POA: Diagnosis not present

## 2024-06-16 DIAGNOSIS — Z5900 Homelessness unspecified: Secondary | ICD-10-CM | POA: Diagnosis not present

## 2024-06-16 DIAGNOSIS — Y92828 Other wilderness area as the place of occurrence of the external cause: Secondary | ICD-10-CM | POA: Insufficient documentation

## 2024-06-16 DIAGNOSIS — R159 Full incontinence of feces: Secondary | ICD-10-CM | POA: Insufficient documentation

## 2024-06-16 DIAGNOSIS — R52 Pain, unspecified: Secondary | ICD-10-CM | POA: Diagnosis present

## 2024-06-16 DIAGNOSIS — W19XXXA Unspecified fall, initial encounter: Secondary | ICD-10-CM

## 2024-06-16 DIAGNOSIS — I10 Essential (primary) hypertension: Secondary | ICD-10-CM | POA: Insufficient documentation

## 2024-06-16 DIAGNOSIS — Z89511 Acquired absence of right leg below knee: Secondary | ICD-10-CM | POA: Diagnosis not present

## 2024-06-16 DIAGNOSIS — W19XXXD Unspecified fall, subsequent encounter: Secondary | ICD-10-CM | POA: Insufficient documentation

## 2024-06-16 LAB — CBC
HCT: 29.4 % — ABNORMAL LOW (ref 39.0–52.0)
Hemoglobin: 9.2 g/dL — ABNORMAL LOW (ref 13.0–17.0)
MCH: 27.3 pg (ref 26.0–34.0)
MCHC: 31.3 g/dL (ref 30.0–36.0)
MCV: 87.2 fL (ref 80.0–100.0)
Platelets: 272 K/uL (ref 150–400)
RBC: 3.37 MIL/uL — ABNORMAL LOW (ref 4.22–5.81)
RDW: 15.6 % — ABNORMAL HIGH (ref 11.5–15.5)
WBC: 7.8 K/uL (ref 4.0–10.5)
nRBC: 0 % (ref 0.0–0.2)

## 2024-06-16 LAB — COMPREHENSIVE METABOLIC PANEL WITH GFR
ALT: 16 U/L (ref 0–44)
AST: 18 U/L (ref 15–41)
Albumin: 3.5 g/dL (ref 3.5–5.0)
Alkaline Phosphatase: 42 U/L (ref 38–126)
Anion gap: 10 (ref 5–15)
BUN: 23 mg/dL (ref 8–23)
CO2: 21 mmol/L — ABNORMAL LOW (ref 22–32)
Calcium: 9 mg/dL (ref 8.9–10.3)
Chloride: 107 mmol/L (ref 98–111)
Creatinine, Ser: 1.99 mg/dL — ABNORMAL HIGH (ref 0.61–1.24)
GFR, Estimated: 37 mL/min — ABNORMAL LOW (ref >60)
Glucose, Bld: 179 mg/dL — ABNORMAL HIGH (ref 70–99)
Potassium: 3.6 mmol/L (ref 3.5–5.1)
Sodium: 138 mmol/L (ref 135–145)
Total Bilirubin: 0.3 mg/dL (ref 0.0–1.2)
Total Protein: 6.3 g/dL — ABNORMAL LOW (ref 6.5–8.1)

## 2024-06-16 LAB — I-STAT CHEM 8, ED
BUN: 24 mg/dL — ABNORMAL HIGH (ref 8–23)
Calcium, Ion: 1.09 mmol/L — ABNORMAL LOW (ref 1.15–1.40)
Chloride: 107 mmol/L (ref 98–111)
Creatinine, Ser: 2.1 mg/dL — ABNORMAL HIGH (ref 0.61–1.24)
Glucose, Bld: 176 mg/dL — ABNORMAL HIGH (ref 70–99)
HCT: 29 % — ABNORMAL LOW (ref 39.0–52.0)
Hemoglobin: 9.9 g/dL — ABNORMAL LOW (ref 13.0–17.0)
Potassium: 3.7 mmol/L (ref 3.5–5.1)
Sodium: 138 mmol/L (ref 135–145)
TCO2: 20 mmol/L — ABNORMAL LOW (ref 22–32)

## 2024-06-16 LAB — PROTIME-INR
INR: 1 (ref 0.8–1.2)
Prothrombin Time: 13.7 s (ref 11.4–15.2)

## 2024-06-16 LAB — ETHANOL: Alcohol, Ethyl (B): <15 mg/dL (ref <15)

## 2024-06-16 LAB — I-STAT CG4 LACTIC ACID, ED: Lactic Acid, Venous: 0.9 mmol/L (ref 0.5–1.9)

## 2024-06-16 MED ORDER — NAPROXEN 500 MG PO TABS
500.0000 mg | ORAL_TABLET | Freq: Two times a day (BID) | ORAL | 0 refills | Status: DC
  Filled 2024-06-16: qty 30, 15d supply, fill #0

## 2024-06-16 MED ORDER — ACETAMINOPHEN 500 MG PO TABS
1000.0000 mg | ORAL_TABLET | Freq: Once | ORAL | Status: AC
  Administered 2024-06-16 (×2): 1000 mg via ORAL
  Filled 2024-06-16: qty 2

## 2024-06-16 NOTE — ED Notes (Signed)
 Patient dc by RN Karleen. Patient provided prescriptions from community pharmacy. Verbalized instructions. Given food prior to dc by this RN

## 2024-06-16 NOTE — ED Notes (Signed)
 Pt incontinent prior to arrival. Soiled clothes upon arrival. [place] given.
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
## 2024-06-16 NOTE — Discharge Planning (Addendum)
 RNCM noted patient to have had 13 ED visits within last 6 months for similar complaints. Patient presents today with pain in both legs and both hands. PMHx is significant for bilateral BKA, chronic kidney disease, diabetes, hypertension and chronic pain. Patient is insured with a PCP [employer] and [facility].   Giannamarie Paulus J. Debarah, BSN, RN, [facility]  Inpatient Care Management  Nurse Case Manager  [facility] Emergency Departments  Operative Services  [phone]
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
## 2024-06-16 NOTE — Discharge Instructions (Signed)
 Please follow-up with your regular doctor.  Your tests look good.

## 2024-06-16 NOTE — Discharge Instructions (Signed)
 Take Tylenol  and ibuprofen  for pain control.  Take Lasix  as needed for swelling of the lower extremity.  Return to emergency room with new or worsening symptoms.  I would recommend following up with your primary care doctor for recheck of symptoms
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
## 2024-06-16 NOTE — Discharge Planning (Signed)
 Inpatient Care Management (ICM) consulted regarding well known pt needing a replacement wheelchair. Pt has had a wheelchair replaced within last 5 years and is not eligible for replacement at this time.  RNCM provided a donated wheelchair for pt use. No additional ICM needs identified at this time/

## 2024-06-18 ENCOUNTER — Other Ambulatory Visit: Payer: Self-pay

## 2024-06-18 ENCOUNTER — Emergency Department (HOSPITAL_COMMUNITY)
Admission: EM | Admit: 2024-06-18 | Discharge: 2024-06-18 | Disposition: A | Discharge status: Home / Self Care | Attending: Emergency Medicine | Admitting: Emergency Medicine

## 2024-06-18 ENCOUNTER — Other Ambulatory Visit (HOSPITAL_COMMUNITY): Payer: Self-pay

## 2024-06-18 ENCOUNTER — Emergency Department (HOSPITAL_COMMUNITY)

## 2024-06-18 DIAGNOSIS — Z7982 Long term (current) use of aspirin: Secondary | ICD-10-CM | POA: Diagnosis not present

## 2024-06-18 DIAGNOSIS — E1122 Type 2 diabetes mellitus with diabetic chronic kidney disease: Secondary | ICD-10-CM | POA: Insufficient documentation

## 2024-06-18 DIAGNOSIS — Z79899 Other long term (current) drug therapy: Secondary | ICD-10-CM | POA: Insufficient documentation

## 2024-06-18 DIAGNOSIS — S79922A Unspecified injury of left thigh, initial encounter: Secondary | ICD-10-CM | POA: Diagnosis present

## 2024-06-18 DIAGNOSIS — S79921A Unspecified injury of right thigh, initial encounter: Secondary | ICD-10-CM | POA: Insufficient documentation

## 2024-06-18 DIAGNOSIS — S79929A Unspecified injury of unspecified thigh, initial encounter: Secondary | ICD-10-CM

## 2024-06-18 DIAGNOSIS — W208XXA Other cause of strike by thrown, projected or falling object, initial encounter: Secondary | ICD-10-CM | POA: Insufficient documentation

## 2024-06-18 DIAGNOSIS — Y92524 Gas station as the place of occurrence of the external cause: Secondary | ICD-10-CM | POA: Diagnosis not present

## 2024-06-18 DIAGNOSIS — R600 Localized edema: Secondary | ICD-10-CM | POA: Insufficient documentation

## 2024-06-18 DIAGNOSIS — N189 Chronic kidney disease, unspecified: Secondary | ICD-10-CM | POA: Diagnosis not present

## 2024-06-18 DIAGNOSIS — I509 Heart failure, unspecified: Secondary | ICD-10-CM | POA: Insufficient documentation

## 2024-06-18 LAB — CBC WITH DIFFERENTIAL/PLATELET
Abs Immature Granulocytes: 0.02 K/uL (ref 0.00–0.07)
Basophils Absolute: 0 K/uL (ref 0.0–0.1)
Basophils Relative: 0 %
Eosinophils Absolute: 0.1 K/uL (ref 0.0–0.5)
Eosinophils Relative: 1 %
HCT: 30 % — ABNORMAL LOW (ref 39.0–52.0)
Hemoglobin: 9.4 g/dL — ABNORMAL LOW (ref 13.0–17.0)
Immature Granulocytes: 0 %
Lymphocytes Relative: 15 %
Lymphs Abs: 1.1 K/uL (ref 0.7–4.0)
MCH: 27.2 pg (ref 26.0–34.0)
MCHC: 31.3 g/dL (ref 30.0–36.0)
MCV: 86.7 fL (ref 80.0–100.0)
Monocytes Absolute: 0.5 K/uL (ref 0.1–1.0)
Monocytes Relative: 7 %
Neutro Abs: 5.3 K/uL (ref 1.7–7.7)
Neutrophils Relative %: 77 %
Platelets: 275 K/uL (ref 150–400)
RBC: 3.46 MIL/uL — ABNORMAL LOW (ref 4.22–5.81)
RDW: 15.9 % — ABNORMAL HIGH (ref 11.5–15.5)
WBC: 7 K/uL (ref 4.0–10.5)
nRBC: 0 % (ref 0.0–0.2)

## 2024-06-18 LAB — COMPREHENSIVE METABOLIC PANEL WITH GFR
ALT: 14 U/L (ref 0–44)
AST: 17 U/L (ref 15–41)
Albumin: 3.8 g/dL (ref 3.5–5.0)
Alkaline Phosphatase: 43 U/L (ref 38–126)
Anion gap: 8 (ref 5–15)
BUN: 22 mg/dL (ref 8–23)
CO2: 24 mmol/L (ref 22–32)
Calcium: 9.2 mg/dL (ref 8.9–10.3)
Chloride: 105 mmol/L (ref 98–111)
Creatinine, Ser: 1.8 mg/dL — ABNORMAL HIGH (ref 0.61–1.24)
GFR, Estimated: 41 mL/min — ABNORMAL LOW (ref >60)
Glucose, Bld: 176 mg/dL — ABNORMAL HIGH (ref 70–99)
Potassium: 4.1 mmol/L (ref 3.5–5.1)
Sodium: 137 mmol/L (ref 135–145)
Total Bilirubin: 0.5 mg/dL (ref 0.0–1.2)
Total Protein: 7 g/dL (ref 6.5–8.1)

## 2024-06-18 LAB — BRAIN NATRIURETIC PEPTIDE: B Natriuretic Peptide: 228.9 pg/mL — ABNORMAL HIGH (ref 0.0–100.0)

## 2024-06-18 MED ORDER — FUROSEMIDE 20 MG PO TABS
20.0000 mg | ORAL_TABLET | Freq: Every day | ORAL | 0 refills | Status: DC
  Filled 2024-06-18: qty 14, 14d supply, fill #0

## 2024-06-18 MED ORDER — ACETAMINOPHEN 500 MG PO TABS
1000.0000 mg | ORAL_TABLET | Freq: Once | ORAL | Status: AC
  Administered 2024-06-18: 1000 mg via ORAL
  Filled 2024-06-18 (×2): qty 2

## 2024-06-18 MED ORDER — FUROSEMIDE 40 MG PO TABS
20.0000 mg | ORAL_TABLET | Freq: Once | ORAL | Status: AC
  Administered 2024-06-18: 20 mg via ORAL
  Filled 2024-06-18: qty 1

## 2024-06-18 NOTE — Discharge Instructions (Addendum)
 Do NOT take Naproxen  as this can affect your kidneys. You may take Tylenol  for pain. We are prescribing you Lasix  to help with your leg swelling. Be sure to take your other medicines as prescribed.   Follow up with your primary care provider for further follow up and care.  Return to the ER if you do follow-up new or worsening pain, swelling, numbness, tingling, or any other new/concerning symptoms.
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
## 2024-06-18 NOTE — ED Notes (Addendum)
 Pt given resources for shelter.

## 2024-06-28 ENCOUNTER — Emergency Department (HOSPITAL_COMMUNITY)
Admission: EM | Admit: 2024-06-28 | Discharge: 2024-06-28 | Disposition: A | Discharge status: Home / Self Care | Attending: Emergency Medicine | Admitting: Emergency Medicine

## 2024-06-28 ENCOUNTER — Other Ambulatory Visit: Payer: Self-pay

## 2024-06-28 ENCOUNTER — Encounter (HOSPITAL_COMMUNITY): Payer: Self-pay

## 2024-06-28 DIAGNOSIS — M7989 Other specified soft tissue disorders: Secondary | ICD-10-CM | POA: Diagnosis present

## 2024-06-28 DIAGNOSIS — E1122 Type 2 diabetes mellitus with diabetic chronic kidney disease: Secondary | ICD-10-CM | POA: Diagnosis not present

## 2024-06-28 DIAGNOSIS — L03115 Cellulitis of right lower limb: Secondary | ICD-10-CM | POA: Diagnosis not present

## 2024-06-28 DIAGNOSIS — Z7984 Long term (current) use of oral hypoglycemic drugs: Secondary | ICD-10-CM | POA: Diagnosis not present

## 2024-06-28 DIAGNOSIS — I509 Heart failure, unspecified: Secondary | ICD-10-CM | POA: Diagnosis not present

## 2024-06-28 DIAGNOSIS — Z79899 Other long term (current) drug therapy: Secondary | ICD-10-CM | POA: Insufficient documentation

## 2024-06-28 DIAGNOSIS — Z7982 Long term (current) use of aspirin: Secondary | ICD-10-CM | POA: Insufficient documentation

## 2024-06-28 DIAGNOSIS — I130 Hypertensive heart and chronic kidney disease with heart failure and stage 1 through stage 4 chronic kidney disease, or unspecified chronic kidney disease: Secondary | ICD-10-CM | POA: Diagnosis not present

## 2024-06-28 DIAGNOSIS — N189 Chronic kidney disease, unspecified: Secondary | ICD-10-CM | POA: Insufficient documentation

## 2024-06-28 DIAGNOSIS — R21 Rash and other nonspecific skin eruption: Secondary | ICD-10-CM | POA: Diagnosis present

## 2024-06-28 MED ORDER — CEPHALEXIN 500 MG PO CAPS: 500.0000 mg | ORAL_CAPSULE | Freq: Three times a day (TID) | ORAL | 0 refills | Status: AC

## 2024-06-28 MED ORDER — CEPHALEXIN 250 MG PO CAPS
500.0000 mg | ORAL_CAPSULE | Freq: Once | ORAL | Status: AC
  Administered 2024-06-28: 500 mg via ORAL
  Filled 2024-06-28: qty 2

## 2024-06-28 NOTE — ED Notes (Signed)
 Patient verbalizes understanding of discharge instructions. Opportunity for questioning and answers were provided. Armband removed by staff, pt discharged from ED. Pt wheeled himself to the lobby in his wheelchair

## 2024-06-28 NOTE — ED Notes (Signed)
 Non-adherent dressing with large tegaderm placed on R groin wound.

## 2024-06-28 NOTE — ED Triage Notes (Signed)
 Patient arrives via [facility] for bilateral upper leg swelling. Denies shob or pain. Bilateral bka

## 2024-06-28 NOTE — ED Notes (Signed)
 Pt informed of discharge and given dc papers. Pt was unwilling to leave room so security was called to escort pt out. Pt refused for v/s to be taken. Pt escorted out of the ER by security.

## 2024-06-28 NOTE — Discharge Instructions (Signed)
 Your history, exam, and evaluation today seem consistent with an early skin infection called cellulitis near the wound on your right leg.  The rest of your exam was overall reassuring and given your reassuring workups over the last few weeks we agreed to hold on more extensive workup today but agreed to start you on some antibiotics.  You were able to eat and drink and tolerated the antibiotics.  Please take it 3 times a day for the next week which I confirmed dosing with pharmacy.  Please rest and stay hydrated.  If any symptoms change or worsen acutely, return to the nearest Emergency Department.
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
## 2024-06-29 ENCOUNTER — Other Ambulatory Visit (HOSPITAL_COMMUNITY): Payer: Self-pay

## 2024-07-14 ENCOUNTER — Ambulatory Visit: Admitting: Family Medicine

## 2024-07-14 ENCOUNTER — Encounter: Payer: Self-pay | Admitting: Family Medicine

## 2024-07-14 VITALS — BP 150/75 | HR 94 | Temp 98.5°F | Resp 12

## 2024-07-14 DIAGNOSIS — Z719 Counseling, unspecified: Secondary | ICD-10-CM

## 2024-07-14 LAB — GLUCOSE, POCT (MANUAL RESULT ENTRY)
POC Glucose: 309 mg/dL — AB (ref 70–99)
POC Glucose: 309 mg/dL — AB (ref 70–99)

## 2024-07-16 ENCOUNTER — Other Ambulatory Visit: Payer: Self-pay

## 2024-07-16 ENCOUNTER — Emergency Department (HOSPITAL_COMMUNITY)
Admission: EM | Admit: 2024-07-16 | Discharge: 2024-07-17 | Disposition: A | Discharge status: Home / Self Care | Source: Home / Self Care | Attending: Emergency Medicine | Admitting: Emergency Medicine

## 2024-07-16 ENCOUNTER — Encounter (HOSPITAL_COMMUNITY): Payer: Self-pay | Admitting: Radiology

## 2024-07-16 ENCOUNTER — Emergency Department (HOSPITAL_COMMUNITY)
Admission: EM | Admit: 2024-07-16 | Discharge: 2024-07-16 | Disposition: A | Discharge status: Home / Self Care | Attending: Emergency Medicine | Admitting: Emergency Medicine

## 2024-07-16 ENCOUNTER — Other Ambulatory Visit (HOSPITAL_COMMUNITY): Payer: Self-pay

## 2024-07-16 DIAGNOSIS — M79604 Pain in right leg: Secondary | ICD-10-CM | POA: Diagnosis present

## 2024-07-16 DIAGNOSIS — L03115 Cellulitis of right lower limb: Secondary | ICD-10-CM | POA: Diagnosis not present

## 2024-07-16 DIAGNOSIS — L03116 Cellulitis of left lower limb: Secondary | ICD-10-CM | POA: Diagnosis not present

## 2024-07-16 DIAGNOSIS — E119 Type 2 diabetes mellitus without complications: Secondary | ICD-10-CM | POA: Insufficient documentation

## 2024-07-16 DIAGNOSIS — Z7982 Long term (current) use of aspirin: Secondary | ICD-10-CM | POA: Diagnosis not present

## 2024-07-16 DIAGNOSIS — M79605 Pain in left leg: Secondary | ICD-10-CM | POA: Diagnosis not present

## 2024-07-16 DIAGNOSIS — Z79899 Other long term (current) drug therapy: Secondary | ICD-10-CM | POA: Insufficient documentation

## 2024-07-16 DIAGNOSIS — Z7984 Long term (current) use of oral hypoglycemic drugs: Secondary | ICD-10-CM | POA: Insufficient documentation

## 2024-07-16 DIAGNOSIS — Z5900 Homelessness unspecified: Secondary | ICD-10-CM | POA: Insufficient documentation

## 2024-07-16 DIAGNOSIS — I509 Heart failure, unspecified: Secondary | ICD-10-CM | POA: Insufficient documentation

## 2024-07-16 DIAGNOSIS — L03119 Cellulitis of unspecified part of limb: Secondary | ICD-10-CM

## 2024-07-16 DIAGNOSIS — I110 Hypertensive heart disease with heart failure: Secondary | ICD-10-CM | POA: Diagnosis not present

## 2024-07-16 DIAGNOSIS — M7989 Other specified soft tissue disorders: Secondary | ICD-10-CM | POA: Diagnosis present

## 2024-07-16 LAB — I-STAT CHEM 8, ED
BUN: 27 mg/dL — ABNORMAL HIGH (ref 8–23)
Calcium, Ion: 1.21 mmol/L (ref 1.15–1.40)
Chloride: 103 mmol/L (ref 98–111)
Creatinine, Ser: 2.2 mg/dL — ABNORMAL HIGH (ref 0.61–1.24)
Glucose, Bld: 114 mg/dL — ABNORMAL HIGH (ref 70–99)
HCT: 26 % — ABNORMAL LOW (ref 39.0–52.0)
Hemoglobin: 8.8 g/dL — ABNORMAL LOW (ref 13.0–17.0)
Potassium: 4.2 mmol/L (ref 3.5–5.1)
Sodium: 139 mmol/L (ref 135–145)
TCO2: 24 mmol/L (ref 22–32)

## 2024-07-16 LAB — CBC WITH DIFFERENTIAL/PLATELET
Abs Immature Granulocytes: 0.04 K/uL (ref 0.00–0.07)
Basophils Absolute: 0 K/uL (ref 0.0–0.1)
Basophils Relative: 0 %
Eosinophils Absolute: 0.2 K/uL (ref 0.0–0.5)
Eosinophils Relative: 2 %
HCT: 28 % — ABNORMAL LOW (ref 39.0–52.0)
Hemoglobin: 8.8 g/dL — ABNORMAL LOW (ref 13.0–17.0)
Immature Granulocytes: 1 %
Lymphocytes Relative: 12 %
Lymphs Abs: 1.1 K/uL (ref 0.7–4.0)
MCH: 27.1 pg (ref 26.0–34.0)
MCHC: 31.4 g/dL (ref 30.0–36.0)
MCV: 86.2 fL (ref 80.0–100.0)
Monocytes Absolute: 0.9 K/uL (ref 0.1–1.0)
Monocytes Relative: 10 %
Neutro Abs: 6.6 K/uL (ref 1.7–7.7)
Neutrophils Relative %: 75 %
Platelets: 300 K/uL (ref 150–400)
RBC: 3.25 MIL/uL — ABNORMAL LOW (ref 4.22–5.81)
RDW: 15.9 % — ABNORMAL HIGH (ref 11.5–15.5)
WBC: 8.8 K/uL (ref 4.0–10.5)
nRBC: 0 % (ref 0.0–0.2)

## 2024-07-16 LAB — BASIC METABOLIC PANEL WITH GFR
Anion gap: 10 (ref 5–15)
BUN: 27 mg/dL — ABNORMAL HIGH (ref 8–23)
CO2: 24 mmol/L (ref 22–32)
Calcium: 9 mg/dL (ref 8.9–10.3)
Chloride: 104 mmol/L (ref 98–111)
Creatinine, Ser: 2.08 mg/dL — ABNORMAL HIGH (ref 0.61–1.24)
GFR, Estimated: 35 mL/min — ABNORMAL LOW (ref >60)
Glucose, Bld: 112 mg/dL — ABNORMAL HIGH (ref 70–99)
Potassium: 4.2 mmol/L (ref 3.5–5.1)
Sodium: 138 mmol/L (ref 135–145)

## 2024-07-16 LAB — BRAIN NATRIURETIC PEPTIDE: B Natriuretic Peptide: 146.8 pg/mL — ABNORMAL HIGH (ref 0.0–100.0)

## 2024-07-16 MED ORDER — CEPHALEXIN 500 MG PO CAPS
500.0000 mg | ORAL_CAPSULE | Freq: Three times a day (TID) | ORAL | 0 refills | Status: AC
  Filled 2024-07-16: qty 20, 7d supply, fill #0

## 2024-07-16 MED ORDER — OXYCODONE HCL 5 MG PO TABS
5.0000 mg | ORAL_TABLET | Freq: Once | ORAL | Status: AC
  Administered 2024-07-16: 5 mg via ORAL
  Filled 2024-07-16: qty 1

## 2024-07-16 MED ORDER — FUROSEMIDE 20 MG PO TABS
20.0000 mg | ORAL_TABLET | Freq: Every day | ORAL | 0 refills | Status: AC | PRN
  Filled 2024-07-16: qty 10, 10d supply, fill #0

## 2024-07-16 MED ORDER — CEPHALEXIN 250 MG PO CAPS: 500.0000 mg | ORAL_CAPSULE | Freq: Once | ORAL | Status: DC

## 2024-07-16 MED ORDER — SODIUM CHLORIDE 0.9 % IV SOLN
2.0000 g | Freq: Once | INTRAVENOUS | Status: AC
  Administered 2024-07-16: 2 g via INTRAVENOUS
  Filled 2024-07-16: qty 20

## 2024-07-16 NOTE — Discharge Instructions (Signed)
 Please take medicines as prescribed.   Antibiotics for 7 days. Use Lasix  as needed for swelling.  Follow-up with family doctor and return to ER with new or worsening symptoms.

## 2024-07-16 NOTE — ED Notes (Signed)
 Patient woken up and notified of being discharged and patient angry due to being in pain and reports h e can't be sent out like this.  Explained blood doesn't show any indication or infection or sepsis and does not meet admission criteria.  Informed we will get meds from the [facility] pharmacy for him and then he will be discharged.

## 2024-07-16 NOTE — ED Provider Notes (Signed)
 [place] EMERGENCY DEPARTMENT AT [facility] Provider Note   CSN: [id number] Arrival date & time: 07/16/24  0247     Patient presents with: Leg Pain   Scott Greer is a 68 y.o. male with past medical history of hyperlipidemia, hyper tension, CHF, diabetes, homelessness, bilateral below the knee amputation is presenting to emergency room with complaint of 3 weeks of leg pain and swelling.  Patient is localizing his pain to bilateral posterior thighs.  He has noticed a rash there.  He was recently diagnosed with cellulitis but did not pick up his Keflex .  He denies any fevers or chills.  Has also noticed some lower extremity swelling.  No chest pain shortness of breath or cough.    Leg Pain      Prior to Admission medications   Medication Sig Start Date End Date Taking? Authorizing Provider  amLODipine  (NORVASC ) 10 MG tablet Take 1 tablet (10 mg total) by mouth daily. 05/20/24   Cheryle Page, MD  aspirin  (ASPIRIN  LOW DOSE) 81 MG chewable tablet CHEW ONE TABLET BY MOUTH ONCE DAILY Needs appointment for further refills Patient not taking: Reported on 11/10/2023 10/25/22   Danton Jon HERO, PA-C  atorvastatin  (LIPITOR) 10 MG tablet Take 1 tablet (10 mg total) by mouth every evening. Patient not taking: Reported on 11/10/2023 12/14/22   Vicci Barnie NOVAK, MD  ferrous sulfate  (FEROSUL) 325 (65 FE) MG tablet TAKE ONE TABLET BY MOUTH EVERY MORNING WITH BREAKFAST Patient not taking: Reported on 11/10/2023 08/15/22   Vicci Barnie NOVAK, MD  furosemide  (LASIX ) 20 MG tablet Take 1 tablet (20 mg total) by mouth daily. 06/18/24   Freddi Hamilton, MD  gabapentin  (NEURONTIN ) 300 MG capsule Take 1 capsule (300 mg total) by mouth 2 (two) times daily. Patient not taking: Reported on 11/10/2023 01/17/23   Vicci Barnie NOVAK, MD  glimepiride  (AMARYL ) 1 MG tablet Take 2 tablets (2 mg total) by mouth daily with breakfast. Patient not taking: Reported on 11/10/2023 04/17/23   Danton Jon HERO, PA-C   hydrALAZINE  (APRESOLINE ) 25 MG tablet Take 1 tablet (25 mg total) by mouth 3 (three) times daily. 05/19/24 05/19/25  Cheryle Page, MD  oxyCODONE  (OXY IR/ROXICODONE ) 5 MG immediate release tablet Take 1 tablet (5 mg total) by mouth every 6 (six) hours as needed for severe pain (pain score 7-10). 05/19/24   Cheryle Page, MD  polyethylene glycol powder (MIRALAX ) 17 GM/SCOOP powder Take 17 g by mouth daily as needed for mild constipation or moderate constipation. 05/19/24   Cheryle Page, MD    Allergies: Bee venom and Metformin     Review of Systems  Cardiovascular:  Positive for leg swelling.    Updated Vital Signs BP (!) 171/87   Pulse 85   Temp 98.2 F (36.8 C)   Resp 16   Wt 93.8 kg   SpO2 100%   BMI 25.85 kg/m   Physical Exam Vitals and nursing note reviewed.  Constitutional:      General: He is not in acute distress.    Appearance: He is not toxic-appearing.  HENT:     Head: Normocephalic and atraumatic.  Eyes:     General: No scleral icterus.    Conjunctiva/sclera: Conjunctivae normal.  Cardiovascular:     Rate and Rhythm: Normal rate and regular rhythm.     Pulses: Normal pulses.     Heart sounds: Normal heart sounds.  Pulmonary:     Effort: Pulmonary effort is normal. No respiratory distress.  Breath sounds: Normal breath sounds.  Abdominal:     General: Abdomen is flat. Bowel sounds are normal.     Palpations: Abdomen is soft.     Tenderness: There is no abdominal tenderness.  Musculoskeletal:     Right lower leg: Edema present.     Left lower leg: Edema present.     Comments: Posterior thigh with erythema and swelling bilaterally, no area of fluctuance.   Skin:    General: Skin is warm and dry.     Findings: No lesion.  Neurological:     General: No focal deficit present.     Mental Status: He is alert and oriented to person, place, and time. Mental status is at baseline.     (all labs ordered are listed, but only abnormal results are  displayed) Labs Reviewed - No data to display  EKG: None  Radiology: No results found.   SABRAUltrasound ED Soft Tissue  Date/Time: 07/16/2024 10:45 AM  Performed by: Shermon Warren SAILOR, PA-C Authorized by: Shermon Warren SAILOR, PA-C   Procedure details:    Indications: localization of abscess and evaluate for cellulitis     Transverse view:  Visualized   Longitudinal view:  Visualized   Limitations:  Positioning Location:    Location: lower extremity   Findings:     no abscess present    cellulitis present    no foreign body present    Medications Ordered in the ED  cefTRIAXone  (ROCEPHIN ) 2 g in sodium chloride  0.9 % 100 mL IVPB (0 g Intravenous Stopped 07/16/24 1145)  oxyCODONE  (Oxy IR/ROXICODONE ) immediate release tablet 5 mg (5 mg Oral Given 07/16/24 1051)                                    Medical Decision Making Amount and/or Complexity of Data Reviewed Labs: ordered.  Risk Prescription drug management.   This patient presents to the ED for concern of leg pain, this involves an extensive number of treatment options, and is a complaint that carries with it a high risk of complications and morbidity.  The differential diagnosis includes cellulitis, congestive heart failure, electrolyte abnormality, dehydration   Lab Tests:  I personally interpreted labs.  The pertinent results include:   Patient has no leukocytosis.  Hemoglobin is stable at 8.8, creatinine is stable at 2 BNP is mildly elevated but downtrending since prior recent labs   Cardiac Monitoring: / EKG:  The patient was maintained on a cardiac monitor.     Problem List / ED Course / Critical interventions / Medication management  Patient presents with bilateral leg pain.  He has had workup for this in the past and had reassuring x-ray.  Today he is hemodynamically stable and he is well-appearing.  He does have some mild erythema as well as swelling on exam.  He is neurovascularly intact.  He has no sign  of systemic illness.  His lab work is overall reassuring with no leukocytosis.  Feel appropriate for discharge with oral antibiotic trial. I did review recent ED note 07/01/2024 when in which patient was diagnosed with some early cellulitis and recommended to be started on Keflex .  Patient did not pick up antibiotic at that time citing transportation issue.  Will represcribe as well as starting on Lasix  as needed.  Transition of care pharmacy brought medicine to patient prior to d/c I ordered medication including oxycodone , Keflex . Reevaluation of the  patient after these medicines showed that the patient improved I have reviewed the patients home medicines and have made adjustments as needed Stable for discharge with outpatient follow-up.        Final diagnoses:  Cellulitis of lower extremity, unspecified laterality  Bilateral leg pain    ED Discharge Orders          Ordered    furosemide  (LASIX ) 20 MG tablet  Daily PRN        07/16/24 1237    cephALEXin  (KEFLEX ) 500 MG capsule  3 times daily        07/16/24 1237               Ahmaud Duthie, Warren SAILOR, PA-C 07/16/24 1316    Freddi Hamilton, MD 07/16/24 1319

## 2024-07-16 NOTE — ED Triage Notes (Signed)
 Pt BIB EMS for BLE pain and swelling for 3 weeks. Pain 10/10. BL BKA. 156/86 HR 94 RR 16 99% RA. CBG 305

## 2024-07-16 NOTE — ED Provider Triage Note (Signed)
 Emergency Medicine Provider Triage Evaluation Note  Scott Greer , a 68 y.o. male  was evaluated in triage.  Pt complains of bilateral leg pain.  Patient reports swelling and pain to bilateral extremities for the past 2 weeks.  Bilateral BKA.  Patient has been evaluated multiple times over the past few months for the same complaint.  He denies any trauma or injury to the area.  At the time of my assessment the patient is sound asleep.  Review of Systems  Positive:  Negative:   Physical Exam  Wt 93.8 kg   BMI 25.85 kg/m  Gen:   Awake, no distress   Resp:  Normal effort  MSK:   Moves extremities without difficulty  Other:    Medical Decision Making  Medically screening exam initiated at 2:52 AM.  Appropriate orders placed.  Scott Greer was informed that the remainder of the evaluation will be completed by another provider, this initial triage assessment does not replace that evaluation, and the importance of remaining in the ED until their evaluation is complete.     Scott Ubaldo NOVAK, PA-C 07/16/24 [phone]

## 2024-07-16 NOTE — ED Notes (Signed)
 Pt help to take his antibiotic, lasix 's, amlodipine , and naproxen .

## 2024-07-16 NOTE — ED Triage Notes (Signed)
 Pt was provided with medication when he left today and states he still has not taken it. Advised that when we are done he needs to take his abx. Pt continues to complain of pain in his left thigh.

## 2024-07-16 NOTE — ED Notes (Addendum)
 TOC meds provided to patient. Patient placed in paper scrubs and put back in wheelchair.  Patient angry because he is homesless and has been living in wheelchair.

## 2024-07-17 MED ORDER — ACETAMINOPHEN 500 MG PO TABS
1000.0000 mg | ORAL_TABLET | Freq: Once | ORAL | Status: AC
  Administered 2024-07-17: 1000 mg via ORAL
  Filled 2024-07-17: qty 2

## 2024-07-17 MED ORDER — BACITRACIN ZINC 500 UNIT/GM EX OINT: TOPICAL_OINTMENT | Freq: Two times a day (BID) | CUTANEOUS | Status: DC

## 2024-07-17 NOTE — Discharge Instructions (Addendum)
 Please follow-up with your primary care doctor, return if you have worsening leg pain, rash despite taking your antibiotics.  Do best to keep your legs clean, dry.

## 2024-07-17 NOTE — ED Notes (Signed)
 There is a noticeable smell of urine coming from the pt. Upon further investigation, it was noted that the pt's personal wheelchair, clothes, and blankets were saturated in urine. Pt stated that urine came from a leak.

## 2024-07-17 NOTE — ED Provider Notes (Signed)
 Accepted handoff at shift change from Rebekah Sponseller PA-C. Please see prior provider note for more detail.   Briefly: Patient is 68 y.o.   DDX: concern for Homeless, bilateral BKAs, coming in for the 3rd time for rash on thighs. Diagnosed with cellulitis, was not able to get rx. Has now gotten from [facility]. Getting thigh abrasions from chair. Getting recurrent skin injuries. Needs TOC for assistance getting a new motorized wheelchair.   Plan: TOC assessed, are working to try to get him a motorized wheelchair but did not think it is likely to happen today.  Stable for discharge from emergency department perspective.  Do not feel that he needs admission at this time.     Rosan Sherlean DEL, PA-C 07/17/24 1205    Mannie Pac T, DO 07/19/24 1515
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
## 2024-07-17 NOTE — Care Management (Addendum)
 Transition of Care [facility]) - Emergency Department Mini Assessment   Patient Details  Name: Scott Greer MRN: [id number] Date of Birth: 07-17-1956  Transition of Care [facility]) CM/SW Contact:    Corean JAYSON Canary, RN Phone Number: 07/17/2024, 7:47 AM   Clinical Narrative: Patient presents with thigh rash and pain. Has been in the ED several times for same. Is has bilateral lower legs amputations. He had a electric wheelchair, but it rolled down the hill and is no longer useable. TOC got him a manual wheelchair. He is having some incontinence nad this is causing leg rashes. He is homeless and has been in and out of [facility].  Inquired with [employer] about getting a new electric wheelchair, awaiting replay, He is on antibiotics. No need for admission, patient would need a qualifying 3 day INP admission to be SNF candidate. Cannot get home health due to homelessness. [id number] back from [employer], [place], he does not qualify for electric wheelchair, he got one a year and a half ago, the timeline is 5 years for insurance to pay. Messaged with Nursing, she will send with chucks and briefs to keep patient dry, also suggested barrier cream ED Mini Assessment: What brought you to the Emergency Department? : rash  Barriers to Discharge: Homeless with medical needs        Interventions which prevented an admission or readmission:  (Inquired with [employer] about DME)    Patient Contact and Communications        ,                 Admission diagnosis:  legs swelling/pain Patient Active Problem List   Diagnosis Date Noted   Acute renal failure superimposed on stage 3b chronic kidney disease (HCC) 05/16/2024   Leg swelling 05/16/2024   PDR (proliferative diabetic retinopathy) (HCC) 04/10/2023   Generalized weakness 04/09/2023   Dehydration 04/09/2023   Fall at home, initial encounter 04/09/2023   Acute cystitis 04/09/2023   History of anemia due to chronic kidney disease 04/09/2023   Acute  renal failure superimposed on stage 3a chronic kidney disease (HCC) 04/08/2023   Encounter for power mobility device assessment 01/03/2023   Chronic diastolic CHF (congestive heart failure) (HCC) 12/01/2021   S/P BKA (below knee amputation) bilateral (HCC) 07/18/2021   Gangrene of right foot (HCC)    Foot pain, right 04/14/2021   Status post below-knee amputation (HCC) 02/17/2021   Wound dehiscence    Gangrene of toe of left foot (HCC)    Cutaneous abscess of left foot    Left foot infection 02/07/2021   Leukocytosis 02/07/2021   Thrombocytosis 02/07/2021   Hyponatremia 02/07/2021   AKI (acute kidney injury) (HCC) 02/07/2021   Hyperglycemia due to diabetes mellitus (HCC) 02/07/2021   New onset of congestive heart failure (HCC) 12/26/2020   CKD (chronic kidney disease) stage 3, GFR 30-59 ml/min (HCC) 12/26/2020   Acute systolic CHF (congestive heart failure) (HCC) 12/26/2020   Anemia, chronic disease 06/22/2020   Amputee, great toe, right (HCC) 06/20/2020   Normocytic anemia 06/20/2020   Erectile dysfunction associated with type 2 diabetes mellitus (HCC) 06/20/2020   Positive for macroalbuminuria 10/24/2019   Amputation of left great toe (HCC) 10/23/2019   Tobacco dependence 10/23/2019   Diabetic foot infection (HCC)    Noncompliance    Glaucoma    Hyperlipidemia    Essential hypertension 11/06/2003   DM2 (diabetes mellitus, type 2) (HCC) 11/05/1997   PCP:  Vicci Barnie NOVAK, MD Pharmacy:  [name] [address] [place] [address] Phone: [phone] Fax: [phone]  [facility] - [facility] [address] [place] [address] Phone: [phone] Fax: [phone]

## 2024-07-28 ENCOUNTER — Encounter (HOSPITAL_COMMUNITY): Payer: Self-pay

## 2024-07-28 ENCOUNTER — Other Ambulatory Visit: Payer: Self-pay

## 2024-07-28 ENCOUNTER — Emergency Department (HOSPITAL_COMMUNITY)
Admission: EM | Admit: 2024-07-28 | Discharge: 2024-07-28 | Disposition: A | Discharge status: Home / Self Care | Attending: Emergency Medicine | Admitting: Emergency Medicine

## 2024-07-28 DIAGNOSIS — I5030 Unspecified diastolic (congestive) heart failure: Secondary | ICD-10-CM | POA: Diagnosis not present

## 2024-07-28 DIAGNOSIS — I130 Hypertensive heart and chronic kidney disease with heart failure and stage 1 through stage 4 chronic kidney disease, or unspecified chronic kidney disease: Secondary | ICD-10-CM | POA: Diagnosis not present

## 2024-07-28 DIAGNOSIS — L97111 Non-pressure chronic ulcer of right thigh limited to breakdown of skin: Secondary | ICD-10-CM | POA: Insufficient documentation

## 2024-07-28 DIAGNOSIS — L97121 Non-pressure chronic ulcer of left thigh limited to breakdown of skin: Secondary | ICD-10-CM | POA: Insufficient documentation

## 2024-07-28 DIAGNOSIS — Z5900 Homelessness unspecified: Secondary | ICD-10-CM | POA: Diagnosis not present

## 2024-07-28 DIAGNOSIS — N183 Chronic kidney disease, stage 3 unspecified: Secondary | ICD-10-CM | POA: Diagnosis not present

## 2024-07-28 DIAGNOSIS — E1122 Type 2 diabetes mellitus with diabetic chronic kidney disease: Secondary | ICD-10-CM | POA: Diagnosis not present

## 2024-07-28 DIAGNOSIS — F17200 Nicotine dependence, unspecified, uncomplicated: Secondary | ICD-10-CM | POA: Insufficient documentation

## 2024-07-28 DIAGNOSIS — Z79899 Other long term (current) drug therapy: Secondary | ICD-10-CM | POA: Insufficient documentation

## 2024-07-28 DIAGNOSIS — L89892 Pressure ulcer of other site, stage 2: Secondary | ICD-10-CM | POA: Diagnosis not present

## 2024-07-28 DIAGNOSIS — E11622 Type 2 diabetes mellitus with other skin ulcer: Secondary | ICD-10-CM | POA: Insufficient documentation

## 2024-07-28 LAB — CBC WITH DIFFERENTIAL/PLATELET
Abs Immature Granulocytes: 0.02 K/uL (ref 0.00–0.07)
Basophils Absolute: 0 K/uL (ref 0.0–0.1)
Basophils Relative: 1 %
Eosinophils Absolute: 0.1 K/uL (ref 0.0–0.5)
Eosinophils Relative: 2 %
HCT: 24.1 % — ABNORMAL LOW (ref 39.0–52.0)
Hemoglobin: 7.9 g/dL — ABNORMAL LOW (ref 13.0–17.0)
Immature Granulocytes: 0 %
Lymphocytes Relative: 19 %
Lymphs Abs: 1.3 K/uL (ref 0.7–4.0)
MCH: 27.9 pg (ref 26.0–34.0)
MCHC: 32.8 g/dL (ref 30.0–36.0)
MCV: 85.2 fL (ref 80.0–100.0)
Monocytes Absolute: 0.7 K/uL (ref 0.1–1.0)
Monocytes Relative: 11 %
Neutro Abs: 4.4 K/uL (ref 1.7–7.7)
Neutrophils Relative %: 67 %
Platelets: 345 K/uL (ref 150–400)
RBC: 2.83 MIL/uL — ABNORMAL LOW (ref 4.22–5.81)
RDW: 16 % — ABNORMAL HIGH (ref 11.5–15.5)
WBC: 6.6 K/uL (ref 4.0–10.5)
nRBC: 0 % (ref 0.0–0.2)

## 2024-07-28 LAB — BASIC METABOLIC PANEL WITH GFR
Anion gap: 13 (ref 5–15)
BUN: 36 mg/dL — ABNORMAL HIGH (ref 8–23)
CO2: 22 mmol/L (ref 22–32)
Calcium: 8.9 mg/dL (ref 8.9–10.3)
Chloride: 102 mmol/L (ref 98–111)
Creatinine, Ser: 2.42 mg/dL — ABNORMAL HIGH (ref 0.61–1.24)
GFR, Estimated: 29 mL/min — ABNORMAL LOW (ref >60)
Glucose, Bld: 264 mg/dL — ABNORMAL HIGH (ref 70–99)
Potassium: 4.1 mmol/L (ref 3.5–5.1)
Sodium: 137 mmol/L (ref 135–145)

## 2024-07-28 MED ORDER — OXYCODONE-ACETAMINOPHEN 5-325 MG PO TABS
1.0000 | ORAL_TABLET | Freq: Once | ORAL | Status: AC
  Administered 2024-07-28: 1 via ORAL
  Filled 2024-07-28: qty 1

## 2024-07-28 NOTE — ED Notes (Signed)
 Attempted to review paper work with pt, pt refused paper.

## 2024-07-28 NOTE — ED Notes (Signed)
 Per report pt refusing to leave.

## 2024-07-28 NOTE — ED Notes (Signed)
 Pt refusing to leave after seeing social worker stating he would not leave without food but refused meal when offered and continues to refuse to leave.

## 2024-07-28 NOTE — ED Notes (Signed)
 Social worker contacted via secure chat about pts status.

## 2024-07-28 NOTE — Discharge Instructions (Addendum)
 You were seen today for leg pain.  You do have some pressure injuries of the right leg which are likely related to your wheelchair.  It is important to cushion your wheelchair and make sure that you are shifting frequently.  Additionally, make sure that your skin is staying clean and dry.  Schedule an appointment with your primary doctor, Scott Greer as soon as possible.

## 2024-07-28 NOTE — ED Notes (Signed)
 Per ER MD, discharge pending social worker consult

## 2024-07-28 NOTE — ED Notes (Signed)
Report given to Nicholas RN

## 2024-07-28 NOTE — ED Notes (Signed)
 Wound to left upper leg irrigated, meplex placed over

## 2024-07-28 NOTE — ED Provider Notes (Addendum)
 [place] EMERGENCY DEPARTMENT AT [facility] Provider Note   CSN: [id number] Arrival date & time: 07/28/24  0131     Patient presents with: Leg Pain   Scott Greer is a 68 y.o. male.   HPI     This is a 68 year old male who presents with pain in the lower extremities.  Patient reports pain and skin issues on the lower legs.  He uses a wheelchair.  Currently is homeless and does not have a stable place to stay.  Usually stays in a shelter.  He has not had any fevers.  Prior to Admission medications   Medication Sig Start Date End Date Taking? Authorizing Provider  amLODipine  (NORVASC ) 10 MG tablet Take 1 tablet (10 mg total) by mouth daily. 05/20/24   Cheryle Page, MD  aspirin  (ASPIRIN  LOW DOSE) 81 MG chewable tablet CHEW ONE TABLET BY MOUTH ONCE DAILY Needs appointment for further refills Patient not taking: Reported on 11/10/2023 10/25/22   Danton Jon HERO, PA-C  atorvastatin  (LIPITOR) 10 MG tablet Take 1 tablet (10 mg total) by mouth every evening. Patient not taking: Reported on 11/10/2023 12/14/22   Vicci Barnie NOVAK, MD  ferrous sulfate  (FEROSUL) 325 (65 FE) MG tablet TAKE ONE TABLET BY MOUTH EVERY MORNING WITH BREAKFAST Patient not taking: Reported on 11/10/2023 08/15/22   Vicci Barnie NOVAK, MD  furosemide  (LASIX ) 20 MG tablet Take 1 tablet (20 mg total) by mouth daily as needed for fluid. 07/16/24   Barrett, Jamie N, PA-C  gabapentin  (NEURONTIN ) 300 MG capsule Take 1 capsule (300 mg total) by mouth 2 (two) times daily. Patient not taking: Reported on 11/10/2023 01/17/23   Vicci Barnie NOVAK, MD  glimepiride  (AMARYL ) 1 MG tablet Take 2 tablets (2 mg total) by mouth daily with breakfast. Patient not taking: Reported on 11/10/2023 04/17/23   Danton Jon HERO, PA-C  hydrALAZINE  (APRESOLINE ) 25 MG tablet Take 1 tablet (25 mg total) by mouth 3 (three) times daily. 05/19/24 05/19/25  Cheryle Page, MD  oxyCODONE  (OXY IR/ROXICODONE ) 5 MG immediate release tablet Take 1 tablet (5  mg total) by mouth every 6 (six) hours as needed for severe pain (pain score 7-10). 05/19/24   Cheryle Page, MD  polyethylene glycol powder (MIRALAX ) 17 GM/SCOOP powder Take 17 g by mouth daily as needed for mild constipation or moderate constipation. 05/19/24   Cheryle Page, MD    Allergies: Bee venom and Metformin     Review of Systems  Constitutional:  Negative for fever.  Skin:  Positive for wound. Negative for color change.  All other systems reviewed and are negative.   Updated Vital Signs BP (!) 146/85   Pulse 89   Temp 98.2 F (36.8 C)   Resp 18   Ht 1.905 m (6' 3)   Wt 93.4 kg   SpO2 99%   BMI 25.74 kg/m   Physical Exam Vitals and nursing note reviewed.  Constitutional:      Appearance: He is well-developed.     Comments: Chronically ill-appearing but nontoxic  HENT:     Head: Normocephalic and atraumatic.  Eyes:     Pupils: Pupils are equal, round, and reactive to light.  Cardiovascular:     Rate and Rhythm: Normal rate and regular rhythm.  Pulmonary:     Effort: Pulmonary effort is normal. No respiratory distress.  Abdominal:     Palpations: Abdomen is soft.  Musculoskeletal:     Cervical back: Neck supple.     Comments: Bilateral BKA's  Lymphadenopathy:     Cervical: No cervical adenopathy.  Skin:    General: Skin is warm and dry.     Comments: Skin breakdown with excoriation over the posterior aspect of the bilateral upper legs, on the right leg there is some breakdown and ulceration without fluctuance or induration, no overlying skin changes or erythema  Neurological:     Mental Status: He is alert and oriented to person, place, and time.  Psychiatric:        Mood and Affect: Mood normal.     (all labs ordered are listed, but only abnormal results are displayed) Labs Reviewed  CBC WITH DIFFERENTIAL/PLATELET - Abnormal; Notable for the following components:      Result Value   RBC 2.83 (*)    Hemoglobin 7.9 (*)    HCT 24.1 (*)    RDW 16.0  (*)    All other components within normal limits  BASIC METABOLIC PANEL WITH GFR - Abnormal; Notable for the following components:   Glucose, Bld 264 (*)    BUN 36 (*)    Creatinine, Ser 2.42 (*)    GFR, Estimated 29 (*)    All other components within normal limits    EKG: None  Radiology: No results found.   Procedures   Medications Ordered in the ED  oxyCODONE -acetaminophen  (PERCOCET/ROXICET) 5-325 MG per tablet 1 tablet (1 tablet Oral Given 07/28/24 0349)    Clinical Course as of 07/28/24 0706  Tue Jul 28, 2024  0706 Patient states he has no place to go.  He is having difficulty caring for himself.  On last admission he indicated that he stayed in hotels until his money ran out.  He was seen by TOC at that point and given resources.  He is confined to a wheelchair and has new pressure ulcers.  Will have them evaluate for resources. [CH]    Clinical Course User Index [CH] Scott Greer, Charmaine FALCON, MD                                 Medical Decision Making Amount and/or Complexity of Data Reviewed Labs: ordered.  Risk Prescription drug management.   This patient presents to the ED for concern of leg pain, this involves an extensive number of treatment options, and is a complaint that carries with it a high risk of complications and morbidity.  I considered the following differential and admission for this acute, potentially life threatening condition.  The differential diagnosis includes cellulitis, skin breakdown, ulceration  MDM:    This is a 68 year old male with bilateral BKA's who presents with leg pain.  He is currently homeless.  Uses a wheelchair primarily.  Reports pain and ongoing phantom pain but also reports worsening pain specifically right upper leg.  On examination he has several what appear to be pressure injuries stage II on the posterior aspect of the leg which is likely related to his wheelchair.  No adjacent erythema.  Requested nursing to dress wounds and  apply Mepilex.  Labs obtained and largely at baseline.  Chronic anemia.  Chronic kidney disease..  No leukocytosis.  No significant crepitus of the skin.  Patient needs to follow-up with his primary doctor.  Recommend that he call for appointment as soon as possible.  Recommend cushioning and keeping his skin warm and dry.  7:06 AM Patient states he cannot go as he has no place to go.  Will consult TOC.  (  Labs, imaging, consults)  Labs: I Ordered, and personally interpreted labs.  The pertinent results include: CBC, BMP  Imaging Studies ordered: I ordered imaging studies including none I independently visualized and interpreted imaging. I agree with the radiologist interpretation  Additional history obtained from chart review.  External records from outside source obtained and reviewed including prior evaluations  Cardiac Monitoring: The patient was maintained on a cardiac monitor.  If on the cardiac monitor, I personally viewed and interpreted the cardiac monitored which showed an underlying rhythm of: Sinus  Reevaluation: After the interventions noted above, I reevaluated the patient and found that they have :improved  Social Determinants of Health:  homeless  Disposition: Discharge  Co morbidities that complicate the patient evaluation  Past Medical History:  Diagnosis Date   (HFpEF) heart failure with preserved ejection fraction (HCC)    CHF (congestive heart failure) (HCC)    CKD (chronic kidney disease), stage III (HCC)    Cocaine abuse (HCC)    DM (diabetes mellitus), type 2 (HCC)    Glaucoma 2015   HLD (hyperlipidemia)    Homelessness    Hx of BKA (HCC)    Bilat   Hyperlipidemia    Hypertension 2005   Marijuana abuse    Osteomyelitis (HCC)    Tobacco abuse    Wears glasses      Medicines Meds ordered this encounter  Medications   oxyCODONE -acetaminophen  (PERCOCET/ROXICET) 5-325 MG per tablet 1 tablet    Refill:  0    I have reviewed the patients home  medicines and have made adjustments as needed  Problem List / ED Course: Problem List Items Addressed This Visit   None Visit Diagnoses       Pressure injury of right leg, stage 2 (HCC)    -  Primary                Final diagnoses:  Pressure injury of right leg, stage 2 [facility])    ED Discharge Orders     None          Nimsi Males, Charmaine FALCON, MD 07/28/24 9374    Bari Charmaine FALCON, MD 07/28/24 [phone]

## 2024-07-28 NOTE — ED Triage Notes (Signed)
 Arrives via [place].   Bilateral BKA presenting with phantom pains.   Seeking assistance with social work.

## 2024-07-28 NOTE — ED Notes (Signed)
 Pt escorted to lobby by security, bus pass in hand.

## 2024-07-28 NOTE — ED Notes (Signed)
 Pt bib ems for bilateral BKA phantom pain x 3 months asking for social services. pt aaox4, bilateral BKA, disheveled in appearance. Call bell within reach

## 2024-07-30 ENCOUNTER — Emergency Department (HOSPITAL_COMMUNITY)
Admission: EM | Admit: 2024-07-30 | Discharge: 2024-07-30 | Disposition: A | Discharge status: Home / Self Care | Attending: Emergency Medicine | Admitting: Emergency Medicine

## 2024-07-30 DIAGNOSIS — Z711 Person with feared health complaint in whom no diagnosis is made: Secondary | ICD-10-CM | POA: Diagnosis present

## 2024-07-30 DIAGNOSIS — Z89511 Acquired absence of right leg below knee: Secondary | ICD-10-CM | POA: Diagnosis not present

## 2024-07-30 DIAGNOSIS — I509 Heart failure, unspecified: Secondary | ICD-10-CM | POA: Insufficient documentation

## 2024-07-30 DIAGNOSIS — L8990 Pressure ulcer of unspecified site, unspecified stage: Secondary | ICD-10-CM | POA: Diagnosis not present

## 2024-07-30 DIAGNOSIS — M7989 Other specified soft tissue disorders: Secondary | ICD-10-CM | POA: Diagnosis not present

## 2024-07-30 DIAGNOSIS — N189 Chronic kidney disease, unspecified: Secondary | ICD-10-CM | POA: Insufficient documentation

## 2024-07-30 DIAGNOSIS — Z7982 Long term (current) use of aspirin: Secondary | ICD-10-CM | POA: Insufficient documentation

## 2024-07-30 DIAGNOSIS — Z5900 Homelessness unspecified: Secondary | ICD-10-CM | POA: Diagnosis present

## 2024-07-30 DIAGNOSIS — M791 Myalgia, unspecified site: Secondary | ICD-10-CM | POA: Diagnosis not present

## 2024-07-30 DIAGNOSIS — Z993 Dependence on wheelchair: Secondary | ICD-10-CM | POA: Diagnosis not present

## 2024-07-30 DIAGNOSIS — E1122 Type 2 diabetes mellitus with diabetic chronic kidney disease: Secondary | ICD-10-CM | POA: Insufficient documentation

## 2024-07-30 DIAGNOSIS — N183 Chronic kidney disease, stage 3 unspecified: Secondary | ICD-10-CM | POA: Insufficient documentation

## 2024-07-30 DIAGNOSIS — L89891 Pressure ulcer of other site, stage 1: Secondary | ICD-10-CM | POA: Insufficient documentation

## 2024-07-30 DIAGNOSIS — Z7984 Long term (current) use of oral hypoglycemic drugs: Secondary | ICD-10-CM | POA: Insufficient documentation

## 2024-07-30 DIAGNOSIS — I5032 Chronic diastolic (congestive) heart failure: Secondary | ICD-10-CM | POA: Diagnosis not present

## 2024-07-30 DIAGNOSIS — Z89512 Acquired absence of left leg below knee: Secondary | ICD-10-CM | POA: Insufficient documentation

## 2024-07-30 DIAGNOSIS — Z79899 Other long term (current) drug therapy: Secondary | ICD-10-CM | POA: Insufficient documentation

## 2024-07-30 DIAGNOSIS — I130 Hypertensive heart and chronic kidney disease with heart failure and stage 1 through stage 4 chronic kidney disease, or unspecified chronic kidney disease: Secondary | ICD-10-CM | POA: Diagnosis not present

## 2024-07-30 NOTE — ED Triage Notes (Signed)
 Pt BIB [employer], pt reports bilateral leg swelling, bilateral BKA, pt reports pain all over his body as he has been sleeping on the ground

## 2024-07-30 NOTE — ED Notes (Signed)
 Pt changed out of wet clothing into paper scrubs, pt belongings with patient.

## 2024-07-30 NOTE — ED Triage Notes (Signed)
 Care  delayed because he was in the waiting room br taking a bath

## 2024-07-30 NOTE — ED Provider Notes (Signed)
 [place] EMERGENCY DEPARTMENT AT [facility] Provider Note   CSN: [id number] Arrival date & time: 07/30/24  0225     Patient presents with: Leg Swelling   Scott Greer is a 68 y.o. male.   HPI     This is a 68 year old male well-known to our emergency department who presents again because he was out in the rain sleeping on the ground.  Known to be homeless.  He has a bilateral BKA.  He was seen 2 nights ago by myself.  At that time he was complaining of leg pain.  Noted to have some skin breakdown posterior aspect of the bilateral legs right greater than left with pressure injury likely secondary to his wheelchair.  He states tonight he was sleeping on the ground and could not get up.  It was raining.  I consulted TOC 2 mornings ago.  There is no official note in the chart but per nursing notes he was to be discharged.  Previously been seen by TOC and provided with briefs and Chux to keep him dry.  He does not qualify for skilled nursing and does not qualify for an additional electric wheelchair.  Prior to Admission medications   Medication Sig Start Date End Date Taking? Authorizing Provider  amLODipine  (NORVASC ) 10 MG tablet Take 1 tablet (10 mg total) by mouth daily. 05/20/24   Cheryle Page, MD  aspirin  (ASPIRIN  LOW DOSE) 81 MG chewable tablet CHEW ONE TABLET BY MOUTH ONCE DAILY Needs appointment for further refills Patient not taking: Reported on 11/10/2023 10/25/22   Danton Jon HERO, PA-C  atorvastatin  (LIPITOR) 10 MG tablet Take 1 tablet (10 mg total) by mouth every evening. Patient not taking: Reported on 11/10/2023 12/14/22   Vicci Barnie NOVAK, MD  ferrous sulfate  (FEROSUL) 325 (65 FE) MG tablet TAKE ONE TABLET BY MOUTH EVERY MORNING WITH BREAKFAST Patient not taking: Reported on 11/10/2023 08/15/22   Vicci Barnie NOVAK, MD  furosemide  (LASIX ) 20 MG tablet Take 1 tablet (20 mg total) by mouth daily as needed for fluid. 07/16/24   Barrett, Warren SAILOR, PA-C  gabapentin   (NEURONTIN ) 300 MG capsule Take 1 capsule (300 mg total) by mouth 2 (two) times daily. Patient not taking: Reported on 11/10/2023 01/17/23   Vicci Barnie NOVAK, MD  glimepiride  (AMARYL ) 1 MG tablet Take 2 tablets (2 mg total) by mouth daily with breakfast. Patient not taking: Reported on 11/10/2023 04/17/23   Danton Jon HERO, PA-C  hydrALAZINE  (APRESOLINE ) 25 MG tablet Take 1 tablet (25 mg total) by mouth 3 (three) times daily. 05/19/24 05/19/25  Cheryle Page, MD  oxyCODONE  (OXY IR/ROXICODONE ) 5 MG immediate release tablet Take 1 tablet (5 mg total) by mouth every 6 (six) hours as needed for severe pain (pain score 7-10). 05/19/24   Cheryle Page, MD  polyethylene glycol powder (MIRALAX ) 17 GM/SCOOP powder Take 17 g by mouth daily as needed for mild constipation or moderate constipation. 05/19/24   Cheryle Page, MD    Allergies: Bee venom and Metformin     Review of Systems  Musculoskeletal:        Leg pain  All other systems reviewed and are negative.   Updated Vital Signs BP (!) 188/100 (BP Location: Right Arm)   Pulse 95   Temp 97.8 F (36.6 C) (Oral)   Resp 18   SpO2 100%   Physical Exam Vitals and nursing note reviewed.  Constitutional:      Appearance: He is well-developed.     Comments: Disheveled,  nontoxic-appearing  HENT:     Head: Normocephalic and atraumatic.  Eyes:     Pupils: Pupils are equal, round, and reactive to light.  Cardiovascular:     Rate and Rhythm: Normal rate and regular rhythm.  Pulmonary:     Effort: Pulmonary effort is normal. No respiratory distress.  Abdominal:     Palpations: Abdomen is soft.     Tenderness: There is no abdominal tenderness.  Musculoskeletal:     Cervical back: Neck supple.     Comments: Bilateral BKA, well-healed stumps  Lymphadenopathy:     Cervical: No cervical adenopathy.  Skin:    General: Skin is warm and dry.     Comments: Skin breakdown with pressure injury noted posterior right thigh, similar to prior examination,  no adjacent erythema  Neurological:     Mental Status: He is alert and oriented to person, place, and time.     (all labs ordered are listed, but only abnormal results are displayed) Labs Reviewed - No data to display  EKG: None  Radiology: No results found.   Procedures   Medications Ordered in the ED - No data to display                                  Medical Decision Making  This patient presents to the ED for concern of leg pain, this involves an extensive number of treatment options, and is a complaint that carries with it a high risk of complications and morbidity.  I considered the following differential and admission for this acute, potentially life threatening condition.  The differential diagnosis includes chronic pain, phantom pain, pain related to pressure injuries skin sores  MDM:    This is a 68 year old male known to our emergency department and homeless who presents after sleeping on the ground and was unable to get up.  Reports ongoing leg pain.  This is not new for him.  Pressure injuries over the posterior thighs appear similar to prior visit when I saw him 2 days ago.  No evidence of cellulitis.  Patient bathed in triage.  Suspect his visit today secondary to homelessness.  Unfortunately, there are not very many options for him based on review of prior case management notes.  Will again give him resources for shelter and provide him with brief and chux and dry clothes to keep him warm.  (Labs, imaging, consults)  Labs: I Ordered, and personally interpreted labs.  The pertinent results include: None  Imaging Studies ordered: I ordered imaging studies including none I independently visualized and interpreted imaging. I agree with the radiologist interpretation  Additional history obtained from chart review.  External records from outside source obtained and reviewed including evaluations and social work notes  Cardiac Monitoring: The patient was not  maintained on a cardiac monitor.  If on the cardiac monitor, I personally viewed and interpreted the cardiac monitored which showed an underlying rhythm of: N/A  Reevaluation: After the interventions noted above, I reevaluated the patient and found that they have :stayed the same  Social Determinants of Health:  homeless  Disposition: discharge  Co morbidities that complicate the patient evaluation  Past Medical History:  Diagnosis Date   (HFpEF) heart failure with preserved ejection fraction (HCC)    CHF (congestive heart failure) (HCC)    CKD (chronic kidney disease), stage III (HCC)    Cocaine abuse (HCC)    DM (diabetes mellitus),  type 2 (HCC)    Glaucoma 2015   HLD (hyperlipidemia)    Homelessness    Hx of BKA (HCC)    Bilat   Hyperlipidemia    Hypertension 2005   Marijuana abuse    Osteomyelitis (HCC)    Tobacco abuse    Wears glasses      Medicines No orders of the defined types were placed in this encounter.   I have reviewed the patients home medicines and have made adjustments as needed  Problem List / ED Course: Problem List Items Addressed This Visit   None Visit Diagnoses       Homelessness    -  Primary                Final diagnoses:  Homelessness    ED Discharge Orders     None          Bari Charmaine FALCON, MD 07/30/24 [phone]

## 2024-07-30 NOTE — Discharge Instructions (Signed)
 You were seen today because you are sleeping outside and homeless.  Emergency department is not an appropriate place to seek shelter.  Please see resources provided and once provided previously by our case management and social work Acupuncturist.

## 2024-08-04 NOTE — Progress Notes (Signed)
 Nursing screen

## 2024-08-23 ENCOUNTER — Encounter (HOSPITAL_COMMUNITY): Payer: Self-pay

## 2024-08-23 ENCOUNTER — Emergency Department (HOSPITAL_COMMUNITY)

## 2024-08-23 ENCOUNTER — Other Ambulatory Visit: Payer: Self-pay

## 2024-08-23 ENCOUNTER — Other Ambulatory Visit (HOSPITAL_COMMUNITY): Payer: Self-pay

## 2024-08-23 ENCOUNTER — Emergency Department (HOSPITAL_COMMUNITY)
Admission: EM | Admit: 2024-08-23 | Discharge: 2024-08-23 | Disposition: A | Discharge status: Home / Self Care | Attending: Emergency Medicine | Admitting: Emergency Medicine

## 2024-08-23 DIAGNOSIS — I130 Hypertensive heart and chronic kidney disease with heart failure and stage 1 through stage 4 chronic kidney disease, or unspecified chronic kidney disease: Secondary | ICD-10-CM | POA: Diagnosis not present

## 2024-08-23 DIAGNOSIS — E1122 Type 2 diabetes mellitus with diabetic chronic kidney disease: Secondary | ICD-10-CM | POA: Diagnosis not present

## 2024-08-23 DIAGNOSIS — Z7982 Long term (current) use of aspirin: Secondary | ICD-10-CM | POA: Diagnosis not present

## 2024-08-23 DIAGNOSIS — Z79899 Other long term (current) drug therapy: Secondary | ICD-10-CM | POA: Diagnosis not present

## 2024-08-23 DIAGNOSIS — N189 Chronic kidney disease, unspecified: Secondary | ICD-10-CM | POA: Insufficient documentation

## 2024-08-23 DIAGNOSIS — I509 Heart failure, unspecified: Secondary | ICD-10-CM | POA: Insufficient documentation

## 2024-08-23 DIAGNOSIS — S4991XA Unspecified injury of right shoulder and upper arm, initial encounter: Secondary | ICD-10-CM | POA: Insufficient documentation

## 2024-08-23 DIAGNOSIS — W050XXA Fall from non-moving wheelchair, initial encounter: Secondary | ICD-10-CM | POA: Insufficient documentation

## 2024-08-23 DIAGNOSIS — Z7984 Long term (current) use of oral hypoglycemic drugs: Secondary | ICD-10-CM | POA: Diagnosis not present

## 2024-08-23 MED ORDER — ACETAMINOPHEN 500 MG PO TABS
1000.0000 mg | ORAL_TABLET | Freq: Four times a day (QID) | ORAL | 0 refills | Status: AC | PRN
Start: 2024-08-23 — End: ?
  Filled 2024-08-23: qty 30, 4d supply, fill #0

## 2024-08-23 MED ORDER — ACETAMINOPHEN 500 MG PO TABS
1000.0000 mg | ORAL_TABLET | Freq: Once | ORAL | Status: AC
  Administered 2024-08-23: 1000 mg via ORAL
  Filled 2024-08-23: qty 2

## 2024-08-23 NOTE — Discharge Instructions (Signed)
 You have been evaluated for your fall.  Fortunately x-ray of your right shoulder did not show any broken bone or dislocation.  You may take Tylenol  as needed for pain.

## 2024-08-23 NOTE — ED Notes (Signed)
 Patient refused vital signs and pain score at discharge

## 2024-08-23 NOTE — ED Provider Notes (Signed)
 [place] EMERGENCY DEPARTMENT AT [facility] Provider Note   CSN: [id number] Arrival date & time: 08/23/24  9470     Patient presents with: Scott Greer is a 68 y.o. male.   The history is provided by the patient and medical records. No language interpreter was used.  Fall     68 year old male significant history of undomiciled, polysubstance use, diabetes, CHF, hypertension, CKD brought here via EMS for evaluation of a fall.  Patient states he lost balance and fell out of his wheelchair last night striking his shoulder.  Complaining of pain to the right shoulder and pain to both his legs.  He denies hitting his head or loss of consciousness.  He denies any numbness denies any confusion.  No other complaint.  Prior to Admission medications   Medication Sig Start Date End Date Taking? Authorizing Provider  amLODipine  (NORVASC ) 10 MG tablet Take 1 tablet (10 mg total) by mouth daily. 05/20/24   Cheryle Page, MD  aspirin  (ASPIRIN  LOW DOSE) 81 MG chewable tablet CHEW ONE TABLET BY MOUTH ONCE DAILY Needs appointment for further refills Patient not taking: Reported on 11/10/2023 10/25/22   Danton Jon HERO, PA-C  atorvastatin  (LIPITOR) 10 MG tablet Take 1 tablet (10 mg total) by mouth every evening. Patient not taking: Reported on 11/10/2023 12/14/22   Vicci Barnie NOVAK, MD  ferrous sulfate  [facility]) 325 (65 FE) MG tablet TAKE ONE TABLET BY MOUTH EVERY MORNING WITH BREAKFAST Patient not taking: Reported on 11/10/2023 08/15/22   Vicci Barnie NOVAK, MD  furosemide  (LASIX ) 20 MG tablet Take 1 tablet (20 mg total) by mouth daily as needed for fluid. 07/16/24   Barrett, Warren SAILOR, PA-C  gabapentin  (NEURONTIN ) 300 MG capsule Take 1 capsule (300 mg total) by mouth 2 (two) times daily. Patient not taking: Reported on 11/10/2023 01/17/23   Vicci Barnie NOVAK, MD  glimepiride  (AMARYL ) 1 MG tablet Take 2 tablets (2 mg total) by mouth daily with breakfast. Patient not taking: Reported on  11/10/2023 04/17/23   Danton Jon HERO, PA-C  hydrALAZINE  (APRESOLINE ) 25 MG tablet Take 1 tablet (25 mg total) by mouth 3 (three) times daily. 05/19/24 05/19/25  Cheryle Page, MD  oxyCODONE  (OXY IR/ROXICODONE ) 5 MG immediate release tablet Take 1 tablet (5 mg total) by mouth every 6 (six) hours as needed for severe pain (pain score 7-10). 05/19/24   Cheryle Page, MD  polyethylene glycol powder (MIRALAX ) 17 GM/SCOOP powder Take 17 g by mouth daily as needed for mild constipation or moderate constipation. 05/19/24   Cheryle Page, MD    Allergies: Bee venom and Metformin     Review of Systems  Constitutional:  Negative for fever.  Musculoskeletal:  Positive for arthralgias.    Updated Vital Signs BP (!) 151/88 (BP Location: Left Arm)   Pulse 87   Temp 98.7 F (37.1 C) (Oral)   Resp 18   SpO2 99%   Physical Exam Constitutional:      General: He is not in acute distress.    Appearance: He is well-developed.     Comments: Patient is sleeping in his wheelchair, easily arousable in no acute discomfort.  HENT:     Head: Atraumatic.     Comments: No scalp tenderness Eyes:     Conjunctiva/sclera: Conjunctivae normal.  Neck:     Comments: No cervical midline spine tenderness Musculoskeletal:        General: Tenderness (Tenderness about the right shoulder with full range of motion and no  deformity noted.  No overlying skin changes no rash no laceration no bruising.) present.     Cervical back: Normal range of motion and neck supple.     Comments: Examination of bilateral lower leg without reproducible pain  Skin:    Findings: No rash.  Neurological:     Mental Status: He is alert.     (all labs ordered are listed, but only abnormal results are displayed) Labs Reviewed - No data to display  EKG: None  Radiology: DG Shoulder Right Result Date: 08/23/2024 EXAM: 1 VIEW XRAY OF THE RIGHT SHOULDER 08/23/2024 07:09:00 AM COMPARISON: Right shoulder series 04/08/2023. CLINICAL HISTORY:  Fall. Patient presents via EMS complaining of mechanical fall from wheelchair. Patient complains of right shoulder pain. FINDINGS: BONES AND JOINTS: Glenohumeral joint is normally aligned. No acute fracture or dislocation. There is moderately developed arthrosis of the glenohumeral joint with hypertrophic spurring along the greater tuberosity. Narrowing, osteophytes, and at least 1 loose body are noted of the [facility] joint, unchanged. Interval increased distal acromial inferior spurring. There is preservation of the normal acromiohumeral space. SOFT TISSUES: No abnormal calcifications. Visualized lung is unremarkable. There is a small metallic density superimposing in the outer right chest possibly intrathoracic or overlying. This could be an artifact or ballistic fragment and was not seen last year. IMPRESSION: 1. No acute fracture or dislocation. 2. Moderately developed glenohumeral joint arthrosis with hypertrophic spurring along the greater tuberosity and interval increased distal acromial inferior spurring. 3. Acromioclavicular joint narrowing, osteophytes, and at least one loose body, unchanged. 4. Small metallic density projecting over the outer right chest that may be intrathoracic or external; new from prior and may warrant correlation for possible retained fragment or artifact. Electronically signed by: Francis Quam MD 08/23/2024 07:19 AM EDT RP Workstation: [id number]     Procedures   Medications Ordered in the ED  acetaminophen  (TYLENOL ) tablet 1,000 mg (1,000 mg Oral Given 08/23/24 9196)                                    Medical Decision Making Amount and/or Complexity of Data Reviewed Radiology: ordered.  Risk OTC drugs.   BP (!) 184/87   Pulse 90   Temp 98.7 F (37.1 C) (Oral)   Resp 18   SpO2 99%   65:17 AM   68 year old male significant history of undomiciled, polysubstance use, diabetes, CHF, hypertension, CKD brought here via EMS for evaluation of a fall.  Patient states  he lost balance and fell out of his wheelchair last night striking his shoulder.  Complaining of pain to the right shoulder and pain to both his legs.  He denies hitting his head or loss of consciousness.  He denies any numbness denies any confusion.  No other complaint.  Exam of the right shoulder with some tenderness to palpation around the deltoid region but patient able to perform full range of motion about the shoulder without any deformity noted.  No overlying skin changes.  X-ray of the right shoulder obtained independent viewed interpreted by me which shows no acute fracture or dislocation.  Patient does have degenerative changes that were noted in the x-ray but this appears to be chronic.  There is a small metallic density project over the the outer right chest.  This is in relation to a necklace that he is currently wearing.  No embedded foreign body noted on my exam.  Patient given Tylenol ,  RICE therapy discussed but otherwise he is stable for discharge.  Please note his blood pressure is elevated today at 184/87.     Final diagnoses:  Right shoulder injury, initial encounter    ED Discharge Orders          Ordered    acetaminophen  (TYLENOL ) 500 MG tablet  Every 6 hours PRN        08/23/24 0824               Nivia Colon, PA-C 08/23/24 0825    Rogelia Jerilynn RAMAN, MD 08/23/24 1015

## 2024-08-23 NOTE — ED Triage Notes (Signed)
 Pt presents via EMS c/o mechanical fall from wheelchair. C/o right shoulder pain and bilateral leg pain. Denies LOC from fall. Pt well known to this department with 18 ED visits in the last 6 months.

## 2024-08-24 ENCOUNTER — Other Ambulatory Visit (HOSPITAL_COMMUNITY): Payer: Self-pay

## 2024-08-29 ENCOUNTER — Encounter (HOSPITAL_COMMUNITY): Payer: Self-pay

## 2024-08-29 ENCOUNTER — Emergency Department (HOSPITAL_COMMUNITY)

## 2024-08-29 ENCOUNTER — Other Ambulatory Visit: Payer: Self-pay

## 2024-08-29 ENCOUNTER — Emergency Department (HOSPITAL_COMMUNITY)
Admission: EM | Admit: 2024-08-29 | Discharge: 2024-08-29 | Disposition: A | Discharge status: Home / Self Care | Attending: Emergency Medicine | Admitting: Emergency Medicine

## 2024-08-29 DIAGNOSIS — Z5900 Homelessness unspecified: Secondary | ICD-10-CM | POA: Insufficient documentation

## 2024-08-29 DIAGNOSIS — W172XXA Fall into hole, initial encounter: Secondary | ICD-10-CM | POA: Diagnosis not present

## 2024-08-29 DIAGNOSIS — I5030 Unspecified diastolic (congestive) heart failure: Secondary | ICD-10-CM | POA: Diagnosis not present

## 2024-08-29 DIAGNOSIS — Z79899 Other long term (current) drug therapy: Secondary | ICD-10-CM | POA: Insufficient documentation

## 2024-08-29 DIAGNOSIS — S161XXA Strain of muscle, fascia and tendon at neck level, initial encounter: Secondary | ICD-10-CM | POA: Insufficient documentation

## 2024-08-29 DIAGNOSIS — I130 Hypertensive heart and chronic kidney disease with heart failure and stage 1 through stage 4 chronic kidney disease, or unspecified chronic kidney disease: Secondary | ICD-10-CM | POA: Insufficient documentation

## 2024-08-29 DIAGNOSIS — E1122 Type 2 diabetes mellitus with diabetic chronic kidney disease: Secondary | ICD-10-CM | POA: Insufficient documentation

## 2024-08-29 DIAGNOSIS — S39012A Strain of muscle, fascia and tendon of lower back, initial encounter: Secondary | ICD-10-CM | POA: Diagnosis not present

## 2024-08-29 DIAGNOSIS — S29012A Strain of muscle and tendon of back wall of thorax, initial encounter: Secondary | ICD-10-CM | POA: Diagnosis not present

## 2024-08-29 DIAGNOSIS — N183 Chronic kidney disease, stage 3 unspecified: Secondary | ICD-10-CM | POA: Insufficient documentation

## 2024-08-29 DIAGNOSIS — S060X0A Concussion without loss of consciousness, initial encounter: Secondary | ICD-10-CM | POA: Insufficient documentation

## 2024-08-29 DIAGNOSIS — W19XXXA Unspecified fall, initial encounter: Secondary | ICD-10-CM

## 2024-08-29 NOTE — ED Provider Notes (Signed)
 [place] EMERGENCY DEPARTMENT AT [facility] Provider Note   CSN: [id number] Arrival date & time: 08/29/24  0025     Patient presents with: Fall and Back Pain   Scott Greer is a 68 y.o. male.   The history is provided by the patient.  Patient with extensive history including congestive heart failure, chronic kidney disease, previous history of cocaine use, diabetes, unhoused, bilateral BKA presents after fall.  Patient was in his wheelchair when he fell down an embankment around 4 feet.  He is reporting back pain and left shoulder pain.  No LOC.  Patient is not on anticoagulation    Past Medical History:  Diagnosis Date   (HFpEF) heart failure with preserved ejection fraction (HCC)    CHF (congestive heart failure) (HCC)    CKD (chronic kidney disease), stage III (HCC)    Cocaine abuse (HCC)    DM (diabetes mellitus), type 2 (HCC)    Glaucoma 2015   HLD (hyperlipidemia)    Homelessness    Hx of BKA (HCC)    Bilat   Hyperlipidemia    Hypertension 2005   Marijuana abuse    Osteomyelitis (HCC)    Tobacco abuse    Wears glasses     Prior to Admission medications   Medication Sig Start Date End Date Taking? Authorizing Provider  acetaminophen  (TYLENOL ) 500 MG tablet Take 2 tablets (1,000 mg total) by mouth every 6 (six) hours as needed for mild pain (pain score 1-3) or moderate pain (pain score 4-6). 08/23/24   Nivia Colon, PA-C  amLODipine  (NORVASC ) 10 MG tablet Take 1 tablet (10 mg total) by mouth daily. 05/20/24   Cheryle Page, MD  furosemide  (LASIX ) 20 MG tablet Take 1 tablet (20 mg total) by mouth daily as needed for fluid. 07/16/24   Barrett, Warren SAILOR, PA-C  hydrALAZINE  (APRESOLINE ) 25 MG tablet Take 1 tablet (25 mg total) by mouth 3 (three) times daily. 05/19/24 05/19/25  Cheryle Page, MD    Allergies: Bee venom and Metformin     Review of Systems  Musculoskeletal:  Positive for back pain.    Updated Vital Signs BP (!) 144/74   Pulse 90   Temp  97.8 F (36.6 C) (Oral)   Resp 18   Ht 1.753 m (5' 9)   Wt 99.8 kg   SpO2 100%   BMI 32.49 kg/m   Physical Exam CONSTITUTIONAL: Disheveled, moaning in pain HEAD: Normocephalic/atraumatic, no visible head trauma Eyes - no traumatic injury, chronic disconjugate gaze ENMT: Mucous membranes moist NECK: supple no meningeal signs SPINE/BACK: Diffuse cervical thoracic and lumbar tenderness No bruising/crepitance/stepoffs noted to spine Patient maintained in spinal precautions/logroll utilized CV: S1/S2 noted, no murmurs/rubs/gallops noted LUNGS: Lungs are clear to auscultation bilaterally, no apparent distress Chest-no tenderness ABDOMEN: soft, nontender NEURO: Pt is awake/alert/appropriate, moves all extremitiesx4.   EXTREMITIES: Bilateral BKA noted Tenderness noted to palpation of left shoulder though no deformity and full range of motion All other extremities/joints palpated/ranged and nontender Pelvis appears stable SKIN: warm, color normal PSYCH: anxious  (all labs ordered are listed, but only abnormal results are displayed) Labs Reviewed - No data to display   EKG: None  Radiology: CT Lumbar Spine Wo Contrast Result Date: 08/29/2024 EXAM: CT OF THE LUMBAR SPINE WITHOUT CONTRAST 08/29/2024 04:34:07 AM TECHNIQUE: CT of the lumbar spine was performed without the administration of intravenous contrast. Multiplanar reformatted images are provided for review. Automated exposure control, iterative reconstruction, and/or weight based adjustment of the mA/kV was  utilized to reduce the radiation dose to as low as reasonably achievable. COMPARISON: Cervical and thoracic spine CT today reported separately. CLINICAL HISTORY: 68 year old male. Back trauma, fall into a construction site, complaining of back pain. FINDINGS: BONES AND ALIGNMENT: Normal vertebral body heights. No acute fracture or suspicious bone lesion. Normal alignment. Maintained lumbar lordosis. Transitional lumbosacral  anatomy when correlating with the cervical and thoracic exams today fully sacralized L5 level. Correlation with radiographs is recommended prior to any operative intervention. Visible sacroiliac joints appear intact. DEGENERATIVE CHANGES: Mild for age lumbar spinal degeneration. No CT evidence of lumbar spinal stenosis. SOFT TISSUES: Negative lumbar paraspinal soft tissues. VASCULATURE: Calcified aortoiliac atherosclerosis. ABDOMINAL VISCERA: Negative visible noncontrast abdominal viscera. IMPRESSION: 1. No acute traumatic injury identified in the lumbar spine. 2. 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 3. Mild for age lumbar spinal degeneration without CT evidence of lumbar spinal stenosis. Electronically signed by: Helayne Hurst MD 08/29/2024 04:55 AM EDT RP Workstation: [id number]   CT Thoracic Spine Wo Contrast Result Date: 08/29/2024 EXAM: CT Thoracic Spine Without Contrast 08/29/2024 04:34:07 AM TECHNIQUE: CT of the thoracic spine was performed without the administration of intravenous contrast. Multiplanar reformatted images are provided for review. Automated exposure control, iterative reconstruction, and/or weight based adjustment of the mA/kV was utilized to reduce the radiation dose to as low as reasonably achievable. COMPARISON: Cervical spine CT on 08/29/2024 reported separately. CLINICAL HISTORY: 68 year old male, back trauma. FINDINGS: BONES AND ALIGNMENT: Thoracic segmentation appears normal. Maintained vertebral height. No thoracic vertebral fracture identified and visible posterior ribs appear intact. DEGENERATIVE CHANGES: Generally mild for age thoracic spine degeneration. However, lower thoracic disc bulging and endplate spurring including at T9-T10. T10-T11 disc bulging and endplate spurring with superimposed moderate lower thoracic posterior element degenerative hypertrophy likely results in mild multifactorial spinal stenosis (series 6, image 148). SOFT TISSUES: Mild  respiratory motion artifact. Visible major airways and lungs are essentially clear, with mild symmetric dependent atelectasis. Negative visible noncontrast upper abdominal fissura aside from calcified aortic atherosclerosis. IMPRESSION: 1. No acute traumatic injury identified in the thoracic spine. 2. 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 Electronically signed by: Helayne Hurst MD 08/29/2024 04:51 AM EDT RP Workstation: [id number]   CT HEAD WO CONTRAST Result Date: 08/29/2024 EXAM: CT HEAD AND CERVICAL SPINE 08/29/2024 04:34:07 AM TECHNIQUE: CT of the head and cervical spine was performed without the administration of intravenous contrast. Multiplanar reformatted images are provided for review. Automated exposure control, iterative reconstruction, and/or weight-based adjustment of the mA/kV was utilized to reduce the radiation dose to as low as reasonably achievable. COMPARISON: Comparison made to June 16, 2024. CLINICAL HISTORY: Head trauma, moderate-severe. Patient arrived via [facility] after a fall into a construction site approximately 4 feet. Patient complaining of back pain. Patient is BKA bilateral, patient was in wheelchair and fell in hole. No LOC. Patient states no pain besides upper back and shoulders. Patient VSS. Best possible images due to patient being unwilling to cooperate. While reviewing images, it was noticed what appeared to be a knife on the patient's right side. Patient was sleeping so it was removed from the patient's coat pocket and bagged. After taking patient back, it was handed off to the nurses. FINDINGS: CT HEAD BRAIN AND VENTRICLES: Parenchymal volume loss is commensurate with the patient's age. Periventricular white matter changes are present, likely reflecting the sequela of small vessel ischemia. 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 No acute intracranial hemorrhage. No mass effect  or midline shift. No abnormal extra-axial  fluid collection. No evidence of acute infarct. No hydrocephalus. ORBITS: No acute abnormality. SINUSES AND MASTOIDS: Mastoid air cells and middle ear cavities are clear. No acute abnormality. SOFT TISSUES AND SKULL: Stable scarring within the suboccipital scalp. Disconjugate gaze again noted. No acute skull fracture. No acute soft tissue abnormality. Chronic left medial abdominal wall fracture again noted. CT CERVICAL SPINE BONES AND ALIGNMENT: No acute fracture or traumatic malalignment. DEGENERATIVE CHANGES: Mild degenerative disc disease at C3-C6, most severe at C3-4. 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 Superimposed mild broad-based posterior disc bulge at C3-4 results in moderate central canal stenosis with an AP diameter of the spinal canal of 6 mm and flattening of the thecal sac. Similarly, though milder changes noted at C2-3 and C4-5. No high-grade neural foraminal narrowing. SOFT TISSUES: No prevertebral soft tissue swelling. IMPRESSION: 1. No acute intracranial abnormality. 2. No acute fracture or traumatic malalignment of the cervical spine. 3. Mild degenerative disc disease at C3C6, most severe at C34, with moderate central canal stenosis at C34 (AP canal diameter approximately 6 mm). Electronically signed by: Dorethia Molt MD 08/29/2024 04:41 AM EDT RP Workstation: [id number]   CT Cervical Spine Wo Contrast Result Date: 08/29/2024 EXAM: CT HEAD AND CERVICAL SPINE 08/29/2024 04:34:07 AM TECHNIQUE: CT of the head and cervical spine was performed without the administration of intravenous contrast. Multiplanar reformatted images are provided for review. Automated exposure control, iterative reconstruction, and/or weight-based adjustment of the mA/kV was utilized to reduce the radiation dose to as low as reasonably achievable. COMPARISON: Comparison made to June 16, 2024. CLINICAL HISTORY: Head trauma, moderate-severe. Patient arrived via [facility] after a fall into a construction site  approximately 4 feet. Patient complaining of back pain. Patient is BKA bilateral, patient was in wheelchair and fell in hole. No LOC. Patient states no pain besides upper back and shoulders. Patient VSS. Best possible images due to patient being unwilling to cooperate. While reviewing images, it was noticed what appeared to be a knife on the patient's right side. Patient was sleeping so it was removed from the patient's coat pocket and bagged. After taking patient back, it was handed off to the nurses. FINDINGS: CT HEAD BRAIN AND VENTRICLES: Parenchymal volume loss is commensurate with the patient's age. Periventricular white matter changes are present, likely reflecting the sequela of small vessel ischemia. 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 No acute intracranial hemorrhage. No mass effect or midline shift. No abnormal extra-axial fluid collection. No evidence of acute infarct. No hydrocephalus. ORBITS: No acute abnormality. SINUSES AND MASTOIDS: Mastoid air cells and middle ear cavities are clear. No acute abnormality. SOFT TISSUES AND SKULL: Stable scarring within the suboccipital scalp. Disconjugate gaze again noted. No acute skull fracture. No acute soft tissue abnormality. Chronic left medial abdominal wall fracture again noted. CT CERVICAL SPINE BONES AND ALIGNMENT: No acute fracture or traumatic malalignment. DEGENERATIVE CHANGES: Mild degenerative disc disease at C3-C6, most severe at C3-4. 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 Superimposed mild broad-based posterior disc bulge at C3-4 results in moderate central canal stenosis with an AP diameter of the spinal canal of 6 mm and flattening of the thecal sac. Similarly, though milder changes noted at C2-3 and C4-5. No high-grade neural foraminal narrowing. SOFT TISSUES: No prevertebral soft tissue swelling. IMPRESSION: 1. No acute intracranial abnormality. 2. No acute fracture or traumatic malalignment of the cervical  spine. 3. Mild degenerative disc disease at C3C6, most severe at C34, with  moderate central canal stenosis at C34 (AP canal diameter approximately 6 mm). Electronically signed by: Dorethia Molt MD 08/29/2024 04:41 AM EDT RP Workstation: [id number]   DG Pelvis Portable Result Date: 08/29/2024 EXAM: 1 or 2 VIEW(S) XRAY OF THE PELVIS 08/29/2024 01:22:00 AM COMPARISON: 06/16/2024 CLINICAL HISTORY: PT arrived via [facility] after a fall into a construction site aprox 6foot. PT complaining of back pain. PT is BKA bilateral, PT was in wheelchair and fell in hole. No LOC. PT states no pain besides upper back and shoulders. FINDINGS: BONES AND JOINTS: No acute fracture. No focal osseous lesion. No joint dislocation. Mild degenerative changes of the bilateral hips. SOFT TISSUES: The soft tissues are unremarkable. IMPRESSION: 1. No acute osseous abnormality. Electronically signed by: Pinkie Pebbles MD 08/29/2024 01:31 AM EDT RP Workstation: [id number]   DG Shoulder Left Port Result Date: 08/29/2024 EXAM: 1 VIEW XRAY OF THE LEFT SHOULDER 08/29/2024 01:22:00 AM COMPARISON: None available. CLINICAL HISTORY: Fall into holiday representative site FINDINGS: BONES AND JOINTS: Glenohumeral joint is normally aligned. No acute fracture or dislocation. Moderate degenerative changes of the acromioclavicular joint. SOFT TISSUES: No abnormal calcifications. Visualized lung is unremarkable. IMPRESSION: 1. No acute fracture or dislocation. Electronically signed by: Pinkie Pebbles MD 08/29/2024 01:30 AM EDT RP Workstation: [id number]   DG Chest Port 1 View Result Date: 08/29/2024 EXAM: 1 VIEW(S) XRAY OF THE CHEST 08/29/2024 01:22:00 AM COMPARISON: 06/16/2024 CLINICAL HISTORY: PT arrived via [facility] after a fall into a construction site aprox 29foot. PT complaining of back pain. PT is BKA bilateral, PT was in wheelchair and fell in hole. No LOC. PT states no pain besides upper back and shoulders. FINDINGS: LUNGS AND PLEURA: No focal pulmonary  opacity. No pulmonary edema. No pleural effusion. No pneumothorax. HEART AND MEDIASTINUM: No acute abnormality of the cardiac and mediastinal silhouettes. BONES AND SOFT TISSUES: No acute osseous abnormality. IMPRESSION: 1. No acute cardiopulmonary process. Electronically signed by: Pinkie Pebbles MD 08/29/2024 01:29 AM EDT RP Workstation: [id number]     Procedures   Medications Ordered in the ED - No data to display  Clinical Course as of 08/29/24 0550  Sat Aug 29, 2024  0234 Patient presents after he fell off his wheelchair.  He is a very poor historian He attempted get CT imaging but he became agitated and yelling and cursing at CT staff.  He then proceeded to urinate on the table. On my reassessment patient sleeping but arousable and becomes aggressive We will continue to monitor [DW]    Clinical Course User Index [DW] Midge Golas, MD                                 Medical Decision Making Amount and/or Complexity of Data Reviewed Labs: ordered. Radiology: ordered.   This patient presents to the ED for concern of fall, this involves an extensive number of treatment options, and is a complaint that carries with it a high risk of complications and morbidity.  The differential diagnosis includes but is not limited to subdural hematoma, subarachnoid hemorrhage, skull fracture, concussion, cervical spine fracture, thoracic spine fracture, lumbar spine fracture   Comorbidities that complicate the patient evaluation: Patient's presentation is complicated by their history of CHF, chronic kidney disease  Social Determinants of Health: Patient's unhoused and substance use disorder  increases the complexity of managing their presentation  Additional history obtained: Records reviewed previous admission documents  Imaging Studies ordered: I ordered imaging studies including CT scan  head, C-spine, thoracic and lumbar  I independently visualized and interpreted imaging which  showed no acute traumatic injuries I agree with the radiologist interpretation  Reevaluation: After the interventions noted above, I reevaluated the patient and found that they have :stayed the same  Complexity of problems addressed: Patient's presentation is most consistent with  acute presentation with potential threat to life or bodily function  Disposition: After consideration of the diagnostic results and the patient's response to treatment,  I feel that the patent would benefit from discharge  .   5:51 AM At time of discharge patient is resting comfortably, easily arousable in no acute distress.  No signs of acute traumatic injuries.  No signs of any acute chest or abdominal trauma. Patient moves all 4 extremity without difficulty.     Final diagnoses:  Fall, initial encounter  Concussion without loss of consciousness, initial encounter  Strain of neck muscle, initial encounter  Back strain, initial encounter    ED Discharge Orders     None          Midge Golas, MD 08/29/24 [phone]

## 2024-08-29 NOTE — ED Notes (Signed)
 Pt taken to CT where he was returned by CT tech, tech came to nurses station holding a straight blade knife that CT stated was visible during scans. Knife kept at charge desk until security called

## 2024-08-29 NOTE — ED Triage Notes (Signed)
 PT arrived via [facility] after a fall into a construction site aprox 23foot. PT complaining of back pain. PT is BKA bilateral, PT was in wheelchair and fell in hole. No LOC. PT states no pain besides upper back and shoulders. PT VSS

## 2024-08-29 NOTE — ED Notes (Signed)
 This phleb attempted to get labs, pt ignored phleb, unable to collect labs

## 2024-08-30 ENCOUNTER — Encounter (HOSPITAL_COMMUNITY): Payer: Self-pay

## 2024-08-30 ENCOUNTER — Emergency Department (HOSPITAL_COMMUNITY)
Admission: EM | Admit: 2024-08-30 | Discharge: 2024-08-31 | Disposition: A | Attending: Emergency Medicine | Admitting: Emergency Medicine

## 2024-08-30 ENCOUNTER — Other Ambulatory Visit: Payer: Self-pay

## 2024-08-30 DIAGNOSIS — I130 Hypertensive heart and chronic kidney disease with heart failure and stage 1 through stage 4 chronic kidney disease, or unspecified chronic kidney disease: Secondary | ICD-10-CM | POA: Diagnosis not present

## 2024-08-30 DIAGNOSIS — E1122 Type 2 diabetes mellitus with diabetic chronic kidney disease: Secondary | ICD-10-CM | POA: Insufficient documentation

## 2024-08-30 DIAGNOSIS — I509 Heart failure, unspecified: Secondary | ICD-10-CM | POA: Diagnosis not present

## 2024-08-30 DIAGNOSIS — M791 Myalgia, unspecified site: Secondary | ICD-10-CM | POA: Insufficient documentation

## 2024-08-30 DIAGNOSIS — N189 Chronic kidney disease, unspecified: Secondary | ICD-10-CM | POA: Diagnosis not present

## 2024-08-30 DIAGNOSIS — Z5900 Homelessness unspecified: Secondary | ICD-10-CM | POA: Diagnosis not present

## 2024-08-30 DIAGNOSIS — Z79899 Other long term (current) drug therapy: Secondary | ICD-10-CM | POA: Diagnosis not present

## 2024-08-30 NOTE — ED Triage Notes (Signed)
 Patient bib EMS from the side of the road with complaints of body pain all over. Patient states he hurts all over from a fall on Friday. He was here yesterday for the same. EMS reports VSS

## 2024-08-30 NOTE — ED Notes (Signed)
 Patient and patients belongings wanded for weapons. Knife and lighter were found. Security has possession of both items.

## 2024-08-30 NOTE — ED Provider Notes (Signed)
 [place] [facility] AT [facility] Provider Note   CSN: [id number] Arrival date & time: 08/30/24  8158     Patient presents with: No chief complaint on file.   Scott Greer is a 68 y.o. Greer.   Patient is a 68 year Greer Greer who presents with body pain.  He has a history of hypertension, CHF, chronic kidney disease, diabetes, cocaine use, bilateral BKA's, homelessness.  He said 2 days ago he fell out of his wheelchair after his wheelchair hit a ravine.  He was seen here earlier today for similar symptoms.  He had extensive imaging including CT of the lumbar spine, cervical spine, thoracic spine, head as well as x-rays of his left shoulder and pelvis.  These were negative for acute traumatic injuries.  He says he still having pain.  Although on my exam, he is snoring and he wakes up, me to stop bothering him.  He denies any new injuries.       Prior to Admission medications   Medication Sig Start Date End Date Taking? Authorizing Provider  acetaminophen  (TYLENOL ) 500 MG tablet Take 2 tablets (1,000 mg total) by mouth every 6 (six) hours as needed for mild pain (pain score 1-3) or moderate pain (pain score 4-6). 08/23/24   Nivia Colon, PA-C  amLODipine  (NORVASC ) 10 MG tablet Take 1 tablet (10 mg total) by mouth daily. 05/20/24   Cheryle Page, MD  furosemide  (LASIX ) 20 MG tablet Take 1 tablet (20 mg total) by mouth daily as needed for fluid. 07/16/24   Barrett, Warren SAILOR, PA-C  hydrALAZINE  (APRESOLINE ) 25 MG tablet Take 1 tablet (25 mg total) by mouth 3 (three) times daily. 05/19/24 05/19/25  Cheryle Page, MD    Allergies: Bee venom and Metformin     Review of Systems  Constitutional:  Negative for fever.  Respiratory:  Negative for shortness of breath.   Cardiovascular:  Negative for chest pain.  Gastrointestinal:  Negative for abdominal pain and vomiting.  Musculoskeletal:  Positive for arthralgias, back pain and neck pain. Negative for joint swelling.  Skin:   Negative for wound.  Neurological:  Negative for headaches.    Updated Vital Signs BP (!) 141/76   Pulse 81   Temp 98.6 F (37 C) (Oral)   Resp 17   Ht 5' 9 (1.753 m)   Wt 99.Scott kg   SpO2 100%   BMI 32.49 kg/m   Physical Exam Constitutional:      Appearance: He is well-developed.     Comments: Disheveled, smells of urine  HENT:     Head: Normocephalic and atraumatic.  Eyes:     Pupils: Pupils are equal, round, and reactive to light.  Neck:     Comments: Diffuse tenderness to his neck and back Cardiovascular:     Rate and Rhythm: Normal rate and regular rhythm.     Heart sounds: Normal heart sounds.  Pulmonary:     Effort: Pulmonary effort is normal. No respiratory distress.     Breath sounds: Normal breath sounds. No wheezing or rales.  Chest:     Chest wall: No tenderness.  Abdominal:     General: Bowel sounds are normal.     Palpations: Abdomen is soft.     Tenderness: There is no abdominal tenderness. There is no guarding or rebound.  Musculoskeletal:        General: Normal range of motion.     Cervical back: Normal range of motion and neck supple.  Comments: Bilateral BKA's, no obvious swelling or deformity to his extremities  Lymphadenopathy:     Cervical: No cervical adenopathy.  Skin:    General: Skin is warm and dry.     Findings: No rash.  Neurological:     Mental Status: He is alert and oriented to person, place, and time.     (all labs ordered are listed, but only abnormal results are displayed) Labs Reviewed - No data to display  EKG: None  Radiology: CT Lumbar Spine Wo Contrast Result Date: 08/29/2024 EXAM: CT OF THE LUMBAR SPINE WITHOUT CONTRAST 08/29/2024 04:34:07 AM TECHNIQUE: CT of the lumbar spine was performed without the administration of intravenous contrast. Multiplanar reformatted images are provided for review. Automated exposure control, iterative reconstruction, and/or weight based adjustment of the mA/kV was utilized to reduce  the radiation dose to as low as reasonably achievable. COMPARISON: Cervical and thoracic spine CT today reported separately. CLINICAL HISTORY: 68 year Greer Greer. Back trauma, fall into a construction site, complaining of back pain. FINDINGS: BONES AND ALIGNMENT: Normal vertebral body heights. No acute fracture or suspicious bone lesion. Normal alignment. Maintained lumbar lordosis. Transitional lumbosacral anatomy when correlating with the cervical and thoracic exams today fully sacralized L5 level. Correlation with radiographs is recommended prior to any operative intervention. Visible sacroiliac joints appear intact. DEGENERATIVE CHANGES: Mild for age lumbar spinal degeneration. No CT evidence of lumbar spinal stenosis. SOFT TISSUES: Negative lumbar paraspinal soft tissues. VASCULATURE: Calcified aortoiliac atherosclerosis. ABDOMINAL VISCERA: Negative visible noncontrast abdominal viscera. IMPRESSION: 1. No acute traumatic injury identified in the lumbar spine. 2. 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 3. Mild for age lumbar spinal degeneration without CT evidence of lumbar spinal stenosis. Electronically signed by: Helayne Hurst MD 08/29/2024 04:55 AM EDT RP Workstation: [id number]   CT Thoracic Spine Wo Contrast Result Date: 08/29/2024 EXAM: CT Thoracic Spine Without Contrast 08/29/2024 04:34:07 AM TECHNIQUE: CT of the thoracic spine was performed without the administration of intravenous contrast. Multiplanar reformatted images are provided for review. Automated exposure control, iterative reconstruction, and/or weight based adjustment of the mA/kV was utilized to reduce the radiation dose to as low as reasonably achievable. COMPARISON: Cervical spine CT on 08/29/2024 reported separately. CLINICAL HISTORY: 68 year Greer Greer, back trauma. FINDINGS: BONES AND ALIGNMENT: Thoracic segmentation appears normal. Maintained vertebral height. No thoracic vertebral fracture identified and  visible posterior ribs appear intact. DEGENERATIVE CHANGES: Generally mild for age thoracic spine degeneration. However, lower thoracic disc bulging and endplate spurring including at T9-T10. T10-T11 disc bulging and endplate spurring with superimposed moderate lower thoracic posterior element degenerative hypertrophy likely results in mild multifactorial spinal stenosis (series 6, image 148). SOFT TISSUES: Mild respiratory motion artifact. Visible major airways and lungs are essentially clear, with mild symmetric dependent atelectasis. Negative visible noncontrast upper abdominal fissura aside from calcified aortic atherosclerosis. IMPRESSION: 1. No acute traumatic injury identified in the thoracic spine. 2. 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 Electronically signed by: Helayne Hurst MD 08/29/2024 04:51 AM EDT RP Workstation: [id number]   CT HEAD WO CONTRAST Result Date: 08/29/2024 EXAM: CT HEAD AND CERVICAL SPINE 08/29/2024 04:34:07 AM TECHNIQUE: CT of the head and cervical spine was performed without the administration of intravenous contrast. Multiplanar reformatted images are provided for review. Automated exposure control, iterative reconstruction, and/or weight-based adjustment of the mA/kV was utilized to reduce the radiation dose to as low as reasonably achievable. COMPARISON: Comparison made to June 16, 2024. CLINICAL HISTORY: Head trauma, moderate-severe. Patient arrived via  [facility] after a fall into a construction site approximately 4 feet. Patient complaining of back pain. Patient is BKA bilateral, patient was in wheelchair and fell in hole. No LOC. Patient states no pain besides upper back and shoulders. Patient VSS. Best possible images due to patient being unwilling to cooperate. While reviewing images, it was noticed what appeared to be a knife on the patient's right side. Patient was sleeping so it was removed from the patient's coat pocket and bagged. After  taking patient back, it was handed off to the nurses. FINDINGS: CT HEAD BRAIN AND VENTRICLES: Parenchymal volume loss is commensurate with the patient's age. Periventricular white matter changes are present, likely reflecting the sequela of small vessel ischemia. 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 No acute intracranial hemorrhage. No mass effect or midline shift. No abnormal extra-axial fluid collection. No evidence of acute infarct. No hydrocephalus. ORBITS: No acute abnormality. SINUSES AND MASTOIDS: Mastoid air cells and middle ear cavities are clear. No acute abnormality. SOFT TISSUES AND SKULL: Stable scarring within the suboccipital scalp. Disconjugate gaze again noted. No acute skull fracture. No acute soft tissue abnormality. Chronic left medial abdominal wall fracture again noted. CT CERVICAL SPINE BONES AND ALIGNMENT: No acute fracture or traumatic malalignment. DEGENERATIVE CHANGES: Mild degenerative disc disease at C3-C6, most severe at C3-4. 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 Superimposed mild broad-based posterior disc bulge at C3-4 results in moderate central canal stenosis with an AP diameter of the spinal canal of 6 mm and flattening of the thecal sac. Similarly, though milder changes noted at C2-3 and C4-5. No high-grade neural foraminal narrowing. SOFT TISSUES: No prevertebral soft tissue swelling. IMPRESSION: 1. No acute intracranial abnormality. 2. No acute fracture or traumatic malalignment of the cervical spine. 3. Mild degenerative disc disease at C3C6, most severe at C34, with moderate central canal stenosis at C34 (AP canal diameter approximately 6 mm). Electronically signed by: Dorethia Molt MD 08/29/2024 04:41 AM EDT RP Workstation: [id number]   CT Cervical Spine Wo Contrast Result Date: 08/29/2024 EXAM: CT HEAD AND CERVICAL SPINE 08/29/2024 04:34:07 AM TECHNIQUE: CT of the head and cervical spine was performed without the administration of  intravenous contrast. Multiplanar reformatted images are provided for review. Automated exposure control, iterative reconstruction, and/or weight-based adjustment of the mA/kV was utilized to reduce the radiation dose to as low as reasonably achievable. COMPARISON: Comparison made to June 16, 2024. CLINICAL HISTORY: Head trauma, moderate-severe. Patient arrived via [facility] after a fall into a construction site approximately 4 feet. Patient complaining of back pain. Patient is BKA bilateral, patient was in wheelchair and fell in hole. No LOC. Patient states no pain besides upper back and shoulders. Patient VSS. Best possible images due to patient being unwilling to cooperate. While reviewing images, it was noticed what appeared to be a knife on the patient's right side. Patient was sleeping so it was removed from the patient's coat pocket and bagged. After taking patient back, it was handed off to the nurses. FINDINGS: CT HEAD BRAIN AND VENTRICLES: Parenchymal volume loss is commensurate with the patient's age. Periventricular white matter changes are present, likely reflecting the sequela of small vessel ischemia. 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 No acute intracranial hemorrhage. No mass effect or midline shift. No abnormal extra-axial fluid collection. No evidence of acute infarct. No hydrocephalus. ORBITS: No acute abnormality. SINUSES AND MASTOIDS: Mastoid air cells and middle ear cavities are clear. No acute abnormality. SOFT TISSUES AND SKULL: Stable scarring within  the suboccipital scalp. Disconjugate gaze again noted. No acute skull fracture. No acute soft tissue abnormality. Chronic left medial abdominal wall fracture again noted. CT CERVICAL SPINE BONES AND ALIGNMENT: No acute fracture or traumatic malalignment. DEGENERATIVE CHANGES: Mild degenerative disc disease at C3-C6, most severe at C3-4. 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 Superimposed mild broad-based  posterior disc bulge at C3-4 results in moderate central canal stenosis with an AP diameter of the spinal canal of 6 mm and flattening of the thecal sac. Similarly, though milder changes noted at C2-3 and C4-5. No high-grade neural foraminal narrowing. SOFT TISSUES: No prevertebral soft tissue swelling. IMPRESSION: 1. No acute intracranial abnormality. 2. No acute fracture or traumatic malalignment of the cervical spine. 3. Mild degenerative disc disease at C3C6, most severe at C34, with moderate central canal stenosis at C34 (AP canal diameter approximately 6 mm). Electronically signed by: Dorethia Molt MD 08/29/2024 04:41 AM EDT RP Workstation: [id number]   DG Pelvis Portable Result Date: 08/29/2024 EXAM: 1 or 2 VIEW(S) XRAY OF THE PELVIS 08/29/2024 01:22:00 AM COMPARISON: 06/16/2024 CLINICAL HISTORY: PT arrived via [facility] after a fall into a construction site aprox 38foot. PT complaining of back pain. PT is BKA bilateral, PT was in wheelchair and fell in hole. No LOC. PT states no pain besides upper back and shoulders. FINDINGS: BONES AND JOINTS: No acute fracture. No focal osseous lesion. No joint dislocation. Mild degenerative changes of the bilateral hips. SOFT TISSUES: The soft tissues are unremarkable. IMPRESSION: 1. No acute osseous abnormality. Electronically signed by: Pinkie Pebbles MD 08/29/2024 01:31 AM EDT RP Workstation: [id number]   DG Shoulder Left Port Result Date: 08/29/2024 EXAM: 1 VIEW XRAY OF THE LEFT SHOULDER 08/29/2024 01:22:00 AM COMPARISON: None available. CLINICAL HISTORY: Fall into holiday representative site FINDINGS: BONES AND JOINTS: Glenohumeral joint is normally aligned. No acute fracture or dislocation. Moderate degenerative changes of the acromioclavicular joint. SOFT TISSUES: No abnormal calcifications. Visualized lung is unremarkable. IMPRESSION: 1. No acute fracture or dislocation. Electronically signed by: Pinkie Pebbles MD 08/29/2024 01:30 AM EDT RP Workstation: [id number]    DG Chest Port 1 View Result Date: 08/29/2024 EXAM: 1 VIEW(S) XRAY OF THE CHEST 08/29/2024 01:22:00 AM COMPARISON: 06/16/2024 CLINICAL HISTORY: PT arrived via [facility] after a fall into a construction site aprox 41foot. PT complaining of back pain. PT is BKA bilateral, PT was in wheelchair and fell in hole. No LOC. PT states no pain besides upper back and shoulders. FINDINGS: LUNGS AND PLEURA: No focal pulmonary opacity. No pulmonary edema. No pleural effusion. No pneumothorax. HEART AND MEDIASTINUM: No acute abnormality of the cardiac and mediastinal silhouettes. BONES AND SOFT TISSUES: No acute osseous abnormality. IMPRESSION: 1. No acute cardiopulmonary process. Electronically signed by: Pinkie Pebbles MD 08/29/2024 01:29 AM EDT RP Workstation: [id number]     Procedures   Medications Ordered in the ED - No data to display                                  Medical Decision Making  Patient is a Scott Greer who presents with body pain.  He cannot tell me exactly where he is hurting but just says he is hurting all over.  He had a recent fall out of his wheelchair.  He had extensive imaging earlier last night/this morning  at his ED visit.  He has not had any falls since then.  He is overall well-appearing.  I offered him Tylenol  but he  declined.  He was discharged in good condition.  Return precautions were given.     Final diagnoses:  Myalgia    ED Discharge Orders     None          Lenor Hollering, MD 08/30/24 [id number]

## 2024-08-31 NOTE — ED Notes (Signed)
 Pt defecated on himself prior to discharge.

## 2024-09-23 ENCOUNTER — Encounter: Payer: Self-pay | Admitting: Nurse Practitioner

## 2024-09-24 ENCOUNTER — Emergency Department (HOSPITAL_COMMUNITY)
Admission: EM | Admit: 2024-09-24 | Discharge: 2024-09-25 | Disposition: A | Discharge status: Home / Self Care | Attending: Emergency Medicine | Admitting: Emergency Medicine

## 2024-09-24 ENCOUNTER — Other Ambulatory Visit: Payer: Self-pay

## 2024-09-24 ENCOUNTER — Emergency Department (HOSPITAL_COMMUNITY)

## 2024-09-24 ENCOUNTER — Encounter (HOSPITAL_COMMUNITY): Payer: Self-pay

## 2024-09-24 DIAGNOSIS — Z79899 Other long term (current) drug therapy: Secondary | ICD-10-CM | POA: Insufficient documentation

## 2024-09-24 DIAGNOSIS — M79605 Pain in left leg: Secondary | ICD-10-CM | POA: Diagnosis not present

## 2024-09-24 DIAGNOSIS — Z5900 Homelessness unspecified: Secondary | ICD-10-CM | POA: Diagnosis not present

## 2024-09-24 DIAGNOSIS — M79604 Pain in right leg: Secondary | ICD-10-CM | POA: Diagnosis not present

## 2024-09-24 DIAGNOSIS — W19XXXA Unspecified fall, initial encounter: Secondary | ICD-10-CM | POA: Diagnosis not present

## 2024-09-24 DIAGNOSIS — G8929 Other chronic pain: Secondary | ICD-10-CM | POA: Diagnosis present

## 2024-09-24 LAB — CBC
HCT: 23.4 % — ABNORMAL LOW (ref 39.0–52.0)
Hemoglobin: 7.3 g/dL — ABNORMAL LOW (ref 13.0–17.0)
MCH: 26.3 pg (ref 26.0–34.0)
MCHC: 31.2 g/dL (ref 30.0–36.0)
MCV: 84.2 fL (ref 80.0–100.0)
Platelets: 319 K/uL (ref 150–400)
RBC: 2.78 MIL/uL — ABNORMAL LOW (ref 4.22–5.81)
RDW: 16.6 % — ABNORMAL HIGH (ref 11.5–15.5)
WBC: 6.7 K/uL (ref 4.0–10.5)
nRBC: 0 % (ref 0.0–0.2)

## 2024-09-24 LAB — BASIC METABOLIC PANEL WITH GFR
Anion gap: 10 (ref 5–15)
BUN: 30 mg/dL — ABNORMAL HIGH (ref 8–23)
CO2: 22 mmol/L (ref 22–32)
Calcium: 8.7 mg/dL — ABNORMAL LOW (ref 8.9–10.3)
Chloride: 104 mmol/L (ref 98–111)
Creatinine, Ser: 1.92 mg/dL — ABNORMAL HIGH (ref 0.61–1.24)
GFR, Estimated: 38 mL/min — ABNORMAL LOW (ref >60)
Glucose, Bld: 189 mg/dL — ABNORMAL HIGH (ref 70–99)
Potassium: 3.9 mmol/L (ref 3.5–5.1)
Sodium: 136 mmol/L (ref 135–145)

## 2024-09-24 MED ORDER — HYDROCODONE-ACETAMINOPHEN 5-325 MG PO TABS
1.0000 | ORAL_TABLET | Freq: Once | ORAL | Status: AC
  Administered 2024-09-24: 1 via ORAL
  Filled 2024-09-24: qty 1

## 2024-09-24 MED ORDER — HYDRALAZINE HCL 25 MG PO TABS
25.0000 mg | ORAL_TABLET | Freq: Three times a day (TID) | ORAL | Status: DC
  Administered 2024-09-24 – 2024-09-25 (×5): 25 mg via ORAL
  Filled 2024-09-24 (×5): qty 1

## 2024-09-24 MED ORDER — AMLODIPINE BESYLATE 5 MG PO TABS
10.0000 mg | ORAL_TABLET | Freq: Every day | ORAL | Status: DC
  Administered 2024-09-24 – 2024-09-25 (×2): 10 mg via ORAL
  Filled 2024-09-24 (×2): qty 2

## 2024-09-24 MED ORDER — LIDOCAINE 5 % EX PTCH: 1.0000 | MEDICATED_PATCH | CUTANEOUS | Status: DC

## 2024-09-24 MED ORDER — OXYCODONE HCL 5 MG PO TABS
5.0000 mg | ORAL_TABLET | Freq: Four times a day (QID) | ORAL | Status: AC | PRN
  Administered 2024-09-24 – 2024-09-25 (×3): 5 mg via ORAL
  Filled 2024-09-24 (×3): qty 1

## 2024-09-24 MED ORDER — FUROSEMIDE 20 MG PO TABS
20.0000 mg | ORAL_TABLET | Freq: Every day | ORAL | Status: DC | PRN
Start: 2024-09-24 — End: 2024-09-25
  Administered 2024-09-24: 20 mg via ORAL
  Filled 2024-09-24: qty 1

## 2024-09-24 MED ORDER — ACETAMINOPHEN 500 MG PO TABS
1000.0000 mg | ORAL_TABLET | Freq: Four times a day (QID) | ORAL | Status: DC | PRN
  Administered 2024-09-25: 1000 mg via ORAL
  Filled 2024-09-24: qty 2

## 2024-09-24 NOTE — Discharge Instructions (Signed)
 Please use your previously provided resources for assistance.  IRC is open.  Return here for concerning changes in your condition.

## 2024-09-24 NOTE — Progress Notes (Signed)
 RN expressed concerns regarding pts inability to care for himself and inquired about SNF. Per chart review, pt has traditional Medicare w/o [facility] Waiver and is unable to be placed into SNF from ED. Pt would require 3 midnight inpt stay to potentially be placed into SNF.

## 2024-09-24 NOTE — ED Provider Triage Note (Signed)
 Emergency Medicine Provider Triage Evaluation Note  Scott Greer , a 68 y.o. male  was evaluated in triage.  Pt complains of pain all over.  Reports he is homeless and has fallen out of his washer recently.  He is unsure if he hit his head during his falls.  Denies any blood thinners.  Reports to the ED complaining of pain all over, right hip pain.  Smells heavily of urine.  Review of Systems  Positive:  Negative:   Physical Exam  BP (!) 129/98   Pulse 91   Temp 97.6 F (36.4 C)   Resp 16   Ht 5' 9 (1.753 m)   Wt 99.8 kg   SpO2 100%   BMI 32.49 kg/m  Gen:   Awake, no distress   Resp:  Normal effort  MSK:   Moves extremities without difficulty  Other:    Medical Decision Making  Medically screening exam initiated at 2:03 AM.  Appropriate orders placed.  Scott Greer was informed that the remainder of the evaluation will be completed by another provider, this initial triage assessment does not replace that evaluation, and the importance of remaining in the ED until their evaluation is complete.     Scott Lonni FALCON, PA-C 09/24/24 [phone]

## 2024-09-24 NOTE — Progress Notes (Signed)
 Pt does not qualify for SNF or additional wheelchair. Pt aware of available shelter/housing resources.
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
## 2024-09-24 NOTE — ED Notes (Signed)
 CSW at beside speaking with patient.

## 2024-09-24 NOTE — ED Notes (Signed)
 Patient keeps asking for cookies, ice cream and cinnamon buns. RN educated patient multiple times that he cannot have these types of foods since he is diabetic.

## 2024-09-24 NOTE — Care Management (Addendum)
 Transition of Care [facility]) - Emergency Department Mini Assessment   Patient Details  Name: Scott Greer MRN: [id number] Date of Birth: February 15, 1956  [facility]) CM/SW Contact:    Corean JAYSON Canary, RN Phone Number: 09/24/2024, 8:27 AM   Clinical Narrative: Patient is well known to ED. He is wheelchair bound. He has Medicare so is ineligible for [facility] medication assistance. He is homeless and has been declined in the recent past for SNF placement.   Has been homeless for many years and has had numerous resources provided. To him.  .  [facility] reached out regarding the patient he states his wheelchair is broken and getting fixed  He is a bilateral amputee, Forwarded information to On call [facility] leadership and Director for next steps 1130 Discussed with Director of IP CM Z. Brooks. With CSW.  We have already purchased a WC for him, we will ask him about who is fixing wheelchair, where it is, contact information. And financial ability to purchase new one from patient.  ED Mini Assessment: What brought you to the Emergency Department? : [place] last week out of wheelchair  Barriers to Discharge: Homeless with medical needs        Interventions which prevented an admission or readmission: Homeless Screening, Medication Review    Patient Contact and Communications        ,                 Admission diagnosis:  pain Patient Active Problem List   Diagnosis Date Noted   Acute renal failure superimposed on stage 3b chronic kidney disease (HCC) 05/16/2024   Leg swelling 05/16/2024   PDR (proliferative diabetic retinopathy) (HCC) 04/10/2023   Generalized weakness 04/09/2023   Dehydration 04/09/2023   Fall at home, initial encounter 04/09/2023   Acute cystitis 04/09/2023   History of anemia due to chronic kidney disease 04/09/2023   Acute renal failure superimposed on stage 3a chronic kidney disease (HCC) 04/08/2023   Encounter for power mobility device  assessment 01/03/2023   Chronic diastolic CHF (congestive heart failure) (HCC) 12/01/2021   S/P BKA (below knee amputation) bilateral (HCC) 07/18/2021   Gangrene of right foot (HCC)    Foot pain, right 04/14/2021   Status post below-knee amputation (HCC) 02/17/2021   Wound dehiscence    Gangrene of toe of left foot (HCC)    Cutaneous abscess of left foot    Left foot infection 02/07/2021   Leukocytosis 02/07/2021   Thrombocytosis 02/07/2021   Hyponatremia 02/07/2021   AKI (acute kidney injury) 02/07/2021   Hyperglycemia due to diabetes mellitus (HCC) 02/07/2021   New onset of congestive heart failure (HCC) 12/26/2020   CKD (chronic kidney disease) stage 3, GFR 30-59 ml/min (HCC) 12/26/2020   Acute systolic CHF (congestive heart failure) (HCC) 12/26/2020   Anemia, chronic disease 06/22/2020   Amputee, great toe, right 06/20/2020   Normocytic anemia 06/20/2020   Erectile dysfunction associated with type 2 diabetes mellitus (HCC) 06/20/2020   Positive for macroalbuminuria 10/24/2019   Amputation of left great toe 10/23/2019   Tobacco dependence 10/23/2019   Diabetic foot infection (HCC)    Noncompliance    Glaucoma    Hyperlipidemia    Essential hypertension 11/06/2003   DM2 (diabetes mellitus, type 2) (HCC) 11/05/1997   PCP:  Vicci Barnie NOVAK, MD Pharmacy:   [name] [address] [place] [address] Phone: [phone] Fax: [phone]  DARR[facility] LONG - [facility] [address] [place] [address] Phone: [phone] Fax: [phone]

## 2024-09-24 NOTE — [place] FL2 (Signed)
   MEDICAID FL2 LEVEL OF CARE FORM     IDENTIFICATION  Patient Name: Scott Greer Birthdate: 09-17-1956 Sex: male Admission Date (Current Location): 09/24/2024  [facility] and [place] Number:  Producer, Television/film/video and Address:  [facility] [place], [address] [place], [place] [address]      Provider Number: [id number]  Attending Physician Name and Address:  Haze Lonni PARAS, *  Relative Name and Phone Number:       Current Level of Care: Other (Comment) (LTC) Recommended Level of Care: Nursing Facility Prior Approval Number:    Date Approved/Denied:   PASRR Number: [id number]  Discharge Plan: Other (Comment) (LTC)    Current Diagnoses: Patient Active Problem List   Diagnosis Date Noted   Acute renal failure superimposed on stage 3b chronic kidney disease (HCC) 05/16/2024   Leg swelling 05/16/2024   PDR (proliferative diabetic retinopathy) (HCC) 04/10/2023   Generalized weakness 04/09/2023   Dehydration 04/09/2023   Fall at home, initial encounter 04/09/2023   Acute cystitis 04/09/2023   History of anemia due to chronic kidney disease 04/09/2023   Acute renal failure superimposed on stage 3a chronic kidney disease (HCC) 04/08/2023   Encounter for power mobility device assessment 01/03/2023   Chronic diastolic CHF (congestive heart failure) (HCC) 12/01/2021   S/P BKA (below knee amputation) bilateral (HCC) 07/18/2021   Gangrene of right foot (HCC)    Foot pain, right 04/14/2021   Status post below-knee amputation (HCC) 02/17/2021   Wound dehiscence    Gangrene of toe of left foot (HCC)    Cutaneous abscess of left foot    Left foot infection 02/07/2021   Leukocytosis 02/07/2021   Thrombocytosis 02/07/2021   Hyponatremia 02/07/2021   AKI (acute kidney injury) 02/07/2021   Hyperglycemia due to diabetes mellitus (HCC) 02/07/2021   New onset of congestive heart failure (HCC) 12/26/2020   CKD (chronic kidney disease) stage 3, GFR  30-59 ml/min (HCC) 12/26/2020   Acute systolic CHF (congestive heart failure) (HCC) 12/26/2020   Anemia, chronic disease 06/22/2020   Amputee, great toe, right 06/20/2020   Normocytic anemia 06/20/2020   Erectile dysfunction associated with type 2 diabetes mellitus (HCC) 06/20/2020   Positive for macroalbuminuria 10/24/2019   Amputation of left great toe 10/23/2019   Tobacco dependence 10/23/2019   Diabetic foot infection (HCC)    Noncompliance    Glaucoma    Hyperlipidemia    Essential hypertension 11/06/2003   DM2 (diabetes mellitus, type 2) (HCC) 11/05/1997    Orientation RESPIRATION BLADDER Height & Weight     Self, Time, Situation, Place  Normal Continent Weight: 220 lb 0.3 oz (99.8 kg) Height:  5' 9 (175.3 cm)  BEHAVIORAL SYMPTOMS/MOOD NEUROLOGICAL BOWEL NUTRITION STATUS      Continent Diet (see dc summary)  AMBULATORY STATUS COMMUNICATION OF NEEDS Skin   Limited Assist Verbally Normal                       Personal Care Assistance Level of Assistance  Bathing, Feeding, Dressing Bathing Assistance: Limited assistance Feeding assistance: Independent Dressing Assistance: Limited assistance     Functional Limitations Info  Speech, Hearing, Sight Sight Info: Adequate Hearing Info: Adequate Speech Info: Adequate    SPECIAL CARE FACTORS FREQUENCY                       Contractures Contractures Info: Not present    Additional Factors Info  Code Status, Allergies Code  Status Info: Full Allergies Info: Bee Venom  Metformin            Current Medications (09/24/2024):  This is the current hospital active medication list Current Facility-Administered Medications  Medication Dose Route Frequency Provider Last Rate Last Admin   amLODipine  (NORVASC ) tablet 10 mg  10 mg Oral Daily Pollina, Lonni PARAS, MD   10 mg at 09/24/24 0534   furosemide  (LASIX ) tablet 20 mg  20 mg Oral Daily PRN Pollina, Christopher J, MD   20 mg at 09/24/24 0534   hydrALAZINE   (APRESOLINE ) tablet 25 mg  25 mg Oral TID Pollina, Christopher J, MD   25 mg at 09/24/24 9465   Current Outpatient Medications  Medication Sig Dispense Refill   acetaminophen  (TYLENOL ) 500 MG tablet Take 2 tablets (1,000 mg total) by mouth every 6 (six) hours as needed for mild pain (pain score 1-3) or moderate pain (pain score 4-6). 30 tablet 0   amLODipine  (NORVASC ) 10 MG tablet Take 1 tablet (10 mg total) by mouth daily. 30 tablet 0   furosemide  (LASIX ) 20 MG tablet Take 1 tablet (20 mg total) by mouth daily as needed for fluid. 10 tablet 0   hydrALAZINE  (APRESOLINE ) 25 MG tablet Take 1 tablet (25 mg total) by mouth 3 (three) times daily. 90 tablet 0     Discharge Medications: Please see discharge summary for a list of discharge medications.  Relevant Imaging Results:  Relevant Lab Results:   Additional Information SSN [id number]  Sheri ONEIDA Sharps, LCSW

## 2024-09-24 NOTE — Progress Notes (Signed)
 After extensive discussion with pt, pt is opting for LTC. Per ICM leadership, CSW instructed to assist with LTC placement. FL2 completed and SNF referral faxed for LTC. Pending accepting facility.

## 2024-09-24 NOTE — ED Notes (Signed)
 Patient soiled coming from lobby and CT.  Patient cleaned, linens changed and new brief applied.  Wanda and Moody NT did bed bath. New gown and warm blankets

## 2024-09-24 NOTE — ED Notes (Signed)
Pt given urinal to provide UA

## 2024-09-24 NOTE — ED Provider Notes (Signed)
 Per social work patient appropriate for discharge.   Garrick Charleston, MD 09/24/24 [phone]

## 2024-09-24 NOTE — ED Notes (Signed)
 Provided with sandiwch bag, diet coke and ice cream per patient request.
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
## 2024-09-25 NOTE — TOC CM/SW Note (Signed)
 Per CM leadership, a Letter of Guarantee will be provided for the purchase of a new w/c for patient.   Message sent to [employer] rep at [phone] to determine what delivery timeframe they can provide. Per rep, they can have the w/c delivered within one hour. CM requested they proceed with processing request. Patient information provided. Letter of Guarantee faxed to [employer] at [phone].   RN updated with delivery timeframe and patient's need for bus passes if he has not yet received them.   Merilee Batty, MSN, RN Case Management [phone]

## 2024-09-25 NOTE — ED Provider Notes (Addendum)
 Emergency Medicine Observation Re-evaluation Note  Scott Greer is a 68 y.o. male, seen on rounds today.  Pt initially presented to the ED for complaints of Pain Currently, the patient is sleeping and resting calmly in bed.  Physical Exam  BP (!) 188/97 (BP Location: Right Arm)   Pulse 88   Temp 97.6 F (36.4 C) (Oral)   Resp 20   Ht 5' 9 (1.753 m)   Wt 99.8 kg   SpO2 100%   BMI 32.49 kg/m  Physical Exam General: No agitation at this time and resting Cardiac: Not tachycardic on last vital signs Lungs: Symmetric rise and fall chest wall resting Psych: No agitation at this time  ED Course / MDM  EKG:   I have reviewed the labs performed to date as well as medications administered while in observation.  Recent changes in the last 24 hours include none reported by overnight nursing.  Plan  Current plan is for awaiting placement.    Najla Aughenbaugh, Lonni PARAS, MD 09/25/24 0815  1:40 PM Patient reportedly now ready for discharge home.  Will flip him to discharge per their recommendations.   Trentan Trippe, Lonni PARAS, MD 09/25/24 1340

## 2024-09-25 NOTE — Progress Notes (Addendum)
 CSW attempted to assist w/ LTC placement. Unable to find accepting facility. Expained to pt that CSW exhausted all option that hospital can support. ICM leadership notified. Hospital will assist w/ providing pt a wheel chair again, hospital has provided pt w/ several wheelchairs this year alone. CSW provided Housing/shelter and food resources to pt.

## 2024-09-29 ENCOUNTER — Encounter (HOSPITAL_COMMUNITY): Payer: Self-pay | Admitting: Emergency Medicine

## 2024-09-29 ENCOUNTER — Emergency Department (HOSPITAL_COMMUNITY)
Admission: EM | Admit: 2024-09-29 | Discharge: 2024-09-30 | Disposition: A | Discharge status: Home / Self Care | Attending: Emergency Medicine | Admitting: Emergency Medicine

## 2024-09-29 ENCOUNTER — Other Ambulatory Visit: Payer: Self-pay

## 2024-09-29 DIAGNOSIS — N189 Chronic kidney disease, unspecified: Secondary | ICD-10-CM | POA: Insufficient documentation

## 2024-09-29 DIAGNOSIS — F17290 Nicotine dependence, other tobacco product, uncomplicated: Secondary | ICD-10-CM | POA: Diagnosis not present

## 2024-09-29 DIAGNOSIS — Z89511 Acquired absence of right leg below knee: Secondary | ICD-10-CM | POA: Insufficient documentation

## 2024-09-29 DIAGNOSIS — M25561 Pain in right knee: Secondary | ICD-10-CM | POA: Insufficient documentation

## 2024-09-29 DIAGNOSIS — E1122 Type 2 diabetes mellitus with diabetic chronic kidney disease: Secondary | ICD-10-CM | POA: Insufficient documentation

## 2024-09-29 DIAGNOSIS — G8929 Other chronic pain: Secondary | ICD-10-CM | POA: Insufficient documentation

## 2024-09-29 DIAGNOSIS — I130 Hypertensive heart and chronic kidney disease with heart failure and stage 1 through stage 4 chronic kidney disease, or unspecified chronic kidney disease: Secondary | ICD-10-CM | POA: Insufficient documentation

## 2024-09-29 DIAGNOSIS — Z89512 Acquired absence of left leg below knee: Secondary | ICD-10-CM | POA: Insufficient documentation

## 2024-09-29 DIAGNOSIS — I5030 Unspecified diastolic (congestive) heart failure: Secondary | ICD-10-CM | POA: Diagnosis not present

## 2024-09-29 DIAGNOSIS — Z5900 Homelessness unspecified: Secondary | ICD-10-CM | POA: Insufficient documentation

## 2024-09-29 NOTE — ED Triage Notes (Signed)
 [place] [facility] [place] [facility] with rt knee pain and other generalized pain after fall out of wheelchair.   BP 170/90 HR 102 Resp 18 Spo2 96

## 2024-09-30 ENCOUNTER — Emergency Department (HOSPITAL_COMMUNITY): Payer: No Typology Code available for payment source

## 2024-09-30 ENCOUNTER — Other Ambulatory Visit (HOSPITAL_COMMUNITY): Payer: Self-pay

## 2024-09-30 ENCOUNTER — Other Ambulatory Visit: Payer: Self-pay

## 2024-09-30 DIAGNOSIS — I953 Hypotension of hemodialysis: Secondary | ICD-10-CM | POA: Diagnosis not present

## 2024-09-30 DIAGNOSIS — Z6826 Body mass index (BMI) 26.0-26.9, adult: Secondary | ICD-10-CM

## 2024-09-30 DIAGNOSIS — I130 Hypertensive heart and chronic kidney disease with heart failure and stage 1 through stage 4 chronic kidney disease, or unspecified chronic kidney disease: Secondary | ICD-10-CM | POA: Diagnosis present

## 2024-09-30 DIAGNOSIS — K9423 Gastrostomy malfunction: Secondary | ICD-10-CM | POA: Diagnosis not present

## 2024-09-30 DIAGNOSIS — E870 Hyperosmolality and hypernatremia: Secondary | ICD-10-CM | POA: Diagnosis not present

## 2024-09-30 DIAGNOSIS — Z23 Encounter for immunization: Secondary | ICD-10-CM

## 2024-09-30 DIAGNOSIS — S2243XA Multiple fractures of ribs, bilateral, initial encounter for closed fracture: Secondary | ICD-10-CM | POA: Diagnosis present

## 2024-09-30 DIAGNOSIS — S32810A Multiple fractures of pelvis with stable disruption of pelvic ring, initial encounter for closed fracture: Secondary | ICD-10-CM | POA: Diagnosis present

## 2024-09-30 DIAGNOSIS — E1122 Type 2 diabetes mellitus with diabetic chronic kidney disease: Secondary | ICD-10-CM | POA: Diagnosis present

## 2024-09-30 DIAGNOSIS — N179 Acute kidney failure, unspecified: Secondary | ICD-10-CM

## 2024-09-30 DIAGNOSIS — S72492B Other fracture of lower end of left femur, initial encounter for open fracture type I or II: Secondary | ICD-10-CM

## 2024-09-30 DIAGNOSIS — F1410 Cocaine abuse, uncomplicated: Secondary | ICD-10-CM | POA: Diagnosis present

## 2024-09-30 DIAGNOSIS — Z992 Dependence on renal dialysis: Secondary | ICD-10-CM

## 2024-09-30 DIAGNOSIS — M6282 Rhabdomyolysis: Secondary | ICD-10-CM | POA: Diagnosis present

## 2024-09-30 DIAGNOSIS — I5033 Acute on chronic diastolic (congestive) heart failure: Secondary | ICD-10-CM | POA: Diagnosis not present

## 2024-09-30 DIAGNOSIS — E875 Hyperkalemia: Secondary | ICD-10-CM | POA: Diagnosis not present

## 2024-09-30 DIAGNOSIS — G9341 Metabolic encephalopathy: Secondary | ICD-10-CM | POA: Diagnosis not present

## 2024-09-30 DIAGNOSIS — Z888 Allergy status to other drugs, medicaments and biological substances status: Secondary | ICD-10-CM

## 2024-09-30 DIAGNOSIS — R1310 Dysphagia, unspecified: Secondary | ICD-10-CM | POA: Diagnosis present

## 2024-09-30 DIAGNOSIS — D631 Anemia in chronic kidney disease: Secondary | ICD-10-CM | POA: Diagnosis present

## 2024-09-30 DIAGNOSIS — E1165 Type 2 diabetes mellitus with hyperglycemia: Secondary | ICD-10-CM | POA: Diagnosis not present

## 2024-09-30 DIAGNOSIS — G9389 Other specified disorders of brain: Secondary | ICD-10-CM | POA: Diagnosis present

## 2024-09-30 DIAGNOSIS — J9601 Acute respiratory failure with hypoxia: Secondary | ICD-10-CM | POA: Diagnosis not present

## 2024-09-30 DIAGNOSIS — Z5900 Homelessness unspecified: Secondary | ICD-10-CM

## 2024-09-30 DIAGNOSIS — S72402B Unspecified fracture of lower end of left femur, initial encounter for open fracture type I or II: Principal | ICD-10-CM | POA: Diagnosis present

## 2024-09-30 DIAGNOSIS — Z993 Dependence on wheelchair: Secondary | ICD-10-CM

## 2024-09-30 DIAGNOSIS — R471 Dysarthria and anarthria: Secondary | ICD-10-CM | POA: Diagnosis not present

## 2024-09-30 DIAGNOSIS — S3210XA Unspecified fracture of sacrum, initial encounter for closed fracture: Secondary | ICD-10-CM | POA: Diagnosis present

## 2024-09-30 DIAGNOSIS — E440 Moderate protein-calorie malnutrition: Secondary | ICD-10-CM | POA: Diagnosis not present

## 2024-09-30 DIAGNOSIS — N1832 Chronic kidney disease, stage 3b: Secondary | ICD-10-CM | POA: Diagnosis present

## 2024-09-30 DIAGNOSIS — S72002A Fracture of unspecified part of neck of left femur, initial encounter for closed fracture: Secondary | ICD-10-CM

## 2024-09-30 DIAGNOSIS — Y92410 Unspecified street and highway as the place of occurrence of the external cause: Secondary | ICD-10-CM

## 2024-09-30 DIAGNOSIS — S2241XA Multiple fractures of ribs, right side, initial encounter for closed fracture: Secondary | ICD-10-CM

## 2024-09-30 DIAGNOSIS — R40241 Glasgow coma scale score 13-15, unspecified time: Secondary | ICD-10-CM | POA: Diagnosis present

## 2024-09-30 DIAGNOSIS — Z89511 Acquired absence of right leg below knee: Secondary | ICD-10-CM

## 2024-09-30 DIAGNOSIS — G931 Anoxic brain damage, not elsewhere classified: Secondary | ICD-10-CM | POA: Diagnosis not present

## 2024-09-30 DIAGNOSIS — J690 Pneumonitis due to inhalation of food and vomit: Secondary | ICD-10-CM | POA: Diagnosis not present

## 2024-09-30 DIAGNOSIS — Z79899 Other long term (current) drug therapy: Secondary | ICD-10-CM

## 2024-09-30 DIAGNOSIS — K5900 Constipation, unspecified: Secondary | ICD-10-CM | POA: Diagnosis not present

## 2024-09-30 DIAGNOSIS — S069XAA Unspecified intracranial injury with loss of consciousness status unknown, initial encounter: Secondary | ICD-10-CM | POA: Diagnosis present

## 2024-09-30 DIAGNOSIS — S27322A Contusion of lung, bilateral, initial encounter: Secondary | ICD-10-CM | POA: Diagnosis present

## 2024-09-30 DIAGNOSIS — L89892 Pressure ulcer of other site, stage 2: Secondary | ICD-10-CM | POA: Clinically undetermined

## 2024-09-30 DIAGNOSIS — R569 Unspecified convulsions: Secondary | ICD-10-CM | POA: Diagnosis not present

## 2024-09-30 DIAGNOSIS — E876 Hypokalemia: Secondary | ICD-10-CM | POA: Diagnosis not present

## 2024-09-30 DIAGNOSIS — Z91199 Patient's noncompliance with other medical treatment and regimen due to unspecified reason: Secondary | ICD-10-CM

## 2024-09-30 DIAGNOSIS — E874 Mixed disorder of acid-base balance: Secondary | ICD-10-CM | POA: Diagnosis not present

## 2024-09-30 DIAGNOSIS — N170 Acute kidney failure with tubular necrosis: Secondary | ICD-10-CM | POA: Diagnosis present

## 2024-09-30 DIAGNOSIS — I6381 Other cerebral infarction due to occlusion or stenosis of small artery: Secondary | ICD-10-CM | POA: Diagnosis not present

## 2024-09-30 DIAGNOSIS — E7220 Disorder of urea cycle metabolism, unspecified: Secondary | ICD-10-CM | POA: Diagnosis not present

## 2024-09-30 DIAGNOSIS — F1210 Cannabis abuse, uncomplicated: Secondary | ICD-10-CM | POA: Diagnosis present

## 2024-09-30 DIAGNOSIS — T1490XA Injury, unspecified, initial encounter: Secondary | ICD-10-CM | POA: Diagnosis present

## 2024-09-30 DIAGNOSIS — Z91030 Bee allergy status: Secondary | ICD-10-CM

## 2024-09-30 DIAGNOSIS — G9340 Encephalopathy, unspecified: Secondary | ICD-10-CM

## 2024-09-30 DIAGNOSIS — S42292A Other displaced fracture of upper end of left humerus, initial encounter for closed fracture: Secondary | ICD-10-CM | POA: Diagnosis present

## 2024-09-30 DIAGNOSIS — Z89512 Acquired absence of left leg below knee: Secondary | ICD-10-CM

## 2024-09-30 DIAGNOSIS — S270XXA Traumatic pneumothorax, initial encounter: Secondary | ICD-10-CM | POA: Diagnosis present

## 2024-09-30 DIAGNOSIS — D62 Acute posthemorrhagic anemia: Secondary | ICD-10-CM | POA: Diagnosis not present

## 2024-09-30 DIAGNOSIS — F17220 Nicotine dependence, chewing tobacco, uncomplicated: Secondary | ICD-10-CM | POA: Diagnosis present

## 2024-09-30 DIAGNOSIS — E785 Hyperlipidemia, unspecified: Secondary | ICD-10-CM | POA: Diagnosis present

## 2024-09-30 DIAGNOSIS — F17290 Nicotine dependence, other tobacco product, uncomplicated: Secondary | ICD-10-CM | POA: Diagnosis present

## 2024-09-30 HISTORY — DX: Complete traumatic amputation at level between unspecified hip and knee, initial encounter: S78.119A

## 2024-09-30 HISTORY — DX: Cerebral infarction, unspecified: I63.9

## 2024-09-30 LAB — CBC
HCT: 22.8 % — ABNORMAL LOW (ref 39.0–52.0)
Hemoglobin: 7.1 g/dL — ABNORMAL LOW (ref 13.0–17.0)
MCH: 26 pg (ref 26.0–34.0)
MCHC: 31.1 g/dL (ref 30.0–36.0)
MCV: 83.5 fL (ref 80.0–100.0)
Platelets: 450 K/uL — ABNORMAL HIGH (ref 150–400)
RBC: 2.73 MIL/uL — ABNORMAL LOW (ref 4.22–5.81)
RDW: 16 % — ABNORMAL HIGH (ref 11.5–15.5)
WBC: 11.4 K/uL — ABNORMAL HIGH (ref 4.0–10.5)
nRBC: 0.2 % (ref 0.0–0.2)

## 2024-09-30 LAB — I-STAT CHEM 8, ED
BUN: 50 mg/dL — ABNORMAL HIGH (ref 8–23)
Calcium, Ion: 1.08 mmol/L — ABNORMAL LOW (ref 1.15–1.40)
Chloride: 105 mmol/L (ref 98–111)
Creatinine, Ser: 3.2 mg/dL — ABNORMAL HIGH (ref 0.61–1.24)
Glucose, Bld: 132 mg/dL — ABNORMAL HIGH (ref 70–99)
HCT: 16 % — ABNORMAL LOW (ref 39.0–52.0)
Hemoglobin: 5.4 g/dL — CL (ref 13.0–17.0)
Potassium: 5.5 mmol/L — ABNORMAL HIGH (ref 3.5–5.1)
Sodium: 135 mmol/L (ref 135–145)
TCO2: 21 mmol/L — ABNORMAL LOW (ref 22–32)

## 2024-09-30 LAB — SAMPLE TO BLOOD BANK

## 2024-09-30 LAB — PROTIME-INR
INR: 1.2 (ref 0.8–1.2)
Prothrombin Time: 16.4 s — ABNORMAL HIGH (ref 11.4–15.2)

## 2024-09-30 LAB — I-STAT CG4 LACTIC ACID, ED: Lactic Acid, Venous: 1.5 mmol/L (ref 0.5–1.9)

## 2024-09-30 MED ORDER — TETANUS-DIPHTH-ACELL PERTUSSIS 5-2-15.5 LF-MCG/0.5 IM SUSP
0.5000 mL | Freq: Once | INTRAMUSCULAR | Status: AC
  Administered 2024-09-30: 0.5 mL via INTRAMUSCULAR

## 2024-09-30 MED ORDER — FENTANYL CITRATE (PF) 50 MCG/ML IJ SOSY
100.0000 ug | PREFILLED_SYRINGE | Freq: Once | INTRAMUSCULAR | Status: AC
  Administered 2024-09-30: 100 ug via INTRAVENOUS
  Filled 2024-09-30: qty 2

## 2024-09-30 MED ORDER — NAPROXEN 250 MG PO TABS
500.0000 mg | ORAL_TABLET | Freq: Once | ORAL | Status: AC
  Administered 2024-09-30: 500 mg via ORAL
  Filled 2024-09-30: qty 2

## 2024-09-30 MED ORDER — ONDANSETRON 4 MG PO TBDP
4.0000 mg | ORAL_TABLET | Freq: Once | ORAL | Status: AC
  Administered 2024-09-30: 4 mg via ORAL
  Filled 2024-09-30: qty 1

## 2024-09-30 MED ORDER — HYDROMORPHONE HCL 1 MG/ML IJ SOLN
2.0000 mg | Freq: Once | INTRAMUSCULAR | Status: AC
  Administered 2024-09-30: 2 mg via INTRAVENOUS
  Filled 2024-09-30: qty 2

## 2024-09-30 MED ORDER — OXYCODONE-ACETAMINOPHEN 5-325 MG PO TABS
2.0000 | ORAL_TABLET | Freq: Once | ORAL | Status: AC
  Administered 2024-09-30: 2 via ORAL
  Filled 2024-09-30: qty 2

## 2024-09-30 MED ORDER — PIPERACILLIN-TAZOBACTAM 3.375 G IVPB 30 MIN
3.3750 g | Freq: Once | INTRAVENOUS | Status: AC
  Administered 2024-09-30: 3.375 g via INTRAVENOUS

## 2024-09-30 MED ORDER — NAPROXEN 500 MG PO TABS
500.0000 mg | ORAL_TABLET | Freq: Two times a day (BID) | ORAL | 0 refills | Status: AC
  Filled 2024-09-30: qty 30, 15d supply, fill #0

## 2024-09-30 MED ADMIN — Iohexol IV Soln 350 MG/ML: 75 mL | INTRAVENOUS | NDC 00407141490
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
## 2024-09-30 NOTE — ED Notes (Signed)
 Transported to CT
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
## 2024-09-30 NOTE — Discharge Instructions (Signed)
You were evaluated in the Emergency Department and after careful evaluation, we did not find any emergent condition requiring admission or further testing in the hospital.  Your exam/testing today is overall reassuring.  Please return to the Emergency Department if you experience any worsening of your condition.   Thank you for allowing us to be a part of your care. 
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
## 2024-10-01 ENCOUNTER — Inpatient Hospital Stay (HOSPITAL_COMMUNITY): Admitting: Registered Nurse

## 2024-10-01 ENCOUNTER — Encounter (HOSPITAL_COMMUNITY): Payer: Self-pay | Admitting: Registered Nurse

## 2024-10-01 ENCOUNTER — Inpatient Hospital Stay (HOSPITAL_COMMUNITY)

## 2024-10-01 DIAGNOSIS — G9389 Other specified disorders of brain: Secondary | ICD-10-CM | POA: Diagnosis present

## 2024-10-01 DIAGNOSIS — Z89611 Acquired absence of right leg above knee: Secondary | ICD-10-CM | POA: Diagnosis not present

## 2024-10-01 DIAGNOSIS — G931 Anoxic brain damage, not elsewhere classified: Secondary | ICD-10-CM | POA: Diagnosis not present

## 2024-10-01 DIAGNOSIS — F1410 Cocaine abuse, uncomplicated: Secondary | ICD-10-CM | POA: Diagnosis present

## 2024-10-01 DIAGNOSIS — L89892 Pressure ulcer of other site, stage 2: Secondary | ICD-10-CM | POA: Diagnosis not present

## 2024-10-01 DIAGNOSIS — S27322A Contusion of lung, bilateral, initial encounter: Secondary | ICD-10-CM | POA: Diagnosis present

## 2024-10-01 DIAGNOSIS — I130 Hypertensive heart and chronic kidney disease with heart failure and stage 1 through stage 4 chronic kidney disease, or unspecified chronic kidney disease: Secondary | ICD-10-CM | POA: Diagnosis present

## 2024-10-01 DIAGNOSIS — I509 Heart failure, unspecified: Secondary | ICD-10-CM | POA: Diagnosis not present

## 2024-10-01 DIAGNOSIS — M6282 Rhabdomyolysis: Secondary | ICD-10-CM | POA: Diagnosis present

## 2024-10-01 DIAGNOSIS — J9601 Acute respiratory failure with hypoxia: Secondary | ICD-10-CM | POA: Diagnosis not present

## 2024-10-01 DIAGNOSIS — J939 Pneumothorax, unspecified: Secondary | ICD-10-CM | POA: Diagnosis not present

## 2024-10-01 DIAGNOSIS — S3210XA Unspecified fracture of sacrum, initial encounter for closed fracture: Secondary | ICD-10-CM | POA: Diagnosis present

## 2024-10-01 DIAGNOSIS — I6381 Other cerebral infarction due to occlusion or stenosis of small artery: Secondary | ICD-10-CM | POA: Diagnosis not present

## 2024-10-01 DIAGNOSIS — N289 Disorder of kidney and ureter, unspecified: Secondary | ICD-10-CM | POA: Diagnosis not present

## 2024-10-01 DIAGNOSIS — G9340 Encephalopathy, unspecified: Secondary | ICD-10-CM | POA: Diagnosis not present

## 2024-10-01 DIAGNOSIS — N19 Unspecified kidney failure: Secondary | ICD-10-CM | POA: Diagnosis not present

## 2024-10-01 DIAGNOSIS — F17290 Nicotine dependence, other tobacco product, uncomplicated: Secondary | ICD-10-CM | POA: Diagnosis not present

## 2024-10-01 DIAGNOSIS — N170 Acute kidney failure with tubular necrosis: Secondary | ICD-10-CM | POA: Diagnosis present

## 2024-10-01 DIAGNOSIS — J690 Pneumonitis due to inhalation of food and vomit: Secondary | ICD-10-CM | POA: Diagnosis not present

## 2024-10-01 DIAGNOSIS — J189 Pneumonia, unspecified organism: Secondary | ICD-10-CM | POA: Diagnosis not present

## 2024-10-01 DIAGNOSIS — S72002A Fracture of unspecified part of neck of left femur, initial encounter for closed fracture: Secondary | ICD-10-CM | POA: Diagnosis not present

## 2024-10-01 DIAGNOSIS — I5033 Acute on chronic diastolic (congestive) heart failure: Secondary | ICD-10-CM | POA: Diagnosis not present

## 2024-10-01 DIAGNOSIS — Z4659 Encounter for fitting and adjustment of other gastrointestinal appliance and device: Secondary | ICD-10-CM | POA: Diagnosis not present

## 2024-10-01 DIAGNOSIS — E1122 Type 2 diabetes mellitus with diabetic chronic kidney disease: Secondary | ICD-10-CM | POA: Diagnosis present

## 2024-10-01 DIAGNOSIS — Z89612 Acquired absence of left leg above knee: Secondary | ICD-10-CM | POA: Diagnosis not present

## 2024-10-01 DIAGNOSIS — T1490XA Injury, unspecified, initial encounter: Secondary | ICD-10-CM | POA: Diagnosis present

## 2024-10-01 DIAGNOSIS — Z5900 Homelessness unspecified: Secondary | ICD-10-CM | POA: Diagnosis not present

## 2024-10-01 DIAGNOSIS — N1832 Chronic kidney disease, stage 3b: Secondary | ICD-10-CM | POA: Diagnosis present

## 2024-10-01 DIAGNOSIS — S32810A Multiple fractures of pelvis with stable disruption of pelvic ring, initial encounter for closed fracture: Secondary | ICD-10-CM | POA: Diagnosis present

## 2024-10-01 DIAGNOSIS — I110 Hypertensive heart disease with heart failure: Secondary | ICD-10-CM | POA: Diagnosis not present

## 2024-10-01 DIAGNOSIS — S270XXA Traumatic pneumothorax, initial encounter: Secondary | ICD-10-CM | POA: Diagnosis present

## 2024-10-01 DIAGNOSIS — D638 Anemia in other chronic diseases classified elsewhere: Secondary | ICD-10-CM | POA: Diagnosis not present

## 2024-10-01 DIAGNOSIS — D649 Anemia, unspecified: Secondary | ICD-10-CM | POA: Diagnosis not present

## 2024-10-01 DIAGNOSIS — Z23 Encounter for immunization: Secondary | ICD-10-CM | POA: Diagnosis present

## 2024-10-01 DIAGNOSIS — S2243XA Multiple fractures of ribs, bilateral, initial encounter for closed fracture: Secondary | ICD-10-CM | POA: Diagnosis present

## 2024-10-01 DIAGNOSIS — Y92410 Unspecified street and highway as the place of occurrence of the external cause: Secondary | ICD-10-CM | POA: Diagnosis not present

## 2024-10-01 DIAGNOSIS — Z992 Dependence on renal dialysis: Secondary | ICD-10-CM | POA: Diagnosis not present

## 2024-10-01 DIAGNOSIS — E875 Hyperkalemia: Secondary | ICD-10-CM | POA: Diagnosis not present

## 2024-10-01 DIAGNOSIS — R569 Unspecified convulsions: Secondary | ICD-10-CM | POA: Diagnosis not present

## 2024-10-01 DIAGNOSIS — Z91199 Patient's noncompliance with other medical treatment and regimen due to unspecified reason: Secondary | ICD-10-CM | POA: Diagnosis not present

## 2024-10-01 DIAGNOSIS — S42292A Other displaced fracture of upper end of left humerus, initial encounter for closed fracture: Secondary | ICD-10-CM | POA: Diagnosis present

## 2024-10-01 DIAGNOSIS — I69391 Dysphagia following cerebral infarction: Secondary | ICD-10-CM | POA: Diagnosis not present

## 2024-10-01 DIAGNOSIS — G9341 Metabolic encephalopathy: Secondary | ICD-10-CM | POA: Diagnosis not present

## 2024-10-01 DIAGNOSIS — S069XAA Unspecified intracranial injury with loss of consciousness status unknown, initial encounter: Secondary | ICD-10-CM | POA: Diagnosis present

## 2024-10-01 DIAGNOSIS — F1210 Cannabis abuse, uncomplicated: Secondary | ICD-10-CM | POA: Diagnosis not present

## 2024-10-01 DIAGNOSIS — N179 Acute kidney failure, unspecified: Secondary | ICD-10-CM | POA: Diagnosis not present

## 2024-10-01 DIAGNOSIS — E119 Type 2 diabetes mellitus without complications: Secondary | ICD-10-CM | POA: Diagnosis not present

## 2024-10-01 DIAGNOSIS — E7220 Disorder of urea cycle metabolism, unspecified: Secondary | ICD-10-CM | POA: Diagnosis not present

## 2024-10-01 DIAGNOSIS — I10 Essential (primary) hypertension: Secondary | ICD-10-CM | POA: Diagnosis not present

## 2024-10-01 DIAGNOSIS — I5032 Chronic diastolic (congestive) heart failure: Secondary | ICD-10-CM | POA: Diagnosis not present

## 2024-10-01 DIAGNOSIS — S72402B Unspecified fracture of lower end of left femur, initial encounter for open fracture type I or II: Secondary | ICD-10-CM | POA: Diagnosis present

## 2024-10-01 LAB — CBC
HCT: 20.1 % — ABNORMAL LOW (ref 39.0–52.0)
HCT: 25.5 % — ABNORMAL LOW (ref 39.0–52.0)
Hemoglobin: 6.5 g/dL — CL (ref 13.0–17.0)
Hemoglobin: 8.4 g/dL — ABNORMAL LOW (ref 13.0–17.0)
MCH: 26.4 pg (ref 26.0–34.0)
MCH: 27 pg (ref 26.0–34.0)
MCHC: 32.3 g/dL (ref 30.0–36.0)
MCHC: 32.9 g/dL (ref 30.0–36.0)
MCV: 81.7 fL (ref 80.0–100.0)
MCV: 82 fL (ref 80.0–100.0)
Platelets: 284 K/uL (ref 150–400)
Platelets: 230 K/uL (ref 150–400)
RBC: 2.46 MIL/uL — ABNORMAL LOW (ref 4.22–5.81)
RBC: 3.11 MIL/uL — ABNORMAL LOW (ref 4.22–5.81)
RDW: 15.8 % — ABNORMAL HIGH (ref 11.5–15.5)
RDW: 15.4 % (ref 11.5–15.5)
WBC: 14.5 K/uL — ABNORMAL HIGH (ref 4.0–10.5)
WBC: 13.3 K/uL — ABNORMAL HIGH (ref 4.0–10.5)
nRBC: 0.2 % (ref 0.0–0.2)
nRBC: 0.3 % — ABNORMAL HIGH (ref 0.0–0.2)

## 2024-10-01 LAB — COMPREHENSIVE METABOLIC PANEL WITH GFR
ALT: 91 U/L — ABNORMAL HIGH (ref 0–44)
AST: 202 U/L — ABNORMAL HIGH (ref 15–41)
Albumin: 3.1 g/dL — ABNORMAL LOW (ref 3.5–5.0)
Alkaline Phosphatase: 81 U/L (ref 38–126)
Anion gap: 11 (ref 5–15)
BUN: 51 mg/dL — ABNORMAL HIGH (ref 8–23)
CO2: 20 mmol/L — ABNORMAL LOW (ref 22–32)
Calcium: 8.4 mg/dL — ABNORMAL LOW (ref 8.9–10.3)
Chloride: 106 mmol/L (ref 98–111)
Creatinine, Ser: 2.98 mg/dL — ABNORMAL HIGH (ref 0.61–1.24)
GFR, Estimated: 22 mL/min — ABNORMAL LOW
Glucose, Bld: 138 mg/dL — ABNORMAL HIGH (ref 70–99)
Potassium: 5.4 mmol/L — ABNORMAL HIGH (ref 3.5–5.1)
Sodium: 137 mmol/L (ref 135–145)
Total Bilirubin: 0.5 mg/dL (ref 0.0–1.2)
Total Protein: 6.6 g/dL (ref 6.5–8.1)

## 2024-10-01 LAB — URINALYSIS, ROUTINE W REFLEX MICROSCOPIC
Bilirubin Urine: NEGATIVE
Glucose, UA: NEGATIVE mg/dL
Ketones, ur: NEGATIVE mg/dL
Leukocytes,Ua: NEGATIVE
Nitrite: NEGATIVE
Protein, ur: 100 mg/dL — AB
Specific Gravity, Urine: 1.027 (ref 1.005–1.030)
pH: 5 (ref 5.0–8.0)

## 2024-10-01 LAB — POCT I-STAT 7, (LYTES, BLD GAS, ICA,H+H)
Acid-base deficit: 8 mmol/L — ABNORMAL HIGH (ref 0.0–2.0)
Bicarbonate: 19.7 mmol/L — ABNORMAL LOW (ref 20.0–28.0)
Calcium, Ion: 1.13 mmol/L — ABNORMAL LOW (ref 1.15–1.40)
HCT: 20 % — ABNORMAL LOW (ref 39.0–52.0)
Hemoglobin: 6.8 g/dL — CL (ref 13.0–17.0)
O2 Saturation: 100 %
Patient temperature: 99.4
Potassium: 6.1 mmol/L — ABNORMAL HIGH (ref 3.5–5.1)
Sodium: 134 mmol/L — ABNORMAL LOW (ref 135–145)
TCO2: 21 mmol/L — ABNORMAL LOW (ref 22–32)
pCO2 arterial: 49.2 mmHg — ABNORMAL HIGH (ref 32–48)
pH, Arterial: 7.212 — ABNORMAL LOW (ref 7.35–7.45)
pO2, Arterial: 350 mmHg — ABNORMAL HIGH (ref 83–108)

## 2024-10-01 LAB — BASIC METABOLIC PANEL WITH GFR
Anion gap: 15 (ref 5–15)
Anion gap: 15 (ref 5–15)
BUN: 66 mg/dL — ABNORMAL HIGH (ref 8–23)
BUN: 70 mg/dL — ABNORMAL HIGH (ref 8–23)
CO2: 16 mmol/L — ABNORMAL LOW (ref 22–32)
CO2: 15 mmol/L — ABNORMAL LOW (ref 22–32)
Calcium: 8 mg/dL — ABNORMAL LOW (ref 8.9–10.3)
Calcium: 8.3 mg/dL — ABNORMAL LOW (ref 8.9–10.3)
Chloride: 103 mmol/L (ref 98–111)
Chloride: 107 mmol/L (ref 98–111)
Creatinine, Ser: 3.45 mg/dL — ABNORMAL HIGH (ref 0.61–1.24)
Creatinine, Ser: 3.83 mg/dL — ABNORMAL HIGH (ref 0.61–1.24)
GFR, Estimated: 19 mL/min — ABNORMAL LOW
GFR, Estimated: 17 mL/min — ABNORMAL LOW
Glucose, Bld: 194 mg/dL — ABNORMAL HIGH (ref 70–99)
Glucose, Bld: 131 mg/dL — ABNORMAL HIGH (ref 70–99)
Potassium: 5.8 mmol/L — ABNORMAL HIGH (ref 3.5–5.1)
Potassium: 5.7 mmol/L — ABNORMAL HIGH (ref 3.5–5.1)
Sodium: 134 mmol/L — ABNORMAL LOW (ref 135–145)
Sodium: 137 mmol/L (ref 135–145)

## 2024-10-01 LAB — PREPARE RBC (CROSSMATCH)

## 2024-10-01 LAB — GLUCOSE, CAPILLARY
Glucose-Capillary: 121 mg/dL — ABNORMAL HIGH (ref 70–99)
Glucose-Capillary: 147 mg/dL — ABNORMAL HIGH (ref 70–99)
Glucose-Capillary: 209 mg/dL — ABNORMAL HIGH (ref 70–99)

## 2024-10-01 LAB — MRSA NEXT GEN BY PCR, NASAL: MRSA by PCR Next Gen: NOT DETECTED

## 2024-10-01 LAB — ABO/RH: ABO/RH(D): A POS

## 2024-10-01 LAB — ETHANOL: Alcohol, Ethyl (B): <15 mg/dL

## 2024-10-01 LAB — HIV ANTIBODY (ROUTINE TESTING W REFLEX): HIV Screen 4th Generation wRfx: NONREACTIVE

## 2024-10-01 MED ORDER — SODIUM CHLORIDE 0.9% IV SOLUTION: Freq: Once | INTRAVENOUS | Status: DC

## 2024-10-01 MED ORDER — FENTANYL CITRATE (PF) 50 MCG/ML IJ SOSY: 25.0000 ug | PREFILLED_SYRINGE | Freq: Once | INTRAMUSCULAR | Status: DC

## 2024-10-01 MED ORDER — DEXTROSE 50 % IV SOLN
1.0000 | Freq: Once | INTRAVENOUS | Status: AC
  Administered 2024-10-01: 50 mL via INTRAVENOUS
  Filled 2024-10-01: qty 50

## 2024-10-01 MED ORDER — SODIUM CHLORIDE 0.9% IV SOLUTION: Freq: Once | INTRAVENOUS | Status: AC

## 2024-10-01 MED ORDER — SODIUM ZIRCONIUM CYCLOSILICATE 10 G PO PACK
10.0000 g | PACK | Freq: Once | ORAL | Status: AC
  Administered 2024-10-01: 10 g
  Filled 2024-10-01: qty 1

## 2024-10-01 MED ORDER — ORAL CARE MOUTH RINSE: 15.0000 mL | OROMUCOSAL | Status: DC | PRN

## 2024-10-01 MED ADMIN — ORAL CARE MOUTH RINSE: 15 mL | OROMUCOSAL | NDC 99999080097

## 2024-10-01 MED ADMIN — Acetaminophen Tab 500 MG: 1000 mg | NDC 50580045711

## 2024-10-01 MED ADMIN — Chlorhexidine Gluconate Pads 2%: 6 | TOPICAL | NDC 53462070523

## 2024-10-01 MED ADMIN — Docusate Sodium Liquid 150 MG/15ML: 100 mg | NDC 00904727966

## 2024-10-01 MED ADMIN — Fentanyl Citrate-NaCl 0.9% IV Soln 2.5 MG/250ML: 25 ug/h | INTRAVENOUS | NDC 99999070038

## 2024-10-01 MED ADMIN — Sodium Zirconium Cyclosilicate For Susp Packet 10 GM: 10 g | NDC 00310111001

## 2024-10-01 MED ADMIN — Methocarbamol Inj 1000 MG/10ML: 500 mg | INTRAVENOUS | NDC 71288071611

## 2024-10-01 MED ADMIN — Acetaminophen Tab 500 MG: 1000 mg | ORAL | NDC 50580045711

## 2024-10-01 MED ADMIN — Insulin Aspart Inj 100 Unit/ML: 10 [IU] | INTRAVENOUS | NDC 73070010011

## 2024-10-01 MED ADMIN — Polyethylene Glycol 3350 Oral Packet 17 GM: 17 g | NDC 00904693186

## 2024-10-01 MED ADMIN — Rocuronium Bromide IV Soln 100 MG/10ML (10 MG/ML): 50 mg | INTRAVENOUS | NDC 00143925110

## 2024-10-01 MED ADMIN — PROPOFOL 200 MG/20ML IV EMUL: 30 mg | INTRAVENOUS | NDC 00069020910

## 2024-10-01 MED ADMIN — CEFEPIME 2 GM IVPB: 2 g | INTRAVENOUS | NDC 00409973501

## 2024-10-01 MED ADMIN — Propofol IV Emul 1000 MG/100ML (10 MG/ML): 10 ug/kg/min | INTRAVENOUS | NDC 00069024801

## 2024-10-01 MED ADMIN — Propofol IV Emul 1000 MG/100ML (10 MG/ML): 30 ug/kg/min | INTRAVENOUS | NDC 00069024801

## 2024-10-01 MED ADMIN — Methocarbamol Tab 500 MG: 500 mg | ORAL | NDC 70010075405

## 2024-10-01 MED ADMIN — Calcium Gluconate-NaCl IV Soln 1 GM/50ML-0.675% (20 MG/ML): 1000 mg | INTRAVENOUS | NDC 44567062001

## 2024-10-01 MED FILL — Acetaminophen Tab 500 MG: 1000.0000 mg | ORAL | Qty: 2 | Status: AC

## 2024-10-01 MED FILL — Acetaminophen Tab 500 MG: 1000.0000 mg | ORAL | Qty: 2 | Status: CN

## 2024-10-01 MED FILL — Oxycodone HCl Tab 5 MG: 5.0000 mg | ORAL | Qty: 1 | Status: AC

## 2024-10-01 MED FILL — Polyethylene Glycol 3350 Oral Packet 17 GM: 17.0000 g | ORAL | Qty: 1 | Status: AC

## 2024-10-01 MED FILL — Docusate Sodium Liquid 150 MG/15ML: 100.0000 mg | ORAL | Qty: 10 | Status: AC

## 2024-10-01 MED FILL — Hydromorphone HCl Inj 1 MG/ML: 1.0000 mg | INTRAMUSCULAR | Qty: 1 | Status: AC

## 2024-10-01 MED FILL — Hydromorphone HCl Inj 1 MG/ML: 1.0000 mg | INTRAMUSCULAR | Qty: 1 | Status: CN

## 2024-10-01 MED FILL — Propofol IV Emul 1000 MG/100ML (10 MG/ML): 0.0000 ug/kg/min | INTRAVENOUS | Qty: 100 | Status: AC

## 2024-10-01 MED FILL — Cefepime HCl For IV Soln 2 GM: 2.0000 g | INTRAVENOUS | Qty: 12.5 | Status: AC

## 2024-10-01 MED FILL — Hydralazine HCl Inj 20 MG/ML: 10.0000 mg | INTRAMUSCULAR | Qty: 1 | Status: AC

## 2024-10-01 MED FILL — Fentanyl Citrate-NaCl 0.9% IV Soln 2.5 MG/250ML: 0.0000 ug/h | INTRAVENOUS | Qty: 250 | Status: AC

## 2024-10-01 MED FILL — Sodium Zirconium Cyclosilicate For Susp Packet 10 GM: 10.0000 g | ORAL | Qty: 1 | Status: AC

## 2024-10-01 MED FILL — Methocarbamol Tab 500 MG: 500.0000 mg | ORAL | Qty: 1 | Status: AC

## 2024-10-01 MED FILL — Insulin Aspart Inj Soln 100 Unit/ML: 10.0000 [IU] | INTRAMUSCULAR | Qty: 10 | Status: AC

## 2024-10-01 MED FILL — Metoprolol Tartrate IV Soln 5 MG/5ML: 5.0000 mg | INTRAVENOUS | Qty: 5 | Status: AC

## 2024-10-01 MED FILL — Propofol IV Emul 1000 MG/100ML (10 MG/ML): INTRAVENOUS | Qty: 100 | Status: AC

## 2024-10-01 MED FILL — Calcium Gluconate-NaCl IV Soln 1 GM/50ML-0.675% (20 MG/ML): 1.0000 g | INTRAVENOUS | Qty: 50 | Status: AC

## 2024-10-01 MED FILL — Methocarbamol Inj 1000 MG/10ML: 500.0000 mg | INTRAMUSCULAR | Qty: 10 | Status: AC
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
## 2024-10-01 NOTE — Progress Notes (Signed)
   10/01/24 0200  Vitals  Temp (!) 97.3 F (36.3 C)  Temp Source Axillary  BP Location Right Arm  BP Method Automatic  Patient Position (if appropriate) Lying  Pulse Rate Source Monitor  Level of Consciousness  Level of Consciousness Alert  Oxygen Therapy  O2 Device Nasal Cannula  O2 Flow Rate (L/min) 2 L/min   New patient arrived from ED via stretcher accompanied by [employer]. Patient is A&Ox3, and is unable to answer questions due to pain. Patient was settled in the room with call light in place, bed at lowest position.

## 2024-10-01 NOTE — Progress Notes (Signed)
 Patient ID: Roston Grunewald, male   DOB: 25-Dec-1955, 68 y.o.   MRN: [id number] Notified by RN patient was less responsive. I came to see him and by my arrival he had vomited and had increased RR with rhonchi suspicious for aspiration. I asked Anesthesia to intubate him. Move to ICU. I called his sister at [phone] but got VM. I called a number for another sister at [phone] and VM was full.  Merge chart now available showing HX CHF and CKD stage 3.  CBC and BMET stat. CXR. Will start ABX for aspiration.  Critical care .  Dann Hummer, MD, MPH, FACS Please use [email or web address] to contact on call provider

## 2024-10-01 NOTE — Anesthesia Procedure Notes (Signed)
 Procedure Name: Intubation Date/Time: 10/01/2024 1:21 PM  Performed by: Marva Lonni PARAS, CRNAPre-anesthesia Checklist: Patient identified, Emergency Drugs available, Suction available and Patient being monitored Patient Re-evaluated:Patient Re-evaluated prior to induction Oxygen Delivery Method: Circle system utilized Preoxygenation: Pre-oxygenation with 100% oxygen Induction Type: IV induction Ventilation: Mask ventilation without difficulty Laryngoscope Size: Glidescope and 4 Grade View: Grade I Tube type: Oral Tube size: 7.0 mm Number of attempts: 1 Airway Equipment and Method: Rigid stylet and Video-laryngoscopy Placement Confirmation: ETT inserted through vocal cords under direct vision, positive ETCO2 and breath sounds checked- equal and bilateral Secured at: 23 cm Tube secured with: Tape Dental Injury: Teeth and Oropharynx as per pre-operative assessment  Comments: Out of OR intubation -- suspected aspiration prior to anesthesia arrival   Glidescope S4 blade used -- Grade I view -- atraumatic intubation

## 2024-10-01 NOTE — Progress Notes (Signed)
 During rounds patient presented off baseline from morning assessment. MD informed of patient changes in B/P temp and HR. MD was also notified of orientation changes and unresponsiveness. Charge nurse called to bedside to be made aware. Rapid nurse called to beside and MD also called to bedside. New orders placed for patient and patient moved to higher level of care. Fredie LITTIE Morones, RN

## 2024-10-01 NOTE — Significant Event (Signed)
 Rapid Response Event Note   Reason for Call :  Obtunded, snoring respirations  Initial Focused Assessment:  Patient is obtunded.  He has snoring respirations.  He is warm and diaphoretic. Lung sounds coarse, diminished bases. Heart tones regular.  BP  134/74  HR 107  RR 25  O2 sat 94% on [place]   Dr Sebastian at bedside  Interventions:  Placed on NRB Anesthesia at bedside for urgent intubation Medications and Intubation per Anesthesia. Labs sent OG placed Fentanyl  and propofol  gtt started.   Transferred to ICU  Plan of Care:     Event Summary:   MD Notified: Dr Sebastian at bedside Call Time: 1303 Arrival Time: 1306 End Time: 1410  Elvin [place], RN

## 2024-10-01 NOTE — ED Notes (Signed)
 Pt taken upstairs with one bag of belongings and [employer] at bedside.
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
## 2024-10-01 NOTE — Progress Notes (Signed)
 RT transported pt on ventilator from 4NP04 to 4N31 without any issues. RN and rapid at bedside.

## 2024-10-01 NOTE — Plan of Care (Signed)

## 2024-10-01 NOTE — Progress Notes (Signed)
 Pharmacy Antibiotic Note  Scott Greer is a 68 y.o. male admitted on 09/30/2024 with pneumonia.  Pharmacy has been consulted for cefepime  dosing. Received x1 dose Zosyn  in ED.   Plan: Cefepime  2g IV Q12h Trend WBC, fever, renal function F/u cultures, clinical progress, levels as indicated De-escalate when able     Temp (24hrs), Avg:97.7 F (36.5 C), Min:97.3 F (36.3 C), Max:98.5 F (36.9 C)  Recent Labs  Lab 09/30/24 2317 09/30/24 2332 10/01/24 1317  WBC 11.4*  --  14.5*  CREATININE 2.98* 3.20*  --   LATICACIDVEN  --  1.5  --     CrCl cannot be calculated (Unknown ideal weight.).    No Known Allergies  Thank you for allowing pharmacy to be a part of this patient's care.  Shelba Collier, PharmD, BCPS Clinical Pharmacist

## 2024-10-01 NOTE — Progress Notes (Signed)
 Trauma Event Note    Called by bedside RN- pt becoming more lethargic-- notified by TMD that pt was being intubated.   On arrival to 4NP 04, pt is intubated per anesthesia, rapid repsonse RN at bedside.  Pt being transferred to 4N 31.  Last imported Vital Signs BP 128/79   Pulse (!) 113   Temp 98.4 F (36.9 C) (Axillary)   Resp 19   SpO2 99%   Trending CBC Recent Labs    09/30/24 2317 09/30/24 2332 10/01/24 1317  WBC 11.4*  --  14.5*  HGB 7.1* 5.4* 6.5*  HCT 22.8* 16.0* 20.1*  PLT 450*  --  284    Trending Coag's Recent Labs    09/30/24 2317  INR 1.2    Trending BMET Recent Labs    09/30/24 2317 09/30/24 2332  NA 137 135  K 5.4* 5.5*  CL 106 105  CO2 20*  --   BUN 51* 50*  CREATININE 2.98* 3.20*  GLUCOSE 138* 132*      Betzaida Cremeens M Cylus [place]  Trauma Response RN  Please call TRN at [phone] for further assistance.

## 2024-10-02 ENCOUNTER — Inpatient Hospital Stay (HOSPITAL_COMMUNITY)

## 2024-10-02 ENCOUNTER — Inpatient Hospital Stay (HOSPITAL_COMMUNITY): Payer: Self-pay | Admitting: Registered Nurse

## 2024-10-02 DIAGNOSIS — J690 Pneumonitis due to inhalation of food and vomit: Secondary | ICD-10-CM | POA: Diagnosis not present

## 2024-10-02 DIAGNOSIS — E875 Hyperkalemia: Secondary | ICD-10-CM

## 2024-10-02 DIAGNOSIS — J939 Pneumothorax, unspecified: Secondary | ICD-10-CM

## 2024-10-02 DIAGNOSIS — S27322A Contusion of lung, bilateral, initial encounter: Secondary | ICD-10-CM

## 2024-10-02 DIAGNOSIS — E119 Type 2 diabetes mellitus without complications: Secondary | ICD-10-CM

## 2024-10-02 DIAGNOSIS — I509 Heart failure, unspecified: Secondary | ICD-10-CM | POA: Diagnosis not present

## 2024-10-02 DIAGNOSIS — I110 Hypertensive heart disease with heart failure: Secondary | ICD-10-CM

## 2024-10-02 DIAGNOSIS — J9601 Acute respiratory failure with hypoxia: Secondary | ICD-10-CM | POA: Diagnosis not present

## 2024-10-02 DIAGNOSIS — E1122 Type 2 diabetes mellitus with diabetic chronic kidney disease: Secondary | ICD-10-CM

## 2024-10-02 DIAGNOSIS — S72002A Fracture of unspecified part of neck of left femur, initial encounter for closed fracture: Secondary | ICD-10-CM | POA: Diagnosis not present

## 2024-10-02 DIAGNOSIS — I5033 Acute on chronic diastolic (congestive) heart failure: Secondary | ICD-10-CM

## 2024-10-02 DIAGNOSIS — N1832 Chronic kidney disease, stage 3b: Secondary | ICD-10-CM | POA: Diagnosis not present

## 2024-10-02 DIAGNOSIS — N179 Acute kidney failure, unspecified: Secondary | ICD-10-CM | POA: Diagnosis not present

## 2024-10-02 DIAGNOSIS — D649 Anemia, unspecified: Secondary | ICD-10-CM | POA: Diagnosis not present

## 2024-10-02 HISTORY — PX: FEMUR IM NAIL: SHX1597

## 2024-10-02 LAB — CBC
HCT: 22.4 % — ABNORMAL LOW (ref 39.0–52.0)
Hemoglobin: 7.8 g/dL — ABNORMAL LOW (ref 13.0–17.0)
MCH: 27.4 pg (ref 26.0–34.0)
MCHC: 34.8 g/dL (ref 30.0–36.0)
MCV: 78.6 fL — ABNORMAL LOW (ref 80.0–100.0)
Platelets: 191 K/uL (ref 150–400)
RBC: 2.85 MIL/uL — ABNORMAL LOW (ref 4.22–5.81)
RDW: 15.6 % — ABNORMAL HIGH (ref 11.5–15.5)
WBC: 9.8 K/uL (ref 4.0–10.5)
nRBC: 0.3 % — ABNORMAL HIGH (ref 0.0–0.2)

## 2024-10-02 LAB — BASIC METABOLIC PANEL WITH GFR
Anion gap: 16 — ABNORMAL HIGH (ref 5–15)
Anion gap: 14 (ref 5–15)
BUN: 80 mg/dL — ABNORMAL HIGH (ref 8–23)
BUN: 82 mg/dL — ABNORMAL HIGH (ref 8–23)
CO2: 17 mmol/L — ABNORMAL LOW (ref 22–32)
CO2: 18 mmol/L — ABNORMAL LOW (ref 22–32)
Calcium: 8.2 mg/dL — ABNORMAL LOW (ref 8.9–10.3)
Calcium: 8 mg/dL — ABNORMAL LOW (ref 8.9–10.3)
Chloride: 102 mmol/L (ref 98–111)
Chloride: 102 mmol/L (ref 98–111)
Creatinine, Ser: 4.81 mg/dL — ABNORMAL HIGH (ref 0.61–1.24)
Creatinine, Ser: 5.21 mg/dL — ABNORMAL HIGH (ref 0.61–1.24)
GFR, Estimated: 13 mL/min — ABNORMAL LOW
GFR, Estimated: 11 mL/min — ABNORMAL LOW
Glucose, Bld: 143 mg/dL — ABNORMAL HIGH (ref 70–99)
Glucose, Bld: 180 mg/dL — ABNORMAL HIGH (ref 70–99)
Potassium: 5.6 mmol/L — ABNORMAL HIGH (ref 3.5–5.1)
Potassium: 6.2 mmol/L — ABNORMAL HIGH (ref 3.5–5.1)
Sodium: 135 mmol/L (ref 135–145)
Sodium: 134 mmol/L — ABNORMAL LOW (ref 135–145)

## 2024-10-02 LAB — RENAL FUNCTION PANEL
Albumin: 2.7 g/dL — ABNORMAL LOW (ref 3.5–5.0)
Anion gap: 17 — ABNORMAL HIGH (ref 5–15)
BUN: 84 mg/dL — ABNORMAL HIGH (ref 8–23)
CO2: 16 mmol/L — ABNORMAL LOW (ref 22–32)
Calcium: 8 mg/dL — ABNORMAL LOW (ref 8.9–10.3)
Chloride: 103 mmol/L (ref 98–111)
Creatinine, Ser: 5.18 mg/dL — ABNORMAL HIGH (ref 0.61–1.24)
GFR, Estimated: 12 mL/min — ABNORMAL LOW
Glucose, Bld: 177 mg/dL — ABNORMAL HIGH (ref 70–99)
Phosphorus: 9 mg/dL — ABNORMAL HIGH (ref 2.5–4.6)
Potassium: 6.1 mmol/L — ABNORMAL HIGH (ref 3.5–5.1)
Sodium: 136 mmol/L (ref 135–145)

## 2024-10-02 LAB — GLUCOSE, CAPILLARY
Glucose-Capillary: 129 mg/dL — ABNORMAL HIGH (ref 70–99)
Glucose-Capillary: 144 mg/dL — ABNORMAL HIGH (ref 70–99)
Glucose-Capillary: 139 mg/dL — ABNORMAL HIGH (ref 70–99)
Glucose-Capillary: 185 mg/dL — ABNORMAL HIGH (ref 70–99)
Glucose-Capillary: 177 mg/dL — ABNORMAL HIGH (ref 70–99)

## 2024-10-02 LAB — HEMOGLOBIN A1C
Hgb A1c MFr Bld: 6.2 % — ABNORMAL HIGH (ref 4.8–5.6)
Mean Plasma Glucose: 131 mg/dL

## 2024-10-02 LAB — TRIGLYCERIDES: Triglycerides: 106 mg/dL

## 2024-10-02 LAB — PREPARE RBC (CROSSMATCH)

## 2024-10-02 LAB — CK: Total CK: 984 U/L — ABNORMAL HIGH (ref 49–397)

## 2024-10-02 MED ORDER — SODIUM CHLORIDE 0.9% IV SOLUTION: Freq: Once | INTRAVENOUS | Status: DC

## 2024-10-02 MED ORDER — ROCURONIUM BROMIDE 10 MG/ML (PF) SYRINGE
PREFILLED_SYRINGE | INTRAVENOUS | Status: DC | PRN
  Administered 2024-10-02: 10 mg via INTRAVENOUS
  Administered 2024-10-02: 50 mg via INTRAVENOUS

## 2024-10-02 MED ORDER — PHENYLEPHRINE HCL-NACL 20-0.9 MG/250ML-% IV SOLN
INTRAVENOUS | Status: DC | PRN
  Administered 2024-10-02: 40 ug/min via INTRAVENOUS

## 2024-10-02 MED ORDER — TRANEXAMIC ACID-NACL 1000-0.7 MG/100ML-% IV SOLN
1000.0000 mg | Freq: Once | INTRAVENOUS | Status: AC
  Administered 2024-10-02: 1000 mg via INTRAVENOUS
  Filled 2024-10-02: qty 100

## 2024-10-02 MED ORDER — FUROSEMIDE 10 MG/ML IJ SOLN
100.0000 mg | Freq: Once | INTRAVENOUS | Status: AC
  Administered 2024-10-02: 100 mg via INTRAVENOUS
  Filled 2024-10-02: qty 10

## 2024-10-02 MED ORDER — PROPOFOL 10 MG/ML IV BOLUS
INTRAVENOUS | Status: AC
  Filled 2024-10-02: qty 20

## 2024-10-02 MED ORDER — SODIUM CHLORIDE 0.9 % IV SOLN
3.0000 g | Freq: Two times a day (BID) | INTRAVENOUS | Status: AC
  Administered 2024-10-03 (×2): 3 g via INTRAVENOUS
  Filled 2024-10-02 (×2): qty 8

## 2024-10-02 MED ORDER — NOREPINEPHRINE 4 MG/250ML-% IV SOLN: 0.0000 ug/min | INTRAVENOUS | Status: DC

## 2024-10-02 MED ORDER — MIDAZOLAM HCL 2 MG/2ML IJ SOLN
INTRAMUSCULAR | Status: AC
  Filled 2024-10-02: qty 2

## 2024-10-02 MED ADMIN — ORAL CARE MOUTH RINSE: 15 mL | OROMUCOSAL | NDC 99999080097

## 2024-10-02 MED ADMIN — Acetaminophen Tab 500 MG: 1000 mg | NDC 50580045711

## 2024-10-02 MED ADMIN — Chlorhexidine Gluconate Pads 2%: 6 | TOPICAL | NDC 53462070523

## 2024-10-02 MED ADMIN — Oxycodone HCl Tab 5 MG: 5 mg | NDC 00406055223

## 2024-10-02 MED ADMIN — Docusate Sodium Liquid 150 MG/15ML: 100 mg | NDC 00904727966

## 2024-10-02 MED ADMIN — Sodium Zirconium Cyclosilicate For Susp Packet 10 GM: 10 g | NDC 00310111001

## 2024-10-02 MED ADMIN — Insulin Aspart Inj Soln 100 Unit/ML: 3 [IU] | SUBCUTANEOUS | NDC 73070010011

## 2024-10-02 MED ADMIN — Vancomycin HCl For Inj 1000 MG: 1000 mg | NDC 16714030910

## 2024-10-02 MED ADMIN — Methocarbamol Tab 500 MG: 500 mg | NDC 70010075405

## 2024-10-02 MED ADMIN — Pantoprazole Sodium For IV Soln 40 MG (Base Equiv): 40 mg | INTRAVENOUS | NDC 00008092351

## 2024-10-02 MED ADMIN — Cefazolin Sodium-Dextrose IV Solution 2 GM/100ML-4%: 2 g | INTRAVENOUS | NDC 00338350841

## 2024-10-02 MED ADMIN — henylephrine-NaCl Pref Syr 0.8 MG/10ML-0.9% (80 MCG/ML): 80 ug | INTRAVENOUS | NDC 65302050510

## 2024-10-02 MED ADMIN — CEFEPIME 2 GM IVPB: 2 g | INTRAVENOUS | NDC 00409973510

## 2024-10-02 MED ADMIN — Dexamethasone Sod Phosphate Preservative Free Inj 10 MG/ML: 8 mg | INTRAVENOUS | NDC 70069002101

## 2024-10-02 MED ADMIN — Ondansetron HCl Inj 4 MG/2ML (2 MG/ML): 4 mg | INTRAVENOUS | NDC 00409475518

## 2024-10-02 MED ADMIN — Propofol IV Emul 1000 MG/100ML (10 MG/ML): 10 ug/kg/min | INTRAVENOUS | NDC 00069024801

## 2024-10-02 MED ADMIN — Sodium Chloride Irrigation Soln 0.9%: 1000 mL | NDC 99999050048

## 2024-10-02 MED ADMIN — Cefazolin Sodium for IV Soln 2 GM and Dextrose 3% (50 ML): 2 g | INTRAVENOUS | NDC 00264310511

## 2024-10-02 MED ADMIN — Fentanyl Citrate Preservative Free (PF) Inj 100 MCG/2ML: 50 ug | INTRAVENOUS

## 2024-10-02 MED ADMIN — Ephedrine Sulf-NaCl Soln Pref Syr 50 MG/10ML-0.9% (5 MG/ML): 5 mg | INTRAVENOUS | NDC 51754425001

## 2024-10-02 MED ADMIN — Tranexamic Acid-Sodium Chloride IV Soln 1000 MG/100ML-0.7%: 1000 mg | INTRAVENOUS | NDC 80830232902

## 2024-10-02 MED FILL — Vancomycin HCl For IV Soln 1 GM (Base Equivalent): INTRAVENOUS | Qty: 20 | Status: AC

## 2024-10-02 MED FILL — Tranexamic Acid-Sodium Chloride IV Soln 1000 MG/100ML-0.7%: INTRAVENOUS | Qty: 100 | Status: AC

## 2024-10-02 MED FILL — Ondansetron HCl Tab 4 MG: 4.0000 mg | ORAL | Qty: 1 | Status: AC

## 2024-10-02 MED FILL — Pantoprazole Sodium For IV Soln 40 MG (Base Equiv): 40.0000 mg | INTRAVENOUS | Qty: 10 | Status: AC

## 2024-10-02 MED FILL — Insulin Aspart Inj Soln 100 Unit/ML: 0.0000 [IU] | INTRAMUSCULAR | Qty: 2 | Status: AC

## 2024-10-02 MED FILL — Insulin Aspart Inj Soln 100 Unit/ML: 0.0000 [IU] | INTRAMUSCULAR | Qty: 3 | Status: AC

## 2024-10-02 MED FILL — Insulin Aspart Inj Soln 100 Unit/ML: 0.0000 [IU] | INTRAMUSCULAR | Qty: 4 | Status: AC

## 2024-10-02 MED FILL — Insulin Aspart Inj Soln 100 Unit/ML: 0.0000 [IU] | INTRAMUSCULAR | Qty: 1 | Status: AC

## 2024-10-02 MED FILL — Insulin Aspart Inj Soln 100 Unit/ML: 0.0000 [IU] | INTRAMUSCULAR | Qty: 8 | Status: AC

## 2024-10-02 MED FILL — Heparin Sodium (Porcine) Inj 1000 Unit/ML: 1000.0000 [IU] | INTRAMUSCULAR | Qty: 6 | Status: AC

## 2024-10-02 MED FILL — Heparin Sodium (Porcine) Inj 1000 Unit/ML: 1000.0000 [IU] | INTRAMUSCULAR | Qty: 3 | Status: AC

## 2024-10-02 MED FILL — Sodium Zirconium Cyclosilicate For Susp Packet 10 GM: 10.0000 g | ORAL | Qty: 1 | Status: AC

## 2024-10-02 MED FILL — Cefazolin Sodium-Dextrose IV Solution 2 GM/100ML-4%: 2.0000 g | INTRAVENOUS | Qty: 100 | Status: AC

## 2024-10-02 MED FILL — Fentanyl Citrate Preservative Free (PF) Inj 100 MCG/2ML: INTRAMUSCULAR | Qty: 2 | Status: CN

## 2024-10-02 MED FILL — Ondansetron HCl Inj 4 MG/2ML (2 MG/ML): 4.0000 mg | INTRAMUSCULAR | Qty: 2 | Status: AC

## 2024-10-02 NOTE — Op Note (Signed)
 10/02/2024  4:13 PM  PATIENT:  Scott Greer    PRE-OPERATIVE DIAGNOSIS:  left femur fracture  POST-OPERATIVE DIAGNOSIS:  Same  PROCEDURE:  LEFT FEMUR INTRAMEDULLARY ROD, RETROGRADE  SURGEON:  Evalene JONETTA Chancy, MD  ASSISTANT: Gerard Large, PA-C, he was present and scrubbed throughout the case, critical for completion in a timely fashion, and for retraction, instrumentation, and closure.   ANESTHESIA:   Gen  PREOPERATIVE INDICATIONS:  Scott Greer is a  68 y.o. male with a diagnosis of left femur fracture who failed conservative measures and elected for surgical management.    The risks benefits and alternatives were discussed with the patient preoperatively including but not limited to the risks of infection, bleeding, nerve injury, cardiopulmonary complications, the need for revision surgery, among others, and the patient was willing to proceed.  OPERATIVE IMPLANTS: Stryker retrograde femoral nail and titanium cannulated hip screws  OPERATIVE FINDINGS: unstable distal femur fracture left femoral neck fracture  BLOOD LOSS: 50  COMPLICATIONS: None  TOURNIQUET TIME: none  OPERATIVE PROCEDURE:  Patient was identified in the preoperative holding area and site was marked by me He was transported to the operating theater and placed on the table in supine position taking care to pad all bony prominences. After a preincinduction time out anesthesia was induced. The left lower extremity was prepped and draped in normal sterile fashion and a pre-incision timeout was performed. He received Ancef  for preoperative antibiotics.   I performed a closed reduction of the fracture.  I then made a medial parapatellar incision and dissected down to the peritenon. I continued that incision to the medial parapatellar approach and was careful to protect the articular cartilage meniscus and cruciates.  Next I inserted the guidepin under fluoroscopic visualization at the tip of [place] line  and centered on the AP. I used the entry reamer to ream an entryway into the canal this point.  Next I passed the ball tip wire across the fracture confirmed in appropriate reduction and sequentially reamed up to the appropriate size for the above nail. The nail ended above the subtrochanteric region.  I selected the nail and inserted it under fluoroscopic guidance.  I was happy with the reduction I then placed a distal interlock screws obtaining a good purchase with all of them.  I then locked the distal screws and using a perfect circles technique placed a anterior to posterior proximal interlock screw.  final fluoroscopic images showed appropriate reduction and alignment of hardware I then thoroughly irrigated all wounds and the skin was closed.  Next I performed a reduction and fixation of her left his left femoral neck I placed 2 cannulated screws under fluoroscopic guidance across this fracture and was happy with the purchase of both.  Next I performed a complex closure of his laceration on his leg of 14 cm  POST OPERATIVE PLAN: Mobilize as tolerated
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
## 2024-10-02 NOTE — TOC CAGE-AID Note (Signed)
 Transition of Care [facility]) - CAGE-AID Screening   Patient Details  Name: Scott Greer MRN: [id number] Date of Birth: 07-02-1956  Transition of Care [facility]) CM/SW Contact:    Dia Donate E Octavian Godek, LCSW Phone Number: 10/02/2024, 9:09 AM   Clinical Narrative: Patient is currently intubated.   CAGE-AID Screening: Substance Abuse Screening unable to be completed due to: : Patient unable to participate

## 2024-10-02 NOTE — Transfer of Care (Signed)
 Immediate Anesthesia Transfer of Care Note  Patient: Scott Greer  Procedure(s) Performed: LEFT FEMUR INTRAMEDULLARY ROD, RETROGRADE (Left: Leg Upper)  Patient Location: PACU  Anesthesia Type:General  Level of Consciousness: sedated and Patient remains intubated per anesthesia plan  Airway & Oxygen Therapy: Patient Spontanous Breathing and Patient remains intubated per anesthesia plan  Post-op Assessment: Report given to RN and Post -op Vital signs reviewed and stable  Post vital signs: Reviewed and stable  Last Vitals:  Vitals Value Taken Time  BP 108/69 10/02/24 14:08  Temp    Pulse 91 10/02/24 14:10  Resp 20 10/02/24 14:10  SpO2 95 % 10/02/24 14:10  Vitals shown include unfiled device data.  Last Pain:  Vitals:   10/02/24 1155  TempSrc: Axillary  PainSc:          Complications: No notable events documented.

## 2024-10-02 NOTE — Anesthesia Preprocedure Evaluation (Addendum)
 Anesthesia Evaluation  Patient identified by MRN, date of birth, ID band Patient unresponsive    Reviewed: Allergy & Precautions, H&P , NPO status , Patient's Chart, lab work & pertinent test results  Airway Mallampati: Intubated       Dental no notable dental hx. (+) Teeth Intact, Dental Advisory Given   Pulmonary neg pulmonary ROS Pt intubated and sedated   Pulmonary exam normal breath sounds clear to auscultation       Cardiovascular hypertension, +CHF   Rhythm:Regular Rate:Normal     Neuro/Psych negative neurological ROS  negative psych ROS   GI/Hepatic negative GI ROS, Neg liver ROS,,,  Endo/Other  diabetes    Renal/GU Renal InsufficiencyRenal disease  negative genitourinary   Musculoskeletal   Abdominal   Peds  Hematology  (+) Blood dyscrasia, anemia   Anesthesia Other Findings   Reproductive/Obstetrics negative OB ROS                              Anesthesia Physical Anesthesia Plan  ASA: 4  Anesthesia Plan: General   Post-op Pain Management:    Induction: Intravenous  PONV Risk Score and Plan: 2 and Treatment may vary due to age or medical condition  Airway Management Planned: Oral ETT  Additional Equipment:   Intra-op Plan:   Post-operative Plan: Post-operative intubation/ventilation  Informed Consent: I have reviewed the patients History and Physical, chart, labs and discussed the procedure including the risks, benefits and alternatives for the proposed anesthesia with the patient or authorized representative who has indicated his/her understanding and acceptance.     Dental advisory given  Plan Discussed with: CRNA  Anesthesia Plan Comments:          Anesthesia Quick Evaluation

## 2024-10-02 NOTE — Plan of Care (Signed)

## 2024-10-03 ENCOUNTER — Inpatient Hospital Stay (HOSPITAL_COMMUNITY)

## 2024-10-03 DIAGNOSIS — G9340 Encephalopathy, unspecified: Secondary | ICD-10-CM | POA: Diagnosis not present

## 2024-10-03 DIAGNOSIS — Z89612 Acquired absence of left leg above knee: Secondary | ICD-10-CM

## 2024-10-03 DIAGNOSIS — S27322A Contusion of lung, bilateral, initial encounter: Secondary | ICD-10-CM | POA: Diagnosis not present

## 2024-10-03 DIAGNOSIS — Z89611 Acquired absence of right leg above knee: Secondary | ICD-10-CM | POA: Diagnosis not present

## 2024-10-03 DIAGNOSIS — Z5900 Homelessness unspecified: Secondary | ICD-10-CM

## 2024-10-03 DIAGNOSIS — N1832 Chronic kidney disease, stage 3b: Secondary | ICD-10-CM | POA: Diagnosis not present

## 2024-10-03 DIAGNOSIS — J9601 Acute respiratory failure with hypoxia: Secondary | ICD-10-CM | POA: Diagnosis not present

## 2024-10-03 DIAGNOSIS — N289 Disorder of kidney and ureter, unspecified: Secondary | ICD-10-CM

## 2024-10-03 DIAGNOSIS — E875 Hyperkalemia: Secondary | ICD-10-CM | POA: Diagnosis not present

## 2024-10-03 DIAGNOSIS — D649 Anemia, unspecified: Secondary | ICD-10-CM | POA: Diagnosis not present

## 2024-10-03 DIAGNOSIS — R569 Unspecified convulsions: Secondary | ICD-10-CM | POA: Diagnosis not present

## 2024-10-03 DIAGNOSIS — J939 Pneumothorax, unspecified: Secondary | ICD-10-CM | POA: Diagnosis not present

## 2024-10-03 DIAGNOSIS — J690 Pneumonitis due to inhalation of food and vomit: Secondary | ICD-10-CM | POA: Diagnosis not present

## 2024-10-03 DIAGNOSIS — N179 Acute kidney failure, unspecified: Secondary | ICD-10-CM | POA: Diagnosis not present

## 2024-10-03 DIAGNOSIS — Z91199 Patient's noncompliance with other medical treatment and regimen due to unspecified reason: Secondary | ICD-10-CM

## 2024-10-03 DIAGNOSIS — E1122 Type 2 diabetes mellitus with diabetic chronic kidney disease: Secondary | ICD-10-CM | POA: Diagnosis not present

## 2024-10-03 DIAGNOSIS — I5033 Acute on chronic diastolic (congestive) heart failure: Secondary | ICD-10-CM | POA: Diagnosis not present

## 2024-10-03 LAB — RENAL FUNCTION PANEL
Albumin: 2.4 g/dL — ABNORMAL LOW (ref 3.5–5.0)
Albumin: 2.4 g/dL — ABNORMAL LOW (ref 3.5–5.0)
Anion gap: 15 (ref 5–15)
Anion gap: 13 (ref 5–15)
BUN: 70 mg/dL — ABNORMAL HIGH (ref 8–23)
BUN: 60 mg/dL — ABNORMAL HIGH (ref 8–23)
CO2: 16 mmol/L — ABNORMAL LOW (ref 22–32)
CO2: 21 mmol/L — ABNORMAL LOW (ref 22–32)
Calcium: 7.7 mg/dL — ABNORMAL LOW (ref 8.9–10.3)
Calcium: 7.6 mg/dL — ABNORMAL LOW (ref 8.9–10.3)
Chloride: 105 mmol/L (ref 98–111)
Chloride: 103 mmol/L (ref 98–111)
Creatinine, Ser: 4.31 mg/dL — ABNORMAL HIGH (ref 0.61–1.24)
Creatinine, Ser: 3.71 mg/dL — ABNORMAL HIGH (ref 0.61–1.24)
GFR, Estimated: 14 mL/min — ABNORMAL LOW
GFR, Estimated: 17 mL/min — ABNORMAL LOW
Glucose, Bld: 149 mg/dL — ABNORMAL HIGH (ref 70–99)
Glucose, Bld: 144 mg/dL — ABNORMAL HIGH (ref 70–99)
Phosphorus: 7.4 mg/dL — ABNORMAL HIGH (ref 2.5–4.6)
Phosphorus: 6.1 mg/dL — ABNORMAL HIGH (ref 2.5–4.6)
Potassium: 5.6 mmol/L — ABNORMAL HIGH (ref 3.5–5.1)
Potassium: 4.7 mmol/L (ref 3.5–5.1)
Sodium: 136 mmol/L (ref 135–145)
Sodium: 137 mmol/L (ref 135–145)

## 2024-10-03 LAB — GLUCOSE, CAPILLARY
Glucose-Capillary: 133 mg/dL — ABNORMAL HIGH (ref 70–99)
Glucose-Capillary: 140 mg/dL — ABNORMAL HIGH (ref 70–99)
Glucose-Capillary: 141 mg/dL — ABNORMAL HIGH (ref 70–99)
Glucose-Capillary: 142 mg/dL — ABNORMAL HIGH (ref 70–99)
Glucose-Capillary: 169 mg/dL — ABNORMAL HIGH (ref 70–99)
Glucose-Capillary: 85 mg/dL (ref 70–99)

## 2024-10-03 LAB — CBC
HCT: 21.3 % — ABNORMAL LOW (ref 39.0–52.0)
Hemoglobin: 7.3 g/dL — ABNORMAL LOW (ref 13.0–17.0)
MCH: 28 pg (ref 26.0–34.0)
MCHC: 34.3 g/dL (ref 30.0–36.0)
MCV: 81.6 fL (ref 80.0–100.0)
Platelets: 187 K/uL (ref 150–400)
RBC: 2.61 MIL/uL — ABNORMAL LOW (ref 4.22–5.81)
RDW: 15.8 % — ABNORMAL HIGH (ref 11.5–15.5)
WBC: 8.8 K/uL (ref 4.0–10.5)
nRBC: 0.5 % — ABNORMAL HIGH (ref 0.0–0.2)

## 2024-10-03 LAB — MAGNESIUM: Magnesium: 2.1 mg/dL (ref 1.7–2.4)

## 2024-10-03 MED ORDER — VITAL 1.5 CAL PO LIQD
1000.0000 mL | ORAL | Status: DC
  Administered 2024-10-03 – 2024-10-11 (×8): 1000 mL
  Filled 2024-10-03 (×4): qty 1000

## 2024-10-03 MED ADMIN — ORAL CARE MOUTH RINSE: 15 mL | OROMUCOSAL | NDC 99999080097

## 2024-10-03 MED ADMIN — Acetaminophen Tab 500 MG: 1000 mg | NDC 50580045711

## 2024-10-03 MED ADMIN — Chlorhexidine Gluconate Pads 2%: 6 | TOPICAL | NDC 53462070523

## 2024-10-03 MED ADMIN — Oxycodone HCl Tab 5 MG: 5 mg | NDC 00406055223

## 2024-10-03 MED ADMIN — Docusate Sodium Liquid 150 MG/15ML: 100 mg | NDC 00904727966

## 2024-10-03 MED ADMIN — Heparin Sodium (Porcine) Inj 1000 Unit/ML: 2800 [IU] | INTRAVENOUS_CENTRAL | NDC 71288040201

## 2024-10-03 MED ADMIN — B-Complex w/ C & Folic Acid Tab 0.8 MG: 1 | NDC 60258016001

## 2024-10-03 MED ADMIN — Sodium Zirconium Cyclosilicate For Susp Packet 10 GM: 10 g | NDC 00310111001

## 2024-10-03 MED ADMIN — Protein Liquid (Enteral): 60 mL | NDC 9468801446

## 2024-10-03 MED ADMIN — Insulin Aspart Inj Soln 100 Unit/ML: 2 [IU] | SUBCUTANEOUS | NDC 73070010011

## 2024-10-03 MED ADMIN — Insulin Aspart Inj Soln 100 Unit/ML: 3 [IU] | SUBCUTANEOUS | NDC 73070010011

## 2024-10-03 MED ADMIN — Methocarbamol Tab 500 MG: 500 mg | NDC 70010075405

## 2024-10-03 MED ADMIN — Pantoprazole Sodium For IV Soln 40 MG (Base Equiv): 40 mg | INTRAVENOUS | NDC 00008092351

## 2024-10-03 MED ADMIN — Thiamine Mononitrate Tab 100 MG: 100 mg | NDC 54629005701

## 2024-10-03 MED ADMIN — Polyethylene Glycol 3350 Oral Packet 17 GM: 17 g | NDC 00904693186

## 2024-10-03 MED ADMIN — Propofol IV Emul 1000 MG/100ML (10 MG/ML): 25 ug/kg/min | INTRAVENOUS | NDC 00069024801

## 2024-10-03 MED ADMIN — Fentanyl Citrate Preservative Free (PF) Inj 100 MCG/2ML: 50 ug | INTRAVENOUS

## 2024-10-03 MED FILL — B-Complex w/ C & Folic Acid Tab 0.8 MG: 1.0000 | ORAL | Qty: 1 | Status: AC

## 2024-10-03 MED FILL — Thiamine Mononitrate Tab 100 MG: 100.0000 mg | ORAL | Qty: 1 | Status: AC

## 2024-10-03 MED FILL — Protein Liquid (Enteral): 60.0000 mL | ENTERAL | Qty: 60 | Status: AC

## 2024-10-03 MED FILL — Sodium Bicarbonate IV Soln 8.4%: INTRAVENOUS | Qty: 150 | Status: AC

## 2024-10-03 NOTE — Progress Notes (Signed)
   ORTHOPAEDIC PROGRESS NOTE  s/p Procedure(s): LEFT FEMUR INTRAMEDULLARY ROD, RETROGRADE on 11/28 with Dr. Beverley  SUBJECTIVE: Sedated on vent  OBJECTIVE: PE: Bilateral BKAs LLE: incisions CDI.   Vitals:   10/03/24 1027 10/03/24 1030  BP:  113/78  Pulse:  76  Resp:  14  Temp:    SpO2: 100% 100%    Opiates Today (MME): Today's  total administered Morphine  Milligram Equivalents: 204.75 Opiates Yesterday (MME): Yesterday's total administered Morphine  Milligram Equivalents: 253.75 Opiates Used in the last two days:  Inpatient Morphine  Milligram Equivalents Per Day 11/26 - 11/29   Values displayed are in units of MME/Day    Order Start / End Date 11/26 11/27 Yesterday Today    fentaNYL  (SUBLIMAZE ) injection 100 mcg 11/26 - 11/26 30 of 30 -- -- --    HYDROmorphone  (DILAUDID ) injection 2 mg 11/26 - 11/26 40 of 40 -- -- --    oxyCODONE  (Oxy IR/ROXICODONE ) immediate release tablet 5 mg 11/27 - 11/27 -- 0 of 30 -- --    HYDROmorphone  (DILAUDID ) injection 1 mg 11/27 - No end date -- 0 of 240 0 of 240 0 of 240    fentaNYL  (SUBLIMAZE ) injection 25-50 mcg 11/27 - 11/29 -- 0 of 7.5-15 -- --    fentaNYL  in NS (10mcg/ml) infusion-PREMIX 11/27 - No end date -- 77.75 of 0-1,170 246.5 of 0-2,880 197.25 of 0-2,880    fentaNYL  (SUBLIMAZE ) bolus via infusion 25-100 mcg 11/27 - No end date -- 0 of 322.5-1,290 0 of 720-2,880 0 of 720-2,880    oxyCODONE  (Oxy IR/ROXICODONE ) immediate release tablet 5 mg 11/27 - No end date -- 0 of 22.5 7.5 of 45 7.5 of 45    Daily Totals  70 of 70 77.75 of 622.5-2,767.5 254 of 1,005-6,045 204.75 of 1,005-6,045       ASSESSMENT: Scott Greer is a 68 y.o. male POD#1  PLAN: Weightbearing: Mobilize as tolerated Insicional and dressing care: Reinforce dressings as needed Orthopedic device(s): None Showering: Hold VTE prophylaxis: per primary team Pain control: PRN Follow - up plan: TBD   Left humerus fracture - non-operative management. NWB  LUE in sling.  Bilateral pubic rami fracture - nonoperative management. WBAT  Contact information:  After hours and holidays please check [email or web address] for group call information for [facility]  Aleck Stalling, PA-C 10/03/24
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
## 2024-10-03 NOTE — Progress Notes (Signed)
 Pt transported from 4N31 to CT and back by RN and RT w/o complications

## 2024-10-03 NOTE — Progress Notes (Addendum)
 Patient ID: Scott Greer, male   DOB: 1956/06/18, 68 y.o.   MRN: [id number] Follow up - Trauma Critical Care   Patient Details:    Scott Greer is an 68 y.o. male.  Lines/tubes : Airway 7 mm (Active)  Secured at (cm) 26 cm 10/03/24 0830  Measured From Lips 10/03/24 0830  Secured Location Center 10/03/24 0830  Secured By [employer] 10/03/24 0830  Bite Block No 10/03/24 0830  Tube Holder Repositioned Yes 10/03/24 0733  Prone position No 10/03/24 0733  Cuff Pressure (cm H2O) Red OR 40-60 CmH2O 10/03/24 0323  Site Condition Dry 10/03/24 0733     NG/OG Vented/Dual Lumen Oral (Active)  Tube Position (Required) Marking at nare/corner of mouth 10/03/24 0800  Measurement (cm) (Required) 63 cm 10/03/24 0800  Ongoing Placement Verification (Required) (See row information) Yes 10/03/24 0800  Site Assessment Clean, Dry, Intact 10/03/24 0800  Interventions Clamped 10/03/24 0800  Status Clamped 10/02/24 2100  Intake (mL) 0 mL 10/03/24 0900  Output (mL) 0 mL 10/03/24 0900     External Urinary Catheter (Active)  Securement Method None needed 10/03/24 0800  Site Assessment Clean, Dry, Intact 10/03/24 0800  Intervention No interventions needed at this time 10/03/24 0800  Output (mL) 0 mL 10/03/24 0900    Microbiology/Sepsis markers: Results for orders placed or performed during the hospital encounter of 09/30/24  MRSA Next Gen by PCR, Nasal     Status: None   Collection Time: 10/01/24  6:08 PM   Specimen: Nasal Mucosa; Nasal Swab  Result Value Ref Range Status   MRSA by PCR Next Gen NOT DETECTED NOT DETECTED Final    Comment: (NOTE) The GeneXpert MRSA Assay (FDA approved for NASAL specimens only), is one component of a comprehensive MRSA colonization surveillance program. It is not intended to diagnose MRSA infection nor to guide or monitor treatment for MRSA infections. Test performance is not FDA approved in patients less than 77 years old. Performed at [facility], [address] [place], [place] [address]     Anti-infectives:  Anti-infectives (From admission, onward)    Start     Dose/Rate Route Frequency Ordered Stop   10/03/24 0200  ceFEPIme  (MAXIPIME ) 2 g in sodium chloride  0.9 % 100 mL IVPB  Status:  Discontinued        2 g 200 mL/hr over 30 Minutes Intravenous Every 24 hours 10/02/24 0735 10/02/24 0905   10/02/24 1700  ceFAZolin  (ANCEF ) IVPB 2g/100 mL premix        2 g 200 mL/hr over 30 Minutes Intravenous Every 6 hours 10/02/24 1614 10/02/24 2314   10/02/24 1316  vancomycin  (VANCOCIN ) powder  Status:  Discontinued          As needed 10/02/24 1316 10/02/24 1401   10/02/24 1300  Ampicillin-Sulbactam (UNASYN) 3 g in sodium chloride  0.9 % 100 mL IVPB        3 g 200 mL/hr over 30 Minutes Intravenous Every 12 hours 10/02/24 0905 10/04/24 0059   10/01/24 1400  ceFEPIme  (MAXIPIME ) 2 g in sodium chloride  0.9 % 100 mL IVPB  Status:  Discontinued        2 g 200 mL/hr over 30 Minutes Intravenous Every 12 hours 10/01/24 1348 10/02/24 0735   09/30/24 2330  piperacillin -tazobactam (ZOSYN ) IVPB 3.375 g        3.375 g 100 mL/hr over 30 Minutes Intravenous  Once 09/30/24 2316 09/30/24 2344   09/30/24 2315  ceFAZolin  (ANCEF ) IVPB 2g/100 mL  premix  Status:  Discontinued        2 g 200 mL/hr over 30 Minutes Intravenous Every 8 hours 09/30/24 2314 09/30/24 2316        Consults: Treatment Team:  Md, Trauma, MD Celena Sharper, MD Beverley Evalene BIRCH, MD [name], MD    Studies:    Events:  Subjective:    Overnight Issues: CRRT  Objective:  Vital signs for last 24 hours: Temp:  [97.7 F (36.5 C)-98.8 F (37.1 C)] 98.5 F (36.9 C) (11/29 0800) Pulse Rate:  [53-90] 77 (11/29 0900) Resp:  [10-24] 13 (11/29 0900) BP: (80-116)/(61-81) 111/65 (11/29 0900) SpO2:  [90 %-100 %] 100 % (11/29 0900) FiO2 (%):  [40 %] 40 % (11/29 0800) Weight:  [91 kg] 91 kg (11/29 0500)  Hemodynamic parameters for last 24 hours:     Intake/Output from previous day: 11/28 0701 - 11/29 0700 In: 3336.6 [I.V.:2180.8; NG/GT:344; IV Piggyback:811.8] Out: 2576.5 [Blood:50]  Intake/Output this shift: Total I/O In: 81.8 [I.V.:81.8] Out: 238.9   Vent settings for last 24 hours: Vent Mode: PRVC FiO2 (%):  [40 %] 40 % Set Rate:  [20 bmp] 20 bmp Vt Set:  [379 mL] 620 mL PEEP:  [5 cmH20] 5 cmH20 Plateau Pressure:  [15 cmH20-22 cmH20] 15 cmH20  Physical Exam:  General: on vent Neuro: opened eyes to stim, not clearly F/C HEENT/Neck: ETT Resp: few rhonchi CVS: RRR GI: soft, NT Extremities: B BKA, L ortho dressings  Results for orders placed or performed during the hospital encounter of 09/30/24 (from the past 24 hours)  Hemoglobin A1c     Status: Abnormal   Collection Time: 10/02/24 10:29 AM  Result Value Ref Range   Hgb A1c MFr Bld 6.2 (H) 4.8 - 5.6 %   Mean Plasma Glucose 131 mg/dL  Glucose, capillary     Status: Abnormal   Collection Time: 10/02/24 11:24 AM  Result Value Ref Range   Glucose-Capillary 139 (H) 70 - 99 mg/dL  Prepare RBC (crossmatch)     Status: None   Collection Time: 10/02/24 11:54 AM  Result Value Ref Range   Order Confirmation      ORDER PROCESSED BY BLOOD BANK Performed at [facility], [address], [place], [place] [address]   Basic metabolic panel     Status: Abnormal   Collection Time: 10/02/24  2:37 PM  Result Value Ref Range   Sodium 134 (L) 135 - 145 mmol/L   Potassium 6.2 (H) 3.5 - 5.1 mmol/L   Chloride 102 98 - 111 mmol/L   CO2 18 (L) 22 - 32 mmol/L   Glucose, Bld 180 (H) 70 - 99 mg/dL   BUN 82 (H) 8 - 23 mg/dL   Creatinine, Ser 4.78 (H) 0.61 - 1.24 mg/dL   Calcium  8.0 (L) 8.9 - 10.3 mg/dL   GFR, Estimated 11 (L) >60 mL/min   Anion gap 14 5 - 15  Renal function panel     Status: Abnormal   Collection Time: 10/02/24  2:37 PM  Result Value Ref Range   Sodium 136 135 - 145 mmol/L   Potassium 6.1 (H) 3.5 - 5.1 mmol/L   Chloride 103 98 - 111 mmol/L   CO2 16 (L)  22 - 32 mmol/L   Glucose, Bld 177 (H) 70 - 99 mg/dL   BUN 84 (H) 8 - 23 mg/dL   Creatinine, Ser 4.81 (H) 0.61 - 1.24 mg/dL   Calcium  8.0 (L) 8.9 - 10.3 mg/dL  Phosphorus 9.0 (H) 2.5 - 4.6 mg/dL   Albumin  2.7 (L) 3.5 - 5.0 g/dL   GFR, Estimated 12 (L) >60 mL/min   Anion gap 17 (H) 5 - 15  Glucose, capillary     Status: Abnormal   Collection Time: 10/02/24  3:27 PM  Result Value Ref Range   Glucose-Capillary 185 (H) 70 - 99 mg/dL  Glucose, capillary     Status: Abnormal   Collection Time: 10/02/24  8:33 PM  Result Value Ref Range   Glucose-Capillary 177 (H) 70 - 99 mg/dL  Glucose, capillary     Status: Abnormal   Collection Time: 10/02/24 11:58 PM  Result Value Ref Range   Glucose-Capillary 169 (H) 70 - 99 mg/dL  CBC     Status: Abnormal   Collection Time: 10/03/24  3:23 AM  Result Value Ref Range   WBC 8.8 4.0 - 10.5 K/uL   RBC 2.61 (L) 4.22 - 5.81 MIL/uL   Hemoglobin 7.3 (L) 13.0 - 17.0 g/dL   HCT 78.6 (L) 60.9 - 47.9 %   MCV 81.6 80.0 - 100.0 fL   MCH 28.0 26.0 - 34.0 pg   MCHC 34.3 30.0 - 36.0 g/dL   RDW 84.1 (H) 88.4 - 84.4 %   Platelets 187 150 - 400 K/uL   nRBC 0.5 (H) 0.0 - 0.2 %  Renal function panel     Status: Abnormal   Collection Time: 10/03/24  3:23 AM  Result Value Ref Range   Sodium 136 135 - 145 mmol/L   Potassium 5.6 (H) 3.5 - 5.1 mmol/L   Chloride 105 98 - 111 mmol/L   CO2 16 (L) 22 - 32 mmol/L   Glucose, Bld 149 (H) 70 - 99 mg/dL   BUN 70 (H) 8 - 23 mg/dL   Creatinine, Ser 5.68 (H) 0.61 - 1.24 mg/dL   Calcium  7.7 (L) 8.9 - 10.3 mg/dL   Phosphorus 7.4 (H) 2.5 - 4.6 mg/dL   Albumin  2.4 (L) 3.5 - 5.0 g/dL   GFR, Estimated 14 (L) >60 mL/min   Anion gap 15 5 - 15  Magnesium      Status: None   Collection Time: 10/03/24  3:23 AM  Result Value Ref Range   Magnesium  2.1 1.7 - 2.4 mg/dL  Glucose, capillary     Status: Abnormal   Collection Time: 10/03/24  3:59 AM  Result Value Ref Range   Glucose-Capillary 140 (H) 70 - 99 mg/dL  Glucose, capillary      Status: None   Collection Time: 10/03/24  8:29 AM  Result Value Ref Range   Glucose-Capillary 85 70 - 99 mg/dL    Assessment & Plan: Present on Admission:  Trauma    LOS: 2 days   Additional comments:I reviewed the patient's new clinical lab test results. / 68 y/o M wheelchair vs car  Open L femur fx - IMN by Dr. Beverley 11/28 L femoral neck fx - above L humerus fx - non-op per Dr. Beverley, NWB Bilateral pubic rami fx - Dr. Celena consulted Bilateral sacral fx - per Ortho L2-3 TP fx - Pain management, PT/OT when able R 5-7 Rib fx, L8-10 Rib fx - pulmonary toilet/IS, multimodal pain control Small R PTX - Repeat CXR in AM stable Anemia (suspect acute on chronic) - Hb 5.4 on 11/28, s/p 2 pRBCs 11/28. Hb 7.3  AKI on CKD - CRRT per Renal Multiple chronic conditions, including CHF, CKD - appreciate CCM management  FEN - NPO, will consider TF post-op  VTE - Holding  ID - Zosyn  for suspected aspiration, send resp CX Dispo - ICU, CRRT, Vent  Critical Care Total Time*: 32 Minutes  Dann Hummer, MD, MPH, FACS Trauma & General Surgery Use [email or web address] to contact on call provider  10/03/2024  *Care during the described time interval was provided by me. I have reviewed this patient's available data, including medical history, events of note, physical examination and test results as part of my evaluation.

## 2024-10-03 NOTE — Progress Notes (Signed)
 Brief Nutrition Support Note  New consult received when pt initiated on CRRT. Reviewed events overnight. Pt is now s/p left femur intramedullary rod fixation. Pt with poor urine output yesterday after receiving lasix  and decision was made to initiate CRRT. Attempted to liberate from vent this AM, but poorly responsive so unable to wean. Discussed with CCM and trauma team, ok to initiate enteral recommendations that were left by RD 11/28. For last full assessment see note from 11/28. Will continue to monitor and follow-up as planned.    INTERVENTION:    Recommend Initiate tube feeding via OG tube: Vital 1.5 at 20 ml/h and increase by 10 ml every 8 hours to goal rate of 50 ml/hr (1200 ml per day)   Prosource TF20 60 ml BID   Provides 1980 kcal, 121 gm protein, 912 ml free water  daily   Recommend supplementing thiamine  prior to initiation of nutrition support and to monitor magnesium  and phosphorus daily x 4 occurrences, MD to replete as needed, as pt is at risk for refeeding syndrome given pt with hx of polysubstance abuse.    Vernell Lukes, RD, LDN, CNSC Registered Dietitian II Please reach out via secure chat

## 2024-10-03 NOTE — Procedures (Addendum)
 History: 68 yo M with encephalopathy, eeg to rule out seizure  EEG Duration: 27 minutes  Sedation: none  Patient State: Awake and asleep  Technique: This EEG was acquired with electrodes placed according to the International 10-20 electrode system (including Fp1, Fp2, F3, F4, C3, C4, P3, P4, O1, O2, T3, T4, T5, T6, A1, A2, Fz, Cz, Pz). The following electrodes were missing or displaced: none.   Background: The background consists primarily of diffuse delta and theta reange activity, though there is briefly seen a posterior alpha rhythm of 9 Hz.  With arousal, there are runs of frontally predominant generalized discharges with triphasic morphology, reaching a frequency of 2 Hz, without clear evolution, but there is waxing/waning and at times it does appear more prominent on the right side.  Photic stimulation: Physiologic driving is not performed  EEG Abnormalities: 1) generalized periodic discharges with triphasic morphology, without evolution but sometimes appearing more lateralized to the right 2) generalized irregular slow activity  Clinical Interpretation: This EEG recorded evidence of a generalized nonspecific cerebral dysfunction.  There are runs of periodic discharges which I suspect are reflective of encephalopathy, though these can be rarely epileptic in nature.  Recommend further EEG monitoring to further characterize this pattern and clinical correlation.   Aisha Seals, MD [facility]   If 7pm- 7am, please page neurology on call as listed in AMION.

## 2024-10-03 NOTE — Progress Notes (Addendum)
 LTM VIDEO EEG hooked up and running - no initial skin breakdown - push button tested - [facility] is monitoring.

## 2024-10-03 NOTE — Progress Notes (Signed)
 EEG to be completed after CT scan. Nurse aware.

## 2024-10-03 NOTE — Progress Notes (Addendum)
 Routine EEG completed, results pending Neurology review and interpretation

## 2024-10-03 NOTE — Procedures (Addendum)
 Central Venous Catheter Insertion Procedure Note  Scott Greer  [id number]  1956/09/29  Date: 10/02/24 Time: 15:30  Provider Performing:Terri Malerba N Agata Lucente   Procedure: Insertion of Non-tunneled Central Venous Catheter(36556)with US  guidance (23062)    Indication(s) Hemodialysis  Consent Unable to obtain consent due to emergent nature of procedure.  Anesthesia Topical only with 1% lidocaine    Timeout Verified patient identification, verified procedure, site/side was marked, verified correct patient position, special equipment/implants available, medications/allergies/relevant history reviewed, required imaging and test results available.  Sterile Technique Maximal sterile technique including full sterile barrier drape, hand hygiene, sterile gown, sterile gloves, mask, hair covering, sterile ultrasound probe cover (if used).  Procedure Description Area of catheter insertion was cleaned with chlorhexidine  and draped in sterile fashion.   With real-time ultrasound guidance a HD catheter was placed into the left internal jugular vein.  Nonpulsatile blood flow and easy flushing noted in all ports.  The catheter was sutured in place and sterile dressing applied.  Complications/Tolerance None; patient tolerated the procedure well. Chest X-ray is ordered to verify placement for internal jugular or subclavian cannulation.  Chest x-ray is not ordered for femoral cannulation.  EBL Minimal  Specimen(s) None

## 2024-10-03 NOTE — Progress Notes (Signed)
 Patient ID: Verbon Giangregorio, male   DOB: December 25, 1955, 68 y.o.   MRN: [id number] [facility] Progress Note   Assessment/ Plan:   1.  Acute kidney injury on chronic kidney disease stage III: Likely ischemic ATN in the setting of multifocal trauma/acute blood loss anemia and relative hypotension without significant impact from rhabdomyolysis.  Started on CRRT yesterday with worsening oliguria and metabolic abnormalities including hyperkalemia.  Will increase ultrafiltration goal to 100 cc/h based on current volume status. 2.  Hyperkalemia: Secondary to acute kidney injury along with tissue injury.  Improving on CRRT, will monitor subsequent labs to decide on need to change potassium in dialysate. 3.  Anion gap metabolic acidosis: Secondary to acute kidney injury, refractory to earlier medical measures and started on CRRT.SABRA 4.  Multifocal trauma: Multiple osseous injuries and status post left femoral internal fixation with intramedullary rod yesterday with additional management per trauma surgery.  Remains ventilator dependent with ongoing management for bilateral pulmonary contusions and small right pneumothorax.  On broad-spectrum antimicrobial therapy with Unasyn. 5.  Anemia: Appears likely acute blood loss anemia associated with trauma.  Status post earlier PRBC transfusion with stable hemoglobin/hematocrit.  Subjective:   Some hypotension noted overnight but currently hemodynamically stable on CRRT.   Objective:   BP 113/73 (BP Location: Right Arm)   Pulse 79   Temp 97.7 F (36.5 C) (Axillary)   Resp 10   Ht 6' (1.829 m)   Wt 91 kg   SpO2 100%   BMI 27.21 kg/m   Intake/Output Summary (Last 24 hours) at 10/03/2024 0836 Last data filed at 10/03/2024 0800 Gross per 24 hour  Intake 3416.5 ml  Output 2717.2 ml  Net 699.3 ml   Weight change: -0.5 kg  Physical Exam: Gen: Intubated, sedated, on CRRT CVS: Pulse regular rhythm, normal rate, S1 and S2 normal Resp:  Anteriorly clear to auscultation bilaterally, no rales/rhonchi Abd: Soft, obese, nontender, bowel sounds normal Ext: 1-2+ edema over bilateral BKA stumps and upper extremities.  Imaging: DG CHEST PORT 1 VIEW Result Date: 10/02/2024 CLINICAL DATA:  Central line placement. EXAM: PORTABLE CHEST 1 VIEW COMPARISON:  10/01/2024 and older studies.  CT, 09/30/2024. FINDINGS: New left internal jugular dual lumen central venous catheter has its tip in the mid superior vena cava. No pneumothorax. Endotracheal tube has its tip at the level of the medial clavicles, unchanged. Nasogastric tube passes below the diaphragm into the stomach, also unchanged. Mild opacities noted in the right upper lobe adjacent to the minor fissure consistent with atelectasis. Lungs otherwise clear. IMPRESSION: 1. Left internal jugular central venous catheter, tip in the mid superior vena cava. No pneumothorax. 2. Mild right upper lobe opacity adjacent to the minor fissure suspected to be atelectasis. 3. Stable endotracheal tube and nasogastric tube. Electronically Signed   By: Alm Parkins M.D.   On: 10/02/2024 16:48   DG FEMUR MIN 2 VIEWS LEFT Result Date: 10/02/2024 CLINICAL DATA:  Left hip fracture. EXAM: LEFT FEMUR 2 VIEWS COMPARISON:  None Available. FINDINGS: Three intraoperative spot images show 2 compression screws transfixing a left femoral neck fracture in near anatomic alignment. Femoral intramedullary rod is also seen with proximal and distal fixation screws. IMPRESSION: Internal fixation of left hip fracture in near anatomic alignment. Femoral intramedullary rod also noted. Electronically Signed   By: Norleen DELENA Kil M.D.   On: 10/02/2024 14:33   DG C-Arm 1-60 Min-No Report Result Date: 10/02/2024 Fluoroscopy was utilized by the requesting physician.  No radiographic interpretation.  DG C-Arm 1-60 Min-No Report Result Date: 10/02/2024 Fluoroscopy was utilized by the requesting physician.  No radiographic interpretation.    DG CHEST PORT 1 VIEW Result Date: 10/01/2024 CLINICAL DATA:  Status post intubation EXAM: PORTABLE CHEST 1 VIEW COMPARISON:  Chest radiograph dated 10/01/2024 FINDINGS: Lines/tubes: Endotracheal tube tip projects 5.9 cm above the carina. Enteric tube tip reaches the diaphragm and terminates below the field of view. Lungs: Well inflated lungs. Decreased bilateral interstitial and diffuse patchy opacities. Pleura: No definite pneumothorax or pleural effusion. Heart/mediastinum: Similar enlarged cardiomediastinal silhouette. Bones: Known rib fractures are better evaluated on prior CT. IMPRESSION: 1. Endotracheal tube tip projects 5.9 cm above the carina. 2. Decreased bilateral interstitial and diffuse patchy opacities, which may represent improving atelectasis/pulmonary edema. Electronically Signed   By: Limin  Xu M.D.   On: 10/01/2024 14:40   DG Abd 1 View Result Date: 10/01/2024 CLINICAL DATA:  Enteric tube placement EXAM: ABDOMEN - 1 VIEW COMPARISON:  CT abdomen and pelvis dated 09/30/2024 FINDINGS: Gastric/enteric tube tip projects over the stomach. Left rib fractures, better evaluated on prior CT. Partially imaged gas and stool filled large bowel loops. IMPRESSION: Gastric/enteric tube tip projects over the stomach. Electronically Signed   By: Limin  Xu M.D.   On: 10/01/2024 14:38    Labs: BMET Recent Labs  Lab 09/30/24 2317 09/30/24 2332 10/01/24 1317 10/01/24 1524 10/01/24 1826 10/02/24 0655 10/02/24 1437 10/03/24 0323  NA 137 135 134* 134* 137 135 136  134* 136  K 5.4* 5.5* 5.8* 6.1* 5.7* 5.6* 6.1*  6.2* 5.6*  CL 106 105 103  --  107 102 103  102 105  CO2 20*  --  16*  --  15* 17* 16*  18* 16*  GLUCOSE 138* 132* 194*  --  131* 143* 177*  180* 149*  BUN 51* 50* 66*  --  70* 80* 84*  82* 70*  CREATININE 2.98* 3.20* 3.45*  --  3.83* 4.81* 5.18*  5.21* 4.31*  CALCIUM  8.4*  --  8.0*  --  8.3* 8.2* 8.0*  8.0* 7.7*  PHOS  --   --   --   --   --   --  9.0* 7.4*   CBC Recent  Labs  Lab 10/01/24 1317 10/01/24 1524 10/01/24 1826 10/02/24 0655 10/03/24 0323  WBC 14.5*  --  13.3* 9.8 8.8  HGB 6.5* 6.8* 8.4* 7.8* 7.3*  HCT 20.1* 20.0* 25.5* 22.4* 21.3*  MCV 81.7  --  82.0 78.6* 81.6  PLT 284  --  230 191 187    Medications:     sodium chloride    Intravenous Once   sodium chloride    Intravenous Once   acetaminophen   1,000 mg Per Tube Q6H   Chlorhexidine  Gluconate Cloth  6 each Topical Daily   docusate  100 mg Per Tube BID   docusate sodium   100 mg Oral BID   fentaNYL  (SUBLIMAZE ) injection  25-50 mcg Intravenous Once   insulin  aspart  0-15 Units Subcutaneous Q4H   methocarbamol   500 mg Per Tube Q8H   Or   methocarbamol  (ROBAXIN ) injection  500 mg Intravenous Q8H   mouth rinse  15 mL Mouth Rinse Q2H   pantoprazole  (PROTONIX ) IV  40 mg Intravenous Q24H   polyethylene glycol  17 g Per Tube Daily   sodium zirconium cyclosilicate   10 g Per Tube TID    Gordy Blanch, MD 10/03/2024, 8:36 AM

## 2024-10-04 ENCOUNTER — Inpatient Hospital Stay (HOSPITAL_COMMUNITY)

## 2024-10-04 ENCOUNTER — Encounter (HOSPITAL_COMMUNITY): Payer: Self-pay

## 2024-10-04 DIAGNOSIS — S069XAA Unspecified intracranial injury with loss of consciousness status unknown, initial encounter: Secondary | ICD-10-CM | POA: Diagnosis not present

## 2024-10-04 DIAGNOSIS — D649 Anemia, unspecified: Secondary | ICD-10-CM | POA: Diagnosis not present

## 2024-10-04 DIAGNOSIS — E1122 Type 2 diabetes mellitus with diabetic chronic kidney disease: Secondary | ICD-10-CM | POA: Diagnosis not present

## 2024-10-04 DIAGNOSIS — N19 Unspecified kidney failure: Secondary | ICD-10-CM | POA: Diagnosis not present

## 2024-10-04 DIAGNOSIS — Z5900 Homelessness unspecified: Secondary | ICD-10-CM | POA: Diagnosis not present

## 2024-10-04 DIAGNOSIS — J189 Pneumonia, unspecified organism: Secondary | ICD-10-CM | POA: Diagnosis not present

## 2024-10-04 DIAGNOSIS — J9601 Acute respiratory failure with hypoxia: Secondary | ICD-10-CM | POA: Diagnosis not present

## 2024-10-04 DIAGNOSIS — Z91199 Patient's noncompliance with other medical treatment and regimen due to unspecified reason: Secondary | ICD-10-CM | POA: Diagnosis not present

## 2024-10-04 DIAGNOSIS — I5033 Acute on chronic diastolic (congestive) heart failure: Secondary | ICD-10-CM | POA: Diagnosis not present

## 2024-10-04 DIAGNOSIS — G9340 Encephalopathy, unspecified: Secondary | ICD-10-CM | POA: Diagnosis not present

## 2024-10-04 DIAGNOSIS — N179 Acute kidney failure, unspecified: Secondary | ICD-10-CM | POA: Diagnosis not present

## 2024-10-04 DIAGNOSIS — J939 Pneumothorax, unspecified: Secondary | ICD-10-CM | POA: Diagnosis not present

## 2024-10-04 DIAGNOSIS — Z89612 Acquired absence of left leg above knee: Secondary | ICD-10-CM | POA: Diagnosis not present

## 2024-10-04 DIAGNOSIS — E875 Hyperkalemia: Secondary | ICD-10-CM | POA: Diagnosis not present

## 2024-10-04 DIAGNOSIS — J690 Pneumonitis due to inhalation of food and vomit: Secondary | ICD-10-CM | POA: Diagnosis not present

## 2024-10-04 DIAGNOSIS — Z89611 Acquired absence of right leg above knee: Secondary | ICD-10-CM | POA: Diagnosis not present

## 2024-10-04 DIAGNOSIS — N1832 Chronic kidney disease, stage 3b: Secondary | ICD-10-CM | POA: Diagnosis not present

## 2024-10-04 DIAGNOSIS — S27322A Contusion of lung, bilateral, initial encounter: Secondary | ICD-10-CM | POA: Diagnosis not present

## 2024-10-04 LAB — TYPE AND SCREEN
ABO/RH(D): A POS
Antibody Screen: NEGATIVE
Unit division: 0
Unit division: 0
Unit division: 0
Unit division: 0
Unit division: 0
Unit division: 0
Unit division: 0
Unit division: 0

## 2024-10-04 LAB — RENAL FUNCTION PANEL
Albumin: 2.4 g/dL — ABNORMAL LOW (ref 3.5–5.0)
Albumin: 2.3 g/dL — ABNORMAL LOW (ref 3.5–5.0)
Anion gap: 16 — ABNORMAL HIGH (ref 5–15)
Anion gap: 13 (ref 5–15)
BUN: 58 mg/dL — ABNORMAL HIGH (ref 8–23)
BUN: 58 mg/dL — ABNORMAL HIGH (ref 8–23)
CO2: 25 mmol/L (ref 22–32)
CO2: 28 mmol/L (ref 22–32)
Calcium: 7.5 mg/dL — ABNORMAL LOW (ref 8.9–10.3)
Calcium: 7.4 mg/dL — ABNORMAL LOW (ref 8.9–10.3)
Chloride: 94 mmol/L — ABNORMAL LOW (ref 98–111)
Chloride: 97 mmol/L — ABNORMAL LOW (ref 98–111)
Creatinine, Ser: 3.32 mg/dL — ABNORMAL HIGH (ref 0.61–1.24)
Creatinine, Ser: 3.16 mg/dL — ABNORMAL HIGH (ref 0.61–1.24)
GFR, Estimated: 20 mL/min — ABNORMAL LOW
GFR, Estimated: 21 mL/min — ABNORMAL LOW
Glucose, Bld: 180 mg/dL — ABNORMAL HIGH (ref 70–99)
Glucose, Bld: 178 mg/dL — ABNORMAL HIGH (ref 70–99)
Phosphorus: 4.9 mg/dL — ABNORMAL HIGH (ref 2.5–4.6)
Phosphorus: 4.9 mg/dL — ABNORMAL HIGH (ref 2.5–4.6)
Potassium: 3.9 mmol/L (ref 3.5–5.1)
Potassium: 3.6 mmol/L (ref 3.5–5.1)
Sodium: 135 mmol/L (ref 135–145)
Sodium: 138 mmol/L (ref 135–145)

## 2024-10-04 LAB — BPAM RBC
Blood Product Expiration Date: 202512122359
Blood Product Expiration Date: 202512122359
Blood Product Expiration Date: 202512192359
Blood Product Expiration Date: 202512202359
Blood Product Expiration Date: 202512222359
Blood Product Expiration Date: 202512222359
Blood Product Expiration Date: 202512222359
Blood Product Expiration Date: 202512222359
ISSUE DATE / TIME: 202511270243
ISSUE DATE / TIME: 202511270539
ISSUE DATE / TIME: 202511271429
ISSUE DATE / TIME: 202511271429
ISSUE DATE / TIME: 202511281157
ISSUE DATE / TIME: 202511281157
Unit Type and Rh: 6200
Unit Type and Rh: 6200
Unit Type and Rh: 6200
Unit Type and Rh: 6200
Unit Type and Rh: 6200
Unit Type and Rh: 6200
Unit Type and Rh: 6200
Unit Type and Rh: 6200

## 2024-10-04 LAB — PREPARE RBC (CROSSMATCH)

## 2024-10-04 LAB — CBC
HCT: 21.7 % — ABNORMAL LOW (ref 39.0–52.0)
Hemoglobin: 7.3 g/dL — ABNORMAL LOW (ref 13.0–17.0)
MCH: 27.1 pg (ref 26.0–34.0)
MCHC: 33.6 g/dL (ref 30.0–36.0)
MCV: 80.7 fL (ref 80.0–100.0)
Platelets: 170 K/uL (ref 150–400)
RBC: 2.69 MIL/uL — ABNORMAL LOW (ref 4.22–5.81)
RDW: 15.6 % — ABNORMAL HIGH (ref 11.5–15.5)
WBC: 9.5 K/uL (ref 4.0–10.5)
nRBC: 0.6 % — ABNORMAL HIGH (ref 0.0–0.2)

## 2024-10-04 LAB — HEMOGLOBIN AND HEMATOCRIT, BLOOD
HCT: 22.4 % — ABNORMAL LOW (ref 39.0–52.0)
Hemoglobin: 7.3 g/dL — ABNORMAL LOW (ref 13.0–17.0)

## 2024-10-04 LAB — GLUCOSE, CAPILLARY
Glucose-Capillary: 147 mg/dL — ABNORMAL HIGH (ref 70–99)
Glucose-Capillary: 153 mg/dL — ABNORMAL HIGH (ref 70–99)
Glucose-Capillary: 161 mg/dL — ABNORMAL HIGH (ref 70–99)
Glucose-Capillary: 167 mg/dL — ABNORMAL HIGH (ref 70–99)
Glucose-Capillary: 173 mg/dL — ABNORMAL HIGH (ref 70–99)
Glucose-Capillary: 184 mg/dL — ABNORMAL HIGH (ref 70–99)
Glucose-Capillary: 196 mg/dL — ABNORMAL HIGH (ref 70–99)

## 2024-10-04 LAB — MAGNESIUM: Magnesium: 2.2 mg/dL (ref 1.7–2.4)

## 2024-10-04 MED ORDER — SODIUM CHLORIDE 0.9 % IV SOLN
500.0000 [IU]/h | INTRAVENOUS | Status: DC
  Administered 2024-10-04 – 2024-10-06 (×3): 500 [IU]/h via INTRAVENOUS_CENTRAL
  Filled 2024-10-04 (×3): qty 10000
  Filled 2024-10-04: qty 2

## 2024-10-04 MED ORDER — FUROSEMIDE 10 MG/ML IJ SOLN
80.0000 mg | Freq: Once | INTRAMUSCULAR | Status: AC
  Administered 2024-10-04: 80 mg via INTRAVENOUS
  Filled 2024-10-04: qty 8

## 2024-10-04 MED ORDER — SODIUM CHLORIDE 0.9% IV SOLUTION: Freq: Once | INTRAVENOUS | Status: AC

## 2024-10-04 MED ORDER — ALBUMIN HUMAN 25 % IV SOLN
12.5000 g | Freq: Once | INTRAVENOUS | Status: AC
  Administered 2024-10-04: 12.5 g via INTRAVENOUS
  Filled 2024-10-04: qty 50

## 2024-10-04 MED ADMIN — ORAL CARE MOUTH RINSE: 15 mL | OROMUCOSAL | NDC 99999080097

## 2024-10-04 MED ADMIN — Acetaminophen Tab 500 MG: 1000 mg | NDC 50580045711

## 2024-10-04 MED ADMIN — Chlorhexidine Gluconate Pads 2%: 6 | TOPICAL | NDC 53462070523

## 2024-10-04 MED ADMIN — Docusate Sodium Liquid 150 MG/15ML: 100 mg | NDC 00904727966

## 2024-10-04 MED ADMIN — Heparin Sodium (Porcine) Inj 1000 Unit/ML: 3000 [IU] | INTRAVENOUS_CENTRAL | NDC 71288040201

## 2024-10-04 MED ADMIN — B-Complex w/ C & Folic Acid Tab 0.8 MG: 1 | NDC 60258016001

## 2024-10-04 MED ADMIN — Sodium Zirconium Cyclosilicate For Susp Packet 10 GM: 10 g | NDC 00310111039

## 2024-10-04 MED ADMIN — Sodium Zirconium Cyclosilicate For Susp Packet 10 GM: 10 g | NDC 00310111001

## 2024-10-04 MED ADMIN — Protein Liquid (Enteral): 60 mL | NDC 9468801446

## 2024-10-04 MED ADMIN — Insulin Aspart Inj Soln 100 Unit/ML: 3 [IU] | SUBCUTANEOUS | NDC 73070010011

## 2024-10-04 MED ADMIN — Insulin Aspart Inj Soln 100 Unit/ML: 2 [IU] | SUBCUTANEOUS | NDC 73070010011

## 2024-10-04 MED ADMIN — Pantoprazole Sodium For IV Soln 40 MG (Base Equiv): 40 mg | INTRAVENOUS | NDC 00008092351

## 2024-10-04 MED ADMIN — Thiamine Mononitrate Tab 100 MG: 100 mg | NDC 54629005701

## 2024-10-04 MED ADMIN — Hydralazine HCl Inj 20 MG/ML: 10 mg | INTRAVENOUS | NDC 72572026501

## 2024-10-04 MED ADMIN — Polyethylene Glycol 3350 Oral Packet 17 GM: 17 g | NDC 00904693186

## 2024-10-04 MED ADMIN — Hydromorphone HCl Inj 1 MG/ML: 1 mg | INTRAVENOUS | NDC 00409128303

## 2024-10-04 MED ADMIN — Gadobutrol Inj 1 MMOL/ML (604.72 MG/ML): 9 mL | INTRAVENOUS | NDC 50419032512
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
## 2024-10-04 NOTE — Progress Notes (Signed)
 RT transported pt on ventilator from 4N31 to MRI and back to 4N31 without any issues. RN x2 at bedside.

## 2024-10-04 NOTE — Procedures (Signed)
 Patient Name: Adal Sereno  MRN: [id number]  Epilepsy Attending: Arlin MALVA Krebs  Referring Physician/Provider: Nichola Zeke HERO, MD  Duration: 10/03/2024 1312 to 10/04/2024 0630  Patient history: 68yo M with ams. EEG to evaluate for seizure  Level of alertness: Awake/ lethargic  AEDs during EEG study: None  Technical aspects: This EEG study was done with scalp electrodes positioned according to the 10-20 International system of electrode placement. Electrical activity was reviewed with band pass filter of 1-70Hz , sensitivity of 7 uV/mm, display speed of 49mm/sec with a 60Hz  notched filter applied as appropriate. EEG data were recorded continuously and digitally stored.  Video monitoring was available and reviewed as appropriate.  Description: EEG showed continuous generalized 3 to 6 Hz theta-delta slowing. Generalized periodic discharges with triphasic morphology at 1.5-2 Hz were also noted, at times with overlying rhythmicity as well as faster activity. Hyperventilation and photic stimulation were not performed.     ABNORMALITY - Periodic discharges with triphasic morphology, generalized ( GPDs) - Continuous slow, generalized  IMPRESSION: This study showed generalized periodic discharges which given the morphology and frequency, are most likely suggestive of toxic-metabolic causes like cefepime  toxicity in a patient with renal dysfunction. However, at times there is overlying rhythmicity and therefore, cannot completely rule out epileptiform nature. Can continue monitoring to watch for clinical and EEG improvement if needed.     Additionally there is generalized cerebral dysfunction ( encephalopathy). No definite seizures were seen throughout the recording.  Sigourney Portillo O Alydia Gosser
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
## 2024-10-04 NOTE — Progress Notes (Signed)
   ORTHOPAEDIC PROGRESS NOTE  s/p Procedure(s): LEFT FEMUR INTRAMEDULLARY ROD, RETROGRADE on 11/28 with Dr. Beverley  SUBJECTIVE: Sedated on vent  OBJECTIVE: PE: Bilateral BKAs LLE: incisions CDI.   Vitals:   10/04/24 0700 10/04/24 0800  BP: 126/72 128/75  Pulse: 71 71  Resp: 20 20  Temp:    SpO2: 100% 100%    Opiates Today (MME): Today's  total administered Morphine  Milligram Equivalents: 0 Opiates Yesterday (MME): Yesterday's total administered Morphine  Milligram Equivalents: 207 Opiates Used in the last two days:  Inpatient Morphine  Milligram Equivalents Per Day 11/26 - 11/30   Values displayed are in units of MME/Day    Order Start / End Date 11/26 11/27 11/28  Yesterday Today    fentaNYL  (SUBLIMAZE ) injection 100 mcg 11/26 - 11/26 30 of 30 -- -- -- --    HYDROmorphone  (DILAUDID ) injection 2 mg 11/26 - 11/26 40 of 40 -- -- -- --    oxyCODONE  (Oxy IR/ROXICODONE ) immediate release tablet 5 mg 11/27 - 11/27 -- 0 of 30 -- -- --    HYDROmorphone  (DILAUDID ) injection 1 mg 11/27 - No end date -- 0 of 240 0 of 240 0 of 240 0 of 240    fentaNYL  (SUBLIMAZE ) injection 25-50 mcg 11/27 - 11/29 -- 0 of 7.5-15 -- -- --    fentaNYL  in NS (17mcg/ml) infusion-PREMIX 11/27 - No end date -- 77.75 of 0-1,170 246.5 of 0-2,880 199.5 of 0-2,880 0 of 0-2,880    fentaNYL  (SUBLIMAZE ) bolus via infusion 25-100 mcg 11/27 - No end date -- 0 of 322.5-1,290 0 of 720-2,880 0 of 720-2,880 0 of 720-2,880    oxyCODONE  (Oxy IR/ROXICODONE ) immediate release tablet 5 mg 11/27 - No end date -- 0 of 22.5 7.5 of 45 7.5 of 45 0 of 45    Daily Totals  70 of 70 77.75 of 622.5-2,767.5 254 of 1,005-6,045 207 of 1,005-6,045 0 of 1,005-6,045       ASSESSMENT: Scott Greer is a 68 y.o. male POD#2  PLAN: Weightbearing: Mobilize as tolerated. Wheelchair bound.  Insicional and dressing care: Reinforce dressings as needed Orthopedic device(s): None Showering: Hold VTE prophylaxis: per primary  team Pain control: PRN Follow - up plan: TBD   Left humerus fracture - non-operative management. NWB LUE in sling.  Bilateral pubic rami fracture - nonoperative management. WBAT  Contact information:  After hours and holidays please check [email or web address] for group call information for [facility]  Aleck Stalling, PA-C 10/04/24
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
## 2024-10-04 NOTE — Progress Notes (Signed)
 LTM EEG disconnected - no skin breakdown at [facility]. [facility] notified.

## 2024-10-05 ENCOUNTER — Inpatient Hospital Stay (HOSPITAL_COMMUNITY)

## 2024-10-05 DIAGNOSIS — F1210 Cannabis abuse, uncomplicated: Secondary | ICD-10-CM

## 2024-10-05 DIAGNOSIS — N179 Acute kidney failure, unspecified: Secondary | ICD-10-CM

## 2024-10-05 DIAGNOSIS — E7220 Disorder of urea cycle metabolism, unspecified: Secondary | ICD-10-CM

## 2024-10-05 DIAGNOSIS — J9601 Acute respiratory failure with hypoxia: Secondary | ICD-10-CM

## 2024-10-05 DIAGNOSIS — I6381 Other cerebral infarction due to occlusion or stenosis of small artery: Secondary | ICD-10-CM | POA: Diagnosis not present

## 2024-10-05 DIAGNOSIS — S069XAA Unspecified intracranial injury with loss of consciousness status unknown, initial encounter: Secondary | ICD-10-CM | POA: Diagnosis not present

## 2024-10-05 DIAGNOSIS — I130 Hypertensive heart and chronic kidney disease with heart failure and stage 1 through stage 4 chronic kidney disease, or unspecified chronic kidney disease: Secondary | ICD-10-CM

## 2024-10-05 DIAGNOSIS — N1832 Chronic kidney disease, stage 3b: Secondary | ICD-10-CM | POA: Diagnosis not present

## 2024-10-05 DIAGNOSIS — F1410 Cocaine abuse, uncomplicated: Secondary | ICD-10-CM

## 2024-10-05 DIAGNOSIS — F17290 Nicotine dependence, other tobacco product, uncomplicated: Secondary | ICD-10-CM | POA: Diagnosis not present

## 2024-10-05 DIAGNOSIS — D638 Anemia in other chronic diseases classified elsewhere: Secondary | ICD-10-CM

## 2024-10-05 DIAGNOSIS — J939 Pneumothorax, unspecified: Secondary | ICD-10-CM | POA: Diagnosis not present

## 2024-10-05 DIAGNOSIS — E1122 Type 2 diabetes mellitus with diabetic chronic kidney disease: Secondary | ICD-10-CM | POA: Diagnosis not present

## 2024-10-05 DIAGNOSIS — J690 Pneumonitis due to inhalation of food and vomit: Secondary | ICD-10-CM | POA: Diagnosis not present

## 2024-10-05 DIAGNOSIS — I69391 Dysphagia following cerebral infarction: Secondary | ICD-10-CM | POA: Diagnosis not present

## 2024-10-05 DIAGNOSIS — I5032 Chronic diastolic (congestive) heart failure: Secondary | ICD-10-CM | POA: Diagnosis not present

## 2024-10-05 DIAGNOSIS — S27322A Contusion of lung, bilateral, initial encounter: Secondary | ICD-10-CM | POA: Diagnosis not present

## 2024-10-05 DIAGNOSIS — I5033 Acute on chronic diastolic (congestive) heart failure: Secondary | ICD-10-CM | POA: Diagnosis not present

## 2024-10-05 LAB — ECHOCARDIOGRAM COMPLETE
AR max vel: 3.12 cm2
AV Area VTI: 3.26 cm2
AV Area mean vel: 3.12 cm2
AV Mean grad: 5 mmHg
AV Peak grad: 8.5 mmHg
Ao pk vel: 1.46 m/s
Area-P 1/2: 3.61 cm2
Height: 72 in
S' Lateral: 4 cm
Weight: 3142.88 [oz_av]

## 2024-10-05 LAB — TYPE AND SCREEN
ABO/RH(D): A POS
Antibody Screen: NEGATIVE
Unit division: 0
Unit division: 0

## 2024-10-05 LAB — RENAL FUNCTION PANEL
Albumin: 2.7 g/dL — ABNORMAL LOW (ref 3.5–5.0)
Albumin: 2.5 g/dL — ABNORMAL LOW (ref 3.5–5.0)
Albumin: 2.1 g/dL — ABNORMAL LOW (ref 3.5–5.0)
Anion gap: 14 (ref 5–15)
Anion gap: 12 (ref 5–15)
Anion gap: 13 (ref 5–15)
BUN: 54 mg/dL — ABNORMAL HIGH (ref 8–23)
BUN: 52 mg/dL — ABNORMAL HIGH (ref 8–23)
BUN: 47 mg/dL — ABNORMAL HIGH (ref 8–23)
CO2: 27 mmol/L (ref 22–32)
CO2: 28 mmol/L (ref 22–32)
CO2: 23 mmol/L (ref 22–32)
Calcium: 7.8 mg/dL — ABNORMAL LOW (ref 8.9–10.3)
Calcium: 7.7 mg/dL — ABNORMAL LOW (ref 8.9–10.3)
Calcium: 7.5 mg/dL — ABNORMAL LOW (ref 8.9–10.3)
Chloride: 97 mmol/L — ABNORMAL LOW (ref 98–111)
Chloride: 98 mmol/L (ref 98–111)
Chloride: 103 mmol/L (ref 98–111)
Creatinine, Ser: 2.74 mg/dL — ABNORMAL HIGH (ref 0.61–1.24)
Creatinine, Ser: 2.61 mg/dL — ABNORMAL HIGH (ref 0.61–1.24)
Creatinine, Ser: 2.23 mg/dL — ABNORMAL HIGH (ref 0.61–1.24)
GFR, Estimated: 25 mL/min — ABNORMAL LOW
GFR, Estimated: 26 mL/min — ABNORMAL LOW
GFR, Estimated: 32 mL/min — ABNORMAL LOW
Glucose, Bld: 214 mg/dL — ABNORMAL HIGH (ref 70–99)
Glucose, Bld: 266 mg/dL — ABNORMAL HIGH (ref 70–99)
Glucose, Bld: 134 mg/dL — ABNORMAL HIGH (ref 70–99)
Phosphorus: 3.6 mg/dL (ref 2.5–4.6)
Phosphorus: 3.2 mg/dL (ref 2.5–4.6)
Phosphorus: 3.3 mg/dL (ref 2.5–4.6)
Potassium: 3.6 mmol/L (ref 3.5–5.1)
Potassium: 3.4 mmol/L — ABNORMAL LOW (ref 3.5–5.1)
Potassium: 4.6 mmol/L (ref 3.5–5.1)
Sodium: 138 mmol/L (ref 135–145)
Sodium: 138 mmol/L (ref 135–145)
Sodium: 139 mmol/L (ref 135–145)

## 2024-10-05 LAB — LIPID PANEL
Cholesterol: 119 mg/dL (ref 0–200)
HDL: 36 mg/dL — ABNORMAL LOW
LDL Cholesterol: 53 mg/dL (ref 0–99)
Total CHOL/HDL Ratio: 3.3 ratio
Triglycerides: 151 mg/dL — ABNORMAL HIGH
VLDL: 30 mg/dL (ref 0–40)

## 2024-10-05 LAB — CBC
HCT: 27.9 % — ABNORMAL LOW (ref 39.0–52.0)
Hemoglobin: 9.4 g/dL — ABNORMAL LOW (ref 13.0–17.0)
MCH: 27.7 pg (ref 26.0–34.0)
MCHC: 33.7 g/dL (ref 30.0–36.0)
MCV: 82.3 fL (ref 80.0–100.0)
Platelets: 169 K/uL (ref 150–400)
RBC: 3.39 MIL/uL — ABNORMAL LOW (ref 4.22–5.81)
RDW: 15.2 % (ref 11.5–15.5)
WBC: 9.7 K/uL (ref 4.0–10.5)
nRBC: 0.5 % — ABNORMAL HIGH (ref 0.0–0.2)

## 2024-10-05 LAB — GLUCOSE, CAPILLARY
Glucose-Capillary: 126 mg/dL — ABNORMAL HIGH (ref 70–99)
Glucose-Capillary: 151 mg/dL — ABNORMAL HIGH (ref 70–99)
Glucose-Capillary: 161 mg/dL — ABNORMAL HIGH (ref 70–99)
Glucose-Capillary: 169 mg/dL — ABNORMAL HIGH (ref 70–99)
Glucose-Capillary: 185 mg/dL — ABNORMAL HIGH (ref 70–99)
Glucose-Capillary: 252 mg/dL — ABNORMAL HIGH (ref 70–99)

## 2024-10-05 LAB — BPAM RBC
Blood Product Expiration Date: 202512212359
Blood Product Expiration Date: 202512222359
ISSUE DATE / TIME: 202511301213
ISSUE DATE / TIME: 202511301736
Unit Type and Rh: 6200
Unit Type and Rh: 6200

## 2024-10-05 LAB — AMMONIA: Ammonia: 40 umol/L — ABNORMAL HIGH (ref 9–35)

## 2024-10-05 LAB — MAGNESIUM
Magnesium: 2.2 mg/dL (ref 1.7–2.4)
Magnesium: 2.2 mg/dL (ref 1.7–2.4)

## 2024-10-05 LAB — HEMOGLOBIN AND HEMATOCRIT, BLOOD
HCT: 29.9 % — ABNORMAL LOW (ref 39.0–52.0)
Hemoglobin: 10.1 g/dL — ABNORMAL LOW (ref 13.0–17.0)

## 2024-10-05 LAB — TRIGLYCERIDES: Triglycerides: 166 mg/dL — ABNORMAL HIGH

## 2024-10-05 LAB — APTT: aPTT: 30 s (ref 24–36)

## 2024-10-05 MED ORDER — FENTANYL CITRATE (PF) 50 MCG/ML IJ SOSY
50.0000 ug | PREFILLED_SYRINGE | INTRAMUSCULAR | Status: DC | PRN
  Administered 2024-10-05: 50 ug via INTRAVENOUS
  Administered 2024-10-06: 100 ug via INTRAVENOUS
  Filled 2024-10-05: qty 2
  Filled 2024-10-05: qty 1

## 2024-10-05 MED ORDER — POTASSIUM CHLORIDE CRYS ER 20 MEQ PO TBCR
20.0000 meq | EXTENDED_RELEASE_TABLET | Freq: Once | ORAL | Status: AC
  Administered 2024-10-05: 20 meq via ORAL
  Filled 2024-10-05: qty 1

## 2024-10-05 MED ORDER — FENTANYL CITRATE (PF) 50 MCG/ML IJ SOSY
50.0000 ug | PREFILLED_SYRINGE | INTRAMUSCULAR | Status: DC | PRN
  Administered 2024-10-06: 50 ug via INTRAVENOUS
  Filled 2024-10-05: qty 1

## 2024-10-05 MED ORDER — LORAZEPAM 2 MG/ML IJ SOLN: 2.0000 mg | Freq: Once | INTRAMUSCULAR | Status: AC

## 2024-10-05 MED ORDER — FENTANYL CITRATE (PF) 50 MCG/ML IJ SOSY
100.0000 ug | PREFILLED_SYRINGE | Freq: Once | INTRAMUSCULAR | Status: AC
  Administered 2024-10-05: 100 ug via INTRAVENOUS

## 2024-10-05 MED ORDER — LACTULOSE 10 GM/15ML PO SOLN
30.0000 g | Freq: Once | ORAL | Status: AC
  Administered 2024-10-05: 30 g
  Filled 2024-10-05: qty 45

## 2024-10-05 MED ADMIN — ORAL CARE MOUTH RINSE: 15 mL | OROMUCOSAL | NDC 99999080097

## 2024-10-05 MED ADMIN — Insulin Aspart Inj Soln 100 Unit/ML: 3 [IU] | SUBCUTANEOUS | NDC 73070010011

## 2024-10-05 MED ADMIN — Acetaminophen Tab 500 MG: 1000 mg | NDC 50580045711

## 2024-10-05 MED ADMIN — Chlorhexidine Gluconate Pads 2%: 6 | TOPICAL | NDC 53462070523

## 2024-10-05 MED ADMIN — Insulin Aspart Inj Soln 100 Unit/ML: 1 [IU] | SUBCUTANEOUS | NDC 73070010011

## 2024-10-05 MED ADMIN — Insulin Aspart Inj Soln 100 Unit/ML: 4 [IU] | SUBCUTANEOUS | NDC 73070010011

## 2024-10-05 MED ADMIN — Oxycodone HCl Tab 5 MG: 5 mg | NDC 00406055223

## 2024-10-05 MED ADMIN — Polyethylene Glycol 3350 Oral Packet 17 GM: 17 g | NDC 00904693186

## 2024-10-05 MED ADMIN — Heparin Sodium (Porcine) Inj 1000 Unit/ML: 1000 [IU] | INTRAVENOUS_CENTRAL | NDC 71288040201

## 2024-10-05 MED ADMIN — B-Complex w/ C & Folic Acid Tab 0.8 MG: 1 | NDC 60258016001

## 2024-10-05 MED ADMIN — Protein Liquid (Enteral): 60 mL | NDC 9468801446

## 2024-10-05 MED ADMIN — Lorazepam Inj 2 MG/ML: 2 mg | INTRAVENOUS | NDC 65219036801

## 2024-10-05 MED ADMIN — Amlodipine Besylate Tab 10 MG (Base Equivalent): 10 mg | NDC 00904637161

## 2024-10-05 MED ADMIN — AMPICILLIN-SULBACTAM 3 GM IVPB: 3 g | INTRAVENOUS | NDC 55150011720

## 2024-10-05 MED ADMIN — Insulin Aspart Inj Soln 100 Unit/ML: 8 [IU] | SUBCUTANEOUS | NDC 73070010011

## 2024-10-05 MED ADMIN — Sennosides Tab 8.6 MG: 8.6 mg | NDC 00904725280

## 2024-10-05 MED ADMIN — Polyvinyl Alcohol Ophth Soln 1.4%: 1 [drp] | OPHTHALMIC | NDC 57896018105

## 2024-10-05 MED ADMIN — Polyvinyl Alcohol Ophth Soln 1.4%: 1 [drp] | OPHTHALMIC | NDC 00536140894

## 2024-10-05 MED ADMIN — Pantoprazole Sodium For IV Soln 40 MG (Base Equiv): 40 mg | INTRAVENOUS | NDC 00008092351

## 2024-10-05 MED ADMIN — Thiamine Mononitrate Tab 100 MG: 100 mg | NDC 54629005701

## 2024-10-05 MED ADMIN — Hydralazine HCl Inj 20 MG/ML: 10 mg | INTRAVENOUS | NDC 72572026501

## 2024-10-05 MED ADMIN — Ondansetron HCl Inj 4 MG/2ML (2 MG/ML): 4 mg | INTRAVENOUS | NDC 00409475518

## 2024-10-05 MED ADMIN — Hydromorphone HCl Inj 1 MG/ML: 1 mg | INTRAVENOUS | NDC 00409128303

## 2024-10-05 MED ADMIN — Iohexol IV Soln 350 MG/ML: 75 mL | INTRAVENOUS | NDC 00407141490

## 2024-10-05 MED FILL — Amlodipine Besylate Tab 10 MG (Base Equivalent): 10.0000 mg | ORAL | Qty: 1 | Status: AC

## 2024-10-05 MED FILL — Insulin Aspart Inj Soln 100 Unit/ML: 3.0000 [IU] | INTRAMUSCULAR | Qty: 3 | Status: AC

## 2024-10-05 MED FILL — Insulin Aspart Inj Soln 100 Unit/ML: 0.0000 [IU] | INTRAMUSCULAR | Qty: 3 | Status: AC

## 2024-10-05 MED FILL — Insulin Aspart Inj Soln 100 Unit/ML: 0.0000 [IU] | INTRAMUSCULAR | Qty: 4 | Status: AC

## 2024-10-05 MED FILL — Insulin Aspart Inj Soln 100 Unit/ML: 0.0000 [IU] | INTRAMUSCULAR | Qty: 7 | Status: AC

## 2024-10-05 MED FILL — Insulin Aspart Inj Soln 100 Unit/ML: 0.0000 [IU] | INTRAMUSCULAR | Qty: 1 | Status: AC

## 2024-10-05 MED FILL — Insulin Aspart Inj Soln 100 Unit/ML: 0.0000 [IU] | INTRAMUSCULAR | Qty: 5 | Status: AC

## 2024-10-05 MED FILL — Insulin Aspart Inj Soln 100 Unit/ML: 0.0000 [IU] | INTRAMUSCULAR | Qty: 10 | Status: AC

## 2024-10-05 MED FILL — Insulin Aspart Inj Soln 100 Unit/ML: 0.0000 [IU] | INTRAMUSCULAR | Qty: 6 | Status: AC

## 2024-10-05 MED FILL — Polyethylene Glycol 3350 Oral Packet 17 GM: 17.0000 g | ORAL | Qty: 1 | Status: AC

## 2024-10-05 MED FILL — Polyethylene Glycol 3350 Oral Packet 17 GM: 17.0000 g | ORAL | Qty: 1 | Status: CN

## 2024-10-05 MED FILL — Sennosides Tab 8.6 MG: 1.0000 | ORAL | Qty: 1 | Status: AC

## 2024-10-05 MED FILL — Ampicillin & Sulbactam Sodium For Inj 3 (2-1) GM: 3.0000 g | INTRAMUSCULAR | Qty: 8 | Status: AC

## 2024-10-05 MED FILL — Protein Liquid (Enteral): 60.0000 mL | ENTERAL | Qty: 60 | Status: AC

## 2024-10-05 MED FILL — Lorazepam Inj 2 MG/ML: 2.0000 mg | INTRAMUSCULAR | Qty: 1 | Status: AC

## 2024-10-05 MED FILL — Polyvinyl Alcohol Ophth Soln 1.4%: 1.0000 [drp] | OPHTHALMIC | Qty: 15 | Status: AC

## 2024-10-05 MED FILL — Fentanyl Citrate Preservative Free (PF) Inj 100 MCG/2ML: INTRAMUSCULAR | Qty: 2 | Status: AC
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
## 2024-10-05 NOTE — Anesthesia Postprocedure Evaluation (Signed)
 Anesthesia Post Note  Patient: Scott Greer  Procedure(s) Performed: LEFT FEMUR INTRAMEDULLARY ROD, RETROGRADE (Left: Leg Upper)     Patient location during evaluation: SICU Anesthesia Type: General Level of consciousness: sedated Pain management: pain level controlled Vital Signs Assessment: post-procedure vital signs reviewed and stable Respiratory status: patient remains intubated per anesthesia plan Cardiovascular status: stable Postop Assessment: no apparent nausea or vomiting Anesthetic complications: no   No notable events documented.                  Scott Greer,W. EDMOND
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
## 2024-10-05 NOTE — Progress Notes (Signed)
 Orthopedic Tech Progress Note Patient Details:  Scott Greer 01/28/1956 [id number]  Ortho Devices Type of Ortho Device: Arm sling Ortho Device/Splint Location: RUE Ortho Device/Splint Interventions: Ordered      Scott Greer 10/05/2024, 5:32 PM

## 2024-10-05 NOTE — Progress Notes (Signed)
    Subjective: Patient remains intubated and sedated.   Objective:   VITALS:   Vitals:   10/05/24 0600 10/05/24 0700 10/05/24 0730 10/05/24 0800  BP: 111/60 121/63 (!) 141/70 (!) 154/69  Pulse: 72 77 78 91  Resp: 20 20 18 19   Temp:    98.9 F (37.2 C)  TempSrc:    Axillary  SpO2: 98% 98% 99% 98%  Weight:      Height:          Latest Ref Rng & Units 10/05/2024    4:13 AM 10/04/2024   11:43 PM 10/04/2024    4:55 PM  CBC  WBC 4.0 - 10.5 K/uL 9.7     Hemoglobin 13.0 - 17.0 g/dL 9.4  89.8  7.3   Hematocrit 39.0 - 52.0 % 27.9  29.9  22.4   Platelets 150 - 400 K/uL 169         Latest Ref Rng & Units 10/05/2024    4:13 AM 10/04/2024   11:43 PM 10/04/2024    3:14 PM  BMP  Glucose 70 - 99 mg/dL 733  785  821   BUN 8 - 23 mg/dL 52  54  58   Creatinine 0.61 - 1.24 mg/dL 7.38  7.25  6.83   Sodium 135 - 145 mmol/L 138  138  138   Potassium 3.5 - 5.1 mmol/L 3.4  3.6  3.6   Chloride 98 - 111 mmol/L 98  97  97   CO2 22 - 32 mmol/L 28  27  28    Calcium  8.9 - 10.3 mg/dL 7.7  7.8  7.4    Intake/Output      11/30 0701 12/01 0700 12/01 0701 12/02 0700   I.V. (mL/kg) 46.3 (0.5)    Blood 315    Other     NG/GT 1680 100   IV Piggyback 41.7    Total Intake(mL/kg) 2083 (23.4) 100 (1.1)   Urine (mL/kg/hr) 850 (0.4) 125 (0.9)   Emesis/NG output     CRRT 2699.9 150   Total Output 3549.9 275   Net -1466.9 -175           Physical Exam: General: NAD. MSK B/L BKA Neurovascularly intact Incision: dressings C/D/I No sling   Assessment: 3 Days Post-Op  S/P Procedure(s) (LRB): LEFT FEMUR INTRAMEDULLARY ROD, RETROGRADE (Left) by Dr. Evalene BIRCH. Murphy on 10/02/24  Principal Problem:   Trauma   Plan: Left humerus fracture - non-operative management. NWB LUE in sling PRN.  Bilateral pubic rami fracture - nonoperative management   Weightbearing: WC bound but ok to mobilize once off vent Insicional and dressing care: Reinforce dressings as needed Orthopedic device(s):  sling Showering: Keep dressing dry Pain control: PRN   Dispo: TBD     Gerard CHRISTELLA Large, PA-C Office [phone] 10/05/2024, 8:36 AM

## 2024-10-05 NOTE — Progress Notes (Signed)
 Pt transported to and from CT scan on the ventilator without incident.

## 2024-10-06 ENCOUNTER — Inpatient Hospital Stay (HOSPITAL_COMMUNITY)

## 2024-10-06 ENCOUNTER — Encounter (HOSPITAL_COMMUNITY): Payer: Self-pay | Admitting: Orthopedic Surgery

## 2024-10-06 DIAGNOSIS — J9601 Acute respiratory failure with hypoxia: Secondary | ICD-10-CM | POA: Diagnosis not present

## 2024-10-06 DIAGNOSIS — E7220 Disorder of urea cycle metabolism, unspecified: Secondary | ICD-10-CM | POA: Diagnosis not present

## 2024-10-06 DIAGNOSIS — F1410 Cocaine abuse, uncomplicated: Secondary | ICD-10-CM | POA: Diagnosis not present

## 2024-10-06 DIAGNOSIS — F17290 Nicotine dependence, other tobacco product, uncomplicated: Secondary | ICD-10-CM | POA: Diagnosis not present

## 2024-10-06 DIAGNOSIS — I5033 Acute on chronic diastolic (congestive) heart failure: Secondary | ICD-10-CM | POA: Diagnosis not present

## 2024-10-06 DIAGNOSIS — F1210 Cannabis abuse, uncomplicated: Secondary | ICD-10-CM | POA: Diagnosis not present

## 2024-10-06 DIAGNOSIS — J690 Pneumonitis due to inhalation of food and vomit: Secondary | ICD-10-CM | POA: Diagnosis not present

## 2024-10-06 DIAGNOSIS — I6381 Other cerebral infarction due to occlusion or stenosis of small artery: Secondary | ICD-10-CM | POA: Diagnosis not present

## 2024-10-06 DIAGNOSIS — I69391 Dysphagia following cerebral infarction: Secondary | ICD-10-CM | POA: Diagnosis not present

## 2024-10-06 DIAGNOSIS — I5032 Chronic diastolic (congestive) heart failure: Secondary | ICD-10-CM | POA: Diagnosis not present

## 2024-10-06 DIAGNOSIS — N179 Acute kidney failure, unspecified: Secondary | ICD-10-CM | POA: Diagnosis not present

## 2024-10-06 DIAGNOSIS — N1832 Chronic kidney disease, stage 3b: Secondary | ICD-10-CM | POA: Diagnosis not present

## 2024-10-06 DIAGNOSIS — J939 Pneumothorax, unspecified: Secondary | ICD-10-CM | POA: Diagnosis not present

## 2024-10-06 DIAGNOSIS — S27322A Contusion of lung, bilateral, initial encounter: Secondary | ICD-10-CM | POA: Diagnosis not present

## 2024-10-06 DIAGNOSIS — I130 Hypertensive heart and chronic kidney disease with heart failure and stage 1 through stage 4 chronic kidney disease, or unspecified chronic kidney disease: Secondary | ICD-10-CM | POA: Diagnosis not present

## 2024-10-06 DIAGNOSIS — D638 Anemia in other chronic diseases classified elsewhere: Secondary | ICD-10-CM | POA: Diagnosis not present

## 2024-10-06 DIAGNOSIS — S069XAA Unspecified intracranial injury with loss of consciousness status unknown, initial encounter: Secondary | ICD-10-CM | POA: Diagnosis not present

## 2024-10-06 DIAGNOSIS — E1122 Type 2 diabetes mellitus with diabetic chronic kidney disease: Secondary | ICD-10-CM | POA: Diagnosis not present

## 2024-10-06 LAB — CBC
HCT: 27.8 % — ABNORMAL LOW (ref 39.0–52.0)
Hemoglobin: 9 g/dL — ABNORMAL LOW (ref 13.0–17.0)
MCH: 27.3 pg (ref 26.0–34.0)
MCHC: 32.4 g/dL (ref 30.0–36.0)
MCV: 84.2 fL (ref 80.0–100.0)
Platelets: 175 K/uL (ref 150–400)
RBC: 3.3 MIL/uL — ABNORMAL LOW (ref 4.22–5.81)
RDW: 15.7 % — ABNORMAL HIGH (ref 11.5–15.5)
WBC: 9.3 K/uL (ref 4.0–10.5)
nRBC: 0.2 % (ref 0.0–0.2)

## 2024-10-06 LAB — CULTURE, RESPIRATORY W GRAM STAIN: Culture: NO GROWTH

## 2024-10-06 LAB — RENAL FUNCTION PANEL
Albumin: 2.3 g/dL — ABNORMAL LOW (ref 3.5–5.0)
Anion gap: 9 (ref 5–15)
BUN: 44 mg/dL — ABNORMAL HIGH (ref 8–23)
CO2: 30 mmol/L (ref 22–32)
Calcium: 8 mg/dL — ABNORMAL LOW (ref 8.9–10.3)
Chloride: 102 mmol/L (ref 98–111)
Creatinine, Ser: 2.08 mg/dL — ABNORMAL HIGH (ref 0.61–1.24)
GFR, Estimated: 34 mL/min — ABNORMAL LOW
Glucose, Bld: 165 mg/dL — ABNORMAL HIGH (ref 70–99)
Phosphorus: 2.6 mg/dL (ref 2.5–4.6)
Potassium: 4 mmol/L (ref 3.5–5.1)
Sodium: 141 mmol/L (ref 135–145)

## 2024-10-06 LAB — GLUCOSE, CAPILLARY
Glucose-Capillary: 111 mg/dL — ABNORMAL HIGH (ref 70–99)
Glucose-Capillary: 123 mg/dL — ABNORMAL HIGH (ref 70–99)
Glucose-Capillary: 128 mg/dL — ABNORMAL HIGH (ref 70–99)
Glucose-Capillary: 144 mg/dL — ABNORMAL HIGH (ref 70–99)
Glucose-Capillary: 156 mg/dL — ABNORMAL HIGH (ref 70–99)
Glucose-Capillary: 181 mg/dL — ABNORMAL HIGH (ref 70–99)

## 2024-10-06 LAB — APTT: aPTT: 30 s (ref 24–36)

## 2024-10-06 LAB — MAGNESIUM: Magnesium: 2.4 mg/dL (ref 1.7–2.4)

## 2024-10-06 MED ORDER — FENTANYL CITRATE (PF) 50 MCG/ML IJ SOSY
12.5000 ug | PREFILLED_SYRINGE | Freq: Once | INTRAMUSCULAR | Status: AC
  Administered 2024-10-06: 12.5 ug via INTRAVENOUS
  Filled 2024-10-06: qty 1

## 2024-10-06 MED ORDER — ASPIRIN 300 MG RE SUPP
300.0000 mg | Freq: Every day | RECTAL | Status: DC
  Administered 2024-10-06: 300 mg via RECTAL
  Filled 2024-10-06: qty 1

## 2024-10-06 MED ORDER — ASPIRIN 81 MG PO CHEW
81.0000 mg | CHEWABLE_TABLET | Freq: Every day | ORAL | Status: DC
  Administered 2024-10-07: 81 mg via ORAL
  Filled 2024-10-06: qty 1

## 2024-10-06 MED ADMIN — ORAL CARE MOUTH RINSE: 15 mL | OROMUCOSAL | NDC 99999080097

## 2024-10-06 MED ADMIN — Insulin Aspart Inj Soln 100 Unit/ML: 3 [IU] | SUBCUTANEOUS | NDC 73070010011

## 2024-10-06 MED ADMIN — Acetaminophen Tab 500 MG: 1000 mg | NDC 50580045711

## 2024-10-06 MED ADMIN — Chlorhexidine Gluconate Pads 2%: 6 | TOPICAL | NDC 53462070523

## 2024-10-06 MED ADMIN — Insulin Aspart Inj Soln 100 Unit/ML: 4 [IU] | SUBCUTANEOUS | NDC 73070010011

## 2024-10-06 MED ADMIN — Oxycodone HCl Tab 5 MG: 5 mg | NDC 00406055223

## 2024-10-06 MED ADMIN — AMPICILLIN-SULBACTAM 3 GM IVPB: 3 g | INTRAVENOUS | NDC 55150011720

## 2024-10-06 MED ADMIN — Pantoprazole Sodium For IV Soln 40 MG (Base Equiv): 40 mg | INTRAVENOUS | NDC 00008092351

## 2024-10-06 MED ADMIN — Hydralazine HCl Inj 20 MG/ML: 10 mg | INTRAVENOUS | NDC 72572026501

## 2024-10-06 MED ADMIN — Metoprolol Tartrate IV Soln 5 MG/5ML: 5 mg | INTRAVENOUS | NDC 00409177815

## 2024-10-06 MED ADMIN — Labetalol HCl IV Soln 5 MG/ML: 20 mg | INTRAVENOUS | NDC 00409233924

## 2024-10-06 MED FILL — Pantoprazole Sodium For IV Soln 40 MG (Base Equiv): 40.0000 mg | INTRAVENOUS | Qty: 10 | Status: AC

## 2024-10-06 MED FILL — Morphine Sulfate IV Soln PF 2 MG/ML: 2.0000 mg | INTRAVENOUS | Qty: 1 | Status: AC

## 2024-10-06 MED FILL — Hydralazine HCl Inj 20 MG/ML: INTRAMUSCULAR | Qty: 1 | Status: AC

## 2024-10-06 MED FILL — Labetalol HCl IV Soln 5 MG/ML: 10.0000 mg | INTRAVENOUS | Qty: 4 | Status: AC

## 2024-10-06 MED FILL — Heparin Sodium (Porcine) Inj 1000 Unit/ML: INTRAMUSCULAR | Qty: 3 | Status: AC

## 2024-10-06 NOTE — Progress Notes (Signed)
 Patient ID: Scott Greer, male   DOB: 1956-03-05, 68 y.o.   MRN: [id number] [facility] Progress Note   Assessment/ Plan:    1.  Acute kidney injury on chronic kidney disease stage IIIb:  - Likely ischemic ATN in the setting of multifocal trauma/acute blood loss anemia and relative hypotension without significant impact from rhabdomyolysis.  Started on CRRT on 10/02/2024 with worsening oliguria and metabolic abnormalities including hyperkalemia.  CRRT paused for MRI on 11/30. Note hx of diastolic CHF, HTN, polysubstance abuse.  He did receive a CTA on 12/1 as well.  - Continue CRRT until cartridge expires or circuit clots.  Reached out to critical care re: same.  Would discontinue CRRT by the end of day shift today.  Then will plan for transition to IHD as needed - 4K fluids   - UF goal net negative 50 ml/hr    2.  Hypokalemia: note recently with hyperkalemia secondary to acute kidney injury along with tissue injury.  This was corrected with CRRT.  More recently with hypokalemia which improved after supplementation - continue BID electrolytes while on CRRT  - On 4K bath    3.  Anion gap metabolic acidosis: Secondary to acute kidney injury, corrected with CRRT.  4.  Multifocal trauma:  - Multiple osseous injuries and status post left femoral internal fixation with intramedullary rod with additional management per trauma surgery.    5. Acute hypoxic respiratory failure  - Remains ventilator dependent with ongoing management for bilateral pulmonary contusions and small right pneumothorax.   - On broad-spectrum antimicrobial therapy per primary team  - optimize volume status with RRT  6. Chronic diastolic CHF - optimize volume status with CRRT   7.  Acute blood loss anemia  - acute blood loss anemia associated with trauma.  Status post earlier PRBC transfusion  - Trend Hb  - improved with PRBC  8. CKD stage 3b - Note baseline Cr is 1.8 - 2.4  - see that his chart is  marked for merge - he came in as a trauma.  His old chart has the baseline data  Disposition - continue inpatient monitoring    Subjective:     Seen and examined on CRRT.  He had 2.4 liters UF over 12/1 with CRRT.  He also had 1 liter UOP over 12/1.  He has had a goal of net negative 50 ml/hr as tolerated.  Spoke with bedside RN and she states team is working toward extubation.  He is more interactive with nursing but not following commands.  He had a CTA on 12/1.  Nursing has called RT to assess his vent as no cuff leak. Some secretions per nursing.   Review of systems:  Unable to obtain given intubated    Objective:   BP 131/74 (BP Location: Right Arm)   Pulse 76   Temp 99.1 F (37.3 C) (Axillary)   Resp 20   Ht 6' (1.829 m)   Wt 87 kg   SpO2 100%   BMI 26.01 kg/m   Intake/Output Summary (Last 24 hours) at 10/06/2024 0759 Last data filed at 10/06/2024 0730 Gross per 24 hour  Intake 2064.37 ml  Output 3466 ml  Net -1401.63 ml   Weight change: -2.1 kg  Physical Exam:    General adult male in bed in no acute distress, critically ill and intubated HEENT normocephalic extraocular movements intact sclera anicteric Neck supple trachea midline Lungs rhonchi on auscultation; FIO2 40 and PEEP 5 Heart S1S2  no rub Abdomen soft nontender nondistended 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 Enteric tube in gastric fundus. ET tube with tip 6.6 cm above carina. LUNGS AND PLEURA: Improved right upper lobe opacities. No pleural effusion. No pneumothorax. HEART AND MEDIASTINUM: No acute abnormality of the cardiac and  mediastinal silhouettes. BONES AND SOFT TISSUES: No acute osseous abnormality. IMPRESSION: 1. Improved right upper lobe opacities. Electronically signed by: Waddell Calk MD 10/06/2024 05:50 AM EST RP Workstation: [id number]   ECHOCARDIOGRAM COMPLETE Result Date: 10/05/2024    ECHOCARDIOGRAM REPORT   Patient Name:   Scott Greer Date of Exam: 10/05/2024 Medical Rec #:  [id number]         Height:       72.0 in Accession #:    [id number]        Weight:       196.4 lb Date of Birth:  1956-04-17        BSA:          2.114 m Patient Age:    68 years          BP:           103/60 mmHg Patient Gender: M                 HR:           83 bpm. Exam Location:  Inpatient Procedure: 2D Echo, Cardiac Doppler and Color Doppler (Both Spectral and Color            Flow Doppler were utilized during procedure). Indications:    Stroke I63.9  History:        Patient has no prior history of Echocardiogram examinations.  Sonographer:    Jayson Gaskins Referring Phys: [id number] ROCKY JAYSON LIKES IMPRESSIONS  1. Left ventricular ejection fraction, by estimation, is 50 to 55%. The left ventricle has low normal function. The left ventricle has no regional wall motion abnormalities. Left ventricular diastolic parameters were normal.  2. Right ventricular systolic function is normal. The right ventricular size is normal.  3. The mitral valve is grossly normal. Trivial mitral valve regurgitation. No evidence of mitral stenosis.  4. The aortic valve was not well visualized. There is mild calcification of the aortic valve. Aortic valve regurgitation is not visualized.  5. The inferior vena cava is dilated in size with <50% respiratory variability, suggesting right atrial pressure of 15 mmHg. Comparison(s): No prior Echocardiogram. Conclusion(s)/Recommendation(s): Otherwise normal echocardiogram, with minor abnormalities described in the report. No intracardiac source of embolism detected on this transthoracic study. Consider a transesophageal  echocardiogram to exclude cardiac source of embolism if clinically indicated. Poor parasternal windows. FINDINGS  Left Ventricle: Left ventricular ejection fraction, by estimation, is 50 to 55%. The left ventricle has low normal function. The left ventricle has no regional wall motion abnormalities. The left ventricular internal cavity size was normal in size. There is borderline left ventricular hypertrophy. Left ventricular diastolic parameters were normal. Right Ventricle: The right ventricular size is normal. No increase in right ventricular wall thickness. Right ventricular systolic function is normal. Left Atrium: Left atrial size was normal in size. Right Atrium: Right atrial size was normal in  size. Pericardium: There is no evidence of pericardial effusion. Mitral Valve: The mitral valve is grossly normal. Trivial mitral valve regurgitation. No evidence of mitral valve stenosis. Tricuspid Valve: The tricuspid valve is grossly normal. Tricuspid valve regurgitation is trivial. No evidence of tricuspid stenosis. Aortic Valve: The aortic valve was not well visualized. There is mild calcification of the aortic valve. Aortic valve regurgitation is not visualized. Aortic valve mean gradient measures 5.0 mmHg. Aortic valve peak gradient measures 8.5 mmHg. Aortic valve area, by VTI measures 3.26 cm. Pulmonic Valve: The pulmonic valve was not well visualized. Pulmonic valve regurgitation is not visualized. No evidence of pulmonic stenosis. Aorta: The aortic root is normal in size and structure. Venous: The inferior vena cava is dilated in size with less than 50% respiratory variability, suggesting right atrial pressure of 15 mmHg. IAS/Shunts: No atrial level shunt detected by color flow Doppler.  LEFT VENTRICLE PLAX 2D LVIDd:         5.40 cm   Diastology LVIDs:         4.00 cm   LV e' medial:    9.03 cm/s LV PW:         1.30 cm   LV E/e' medial:  11.7 LV IVS:        0.90 cm   LV e' lateral:   11.40 cm/s LVOT diam:      2.02 cm   LV E/e' lateral: 9.3 LV SV:         95 LV SV Index:   45 LVOT Area:     3.20 cm  RIGHT VENTRICLE             IVC RV S prime:     15.10 cm/s  IVC diam: 2.54 cm TAPSE (M-mode): 2.3 cm LEFT ATRIUM             Index        RIGHT ATRIUM           Index LA Vol (A2C):   56.5 ml 26.72 ml/m  RA Area:     18.60 cm LA Vol (A4C):   56.6 ml 26.77 ml/m  RA Volume:   46.10 ml  21.80 ml/m LA Biplane Vol: 59.8 ml 28.28 ml/m  AORTIC VALVE AV Area (Vmax):    3.12 cm AV Area (Vmean):   3.12 cm AV Area (VTI):     3.26 cm AV Vmax:           146.00 cm/s AV Vmean:          111.000 cm/s AV VTI:            0.290 m AV Peak Grad:      8.5 mmHg AV Mean Grad:      5.0 mmHg LVOT Vmax:         142.00 cm/s LVOT Vmean:        108.000 cm/s LVOT VTI:          0.295 m LVOT/AV VTI ratio: 1.02  AORTA Ao Root diam: 2.88 cm MITRAL VALVE MV Area (PHT): 3.61 cm     SHUNTS MV Decel Time: 210 msec     Systemic VTI:  0.30 m MV E velocity: 106.00 cm/s  Systemic Diam: 2.02 cm MV A velocity: 70.70 cm/s MV E/A ratio:  1.50 Shelda Bruckner MD Electronically signed by Shelda Bruckner MD Signature Date/Time: 10/05/2024/5:41:34 PM    Final    CT ANGIO HEAD NECK W WO CM Result Date: 10/05/2024 EXAM: CTA Head and  Neck with Intravenous Contrast. CT Head without Contrast. CLINICAL HISTORY: Stroke/TIA, determine embolic source Stroke/TIA, determine embolic source TECHNIQUE: Axial CTA images of the head and neck performed with intravenous contrast. MIP reconstructed images were created and reviewed. Axial computed tomography images of the head/brain performed without intravenous contrast. Note: Per PQRS, the description of internal carotid artery percent stenosis, including 0 percent or normal exam, is based on [employer] Symptomatic Carotid Endarterectomy Trial (NASCET) criteria. Dose reduction technique was used including one or more of the following: automated exposure control, adjustment of mA and kV according to patient size,  and/or iterative reconstruction. CONTRAST: With; COMPARISON: MRI head 10/04/2024 FINDINGS: CT HEAD: BRAIN: No acute intraparenchymal hemorrhage. No mass lesion. Known small acute infarcts were better characterized on recent MRI. No midline shift or extra-axial collection. VENTRICLES: No hydrocephalus. ORBITS: Remote left medial orbital wall fracture SINUSES AND MASTOIDS: The paranasal sinuses and mastoid air cells are clear. CTA NECK: COMMON CAROTID ARTERIES: No significant stenosis. No dissection or occlusion. INTERNAL CAROTID ARTERIES: No stenosis by NASCET criteria. No dissection or occlusion. VERTEBRAL ARTERIES: No significant stenosis. No dissection or occlusion. CTA HEAD: ANTERIOR CEREBRAL ARTERIES: No significant stenosis. No occlusion. No aneurysm. Hypoplastic right A1 ACA. MIDDLE CEREBRAL ARTERIES: No significant stenosis. No occlusion. No aneurysm. POSTERIOR CEREBRAL ARTERIES: No significant stenosis. No occlusion. No aneurysm. BASILAR ARTERY: No significant stenosis. No occlusion. No aneurysm. OTHER: SOFT TISSUES: Left sided air-filled laryngocele. No masses or lymphadenopathy. BONES: No acute osseous abnormality. LUNGS: Patchy airspace opacities better characterized on recent CT chest. IMPRESSION: 1. No large vessel occlusion or significant stenosis. 2. Left sided air-filled laryngocele. 3. Patchy airspace opacities better characterized on recent CT chest. Electronically signed by: Gilmore Molt MD 10/05/2024 01:14 PM EST RP Workstation: [id number]   MR BRAIN W WO CONTRAST Result Date: 10/04/2024 EXAM: MRI BRAIN WITH AND WITHOUT CONTRAST 10/04/2024 11:54:33 AM TECHNIQUE: Multiplanar multisequence MRI of the head/brain was performed with and without the administration of intravenous contrast. 9 mL (gadobutrol  (GADAVIST ) 1 MMOL/ML injection 9 mL GADOBUTROL  1 MMOL/ML IV SOLN). COMPARISON: CT of the head dated 10/03/2024. CLINICAL HISTORY: Mental status change, unknown cause. FINDINGS: BRAIN AND  VENTRICLES: Numerous foci of restricted diffusion are scattered throughout the brain, particularly involving the cerebral hemispheres. The majority of these lesions are within the subcortical and deep cerebral white matter. There are lesions within the splenium of the corpus callosum and within the middle cerebellar peduncles bilaterally. The scattered lacunar infarcts also have a very unusual distribution. Innumerable foci of hemosiderin staining are also present throughout the cerebral hemispheres, compatible with micro hemorrhage. No mass effect or midline shift. No hydrocephalus. The sella is unremarkable. Normal flow voids. No mass. There is no abnormal parenchymal or meningeal enhancement. There is age-related atrophy and mild-to-moderate periventricular and deep cerebral white matter disease. The findings are unusual and may represent underlying amyloid angiopathy. There could be an element of shear injury, given recent trauma, but there is no evidence of hemorrhagic shear injury on the recent CT. ORBITS: No acute abnormality. SINUSES: No acute abnormality. BONES AND SOFT TISSUES: Normal bone marrow signal and enhancement. No acute soft tissue abnormality. IMPRESSION: 1. Numerous foci of restricted diffusion scattered throughout the brain, particularly involving the cerebral hemispheres, with lesions within the splenium of the corpus callosum and within the middle cerebellar peduncles bilaterally. These findings are unusual and may represent underlying amyloid angiopathy or an element of shear injury, given recent trauma. No evidence of hemorrhagic shear injury on the recent CT. 2.  Innumerable foci of hemosiderin staining throughout the cerebral hemispheres, compatible with microhemorrhage. 3. Age-related atrophy and mild-to-moderate periventricular and deep cerebral white matter disease. Electronically signed by: Evalene Coho MD 10/04/2024 12:15 PM EST RP Workstation: [id number]     Note MRI was  obtained recently for decreased responsiveness; the MRI had scattered areas of restricted diffusion - per radiology, may represent underlying amyloid angiopathy or an element of shear injury, given recent trauma.  EEG with no definite seizures and with toxic-metabolic etiology   Labs: BMET Recent Labs  Lab 10/03/24 1622 10/04/24 0352 10/04/24 1514 10/04/24 2343 10/05/24 0413 10/05/24 1611 10/06/24 0343  NA 137 135 138 138 138 139 141  K 4.7 3.9 3.6 3.6 3.4* 4.6 4.0  CL 103 94* 97* 97* 98 103 102  CO2 21* 25 28 27 28 23 30   GLUCOSE 144* 180* 178* 214* 266* 134* 165*  BUN 60* 58* 58* 54* 52* 47* 44*  CREATININE 3.71* 3.32* 3.16* 2.74* 2.61* 2.23* 2.08*  CALCIUM  7.6* 7.5* 7.4* 7.8* 7.7* 7.5* 8.0*  PHOS 6.1* 4.9* 4.9* 3.6 3.2 3.3 2.6   CBC Recent Labs  Lab 10/03/24 0323 10/04/24 0352 10/04/24 1655 10/04/24 2343 10/05/24 0413 10/06/24 0343  WBC 8.8 9.5  --   --  9.7 9.3  HGB 7.3* 7.3* 7.3* 10.1* 9.4* 9.0*  HCT 21.3* 21.7* 22.4* 29.9* 27.9* 27.8*  MCV 81.6 80.7  --   --  82.3 84.2  PLT 187 170  --   --  169 175    Medications:     acetaminophen   1,000 mg Per Tube Q6H   amLODipine   10 mg Per Tube Daily   Chlorhexidine  Gluconate Cloth  6 each Topical Daily   feeding supplement (PROSource TF20)  60 mL Per Tube QID   insulin  aspart  0-20 Units Subcutaneous Q4H   insulin  aspart  3 Units Subcutaneous Q4H   multivitamin  1 tablet Per Tube QHS   mouth rinse  15 mL Mouth Rinse Q2H   pantoprazole  (PROTONIX ) IV  40 mg Intravenous Q24H   polyethylene glycol  17 g Per Tube BID   senna  1 tablet Per Tube Daily   thiamine  100 mg Per Tube Daily    Katheryn JAYSON Saba, MD 10/06/2024, 8:18 AM
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
## 2024-10-06 NOTE — Progress Notes (Signed)
 Frequent episodes of coughing, biting the tube (despite bite block placement) and dyssynchrony with the ventilator. PRN fentanyl  given with relief.

## 2024-10-06 NOTE — Procedures (Signed)
 Extubation Procedure Note  Patient Details:   Name: Scott Greer DOB: [birth year] MRN: [id number]   Airway Documentation:    Vent end date: 10/06/24 Vent end time: 0823   Evaluation  O2 sats: stable throughout Complications: No apparent complications Patient did tolerate procedure well. Bilateral Breath Sounds: Diminished, Rhonchi   No, pt could not speak post extubation.  Pt extubated to 3 l/m [place] per order.  Ozell KATHEE Blase 10/06/2024, 8:24 AM

## 2024-10-06 NOTE — Progress Notes (Signed)
 SLP Cancellation Note  Patient Details Name: Scott Greer MRN: [id number] DOB: July 18, 1956   Cancelled treatment:       Reason Eval/Treat Not Completed: Patient not medically ready. Extubated earlier this date but is not ready for SLP evaluations today per RN. SLP will f/u as able.    Damien Blumenthal, M.A., CCC-SLP Speech Language Pathology, Acute Rehabilitation Services  Secure Chat preferred [phone]  10/06/2024, 3:26 PM
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
## 2024-10-07 ENCOUNTER — Inpatient Hospital Stay (HOSPITAL_COMMUNITY)

## 2024-10-07 DIAGNOSIS — N179 Acute kidney failure, unspecified: Secondary | ICD-10-CM

## 2024-10-07 DIAGNOSIS — G9340 Encephalopathy, unspecified: Secondary | ICD-10-CM | POA: Diagnosis not present

## 2024-10-07 DIAGNOSIS — E119 Type 2 diabetes mellitus without complications: Secondary | ICD-10-CM

## 2024-10-07 DIAGNOSIS — S27322A Contusion of lung, bilateral, initial encounter: Secondary | ICD-10-CM

## 2024-10-07 DIAGNOSIS — S270XXA Traumatic pneumothorax, initial encounter: Secondary | ICD-10-CM | POA: Diagnosis not present

## 2024-10-07 DIAGNOSIS — I10 Essential (primary) hypertension: Secondary | ICD-10-CM

## 2024-10-07 DIAGNOSIS — N1832 Chronic kidney disease, stage 3b: Secondary | ICD-10-CM | POA: Diagnosis not present

## 2024-10-07 DIAGNOSIS — T1490XA Injury, unspecified, initial encounter: Secondary | ICD-10-CM

## 2024-10-07 DIAGNOSIS — D649 Anemia, unspecified: Secondary | ICD-10-CM | POA: Diagnosis not present

## 2024-10-07 DIAGNOSIS — I5032 Chronic diastolic (congestive) heart failure: Secondary | ICD-10-CM | POA: Diagnosis not present

## 2024-10-07 DIAGNOSIS — J9601 Acute respiratory failure with hypoxia: Secondary | ICD-10-CM | POA: Diagnosis not present

## 2024-10-07 LAB — CBC
HCT: 27 % — ABNORMAL LOW (ref 39.0–52.0)
Hemoglobin: 8.5 g/dL — ABNORMAL LOW (ref 13.0–17.0)
MCH: 27.5 pg (ref 26.0–34.0)
MCHC: 31.5 g/dL (ref 30.0–36.0)
MCV: 87.4 fL (ref 80.0–100.0)
Platelets: 208 K/uL (ref 150–400)
RBC: 3.09 MIL/uL — ABNORMAL LOW (ref 4.22–5.81)
RDW: 15.8 % — ABNORMAL HIGH (ref 11.5–15.5)
WBC: 11.3 K/uL — ABNORMAL HIGH (ref 4.0–10.5)
nRBC: 0 % (ref 0.0–0.2)

## 2024-10-07 LAB — GLUCOSE, CAPILLARY
Glucose-Capillary: 108 mg/dL — ABNORMAL HIGH (ref 70–99)
Glucose-Capillary: 118 mg/dL — ABNORMAL HIGH (ref 70–99)
Glucose-Capillary: 124 mg/dL — ABNORMAL HIGH (ref 70–99)
Glucose-Capillary: 128 mg/dL — ABNORMAL HIGH (ref 70–99)
Glucose-Capillary: 149 mg/dL — ABNORMAL HIGH (ref 70–99)
Glucose-Capillary: 150 mg/dL — ABNORMAL HIGH (ref 70–99)

## 2024-10-07 LAB — BASIC METABOLIC PANEL WITH GFR
Anion gap: 11 (ref 5–15)
BUN: 54 mg/dL — ABNORMAL HIGH (ref 8–23)
CO2: 28 mmol/L (ref 22–32)
Calcium: 8.7 mg/dL — ABNORMAL LOW (ref 8.9–10.3)
Chloride: 103 mmol/L (ref 98–111)
Creatinine, Ser: 2.28 mg/dL — ABNORMAL HIGH (ref 0.61–1.24)
GFR, Estimated: 31 mL/min — ABNORMAL LOW
Glucose, Bld: 163 mg/dL — ABNORMAL HIGH (ref 70–99)
Potassium: 4.3 mmol/L (ref 3.5–5.1)
Sodium: 142 mmol/L (ref 135–145)

## 2024-10-07 LAB — MAGNESIUM: Magnesium: 2.2 mg/dL (ref 1.7–2.4)

## 2024-10-07 LAB — RENAL FUNCTION PANEL
Albumin: 2.4 g/dL — ABNORMAL LOW (ref 3.5–5.0)
Anion gap: 4 — ABNORMAL LOW (ref 5–15)
BUN: 45 mg/dL — ABNORMAL HIGH (ref 8–23)
CO2: 31 mmol/L (ref 22–32)
Calcium: 8.5 mg/dL — ABNORMAL LOW (ref 8.9–10.3)
Chloride: 107 mmol/L (ref 98–111)
Creatinine, Ser: 2.2 mg/dL — ABNORMAL HIGH (ref 0.61–1.24)
GFR, Estimated: 32 mL/min — ABNORMAL LOW
Glucose, Bld: 109 mg/dL — ABNORMAL HIGH (ref 70–99)
Phosphorus: 2.9 mg/dL (ref 2.5–4.6)
Potassium: 4.2 mmol/L (ref 3.5–5.1)
Sodium: 142 mmol/L (ref 135–145)

## 2024-10-07 LAB — CULTURE, RESPIRATORY W GRAM STAIN: Culture: NORMAL

## 2024-10-07 LAB — AMMONIA: Ammonia: 17 umol/L (ref 9–35)

## 2024-10-07 MED ORDER — PROSOURCE TF20 ENFIT COMPATIBL EN LIQD
60.0000 mL | Freq: Two times a day (BID) | ENTERAL | Status: DC
  Administered 2024-10-07 – 2024-10-20 (×22): 60 mL
  Filled 2024-10-07 (×22): qty 60

## 2024-10-07 MED ORDER — FUROSEMIDE 10 MG/ML IJ SOLN
60.0000 mg | Freq: Once | INTRAMUSCULAR | Status: AC
  Administered 2024-10-07: 60 mg via INTRAVENOUS
  Filled 2024-10-07: qty 6

## 2024-10-07 MED ORDER — SODIUM CHLORIDE 0.9 % IV SOLN
3.0000 g | Freq: Three times a day (TID) | INTRAVENOUS | Status: AC
  Administered 2024-10-07 – 2024-10-09 (×8): 3 g via INTRAVENOUS
  Filled 2024-10-07 (×9): qty 8

## 2024-10-07 MED ADMIN — ORAL CARE MOUTH RINSE: 15 mL | OROMUCOSAL | NDC 99999080097

## 2024-10-07 MED ADMIN — Insulin Aspart Inj Soln 100 Unit/ML: 3 [IU] | SUBCUTANEOUS | NDC 73070010011

## 2024-10-07 MED ADMIN — Acetaminophen Tab 500 MG: 1000 mg | NDC 50580045711

## 2024-10-07 MED ADMIN — Chlorhexidine Gluconate Pads 2%: 6 | TOPICAL | NDC 53462070523

## 2024-10-07 MED ADMIN — Oxycodone HCl Tab 5 MG: 5 mg | NDC 68084035411

## 2024-10-07 MED ADMIN — Pantoprazole Sodium For IV Soln 40 MG (Base Equiv): 40 mg | INTRAVENOUS | NDC 00008092351

## 2024-10-07 MED ADMIN — Heparin Sodium (Porcine) Inj 5000 Unit/ML: 5000 [IU] | SUBCUTANEOUS | NDC 72572025501

## 2024-10-07 MED ADMIN — B-Complex w/ C & Folic Acid Tab 0.8 MG: 1 | NDC 60258016001

## 2024-10-07 MED ADMIN — Amlodipine Besylate Tab 10 MG (Base Equivalent): 10 mg | NDC 00904637161

## 2024-10-07 MED ADMIN — Protein Liquid (Enteral): 60 mL | NDC 9468801446

## 2024-10-07 MED ADMIN — Sennosides Tab 8.6 MG: 8.6 mg | NDC 00904725261

## 2024-10-07 MED ADMIN — Thiamine Mononitrate Tab 100 MG: 100 mg | NDC 54629005701

## 2024-10-07 MED ADMIN — Morphine Sulfate IV Soln PF 2 MG/ML: 2 mg | INTRAVENOUS | NDC 63323045200

## 2024-10-07 MED FILL — Heparin Sodium (Porcine) Inj 5000 Unit/ML: 5000.0000 [IU] | INTRAMUSCULAR | Qty: 1 | Status: AC

## 2024-10-07 NOTE — Procedures (Signed)
 Cortrak  Person Inserting Tube:  Mady Dolly, RD Tube Type:  Cortrak - 43 inches Tube Size:  10 Tube Location:  Left nare Secured by: Bridle Technique Used to Measure Tube Placement:  Marking at nare/corner of mouth Cortrak Secured At:  65 cm Initial Placement Verification:  Xray  Cortrak Tube Team Note:  Consult received to place a Cortrak feeding tube.   X-ray is required. RN may begin using tube once appropriate positioning confirmed.   If the tube becomes dislodged please keep the tube and contact the Cortrak team at [email or web address] for replacement.  If after hours and replacement cannot be delayed, place a NG tube and confirm placement with an abdominal x-ray.   Dolly Mady MS, RD, LDN Registered Dietitian Clinical Nutrition RD Inpatient Contact Info in Amion
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
## 2024-10-07 NOTE — Progress Notes (Signed)
 SLP Cancellation Note  Patient Details Name: Kashmere Staffa MRN: [id number] DOB: 1956/02/23   Cancelled treatment:       Reason Eval/Treat Not Completed: Fatigue/lethargy limiting ability to participate (Per RN, not consistently alert. Will check back next date.)  Rea Pass MA, CCC-SLP  Wilder Kurowski Meryl 10/07/2024, 11:36 AM
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
## 2024-10-08 LAB — BASIC METABOLIC PANEL WITH GFR
Anion gap: 10 (ref 5–15)
BUN: 65 mg/dL — ABNORMAL HIGH (ref 8–23)
CO2: 28 mmol/L (ref 22–32)
Calcium: 8.6 mg/dL — ABNORMAL LOW (ref 8.9–10.3)
Chloride: 108 mmol/L (ref 98–111)
Creatinine, Ser: 2.42 mg/dL — ABNORMAL HIGH (ref 0.61–1.24)
GFR, Estimated: 29 mL/min — ABNORMAL LOW
Glucose, Bld: 154 mg/dL — ABNORMAL HIGH (ref 70–99)
Potassium: 4.6 mmol/L (ref 3.5–5.1)
Sodium: 146 mmol/L — ABNORMAL HIGH (ref 135–145)

## 2024-10-08 LAB — CBC
HCT: 26.3 % — ABNORMAL LOW (ref 39.0–52.0)
Hemoglobin: 8.2 g/dL — ABNORMAL LOW (ref 13.0–17.0)
MCH: 27.8 pg (ref 26.0–34.0)
MCHC: 31.2 g/dL (ref 30.0–36.0)
MCV: 89.2 fL (ref 80.0–100.0)
Platelets: 248 K/uL (ref 150–400)
RBC: 2.95 MIL/uL — ABNORMAL LOW (ref 4.22–5.81)
RDW: 15.9 % — ABNORMAL HIGH (ref 11.5–15.5)
WBC: 12.1 K/uL — ABNORMAL HIGH (ref 4.0–10.5)
nRBC: 0 % (ref 0.0–0.2)

## 2024-10-08 LAB — GLUCOSE, CAPILLARY
Glucose-Capillary: 107 mg/dL — ABNORMAL HIGH (ref 70–99)
Glucose-Capillary: 112 mg/dL — ABNORMAL HIGH (ref 70–99)
Glucose-Capillary: 138 mg/dL — ABNORMAL HIGH (ref 70–99)
Glucose-Capillary: 145 mg/dL — ABNORMAL HIGH (ref 70–99)
Glucose-Capillary: 160 mg/dL — ABNORMAL HIGH (ref 70–99)
Glucose-Capillary: 165 mg/dL — ABNORMAL HIGH (ref 70–99)

## 2024-10-08 LAB — MAGNESIUM: Magnesium: 2.2 mg/dL (ref 1.7–2.4)

## 2024-10-08 MED ADMIN — ORAL CARE MOUTH RINSE: 15 mL | OROMUCOSAL | NDC 99999080097

## 2024-10-08 MED ADMIN — Insulin Aspart Inj Soln 100 Unit/ML: 3 [IU] | SUBCUTANEOUS | NDC 73070010011

## 2024-10-08 MED ADMIN — Acetaminophen Tab 500 MG: 1000 mg | NDC 50580045711

## 2024-10-08 MED ADMIN — Chlorhexidine Gluconate Pads 2%: 6 | TOPICAL | NDC 53462070523

## 2024-10-08 MED ADMIN — Insulin Aspart Inj Soln 100 Unit/ML: 4 [IU] | SUBCUTANEOUS | NDC 73070010011

## 2024-10-08 MED ADMIN — Oxycodone HCl Tab 5 MG: 5 mg | NDC 68084035411

## 2024-10-08 MED ADMIN — Polyethylene Glycol 3350 Oral Packet 17 GM: 17 g | NDC 00904693186

## 2024-10-08 MED ADMIN — Heparin Sodium (Porcine) Inj 5000 Unit/ML: 5000 [IU] | SUBCUTANEOUS | NDC 72572025501

## 2024-10-08 MED ADMIN — Lidocaine Patch 5%: 2 | TRANSDERMAL | NDC 65162079104

## 2024-10-08 MED ADMIN — Amlodipine Besylate Tab 10 MG (Base Equivalent): 10 mg | NDC 00904637161

## 2024-10-08 MED ADMIN — Water For Irrigation, Sterile Irrigation Soln: 150 mL | NDC 99999080061

## 2024-10-08 MED ADMIN — Fentanyl Citrate PF Soln Prefilled Syringe 50 MCG/ML: 50 ug | INTRAVENOUS | NDC 63323080801

## 2024-10-08 MED ADMIN — Sennosides Tab 8.6 MG: 8.6 mg | NDC 00904725261

## 2024-10-08 MED ADMIN — Darbepoetin Alfa Soln Prefilled Syringe 40 MCG/0.4ML: 40 ug | SUBCUTANEOUS | NDC 55513002101

## 2024-10-08 MED ADMIN — Thiamine Mononitrate Tab 100 MG: 100 mg | NDC 54629005701

## 2024-10-08 MED ADMIN — Aspirin Chew Tab 81 MG: 81 mg | NDC 00904679480

## 2024-10-08 MED FILL — Fentanyl Citrate PF Soln Prefilled Syringe 50 MCG/ML: 25.0000 ug | INTRAMUSCULAR | Qty: 1 | Status: AC

## 2024-10-08 MED FILL — Darbepoetin Alfa Soln Prefilled Syringe 40 MCG/0.4ML: 40.0000 ug | INTRAMUSCULAR | Qty: 0.4 | Status: AC

## 2024-10-08 MED FILL — Aspirin Suppos 300 MG: 300.0000 mg | RECTAL | Qty: 1 | Status: AC

## 2024-10-08 MED FILL — Lidocaine Patch 5%: 2.0000 | CUTANEOUS | Qty: 2 | Status: AC

## 2024-10-08 MED FILL — Aspirin Chew Tab 81 MG: 81.0000 mg | ORAL | Qty: 1 | Status: AC
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
## 2024-10-08 NOTE — Progress Notes (Signed)
 RN entered room to find Cortrak at 25 cm at the nare. Dr. Sebastian paged and notified of findings. Per MD ok to wait for cortrak replacement tomorrow and hold per tube medications tonight. Order placed for cortrak replacement.
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
## 2024-10-09 ENCOUNTER — Inpatient Hospital Stay (HOSPITAL_COMMUNITY)

## 2024-10-09 LAB — BASIC METABOLIC PANEL WITH GFR
Anion gap: 12 (ref 5–15)
BUN: 62 mg/dL — ABNORMAL HIGH (ref 8–23)
CO2: 29 mmol/L (ref 22–32)
Calcium: 8.7 mg/dL — ABNORMAL LOW (ref 8.9–10.3)
Chloride: 108 mmol/L (ref 98–111)
Creatinine, Ser: 2.28 mg/dL — ABNORMAL HIGH (ref 0.61–1.24)
GFR, Estimated: 31 mL/min — ABNORMAL LOW
Glucose, Bld: 141 mg/dL — ABNORMAL HIGH (ref 70–99)
Potassium: 4.5 mmol/L (ref 3.5–5.1)
Sodium: 149 mmol/L — ABNORMAL HIGH (ref 135–145)

## 2024-10-09 LAB — CBC
HCT: 27.2 % — ABNORMAL LOW (ref 39.0–52.0)
Hemoglobin: 8.3 g/dL — ABNORMAL LOW (ref 13.0–17.0)
MCH: 27.1 pg (ref 26.0–34.0)
MCHC: 30.5 g/dL (ref 30.0–36.0)
MCV: 88.9 fL (ref 80.0–100.0)
Platelets: 324 K/uL (ref 150–400)
RBC: 3.06 MIL/uL — ABNORMAL LOW (ref 4.22–5.81)
RDW: 15.9 % — ABNORMAL HIGH (ref 11.5–15.5)
WBC: 12.3 K/uL — ABNORMAL HIGH (ref 4.0–10.5)
nRBC: 0 % (ref 0.0–0.2)

## 2024-10-09 LAB — GLUCOSE, CAPILLARY
Glucose-Capillary: 115 mg/dL — ABNORMAL HIGH (ref 70–99)
Glucose-Capillary: 146 mg/dL — ABNORMAL HIGH (ref 70–99)
Glucose-Capillary: 121 mg/dL — ABNORMAL HIGH (ref 70–99)
Glucose-Capillary: 187 mg/dL — ABNORMAL HIGH (ref 70–99)
Glucose-Capillary: 85 mg/dL (ref 70–99)

## 2024-10-09 LAB — MAGNESIUM: Magnesium: 2.1 mg/dL (ref 1.7–2.4)

## 2024-10-09 MED ORDER — FREE WATER
200.0000 mL | Status: DC
  Administered 2024-10-09 – 2024-10-14 (×20): 200 mL

## 2024-10-09 MED ORDER — BISACODYL 10 MG RE SUPP
10.0000 mg | Freq: Once | RECTAL | Status: AC
  Administered 2024-10-09: 10 mg via RECTAL
  Filled 2024-10-09: qty 1

## 2024-10-09 MED ORDER — FENTANYL CITRATE (PF) 50 MCG/ML IJ SOSY
100.0000 ug | PREFILLED_SYRINGE | Freq: Once | INTRAMUSCULAR | Status: AC
  Administered 2024-10-09: 100 ug via INTRAVENOUS
  Filled 2024-10-09: qty 2

## 2024-10-09 MED ORDER — MAGNESIUM HYDROXIDE 400 MG/5ML PO SUSP: 30.0000 mL | Freq: Once | ORAL | Status: DC

## 2024-10-09 MED ORDER — LACTULOSE 10 GM/15ML PO SOLN
20.0000 g | Freq: Once | ORAL | Status: AC
  Administered 2024-10-09: 20 g
  Filled 2024-10-09: qty 30

## 2024-10-09 MED ADMIN — ORAL CARE MOUTH RINSE: 15 mL | OROMUCOSAL | NDC 99999080097

## 2024-10-09 MED ADMIN — Sennosides Tab 8.6 MG: 8.6 mg | NDC 00904725261

## 2024-10-09 MED ADMIN — Insulin Aspart Inj Soln 100 Unit/ML: 3 [IU] | SUBCUTANEOUS | NDC 73070010011

## 2024-10-09 MED ADMIN — Acetaminophen Tab 500 MG: 1000 mg | NDC 50580045711

## 2024-10-09 MED ADMIN — Chlorhexidine Gluconate Pads 2%: 6 | TOPICAL | NDC 53462070523

## 2024-10-09 MED ADMIN — Insulin Aspart Inj Soln 100 Unit/ML: 4 [IU] | SUBCUTANEOUS | NDC 73070010011

## 2024-10-09 MED ADMIN — Polyethylene Glycol 3350 Oral Packet 17 GM: 17 g | NDC 00904693186

## 2024-10-09 MED ADMIN — Heparin Sodium (Porcine) Inj 5000 Unit/ML: 5000 [IU] | SUBCUTANEOUS | NDC 72572025501

## 2024-10-09 MED ADMIN — B-Complex w/ C & Folic Acid Tab 0.8 MG: 1 | NDC 60258016001

## 2024-10-09 MED ADMIN — Lidocaine Patch 5%: 2 | TRANSDERMAL | NDC 65162079104

## 2024-10-09 MED ADMIN — Amlodipine Besylate Tab 10 MG (Base Equivalent): 10 mg | NDC 00904637161

## 2024-10-09 MED ADMIN — Fentanyl Citrate PF Soln Prefilled Syringe 50 MCG/ML: 50 ug | INTRAVENOUS | NDC 63323080801

## 2024-10-09 MED ADMIN — Oxycodone HCl Tab 5 MG: 10 mg | NDC 00406055223

## 2024-10-09 MED ADMIN — Oxycodone HCl Tab 5 MG: 10 mg | NDC 68084035411

## 2024-10-09 MED ADMIN — Thiamine Mononitrate Tab 100 MG: 100 mg | NDC 54629005701

## 2024-10-09 MED ADMIN — Aspirin Chew Tab 81 MG: 81 mg | NDC 00904679480

## 2024-10-09 MED ADMIN — Hydralazine HCl Tab 25 MG: 25 mg | NDC 60687082211

## 2024-10-09 MED ADMIN — Hydralazine HCl Tab 25 MG: 25 mg | NDC 23155083301

## 2024-10-09 MED ADMIN — Labetalol HCl IV Soln 5 MG/ML: 10 mg | INTRAVENOUS | NDC 00409233924

## 2024-10-09 MED FILL — Sennosides Tab 8.6 MG: 1.0000 | ORAL | Qty: 1 | Status: AC

## 2024-10-09 MED FILL — Oxycodone HCl Tab 5 MG: 5.0000 mg | ORAL | Qty: 2 | Status: AC

## 2024-10-09 MED FILL — Oxycodone HCl Tab 5 MG: 5.0000 mg | ORAL | Qty: 2 | Status: CN

## 2024-10-09 MED FILL — Oxycodone HCl Tab 5 MG: 5.0000 mg | ORAL | Qty: 1 | Status: AC

## 2024-10-09 MED FILL — Oxycodone HCl Tab 5 MG: 5.0000 mg | ORAL | Qty: 1 | Status: CN

## 2024-10-09 MED FILL — Hydralazine HCl Tab 25 MG: 25.0000 mg | ORAL | Qty: 1 | Status: AC
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
## 2024-10-09 NOTE — Consult Note (Addendum)
 Patient was seen this morning laying in bed on my approach. Multiple attempts were made to engage the patient with assessment but he did not respond. Per primary team he was able to state he did not want to be sternal rubbed earlier today so his refusal to speak may be volitional but it is unclear at this time.  1. Insight into Functional Limitations Item Assessment Notes Understands physical limitations (mobility, vision, oxygen use, fall risk) []  Yes [x]  No []  Partial ? Describe: ___________________________________ Acknowledges need for assistance with ADLs/IADLs []  Yes [x]  No []  Partial ? Describe: ___________________________________  2. Understanding of Environmental Barriers Item Assessment Notes Recognizes safety hazards in home (e.g., pets, clutter, lack of supervision) []  Yes [x]  No []  Partial Accepts that home modifications or alternate placement may be required []  Yes [x]  No []  Partial Patient's statement(s): _________________________________________________   3. Ability to Weigh & Prioritize Needs Item Assessment Notes Can list risks of unsafe discharge []  Yes [x]  No []  Partial Can list benefits of recommended setting (SNF/rehab/home-with-services) []  Yes [x]  No []  Partial Demonstrates reasoning that prioritizes health/safety over preferences []  Adequate [x]  Inadequate  4. Memory & Executive Functioning Item Assessment Notes Short-term recall of care plan (within same interview) []  Intact [x]  Impaired Demonstrates ability to organize follow-up (meds, appointments, equipment) []  Yes [x]  No []  Partial Evidence of cognitive impairment impacting decision-making []  None []  Mild []  Moderate [x]  Severe  5. Consistency & Realism of Proposed Discharge Plan Item Assessment Notes Plan feasible with available supports/resources []  Yes []  No [x]  Unclear Plan consistent across discussions / not rapidly changing []  Yes []  No Requires surrogate/guardian involvement [x]  Yes []  No  Overall  Determination Capacity Type Patient Has Capacity? Rationale (brief) Medical Decision-Making []  Yes [x]  No ____________________________________________ Dispositional / Placement []  Yes [x]  No ____________________________________________  Recommendations Additional Actions: []  APS referral [x]  Guardianship consult []  Ethics consult []  Family meeting  Patient lacks capacity for medical decisions and placement at this time. Should he become more amenable to psychiatric evaluation would recommend reconsulting psychiatry to reassess.
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
## 2024-10-09 NOTE — TOC Progression Note (Addendum)
 Transition of Care [facility]) - Progression Note    Patient Details  Name: Menelik Mcfarren MRN: [id number] Date of Birth: 01-Sep-1956  Transition of [facility]) CM/SW Contact  Terrick Allred E Caily Rakers, LCSW Phone Number: 10/09/2024, 9:20 AM  Clinical Narrative:    Call from APS Social Worker [name] who is following this case. Consulted with MD then updated [name] that patient's disorientation is likely coming from his TBI and unclear how much of it is volitional. MD will order Psych consult for capacity. Explained if patient cannot make decisions, we will need someone to make decisions for him to go to rehab, etc. [name] will follow up with her Supervisor for next steps.      Barriers to Discharge: Continued Medical Work up               Expected Discharge Plan and Services       Living arrangements for the past 2 months: Homeless                                       Social Drivers of Health (SDOH) Interventions SDOH Screenings   Food Insecurity: Food Insecurity Present (10/01/2024)  Housing: High Risk (10/01/2024)    Readmission Risk Interventions     No data to display
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
## 2024-10-09 NOTE — Procedures (Signed)
 Cortrak  Person Inserting Tube:  Elihue Josette RAMAN, RD Tube Type:  Cortrak - 43 inches Tube Size:  10 Tube Location:  Left nare Secured by: Bridle Technique Used to Measure Tube Placement:  Marking at nare/corner of mouth Cortrak Secured At:  63 cm Initial Placement Verification:  Xray  Cortrak Tube Team Note:  Consult received to place a Cortrak feeding tube.   Xray required to confirm placement. RN may begin using tube once placement confirmed  If the tube becomes dislodged please keep the tube and contact the Cortrak team at [email or web address] for replacement.  If after hours and replacement cannot be delayed, place a NG tube and confirm placement with an abdominal x-ray.    Josette Elihue, MS, RDN, LDN Clinical Dietitian I Please reach out via secure chat

## 2024-10-10 ENCOUNTER — Inpatient Hospital Stay (HOSPITAL_COMMUNITY)

## 2024-10-10 LAB — GLUCOSE, CAPILLARY
Glucose-Capillary: 107 mg/dL — ABNORMAL HIGH (ref 70–99)
Glucose-Capillary: 135 mg/dL — ABNORMAL HIGH (ref 70–99)
Glucose-Capillary: 143 mg/dL — ABNORMAL HIGH (ref 70–99)
Glucose-Capillary: 143 mg/dL — ABNORMAL HIGH (ref 70–99)
Glucose-Capillary: 148 mg/dL — ABNORMAL HIGH (ref 70–99)
Glucose-Capillary: 189 mg/dL — ABNORMAL HIGH (ref 70–99)
Glucose-Capillary: 198 mg/dL — ABNORMAL HIGH (ref 70–99)

## 2024-10-10 LAB — BASIC METABOLIC PANEL WITH GFR
Anion gap: 9 (ref 5–15)
BUN: 63 mg/dL — ABNORMAL HIGH (ref 8–23)
CO2: 30 mmol/L (ref 22–32)
Calcium: 8.3 mg/dL — ABNORMAL LOW (ref 8.9–10.3)
Chloride: 108 mmol/L (ref 98–111)
Creatinine, Ser: 2.36 mg/dL — ABNORMAL HIGH (ref 0.61–1.24)
GFR, Estimated: 30 mL/min — ABNORMAL LOW
Glucose, Bld: 148 mg/dL — ABNORMAL HIGH (ref 70–99)
Potassium: 4.6 mmol/L (ref 3.5–5.1)
Sodium: 147 mmol/L — ABNORMAL HIGH (ref 135–145)

## 2024-10-10 LAB — CBC
HCT: 27.3 % — ABNORMAL LOW (ref 39.0–52.0)
Hemoglobin: 8.5 g/dL — ABNORMAL LOW (ref 13.0–17.0)
MCH: 27.5 pg (ref 26.0–34.0)
MCHC: 31.1 g/dL (ref 30.0–36.0)
MCV: 88.3 fL (ref 80.0–100.0)
Platelets: 360 K/uL (ref 150–400)
RBC: 3.09 MIL/uL — ABNORMAL LOW (ref 4.22–5.81)
RDW: 16 % — ABNORMAL HIGH (ref 11.5–15.5)
WBC: 10.5 K/uL (ref 4.0–10.5)
nRBC: 0 % (ref 0.0–0.2)

## 2024-10-10 LAB — MAGNESIUM: Magnesium: 2.2 mg/dL (ref 1.7–2.4)

## 2024-10-10 MED ADMIN — ORAL CARE MOUTH RINSE: 15 mL | OROMUCOSAL | NDC 99999080097

## 2024-10-10 MED ADMIN — Insulin Aspart Inj Soln 100 Unit/ML: 3 [IU] | SUBCUTANEOUS | NDC 73070010011

## 2024-10-10 MED ADMIN — Acetaminophen Tab 500 MG: 1000 mg | NDC 50580045711

## 2024-10-10 MED ADMIN — Chlorhexidine Gluconate Pads 2%: 6 | TOPICAL | NDC 53462070523

## 2024-10-10 MED ADMIN — Insulin Aspart Inj Soln 100 Unit/ML: 4 [IU] | SUBCUTANEOUS | NDC 73070010011

## 2024-10-10 MED ADMIN — Heparin Sodium (Porcine) Inj 5000 Unit/ML: 5000 [IU] | SUBCUTANEOUS | NDC 72572025501

## 2024-10-10 MED ADMIN — Lidocaine Patch 5%: 2 | TRANSDERMAL | NDC 65162079104

## 2024-10-10 MED ADMIN — Amlodipine Besylate Tab 10 MG (Base Equivalent): 10 mg | NDC 68382012316

## 2024-10-10 MED ADMIN — Polyvinyl Alcohol Ophth Soln 1.4%: 1 [drp] | OPHTHALMIC | NDC 00536140894

## 2024-10-10 MED ADMIN — Oxycodone HCl Tab 5 MG: 10 mg | NDC 68084035411

## 2024-10-10 MED ADMIN — Aspirin Chew Tab 81 MG: 81 mg | NDC 00904679480

## 2024-10-10 MED ADMIN — Hydralazine HCl Tab 25 MG: 25 mg | NDC 23155083301
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
## 2024-10-10 NOTE — Progress Notes (Signed)
 Radiologist requesting on call trauma MD to call  back at [phone], regarding critical results of abd/chest xray. MD paged at [phone] to notify of this request.

## 2024-10-10 NOTE — Progress Notes (Addendum)
 Pt removed cortrak. PA notified/paged at [phone]. No new orders at this time. Pts left arm remains in sling, patient bit his mitten off on his right hand. Mitten replaced.  *On call PA notified / paged at [phone]. Awaiting orders.
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
## 2024-10-10 NOTE — Progress Notes (Signed)
 Nutrition Consult - Brief Note  Received consult because patient pulled out his Cortrak tube again. TF is currently on hold. RN is planning to place a NG tube today and resume TF. Cortrak service will be available on Monday.   Suzen HUNT RD, LDN, CNSC Contact via secure chat. If unavailable, use group chat RD Inpatient.

## 2024-10-10 NOTE — Progress Notes (Addendum)
 Patient oriented to self only. Patient removed cortrak, MD notified. Requested right soft wrist restraints, due to patient removing cortrak twice. Order received, restraint applied. Attempted placing NG tube, per new order. Xray ordered to verify placement. Awaiting results.  Juanice Warburton L Szymon Foiles, RN

## 2024-10-11 ENCOUNTER — Inpatient Hospital Stay (HOSPITAL_COMMUNITY)

## 2024-10-11 LAB — GLUCOSE, CAPILLARY
Glucose-Capillary: 110 mg/dL — ABNORMAL HIGH (ref 70–99)
Glucose-Capillary: 117 mg/dL — ABNORMAL HIGH (ref 70–99)
Glucose-Capillary: 117 mg/dL — ABNORMAL HIGH (ref 70–99)
Glucose-Capillary: 139 mg/dL — ABNORMAL HIGH (ref 70–99)
Glucose-Capillary: 152 mg/dL — ABNORMAL HIGH (ref 70–99)
Glucose-Capillary: 163 mg/dL — ABNORMAL HIGH (ref 70–99)

## 2024-10-11 LAB — RENAL FUNCTION PANEL
Albumin: 2.7 g/dL — ABNORMAL LOW (ref 3.5–5.0)
Anion gap: 15 (ref 5–15)
BUN: 52 mg/dL — ABNORMAL HIGH (ref 8–23)
CO2: 26 mmol/L (ref 22–32)
Calcium: 9.2 mg/dL (ref 8.9–10.3)
Chloride: 108 mmol/L (ref 98–111)
Creatinine, Ser: 2.18 mg/dL — ABNORMAL HIGH (ref 0.61–1.24)
GFR, Estimated: 33 mL/min — ABNORMAL LOW
Glucose, Bld: 115 mg/dL — ABNORMAL HIGH (ref 70–99)
Phosphorus: 3.2 mg/dL (ref 2.5–4.6)
Potassium: 4.4 mmol/L (ref 3.5–5.1)
Sodium: 149 mmol/L — ABNORMAL HIGH (ref 135–145)

## 2024-10-11 LAB — CBC
HCT: 28.1 % — ABNORMAL LOW (ref 39.0–52.0)
Hemoglobin: 8.8 g/dL — ABNORMAL LOW (ref 13.0–17.0)
MCH: 27.3 pg (ref 26.0–34.0)
MCHC: 31.3 g/dL (ref 30.0–36.0)
MCV: 87.3 fL (ref 80.0–100.0)
Platelets: 452 K/uL — ABNORMAL HIGH (ref 150–400)
RBC: 3.22 MIL/uL — ABNORMAL LOW (ref 4.22–5.81)
RDW: 15.9 % — ABNORMAL HIGH (ref 11.5–15.5)
WBC: 12.9 K/uL — ABNORMAL HIGH (ref 4.0–10.5)
nRBC: 0 % (ref 0.0–0.2)

## 2024-10-11 LAB — MAGNESIUM: Magnesium: 2 mg/dL (ref 1.7–2.4)

## 2024-10-11 MED ORDER — LORAZEPAM 2 MG/ML IJ SOLN
1.0000 mg | Freq: Once | INTRAMUSCULAR | Status: AC
  Administered 2024-10-11: 1 mg via INTRAMUSCULAR
  Filled 2024-10-11: qty 1

## 2024-10-11 MED ADMIN — ORAL CARE MOUTH RINSE: 15 mL | OROMUCOSAL | NDC 99999080097

## 2024-10-11 MED ADMIN — Hydralazine HCl Tab 50 MG: 50 mg | NDC 23155083401

## 2024-10-11 MED ADMIN — Sennosides Tab 8.6 MG: 8.6 mg | NDC 00904725280

## 2024-10-11 MED ADMIN — Insulin Aspart Inj Soln 100 Unit/ML: 3 [IU] | SUBCUTANEOUS | NDC 73070010011

## 2024-10-11 MED ADMIN — Acetaminophen Tab 500 MG: 1000 mg | NDC 50580045711

## 2024-10-11 MED ADMIN — Chlorhexidine Gluconate Pads 2%: 6 | TOPICAL | NDC 53462070523

## 2024-10-11 MED ADMIN — Insulin Aspart Inj Soln 100 Unit/ML: 4 [IU] | SUBCUTANEOUS | NDC 73070010011

## 2024-10-11 MED ADMIN — Polyethylene Glycol 3350 Oral Packet 17 GM: 17 g | NDC 00904693186

## 2024-10-11 MED ADMIN — Heparin Sodium (Porcine) Inj 5000 Unit/ML: 5000 [IU] | SUBCUTANEOUS | NDC 72572025501

## 2024-10-11 MED ADMIN — B-Complex w/ C & Folic Acid Tab 0.8 MG: 1 | NDC 60258016001

## 2024-10-11 MED ADMIN — Lidocaine Patch 5%: 2 | TRANSDERMAL | NDC 65162079104

## 2024-10-11 MED FILL — Hydralazine HCl Tab 50 MG: 50.0000 mg | ORAL | Qty: 1 | Status: AC

## 2024-10-11 MED FILL — Hydralazine HCl Tab 50 MG: 50.0000 mg | ORAL | Qty: 1 | Status: CN
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
## 2024-10-11 NOTE — Plan of Care (Signed)

## 2024-10-12 ENCOUNTER — Encounter (HOSPITAL_COMMUNITY): Payer: Self-pay

## 2024-10-12 ENCOUNTER — Inpatient Hospital Stay (HOSPITAL_COMMUNITY)

## 2024-10-12 LAB — BASIC METABOLIC PANEL WITH GFR
Anion gap: 9 (ref 5–15)
BUN: 53 mg/dL — ABNORMAL HIGH (ref 8–23)
CO2: 26 mmol/L (ref 22–32)
Calcium: 8.9 mg/dL (ref 8.9–10.3)
Chloride: 113 mmol/L — ABNORMAL HIGH (ref 98–111)
Creatinine, Ser: 2.17 mg/dL — ABNORMAL HIGH (ref 0.61–1.24)
GFR, Estimated: 33 mL/min — ABNORMAL LOW
Glucose, Bld: 174 mg/dL — ABNORMAL HIGH (ref 70–99)
Potassium: 4.4 mmol/L (ref 3.5–5.1)
Sodium: 148 mmol/L — ABNORMAL HIGH (ref 135–145)

## 2024-10-12 LAB — GLUCOSE, CAPILLARY
Glucose-Capillary: 159 mg/dL — ABNORMAL HIGH (ref 70–99)
Glucose-Capillary: 173 mg/dL — ABNORMAL HIGH (ref 70–99)
Glucose-Capillary: 187 mg/dL — ABNORMAL HIGH (ref 70–99)
Glucose-Capillary: 101 mg/dL — ABNORMAL HIGH (ref 70–99)
Glucose-Capillary: 178 mg/dL — ABNORMAL HIGH (ref 70–99)

## 2024-10-12 LAB — MAGNESIUM: Magnesium: 2 mg/dL (ref 1.7–2.4)

## 2024-10-12 MED ADMIN — ORAL CARE MOUTH RINSE: 15 mL | OROMUCOSAL | NDC 99999080097

## 2024-10-12 MED ADMIN — Hydralazine HCl Tab 50 MG: 50 mg | NDC 23155083401

## 2024-10-12 MED ADMIN — Sennosides Tab 8.6 MG: 8.6 mg | NDC 00904725261

## 2024-10-12 MED ADMIN — Insulin Aspart Inj Soln 100 Unit/ML: 3 [IU] | SUBCUTANEOUS | NDC 73070010011

## 2024-10-12 MED ADMIN — Acetaminophen Tab 500 MG: 1000 mg | NDC 50580045711

## 2024-10-12 MED ADMIN — Insulin Aspart Inj Soln 100 Unit/ML: 4 [IU] | SUBCUTANEOUS | NDC 73070010011

## 2024-10-12 MED ADMIN — Heparin Sodium (Porcine) Inj 5000 Unit/ML: 5000 [IU] | SUBCUTANEOUS | NDC 72572025501

## 2024-10-12 MED ADMIN — B-Complex w/ C & Folic Acid Tab 0.8 MG: 1 | NDC 60258016001

## 2024-10-12 MED ADMIN — Amlodipine Besylate Tab 10 MG (Base Equivalent): 10 mg | NDC 68382012316

## 2024-10-12 MED ADMIN — Oxycodone HCl Tab 5 MG: 5 mg | NDC 00406055223

## 2024-10-12 MED ADMIN — Aspirin Chew Tab 81 MG: 81 mg | NDC 00904679480

## 2024-10-12 MED ADMIN — Nutritional Supplement Liquid: 1000 mL | NDC 7007457472

## 2024-10-12 MED FILL — Nutritional Supplement Liquid: 1000.0000 mL | ORAL | Qty: 1000 | Status: AC
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
## 2024-10-12 NOTE — Progress Notes (Signed)
 Brief note: Labs reviewed At baseline Cr Good UOP HD cath removed Will sign off.  Call with questions  Almarie Bonine MD [facility]

## 2024-10-12 NOTE — Plan of Care (Signed)
  Problem: Clinical Measurements: Goal: Ability to maintain clinical measurements within normal limits will improve Outcome: Progressing   Problem: Skin Integrity: Goal: Risk for impaired skin integrity will decrease Outcome: Progressing   Problem: Coping: Goal: Ability to adjust to condition or change in health will improve Outcome: Progressing   Problem: Fluid Volume: Goal: Ability to maintain a balanced intake and output will improve Outcome: Progressing   Problem: Metabolic: Goal: Ability to maintain appropriate glucose levels will improve Outcome: Progressing
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
## 2024-10-12 NOTE — TOC Progression Note (Addendum)
 Transition of Care [facility]) - Progression Note    Patient Details  Name: Alick Lecomte MRN: [id number] Date of Birth: 09/22/1956  [facility] CM/SW Contact  Carmelita FORBES Carbon, LCSW Phone Number: 10/12/2024, 10:18 AM  Clinical Narrative:     Psych note states Patient lacks capacity for medical decisions and placement at this time. Should he become more amenable to psychiatric evaluation would recommend reconsulting psychiatry to reassess. Updated APS. Following to see how patient participates with PT this week - [facility] may consider patient if he progresses some with therapy.   Update 3:00- [name] with [facility] states she does not feel patient will progress enough with therapy soon to be eligible for AIR.  Cannot send out for SNF with Cortrak.    Barriers to Discharge: Continued Medical Work up               Expected Discharge Plan and Services       Living arrangements for the past 2 months: Homeless                                       Social Drivers of Health (SDOH) Interventions SDOH Screenings   Food Insecurity: Food Insecurity Present (10/01/2024)  Housing: High Risk (10/01/2024)    Readmission Risk Interventions     No data to display
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
## 2024-10-12 NOTE — Progress Notes (Signed)
 Patient in soft restraints, right wrist. Restraint in place correctly. Regardless patient found with NG tube 6 cm out of place. Gently placed back to 65 cm, air pushed and heard in place after. Per policy, will get x-ray to verify placement. Feeds stopped until then.

## 2024-10-12 NOTE — Progress Notes (Signed)
 Abdominal xray showed NG tube in satisfactory condition for use.

## 2024-10-13 ENCOUNTER — Inpatient Hospital Stay (HOSPITAL_COMMUNITY): Payer: Self-pay | Admitting: Registered Nurse

## 2024-10-13 ENCOUNTER — Encounter (HOSPITAL_COMMUNITY): Payer: Self-pay

## 2024-10-13 DIAGNOSIS — Z4659 Encounter for fitting and adjustment of other gastrointestinal appliance and device: Secondary | ICD-10-CM | POA: Diagnosis not present

## 2024-10-13 HISTORY — PX: ESOPHAGOGASTRODUODENOSCOPY: SHX5428

## 2024-10-13 HISTORY — PX: PEG PLACEMENT: SHX5437

## 2024-10-13 LAB — GLUCOSE, CAPILLARY
Glucose-Capillary: 119 mg/dL — ABNORMAL HIGH (ref 70–99)
Glucose-Capillary: 136 mg/dL — ABNORMAL HIGH (ref 70–99)
Glucose-Capillary: 138 mg/dL — ABNORMAL HIGH (ref 70–99)
Glucose-Capillary: 147 mg/dL — ABNORMAL HIGH (ref 70–99)
Glucose-Capillary: 156 mg/dL — ABNORMAL HIGH (ref 70–99)
Glucose-Capillary: 172 mg/dL — ABNORMAL HIGH (ref 70–99)

## 2024-10-13 LAB — BASIC METABOLIC PANEL WITH GFR
Anion gap: 17 — ABNORMAL HIGH (ref 5–15)
BUN: 55 mg/dL — ABNORMAL HIGH (ref 8–23)
CO2: 17 mmol/L — ABNORMAL LOW (ref 22–32)
Calcium: 8.4 mg/dL — ABNORMAL LOW (ref 8.9–10.3)
Chloride: 112 mmol/L — ABNORMAL HIGH (ref 98–111)
Creatinine, Ser: 2.29 mg/dL — ABNORMAL HIGH (ref 0.61–1.24)
GFR, Estimated: 31 mL/min — ABNORMAL LOW
Glucose, Bld: 137 mg/dL — ABNORMAL HIGH (ref 70–99)
Potassium: 4.3 mmol/L (ref 3.5–5.1)
Sodium: 146 mmol/L — ABNORMAL HIGH (ref 135–145)

## 2024-10-13 LAB — MAGNESIUM: Magnesium: 2.2 mg/dL (ref 1.7–2.4)

## 2024-10-13 MED ORDER — OSMOLITE 1.5 CAL PO LIQD
1000.0000 mL | ORAL | Status: DC
  Administered 2024-10-13 – 2024-10-20 (×5): 1000 mL
  Filled 2024-10-13 (×2): qty 1000

## 2024-10-13 MED ADMIN — ORAL CARE MOUTH RINSE: 15 mL | OROMUCOSAL | NDC 99999080097

## 2024-10-13 MED ADMIN — Hydralazine HCl Tab 50 MG: 50 mg | NDC 23155083401

## 2024-10-13 MED ADMIN — Sennosides Tab 8.6 MG: 8.6 mg | NDC 00904725280

## 2024-10-13 MED ADMIN — Acetaminophen Tab 500 MG: 1000 mg | NDC 50580045711

## 2024-10-13 MED ADMIN — Insulin Aspart Inj Soln 100 Unit/ML: 3 [IU] | SUBCUTANEOUS | NDC 73070010011

## 2024-10-13 MED ADMIN — Insulin Aspart Inj Soln 100 Unit/ML: 4 [IU] | SUBCUTANEOUS | NDC 73070010011

## 2024-10-13 MED ADMIN — Polyethylene Glycol 3350 Oral Packet 17 GM: 17 g | NDC 00904693186

## 2024-10-13 MED ADMIN — Heparin Sodium (Porcine) Inj 5000 Unit/ML: 5000 [IU] | SUBCUTANEOUS | NDC 72572025501

## 2024-10-13 MED ADMIN — Glycopyrrolate Inj PF Soln Prefilled Syringe 0.2 MG/ML: .1 mg | INTRAVENOUS | NDC 76045020310

## 2024-10-13 MED ADMIN — B-Complex w/ C & Folic Acid Tab 0.8 MG: 1 | NDC 60258016001

## 2024-10-13 MED ADMIN — PROPOFOL 200 MG/20ML IV EMUL: 20 mg | INTRAVENOUS | NDC 00069020901

## 2024-10-13 MED ADMIN — PROPOFOL 200 MG/20ML IV EMUL: 40 mg | INTRAVENOUS | NDC 00069020901

## 2024-10-13 MED ADMIN — Oxycodone HCl Tab 5 MG: 5 mg | NDC 68084035411

## 2024-10-13 MED ADMIN — Propofol IV Emul 500 MG/50ML (10 MG/ML): 120 ug/kg/min | INTRAVENOUS | NDC 00069023420

## 2024-10-13 MED ADMIN — Lidocaine HCl Local Soln Prefilled Syringe 100 MG/5ML (2%): 80 mg | INTRAVENOUS | NDC 70004072309

## 2024-10-13 MED ADMIN — henylephrine-NaCl Pref Syr 0.8 MG/10ML-0.9% (80 MCG/ML): 160 ug | INTRAVENOUS | NDC 65302050510

## 2024-10-13 MED ADMIN — Cefazolin Sodium-Dextrose IV Solution 1 GM/50ML-4%: 1 g | INTRAVENOUS | NDC 00338350341

## 2024-10-13 MED FILL — Cefazolin Sodium-Dextrose IV Solution 1 GM/50ML-4%: 1.0000 g | INTRAVENOUS | Qty: 50 | Status: AC

## 2024-10-13 NOTE — Op Note (Signed)
 [facility] Patient Name: Scott Greer Procedure Date : 10/13/2024 MRN: [id number] Attending MD: Dann Hummer , MD,  Date of Birth: 02/27/1956 CSN: [id number] Age: 68 Admit Type: Inpatient Procedure:                Upper GI endoscopy Indications:              Place PEG because patient is unable to eat due to                            stroke (CVA) Providers:                Dann Hummer, MD, Vertell Pringle, PA-C, Marjorie Favre,                            PA-C, Mliss Eagles, RN, Haskel Chris, Technician Referring MD:              Medicines:                Monitored Anesthesia Care Complications:            No immediate complications. Estimated Blood Loss:     Estimated blood loss was minimal. Procedure:                Pre-Anesthesia Assessment:                           - Prior to the procedure, a History and Physical                            was performed, and patient medications and                            allergies were reviewed. The risks and benefits of                            the procedure and the sedation options and risks                            were discussed with the patient. Two physician                            consent was documented as he does not have capacity                            and has no family decision makers. Patient                            identification and proposed procedure were verified                            by the physician, the nurse, the anesthetist and                            the technician in the endoscopy suite. Mental  Status Examination: alert but confused. ASA Grade                            Assessment: III - A patient with severe systemic                            disease. After reviewing the risks and benefits,                            the patient was deemed in satisfactory condition to                            undergo the procedure. The anesthesia plan was to                             use monitored anesthesia care (MAC). Immediately                            prior to administration of medications, the patient                            was re-assessed for adequacy to receive sedatives.                            The heart rate, respiratory rate, oxygen                            saturations, blood pressure, adequacy of pulmonary                            ventilation, and response to care were monitored                            throughout the procedure. The physical status of                            the patient was re-assessed after the procedure.                           After obtaining informed consent, the endoscope was                            passed under direct vision. Throughout the                            procedure, the patient's blood pressure, pulse, and                            oxygen saturations were monitored continuously. The                            [id number]-[id number] ([id number]) Olympus endoscope was introduced  through the mouth, and advanced to the duodenal                            bulb. The upper GI endoscopy was accomplished                            without difficulty. The patient tolerated the                            procedure well. Scope In: Scope Out: Findings:      No gross lesions were noted in the esophagus.      No gross lesions were noted in the stomach. Placement of an externally       removable PEG with no T-fasteners was successfully completed. The       external bumper was at the 2.5 cm marking on the tube.      No gross lesions were noted in the duodenal bulb. Estimated blood loss       was minimal. Impression:               - No gross lesions in the esophagus.                           - No gross lesions in the stomach.                           - No gross lesions in the duodenal bulb.                           - An externally removable PEG placement was                             successfully completed.                           - No specimens collected. Recommendation:           - Please follow the post-PEG recommendations. 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 All rights reserved. The codes documented in this report are preliminary and upon coder review may  be revised to meet current compliance requirements. Dann Hummer, MD 10/13/2024 12:38:38 PM This report has been signed electronically. Number of Addenda: 0

## 2024-10-13 NOTE — Plan of Care (Signed)
  Problem: Clinical Measurements: Goal: Ability to maintain clinical measurements within normal limits will improve Outcome: Progressing Goal: Will remain free from infection Outcome: Progressing Goal: Diagnostic test results will improve Outcome: Progressing Goal: Respiratory complications will improve Outcome: Progressing Goal: Cardiovascular complication will be avoided Outcome: Progressing   Problem: Safety: Goal: Ability to remain free from injury will improve Outcome: Progressing   Problem: Skin Integrity: Goal: Risk for impaired skin integrity will decrease Outcome: Progressing   Problem: Education: Goal: Ability to describe self-care measures that may prevent or decrease complications (Diabetes Survival Skills Education) will improve Outcome: Progressing Goal: Individualized Educational Video(s) Outcome: Progressing   Problem: Coping: Goal: Ability to adjust to condition or change in health will improve Outcome: Progressing   Problem: Fluid Volume: Goal: Ability to maintain a balanced intake and output will improve Outcome: Progressing   Problem: Health Behavior/Discharge Planning: Goal: Ability to identify and utilize available resources and services will improve Outcome: Progressing Goal: Ability to manage health-related needs will improve Outcome: Progressing   Problem: Metabolic: Goal: Ability to maintain appropriate glucose levels will improve Outcome: Progressing   Problem: Nutritional: Goal: Maintenance of adequate nutrition will improve Outcome: Progressing Goal: Progress toward achieving an optimal weight will improve Outcome: Progressing   Problem: Skin Integrity: Goal: Risk for impaired skin integrity will decrease Outcome: Progressing   Problem: Tissue Perfusion: Goal: Adequacy of tissue perfusion will improve Outcome: Progressing   Problem: Education: Goal: Verbalization of understanding the information provided (i.e., activity  precautions, restrictions, etc) will improve Outcome: Progressing Goal: Individualized Educational Video(s) Outcome: Progressing   Problem: Activity: Goal: Ability to ambulate and perform ADLs will improve Outcome: Progressing   Problem: Clinical Measurements: Goal: Postoperative complications will be avoided or minimized Outcome: Progressing   Problem: Self-Concept: Goal: Ability to maintain and perform role responsibilities to the fullest extent possible will improve Outcome: Progressing   Problem: Pain Management: Goal: Pain level will decrease Outcome: Progressing   Problem: Education: Goal: Verbalization of understanding the information provided (i.e., activity precautions, restrictions, etc) will improve Outcome: Progressing Goal: Individualized Educational Video(s) Outcome: Progressing   Problem: Activity: Goal: Ability to ambulate and perform ADLs will improve Outcome: Progressing   Problem: Clinical Measurements: Goal: Postoperative complications will be avoided or minimized Outcome: Progressing   Problem: Self-Concept: Goal: Ability to maintain and perform role responsibilities to the fullest extent possible will improve Outcome: Progressing   Problem: Pain Management: Goal: Pain level will decrease Outcome: Progressing   Problem: Education: Goal: Ability to describe self-care measures that may prevent or decrease complications (Diabetes Survival Skills Education) will improve Outcome: Progressing Goal: Individualized Educational Video(s) Outcome: Progressing   Problem: Coping: Goal: Ability to adjust to condition or change in health will improve Outcome: Progressing   Problem: Fluid Volume: Goal: Ability to maintain a balanced intake and output will improve Outcome: Progressing   Problem: Health Behavior/Discharge Planning: Goal: Ability to identify and utilize available resources and services will improve Outcome: Progressing Goal: Ability to  manage health-related needs will improve Outcome: Progressing   Problem: Metabolic: Goal: Ability to maintain appropriate glucose levels will improve Outcome: Progressing   Problem: Nutritional: Goal: Maintenance of adequate nutrition will improve Outcome: Progressing Goal: Progress toward achieving an optimal weight will improve Outcome: Progressing   Problem: Skin Integrity: Goal: Risk for impaired skin integrity will decrease Outcome: Progressing   Problem: Tissue Perfusion: Goal: Adequacy of tissue perfusion will improve Outcome: Progressing
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
## 2024-10-13 NOTE — Anesthesia Preprocedure Evaluation (Addendum)
 Anesthesia Evaluation  Patient identified by MRN, date of birth, ID band Patient confused    Reviewed: Patient's Chart, lab work & pertinent test results, Unable to perform ROS - Chart review only  History of Anesthesia Complications Negative for: history of anesthetic complications  Airway        Dental   Pulmonary           Cardiovascular      Neuro/Psych    GI/Hepatic   Endo/Other    Renal/GU Renal InsufficiencyRenal diseaseLab Results      Component                Value               Date                      NA                       146 (H)             10/13/2024                K                        4.3                 10/13/2024                CO2                      17 (L)              10/13/2024                GLUCOSE                  137 (H)             10/13/2024                BUN                      55 (H)              10/13/2024                CREATININE               2.29 (H)            10/13/2024                CALCIUM                   8.4 (L)             10/13/2024                GFRNONAA                 31 (L)              10/13/2024                Musculoskeletal   Abdominal   Peds  Hematology  (+) Blood dyscrasia, anemia Lab Results      Component                Value  Date                      WBC                      12.9 (H)            10/11/2024                HGB                      8.8 (L)             10/11/2024                HCT                      28.1 (L)            10/11/2024                MCV                      87.3                10/11/2024                PLT                      452 (H)             10/11/2024              Anesthesia Other Findings   Reproductive/Obstetrics                              Anesthesia Physical Anesthesia Plan  ASA: 4  Anesthesia Plan: General   Post-op Pain Management: Minimal or no pain  anticipated   Induction: Intravenous and Rapid sequence  PONV Risk Score and Plan: 2 and Ondansetron  and Dexamethasone   Airway Management Planned: Oral ETT  Additional Equipment: None  Intra-op Plan:   Post-operative Plan: Extubation in OR  Informed Consent:      History available from chart only  Plan Discussed with: CRNA  Anesthesia Plan Comments:         Anesthesia Quick Evaluation

## 2024-10-13 NOTE — Progress Notes (Signed)
 OT Cancellation Note  Patient Details Name: Scott Greer MRN: [id number] DOB: 10-12-1956   Cancelled Treatment:    Reason Eval/Treat Not Completed: Patient at procedure or test/ unavailable (off unit for PEG placement)  Larz Mark K, OTD, OTR/L SecureChat [facility] [phone]   Madason Rauls K Koonce 10/13/2024, 11:02 AM
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
## 2024-10-13 NOTE — Progress Notes (Signed)
 SLP Cancellation Note  Patient Details Name: Scott Greer MRN: [id number] DOB: 1956/09/18   Cancelled treatment:        Attempted to see pt for ongoing dysphagia management and cognitive-linguistic assessment.  Spoke with RN.  Pt planned for PEG placement today, holding TF since midnight.  Pt is not appropriate for PO trials at this time. RN reports some improvement in LOA; however, suspects anesthesia for procedure may make pt more somnolent following PEG placement.  SLP will follow for readiness for further evaluation and treatment.   Anette FORBES Grippe, MA, CCC-SLP Acute Rehabilitation Services Office: [phone] 10/13/2024, 8:55 AM

## 2024-10-13 NOTE — Progress Notes (Signed)
 Transition of Care [facility]) - CAGE-AID Screening   Patient Details  Name: Scott Greer MRN: [id number] Date of Birth: 1956/09/01  Transition [facility]) CM/SW Contact:    Sallyanne MALVA Mettle, RN Phone Number: 10/13/2024, 8:22 PM    CAGE-AID Screening: Substance Abuse Screening unable to be completed due to: : Patient unable to participate (confused, unable to report his location)    Pt remains confused, unable to tell me his name or situation on TRN rounds tonight. Not appropriate for assessment for CAGEAID/ITSS.

## 2024-10-13 NOTE — Transfer of Care (Signed)
 Immediate Anesthesia Transfer of Care Note  Patient: Scott Greer  Procedure(s) Performed: EGD (ESOPHAGOGASTRODUODENOSCOPY) INSERTION, PEG TUBE  Patient Location: PACU  Anesthesia Type:MAC  Level of Consciousness: awake  Airway & Oxygen Therapy: Patient Spontanous Breathing  Post-op Assessment: Report given to RN and Post -op Vital signs reviewed and stable  Post vital signs: Reviewed and stable  Last Vitals:  Vitals Value Taken Time  BP 143/81 10/13/24 12:35  Temp 36.4 C 10/13/24 12:35  Pulse 90 10/13/24 12:37  Resp 25 10/13/24 12:37  SpO2 95 % 10/13/24 12:37  Vitals shown include unfiled device data.  Last Pain:  Vitals:   10/13/24 1235  TempSrc: Temporal  PainSc:       Patients Stated Pain Goal: 0 (10/12/24 2358)  Complications: No notable events documented.

## 2024-10-14 ENCOUNTER — Encounter (HOSPITAL_COMMUNITY): Payer: Self-pay

## 2024-10-14 LAB — GLUCOSE, CAPILLARY
Glucose-Capillary: 206 mg/dL — ABNORMAL HIGH (ref 70–99)
Glucose-Capillary: 182 mg/dL — ABNORMAL HIGH (ref 70–99)
Glucose-Capillary: 182 mg/dL — ABNORMAL HIGH (ref 70–99)
Glucose-Capillary: 214 mg/dL — ABNORMAL HIGH (ref 70–99)
Glucose-Capillary: 232 mg/dL — ABNORMAL HIGH (ref 70–99)
Glucose-Capillary: 192 mg/dL — ABNORMAL HIGH (ref 70–99)
Glucose-Capillary: 130 mg/dL — ABNORMAL HIGH (ref 70–99)

## 2024-10-14 LAB — BASIC METABOLIC PANEL WITH GFR
Anion gap: 8 (ref 5–15)
BUN: 53 mg/dL — ABNORMAL HIGH (ref 8–23)
CO2: 24 mmol/L (ref 22–32)
Calcium: 8.1 mg/dL — ABNORMAL LOW (ref 8.9–10.3)
Chloride: 114 mmol/L — ABNORMAL HIGH (ref 98–111)
Creatinine, Ser: 2.5 mg/dL — ABNORMAL HIGH (ref 0.61–1.24)
GFR, Estimated: 28 mL/min — ABNORMAL LOW
Glucose, Bld: 176 mg/dL — ABNORMAL HIGH (ref 70–99)
Potassium: 4.3 mmol/L (ref 3.5–5.1)
Sodium: 146 mmol/L — ABNORMAL HIGH (ref 135–145)

## 2024-10-14 LAB — MAGNESIUM: Magnesium: 2.4 mg/dL (ref 1.7–2.4)

## 2024-10-14 MED ADMIN — ORAL CARE MOUTH RINSE: 15 mL | OROMUCOSAL | NDC 99999080097

## 2024-10-14 MED ADMIN — Hydralazine HCl Tab 50 MG: 50 mg | NDC 60687083311

## 2024-10-14 MED ADMIN — Hydralazine HCl Tab 50 MG: 50 mg | NDC 23155083401

## 2024-10-14 MED ADMIN — Sennosides Tab 8.6 MG: 8.6 mg | NDC 00904725280

## 2024-10-14 MED ADMIN — Insulin Aspart Inj Soln 100 Unit/ML: 3 [IU] | SUBCUTANEOUS | NDC 73070010011

## 2024-10-14 MED ADMIN — Acetaminophen Tab 500 MG: 1000 mg | NDC 50580045711

## 2024-10-14 MED ADMIN — Insulin Aspart Inj Soln 100 Unit/ML: 4 [IU] | SUBCUTANEOUS | NDC 73070010011

## 2024-10-14 MED ADMIN — Insulin Aspart Inj Soln 100 Unit/ML: 7 [IU] | SUBCUTANEOUS | NDC 73070010011

## 2024-10-14 MED ADMIN — Heparin Sodium (Porcine) Inj 5000 Unit/ML: 5000 [IU] | SUBCUTANEOUS | NDC 72572025501

## 2024-10-14 MED ADMIN — Water For Irrigation, Sterile Irrigation Soln: 200 mL | NDC 99999080061

## 2024-10-14 MED ADMIN — B-Complex w/ C & Folic Acid Tab 0.8 MG: 1 | NDC 60258016001

## 2024-10-14 MED ADMIN — Amlodipine Besylate Tab 10 MG (Base Equivalent): 10 mg | NDC 00904637161

## 2024-10-14 MED ADMIN — Oxycodone HCl Tab 5 MG: 5 mg | NDC 68084035411

## 2024-10-14 MED ADMIN — Aspirin Chew Tab 81 MG: 81 mg | NDC 00904679480
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
## 2024-10-14 NOTE — Progress Notes (Signed)
°   10/14/24 1223  Height and Weight  Weight 81 kg  BMI (Calculated) 24.21
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
## 2024-10-14 NOTE — Progress Notes (Signed)
 Spoke with Dr Cordella Idler, MD concerning the 2 insulin  orders. He stated to used the sliding scale for patient's insulin  coverage. Also stated to leave patient NPO for now.

## 2024-10-14 NOTE — TOC Progression Note (Addendum)
 Transition of Care [facility]) - Progression Note    Patient Details  Name: Jory Tanguma MRN: [id number] Date of Birth: 1956/04/16  [facility] CM/SW Contact  Carmelita FORBES Carbon, LCSW Phone Number: 10/14/2024, 9:55 AM  Clinical Narrative:    Patient now has PEG. Can send out for SNF once he has been restraint free 24-48 hours.   DSS continues to work on potential guardianship. CSW called APS Worker Keoda who states she is staffing his case with the Supervisor today and will have more updates after that. [name] recommends reaching out to the friend Julleen that was added to the chart to see if she can provide any support post rehab.  CSW called Julleen. Julleen states she was just helping patient, has known him for 2 months, but would not be a support that patient could stay with after rehab. Julleen states she got patient in touch with a child psychotherapist through DSS who had helped patient to find a place to live but patient declined since he gets $900 a month and was going to have to live off of about $50 a month after paying the rent.      Barriers to Discharge: Continued Medical Work up               Expected Discharge Plan and Services       Living arrangements for the past 2 months: Homeless                                       Social Drivers of Health (SDOH) Interventions SDOH Screenings   Food Insecurity: Food Insecurity Present (10/01/2024)  Housing: High Risk (10/01/2024)    Readmission Risk Interventions     No data to display
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
## 2024-10-14 NOTE — Care Management Important Message (Signed)
 Important Message  Patient Details  Name: Scott Greer MRN: [id number] Date of Birth: [birth year]   Important Message Given:  Yes - Medicare IM     Jennie Laneta Dragon 10/14/2024, 11:33 AM
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
## 2024-10-14 NOTE — Progress Notes (Signed)
 Soft restraints removed and hand mitts left in place. Patient appears less agitated with mitts only. At this time patient is resting with eyes closed. Still unable to verbalize his name, date of birth or where he is at this time. Sometimes will nod yes or no when questioned about his comfort. (Are you hot/cold, are you in pain, etc)

## 2024-10-15 ENCOUNTER — Encounter (HOSPITAL_COMMUNITY): Payer: Self-pay

## 2024-10-15 LAB — BASIC METABOLIC PANEL WITH GFR
Anion gap: 10 (ref 5–15)
BUN: 61 mg/dL — ABNORMAL HIGH (ref 8–23)
CO2: 24 mmol/L (ref 22–32)
Calcium: 8.3 mg/dL — ABNORMAL LOW (ref 8.9–10.3)
Chloride: 114 mmol/L — ABNORMAL HIGH (ref 98–111)
Creatinine, Ser: 2.24 mg/dL — ABNORMAL HIGH (ref 0.61–1.24)
GFR, Estimated: 32 mL/min — ABNORMAL LOW
Glucose, Bld: 171 mg/dL — ABNORMAL HIGH (ref 70–99)
Potassium: 4.1 mmol/L (ref 3.5–5.1)
Sodium: 148 mmol/L — ABNORMAL HIGH (ref 135–145)

## 2024-10-15 LAB — GLUCOSE, CAPILLARY
Glucose-Capillary: 138 mg/dL — ABNORMAL HIGH (ref 70–99)
Glucose-Capillary: 139 mg/dL — ABNORMAL HIGH (ref 70–99)
Glucose-Capillary: 147 mg/dL — ABNORMAL HIGH (ref 70–99)
Glucose-Capillary: 181 mg/dL — ABNORMAL HIGH (ref 70–99)
Glucose-Capillary: 193 mg/dL — ABNORMAL HIGH (ref 70–99)
Glucose-Capillary: 242 mg/dL — ABNORMAL HIGH (ref 70–99)

## 2024-10-15 LAB — MAGNESIUM: Magnesium: 2.5 mg/dL — ABNORMAL HIGH (ref 1.7–2.4)

## 2024-10-15 MED ADMIN — ORAL CARE MOUTH RINSE: 15 mL | OROMUCOSAL | NDC 99999080097

## 2024-10-15 MED ADMIN — Hydralazine HCl Tab 50 MG: 50 mg | NDC 23155083401

## 2024-10-15 MED ADMIN — Sennosides Tab 8.6 MG: 8.6 mg | NDC 00904725280

## 2024-10-15 MED ADMIN — Insulin Aspart Inj Soln 100 Unit/ML: 3 [IU] | SUBCUTANEOUS | NDC 73070010011

## 2024-10-15 MED ADMIN — Acetaminophen Tab 500 MG: 1000 mg | NDC 50580045711

## 2024-10-15 MED ADMIN — Insulin Aspart Inj Soln 100 Unit/ML: 4 [IU] | SUBCUTANEOUS | NDC 73070010011

## 2024-10-15 MED ADMIN — Insulin Aspart Inj Soln 100 Unit/ML: 7 [IU] | SUBCUTANEOUS | NDC 73070010011

## 2024-10-15 MED ADMIN — Polyethylene Glycol 3350 Oral Packet 17 GM: 17 g | NDC 00904693186

## 2024-10-15 MED ADMIN — Heparin Sodium (Porcine) Inj 5000 Unit/ML: 5000 [IU] | SUBCUTANEOUS | NDC 72572025501

## 2024-10-15 MED ADMIN — Water For Irrigation, Sterile Irrigation Soln: 200 mL | NDC 99999080061

## 2024-10-15 MED ADMIN — B-Complex w/ C & Folic Acid Tab 0.8 MG: 1 | NDC 60258016001

## 2024-10-15 MED ADMIN — Amlodipine Besylate Tab 10 MG (Base Equivalent): 10 mg | NDC 00904637161

## 2024-10-15 MED ADMIN — Darbepoetin Alfa Soln Prefilled Syringe 40 MCG/0.4ML: 40 ug | SUBCUTANEOUS | NDC 55513002101

## 2024-10-15 MED ADMIN — Oxycodone HCl Tab 5 MG: 5 mg | NDC 68084035411

## 2024-10-15 MED ADMIN — Aspirin Chew Tab 81 MG: 81 mg | NDC 00904679480

## 2024-10-15 NOTE — Progress Notes (Signed)
°   10/15/24 0856  Vitals  BP 134/69  MAP (mmHg) 84  BP Location Right Arm  BP Method Automatic  Patient Position (if appropriate) Lying  Pulse Rate Source Dinamap  Level of Consciousness  Level of Consciousness Alert  MEWS COLOR  MEWS Score Color Green  Oxygen Therapy  SpO2 95 %  O2 Device Room Air  Pain Assessment  Pain Scale Faces  Pain Score 2  MEWS Score  MEWS Temp 0  MEWS Systolic 0  MEWS Pulse 0  MEWS RR 0  MEWS LOC 0  MEWS Score 0

## 2024-10-15 NOTE — Plan of Care (Signed)
  Problem: Safety: Goal: Ability to remain free from injury will improve Outcome: Progressing   Problem: Fluid Volume: Goal: Ability to maintain a balanced intake and output will improve Outcome: Progressing
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
## 2024-10-15 NOTE — TOC Progression Note (Addendum)
 Transition of Care [facility]) - Progression Note    Patient Details  Name: Scott Greer MRN: [id number] Date of Birth: 1956/01/06  Transition of [facility] CM/SW Contact  Myosha Cuadras E Geanette Buonocore, LCSW Phone Number: 10/15/2024, 9:30 AM  Clinical Narrative:    Spoke with PA, will try to wean restraints so that patient can be sent out for SNF & AIR. Not eligible for [facility] Re[facility].   CSW reached out to APS Worker [place] and requested updates.    Barriers to Discharge: Continued Medical Work up               Expected Discharge Plan and Services       Living arrangements for the past 2 months: Homeless                                       Social Drivers of Health (SDOH) Interventions SDOH Screenings   Food Insecurity: Food Insecurity Present (10/01/2024)  Housing: High Risk (10/01/2024)    Readmission Risk Interventions     No data to display

## 2024-10-16 ENCOUNTER — Encounter (HOSPITAL_COMMUNITY): Payer: Self-pay

## 2024-10-16 LAB — GLUCOSE, CAPILLARY
Glucose-Capillary: 185 mg/dL — ABNORMAL HIGH (ref 70–99)
Glucose-Capillary: 239 mg/dL — ABNORMAL HIGH (ref 70–99)
Glucose-Capillary: 184 mg/dL — ABNORMAL HIGH (ref 70–99)
Glucose-Capillary: 113 mg/dL — ABNORMAL HIGH (ref 70–99)
Glucose-Capillary: 271 mg/dL — ABNORMAL HIGH (ref 70–99)
Glucose-Capillary: 134 mg/dL — ABNORMAL HIGH (ref 70–99)

## 2024-10-16 LAB — BASIC METABOLIC PANEL WITH GFR
Anion gap: 8 (ref 5–15)
BUN: 67 mg/dL — ABNORMAL HIGH (ref 8–23)
CO2: 23 mmol/L (ref 22–32)
Calcium: 8.6 mg/dL — ABNORMAL LOW (ref 8.9–10.3)
Chloride: 121 mmol/L — ABNORMAL HIGH (ref 98–111)
Creatinine, Ser: 2.33 mg/dL — ABNORMAL HIGH (ref 0.61–1.24)
GFR, Estimated: 30 mL/min — ABNORMAL LOW
Glucose, Bld: 218 mg/dL — ABNORMAL HIGH (ref 70–99)
Potassium: 4.3 mmol/L (ref 3.5–5.1)
Sodium: 152 mmol/L — ABNORMAL HIGH (ref 135–145)

## 2024-10-16 LAB — MAGNESIUM: Magnesium: 2.7 mg/dL — ABNORMAL HIGH (ref 1.7–2.4)

## 2024-10-16 MED ADMIN — ORAL CARE MOUTH RINSE: 15 mL | OROMUCOSAL | NDC 99999080097

## 2024-10-16 MED ADMIN — Hydralazine HCl Tab 50 MG: 50 mg | NDC 23155083401

## 2024-10-16 MED ADMIN — Sennosides Tab 8.6 MG: 8.6 mg | NDC 00904725280

## 2024-10-16 MED ADMIN — Insulin Aspart Inj Soln 100 Unit/ML: 3 [IU] | SUBCUTANEOUS | NDC 73070010011

## 2024-10-16 MED ADMIN — Acetaminophen Tab 500 MG: 1000 mg | NDC 50580045711

## 2024-10-16 MED ADMIN — Insulin Aspart Inj Soln 100 Unit/ML: 11 [IU] | SUBCUTANEOUS | NDC 73070010011

## 2024-10-16 MED ADMIN — Insulin Aspart Inj Soln 100 Unit/ML: 7 [IU] | SUBCUTANEOUS | NDC 73070010011

## 2024-10-16 MED ADMIN — Insulin Aspart Inj Soln 100 Unit/ML: 4 [IU] | SUBCUTANEOUS | NDC 73070010011

## 2024-10-16 MED ADMIN — Polyethylene Glycol 3350 Oral Packet 17 GM: 17 g | NDC 00904693186

## 2024-10-16 MED ADMIN — Heparin Sodium (Porcine) Inj 5000 Unit/ML: 5000 [IU] | SUBCUTANEOUS | NDC 72572025501

## 2024-10-16 MED ADMIN — Water For Irrigation, Sterile Irrigation Soln: 200 mL | NDC 99999080061

## 2024-10-16 MED ADMIN — B-Complex w/ C & Folic Acid Tab 0.8 MG: 1 | NDC 60258016001

## 2024-10-16 MED ADMIN — Lidocaine Patch 5%: 2 | TRANSDERMAL | NDC 65162079104

## 2024-10-16 MED ADMIN — Amlodipine Besylate Tab 10 MG (Base Equivalent): 10 mg | NDC 00904637161

## 2024-10-16 MED ADMIN — Oxycodone HCl Tab 5 MG: 5 mg | NDC 68084035411

## 2024-10-16 MED ADMIN — Aspirin Chew Tab 81 MG: 81 mg | NDC 00904679480

## 2024-10-16 NOTE — Anesthesia Postprocedure Evaluation (Signed)
 Anesthesia Post Note  Patient: Scott Greer  Procedure(s) Performed: EGD (ESOPHAGOGASTRODUODENOSCOPY) INSERTION, PEG TUBE     Patient location during evaluation: PACU Anesthesia Type: MAC Level of consciousness: patient cooperative Pain management: pain level controlled Vital Signs Assessment: post-procedure vital signs reviewed and stable Respiratory status: spontaneous breathing, nonlabored ventilation, respiratory function stable and patient connected to nasal cannula oxygen Cardiovascular status: blood pressure returned to baseline and stable Postop Assessment: no apparent nausea or vomiting Anesthetic complications: no   No notable events documented.                [place]
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
## 2024-10-17 LAB — BASIC METABOLIC PANEL WITH GFR
Anion gap: 7 (ref 5–15)
BUN: 67 mg/dL — ABNORMAL HIGH (ref 8–23)
CO2: 23 mmol/L (ref 22–32)
Calcium: 8.4 mg/dL — ABNORMAL LOW (ref 8.9–10.3)
Chloride: 120 mmol/L — ABNORMAL HIGH (ref 98–111)
Creatinine, Ser: 2.28 mg/dL — ABNORMAL HIGH (ref 0.61–1.24)
GFR, Estimated: 31 mL/min — ABNORMAL LOW
Glucose, Bld: 199 mg/dL — ABNORMAL HIGH (ref 70–99)
Potassium: 4.8 mmol/L (ref 3.5–5.1)
Sodium: 150 mmol/L — ABNORMAL HIGH (ref 135–145)

## 2024-10-17 LAB — GLUCOSE, CAPILLARY
Glucose-Capillary: 126 mg/dL — ABNORMAL HIGH (ref 70–99)
Glucose-Capillary: 129 mg/dL — ABNORMAL HIGH (ref 70–99)
Glucose-Capillary: 150 mg/dL — ABNORMAL HIGH (ref 70–99)
Glucose-Capillary: 155 mg/dL — ABNORMAL HIGH (ref 70–99)
Glucose-Capillary: 162 mg/dL — ABNORMAL HIGH (ref 70–99)
Glucose-Capillary: 163 mg/dL — ABNORMAL HIGH (ref 70–99)

## 2024-10-17 LAB — MAGNESIUM: Magnesium: 2.8 mg/dL — ABNORMAL HIGH (ref 1.7–2.4)

## 2024-10-17 MED ADMIN — ORAL CARE MOUTH RINSE: 15 mL | OROMUCOSAL | NDC 99999080097

## 2024-10-17 MED ADMIN — Hydralazine HCl Tab 50 MG: 50 mg | NDC 23155083401

## 2024-10-17 MED ADMIN — Sennosides Tab 8.6 MG: 8.6 mg | NDC 00904725280

## 2024-10-17 MED ADMIN — Insulin Aspart Inj Soln 100 Unit/ML: 3 [IU] | SUBCUTANEOUS | NDC 73070010011

## 2024-10-17 MED ADMIN — Acetaminophen Tab 500 MG: 1000 mg | NDC 50580045711

## 2024-10-17 MED ADMIN — Insulin Aspart Inj Soln 100 Unit/ML: 4 [IU] | SUBCUTANEOUS | NDC 73070010011

## 2024-10-17 MED ADMIN — Polyethylene Glycol 3350 Oral Packet 17 GM: 17 g | NDC 00904693186

## 2024-10-17 MED ADMIN — Heparin Sodium (Porcine) Inj 5000 Unit/ML: 5000 [IU] | SUBCUTANEOUS | NDC 72572025501

## 2024-10-17 MED ADMIN — Water For Irrigation, Sterile Irrigation Soln: 200 mL | NDC 99999080061

## 2024-10-17 MED ADMIN — B-Complex w/ C & Folic Acid Tab 0.8 MG: 1 | NDC 60258016001

## 2024-10-17 MED ADMIN — Lidocaine Patch 5%: 2 | TRANSDERMAL | NDC 82347050504

## 2024-10-17 MED ADMIN — Amlodipine Besylate Tab 10 MG (Base Equivalent): 10 mg | NDC 00904637161

## 2024-10-17 MED ADMIN — Oxycodone HCl Tab 5 MG: 10 mg | NDC 10702001850

## 2024-10-17 MED ADMIN — Oxycodone HCl Tab 5 MG: 10 mg | NDC 68084035411

## 2024-10-17 MED ADMIN — Aspirin Chew Tab 81 MG: 81 mg | NDC 00904679480

## 2024-10-17 MED ADMIN — Bisacodyl Suppos 10 MG: 10 mg | RECTAL | NDC 00574705012

## 2024-10-17 MED FILL — Bisacodyl Suppos 10 MG: 10.0000 mg | RECTAL | Qty: 1 | Status: AC

## 2024-10-17 NOTE — Plan of Care (Signed)
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
## 2024-10-17 NOTE — Progress Notes (Signed)
 LBM documented was 12/9, prn suppository administered

## 2024-10-18 LAB — GLUCOSE, CAPILLARY
Glucose-Capillary: 114 mg/dL — ABNORMAL HIGH (ref 70–99)
Glucose-Capillary: 133 mg/dL — ABNORMAL HIGH (ref 70–99)
Glucose-Capillary: 142 mg/dL — ABNORMAL HIGH (ref 70–99)
Glucose-Capillary: 158 mg/dL — ABNORMAL HIGH (ref 70–99)
Glucose-Capillary: 172 mg/dL — ABNORMAL HIGH (ref 70–99)
Glucose-Capillary: 175 mg/dL — ABNORMAL HIGH (ref 70–99)

## 2024-10-18 LAB — BASIC METABOLIC PANEL WITH GFR
Anion gap: 13 (ref 5–15)
BUN: 73 mg/dL — ABNORMAL HIGH (ref 8–23)
CO2: 23 mmol/L (ref 22–32)
Calcium: 8.1 mg/dL — ABNORMAL LOW (ref 8.9–10.3)
Chloride: 111 mmol/L (ref 98–111)
Creatinine, Ser: 2.3 mg/dL — ABNORMAL HIGH (ref 0.61–1.24)
GFR, Estimated: 31 mL/min — ABNORMAL LOW
Glucose, Bld: 136 mg/dL — ABNORMAL HIGH (ref 70–99)
Potassium: 4.4 mmol/L (ref 3.5–5.1)
Sodium: 147 mmol/L — ABNORMAL HIGH (ref 135–145)

## 2024-10-18 LAB — MAGNESIUM: Magnesium: 2.5 mg/dL — ABNORMAL HIGH (ref 1.7–2.4)

## 2024-10-18 MED ADMIN — ORAL CARE MOUTH RINSE: 15 mL | OROMUCOSAL | NDC 99999080097

## 2024-10-18 MED ADMIN — Hydralazine HCl Tab 50 MG: 50 mg | NDC 23155083401

## 2024-10-18 MED ADMIN — Sennosides Tab 8.6 MG: 8.6 mg | NDC 00904725280

## 2024-10-18 MED ADMIN — Insulin Aspart Inj Soln 100 Unit/ML: 3 [IU] | SUBCUTANEOUS | NDC 73070010011

## 2024-10-18 MED ADMIN — Acetaminophen Tab 500 MG: 1000 mg | NDC 50580045711

## 2024-10-18 MED ADMIN — Insulin Aspart Inj Soln 100 Unit/ML: 4 [IU] | SUBCUTANEOUS | NDC 73070010011

## 2024-10-18 MED ADMIN — Polyethylene Glycol 3350 Oral Packet 17 GM: 17 g | NDC 00904693186

## 2024-10-18 MED ADMIN — Heparin Sodium (Porcine) Inj 5000 Unit/ML: 5000 [IU] | SUBCUTANEOUS | NDC 72572025501

## 2024-10-18 MED ADMIN — Water For Irrigation, Sterile Irrigation Soln: 200 mL | NDC 99999080061

## 2024-10-18 MED ADMIN — B-Complex w/ C & Folic Acid Tab 0.8 MG: 1 | NDC 60258016001

## 2024-10-18 MED ADMIN — Lidocaine Patch 5%: 2 | TRANSDERMAL | NDC 65162079104

## 2024-10-18 MED ADMIN — Amlodipine Besylate Tab 10 MG (Base Equivalent): 10 mg | NDC 00904637161

## 2024-10-18 MED ADMIN — Oxycodone HCl Tab 5 MG: 10 mg | NDC 10702001850

## 2024-10-18 MED ADMIN — Aspirin Chew Tab 81 MG: 81 mg | NDC 00904679480
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
## 2024-10-19 LAB — GLUCOSE, CAPILLARY
Glucose-Capillary: 126 mg/dL — ABNORMAL HIGH (ref 70–99)
Glucose-Capillary: 128 mg/dL — ABNORMAL HIGH (ref 70–99)
Glucose-Capillary: 133 mg/dL — ABNORMAL HIGH (ref 70–99)
Glucose-Capillary: 139 mg/dL — ABNORMAL HIGH (ref 70–99)
Glucose-Capillary: 143 mg/dL — ABNORMAL HIGH (ref 70–99)
Glucose-Capillary: 162 mg/dL — ABNORMAL HIGH (ref 70–99)

## 2024-10-19 LAB — MAGNESIUM: Magnesium: 2.5 mg/dL — ABNORMAL HIGH (ref 1.7–2.4)

## 2024-10-19 MED ADMIN — ORAL CARE MOUTH RINSE: 15 mL | OROMUCOSAL | NDC 99999080097

## 2024-10-19 MED ADMIN — Hydralazine HCl Tab 50 MG: 50 mg | NDC 23155083401

## 2024-10-19 MED ADMIN — Sennosides Tab 8.6 MG: 8.6 mg | NDC 00904725280

## 2024-10-19 MED ADMIN — Insulin Aspart Inj Soln 100 Unit/ML: 3 [IU] | SUBCUTANEOUS | NDC 73070010011

## 2024-10-19 MED ADMIN — Acetaminophen Tab 500 MG: 1000 mg | NDC 50580045711

## 2024-10-19 MED ADMIN — Insulin Aspart Inj Soln 100 Unit/ML: 4 [IU] | SUBCUTANEOUS | NDC 73070010011

## 2024-10-19 MED ADMIN — Polyethylene Glycol 3350 Oral Packet 17 GM: 17 g | NDC 00904693186

## 2024-10-19 MED ADMIN — Heparin Sodium (Porcine) Inj 5000 Unit/ML: 5000 [IU] | SUBCUTANEOUS | NDC 72572025501

## 2024-10-19 MED ADMIN — Water For Irrigation, Sterile Irrigation Soln: 200 mL | NDC 99999080061

## 2024-10-19 MED ADMIN — B-Complex w/ C & Folic Acid Tab 0.8 MG: 1 | NDC 60258016001

## 2024-10-19 MED ADMIN — Lidocaine Patch 5%: 2 | TRANSDERMAL | NDC 65162079104

## 2024-10-19 MED ADMIN — Amlodipine Besylate Tab 10 MG (Base Equivalent): 10 mg | NDC 00904637161

## 2024-10-19 MED ADMIN — Quetiapine Fumarate Tab 50 MG: 50 mg | ORAL | NDC 67877024901

## 2024-10-19 MED ADMIN — Oxycodone HCl Tab 5 MG: 5 mg | NDC 10702001850

## 2024-10-19 MED ADMIN — Oxycodone HCl Tab 5 MG: 10 mg | NDC 10702001850

## 2024-10-19 MED ADMIN — Aspirin Chew Tab 81 MG: 81 mg | NDC 00904679480

## 2024-10-19 MED FILL — Quetiapine Fumarate Tab 50 MG: 50.0000 mg | ORAL | Qty: 1 | Status: AC

## 2024-10-19 NOTE — TOC Progression Note (Signed)
 Transition of Care [facility]) - Progression Note    Patient Details  Name: Scott Greer MRN: [id number] Date of Birth: 1956/06/12  [facility]) CM/SW Contact  Ashlon Lottman E Deedra Pro, LCSW Phone Number: 10/19/2024, 3:36 PM  Clinical Narrative:    CSW reached out to DSS Worker [facility] and requested an update.      Barriers to Discharge: Continued Medical Work up               Expected Discharge Plan and Services       Living arrangements for the past 2 months: Homeless                                       Social Drivers of Health (SDOH) Interventions SDOH Screenings   Food Insecurity: Food Insecurity Present (10/01/2024)  Housing: High Risk (10/01/2024)  Tobacco Use: Low Risk (10/16/2024)    Readmission Risk Interventions     No data to display
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
## 2024-10-19 NOTE — Plan of Care (Signed)
°  Problem: Safety: Goal: Ability to remain free from injury will improve Outcome: Progressing   Problem: Fluid Volume: Goal: Ability to maintain a balanced intake and output will improve Outcome: Progressing   Problem: Fluid Volume: Goal: Ability to maintain a balanced intake and output will improve Outcome: Progressing

## 2024-10-20 ENCOUNTER — Inpatient Hospital Stay (HOSPITAL_COMMUNITY)

## 2024-10-20 LAB — BASIC METABOLIC PANEL WITH GFR
Anion gap: 11 (ref 5–15)
BUN: 62 mg/dL — ABNORMAL HIGH (ref 8–23)
CO2: 20 mmol/L — ABNORMAL LOW (ref 22–32)
Calcium: 8.4 mg/dL — ABNORMAL LOW (ref 8.9–10.3)
Chloride: 110 mmol/L (ref 98–111)
Creatinine, Ser: 1.97 mg/dL — ABNORMAL HIGH (ref 0.61–1.24)
GFR, Estimated: 37 mL/min — ABNORMAL LOW
Glucose, Bld: 149 mg/dL — ABNORMAL HIGH (ref 70–99)
Potassium: 4.6 mmol/L (ref 3.5–5.1)
Sodium: 141 mmol/L (ref 135–145)

## 2024-10-20 LAB — CBC
HCT: 26 % — ABNORMAL LOW (ref 39.0–52.0)
Hemoglobin: 8.1 g/dL — ABNORMAL LOW (ref 13.0–17.0)
MCH: 27.2 pg (ref 26.0–34.0)
MCHC: 31.2 g/dL (ref 30.0–36.0)
MCV: 87.2 fL (ref 80.0–100.0)
Platelets: 528 K/uL — ABNORMAL HIGH (ref 150–400)
RBC: 2.98 MIL/uL — ABNORMAL LOW (ref 4.22–5.81)
RDW: 16.2 % — ABNORMAL HIGH (ref 11.5–15.5)
WBC: 9.4 K/uL (ref 4.0–10.5)
nRBC: 0 % (ref 0.0–0.2)

## 2024-10-20 LAB — GLUCOSE, CAPILLARY
Glucose-Capillary: 119 mg/dL — ABNORMAL HIGH (ref 70–99)
Glucose-Capillary: 127 mg/dL — ABNORMAL HIGH (ref 70–99)
Glucose-Capillary: 128 mg/dL — ABNORMAL HIGH (ref 70–99)
Glucose-Capillary: 139 mg/dL — ABNORMAL HIGH (ref 70–99)
Glucose-Capillary: 146 mg/dL — ABNORMAL HIGH (ref 70–99)
Glucose-Capillary: 154 mg/dL — ABNORMAL HIGH (ref 70–99)

## 2024-10-20 MED ORDER — OSMOLITE 1.2 CAL PO LIQD
1000.0000 mL | ORAL | Status: DC
  Administered 2024-10-20 – 2024-10-24 (×7): 1000 mL
  Filled 2024-10-20 (×11): qty 1000

## 2024-10-20 MED ORDER — FREE WATER
150.0000 mL | Status: DC
  Administered 2024-10-20 – 2024-10-25 (×30): 150 mL

## 2024-10-20 MED ADMIN — ORAL CARE MOUTH RINSE: 15 mL | OROMUCOSAL | NDC 99999080097

## 2024-10-20 MED ADMIN — Hydralazine HCl Tab 50 MG: 50 mg | NDC 23155083401

## 2024-10-20 MED ADMIN — Sennosides Tab 8.6 MG: 8.6 mg | NDC 00904725280

## 2024-10-20 MED ADMIN — Insulin Aspart Inj Soln 100 Unit/ML: 3 [IU] | SUBCUTANEOUS | NDC 73070010011

## 2024-10-20 MED ADMIN — Acetaminophen Tab 500 MG: 1000 mg | NDC 50580045711

## 2024-10-20 MED ADMIN — Insulin Aspart Inj Soln 100 Unit/ML: 4 [IU] | SUBCUTANEOUS | NDC 73070010011

## 2024-10-20 MED ADMIN — Nutritional Supplement Liquid: 237 mL | ORAL | NDC 4390018629

## 2024-10-20 MED ADMIN — Polyethylene Glycol 3350 Oral Packet 17 GM: 17 g | NDC 00904693186

## 2024-10-20 MED ADMIN — Heparin Sodium (Porcine) Inj 5000 Unit/ML: 5000 [IU] | SUBCUTANEOUS | NDC 72572025501

## 2024-10-20 MED ADMIN — Water For Irrigation, Sterile Irrigation Soln: 200 mL | NDC 99999080061

## 2024-10-20 MED ADMIN — B-Complex w/ C & Folic Acid Tab 0.8 MG: 1 | NDC 60258016001

## 2024-10-20 MED ADMIN — Lidocaine Patch 5%: 2 | TRANSDERMAL | NDC 65162079104

## 2024-10-20 MED ADMIN — Amlodipine Besylate Tab 10 MG (Base Equivalent): 10 mg | NDC 00904637161

## 2024-10-20 MED ADMIN — Quetiapine Fumarate Tab 50 MG: 50 mg | ORAL | NDC 50268063111

## 2024-10-20 MED ADMIN — Quetiapine Fumarate Tab 50 MG: 50 mg | ORAL | NDC 67877024901

## 2024-10-20 MED ADMIN — Oxycodone HCl Tab 5 MG: 10 mg | NDC 10702001850

## 2024-10-20 MED ADMIN — Aspirin Chew Tab 81 MG: 81 mg | NDC 00904679480

## 2024-10-20 MED FILL — Protein Liquid (Enteral): 60.0000 mL | ENTERAL | Qty: 60 | Status: AC
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
## 2024-10-20 NOTE — Plan of Care (Signed)
   Problem: Clinical Measurements: Goal: Ability to maintain clinical measurements within normal limits will improve Outcome: Progressing Goal: Will remain free from infection Outcome: Progressing Goal: Respiratory complications will improve Outcome: Progressing
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
## 2024-10-20 NOTE — Progress Notes (Signed)
 Notified by PT of regression to Rancho III from [place] IV and not FC. According to previous documentation he has been able to verbalize his name and follows some commands. I presented to the bedside and the patient is somnolent/asleep. Arouses to loud voice. And unable to tell me his name. He is repeating my words occasionally, example look at my finger he repeats with a soft voice ans well as hey. He is not answering any questions, is occasionally mumbling in attempt to respond. He is not following commands to open his mouth, move his legs, or move either upper extremity/squeeze my hand. His RUE is flaccid. Pupils are equal and reactive but sluggish, R eye with deviation to the right.   Exam: chronically ill, arousable CV: RRR Pulm: normal effort ORA Abd: soft  MSK: LUE in sling, RUE flaccid, BLE previous amputation    I have discussed with Dr. Sebastian and ordered STAT CT head.    Almarie Pringle, PA-C [facility] Please see Amion for pager number during day hours 7:00am-4:30pm
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
## 2024-10-21 LAB — GLUCOSE, CAPILLARY
Glucose-Capillary: 113 mg/dL — ABNORMAL HIGH (ref 70–99)
Glucose-Capillary: 142 mg/dL — ABNORMAL HIGH (ref 70–99)
Glucose-Capillary: 148 mg/dL — ABNORMAL HIGH (ref 70–99)
Glucose-Capillary: 162 mg/dL — ABNORMAL HIGH (ref 70–99)
Glucose-Capillary: 163 mg/dL — ABNORMAL HIGH (ref 70–99)
Glucose-Capillary: 174 mg/dL — ABNORMAL HIGH (ref 70–99)
Glucose-Capillary: 99 mg/dL (ref 70–99)

## 2024-10-21 MED ADMIN — ORAL CARE MOUTH RINSE: 15 mL | OROMUCOSAL | NDC 99999080097

## 2024-10-21 MED ADMIN — Hydralazine HCl Tab 50 MG: 50 mg | NDC 23155083401

## 2024-10-21 MED ADMIN — Insulin Aspart Inj Soln 100 Unit/ML: 3 [IU] | SUBCUTANEOUS | NDC 73070010011

## 2024-10-21 MED ADMIN — Acetaminophen Tab 500 MG: 1000 mg | NDC 50580045711

## 2024-10-21 MED ADMIN — Insulin Aspart Inj Soln 100 Unit/ML: 4 [IU] | SUBCUTANEOUS | NDC 73070010011

## 2024-10-21 MED ADMIN — Nutritional Supplement Liquid: 237 mL | ORAL | NDC 7007464928

## 2024-10-21 MED ADMIN — Nutritional Supplement Liquid: 237 mL | ORAL | NDC 4390018629

## 2024-10-21 MED ADMIN — Heparin Sodium (Porcine) Inj 5000 Unit/ML: 5000 [IU] | SUBCUTANEOUS | NDC 72572025501

## 2024-10-21 MED ADMIN — B-Complex w/ C & Folic Acid Tab 0.8 MG: 1 | NDC 60258016001

## 2024-10-21 MED ADMIN — Lidocaine Patch 5%: 2 | TRANSDERMAL | NDC 65162079104

## 2024-10-21 MED ADMIN — Amlodipine Besylate Tab 10 MG (Base Equivalent): 10 mg | NDC 00904637161

## 2024-10-21 MED ADMIN — Quetiapine Fumarate Tab 50 MG: 50 mg | ORAL | NDC 67877024901

## 2024-10-21 MED ADMIN — Oxycodone HCl Tab 5 MG: 10 mg | NDC 10702001850

## 2024-10-21 MED ADMIN — Aspirin Chew Tab 81 MG: 81 mg | NDC 00904679480

## 2024-10-21 MED ADMIN — Protein Liquid (Enteral): 60 mL | NDC 9468801446

## 2024-10-21 NOTE — Progress Notes (Signed)
 SLP Cancellation Note  Patient Details Name: Scott Greer MRN: [id number] DOB: Mar 18, 1956   Cancelled treatment:       SLP attempted to visit patient for dysphagia and cognitive tx, however patient extremely lethargic with no attempts to open eyes despite max verbal and tactile cues. Continue full liquid diet, however do not offer PO unless patient is fully awake/alert. SLP will attempt to return as patient/therapist is able.    Courteny Egler M.A., CCC-SLP 10/21/2024, 1:39 PM
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
## 2024-10-22 LAB — GLUCOSE, CAPILLARY
Glucose-Capillary: 168 mg/dL — ABNORMAL HIGH (ref 70–99)
Glucose-Capillary: 179 mg/dL — ABNORMAL HIGH (ref 70–99)
Glucose-Capillary: 189 mg/dL — ABNORMAL HIGH (ref 70–99)
Glucose-Capillary: 105 mg/dL — ABNORMAL HIGH (ref 70–99)
Glucose-Capillary: 82 mg/dL (ref 70–99)
Glucose-Capillary: 96 mg/dL (ref 70–99)

## 2024-10-22 MED ORDER — QUETIAPINE 12.5 MG HALF TABLET
25.0000 mg | ORAL_TABLET | Freq: Every day | ORAL | Status: DC
  Administered 2024-10-22 – 2024-10-24 (×3): 25 mg via ORAL
  Filled 2024-10-22 (×3): qty 2

## 2024-10-22 MED ORDER — QUETIAPINE FUMARATE 50 MG PO TABS
50.0000 mg | ORAL_TABLET | Freq: Every day | ORAL | Status: DC
  Administered 2024-10-22 – 2024-10-23 (×2): 50 mg via ORAL
  Filled 2024-10-22 (×2): qty 1

## 2024-10-22 MED ADMIN — ORAL CARE MOUTH RINSE: 15 mL | OROMUCOSAL | NDC 99999080097

## 2024-10-22 MED ADMIN — Hydralazine HCl Tab 50 MG: 50 mg | NDC 23155083401

## 2024-10-22 MED ADMIN — Sennosides Tab 8.6 MG: 8.6 mg | NDC 00904725280

## 2024-10-22 MED ADMIN — Insulin Aspart Inj Soln 100 Unit/ML: 3 [IU] | SUBCUTANEOUS | NDC 73070010011

## 2024-10-22 MED ADMIN — Acetaminophen Tab 500 MG: 1000 mg | NDC 50580045711

## 2024-10-22 MED ADMIN — Insulin Aspart Inj Soln 100 Unit/ML: 4 [IU] | SUBCUTANEOUS | NDC 73070010011

## 2024-10-22 MED ADMIN — Nutritional Supplement Liquid: 237 mL | ORAL | NDC 4390018629

## 2024-10-22 MED ADMIN — Polyethylene Glycol 3350 Oral Packet 17 GM: 17 g | NDC 00904693186

## 2024-10-22 MED ADMIN — Heparin Sodium (Porcine) Inj 5000 Unit/ML: 5000 [IU] | SUBCUTANEOUS | NDC 72572025501

## 2024-10-22 MED ADMIN — B-Complex w/ C & Folic Acid Tab 0.8 MG: 1 | NDC 60258016001

## 2024-10-22 MED ADMIN — Lidocaine Patch 5%: 2 | TRANSDERMAL | NDC 65162079104

## 2024-10-22 MED ADMIN — Amlodipine Besylate Tab 10 MG (Base Equivalent): 10 mg | NDC 00904637161

## 2024-10-22 MED ADMIN — Darbepoetin Alfa Soln Prefilled Syringe 40 MCG/0.4ML: 40 ug | SUBCUTANEOUS | NDC 55513002101

## 2024-10-22 MED ADMIN — Aspirin Chew Tab 81 MG: 81 mg | NDC 00904679480

## 2024-10-22 MED ADMIN — Protein Liquid (Enteral): 60 mL | NDC 9468801446
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
## 2024-10-22 NOTE — Plan of Care (Signed)
  Problem: Clinical Measurements: Goal: Will remain free from infection Outcome: Progressing Goal: Diagnostic test results will improve Outcome: Progressing Goal: Respiratory complications will improve Outcome: Progressing Goal: Cardiovascular complication will be avoided Outcome: Progressing   Problem: Safety: Goal: Ability to remain free from injury will improve Outcome: Progressing   Problem: Skin Integrity: Goal: Risk for impaired skin integrity will decrease Outcome: Progressing   
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
## 2024-10-22 NOTE — Plan of Care (Addendum)
°  Problem: Clinical Measurements: Goal: Will remain free from infection Outcome: Progressing Goal: Diagnostic test results will improve Outcome: Progressing Goal: Respiratory complications will improve Outcome: Progressing Goal: Cardiovascular complication will be avoided Outcome: Progressing   Problem: Safety: Goal: Ability to remain free from injury will improve Outcome: Progressing   Problem: Skin Integrity: Goal: Risk for impaired skin integrity will decrease Outcome: Progressing   Problem: Clinical Measurements: Goal: Postoperative complications will be avoided or minimized Outcome: Progressing   Problem: Pain Management: Goal: Pain level will decrease Outcome: Progressing   Problem: Activity: Goal: Ability to ambulate and perform ADLs will improve Outcome: Not Progressing   Problem: Self-Concept: Goal: Ability to maintain and perform role responsibilities to the fullest extent possible will improve Outcome: Not Progressing

## 2024-10-23 LAB — BASIC METABOLIC PANEL WITH GFR
Anion gap: 10 (ref 5–15)
BUN: 59 mg/dL — ABNORMAL HIGH (ref 8–23)
CO2: 22 mmol/L (ref 22–32)
Calcium: 8.9 mg/dL (ref 8.9–10.3)
Chloride: 107 mmol/L (ref 98–111)
Creatinine, Ser: 1.72 mg/dL — ABNORMAL HIGH (ref 0.61–1.24)
GFR, Estimated: 43 mL/min — ABNORMAL LOW
Glucose, Bld: 87 mg/dL (ref 70–99)
Potassium: 4.8 mmol/L (ref 3.5–5.1)
Sodium: 140 mmol/L (ref 135–145)

## 2024-10-23 LAB — GLUCOSE, CAPILLARY
Glucose-Capillary: 116 mg/dL — ABNORMAL HIGH (ref 70–99)
Glucose-Capillary: 133 mg/dL — ABNORMAL HIGH (ref 70–99)
Glucose-Capillary: 141 mg/dL — ABNORMAL HIGH (ref 70–99)
Glucose-Capillary: 151 mg/dL — ABNORMAL HIGH (ref 70–99)
Glucose-Capillary: 175 mg/dL — ABNORMAL HIGH (ref 70–99)
Glucose-Capillary: 99 mg/dL (ref 70–99)

## 2024-10-23 MED ADMIN — ORAL CARE MOUTH RINSE: 15 mL | OROMUCOSAL | NDC 99999080097

## 2024-10-23 MED ADMIN — Hydralazine HCl Tab 50 MG: 50 mg | NDC 23155083401

## 2024-10-23 MED ADMIN — Sennosides Tab 8.6 MG: 8.6 mg | NDC 00904725280

## 2024-10-23 MED ADMIN — Insulin Aspart Inj Soln 100 Unit/ML: 3 [IU] | SUBCUTANEOUS | NDC 73070010011

## 2024-10-23 MED ADMIN — Acetaminophen Tab 500 MG: 1000 mg | NDC 50580045711

## 2024-10-23 MED ADMIN — Insulin Aspart Inj Soln 100 Unit/ML: 4 [IU] | SUBCUTANEOUS | NDC 73070010011

## 2024-10-23 MED ADMIN — Nutritional Supplement Liquid: 237 mL | ORAL | NDC 4390018629

## 2024-10-23 MED ADMIN — Heparin Sodium (Porcine) Inj 5000 Unit/ML: 5000 [IU] | SUBCUTANEOUS | NDC 72572025501

## 2024-10-23 MED ADMIN — B-Complex w/ C & Folic Acid Tab 0.8 MG: 1 | NDC 60258016001

## 2024-10-23 MED ADMIN — Lidocaine Patch 5%: 2 | TRANSDERMAL | NDC 65162079104

## 2024-10-23 MED ADMIN — Amlodipine Besylate Tab 10 MG (Base Equivalent): 10 mg | NDC 00904637161

## 2024-10-23 MED ADMIN — Aspirin Chew Tab 81 MG: 81 mg | NDC 00904679480

## 2024-10-23 MED ADMIN — Protein Liquid (Enteral): 60 mL | NDC 9468801446

## 2024-10-23 NOTE — Plan of Care (Signed)
" °  Problem: Safety: Goal: Ability to remain free from injury will improve Outcome: Progressing   Problem: Skin Integrity: Goal: Risk for impaired skin integrity will decrease Outcome: Progressing   Problem: Metabolic: Goal: Ability to maintain appropriate glucose levels will improve Outcome: Progressing   Problem: Nutritional: Goal: Maintenance of adequate nutrition will improve Outcome: Progressing   Problem: Skin Integrity: Goal: Risk for impaired skin integrity will decrease Outcome: Progressing   Problem: Tissue Perfusion: Goal: Adequacy of tissue perfusion will improve Outcome: Progressing   "
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
## 2024-10-24 ENCOUNTER — Inpatient Hospital Stay (HOSPITAL_COMMUNITY)

## 2024-10-24 LAB — BASIC METABOLIC PANEL WITH GFR
Anion gap: 9 (ref 5–15)
BUN: 56 mg/dL — ABNORMAL HIGH (ref 8–23)
CO2: 22 mmol/L (ref 22–32)
Calcium: 8.8 mg/dL — ABNORMAL LOW (ref 8.9–10.3)
Chloride: 107 mmol/L (ref 98–111)
Creatinine, Ser: 1.63 mg/dL — ABNORMAL HIGH (ref 0.61–1.24)
GFR, Estimated: 46 mL/min — ABNORMAL LOW
Glucose, Bld: 130 mg/dL — ABNORMAL HIGH (ref 70–99)
Potassium: 5.2 mmol/L — ABNORMAL HIGH (ref 3.5–5.1)
Sodium: 138 mmol/L (ref 135–145)

## 2024-10-24 LAB — GLUCOSE, CAPILLARY
Glucose-Capillary: 100 mg/dL — ABNORMAL HIGH (ref 70–99)
Glucose-Capillary: 121 mg/dL — ABNORMAL HIGH (ref 70–99)
Glucose-Capillary: 128 mg/dL — ABNORMAL HIGH (ref 70–99)
Glucose-Capillary: 129 mg/dL — ABNORMAL HIGH (ref 70–99)
Glucose-Capillary: 139 mg/dL — ABNORMAL HIGH (ref 70–99)
Glucose-Capillary: 143 mg/dL — ABNORMAL HIGH (ref 70–99)
Glucose-Capillary: 147 mg/dL — ABNORMAL HIGH (ref 70–99)

## 2024-10-24 MED ADMIN — ORAL CARE MOUTH RINSE: 15 mL | OROMUCOSAL | NDC 99999080097

## 2024-10-24 MED ADMIN — Hydralazine HCl Tab 50 MG: 50 mg | NDC 23155083401

## 2024-10-24 MED ADMIN — Sennosides Tab 8.6 MG: 8.6 mg | NDC 00904725280

## 2024-10-24 MED ADMIN — Insulin Aspart Inj Soln 100 Unit/ML: 3 [IU] | SUBCUTANEOUS | NDC 73070010011

## 2024-10-24 MED ADMIN — Acetaminophen Tab 500 MG: 1000 mg | NDC 50580045711

## 2024-10-24 MED ADMIN — Nutritional Supplement Liquid: 237 mL | ORAL | NDC 4390018629

## 2024-10-24 MED ADMIN — Polyethylene Glycol 3350 Oral Packet 17 GM: 17 g | NDC 00904693186

## 2024-10-24 MED ADMIN — Heparin Sodium (Porcine) Inj 5000 Unit/ML: 5000 [IU] | SUBCUTANEOUS | NDC 72572025501

## 2024-10-24 MED ADMIN — B-Complex w/ C & Folic Acid Tab 0.8 MG: 1 | NDC 60258016001

## 2024-10-24 MED ADMIN — Lidocaine Patch 5%: 2 | TRANSDERMAL | NDC 65162079104

## 2024-10-24 MED ADMIN — Amlodipine Besylate Tab 10 MG (Base Equivalent): 10 mg | NDC 00904637161

## 2024-10-24 MED ADMIN — Oxycodone HCl Tab 5 MG: 10 mg | NDC 00406055223

## 2024-10-24 MED ADMIN — Aspirin Chew Tab 81 MG: 81 mg | NDC 00904679480

## 2024-10-24 MED ADMIN — Protein Liquid (Enteral): 60 mL | NDC 9468801446

## 2024-10-24 MED ADMIN — Quetiapine Fumarate Tab 25 MG: 25 mg | ORAL | NDC 99999010012

## 2024-10-24 MED FILL — Quetiapine Fumarate Tab 25 MG: 25.0000 mg | ORAL | Qty: 2 | Status: AC
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
## 2024-10-24 NOTE — Plan of Care (Signed)
   Problem: Fluid Volume: Goal: Ability to maintain a balanced intake and output will improve Outcome: Progressing   Problem: Nutritional: Goal: Maintenance of adequate nutrition will improve Outcome: Progressing

## 2024-10-24 NOTE — Progress Notes (Addendum)
" ° °  ORTHOPAEDIC PROGRESS NOTE  s/p Procedure(s): LEFT FEMUR INTRAMEDULLARY ROD, RETROGRADE on 11/28 with Dr. Beverley Non op left proximal humerus fracture  SUBJECTIVE: Patient has been transferred to 6N. Not very conversant today. Grunts when asking him about his pain.   OBJECTIVE: PE: Bilateral BKAs LLE: incisions CDI. Nylon sutures in place. Removed proximal nylon sutures. Started removing lateral incision sutures, but incision started opening slightly. Held off on removing the rest of the incision. Steri-strips place. New dressing placed. Distal sutures left in place as I did not want incision to open. Reassess later this week. New mepilex placed.   Vitals:   10/24/24 1043 10/24/24 1335  BP: 118/62 118/82  Pulse: 79   Resp: 16   Temp: 98.1 F (36.7 C)   SpO2: 100%     Opiates Today (MME): Today's  total administered Morphine  Milligram Equivalents: 15 Opiates Yesterday (MME): Yesterday's total administered Morphine  Milligram Equivalents: 0 Opiates Used in the last two days:  Inpatient Morphine  Milligram Equivalents Per Day 12/13 - 12/20   Values displayed are in units of MME/Day    Order Start / End Date 12/13 12/14 12/15  12/16 12/17 12/18  Yesterday Today    oxyCODONE  (Oxy IR/ROXICODONE ) immediate release tablet 5-10 mg 12/5 - No end date 45 of 45-90 15 of 45-90 22.5 of 45-90 15 of 45-90 15 of 45-90 0 of 45-90 0 of 45-90 15 of 45-90    Daily Totals  45 of 45-90 15 of 45-90 22.5 of 45-90 15 of 45-90 15 of 45-90 0 of 45-90 0 of 45-90 15 of 45-90       ASSESSMENT: Scott Greer is a 68 y.o. male POD#22  PLAN: Weightbearing: Mobilize as tolerated. Wheelchair bound.  Insicional and dressing care: Reinforce dressings as needed. Nylon sutures still in place for larger incisions. Incision started to open slightly with attempt at removal, so did not want to continue to remove. New mepilex placed over incisions. Will recheck later to see if okay for removal.  Orthopedic  device(s): None Showering: keep incisions clean and dry.  VTE prophylaxis: per primary team Pain control: PRN Follow - up plan: TBD   Left humerus fracture - non-operative management. NWB LUE in sling.  Bilateral pubic rami fracture - nonoperative management.   Contact information:  After hours and holidays please check [email or web address] for group call information for [facility]  Aleck Stalling, PA-C 10/24/24   "

## 2024-10-25 LAB — BASIC METABOLIC PANEL WITH GFR
Anion gap: 13 (ref 5–15)
BUN: 57 mg/dL — ABNORMAL HIGH (ref 8–23)
CO2: 18 mmol/L — ABNORMAL LOW (ref 22–32)
Calcium: 8.8 mg/dL — ABNORMAL LOW (ref 8.9–10.3)
Chloride: 106 mmol/L (ref 98–111)
Creatinine, Ser: 1.66 mg/dL — ABNORMAL HIGH (ref 0.61–1.24)
GFR, Estimated: 45 mL/min — ABNORMAL LOW
Glucose, Bld: 118 mg/dL — ABNORMAL HIGH (ref 70–99)
Potassium: 5.1 mmol/L (ref 3.5–5.1)
Sodium: 137 mmol/L (ref 135–145)

## 2024-10-25 LAB — GLUCOSE, CAPILLARY
Glucose-Capillary: 107 mg/dL — ABNORMAL HIGH (ref 70–99)
Glucose-Capillary: 116 mg/dL — ABNORMAL HIGH (ref 70–99)
Glucose-Capillary: 128 mg/dL — ABNORMAL HIGH (ref 70–99)
Glucose-Capillary: 138 mg/dL — ABNORMAL HIGH (ref 70–99)
Glucose-Capillary: 147 mg/dL — ABNORMAL HIGH (ref 70–99)
Glucose-Capillary: 158 mg/dL — ABNORMAL HIGH (ref 70–99)

## 2024-10-25 MED ADMIN — ORAL CARE MOUTH RINSE: 15 mL | OROMUCOSAL | NDC 99999080097

## 2024-10-25 MED ADMIN — Hydralazine HCl Tab 50 MG: 50 mg | NDC 23155083401

## 2024-10-25 MED ADMIN — Sennosides Tab 8.6 MG: 8.6 mg | NDC 00904725280

## 2024-10-25 MED ADMIN — Insulin Aspart Inj Soln 100 Unit/ML: 3 [IU] | SUBCUTANEOUS | NDC 73070010011

## 2024-10-25 MED ADMIN — Acetaminophen Tab 500 MG: 1000 mg | NDC 50580045711

## 2024-10-25 MED ADMIN — Insulin Aspart Inj Soln 100 Unit/ML: 4 [IU] | SUBCUTANEOUS | NDC 73070010011

## 2024-10-25 MED ADMIN — Nutritional Supplement Liquid: 237 mL | ORAL | NDC 4390018629

## 2024-10-25 MED ADMIN — Polyethylene Glycol 3350 Oral Packet 17 GM: 17 g | NDC 00904693186

## 2024-10-25 MED ADMIN — Heparin Sodium (Porcine) Inj 5000 Unit/ML: 5000 [IU] | SUBCUTANEOUS | NDC 72572025501

## 2024-10-25 MED ADMIN — B-Complex w/ C & Folic Acid Tab 0.8 MG: 1 | NDC 60258016001

## 2024-10-25 MED ADMIN — Oxycodone HCl Tab 5 MG: 5 mg | NDC 00406055223

## 2024-10-25 MED ADMIN — Lidocaine Patch 5%: 2 | TRANSDERMAL | NDC 82347050504

## 2024-10-25 MED ADMIN — Amlodipine Besylate Tab 10 MG (Base Equivalent): 10 mg | NDC 00904637161

## 2024-10-25 MED ADMIN — Aspirin Chew Tab 81 MG: 81 mg | NDC 00904679480

## 2024-10-25 MED ADMIN — Protein Liquid (Enteral): 60 mL | NDC 9468801446

## 2024-10-25 MED ADMIN — Water For Irrigation, Sterile Irrigation Soln: 100 mL | NDC 99999080061

## 2024-10-25 MED ADMIN — Quetiapine Fumarate Tab 25 MG: 25 mg | ORAL | NDC 99999010012

## 2024-10-25 MED FILL — Oxycodone HCl Tab 5 MG: 5.0000 mg | ORAL | Qty: 1 | Status: AC
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
## 2024-10-25 NOTE — Plan of Care (Signed)
   Problem: Fluid Volume: Goal: Ability to maintain a balanced intake and output will improve Outcome: Progressing   Problem: Nutritional: Goal: Maintenance of adequate nutrition will improve Outcome: Progressing

## 2024-10-25 NOTE — Plan of Care (Signed)
  Problem: Clinical Measurements: Goal: Will remain free from infection Outcome: Progressing Goal: Respiratory complications will improve Outcome: Progressing Goal: Cardiovascular complication will be avoided Outcome: Progressing   Problem: Skin Integrity: Goal: Risk for impaired skin integrity will decrease Outcome: Progressing

## 2024-10-25 NOTE — Plan of Care (Signed)
" °  Problem: Clinical Measurements: Goal: Will remain free from infection Outcome: Progressing Goal: Diagnostic test results will improve Outcome: Progressing Goal: Respiratory complications will improve 10/25/2024 2027 by Veatrice Eckstein K, RN Outcome: Progressing 10/25/2024 2027 by Florian Dellis POUR, RN Outcome: Progressing Goal: Cardiovascular complication will be avoided 10/25/2024 2027 by Florian Dellis POUR, RN Outcome: Progressing 10/25/2024 2027 by Florian Dellis POUR, RN Outcome: Progressing   Problem: Skin Integrity: Goal: Risk for impaired skin integrity will decrease Outcome: Progressing   "

## 2024-10-26 LAB — BASIC METABOLIC PANEL WITH GFR
Anion gap: 11 (ref 5–15)
BUN: 57 mg/dL — ABNORMAL HIGH (ref 8–23)
CO2: 21 mmol/L — ABNORMAL LOW (ref 22–32)
Calcium: 9.1 mg/dL (ref 8.9–10.3)
Chloride: 104 mmol/L (ref 98–111)
Creatinine, Ser: 1.62 mg/dL — ABNORMAL HIGH (ref 0.61–1.24)
GFR, Estimated: 47 mL/min — ABNORMAL LOW
Glucose, Bld: 176 mg/dL — ABNORMAL HIGH (ref 70–99)
Potassium: 4.9 mmol/L (ref 3.5–5.1)
Sodium: 136 mmol/L (ref 135–145)

## 2024-10-26 LAB — GLUCOSE, CAPILLARY
Glucose-Capillary: 135 mg/dL — ABNORMAL HIGH (ref 70–99)
Glucose-Capillary: 150 mg/dL — ABNORMAL HIGH (ref 70–99)
Glucose-Capillary: 186 mg/dL — ABNORMAL HIGH (ref 70–99)

## 2024-10-26 MED ADMIN — ORAL CARE MOUTH RINSE: 15 mL | OROMUCOSAL | NDC 99999080097

## 2024-10-26 MED ADMIN — Water For Irrigation, Sterile Irrigation Soln: 50 mL | NDC 99999080061

## 2024-10-26 MED ADMIN — Hydralazine HCl Tab 50 MG: 50 mg | NDC 23155083401

## 2024-10-26 MED ADMIN — Sennosides Tab 8.6 MG: 8.6 mg | NDC 00904725280

## 2024-10-26 MED ADMIN — Insulin Aspart Inj Soln 100 Unit/ML: 3 [IU] | SUBCUTANEOUS | NDC 73070010011

## 2024-10-26 MED ADMIN — Acetaminophen Tab 500 MG: 1000 mg | NDC 50580045711

## 2024-10-26 MED ADMIN — Insulin Aspart Inj Soln 100 Unit/ML: 4 [IU] | SUBCUTANEOUS | NDC 73070010011

## 2024-10-26 MED ADMIN — Nutritional Supplement Liquid: 237 mL | ORAL | NDC 4390018629

## 2024-10-26 MED ADMIN — Polyethylene Glycol 3350 Oral Packet 17 GM: 17 g | NDC 00904693186

## 2024-10-26 MED ADMIN — Protein Liquid (Enteral): 60 mL | NDC 9468801446

## 2024-10-26 MED ADMIN — Heparin Sodium (Porcine) Inj 5000 Unit/ML: 5000 [IU] | SUBCUTANEOUS | NDC 72572025501

## 2024-10-26 MED ADMIN — Nutritional Supplement Liquid: 960 mL | NDC 7007457472

## 2024-10-26 MED ADMIN — B-Complex w/ C & Folic Acid Tab 0.8 MG: 1 | NDC 60258016001

## 2024-10-26 MED ADMIN — Oxycodone HCl Tab 5 MG: 5 mg | NDC 00406055223

## 2024-10-26 MED ADMIN — Lidocaine Patch 5%: 2 | TRANSDERMAL | NDC 65162079104

## 2024-10-26 MED ADMIN — Amlodipine Besylate Tab 10 MG (Base Equivalent): 10 mg | NDC 00904637161

## 2024-10-26 MED ADMIN — Insulin Glargine Inj 100 Unit/ML: 5 [IU] | SUBCUTANEOUS | NDC 00088222033

## 2024-10-26 MED ADMIN — Aspirin Chew Tab 81 MG: 81 mg | NDC 00904679480

## 2024-10-26 MED ADMIN — Quetiapine Fumarate Tab 25 MG: 12.5 mg | ORAL | NDC 99999010012

## 2024-10-26 MED ADMIN — Water For Irrigation, Sterile Irrigation Soln: 100 mL | NDC 99999080061

## 2024-10-26 MED FILL — Protein Liquid (Enteral): 60.0000 mL | ENTERAL | Qty: 60 | Status: AC

## 2024-10-26 MED FILL — Insulin Glargine Inj 100 Unit/ML: 5.0000 [IU] | SUBCUTANEOUS | Qty: 0.05 | Status: AC

## 2024-10-26 MED FILL — Quetiapine Fumarate Tab 25 MG: 12.5000 mg | ORAL | Qty: 1 | Status: AC

## 2024-10-26 MED FILL — Nutritional Supplement Liquid: 960.0000 mL | ORAL | Qty: 1000 | Status: AC
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
## 2024-10-26 NOTE — TOC Progression Note (Signed)
 Transition of Care [facility]) - Progression Note    Patient Details  Name: Scott Greer MRN: [id number] Date of Birth: 1956-03-21  Transition of [facility]) CM/SW Contact  Kelechi Orgeron E Matheo Rathbone, LCSW Phone Number: 10/26/2024, 12:47 PM  Clinical Narrative:    [name] with [facility] DSS states she has obtained the Protective Order, spoke with [name] and her Supervisor who are unsure when the official Protective Order paperwork will be back due to the holiday week - but they are willing to sign patient in to [facility] today. Explained this to Tammy in Admissions and they are able to take patient today at [facility] with the above information.    Barriers to Discharge: Continued Medical Work up               Expected Discharge Plan and Services       Living arrangements for the past 2 months: Homeless                                       Social Drivers of Health (SDOH) Interventions SDOH Screenings   Food Insecurity: Food Insecurity Present (10/01/2024)  Housing: High Risk (10/01/2024)  Tobacco Use: Low Risk (10/16/2024)    Readmission Risk Interventions     No data to display
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
## 2024-10-27 LAB — GLUCOSE, CAPILLARY
Glucose-Capillary: 121 mg/dL — ABNORMAL HIGH (ref 70–99)
Glucose-Capillary: 128 mg/dL — ABNORMAL HIGH (ref 70–99)
Glucose-Capillary: 129 mg/dL — ABNORMAL HIGH (ref 70–99)
Glucose-Capillary: 142 mg/dL — ABNORMAL HIGH (ref 70–99)
Glucose-Capillary: 144 mg/dL — ABNORMAL HIGH (ref 70–99)
Glucose-Capillary: 151 mg/dL — ABNORMAL HIGH (ref 70–99)
Glucose-Capillary: 161 mg/dL — ABNORMAL HIGH (ref 70–99)
Glucose-Capillary: 94 mg/dL (ref 70–99)

## 2024-10-27 MED ADMIN — ORAL CARE MOUTH RINSE: 15 mL | OROMUCOSAL | NDC 99999080097

## 2024-10-27 MED ADMIN — Water For Irrigation, Sterile Irrigation Soln: 50 mL | NDC 99999080061

## 2024-10-27 MED ADMIN — Hydralazine HCl Tab 50 MG: 50 mg | NDC 23155083401

## 2024-10-27 MED ADMIN — Sennosides Tab 8.6 MG: 8.6 mg | NDC 00904725280

## 2024-10-27 MED ADMIN — Acetaminophen Tab 500 MG: 1000 mg | NDC 50580045711

## 2024-10-27 MED ADMIN — Insulin Aspart Inj Soln 100 Unit/ML: 3 [IU] | SUBCUTANEOUS | NDC 73070010011

## 2024-10-27 MED ADMIN — Insulin Aspart Inj Soln 100 Unit/ML: 4 [IU] | SUBCUTANEOUS | NDC 73070010011

## 2024-10-27 MED ADMIN — Nutritional Supplement Liquid: 237 mL | ORAL | NDC 4390018629

## 2024-10-27 MED ADMIN — Polyethylene Glycol 3350 Oral Packet 17 GM: 17 g | NDC 00904693186

## 2024-10-27 MED ADMIN — Protein Liquid (Enteral): 60 mL | NDC 9468801446

## 2024-10-27 MED ADMIN — Heparin Sodium (Porcine) Inj 5000 Unit/ML: 5000 [IU] | SUBCUTANEOUS | NDC 72572025501

## 2024-10-27 MED ADMIN — Nutritional Supplement Liquid: 960 mL | NDC 7007462698

## 2024-10-27 MED ADMIN — B-Complex w/ C & Folic Acid Tab 0.8 MG: 1 | NDC 60258016001

## 2024-10-27 MED ADMIN — Lidocaine Patch 5%: 2 | TRANSDERMAL | NDC 65162079104

## 2024-10-27 MED ADMIN — Amlodipine Besylate Tab 10 MG (Base Equivalent): 10 mg | NDC 00904637161

## 2024-10-27 MED ADMIN — Insulin Glargine Inj 100 Unit/ML: 5 [IU] | SUBCUTANEOUS | NDC 00088222033

## 2024-10-27 MED ADMIN — Aspirin Chew Tab 81 MG: 81 mg | NDC 00904679480

## 2024-10-27 MED ADMIN — Quetiapine Fumarate Tab 25 MG: 12.5 mg | ORAL | NDC 99999010012
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
## 2024-10-27 NOTE — Plan of Care (Signed)
  Problem: Clinical Measurements: Goal: Ability to maintain clinical measurements within normal limits will improve Outcome: Progressing Goal: Will remain free from infection Outcome: Progressing Goal: Diagnostic test results will improve Outcome: Progressing Goal: Respiratory complications will improve Outcome: Progressing Goal: Cardiovascular complication will be avoided Outcome: Progressing   Problem: Safety: Goal: Ability to remain free from injury will improve Outcome: Progressing   Problem: Skin Integrity: Goal: Risk for impaired skin integrity will decrease Outcome: Progressing   Problem: Education: Goal: Ability to describe self-care measures that may prevent or decrease complications (Diabetes Survival Skills Education) will improve Outcome: Progressing Goal: Individualized Educational Video(s) Outcome: Progressing   Problem: Coping: Goal: Ability to adjust to condition or change in health will improve Outcome: Progressing   Problem: Fluid Volume: Goal: Ability to maintain a balanced intake and output will improve Outcome: Progressing   Problem: Health Behavior/Discharge Planning: Goal: Ability to identify and utilize available resources and services will improve Outcome: Progressing Goal: Ability to manage health-related needs will improve Outcome: Progressing   Problem: Metabolic: Goal: Ability to maintain appropriate glucose levels will improve Outcome: Progressing   Problem: Nutritional: Goal: Maintenance of adequate nutrition will improve Outcome: Progressing Goal: Progress toward achieving an optimal weight will improve Outcome: Progressing   Problem: Skin Integrity: Goal: Risk for impaired skin integrity will decrease Outcome: Progressing   Problem: Tissue Perfusion: Goal: Adequacy of tissue perfusion will improve Outcome: Progressing   Problem: Education: Goal: Verbalization of understanding the information provided (i.e., activity  precautions, restrictions, etc) will improve Outcome: Progressing Goal: Individualized Educational Video(s) Outcome: Progressing   Problem: Activity: Goal: Ability to ambulate and perform ADLs will improve Outcome: Progressing   Problem: Clinical Measurements: Goal: Postoperative complications will be avoided or minimized Outcome: Progressing   Problem: Self-Concept: Goal: Ability to maintain and perform role responsibilities to the fullest extent possible will improve Outcome: Progressing   Problem: Pain Management: Goal: Pain level will decrease Outcome: Progressing   Problem: Education: Goal: Verbalization of understanding the information provided (i.e., activity precautions, restrictions, etc) will improve Outcome: Progressing Goal: Individualized Educational Video(s) Outcome: Progressing   Problem: Activity: Goal: Ability to ambulate and perform ADLs will improve Outcome: Progressing   Problem: Clinical Measurements: Goal: Postoperative complications will be avoided or minimized Outcome: Progressing   Problem: Self-Concept: Goal: Ability to maintain and perform role responsibilities to the fullest extent possible will improve Outcome: Progressing   Problem: Pain Management: Goal: Pain level will decrease Outcome: Progressing   Problem: Education: Goal: Ability to describe self-care measures that may prevent or decrease complications (Diabetes Survival Skills Education) will improve Outcome: Progressing Goal: Individualized Educational Video(s) Outcome: Progressing   Problem: Coping: Goal: Ability to adjust to condition or change in health will improve Outcome: Progressing   Problem: Fluid Volume: Goal: Ability to maintain a balanced intake and output will improve Outcome: Progressing   Problem: Health Behavior/Discharge Planning: Goal: Ability to identify and utilize available resources and services will improve Outcome: Progressing Goal: Ability to  manage health-related needs will improve Outcome: Progressing   Problem: Metabolic: Goal: Ability to maintain appropriate glucose levels will improve Outcome: Progressing   Problem: Nutritional: Goal: Maintenance of adequate nutrition will improve Outcome: Progressing Goal: Progress toward achieving an optimal weight will improve Outcome: Progressing   Problem: Skin Integrity: Goal: Risk for impaired skin integrity will decrease Outcome: Progressing   Problem: Tissue Perfusion: Goal: Adequacy of tissue perfusion will improve Outcome: Progressing
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
## 2024-10-28 LAB — CBC
HCT: 24.5 % — ABNORMAL LOW (ref 39.0–52.0)
Hemoglobin: 7.5 g/dL — ABNORMAL LOW (ref 13.0–17.0)
MCH: 27.4 pg (ref 26.0–34.0)
MCHC: 30.6 g/dL (ref 30.0–36.0)
MCV: 89.4 fL (ref 80.0–100.0)
Platelets: 406 K/uL — ABNORMAL HIGH (ref 150–400)
RBC: 2.74 MIL/uL — ABNORMAL LOW (ref 4.22–5.81)
RDW: 17.4 % — ABNORMAL HIGH (ref 11.5–15.5)
WBC: 9.5 K/uL (ref 4.0–10.5)
nRBC: 0 % (ref 0.0–0.2)

## 2024-10-28 LAB — BASIC METABOLIC PANEL WITH GFR
Anion gap: 10 (ref 5–15)
BUN: 61 mg/dL — ABNORMAL HIGH (ref 8–23)
CO2: 22 mmol/L (ref 22–32)
Calcium: 9.1 mg/dL (ref 8.9–10.3)
Chloride: 107 mmol/L (ref 98–111)
Creatinine, Ser: 1.6 mg/dL — ABNORMAL HIGH (ref 0.61–1.24)
GFR, Estimated: 47 mL/min — ABNORMAL LOW
Glucose, Bld: 149 mg/dL — ABNORMAL HIGH (ref 70–99)
Potassium: 5 mmol/L (ref 3.5–5.1)
Sodium: 139 mmol/L (ref 135–145)

## 2024-10-28 LAB — GLUCOSE, CAPILLARY
Glucose-Capillary: 115 mg/dL — ABNORMAL HIGH (ref 70–99)
Glucose-Capillary: 124 mg/dL — ABNORMAL HIGH (ref 70–99)
Glucose-Capillary: 126 mg/dL — ABNORMAL HIGH (ref 70–99)
Glucose-Capillary: 132 mg/dL — ABNORMAL HIGH (ref 70–99)
Glucose-Capillary: 141 mg/dL — ABNORMAL HIGH (ref 70–99)
Glucose-Capillary: 151 mg/dL — ABNORMAL HIGH (ref 70–99)

## 2024-10-28 MED ADMIN — ORAL CARE MOUTH RINSE: 15 mL | OROMUCOSAL | NDC 99999080097

## 2024-10-28 MED ADMIN — Sennosides Tab 8.6 MG: 8.6 mg | ORAL | NDC 00904725280

## 2024-10-28 MED ADMIN — B-Complex w/ C & Folic Acid Tab 0.8 MG: 1 | ORAL | NDC 60258016001

## 2024-10-28 MED ADMIN — Water For Irrigation, Sterile Irrigation Soln: 50 mL | NDC 99999080061

## 2024-10-28 MED ADMIN — Hydralazine HCl Tab 50 MG: 50 mg | NDC 23155083401

## 2024-10-28 MED ADMIN — Acetaminophen Tab 500 MG: 1000 mg | NDC 50580045711

## 2024-10-28 MED ADMIN — Insulin Aspart Inj Soln 100 Unit/ML: 4 [IU] | SUBCUTANEOUS | NDC 73070010011

## 2024-10-28 MED ADMIN — Insulin Aspart Inj Soln 100 Unit/ML: 3 [IU] | SUBCUTANEOUS | NDC 73070010011

## 2024-10-28 MED ADMIN — Oxycodone HCl Tab 5 MG: 5 mg | ORAL | NDC 00406055223

## 2024-10-28 MED ADMIN — Nutritional Supplement Liquid: 237 mL | ORAL | NDC 4390018629

## 2024-10-28 MED ADMIN — Protein Liquid (Enteral): 60 mL | NDC 9468801446

## 2024-10-28 MED ADMIN — Hydralazine HCl Tab 50 MG: 50 mg | ORAL | NDC 23155083401

## 2024-10-28 MED ADMIN — Heparin Sodium (Porcine) Inj 5000 Unit/ML: 5000 [IU] | SUBCUTANEOUS | NDC 72572025501

## 2024-10-28 MED ADMIN — Nutritional Supplement Liquid: 960 mL | NDC 7007457472

## 2024-10-28 MED ADMIN — Oxycodone HCl Tab 5 MG: 5 mg | NDC 00406055223

## 2024-10-28 MED ADMIN — Lidocaine Patch 5%: 2 | TRANSDERMAL | NDC 65162079104

## 2024-10-28 MED ADMIN — Amlodipine Besylate Tab 10 MG (Base Equivalent): 10 mg | NDC 00904637161

## 2024-10-28 MED ADMIN — Insulin Glargine Inj 100 Unit/ML: 5 [IU] | SUBCUTANEOUS | NDC 00088222033

## 2024-10-28 MED ADMIN — Aspirin Chew Tab 81 MG: 81 mg | NDC 00904679480

## 2024-10-28 MED FILL — Oxycodone HCl Tab 5 MG: 5.0000 mg | ORAL | Qty: 1 | Status: AC

## 2024-10-28 MED FILL — Oxycodone HCl Tab 5 MG: 5.0000 mg | ORAL | Qty: 1 | Status: CN

## 2024-10-28 MED FILL — Amlodipine Besylate Tab 10 MG (Base Equivalent): 10.0000 mg | ORAL | Qty: 1 | Status: AC

## 2024-10-28 MED FILL — B-Complex w/ C & Folic Acid Tab 0.8 MG: 1.0000 | ORAL | Qty: 1 | Status: AC

## 2024-10-28 MED FILL — Aspirin Suppos 300 MG: 300.0000 mg | RECTAL | Qty: 1 | Status: AC

## 2024-10-28 MED FILL — Sennosides Tab 8.6 MG: 1.0000 | ORAL | Qty: 1 | Status: AC

## 2024-10-28 MED FILL — Acetaminophen Tab 500 MG: 1000.0000 mg | ORAL | Qty: 2 | Status: CN

## 2024-10-28 MED FILL — Acetaminophen Tab 500 MG: 1000.0000 mg | ORAL | Qty: 2 | Status: AC

## 2024-10-28 MED FILL — Hydralazine HCl Tab 50 MG: 50.0000 mg | ORAL | Qty: 1 | Status: AC

## 2024-10-28 MED FILL — Hydralazine HCl Tab 50 MG: 50.0000 mg | ORAL | Qty: 1 | Status: CN

## 2024-10-28 MED FILL — Aspirin Chew Tab 81 MG: 81.0000 mg | ORAL | Qty: 1 | Status: AC

## 2024-10-28 MED FILL — Polyethylene Glycol 3350 Oral Packet 17 GM: 17.0000 g | ORAL | Qty: 1 | Status: CN

## 2024-10-28 MED FILL — Polyethylene Glycol 3350 Oral Packet 17 GM: 17.0000 g | ORAL | Qty: 1 | Status: AC

## 2024-10-28 NOTE — Plan of Care (Signed)
   Problem: Fluid Volume: Goal: Ability to maintain a balanced intake and output will improve Outcome: Progressing   Problem: Nutritional: Goal: Maintenance of adequate nutrition will improve Outcome: Progressing
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
## 2024-10-29 LAB — GLUCOSE, CAPILLARY
Glucose-Capillary: 122 mg/dL — ABNORMAL HIGH (ref 70–99)
Glucose-Capillary: 141 mg/dL — ABNORMAL HIGH (ref 70–99)
Glucose-Capillary: 148 mg/dL — ABNORMAL HIGH (ref 70–99)
Glucose-Capillary: 162 mg/dL — ABNORMAL HIGH (ref 70–99)
Glucose-Capillary: 168 mg/dL — ABNORMAL HIGH (ref 70–99)
Glucose-Capillary: 174 mg/dL — ABNORMAL HIGH (ref 70–99)

## 2024-10-29 MED ADMIN — ORAL CARE MOUTH RINSE: 15 mL | OROMUCOSAL | NDC 99999080097

## 2024-10-29 MED ADMIN — Sennosides Tab 8.6 MG: 8.6 mg | ORAL | NDC 00904725280

## 2024-10-29 MED ADMIN — B-Complex w/ C & Folic Acid Tab 0.8 MG: 1 | ORAL | NDC 60258016001

## 2024-10-29 MED ADMIN — Water For Irrigation, Sterile Irrigation Soln: 50 mL | NDC 99999080061

## 2024-10-29 MED ADMIN — Amlodipine Besylate Tab 10 MG (Base Equivalent): 10 mg | ORAL | NDC 00904637161

## 2024-10-29 MED ADMIN — Insulin Aspart Inj Soln 100 Unit/ML: 3 [IU] | SUBCUTANEOUS | NDC 73070010011

## 2024-10-29 MED ADMIN — Insulin Aspart Inj Soln 100 Unit/ML: 4 [IU] | SUBCUTANEOUS | NDC 73070010011

## 2024-10-29 MED ADMIN — Oxycodone HCl Tab 5 MG: 5 mg | ORAL | NDC 00406055223

## 2024-10-29 MED ADMIN — Nutritional Supplement Liquid: 237 mL | ORAL | NDC 7007468235

## 2024-10-29 MED ADMIN — Nutritional Supplement Liquid: 237 mL | ORAL | NDC 4390018629

## 2024-10-29 MED ADMIN — Acetaminophen Tab 500 MG: 1000 mg | ORAL | NDC 50580045711

## 2024-10-29 MED ADMIN — Protein Liquid (Enteral): 60 mL | NDC 9468801446

## 2024-10-29 MED ADMIN — Hydralazine HCl Tab 50 MG: 50 mg | ORAL | NDC 23155083401

## 2024-10-29 MED ADMIN — Heparin Sodium (Porcine) Inj 5000 Unit/ML: 5000 [IU] | SUBCUTANEOUS | NDC 72572025501

## 2024-10-29 MED ADMIN — Nutritional Supplement Liquid: 960 mL | NDC 7007462700

## 2024-10-29 MED ADMIN — Aspirin Chew Tab 81 MG: 81 mg | ORAL | NDC 00904679480

## 2024-10-29 MED ADMIN — Polyethylene Glycol 3350 Oral Packet 17 GM: 17 g | ORAL | NDC 00904693186

## 2024-10-29 MED ADMIN — Lidocaine Patch 5%: 2 | TRANSDERMAL | NDC 65162079104

## 2024-10-29 MED ADMIN — Insulin Glargine Inj 100 Unit/ML: 5 [IU] | SUBCUTANEOUS | NDC 00088222033

## 2024-10-29 MED ADMIN — Darbepoetin Alfa Soln Prefilled Syringe 40 MCG/0.4ML: 40 ug | SUBCUTANEOUS | NDC 55513002101

## 2024-10-29 NOTE — Plan of Care (Signed)
  Problem: Safety: Goal: Ability to remain free from injury will improve Outcome: Progressing   Problem: Fluid Volume: Goal: Ability to maintain a balanced intake and output will improve Outcome: Progressing   Problem: Nutritional: Goal: Maintenance of adequate nutrition will improve Outcome: Progressing
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
## 2024-10-29 NOTE — Plan of Care (Signed)
" °  Problem: Clinical Measurements: Goal: Will remain free from infection Outcome: Progressing   Problem: Safety: Goal: Ability to remain free from injury will improve Outcome: Progressing   Problem: Education: Goal: Ability to describe self-care measures that may prevent or decrease complications (Diabetes Survival Skills Education) will improve Outcome: Progressing   "

## 2024-10-30 LAB — GLUCOSE, CAPILLARY
Glucose-Capillary: 160 mg/dL — ABNORMAL HIGH (ref 70–99)
Glucose-Capillary: 171 mg/dL — ABNORMAL HIGH (ref 70–99)
Glucose-Capillary: 168 mg/dL — ABNORMAL HIGH (ref 70–99)
Glucose-Capillary: 179 mg/dL — ABNORMAL HIGH (ref 70–99)

## 2024-10-30 MED ADMIN — ORAL CARE MOUTH RINSE: 15 mL | OROMUCOSAL | NDC 99999080097

## 2024-10-30 MED ADMIN — Sennosides Tab 8.6 MG: 8.6 mg | ORAL | NDC 00904725280

## 2024-10-30 MED ADMIN — B-Complex w/ C & Folic Acid Tab 0.8 MG: 1 | ORAL | NDC 60258016001

## 2024-10-30 MED ADMIN — Water For Irrigation, Sterile Irrigation Soln: 50 mL | NDC 99999080061

## 2024-10-30 MED ADMIN — Amlodipine Besylate Tab 10 MG (Base Equivalent): 10 mg | ORAL | NDC 00904637161

## 2024-10-30 MED ADMIN — Insulin Aspart Inj Soln 100 Unit/ML: 4 [IU] | SUBCUTANEOUS | NDC 73070010011

## 2024-10-30 MED ADMIN — Oxycodone HCl Tab 5 MG: 5 mg | ORAL | NDC 00406055223

## 2024-10-30 MED ADMIN — Nutritional Supplement Liquid: 237 mL | ORAL | NDC 4390018629

## 2024-10-30 MED ADMIN — Acetaminophen Tab 500 MG: 1000 mg | ORAL | NDC 50580045711

## 2024-10-30 MED ADMIN — Protein Liquid (Enteral): 60 mL | NDC 9468801446

## 2024-10-30 MED ADMIN — Hydralazine HCl Tab 50 MG: 50 mg | ORAL | NDC 23155083401

## 2024-10-30 MED ADMIN — Heparin Sodium (Porcine) Inj 5000 Unit/ML: 5000 [IU] | SUBCUTANEOUS | NDC 72572025501

## 2024-10-30 MED ADMIN — Nutritional Supplement Liquid: 960 mL | NDC 7007462700

## 2024-10-30 MED ADMIN — Aspirin Chew Tab 81 MG: 81 mg | ORAL | NDC 00904679480

## 2024-10-30 MED ADMIN — Polyethylene Glycol 3350 Oral Packet 17 GM: 17 g | ORAL | NDC 00904693186

## 2024-10-30 MED ADMIN — Lidocaine Patch 5%: 2 | TRANSDERMAL | NDC 65162079104

## 2024-10-30 MED ADMIN — Insulin Glargine Inj 100 Unit/ML: 5 [IU] | SUBCUTANEOUS | NDC 00088222033
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
## 2024-10-30 NOTE — Plan of Care (Signed)
" °  Problem: Safety: Goal: Ability to remain free from injury will improve Outcome: Progressing   Problem: Fluid Volume: Goal: Ability to maintain a balanced intake and output will improve Outcome: Progressing   Problem: Nutritional: Goal: Maintenance of adequate nutrition will improve Outcome: Progressing   Problem: Skin Integrity: Goal: Risk for impaired skin integrity will decrease Outcome: Progressing   "

## 2024-10-31 ENCOUNTER — Inpatient Hospital Stay (HOSPITAL_COMMUNITY)

## 2024-10-31 LAB — GLUCOSE, CAPILLARY
Glucose-Capillary: 115 mg/dL — ABNORMAL HIGH (ref 70–99)
Glucose-Capillary: 134 mg/dL — ABNORMAL HIGH (ref 70–99)
Glucose-Capillary: 146 mg/dL — ABNORMAL HIGH (ref 70–99)
Glucose-Capillary: 173 mg/dL — ABNORMAL HIGH (ref 70–99)
Glucose-Capillary: 92 mg/dL (ref 70–99)
Glucose-Capillary: 97 mg/dL (ref 70–99)
Glucose-Capillary: 98 mg/dL (ref 70–99)

## 2024-10-31 MED ADMIN — ORAL CARE MOUTH RINSE: 15 mL | OROMUCOSAL | NDC 99999080097

## 2024-10-31 MED ADMIN — B-Complex w/ C & Folic Acid Tab 0.8 MG: 1 | ORAL | NDC 60258016001

## 2024-10-31 MED ADMIN — Water For Irrigation, Sterile Irrigation Soln: 50 mL | NDC 99999080061

## 2024-10-31 MED ADMIN — Amlodipine Besylate Tab 10 MG (Base Equivalent): 10 mg | ORAL | NDC 00904637161

## 2024-10-31 MED ADMIN — Insulin Aspart Inj Soln 100 Unit/ML: 3 [IU] | SUBCUTANEOUS | NDC 73070010011

## 2024-10-31 MED ADMIN — Insulin Aspart Inj Soln 100 Unit/ML: 4 [IU] | SUBCUTANEOUS | NDC 73070010011

## 2024-10-31 MED ADMIN — Oxycodone HCl Tab 5 MG: 5 mg | ORAL | NDC 00406055223

## 2024-10-31 MED ADMIN — Nutritional Supplement Liquid: 237 mL | ORAL | NDC 4390018629

## 2024-10-31 MED ADMIN — Acetaminophen Tab 500 MG: 1000 mg | ORAL | NDC 50580045711

## 2024-10-31 MED ADMIN — Protein Liquid (Enteral): 60 mL | NDC 9468801446

## 2024-10-31 MED ADMIN — Hydralazine HCl Tab 50 MG: 50 mg | ORAL | NDC 23155083401

## 2024-10-31 MED ADMIN — Heparin Sodium (Porcine) Inj 5000 Unit/ML: 5000 [IU] | SUBCUTANEOUS | NDC 72572025501

## 2024-10-31 MED ADMIN — Nutritional Supplement Liquid: 960 mL | NDC 7007462700

## 2024-10-31 MED ADMIN — Aspirin Chew Tab 81 MG: 81 mg | ORAL | NDC 00904679480

## 2024-10-31 MED ADMIN — Lidocaine Patch 5%: 2 | TRANSDERMAL | NDC 65162079104

## 2024-10-31 MED ADMIN — Insulin Glargine Inj 100 Unit/ML: 5 [IU] | SUBCUTANEOUS | NDC 00088222033

## 2024-10-31 MED ADMIN — Diatrizoate Meglumine & Sodium Oral Soln 66-10%: 30 mL | NDC 00270044535

## 2024-10-31 MED FILL — Diatrizoate Meglumine & Sodium Oral Soln 66-10%: ORAL | Qty: 30 | Status: AC
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
## 2024-10-31 NOTE — Progress Notes (Signed)
 Found patient lying sideways in bed with PEG removed. Messaged MD awaiting response.  Provider in surgery.  Dayshift nurse covered site with bandage.

## 2024-11-01 LAB — GLUCOSE, CAPILLARY
Glucose-Capillary: 118 mg/dL — ABNORMAL HIGH (ref 70–99)
Glucose-Capillary: 186 mg/dL — ABNORMAL HIGH (ref 70–99)
Glucose-Capillary: 101 mg/dL — ABNORMAL HIGH (ref 70–99)
Glucose-Capillary: 46 mg/dL — ABNORMAL LOW (ref 70–99)
Glucose-Capillary: 140 mg/dL — ABNORMAL HIGH (ref 70–99)
Glucose-Capillary: 133 mg/dL — ABNORMAL HIGH (ref 70–99)

## 2024-11-01 MED ADMIN — ORAL CARE MOUTH RINSE: 15 mL | OROMUCOSAL | NDC 99999080097

## 2024-11-01 MED ADMIN — Sennosides Tab 8.6 MG: 8.6 mg | ORAL | NDC 00904725261

## 2024-11-01 MED ADMIN — B-Complex w/ C & Folic Acid Tab 0.8 MG: 1 | ORAL | NDC 60258016001

## 2024-11-01 MED ADMIN — Water For Irrigation, Sterile Irrigation Soln: 50 mL | NDC 99999080061

## 2024-11-01 MED ADMIN — Amlodipine Besylate Tab 10 MG (Base Equivalent): 10 mg | ORAL | NDC 00904637161

## 2024-11-01 MED ADMIN — Insulin Aspart Inj Soln 100 Unit/ML: 4 [IU] | SUBCUTANEOUS | NDC 73070010011

## 2024-11-01 MED ADMIN — Insulin Aspart Inj Soln 100 Unit/ML: 3 [IU] | SUBCUTANEOUS | NDC 73070010011

## 2024-11-01 MED ADMIN — Oxycodone HCl Tab 5 MG: 5 mg | ORAL | NDC 00406055223

## 2024-11-01 MED ADMIN — Nutritional Supplement Liquid: 237 mL | ORAL | NDC 4390018629

## 2024-11-01 MED ADMIN — Acetaminophen Tab 500 MG: 1000 mg | ORAL | NDC 50580045711

## 2024-11-01 MED ADMIN — Protein Liquid (Enteral): 60 mL | NDC 9468801446

## 2024-11-01 MED ADMIN — Hydralazine HCl Tab 50 MG: 50 mg | ORAL | NDC 23155083401

## 2024-11-01 MED ADMIN — Heparin Sodium (Porcine) Inj 5000 Unit/ML: 5000 [IU] | SUBCUTANEOUS | NDC 72572025501

## 2024-11-01 MED ADMIN — Nutritional Supplement Liquid: 960 mL | NDC 7007457472

## 2024-11-01 MED ADMIN — Aspirin Chew Tab 81 MG: 81 mg | ORAL | NDC 00904679480

## 2024-11-01 MED ADMIN — Polyethylene Glycol 3350 Oral Packet 17 GM: 17 g | ORAL | NDC 00904693186

## 2024-11-01 MED ADMIN — Lidocaine Patch 5%: 2 | TRANSDERMAL | NDC 65162079104

## 2024-11-01 NOTE — Progress Notes (Signed)
 Notified from NT that pt BS is low. Gave pt 2 cups of orange juice and asked NT to recheck BS in 30 mins

## 2024-11-01 NOTE — Progress Notes (Signed)
" °   11/01/24 1007  Vitals  BP 134/75  BP Location Right Arm  BP Method Automatic  Patient Position (if appropriate) Lying  Pulse Rate 87  Pulse Rate Source Dinamap  Resp 18  Level of Consciousness  Level of Consciousness Alert  MEWS COLOR  MEWS Score Color Green  Oxygen Therapy  SpO2 100 %  O2 Device Room Air  Pain Assessment  Pain Scale Faces  Pain Score 0  MEWS Score  MEWS Temp 0  MEWS Systolic 0  MEWS Pulse 0  MEWS RR 0  MEWS LOC 0  MEWS Score 0    "

## 2024-11-01 NOTE — Progress Notes (Signed)
 BS 101

## 2024-11-01 NOTE — Plan of Care (Signed)
  Problem: Clinical Measurements: Goal: Ability to maintain clinical measurements within normal limits will improve Outcome: Progressing Goal: Will remain free from infection Outcome: Progressing   Problem: Safety: Goal: Ability to remain free from injury will improve Outcome: Progressing   
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
## 2024-11-01 NOTE — Progress Notes (Signed)
" °   11/01/24 1438  Vitals  BP 126/72  BP Location Right Arm  BP Method Automatic  Patient Position (if appropriate) Lying  Pulse Rate 81  Pulse Rate Source Dinamap  Level of Consciousness  Level of Consciousness Alert  Oxygen Therapy  SpO2 100 %  O2 Device Room Air  Pain Assessment  Pain Scale Faces  Pain Score 0    "

## 2024-11-02 LAB — GLUCOSE, CAPILLARY
Glucose-Capillary: 139 mg/dL — ABNORMAL HIGH (ref 70–99)
Glucose-Capillary: 192 mg/dL — ABNORMAL HIGH (ref 70–99)
Glucose-Capillary: 138 mg/dL — ABNORMAL HIGH (ref 70–99)
Glucose-Capillary: 120 mg/dL — ABNORMAL HIGH (ref 70–99)
Glucose-Capillary: 187 mg/dL — ABNORMAL HIGH (ref 70–99)
Glucose-Capillary: 163 mg/dL — ABNORMAL HIGH (ref 70–99)

## 2024-11-02 MED ADMIN — ORAL CARE MOUTH RINSE: 15 mL | OROMUCOSAL | NDC 99999080097

## 2024-11-02 MED ADMIN — Sennosides Tab 8.6 MG: 8.6 mg | ORAL | NDC 00904725280

## 2024-11-02 MED ADMIN — B-Complex w/ C & Folic Acid Tab 0.8 MG: 1 | ORAL | NDC 60258016001

## 2024-11-02 MED ADMIN — Water For Irrigation, Sterile Irrigation Soln: 50 mL | NDC 99999080061

## 2024-11-02 MED ADMIN — Amlodipine Besylate Tab 10 MG (Base Equivalent): 10 mg | ORAL | NDC 00904637161

## 2024-11-02 MED ADMIN — Insulin Aspart Inj Soln 100 Unit/ML: 3 [IU] | SUBCUTANEOUS | NDC 73070010011

## 2024-11-02 MED ADMIN — Insulin Aspart Inj Soln 100 Unit/ML: 4 [IU] | SUBCUTANEOUS | NDC 73070010011

## 2024-11-02 MED ADMIN — Oxycodone HCl Tab 5 MG: 5 mg | ORAL | NDC 00406055223

## 2024-11-02 MED ADMIN — Nutritional Supplement Liquid: 237 mL | ORAL | NDC 4390018629

## 2024-11-02 MED ADMIN — Acetaminophen Tab 500 MG: 1000 mg | ORAL | NDC 50580045711

## 2024-11-02 MED ADMIN — Protein Liquid (Enteral): 60 mL | NDC 9468801446

## 2024-11-02 MED ADMIN — Hydralazine HCl Tab 50 MG: 50 mg | ORAL | NDC 23155083401

## 2024-11-02 MED ADMIN — Heparin Sodium (Porcine) Inj 5000 Unit/ML: 5000 [IU] | SUBCUTANEOUS | NDC 72572025501

## 2024-11-02 MED ADMIN — Aspirin Chew Tab 81 MG: 81 mg | ORAL | NDC 00904679480

## 2024-11-02 MED ADMIN — Polyethylene Glycol 3350 Oral Packet 17 GM: 17 g | ORAL | NDC 00904693186

## 2024-11-02 MED ADMIN — Lidocaine Patch 5%: 2 | TRANSDERMAL | NDC 65162079104

## 2024-11-02 MED ADMIN — Hydromorphone HCl Inj 1 MG/ML: 1 mg | INTRAVENOUS | NDC 76045000901

## 2024-11-02 MED FILL — Hydromorphone HCl Inj 1 MG/ML: 1.0000 mg | INTRAMUSCULAR | Qty: 1 | Status: AC
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
## 2024-11-02 NOTE — Progress Notes (Signed)
 SLP Cancellation Note  Patient Details Name: Anup Brigham MRN: [id number] DOB: February 04, 1956   Cancelled treatment:       Reason Eval/Treat Not Completed: Other (comment) attempted to see pt but he was receiving nursing care. Waited for a little while, but will need to reattempt at another time.     Leita SAILOR., M.A. CCC-SLP Acute Rehabilitation Services Office: [phone]  Secure chat preferred  11/02/2024, 1:16 PM
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
## 2024-11-02 NOTE — Plan of Care (Signed)
   Problem: Safety: Goal: Ability to remain free from injury will improve Outcome: Progressing

## 2024-11-03 LAB — GLUCOSE, CAPILLARY
Glucose-Capillary: 101 mg/dL — ABNORMAL HIGH (ref 70–99)
Glucose-Capillary: 112 mg/dL — ABNORMAL HIGH (ref 70–99)
Glucose-Capillary: 128 mg/dL — ABNORMAL HIGH (ref 70–99)
Glucose-Capillary: 133 mg/dL — ABNORMAL HIGH (ref 70–99)
Glucose-Capillary: 148 mg/dL — ABNORMAL HIGH (ref 70–99)
Glucose-Capillary: 175 mg/dL — ABNORMAL HIGH (ref 70–99)

## 2024-11-03 LAB — BASIC METABOLIC PANEL WITH GFR
Anion gap: 11 (ref 5–15)
BUN: 55 mg/dL — ABNORMAL HIGH (ref 8–23)
CO2: 25 mmol/L (ref 22–32)
Calcium: 9.5 mg/dL (ref 8.9–10.3)
Chloride: 104 mmol/L (ref 98–111)
Creatinine, Ser: 1.64 mg/dL — ABNORMAL HIGH (ref 0.61–1.24)
GFR, Estimated: 46 mL/min — ABNORMAL LOW
Glucose, Bld: 205 mg/dL — ABNORMAL HIGH (ref 70–99)
Potassium: 4.9 mmol/L (ref 3.5–5.1)
Sodium: 140 mmol/L (ref 135–145)

## 2024-11-03 LAB — CBC
HCT: 25.4 % — ABNORMAL LOW (ref 39.0–52.0)
Hemoglobin: 7.9 g/dL — ABNORMAL LOW (ref 13.0–17.0)
MCH: 27.6 pg (ref 26.0–34.0)
MCHC: 31.1 g/dL (ref 30.0–36.0)
MCV: 88.8 fL (ref 80.0–100.0)
Platelets: 314 K/uL (ref 150–400)
RBC: 2.86 MIL/uL — ABNORMAL LOW (ref 4.22–5.81)
RDW: 18.1 % — ABNORMAL HIGH (ref 11.5–15.5)
WBC: 9.3 K/uL (ref 4.0–10.5)
nRBC: 0 % (ref 0.0–0.2)

## 2024-11-03 MED ADMIN — ORAL CARE MOUTH RINSE: 15 mL | OROMUCOSAL | NDC 99999080097

## 2024-11-03 MED ADMIN — Sennosides Tab 8.6 MG: 8.6 mg | ORAL | NDC 00904725280

## 2024-11-03 MED ADMIN — B-Complex w/ C & Folic Acid Tab 0.8 MG: 1 | ORAL | NDC 60258016001

## 2024-11-03 MED ADMIN — Water For Irrigation, Sterile Irrigation Soln: 50 mL | NDC 99999080061

## 2024-11-03 MED ADMIN — Amlodipine Besylate Tab 10 MG (Base Equivalent): 10 mg | ORAL | NDC 00904637161

## 2024-11-03 MED ADMIN — Insulin Aspart Inj Soln 100 Unit/ML: 3 [IU] | SUBCUTANEOUS | NDC 73070010011

## 2024-11-03 MED ADMIN — Insulin Aspart Inj Soln 100 Unit/ML: 4 [IU] | SUBCUTANEOUS | NDC 73070010011

## 2024-11-03 MED ADMIN — Oxycodone HCl Tab 5 MG: 5 mg | ORAL | NDC 00406055223

## 2024-11-03 MED ADMIN — Nutritional Supplement Liquid: 237 mL | ORAL | NDC 4390018629

## 2024-11-03 MED ADMIN — Acetaminophen Tab 500 MG: 1000 mg | ORAL | NDC 50580045711

## 2024-11-03 MED ADMIN — Protein Liquid (Enteral): 60 mL | NDC 9468801446

## 2024-11-03 MED ADMIN — Hydralazine HCl Tab 50 MG: 50 mg | ORAL | NDC 23155083401

## 2024-11-03 MED ADMIN — Heparin Sodium (Porcine) Inj 5000 Unit/ML: 5000 [IU] | SUBCUTANEOUS | NDC 72572025501

## 2024-11-03 MED ADMIN — Nutritional Supplement Liquid: 960 mL | NDC 7007462700

## 2024-11-03 MED ADMIN — Aspirin Chew Tab 81 MG: 81 mg | ORAL | NDC 00904679480

## 2024-11-03 MED ADMIN — Polyethylene Glycol 3350 Oral Packet 17 GM: 17 g | ORAL | NDC 00904693186

## 2024-11-03 MED ADMIN — Hydromorphone HCl Inj 1 MG/ML: 1 mg | INTRAVENOUS | NDC 76045000901

## 2024-11-03 NOTE — Progress Notes (Signed)
 Pt refused morning vital. Pt would not stay still and was constantly waving his arm

## 2024-11-03 NOTE — TOC Progression Note (Addendum)
 Transition of Care [facility]) - Progression Note    Patient Details  Name: Scott Greer MRN: [id number] Date of Birth: April 22, 1956  Transition of [facility]) CM/SW Contact  [name] E [name], LCSW Phone Number: 11/03/2024, 12:41 PM  Clinical Narrative:    Spoke with DSS Worker [name]. She states she has an ALF that may consider patient [facility]). Asked her to confirm they know he has a PEG tube but is on dys 1 diet right now and needs a hoyer lift as well as follow up therapies at DC. [name] inquired if patient can manage his own PEG tube, explained that patient is impulsive and has poor attention at this time so that would not be appropriate. Keoda asked if patient could transfer with a sliding board instead - CSW asked PT about this. [name] states she will follow up with the facility and provide updates to CSW as available. She requests FL2 be updated to ALF - sent updated FL2.  1:05- Per [name] [name] cannot accept with PEG.  Per PT, patient not appropriate for slide board at this time. Patient needs hoyer lift with an amputee sling.     Barriers to Discharge: Continued Medical Work up               Expected Discharge Plan and Services       Living arrangements for the past 2 months: Homeless Expected Discharge Date: 10/26/24                                     Social Drivers of Health (SDOH) Interventions SDOH Screenings   Food Insecurity: Food Insecurity Present (10/01/2024)  Housing: High Risk (10/01/2024)  Tobacco Use: Low Risk (10/16/2024)    Readmission Risk Interventions     No data to display
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
## 2024-11-03 NOTE — Plan of Care (Signed)
  Problem: Safety: Goal: Ability to remain free from injury will improve Outcome: Progressing   Problem: Skin Integrity: Goal: Risk for impaired skin integrity will decrease Outcome: Progressing   Problem: Pain Management: Goal: Pain level will decrease Outcome: Progressing   
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
## 2024-11-03 NOTE — Progress Notes (Signed)
" °   11/03/24 1441  Vitals  BP (!) 141/84  BP Location Right Arm  BP Method Automatic  Patient Position (if appropriate) Lying  Pulse Rate 80  Pulse Rate Source Dinamap  Resp 17  Level of Consciousness  Level of Consciousness Alert  MEWS COLOR  MEWS Score Color Green  Oxygen Therapy  SpO2 100 %  O2 Device Room Air  Pain Assessment  Pain Scale Faces  Pain Score 0  MEWS Score  MEWS Temp 0  MEWS Systolic 0  MEWS Pulse 0  MEWS RR 0  MEWS LOC 0  MEWS Score 0    "
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
## 2024-11-04 LAB — GLUCOSE, CAPILLARY
Glucose-Capillary: 120 mg/dL — ABNORMAL HIGH (ref 70–99)
Glucose-Capillary: 132 mg/dL — ABNORMAL HIGH (ref 70–99)
Glucose-Capillary: 147 mg/dL — ABNORMAL HIGH (ref 70–99)
Glucose-Capillary: 164 mg/dL — ABNORMAL HIGH (ref 70–99)
Glucose-Capillary: 98 mg/dL (ref 70–99)

## 2024-11-04 MED ADMIN — ORAL CARE MOUTH RINSE: 15 mL | OROMUCOSAL | NDC 99999080097

## 2024-11-04 MED ADMIN — Sennosides Tab 8.6 MG: 8.6 mg | ORAL | NDC 00904725280

## 2024-11-04 MED ADMIN — B-Complex w/ C & Folic Acid Tab 0.8 MG: 1 | ORAL | NDC 60258016001

## 2024-11-04 MED ADMIN — Water For Irrigation, Sterile Irrigation Soln: 50 mL | NDC 99999080061

## 2024-11-04 MED ADMIN — Amlodipine Besylate Tab 10 MG (Base Equivalent): 10 mg | ORAL | NDC 00904637161

## 2024-11-04 MED ADMIN — Insulin Aspart Inj Soln 100 Unit/ML: 3 [IU] | SUBCUTANEOUS | NDC 73070010011

## 2024-11-04 MED ADMIN — Insulin Aspart Inj Soln 100 Unit/ML: 4 [IU] | SUBCUTANEOUS | NDC 73070010011

## 2024-11-04 MED ADMIN — Oxycodone HCl Tab 5 MG: 5 mg | ORAL | NDC 00406055223

## 2024-11-04 MED ADMIN — Nutritional Supplement Liquid: 237 mL | ORAL | NDC 4390018629

## 2024-11-04 MED ADMIN — Acetaminophen Tab 500 MG: 1000 mg | ORAL | NDC 50580045711

## 2024-11-04 MED ADMIN — Protein Liquid (Enteral): 60 mL | NDC 9468801446

## 2024-11-04 MED ADMIN — Hydralazine HCl Tab 50 MG: 50 mg | ORAL | NDC 23155083401

## 2024-11-04 MED ADMIN — Heparin Sodium (Porcine) Inj 5000 Unit/ML: 5000 [IU] | SUBCUTANEOUS | NDC 72572025501

## 2024-11-04 MED ADMIN — Nutritional Supplement Liquid: 960 mL | NDC 7007457472

## 2024-11-04 MED ADMIN — Aspirin Chew Tab 81 MG: 81 mg | ORAL | NDC 00904679480

## 2024-11-04 MED ADMIN — Polyethylene Glycol 3350 Oral Packet 17 GM: 17 g | ORAL | NDC 00904693186

## 2024-11-04 NOTE — Plan of Care (Signed)
  Problem: Clinical Measurements: Goal: Ability to maintain clinical measurements within normal limits will improve Outcome: Progressing Goal: Will remain free from infection Outcome: Progressing Goal: Diagnostic test results will improve Outcome: Progressing Goal: Respiratory complications will improve Outcome: Progressing Goal: Cardiovascular complication will be avoided Outcome: Progressing   Problem: Safety: Goal: Ability to remain free from injury will improve Outcome: Progressing   Problem: Skin Integrity: Goal: Risk for impaired skin integrity will decrease Outcome: Progressing   Problem: Education: Goal: Ability to describe self-care measures that may prevent or decrease complications (Diabetes Survival Skills Education) will improve Outcome: Progressing Goal: Individualized Educational Video(s) Outcome: Progressing   Problem: Coping: Goal: Ability to adjust to condition or change in health will improve Outcome: Progressing   Problem: Fluid Volume: Goal: Ability to maintain a balanced intake and output will improve Outcome: Progressing   Problem: Health Behavior/Discharge Planning: Goal: Ability to identify and utilize available resources and services will improve Outcome: Progressing Goal: Ability to manage health-related needs will improve Outcome: Progressing   Problem: Metabolic: Goal: Ability to maintain appropriate glucose levels will improve Outcome: Progressing   Problem: Nutritional: Goal: Maintenance of adequate nutrition will improve Outcome: Progressing Goal: Progress toward achieving an optimal weight will improve Outcome: Progressing   Problem: Skin Integrity: Goal: Risk for impaired skin integrity will decrease Outcome: Progressing   Problem: Tissue Perfusion: Goal: Adequacy of tissue perfusion will improve Outcome: Progressing   Problem: Education: Goal: Verbalization of understanding the information provided (i.e., activity  precautions, restrictions, etc) will improve Outcome: Progressing Goal: Individualized Educational Video(s) Outcome: Progressing   Problem: Activity: Goal: Ability to ambulate and perform ADLs will improve Outcome: Progressing   Problem: Clinical Measurements: Goal: Postoperative complications will be avoided or minimized Outcome: Progressing   Problem: Self-Concept: Goal: Ability to maintain and perform role responsibilities to the fullest extent possible will improve Outcome: Progressing   Problem: Pain Management: Goal: Pain level will decrease Outcome: Progressing   Problem: Education: Goal: Verbalization of understanding the information provided (i.e., activity precautions, restrictions, etc) will improve Outcome: Progressing Goal: Individualized Educational Video(s) Outcome: Progressing   Problem: Activity: Goal: Ability to ambulate and perform ADLs will improve Outcome: Progressing   Problem: Clinical Measurements: Goal: Postoperative complications will be avoided or minimized Outcome: Progressing   Problem: Self-Concept: Goal: Ability to maintain and perform role responsibilities to the fullest extent possible will improve Outcome: Progressing   Problem: Pain Management: Goal: Pain level will decrease Outcome: Progressing   Problem: Education: Goal: Ability to describe self-care measures that may prevent or decrease complications (Diabetes Survival Skills Education) will improve Outcome: Progressing Goal: Individualized Educational Video(s) Outcome: Progressing   Problem: Coping: Goal: Ability to adjust to condition or change in health will improve Outcome: Progressing   Problem: Fluid Volume: Goal: Ability to maintain a balanced intake and output will improve Outcome: Progressing   Problem: Health Behavior/Discharge Planning: Goal: Ability to identify and utilize available resources and services will improve Outcome: Progressing Goal: Ability to  manage health-related needs will improve Outcome: Progressing   Problem: Metabolic: Goal: Ability to maintain appropriate glucose levels will improve Outcome: Progressing   Problem: Nutritional: Goal: Maintenance of adequate nutrition will improve Outcome: Progressing Goal: Progress toward achieving an optimal weight will improve Outcome: Progressing   Problem: Skin Integrity: Goal: Risk for impaired skin integrity will decrease Outcome: Progressing   Problem: Tissue Perfusion: Goal: Adequacy of tissue perfusion will improve Outcome: Progressing
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
## 2024-11-04 NOTE — Plan of Care (Signed)
  Problem: Skin Integrity: Goal: Risk for impaired skin integrity will decrease Outcome: Progressing   Problem: Coping: Goal: Ability to adjust to condition or change in health will improve Outcome: Progressing

## 2024-11-05 LAB — GLUCOSE, CAPILLARY
Glucose-Capillary: 142 mg/dL — ABNORMAL HIGH (ref 70–99)
Glucose-Capillary: 132 mg/dL — ABNORMAL HIGH (ref 70–99)
Glucose-Capillary: 150 mg/dL — ABNORMAL HIGH (ref 70–99)
Glucose-Capillary: 135 mg/dL — ABNORMAL HIGH (ref 70–99)
Glucose-Capillary: 188 mg/dL — ABNORMAL HIGH (ref 70–99)
Glucose-Capillary: 143 mg/dL — ABNORMAL HIGH (ref 70–99)

## 2024-11-05 MED ADMIN — ORAL CARE MOUTH RINSE: 15 mL | OROMUCOSAL | NDC 99999080097

## 2024-11-05 MED ADMIN — Sennosides Tab 8.6 MG: 8.6 mg | ORAL | NDC 00904725280

## 2024-11-05 MED ADMIN — Water For Irrigation, Sterile Irrigation Soln: 50 mL | NDC 99999080061

## 2024-11-05 MED ADMIN — Amlodipine Besylate Tab 10 MG (Base Equivalent): 10 mg | ORAL | NDC 00904637161

## 2024-11-05 MED ADMIN — Insulin Aspart Inj Soln 100 Unit/ML: 3 [IU] | SUBCUTANEOUS | NDC 73070010011

## 2024-11-05 MED ADMIN — Insulin Aspart Inj Soln 100 Unit/ML: 4 [IU] | SUBCUTANEOUS | NDC 73070010011

## 2024-11-05 MED ADMIN — Oxycodone HCl Tab 5 MG: 5 mg | ORAL | NDC 00406055223

## 2024-11-05 MED ADMIN — Nutritional Supplement Liquid: 237 mL | ORAL | NDC 4390018629

## 2024-11-05 MED ADMIN — Acetaminophen Tab 500 MG: 1000 mg | ORAL | NDC 50580045711

## 2024-11-05 MED ADMIN — Protein Liquid (Enteral): 60 mL | NDC 9468801446

## 2024-11-05 MED ADMIN — Hydralazine HCl Tab 50 MG: 50 mg | ORAL | NDC 23155083401

## 2024-11-05 MED ADMIN — Heparin Sodium (Porcine) Inj 5000 Unit/ML: 5000 [IU] | SUBCUTANEOUS | NDC 72572025501

## 2024-11-05 MED ADMIN — Nutritional Supplement Liquid: 960 mL | NDC 7007462700

## 2024-11-05 MED ADMIN — Aspirin Chew Tab 81 MG: 81 mg | ORAL | NDC 00904679480

## 2024-11-05 MED ADMIN — Polyethylene Glycol 3350 Oral Packet 17 GM: 17 g | ORAL | NDC 00904693186

## 2024-11-05 MED ADMIN — Darbepoetin Alfa Soln Prefilled Syringe 40 MCG/0.4ML: 40 ug | SUBCUTANEOUS | NDC 55513002101

## 2024-11-05 MED FILL — Diphenhydramine HCl Inj 50 MG/ML: 50.0000 mg | INTRAMUSCULAR | Qty: 1 | Status: AC
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
## 2024-11-05 NOTE — Progress Notes (Signed)
 Paged provider for an order to help with Pt, He's combative, biting and very agitated.

## 2024-11-06 LAB — GLUCOSE, CAPILLARY
Glucose-Capillary: 163 mg/dL — ABNORMAL HIGH (ref 70–99)
Glucose-Capillary: 196 mg/dL — ABNORMAL HIGH (ref 70–99)
Glucose-Capillary: 145 mg/dL — ABNORMAL HIGH (ref 70–99)
Glucose-Capillary: 192 mg/dL — ABNORMAL HIGH (ref 70–99)
Glucose-Capillary: 147 mg/dL — ABNORMAL HIGH (ref 70–99)

## 2024-11-06 MED ADMIN — Sennosides Tab 8.6 MG: 8.6 mg | ORAL | NDC 00904725280

## 2024-11-06 MED ADMIN — B-Complex w/ C & Folic Acid Tab 0.8 MG: 1 | ORAL | NDC 60258016001

## 2024-11-06 MED ADMIN — Water For Irrigation, Sterile Irrigation Soln: 50 mL | NDC 99999080061

## 2024-11-06 MED ADMIN — Amlodipine Besylate Tab 10 MG (Base Equivalent): 10 mg | ORAL | NDC 00904637161

## 2024-11-06 MED ADMIN — Insulin Aspart Inj Soln 100 Unit/ML: 4 [IU] | SUBCUTANEOUS | NDC 73070010011

## 2024-11-06 MED ADMIN — Insulin Aspart Inj Soln 100 Unit/ML: 3 [IU] | SUBCUTANEOUS | NDC 73070010011

## 2024-11-06 MED ADMIN — Oxycodone HCl Tab 5 MG: 5 mg | ORAL | NDC 00406055223

## 2024-11-06 MED ADMIN — Nutritional Supplement Liquid: 237 mL | ORAL | NDC 4390018629

## 2024-11-06 MED ADMIN — Acetaminophen Tab 500 MG: 1000 mg | ORAL | NDC 50580045711

## 2024-11-06 MED ADMIN — Protein Liquid (Enteral): 60 mL | NDC 9468801446

## 2024-11-06 MED ADMIN — Lorazepam Inj 2 MG/ML: 1 mg | INTRAMUSCULAR | NDC 65219036801

## 2024-11-06 MED ADMIN — Hydralazine HCl Tab 50 MG: 50 mg | ORAL | NDC 23155083401

## 2024-11-06 MED ADMIN — Heparin Sodium (Porcine) Inj 5000 Unit/ML: 5000 [IU] | SUBCUTANEOUS | NDC 72572025501

## 2024-11-06 MED ADMIN — Nutritional Supplement Liquid: 960 mL | NDC 7007462700

## 2024-11-06 MED ADMIN — Aspirin Chew Tab 81 MG: 81 mg | ORAL | NDC 00904679480

## 2024-11-06 MED ADMIN — Polyethylene Glycol 3350 Oral Packet 17 GM: 17 g | ORAL | NDC 00904693186

## 2024-11-06 MED ADMIN — Lidocaine Patch 5%: 2 | TRANSDERMAL | NDC 65162079104

## 2024-11-06 MED ADMIN — Diphenhydramine HCl Inj 50 MG/ML: 50 mg | INTRAVENOUS | NDC 00641037621

## 2024-11-06 MED ADMIN — Haloperidol Lactate Inj 5 MG/ML: 5 mg | INTRAVENOUS | NDC 67457042600

## 2024-11-06 MED FILL — Diphenhydramine HCl Inj 50 MG/ML: 50.0000 mg | INTRAMUSCULAR | Qty: 1 | Status: AC

## 2024-11-06 MED FILL — Lorazepam Inj 2 MG/ML: 1.0000 mg | INTRAMUSCULAR | Qty: 1 | Status: AC

## 2024-11-06 MED FILL — Haloperidol Lactate Inj 5 MG/ML: 5.0000 mg | INTRAMUSCULAR | Qty: 1 | Status: AC

## 2024-11-06 MED FILL — Olanzapine Orally Disintegrating Tab 5 MG: 10.0000 mg | ORAL | Qty: 2 | Status: AC

## 2024-11-06 MED FILL — Olanzapine Orally Disintegrating Tab 5 MG: 5.0000 mg | ORAL | Qty: 1 | Status: AC

## 2024-11-06 NOTE — Progress Notes (Signed)
 Patient up and is screaming and cursing at staff. Benedryl given for agitation.
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
## 2024-11-06 NOTE — Plan of Care (Signed)
" °  Problem: Clinical Measurements: Goal: Ability to maintain clinical measurements within normal limits will improve Outcome: Progressing   Problem: Safety: Goal: Ability to remain free from injury will improve Outcome: Progressing   Problem: Skin Integrity: Goal: Risk for impaired skin integrity will decrease Outcome: Progressing   Problem: Education: Goal: Ability to describe self-care measures that may prevent or decrease complications (Diabetes Survival Skills Education) will improve Outcome: Progressing   "
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
## 2024-11-07 LAB — GLUCOSE, CAPILLARY
Glucose-Capillary: 156 mg/dL — ABNORMAL HIGH (ref 70–99)
Glucose-Capillary: 140 mg/dL — ABNORMAL HIGH (ref 70–99)
Glucose-Capillary: 190 mg/dL — ABNORMAL HIGH (ref 70–99)
Glucose-Capillary: 143 mg/dL — ABNORMAL HIGH (ref 70–99)

## 2024-11-07 MED ADMIN — ORAL CARE MOUTH RINSE: 15 mL | OROMUCOSAL | NDC 99999080097

## 2024-11-07 MED ADMIN — B-Complex w/ C & Folic Acid Tab 0.8 MG: 1 | ORAL | NDC 60258016001

## 2024-11-07 MED ADMIN — Water For Irrigation, Sterile Irrigation Soln: 50 mL | NDC 99999080061

## 2024-11-07 MED ADMIN — Insulin Aspart Inj Soln 100 Unit/ML: 3 [IU] | SUBCUTANEOUS | NDC 73070010011

## 2024-11-07 MED ADMIN — Insulin Aspart Inj Soln 100 Unit/ML: 4 [IU] | SUBCUTANEOUS | NDC 73070010011

## 2024-11-07 MED ADMIN — Oxycodone HCl Tab 5 MG: 5 mg | ORAL | NDC 00406055223

## 2024-11-07 MED ADMIN — Nutritional Supplement Liquid: 237 mL | ORAL | NDC 4390018629

## 2024-11-07 MED ADMIN — Acetaminophen Tab 500 MG: 1000 mg | ORAL | NDC 50580045711

## 2024-11-07 MED ADMIN — Protein Liquid (Enteral): 60 mL | NDC 9468801446

## 2024-11-07 MED ADMIN — Hydralazine HCl Tab 50 MG: 50 mg | ORAL | NDC 23155083401

## 2024-11-07 MED ADMIN — Heparin Sodium (Porcine) Inj 5000 Unit/ML: 5000 [IU] | SUBCUTANEOUS | NDC 72572025501

## 2024-11-07 MED ADMIN — Nutritional Supplement Liquid: 960 mL | NDC 7007462700

## 2024-11-07 MED ADMIN — Olanzapine Orally Disintegrating Tab 5 MG: 10 mg | NDC 60505327503

## 2024-11-07 MED ADMIN — Lorazepam Inj 2 MG/ML: 1 mg | INTRAVENOUS | NDC 65219036801

## 2024-11-07 NOTE — Plan of Care (Signed)
" °  Problem: Clinical Measurements: Goal: Ability to maintain clinical measurements within normal limits will improve Outcome: Progressing Goal: Will remain free from infection Outcome: Progressing Goal: Diagnostic test results will improve Outcome: Progressing Goal: Respiratory complications will improve Outcome: Progressing Goal: Cardiovascular complication will be avoided Outcome: Progressing   Problem: Safety: Goal: Ability to remain free from injury will improve Outcome: Not Progressing   Problem: Coping: Goal: Ability to adjust to condition or change in health will improve Outcome: Not Progressing   "
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
## 2024-11-07 NOTE — Plan of Care (Signed)
   Problem: Clinical Measurements: Goal: Ability to maintain clinical measurements within normal limits will improve Outcome: Progressing   Problem: Safety: Goal: Ability to remain free from injury will improve Outcome: Progressing   Problem: Skin Integrity: Goal: Risk for impaired skin integrity will decrease Outcome: Progressing

## 2024-11-08 LAB — GLUCOSE, CAPILLARY
Glucose-Capillary: 198 mg/dL — ABNORMAL HIGH (ref 70–99)
Glucose-Capillary: 145 mg/dL — ABNORMAL HIGH (ref 70–99)
Glucose-Capillary: 117 mg/dL — ABNORMAL HIGH (ref 70–99)

## 2024-11-08 MED ADMIN — ORAL CARE MOUTH RINSE: 15 mL | OROMUCOSAL | NDC 99999080097

## 2024-11-08 MED ADMIN — Sennosides Tab 8.6 MG: 8.6 mg | ORAL | NDC 00904725280

## 2024-11-08 MED ADMIN — B-Complex w/ C & Folic Acid Tab 0.8 MG: 1 | ORAL | NDC 60258016001

## 2024-11-08 MED ADMIN — Water For Irrigation, Sterile Irrigation Soln: 50 mL | NDC 99999080061

## 2024-11-08 MED ADMIN — Amlodipine Besylate Tab 10 MG (Base Equivalent): 10 mg | ORAL | NDC 00904637161

## 2024-11-08 MED ADMIN — Insulin Aspart Inj Soln 100 Unit/ML: 3 [IU] | SUBCUTANEOUS | NDC 73070010011

## 2024-11-08 MED ADMIN — Insulin Aspart Inj Soln 100 Unit/ML: 4 [IU] | SUBCUTANEOUS | NDC 73070010011

## 2024-11-08 MED ADMIN — Oxycodone HCl Tab 5 MG: 5 mg | ORAL | NDC 00406055223

## 2024-11-08 MED ADMIN — Nutritional Supplement Liquid: 237 mL | ORAL | NDC 4390018629

## 2024-11-08 MED ADMIN — Acetaminophen Tab 500 MG: 1000 mg | ORAL | NDC 50580045711

## 2024-11-08 MED ADMIN — Protein Liquid (Enteral): 60 mL | NDC 9468801446

## 2024-11-08 MED ADMIN — Hydralazine HCl Tab 50 MG: 50 mg | ORAL | NDC 23155083401

## 2024-11-08 MED ADMIN — Heparin Sodium (Porcine) Inj 5000 Unit/ML: 5000 [IU] | SUBCUTANEOUS | NDC 72572025501

## 2024-11-08 MED ADMIN — Nutritional Supplement Liquid: 960 mL | NDC 7007462700

## 2024-11-08 MED ADMIN — Aspirin Chew Tab 81 MG: 81 mg | ORAL | NDC 00904679480

## 2024-11-08 MED ADMIN — Polyethylene Glycol 3350 Oral Packet 17 GM: 17 g | ORAL | NDC 00904693186

## 2024-11-08 MED ADMIN — Olanzapine Orally Disintegrating Tab 5 MG: 10 mg | NDC 60505327503

## 2024-11-08 NOTE — Plan of Care (Signed)
  Problem: Safety: Goal: Ability to remain free from injury will improve Outcome: Progressing   Problem: Skin Integrity: Goal: Risk for impaired skin integrity will decrease Outcome: Progressing   Problem: Education: Goal: Ability to describe self-care measures that may prevent or decrease complications (Diabetes Survival Skills Education) will improve Outcome: Progressing
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
## 2024-11-09 LAB — GLUCOSE, CAPILLARY
Glucose-Capillary: 164 mg/dL — ABNORMAL HIGH (ref 70–99)
Glucose-Capillary: 186 mg/dL — ABNORMAL HIGH (ref 70–99)

## 2024-11-09 MED ADMIN — Sennosides Tab 8.6 MG: 8.6 mg | ORAL | NDC 00904725280

## 2024-11-09 MED ADMIN — B-Complex w/ C & Folic Acid Tab 0.8 MG: 1 | ORAL | NDC 60258016001

## 2024-11-09 MED ADMIN — Water For Irrigation, Sterile Irrigation Soln: 50 mL | NDC 99999080061

## 2024-11-09 MED ADMIN — Amlodipine Besylate Tab 10 MG (Base Equivalent): 10 mg | ORAL | NDC 00904637161

## 2024-11-09 MED ADMIN — Insulin Aspart Inj Soln 100 Unit/ML: 4 [IU] | SUBCUTANEOUS | NDC 73070010011

## 2024-11-09 MED ADMIN — Protein Liquid (Enteral): 60 mL | NDC 9468801446

## 2024-11-09 MED ADMIN — Nutritional Supplement Liquid: 960 mL | NDC 7007457472

## 2024-11-09 MED ADMIN — Hydralazine HCl Tab 50 MG: 50 mg | ORAL | NDC 23155083401

## 2024-11-09 MED ADMIN — Heparin Sodium (Porcine) Inj 5000 Unit/ML: 5000 [IU] | SUBCUTANEOUS | NDC 72572025501

## 2024-11-09 MED ADMIN — Aspirin Chew Tab 81 MG: 81 mg | ORAL | NDC 00904679480

## 2024-11-09 MED ADMIN — Polyethylene Glycol 3350 Oral Packet 17 GM: 17 g | ORAL | NDC 00904693186

## 2024-11-09 MED ADMIN — Lidocaine Patch 5%: 2 | TRANSDERMAL | NDC 82347050504

## 2024-11-09 MED ADMIN — Olanzapine Orally Disintegrating Tab 5 MG: 10 mg | NDC 60505327503

## 2024-11-09 MED FILL — Nutritional Supplement Liquid: 960.0000 mL | ORAL | Qty: 1000 | Status: AC

## 2024-11-09 NOTE — TOC Progression Note (Addendum)
 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 [facility] still unable to accept patient. Sent updated clinicals out for SNF.      Barriers to Discharge: Continued Medical Work up               Expected Discharge Plan and Services       Living arrangements for the past 2 months: Homeless Expected Discharge Date: 10/26/24                                     Social Drivers of Health (SDOH) Interventions SDOH Screenings   Food Insecurity: Food Insecurity Present (10/01/2024)  Housing: High Risk (10/01/2024)  Tobacco Use: Low Risk (10/16/2024)    Readmission Risk Interventions     No data to display
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
## 2024-11-09 NOTE — Progress Notes (Signed)
 Patient refused blood glucose check and breakfast tray this AM.  Patient educated on nutrition and management of blood glucose levels.  Patient continues to refuse.  MD and dietician notified.

## 2024-11-10 LAB — GLUCOSE, CAPILLARY
Glucose-Capillary: 143 mg/dL — ABNORMAL HIGH (ref 70–99)
Glucose-Capillary: 147 mg/dL — ABNORMAL HIGH (ref 70–99)
Glucose-Capillary: 192 mg/dL — ABNORMAL HIGH (ref 70–99)
Glucose-Capillary: 212 mg/dL — ABNORMAL HIGH (ref 70–99)
Glucose-Capillary: 70 mg/dL (ref 70–99)
Glucose-Capillary: 87 mg/dL (ref 70–99)

## 2024-11-10 MED ADMIN — ORAL CARE MOUTH RINSE: 15 mL | OROMUCOSAL | NDC 99999080097

## 2024-11-10 MED ADMIN — Sennosides Tab 8.6 MG: 8.6 mg | ORAL | NDC 00904725280

## 2024-11-10 MED ADMIN — B-Complex w/ C & Folic Acid Tab 0.8 MG: 1 | ORAL | NDC 60258016001

## 2024-11-10 MED ADMIN — Water For Irrigation, Sterile Irrigation Soln: 50 mL | NDC 99999080061

## 2024-11-10 MED ADMIN — Amlodipine Besylate Tab 10 MG (Base Equivalent): 10 mg | ORAL | NDC 00904637161

## 2024-11-10 MED ADMIN — Insulin Aspart Inj Soln 100 Unit/ML: 7 [IU] | SUBCUTANEOUS | NDC 73070010011

## 2024-11-10 MED ADMIN — Insulin Aspart Inj Soln 100 Unit/ML: 3 [IU] | SUBCUTANEOUS | NDC 73070010011

## 2024-11-10 MED ADMIN — Insulin Aspart Inj Soln 100 Unit/ML: 4 [IU] | SUBCUTANEOUS | NDC 73070010011

## 2024-11-10 MED ADMIN — Oxycodone HCl Tab 5 MG: 5 mg | ORAL | NDC 00406055223

## 2024-11-10 MED ADMIN — Oxycodone HCl Tab 5 MG: 5 mg | ORAL | NDC 68084035411

## 2024-11-10 MED ADMIN — Nutritional Supplement Liquid: 237 mL | ORAL | NDC 4390018629

## 2024-11-10 MED ADMIN — Acetaminophen Tab 500 MG: 1000 mg | ORAL | NDC 50580045711

## 2024-11-10 MED ADMIN — Protein Liquid (Enteral): 60 mL | NDC 9468801446

## 2024-11-10 MED ADMIN — Nutritional Supplement Liquid: 960 mL | NDC 7007462700

## 2024-11-10 MED ADMIN — Hydralazine HCl Tab 50 MG: 50 mg | ORAL | NDC 23155083401

## 2024-11-10 MED ADMIN — Hydralazine HCl Tab 50 MG: 50 mg | ORAL | NDC 31722052101

## 2024-11-10 MED ADMIN — Heparin Sodium (Porcine) Inj 5000 Unit/ML: 5000 [IU] | SUBCUTANEOUS | NDC 72572025501

## 2024-11-10 MED ADMIN — Aspirin Chew Tab 81 MG: 81 mg | ORAL | NDC 00904679480

## 2024-11-10 MED ADMIN — Polyethylene Glycol 3350 Oral Packet 17 GM: 17 g | ORAL | NDC 00904693186

## 2024-11-10 MED ADMIN — Lidocaine Patch 5%: 2 | TRANSDERMAL | NDC 82347050504

## 2024-11-10 MED ADMIN — Ondansetron HCl Tab 4 MG: 4 mg | ORAL | NDC 00904655161

## 2024-11-10 MED ADMIN — Olanzapine Orally Disintegrating Tab 5 MG: 10 mg | NDC 60505327503

## 2024-11-10 MED ADMIN — Haloperidol Lactate Inj 5 MG/ML: 5 mg | INTRAVENOUS | NDC 67457042600

## 2024-11-10 MED FILL — Haloperidol Lactate Inj 5 MG/ML: 5.0000 mg | INTRAMUSCULAR | Qty: 1 | Status: AC
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
## 2024-11-10 NOTE — Progress Notes (Signed)
 OT Cancellation Note  Patient Details Name: Scott Greer MRN: [id number] DOB: January 06, 1956   Cancelled Treatment:    Reason Eval/Treat Not Completed: Fatigue/lethargy limiting ability to participate. Pt sleeping heavily upon arrival. OT will follow up next available time  Jacques Karna Loose 11/10/2024, 2:41 PM
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
## 2024-11-11 LAB — GLUCOSE, CAPILLARY
Glucose-Capillary: 93 mg/dL (ref 70–99)
Glucose-Capillary: 147 mg/dL — ABNORMAL HIGH (ref 70–99)
Glucose-Capillary: 85 mg/dL (ref 70–99)
Glucose-Capillary: 197 mg/dL — ABNORMAL HIGH (ref 70–99)
Glucose-Capillary: 133 mg/dL — ABNORMAL HIGH (ref 70–99)

## 2024-11-11 MED ADMIN — ORAL CARE MOUTH RINSE: 15 mL | OROMUCOSAL | NDC 99999080097

## 2024-11-11 MED ADMIN — Sennosides Tab 8.6 MG: 8.6 mg | ORAL | NDC 00904725280

## 2024-11-11 MED ADMIN — Water For Irrigation, Sterile Irrigation Soln: 50 mL | NDC 99999080061

## 2024-11-11 MED ADMIN — Amlodipine Besylate Tab 10 MG (Base Equivalent): 10 mg | ORAL | NDC 00904637161

## 2024-11-11 MED ADMIN — Insulin Aspart Inj Soln 100 Unit/ML: 4 [IU] | SUBCUTANEOUS | NDC 73070010011

## 2024-11-11 MED ADMIN — Insulin Aspart Inj Soln 100 Unit/ML: 3 [IU] | SUBCUTANEOUS | NDC 73070010011

## 2024-11-11 MED ADMIN — Acetaminophen Tab 500 MG: 1000 mg | ORAL | NDC 50580045711

## 2024-11-11 MED ADMIN — Protein Liquid (Enteral): 60 mL | NDC 9468801446

## 2024-11-11 MED ADMIN — Nutritional Supplement Liquid: 960 mL | NDC 7007462700

## 2024-11-11 MED ADMIN — Hydralazine HCl Tab 50 MG: 50 mg | ORAL | NDC 23155083401

## 2024-11-11 MED ADMIN — Heparin Sodium (Porcine) Inj 5000 Unit/ML: 5000 [IU] | SUBCUTANEOUS | NDC 72572025501

## 2024-11-11 MED ADMIN — Aspirin Chew Tab 81 MG: 81 mg | ORAL | NDC 00904679480

## 2024-11-11 MED ADMIN — Lidocaine Patch 5%: 2 | TRANSDERMAL | NDC 42291047711

## 2024-11-11 MED ADMIN — Haloperidol Lactate Inj 5 MG/ML: 5 mg | INTRAVENOUS | NDC 67457042600

## 2024-11-11 MED ADMIN — Olanzapine Orally Disintegrating Tab 5 MG: 10 mg | NDC 60505327503

## 2024-11-11 MED FILL — Haloperidol Lactate Inj 5 MG/ML: 5.0000 mg | INTRAMUSCULAR | Qty: 1 | Status: AC
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
## 2024-11-12 LAB — GLUCOSE, CAPILLARY
Glucose-Capillary: 201 mg/dL — ABNORMAL HIGH (ref 70–99)
Glucose-Capillary: 155 mg/dL — ABNORMAL HIGH (ref 70–99)
Glucose-Capillary: 184 mg/dL — ABNORMAL HIGH (ref 70–99)
Glucose-Capillary: 128 mg/dL — ABNORMAL HIGH (ref 70–99)

## 2024-11-12 MED ORDER — INSULIN ASPART 100 UNIT/ML IJ SOLN
0.0000 [IU] | Freq: Three times a day (TID) | INTRAMUSCULAR | Status: DC
  Administered 2024-11-12: 4 [IU] via SUBCUTANEOUS
  Administered 2024-11-13 – 2024-11-14 (×3): 7 [IU] via SUBCUTANEOUS
  Administered 2024-11-14: 11 [IU] via SUBCUTANEOUS
  Administered 2024-11-15: 7 [IU] via SUBCUTANEOUS
  Administered 2024-11-15: 4 [IU] via SUBCUTANEOUS
  Administered 2024-11-16: 7 [IU] via SUBCUTANEOUS
  Administered 2024-11-17: 3 [IU] via SUBCUTANEOUS
  Administered 2024-11-17: 11 [IU] via SUBCUTANEOUS
  Administered 2024-11-18: 4 [IU] via SUBCUTANEOUS
  Filled 2024-11-12: qty 7
  Filled 2024-11-12: qty 11
  Filled 2024-11-12: qty 4
  Filled 2024-11-12: qty 7
  Filled 2024-11-12: qty 4
  Filled 2024-11-12: qty 3
  Filled 2024-11-12: qty 4
  Filled 2024-11-12: qty 11
  Filled 2024-11-12: qty 5
  Filled 2024-11-12 (×2): qty 4
  Filled 2024-11-12: qty 3

## 2024-11-12 MED ADMIN — ORAL CARE MOUTH RINSE: 15 mL | OROMUCOSAL | NDC 99999080097

## 2024-11-12 MED ADMIN — Sennosides Tab 8.6 MG: 8.6 mg | ORAL | NDC 00904725280

## 2024-11-12 MED ADMIN — B-Complex w/ C & Folic Acid Tab 0.8 MG: 1 | ORAL | NDC 60258016001

## 2024-11-12 MED ADMIN — Water For Irrigation, Sterile Irrigation Soln: 50 mL | NDC 99999080061

## 2024-11-12 MED ADMIN — Amlodipine Besylate Tab 10 MG (Base Equivalent): 10 mg | ORAL | NDC 00904637161

## 2024-11-12 MED ADMIN — Insulin Aspart Inj Soln 100 Unit/ML: 4 [IU] | SUBCUTANEOUS | NDC 73070010011

## 2024-11-12 MED ADMIN — Insulin Aspart Inj Soln 100 Unit/ML: 7 [IU] | SUBCUTANEOUS | NDC 73070010011

## 2024-11-12 MED ADMIN — Oxycodone HCl Tab 5 MG: 5 mg | ORAL | NDC 68084035411

## 2024-11-12 MED ADMIN — Protein Liquid (Enteral): 60 mL | NDC 9468801446

## 2024-11-12 MED ADMIN — Nutritional Supplement Liquid: 960 mL | NDC 7007462700

## 2024-11-12 MED ADMIN — Hydralazine HCl Tab 50 MG: 50 mg | ORAL | NDC 23155083401

## 2024-11-12 MED ADMIN — Aspirin Chew Tab 81 MG: 81 mg | ORAL | NDC 00904679480

## 2024-11-12 MED ADMIN — Acetaminophen Tab 500 MG: 1000 mg | ORAL | NDC 50580045711

## 2024-11-12 MED ADMIN — Polyethylene Glycol 3350 Oral Packet 17 GM: 17 g | ORAL | NDC 00904693186

## 2024-11-12 MED ADMIN — Lidocaine Patch 5%: 2 | TRANSDERMAL | NDC 42291047711

## 2024-11-12 MED ADMIN — Rivaroxaban Tab 10 MG: 10 mg | ORAL | NDC 50458058001

## 2024-11-12 MED ADMIN — Quetiapine Fumarate Tab 50 MG: 50 mg | ORAL | NDC 67877024901

## 2024-11-12 MED FILL — Rivaroxaban Tab 10 MG: 10.0000 mg | ORAL | Qty: 1 | Status: AC

## 2024-11-12 MED FILL — Acetaminophen Tab 500 MG: 1000.0000 mg | ORAL | Qty: 2 | Status: AC

## 2024-11-12 MED FILL — Quetiapine Fumarate Tab 50 MG: 50.0000 mg | ORAL | Qty: 1 | Status: AC

## 2024-11-12 NOTE — Plan of Care (Signed)
   Problem: Clinical Measurements: Goal: Ability to maintain clinical measurements within normal limits will improve Outcome: Progressing Goal: Will remain free from infection Outcome: Progressing

## 2024-11-12 NOTE — TOC Progression Note (Signed)
 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 Patient with increased agitation requiring haldol  which is another barrier to placement along with previously mentioned barriers.      Barriers to Discharge: Continued Medical Work up               Expected Discharge Plan and Services       Living arrangements for the past 2 months: Homeless Expected Discharge Date: 10/26/24                                     Social Drivers of Health (SDOH) Interventions SDOH Screenings   Food Insecurity: Food Insecurity Present (10/01/2024)  Housing: High Risk (10/01/2024)  Tobacco Use: Low Risk (10/16/2024)    Readmission Risk Interventions     No data to display
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
## 2024-11-12 NOTE — Progress Notes (Signed)
 Mr. Timbrook with increased agitation and aggression.  Refused Arabesp injection at this time.  Patient also refused to start tube feedings at this time. States he wants to eat dinner and feels very full.  Medication re-timed to 2000.

## 2024-11-12 NOTE — Progress Notes (Signed)
 Notified Trauma PA of refusal of medications.  Refused to start nocturnal tube feeds at this time.  Refused aranesp  injection at this time.     11/12/24 1800  Provider Notification  Provider Name/Title Almarie Pringle PAC/ Trauma PA  Date Provider Notified 11/12/24  Time Provider Notified 1800  Method of Notification Virtual Face-to-face (Secure Chat)  Notification Reason Red med refusal;Other (Comment) (Refusal of tube feeds)  Provider response Evaluate remotely;No new orders  Date of Provider Response 11/12/24  Time of Provider Response [phone]
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
## 2024-11-12 NOTE — Progress Notes (Signed)
 Patient refusing CBGs and Heparin  overnight. Yelling, cussing and swinging hand when attempting to give and explain meds ordered.
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
## 2024-11-13 ENCOUNTER — Other Ambulatory Visit (HOSPITAL_COMMUNITY): Payer: Self-pay

## 2024-11-13 ENCOUNTER — Telehealth (HOSPITAL_COMMUNITY): Payer: Self-pay

## 2024-11-13 LAB — GLUCOSE, CAPILLARY
Glucose-Capillary: 201 mg/dL — ABNORMAL HIGH (ref 70–99)
Glucose-Capillary: 206 mg/dL — ABNORMAL HIGH (ref 70–99)
Glucose-Capillary: 78 mg/dL (ref 70–99)
Glucose-Capillary: 241 mg/dL — ABNORMAL HIGH (ref 70–99)
Glucose-Capillary: 97 mg/dL (ref 70–99)

## 2024-11-13 MED ORDER — QUETIAPINE 12.5 MG HALF TABLET
25.0000 mg | ORAL_TABLET | Freq: Every day | ORAL | Status: DC
  Administered 2024-11-13 – 2024-11-15 (×3): 25 mg via ORAL
  Filled 2024-11-13 (×4): qty 2

## 2024-11-13 MED ADMIN — ORAL CARE MOUTH RINSE: 15 mL | OROMUCOSAL | NDC 99999080097

## 2024-11-13 MED ADMIN — Sennosides Tab 8.6 MG: 8.6 mg | ORAL | NDC 00904725280

## 2024-11-13 MED ADMIN — B-Complex w/ C & Folic Acid Tab 0.8 MG: 1 | ORAL | NDC 60258016001

## 2024-11-13 MED ADMIN — Water For Irrigation, Sterile Irrigation Soln: 50 mL | NDC 99999080061

## 2024-11-13 MED ADMIN — Amlodipine Besylate Tab 10 MG (Base Equivalent): 10 mg | ORAL | NDC 00904637161

## 2024-11-13 MED ADMIN — Oxycodone HCl Tab 5 MG: 5 mg | ORAL | NDC 68084035411

## 2024-11-13 MED ADMIN — Nutritional Supplement Liquid: 237 mL | ORAL | NDC 4390018629

## 2024-11-13 MED ADMIN — Protein Liquid (Enteral): 60 mL | NDC 9468801446

## 2024-11-13 MED ADMIN — Nutritional Supplement Liquid: 960 mL | NDC 7007462700

## 2024-11-13 MED ADMIN — Hydralazine HCl Tab 50 MG: 50 mg | ORAL | NDC 23155083401

## 2024-11-13 MED ADMIN — Aspirin Chew Tab 81 MG: 81 mg | ORAL | NDC 00904679480

## 2024-11-13 MED ADMIN — Lorazepam Tab 1 MG: 1 mg | NDC 13107008405

## 2024-11-13 MED ADMIN — Acetaminophen Tab 500 MG: 1000 mg | ORAL | NDC 50580045711

## 2024-11-13 MED ADMIN — Polyethylene Glycol 3350 Oral Packet 17 GM: 17 g | ORAL | NDC 00904693186

## 2024-11-13 MED ADMIN — Rivaroxaban Tab 10 MG: 10 mg | ORAL | NDC 50458058001

## 2024-11-13 MED ADMIN — Darbepoetin Alfa Soln Prefilled Syringe 40 MCG/0.4ML: 40 ug | SUBCUTANEOUS | NDC 55513002101

## 2024-11-13 MED FILL — Lorazepam Tab 1 MG: 1.0000 mg | ORAL | Qty: 1 | Status: AC

## 2024-11-13 NOTE — Progress Notes (Signed)
 Orthopedic Tech Progress Note Patient Details:  Scott Greer 1956-08-15 [id number]  Ortho Devices Type of Ortho Device: Other (comment) Ortho Device/Splint Location: Resting hand splint (L) Left in room per order. Ortho Device/Splint Interventions: Ordered   Post Interventions Patient Tolerated: Well  Adine MARLA Blush 11/13/2024, 10:49 AM

## 2024-11-13 NOTE — Telephone Encounter (Signed)
 Pharmacy Patient Advocate Encounter  Insurance verification completed.    The patient is insured through [employer]. Patient has Medicare and is not eligible for a copay card, but may be able to apply for patient assistance or Medicare RX Payment Plan (Patient Must reach out to their plan, if eligible for payment plan), if available.    Ran test claim for Xarelto  20mg  tablet and the current 30 day co-pay is $4.90.   This test claim was processed through [facility]- copay amounts may vary at other pharmacies due to pharmacy/plan contracts, or as the patient moves through the different stages of their insurance plan.
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
## 2024-11-14 LAB — GLUCOSE, CAPILLARY
Glucose-Capillary: 101 mg/dL — ABNORMAL HIGH (ref 70–99)
Glucose-Capillary: 158 mg/dL — ABNORMAL HIGH (ref 70–99)
Glucose-Capillary: 239 mg/dL — ABNORMAL HIGH (ref 70–99)
Glucose-Capillary: 240 mg/dL — ABNORMAL HIGH (ref 70–99)
Glucose-Capillary: 251 mg/dL — ABNORMAL HIGH (ref 70–99)
Glucose-Capillary: 56 mg/dL — ABNORMAL LOW (ref 70–99)
Glucose-Capillary: 79 mg/dL (ref 70–99)

## 2024-11-14 MED ADMIN — ORAL CARE MOUTH RINSE: 15 mL | OROMUCOSAL | NDC 99999080097

## 2024-11-14 MED ADMIN — Sennosides Tab 8.6 MG: 8.6 mg | ORAL | NDC 00904725280

## 2024-11-14 MED ADMIN — B-Complex w/ C & Folic Acid Tab 0.8 MG: 1 | ORAL | NDC 60258016001

## 2024-11-14 MED ADMIN — Water For Irrigation, Sterile Irrigation Soln: 50 mL | NDC 99999080061

## 2024-11-14 MED ADMIN — Amlodipine Besylate Tab 10 MG (Base Equivalent): 10 mg | ORAL | NDC 00904637161

## 2024-11-14 MED ADMIN — Nutritional Supplement Liquid: 237 mL | ORAL | NDC 4390018629

## 2024-11-14 MED ADMIN — Protein Liquid (Enteral): 60 mL | NDC 9468801446

## 2024-11-14 MED ADMIN — Nutritional Supplement Liquid: 960 mL | NDC 7007462700

## 2024-11-14 MED ADMIN — Hydralazine HCl Tab 50 MG: 50 mg | ORAL | NDC 23155083401

## 2024-11-14 MED ADMIN — Aspirin Chew Tab 81 MG: 81 mg | ORAL | NDC 00904679480

## 2024-11-14 MED ADMIN — Lorazepam Tab 1 MG: 1 mg | NDC 13107008405

## 2024-11-14 MED ADMIN — Polyethylene Glycol 3350 Oral Packet 17 GM: 17 g | ORAL | NDC 00904693186

## 2024-11-14 MED ADMIN — Rivaroxaban Tab 10 MG: 10 mg | ORAL | NDC 50458058001
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
## 2024-11-14 NOTE — Plan of Care (Signed)
 ?  Problem: Clinical Measurements: ?Goal: Will remain free from infection ?Outcome: Progressing ?  ?

## 2024-11-15 LAB — GLUCOSE, CAPILLARY
Glucose-Capillary: 156 mg/dL — ABNORMAL HIGH (ref 70–99)
Glucose-Capillary: 163 mg/dL — ABNORMAL HIGH (ref 70–99)
Glucose-Capillary: 191 mg/dL — ABNORMAL HIGH (ref 70–99)
Glucose-Capillary: 199 mg/dL — ABNORMAL HIGH (ref 70–99)
Glucose-Capillary: 203 mg/dL — ABNORMAL HIGH (ref 70–99)
Glucose-Capillary: 226 mg/dL — ABNORMAL HIGH (ref 70–99)

## 2024-11-15 MED ADMIN — ORAL CARE MOUTH RINSE: 15 mL | OROMUCOSAL | NDC 99999080097

## 2024-11-15 MED ADMIN — Sennosides Tab 8.6 MG: 8.6 mg | ORAL | NDC 00904725280

## 2024-11-15 MED ADMIN — B-Complex w/ C & Folic Acid Tab 0.8 MG: 1 | ORAL | NDC 60258016001

## 2024-11-15 MED ADMIN — Water For Irrigation, Sterile Irrigation Soln: 50 mL | NDC 99999080061

## 2024-11-15 MED ADMIN — Amlodipine Besylate Tab 10 MG (Base Equivalent): 10 mg | ORAL | NDC 00904637161

## 2024-11-15 MED ADMIN — Oxycodone HCl Tab 5 MG: 5 mg | ORAL | NDC 68084035411

## 2024-11-15 MED ADMIN — Nutritional Supplement Liquid: 237 mL | ORAL | NDC 7007464928

## 2024-11-15 MED ADMIN — Protein Liquid (Enteral): 60 mL | NDC 9468801446

## 2024-11-15 MED ADMIN — Nutritional Supplement Liquid: 960 mL | NDC 7007462700

## 2024-11-15 MED ADMIN — Hydralazine HCl Tab 50 MG: 50 mg | ORAL | NDC 23155083401

## 2024-11-15 MED ADMIN — Aspirin Chew Tab 81 MG: 81 mg | ORAL | NDC 00904679480

## 2024-11-15 MED ADMIN — Lorazepam Tab 1 MG: 1 mg | NDC 13107008405

## 2024-11-15 MED ADMIN — Acetaminophen Tab 500 MG: 1000 mg | ORAL | NDC 50580045711

## 2024-11-15 MED ADMIN — Polyethylene Glycol 3350 Oral Packet 17 GM: 17 g | ORAL | NDC 00904693186

## 2024-11-15 MED ADMIN — Lidocaine Patch 5%: 2 | TRANSDERMAL | NDC 82347050504

## 2024-11-15 MED ADMIN — Rivaroxaban Tab 10 MG: 10 mg | ORAL | NDC 50458058001
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
## 2024-11-15 NOTE — Plan of Care (Signed)
" °  Problem: Safety: Goal: Ability to remain free from injury will improve Outcome: Progressing   Problem: Pain Management: Goal: Pain level will decrease Outcome: Progressing   Problem: Nutritional: Goal: Maintenance of adequate nutrition will improve Outcome: Progressing   "

## 2024-11-15 NOTE — Progress Notes (Signed)
 Osmolite 1.5 feeding connected and infusing per tube at 80 mL/hr.

## 2024-11-15 NOTE — Plan of Care (Signed)
 " Problem: Clinical Measurements: Goal: Ability to maintain clinical measurements within normal limits will improve Outcome: Not Progressing Goal: Will remain free from infection Outcome: Not Progressing Goal: Diagnostic test results will improve Outcome: Not Progressing Goal: Respiratory complications will improve Outcome: Not Progressing Goal: Cardiovascular complication will be avoided Outcome: Not Progressing   Problem: Safety: Goal: Ability to remain free from injury will improve Outcome: Not Progressing   Problem: Skin Integrity: Goal: Risk for impaired skin integrity will decrease Outcome: Not Progressing   Problem: Education: Goal: Ability to describe self-care measures that may prevent or decrease complications (Diabetes Survival Skills Education) will improve Outcome: Not Progressing Goal: Individualized Educational Video(s) Outcome: Not Progressing   Problem: Coping: Goal: Ability to adjust to condition or change in health will improve Outcome: Not Progressing   Problem: Fluid Volume: Goal: Ability to maintain a balanced intake and output will improve Outcome: Not Progressing   Problem: Health Behavior/Discharge Planning: Goal: Ability to identify and utilize available resources and services will improve Outcome: Not Progressing Goal: Ability to manage health-related needs will improve Outcome: Not Progressing   Problem: Metabolic: Goal: Ability to maintain appropriate glucose levels will improve Outcome: Not Progressing   Problem: Nutritional: Goal: Maintenance of adequate nutrition will improve Outcome: Not Progressing Goal: Progress toward achieving an optimal weight will improve Outcome: Not Progressing   Problem: Skin Integrity: Goal: Risk for impaired skin integrity will decrease Outcome: Not Progressing   Problem: Tissue Perfusion: Goal: Adequacy of tissue perfusion will improve Outcome: Not Progressing   Problem: Education: Goal:  Verbalization of understanding the information provided (i.e., activity precautions, restrictions, etc) will improve Outcome: Not Progressing Goal: Individualized Educational Video(s) Outcome: Not Progressing   Problem: Activity: Goal: Ability to ambulate and perform ADLs will improve Outcome: Not Progressing   Problem: Clinical Measurements: Goal: Postoperative complications will be avoided or minimized Outcome: Not Progressing   Problem: Self-Concept: Goal: Ability to maintain and perform role responsibilities to the fullest extent possible will improve Outcome: Not Progressing   Problem: Pain Management: Goal: Pain level will decrease Outcome: Not Progressing   Problem: Education: Goal: Verbalization of understanding the information provided (i.e., activity precautions, restrictions, etc) will improve Outcome: Not Progressing Goal: Individualized Educational Video(s) Outcome: Not Progressing   Problem: Activity: Goal: Ability to ambulate and perform ADLs will improve Outcome: Not Progressing   Problem: Clinical Measurements: Goal: Postoperative complications will be avoided or minimized Outcome: Not Progressing   Problem: Self-Concept: Goal: Ability to maintain and perform role responsibilities to the fullest extent possible will improve Outcome: Not Progressing   Problem: Pain Management: Goal: Pain level will decrease Outcome: Not Progressing   Problem: Education: Goal: Ability to describe self-care measures that may prevent or decrease complications (Diabetes Survival Skills Education) will improve Outcome: Not Progressing Goal: Individualized Educational Video(s) Outcome: Not Progressing   Problem: Coping: Goal: Ability to adjust to condition or change in health will improve Outcome: Not Progressing   Problem: Fluid Volume: Goal: Ability to maintain a balanced intake and output will improve Outcome: Not Progressing   Problem: Health Behavior/Discharge  Planning: Goal: Ability to identify and utilize available resources and services will improve Outcome: Not Progressing Goal: Ability to manage health-related needs will improve Outcome: Not Progressing   Problem: Metabolic: Goal: Ability to maintain appropriate glucose levels will improve Outcome: Not Progressing   Problem: Nutritional: Goal: Maintenance of adequate nutrition will improve Outcome: Not Progressing Goal: Progress toward achieving an optimal weight will improve Outcome: Not Progressing   Problem: Skin  Integrity: Goal: Risk for impaired skin integrity will decrease Outcome: Not Progressing   Problem: Tissue Perfusion: Goal: Adequacy of tissue perfusion will improve Outcome: Not Progressing   "

## 2024-11-16 LAB — GLUCOSE, CAPILLARY
Glucose-Capillary: 230 mg/dL — ABNORMAL HIGH (ref 70–99)
Glucose-Capillary: 156 mg/dL — ABNORMAL HIGH (ref 70–99)
Glucose-Capillary: 79 mg/dL (ref 70–99)
Glucose-Capillary: 193 mg/dL — ABNORMAL HIGH (ref 70–99)

## 2024-11-16 MED ORDER — HALOPERIDOL LACTATE 5 MG/ML IJ SOLN
2.0000 mg | Freq: Four times a day (QID) | INTRAMUSCULAR | Status: DC | PRN
  Administered 2024-11-16: 2 mg via INTRAVENOUS
  Filled 2024-11-16: qty 1

## 2024-11-16 MED ADMIN — ORAL CARE MOUTH RINSE: 15 mL | OROMUCOSAL | NDC 99999080097

## 2024-11-16 MED ADMIN — Sennosides Tab 8.6 MG: 8.6 mg | ORAL | NDC 00904725280

## 2024-11-16 MED ADMIN — B-Complex w/ C & Folic Acid Tab 0.8 MG: 1 | ORAL | NDC 60258016001

## 2024-11-16 MED ADMIN — Water For Irrigation, Sterile Irrigation Soln: 50 mL | NDC 99999080061

## 2024-11-16 MED ADMIN — Quetiapine Fumarate Tab 25 MG: 25 mg | ORAL | NDC 99999010012

## 2024-11-16 MED ADMIN — Amlodipine Besylate Tab 10 MG (Base Equivalent): 10 mg | ORAL | NDC 00904637161

## 2024-11-16 MED ADMIN — Oxycodone HCl Tab 5 MG: 5 mg | ORAL | NDC 68084035411

## 2024-11-16 MED ADMIN — Nutritional Supplement Liquid: 237 mL | ORAL | NDC 4390018629

## 2024-11-16 MED ADMIN — Protein Liquid (Enteral): 60 mL | NDC 9468801446

## 2024-11-16 MED ADMIN — Nutritional Supplement Liquid: 960 mL | NDC 7007457472

## 2024-11-16 MED ADMIN — Hydralazine HCl Tab 50 MG: 50 mg | ORAL | NDC 23155083401

## 2024-11-16 MED ADMIN — Aspirin Chew Tab 81 MG: 81 mg | ORAL | NDC 00904679480

## 2024-11-16 MED ADMIN — Lorazepam Tab 1 MG: 1 mg | NDC 13107008405

## 2024-11-16 MED ADMIN — Polyethylene Glycol 3350 Oral Packet 17 GM: 17 g | ORAL | NDC 00904693186

## 2024-11-16 MED ADMIN — Ondansetron HCl Tab 4 MG: 4 mg | ORAL | NDC 00904655161

## 2024-11-16 MED ADMIN — Rivaroxaban Tab 10 MG: 10 mg | ORAL | NDC 50458058010

## 2024-11-16 MED FILL — Quetiapine Fumarate Tab 25 MG: 25.0000 mg | ORAL | Qty: 2 | Status: AC
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
## 2024-11-16 NOTE — Plan of Care (Signed)
  Problem: Safety: Goal: Ability to remain free from injury will improve Outcome: Progressing   Problem: Nutritional: Goal: Maintenance of adequate nutrition will improve Outcome: Progressing

## 2024-11-17 LAB — GLUCOSE, CAPILLARY
Glucose-Capillary: 259 mg/dL — ABNORMAL HIGH (ref 70–99)
Glucose-Capillary: 54 mg/dL — ABNORMAL LOW (ref 70–99)
Glucose-Capillary: 90 mg/dL (ref 70–99)
Glucose-Capillary: 141 mg/dL — ABNORMAL HIGH (ref 70–99)
Glucose-Capillary: 114 mg/dL — ABNORMAL HIGH (ref 70–99)

## 2024-11-17 MED ORDER — JUVEN PO PACK
1.0000 | PACK | Freq: Two times a day (BID) | ORAL | Status: DC
  Administered 2024-11-17 – 2024-11-19 (×5): 1
  Filled 2024-11-17 (×6): qty 1

## 2024-11-17 MED ADMIN — ORAL CARE MOUTH RINSE: 15 mL | OROMUCOSAL | NDC 99999080097

## 2024-11-17 MED ADMIN — Sennosides Tab 8.6 MG: 8.6 mg | ORAL | NDC 00904643480

## 2024-11-17 MED ADMIN — B-Complex w/ C & Folic Acid Tab 0.8 MG: 1 | ORAL | NDC 60258016001

## 2024-11-17 MED ADMIN — Water For Irrigation, Sterile Irrigation Soln: 50 mL | NDC 99999080061

## 2024-11-17 MED ADMIN — Quetiapine Fumarate Tab 25 MG: 25 mg | ORAL | NDC 99999010012

## 2024-11-17 MED ADMIN — Amlodipine Besylate Tab 10 MG (Base Equivalent): 10 mg | ORAL | NDC 00904637161

## 2024-11-17 MED ADMIN — Oxycodone HCl Tab 5 MG: 5 mg | ORAL | NDC 68084035411

## 2024-11-17 MED ADMIN — Nutritional Supplement Liquid: 237 mL | ORAL | NDC 4390018629

## 2024-11-17 MED ADMIN — Protein Liquid (Enteral): 60 mL | NDC 9468801446

## 2024-11-17 MED ADMIN — Hydralazine HCl Tab 50 MG: 50 mg | ORAL | NDC 23155083401

## 2024-11-17 MED ADMIN — Aspirin Chew Tab 81 MG: 81 mg | ORAL | NDC 00904679480

## 2024-11-17 MED ADMIN — Lorazepam Tab 1 MG: 1 mg | NDC 13107008405

## 2024-11-17 MED ADMIN — Polyethylene Glycol 3350 Oral Packet 17 GM: 17 g | ORAL | NDC 00904693186

## 2024-11-17 MED ADMIN — Rivaroxaban Tab 10 MG: 10 mg | ORAL | NDC 50458058001

## 2024-11-17 MED ADMIN — Nutritional Supplement Liquid: 1000 mL | NDC 7007462700

## 2024-11-17 MED FILL — Nutritional Supplement Liquid: 1000.0000 mL | ORAL | Qty: 1000 | Status: AC

## 2024-11-17 NOTE — TOC Progression Note (Signed)
 Transition of Care [facility]) - Progression Note    Patient Details  Name: Scott Greer MRN: [id number] Date of Birth: 1956/04/08  [facility] CM/SW Contact  Belinda Schlichting E Tricia [place], LCSW Phone Number: 11/17/2024, 3:55 PM  Clinical Narrative:    PA has made changes to patient's Seroquel  to attempt to help with agitation.  Spoke with DSS Worker [name] - she states to try [facility], that they can sometimes provide a higher amount of care to wheelchair bound patients. CSW attempted call to [facility] - left message with Receptionist requesting a return call from April in Admissions when she is back in the office tomorrow.      Barriers to Discharge: Continued Medical Work up               Expected Discharge Plan and Services       Living arrangements for the past 2 months: Homeless Expected Discharge Date: 10/26/24                                     Social Drivers of Health (SDOH) Interventions SDOH Screenings   Food Insecurity: Food Insecurity Present (10/01/2024)  Housing: High Risk (10/01/2024)  Tobacco Use: Low Risk (10/16/2024)    Readmission Risk Interventions     No data to display

## 2024-11-17 NOTE — Inpatient Diabetes Management (Signed)
 Inpatient Diabetes Program Recommendations  AACE/ADA: New Consensus Statement on Inpatient Glycemic Control   Target Ranges:  Prepandial:   less than 140 mg/dL      Peak postprandial:   less than 180 mg/dL (1-2 hours)      Critically ill patients:  140 - 180 mg/dL   Lab Results  Component Value Date   GLUCAP 54 (L) 11/17/2024   HGBA1C 6.2 (H) 10/02/2024    Latest Reference Range & Units 11/16/24 12:05 11/16/24 16:12 11/16/24 22:31 11/17/24 08:10 11/17/24 12:13  Glucose-Capillary 70 - 99 mg/dL 843 (H) 79 806 (H) 740 (H) 54 (L)   Review of Glycemic Control  Diabetes history: DM2  Outpatient Diabetes medications: None  Current orders for Inpatient glycemic control:  Novolog  0-20 units TID   Inpatient Diabetes Program Recommendations:   Noted  - Osmolite Tube Feeds from 6pm to 6am -  Hypoglycemia   Pleas consider:  - Change correction to Novolog  0-9 units Q4HRS    Thanks,  Lavanda Search, RN, MSN, [facility]  Inpatient Diabetes Coordinator  Pager [phone] (8a-5p)
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
## 2024-11-17 NOTE — Progress Notes (Signed)
 Hypoglycemic Event  CBG: 54  Treatment: 8 oz juice/soda  Symptoms: None  Follow-up CBG: Time:1355 CBG Result:90  Possible Reasons for Event: Inadequate meal intake      Scott Greer A

## 2024-11-17 NOTE — TOC Progression Note (Addendum)
 Transition of Care [facility]) - Progression Note    Patient Details  Name: Scott Greer MRN: [id number] Date of Birth: 1956/05/29  [facility] CM/SW Contact  Seana Underwood E Agron Swiney, LCSW Phone Number: 11/17/2024, 11:51 AM  Clinical Narrative:    Spoke with DSS Worker [name]. Patient remains under DSS Protective Order. [name] to staff the case with her Supervisor and will provide updates to CSW when available. Patient remains difficult to place.  Updated TOC Supervisor.    Barriers to Discharge: Continued Medical Work up               Expected Discharge Plan and Services       Living arrangements for the past 2 months: Homeless Expected Discharge Date: 10/26/24                                     Social Drivers of Health (SDOH) Interventions SDOH Screenings   Food Insecurity: Food Insecurity Present (10/01/2024)  Housing: High Risk (10/01/2024)  Tobacco Use: Low Risk (10/16/2024)    Readmission Risk Interventions     No data to display
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
## 2024-11-17 NOTE — Plan of Care (Signed)
" °  Problem: Nutritional: Goal: Maintenance of adequate nutrition will improve Outcome: Progressing   Problem: Fluid Volume: Goal: Ability to maintain a balanced intake and output will improve Outcome: Progressing   "

## 2024-11-18 LAB — GLUCOSE, CAPILLARY
Glucose-Capillary: 188 mg/dL — ABNORMAL HIGH (ref 70–99)
Glucose-Capillary: 131 mg/dL — ABNORMAL HIGH (ref 70–99)
Glucose-Capillary: 155 mg/dL — ABNORMAL HIGH (ref 70–99)
Glucose-Capillary: 173 mg/dL — ABNORMAL HIGH (ref 70–99)

## 2024-11-18 MED ADMIN — ORAL CARE MOUTH RINSE: 15 mL | OROMUCOSAL | NDC 99999080097

## 2024-11-18 MED ADMIN — Sennosides Tab 8.6 MG: 8.6 mg | ORAL | NDC 00904725280

## 2024-11-18 MED ADMIN — B-Complex w/ C & Folic Acid Tab 0.8 MG: 1 | ORAL | NDC 60258016001

## 2024-11-18 MED ADMIN — Water For Irrigation, Sterile Irrigation Soln: 50 mL | NDC 99999080061

## 2024-11-18 MED ADMIN — Quetiapine Fumarate Tab 25 MG: 25 mg | ORAL | NDC 99999010012

## 2024-11-18 MED ADMIN — Amlodipine Besylate Tab 10 MG (Base Equivalent): 10 mg | ORAL | NDC 00904637161

## 2024-11-18 MED ADMIN — Oxycodone HCl Tab 5 MG: 5 mg | ORAL | NDC 68084035411

## 2024-11-18 MED ADMIN — Nutritional Supplement Liquid: 237 mL | ORAL | NDC 4390018629

## 2024-11-18 MED ADMIN — Insulin Aspart Inj Soln 100 Unit/ML: 1 [IU] | SUBCUTANEOUS | NDC 73070010011

## 2024-11-18 MED ADMIN — Insulin Aspart Inj Soln 100 Unit/ML: 2 [IU] | SUBCUTANEOUS | NDC 73070010011

## 2024-11-18 MED ADMIN — Protein Liquid (Enteral): 60 mL | NDC 9468801446

## 2024-11-18 MED ADMIN — Hydralazine HCl Tab 50 MG: 50 mg | ORAL | NDC 23155083401

## 2024-11-18 MED ADMIN — Aspirin Chew Tab 81 MG: 81 mg | ORAL | NDC 00904679480

## 2024-11-18 MED ADMIN — Lorazepam Tab 1 MG: 1 mg | NDC 13107008405

## 2024-11-18 MED ADMIN — Polyethylene Glycol 3350 Oral Packet 17 GM: 17 g | ORAL | NDC 00904693186

## 2024-11-18 MED ADMIN — Lidocaine Patch 5%: 2 | TRANSDERMAL | NDC 00603188010

## 2024-11-18 MED ADMIN — Rivaroxaban Tab 10 MG: 10 mg | ORAL | NDC 50458058001

## 2024-11-18 MED ADMIN — Nutritional Supplement Liquid: 1000 mL | NDC 7007457472

## 2024-11-18 MED FILL — Insulin Aspart Inj Soln 100 Unit/ML: 0.0000 [IU] | INTRAMUSCULAR | Qty: 2 | Status: AC

## 2024-11-18 MED FILL — Darbepoetin Alfa Soln Prefilled Syringe 40 MCG/0.4ML: 40.0000 ug | INTRAMUSCULAR | Qty: 0.4 | Status: AC

## 2024-11-18 MED FILL — Insulin Aspart Inj Soln 100 Unit/ML: 0.0000 [IU] | INTRAMUSCULAR | Qty: 1 | Status: AC

## 2024-11-18 MED FILL — Insulin Aspart Inj Soln 100 Unit/ML: 0.0000 [IU] | INTRAMUSCULAR | Qty: 3 | Status: AC

## 2024-11-18 NOTE — Inpatient Diabetes Management (Signed)
 Inpatient Diabetes Program Recommendations  AACE/ADA: New Consensus Statement on Inpatient Glycemic Control   Target Ranges:  Prepandial:   less than 140 mg/dL      Peak postprandial:   less than 180 mg/dL (1-2 hours)      Critically ill patients:  140 - 180 mg/dL    Latest Reference Range & Units 11/17/24 08:10 11/17/24 12:13 11/17/24 13:54 11/17/24 17:48 11/17/24 20:08 11/18/24 07:50  Glucose-Capillary 70 - 99 mg/dL 740 (H)  Novolog  11 unit @9 :23 54 (L) 90 141 (H)  Novolog  3 units 114 (H) 188 (H)  Novolog  4 units   Review of Glycemic Control  Diabetes history: DM2 Outpatient Diabetes medications: None Current orders for Inpatient glycemic control: Novolog  0-20 units TID with meals; Osmolite @ 83 ml/hr from 6p-6a  Inpatient Diabetes Program Recommendations:    Insulin : CBG down to 54 mg/dl at 87:86 on 8/86 after getting Novolog  correction at 9:23 am. Patient receiving nocturnal tube feedings.  Please consider changing CBGs to Q4H and decreasing Novolog  correction to 0-9 units Q4H.  Thanks, Earnie Gainer, RN, MSN, [facility] Diabetes Coordinator Inpatient Diabetes Program [phone] (Team Pager from 8am to 5pm)
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
## 2024-11-18 NOTE — TOC Progression Note (Signed)
 Transition of Care [facility]) - Progression Note    Patient Details  Name: Exzavier Ruderman MRN: [id number] Date of Birth: July 20, 1956  Transition of Care [facility]) CM/SW Contact  Carmelita FORBES Carbon, LCSW Phone Number: 11/18/2024, 1:38 PM  Clinical Narrative:    CSW called [facility] again [phone], spoke with Aleah. She states they cannot accommodate patients that require hoyer lifts. She states she will ask about slide boards in case patient is able to progress to being able to tolerate a slide board transfer. She states she will also ask about patient being on the registry and if this is a barrier.     Barriers to Discharge: Continued Medical Work up               Expected Discharge Plan and Services       Living arrangements for the past 2 months: Homeless Expected Discharge Date: 10/26/24                                     Social Drivers of Health (SDOH) Interventions SDOH Screenings   Food Insecurity: Food Insecurity Present (10/01/2024)  Housing: High Risk (10/01/2024)  Tobacco Use: Low Risk (10/16/2024)    Readmission Risk Interventions     No data to display
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
## 2024-11-18 NOTE — Discharge Instructions (Signed)

## 2024-11-18 NOTE — Plan of Care (Signed)
  Problem: Clinical Measurements: Goal: Ability to maintain clinical measurements within normal limits will improve Outcome: Progressing   Problem: Safety: Goal: Ability to remain free from injury will improve Outcome: Progressing   Problem: Skin Integrity: Goal: Risk for impaired skin integrity will decrease Outcome: Progressing   Problem: Coping: Goal: Ability to adjust to condition or change in health will improve Outcome: Progressing
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
## 2024-11-19 LAB — GLUCOSE, CAPILLARY
Glucose-Capillary: 185 mg/dL — ABNORMAL HIGH (ref 70–99)
Glucose-Capillary: 206 mg/dL — ABNORMAL HIGH (ref 70–99)

## 2024-11-19 MED ADMIN — ORAL CARE MOUTH RINSE: 15 mL | OROMUCOSAL | NDC 99999080097

## 2024-11-19 MED ADMIN — Sennosides Tab 8.6 MG: 8.6 mg | ORAL | NDC 00904725261

## 2024-11-19 MED ADMIN — Sennosides Tab 8.6 MG: 8.6 mg | ORAL | NDC 00904725280

## 2024-11-19 MED ADMIN — B-Complex w/ C & Folic Acid Tab 0.8 MG: 1 | ORAL | NDC 60258016001

## 2024-11-19 MED ADMIN — Water For Irrigation, Sterile Irrigation Soln: 50 mL | NDC 99999080061

## 2024-11-19 MED ADMIN — Quetiapine Fumarate Tab 25 MG: 25 mg | ORAL | NDC 99999010012

## 2024-11-19 MED ADMIN — Amlodipine Besylate Tab 10 MG (Base Equivalent): 10 mg | ORAL | NDC 00904637161

## 2024-11-19 MED ADMIN — Oxycodone HCl Tab 5 MG: 5 mg | ORAL | NDC 68084035411

## 2024-11-19 MED ADMIN — Nutritional Supplement Liquid: 237 mL | ORAL | NDC 4390018629

## 2024-11-19 MED ADMIN — Insulin Aspart Inj Soln 100 Unit/ML: 2 [IU] | SUBCUTANEOUS | NDC 73070010011

## 2024-11-19 MED ADMIN — Protein Liquid (Enteral): 60 mL | NDC 9468801446

## 2024-11-19 MED ADMIN — Hydralazine HCl Tab 50 MG: 50 mg | ORAL | NDC 23155083401

## 2024-11-19 MED ADMIN — Aspirin Chew Tab 81 MG: 81 mg | ORAL | NDC 00904679480

## 2024-11-19 MED ADMIN — Lorazepam Tab 1 MG: 1 mg | NDC 13107008405

## 2024-11-19 MED ADMIN — Polyethylene Glycol 3350 Oral Packet 17 GM: 17 g | ORAL | NDC 00904693186

## 2024-11-19 MED ADMIN — Lidocaine Patch 5%: 2 | TRANSDERMAL | NDC 00603188010

## 2024-11-19 MED ADMIN — Rivaroxaban Tab 10 MG: 10 mg | ORAL | NDC 50458058001

## 2024-11-19 MED ADMIN — Nutritional Supplement Liquid: 1000 mL | NDC 7007462700

## 2024-11-19 NOTE — Progress Notes (Signed)
 Pt refused free water  flush, meds and CBG.

## 2024-11-19 NOTE — Plan of Care (Signed)
" °  Problem: Clinical Measurements: Goal: Ability to maintain clinical measurements within normal limits will improve Outcome: Progressing   Problem: Safety: Goal: Ability to remain free from injury will improve Outcome: Progressing   Problem: Skin Integrity: Goal: Risk for impaired skin integrity will decrease Outcome: Progressing   Problem: Education: Goal: Ability to describe self-care measures that may prevent or decrease complications (Diabetes Survival Skills Education) will improve Outcome: Progressing   Problem: Coping: Goal: Ability to adjust to condition or change in health will improve Outcome: Progressing   "
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
## 2024-11-20 LAB — GLUCOSE, CAPILLARY
Glucose-Capillary: 256 mg/dL — ABNORMAL HIGH (ref 70–99)
Glucose-Capillary: 140 mg/dL — ABNORMAL HIGH (ref 70–99)
Glucose-Capillary: 167 mg/dL — ABNORMAL HIGH (ref 70–99)
Glucose-Capillary: 187 mg/dL — ABNORMAL HIGH (ref 70–99)

## 2024-11-20 MED ADMIN — ORAL CARE MOUTH RINSE: 15 mL | OROMUCOSAL | NDC 99999080097

## 2024-11-20 MED ADMIN — Sennosides Tab 8.6 MG: 8.6 mg | ORAL | NDC 00904725280

## 2024-11-20 MED ADMIN — B-Complex w/ C & Folic Acid Tab 0.8 MG: 1 | ORAL | NDC 60258016001

## 2024-11-20 MED ADMIN — Water For Irrigation, Sterile Irrigation Soln: 50 mL | NDC 99999080061

## 2024-11-20 MED ADMIN — Quetiapine Fumarate Tab 25 MG: 25 mg | ORAL | NDC 99999010012

## 2024-11-20 MED ADMIN — Amlodipine Besylate Tab 10 MG (Base Equivalent): 10 mg | ORAL | NDC 00904637161

## 2024-11-20 MED ADMIN — Oxycodone HCl Tab 5 MG: 5 mg | ORAL | NDC 68084035411

## 2024-11-20 MED ADMIN — Nutritional Supplement Liquid: 237 mL | ORAL | NDC 4390018629

## 2024-11-20 MED ADMIN — Insulin Aspart Inj Soln 100 Unit/ML: 2 [IU] | SUBCUTANEOUS | NDC 73070010011

## 2024-11-20 MED ADMIN — Insulin Aspart Inj Soln 100 Unit/ML: 5 [IU] | SUBCUTANEOUS | NDC 73070010011

## 2024-11-20 MED ADMIN — Protein Liquid (Enteral): 60 mL | NDC 9468801446

## 2024-11-20 MED ADMIN — Hydralazine HCl Tab 50 MG: 50 mg | ORAL | NDC 23155083401

## 2024-11-20 MED ADMIN — Nutritional Supplement Pack: 1 | ORAL | NDC 5978166685

## 2024-11-20 MED ADMIN — Aspirin Chew Tab 81 MG: 81 mg | ORAL | NDC 63739043401

## 2024-11-20 MED ADMIN — Lorazepam Tab 1 MG: 1 mg | NDC 13107008405

## 2024-11-20 MED ADMIN — Polyethylene Glycol 3350 Oral Packet 17 GM: 17 g | ORAL | NDC 00904693186

## 2024-11-20 MED ADMIN — Lidocaine Patch 5%: 2 | TRANSDERMAL | NDC 00603188010

## 2024-11-20 MED ADMIN — Rivaroxaban Tab 10 MG: 10 mg | ORAL | NDC 50458058001

## 2024-11-20 MED ADMIN — Nutritional Supplement Liquid: 1000 mL | NDC 7007462700

## 2024-11-20 MED ADMIN — Insulin Aspart Inj Soln 100 Unit/ML: 0 [IU] | SUBCUTANEOUS | NDC 73070010011

## 2024-11-20 MED ADMIN — Darbepoetin Alfa Soln Prefilled Syringe 40 MCG/0.4ML: 40 ug | SUBCUTANEOUS | NDC 55513002101

## 2024-11-20 MED FILL — Nutritional Supplement Pack: 1.0000 | ORAL | Qty: 1 | Status: AC

## 2024-11-20 NOTE — Plan of Care (Signed)
" °  Problem: Clinical Measurements: Goal: Ability to maintain clinical measurements within normal limits will improve Outcome: Progressing   Problem: Safety: Goal: Ability to remain free from injury will improve Outcome: Progressing   Problem: Skin Integrity: Goal: Risk for impaired skin integrity will decrease Outcome: Progressing   Problem: Education: Goal: Ability to describe self-care measures that may prevent or decrease complications (Diabetes Survival Skills Education) will improve Outcome: Progressing   Problem: Coping: Goal: Ability to adjust to condition or change in health will improve Outcome: Progressing   "
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
## 2024-11-21 MED ADMIN — ORAL CARE MOUTH RINSE: 15 mL | OROMUCOSAL | NDC 99999080097

## 2024-11-21 MED ADMIN — Sennosides Tab 8.6 MG: 8.6 mg | ORAL | NDC 00904725261

## 2024-11-21 MED ADMIN — B-Complex w/ C & Folic Acid Tab 0.8 MG: 1 | ORAL | NDC 60258016001

## 2024-11-21 MED ADMIN — Water For Irrigation, Sterile Irrigation Soln: 50 mL | NDC 99999080061

## 2024-11-21 MED ADMIN — Quetiapine Fumarate Tab 25 MG: 25 mg | ORAL | NDC 99999010012

## 2024-11-21 MED ADMIN — Amlodipine Besylate Tab 10 MG (Base Equivalent): 10 mg | ORAL | NDC 00904637161

## 2024-11-21 MED ADMIN — Lorazepam Tab 1 MG: 1 mg | ORAL | NDC 13107008405

## 2024-11-21 MED ADMIN — Oxycodone HCl Tab 5 MG: 5 mg | ORAL | NDC 68084035411

## 2024-11-21 MED ADMIN — Nutritional Supplement Liquid: 237 mL | ORAL | NDC 7007468235

## 2024-11-21 MED ADMIN — Insulin Aspart Inj Soln 100 Unit/ML: 2 [IU] | SUBCUTANEOUS | NDC 73070010011

## 2024-11-21 MED ADMIN — Protein Liquid (Enteral): 60 mL | NDC 9468801446

## 2024-11-21 MED ADMIN — Hydralazine HCl Tab 50 MG: 50 mg | ORAL | NDC 23155083401

## 2024-11-21 MED ADMIN — Nutritional Supplement Pack: 1 | ORAL | NDC 5978166685

## 2024-11-21 MED ADMIN — Aspirin Chew Tab 81 MG: 81 mg | ORAL | NDC 00904679480

## 2024-11-21 MED ADMIN — Acetaminophen Tab 500 MG: 1000 mg | ORAL | NDC 50580045711

## 2024-11-21 MED ADMIN — Polyethylene Glycol 3350 Oral Packet 17 GM: 17 g | ORAL | NDC 00904693186

## 2024-11-21 MED ADMIN — Lidocaine Patch 5%: 2 | TRANSDERMAL | NDC 65162079104

## 2024-11-21 MED ADMIN — Rivaroxaban Tab 10 MG: 10 mg | ORAL | NDC 50458058001

## 2024-11-21 MED ADMIN — Nutritional Supplement Liquid: 1000 mL | NDC 7007462700

## 2024-11-22 MED ADMIN — ORAL CARE MOUTH RINSE: 15 mL | OROMUCOSAL | NDC 99999080097

## 2024-11-22 MED ADMIN — Sennosides Tab 8.6 MG: 8.6 mg | ORAL | NDC 00904725261

## 2024-11-22 MED ADMIN — B-Complex w/ C & Folic Acid Tab 0.8 MG: 1 | ORAL | NDC 60258016001

## 2024-11-22 MED ADMIN — Water For Irrigation, Sterile Irrigation Soln: 50 mL | NDC 99999080061

## 2024-11-22 MED ADMIN — Quetiapine Fumarate Tab 25 MG: 25 mg | ORAL | NDC 99999010012

## 2024-11-22 MED ADMIN — Amlodipine Besylate Tab 10 MG (Base Equivalent): 10 mg | ORAL | NDC 00904637161

## 2024-11-22 MED ADMIN — Lorazepam Tab 1 MG: 1 mg | ORAL | NDC 13107008405

## 2024-11-22 MED ADMIN — Oxycodone HCl Tab 5 MG: 5 mg | ORAL | NDC 68084035411

## 2024-11-22 MED ADMIN — Insulin Aspart Inj Soln 100 Unit/ML: 2 [IU] | SUBCUTANEOUS | NDC 73070010011

## 2024-11-22 MED ADMIN — Insulin Aspart Inj Soln 100 Unit/ML: 3 [IU] | SUBCUTANEOUS | NDC 73070010011

## 2024-11-22 MED ADMIN — Protein Liquid (Enteral): 60 mL | NDC 9468801446

## 2024-11-22 MED ADMIN — Hydralazine HCl Tab 50 MG: 50 mg | ORAL | NDC 23155083401

## 2024-11-22 MED ADMIN — Nutritional Supplement Pack: 1 | ORAL | NDC 5978166685

## 2024-11-22 MED ADMIN — Aspirin Chew Tab 81 MG: 81 mg | ORAL | NDC 00904679480

## 2024-11-22 MED ADMIN — Acetaminophen Tab 500 MG: 1000 mg | ORAL | NDC 50580045711

## 2024-11-22 MED ADMIN — Polyethylene Glycol 3350 Oral Packet 17 GM: 17 g | ORAL | NDC 00904693186

## 2024-11-22 MED ADMIN — Rivaroxaban Tab 10 MG: 10 mg | ORAL | NDC 50458058001

## 2024-11-22 MED ADMIN — Nutritional Supplement Liquid: 1000 mL | NDC 7007462700

## 2024-11-23 MED ADMIN — ORAL CARE MOUTH RINSE: 15 mL | OROMUCOSAL | NDC 99999080097

## 2024-11-23 MED ADMIN — Sennosides Tab 8.6 MG: 8.6 mg | ORAL | NDC 00904725261

## 2024-11-23 MED ADMIN — Water For Irrigation, Sterile Irrigation Soln: 50 mL | NDC 99999080061

## 2024-11-23 MED ADMIN — Quetiapine Fumarate Tab 25 MG: 25 mg | ORAL | NDC 99999010012

## 2024-11-23 MED ADMIN — Amlodipine Besylate Tab 10 MG (Base Equivalent): 10 mg | ORAL | NDC 00904637161

## 2024-11-23 MED ADMIN — Lorazepam Tab 1 MG: 1 mg | ORAL | NDC 13107008405

## 2024-11-23 MED ADMIN — Oxycodone HCl Tab 5 MG: 5 mg | ORAL | NDC 68084035411

## 2024-11-23 MED ADMIN — Nutritional Supplement Liquid: 237 mL | ORAL | NDC 7007468235

## 2024-11-23 MED ADMIN — Insulin Aspart Inj Soln 100 Unit/ML: 3 [IU] | SUBCUTANEOUS | NDC 73070010011

## 2024-11-23 MED ADMIN — Insulin Aspart Inj Soln 100 Unit/ML: 1 [IU] | SUBCUTANEOUS | NDC 73070010011

## 2024-11-23 MED ADMIN — Protein Liquid (Enteral): 60 mL | NDC 9468801446

## 2024-11-23 MED ADMIN — Hydralazine HCl Tab 50 MG: 50 mg | ORAL | NDC 23155083401

## 2024-11-23 MED ADMIN — Nutritional Supplement Pack: 1 | ORAL | NDC 5978166685

## 2024-11-23 MED ADMIN — Aspirin Chew Tab 81 MG: 81 mg | ORAL | NDC 00904679480

## 2024-11-23 MED ADMIN — Acetaminophen Tab 500 MG: 1000 mg | ORAL | NDC 50580045711

## 2024-11-23 MED ADMIN — Polyethylene Glycol 3350 Oral Packet 17 GM: 17 g | ORAL | NDC 00904693186

## 2024-11-23 MED ADMIN — Rivaroxaban Tab 10 MG: 10 mg | ORAL | NDC 50458058001

## 2024-11-24 ENCOUNTER — Encounter (HOSPITAL_COMMUNITY): Payer: Self-pay | Admitting: Emergency Medicine

## 2024-11-24 ENCOUNTER — Other Ambulatory Visit (HOSPITAL_COMMUNITY): Payer: Self-pay

## 2024-11-24 NOTE — Progress Notes (Signed)
 Patient had moderate protein calorie malnutrition
